# Patient Record
Sex: Male | Born: 1955
Health system: Southern US, Community
[De-identification: ages and names within clinical notes are randomized; demographics above are authoritative.]

## PROBLEM LIST (undated history)

## (undated) DIAGNOSIS — K219 Gastro-esophageal reflux disease without esophagitis: Secondary | ICD-10-CM

## (undated) DIAGNOSIS — J4489 Other specified chronic obstructive pulmonary disease: Secondary | ICD-10-CM

## (undated) DIAGNOSIS — IMO0002 Reserved for concepts with insufficient information to code with codable children: Secondary | ICD-10-CM

## (undated) DIAGNOSIS — E109 Type 1 diabetes mellitus without complications: Secondary | ICD-10-CM

## (undated) DIAGNOSIS — G8929 Other chronic pain: Secondary | ICD-10-CM

## (undated) DIAGNOSIS — I480 Paroxysmal atrial fibrillation: Secondary | ICD-10-CM

## (undated) DIAGNOSIS — Z8709 Personal history of other diseases of the respiratory system: Secondary | ICD-10-CM

## (undated) DIAGNOSIS — J449 Chronic obstructive pulmonary disease, unspecified: Secondary | ICD-10-CM

## (undated) DIAGNOSIS — I341 Nonrheumatic mitral (valve) prolapse: Secondary | ICD-10-CM

## (undated) DIAGNOSIS — R058 Other specified cough: Secondary | ICD-10-CM

## (undated) DIAGNOSIS — M1990 Unspecified osteoarthritis, unspecified site: Secondary | ICD-10-CM

## (undated) DIAGNOSIS — C679 Malignant neoplasm of bladder, unspecified: Secondary | ICD-10-CM

## (undated) DIAGNOSIS — H9190 Unspecified hearing loss, unspecified ear: Secondary | ICD-10-CM

## (undated) DIAGNOSIS — E11319 Type 2 diabetes mellitus with unspecified diabetic retinopathy without macular edema: Secondary | ICD-10-CM

## (undated) DIAGNOSIS — Z862 Personal history of diseases of the blood and blood-forming organs and certain disorders involving the immune mechanism: Secondary | ICD-10-CM

## (undated) DIAGNOSIS — I10 Essential (primary) hypertension: Secondary | ICD-10-CM

## (undated) DIAGNOSIS — J410 Simple chronic bronchitis: Secondary | ICD-10-CM

## (undated) DIAGNOSIS — M549 Dorsalgia, unspecified: Secondary | ICD-10-CM

## (undated) DIAGNOSIS — K275 Chronic or unspecified peptic ulcer, site unspecified, with perforation: Secondary | ICD-10-CM

## (undated) DIAGNOSIS — J439 Emphysema, unspecified: Secondary | ICD-10-CM

## (undated) DIAGNOSIS — Z8679 Personal history of other diseases of the circulatory system: Secondary | ICD-10-CM

## (undated) DIAGNOSIS — R05 Cough: Secondary | ICD-10-CM

## (undated) DIAGNOSIS — I739 Peripheral vascular disease, unspecified: Secondary | ICD-10-CM

## (undated) HISTORY — DX: Chronic or unspecified peptic ulcer, site unspecified, with perforation: K27.5

## (undated) HISTORY — DX: Emphysema, unspecified: J43.9

## (undated) HISTORY — DX: Other specified chronic obstructive pulmonary disease: J44.89

## (undated) HISTORY — DX: Essential (primary) hypertension: I10

## (undated) HISTORY — DX: Nonrheumatic mitral (valve) prolapse: I34.1

## (undated) HISTORY — DX: Paroxysmal atrial fibrillation: I48.0

## (undated) HISTORY — PX: TYMPANIC MEMBRANE REPAIR: SHX294

## (undated) HISTORY — PX: TONSILLECTOMY: SUR1361

---
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
## 1998-03-24 ENCOUNTER — Inpatient Hospital Stay (HOSPITAL_COMMUNITY): Admission: EM | Admit: 1998-03-24 | Discharge: 1998-03-27 | Payer: Self-pay | Admitting: Emergency Medicine

## 1998-04-05 ENCOUNTER — Encounter: Admission: RE | Admit: 1998-04-05 | Discharge: 1998-04-05 | Payer: Self-pay | Admitting: Family Medicine

## 1999-04-19 ENCOUNTER — Inpatient Hospital Stay (HOSPITAL_COMMUNITY): Admission: EM | Admit: 1999-04-19 | Discharge: 1999-04-21 | Payer: Self-pay | Admitting: Emergency Medicine

## 1999-04-19 ENCOUNTER — Encounter: Payer: Self-pay | Admitting: *Deleted

## 1999-04-20 ENCOUNTER — Encounter: Payer: Self-pay | Admitting: Sports Medicine

## 1999-07-15 ENCOUNTER — Encounter: Payer: Self-pay | Admitting: Emergency Medicine

## 1999-07-15 ENCOUNTER — Emergency Department (HOSPITAL_COMMUNITY): Admission: EM | Admit: 1999-07-15 | Discharge: 1999-07-15 | Payer: Self-pay | Admitting: Emergency Medicine

## 2000-05-12 ENCOUNTER — Encounter: Payer: Self-pay | Admitting: Emergency Medicine

## 2000-05-13 ENCOUNTER — Inpatient Hospital Stay (HOSPITAL_COMMUNITY): Admission: EM | Admit: 2000-05-13 | Discharge: 2000-05-16 | Payer: Self-pay | Admitting: Emergency Medicine

## 2000-05-15 ENCOUNTER — Encounter: Payer: Self-pay | Admitting: Sports Medicine

## 2000-10-17 ENCOUNTER — Emergency Department (HOSPITAL_COMMUNITY): Admission: EM | Admit: 2000-10-17 | Discharge: 2000-10-17 | Payer: Self-pay | Admitting: Emergency Medicine

## 2000-10-17 ENCOUNTER — Encounter: Payer: Self-pay | Admitting: Emergency Medicine

## 2001-05-08 ENCOUNTER — Emergency Department (HOSPITAL_COMMUNITY): Admission: EM | Admit: 2001-05-08 | Discharge: 2001-05-09 | Payer: Self-pay | Admitting: Emergency Medicine

## 2001-07-24 ENCOUNTER — Emergency Department (HOSPITAL_COMMUNITY): Admission: EM | Admit: 2001-07-24 | Discharge: 2001-07-24 | Payer: Self-pay | Admitting: Emergency Medicine

## 2001-07-24 ENCOUNTER — Encounter: Payer: Self-pay | Admitting: Emergency Medicine

## 2003-04-17 ENCOUNTER — Emergency Department (HOSPITAL_COMMUNITY): Admission: AD | Admit: 2003-04-17 | Discharge: 2003-04-17 | Payer: Self-pay | Admitting: Emergency Medicine

## 2003-04-18 ENCOUNTER — Inpatient Hospital Stay (HOSPITAL_COMMUNITY): Admission: EM | Admit: 2003-04-18 | Discharge: 2003-04-19 | Payer: Self-pay | Admitting: Emergency Medicine

## 2009-04-27 ENCOUNTER — Observation Stay (HOSPITAL_COMMUNITY): Admission: EM | Admit: 2009-04-27 | Discharge: 2009-04-29 | Payer: Self-pay | Admitting: Emergency Medicine

## 2010-05-30 LAB — COMPREHENSIVE METABOLIC PANEL
ALT: 12 U/L (ref 0–53)
ALT: 14 U/L (ref 0–53)
AST: 20 U/L (ref 0–37)
Albumin: 2.9 g/dL — ABNORMAL LOW (ref 3.5–5.2)
Albumin: 3 g/dL — ABNORMAL LOW (ref 3.5–5.2)
Alkaline Phosphatase: 57 U/L (ref 39–117)
Alkaline Phosphatase: 61 U/L (ref 39–117)
BUN: 10 mg/dL (ref 6–23)
BUN: 11 mg/dL (ref 6–23)
CO2: 23 mEq/L (ref 19–32)
Calcium: 7.5 mg/dL — ABNORMAL LOW (ref 8.4–10.5)
Chloride: 103 mEq/L (ref 96–112)
Chloride: 105 mEq/L (ref 96–112)
Creatinine, Ser: 0.86 mg/dL (ref 0.4–1.5)
Creatinine, Ser: 0.97 mg/dL (ref 0.4–1.5)
GFR calc Af Amer: >60 mL/min (ref >60)
GFR calc non Af Amer: >60 mL/min (ref >60)
Glucose, Bld: 119 mg/dL — ABNORMAL HIGH (ref 70–99)
Glucose, Bld: 266 mg/dL — ABNORMAL HIGH (ref 70–99)
Potassium: 3.6 mEq/L (ref 3.5–5.1)
Potassium: 4.2 mEq/L (ref 3.5–5.1)
Sodium: 129 mEq/L — ABNORMAL LOW (ref 135–145)
Sodium: 134 mEq/L — ABNORMAL LOW (ref 135–145)
Total Bilirubin: 0.7 mg/dL (ref 0.3–1.2)
Total Bilirubin: 0.8 mg/dL (ref 0.3–1.2)

## 2010-05-30 LAB — GLUCOSE, CAPILLARY
Glucose-Capillary: 136 mg/dL — ABNORMAL HIGH (ref 70–99)
Glucose-Capillary: 171 mg/dL — ABNORMAL HIGH (ref 70–99)
Glucose-Capillary: 203 mg/dL — ABNORMAL HIGH (ref 70–99)
Glucose-Capillary: 216 mg/dL — ABNORMAL HIGH (ref 70–99)
Glucose-Capillary: 227 mg/dL — ABNORMAL HIGH (ref 70–99)
Glucose-Capillary: 246 mg/dL — ABNORMAL HIGH (ref 70–99)
Glucose-Capillary: 270 mg/dL — ABNORMAL HIGH (ref 70–99)
Glucose-Capillary: 277 mg/dL — ABNORMAL HIGH (ref 70–99)
Glucose-Capillary: 338 mg/dL — ABNORMAL HIGH (ref 70–99)

## 2010-05-30 LAB — BASIC METABOLIC PANEL
BUN: 11 mg/dL (ref 6–23)
CO2: 19 mEq/L (ref 19–32)
Chloride: 103 mEq/L (ref 96–112)
Creatinine, Ser: 0.88 mg/dL (ref 0.4–1.5)
Glucose, Bld: 249 mg/dL — ABNORMAL HIGH (ref 70–99)
Potassium: 4.1 mEq/L (ref 3.5–5.1)

## 2010-05-30 LAB — URINALYSIS, ROUTINE W REFLEX MICROSCOPIC
Nitrite: NEGATIVE
Protein, ur: NEGATIVE mg/dL
Specific Gravity, Urine: 1.022 (ref 1.005–1.030)
Urobilinogen, UA: 0.2 mg/dL (ref 0.0–1.0)

## 2010-05-30 LAB — URINE MICROSCOPIC-ADD ON

## 2010-05-30 LAB — CBC
HCT: 39.6 % (ref 39.0–52.0)
MCHC: 35.5 g/dL (ref 30.0–36.0)
MCV: 92.4 fL (ref 78.0–100.0)
Platelets: 146 10*3/uL — ABNORMAL LOW (ref 150–400)
RDW: 14.1 % (ref 11.5–15.5)

## 2010-05-30 LAB — POCT I-STAT, CHEM 8
BUN: 15 mg/dL (ref 6–23)
Calcium, Ion: 1.08 mmol/L — ABNORMAL LOW (ref 1.12–1.32)
Chloride: 104 mEq/L (ref 96–112)
Creatinine, Ser: 1 mg/dL (ref 0.4–1.5)

## 2010-05-30 LAB — URINE DRUGS OF ABUSE SCREEN W ALC, ROUTINE (REF LAB)
Barbiturate Quant, Ur: NEGATIVE
Benzodiazepines.: NEGATIVE
Ethyl Alcohol: 5 mg/dL (ref <10)
Opiate Screen, Urine: NEGATIVE
Phencyclidine (PCP): NEGATIVE
Propoxyphene: NEGATIVE

## 2010-05-30 LAB — HEMOGLOBIN A1C: Hgb A1c MFr Bld: 7.9 % — ABNORMAL HIGH (ref 4.6–6.1)

## 2010-05-30 LAB — KETONES, QUALITATIVE

## 2010-05-30 LAB — RAPID STREP SCREEN (MED CTR MEBANE ONLY): Streptococcus, Group A Screen (Direct): NEGATIVE

## 2010-07-25 NOTE — Discharge Summary (Signed)
[place]. [facility]  Patient:    Thomas Sandoval, Thomas Sandoval                    MRN: [id number] Adm. Date:  [id number] Disc. Date: [id number] Attending:  Garnette Scheuermann Dictator:   Lyndee Leo. Janey Greaser, M.D. CC:         Dr. Ilean Skill                           Discharge Summary  ADMITTING DIAGNOSES: 1. Bronchitis. 2. Dehydration. 3. Diabetes mellitus. 4. Glucosuria.  HOSPITAL COURSE:  The patient was admitted and started on IV fluids and given aggressive diuresis.  The patient was also continued on his Biaxin which he had  initially received from his primary.  As an outpatient, he had been on it for the last five days.  The patient slowly did better over hospital course.  He had a chest x-ray which revealed some mild cardiac enlargement and mild vascular congestion with no edema or infiltrate.  The patient had very high CBGs over the hospital course ranging in the 300-400 range and was placed on sliding scale insulin.  The patient did have seem to have very tight control of his blood sugars at home.  The patient also had an x-ray performed of his left foot which revealed a fracture of the fifth toe specifically the proximal phalanx.  Labs over hospital course were within normal range with exception of the glucose.  The patient at o time revealed any signs of acidosis and CO2 remained within normal limits.  The  patient was also placed on a nicotine patch while in house for his tobacco abuse. The patient left before diabetic counseling could be performed as well as any appointments for outpatient clinic.  DISCHARGE DIAGNOSES: 1. Diabetes. 2. Dehydration. 3. Bronchitis/chronic obstructive pulmonary disease exacerbation.  FOLLOWUP:  The patient needs to be seen by a primary physician for continued management of his diabetes as well as a recheck from hospitalization.  However, the patient left before appointments were able to be made and before  diabetic counseling could be performed.  We have called the primary physicians office and left a message to left Dr. Ilean Skill know that he left early. DD:  04/21/99 TD:  04/22/99 Job: [id number]

## 2010-07-25 NOTE — Discharge Summary (Signed)
NAME:  Thomas Sandoval, BERTZ                       ACCOUNT NO.:  [id number]   MEDICAL RECORD NO.:  [id number]                   PATIENT TYPE:  INP   LOCATION:  6731                                 FACILITY:  [facility]   PHYSICIAN:  Leighton Roach McDiarmid, M.D.             DATE OF BIRTH:  Oct 11, 1955   DATE OF ADMISSION:  04/17/2003  DATE OF DISCHARGE:  04/19/2003                                 DISCHARGE SUMMARY   PRIMARY CARE PHYSICIAN:  Dr. Mamie Laurel at [facility].   DISCHARGE DIAGNOSES:  1. Type 2 diabetes mellitus.  2. Chronic obstructive pulmonary disease exacerbation.  3. Tobacco abuse.  4. Resolving pneumonia.  5. Dehydration.   CONSULTS:  No consults during this hospitalization.   PROCEDURE:  Chest x-ray on April 18, 2003 showed chronic changes, no  definite active process.   DISCHARGE INSTRUCTIONS:  MEDICATIONS:  1. Avelox 400 mg one p.o. q.d. for a total of 7-day course. This was started     with the PCP.  2. Prilosec OTC 20 mg one p.o. q.d., especially when taking Motrin.  3. Motrin 600 mg one p.o. q.6h. p.r.n. for pain.  4. Insulin Humulin Lente 44 units q.a.m. as taken before, Humulin Regular on     a sliding scale. The patient is to talk with Murlean Hark about changing     to Lantus insulin or other changes to his insulin regimen.  5. Albuterol inhaler 2 puffs q.6h. for 3 days, then 2 puffs q.2h. p.r.n. for     shortness of breath.  6. Atrovent MDI with spacer q.6h. for 3 days.  7. Robitussin cough syrup p.r.n.   DIET:  He is to limit carbohydrates.   SPECIAL INSTRUCTIONS:  He is to stop smoking.   FOLLOW UP:  The patient is to call [facility] for  appointment in two weeks post discharge.   BRIEF HISTORY AND HOSPITAL COURSE:  1. Thomas Sandoval is a 55 year old white male with known IDDM who presents to     the ED for the second time in 24 hours with complaint of vomiting, not     relieved by Phenergan and IV fluids. He is  undergoing current treatment     for pneumonia diagnosed by his primary care physician two days prior to     admission and treated with Avelox. He was admitted for dehydration     secondary to his intractable nausea and vomiting. His laboratory values     with a creatinine of 1.2 increased from baseline showed a prerenal     picture, nausea and vomiting; therefore dehydration resolved with Zofran     as well as IV fluid, and in 6 to 8 hours, he was taking p.o. without     difficulty.  2. Pneumonia. Diagnosed per history. Records from PCP showed question of a     nodule versus atelectasis at the right lung base for which recommended  a     repeat x-ray. He was placed on Avelox and continued on that in the     hospital and also symptomatic treatment. He remained afebrile but did     have a slight increased white blood cell count at 11.4 with 92%     neutrophils.  3. Insulin-dependent diabetes mellitus. The patient has history of very poor     compliance as well as extreme resistance to change in his current     regimen. He is rarely if ever been admitted with DKA, and on admission,     he did have positive glycosuria but was not acidotic with a CBG of 216     after not taking his insulin for two days. He had not been eating. Did     have mild urinary ketones of greater than 80. Hemoglobin A1c was 7.4. His     home regimen was began on hospital day #2, and also discussed at length,     his regimen was not changed as this was felt to be better done by the     primary care physician who has obviously discussed this with him at     length in the past. His C peptide incidentally was less than 0.1 which     does not explain his lack of episodes of DKA or complications.  4. Tobacco abuse. Patient with long history of smoking. Discussed at length     his need to quit. He is very resistant and will surely discuss this     further with his PCP.  5. Chronic obstructive pulmonary disease exacerbation.  Admission O2     saturations 93% on room air with wheezes and rhonchi on exam. Although     chest x-ray was negative, he was placed on pulmonary toilet with     albuterol and Atrovent metered dose inhalers, and by hospital day #2, he     was improving, continued on Avelox, and was not began on oral steroids.     On discharge, the patient was stable, afebrile, saturating 95% greater on     room air, taking p.o. without nausea or vomiting, continued with cough     but felt much improved and stable to go home. Therefore was discharged.   DISCHARGE LABORATORY DATA:  WBC 8.8, hemoglobin 12.9, hematocrit 37.9,  platelets 203. Sodium 141, potassium 3.5, chloride 109, CO2 26, BUN 17,  creatinine 1.0, glucose 65, calcium 8.7.      Ace Gins, MD                        Etta Grandchild, M.D.    JS/MEDQ  D:  06/14/2003  T:  06/15/2003  Job:  [id number]   cc:   [facility]

## 2010-07-25 NOTE — Discharge Summary (Signed)
[place]. [facility]  Patient:    Thomas Sandoval, Thomas Sandoval                    MRN: [id number] Adm. Date:  [id number] Disc. Date: [id number] Attending:  Garnette Scheuermann Dictator:   Thomas Sandoval, M.D. CC:         Dr. Christell Constant and Birdena Jubilee, [place] [facility]   Discharge Summary  ADMITTING DIAGNOSES: 1. Viral gastroenteritis. 2. Acute bronchitis. 3. Hyperglycemia.  DISCHARGE DIAGNOSES: 1. Viral gastroenteritis. 2. Acute bronchitis. 3. Hyperglycemia.  CHRONIC PROBLEM LIST: 1. Diabetes mellitus, type 1. 2. Chronic obstructive pulmonary disease (chronic bronchitis).  DISCHARGE MEDICATIONS: 1. Tequin 400 mg by mouth daily for 5 days. 2. Phenergan 12.5 mg by mouth every 6 hours as needed for nausea. 3. Guaifenesin 600 mg by mouth b.i.d. as needed for cough. 4. Insulin as per home regimen prior to admission.  SERVICE:  Conservation officer, historic buildings.  RESIDENT:  Dr. Lenard Galloway.  CONSULTS:  None.  PROCEDURES:  None.  HISTORY AND PHYSICAL:  For complete H&P, please see resident H&P in chart. Briefly, this was a 55 year old white man with diagnosis of type 1 diabetes mellitus, who presented with approximately one day of nausea and vomiting and inability to take anything by mouth.  He had had several members of his family with the same illness over the last week.  He apparently controls his diabetes with approximately 45 units of NPH in the morning and adjusts his regular insulin dose throughout the day according to his activity level.  He also takes 10 units of Lenta, also according to his activity level.  He does not get regular follow up with a doctor but has been admitted for DKA.  PRESENTATION LABORATORY DATA:  Sodium 139, potassium 3.9, chloride 104, bicarb 29, BUN 11, creatinine 0.9, glucose 229, hemoglobin 14.  Serum ketones were negative.  Urinalysis showed greater than 1000 glucose, greater than 80 ketones, and was  otherwise negative.  Chest x-ray showed stable coarse interstitial lung disease.  This 55 year old male with type 1 diabetes was admitted for probable viral gastroenteritis and IV rehydration.  HOSPITAL COURSE: #1 - GASTROENTERITIS:  The patient continued to have severe nausea and vomiting throughout the first 1-2 hospital days.  He received aggressive IV fluid rehydration with normal saline.  On the second hospital day, he did have several loose stools but did not have any frank diarrhea throughout the hospitalization.  He was treated with Phenergan IV for nausea, but this seemed to sedate him too much, so this was changed to Vistaril.  The patient did begin to gradually be able to take in fluids by mouth without emesis and on the second to last hospital day, began taking a regular diet without problem. His energy level was markedly increased, and he desired to go home.  #2 - DIABETES MELLITUS, TYPE 1:  The patient has no history of DKA.  His hemoglobin A1C was not known, but he reports that the last one taken was not very good because "I was sick."  Glucose at presentation was 229 and bicarb was 29.  Serum ketones were negative.  Serum ketones were checked daily throughout this hospitalization and were positive on the third hospital day, but the bicarb did not get low.  He was controlled with sliding scale insulin and then briefly was placed on regular scheduled insulin at 10 units of NPH b.i.d.  His blood sugars ranged from 129 to 280 during  hospitalization.  He was discharged home with instructions to have close follow-up with the nurse practitioner, Birdena Jubilee, at [facility], as this is the person that is familiar with his diabetes management in the past, although this has been infrequent.  He said that he would get an appointment with her within a week of discharge.  It was also recommended that he start an aspirin per day and also be started on an ACE  inhibitor in the future.  #3 - ACUTE BRONCHITIS:  Upon presentation, the patient did have a significant productive cough and was febrile.  The fever was attributed to his viral gastroenteritis, but he did have a lung exam consistent with acute bronchitis during this hospitalization and was started on Tequin 400 mg by mouth daily. A chest x-ray on the third hospital day did not show any significant changes from admission chest x-ray.  He was discharged home on Tequin to finish a 7-day course of antibiotics.  DISPOSITION:  The patient was discharged home in stable and improved condition and on the previously mentioned discharge medications.  He was instructed to follow up with [facility] with Dr. Christell Constant or with Birdena Jubilee within the next week.  He was also instructed to do his best to quit smoking and to have closer follow-up with physicians or nurse practitioners regarding management of his type 1 diabetes. DD:  05/16/00 TD:  05/17/00 Job: [id number]

## 2011-02-07 DIAGNOSIS — Z862 Personal history of diseases of the blood and blood-forming organs and certain disorders involving the immune mechanism: Secondary | ICD-10-CM

## 2011-02-07 DIAGNOSIS — Z8679 Personal history of other diseases of the circulatory system: Secondary | ICD-10-CM

## 2011-02-07 HISTORY — DX: Personal history of diseases of the blood and blood-forming organs and certain disorders involving the immune mechanism: Z86.2

## 2011-02-07 HISTORY — DX: Personal history of other diseases of the circulatory system: Z86.79

## 2011-02-15 ENCOUNTER — Inpatient Hospital Stay (HOSPITAL_COMMUNITY)
Admission: EM | Admit: 2011-02-15 | Discharge: 2011-02-24 | Discharge status: Against Medical Advice | Payer: BC Managed Care – PPO | Source: Home / Self Care | Attending: Cardiology | Admitting: Cardiology

## 2011-02-15 ENCOUNTER — Emergency Department (HOSPITAL_COMMUNITY): Payer: BC Managed Care – PPO

## 2011-02-15 ENCOUNTER — Other Ambulatory Visit: Payer: Self-pay

## 2011-02-15 ENCOUNTER — Encounter: Payer: Self-pay | Admitting: Emergency Medicine

## 2011-02-15 DIAGNOSIS — J449 Chronic obstructive pulmonary disease, unspecified: Secondary | ICD-10-CM | POA: Diagnosis present

## 2011-02-15 DIAGNOSIS — J111 Influenza due to unidentified influenza virus with other respiratory manifestations: Secondary | ICD-10-CM | POA: Diagnosis not present

## 2011-02-15 DIAGNOSIS — E10649 Type 1 diabetes mellitus with hypoglycemia without coma: Secondary | ICD-10-CM

## 2011-02-15 DIAGNOSIS — J8410 Pulmonary fibrosis, unspecified: Secondary | ICD-10-CM | POA: Diagnosis present

## 2011-02-15 DIAGNOSIS — IMO0001 Reserved for inherently not codable concepts without codable children: Secondary | ICD-10-CM | POA: Diagnosis present

## 2011-02-15 DIAGNOSIS — B370 Candidal stomatitis: Secondary | ICD-10-CM | POA: Diagnosis not present

## 2011-02-15 DIAGNOSIS — I4892 Unspecified atrial flutter: Principal | ICD-10-CM | POA: Diagnosis present

## 2011-02-15 DIAGNOSIS — J4489 Other specified chronic obstructive pulmonary disease: Secondary | ICD-10-CM | POA: Diagnosis present

## 2011-02-15 DIAGNOSIS — F17200 Nicotine dependence, unspecified, uncomplicated: Secondary | ICD-10-CM | POA: Diagnosis present

## 2011-02-15 DIAGNOSIS — E871 Hypo-osmolality and hyponatremia: Secondary | ICD-10-CM | POA: Diagnosis not present

## 2011-02-15 DIAGNOSIS — J441 Chronic obstructive pulmonary disease with (acute) exacerbation: Secondary | ICD-10-CM

## 2011-02-15 DIAGNOSIS — D559 Anemia due to enzyme disorder, unspecified: Secondary | ICD-10-CM | POA: Diagnosis present

## 2011-02-15 DIAGNOSIS — E1010 Type 1 diabetes mellitus with ketoacidosis without coma: Secondary | ICD-10-CM | POA: Diagnosis not present

## 2011-02-15 DIAGNOSIS — R1110 Vomiting, unspecified: Secondary | ICD-10-CM | POA: Diagnosis present

## 2011-02-15 DIAGNOSIS — J154 Pneumonia due to other streptococci: Secondary | ICD-10-CM | POA: Diagnosis present

## 2011-02-15 DIAGNOSIS — E119 Type 2 diabetes mellitus without complications: Secondary | ICD-10-CM

## 2011-02-15 DIAGNOSIS — Z794 Long term (current) use of insulin: Secondary | ICD-10-CM

## 2011-02-15 DIAGNOSIS — E876 Hypokalemia: Secondary | ICD-10-CM | POA: Diagnosis not present

## 2011-02-15 DIAGNOSIS — J069 Acute upper respiratory infection, unspecified: Secondary | ICD-10-CM

## 2011-02-15 DIAGNOSIS — J13 Pneumonia due to Streptococcus pneumoniae: Secondary | ICD-10-CM | POA: Diagnosis present

## 2011-02-15 DIAGNOSIS — IMO0002 Reserved for concepts with insufficient information to code with codable children: Secondary | ICD-10-CM

## 2011-02-15 DIAGNOSIS — R0902 Hypoxemia: Secondary | ICD-10-CM

## 2011-02-15 DIAGNOSIS — D649 Anemia, unspecified: Secondary | ICD-10-CM

## 2011-02-15 DIAGNOSIS — E8779 Other fluid overload: Secondary | ICD-10-CM | POA: Diagnosis not present

## 2011-02-15 DIAGNOSIS — I484 Atypical atrial flutter: Secondary | ICD-10-CM

## 2011-02-15 DIAGNOSIS — J3489 Other specified disorders of nose and nasal sinuses: Secondary | ICD-10-CM | POA: Diagnosis present

## 2011-02-15 LAB — BASIC METABOLIC PANEL
CO2: 20 mEq/L (ref 19–32)
Chloride: 100 mEq/L (ref 96–112)
Creatinine, Ser: 0.95 mg/dL (ref 0.50–1.35)
GFR calc Af Amer: >90 mL/min (ref >90)
Potassium: 4.1 mEq/L (ref 3.5–5.1)
Sodium: 134 mEq/L — ABNORMAL LOW (ref 135–145)

## 2011-02-15 LAB — CBC
Platelets: 197 10*3/uL (ref 150–400)
RBC: 4.84 MIL/uL (ref 4.22–5.81)
RDW: 13.1 % (ref 11.5–15.5)
WBC: 8.8 10*3/uL (ref 4.0–10.5)

## 2011-02-15 MED ORDER — ONDANSETRON HCL 8 MG PO TABS: 8.0000 mg | ORAL_TABLET | Freq: Three times a day (TID) | ORAL | Status: AC | PRN

## 2011-02-15 MED ORDER — IPRATROPIUM BROMIDE 0.02 % IN SOLN
0.5000 mg | Freq: Once | RESPIRATORY_TRACT | Status: AC
  Administered 2011-02-15: 0.5 mg via RESPIRATORY_TRACT
  Filled 2011-02-15: qty 2.5

## 2011-02-15 MED ORDER — ONDANSETRON HCL 4 MG/2ML IJ SOLN
4.0000 mg | Freq: Once | INTRAMUSCULAR | Status: AC
  Administered 2011-02-15: 4 mg via INTRAVENOUS
  Filled 2011-02-15: qty 2

## 2011-02-15 MED ORDER — HYDROCOD POLST-CHLORPHEN POLST 10-8 MG/5ML PO LQCR
5.0000 mL | Freq: Once | ORAL | Status: AC
  Administered 2011-02-15: 5 mL via ORAL
  Filled 2011-02-15: qty 5

## 2011-02-15 MED ORDER — SODIUM CHLORIDE 0.9 % IV BOLUS (SEPSIS)
1000.0000 mL | Freq: Once | INTRAVENOUS | Status: AC
  Administered 2011-02-15: 1000 mL via INTRAVENOUS

## 2011-02-15 MED ORDER — METOCLOPRAMIDE HCL 5 MG/ML IJ SOLN
10.0000 mg | Freq: Once | INTRAMUSCULAR | Status: AC
  Administered 2011-02-15: 10 mg via INTRAVENOUS
  Filled 2011-02-15: qty 2

## 2011-02-15 MED ORDER — ALBUTEROL SULFATE (5 MG/ML) 0.5% IN NEBU
5.0000 mg | INHALATION_SOLUTION | Freq: Once | RESPIRATORY_TRACT | Status: AC
  Administered 2011-02-15: 5 mg via RESPIRATORY_TRACT
  Filled 2011-02-15: qty 1

## 2011-02-15 MED ORDER — ALBUTEROL SULFATE HFA 108 (90 BASE) MCG/ACT IN AERS: 2.0000 | INHALATION_SPRAY | RESPIRATORY_TRACT | Status: DC | PRN

## 2011-02-15 NOTE — ED Notes (Signed)
Patient states yesterday had diarrhea but it stopped today today woke up vomiting that was unable to control with home remedies

## 2011-02-15 NOTE — ED Provider Notes (Signed)
History     CSN: [id number] Arrival date & time: 02/15/2011  4:54 PM   First MD Initiated Contact with Patient 02/15/11 1735      Chief Complaint  Patient presents with  . Emesis    (Consider location/radiation/quality/duration/timing/severity/associated sxs/prior treatment) Patient is a 55 y.o. male presenting with vomiting. The history is provided by the patient.  Emesis  Associated symptoms include a fever. Pertinent negatives include no abdominal pain and no headaches.  pt hx iddm, ??copd, c/o prod cough, fever, and nv onset 2-3 days ago. Acute onset, episodic, no specific exacerbating or allev factors. ?hx copd. Smoker. Uses inhaler prn. No prior admit re copd/asthma. +nasal congestion. No sinus pain. No headache. +body aches. No known ill contacts. Sob/wheezing. No chest pain. Scratchy throat, no trouble swallowing. States hasnt kept anything down all day. Did have bm today, sl loose. No abd pain. +fever. No chills/sweats.   Past Medical History  Diagnosis Date  . Diabetes mellitus     History reviewed. No pertinent past surgical history.  No family history on file.  History  Substance Use Topics  . Smoking status: Current Everyday Smoker  . Smokeless tobacco: Not on file  . Alcohol Use: No      Review of Systems  Constitutional: Positive for fever.  HENT: Negative for neck pain and neck stiffness.   Eyes: Negative for redness.  Respiratory: Positive for shortness of breath and wheezing.   Cardiovascular: Negative for chest pain and leg swelling.  Gastrointestinal: Positive for vomiting. Negative for abdominal pain.  Genitourinary: Negative for flank pain.  Musculoskeletal: Negative for back pain.  Skin: Negative for rash.  Neurological: Negative for headaches.  Hematological: Does not bruise/bleed easily.  Psychiatric/Behavioral: Negative for confusion.    Allergies  Review of patient's allergies indicates no known allergies.  Home Medications   Current  Outpatient Rx  Name Route Sig Dispense Refill  . FLUTICASONE-SALMETEROL 250-50 MCG/DOSE IN AEPB Inhalation Inhale 1 puff into the lungs every 12 (twelve) hours.      . INSULIN ASPART 100 UNIT/ML [place] SOLN Subcutaneous Inject 2-8 Units into the skin 3 (three) times daily before meals. Take as directed per sliding scale     . INSULIN GLARGINE 100 UNIT/ML  SOLN Subcutaneous Inject 32 Units into the skin at bedtime.        BP 125/60  Pulse 96  Temp(Src) 97.8 F (36.6 C) (Oral)  Resp 18  SpO2 96%  Physical Exam  Nursing note and vitals reviewed. Constitutional: He is oriented to person, place, and time. He appears well-developed and well-nourished. No distress.  HENT:  Head: Atraumatic.  Nose: Nose normal.  Mouth/Throat: Oropharynx is clear and moist.  Eyes: Pupils are equal, round, and reactive to light.  Neck: Neck supple. No tracheal deviation present.       No stiffness or rigidity  Cardiovascular: Normal rate, regular rhythm and intact distal pulses.  Exam reveals no gallop and no friction rub.   No murmur heard. Pulmonary/Chest: Effort normal. No accessory muscle usage. No respiratory distress. He has wheezes.  Abdominal: Soft. Bowel sounds are normal. He exhibits no distension and no mass. There is no tenderness. There is no rebound and no guarding.  Musculoskeletal: Normal range of motion. He exhibits no edema and no tenderness.  Neurological: He is alert and oriented to person, place, and time.  Skin: Skin is warm and dry.  Psychiatric: He has a normal mood and affect.    ED Course  Procedures (including  critical care time)  Results for orders placed during the hospital encounter of 02/15/11  CBC      Component Value Range   WBC 8.8  4.0 - 10.5 (K/uL)   RBC 4.84  4.22 - 5.81 (MIL/uL)   Hemoglobin 14.6  13.0 - 17.0 (g/dL)   HCT 16.1  09.6 - 04.5 (%)   MCV 90.9  78.0 - 100.0 (fL)   MCH 30.2  26.0 - 34.0 (pg)   MCHC 33.2  30.0 - 36.0 (g/dL)   RDW 40.9  81.1 - 91.4 (%)     Platelets 197  150 - 400 (K/uL)  BASIC METABOLIC PANEL      Component Value Range   Sodium 134 (*) 135 - 145 (mEq/L)   Potassium 4.1  3.5 - 5.1 (mEq/L)   Chloride 100  96 - 112 (mEq/L)   CO2 20  19 - 32 (mEq/L)   Glucose, Bld 213 (*) 70 - 99 (mg/dL)   BUN 9  6 - 23 (mg/dL)   Creatinine, Ser 7.82  0.50 - 1.35 (mg/dL)   Calcium 9.3  8.4 - 95.6 (mg/dL)   GFR calc non Af Amer >90  >90 (mL/min)   GFR calc Af Amer >90  >90 (mL/min)   Dg Chest 2 View  02/15/2011  *RADIOLOGY REPORT*  Clinical Data: Cough and fever.  Shortness of breath.  Nausea vomiting.  CHEST - 2 VIEW  Comparison:  04/27/2009  Findings:  The heart size and mediastinal contours are within normal limits.  Both lungs are clear.  The visualized skeletal structures are unremarkable.  IMPRESSION: No active cardiopulmonary disease.  Original Report Authenticated By: Danae Orleans, M.D.       MDM  Iv ns bolus. zofran iv. Albuterol and atrovent neb tx. Labs. Cxr.   Pt w recurret nv. Clear. Wheezing improved w neb tx. No increased wob. Recheck abd soft nt. zofran iv. Additional ivf. Repeat vitals, response to po fluids/fluid challenge pending. Signed out to PA Dammen/MD Plunkett that if improved, tolerating po fluids, may d/c w probable viral illness/flu, close pcp follow up.  If recurrent/persistent nv, or sob/inc wheezing, would consider admission.        Suzi Roots, MD 02/15/11 2006

## 2011-02-15 NOTE — ED Notes (Signed)
C/o nausea, vomiting, headache, productive cough with clear phlegm, generalized body aches, sore throat, and chills x 3-4 days.

## 2011-02-16 ENCOUNTER — Other Ambulatory Visit: Payer: Self-pay

## 2011-02-16 ENCOUNTER — Encounter (HOSPITAL_COMMUNITY): Payer: Self-pay | Admitting: General Practice

## 2011-02-16 DIAGNOSIS — I4892 Unspecified atrial flutter: Principal | ICD-10-CM

## 2011-02-16 DIAGNOSIS — R112 Nausea with vomiting, unspecified: Secondary | ICD-10-CM

## 2011-02-16 DIAGNOSIS — E10649 Type 1 diabetes mellitus with hypoglycemia without coma: Secondary | ICD-10-CM

## 2011-02-16 DIAGNOSIS — I484 Atypical atrial flutter: Secondary | ICD-10-CM

## 2011-02-16 DIAGNOSIS — J069 Acute upper respiratory infection, unspecified: Secondary | ICD-10-CM

## 2011-02-16 DIAGNOSIS — E119 Type 2 diabetes mellitus without complications: Secondary | ICD-10-CM

## 2011-02-16 LAB — PROTIME-INR: INR: 1.2 (ref 0.00–1.49)

## 2011-02-16 LAB — CBC
HCT: 40.9 % (ref 39.0–52.0)
Hemoglobin: 13.6 g/dL (ref 13.0–17.0)
RDW: 13.3 % (ref 11.5–15.5)
WBC: 13.5 10*3/uL — ABNORMAL HIGH (ref 4.0–10.5)

## 2011-02-16 LAB — INFLUENZA PANEL BY PCR (TYPE A & B): Influenza B By PCR: NEGATIVE

## 2011-02-16 LAB — GLUCOSE, CAPILLARY
Glucose-Capillary: 301 mg/dL — ABNORMAL HIGH (ref 70–99)
Glucose-Capillary: 367 mg/dL — ABNORMAL HIGH (ref 70–99)
Glucose-Capillary: 248 mg/dL — ABNORMAL HIGH (ref 70–99)

## 2011-02-16 LAB — CREATININE, SERUM
GFR calc Af Amer: >90 mL/min (ref >90)
GFR calc non Af Amer: >90 mL/min (ref >90)

## 2011-02-16 LAB — HEPATIC FUNCTION PANEL
ALT: 11 U/L (ref 0–53)
Bilirubin, Direct: 0.2 mg/dL (ref 0.0–0.3)
Indirect Bilirubin: 0.6 mg/dL (ref 0.3–0.9)

## 2011-02-16 LAB — POCT I-STAT TROPONIN I: Troponin i, poc: 0.08 ng/mL (ref 0.00–0.08)

## 2011-02-16 MED ORDER — ACETAMINOPHEN 325 MG PO TABS
650.0000 mg | ORAL_TABLET | Freq: Four times a day (QID) | ORAL | Status: DC | PRN
  Administered 2011-02-18 – 2011-02-22 (×3): 650 mg via ORAL
  Filled 2011-02-16 (×3): qty 2

## 2011-02-16 MED ORDER — ACETAMINOPHEN 650 MG RE SUPP: 650.0000 mg | Freq: Four times a day (QID) | RECTAL | Status: DC | PRN

## 2011-02-16 MED ORDER — HYDROCOD POLST-CHLORPHEN POLST 10-8 MG/5ML PO LQCR
5.0000 mL | Freq: Two times a day (BID) | ORAL | Status: DC | PRN
  Administered 2011-02-16 – 2011-02-19 (×3): 5 mL via ORAL
  Filled 2011-02-16 (×5): qty 5

## 2011-02-16 MED ORDER — ASPIRIN 325 MG PO TABS
325.0000 mg | ORAL_TABLET | Freq: Every day | ORAL | Status: DC
  Administered 2011-02-17 – 2011-02-21 (×5): 325 mg via ORAL
  Filled 2011-02-16 (×6): qty 1

## 2011-02-16 MED ORDER — OSELTAMIVIR PHOSPHATE 75 MG PO CAPS
75.0000 mg | ORAL_CAPSULE | Freq: Two times a day (BID) | ORAL | Status: DC
  Filled 2011-02-16 (×2): qty 1

## 2011-02-16 MED ORDER — ASPIRIN 81 MG PO CHEW
81.0000 mg | CHEWABLE_TABLET | Freq: Every day | ORAL | Status: DC
  Administered 2011-02-16: 81 mg via ORAL
  Filled 2011-02-16: qty 1

## 2011-02-16 MED ORDER — DILTIAZEM HCL 100 MG IV SOLR
5.0000 mg/h | INTRAVENOUS | Status: DC
  Administered 2011-02-17 (×2): 10 mg/h via INTRAVENOUS
  Administered 2011-02-17: 5 mg/h via INTRAVENOUS
  Administered 2011-02-18: 10 mg/h via INTRAVENOUS
  Filled 2011-02-16 (×4): qty 100

## 2011-02-16 MED ORDER — INSULIN GLARGINE 100 UNIT/ML ~~LOC~~ SOLN
30.0000 [IU] | Freq: Every day | SUBCUTANEOUS | Status: DC
  Administered 2011-02-17: 30 [IU] via SUBCUTANEOUS

## 2011-02-16 MED ORDER — INSULIN ASPART 100 UNIT/ML ~~LOC~~ SOLN
0.0000 [IU] | Freq: Three times a day (TID) | SUBCUTANEOUS | Status: DC
  Administered 2011-02-16: 7 [IU] via SUBCUTANEOUS
  Administered 2011-02-16 – 2011-02-17 (×2): 9 [IU] via SUBCUTANEOUS
  Filled 2011-02-16: qty 3

## 2011-02-16 MED ORDER — DILTIAZEM LOAD VIA INFUSION
15.0000 mg | Freq: Once | INTRAVENOUS | Status: AC
  Administered 2011-02-17: 15 mg via INTRAVENOUS
  Filled 2011-02-16: qty 15

## 2011-02-16 MED ORDER — INSULIN GLARGINE 100 UNIT/ML ~~LOC~~ SOLN
25.0000 [IU] | Freq: Every day | SUBCUTANEOUS | Status: DC
  Filled 2011-02-16: qty 3

## 2011-02-16 MED ORDER — OFF THE BEAT BOOK
Freq: Once | Status: AC
  Administered 2011-02-16: 15:00:00
  Filled 2011-02-16: qty 1

## 2011-02-16 MED ORDER — DILTIAZEM LOAD VIA INFUSION
15.0000 mg | Freq: Once | INTRAVENOUS | Status: AC
  Administered 2011-02-16: 15 mg via INTRAVENOUS
  Filled 2011-02-16: qty 15

## 2011-02-16 MED ORDER — HEPARIN SODIUM (PORCINE) 5000 UNIT/ML IJ SOLN
5000.0000 [IU] | Freq: Three times a day (TID) | INTRAMUSCULAR | Status: DC
  Administered 2011-02-16 – 2011-02-20 (×13): 5000 [IU] via SUBCUTANEOUS
  Filled 2011-02-16 (×17): qty 1

## 2011-02-16 MED ORDER — ONDANSETRON HCL 4 MG/2ML IJ SOLN
4.0000 mg | Freq: Four times a day (QID) | INTRAMUSCULAR | Status: DC | PRN
  Administered 2011-02-16 – 2011-02-19 (×5): 4 mg via INTRAVENOUS
  Filled 2011-02-16 (×6): qty 2

## 2011-02-16 MED ORDER — DILTIAZEM HCL 100 MG IV SOLR
5.0000 mg/h | INTRAVENOUS | Status: DC
  Administered 2011-02-16: 5 mg/h via INTRAVENOUS

## 2011-02-16 MED ORDER — ALBUTEROL SULFATE (5 MG/ML) 0.5% IN NEBU
2.5000 mg | INHALATION_SOLUTION | Freq: Four times a day (QID) | RESPIRATORY_TRACT | Status: DC | PRN
  Administered 2011-02-18: 2.5 mg via RESPIRATORY_TRACT
  Filled 2011-02-16: qty 0.5

## 2011-02-16 MED ORDER — ONDANSETRON HCL 4 MG PO TABS
4.0000 mg | ORAL_TABLET | Freq: Four times a day (QID) | ORAL | Status: DC | PRN
  Administered 2011-02-20: 4 mg via ORAL
  Filled 2011-02-16 (×2): qty 1

## 2011-02-16 MED ORDER — DILTIAZEM HCL 30 MG PO TABS
30.0000 mg | ORAL_TABLET | Freq: Four times a day (QID) | ORAL | Status: DC
  Administered 2011-02-16 (×3): 30 mg via ORAL
  Filled 2011-02-16 (×4): qty 1

## 2011-02-16 MED ORDER — FLUTICASONE-SALMETEROL 250-50 MCG/DOSE IN AEPB
1.0000 | INHALATION_SPRAY | Freq: Two times a day (BID) | RESPIRATORY_TRACT | Status: DC
  Administered 2011-02-16 – 2011-02-23 (×14): 1 via RESPIRATORY_TRACT
  Filled 2011-02-16: qty 14

## 2011-02-16 MED ORDER — DILTIAZEM HCL 100 MG IV SOLR
5.0000 mg/h | INTRAVENOUS | Status: AC
  Administered 2011-02-16: 5 mg/h via INTRAVENOUS
  Filled 2011-02-16: qty 100

## 2011-02-16 MED ORDER — ASPIRIN 81 MG PO CHEW
243.0000 mg | CHEWABLE_TABLET | Freq: Once | ORAL | Status: AC
  Administered 2011-02-16: 243 mg via ORAL
  Filled 2011-02-16: qty 3

## 2011-02-16 MED ORDER — SODIUM CHLORIDE 0.9 % IV SOLN
INTRAVENOUS | Status: DC
  Administered 2011-02-16 – 2011-02-17 (×3): via INTRAVENOUS

## 2011-02-16 MED ORDER — ASPIRIN 81 MG PO CHEW
324.0000 mg | CHEWABLE_TABLET | Freq: Once | ORAL | Status: AC
  Administered 2011-02-16: 324 mg via ORAL
  Filled 2011-02-16: qty 4

## 2011-02-16 MED ORDER — NITROGLYCERIN 0.4 MG SL SUBL: 0.4000 mg | SUBLINGUAL_TABLET | SUBLINGUAL | Status: DC | PRN

## 2011-02-16 NOTE — H&P (Signed)
See dictated note # [id number]

## 2011-02-16 NOTE — ED Notes (Signed)
Cards at bedside; EDPA notified and came to bedside

## 2011-02-16 NOTE — Consult Note (Signed)
Requesting physician: Dr. Riley Kill  Reason for consultation: Fever, cough, nausea, vomiting, myalgias   History of Present Illness: Patient is a pleasant 55 year old white man with a history of insulin-dependent diabetes who presented to the hospital yesterday with a several day history of fever, productive cough, nausea vomiting and mild shortness of breath. In the emergency department he was found to be tachycardic, and EKG showed evidence for atrial flutter with rapid ventricular response and cardiology was called. Cardiology proceeded to admit the patient and we are asked today to see Thomas Sandoval in consultation for management of his viral illness.  Allergies:  No Known Allergies    Past Medical History  Diagnosis Date  . Diabetes mellitus   . Tuberculosis     History reviewed. No pertinent past surgical history.  Scheduled Meds:   . albuterol  5 mg Nebulization Once  . aspirin  243 mg Oral Once  . aspirin  324 mg Oral Once  . aspirin  325 mg Oral Daily  . chlorpheniramine-HYDROcodone  5 mL Oral Once  . diltiazem  15 mg Intravenous Once  . diltiazem (CARDIZEM) infusion  5-15 mg/hr Intravenous To Major  . diltiazem  30 mg Oral Q6H  . Fluticasone-Salmeterol  1 puff Inhalation Q12H  . heparin  5,000 Units Subcutaneous Q8H  . insulin aspart  0-9 Units Subcutaneous TID WC  . insulin glargine  30 Units Subcutaneous QHS  . ipratropium  0.5 mg Nebulization Once  . metoCLOPramide (REGLAN) injection  10 mg Intravenous Once  . off the beat book   Does not apply Once  . ondansetron (ZOFRAN) IV  4 mg Intravenous Once  . ondansetron (ZOFRAN) IV  4 mg Intravenous Once  . oseltamivir  75 mg Oral BID  . sodium chloride  1,000 mL Intravenous Once  . sodium chloride  1,000 mL Intravenous Once  . DISCONTD: aspirin  81 mg Oral Daily  . DISCONTD: insulin glargine  25 Units Subcutaneous QHS   Continuous Infusions:   . sodium chloride 50 mL/hr at 02/16/11 1224  . DISCONTD: diltiazem  (CARDIZEM) infusion Stopped (02/16/11 1224)   PRN Meds:.acetaminophen, acetaminophen, albuterol, chlorpheniramine-HYDROcodone, nitroGLYCERIN, ondansetron (ZOFRAN) IV, ondansetron  Social History:  reports that he has been smoking.  He does not have any smokeless tobacco history on file. He reports that he does not drink alcohol or use illicit drugs.  No family history on file.  Review of Systems:  HEENT: Denies photophobia, eye pain, redness, hearing loss, ear pain, congestion, sore throat, rhinorrhea, sneezing, mouth sores, trouble swallowing, neck pain, neck stiffness and tinnitus.   Respiratory: Denies, chest tightness,  and wheezing.   Cardiovascular: Denies chest pain, palpitations and leg swelling.  Gastrointestinal:  constipation, blood in stool and abdominal distention.  Genitourinary: Denies dysuria, urgency, frequency, hematuria, flank pain and difficulty urinating.  Musculoskeletal: Denies myalgias, back pain, joint swelling, arthralgias and gait problem.  Skin: Denies pallor, rash and wound.  Neurological: Denies dizziness, seizures, syncope, weakness, light-headedness, numbness and headaches.  Hematological: Denies adenopathy. Easy bruising, personal or family bleeding history  Psychiatric/Behavioral: Denies suicidal ideation, mood changes, confusion, nervousness, sleep disturbance and agitation   Physical Exam: Blood pressure 155/68, pulse 84, temperature 99.6 F (37.6 C), temperature source Oral, resp. rate 20, height 5\' 10"  (1.778 m), weight 70.7 kg (155 lb 13.8 oz), SpO2 92.00%. General: Alert, awake, oriented x3, appears diaphoretic and warm to touch. HEENT: Normocephalic, atraumatic, pupils equal round and reactive to light, intact extraocular movements, dry mucous membranes. Neck: Supple,  no JVD, no lymphadenopathy, no bruits, no goiter. Cardiovascular: Currently regular rate and rhythm without murmurs, rubs or gallops. Lungs: Clear to auscultation  bilaterally. Abdomen: Soft, nontender, nondistended, positive bowel sounds, no masses or organomegaly noted. Extremities: No clubbing, cyanosis or edema, positive pedal pulses. Neurologic: Grossly intact and nonfocal.   Labs on Admission:  Results for orders placed during the hospital encounter of 02/15/11 (from the past 48 hour(s))  CBC     Status: Normal   Collection Time   02/15/11  5:44 PM      Component Value Range Comment   WBC 8.8  4.0 - 10.5 (K/uL)    RBC 4.84  4.22 - 5.81 (MIL/uL)    Hemoglobin 14.6  13.0 - 17.0 (g/dL)    HCT 46.9  62.9 - 52.8 (%)    MCV 90.9  78.0 - 100.0 (fL)    MCH 30.2  26.0 - 34.0 (pg)    MCHC 33.2  30.0 - 36.0 (g/dL)    RDW 41.3  24.4 - 01.0 (%)    Platelets 197  150 - 400 (K/uL)   BASIC METABOLIC PANEL     Status: Abnormal   Collection Time   02/15/11  5:44 PM      Component Value Range Comment   Sodium 134 (*) 135 - 145 (mEq/L)    Potassium 4.1  3.5 - 5.1 (mEq/L)    Chloride 100  96 - 112 (mEq/L)    CO2 20  19 - 32 (mEq/L)    Glucose, Bld 213 (*) 70 - 99 (mg/dL)    BUN 9  6 - 23 (mg/dL)    Creatinine, Ser 2.72  0.50 - 1.35 (mg/dL)    Calcium 9.3  8.4 - 10.5 (mg/dL)    GFR calc non Af Amer >90  >90 (mL/min)    GFR calc Af Amer >90  >90 (mL/min)   POCT I-STAT TROPONIN I     Status: Normal   Collection Time   02/16/11 12:23 AM      Component Value Range Comment   Troponin i, poc 0.08  0.00 - 0.08 (ng/mL)    Comment 3            GLUCOSE, CAPILLARY     Status: Abnormal   Collection Time   02/16/11  7:38 AM      Component Value Range Comment   Glucose-Capillary 301 (*) 70 - 99 (mg/dL)    Comment 1 Notify RN     CBC     Status: Abnormal   Collection Time   02/16/11  9:16 AM      Component Value Range Comment   WBC 13.5 (*) 4.0 - 10.5 (K/uL)    RBC 4.40  4.22 - 5.81 (MIL/uL)    Hemoglobin 13.6  13.0 - 17.0 (g/dL)    HCT 53.6  64.4 - 03.4 (%)    MCV 93.0  78.0 - 100.0 (fL)    MCH 30.9  26.0 - 34.0 (pg)    MCHC 33.3  30.0 - 36.0 (g/dL)     RDW 74.2  59.5 - 63.8 (%)    Platelets 190  150 - 400 (K/uL)   CREATININE, SERUM     Status: Normal   Collection Time   02/16/11  9:16 AM      Component Value Range Comment   Creatinine, Ser 0.91  0.50 - 1.35 (mg/dL)    GFR calc non Af Amer >90  >90 (mL/min)    GFR calc Af Amer >  90  >90 (mL/min)   APTT     Status: Normal   Collection Time   02/16/11  9:16 AM      Component Value Range Comment   aPTT 32  24 - 37 (seconds)   PROTIME-INR     Status: Abnormal   Collection Time   02/16/11  9:16 AM      Component Value Range Comment   Prothrombin Time 15.5 (*) 11.6 - 15.2 (seconds)    INR 1.20  0.00 - 1.49    HEPATIC FUNCTION PANEL     Status: Abnormal   Collection Time   02/16/11  9:16 AM      Component Value Range Comment   Total Protein 6.1  6.0 - 8.3 (g/dL)    Albumin 3.2 (*) 3.5 - 5.2 (g/dL)    AST 20  0 - 37 (U/L)    ALT 11  0 - 53 (U/L)    Alkaline Phosphatase 82  39 - 117 (U/L)    Total Bilirubin 0.8  0.3 - 1.2 (mg/dL)    Bilirubin, Direct 0.2  0.0 - 0.3 (mg/dL)    Indirect Bilirubin 0.6  0.3 - 0.9 (mg/dL)   MAGNESIUM     Status: Normal   Collection Time   02/16/11  9:16 AM      Component Value Range Comment   Magnesium 1.6  1.5 - 2.5 (mg/dL)   GLUCOSE, CAPILLARY     Status: Abnormal   Collection Time   02/16/11 11:40 AM      Component Value Range Comment   Glucose-Capillary 367 (*) 70 - 99 (mg/dL)     Radiological Exams on Admission: Dg Chest 2 View  02/15/2011  *RADIOLOGY REPORT*  Clinical Data: Cough and fever.  Shortness of breath.  Nausea vomiting.  CHEST - 2 VIEW  Comparison:  04/27/2009  Findings:  The heart size and mediastinal contours are within normal limits.  Both lungs are clear.  The visualized skeletal structures are unremarkable.  IMPRESSION: No active cardiopulmonary disease.  Original Report Authenticated By: Danae Orleans, M.D.    Assessment/Plan Active Problems:  Atrial flutter  URTI (acute upper respiratory infection)  DM (diabetes  mellitus)   #1 atrial flutter: Currently appears to be in sinus rhythm. She was on IV Cardizem drip that has been discontinued by cardiology.  #2 acute upper respiratory infection: 2 sons have been diagnosed with influenza. Given his symptoms I suspect this is what he has as well. We'll order an influenza by PCR and we'll start him prophylactically on Tamiflu. I see no evidence for a bacterial infection/pneumonia at this time given his clear chest x-ray. IV fluids.  #3 diabetes mellitus: Sugars have been elevated in the 300 range. I will increase his Lantus from 25-30 units continue sliding scale as ordered.  Thank you for this consultation, will continue to follow with you.  Time Spent on Admission: Approximately 45 minutes.  Thomas Sandoval,Thomas Sandoval [facility]  [phone] 02/16/2011, 2:23 PM

## 2011-02-16 NOTE — ED Notes (Signed)
Old and new ekg given to Dr Saralyn Pilar

## 2011-02-16 NOTE — ED Provider Notes (Signed)
Thomas Sandoval S 8:00 PM patient discussed in sign out with Dr. Denton Lank. Patient presents with complaints of onset of nausea vomiting and headache with subjective fevers and slight cough at home. Symptoms have been present for the past 3-4 days. Patient with normal x-ray and unremarkable labs. Patient continues to have some tachycardia since being in the emergency room. Plan to continue IV fluids and monitor heart rate for improvement.  11:30 PM patient still not having improvement of heart rate after a third liter of fluids. Pulse stays in the 120s. Patient appears pale with some diaphoresis. Will get EKG.   Date: 02/15/2011  Rate: 133  Rhythm: atrial flutter  QRS Axis: normal  Intervals: QT prolonged  ST/T Wave abnormalities: ST depressions diffusely  Conduction Disutrbances: 2:1 AV Block  Narrative Interpretation:   Old EKG Reviewed: changes noted From 10/17/2000  12:45 AM I spoke with [facility] cardiology on-call. We discussed the EKG findings they will come see patient and evaluate and give recommendations.  1:00AM Dr. Margo Aye with [facility] has seen pt and will admit.  Angus Seller, [place] 02/16/11 [phone]

## 2011-02-16 NOTE — Progress Notes (Signed)
Cardiology Progress Note Patient Name: Thomas Sandoval Date of Encounter: 02/16/2011, 11:24 AM     Subjective  No overnight events. Patient still feels ill with a continued cough and nausea/vomiting. Denies chest pain, palpitations, or shortness of breath.   Objective   Telemetry: Sinus rhythm 70s-80s  Medications: . aspirin  81 mg Oral Daily  . diltiazem  15 mg Intravenous Once  . diltiazem (CARDIZEM) infusion  5-15 mg/hr Intravenous To Major  . Fluticasone-Salmeterol  1 puff Inhalation Q12H  . heparin  5,000 Units Subcutaneous Q8H  . insulin aspart  0-9 Units Subcutaneous TID WC  . insulin glargine  25 Units Subcutaneous QHS   Physical Exam: Temp:  [96.8 F (36 C)-98.5 F (36.9 C)] 98.5 F (36.9 C) (12/10 0445) Pulse Rate:  [83-134] 86  (12/10 0445) Resp:  [17-23] 20  (12/10 0445) BP: (120-159)/(49-83) 151/67 mmHg (12/10 0445) SpO2:  [92 %-96 %] 92 % (12/10 0445) Weight:  [70.7 kg (155 lb 13.8 oz)] 155 lb 13.8 oz (70.7 kg) (12/10 0445)  General: Middle-aged white male, in no acute distress. Head: Normocephalic, atraumatic, sclera non-icteric, nares are without discharge.  Neck: Supple. Negative for carotid bruits or JVD Lungs: Diminished throughout. Clear bilaterally to auscultation without wheezes, rales, or rhonchi. Breathing is unlabored. Heart: RRR S1 S2 without murmurs, rubs, or gallops.  Abdomen: Soft, non-tender, non-distended with normoactive bowel sounds. No rebound/guarding. No obvious abdominal masses. Msk:  Strength and tone appear normal for age. Extremities: No edema. No clubbing or cyanosis. Distal pedal pulses are 2+ and equal bilaterally. Neuro: Alert and oriented X 3. Moves all extremities spontaneously. Psych:  Responds to questions appropriately with a normal affect.  Intake/Output Summary (Last 24 hours) at 02/16/11 1029 Last data filed at 02/16/11 1610  Gross per 24 hour  Intake    240 ml  Output    875 ml  Net   -635 ml    Labs:  TSH - Pending  Basename 02/15/11 1744  NA 134*  K 4.1  CL 100  CO2 20  GLUCOSE 213*  BUN 9  CREATININE 0.95  CALCIUM 9.3    02/16/2011 09:16  Magnesium 1.6   Basename 02/16/11 0916 02/15/11 1744  WBC 13.5* 8.8  HGB 13.6 14.6  HCT 40.9 44.0  MCV 93.0 90.9  PLT 190 197     02/16/2011 09:16  Alkaline Phosphatase 82  Albumin 3.2 (L)  AST 20  ALT 11  Total Protein 6.1  Bilirubin, Direct 0.2  Indirect Bilirubin 0.6  Total Bilirubin 0.8    02/16/2011 09:16  Prothrombin Time 15.5 (H)  INR 1.20     02/16/2011 00:23  Troponin i, poc 0.08   Radiology/Studies:   02/16/11 - 2D Echocardiogram - Pending  Dg Chest 2 View 02/15/2011   Findings:  The heart size and mediastinal contours are within normal limits.  Both lungs are clear.  The visualized skeletal structures are unremarkable.  IMPRESSION: No active cardiopulmonary disease.       Assessment and Plan  55yom w/ PMHx of IDDM and possible COPD who presented to [facility] on 02/15/11 with a several day history of a viral-like illness who while in the ED became tachycardic and was found to be in atrial flutter with RVR. He was given IVF, placed on a cardizem drip, and admitted for further evaluation and treatment.  1. Atrial Flutter w/ RVR: Assumed to be new onset in the ED and likely related to his acute  illness. He is now in sinus rhythm on Cardizem 5mg /hr. CHADS2 score of 1 for diabetes. TSH and Echo pending. - Switch to PO cardizem then DC drip - Increase ASA to 325mg  daily  2. Viral vs Bacterial illness: Has been feeling ill with cough, diarrhea, nausea, vomiting, and subjective fever/chills over the last 4 days. Both of his children were diagnosed with the flu. WBC 13.5 this am. He is afebrile.  - Check Rapid Influenza A&B - Cont IVF and PRN zofran  3. Diabetes Mellitus, Type 1: Continue home insulin regimen  4. COPD: Cont Advair and PRN Albuterol  5. Tobacco Abuse: Smokes 2-3ppd. Tobacco cessation  counseling  Signed, HOPE, JESSICA PA-C  He is feeling terrible, just like his son.  He has a cough, aches, productive sputum.  CXR so far negative.  He has gone back in to NSR.  Agree with the above note.  Will change to oral diltiazem, dc IV dilt, get General Medicine consult, reduce IV fluid, and recheck CXR in the am.    Thomas Sandoval 02/16/2011 11:44 AM

## 2011-02-16 NOTE — H&P (Signed)
Thomas Sandoval, CAPPELLA NO.:  [id number]  MEDICAL RECORD NO.:  [id number]  LOCATION:  [id number]                        FACILITY:  [facility]  PHYSICIAN:  Natasha Bence, MD       DATE OF BIRTH:  1955/10/10  DATE OF ADMISSION:  02/15/2011 DATE OF DISCHARGE:                             HISTORY & PHYSICAL   CHIEF COMPLAINT:  Nausea and vomiting.  HISTORY OF PRESENT ILLNESS:  [name] is a 55 year old male with history of diabetes and possible COPD, who presented with a several day history of viral-like illness predominantly with nausea and vomiting. He has also had a cough that has been somewhat productive and some minimal shortness of breath.  He has subjective fevers and chills. Otherwise he is feeling of general malaise.  He denies any chest discomfort or dyspnea on exertion beyond only normally he used to having.  He denied any palpitations, lightheadedness, or dizziness.  No history of syncope.  While in the emergency department, being treated for his presume viral gastroenteritis.  He was found to be tachycardic, and this tachycardia did not respond to fluids.  An EKG was obtained, which shows him to be in atrial flutter with rapid ventricular rate. The patient is unaware of any history of arrhythmias in the past.  He has never had any problems with his heart that he is aware of.  REVIEW OF SYSTEMS:  Otherwise review of systems, he denies any bleeding, he has not had any melena or hematochezia.  No recent weight loss or weight gain.  As above he has had fevers, chills, and sweats. Respiratory, no PND or orthopnea.  Cardiovascular, no chest pain or palpitations.  Skin, no rashes or ulcers.  Musculoskeletal, normal. Psychiatric, normal.  Hematologic, normal.  Rest review of systems was normal except for stated above.  PAST MEDICAL HISTORY: 1. Diabetes, on insulin 2. COPD.  FAMILY HISTORY:  Mother with hypertension and diabetes.  SOCIAL HISTORY:  Smokes 2-3 packs  a day.  He denies any alcohol or illicit drug use.  MEDICATIONS:  Currently on Lantus insulin 32 units daily and sliding scale insulin.  He is also on albuterol inhaler as needed.  PHYSICAL EXAMINATION:  VITAL SIGNS:  He is afebrile.  Temperature 96. 8, pulse of 133, respiratory rate 22, O2 sats 95% on room air, and blood pressure 148/68. GENERAL:  He is a well-developed white male, in no apparent distress. EYES: Anicteric sclerae.  Pupils equal, round, reactive to light.  NECK: He has normal jugular venous pressure.  No carotid bruits.  No thyromegaly. HEENT:  His mucous membranes were dry. LUNGS:  Clear to auscultation bilaterally.  No wheezes or rhonchi. CARDIOVASCULAR:  Tachycardic and regular.  No murmurs, rubs, or gallops. ABDOMEN:  Soft, nontender, nondistended.  No masses or bruits. EXTREMITIES:  Warm with no edema.  He has symmetrical pulses throughout. NEURO:  Grossly focal.  Symmetrical strength and sensation throughout.  LABS:  Sodium 134, potassium was 4.1, chloride 100, bicarbonate 20, BUN was 9, creatinine 0.95, calcium was 9.3, glucose was 213.  White blood cell count was 8.8, hematocrit is 44, platelet count was 197.  Troponin was 0.08.  Chest x-ray showed no acute  infiltrates or effusions.  EKG shows atrial flutter with one-to-one conduction, ventricular rate is 133 beats per minute.  No ischemic ST changes.  IMPRESSION AND PLAN: This is a 55 year old male with diabetes and presumed chronic obstructive pulmonary disease, who presented with clinical picture of viral gastroenteritis.  He appears to be volume depleted on exam.  He was found to be in atrial flutter with rate in the 130s-1 50s in the emergency department, and this is likely exacerbated by his acute illness.  He has no known history of atrial arrhythmias in the past.  We will admit him for rate control with diltiazem and obtain an echocardiogram in the morning.  Otherwise, we will check a TSH as  well and continue volume replacement and symptom control of this nausea and vomiting.  It appears he went into this arrhythmias while in the emergency department.  Initially his heart rate was in the 90s, and has jumped up rapidly to the 130s later during this presentation.  So it was likely an acute phase, so will hold off on placing full-dose of anticoagulation to see if it convert back to sinus mechanism with diltiazem.  Otherwise for his diabetes, he has not eaten much in the last 2 days and his sugar is running low 200.  We will place him on sliding scale insulin for now and reinitiate his long-term insulin later in the course of this admission.          ______________________________ Natasha Bence, MD     MH/MEDQ  D:  02/16/2011  T:  02/16/2011  Job:  [id number]

## 2011-02-17 ENCOUNTER — Observation Stay (HOSPITAL_COMMUNITY): Payer: BC Managed Care – PPO

## 2011-02-17 DIAGNOSIS — I4892 Unspecified atrial flutter: Principal | ICD-10-CM

## 2011-02-17 DIAGNOSIS — I517 Cardiomegaly: Secondary | ICD-10-CM

## 2011-02-17 HISTORY — PX: TRANSTHORACIC ECHOCARDIOGRAM: SHX275

## 2011-02-17 LAB — CBC
HCT: 42 % (ref 39.0–52.0)
HCT: 38.9 % — ABNORMAL LOW (ref 39.0–52.0)
Hemoglobin: 14.2 g/dL (ref 13.0–17.0)
Hemoglobin: 13.7 g/dL (ref 13.0–17.0)
MCH: 31 pg (ref 26.0–34.0)
MCHC: 33.8 g/dL (ref 30.0–36.0)
RDW: 13 % (ref 11.5–15.5)
WBC: 11.8 10*3/uL — ABNORMAL HIGH (ref 4.0–10.5)

## 2011-02-17 LAB — BASIC METABOLIC PANEL
BUN: 18 mg/dL (ref 6–23)
CO2: 21 mEq/L (ref 19–32)
Calcium: 8.6 mg/dL (ref 8.4–10.5)
Chloride: 97 mEq/L (ref 96–112)
Chloride: 98 mEq/L (ref 96–112)
Creatinine, Ser: 0.8 mg/dL (ref 0.50–1.35)
Creatinine, Ser: 0.95 mg/dL (ref 0.50–1.35)
GFR calc Af Amer: >90 mL/min (ref >90)
GFR calc Af Amer: >90 mL/min (ref >90)
GFR calc Af Amer: >90 mL/min (ref >90)
GFR calc non Af Amer: >90 mL/min (ref >90)
GFR calc non Af Amer: >90 mL/min (ref >90)
GFR calc non Af Amer: >90 mL/min (ref >90)
Glucose, Bld: 330 mg/dL — ABNORMAL HIGH (ref 70–99)
Potassium: 4.5 mEq/L (ref 3.5–5.1)
Potassium: 3.7 mEq/L (ref 3.5–5.1)
Potassium: 3.4 mEq/L — ABNORMAL LOW (ref 3.5–5.1)
Sodium: 130 mEq/L — ABNORMAL LOW (ref 135–145)
Sodium: 128 mEq/L — ABNORMAL LOW (ref 135–145)
Sodium: 128 mEq/L — ABNORMAL LOW (ref 135–145)

## 2011-02-17 LAB — EXPECTORATED SPUTUM ASSESSMENT W GRAM STAIN, RFLX TO RESP C

## 2011-02-17 LAB — GLUCOSE, CAPILLARY
Glucose-Capillary: 450 mg/dL — ABNORMAL HIGH (ref 70–99)
Glucose-Capillary: 341 mg/dL — ABNORMAL HIGH (ref 70–99)

## 2011-02-17 MED ORDER — SODIUM CHLORIDE 0.9 % IV SOLN
INTRAVENOUS | Status: AC
  Administered 2011-02-17: 16:00:00 via INTRAVENOUS

## 2011-02-17 MED ORDER — DEXTROSE 50 % IV SOLN: 25.0000 mL | INTRAVENOUS | Status: DC | PRN

## 2011-02-17 MED ORDER — ENOXAPARIN SODIUM 40 MG/0.4ML ~~LOC~~ SOLN: 40.0000 mg | SUBCUTANEOUS | Status: DC

## 2011-02-17 MED ORDER — POTASSIUM CHLORIDE 10 MEQ/100ML IV SOLN
10.0000 meq | INTRAVENOUS | Status: AC
  Administered 2011-02-17 (×2): 10 meq via INTRAVENOUS
  Filled 2011-02-17 (×4): qty 100

## 2011-02-17 MED ORDER — SODIUM CHLORIDE 0.9 % IV SOLN
INTRAVENOUS | Status: DC
  Administered 2011-02-17: 100 mL/h via INTRAVENOUS

## 2011-02-17 MED ORDER — INSULIN ASPART 100 UNIT/ML ~~LOC~~ SOLN
4.0000 [IU] | Freq: Three times a day (TID) | SUBCUTANEOUS | Status: DC
  Filled 2011-02-17: qty 3

## 2011-02-17 MED ORDER — INSULIN GLARGINE 100 UNIT/ML ~~LOC~~ SOLN
30.0000 [IU] | SUBCUTANEOUS | Status: DC
  Filled 2011-02-17: qty 3

## 2011-02-17 MED ORDER — SODIUM CHLORIDE 0.9 % IV SOLN
INTRAVENOUS | Status: DC
  Administered 2011-02-17: 2.8 [IU]/h via INTRAVENOUS
  Filled 2011-02-17 (×2): qty 1

## 2011-02-17 MED ORDER — DEXTROSE-NACL 5-0.45 % IV SOLN
INTRAVENOUS | Status: DC
  Administered 2011-02-17: 21:00:00 via INTRAVENOUS

## 2011-02-17 MED ORDER — INSULIN ASPART 100 UNIT/ML ~~LOC~~ SOLN
0.0000 [IU] | Freq: Three times a day (TID) | SUBCUTANEOUS | Status: DC
  Filled 2011-02-17: qty 3

## 2011-02-17 MED ORDER — INSULIN ASPART 100 UNIT/ML ~~LOC~~ SOLN
15.0000 [IU] | Freq: Once | SUBCUTANEOUS | Status: AC
  Administered 2011-02-17: 15 [IU] via SUBCUTANEOUS

## 2011-02-17 NOTE — Progress Notes (Signed)
Utilization review complete 

## 2011-02-17 NOTE — Progress Notes (Signed)
Subjective: Patient still with nausea and vomiting as well as productive cough.  Objective: Vital signs in last 24 hours: Temp:  [98 F (36.7 C)-99.3 F (37.4 C)] 98.5 F (36.9 C) (12/11 1451) Pulse Rate:  [86-105] 91  (12/11 1451) Resp:  [20] 20  (12/11 1451) BP: (119-170)/(62-73) 128/62 mmHg (12/11 1451) SpO2:  [89 %-94 %] 92 % (12/11 1451) Weight change:  Last BM Date: 02/14/11  Intake/Output from previous day: 12/10 0701 - 12/11 0700 In: 2621 [P.O.:290; I.V.:2331] Out: 2650 [Urine:2650] Total I/O In: 360 [P.O.:360] Out: 350 [Urine:350]   Physical Exam: General: Alert, awake, oriented x3 HEENT: No bruits, no goiter. Heart: Regular rate and rhythm, without murmurs, rubs, gallops. Lungs: Clear to auscultation bilaterally. Abdomen: Soft, nontender, nondistended, positive bowel sounds. Extremities: No clubbing cyanosis or edema with positive pedal pulses. Neuro: Grossly intact, nonfocal.    Lab Results: Basic Metabolic Panel:  Basename 02/17/11 0535 02/16/11 0916 02/15/11 1744  NA 131* -- 134*  K 4.5 -- 4.1  CL 95* -- 100  CO2 17* -- 20  GLUCOSE 330* -- 213*  BUN 18 -- 9  CREATININE 0.80 0.91 --  CALCIUM 8.4 -- 9.3  MG -- 1.6 --  PHOS -- -- --   Liver Function Tests:  [facility] 02/16/11 0916  AST 20  ALT 11  ALKPHOS 82  BILITOT 0.8  PROT 6.1  ALBUMIN 3.2*   CBC:  Basename 02/17/11 0535 02/16/11 0916  WBC 14.0* 13.5*  NEUTROABS -- --  HGB 14.2 13.6  HCT 42.0 40.9  MCV 91.7 93.0  PLT 201 190   CBG:  Basename 02/17/11 1209 02/17/11 0840 02/16/11 2042 02/16/11 1627 02/16/11 1140 02/16/11 0738  GLUCAP 450* 358* 248* 304* 367* 301*   Thyroid Function Tests:  Basename 02/16/11 0916  TSH 0.831  T4TOTAL --  FREET4 --  T3FREE --  THYROIDAB --   Coagulation:  Basename 02/16/11 0916  LABPROT 15.5*  INR 1.20   Urine Drug Screen: Drugs of Abuse     Component Value Date/Time   LABOPIA NEGATIVE 04/28/2009 0547   COCAINSCRNUR NEGATIVE  04/28/2009 0547   LABBENZ NEGATIVE 04/28/2009 0547   AMPHETMU NEGATIVE 04/28/2009 0547     Recent Results (from the past 240 hour(s))  CULTURE, SPUTUM-ASSESSMENT     Status: Normal   Collection Time   02/17/11  1:26 PM      Component Value Range Status Comment   Specimen Description SPUTUM   Final    Special Requests NONE   Final    Sputum evaluation     Final    Value: THIS SPECIMEN IS ACCEPTABLE. RESPIRATORY CULTURE REPORT TO FOLLOW.   Report Status 02/17/2011 FINAL   Final     Studies/Results: Dg Chest 2 View  02/15/2011  *RADIOLOGY REPORT*  Clinical Data: Cough and fever.  Shortness of breath.  Nausea vomiting.  CHEST - 2 VIEW  Comparison:  04/27/2009  Findings:  The heart size and mediastinal contours are within normal limits.  Both lungs are clear.  The visualized skeletal structures are unremarkable.  IMPRESSION: No active cardiopulmonary disease.  Original Report Authenticated By: Danae Orleans, M.D.    Medications: Scheduled Meds:   . aspirin  325 mg Oral Daily  . diltiazem  15 mg Intravenous Once  . enoxaparin  40 mg Subcutaneous Q24H  . Fluticasone-Salmeterol  1 puff Inhalation Q12H  . heparin  5,000 Units Subcutaneous Q8H  . insulin aspart  15 Units Subcutaneous Once  . potassium chloride  10 mEq Intravenous Q1H  . DISCONTD: diltiazem  30 mg Oral Q6H  . DISCONTD: insulin aspart  0-15 Units Subcutaneous TID WC  . DISCONTD: insulin aspart  0-9 Units Subcutaneous TID WC  . DISCONTD: insulin aspart  4 Units Subcutaneous TID WC  . DISCONTD: insulin glargine  30 Units Subcutaneous QHS  . DISCONTD: insulin glargine  30 Units Subcutaneous Q24H   Continuous Infusions:   . sodium chloride 10 mL/hr at 02/17/11 1238  . sodium chloride    . sodium chloride    . dextrose 5 % and 0.45% NaCl    . diltiazem (CARDIZEM) infusion 10 mg/hr (02/17/11 1049)  . insulin (NOVOLIN-R) infusion     PRN Meds:.acetaminophen, acetaminophen, albuterol, chlorpheniramine-HYDROcodone, dextrose,  nitroGLYCERIN, ondansetron (ZOFRAN) IV, ondansetron  Assessment/Plan:  Active Problems:  Atrial flutter  URTI (acute upper respiratory infection)  DM (diabetes mellitus)   #1 atrial flutter: Did convert back to a flutter overnight. Cardiology has restarted his Cardizem drip.   #2 acute upper respiratory infection: 2 sons have tested positive for influenza. His influenza PCR screen was negative. Chest x-ray from yesterday shows no evidence for bacterial infection/pneumonia. Another chest x-ray has been ordered for today that is pending. No indication for antibiotics at this time.  #3 mild DKA: It appears patient did not take his Lantus the day prior to admission and then he refused Lantus yesterday. His bicarbonate is currently 17 with an anion gap of 19. This is indicative of very mild/early DKA. I believe it is prudent to go ahead and start him on some IV insulin and drip his sugars down and try to clear his ketoacidosis. This may be contributing to his nausea and vomiting.  We'll continue to follow with you.   LOS: 2 days   [facility] Pager: [phone] 02/17/2011, 3:12 PM

## 2011-02-17 NOTE — Progress Notes (Signed)
Cardiology Progress Note Patient Name: Thomas Sandoval Date of Encounter: 02/17/2011, 7:32 AM     Subjective  Patient converted back into A.fib/flutter w/ RVR last night. Rate controlled on cardizem drip. He continues to feel poorly and vomited last night.   Objective   Telemetry: A.fib/A.Flutter 70s-90s  Medications:  . aspirin  325 mg Oral Daily  . Fluticasone-Salmeterol  1 puff Inhalation Q12H  . heparin  5,000 Units Subcutaneous Q8H  . insulin aspart  0-9 Units Subcutaneous TID WC  . insulin glargine  30 Units Subcutaneous QHS  . off the beat book   Does not apply Once  . DISCONTD: diltiazem  30 mg Oral Q6H  . DISCONTD: insulin glargine  25 Units Subcutaneous QHS  . DISCONTD: oseltamivir  75 mg Oral BID    Physical Exam: Temp:  [98 F (36.7 C)-99.6 F (37.6 C)] 98 F (36.7 C) (12/11 0500) Pulse Rate:  [84-105] 92  (12/11 0500) Resp:  [20] 20  (12/10 2100) BP: (119-170)/(67-73) 119/69 mmHg (12/11 0500) SpO2:  [89 %-94 %] 91 % (12/11 0500)  General: Middle-aged white male, in no acute distress.  Head: Normocephalic, atraumatic, sclera non-icteric, nares are without discharge.  Neck: Supple. Negative for carotid bruits or JVD  Lungs: Diminished throughout.with scattered wheezes and rhonchi. No rales. Breathing is unlabored.  Heart: Irregular rate rhythm. S1 S2 without murmurs, rubs, or gallops.  Abdomen: Soft, non-tender, non-distended with normoactive bowel sounds. No rebound/guarding. No obvious abdominal masses.  Msk: Strength and tone appear normal for age.  Extremities: No edema. No clubbing or cyanosis. Distal pedal pulses are 2+ and equal bilaterally.  Neuro: Alert and oriented X 3. Moves all extremities spontaneously.  Psych: Responds to questions appropriately with a normal affect.  Intake/Output Summary (Last 24 hours) at 02/17/11 0732 Last data filed at 02/17/11 0700  Gross per 24 hour  Intake 2620.99 ml  Output   1800 ml  Net 820.99 ml     Labs: Basename 02/17/11 0535 02/16/11 0916 02/15/11 1744  NA 131* -- 134*  K 4.5 -- 4.1  CL 95* -- 100  CO2 17* -- 20  GLUCOSE 330* -- 213*  BUN 18 -- 9  CREATININE 0.80 0.91 --  CALCIUM 8.4 -- 9.3  MG -- 1.6 --   [facility] 02/16/11 0916  AST 20  ALT 11  ALKPHOS 82  BILITOT 0.8  PROT 6.1  ALBUMIN 3.2*   Basename 02/17/11 0535 02/16/11 0916  WBC 14.0* 13.5*  HGB 14.2 13.6  HCT 42.0 40.9  MCV 91.7 93.0  PLT 201 190   Basename 02/16/11 0916  TSH 0.831   02/16/2011 00:23:  Troponin i, poc  0.08     02/16/2011 12:47  Influenza A By PCR NEGATIVE  Influenza B By PCR NEGATIVE  H1N1 flu by pcr NOT DETECTED    Radiology/Studies:   02/17/11 - CXR - Pending 02/17/11 - 2D Echocardiogram - Pending  Dg Chest 2 View  02/15/2011 Findings: The heart size and mediastinal contours are within normal limits. Both lungs are clear. The visualized skeletal structures are unremarkable. IMPRESSION: No active cardiopulmonary disease.     Assessment and Plan  55yom w/ PMHx of IDDM and possible COPD who presented to [facility] on 02/15/11 with a several day history of a viral-like illness who while in the ED became tachycardic and was found to be in atrial flutter with RVR. He was given IVF, placed on a cardizem drip, and admitted for  further evaluation and treatment.   1. Atrial Flutter w/ RVR: Assumed to be new onset in the ED and likely related to his acute illness. He converted to NSR yesterday but went into A.fib/flutter again last night and was placed back on a cardizem drip. CHADS2 score of 1 for diabetes. TSH normal.  - Echo pending.  - Continue cardizem drip - Continue ASA to 325mg  daily   2. Viral vs Bacterial illness: Has been feeling ill with cough, diarrhea, nausea, vomiting, and subjective fever/chills over the last 4 days. Both of his children were diagnosed with the flu. WBC 14.0 this am. He is afebrile.  - Influenza A&B PCR negative - Cont IVF and PRN zofran  - CXR  Pending  3. Diabetes Mellitus, Type 1: Blood sugars in the 300 range. Lantus increased yesterday. Cont sliding scale  4. COPD: Cont Advair and PRN Albuterol   5. Tobacco Abuse: Smokes 2-3ppd. Tobacco cessation counseling   Signed, HOPE, JESSICA PA-C   Patient seen and examined.  He sent the CXR away this am.  Echo just done but results unavailable.  Back in atrial fib.  Lung exam does not reveal rales, just  ronchii, just decreased BS.  Appreciate Gen Med help on treatment of presumed viral illness.

## 2011-02-17 NOTE — Progress Notes (Signed)
Noted MD note stating pt in early DKA.  Noted DKA protocol initiated.  Assisted RN in setting up the [employer].  RN currently attempting IV placement so she can start IV fluid boluses and IV insulin drip.  Pt to be transferred to step-down unit.  Will follow.

## 2011-02-17 NOTE — Progress Notes (Signed)
Patient noted be in rapid a.fib with heart rate=140's. MD notified, new orders received to start IV cardizem gtt with bolus. Patient on assessment heart rate=103. Will continue to monitor.

## 2011-02-17 NOTE — Progress Notes (Signed)
  Echocardiogram 2D Echocardiogram has been performed.  Thomas Sandoval F 02/17/2011, 10:03 AM

## 2011-02-17 NOTE — Progress Notes (Signed)
Inpatient Diabetes Program Recommendations  AACE/ADA: New Consensus Statement on Inpatient Glycemic Control (2009)  Target Ranges:  Prepandial:   less than 140 mg/dL      Peak postprandial:   less than 180 mg/dL (1-2 hours)      Critically ill patients:  140 - 180 mg/dL   CBGs 16/10: 960/ 454/ 304/ 248 mg/dl Pt refused Lantus last night stating he normally takes Lantus in the morning.  Did not get any Lantus yesterday (02/16/11).  Finally got 30 units Lantus this morning at 0842am.    Inpatient Diabetes Program Recommendations Insulin - Basal: Please change Lantus 30 units QHS to daily in AM (pt refusing at night, states he takes Lantus in the morning at home) Correction (SSI): Please increase Novolog SSI to Moderate scale tid ac + HS (currently on Sensitive scale).

## 2011-02-18 ENCOUNTER — Observation Stay (HOSPITAL_COMMUNITY): Payer: BC Managed Care – PPO

## 2011-02-18 LAB — GLUCOSE, CAPILLARY
Glucose-Capillary: 134 mg/dL — ABNORMAL HIGH (ref 70–99)
Glucose-Capillary: 136 mg/dL — ABNORMAL HIGH (ref 70–99)
Glucose-Capillary: 148 mg/dL — ABNORMAL HIGH (ref 70–99)
Glucose-Capillary: 171 mg/dL — ABNORMAL HIGH (ref 70–99)
Glucose-Capillary: 173 mg/dL — ABNORMAL HIGH (ref 70–99)
Glucose-Capillary: 278 mg/dL — ABNORMAL HIGH (ref 70–99)
Glucose-Capillary: 201 mg/dL — ABNORMAL HIGH (ref 70–99)
Glucose-Capillary: 202 mg/dL — ABNORMAL HIGH (ref 70–99)

## 2011-02-18 LAB — BASIC METABOLIC PANEL
CO2: 21 mEq/L (ref 19–32)
Chloride: 98 mEq/L (ref 96–112)
Creatinine, Ser: 0.7 mg/dL (ref 0.50–1.35)
Potassium: 3.1 mEq/L — ABNORMAL LOW (ref 3.5–5.1)

## 2011-02-18 LAB — DIFFERENTIAL
Basophils Absolute: 0 10*3/uL (ref 0.0–0.1)
Lymphocytes Relative: 5 % — ABNORMAL LOW (ref 12–46)
Monocytes Absolute: 0.9 10*3/uL (ref 0.1–1.0)
Monocytes Relative: 9 % (ref 3–12)
Neutro Abs: 8.3 10*3/uL — ABNORMAL HIGH (ref 1.7–7.7)
Neutrophils Relative %: 86 % — ABNORMAL HIGH (ref 43–77)

## 2011-02-18 LAB — CBC
HCT: 33 % — ABNORMAL LOW (ref 39.0–52.0)
Hemoglobin: 11.5 g/dL — ABNORMAL LOW (ref 13.0–17.0)
RDW: 12.8 % (ref 11.5–15.5)
WBC: 9.7 10*3/uL (ref 4.0–10.5)

## 2011-02-18 MED ORDER — POTASSIUM CHLORIDE 20 MEQ/15ML (10%) PO LIQD
ORAL | Status: AC
  Administered 2011-02-18: 40 meq via ORAL
  Filled 2011-02-18: qty 30

## 2011-02-18 MED ORDER — POTASSIUM CHLORIDE 20 MEQ/15ML (10%) PO LIQD
40.0000 meq | ORAL | Status: DC
  Administered 2011-02-18: 40 meq via ORAL
  Filled 2011-02-18 (×3): qty 30

## 2011-02-18 MED ORDER — DILTIAZEM HCL 60 MG PO TABS
60.0000 mg | ORAL_TABLET | Freq: Four times a day (QID) | ORAL | Status: DC
  Administered 2011-02-18 – 2011-02-20 (×8): 60 mg via ORAL
  Filled 2011-02-18 (×12): qty 1

## 2011-02-18 MED ORDER — POTASSIUM CHLORIDE CRYS ER 20 MEQ PO TBCR
40.0000 meq | EXTENDED_RELEASE_TABLET | ORAL | Status: DC
  Filled 2011-02-18: qty 2

## 2011-02-18 MED ORDER — INSULIN ASPART 100 UNIT/ML ~~LOC~~ SOLN
0.0000 [IU] | SUBCUTANEOUS | Status: DC
  Administered 2011-02-18: 3 [IU] via SUBCUTANEOUS
  Administered 2011-02-18: 2 [IU] via SUBCUTANEOUS
  Administered 2011-02-18: 3 [IU] via SUBCUTANEOUS
  Administered 2011-02-18: 2 [IU] via SUBCUTANEOUS
  Administered 2011-02-18: 3 [IU] via SUBCUTANEOUS
  Administered 2011-02-19: 1 [IU] via SUBCUTANEOUS
  Administered 2011-02-19: 2 [IU] via SUBCUTANEOUS
  Administered 2011-02-19: 5 [IU] via SUBCUTANEOUS
  Administered 2011-02-19: 7 [IU] via SUBCUTANEOUS
  Administered 2011-02-19 – 2011-02-20 (×2): 2 [IU] via SUBCUTANEOUS

## 2011-02-18 MED ORDER — WHITE PETROLATUM GEL
Status: AC
  Administered 2011-02-18: 16:00:00
  Filled 2011-02-18: qty 5

## 2011-02-18 MED ORDER — INSULIN GLARGINE 100 UNIT/ML ~~LOC~~ SOLN
32.0000 [IU] | Freq: Every day | SUBCUTANEOUS | Status: DC
  Administered 2011-02-18 – 2011-02-19 (×2): 32 [IU] via SUBCUTANEOUS
  Filled 2011-02-18: qty 3

## 2011-02-18 MED ORDER — POTASSIUM CHLORIDE CRYS ER 20 MEQ PO TBCR: 40.0000 meq | EXTENDED_RELEASE_TABLET | ORAL | Status: DC

## 2011-02-18 MED ORDER — POTASSIUM CHLORIDE 20 MEQ/15ML (10%) PO LIQD
ORAL | Status: AC
  Filled 2011-02-18: qty 30

## 2011-02-18 MED ORDER — SODIUM CHLORIDE 0.9 % IV SOLN
INTRAVENOUS | Status: DC
  Administered 2011-02-18: 125 mL/h via INTRAVENOUS
  Administered 2011-02-18: 17:00:00 via INTRAVENOUS

## 2011-02-18 MED ORDER — POTASSIUM CHLORIDE CRYS ER 20 MEQ PO TBCR
40.0000 meq | EXTENDED_RELEASE_TABLET | ORAL | Status: AC
  Administered 2011-02-19: 40 meq via ORAL
  Filled 2011-02-18: qty 2

## 2011-02-18 MED ORDER — POTASSIUM CHLORIDE CRYS ER 20 MEQ PO TBCR
EXTENDED_RELEASE_TABLET | ORAL | Status: AC
  Administered 2011-02-18: 40 meq
  Filled 2011-02-18: qty 2

## 2011-02-18 MED ORDER — PHENOL 1.4 % MT LIQD
1.0000 | OROMUCOSAL | Status: DC | PRN
  Administered 2011-02-18: 1 via OROMUCOSAL
  Filled 2011-02-18: qty 177

## 2011-02-18 MED ORDER — INSULIN GLARGINE 100 UNIT/ML ~~LOC~~ SOLN
35.0000 [IU] | Freq: Once | SUBCUTANEOUS | Status: AC
  Administered 2011-02-18: 35 [IU] via SUBCUTANEOUS

## 2011-02-18 NOTE — Progress Notes (Signed)
Pt given 2 units of novolog insulin prior to dc insluin gtt. Around this same time pt wanted to get oob to chair. During transfer he became very tachepnic (RR in 30'-40's). Pt placed on 50% ventimask. MD notified and pt placed back in bed. RT also aware and pt was given a breathing tx. Portable chest xray ordered and completed. Will cont to monitor pt.

## 2011-02-18 NOTE — Progress Notes (Signed)
Inpatient Diabetes Program Recommendations  AACE/ADA: New Consensus Statement on Inpatient Glycemic Control (2009)  Target Ranges:  Prepandial:   less than 140 mg/dL      Peak postprandial:   less than 180 mg/dL (1-2 hours)      Critically ill patients:  140 - 180 mg/dL   Transitioned off insulin drip this morning.  Got 35 units Lantus at 0300 AM.    Inpatient Diabetes Program Recommendations Insulin - Basal: Please order standing Lantus dose- Pt got 35 units Lantus at 0300am today before insulin drip d/c'd. Correction (SSI): Please increase Novolog SSI to Moderate scale tid ac + HS (currently on Sensitive scale).

## 2011-02-18 NOTE — Progress Notes (Signed)
Subjective: Patient denies any further nausea and vomiting, states cough better on current meds  Objective: Vital signs in last 24 hours: Temp:  [97.9 F (36.6 C)-99.6 F (37.6 C)] 99.2 F (37.3 C) (12/12 1200) Pulse Rate:  [91-101] 93  (12/12 1200) Resp:  [20-31] 24  (12/12 1200) BP: (110-157)/(57-72) 110/62 mmHg (12/12 1200) SpO2:  [90 %-97 %] 93 % (12/12 1200) FiO2 (%):  [35 %] 35 % (12/12 0400) Weight change:  Last BM Date: 02/14/11  Intake/Output from previous day: 12/11 0701 - 12/12 0700 In: 1420.5 [P.O.:360; I.V.:1060.5] Out: 2650 [Urine:2650] Total I/O In: 193.8 [I.V.:193.8] Out: 0    Physical Exam: General: Somnolent, but easily aroused., oriented x3 HEENT: No bruits, no goiter. Heart: Regular rate and rhythm, without murmurs, rubs, gallops. Lungs: Clear to auscultation bilaterally. No crackles or wheezes. Abdomen: Soft, nontender, nondistended, positive bowel sounds. Extremities: No clubbing cyanosis or edema with positive pedal pulses. Neuro: Grossly intact, nonfocal.    Lab Results: Basic Metabolic Panel:  Basename 02/18/11 0420 02/17/11 2145 02/16/11 0916  NA 129* 128* --  K 3.1* 3.4* --  CL 98 98 --  CO2 21 21 --  GLUCOSE 155* 164* --  BUN 16 20 --  CREATININE 0.70 0.76 --  CALCIUM 7.9* 8.4 --  MG -- -- 1.6  PHOS -- -- --   Liver Function Tests:  [facility] 02/16/11 0916  AST 20  ALT 11  ALKPHOS 82  BILITOT 0.8  PROT 6.1  ALBUMIN 3.2*   CBC:  Basename 02/18/11 0420 02/17/11 1553  WBC 9.7 11.8*  NEUTROABS 8.3* --  HGB 11.5* 13.7  HCT 33.0* 38.9*  MCV 89.4 90.5  PLT 165 189   CBG:  Basename 02/18/11 1149 02/18/11 0753 02/18/11 0524 02/18/11 0420 02/18/11 0258 02/18/11 0149  GLUCAP 203* 179* 173* 171* 148* 134*   Thyroid Function Tests:  Basename 02/16/11 0916  TSH 0.831  T4TOTAL --  FREET4 --  T3FREE --  THYROIDAB --   Coagulation:  Basename 02/16/11 0916  LABPROT 15.5*  INR 1.20   Urine Drug Screen: Drugs of  Abuse     Component Value Date/Time   LABOPIA NEGATIVE 04/28/2009 0547   COCAINSCRNUR NEGATIVE 04/28/2009 0547   LABBENZ NEGATIVE 04/28/2009 0547   AMPHETMU NEGATIVE 04/28/2009 0547     Recent Results (from the past 240 hour(s))  CULTURE, SPUTUM-ASSESSMENT     Status: Normal   Collection Time   02/17/11  1:26 PM      Component Value Range Status Comment   Specimen Description SPUTUM   Final    Special Requests NONE   Final    Sputum evaluation     Final    Value: THIS SPECIMEN IS ACCEPTABLE. RESPIRATORY CULTURE REPORT TO FOLLOW.   Report Status 02/17/2011 FINAL   Final   CULTURE, RESPIRATORY     Status: Normal (Preliminary result)   Collection Time   02/17/11  1:26 PM      Component Value Range Status Comment   Specimen Description SPUTUM   Final    Special Requests NONE   Final    Gram Stain     Final    Value: ABUNDANT WBC PRESENT, PREDOMINANTLY PMN     NO SQUAMOUS EPITHELIAL CELLS SEEN     ABUNDANT GRAM POSITIVE COCCI     IN PAIRS   Culture Culture reincubated for better growth   Final    Report Status PENDING   Incomplete   MRSA PCR SCREENING  Status: Normal   Collection Time   02/17/11  5:20 PM      Component Value Range Status Comment   MRSA by PCR NEGATIVE  NEGATIVE  Final     Studies/Results: Dg Chest 2 View  02/17/2011  *RADIOLOGY REPORT*  Clinical Data: Productive cough  CHEST - 2 VIEW  Comparison: Of 02/15/2011  Findings: The heart and mediastinal contours within normal limits. Pulmonary vascularity is normal.  There is mild peribronchial thickening at the lung bases.  No confluent airspace opacities or pulmonary edema.  No visible pleural effusion.  Negative for pneumothorax.  Mild pleuroparenchymal scarring at the lung apices. No acute bony abnormality identified.  IMPRESSION: Mild peribronchial thickening at the lung bases.  Findings can be seen with acute or chronic bronchitis, smokers, or patient's with asthma.  Original Report Authenticated By: Britta Mccreedy,  M.D.   Dg Chest Port 1 View  02/18/2011  *RADIOLOGY REPORT*  Clinical Data: The kidney and  PORTABLE CHEST - 1 VIEW  Comparison: 02/17/2011  Findings: Normal heart size and pulmonary vascularity. Peribronchial thickening and mild interstitial changes in the lungs consistent with chronic bronchitis and fibrosis.  No focal airspace consolidation in the lungs.  No blunting of costophrenic angles. No pneumothorax.  No significant change since previous study, allowing for technical differences.  IMPRESSION: Chronic bronchitic changes and interstitial fibrosis in the lungs. No evidence of active consolidation.  Original Report Authenticated By: Marlon Pel, M.D.    Medications: Scheduled Meds:    . aspirin  325 mg Oral Daily  . diltiazem  60 mg Oral QID  . Fluticasone-Salmeterol  1 puff Inhalation Q12H  . heparin  5,000 Units Subcutaneous Q8H  . insulin aspart  0-9 Units Subcutaneous Q4H  . insulin glargine  32 Units Subcutaneous QHS  . insulin glargine  35 Units Subcutaneous Once  . potassium chloride  10 mEq Intravenous Q1H  . DISCONTD: enoxaparin  40 mg Subcutaneous Q24H  . DISCONTD: insulin aspart  0-15 Units Subcutaneous TID WC  . DISCONTD: insulin aspart  0-9 Units Subcutaneous TID WC  . DISCONTD: insulin aspart  4 Units Subcutaneous TID WC  . DISCONTD: insulin glargine  30 Units Subcutaneous QHS  . DISCONTD: insulin glargine  30 Units Subcutaneous Q24H   Continuous Infusions:    . sodium chloride 999 mL/hr at 02/17/11 1544  . sodium chloride 125 mL/hr at 02/18/11 1000  . DISCONTD: sodium chloride 10 mL/hr at 02/17/11 1238  . DISCONTD: sodium chloride 100 mL/hr (02/17/11 1700)  . DISCONTD: dextrose 5 % and 0.45% NaCl 75 mL/hr at 02/18/11 0800  . DISCONTD: diltiazem (CARDIZEM) infusion 10 mg/hr (02/18/11 0700)  . DISCONTD: insulin (NOVOLIN-R) infusion 5.5 mL/hr at 02/17/11 1945   PRN Meds:.acetaminophen, acetaminophen, albuterol, chlorpheniramine-HYDROcodone, dextrose,  nitroGLYCERIN, ondansetron (ZOFRAN) IV, ondansetron, phenol  Assessment/Plan:  Active Problems:  Atrial flutter  URTI (acute upper respiratory infection)  DM (diabetes mellitus)   #1 atrial flutter: Per cardiology.   #2 acute upper respiratory infection: Chest x-ray with no active consolidation - chronic bronchitic changes and interstitial fibrosis.  2 sons have tested positive for influenza, but his influenza PCR screen was negative. Chest x-ray from 12/11 also showed no evidence for bacterial infection/pneumonia.  No indication for antibiotics at this time.  #3 mild DKA: Patient now off glucostabilizer, outpatient Lantus resumed, continue sliding scale coverage.It appears patient did not take his Lantus the day prior to admission and then he refused Lantus 12/10. His acidosis is now resolved and-and CO2 21  #  4 hypokalemia-replace with potassium  #5 hyponatremia - improving with hydration and better blood glucose control.  LOS: 3 days   Kela Millin [facility] Pager: [phone] 02/18/2011, 1:29 PM

## 2011-02-18 NOTE — Progress Notes (Signed)
HPI:  Patient is hoarse, still feels bad.  Now on glucomander, and moved to the unit in order to do this by general medicine.  He feels about the same.  Still some cough.    Current Facility-Administered Medications  Medication Dose Route Frequency Provider Last Rate Last Dose  . 0.9 %  sodium chloride infusion   Intravenous Continuous Estela Hernandez Acosta 999 mL/hr at 02/17/11 1544    . 0.9 %  sodium chloride infusion   Intravenous Continuous Adeline C Viyuoh 125 mL/hr at 02/18/11 1000    . acetaminophen (TYLENOL) tablet 650 mg  650 mg Oral Q6H PRN Rosalyn Gess. Hall       Or  . acetaminophen (TYLENOL) suppository 650 mg  650 mg Rectal Q6H PRN Rosalyn Gess. Hall      . albuterol (PROVENTIL) (5 MG/ML) 0.5% nebulizer solution 2.5 mg  2.5 mg Nebulization Q6H PRN Rosalyn Gess. Hall   2.5 mg at 02/18/11 0551  . aspirin tablet 325 mg  325 mg Oral Daily Jessica Hope   325 mg at 02/18/11 0930  . chlorpheniramine-HYDROcodone (TUSSIONEX) 10-8 MG/5ML suspension 5 mL  5 mL Oral Q12H PRN Rosalyn Gess. Hall   5 mL at 02/16/11 2156  . dextrose 50 % solution 25 mL  25 mL Intravenous PRN Estela Philip Aspen      . diltiazem (CARDIZEM) 100 mg in dextrose 5 % 100 mL infusion  5-15 mg/hr Intravenous Titrated Pelbreton C. Balfour 10 mL/hr at 02/18/11 0700 10 mg/hr at 02/18/11 0700  . Fluticasone-Salmeterol (ADVAIR) 250-50 MCG/DOSE inhaler 1 puff  1 puff Inhalation Q12H Dayna N Dunn, PA   1 puff at 02/18/11 0811  . heparin injection 5,000 Units  5,000 Units Subcutaneous Q8H Michael E. Hall   5,000 Units at 02/18/11 1610  . insulin aspart (novoLOG) injection 0-9 Units  0-9 Units Subcutaneous Q4H Anastassia Doutova   2 Units at 02/18/11 0806  . insulin aspart (novoLOG) injection 15 Units  15 Units Subcutaneous Once Estela Hernandez Acosta   15 Units at 02/17/11 1313  . insulin glargine (LANTUS) injection 35 Units  35 Units Subcutaneous Once Anastassia Doutova   35 Units at 02/18/11 0302  . nitroGLYCERIN (NITROSTAT) SL  tablet 0.4 mg  0.4 mg Sublingual Q5 min PRN Angus Seller, PA      . ondansetron [facility]) tablet 4 mg  4 mg Oral Q6H PRN Rosalyn Gess. Hall       Or  . ondansetron (ZOFRAN) injection 4 mg  4 mg Intravenous Q6H PRN Rosalyn Gess. Hall   4 mg at 02/17/11 1034  . potassium chloride 10 mEq in 100 mL IVPB  10 mEq Intravenous Q1H Estela Hernandez Acosta   10 mEq at 02/17/11 1740  . DISCONTD: 0.9 %  sodium chloride infusion   Intravenous Continuous Shawnie Pons, MD 10 mL/hr at 02/17/11 1238    . DISCONTD: 0.9 %  sodium chloride infusion   Intravenous Continuous Estela Hernandez Acosta 100 mL/hr at 02/17/11 1700 100 mL/hr at 02/17/11 1700  . DISCONTD: dextrose 5 %-0.45 % sodium chloride infusion   Intravenous Continuous Estela Hernandez Acosta 75 mL/hr at 02/18/11 0800    . DISCONTD: enoxaparin (LOVENOX) injection 40 mg  40 mg Subcutaneous Q24H Estela Philip Aspen      . DISCONTD: insulin aspart (novoLOG) injection 0-15 Units  0-15 Units Subcutaneous TID WC Estela Philip Aspen      . DISCONTD: insulin aspart (novoLOG) injection 0-9 Units  0-9 Units Subcutaneous TID WC  Laurann Montana, PA   9 Units at 02/17/11 [phone]  . DISCONTD: insulin aspart (novoLOG) injection 4 Units  4 Units Subcutaneous TID WC Estela Philip Aspen      . DISCONTD: insulin glargine (LANTUS) injection 30 Units  30 Units Subcutaneous QHS Estela Hernandez Acosta   30 Units at 02/17/11 [phone]  . DISCONTD: insulin glargine (LANTUS) injection 30 Units  30 Units Subcutaneous Q24H Estela Philip Aspen      . DISCONTD: insulin regular (NOVOLIN R,HUMULIN R) 1 Units/mL in sodium chloride 0.9 % 100 mL infusion   Intravenous Continuous Estela Hernandez Acosta 5.5 mL/hr at 02/17/11 1945      No Known Allergies  Past Medical History  Diagnosis Date  . Diabetes mellitus   . Tuberculosis     History reviewed. No pertinent past surgical history.  No family history on file.  History   Social History  . Marital Status: Married    Spouse  Name: N/A    Number of Children: N/A  . Years of Education: N/A   Occupational History  . Not on file.   Social History Main Topics  . Smoking status: Current Everyday Smoker  . Smokeless tobacco: Not on file  . Alcohol Use: No  . Drug Use: No  . Sexually Active:    Other Topics Concern  . Not on file   Social History Narrative  . No narrative on file    ROS: Please see the HPI.  All other systems reviewed and negative.  PHYSICAL EXAM:  BP 135/72  Pulse 96  Temp(Src) 98.3 F (36.8 C) (Oral)  Resp 26  Ht 5\' 10"  (1.778 m)  Wt 70.7 kg (155 lb 13.8 oz)  BMI 22.36 kg/m2  SpO2 94%  General: Well developed, well nourished, with hoarse voice Head:  Normocephalic and atraumatic. Neck: no JVD Lungs: Decrease BS bilaterally consistent with severe COPD Heart: Normal S1 and S2.  No murmur, rubs or gallops. HS distant Abdomen:  Normal bowel sounds; soft; non tender; no organomegaly Pulses: Pulses normal in all 4 extremities. Extremities: No clubbing or cyanosis. No edema. Neurologic: Alert and oriented x 3.  EKG:  Telemetry:  Atrial fib, mod response  ASSESSMENT AND PLAN:   VIral syndrome     Followed by Gen Med who is assisting in management.  On Tamiflu DM    On Glucomander, and Gen Med managing New onset atrial fib    Controlled.  Echo normal.  [place] score 1.  Currently sub q.  Plan:  Up in chair, convert to PO dilt, defer viral and DM treatment to gen med.    Shawnie Pons 11:48 AM 02/18/2011

## 2011-02-19 DIAGNOSIS — I4892 Unspecified atrial flutter: Secondary | ICD-10-CM

## 2011-02-19 LAB — SODIUM, URINE, RANDOM: Sodium, Ur: 82 mEq/L

## 2011-02-19 LAB — BASIC METABOLIC PANEL
BUN: 14 mg/dL (ref 6–23)
Calcium: 7.5 mg/dL — ABNORMAL LOW (ref 8.4–10.5)
Calcium: 7.7 mg/dL — ABNORMAL LOW (ref 8.4–10.5)
Chloride: 98 mEq/L (ref 96–112)
Creatinine, Ser: 0.74 mg/dL (ref 0.50–1.35)
Creatinine, Ser: 0.82 mg/dL (ref 0.50–1.35)
GFR calc Af Amer: >90 mL/min (ref >90)
GFR calc non Af Amer: >90 mL/min (ref >90)
GFR calc non Af Amer: >90 mL/min (ref >90)
Sodium: 128 mEq/L — ABNORMAL LOW (ref 135–145)

## 2011-02-19 LAB — GLUCOSE, CAPILLARY: Glucose-Capillary: 261 mg/dL — ABNORMAL HIGH (ref 70–99)

## 2011-02-19 LAB — OCCULT BLOOD X 1 CARD TO LAB, STOOL: Fecal Occult Bld: NEGATIVE

## 2011-02-19 LAB — OSMOLALITY, URINE: Osmolality, Ur: 397 mOsm/kg (ref 390–1090)

## 2011-02-19 MED ORDER — FUROSEMIDE 10 MG/ML IJ SOLN
40.0000 mg | Freq: Once | INTRAMUSCULAR | Status: AC
  Administered 2011-02-19: 40 mg via INTRAVENOUS
  Filled 2011-02-19 (×2): qty 4

## 2011-02-19 MED ORDER — MOXIFLOXACIN HCL 400 MG PO TABS
400.0000 mg | ORAL_TABLET | Freq: Every day | ORAL | Status: DC
  Administered 2011-02-20 – 2011-02-21 (×2): 400 mg via ORAL
  Filled 2011-02-19 (×3): qty 1

## 2011-02-19 NOTE — Progress Notes (Signed)
eLink Physician-Brief Progress Note Patient Name: Thomas Sandoval DOB: Nov 13, 1955 MRN: [id number]  Date of Service  02/19/2011   HPI/Events of Note   Sputum cx- pneumococcus  eICU Interventions  PO avelox x 7ds   Intervention Category Intermediate Interventions: Diagnostic test evaluation  ALVA,RAKESH V. 02/19/2011, 8:35 PM

## 2011-02-19 NOTE — Progress Notes (Signed)
Subjective:  Feeling puney. In NSR on po diltiazem.   Objective:  Vital Signs in the last 24 hours: Temp:  [98 F (36.7 C)-99.2 F (37.3 C)] 98.9 F (37.2 C) (12/13 0400) Pulse Rate:  [70-93] 91  (12/13 0400) Resp:  [18-35] 24  (12/13 0400) BP: (108-132)/(48-62) 123/51 mmHg (12/13 0400) SpO2:  [90 %-93 %] 92 % (12/13 0400)  Intake/Output from previous day: 12/12 0701 - 12/13 0700 In: 2808.8 [P.O.:240; I.V.:2568.8] Out: 1050 [Urine:1050] Intake/Output from this shift:    Physical Exam: NAD.  Cardiac RRR Lungs with decrease BS in bases.  Lab Results:  Basename 02/18/11 0420 02/17/11 1553  WBC 9.7 11.8*  HGB 11.5* 13.7  PLT 165 189    Basename 02/19/11 0410 02/18/11 0420  NA 125* 129*  K 3.6 3.1*  CL 98 98  CO2 21 21  GLUCOSE 147* 155*  BUN 14 16  CREATININE 0.74 0.70   No results found for this basename: TROPONINI:2,CK,MB:2 in the last 72 hours Hepatic Function Panel  Basename 02/16/11 0916  PROT 6.1  ALBUMIN 3.2*  AST 20  ALT 11  ALKPHOS 82  BILITOT 0.8  BILIDIR 0.2  IBILI 0.6   No results found for this basename: CHOL in the last 72 hours No results found for this basename: PROTIME in the last 72 hours  Imaging:   Cardiac Studies:  Assessment/Plan:  In NSR. LV function is good at 65%. Agree with diuresis and increased activity.  LOS: 4 days    Valera Castle 02/19/2011, 8:20 AM

## 2011-02-19 NOTE — Progress Notes (Signed)
Subjective: Just feeling tired/weak this am, nauseous, but no vomiting also c/o some SOB  Objective: Vital signs in last 24 hours: Temp:  [98 F (36.7 C)-99.2 F (37.3 C)] 99 F (37.2 C) (12/13 0800) Pulse Rate:  [70-93] 93  (12/13 0800) Resp:  [18-35] 22  (12/13 0800) BP: (108-141)/(48-62) 141/55 mmHg (12/13 0800) SpO2:  [90 %-93 %] 93 % (12/13 0821) Weight change:  Last BM Date: 02/14/11  Intake/Output from previous day: 12/12 0701 - 12/13 0700 In: 2808.8 [P.O.:240; I.V.:2568.8] Out: 1050 [Urine:1050] Total I/O In: -  Out: 350 [Urine:350]   Physical Exam: General:alert, oriented x3 HEENT: No bruits, no goiter. Heart: Regular rate and rhythm, without murmurs, rubs, gallops. Lungs: Decreased BS at bases with few basilar crackles . Abdomen: Soft, nontender, nondistended, positive bowel sounds. Extremities: No clubbing cyanosis or edema with positive pedal pulses. Neuro: Grossly intact, nonfocal.    Lab Results: Basic Metabolic Panel:  Basename 02/19/11 0410 02/18/11 0420 02/16/11 0916  NA 125* 129* --  K 3.6 3.1* --  CL 98 98 --  CO2 21 21 --  GLUCOSE 147* 155* --  BUN 14 16 --  CREATININE 0.74 0.70 --  CALCIUM 7.5* 7.9* --  MG -- -- 1.6  PHOS -- -- --   Liver Function Tests:  [facility] 02/16/11 0916  AST 20  ALT 11  ALKPHOS 82  BILITOT 0.8  PROT 6.1  ALBUMIN 3.2*   CBC:  Basename 02/18/11 0420 02/17/11 1553  WBC 9.7 11.8*  NEUTROABS 8.3* --  HGB 11.5* 13.7  HCT 33.0* 38.9*  MCV 89.4 90.5  PLT 165 189   CBG:  Basename 02/19/11 0733 02/19/11 0413 02/19/11 0012 02/18/11 1935 02/18/11 1557 02/18/11 1149  GLUCAP 117* 126* 261* 202* 201* 203*   Thyroid Function Tests:  Basename 02/16/11 0916  TSH 0.831  T4TOTAL --  FREET4 --  T3FREE --  THYROIDAB --   Coagulation:  Basename 02/16/11 0916  LABPROT 15.5*  INR 1.20   Urine Drug Screen: Drugs of Abuse     Component Value Date/Time   LABOPIA NEGATIVE 04/28/2009 0547   COCAINSCRNUR  NEGATIVE 04/28/2009 0547   LABBENZ NEGATIVE 04/28/2009 0547   AMPHETMU NEGATIVE 04/28/2009 0547     Recent Results (from the past 240 hour(s))  CULTURE, SPUTUM-ASSESSMENT     Status: Normal   Collection Time   02/17/11  1:26 PM      Component Value Range Status Comment   Specimen Description SPUTUM   Final    Special Requests NONE   Final    Sputum evaluation     Final    Value: THIS SPECIMEN IS ACCEPTABLE. RESPIRATORY CULTURE REPORT TO FOLLOW.   Report Status 02/17/2011 FINAL   Final   CULTURE, RESPIRATORY     Status: Normal (Preliminary result)   Collection Time   02/17/11  1:26 PM      Component Value Range Status Comment   Specimen Description SPUTUM   Final    Special Requests NONE   Final    Gram Stain     Final    Value: ABUNDANT WBC PRESENT, PREDOMINANTLY PMN     NO SQUAMOUS EPITHELIAL CELLS SEEN     ABUNDANT GRAM POSITIVE COCCI     IN PAIRS   Culture Culture reincubated for better growth   Final    Report Status PENDING   Incomplete   MRSA PCR SCREENING     Status: Normal   Collection Time   02/17/11  5:20  PM      Component Value Range Status Comment   MRSA by PCR NEGATIVE  NEGATIVE  Final     Studies/Results: Dg Chest 2 View  02/17/2011  *RADIOLOGY REPORT*  Clinical Data: Productive cough  CHEST - 2 VIEW  Comparison: Of 02/15/2011  Findings: The heart and mediastinal contours within normal limits. Pulmonary vascularity is normal.  There is mild peribronchial thickening at the lung bases.  No confluent airspace opacities or pulmonary edema.  No visible pleural effusion.  Negative for pneumothorax.  Mild pleuroparenchymal scarring at the lung apices. No acute bony abnormality identified.  IMPRESSION: Mild peribronchial thickening at the lung bases.  Findings can be seen with acute or chronic bronchitis, smokers, or patient's with asthma.  Original Report Authenticated By: Britta Mccreedy, M.D.   Dg Chest Port 1 View  02/18/2011  *RADIOLOGY REPORT*  Clinical Data: The  kidney and  PORTABLE CHEST - 1 VIEW  Comparison: 02/17/2011  Findings: Normal heart size and pulmonary vascularity. Peribronchial thickening and mild interstitial changes in the lungs consistent with chronic bronchitis and fibrosis.  No focal airspace consolidation in the lungs.  No blunting of costophrenic angles. No pneumothorax.  No significant change since previous study, allowing for technical differences.  IMPRESSION: Chronic bronchitic changes and interstitial fibrosis in the lungs. No evidence of active consolidation.  Original Report Authenticated By: Marlon Pel, M.D.    Medications: Scheduled Meds:    . aspirin  325 mg Oral Daily  . diltiazem  60 mg Oral QID  . Fluticasone-Salmeterol  1 puff Inhalation Q12H  . furosemide  40 mg Intravenous Once  . heparin  5,000 Units Subcutaneous Q8H  . insulin aspart  0-9 Units Subcutaneous Q4H  . insulin glargine  32 Units Subcutaneous QHS  . potassium chloride      . potassium chloride SA      . potassium chloride  40 mEq Oral Q4H  . white petrolatum      . DISCONTD: potassium chloride  40 mEq Oral Q4H  . DISCONTD: potassium chloride  40 mEq Oral Q4H  . DISCONTD: potassium chloride  40 mEq Oral Q4H   Continuous Infusions:    . sodium chloride 125 mL/hr at 02/19/11 0700  . DISCONTD: sodium chloride 10 mL/hr at 02/17/11 1238  . DISCONTD: sodium chloride 100 mL/hr (02/17/11 1700)  . DISCONTD: dextrose 5 % and 0.45% NaCl 75 mL/hr at 02/18/11 0800  . DISCONTD: diltiazem (CARDIZEM) infusion 10 mg/hr (02/18/11 0700)  . DISCONTD: insulin (NOVOLIN-R) infusion 5.5 mL/hr at 02/17/11 1945   PRN Meds:.acetaminophen, acetaminophen, albuterol, chlorpheniramine-HYDROcodone, dextrose, nitroGLYCERIN, ondansetron (ZOFRAN) IV, ondansetron, phenol  Assessment/Plan:  Active Problems:  Atrial flutter  URTI (acute upper respiratory infection)  DM (diabetes mellitus)   #1 atrial flutter: Per cardiology.   #2 acute upper respiratory infection:  Chest x-ray with no active consolidation - chronic bronchitic changes and interstitial fibrosis.  2 sons have tested positive for influenza, but his influenza PCR screen was negative. Chest x-ray from 12/11 also showed no evidence for bacterial infection/pneumonia.  No indication for antibiotics at this time.  #3 mild DKA/DM:  DKA resolved, continue Lantus, continue sliding scale coverage.It appears patient did not take his Lantus the day prior to admission and then he refused Lantus 12/10. His acidosis is now resolved and-and CO2 21  #4 hypokalemia- resolved  #5 hyponatremia - now worsened following increased fluids -fluids decreased, will diurese and follow.  LOS: 4 days   Chenay Nesmith C [facility] Pager:  [phone] 02/19/2011, 8:28 AM

## 2011-02-20 ENCOUNTER — Inpatient Hospital Stay (HOSPITAL_COMMUNITY): Payer: BC Managed Care – PPO

## 2011-02-20 ENCOUNTER — Other Ambulatory Visit: Payer: Self-pay

## 2011-02-20 DIAGNOSIS — R0902 Hypoxemia: Secondary | ICD-10-CM

## 2011-02-20 DIAGNOSIS — J449 Chronic obstructive pulmonary disease, unspecified: Secondary | ICD-10-CM

## 2011-02-20 DIAGNOSIS — J441 Chronic obstructive pulmonary disease with (acute) exacerbation: Secondary | ICD-10-CM

## 2011-02-20 DIAGNOSIS — J13 Pneumonia due to Streptococcus pneumoniae: Secondary | ICD-10-CM

## 2011-02-20 DIAGNOSIS — J154 Pneumonia due to other streptococci: Secondary | ICD-10-CM

## 2011-02-20 LAB — GLUCOSE, CAPILLARY
Glucose-Capillary: 197 mg/dL — ABNORMAL HIGH (ref 70–99)
Glucose-Capillary: 312 mg/dL — ABNORMAL HIGH (ref 70–99)
Glucose-Capillary: 330 mg/dL — ABNORMAL HIGH (ref 70–99)
Glucose-Capillary: 343 mg/dL — ABNORMAL HIGH (ref 70–99)
Glucose-Capillary: 84 mg/dL (ref 70–99)

## 2011-02-20 LAB — BASIC METABOLIC PANEL
CO2: 26 mEq/L (ref 19–32)
Calcium: 7.8 mg/dL — ABNORMAL LOW (ref 8.4–10.5)
GFR calc non Af Amer: >90 mL/min (ref >90)
Glucose, Bld: 89 mg/dL (ref 70–99)
Potassium: 3.6 mEq/L (ref 3.5–5.1)
Sodium: 129 mEq/L — ABNORMAL LOW (ref 135–145)

## 2011-02-20 MED ORDER — INSULIN ASPART 100 UNIT/ML ~~LOC~~ SOLN
0.0000 [IU] | Freq: Three times a day (TID) | SUBCUTANEOUS | Status: DC
  Administered 2011-02-20: 7 [IU] via SUBCUTANEOUS
  Administered 2011-02-20: 2 [IU] via SUBCUTANEOUS
  Administered 2011-02-21: 5 [IU] via SUBCUTANEOUS
  Administered 2011-02-21: 7 [IU] via SUBCUTANEOUS
  Administered 2011-02-21: 9 [IU] via SUBCUTANEOUS
  Filled 2011-02-20: qty 3

## 2011-02-20 MED ORDER — INSULIN ASPART 100 UNIT/ML ~~LOC~~ SOLN
5.0000 [IU] | Freq: Once | SUBCUTANEOUS | Status: AC
  Administered 2011-02-20: 5 [IU] via SUBCUTANEOUS

## 2011-02-20 MED ORDER — ENOXAPARIN SODIUM 40 MG/0.4ML ~~LOC~~ SOLN
40.0000 mg | SUBCUTANEOUS | Status: DC
  Administered 2011-02-20 – 2011-02-21 (×2): 40 mg via SUBCUTANEOUS
  Filled 2011-02-20 (×3): qty 0.4

## 2011-02-20 MED ORDER — ALBUTEROL SULFATE (5 MG/ML) 0.5% IN NEBU
2.5000 mg | INHALATION_SOLUTION | Freq: Four times a day (QID) | RESPIRATORY_TRACT | Status: DC
  Administered 2011-02-20 (×2): 2.5 mg via RESPIRATORY_TRACT
  Filled 2011-02-20 (×3): qty 0.5

## 2011-02-20 MED ORDER — LEVALBUTEROL TARTRATE 45 MCG/ACT IN AERO
2.0000 | INHALATION_SPRAY | Freq: Four times a day (QID) | RESPIRATORY_TRACT | Status: DC
  Administered 2011-02-21 – 2011-02-23 (×7): 2 via RESPIRATORY_TRACT
  Filled 2011-02-20: qty 15

## 2011-02-20 MED ORDER — FUROSEMIDE 10 MG/ML IJ SOLN
20.0000 mg | Freq: Once | INTRAMUSCULAR | Status: DC
  Filled 2011-02-20: qty 2

## 2011-02-20 MED ORDER — FUROSEMIDE 10 MG/ML IJ SOLN
20.0000 mg | Freq: Once | INTRAMUSCULAR | Status: AC
  Administered 2011-02-20: 20 mg via INTRAVENOUS
  Filled 2011-02-20: qty 2

## 2011-02-20 MED ORDER — IPRATROPIUM BROMIDE 0.02 % IN SOLN
0.5000 mg | Freq: Four times a day (QID) | RESPIRATORY_TRACT | Status: DC
  Administered 2011-02-20 (×2): 0.5 mg via RESPIRATORY_TRACT
  Filled 2011-02-20 (×3): qty 2.5

## 2011-02-20 MED ORDER — FUROSEMIDE 10 MG/ML IJ SOLN
40.0000 mg | Freq: Once | INTRAMUSCULAR | Status: DC
  Filled 2011-02-20: qty 4

## 2011-02-20 MED ORDER — INSULIN GLARGINE 100 UNIT/ML ~~LOC~~ SOLN
24.0000 [IU] | Freq: Every day | SUBCUTANEOUS | Status: DC
  Administered 2011-02-20 – 2011-02-21 (×2): 24 [IU] via SUBCUTANEOUS

## 2011-02-20 MED ORDER — DILTIAZEM HCL ER COATED BEADS 240 MG PO CP24
240.0000 mg | ORAL_CAPSULE | Freq: Every day | ORAL | Status: DC
  Administered 2011-02-20 – 2011-02-23 (×4): 240 mg via ORAL
  Filled 2011-02-20 (×5): qty 1

## 2011-02-20 NOTE — Consult Note (Signed)
Name: Thomas Sandoval MRN: [id number] DOB: 12/12/1955  LOS: 5  PULMONARY CONSULT NOTE  History of Present Illness: 55 y/o male with a long smoking history (2-3 ppd for >30 year) presented to [facility] on 02/16/11 with a viral prodrome (nausea, vomiting, cough, slightly dyspnea) and no fever but some chills.  He was found to be in Afib with RVR and was admitted to cardiology.  He has been on dilt for rate controlled and diuresed during the admission.  Because of a CHADS2 score of 1 he has been given ASA.  Throughout the week he has had progressive cough and dyspnea with sputum production.  No fever or elevated WBC.  We were consulted for management of hypoxemia.  Lines / Drains:   Cultures / Sepsis markers: 02/17/11 Sputum: strep pneumo, sensitivities pending  Antibiotics: 12/11 Levaquin>>  Tests / Events:      Past Medical History  Diagnosis Date  . Diabetes mellitus   . Tuberculosis    I asked the patient about this diagnosis (TB) and he denies.  He has had episodes of pneumonia requiring hospitalization in the past, but never diagnosed with TB, no prolonged antibiotic use in the past.  History reviewed. No pertinent past surgical history. Prior to Admission medications   Medication Sig Start Date End Date Taking? Authorizing Provider  Fluticasone-Salmeterol (ADVAIR) 250-50 MCG/DOSE AEPB Inhale 1 puff into the lungs every 12 (twelve) hours.     Yes Historical Provider, MD  insulin aspart (NOVOLOG) 100 UNIT/ML injection Inject 2-8 Units into the skin 3 (three) times daily before meals. Take as directed per sliding scale    Yes Historical Provider, MD  insulin glargine (LANTUS) 100 UNIT/ML injection Inject 32 Units into the skin at bedtime.     Yes Historical Provider, MD  albuterol (PROVENTIL HFA;VENTOLIN HFA) 108 (90 BASE) MCG/ACT inhaler Inhale 2 puffs into the lungs every 4 (four) hours as needed for wheezing. 02/15/11 02/15/12  Suzi Roots, MD  ondansetron (ZOFRAN) 8 MG tablet  Take 1 tablet (8 mg total) by mouth every 8 (eight) hours as needed for nausea. 02/15/11 02/22/11  Suzi Roots, MD   No Known Allergies No family history on file. Social History  reports that he has been smoking.  He does not have any smokeless tobacco history on file. He reports that he does not drink alcohol or use illicit drugs.  Review Of Systems   Gen: Denies fever, + chills, weight change, fatigue, night sweats HEENT: Denies blurred vision, double vision, hearing loss, tinnitus, sinus congestion, rhinorrhea, sore throat, neck stiffness, dysphagia PULM: per hpi CV: Denies chest pain, + edema, orthopnea, paroxysmal nocturnal dyspnea, palpitations GI: Denies abdominal pain, nausea, vomiting, diarrhea, hematochezia, melena, constipation, change in bowel habits GU: Denies dysuria, hematuria, polyuria, oliguria, urethral discharge Endocrine: Denies hot or cold intolerance, polyuria, polyphagia or appetite change Derm: Denies rash, dry skin, scaling or peeling skin change Heme: Denies easy bruising, bleeding, bleeding gums Neuro: Denies headache, numbness, weakness, slurred speech, loss of memory or consciousness  Vital Signs:   Filed Vitals:   02/20/11 0800 02/20/11 0848 02/20/11 1248 02/20/11 1252  BP: 129/57  90/57 113/55  Pulse: 108  129 139  Temp: 98.2 F (36.8 C)     TempSrc: Oral     Resp: 33  37 25  Height:      Weight:      SpO2: 93% 93% 91% 91%     Physical Examination: Gen:  no acute distress, no accessory muscle  use HEENT: NCAT, PERRL, EOMi, OP clear,  Neck: supple without masses PULM: Scattered rhonchi, frequent cough, no wheezing or crackles, good air movement CV: Irreg, irreg, no mgr, no JVD AB: BS+, soft, nontender, no hsm Ext: warm, no edema, no clubbing, no cyanosis Derm: no rash or skin breakdown Neuro: A&Ox4, CN II-XII intact, strength 5/5 in all 4 extremities Psyche: Normal mood and affect  Labs and Imaging:   02/20/11 CXR: hyperinflation, patchy  airspace disease and interstitial opacities  CBC    Component Value Date/Time   WBC 9.7 02/18/2011 0420   RBC 3.69* 02/18/2011 0420   HGB 11.5* 02/18/2011 0420   HCT 33.0* 02/18/2011 0420   PLT 165 02/18/2011 0420   MCV 89.4 02/18/2011 0420   MCH 31.2 02/18/2011 0420   MCHC 34.8 02/18/2011 0420   RDW 12.8 02/18/2011 0420   LYMPHSABS 0.5* 02/18/2011 0420   MONOABS 0.9 02/18/2011 0420   EOSABS 0.0 02/18/2011 0420   BASOSABS 0.0 02/18/2011 0420    BMET    Component Value Date/Time   NA 129* 02/20/2011 0450   K 3.6 02/20/2011 0450   CL 96 02/20/2011 0450   CO2 26 02/20/2011 0450   GLUCOSE 89 02/20/2011 0450   BUN 12 02/20/2011 0450   CREATININE 0.80 02/20/2011 0450   CALCIUM 7.8* 02/20/2011 0450   GFRNONAA >90 02/20/2011 0450   GFRAA >90 02/20/2011 0450    .  Assessment and Plan:  55 y/o male with COPD and hypoxemia admitted with a-fib with rvr in the setting of a viral prodrome and community acquired pneumonia with sputum culture positive for s. Pneumonia.  We are consulted for hypoxemia.  He is hypoxemic from baseline COPD and superimposed community acquired pneumonia.  He may be slightly volume overloaded, but it is not overt.  He has just started antibiotics in the last 24 hours.  He should be observed for at least 24-48 hours more prior to making decisions about sending him home on home O2 as he has just started treatment for multilobar pneumonia.     Atrial flutter (02/16/2011)   Assessment: intermittent rvr   Plan:  Per cardiology  DM (diabetes mellitus) (02/16/2011)   Assessment:    Plan:  -SSI, per hospitalist  Community aquired pneumonia (02/20/2011)   Assessment: multilobar, due to strep pneumonia. not in respiratory distress.     Plan:  -levaquin for 7 days -pulm toilette -prn nebs  COPD (chronic obstructive pulmonary disease) (02/20/2011)   Assessment: not in exacerbation right now, due to tobacco abuse   Plan:  -a/a nebs qid and q2 prn -home  advair  Hypoxemia (02/20/2011)   Assessment: due to COPD and pneumonia.  Possibly volume up.  We were asked to comment on the possibility of interstitial lung disease/fibrosis.  It is really difficult to say in the acute setting with a prior viral prodrome and now an obvious pulmonary infection.  Review of prior imaging would be helpful, but would likely not change management at this point.  Favor treating infection for 48 hours or more in hospital with levaquin, and assuming clinical stability readdress whether or not home O2 is indicated and then setting up an outpatient pulmonary evaluation.   Plan:  -see above  Tobacco abuse: -advised to quit, discussed risks, I do not think that he is ready to quit as he believes that cigarettes "aren't all that bad"  PCCM will be available but will likely not see him during the weekend unless needed.  We will see  him again on Monday and help set up outpatient arrangements.  Heber [place], M.D. Pulmonary and Critical Care Medicine [facility] Pager: [phone]  02/20/2011, 1:38 PM

## 2011-02-20 NOTE — Progress Notes (Signed)
Patient Name: Thomas Sandoval Date of Encounter: 02/20/2011  Active Problems:  Atrial flutter  URTI (acute upper respiratory infection)  DM (diabetes mellitus)    SUBJECTIVE:Pt still desats and gets SOB/weak with minimal exertion. No palps, PTA would have episodes where he felt weak and HR would be up but nothing here despite multiple episodes of afib/flutter. He is coughing.   OBJECTIVE Filed Vitals:   02/20/11 0400 02/20/11 0451 02/20/11 0800 02/20/11 0848  BP:  131/52 129/57   Pulse: 94 107    Temp:  98.7 F (37.1 C)    TempSrc:  Oral    Resp: 25 30    Height:      Weight:      SpO2: 94% 94%  93%    Intake/Output Summary (Last 24 hours) at 02/20/11 0912 Last data filed at 02/20/11 0800  Gross per 24 hour  Intake   1860 ml  Output   2175 ml  Net   -315 ml   PHYSICAL EXAM  General: Slender, pale, male, +SOB Head: Normocephalic, atraumatic, sclera non-icteric, nares are without discharge.  Neck: Supple without bruits, + JVD AT 10CM. Lungs:  Bilateral dense ronchii with decrease BS and prolonged expiration Heart: RRR no s3, s4, or soft systolic murmurs. Abdomen: Soft, non-tender, non-distended, BS + x 4.  Msk:  weak Extremities: No clubbing, cyanosis, trace edema. DP/PT/Radials 2+ and equal bilaterally. Neuro: Alert and oriented X 3. Moves all extremities spontaneously. Psych: upset that he is not getting better very fast. Wants d/c  LABS: CBC: Basename 02/18/11 0420 02/17/11 1553  WBC 9.7 11.8*  NEUTROABS 8.3* --  HGB 11.5* 13.7  HCT 33.0* 38.9*  MCV 89.4 90.5  PLT 165 189   Basic Metabolic Panel: Basename 02/20/11 0450 02/19/11 1310  NA 129* 128*  K 3.6 3.7  CL 96 98  CO2 26 22  GLUCOSE 89 163*  BUN 12 14  CREATININE 0.80 0.82  CALCIUM 7.8* 7.7*  MG -- --  PHOS -- --    TELE: SR with multiple episodes of A. Fib/flutter  Radiology/Studies:  Dg Chest 2 View  02/17/2011  *RADIOLOGY REPORT*  Clinical Data: Productive cough  CHEST - 2 VIEW   Comparison: Of 02/15/2011  Findings: The heart and mediastinal contours within normal limits. Pulmonary vascularity is normal.  There is mild peribronchial thickening at the lung bases.  No confluent airspace opacities or pulmonary edema.  No visible pleural effusion.  Negative for pneumothorax.  Mild pleuroparenchymal scarring at the lung apices. No acute bony abnormality identified.   IMPRESSION: Mild peribronchial thickening at the lung bases.  Findings can be seen with acute or chronic bronchitis, smokers, or patient's with asthma.  Original Report Authenticated By: Britta Mccreedy, M.D.   Dg Chest Port 1 View 02/18/2011  *RADIOLOGY REPORT*  Clinical Data: The kidney and  PORTABLE CHEST - 1 VIEW  Comparison: 02/17/2011  Findings: Normal heart size and pulmonary vascularity. Peribronchial thickening and mild interstitial changes in the lungs consistent with chronic bronchitis and fibrosis.  No focal airspace consolidation in the lungs.  No blunting of costophrenic angles. No pneumothorax.  No significant change since previous study, allowing for technical differences.   IMPRESSION: Chronic bronchitic changes and interstitial fibrosis in the lungs. No evidence of active consolidation.  Original Report Authenticated By: Marlon Pel, M.D.    Current Medications:    . aspirin  325 mg Oral Daily  . diltiazem  60 mg Oral QID  . Fluticasone-Salmeterol  1  puff Inhalation Q12H  . furosemide  40 mg Intravenous Once  . heparin  5,000 Units Subcutaneous Q8H  . insulin aspart  0-9 Units Subcutaneous Q4H  . insulin glargine  32 Units Subcutaneous QHS  . moxifloxacin  400 mg Oral q1800    ASSESSMENT AND PLAN: Patient Active Problem List  Diagnoses  . Atrial flutter - intermittent - ?2nd to activity/coughing - change dilt to CD, watch rhythm/rate  . URTI (acute upper respiratory infection) - ABX started by Pulm  . DM (diabetes mellitus) - IM following   Pulm fibrosis/low O2 sats - pulm consult    Volume overload - IV lasix today, follow volume status, re-check CXR, EF nl by echo but moderate LVH   Plan: Tx telemetry, increase activity as pt will tolerate - continue current therapies, d/c when medically stable.    Signed, Theodore Demark , PA-C  Patient seen and examined with Theodore Demark, PA-C.  The patient is tired of being in the hospital, but his wife is maintaining a good attitude.  He has substantial cough, and exam reflects that.  He has had intermittent atrial fibrillation.  He can be transferred today.  Glucoses are fluctuating.  Has S. Pneumoniae in sputum, so antibiotics started by [facility].  Will get pulmonary consult to evaluate given his desaturation, and recheck a CXR now that he is hydrated.  He will remain on calcium antagonists. Will shift to daily Lovenox since he is getting frequent insulin as well.    Shawnie Pons 02/20/2011 12:58 PM

## 2011-02-20 NOTE — Progress Notes (Signed)
Inpatient Diabetes Program Recommendations  AACE/ADA: New Consensus Statement on Inpatient Glycemic Control (2009)  Target Ranges:  Prepandial:   less than 140 mg/dL      Peak postprandial:   less than 180 mg/dL (1-2 hours)      Critically ill patients:  140 - 180 mg/dL   Reason for Visit: Note Hypoglycemia this am.  Lantus reduced today.  Likely needs meal coverage to cover carbohydrate intake.  Inpatient Diabetes Program Recommendations Insulin - Basal: x Correction (SSI): x Insulin - Meal Coverage: Please add Novolog meal coverage 4 units tid with meals.

## 2011-02-20 NOTE — Progress Notes (Signed)
When getting to edge of bed to eat lunch, pt's HR back in a fib rate 130s-170s.  Pt more short of breath, sats down to 88%, O2 increased to 6L with improvement in O2 sats up to 94%.  Pt repositioned back in bed.  Theodore Demark PA notified of above.  Transfer order cancelled.  No other orders received at this time.  Pt's HR down to 110s-120s.  BP 113/55.  Will continue to monitor.   Roselie Awkward, RN

## 2011-02-20 NOTE — Progress Notes (Signed)
Subjective: Pt sitting up in bed, states he feels better today- not as tired,no further nausea, more talkative. Still SOB with min. Activity, coughing overnight.   Objective: Vital signs in last 24 hours: Temp:  [98.2 F (36.8 C)-99.3 F (37.4 C)] 98.2 F (36.8 C) (12/14 0800) Pulse Rate:  [94-139] 139  (12/14 1252) Resp:  [19-37] 25  (12/14 1252) BP: (90-131)/(52-63) 113/55 mmHg (12/14 1252) SpO2:  [89 %-95 %] 91 % (12/14 1252) FiO2 (%):  [3 %] 3 % (12/14 0000) Weight change:  Last BM Date: 02/18/11  Intake/Output from previous day: 12/13 0701 - 12/14 0700 In: 2080 [P.O.:240; I.V.:1840] Out: 2925 [Urine:2925] Total I/O In: 30 [I.V.:30] Out: 400 [Urine:400]   Physical Exam: General: alert, sitting up in bed., oriented x3 HEENT: No bruits, no goiter. Heart: Regular rate and rhythm, without murmurs, rubs, gallops. Lungs: Clear to auscultation bilaterally. Decreased BS at bases, no wheezes. Abdomen: Soft, nontender, nondistended, positive bowel sounds. Extremities: No clubbing cyanosis or edema  Neuro: Grossly intact, nonfocal.    Lab Results: Basic Metabolic Panel:  Basename 02/20/11 0450 02/19/11 1310  NA 129* 128*  K 3.6 3.7  CL 96 98  CO2 26 22  GLUCOSE 89 163*  BUN 12 14  CREATININE 0.80 0.82  CALCIUM 7.8* 7.7*  MG -- --  PHOS -- --   Liver Function Tests: No results found for this basename: AST:2,ALT:2,ALKPHOS:2,BILITOT:2,PROT:2,ALBUMIN:2 in the last 72 hours CBC:  Basename 02/18/11 0420 02/17/11 1553  WBC 9.7 11.8*  NEUTROABS 8.3* --  HGB 11.5* 13.7  HCT 33.0* 38.9*  MCV 89.4 90.5  PLT 165 189   CBG:  Basename 02/20/11 1218 02/20/11 0808 02/20/11 0736 02/20/11 0446 02/20/11 0034 02/19/11 2044  GLUCAP 166* 84 53* 87 197* 312*   Thyroid Function Tests: No results found for this basename: TSH,T4TOTAL,FREET4,T3FREE,THYROIDAB in the last 72 hours Coagulation: No results found for this basename: LABPROT:2,INR:2 in the last 72 hours Urine Drug  Screen: Drugs of Abuse     Component Value Date/Time   LABOPIA NEGATIVE 04/28/2009 0547   COCAINSCRNUR NEGATIVE 04/28/2009 0547   LABBENZ NEGATIVE 04/28/2009 0547   AMPHETMU NEGATIVE 04/28/2009 0547     Recent Results (from the past 240 hour(s))  CULTURE, SPUTUM-ASSESSMENT     Status: Normal   Collection Time   02/17/11  1:26 PM      Component Value Range Status Comment   Specimen Description SPUTUM   Final    Special Requests NONE   Final    Sputum evaluation     Final    Value: THIS SPECIMEN IS ACCEPTABLE. RESPIRATORY CULTURE REPORT TO FOLLOW.   Report Status 02/17/2011 FINAL   Final   CULTURE, RESPIRATORY     Status: Normal (Preliminary result)   Collection Time   02/17/11  1:26 PM      Component Value Range Status Comment   Specimen Description SPUTUM   Final    Special Requests NONE   Final    Gram Stain     Final    Value: ABUNDANT WBC PRESENT, PREDOMINANTLY PMN     NO SQUAMOUS EPITHELIAL CELLS SEEN     ABUNDANT GRAM POSITIVE COCCI     IN PAIRS   Culture ABUNDANT STREPTOCOCCUS PNEUMONIAE   Final    Report Status PENDING   Incomplete   MRSA PCR SCREENING     Status: Normal   Collection Time   02/17/11  5:20 PM      Component Value Range Status Comment  MRSA by PCR NEGATIVE  NEGATIVE  Final     Studies/Results: Dg Chest 2 View  02/20/2011  *RADIOLOGY REPORT*  Clinical Data: Shortness of breath.  Weakness.  CHEST - 2 VIEW  Comparison: 02/17/2011.  02/18/2011.  Findings: Cardiac silhouette is normal in size.  There is slight prominence of the main pulmonary artery segment.  There is an overall hyperinflation configuration with low-lying diaphragm. There is slight upper lobe vascular prominence.  There is an increase in the reticular interstitial markings.  Patchy infiltrative densities are seen in the upper lobes and lung bases. There is blunting of the posterior costophrenic angles consist with small amounts of pleural effusion with associated atelectasis and infiltrate.   There is some central peribronchial thickening.  IMPRESSION: Prominence of main pulmonary artery segment.  Overall hyperinflation configuration.  Mild upper lobe vascular prominence. Normal sized heart.  Increased reticular interstitial markings. Patchy infiltrative densities without consolidation in the upper lobes and in the lung bases.  Blunting of both posterior costophrenic angles consistent with small amounts of pleural effusion.  There is associated atelectasis and infiltrative density.  Central peribronchial thickening. This may be associated with bronchitis, asthma, and reactive airway disease.  Original Report Authenticated By: Crawford Givens, M.D.    Medications: Scheduled Meds:    . aspirin  325 mg Oral Daily  . diltiazem  240 mg Oral Daily  . enoxaparin (LOVENOX) injection  40 mg Subcutaneous Q24H  . Fluticasone-Salmeterol  1 puff Inhalation Q12H  . furosemide  20 mg Intravenous Once  . furosemide  40 mg Intravenous Once  . insulin aspart  0-9 Units Subcutaneous TID WC  . insulin glargine  24 Units Subcutaneous QHS  . moxifloxacin  400 mg Oral q1800  . DISCONTD: diltiazem  60 mg Oral QID  . DISCONTD: heparin  5,000 Units Subcutaneous Q8H  . DISCONTD: insulin aspart  0-9 Units Subcutaneous Q4H  . DISCONTD: insulin glargine  32 Units Subcutaneous QHS   Continuous Infusions:    . DISCONTD: sodium chloride 30 mL/hr at 02/20/11 0800   PRN Meds:.acetaminophen, acetaminophen, albuterol, chlorpheniramine-HYDROcodone, nitroGLYCERIN, ondansetron (ZOFRAN) IV, ondansetron, phenol, DISCONTD: dextrose  Assessment/Plan:  Active Problems:  Atrial flutter  URTI (acute upper respiratory infection)  DM (diabetes mellitus)   #1 atrial flutter: Per cardiology.   #2 acute upper respiratory infection: and now withChest x-ray with no active consolidation - chronic bronchitic changes and interstitial fibrosis.  2 sons have tested positive for influenza, but his influenza PCR screen was  negative. Chest x-ray from 12/11 also showed no evidence for bacterial infection/pneumonia. Sputum with pneumococcus and pt started on oral abx per CCM- now consulted per primary team as pt noted to be desatting. Agree with BNP, will again diurese today and follow.    #3 Uncontrolled DM with hypoglycemic episodes: Agree with decreasing lantus dose, continue monitoring BGs and further treat accordingly. #4 hypokalemia-resolved.  #5 hyponatremia - improving off IVF and with diuresis, will give lasix another dose of lasix today, BNP in am as above.   LOS: 5 days   Kela Millin [facility] Pager: [phone] 02/20/2011, 1:22 PM

## 2011-02-21 ENCOUNTER — Inpatient Hospital Stay (HOSPITAL_COMMUNITY): Payer: BC Managed Care – PPO

## 2011-02-21 ENCOUNTER — Encounter (HOSPITAL_COMMUNITY): Payer: Self-pay | Admitting: *Deleted

## 2011-02-21 DIAGNOSIS — I4892 Unspecified atrial flutter: Secondary | ICD-10-CM

## 2011-02-21 DIAGNOSIS — D649 Anemia, unspecified: Secondary | ICD-10-CM

## 2011-02-21 LAB — CBC
HCT: 15.4 % — ABNORMAL LOW (ref 39.0–52.0)
Hemoglobin: 5.5 g/dL — CL (ref 13.0–17.0)
MCH: 31.6 pg (ref 26.0–34.0)
MCHC: 35.7 g/dL (ref 30.0–36.0)
RDW: 12.8 % (ref 11.5–15.5)

## 2011-02-21 LAB — BASIC METABOLIC PANEL
BUN: 17 mg/dL (ref 6–23)
Creatinine, Ser: 0.89 mg/dL (ref 0.50–1.35)
GFR calc Af Amer: >90 mL/min (ref >90)
GFR calc non Af Amer: >90 mL/min (ref >90)
Glucose, Bld: 304 mg/dL — ABNORMAL HIGH (ref 70–99)
Potassium: 4.1 mEq/L (ref 3.5–5.1)

## 2011-02-21 LAB — GLUCOSE, CAPILLARY
Glucose-Capillary: 309 mg/dL — ABNORMAL HIGH (ref 70–99)
Glucose-Capillary: 396 mg/dL — ABNORMAL HIGH (ref 70–99)
Glucose-Capillary: 394 mg/dL — ABNORMAL HIGH (ref 70–99)

## 2011-02-21 MED ORDER — INSULIN ASPART 100 UNIT/ML ~~LOC~~ SOLN
0.0000 [IU] | Freq: Every day | SUBCUTANEOUS | Status: DC
  Administered 2011-02-21 – 2011-02-22 (×2): 5 [IU] via SUBCUTANEOUS

## 2011-02-21 MED ORDER — FUROSEMIDE 10 MG/ML IJ SOLN
20.0000 mg | Freq: Once | INTRAMUSCULAR | Status: AC
  Administered 2011-02-21: 20 mg via INTRAVENOUS
  Filled 2011-02-21: qty 2

## 2011-02-21 MED ORDER — INSULIN ASPART 100 UNIT/ML ~~LOC~~ SOLN
0.0000 [IU] | Freq: Three times a day (TID) | SUBCUTANEOUS | Status: DC
  Administered 2011-02-22 (×2): 15 [IU] via SUBCUTANEOUS
  Administered 2011-02-22: 11 [IU] via SUBCUTANEOUS
  Administered 2011-02-23: 15 [IU] via SUBCUTANEOUS

## 2011-02-21 NOTE — Progress Notes (Signed)
Pt HR continues to be in the 120's-130's and appears to be afib-aflutter. Cardiology MD notified and orders received. Pt denies any chest pain. Will cont to monitor

## 2011-02-21 NOTE — Consult Note (Signed)
Referring Provider: Dr. Suanne Marker  Primary Care Physician:  Provider Not In System Primary Gastroenterologist: Unassigned  Reason for Consultation:  Anemia  HPI: Thomas Sandoval is a 55 y.o. male admitted for N/V and found to be in aflutter with COPD and pneumonia being seen as a consult due to significant drop in his Hgb from 13.7 (on 02/17/11) to 5.5 today. Hgb was rechecked when it was 5.9 and result was 5.5. No overt bleeding and heme negative. N/V prior to admit but denies abdominal pain or black stools. Reports chronic use of NSAIDs in the form of Motrin 2-3 times per day for years for joint pains. Sitting on the side of the bed eating lunch without any acute distress.  Past Medical History  Diagnosis Date  . Diabetes mellitus   . Tuberculosis     History reviewed. No pertinent past surgical history.  Prior to Admission medications   Medication Sig Start Date End Date Taking? Authorizing Provider  Fluticasone-Salmeterol (ADVAIR) 250-50 MCG/DOSE AEPB Inhale 1 puff into the lungs every 12 (twelve) hours.     Yes Historical Provider, MD  insulin aspart (NOVOLOG) 100 UNIT/ML injection Inject 2-8 Units into the skin 3 (three) times daily before meals. Take as directed per sliding scale    Yes Historical Provider, MD  insulin glargine (LANTUS) 100 UNIT/ML injection Inject 32 Units into the skin at bedtime.     Yes Historical Provider, MD  albuterol (PROVENTIL HFA;VENTOLIN HFA) 108 (90 BASE) MCG/ACT inhaler Inhale 2 puffs into the lungs every 4 (four) hours as needed for wheezing. 02/15/11 02/15/12  Suzi Roots, MD  ondansetron (ZOFRAN) 8 MG tablet Take 1 tablet (8 mg total) by mouth every 8 (eight) hours as needed for nausea. 02/15/11 02/22/11  Suzi Roots, MD    Scheduled Meds:   . aspirin  325 mg Oral Daily  . diltiazem  240 mg Oral Daily  . enoxaparin (LOVENOX) injection  40 mg Subcutaneous Q24H  . Fluticasone-Salmeterol  1 puff Inhalation Q12H  . furosemide  20 mg Intravenous  Once  . furosemide  20 mg Intravenous Once  . insulin aspart  0-9 Units Subcutaneous TID WC  . insulin aspart  5 Units Subcutaneous Once  . insulin glargine  24 Units Subcutaneous QHS  . levalbuterol  2 puff Inhalation Q6H  . moxifloxacin  400 mg Oral q1800  . DISCONTD: albuterol  2.5 mg Nebulization QID  . DISCONTD: furosemide  20 mg Intravenous Once  . DISCONTD: furosemide  40 mg Intravenous Once  . DISCONTD: ipratropium  0.5 mg Nebulization QID   Continuous Infusions:  PRN Meds:.acetaminophen, acetaminophen, chlorpheniramine-HYDROcodone, nitroGLYCERIN, ondansetron (ZOFRAN) IV, ondansetron, phenol, DISCONTD: albuterol  Allergies as of 02/15/2011  . (No Known Allergies)    No family history on file.  History   Social History  . Marital Status: Married    Spouse Name: N/A    Number of Children: N/A  . Years of Education: N/A   Occupational History  . Not on file.   Social History Main Topics  . Smoking status: Current Everyday Smoker  . Smokeless tobacco: Not on file  . Alcohol Use: No  . Drug Use: No  . Sexually Active:    Other Topics Concern  . Not on file   Social History Narrative  . No narrative on file    Review of Systems: All negative except as stated above in HPI.  Physical Exam: Vital signs: Filed Vitals:   02/21/11 1300  BP: 126/57  Pulse: 102  Temp: 98.8 F (37.1 C)  Resp: 27   Last BM Date: 02/18/11 General:   Alert,  Well-developed, well-nourished, pleasant and cooperative in NAD Abdomen: soft, nontender, nondistended, positive bowel sounds  Rectal:  Deferred  GI:  Lab Results:  Basename 02/21/11 1013  WBC 11.1*  HGB 5.5*  HCT 15.4*  PLT 211   BMET  Basename 02/21/11 1013 02/20/11 0450 02/19/11 1310  NA 126* 129* 128*  K 4.1 3.6 3.7  CL 90* 96 98  CO2 27 26 22   GLUCOSE 304* 89 163*  BUN 17 12 14   CREATININE 0.89 0.80 0.82  CALCIUM 7.9* 7.8* 7.7*   LFT No results found for this basename:  PROT,ALBUMIN,AST,ALT,ALKPHOS,BILITOT,BILIDIR,IBILI in the last 72 hours PT/INR No results found for this basename: LABPROT:2,INR:2 in the last 72 hours   Studies/Results: Dg Chest 2 View  02/20/2011  *RADIOLOGY REPORT*  Clinical Data: Shortness of breath.  Weakness.  CHEST - 2 VIEW  Comparison: 02/17/2011.  02/18/2011.  Findings: Cardiac silhouette is normal in size.  There is slight prominence of the main pulmonary artery segment.  There is an overall hyperinflation configuration with low-lying diaphragm. There is slight upper lobe vascular prominence.  There is an increase in the reticular interstitial markings.  Patchy infiltrative densities are seen in the upper lobes and lung bases. There is blunting of the posterior costophrenic angles consist with small amounts of pleural effusion with associated atelectasis and infiltrate.  There is some central peribronchial thickening.  IMPRESSION: Prominence of main pulmonary artery segment.  Overall hyperinflation configuration.  Mild upper lobe vascular prominence. Normal sized heart.  Increased reticular interstitial markings. Patchy infiltrative densities without consolidation in the upper lobes and in the lung bases.  Blunting of both posterior costophrenic angles consistent with small amounts of pleural effusion.  There is associated atelectasis and infiltrative density.  Central peribronchial thickening. This may be associated with bronchitis, asthma, and reactive airway disease.  Original Report Authenticated By: Crawford Givens, M.D.    Impression/Plan: 55yo with a significant drop in his Hgb without any obvious source found. No overt GI bleeding and heme negative. Chronic NSAIDs and recent N/V but no abdominal pain or melena to suggest upper GI source. Question retroperitoneal source but puzzling in that if that is the source because of lack of change in his hemodynamics with an over 8 gram Hgb drop. Agree with CT abd/pelvis. Hold off on GI procedures at  this time and will follow. Agree with transfusion. Supportive care.     LOS: 6 days   Sulema Braid C.  02/21/2011, 2:25 PM

## 2011-02-21 NOTE — Progress Notes (Addendum)
Despite attempts to educated pt on urgency of blood TX, Pt wants to wait for wife to arrive.  I asked the charge RN to relay education piece as well but to no avail.  Will notify Dr. Donna Bernard.  Arletha Grippe

## 2011-02-21 NOTE — Progress Notes (Signed)
Pt HR now in the 100's to 110's and pt is asleep. Continues to be in afib/NSR. Cardiology aware and no new orders at this time.   CCM also made aware of cardiac change post-breathing treatment this evening. Nebs changed to xopenex. Will continue to monitor the patient.   Primary was made aware of all of the changes and no new orders from M. Lynch.

## 2011-02-21 NOTE — Progress Notes (Signed)
Pt CBG was 404 at 2130, TRH Lynch notified of this and new orders received at this time. Pt also given scheduled 24 units of lantus. Will continue to monitor.

## 2011-02-21 NOTE — Progress Notes (Signed)
Will d/c asa Avoid other meds until the issue with the anemia is clarified

## 2011-02-21 NOTE — Progress Notes (Addendum)
Subjective: Still with cough overnight, denies nausea vomiting, still with shortness of breath. Objective: Vital signs in last 24 hours: Temp:  [97.9 F (36.6 C)-99.1 F (37.3 C)] 98.8 F (37.1 C) (12/15 1300) Pulse Rate:  [90-116] 102  (12/15 1300) Resp:  [24-31] 27  (12/15 1300) BP: (110-126)/(47-57) 126/57 mmHg (12/15 1300) SpO2:  [92 %-99 %] 99 % (12/15 1300) Weight:  [71 kg (156 lb 8.4 oz)] 156 lb 8.4 oz (71 kg) (12/15 0500) Weight change:  Last BM Date: 02/18/11  Intake/Output from previous day: 12/14 0701 - 12/15 0700 In: 270 [P.O.:240; I.V.:30] Out: 2750 [Urine:2750] Total I/O In: -  Out: 950 [Urine:950]   Physical Exam: General: alert, sitting up in bed., oriented x3 HEENT: No bruits, no goiter. Heart: Regular rate and rhythm, without murmurs, rubs, gallops. Lungs: Rhonchi present bilaterally.. Decreased BS at bases, no wheezes. Abdomen: Soft, nontender, nondistended, positive bowel sounds. Extremities: No clubbing cyanosis or edema  Neuro: Grossly intact, nonfocal.    Lab Results: Basic Metabolic Panel:  Basename 02/21/11 1013 02/20/11 0450  NA 126* 129*  K 4.1 3.6  CL 90* 96  CO2 27 26  GLUCOSE 304* 89  BUN 17 12  CREATININE 0.89 0.80  CALCIUM 7.9* 7.8*  MG -- --  PHOS -- --   Liver Function Tests: No results found for this basename: AST:2,ALT:2,ALKPHOS:2,BILITOT:2,PROT:2,ALBUMIN:2 in the last 72 hours CBC:  Basename 02/21/11 1013  WBC 11.1*  NEUTROABS --  HGB 5.5*  HCT 15.4*  MCV 88.5  PLT 211   CBG:  Basename 02/21/11 1220 02/21/11 0818 02/20/11 2348 02/20/11 2022 02/20/11 1643 02/20/11 1218  GLUCAP 289* 309* 368* 404* 343* 166*   Thyroid Function Tests: No results found for this basename: TSH,T4TOTAL,FREET4,T3FREE,THYROIDAB in the last 72 hours Coagulation: No results found for this basename: LABPROT:2,INR:2 in the last 72 hours Urine Drug Screen: Drugs of Abuse     Component Value Date/Time   LABOPIA NEGATIVE 04/28/2009  0547   COCAINSCRNUR NEGATIVE 04/28/2009 0547   LABBENZ NEGATIVE 04/28/2009 0547   AMPHETMU NEGATIVE 04/28/2009 0547     Recent Results (from the past 240 hour(s))  CULTURE, SPUTUM-ASSESSMENT     Status: Normal   Collection Time   02/17/11  1:26 PM      Component Value Range Status Comment   Specimen Description SPUTUM   Final    Special Requests NONE   Final    Sputum evaluation     Final    Value: THIS SPECIMEN IS ACCEPTABLE. RESPIRATORY CULTURE REPORT TO FOLLOW.   Report Status 02/17/2011 FINAL   Final   CULTURE, RESPIRATORY     Status: Normal (Preliminary result)   Collection Time   02/17/11  1:26 PM      Component Value Range Status Comment   Specimen Description SPUTUM   Final    Special Requests NONE   Final    Gram Stain     Final    Value: ABUNDANT WBC PRESENT, PREDOMINANTLY PMN     NO SQUAMOUS EPITHELIAL CELLS SEEN     ABUNDANT GRAM POSITIVE COCCI     IN PAIRS   Culture ABUNDANT STREPTOCOCCUS PNEUMONIAE   Final    Report Status PENDING   Incomplete   MRSA PCR SCREENING     Status: Normal   Collection Time   02/17/11  5:20 PM      Component Value Range Status Comment   MRSA by PCR NEGATIVE  NEGATIVE  Final     Studies/Results: Dg  Chest 2 View  02/20/2011  *RADIOLOGY REPORT*  Clinical Data: Shortness of breath.  Weakness.  CHEST - 2 VIEW  Comparison: 02/17/2011.  02/18/2011.  Findings: Cardiac silhouette is normal in size.  There is slight prominence of the main pulmonary artery segment.  There is an overall hyperinflation configuration with low-lying diaphragm. There is slight upper lobe vascular prominence.  There is an increase in the reticular interstitial markings.  Patchy infiltrative densities are seen in the upper lobes and lung bases. There is blunting of the posterior costophrenic angles consist with small amounts of pleural effusion with associated atelectasis and infiltrate.  There is some central peribronchial thickening.  IMPRESSION: Prominence of main  pulmonary artery segment.  Overall hyperinflation configuration.  Mild upper lobe vascular prominence. Normal sized heart.  Increased reticular interstitial markings. Patchy infiltrative densities without consolidation in the upper lobes and in the lung bases.  Blunting of both posterior costophrenic angles consistent with small amounts of pleural effusion.  There is associated atelectasis and infiltrative density.  Central peribronchial thickening. This may be associated with bronchitis, asthma, and reactive airway disease.  Original Report Authenticated By: Crawford Givens, M.D.   Pro brain natruretic peptide -595.9 Medications: Scheduled Meds:    . aspirin  325 mg Oral Daily  . diltiazem  240 mg Oral Daily  . enoxaparin (LOVENOX) injection  40 mg Subcutaneous Q24H  . Fluticasone-Salmeterol  1 puff Inhalation Q12H  . furosemide  20 mg Intravenous Once  . furosemide  20 mg Intravenous Once  . insulin aspart  0-9 Units Subcutaneous TID WC  . insulin aspart  5 Units Subcutaneous Once  . insulin glargine  24 Units Subcutaneous QHS  . levalbuterol  2 puff Inhalation Q6H  . moxifloxacin  400 mg Oral q1800  . DISCONTD: albuterol  2.5 mg Nebulization QID  . DISCONTD: furosemide  20 mg Intravenous Once  . DISCONTD: furosemide  40 mg Intravenous Once  . DISCONTD: ipratropium  0.5 mg Nebulization QID   Continuous Infusions:   PRN Meds:.acetaminophen, acetaminophen, chlorpheniramine-HYDROcodone, nitroGLYCERIN, ondansetron (ZOFRAN) IV, ondansetron, phenol, DISCONTD: albuterol  Assessment/Plan:  Active Problems:  Atrial flutter/fib  URTI (acute upper respiratory infection)  DM (diabetes mellitus)  Community aquired pneumonia  COPD (chronic obstructive pulmonary disease)  Hypoxemia  Anemia   #1 atrial flutter: Per cardiology.   #2 pneumonia, multilobar- sputum with pneumococcus, continue antibiotics   #COPD/hypoxia - continue bronchodilators, Advair, antibiotics as above and supplemental.  Patient seen by pulmonology and outpatient workup for ? Interstitial lung disease/fibrosis recommended. A brain natruretic peptide of only mildly elevated at 595.9, he received Lasix  when necessary basis. #3 Uncontrolled DM - hypoglycemic episodes, no further hypoglycemic episodes overnight but her glucose poorly controlled. We'll titrate up Lantus dose and follow #4 hypokalemia-resolved.  #5 hyponatremia - likely component  Of SIADH secondary to #2, fluid restrict and follow.   LOS: 6 days   Ashaunti Treptow C [facility] Pager: [phone] 02/21/2011, 1:34 PM   Addendum CBC done initially this a.m. came back at 5.9 and a repeat CBC 5.5 at the, his last hemoglobin on 12/12 was 11.5. His Hemoccult is negative. He has had no hematemesis melena or hematochezia reported. Will obtain a CT scan of abdomen and pelvis without contrast to evaluate for possible retroperitoneal bleed. I have also consulted gastroenterology and Dr. Bosie Clos to see patient. We'll transfuse packed red blood cells with Lasix between units, monitor serial H&H.

## 2011-02-21 NOTE — Progress Notes (Signed)
Spoke with patient about necessity of blood transfusion and blood consent form.  Patient does not wish to sign blood consent form at this time. Spoke with patient about:  critically low hemoglobin and what hemoglobin does; elevated HR, increased work of breathing, and increased respiratory rate.  Patient does not wish to sign blood consent at this time.

## 2011-02-21 NOTE — Progress Notes (Signed)
Patient Name: Thomas Sandoval      SUBJECTIVE: a little frustrated with getting sicker while in hospital   Denies hematuria, no BM x 3d, no pain in back, thigh  Past Medical History  Diagnosis Date  . Diabetes mellitus   . Tuberculosis     PHYSICAL EXAM Filed Vitals:   02/21/11 0400 02/21/11 0500 02/21/11 0851 02/21/11 0912  BP: 110/53  116/47   Pulse:   99   Temp: 98.6 F (37 C)  97.9 F (36.6 C)   TempSrc: Oral  Oral   Resp:   25   Height:      Weight:  156 lb 8.4 oz (71 kg)    SpO2:   96% 98%    General appearance: alert, cooperative and moderate distress wearing O2 Neck: JVD - 7 cm above sternal notch Lungs: clear to auscultation bilaterally and but decreased with occ wheeze Heart: irregularly irregular rhythm, S1, S2 normal and no rub Abdomen: soft, non-tender; bowel sounds normal; no masses,  no organomegaly Extremities: extremities normal, atraumatic, no cyanosis or edema Skin: Skin color, texture, turgor normal. No rashes or lesions Neurologic: Alert and oriented X 3, normal strength and tone. Normal symmetric reflexes. Normal coordination and gait  TELEMETRY: Reviewed telemetry pt in NSR afib and flutter-- HR ranging 90-110:    Intake/Output Summary (Last 24 hours) at 02/21/11 1308 Last data filed at 02/21/11 0811  Gross per 24 hour  Intake    240 ml  Output   2300 ml  Net  -2060 ml    LABS: Basic Metabolic Panel:  Lab 02/21/11 1610 02/20/11 0450 02/19/11 1310 02/19/11 0410 02/18/11 0420 02/17/11 2145 02/17/11 1950 02/16/11 0916  NA 126* 129* 128* 125* 129* 128* 128* --  K 4.1 3.6 3.7 3.6 3.1* 3.4* 3.7 --  CL 90* 96 98 98 98 98 99 --  CO2 27 26 22 21 21 21 21  --  GLUCOSE 304* 89 163* 147* 155* 164* 248* --  BUN 17 12 14 14 16 20 21  --  CREATININE 0.89 0.80 0.82 0.74 0.70 0.76 0.77 --  CALCIUM 7.9* 7.8* -- -- -- -- -- --  MG -- -- -- -- -- -- -- 1.6  PHOS -- -- -- -- -- -- -- --   Cardiac Enzymes: No results found for this basename:  CKTOTAL:3,CKMB:3,CKMBINDEX:3,TROPONINI:3 in the last 72 hours CBC:  Lab 02/21/11 1013 02/18/11 0420 02/17/11 1553 02/17/11 0535 02/16/11 0916 02/15/11 1744  WBC 11.1* 9.7 11.8* 14.0* 13.5* 8.8  NEUTROABS -- 8.3* -- -- -- --  HGB 5.5* 11.5* 13.7 14.2 13.6 14.6  HCT 15.4* 33.0* 38.9* 42.0 40.9 44.0  MCV 88.5 89.4 90.5 91.7 93.0 90.9  PLT 211 165 189 201 190 197      ASSESSMENT AND PLAN Patient Active Hospital Problem List: Anemia (02/21/2011)   Assessment: this is striking and somewhat discordant from his exam  i dont know the source of his blood loss,  I doubt GI with no BM, hemolysis or kidney loss with clear urine and no pain to suggest internal bleeding.  Two samples make it hard to blame artifact, but,, the other source could be lung, but with 9gm drop I would think he would be very sick   Plan:  Atrial flutter/fib (02/16/2011)   Assessment: continue rate control   Plan:might not need long term anticoag as His CHADS VAS score is only 1 -DM Community aquired pneumonia (02/20/2011)   Assessment: per Primary/CCM   Plan:  U  Signed, Sherryl Manges MD  02/21/2011

## 2011-02-21 NOTE — Progress Notes (Signed)
Pt takes 32 units of Lantus at home. CBG still elevated. Pt was on glucommander on admit. Please address Lantus level; currently at 24 units. Patient and family inquiring.

## 2011-02-21 NOTE — Progress Notes (Signed)
Phoned Dr Fonnie Birkenhead Carla Drape results this am; 02/21/11; no new orders.

## 2011-02-22 DIAGNOSIS — J90 Pleural effusion, not elsewhere classified: Secondary | ICD-10-CM

## 2011-02-22 DIAGNOSIS — E119 Type 2 diabetes mellitus without complications: Secondary | ICD-10-CM

## 2011-02-22 DIAGNOSIS — D649 Anemia, unspecified: Secondary | ICD-10-CM

## 2011-02-22 DIAGNOSIS — R918 Other nonspecific abnormal finding of lung field: Secondary | ICD-10-CM

## 2011-02-22 LAB — HEMOGLOBIN AND HEMATOCRIT, BLOOD
HCT: 19.9 % — ABNORMAL LOW (ref 39.0–52.0)
Hemoglobin: 7.1 g/dL — ABNORMAL LOW (ref 13.0–17.0)

## 2011-02-22 LAB — BASIC METABOLIC PANEL
BUN: 15 mg/dL (ref 6–23)
Calcium: 8.1 mg/dL — ABNORMAL LOW (ref 8.4–10.5)
GFR calc Af Amer: >90 mL/min (ref >90)
GFR calc non Af Amer: >90 mL/min (ref >90)
Glucose, Bld: 344 mg/dL — ABNORMAL HIGH (ref 70–99)
Sodium: 125 mEq/L — ABNORMAL LOW (ref 135–145)

## 2011-02-22 LAB — GLUCOSE, CAPILLARY: Glucose-Capillary: 294 mg/dL — ABNORMAL HIGH (ref 70–99)

## 2011-02-22 LAB — IRON AND TIBC
Iron: 16 ug/dL — ABNORMAL LOW (ref 42–135)
Saturation Ratios: 10 % — ABNORMAL LOW (ref 20–55)
UIBC: 142 ug/dL (ref 125–400)

## 2011-02-22 LAB — CBC
Platelets: 273 10*3/uL (ref 150–400)
RBC: 2.41 MIL/uL — ABNORMAL LOW (ref 4.22–5.81)
RDW: 15 % (ref 11.5–15.5)
WBC: 11.7 10*3/uL — ABNORMAL HIGH (ref 4.0–10.5)

## 2011-02-22 LAB — DIFFERENTIAL
Basophils Absolute: 0 10*3/uL (ref 0.0–0.1)
Lymphocytes Relative: 8 % — ABNORMAL LOW (ref 12–46)
Monocytes Relative: 9 % (ref 3–12)
Neutrophils Relative %: 82 % — ABNORMAL HIGH (ref 43–77)
WBC Morphology: INCREASED

## 2011-02-22 LAB — FERRITIN: Ferritin: 515 ng/mL — ABNORMAL HIGH (ref 22–322)

## 2011-02-22 LAB — RETICULOCYTES
RBC.: 2.56 MIL/uL — ABNORMAL LOW (ref 4.22–5.81)
RBC.: 2.41 MIL/uL — ABNORMAL LOW (ref 4.22–5.81)
Retic Ct Pct: 3.5 % — ABNORMAL HIGH (ref 0.4–3.1)

## 2011-02-22 LAB — LACTATE DEHYDROGENASE: LDH: 312 U/L — ABNORMAL HIGH (ref 94–250)

## 2011-02-22 LAB — FOLATE: Folate: 6.7 ng/mL

## 2011-02-22 MED ORDER — DEXTROSE 5 % IV SOLN
500.0000 mg | INTRAVENOUS | Status: DC
  Filled 2011-02-22: qty 500

## 2011-02-22 MED ORDER — FUROSEMIDE 10 MG/ML IJ SOLN
20.0000 mg | Freq: Once | INTRAMUSCULAR | Status: AC
  Administered 2011-02-22: 20 mg via INTRAVENOUS
  Filled 2011-02-22: qty 2

## 2011-02-22 MED ORDER — INSULIN GLARGINE 100 UNIT/ML ~~LOC~~ SOLN
32.0000 [IU] | Freq: Every day | SUBCUTANEOUS | Status: DC
  Administered 2011-02-22: 32 [IU] via SUBCUTANEOUS

## 2011-02-22 MED ORDER — METHYLPREDNISOLONE SODIUM SUCC 125 MG IJ SOLR
60.0000 mg | INTRAMUSCULAR | Status: DC
  Administered 2011-02-22: 17:00:00 via INTRAVENOUS
  Administered 2011-02-23: 60 mg via INTRAVENOUS
  Filled 2011-02-22: qty 0.96
  Filled 2011-02-22: qty 2

## 2011-02-22 MED ORDER — FLUCONAZOLE 150 MG PO TABS
150.0000 mg | ORAL_TABLET | Freq: Once | ORAL | Status: AC
  Administered 2011-02-22: 150 mg via ORAL
  Filled 2011-02-22: qty 1

## 2011-02-22 MED ORDER — FUROSEMIDE 10 MG/ML IJ SOLN
20.0000 mg | Freq: Once | INTRAMUSCULAR | Status: DC
  Filled 2011-02-22: qty 2

## 2011-02-22 MED ORDER — DOXYCYCLINE HYCLATE 100 MG PO TABS
100.0000 mg | ORAL_TABLET | Freq: Two times a day (BID) | ORAL | Status: DC
  Administered 2011-02-22 – 2011-02-23 (×3): 100 mg via ORAL
  Filled 2011-02-22 (×6): qty 1

## 2011-02-22 MED ORDER — NYSTATIN 100000 UNIT/ML MT SUSP
10.0000 mL | Freq: Four times a day (QID) | OROMUCOSAL | Status: DC
  Administered 2011-02-22: 500000 [IU] via ORAL
  Administered 2011-02-22: 1000000 [IU] via ORAL
  Administered 2011-02-22: 500000 [IU] via ORAL
  Administered 2011-02-23 (×3): 1000000 [IU] via ORAL
  Filled 2011-02-22 (×9): qty 10

## 2011-02-22 NOTE — Progress Notes (Signed)
Subjective: Patient looks much better today, states he feels better and breathing much better. Objective: Vital signs in last 24 hours: Temp:  [98.3 F (36.8 C)-99.9 F (37.7 C)] 99.1 F (37.3 C) (12/16 1749) Pulse Rate:  [88-103] 95  (12/16 1749) Resp:  [18-31] 30  (12/16 1749) BP: (108-147)/(40-71) 139/65 mmHg (12/16 1749) SpO2:  [89 %-98 %] 90 % (12/16 1749) Weight:  [75.9 kg (167 lb 5.3 oz)] 167 lb 5.3 oz (75.9 kg) (12/16 0500) Weight change: 4.9 kg (10 lb 12.8 oz) Last BM Date: 02/18/11  Intake/Output from previous day: 12/15 0701 - 12/16 0700 In: 815 [I.V.:500; Blood:315] Out: 1600 [Urine:1600] Total I/O In: 980 [I.V.:480; Blood:500] Out: 600 [Urine:600]   Physical Exam: General: alert, sitting up in bed., oriented x3 HEENT: No bruits, no goiter. Heart: Regular rate and rhythm, without murmurs, rubs, gallops. Lungs: No rhonchi, or wheezes, scattered crackles at the bases. Abdomen: Soft, nontender, nondistended, positive bowel sounds. Extremities: No clubbing cyanosis +2 edema Neuro: Grossly intact, nonfocal.    Lab Results: Basic Metabolic Panel:  Basename 02/22/11 0421 02/21/11 1013  NA 125* 126*  K 4.0 4.1  CL 90* 90*  CO2 26 27  GLUCOSE 344* 304*  BUN 15 17  CREATININE 0.91 0.89  CALCIUM 8.1* 7.9*  MG -- --  PHOS -- --   Liver Function Tests: No results found for this basename: AST:2,ALT:2,ALKPHOS:2,BILITOT:2,PROT:2,ALBUMIN:2 in the last 72 hours CBC:  Basename 02/22/11 1320 02/22/11 1230 02/21/11 1013  WBC 11.7* -- 11.1*  NEUTROABS 9.6* -- --  HGB 7.3* 7.7* --  HCT 20.4* 21.7* --  MCV 84.6 -- 88.5  PLT 273 -- 211   CBG:  Basename 02/22/11 1222 02/22/11 0724 02/21/11 2135 02/21/11 2027 02/21/11 1655 02/21/11 1220  GLUCAP 376* 323* 394* 396* 389* 289*   Thyroid Function Tests: No results found for this basename: TSH,T4TOTAL,FREET4,T3FREE,THYROIDAB in the last 72 hours Coagulation: No results found for this basename: LABPROT:2,INR:2 in  the last 72 hours Urine Drug Screen: Drugs of Abuse     Component Value Date/Time   LABOPIA NEGATIVE 04/28/2009 0547   COCAINSCRNUR NEGATIVE 04/28/2009 0547   LABBENZ NEGATIVE 04/28/2009 0547   AMPHETMU NEGATIVE 04/28/2009 0547     Recent Results (from the past 240 hour(s))  CULTURE, SPUTUM-ASSESSMENT     Status: Normal   Collection Time   02/17/11  1:26 PM      Component Value Range Status Comment   Specimen Description SPUTUM   Final    Special Requests NONE   Final    Sputum evaluation     Final    Value: THIS SPECIMEN IS ACCEPTABLE. RESPIRATORY CULTURE REPORT TO FOLLOW.   Report Status 02/17/2011 FINAL   Final   CULTURE, RESPIRATORY     Status: Normal (Preliminary result)   Collection Time   02/17/11  1:26 PM      Component Value Range Status Comment   Specimen Description SPUTUM   Final    Special Requests NONE   Final    Gram Stain     Final    Value: ABUNDANT WBC PRESENT, PREDOMINANTLY PMN     NO SQUAMOUS EPITHELIAL CELLS SEEN     ABUNDANT GRAM POSITIVE COCCI     IN PAIRS   Culture ABUNDANT STREPTOCOCCUS PNEUMONIAE   Final    Report Status PENDING   Incomplete   MRSA PCR SCREENING     Status: Normal   Collection Time   02/17/11  5:20 PM  Component Value Range Status Comment   MRSA by PCR NEGATIVE  NEGATIVE  Final     Studies/Results: Ct Abdomen Pelvis Wo Contrast  02/21/2011  *RADIOLOGY REPORT*  Clinical Data: Evaluate for retroperitoneal hematoma  CT ABDOMEN AND PELVIS WITHOUT CONTRAST  Technique:  Multidetector CT imaging of the abdomen and pelvis was performed following the standard protocol without intravenous contrast.  Comparison: none  Findings:  Small bilateral pleural effusions identified.  There is airspace consolidation within both lower lobes.  Gallbladder appears collapsed.  The biliary tree is negative.  No focal liver abnormality identified. The pancreas is unremarkable.  The spleen negative.  The adrenal glands are negative.  Normal appearance of the  right kidney.  The left kidney is normal.  No upper abdominal adenopathy.  There is no pelvic or inguinal adenopathy noted.  Urinary bladder appears within normal limits.  Within the right posterior bladder base there is a focal filling defect which measures 2.4 cm and is intermediate density.  This is indeterminate.  Focal bladder neoplasm or blood clot cannot be excluded.  The prostate gland appears normal.  There is no free fluid or fluid collections within the abdomen or pelvis.  The stomach and the small bowel loops have a normal course and caliber.  There is no evidence for hematoma within the abdomen with the pelvis.  Review of the visualized osseous structures is significant for degenerative disc disease within the lumbar spine.  IMPRESSION:  1.  There is no evidence for hematoma within the abdomen or pelvis. 2.  Indeterminant filling defect within the bladder base.  Cannot rule out bladder neoplasm or blood clot. 3.  Bilateral pleural effusions with bilateral lung base infiltrates.  Original Report Authenticated By: Rosealee Albee, M.D.   Pro brain natruretic peptide -595.9 Medications: Scheduled Meds:    . azithromycin  500 mg Intravenous Q24H  . diltiazem  240 mg Oral Daily  . fluconazole  150 mg Oral Once  . Fluticasone-Salmeterol  1 puff Inhalation Q12H  . furosemide  20 mg Intravenous Once  . furosemide  20 mg Intravenous Once  . furosemide  20 mg Intravenous Once  . insulin aspart  0-15 Units Subcutaneous TID WC  . insulin aspart  0-5 Units Subcutaneous QHS  . insulin glargine  32 Units Subcutaneous QHS  . levalbuterol  2 puff Inhalation Q6H  . methylPREDNISolone (SOLU-MEDROL) injection  60 mg Intravenous Q24H  . nystatin  10 mL Oral QID  . DISCONTD: aspirin  325 mg Oral Daily  . DISCONTD: enoxaparin (LOVENOX) injection  40 mg Subcutaneous Q24H  . DISCONTD: insulin aspart  0-9 Units Subcutaneous TID WC  . DISCONTD: insulin glargine  24 Units Subcutaneous QHS  . DISCONTD:  moxifloxacin  400 mg Oral q1800   Continuous Infusions:   PRN Meds:.acetaminophen, acetaminophen, chlorpheniramine-HYDROcodone, nitroGLYCERIN, ondansetron (ZOFRAN) IV, ondansetron, phenol  Assessment/Plan:  Active Problems:  Atrial flutter/fib  URTI (acute upper respiratory infection)  DM (diabetes mellitus)  Community aquired pneumonia  COPD (chronic obstructive pulmonary disease)  Hypoxemia  Anemia  #1Anemia -unexplained rapid drop in hemoglobin and stool guaiac negative, CT scan of abdomen and pelvis with no evidence of retroperitoneal bleed. Hematology consulted and assisting with further recommendations - main consideration non-immune hemolysis and patient is empirically started on steroids. Regions antibiotics also changed to azithromycin to cover for possible mycoplasma pneumonia. Patient  transfused and packed red blood cell hemoglobin improving, also further transfused today, follow.  #2 atrial flutter: Per cardiology.  #3 pneumonia,  multilobar- as above antibiotics are changed to Zithromax to cover for him possible mycoplasma pneumonia.  #COPD/hypoxia - continue bronchodilators, Advair, antibiotics as above and supplemental. Patient seen by pulmonology and outpatient workup for ? Interstitial lung disease/fibrosis recommended. A brain natruretic peptide of only mildly elevated at 595.9, he received Lasix  when necessary basis. #4 Uncontrolled DM -monitor on increased of dose of Lantus and on steroids and further treat accordingly.  #5 hypokalemia-resolved. #6 hyponatremia - likely component  Of SIADH secondary to #2, fluid restrict and follow.   LOS: 7 days   Kela Millin [facility] Pager: [phone] 02/22/2011, 6:19 PM   A

## 2011-02-22 NOTE — Progress Notes (Signed)
Pt TX to 2007.

## 2011-02-22 NOTE — Consult Note (Addendum)
Referring MD: Sherryl Manges  PCP:  Rudi Heap  Reason for Referral: Unexplained, dramatic fall in hemoglobin  Chief Complaint  Patient presents with  . Emesis   The patient is a 55 year old man with insulin-dependent diabetes since age 63. He has no other major medical or surgical illness. He developed a hacking cough about one week ago. He was coughing so hard that he started to vomit. He did not take his temperature but did not think he had a fever. No hemoptysis. He thought he probably had the flu. He denied any myalgias or arthralgias. No exposures. He presented to the emergency department with these complaints. A chest radiograph showed changes of chronic bronchitis and some pulmonary fibrotic changes but no gross infiltrate or effusion. Initial blood count recorded on 12/9 with hemoglobin 14.6 hematocrit 44 white count 8800 no differential recorded. On 12/10 hemoglobin 13.5. On 12/12,  11.5.  Count recorded  12/15 showed  a dramatic fall in the hemoglobin to 5.5 with hematocrit 15.4 white count 11,100 platelets 211,000. He is not on any anticoagulants other than prophylactic doses given during the hospitalization.  A CT scan of the abdomen and pelvis was done on 12/15  Which did not show any evidence for intra-abdominal pelvic or retroperitoneal hemorrhage. Liver chemistries done 12/10 were normal with a bilirubin 0.6 SGOT 20 SGPT of 11. I do not see any subsequent values recorded.  Medications are as listed. He has not been put on any oxidizing  drugs or other medications known to be associated with hemolysis. Antibiotic started on admission was Avelox which he continues up until today. Diflucan was added on 12/16.  He is Caucasian. There is no family history of any bleeding disorder and his parents, brother, or 2 sisters. He is a heavy smoker 3 packs per day. I did not ask him about alcohol use.     Past Medical History  Diagnosis Date  . Diabetes mellitus   . Tuberculosis    :  History reviewed. No pertinent past surgical history.:     . azithromycin  500 mg Intravenous Q24H  . diltiazem  240 mg Oral Daily  . fluconazole  150 mg Oral Once  . Fluticasone-Salmeterol  1 puff Inhalation Q12H  . furosemide  20 mg Intravenous Once  . furosemide  20 mg Intravenous Once  . furosemide  20 mg Intravenous Once  . insulin aspart  0-15 Units Subcutaneous TID WC  . insulin aspart  0-5 Units Subcutaneous QHS  . insulin glargine  32 Units Subcutaneous QHS  . levalbuterol  2 puff Inhalation Q6H  . methylPREDNISolone (SOLU-MEDROL) injection  60 mg Intravenous Q24H  . nystatin  10 mL Oral QID  . DISCONTD: aspirin  325 mg Oral Daily  . DISCONTD: enoxaparin (LOVENOX) injection  40 mg Subcutaneous Q24H  . DISCONTD: insulin aspart  0-9 Units Subcutaneous TID WC  . DISCONTD: insulin glargine  24 Units Subcutaneous QHS  . DISCONTD: moxifloxacin  400 mg Oral q1800  :  No Known Allergies:  No family history on file.:  History   Social History  . Marital Status: Married    Spouse Name: N/A    Number of Children: N/A  . Years of Education: N/A   Occupational History  . Not on file.   Social History Main Topics  . Smoking status: Current Everyday Smoker -- 2.0 packs/day    Types: Cigarettes  . Smokeless tobacco: Not on file  . Alcohol Use: No  . Drug Use: No  .  Sexually Active:    Other Topics Concern  . Not on file   Social History Narrative  . No narrative on file  :  ROS: Eyes: No change in vision Throat: Sore throat from coughing for one week Neck: No stiff neck Resp: No dyspnea.   Cardio: No ischemic chest pain or pressure no palpitations. GI: Loose stool since he has been in the hospital but no significant diarrhea prior to admission. No abdominal pain. No hematochezia or melena. Extremities: No edema. Lymph nodes: No swollen glands. Neurologic: Not questioned.   Skin: . No rash. Genitourinary: No dysuria frequency or hematuria.  No  arthralgias or myalgias.   Vitals: Filed Vitals:   02/22/11 1650  BP:   Pulse:   Temp: 99.1 F (37.3 C)  Resp: 22    PHYSICAL EXAM: General appearance: Ejaculator Caucasian man Head: Pupils equal reactive to light Eyes: Normal Throat: No erythema or exudate Neck: No thyromegaly or thyroid mass Resp: Coarse rhonchi and decreased breath sounds one third up over the right hemithorax Breasts: Not examined Cardio: Regular cardiac rhythm no gallop murmur or rub GI: Soft nontender no mass no organomegaly Extremities: 1+ ankle edema Vascular: Well perfused with the dorsalis pedis pulses 2+ symmetric Lymph nodes: No cervical supraclavicular or axillary adenopathy Neurologic: Cranial nerves intact motor strength 5 over 5 reflexes 1+ symmetric Skin: No jaundice no rash  Labs:   Basename 02/22/11 1320 02/22/11 1230 02/21/11 1013  WBC 11.7* -- 11.1*  HGB 7.3* 7.7* --  HCT 20.4* 21.7* --  PLT 273 -- 211    Basename 02/22/11 0421 02/21/11 1013  NA 125* 126*  K 4.0 4.1  CL 90* 90*  CO2 26 27  GLUCOSE 344* 304*  BUN 15 17  CREATININE 0.91 0.89  CALCIUM 8.1* 7.9*    Blood smear review: Red cells overall normochromic but there is a subpopulation of microcytes/spherocytes. I saw only one schistocyte.. There is polychromasia. There is prominent rouleaux. Neutrophils and lymphocytes appear mature. No inclusion bodies.  Images Studies/Results:  Ct Abdomen Pelvis Wo Contrast  02/21/2011  *RADIOLOGY REPORT*  Clinical Data: Evaluate for retroperitoneal hematoma  CT ABDOMEN AND PELVIS WITHOUT CONTRAST  Technique:  Multidetector CT imaging of the abdomen and pelvis was performed following the standard protocol without intravenous contrast.  Comparison: none  Findings:  Small bilateral pleural effusions identified.  There is airspace consolidation within both lower lobes.  Gallbladder appears collapsed.  The biliary tree is negative.  No focal liver abnormality identified. The pancreas is  unremarkable.  The spleen negative.  The adrenal glands are negative.  Normal appearance of the right kidney.  The left kidney is normal.  No upper abdominal adenopathy.  There is no pelvic or inguinal adenopathy noted.  Urinary bladder appears within normal limits.  Within the right posterior bladder base there is a focal filling defect which measures 2.4 cm and is intermediate density.  This is indeterminate.  Focal bladder neoplasm or blood clot cannot be excluded.  The prostate gland appears normal.  There is no free fluid or fluid collections within the abdomen or pelvis.  The stomach and the small bowel loops have a normal course and caliber.  There is no evidence for hematoma within the abdomen with the pelvis.  Review of the visualized osseous structures is significant for degenerative disc disease within the lumbar spine.  IMPRESSION:  1.  There is no evidence for hematoma within the abdomen or pelvis. 2.  Indeterminant filling defect within the  bladder base.  Cannot rule out bladder neoplasm or blood clot. 3.  Bilateral pleural effusions with bilateral lung base infiltrates.  Original Report Authenticated By: Rosealee Albee, M.D.     Pathology:   Assessment and Plan:  Active Problems:  Atrial flutter/fib  URTI (acute upper respiratory infection)  DM (diabetes mellitus)  Community aquired pneumonia  COPD (chronic obstructive pulmonary disease)  Hypoxemia  Anemia   Impression: #1. Rapid dramatic fall in hemoglobin with no obvious signs of active bleeding. Presence of rouleaux on peripheral blood film with reticulocyte percentage increased 3.5% and serum LDH elevated at 312. Repeat bilirubin and ancillary studies pending. The direct Coombs' test is negative.  Main consideration is a nonimmune hemolysis or what I feel is more likely, IgM antibodies, cold agglutinins, causing hemolysis. This would be most common with a mycoplasma infection but also seen with Epstein-Barr virus infection.  I would also consider an atypical drug reaction to Avelox.  Recommendations: I'm going to stop the Avelox. Begin azithromycin which would be the drug of choice for a mycoplasma pneumonia. Check mycoplasma and  EBV titers. Check cold agglutinin titer. I'm having the blood bank and do a I i panel to look for cold reacting antibodies. Some of these are only weakly reacting and don't give a positive results on the regular Coombs' test. I agree with transfusion support as needed. Blood bank has not had any trouble cross matching his blood. I specifically asked them to look for any agglutination and they did not see any. However I don't think it would hurt to use a blood warmer until we have more information. Although steroids tend not to work as well with  IgM antibody problems, I have still seen responses in the past and I think it is worth putting him on a trial of steroids  pending further data  Thank you for this consultation    Keziyah Kneale M 02/22/2011, 5:24 PM

## 2011-02-22 NOTE — Progress Notes (Signed)
Patient ID: GLOYD HAPP, male   DOB: 28-Aug-1955, 55 y.o.   MRN: [id number] [facility] Gastroenterology Progress Note  LIVAN HIRES 55 y.o. [birth year]   Subjective: No abdominal pain. Feels better since bath and shaved.    Objective: Vital signs: Filed Vitals:   02/22/11 0944  BP: 130/58  Pulse: 90  Temp: 98.3  Resp: 20    Intake/Output last 24 hrs:  Intake/Output Summary (Last 24 hours) at 02/22/11 1406 Last data filed at 02/22/11 0900  Gross per 24 hour  Intake    815 ml  Output   1250 ml  Net   -435 ml    Physical Exam: Gen: alert, well-nourished, no acute distress  Abd: soft, non-tender; bowel sounds normal; no masses,  no organomegaly Ext: 1-2+ LE pitting edema  Lab Results:  Basename 02/22/11 0421 02/21/11 1013  NA 125* 126*  K 4.0 4.1  CL 90* 90*  CO2 26 27  GLUCOSE 344* 304*  BUN 15 17  CREATININE 0.91 0.89  CALCIUM 8.1* 7.9*  MG -- --  PHOS -- --   No results found for this basename: AST:2,ALT:2,ALKPHOS:2,BILITOT:2,PROT:2,ALBUMIN:2 in the last 72 hours  Basename 02/22/11 1320 02/22/11 1230 02/21/11 1013  WBC 11.7* -- 11.1*  NEUTROABS PENDING -- --  HGB 7.3* 7.7* --  HCT 20.4* 21.7* --  MCV 84.6 -- 88.5  PLT 273 -- 211     Medications: I have reviewed the patient's current medications.  Assessment/Plan: 55yo with significant drop in Hgb without any obvious source seen. Heme negative and CT unrevealing. Never had a colonoscopy and needs a colonoscopy +/- EGD at some point but no indication that it needs to be done right now.  I do NOT think his GI tract is the source of this anemia.  Agree with Hematology evaluation as next step. Will follow prn. Dr. Matthias Hughs available to see this week if needed but will sign off and call us back if inpt GI procedures desired or questions arise.   Landers Prajapati C. 02/22/2011, 2:06 PM

## 2011-02-22 NOTE — Progress Notes (Signed)
Received call this evening from nurse regarding patient reporting being allergic to azithromycin as previously ordered this afternoon by heme/onc after consultation for dramatically decreased Hgb. (The patient initially denied any known allergies until the medicine was going to be given.) The nurse initially spoke with the on-call physician with heme/onc, who deferred changing the antibiotic to [place] since we are primary. It may be beneficial to consider transfer to the medical service as primary team since the patient's atrial fib seems to be secondary to evolving issues at this point. I spoke with inpatient pharmacy who recommended changing to doxycycline for presumed mycoplasma pneumoniae at 100mg  bid, so azithromycin was discontinued.  Kriste Basque Staceyann Knouff PA-C 02/22/2011 6:44 PM

## 2011-02-22 NOTE — Progress Notes (Addendum)
SUBJECTIVE: Wants to leave AMA, tired of being in the hospital.Mouth hurts.   Active Problems:  Atrial flutter/fib  URTI (acute upper respiratory infection)  DM (diabetes mellitus)  Community aquired pneumonia  COPD (chronic obstructive pulmonary disease)  Hypoxemia  Anemia   LABS: Basic Metabolic Panel:  Basename 02/22/11 0421 02/21/11 1013  NA 125* 126*  K 4.0 4.1  CL 90* 90*  CO2 26 27  GLUCOSE 344* 304*  BUN 15 17  CREATININE 0.91 0.89  CALCIUM 8.1* 7.9*  MG -- --  PHOS -- --   Liver Function Tests: CBC:  Basename 02/22/11 0421 02/22/11 0005 02/21/11 1013  WBC -- -- 11.1*  NEUTROABS -- -- --  HGB 7.5* 7.1* --  HCT 21.3* 19.9* --  MCV -- -- 88.5  PLT -- -- 211    RADIOLOGY: Ct Abdomen Pelvis Wo Contrast  02/21/2011  *RADIOLOGY REPORT*  Clinical Data: Evaluate for retroperitoneal hematoma  CT ABDOMEN AND PELVIS WITHOUT CONTRAST  IMPRESSION:  1.  There is no evidence for hematoma within the abdomen or pelvis. 2.  Indeterminant filling defect within the bladder base.  Cannot rule out bladder neoplasm or blood clot. 3.  Bilateral pleural effusions with bilateral lung base infiltrates.  Original Report Authenticated By: Rosealee Albee, M.D.   Dg Chest 2 View  02/20/2011  *RADIOLOGY REPORT*  Clinical Data: Shortness of breath.  Weakness.  CHEST - 2 VIEW  Comparison: 02/17/2011.  02/18/2011.  Marland Kitchen  IMPRESSION: Prominence of main pulmonary artery segment.  Overall hyperinflation configuration.  Mild upper lobe vascular prominence. Normal sized heart.  Increased reticular interstitial markings. Patchy infiltrative densities without consolidation in the upper lobes and in the lung bases.  Blunting of both posterior costophrenic angles consistent with small amounts of pleural effusion.  There is associated atelectasis and infiltrative density.  Central peribronchial thickening. This may be associated with bronchitis, asthma, and reactive airway disease.  Original Report  Authenticated By: Crawford Givens, M.D.     PHYSICAL EXAM BP 130/58  Pulse 92  Temp(Src) 98.3 F (36.8 C) (Tympanic)  Resp 18  Ht 5\' 10"  (1.778 m)  Wt 167 lb 5.3 oz (75.9 kg)  BMI 24.01 kg/m2  SpO2 96% General: Well developed, well nourished, in no acute distress. Tongue with redness and soreness. Some yeast noted on tongue. Head: Eyes PERRLA, No xanthomas.   Normal cephalic and atramatic  Lungs: Bilateral crackles. No wheezes. Heart: Irregular rhythm. 1/6 systolic murmur. .  Pulses are 2+ & equal.            No carotid bruit. No JVD.  No abdominal bruits. No femoral bruits. Abdomen: Bowel sounds are positive, abdomen soft and non-tender without masses or                  Hernia's noted. Msk:  Back normal, normal gait. Normal strength and tone for age. Extremities: No clubbing, cyanosis or  2+ edema in dependent position..  DP +1 Neuro: Alert and oriented X 3. Psych:  Irritated and expresses frustration.  TELEMETRY: Reviewed telemetry pt in: Atrial fib.  ASSESSMENT AND PLAN:  1. Atrial fibrillation: Heart rate is essentially controlled on current medication regimen of diltiazem CD 240 mg. The patient is not on any anticoagulation at this time [place] score is 1 and he remains anemic. We'll move the patient to telemetry for patient comfort and able to angulate further as he is beginning to be very frustrated staying in the same room and not able to move  around. He is reluctant to stay in the hospital however I have convinced him to do so since we are changing rooms allowing him to be more ambulatory. I have allowed him to take a shower and shave as well. Monitor HR with increased ambulation.  2. Anemia: Patient's hemoglobin is 7.1 this a.m. dropped down from 7.5 yesterday. There is some lower extremity edema. I do not see where he has had an anemia profile completed this will be ordered. He will be typed crossed and given 2 units of packed red blood cells. Will give Lasix in between doses.  With a followup CBC in the a.m. Would continue to Hemoccult stools, which are so far negative.. Consider GI evaluation for EGD if necessary.  3. Diabetes: Blood glucose remains elevated on current insulin dosage. He normally takes 32 units of insulin daily. We'll increase to his home dose. And monitor closely. BMET  will be ordered in the morning.  4.Oral Candidiasis: Examination of mouth and oral cavity as patient was complaining of sore throat revealed swollen red tongue with white patches. This appears to be oral candidiasis. Will start nystatin swish and swallow for comfort. We'll also provide one dose of Diflucan 150 mg by mouth. This may be related to his diabetes and Avelox dosing in the setting of pneumonia.  Thomas Sandoval [facility] 02/22/2011, 9:36 AM

## 2011-02-22 NOTE — Progress Notes (Signed)
Pt wants to leave AMA. [facility] cardiology on call provider  and [facility] Dr. Donna Bernard. Discussed medical rationale with Pt; heart arrythmia, PNA, low H&H post blood TX. Waiting for Dr to arrive to convince Pt to remain or sign paperwork for [facility]

## 2011-02-22 NOTE — Progress Notes (Signed)
This is in addition to the note of Lorin Picket  It is not AT ALL CLEAR TO ME WHAT IS GOING ON WTH THE ANEMIA   I find it curious that there is 9gm loss and nothing to show for it;  We will call hematollgoy

## 2011-02-23 ENCOUNTER — Encounter (HOSPITAL_COMMUNITY): Payer: Self-pay | Admitting: Oncology

## 2011-02-23 DIAGNOSIS — IMO0002 Reserved for concepts with insufficient information to code with codable children: Secondary | ICD-10-CM

## 2011-02-23 DIAGNOSIS — R0902 Hypoxemia: Secondary | ICD-10-CM

## 2011-02-23 DIAGNOSIS — J13 Pneumonia due to Streptococcus pneumoniae: Secondary | ICD-10-CM

## 2011-02-23 DIAGNOSIS — J449 Chronic obstructive pulmonary disease, unspecified: Secondary | ICD-10-CM

## 2011-02-23 LAB — HEMOGLOBIN AND HEMATOCRIT, BLOOD
HCT: 29.8 % — ABNORMAL LOW (ref 39.0–52.0)
Hemoglobin: 10.3 g/dL — ABNORMAL LOW (ref 13.0–17.0)

## 2011-02-23 LAB — TYPE AND SCREEN
ABO/RH(D): A POS
Antibody Identification: NEGATIVE
Antibody Screen: NEGATIVE
Unit division: 0
Unit division: 0
Unit division: 0

## 2011-02-23 LAB — COMPREHENSIVE METABOLIC PANEL
Albumin: 2.3 g/dL — ABNORMAL LOW (ref 3.5–5.2)
Alkaline Phosphatase: 99 U/L (ref 39–117)
BUN: 17 mg/dL (ref 6–23)
Calcium: 8.3 mg/dL — ABNORMAL LOW (ref 8.4–10.5)
GFR calc Af Amer: >90 mL/min (ref >90)
Glucose, Bld: 412 mg/dL — ABNORMAL HIGH (ref 70–99)
Potassium: 5.1 mEq/L (ref 3.5–5.1)
Sodium: 124 mEq/L — ABNORMAL LOW (ref 135–145)
Total Protein: 6 g/dL (ref 6.0–8.3)

## 2011-02-23 LAB — OCCULT BLOOD X 1 CARD TO LAB, STOOL: Fecal Occult Bld: POSITIVE

## 2011-02-23 LAB — GLUCOSE, CAPILLARY
Glucose-Capillary: 135 mg/dL — ABNORMAL HIGH (ref 70–99)
Glucose-Capillary: 143 mg/dL — ABNORMAL HIGH (ref 70–99)
Glucose-Capillary: 143 mg/dL — ABNORMAL HIGH (ref 70–99)
Glucose-Capillary: 544 mg/dL — ABNORMAL HIGH (ref 70–99)
Glucose-Capillary: 569 mg/dL (ref 70–99)
Glucose-Capillary: >600 mg/dL (ref 70–99)
Glucose-Capillary: >600 mg/dL (ref 70–99)

## 2011-02-23 LAB — CBC
MCH: 30 pg (ref 26.0–34.0)
MCV: 85 fL (ref 78.0–100.0)
RBC: 3.27 MIL/uL — ABNORMAL LOW (ref 4.22–5.81)

## 2011-02-23 LAB — DIFFERENTIAL
Basophils Absolute: 0 10*3/uL (ref 0.0–0.1)
Basophils Relative: 0 % (ref 0–1)
Lymphocytes Relative: 3 % — ABNORMAL LOW (ref 12–46)
Monocytes Relative: 4 % (ref 3–12)
Neutro Abs: 10.1 10*3/uL — ABNORMAL HIGH (ref 1.7–7.7)
Neutrophils Relative %: 93 % — ABNORMAL HIGH (ref 43–77)

## 2011-02-23 LAB — BASIC METABOLIC PANEL
CO2: 17 mEq/L — ABNORMAL LOW (ref 19–32)
Chloride: 83 mEq/L — ABNORMAL LOW (ref 96–112)
Creatinine, Ser: 0.84 mg/dL (ref 0.50–1.35)
Potassium: 5.6 mEq/L — ABNORMAL HIGH (ref 3.5–5.1)

## 2011-02-23 LAB — LACTATE DEHYDROGENASE: LDH: 302 U/L — ABNORMAL HIGH (ref 94–250)

## 2011-02-23 LAB — RETICULOCYTES
RBC.: 3.27 MIL/uL — ABNORMAL LOW (ref 4.22–5.81)
Retic Ct Pct: 2.7 % (ref 0.4–3.1)

## 2011-02-23 LAB — EBV AB TO VIRAL CAPSID AG PNL, IGG+IGM: EBV VCA IgG: 6.72 {ISR} — ABNORMAL HIGH

## 2011-02-23 MED ORDER — AMOXICILLIN-POT CLAVULANATE 875-125 MG PO TABS
1.0000 | ORAL_TABLET | Freq: Two times a day (BID) | ORAL | Status: DC
  Administered 2011-02-23 (×2): 1 via ORAL
  Filled 2011-02-23 (×4): qty 1

## 2011-02-23 MED ORDER — SODIUM CHLORIDE 0.9 % IV SOLN
INTRAVENOUS | Status: DC
  Administered 2011-02-23: 12:00:00 via INTRAVENOUS
  Administered 2011-02-23: 16.5 [IU]/h via INTRAVENOUS
  Filled 2011-02-23 (×3): qty 1

## 2011-02-23 MED ORDER — INSULIN GLARGINE 100 UNIT/ML ~~LOC~~ SOLN: 38.0000 [IU] | Freq: Every day | SUBCUTANEOUS | Status: DC

## 2011-02-23 MED ORDER — INSULIN ASPART 100 UNIT/ML ~~LOC~~ SOLN
0.0000 [IU] | Freq: Three times a day (TID) | SUBCUTANEOUS | Status: DC
  Filled 2011-02-23: qty 3

## 2011-02-23 MED ORDER — INSULIN REGULAR BOLUS VIA INFUSION
0.0000 [IU] | Freq: Three times a day (TID) | INTRAVENOUS | Status: DC
  Administered 2011-02-23: 3.2 [IU] via INTRAVENOUS
  Administered 2011-02-23: 5.1 [IU] via INTRAVENOUS
  Filled 2011-02-23 (×3): qty 10

## 2011-02-23 MED ORDER — DEXTROSE 50 % IV SOLN: 25.0000 mL | INTRAVENOUS | Status: DC | PRN

## 2011-02-23 MED ORDER — METOPROLOL TARTRATE 1 MG/ML IV SOLN
INTRAVENOUS | Status: AC
  Administered 2011-02-23: 5 mg via INTRAVENOUS
  Filled 2011-02-23: qty 5

## 2011-02-23 MED ORDER — LEVALBUTEROL TARTRATE 45 MCG/ACT IN AERO
2.0000 | INHALATION_SPRAY | Freq: Two times a day (BID) | RESPIRATORY_TRACT | Status: DC
  Administered 2011-02-23: 2 via RESPIRATORY_TRACT

## 2011-02-23 MED ORDER — FUROSEMIDE 40 MG PO TABS
40.0000 mg | ORAL_TABLET | Freq: Every day | ORAL | Status: DC
  Administered 2011-02-23: 40 mg via ORAL
  Filled 2011-02-23 (×2): qty 1

## 2011-02-23 MED ORDER — INSULIN GLARGINE 100 UNIT/ML ~~LOC~~ SOLN: 37.0000 [IU] | Freq: Every day | SUBCUTANEOUS | Status: DC

## 2011-02-23 MED ORDER — METOPROLOL TARTRATE 1 MG/ML IV SOLN
5.0000 mg | INTRAVENOUS | Status: DC | PRN
  Administered 2011-02-23: 5 mg via INTRAVENOUS

## 2011-02-23 MED ORDER — SODIUM CHLORIDE 0.9 % IV SOLN
INTRAVENOUS | Status: DC
  Administered 2011-02-23: 12:00:00 via INTRAVENOUS

## 2011-02-23 NOTE — Progress Notes (Signed)
Name: Thomas Sandoval MRN: [id number] DOB: 1956/02/01  LOS: 8  PULMONARY FU NOTE  History of Present Illness: 55 y/o male with a long smoking history (2-3 ppd for >30 year) presented to [facility] on 02/16/11 with a viral prodrome (nausea, vomiting, cough, slightly dyspnea) and no fever but some chills.  He was found to be in Afib with RVR and was admitted to cardiology.  He has been on dilt for rate controlled and diuresed during the admission.  Because of a CHADS2 score of 1 he has been given ASA.  He had progressive cough and dyspnea with sputum production.  No fever or elevated WBC.  Started on levaquin for sputum - pneumococcus Course complicated by hemolysis requiring steroids & steroid induced hyperglycemia.     Cultures / Sepsis markers: 02/17/11 Sputum: strep pneumo, sensitivities pending  Antibiotics: 12/11 Levaquin>>stopped augmentin doxy  He is worried about high sugars, dyspnea improved, no chest pain, palpitations Vital Signs:   Filed Vitals:   02/22/11 2339 02/23/11 0015 02/23/11 0529 02/23/11 0918  BP: 137/71 134/77 124/63   Pulse: 87 92 89   Temp: 97.4 F (36.3 C) 97.2 F (36.2 C) 98.3 F (36.8 C)   TempSrc: Oral Oral Oral   Resp: 20 20 20    Height:      Weight:      SpO2:   92% 90%     Physical Examination: Gen:  no acute distress, no accessory muscle use HEENT: NCAT, PERRL, EOMi, OP clear,  Neck: supple without masses PULM: no rhonchi, frequent cough,bibasal crackles, good air movement CV: Irreg, irreg, no mgr, no JVD AB: BS+, soft, nontender, no hsm Ext: warm, no edema, no clubbing, no cyanosis Derm: no rash or skin breakdown Neuro: A&Ox4, CN II-XII intact, strength 5/5 in all 4 extremities Psyche: Normal mood and affect  Labs and Imaging:   02/20/11 CXR: hyperinflation, patchy airspace disease and interstitial opacities  CBC    Component Value Date/Time   WBC 10.8* 02/23/2011 0600   RBC 3.27* 02/23/2011 0600   HGB 9.8* 02/23/2011 0600   HCT  27.8* 02/23/2011 0600   PLT 312 02/23/2011 0600   MCV 85.0 02/23/2011 0600   MCH 30.0 02/23/2011 0600   MCHC 35.3 02/23/2011 0600   RDW 14.8 02/23/2011 0600   LYMPHSABS 0.3* 02/23/2011 0600   MONOABS 0.4 02/23/2011 0600   EOSABS 0.0 02/23/2011 0600   BASOSABS 0.0 02/23/2011 0600    BMET    Component Value Date/Time   NA 124* 02/23/2011 0600   K 5.1 02/23/2011 0600   CL 87* 02/23/2011 0600   CO2 21 02/23/2011 0600   GLUCOSE 412* 02/23/2011 0600   BUN 17 02/23/2011 0600   CREATININE 0.87 02/23/2011 0600   CALCIUM 8.3* 02/23/2011 0600   GFRNONAA >90 02/23/2011 0600   GFRAA >90 02/23/2011 0600    .  Assessment and Plan:  55 y/o male with COPD and hypoxemia admitted with a-fib with rvr in the setting of a viral prodrome and community acquired pneumonia with sputum culture positive for s. Pneumonia.  Hypoxemia resolved.  Atrial flutter (02/16/2011)   Assessment: intermittent rvr   Plan: Per cardiology  DM (diabetes mellitus) (02/16/2011)   Assessment: worsened by steroids   Plan: -SSI, defer to heme to drop steroids rapidly  Community aquired pneumonia (02/20/2011)   Assessment: multilobar, due to strep pneumonia. not in respiratory distress.     Plan:  Augmentin + doxy -pulm toilette -prn nebs  COPD (chronic obstructive pulmonary disease) (02/20/2011)  Assessment: not in exacerbation right now, due to tobacco abuse   Plan: Avoid albuterol nebs due to tachyrrhythmias -home advair  Hypoxemia (02/20/2011)   Assessment: due to COPD and pneumonia.  Possibly volume up.  We were asked to comment on the possibility of interstitial lung disease/fibrosis.  It is really difficult to say in the acute setting with a prior viral prodrome and now an obvious pulmonary infection.  CT suggests mild bronchiectasis   Plan: out pt FU with imagine & PFTs  Tobacco abuse: -advised to quit, discussed risks, I do not think that he is ready to quit as he believes that cigarettes "aren't all  that bad"  PCCM  To sign off - out pt appts have been made  [facility] V.   02/23/2011, 10:44 AM

## 2011-02-23 NOTE — Progress Notes (Signed)
Patient Name: Thomas Sandoval      SUBJECTIVE feels much better this am but is very concerned about steroids and his diabetes  Past Medical History  Diagnosis Date  . Diabetes mellitus   . Tuberculosis   . Hemolysis 02/23/2011    PHYSICAL EXAM Filed Vitals:   02/22/11 2242 02/22/11 2339 02/23/11 0015 02/23/11 0529  BP: 139/76 137/71 134/77 124/63  Pulse: 87 87 92 89  Temp: 98.3 F (36.8 C) 97.4 F (36.3 C) 97.2 F (36.2 C) 98.3 F (36.8 C)  TempSrc: Oral Oral Oral Oral  Resp: 22 20 20 20   Height:      Weight:      SpO2:    92%    General appearance: alert, cooperative and no distress Neck: no JVD Lungs: rhonchi posterior - bilateral Heart: regular rate and rhythm, S1, S2 normal and systolic murmur: midsystolic 2/6, medium pitch at 2nd left intercostal space Abdomen: soft, non-tender; bowel sounds normal; no masses,  no organomegaly Extremities: edema trace Skin: Skin color, texture, turgor normal. No rashes or lesions Neurologic: Alert and oriented X 3, normal strength and tone. Normal symmetric reflexes. Normal coordination and gait  TELEMETRY: Reviewed telemetry pt in NSR  :    Intake/Output Summary (Last 24 hours) at 02/23/11 0816 Last data filed at 02/23/11 0700  Gross per 24 hour  Intake   2075 ml  Output   1050 ml  Net   1025 ml    LABS: Basic Metabolic Panel:  Lab 02/23/11 2130 02/22/11 0421 02/21/11 1013 02/20/11 0450 02/19/11 1310 02/19/11 0410 02/18/11 0420 02/16/11 0916  NA 124* 125* 126* 129* 128* 125* 129* --  K 5.1 4.0 4.1 3.6 3.7 3.6 3.1* --  CL 87* 90* 90* 96 98 98 98 --  CO2 21 26 27 26 22 21 21  --  GLUCOSE 412* 344* 304* 89 163* 147* 155* --  BUN 17 15 17 12 14 14 16  --  CREATININE 0.87 0.91 0.89 0.80 0.82 0.74 0.70 --  CALCIUM 8.3* 8.1* -- -- -- -- -- --  MG -- -- -- -- -- -- -- 1.6  PHOS -- -- -- -- -- -- -- --   Cardiac Enzymes: No results found for this basename: CKTOTAL:3,CKMB:3,CKMBINDEX:3,TROPONINI:3 in the last 72  hours CBC:  Lab 02/23/11 0600 02/22/11 2000 02/22/11 1320 02/22/11 1230 02/22/11 0421 02/22/11 0005 02/21/11 1013 02/18/11 0420 02/17/11 1553 02/17/11 0535 02/16/11 0916  WBC 10.8* -- 11.7* -- -- -- 11.1* 9.7 11.8* 14.0* 13.5*  NEUTROABS 10.1* -- 9.6* -- -- -- -- 8.3* -- -- --  HGB 9.8* 8.8* 7.3* 7.7* 7.5* 7.1* 5.5* -- -- -- --  HCT 27.8* 24.7* 20.4* 21.7* 21.3* 19.9* 15.4* -- -- -- --  MCV 85.0 -- 84.6 -- -- -- 88.5 89.4 90.5 91.7 93.0  PLT 312 -- 273 -- -- -- 211 165 189 201 190   PROTIME: No results found for this basename: LABPROT:3,INR:3 in the last 72 hours Liver Function Tests:  Basename 02/23/11 0600  AST 15  ALT 14  ALKPHOS 99  BILITOT 0.8  PROT 6.0  ALBUMIN 2.3*    Anemia Panel:  Basename 02/23/11 0600 02/22/11 1230  VITAMINB12 -- 1304*  FOLATE -- 6.7  FERRITIN -- 515*  TIBC -- 158*  IRON -- 16*  RETICCTPCT 2.7 --     ASSESSMENT AND PLAN:  Patient Active Hospital Problem List: Anemia (02/21/2011)   Assessment: presdumed hemolytic   Plan: per heme  Pt is concerned re  steroids and DM Atrial flutter/fib (02/16/2011)   Assessment: has reverted to sinus   Plan: will need long tern [employer] aquired pneumonia (02/20/2011)   Assessment: has + culture for pneumoccocus and concern for Mycoplasma   Plan: spoke with ID  Daiva Eves and will need two drug coverage  With b-lactam added to doxy  Will use augmentin for 7d  DM (diabetes mellitus) (02/16/2011)   Assessment:  Big problem with steroids   Plan: per Tiad      Signed, Sherryl Manges MD  02/23/2011 '

## 2011-02-23 NOTE — Progress Notes (Signed)
Hemoglobin up to 9.8 without further transfusion. Retic 2.7% LDH, bilirubin pending. I antigen not detected on red cells (cold auto-antibodies). Clinically stable.  Impression:  Suspect episode of self limited hemolysis infection related (Mycoplasma?) or atypical drug reaction (Avelox?). I have started him on steroids.  Will need to monitor sugars in this insulin dependent diabetic. Would observe in hospital at least another 24 hours. If he continues to improve, would change to PO Biaxin pending Mycoplasma titers.  Anticipate only short course of prednisone 1 mg/kg PO daily X 2 weeks then rapid taper. Discussed w pt and wife.

## 2011-02-23 NOTE — Progress Notes (Signed)
Patient converted to afib heart rate 120-140s sustaining.  BP 138/64. Pt asymptomatic.  NP called and ordered received for IV metoprolol.  Will continue to monitor heart rate. Nickolas Madrid

## 2011-02-23 NOTE — Progress Notes (Signed)
Utilization review completed. Eliany Mccarter, RN, BSN. 02/23/11 

## 2011-02-23 NOTE — Progress Notes (Signed)
Patient blood glucose reading >600 on glucose machine, patient kussmaul breathing resp rate 35/min.  MD called and orders received to start patient on glucose stablizer.  Will continue to monitor patient. Thomas Sandoval

## 2011-02-23 NOTE — Progress Notes (Signed)
Subjective: States his blood  have been high so far today, denies shortness of breath denies chest pain. Objective: Vital signs in last 24 hours: Temp:  [97.2 F (36.2 C)-99.2 F (37.3 C)] 98.3 F (36.8 C) (12/17 0529) Pulse Rate:  [82-103] 89  (12/17 0529) Resp:  [18-30] 20  (12/17 0529) BP: (122-142)/(48-78) 124/63 mmHg (12/17 0529) SpO2:  [90 %-94 %] 90 % (12/17 0918) Weight change:  Last BM Date: 02/18/11  Intake/Output from previous day: 12/16 0701 - 12/17 0700 In: 2075 [P.O.:720; I.V.:555; Blood:800] Out: 1050 [Urine:1050]     Physical Exam: General: Alert, in no apparent distress HEENT: No bruits, no goiter. Heart: Regular rate and rhythm, without murmurs, rubs, gallops. Lungs: No rhonchi, or wheezes, scattered crackles at the bases. Abdomen: Soft, nontender, nondistended, positive bowel sounds. Extremities: No clubbing cyanosis decreased edema Neuro: Grossly intact, nonfocal.    Lab Results: Basic Metabolic Panel:  Basename 02/23/11 0600 02/22/11 0421  NA 124* 125*  K 5.1 4.0  CL 87* 90*  CO2 21 26  GLUCOSE 412* 344*  BUN 17 15  CREATININE 0.87 0.91  CALCIUM 8.3* 8.1*  MG -- --  PHOS -- --   Liver Function Tests:  Basename 02/23/11 0600  AST 15  ALT 14  ALKPHOS 99  BILITOT 0.8  PROT 6.0  ALBUMIN 2.3*   CBC:  Basename 02/23/11 0600 02/22/11 2000 02/22/11 1320  WBC 10.8* -- 11.7*  NEUTROABS 10.1* -- 9.6*  HGB 9.8* 8.8* --  HCT 27.8* 24.7* --  MCV 85.0 -- 84.6  PLT 312 -- 273   CBG:  Basename 02/23/11 0538 02/23/11 0219 02/22/11 2108 02/22/11 1222 02/22/11 0724 02/21/11 2135  GLUCAP 389* 377* 294* 376* 323* 394*   Thyroid Function Tests: No results found for this basename: TSH,T4TOTAL,FREET4,T3FREE,THYROIDAB in the last 72 hours Coagulation: No results found for this basename: LABPROT:2,INR:2 in the last 72 hours Urine Drug Screen: Drugs of Abuse     Component Value Date/Time   LABOPIA NEGATIVE 04/28/2009 0547   COCAINSCRNUR  NEGATIVE 04/28/2009 0547   LABBENZ NEGATIVE 04/28/2009 0547   AMPHETMU NEGATIVE 04/28/2009 0547     Recent Results (from the past 240 hour(s))  CULTURE, SPUTUM-ASSESSMENT     Status: Normal   Collection Time   02/17/11  1:26 PM      Component Value Range Status Comment   Specimen Description SPUTUM   Final    Special Requests NONE   Final    Sputum evaluation     Final    Value: THIS SPECIMEN IS ACCEPTABLE. RESPIRATORY CULTURE REPORT TO FOLLOW.   Report Status 02/17/2011 FINAL   Final   CULTURE, RESPIRATORY     Status: Normal (Preliminary result)   Collection Time   02/17/11  1:26 PM      Component Value Range Status Comment   Specimen Description SPUTUM   Final    Special Requests NONE   Final    Gram Stain     Final    Value: ABUNDANT WBC PRESENT, PREDOMINANTLY PMN     NO SQUAMOUS EPITHELIAL CELLS SEEN     ABUNDANT GRAM POSITIVE COCCI     IN PAIRS   Culture ABUNDANT STREPTOCOCCUS PNEUMONIAE   Final    Report Status PENDING   Incomplete   MRSA PCR SCREENING     Status: Normal   Collection Time   02/17/11  5:20 PM      Component Value Range Status Comment   MRSA by PCR NEGATIVE  NEGATIVE  Final     Studies/Results: Ct Abdomen Pelvis Wo Contrast  02/21/2011  *RADIOLOGY REPORT*  Clinical Data: Evaluate for retroperitoneal hematoma  CT ABDOMEN AND PELVIS WITHOUT CONTRAST  Technique:  Multidetector CT imaging of the abdomen and pelvis was performed following the standard protocol without intravenous contrast.  Comparison: none  Findings:  Small bilateral pleural effusions identified.  There is airspace consolidation within both lower lobes.  Gallbladder appears collapsed.  The biliary tree is negative.  No focal liver abnormality identified. The pancreas is unremarkable.  The spleen negative.  The adrenal glands are negative.  Normal appearance of the right kidney.  The left kidney is normal.  No upper abdominal adenopathy.  There is no pelvic or inguinal adenopathy noted.  Urinary  bladder appears within normal limits.  Within the right posterior bladder base there is a focal filling defect which measures 2.4 cm and is intermediate density.  This is indeterminate.  Focal bladder neoplasm or blood clot cannot be excluded.  The prostate gland appears normal.  There is no free fluid or fluid collections within the abdomen or pelvis.  The stomach and the small bowel loops have a normal course and caliber.  There is no evidence for hematoma within the abdomen with the pelvis.  Review of the visualized osseous structures is significant for degenerative disc disease within the lumbar spine.  IMPRESSION:  1.  There is no evidence for hematoma within the abdomen or pelvis. 2.  Indeterminant filling defect within the bladder base.  Cannot rule out bladder neoplasm or blood clot. 3.  Bilateral pleural effusions with bilateral lung base infiltrates.  Original Report Authenticated By: Rosealee Albee, M.D.   Pro brain natruretic peptide -595.9 Medications: Scheduled Meds:    . amoxicillin-clavulanate  1 tablet Oral Q12H  . diltiazem  240 mg Oral Daily  . doxycycline  100 mg Oral Q12H  . fluconazole  150 mg Oral Once  . Fluticasone-Salmeterol  1 puff Inhalation Q12H  . furosemide  20 mg Intravenous Once  . furosemide  40 mg Oral Daily  . insulin aspart  0-20 Units Subcutaneous TID WC  . insulin aspart  0-5 Units Subcutaneous QHS  . insulin glargine  37 Units Subcutaneous QHS  . levalbuterol  2 puff Inhalation Q6H  . methylPREDNISolone (SOLU-MEDROL) injection  60 mg Intravenous Q24H  . nystatin  10 mL Oral QID  . DISCONTD: aspirin  325 mg Oral Daily  . DISCONTD: azithromycin  500 mg Intravenous Q24H  . DISCONTD: enoxaparin (LOVENOX) injection  40 mg Subcutaneous Q24H  . DISCONTD: furosemide  20 mg Intravenous Once  . DISCONTD: insulin aspart  0-15 Units Subcutaneous TID WC  . DISCONTD: insulin glargine  32 Units Subcutaneous QHS  . DISCONTD: moxifloxacin  400 mg Oral q1800    Continuous Infusions:   PRN Meds:.acetaminophen, acetaminophen, chlorpheniramine-HYDROcodone, nitroGLYCERIN, ondansetron (ZOFRAN) IV, ondansetron, phenol  Assessment/Plan:  Active Problems:  Atrial flutter/fib  URTI (acute upper respiratory infection)  DM (diabetes mellitus)  Community aquired pneumonia  COPD (chronic obstructive pulmonary disease)  Hypoxemia  Anemia  Hemolysis  #1Uncontrolled DM/ hyperglycemia -sugar of greater than 600 noted the CBG this morning, started on glucose nebulizer, obtain be met follow and transition to  Lantus. He is non-acidotic His worsened blood glucose control is secondary to the high-dose steroids. #2Anemia -hemoglobin improved status post transfusion, or hematology.  stool guaiac negative, CT scan of abdomen and pelvis with no evidence of retroperitoneal bleed. Hematology consulted and assisting with  further recommendations - main consideration non-immune hemolysis and patient is empirically started on steroids. Regions antibiotics also changed to azithromycin to cover for possible mycoplasma pneumonia. Patient  transfused and packed red blood cell hemoglobin improving, also further transfused today, follow.  #3 atrial flutter: Per cardiology.  #4 pneumonia, multilobar- on Zithromax to cover for him possible mycoplasma pneumonia.  #COPD/hypoxia - continue bronchodilators, Advair, antibiotics as above and supplemental. Patient seen by pulmonology and outpatient workup for ? Interstitial lung disease/fibrosis recommended. A brain natruretic peptide of only mildly elevated at 595.9, he received Lasix on when necessary basis. #5 hypokalemia-resolved. #6 hyponatremia - now with pseudohyponatremia secondary to elevated blood glucose, follow. #7 bladder filling defect-blood versus neoplasm, recommend urology consult.  LOS: 8 days   Kela Millin [facility] Pager: [phone] 02/23/2011, 10:35 AM   A

## 2011-02-24 ENCOUNTER — Emergency Department (HOSPITAL_COMMUNITY)
Admission: EM | Admit: 2011-02-24 | Discharge: 2011-02-25 | Disposition: A | Discharge status: Home / Self Care | Payer: BC Managed Care – PPO | Source: Home / Self Care | Admitting: Internal Medicine

## 2011-02-24 ENCOUNTER — Encounter (HOSPITAL_COMMUNITY): Payer: Self-pay | Admitting: *Deleted

## 2011-02-24 LAB — HEMOGLOBIN AND HEMATOCRIT, BLOOD: Hemoglobin: 9.8 g/dL — ABNORMAL LOW (ref 13.0–17.0)

## 2011-02-24 LAB — GLUCOSE, CAPILLARY: Glucose-Capillary: 211 mg/dL — ABNORMAL HIGH (ref 70–99)

## 2011-02-24 MED ORDER — INSULIN ASPART 100 UNIT/ML ~~LOC~~ SOLN
0.0000 [IU] | SUBCUTANEOUS | Status: DC
Start: 2011-02-24 — End: 2011-02-24

## 2011-02-24 MED ORDER — INSULIN GLARGINE 100 UNIT/ML ~~LOC~~ SOLN
10.0000 [IU] | SUBCUTANEOUS | Status: AC
  Administered 2011-02-24: 10 [IU] via SUBCUTANEOUS

## 2011-02-24 NOTE — ED Notes (Signed)
Called patient 3 times and NO ANSWER. 

## 2011-02-24 NOTE — ED Notes (Signed)
Called x2 in triage waiting and main waiting with no response.

## 2011-02-24 NOTE — ED Notes (Signed)
Contacted [place] cardiology per RN

## 2011-02-24 NOTE — Progress Notes (Signed)
Pt removed telemetry box and RN was sent to room to replace box. Upon entering the room, pt informed RN that he was not staying, he wanted his IV out and he was not putting the telemetry box back on. RN informed pt of his current medical situation and that it was in his best interest to stay. Pt refused and said he was leaving. Charge RN was also sent to room to talk to pt, in addition to Dr. Kathrynn Speed. Pt still refused to stay. RN had pt sign AMA form. Pt left with wife and all personal belongings. Steva Colder [facility] 02/24/2011 2:51 AM

## 2011-02-24 NOTE — ED Notes (Signed)
Patient was in patient and left last night ama.  He states he has had pneumonia.  Patient has returned today to be readmitted.  Patient states he called his md and was advised to return to Ed.

## 2011-02-25 ENCOUNTER — Inpatient Hospital Stay (HOSPITAL_COMMUNITY)
Admission: EM | Admit: 2011-02-25 | Discharge: 2011-02-26 | DRG: 544 | Disposition: A | Discharge status: Home / Self Care | Payer: BC Managed Care – PPO | Attending: Internal Medicine | Admitting: Internal Medicine

## 2011-02-25 ENCOUNTER — Emergency Department (HOSPITAL_COMMUNITY): Payer: BC Managed Care – PPO

## 2011-02-25 ENCOUNTER — Other Ambulatory Visit: Payer: Self-pay

## 2011-02-25 ENCOUNTER — Encounter (HOSPITAL_COMMUNITY): Payer: Self-pay | Admitting: *Deleted

## 2011-02-25 DIAGNOSIS — E10649 Type 1 diabetes mellitus with hypoglycemia without coma: Secondary | ICD-10-CM | POA: Diagnosis present

## 2011-02-25 DIAGNOSIS — I4892 Unspecified atrial flutter: Principal | ICD-10-CM | POA: Diagnosis present

## 2011-02-25 DIAGNOSIS — J449 Chronic obstructive pulmonary disease, unspecified: Secondary | ICD-10-CM | POA: Diagnosis present

## 2011-02-25 DIAGNOSIS — D649 Anemia, unspecified: Secondary | ICD-10-CM | POA: Diagnosis present

## 2011-02-25 DIAGNOSIS — I484 Atypical atrial flutter: Secondary | ICD-10-CM | POA: Diagnosis present

## 2011-02-25 DIAGNOSIS — E119 Type 2 diabetes mellitus without complications: Secondary | ICD-10-CM | POA: Diagnosis present

## 2011-02-25 DIAGNOSIS — R531 Weakness: Secondary | ICD-10-CM

## 2011-02-25 DIAGNOSIS — J441 Chronic obstructive pulmonary disease with (acute) exacerbation: Secondary | ICD-10-CM | POA: Diagnosis present

## 2011-02-25 HISTORY — DX: Gastro-esophageal reflux disease without esophagitis: K21.9

## 2011-02-25 LAB — URINALYSIS, ROUTINE W REFLEX MICROSCOPIC
Glucose, UA: >1000 mg/dL — AB
Hgb urine dipstick: NEGATIVE
Specific Gravity, Urine: 1.018 (ref 1.005–1.030)
Urobilinogen, UA: 1 mg/dL (ref 0.0–1.0)
pH: 7 (ref 5.0–8.0)

## 2011-02-25 LAB — CBC
HCT: 30.6 % — ABNORMAL LOW (ref 39.0–52.0)
MCHC: 35 g/dL (ref 30.0–36.0)
MCV: 87.2 fL (ref 78.0–100.0)
Platelets: 465 10*3/uL — ABNORMAL HIGH (ref 150–400)
RDW: 14.5 % (ref 11.5–15.5)

## 2011-02-25 LAB — DIFFERENTIAL
Basophils Absolute: 0 10*3/uL (ref 0.0–0.1)
Basophils Relative: 0 % (ref 0–1)
Eosinophils Relative: 1 % (ref 0–5)
Monocytes Absolute: 0.9 10*3/uL (ref 0.1–1.0)

## 2011-02-25 LAB — PRO B NATRIURETIC PEPTIDE: Pro B Natriuretic peptide (BNP): 481 pg/mL — ABNORMAL HIGH (ref 0–125)

## 2011-02-25 LAB — COMPREHENSIVE METABOLIC PANEL
AST: 13 U/L (ref 0–37)
Albumin: 2.3 g/dL — ABNORMAL LOW (ref 3.5–5.2)
Calcium: 8.1 mg/dL — ABNORMAL LOW (ref 8.4–10.5)
Creatinine, Ser: 0.74 mg/dL (ref 0.50–1.35)
GFR calc non Af Amer: >90 mL/min (ref >90)
Total Protein: 5.9 g/dL — ABNORMAL LOW (ref 6.0–8.3)

## 2011-02-25 LAB — GLUCOSE, CAPILLARY: Glucose-Capillary: 300 mg/dL — ABNORMAL HIGH (ref 70–99)

## 2011-02-25 LAB — CARDIAC PANEL(CRET KIN+CKTOT+MB+TROPI)
Relative Index: INVALID (ref 0.0–2.5)
Total CK: 35 U/L (ref 7–232)
Troponin I: <0.3 ng/mL (ref <0.30)

## 2011-02-25 MED ORDER — ONDANSETRON HCL 4 MG/2ML IJ SOLN: 4.0000 mg | Freq: Four times a day (QID) | INTRAMUSCULAR | Status: DC | PRN

## 2011-02-25 MED ORDER — LORATADINE 10 MG PO TABS
10.0000 mg | ORAL_TABLET | Freq: Every day | ORAL | Status: DC
  Administered 2011-02-25: 10 mg via ORAL
  Filled 2011-02-25 (×2): qty 1

## 2011-02-25 MED ORDER — INSULIN ASPART 100 UNIT/ML ~~LOC~~ SOLN
0.0000 [IU] | Freq: Three times a day (TID) | SUBCUTANEOUS | Status: DC
  Administered 2011-02-26 (×2): 3 [IU] via SUBCUTANEOUS
  Filled 2011-02-25: qty 3

## 2011-02-25 MED ORDER — SODIUM CHLORIDE 0.9 % IV SOLN
Freq: Once | INTRAVENOUS | Status: AC
  Administered 2011-02-25: 19:00:00 via INTRAVENOUS

## 2011-02-25 MED ORDER — ACETAMINOPHEN 325 MG PO TABS: 650.0000 mg | ORAL_TABLET | Freq: Four times a day (QID) | ORAL | Status: DC | PRN

## 2011-02-25 MED ORDER — ACETAMINOPHEN 650 MG RE SUPP: 650.0000 mg | Freq: Four times a day (QID) | RECTAL | Status: DC | PRN

## 2011-02-25 MED ORDER — SODIUM CHLORIDE 0.9 % IJ SOLN
3.0000 mL | Freq: Two times a day (BID) | INTRAMUSCULAR | Status: DC
  Administered 2011-02-25 – 2011-02-26 (×2): 3 mL via INTRAVENOUS

## 2011-02-25 MED ORDER — FLUTICASONE-SALMETEROL 250-50 MCG/DOSE IN AEPB
1.0000 | INHALATION_SPRAY | Freq: Two times a day (BID) | RESPIRATORY_TRACT | Status: DC
  Filled 2011-02-25: qty 14

## 2011-02-25 MED ORDER — ALBUTEROL SULFATE (5 MG/ML) 0.5% IN NEBU: 2.5000 mg | INHALATION_SOLUTION | RESPIRATORY_TRACT | Status: DC | PRN

## 2011-02-25 MED ORDER — ALBUTEROL SULFATE HFA 108 (90 BASE) MCG/ACT IN AERS
2.0000 | INHALATION_SPRAY | RESPIRATORY_TRACT | Status: DC | PRN
  Filled 2011-02-25: qty 6.7

## 2011-02-25 MED ORDER — INSULIN GLARGINE 100 UNIT/ML ~~LOC~~ SOLN
32.0000 [IU] | Freq: Every day | SUBCUTANEOUS | Status: DC
  Administered 2011-02-26: 32 [IU] via SUBCUTANEOUS
  Filled 2011-02-25: qty 3

## 2011-02-25 MED ORDER — ONDANSETRON HCL 4 MG PO TABS: 4.0000 mg | ORAL_TABLET | Freq: Four times a day (QID) | ORAL | Status: DC | PRN

## 2011-02-25 MED ORDER — FAMOTIDINE 20 MG PO TABS
20.0000 mg | ORAL_TABLET | Freq: Every day | ORAL | Status: DC
  Administered 2011-02-25 – 2011-02-26 (×2): 20 mg via ORAL
  Filled 2011-02-25 (×2): qty 1

## 2011-02-25 MED ORDER — INSULIN GLARGINE 100 UNIT/ML ~~LOC~~ SOLN
5.0000 [IU] | SUBCUTANEOUS | Status: AC
  Administered 2011-02-25: 5 [IU] via SUBCUTANEOUS

## 2011-02-25 NOTE — ED Provider Notes (Signed)
History     CSN: [id number] Arrival date & time: 02/25/2011  1:52 PM   First MD Initiated Contact with Patient 02/25/11 1802      Chief Complaint  Patient presents with  . Shortness of Breath  . Weakness    (Consider location/radiation/quality/duration/timing/severity/associated sxs/prior treatment) Patient is a 55 y.o. male presenting with shortness of breath and weakness. The history is provided by the patient and the spouse.  Shortness of Breath  Associated symptoms include shortness of breath.  Weakness  Additional symptoms include weakness.   patient here with weakness and lower extremity edema times a week. Was recently in the hospital and admitted for pneumonia and developed a hemolytic anemia requiring blood transfusion. Patient also was in atrial fibrillation with rapid ventricular rate response and was seen by cardiology for this. Patient left the hospital AGAINST MEDICAL ADVICE yesterday. He came back to the ER or yesterday after leaving the hospital and did not want to wait in the waiting room so he presents again today to 2 persistent weakness. No fever or, vomiting, diarrhea. Nothing makes symptoms better or worse  Past Medical History  Diagnosis Date  . Diabetes mellitus   . Tuberculosis   . Hemolysis 02/23/2011    No past surgical history on file.  No family history on file.  History  Substance Use Topics  . Smoking status: Current Everyday Smoker -- 2.0 packs/day    Types: Cigarettes  . Smokeless tobacco: Not on file  . Alcohol Use: No      Review of Systems  Respiratory: Positive for shortness of breath.   Neurological: Positive for weakness.  All other systems reviewed and are negative.    Allergies  Zithromax  Home Medications   Current Outpatient Rx  Name Route Sig Dispense Refill  . ALBUTEROL SULFATE HFA 108 (90 BASE) MCG/ACT IN AERS Inhalation Inhale 2 puffs into the lungs every 4 (four) hours as needed for wheezing. 1 Inhaler 3  .  FAMOTIDINE 20 MG PO TABS Oral Take 20 mg by mouth at bedtime as needed. For indigestion     . FLUTICASONE-SALMETEROL 250-50 MCG/DOSE IN AEPB Inhalation Inhale 1 puff into the lungs every 12 (twelve) hours.      . IBUPROFEN 200 MG PO TABS Oral Take 400 mg by mouth every 6 (six) hours as needed. For pain      . INSULIN ASPART 100 UNIT/ML [place] SOLN Subcutaneous Inject 2-8 Units into the skin 3 (three) times daily before meals. Take as directed per sliding scale    . INSULIN GLARGINE 100 UNIT/ML [place] SOLN Subcutaneous Inject 32 Units into the skin daily.     Marland Kitchen LORATADINE 10 MG PO TABS Oral Take 10 mg by mouth at bedtime.        BP 115/65  Pulse 122  Temp(Src) 97.5 F (36.4 C) (Oral)  Resp 30  SpO2 95%  Physical Exam  Nursing note and vitals reviewed. Constitutional: He is oriented to person, place, and time. He appears well-developed and well-nourished.  Non-toxic appearance. No distress.  HENT:  Head: Normocephalic and atraumatic.  Eyes: Conjunctivae, EOM and lids are normal. Pupils are equal, round, and reactive to light.  Neck: Normal range of motion. Neck supple. No tracheal deviation present. No mass present.  Cardiovascular: Normal rate, regular rhythm and normal heart sounds.  Exam reveals no gallop.   No murmur heard. Pulmonary/Chest: Effort normal and breath sounds normal. No stridor. No respiratory distress. He has no decreased breath sounds. He has  no wheezes. He has no rhonchi. He has no rales.  Abdominal: Soft. Normal appearance and bowel sounds are normal. He exhibits no distension. There is no tenderness. There is no rebound and no CVA tenderness.  Musculoskeletal: Normal range of motion. He exhibits no edema and no tenderness.       Feet:  Neurological: He is alert and oriented to person, place, and time. He has normal strength. No cranial nerve deficit or sensory deficit. GCS eye subscore is 4. GCS verbal subscore is 5. GCS motor subscore is 6.  Skin: Skin is warm and dry. No  abrasion and no rash noted.  Psychiatric: He has a normal mood and affect. His speech is normal and behavior is normal.    ED Course  Procedures (including critical care time)   Labs Reviewed  CBC  DIFFERENTIAL  COMPREHENSIVE METABOLIC PANEL  URINALYSIS, ROUTINE W REFLEX MICROSCOPIC  URINE CULTURE  SAMPLE TO BLOOD BANK   No results found.   No diagnosis found.    MDM  Spoke with  hospitalist, patient to be readmitted       Toy Baker, MD 02/25/11 1952

## 2011-02-25 NOTE — ED Notes (Signed)
Patient has returned today to be readmitted.  He was here on yesterday and left due to wait

## 2011-02-25 NOTE — H&P (Addendum)
Thomas Sandoval is an 55 y.o. male.   PCP - [name]. Chief Complaint: Weakness. HPI: 55 year old male with history of diabetes mellitus type 2 COPD ongoing tobacco abuse, was admitted and patient signed out AGAINST MEDICAL ADVICE 2 days ago. Patient was admitted for atrial flutter with RVR and was found to have pneumonia for which he was started on antibiotics and eventually his hemoglobin was found to be decreasing and it came to as low as 5.5 from a level of 14. Hematologist was consulted and it was felt to be a hemolytic process possibly a reaction to his antibiotics. Patient was given 4 units of packed red blood cell transfusion and was placed prednisone. Patient eventually to sign out AMA with no specific reason. Patient has come to the ER today to continue his treatment. He still feels weak and increasing lower extremity edema. He noticed this lower extremity edema after he was hospitalized last week. He also feels weak and gets exhausted easily. His blood sugars have been running high presumably from recent steroid use. He has given extra doses of Lantus today in the morning despite which his blood sugars are still in the 300s. Patient denies any chest pain shortness of breath cough phlegm fever chills headache dizziness loss of consciousness nausea vomiting without pain or diarrhea.   Past Medical History  Diagnosis Date  . Diabetes mellitus   . Hemolysis 02/23/2011  . Blood transfusion   . GERD (gastroesophageal reflux disease)   . Headache   . Pneumonia   . Anemia     Past Surgical History  Procedure Date  . Tonsillectomy     Family History  Problem Relation Age of Onset  . Breast cancer Mother   . Rheumatic fever Father    Social History:  reports that he has been smoking Cigarettes.  He has been smoking about 2 packs per day. He does not have any smokeless tobacco history on file. He reports that he does not drink alcohol or use illicit drugs.  Allergies:    Allergies  Allergen Reactions  . Zithromax (Azithromycin) Other (See Comments)    Severe stomach cramps; told to list as an allergy by dr. Lawrence Marseilles ago    Medications Prior to Admission  Medication Dose Route Frequency Provider Last Rate Last Dose  . 0.9 %  sodium chloride infusion   Intravenous Once Toy Baker, MD 125 mL/hr at 02/25/11 1610    . acetaminophen (TYLENOL) tablet 650 mg  650 mg Oral Q6H PRN Eduard Clos       Or  . acetaminophen (TYLENOL) suppository 650 mg  650 mg Rectal Q6H PRN Eduard Clos      . albuterol (PROVENTIL HFA;VENTOLIN HFA) 108 (90 BASE) MCG/ACT inhaler 2 puff  2 puff Inhalation Q4H PRN Eduard Clos      . albuterol (PROVENTIL) (5 MG/ML) 0.5% nebulizer solution 2.5 mg  2.5 mg Nebulization Q2H PRN Eduard Clos      . famotidine (PEPCID) tablet 20 mg  20 mg Oral Daily Eduard Clos      . Fluticasone-Salmeterol (ADVAIR) 250-50 MCG/DOSE inhaler 1 puff  1 puff Inhalation Q12H Eduard Clos      . insulin glargine (LANTUS) injection 10 Units  10 Units Subcutaneous NOW Michael E. Hall   10 Units at 02/24/11 0007  . insulin glargine (LANTUS) injection 32 Units  32 Units Subcutaneous Daily Eduard Clos      . insulin glargine (LANTUS)  injection 5 Units  5 Units Subcutaneous NOW Eduard Clos      . loratadine (CLARITIN) tablet 10 mg  10 mg Oral QHS Eduard Clos      . ondansetron (ZOFRAN) tablet 4 mg  4 mg Oral Q6H PRN Eduard Clos       Or  . ondansetron (ZOFRAN) injection 4 mg  4 mg Intravenous Q6H PRN Eduard Clos      . sodium chloride 0.9 % injection 3 mL  3 mL Intravenous Q12H Eduard Clos      . DISCONTD: 0.9 %  sodium chloride infusion   Intravenous Continuous Adeline C Viyuoh 50 mL/hr at 02/23/11 1855    . DISCONTD: acetaminophen (TYLENOL) suppository 650 mg  650 mg Rectal Q6H PRN Rosalyn Gess. Hall      . DISCONTD: acetaminophen (TYLENOL) tablet 650 mg  650 mg Oral Q6H  PRN Rosalyn Gess. Hall   650 mg at 02/22/11 1109  . DISCONTD: amoxicillin-clavulanate (AUGMENTIN) 875-125 MG per tablet 1 tablet  1 tablet Oral Q12H Duke Salvia, MD   1 tablet at 02/23/11 2158  . DISCONTD: chlorpheniramine-HYDROcodone (TUSSIONEX) 10-8 MG/5ML suspension 5 mL  5 mL Oral Q12H PRN Rosalyn Gess. Hall   5 mL at 02/19/11 1706  . DISCONTD: dextrose 50 % solution 25 mL  25 mL Intravenous PRN Adeline C Viyuoh      . DISCONTD: diltiazem (CARDIZEM CD) 24 hr capsule 240 mg  240 mg Oral Daily Joline Salt Barrett, PA   240 mg at 02/23/11 1051  . DISCONTD: doxycycline (VIBRA-TABS) tablet 100 mg  100 mg Oral Q12H Dayna N Dunn, PA   100 mg at 02/23/11 2158  . DISCONTD: Fluticasone-Salmeterol (ADVAIR) 250-50 MCG/DOSE inhaler 1 puff  1 puff Inhalation Q12H Dayna N Dunn, PA   1 puff at 02/23/11 2044  . DISCONTD: furosemide (LASIX) tablet 40 mg  40 mg Oral Daily Duke Salvia, MD   40 mg at 02/23/11 1257  . DISCONTD: insulin aspart (novoLOG) injection 0-15 Units  0-15 Units Subcutaneous Q4H Michael E. Hall      . DISCONTD: insulin regular (NOVOLIN R,HUMULIN R) 1 Units/mL in sodium chloride 0.9 % 100 mL infusion   Intravenous Continuous Adeline C Viyuoh 10.6 mL/hr at 02/24/11 0144 10.6 Units/hr at 02/24/11 0144  . DISCONTD: insulin regular bolus via infusion 0-10 Units  0-10 Units Intravenous TID WC Adeline C Viyuoh   3.2 Units at 02/23/11 1756  . DISCONTD: levalbuterol (XOPENEX HFA) inhaler 2 puff  2 puff Inhalation BID Valera Castle, MD   2 puff at 02/23/11 2044  . DISCONTD: methylPREDNISolone sodium succinate (SOLU-MEDROL) 125 MG injection 60 mg  60 mg Intravenous Q24H Levert Feinstein, MD   60 mg at 02/23/11 1746  . DISCONTD: metoprolol (LOPRESSOR) injection 5 mg  5 mg Intravenous Q5 min PRN Ok Anis, NP   5 mg at 02/23/11 1430  . DISCONTD: nitroGLYCERIN (NITROSTAT) SL tablet 0.4 mg  0.4 mg Sublingual Q5 min PRN Angus Seller, PA      . DISCONTD: nystatin (MYCOSTATIN) 100000 UNIT/ML suspension  1,000,000 Units  10 mL Oral QID Joni Reining, NP   1,000,000 Units at 02/23/11 2158  . DISCONTD: ondansetron (ZOFRAN) injection 4 mg  4 mg Intravenous Q6H PRN Rosalyn Gess. Hall   4 mg at 02/19/11 0500  . DISCONTD: ondansetron (ZOFRAN) tablet 4 mg  4 mg Oral Q6H PRN Rosalyn Gess. Hall   4 mg at 02/20/11 1941  .  DISCONTD: phenol (CHLORASEPTIC) mouth spray 1 spray  1 spray Mouth/Throat PRN Adeline C Viyuoh   1 spray at 02/18/11 1340   Medications Prior to Admission  Medication Sig Dispense Refill  . albuterol (PROVENTIL HFA;VENTOLIN HFA) 108 (90 BASE) MCG/ACT inhaler Inhale 2 puffs into the lungs every 4 (four) hours as needed for wheezing.  1 Inhaler  3  . famotidine (PEPCID) 20 MG tablet Take 20 mg by mouth at bedtime as needed. For indigestion       . Fluticasone-Salmeterol (ADVAIR) 250-50 MCG/DOSE AEPB Inhale 1 puff into the lungs every 12 (twelve) hours.        Marland Kitchen ibuprofen (ADVIL,MOTRIN) 200 MG tablet Take 400 mg by mouth every 6 (six) hours as needed. For pain        . insulin aspart (NOVOLOG) 100 UNIT/ML injection Inject 2-8 Units into the skin 3 (three) times daily before meals. Take as directed per sliding scale      . insulin glargine (LANTUS) 100 UNIT/ML injection Inject 32 Units into the skin daily.       Marland Kitchen loratadine (CLARITIN) 10 MG tablet Take 10 mg by mouth at bedtime.          Results for orders placed during the hospital encounter of 02/25/11 (from the past 48 hour(s))  CBC     Status: Abnormal   Collection Time   02/25/11  6:14 PM      Component Value Range Comment   WBC 11.8 (*) 4.0 - 10.5 (K/uL)    RBC 3.51 (*) 4.22 - 5.81 (MIL/uL)    Hemoglobin 10.7 (*) 13.0 - 17.0 (g/dL)    HCT 16.1 (*) 09.6 - 52.0 (%)    MCV 87.2  78.0 - 100.0 (fL)    MCH 30.5  26.0 - 34.0 (pg)    MCHC 35.0  30.0 - 36.0 (g/dL)    RDW 04.5  40.9 - 81.1 (%)    Platelets 465 (*) 150 - 400 (K/uL)   DIFFERENTIAL     Status: Abnormal   Collection Time   02/25/11  6:14 PM      Component Value Range  Comment   Neutrophils Relative 81 (*) 43 - 77 (%)    Neutro Abs 9.5 (*) 1.7 - 7.7 (K/uL)    Lymphocytes Relative 11 (*) 12 - 46 (%)    Lymphs Abs 1.2  0.7 - 4.0 (K/uL)    Monocytes Relative 8  3 - 12 (%)    Monocytes Absolute 0.9  0.1 - 1.0 (K/uL)    Eosinophils Relative 1  0 - 5 (%)    Eosinophils Absolute 0.1  0.0 - 0.7 (K/uL)    Basophils Relative 0  0 - 1 (%)    Basophils Absolute 0.0  0.0 - 0.1 (K/uL)   COMPREHENSIVE METABOLIC PANEL     Status: Abnormal   Collection Time   02/25/11  6:14 PM      Component Value Range Comment   Sodium 130 (*) 135 - 145 (mEq/L)    Potassium 3.7  3.5 - 5.1 (mEq/L)    Chloride 96  96 - 112 (mEq/L)    CO2 25  19 - 32 (mEq/L)    Glucose, Bld 320 (*) 70 - 99 (mg/dL)    BUN 16  6 - 23 (mg/dL)    Creatinine, Ser 9.14  0.50 - 1.35 (mg/dL)    Calcium 8.1 (*) 8.4 - 10.5 (mg/dL)    Total Protein 5.9 (*) 6.0 - 8.3 (g/dL)  Albumin 2.3 (*) 3.5 - 5.2 (g/dL)    AST 13  0 - 37 (U/L)    ALT 15  0 - 53 (U/L)    Alkaline Phosphatase 85  39 - 117 (U/L)    Total Bilirubin 0.5  0.3 - 1.2 (mg/dL)    GFR calc non Af Amer >90  >90 (mL/min)    GFR calc Af Amer >90  >90 (mL/min)   SAMPLE TO BLOOD BANK     Status: Normal   Collection Time   02/25/11  6:15 PM      Component Value Range Comment   Blood Bank Specimen SAMPLE AVAILABLE FOR TESTING      Sample Expiration 02/26/2011     PRO B NATRIURETIC PEPTIDE     Status: Abnormal   Collection Time   02/25/11  8:03 PM      Component Value Range Comment   Pro B Natriuretic peptide (BNP) 481.0 (*) 0 - 125 (pg/mL)   CARDIAC PANEL(CRET KIN+CKTOT+MB+TROPI)     Status: Normal   Collection Time   02/25/11  8:04 PM      Component Value Range Comment   Total CK 36  7 - 232 (U/L)    CK, MB 1.8  0.3 - 4.0 (ng/mL)    Troponin I <0.30  <0.30 (ng/mL)    Relative Index RELATIVE INDEX IS INVALID  0.0 - 2.5    URINALYSIS, ROUTINE W REFLEX MICROSCOPIC     Status: Abnormal   Collection Time   02/25/11  8:13 PM      Component  Value Range Comment   Color, Urine YELLOW  YELLOW     APPearance CLEAR  CLEAR     Specific Gravity, Urine 1.018  1.005 - 1.030     pH 7.0  5.0 - 8.0     Glucose, UA >1000 (*) NEGATIVE (mg/dL)    Hgb urine dipstick NEGATIVE  NEGATIVE     Bilirubin Urine NEGATIVE  NEGATIVE     Ketones, ur NEGATIVE  NEGATIVE (mg/dL)    Protein, ur NEGATIVE  NEGATIVE (mg/dL)    Urobilinogen, UA 1.0  0.0 - 1.0 (mg/dL)    Nitrite NEGATIVE  NEGATIVE     Leukocytes, UA NEGATIVE  NEGATIVE    URINE MICROSCOPIC-ADD ON     Status: Normal   Collection Time   02/25/11  8:13 PM      Component Value Range Comment   WBC, UA 0-2  <3 (WBC/hpf)    Dg Chest 2 View  02/25/2011  *RADIOLOGY REPORT*  Clinical Data: Chest pain and short of breath  CHEST - 2 VIEW  Comparison: 02/20/2011  Findings: Improvement in bilateral airspace disease since the prior study.  This may be due to resolution of edema or pneumonia. Resolution of bilateral pleural effusions since the prior study. Negative for vascular congestion.  IMPRESSION: Resolution of bilateral airspace disease and bilateral effusions likely due to resolving edema.  Lungs are now clear.  Original Report Authenticated By: Camelia Phenes, M.D.    Review of Systems  HENT: Negative.   Eyes: Negative.   Respiratory: Negative.   Cardiovascular: Negative.   Gastrointestinal: Negative.   Genitourinary: Negative.   Musculoskeletal:       Increasing edema of the lower extremities.  Skin: Negative.   Neurological: Positive for weakness.  Endo/Heme/Allergies: Negative.   Psychiatric/Behavioral: Negative.     Blood pressure 138/75, pulse 114, temperature 97.3 F (36.3 C), temperature source Oral, resp. rate 25, height 5\' 9"  (1.753  m), SpO2 92.00%. Physical Exam  Constitutional: He is oriented to person, place, and time. He appears well-developed and well-nourished. No distress.  HENT:  Head: Normocephalic and atraumatic.  Nose: Nose normal.  Mouth/Throat: Oropharynx is  clear and moist.       Decreased hearing booth ears.  Eyes: Conjunctivae and EOM are normal. Pupils are equal, round, and reactive to light. Right eye exhibits no discharge. Left eye exhibits no discharge.  Neck: Normal range of motion. Neck supple. No JVD present.  Cardiovascular: Normal rate, regular rhythm and normal heart sounds.   Respiratory: Effort normal and breath sounds normal. No respiratory distress. He has no wheezes. He has no rales.  GI: Soft. Bowel sounds are normal. He exhibits no distension. There is no tenderness. There is no rebound.  Musculoskeletal: He exhibits edema. He exhibits no tenderness.       2+ edema both lower extremities.  Neurological: He is alert and oriented to person, place, and time. He has normal reflexes.  Skin: Skin is warm and dry. No rash noted. He is not diaphoretic. No erythema.  Psychiatric: His behavior is normal.     Assessment/Plan  #1. Generalized weakness and lower extremity edema and recent admission for atrial flutter with RVR  - we'll check a 2-D echo, proBNP, EKG, cardiac enzymes and Doppler of lower extremities. Presently his chest x-ray shows resolution of the edema when compared to the old one. His monitor is showing sinus rhythm at this time rate is around 90 per minute. Based on the tests ordered Will have further recommendations. He will need lasix eventually and his hyponatremia could be from fluid overload status. #2. Anemia with recent admission showing hemolytic process to antibiotics  - we will closely follow CBC and in a.m., let hematologist know about the admission. #3. Uncontrolled diabetes mellitus type I - I'm going to order one dose of Lantus 5 units subcutaneous now place him on sliding scale and continue his regular home dose of Lantus. He has received prednisone which could be contributing his high blood sugar levels.  #4. COPD and ongoing tobacco abuse - we'll continue his nebulizers and inhalers. Will need tobacco  cessation counseling.  CODE STATUS - full code.   Eduard Clos 02/25/2011, 9:44 PM

## 2011-02-25 NOTE — ED Notes (Signed)
5509-01 READY

## 2011-02-25 NOTE — Progress Notes (Signed)
02/25/11 Nursing 2155  Notified Dr. Eilene Ghazi that patient's EKG showed NSR. Notified MD that patient's CBG is 300. No new orders given. Will continue to monitor. Nelda Marseille, RN

## 2011-02-26 ENCOUNTER — Other Ambulatory Visit: Payer: Self-pay

## 2011-02-26 LAB — COMPREHENSIVE METABOLIC PANEL
ALT: 15 U/L (ref 0–53)
AST: 13 U/L (ref 0–37)
Albumin: 2.2 g/dL — ABNORMAL LOW (ref 3.5–5.2)
CO2: 25 mEq/L (ref 19–32)
Calcium: 8.2 mg/dL — ABNORMAL LOW (ref 8.4–10.5)
Sodium: 131 mEq/L — ABNORMAL LOW (ref 135–145)
Total Protein: 5.5 g/dL — ABNORMAL LOW (ref 6.0–8.3)

## 2011-02-26 LAB — CARDIAC PANEL(CRET KIN+CKTOT+MB+TROPI)
CK, MB: 1.5 ng/mL (ref 0.3–4.0)
Relative Index: INVALID (ref 0.0–2.5)
Relative Index: INVALID (ref 0.0–2.5)
Total CK: 29 U/L (ref 7–232)
Troponin I: <0.3 ng/mL (ref <0.30)

## 2011-02-26 LAB — URINE CULTURE: Culture: NO GROWTH

## 2011-02-26 LAB — CBC
HCT: 33 % — ABNORMAL LOW (ref 39.0–52.0)
Hemoglobin: 11.4 g/dL — ABNORMAL LOW (ref 13.0–17.0)
MCH: 30.4 pg (ref 26.0–34.0)
MCHC: 34.5 g/dL (ref 30.0–36.0)
MCV: 88 fL (ref 78.0–100.0)

## 2011-02-26 LAB — GLUCOSE, CAPILLARY
Glucose-Capillary: 171 mg/dL — ABNORMAL HIGH (ref 70–99)
Glucose-Capillary: 176 mg/dL — ABNORMAL HIGH (ref 70–99)
Glucose-Capillary: 66 mg/dL — ABNORMAL LOW (ref 70–99)

## 2011-02-26 LAB — MAGNESIUM: Magnesium: 2 mg/dL (ref 1.5–2.5)

## 2011-02-26 LAB — COLD AGGLUTININ TITER

## 2011-02-26 LAB — TSH: TSH: 2.358 u[IU]/mL (ref 0.350–4.500)

## 2011-02-26 NOTE — Progress Notes (Signed)
   CARE MANAGEMENT NOTE 02/26/2011  Patient:  Thomas Sandoval, Thomas Sandoval   Account Number:  [id number]  Date Initiated:  02/26/2011  Documentation initiated by:  Letha Cape  Subjective/Objective Assessment:   dx aflutter  admit- lives with spouse.     Action/Plan:   Anticipated DC Date:  03/02/2011   Anticipated DC Plan:  HOME/SELF CARE      DC Planning Services  CM consult      Choice offered to / List presented to:             Status of service:  In process, will continue to follow Medicare Important Message given?   (If response is "NO", the following Medicare IM given date fields will be blank) Date Medicare IM given:   Date Additional Medicare IM given:    Discharge Disposition:    Per UR Regulation:    Comments:  02/26/11 13:44 Letha Cape RN, BSN [phone] Patient lives with spouse, pta independent.   NCM will continue to follow for dc needs.

## 2011-02-26 NOTE — Discharge Summary (Signed)
Admit date: 02/25/2011 Discharge date: 02/26/2011  Primary Care Physician:  Provider Not In System   Discharge Diagnoses:   No resolved problems to display.  Active Hospital Problems  Diagnoses Date Noted   . Atrial flutter/fib now sinus Rhythem 02/16/2011   . Anemia, ? Hemolytic, probably reaction to avelox ?  02/21/2011   . COPD (chronic obstructive pulmonary disease) 02/20/2011   . DM (diabetes mellitus) 02/16/2011              DISCHARGE MEDICATION: Current Discharge Medication List    CONTINUE these medications which have NOT CHANGED   Details  albuterol (PROVENTIL HFA;VENTOLIN HFA) 108 (90 BASE) MCG/ACT inhaler Inhale 2 puffs into the lungs every 4 (four) hours as needed for wheezing. Qty: 1 Inhaler, Refills: 3    famotidine (PEPCID) 20 MG tablet Take 20 mg by mouth at bedtime as needed. For indigestion     Fluticasone-Salmeterol (ADVAIR) 250-50 MCG/DOSE AEPB Inhale 1 puff into the lungs every 12 (twelve) hours.      ibuprofen (ADVIL,MOTRIN) 200 MG tablet Take 400 mg by mouth every 6 (six) hours as needed. For pain      insulin glargine (LANTUS) 100 UNIT/ML injection Inject 32 Units into the skin daily.     loratadine (CLARITIN) 10 MG tablet Take 10 mg by mouth at bedtime.        STOP taking these medications     insulin aspart (NOVOLOG) 100 UNIT/ML injection            Consults:  None   SIGNIFICANT DIAGNOSTIC STUDIES:  Ct Abdomen Pelvis Wo Contrast  02/21/2011  *RADIOLOGY REPORT*  Clinical Data: Evaluate for retroperitoneal hematoma  CT ABDOMEN AND PELVIS WITHOUT CONTRAST  Technique:  Multidetector CT imaging of the abdomen and pelvis was performed following the standard protocol without intravenous contrast.  Comparison: none  Findings:  Small bilateral pleural effusions identified.  There is airspace consolidation within both lower lobes.  Gallbladder appears collapsed.  The biliary tree is negative.  No focal liver abnormality identified. The pancreas  is unremarkable.  The spleen negative.  The adrenal glands are negative.  Normal appearance of the right kidney.  The left kidney is normal.  No upper abdominal adenopathy.  There is no pelvic or inguinal adenopathy noted.  Urinary bladder appears within normal limits.  Within the right posterior bladder base there is a focal filling defect which measures 2.4 cm and is intermediate density.  This is indeterminate.  Focal bladder neoplasm or blood clot cannot be excluded.  The prostate gland appears normal.  There is no free fluid or fluid collections within the abdomen or pelvis.  The stomach and the small bowel loops have a normal course and caliber.  There is no evidence for hematoma within the abdomen with the pelvis.  Review of the visualized osseous structures is significant for degenerative disc disease within the lumbar spine.  IMPRESSION:  1.  There is no evidence for hematoma within the abdomen or pelvis. 2.  Indeterminant filling defect within the bladder base.  Cannot rule out bladder neoplasm or blood clot. 3.  Bilateral pleural effusions with bilateral lung base infiltrates.  Original Report Authenticated By: Rosealee Albee, M.D.   Dg Chest 2 View  02/25/2011  *RADIOLOGY REPORT*  Clinical Data: Chest pain and short of breath  CHEST - 2 VIEW  Comparison: 02/20/2011  Findings: Improvement in bilateral airspace disease since the prior study.  This may be due to resolution of edema or pneumonia.  Resolution of bilateral pleural effusions since the prior study. Negative for vascular congestion.  IMPRESSION: Resolution of bilateral airspace disease and bilateral effusions likely due to resolving edema.  Lungs are now clear.  Original Report Authenticated By: Camelia Phenes, M.D.   Dg Chest 2 View  02/20/2011  *RADIOLOGY REPORT*  Clinical Data: Shortness of breath.  Weakness.  CHEST - 2 VIEW  Comparison: 02/17/2011.  02/18/2011.  Findings: Cardiac silhouette is normal in size.  There is slight  prominence of the main pulmonary artery segment.  There is an overall hyperinflation configuration with low-lying diaphragm. There is slight upper lobe vascular prominence.  There is an increase in the reticular interstitial markings.  Patchy infiltrative densities are seen in the upper lobes and lung bases. There is blunting of the posterior costophrenic angles consist with small amounts of pleural effusion with associated atelectasis and infiltrate.  There is some central peribronchial thickening.  IMPRESSION: Prominence of main pulmonary artery segment.  Overall hyperinflation configuration.  Mild upper lobe vascular prominence. Normal sized heart.  Increased reticular interstitial markings. Patchy infiltrative densities without consolidation in the upper lobes and in the lung bases.  Blunting of both posterior costophrenic angles consistent with small amounts of pleural effusion.  There is associated atelectasis and infiltrative density.  Central peribronchial thickening. This may be associated with bronchitis, asthma, and reactive airway disease.  Original Report Authenticated By: Crawford Givens, M.D.   Dg Chest 2 View  02/17/2011  *RADIOLOGY REPORT*  Clinical Data: Productive cough  CHEST - 2 VIEW  Comparison: Of 02/15/2011  Findings: The heart and mediastinal contours within normal limits. Pulmonary vascularity is normal.  There is mild peribronchial thickening at the lung bases.  No confluent airspace opacities or pulmonary edema.  No visible pleural effusion.  Negative for pneumothorax.  Mild pleuroparenchymal scarring at the lung apices. No acute bony abnormality identified.  IMPRESSION: Mild peribronchial thickening at the lung bases.  Findings can be seen with acute or chronic bronchitis, smokers, or patient's with asthma.  Original Report Authenticated By: Britta Mccreedy, M.D.   Dg Chest 2 View  02/15/2011  *RADIOLOGY REPORT*  Clinical Data: Cough and fever.  Shortness of breath.  Nausea vomiting.   CHEST - 2 VIEW  Comparison:  04/27/2009  Findings:  The heart size and mediastinal contours are within normal limits.  Both lungs are clear.  The visualized skeletal structures are unremarkable.  IMPRESSION: No active cardiopulmonary disease.  Original Report Authenticated By: Danae Orleans, M.D.   Dg Chest Port 1 View  02/18/2011  *RADIOLOGY REPORT*  Clinical Data: The kidney and  PORTABLE CHEST - 1 VIEW  Comparison: 02/17/2011  Findings: Normal heart size and pulmonary vascularity. Peribronchial thickening and mild interstitial changes in the lungs consistent with chronic bronchitis and fibrosis.  No focal airspace consolidation in the lungs.  No blunting of costophrenic angles. No pneumothorax.  No significant change since previous study, allowing for technical differences.  IMPRESSION: Chronic bronchitic changes and interstitial fibrosis in the lungs. No evidence of active consolidation.  Original Report Authenticated By: Marlon Pel, M.D.     Recent Results (from the past 240 hour(s))  CULTURE, SPUTUM-ASSESSMENT     Status: Normal   Collection Time   02/17/11  1:26 PM      Component Value Range Status Comment   Specimen Description SPUTUM   Final    Special Requests NONE   Final    Sputum evaluation     Final  Value: THIS SPECIMEN IS ACCEPTABLE. RESPIRATORY CULTURE REPORT TO FOLLOW.   Report Status 02/17/2011 FINAL   Final   CULTURE, RESPIRATORY     Status: Normal (Preliminary result)   Collection Time   02/17/11  1:26 PM      Component Value Range Status Comment   Specimen Description SPUTUM   Final    Special Requests NONE   Final    Gram Stain     Final    Value: ABUNDANT WBC PRESENT, PREDOMINANTLY PMN     NO SQUAMOUS EPITHELIAL CELLS SEEN     ABUNDANT GRAM POSITIVE COCCI     IN PAIRS   Culture ABUNDANT STREPTOCOCCUS PNEUMONIAE   Final    Report Status PENDING   Incomplete   MRSA PCR SCREENING     Status: Normal   Collection Time   02/17/11  5:20 PM      Component  Value Range Status Comment   MRSA by PCR NEGATIVE  NEGATIVE  Final     BRIEF ADMITTING H & P:  HPI: 54 year old male with history of diabetes mellitus type 2 COPD ongoing tobacco abuse, was admitted and patient signed out AGAINST MEDICAL ADVICE 2 days ago. Patient was admitted for atrial flutter with RVR and was found to have pneumonia for which he was started on antibiotics and eventually his hemoglobin was found to be decreasing and it came to as low as 5.5 from a level of 14. Hematologist was consulted and it was felt to be a hemolytic process possibly a reaction to his antibiotics. Patient was given 4 units of packed red blood cell transfusion and was placed prednisone. Patient eventually to sign out AMA with no specific reason. Patient has come to the ER today to continue his treatment. He still feels weak and increasing lower extremity edema. He noticed this lower extremity edema after he was hospitalized last week. He also feels weak and gets exhausted easily. His blood sugars have been running high presumably from recent steroid use. He has given extra doses of Lantus today in the morning despite which his blood sugars are still in the 300s. Patient denies any chest pain shortness of breath cough phlegm fever chills headache dizziness loss of consciousness nausea vomiting without pain or diarrhea.   No resolved problems to display.   Hospital Course:   1- Lower extremity edema: Probably secondary to hypoalbuminemia. Chest xray negative for pulmonary edema. He denies dyspnea. He might benefit of lasix. He wants to go home. I wont discharge him on lasix because and not sure if he will follow up with his PCP.  2-Anemia, Hemolytic: Patient had 4 gr drop hb during last admission. It was thought to be secondary to hemolytic process secondary to Medication (Avelox). Patient left against medical advised last admission. He was on prednisone while in the hospital. He was not taking prednisone at home. His  Hb has been 10 to 11 during this admission. I spoke with Dr Cyndie Chime, patient doesn't need prednisone. I advised patient to follow up with Dr Shelly Rubenstein.  3-Pneumonia: No evidence of PNA on chest x ray. Patient relates cough improved. WBC normal , no fevers.  Patient received treatment with Augmentin and Doxy last admission. I will not start antibiotics at this time. 4-A Fib/Flutter. Patient now sinus rhythm. HR 53 to 90. Had 1 episode sinus tachy in 120, then decrease to 80. Patient will need to follow with cardiology. [facility] will call him for appointment. He has not been on Cardizem.  Patient wants to go home.                                         Disposition and Follow-up:   Discharge Orders    Future Appointments: Provider: Department: Dept Phone: Center:   03/23/2011 11:00 AM Comer Locket. Vassie Loll, MD [facility] [phone] None     Future Orders Please Complete By Expires   Diet - low sodium heart healthy      Increase activity slowly                                      DISCHARGE EXAM: The physical exam is generally normal. He appears well, alert and oriented x 3, pleasant and cooperative. Vitals as noted. ENT normal, neck supple and free of adenopathy, or masses.  No thyromegaly or carotid bruits. Cranial nerves and fundi normal. Lungs are clear to auscultation. Heart sounds are normal, no murmurs, clicks, gallops or rubs. Abdomen is soft, no tenderness, masses or organomegaly. Extremities, 2 edema. peripheral pulses and reflexes are normal. Screening neurological exam is normal without focal findings. Skin is normal without suspicious lesions.   Blood pressure 141/81, pulse 88, temperature 98.3 F (36.8 C), temperature source Oral, resp. rate 23, height 5\' 9"  (1.753 m), weight 71.8 kg (158 lb 4.6 oz), SpO2 94.00%.   Basename 02/26/11 0550 02/25/11 1814  NA 131* 130*  K 3.9 3.7  CL 99 96  CO2 25 25  GLUCOSE 214* 320*  BUN 13 16  CREATININE 0.72  0.74  CALCIUM 8.2* 8.1*  MG 2.0 --  PHOS -- --    Basename 02/26/11 0550 02/25/11 1814  AST 13 13  ALT 15 15  ALKPHOS 80 85  BILITOT 0.4 0.5  PROT 5.5* 5.9*  ALBUMIN 2.2* 2.3*    Basename 02/26/11 0550 02/25/11 1814  WBC 8.3 11.8*  NEUTROABS -- 9.5*  HGB 11.4* 10.7*  HCT 33.0* 30.6*  MCV 88.0 87.2  PLT 456* 465*    Signed: Lindon Kiel M.D. 02/26/2011, 4:42 PM

## 2011-03-12 LAB — MYCOPLASMA PNEUMONIAE ANTIBODY, IGM

## 2011-03-23 ENCOUNTER — Inpatient Hospital Stay: Payer: BC Managed Care – PPO | Admitting: Pulmonary Disease

## 2011-04-13 LAB — CULTURE, RESPIRATORY W GRAM STAIN

## 2011-08-14 ENCOUNTER — Ambulatory Visit: Payer: Self-pay | Admitting: Urology

## 2012-05-10 ENCOUNTER — Encounter (HOSPITAL_COMMUNITY): Admission: RE | Admit: 2012-05-10 | Payer: BC Managed Care – PPO | Source: Ambulatory Visit

## 2012-05-16 ENCOUNTER — Ambulatory Visit (HOSPITAL_COMMUNITY): Admission: RE | Admit: 2012-05-16 | Payer: BC Managed Care – PPO | Source: Ambulatory Visit | Admitting: Ophthalmology

## 2012-05-16 ENCOUNTER — Encounter (HOSPITAL_COMMUNITY): Admission: RE | Payer: Self-pay | Source: Ambulatory Visit

## 2012-05-16 SURGERY — PHACOEMULSIFICATION, CATARACT, WITH IOL INSERTION
Anesthesia: Monitor Anesthesia Care | Site: Eye | Laterality: Right

## 2012-08-16 ENCOUNTER — Telehealth: Payer: Self-pay | Admitting: *Deleted

## 2012-08-16 NOTE — Telephone Encounter (Signed)
OFFERED PATIENT APPT CHANGED HIS MIND AND DIDN'T WONT TO BE SEEN

## 2012-08-17 ENCOUNTER — Encounter: Payer: Self-pay | Admitting: Nurse Practitioner

## 2012-08-17 ENCOUNTER — Ambulatory Visit (INDEPENDENT_AMBULATORY_CARE_PROVIDER_SITE_OTHER): Payer: BC Managed Care – PPO | Admitting: Nurse Practitioner

## 2012-08-17 VITALS — BP 130/69 | HR 77 | Temp 97.9°F | Ht 69.0 in | Wt 160.0 lb

## 2012-08-17 DIAGNOSIS — W57XXXA Bitten or stung by nonvenomous insect and other nonvenomous arthropods, initial encounter: Secondary | ICD-10-CM

## 2012-08-17 DIAGNOSIS — T148 Other injury of unspecified body region: Secondary | ICD-10-CM

## 2012-08-17 MED ORDER — DOXYCYCLINE HYCLATE 100 MG PO TABS: 100.0000 mg | ORAL_TABLET | Freq: Two times a day (BID) | ORAL | Status: DC

## 2012-08-17 NOTE — Patient Instructions (Signed)
Lyme Disease  You may have been bitten by a tick and are to watch for the development of Lyme Disease. Lyme Disease is an infection that is caused by a bacteria The bacteria causing this disease is named Borreilia burgdorferi. If a tick is infected with this bacteria and then bites you, then Lyme Disease may occur. These ticks are carried by deer and rodents such as rabbits and mice and infest grassy as well as forested areas. Fortunately most tick bites do not cause Lyme Disease.   Lyme Disease is easier to prevent than to treat. First, covering your legs with clothing when walking in areas where ticks are possibly abundant will prevent their attachment because ticks tend to stay within inches of the ground. Second, using insecticides containing DEET can be applied on skin or clothing. Last, because it takes about 12 to 24 hours for the tick to transmit the disease after attachment to the human host, you should inspect your body for ticks twice a day when you are in areas where Lyme Disease is common. You must look thoroughly when searching for ticks. The Ixodes tick that carries Lyme Disease is very small. It is around the size of a sesame seed (picture of tick is not actual size). Removal is best done by grasping the tick by the head and pulling it out. Do not to squeeze the body of the tick. This could inject the infecting bacteria into the bite site. Wash the area of the bite with an antiseptic solution after removal.   Lyme Disease is a disease that may affect many body systems. Because of the small size of the biting tick, most people do not notice being bitten. The first sign of an infection is usually a round red rash that extends out from the center of the tick bite. The center of the lesion may be blood colored (hemorrhagic) or have tiny blisters (vesicular). Most lesions have bright red outer borders and partial central clearing. This rash may extend out many inches in diameter, and multiple lesions may  be present. Other symptoms such as fatigue, headaches, chills and fever, general achiness and swelling of lymph glands may also occur. If this first stage of the disease is left untreated, these symptoms may gradually resolve by themselves, or progressive symptoms may occur because of spread of infection to other areas of the body.   Follow up with your caregiver to have testing and treatment if you have a tick bite and you develop any of the above complaints. Your caregiver may recommend preventative (prophylactic) medications which kill bacteria (antibiotics). Once a diagnosis of Lyme Disease is made, antibiotic treatment is highly likely to cure the disease. Effective treatment of late stage Lyme Disease may require longer courses of antibiotic therapy.   MAKE SURE YOU:   · Understand these instructions.  · Will watch your condition.  · Will get help right away if you are not doing well or get worse.  Document Released: 06/01/2000 Document Revised: 05/18/2011 Document Reviewed: 08/03/2008  [employer]® Patient Information ©2014 [employer].

## 2012-08-17 NOTE — Progress Notes (Signed)
  Subjective:    Patient ID: Thomas Sandoval, male    DOB: 01/24/1956, 58 y.o.   MRN: [id number]  HPI patient in c/o tick bite- doesn't remember removing a tick in area of question- Has a red place on right thigh- says he gets ticks off of hisself all the time- Has been there for 3 days    Review of Systems  Constitutional: Positive for fatigue (but normal for him).  Cardiovascular: Negative.   Skin: Positive for rash (right thigh).       Objective:   Physical Exam  Constitutional: He is oriented to person, place, and time. He appears well-developed and well-nourished.  Cardiovascular: Normal rate and normal heart sounds.   Pulmonary/Chest: Effort normal and breath sounds normal.  Neurological: He is alert and oriented to person, place, and time.  Skin:  2cm annular flat lesion with central red spot-right lower inner thigh  Psychiatric: He has a normal mood and affect. His behavior is normal. Judgment and thought content normal.  BP 130/69  Pulse 77  Temp(Src) 97.9 F (36.6 C) (Oral)  Ht 5\' 9"  (1.753 m)  Wt 160 lb (72.576 kg)  BMI 23.62 kg/m2         Assessment & Plan:  1. Tick bite Clean with peroxide BID Avoid scratching or rubbing - B. burgdorfi antibodies - Rocky mtn spotted fvr abs pnl(IgG+IgM) - doxycycline (VIBRA-TABS) 100 MG tablet; Take 1 tablet (100 mg total) by mouth 2 (two) times daily.  Dispense: 28 tablet; Refill: 0  Make an appointment for routine follow-up [name] Daphine Deutscher, FNP

## 2012-08-18 LAB — B. BURGDORFI ANTIBODIES: B burgdorferi Ab IgG+IgM: 0.35 {ISR}

## 2012-08-30 ENCOUNTER — Other Ambulatory Visit: Payer: Self-pay | Admitting: Family Medicine

## 2012-09-01 NOTE — Telephone Encounter (Signed)
Last A1C 10/13

## 2012-10-22 ENCOUNTER — Other Ambulatory Visit: Payer: Self-pay | Admitting: Nurse Practitioner

## 2012-10-27 ENCOUNTER — Other Ambulatory Visit: Payer: Self-pay | Admitting: Nurse Practitioner

## 2012-10-28 NOTE — Telephone Encounter (Signed)
Last seen 08/17/12 MMM  Last glucose level 12/18/11

## 2012-11-07 ENCOUNTER — Other Ambulatory Visit: Payer: Self-pay | Admitting: Nurse Practitioner

## 2012-11-10 ENCOUNTER — Telehealth: Payer: Self-pay | Admitting: Nurse Practitioner

## 2012-12-20 ENCOUNTER — Other Ambulatory Visit: Payer: Self-pay | Admitting: Nurse Practitioner

## 2012-12-22 NOTE — Telephone Encounter (Signed)
LAST AIC 6.6 ON 10/13. LAST OV 6/14 FOR TICK BITE. NTBS.

## 2012-12-26 NOTE — Telephone Encounter (Signed)
NTBS- NO MORE REFILLS without appointment

## 2012-12-26 NOTE — Telephone Encounter (Signed)
Last refill called in for lantus states that patient NTBS.  Because I don't not normally see this patient for diabetes care this refill request needs to be reviewed by his regular medical provider.

## 2013-01-12 ENCOUNTER — Other Ambulatory Visit: Payer: Self-pay

## 2013-02-01 ENCOUNTER — Ambulatory Visit (INDEPENDENT_AMBULATORY_CARE_PROVIDER_SITE_OTHER): Payer: BC Managed Care – PPO | Admitting: Family Medicine

## 2013-02-01 ENCOUNTER — Ambulatory Visit (INDEPENDENT_AMBULATORY_CARE_PROVIDER_SITE_OTHER): Payer: BC Managed Care – PPO

## 2013-02-01 ENCOUNTER — Telehealth: Payer: Self-pay | Admitting: Nurse Practitioner

## 2013-02-01 VITALS — BP 155/63 | HR 64 | Temp 97.3°F | Ht 69.0 in | Wt 164.6 lb

## 2013-02-01 DIAGNOSIS — R059 Cough, unspecified: Secondary | ICD-10-CM

## 2013-02-01 DIAGNOSIS — J209 Acute bronchitis, unspecified: Secondary | ICD-10-CM | POA: Insufficient documentation

## 2013-02-01 DIAGNOSIS — R05 Cough: Secondary | ICD-10-CM

## 2013-02-01 DIAGNOSIS — J449 Chronic obstructive pulmonary disease, unspecified: Secondary | ICD-10-CM

## 2013-02-01 DIAGNOSIS — E119 Type 2 diabetes mellitus without complications: Secondary | ICD-10-CM

## 2013-02-01 MED ORDER — SULFAMETHOXAZOLE-TMP DS 800-160 MG PO TABS: 1.0000 | ORAL_TABLET | Freq: Two times a day (BID) | ORAL | Status: DC

## 2013-02-01 MED ORDER — ROFLUMILAST 500 MCG PO TABS: 500.0000 ug | ORAL_TABLET | Freq: Every day | ORAL | Status: DC

## 2013-02-01 MED ORDER — ACLIDINIUM BROMIDE 400 MCG/ACT IN AEPB: 1.0000 | INHALATION_SPRAY | Freq: Two times a day (BID) | RESPIRATORY_TRACT | Status: DC

## 2013-02-01 NOTE — Telephone Encounter (Signed)
Pt came in for appt. 

## 2013-02-01 NOTE — Patient Instructions (Signed)
Smoking Cessation Quitting smoking is important to your health and has many advantages. However, it is not always easy to quit since nicotine is a very addictive drug. Often times, people try 3 times or more before being able to quit. This document explains the best ways for you to prepare to quit smoking. Quitting takes hard work and a lot of effort, but you can do it. ADVANTAGES OF QUITTING SMOKING  You will live longer, feel better, and live better.  Your body will feel the impact of quitting smoking almost immediately.  Within 20 minutes, blood pressure decreases. Your pulse returns to its normal level.  After 8 hours, carbon monoxide levels in the blood return to normal. Your oxygen level increases.  After 24 hours, the chance of having a heart attack starts to decrease. Your breath, hair, and body stop smelling like smoke.  After 48 hours, damaged nerve endings begin to recover. Your sense of taste and smell improve.  After 72 hours, the body is virtually free of nicotine. Your bronchial tubes relax and breathing becomes easier.  After 2 to 12 weeks, lungs can hold more air. Exercise becomes easier and circulation improves.  The risk of having a heart attack, stroke, cancer, or lung disease is greatly reduced.  After 1 year, the risk of coronary heart disease is cut in half.  After 5 years, the risk of stroke falls to the same as a nonsmoker.  After 10 years, the risk of lung cancer is cut in half and the risk of other cancers decreases significantly.  After 15 years, the risk of coronary heart disease drops, usually to the level of a nonsmoker.  If you are pregnant, quitting smoking will improve your chances of having a healthy baby.  The people you live with, especially any children, will be healthier.  You will have extra money to spend on things other than cigarettes. QUESTIONS TO THINK ABOUT BEFORE ATTEMPTING TO QUIT You may want to talk about your answers with your  caregiver.  Why do you want to quit?  If you tried to quit in the past, what helped and what did not?  What will be the most difficult situations for you after you quit? How will you plan to handle them?  Who can help you through the tough times? Your family? Friends? A caregiver?  What pleasures do you get from smoking? What ways can you still get pleasure if you quit? Here are some questions to ask your caregiver:  How can you help me to be successful at quitting?  What medicine do you think would be best for me and how should I take it?  What should I do if I need more help?  What is smoking withdrawal like? How can I get information on withdrawal? GET READY  Set a quit date.  Change your environment by getting rid of all cigarettes, ashtrays, matches, and lighters in your home, car, or work. Do not let people smoke in your home.  Review your past attempts to quit. Think about what worked and what did not. GET SUPPORT AND ENCOURAGEMENT You have a better chance of being successful if you have help. You can get support in many ways.  Tell your family, friends, and co-workers that you are going to quit and need their support. Ask them not to smoke around you.  Get individual, group, or telephone counseling and support. Programs are available at local hospitals and health centers. Call your local health department for   information about programs in your area.  Spiritual beliefs and practices may help some smokers quit.  Download a "quit meter" on your computer to keep track of quit statistics, such as how long you have gone without smoking, cigarettes not smoked, and money saved.  Get a self-help book about quitting smoking and staying off of tobacco. LEARN NEW SKILLS AND BEHAVIORS  Distract yourself from urges to smoke. Talk to someone, go for a walk, or occupy your time with a task.  Change your normal routine. Take a different route to work. Drink tea instead of coffee.  Eat breakfast in a different place.  Reduce your stress. Take a hot bath, exercise, or read a book.  Plan something enjoyable to do every day. Reward yourself for not smoking.  Explore interactive web-based programs that specialize in helping you quit. GET MEDICINE AND USE IT CORRECTLY Medicines can help you stop smoking and decrease the urge to smoke. Combining medicine with the above behavioral methods and support can greatly increase your chances of successfully quitting smoking.  Nicotine replacement therapy helps deliver nicotine to your body without the negative effects and risks of smoking. Nicotine replacement therapy includes nicotine gum, lozenges, inhalers, nasal sprays, and skin patches. Some may be available over-the-counter and others require a prescription.  Antidepressant medicine helps people abstain from smoking, but how this works is unknown. This medicine is available by prescription.  Nicotinic receptor partial agonist medicine simulates the effect of nicotine in your brain. This medicine is available by prescription. Ask your caregiver for advice about which medicines to use and how to use them based on your health history. Your caregiver will tell you what side effects to look out for if you choose to be on a medicine or therapy. Carefully read the information on the package. Do not use any other product containing nicotine while using a nicotine replacement product.  RELAPSE OR DIFFICULT SITUATIONS Most relapses occur within the first 3 months after quitting. Do not be discouraged if you start smoking again. Remember, most people try several times before finally quitting. You may have symptoms of withdrawal because your body is used to nicotine. You may crave cigarettes, be irritable, feel very hungry, cough often, get headaches, or have difficulty concentrating. The withdrawal symptoms are only temporary. They are strongest when you first quit, but they will go away within  10 14 days. To reduce the chances of relapse, try to:  Avoid drinking alcohol. Drinking lowers your chances of successfully quitting.  Reduce the amount of caffeine you consume. Once you quit smoking, the amount of caffeine in your body increases and can give you symptoms, such as a rapid heartbeat, sweating, and anxiety.  Avoid smokers because they can make you want to smoke.  Do not let weight gain distract you. Many smokers will gain weight when they quit, usually less than 10 pounds. Eat a healthy diet and stay active. You can always lose the weight gained after you quit.  Find ways to improve your mood other than smoking. FOR MORE INFORMATION  www.smokefree.gov  Document Released: 02/17/2001 Document Revised: 08/25/2011 Document Reviewed: 06/04/2011 [employer] Patient Information 2014 [employer].        Dr Leilany Digeronimo's Recommendations  For nutrition information, I recommend books:  1).Eat to Live by Dr Joel Fuhrman. 2).Prevent and Reverse Heart Disease by Dr Caldwell Esselstyn. 3) Dr Neal Barnard's Book:  Program to Reverse Diabetes  Exercise recommendations are:  If unable to walk, then the patient can   exercise in a chair 3 times a day. By flapping arms like a bird gently and raising legs outwards to the front.  If ambulatory, the patient can go for walks for 30 minutes 3 times a week. Then increase the intensity and duration as tolerated.  Goal is to try to attain exercise frequency to 5 times a week.  If applicable: Best to perform resistance exercises (machines or weights) 2 days a week and cardio type exercises 3 days per week.  

## 2013-02-01 NOTE — Progress Notes (Signed)
SUBJECTIVE: CC: Chief Complaint  Patient presents with  . Cough    denies fever, x 1 week  . Nasal Congestion    HPI:  Coughing and wheezes. CBG 180 this am. Adjusted insulin.no chest pain. No hemoptysis. H/o COPD  Patient has DM on insulin.sugars now fluctuating 180 to 200.  Past Medical History  Diagnosis Date  . Diabetes mellitus   . Hemolysis 02/23/2011  . Blood transfusion   . GERD (gastroesophageal reflux disease)   . Headache(784.0)   . Pneumonia   . Anemia    Past Surgical History  Procedure Laterality Date  . Tonsillectomy     History   Social History  . Marital Status: Married    Spouse Name: N/A    Number of Children: N/A  . Years of Education: N/A   Occupational History  . Not on file.   Social History Main Topics  . Smoking status: Current Every Day Smoker -- 2.00 packs/day    Types: Cigarettes  . Smokeless tobacco: Not on file  . Alcohol Use: No  . Drug Use: No  . Sexual Activity: Yes   Other Topics Concern  . Not on file   Social History Narrative  . No narrative on file   Family History  Problem Relation Age of Onset  . Breast cancer Mother   . Rheumatic fever Father    Current Outpatient Prescriptions on File Prior to Visit  Medication Sig Dispense Refill  . ADVAIR DISKUS 250-50 MCG/DOSE AEPB USE ONE PUFF BY MOUTH TWICE DAILY  60 each  3  . famotidine (PEPCID) 20 MG tablet Take 20 mg by mouth at bedtime as needed. For indigestion       . ibuprofen (ADVIL,MOTRIN) 200 MG tablet Take 400 mg by mouth every 6 (six) hours as needed. For pain        . insulin aspart (NOVOLOG) 100 UNIT/ML injection Inject into the skin 3 (three) times daily with meals.      Marland Kitchen LANTUS 100 UNIT/ML injection USE 4O UNITS IN MORNING  20 mL  0  . loratadine (CLARITIN) 10 MG tablet Take 10 mg by mouth at bedtime.        Marland Kitchen albuterol (PROVENTIL HFA;VENTOLIN HFA) 108 (90 BASE) MCG/ACT inhaler Inhale 2 puffs into the lungs every 4 (four) hours as needed for wheezing.   1 Inhaler  3   No current facility-administered medications on file prior to visit.   Allergies  Allergen Reactions  . Zithromax [Azithromycin] Other (See Comments)    Severe stomach cramps; told to list as an allergy by dr. Years ago  . Avelox [Moxifloxacin Hcl In Nacl]     Hemolysis    There is no immunization history on file for this patient. Prior to Admission medications   Medication Sig Start Date End Date Taking? Authorizing Provider  ADVAIR DISKUS 250-50 MCG/DOSE AEPB USE ONE PUFF BY MOUTH TWICE DAILY 10/22/12  Yes [name] Daphine Deutscher, FNP  famotidine (PEPCID) 20 MG tablet Take 20 mg by mouth at bedtime as needed. For indigestion    Yes Historical Provider, MD  ibuprofen (ADVIL,MOTRIN) 200 MG tablet Take 400 mg by mouth every 6 (six) hours as needed. For pain     Yes Historical Provider, MD  insulin aspart (NOVOLOG) 100 UNIT/ML injection Inject into the skin 3 (three) times daily with meals.   Yes Historical Provider, MD  LANTUS 100 UNIT/ML injection USE 4O UNITS IN MORNING 12/20/12  Yes [name] Daphine Deutscher, FNP  loratadine (CLARITIN) 10  MG tablet Take 10 mg by mouth at bedtime.     Yes Historical Provider, MD  albuterol (PROVENTIL HFA;VENTOLIN HFA) 108 (90 BASE) MCG/ACT inhaler Inhale 2 puffs into the lungs every 4 (four) hours as needed for wheezing. 02/15/11 08/17/12  Suzi Roots, MD     ROS: As above in the HPI. All other systems are stable or negative.  OBJECTIVE: APPEARANCE:  Patient in no acute distress.The patient appeared well nourished and normally developed. Acyanotic. Waist: VITAL SIGNS:BP 155/63  Pulse 64  Temp(Src) 97.3 F (36.3 C) (Oral)  Ht 5\' 9"  (1.753 m)  Wt 164 lb 9.6 oz (74.662 kg)  BMI 24.30 kg/m2 WM  SKIN: warm and  Dry without overt rashes, tattoos and scars  HEAD and Neck: without JVD, Head and scalp: normal Eyes:No scleral icterus. Fundi normal, eye movements normal. Ears: Auricle normal, canal normal, Tympanic membranes normal,  insufflation normal. Nose: normal Throat: normal Neck & thyroid: normal  CHEST & LUNGS: Chest wall: normal Lungs: Coarse bs, wheezes, Rhonchi bilaterally   CVS: Reveals the PMI to be normally located. Regular rhythm, First and Second Heart sounds are normal,  absence of murmurs, rubs or gallops. Peripheral vasculature: Radial pulses: normal Dorsal pedis pulses: normal Posterior pulses: normal  ABDOMEN:  Appearance: normal Benign, no organomegaly, no masses, no Abdominal Aortic enlargement. No Guarding , no rebound. No Bruits. Bowel sounds: normal  RECTAL: N/A GU: N/A  EXTREMETIES: nonedematous.  MUSCULOSKELETAL:  Spine: normal Joints: intact  NEUROLOGIC: oriented to time,place and person; nonfocal. Strength is normal Hard of hearing  Reflexes are normal Cranial Nerves are normal.  ASSESSMENT:  Cough - Plan: DG Chest 2 View, sulfamethoxazole-trimethoprim (BACTRIM DS) 800-160 MG per tablet  COPD (chronic obstructive pulmonary disease) - Plan: Aclidinium Bromide 400 MCG/ACT AEPB, roflumilast (DALIRESP) 500 MCG TABS tablet  DM (diabetes mellitus)  Acute bronchitis Exacerbation of COPD   PLAN: increase the advair to twice a day. Patient has been using it once daily.  Orders Placed This Encounter  Procedures  . DG Chest 2 View    Order Specific Question:  Reason for Exam (SYMPTOM  OR DIAGNOSIS REQUIRED)    Answer:  persistent cough    Order Specific Question:  Preferred imaging location?    Answer:  Internal   Meds ordered this encounter  Medications  . sulfamethoxazole-trimethoprim (BACTRIM DS) 800-160 MG per tablet    Sig: Take 1 tablet by mouth 2 (two) times daily.    Dispense:  20 tablet    Refill:  0  . Aclidinium Bromide 400 MCG/ACT AEPB    Sig: Inhale 1 puff into the lungs 2 (two) times daily.    Dispense:  2 each    Refill:  0  . roflumilast (DALIRESP) 500 MCG TABS tablet    Sig: Take 1 tablet (500 mcg total) by mouth daily.    Dispense:   14 tablet    Refill:  0   Medications Discontinued During This Encounter  Medication Reason  . doxycycline (VIBRA-TABS) 100 MG tablet Error  [facility] reading (PRIMARY) by  Dr. Modesto Charon : hyperinflation. Copd. Bronchitis..                               Return in about 2 weeks (around 02/15/2013) for Recheck medical problems.  Ilya Neely P. [place]esto Charon, M.D.

## 2013-02-04 ENCOUNTER — Inpatient Hospital Stay (HOSPITAL_COMMUNITY)
Admission: EM | Admit: 2013-02-04 | Discharge: 2013-02-06 | DRG: 392 | Disposition: A | Discharge status: Home / Self Care | Payer: BC Managed Care – PPO | Attending: Internal Medicine | Admitting: Internal Medicine

## 2013-02-04 ENCOUNTER — Emergency Department (HOSPITAL_COMMUNITY): Payer: BC Managed Care – PPO

## 2013-02-04 ENCOUNTER — Encounter (HOSPITAL_COMMUNITY): Payer: Self-pay | Admitting: Emergency Medicine

## 2013-02-04 DIAGNOSIS — K219 Gastro-esophageal reflux disease without esophagitis: Secondary | ICD-10-CM

## 2013-02-04 DIAGNOSIS — E119 Type 2 diabetes mellitus without complications: Secondary | ICD-10-CM | POA: Diagnosis present

## 2013-02-04 DIAGNOSIS — J441 Chronic obstructive pulmonary disease with (acute) exacerbation: Secondary | ICD-10-CM | POA: Diagnosis present

## 2013-02-04 DIAGNOSIS — R1110 Vomiting, unspecified: Secondary | ICD-10-CM | POA: Diagnosis present

## 2013-02-04 DIAGNOSIS — K2960 Other gastritis without bleeding: Secondary | ICD-10-CM

## 2013-02-04 DIAGNOSIS — R1115 Cyclical vomiting syndrome unrelated to migraine: Secondary | ICD-10-CM | POA: Diagnosis present

## 2013-02-04 DIAGNOSIS — R059 Cough, unspecified: Secondary | ICD-10-CM

## 2013-02-04 DIAGNOSIS — J154 Pneumonia due to other streptococci: Secondary | ICD-10-CM

## 2013-02-04 DIAGNOSIS — K2970 Gastritis, unspecified, without bleeding: Principal | ICD-10-CM

## 2013-02-04 DIAGNOSIS — J069 Acute upper respiratory infection, unspecified: Secondary | ICD-10-CM

## 2013-02-04 DIAGNOSIS — K920 Hematemesis: Secondary | ICD-10-CM | POA: Diagnosis present

## 2013-02-04 DIAGNOSIS — J449 Chronic obstructive pulmonary disease, unspecified: Secondary | ICD-10-CM | POA: Diagnosis present

## 2013-02-04 DIAGNOSIS — R0902 Hypoxemia: Secondary | ICD-10-CM

## 2013-02-04 DIAGNOSIS — D649 Anemia, unspecified: Secondary | ICD-10-CM

## 2013-02-04 DIAGNOSIS — J4489 Other specified chronic obstructive pulmonary disease: Secondary | ICD-10-CM | POA: Diagnosis present

## 2013-02-04 DIAGNOSIS — J209 Acute bronchitis, unspecified: Secondary | ICD-10-CM

## 2013-02-04 DIAGNOSIS — I4892 Unspecified atrial flutter: Secondary | ICD-10-CM

## 2013-02-04 DIAGNOSIS — R05 Cough: Secondary | ICD-10-CM

## 2013-02-04 DIAGNOSIS — IMO0002 Reserved for concepts with insufficient information to code with codable children: Secondary | ICD-10-CM

## 2013-02-04 DIAGNOSIS — F17200 Nicotine dependence, unspecified, uncomplicated: Secondary | ICD-10-CM | POA: Diagnosis present

## 2013-02-04 DIAGNOSIS — R195 Other fecal abnormalities: Secondary | ICD-10-CM | POA: Diagnosis present

## 2013-02-04 DIAGNOSIS — E1169 Type 2 diabetes mellitus with other specified complication: Secondary | ICD-10-CM | POA: Diagnosis not present

## 2013-02-04 LAB — OCCULT BLOOD X 1 CARD TO LAB, STOOL: Fecal Occult Bld: POSITIVE — AB

## 2013-02-04 LAB — CBC WITH DIFFERENTIAL/PLATELET
Basophils Absolute: 0 10*3/uL (ref 0.0–0.1)
Basophils Relative: 0 % (ref 0–1)
Eosinophils Relative: 0 % (ref 0–5)
HCT: 44.5 % (ref 39.0–52.0)
Hemoglobin: 15.5 g/dL (ref 13.0–17.0)
Lymphs Abs: 0.9 10*3/uL (ref 0.7–4.0)
MCH: 31.8 pg (ref 26.0–34.0)
MCHC: 34.8 g/dL (ref 30.0–36.0)
MCV: 91.2 fL (ref 78.0–100.0)
Monocytes Absolute: 0.8 10*3/uL (ref 0.1–1.0)
Monocytes Relative: 7 % (ref 3–12)
Neutro Abs: 9.8 10*3/uL — ABNORMAL HIGH (ref 1.7–7.7)
RDW: 12.9 % (ref 11.5–15.5)

## 2013-02-04 LAB — TYPE AND SCREEN
ABO/RH(D): A POS
Antibody Screen: NEGATIVE

## 2013-02-04 LAB — CG4 I-STAT (LACTIC ACID): Lactic Acid, Venous: 1.18 mmol/L (ref 0.5–2.2)

## 2013-02-04 LAB — URINALYSIS, ROUTINE W REFLEX MICROSCOPIC
Bilirubin Urine: NEGATIVE
Ketones, ur: >80 mg/dL — AB
Leukocytes, UA: NEGATIVE
Nitrite: NEGATIVE
Protein, ur: NEGATIVE mg/dL
Urobilinogen, UA: 0.2 mg/dL (ref 0.0–1.0)

## 2013-02-04 LAB — COMPREHENSIVE METABOLIC PANEL
Albumin: 4.6 g/dL (ref 3.5–5.2)
Alkaline Phosphatase: 80 U/L (ref 39–117)
BUN: 14 mg/dL (ref 6–23)
Chloride: 99 mEq/L (ref 96–112)
Creatinine, Ser: 1.01 mg/dL (ref 0.50–1.35)
GFR calc Af Amer: >90 mL/min (ref >90)
GFR calc non Af Amer: 82 mL/min — ABNORMAL LOW (ref >90)
Glucose, Bld: 229 mg/dL — ABNORMAL HIGH (ref 70–99)
Total Bilirubin: 1.1 mg/dL (ref 0.3–1.2)

## 2013-02-04 LAB — URINE MICROSCOPIC-ADD ON

## 2013-02-04 LAB — GLUCOSE, CAPILLARY: Glucose-Capillary: 213 mg/dL — ABNORMAL HIGH (ref 70–99)

## 2013-02-04 LAB — POCT I-STAT TROPONIN I: Troponin i, poc: 0 ng/mL (ref 0.00–0.08)

## 2013-02-04 LAB — OCCULT BLOOD, POC DEVICE: Fecal Occult Bld: POSITIVE — AB

## 2013-02-04 MED ORDER — PANTOPRAZOLE SODIUM 40 MG IV SOLR
40.0000 mg | Freq: Once | INTRAVENOUS | Status: AC
  Administered 2013-02-04: 40 mg via INTRAVENOUS
  Filled 2013-02-04: qty 40

## 2013-02-04 MED ORDER — ONDANSETRON HCL 4 MG/2ML IJ SOLN
4.0000 mg | Freq: Once | INTRAMUSCULAR | Status: AC
  Administered 2013-02-04: 4 mg via INTRAVENOUS
  Filled 2013-02-04: qty 2

## 2013-02-04 MED ORDER — MOMETASONE FURO-FORMOTEROL FUM 100-5 MCG/ACT IN AERO
2.0000 | INHALATION_SPRAY | Freq: Two times a day (BID) | RESPIRATORY_TRACT | Status: DC
  Administered 2013-02-05 – 2013-02-06 (×3): 2 via RESPIRATORY_TRACT
  Filled 2013-02-04: qty 8.8

## 2013-02-04 MED ORDER — TIOTROPIUM BROMIDE MONOHYDRATE 18 MCG IN CAPS
18.0000 ug | ORAL_CAPSULE | Freq: Every day | RESPIRATORY_TRACT | Status: DC
  Administered 2013-02-05 – 2013-02-06 (×2): 18 ug via RESPIRATORY_TRACT
  Filled 2013-02-04: qty 5

## 2013-02-04 MED ORDER — ACETAMINOPHEN 650 MG RE SUPP: 650.0000 mg | Freq: Four times a day (QID) | RECTAL | Status: DC | PRN

## 2013-02-04 MED ORDER — SODIUM CHLORIDE 0.9 % IV BOLUS (SEPSIS)
1000.0000 mL | Freq: Once | INTRAVENOUS | Status: AC
  Administered 2013-02-04: 1000 mL via INTRAVENOUS

## 2013-02-04 MED ORDER — PANTOPRAZOLE SODIUM 40 MG IV SOLR: 40.0000 mg | Freq: Once | INTRAVENOUS | Status: DC

## 2013-02-04 MED ORDER — SODIUM CHLORIDE 0.9 % IV SOLN
INTRAVENOUS | Status: DC
  Administered 2013-02-04 – 2013-02-05 (×2): via INTRAVENOUS

## 2013-02-04 MED ORDER — INSULIN ASPART 100 UNIT/ML ~~LOC~~ SOLN
0.0000 [IU] | SUBCUTANEOUS | Status: DC
Start: 2013-02-04 — End: 2013-02-05
  Administered 2013-02-04: 3 [IU] via SUBCUTANEOUS
  Administered 2013-02-05: 2 [IU] via SUBCUTANEOUS

## 2013-02-04 MED ORDER — ACETAMINOPHEN 325 MG PO TABS: 650.0000 mg | ORAL_TABLET | Freq: Four times a day (QID) | ORAL | Status: DC | PRN

## 2013-02-04 MED ORDER — HYDROMORPHONE HCL PF 1 MG/ML IJ SOLN
0.5000 mg | INTRAMUSCULAR | Status: DC | PRN
  Filled 2013-02-04: qty 1

## 2013-02-04 MED ORDER — ALBUTEROL SULFATE (5 MG/ML) 0.5% IN NEBU: 2.5000 mg | INHALATION_SOLUTION | RESPIRATORY_TRACT | Status: DC | PRN

## 2013-02-04 MED ORDER — PROMETHAZINE HCL 25 MG/ML IJ SOLN: 25.0000 mg | Freq: Four times a day (QID) | INTRAMUSCULAR | Status: DC | PRN

## 2013-02-04 MED ORDER — PANTOPRAZOLE SODIUM 40 MG IV SOLR
40.0000 mg | Freq: Two times a day (BID) | INTRAVENOUS | Status: DC
  Administered 2013-02-05 – 2013-02-06 (×3): 40 mg via INTRAVENOUS
  Filled 2013-02-04 (×4): qty 40

## 2013-02-04 MED ORDER — INSULIN GLARGINE 100 UNIT/ML ~~LOC~~ SOLN
10.0000 [IU] | Freq: Every day | SUBCUTANEOUS | Status: DC
  Filled 2013-02-04: qty 0.1

## 2013-02-04 MED ORDER — FENTANYL CITRATE 0.05 MG/ML IJ SOLN
50.0000 ug | Freq: Once | INTRAMUSCULAR | Status: AC
  Administered 2013-02-04: 50 ug via INTRAVENOUS
  Filled 2013-02-04: qty 2

## 2013-02-04 NOTE — ED Notes (Signed)
MD at bedside. 

## 2013-02-04 NOTE — Progress Notes (Signed)
NURSING PROGRESS NOTE  Thomas Sandoval [id number] Admission Data: 02/04/2013 7:42 PM Attending Provider: Christiane Ha, MD Sandoval:[id number], DONALD, MD Code Status: full   Thomas Sandoval is a 57 y.o. male patient admitted from ED:  -No acute distress noted.  -No complaints of shortness of breath.  -No complaints of chest pain.    Blood pressure 180/71, pulse 86, temperature 98.1 F (36.7 C), temperature source Oral, resp. rate 18, height 5\' 10"  (1.778 m), weight 71.9 kg (158 lb 8.2 oz), SpO2 98.00%.   IV Fluids:  IV in place, occlusive dsg intact without redness, IV cath wrist left, condition patent and no redness normal saline.   Allergies:  Zithromax and Avelox  Past Medical History:   has a past medical history of Diabetes mellitus; Hemolysis (02/23/2011); Blood transfusion; GERD (gastroesophageal reflux disease); Headache(784.0); Pneumonia; and Anemia.  Past Surgical History:   has past surgical history that includes Tonsillectomy.  Social History:   reports that he has been smoking Cigarettes.  He has been smoking about 2.00 packs per day. He does not have any smokeless tobacco history on file. He reports that he does not drink alcohol or use illicit drugs.  Skin: intact  Patient/Family orientated to room. Information packet given to patient/family. Admission inpatient armband information verified with patient/family to include name and date of birth and placed on patient arm. Side rails up x 2, fall assessment and education completed with patient/family. Patient/family able to verbalize understanding of risk associated with falls and verbalized understanding to call for assistance before getting out of bed. Call light within reach. Patient/family able to voice and demonstrate understanding of unit orientation instructions.    Will continue to evaluate and treat per MD orders.   Madelin Rear, MSN, RN, [employer]

## 2013-02-04 NOTE — H&P (Signed)
[facility] History and Physical  Thomas Sandoval CZY:[id number] DOB: 01/23/1956 DOA: 02/04/2013  Referring physician: EDP PCP: Rudi Heap, MD  Specialists: Russella Dar  Chief Complaint: vomiting  HPI: Thomas Sandoval is a 57 y.o. male presents with intractable vomiting.  Initially nonbloody, started becoming darker.  In ED, had coffee ground emesis, gastroccult positive.  No F/C/diarrhea.  Rectal exam by EDP showed trace heme positive stool.  No abdominal pain.  Blood pressure and hgb normal.  No h/o GIB in the past.  Takes ibuprofen several times daily.  Started on bactrim and daliresp for URI 3 days ago.  Dr. Russella Dar consulted by EDP.  He will consult in am.  Review of Systems: systems reviewed. As above, otherwise negative  Past Medical History  Diagnosis Date  . Diabetes mellitus   . Hemolysis 02/23/2011  . Blood transfusion   . GERD (gastroesophageal reflux disease)   . Headache(784.0)   . Pneumonia   . Anemia    Past Surgical History  Procedure Laterality Date  . Tonsillectomy     Social History:  reports that he has been smoking Cigarettes.  He has been smoking about 2.00 packs per day. He does not have any smokeless tobacco history on file. He reports that he does not drink alcohol or use illicit drugs.  Allergies  Allergen Reactions  . Zithromax [Azithromycin] Other (See Comments)    Severe stomach cramps; told to list as an allergy by dr. Years ago  . Avelox [Moxifloxacin Hcl In Nacl]     Hemolysis    Family History  Problem Relation Age of Onset  . Breast cancer Mother   . Rheumatic fever Father    Prior to Admission medications   Medication Sig Start Date End Date Taking? Authorizing Provider  Aclidinium Bromide 400 MCG/ACT AEPB Inhale 1 puff into the lungs 2 (two) times daily. 02/01/13  Yes Ileana Ladd, MD  famotidine (PEPCID) 20 MG tablet Take 20 mg by mouth at bedtime as needed. For indigestion    Yes Historical Provider, MD   Fluticasone-Salmeterol (ADVAIR) 250-50 MCG/DOSE AEPB Inhale 1 puff into the lungs 2 (two) times daily.   Yes Historical Provider, MD  ibuprofen (ADVIL,MOTRIN) 200 MG tablet Take 400 mg by mouth every 6 (six) hours as needed. For pain     Yes Historical Provider, MD  insulin aspart (NOVOLOG) 100 UNIT/ML injection Inject 2-7 Units into the skin daily as needed for high blood sugar. Sliding scale   Yes Historical Provider, MD  insulin glargine (LANTUS) 100 UNIT/ML injection Inject 40 Units into the skin every morning.   Yes Historical Provider, MD  loratadine (CLARITIN) 10 MG tablet Take 10 mg by mouth at bedtime.     Yes Historical Provider, MD  roflumilast (DALIRESP) 500 MCG TABS tablet Take 1 tablet (500 mcg total) by mouth daily. 02/01/13  Yes Ileana Ladd, MD  sulfamethoxazole-trimethoprim (BACTRIM DS) 800-160 MG per tablet Take 1 tablet by mouth 2 (two) times daily. 02/01/13  Yes Ileana Ladd, MD   Physical Exam: Filed Vitals:   02/04/13 1800  BP: 153/71  Pulse: 108  Temp:   Resp:    BP 180/71  Pulse 86  Temp(Src) 98.1 F (36.7 C) (Oral)  Resp 18  Ht 5\' 10"  (1.778 m)  Wt 71.9 kg (158 lb 8.2 oz)  BMI 22.74 kg/m2  SpO2 98%  General Appearance:    Ill appearing. Coughing. Dry heaving  Head:    Normocephalic, without obvious abnormality, atraumatic  Eyes:    PERRL, conjunctiva/corneas clear, EOM's intact, fundi    benign, both eyes          Nose:   Nares normal, septum midline, mucosa normal, no drainage   or sinus tenderness  Throat:   Lips, mucosa, and tongue normal; teeth and gums normal  Neck:   Supple, symmetrical, trachea midline, no adenopathy;       thyroid:  No enlargement/tenderness/nodules; no carotid   bruit or JVD  Back:     Symmetric, no curvature, ROM normal, no CVA tenderness  Lungs:     Clear to auscultation bilaterally, respirations unlabored  Chest wall:    No tenderness or deformity  Heart:    Regular rate and rhythm, S1 and S2 normal, no murmur,  rub   or gallop  Abdomen:     Soft, non-tender, bowel sounds active all four quadrants,    no masses, no organomegaly  Genitalia:   deferred  Rectal:   per EDP, trace heme positive  Extremities:   Extremities normal, atraumatic, no cyanosis or edema  Pulses:   2+ and symmetric all extremities  Skin:   Skin color, texture, turgor normal, no rashes or lesions  Lymph nodes:   Cervical, supraclavicular, and axillary nodes normal  Neurologic:   CNII-XII intact. Normal strength, sensation and reflexes      throughout    Labs on Admission:  Basic Metabolic Panel:  Recent Labs Lab 02/04/13 1420  NA 137  K 4.5  CL 99  CO2 24  GLUCOSE 229*  BUN 14  CREATININE 1.01  CALCIUM 10.2   Liver Function Tests:  Recent Labs Lab 02/04/13 1420  AST 18  ALT 11  ALKPHOS 80  BILITOT 1.1  PROT 7.4  ALBUMIN 4.6   No results found for this basename: LIPASE, AMYLASE,  in the last 168 hours No results found for this basename: AMMONIA,  in the last 168 hours CBC:  Recent Labs Lab 02/04/13 1420  WBC 11.5*  NEUTROABS 9.8*  HGB 15.5  HCT 44.5  MCV 91.2  PLT 260   Cardiac Enzymes: No results found for this basename: CKTOTAL, CKMB, CKMBINDEX, TROPONINI,  in the last 168 hours  BNP (last 3 results) No results found for this basename: PROBNP,  in the last 8760 hours CBG:  Recent Labs Lab 02/04/13 1340  GLUCAP 212*    Radiological Exams on Admission: Dg Chest 2 View  02/04/2013   CLINICAL DATA:  Shortness of breath, weakness, vomiting  EXAM: CHEST  2 VIEW  COMPARISON:  [facility] chest radiographs dated 02/01/2013  FINDINGS: Lungs are clear. No pleural effusion or pneumothorax.  The heart is normal in size.  Visualized osseous structures are within normal limits.  IMPRESSION: No evidence of acute cardiopulmonary disease.   Electronically Signed   By: Charline Bills M.D.   On: 02/04/2013 16:12   Assessment/Plan    Intractable vomiting with coffee ground emesis: serial  H/H, PPI, IVF, antiemetics.  Dr. Russella Dar to see in am.    COPD (chronic obstructive pulmonary disease)    Heme positive stool    DM type 2 (diabetes mellitus, type 2):  SSI and decrease lantus dose   Code Status: full Family Communication: none Disposition Plan: home  Time spent: 60 min  Baila Rouse L [facility] Pager [phone]  If 7PM-7AM, please contact night-coverage [email or web address] Password [facility] 02/04/2013, 6:34 PM

## 2013-02-04 NOTE — ED Provider Notes (Signed)
CSN: [id number]     Arrival date & time 02/04/13  1331 History   First MD Initiated Contact with Patient 02/04/13 1345     Chief Complaint  Patient presents with  . Emesis  . Leg Pain   (Consider location/radiation/quality/duration/timing/severity/associated sxs/prior Treatment) The history is provided by the patient and the spouse. No language interpreter was used.  Thomas Sandoval is a 57 y/o M with PMhx of DM, GERD, anemia, headache presenting to the ED with nausea and emesis that started at approximately 3:00AM this morning. Wife reported that patient has vomited well over 6 times today. Wife reported that it started with food and has progressively gotten worse over the course of the day - wife reported that the patient's emesis started to turn to a dark brown, coffee ground appearance. Patient reported that he has been feeling weak. Patient reported that he has been experiencing leg cramping bilaterally since 3:00AM this morning - stated that he is fine at rest, but when he starts to move his feet he begins to feel the leg cramping sensation in his calves. Patient reported that he takes Motrin for his arthritis - reported that he takes at least 2-3 tablets in the morning and at bedtime for pain control. Denied fever, chills, chest pain, abdominal pain, urinary symptoms, melena, hematochezia, dizziness, syncope.  PCP: Dr. Monica Becton at [facility]   Past Medical History  Diagnosis Date  . Diabetes mellitus   . Hemolysis 02/23/2011  . Blood transfusion   . GERD (gastroesophageal reflux disease)   . Headache(784.0)   . Pneumonia   . Anemia    Past Surgical History  Procedure Laterality Date  . Tonsillectomy     Family History  Problem Relation Age of Onset  . Breast cancer Mother   . Rheumatic fever Father    History  Substance Use Topics  . Smoking status: Current Every Day Smoker -- 2.00 packs/day    Types: Cigarettes  . Smokeless tobacco:  Not on file  . Alcohol Use: No    Review of Systems  Constitutional: Negative for fever and chills.  Respiratory: Negative for chest tightness and shortness of breath.   Cardiovascular: Negative for chest pain.  Gastrointestinal: Positive for nausea and vomiting. Negative for abdominal pain, constipation, blood in stool and anal bleeding.  Neurological: Positive for weakness. Negative for dizziness.  All other systems reviewed and are negative.    Allergies  Zithromax and Avelox  Home Medications   Current Outpatient Rx  Name  Route  Sig  Dispense  Refill  . Aclidinium Bromide 400 MCG/ACT AEPB   Inhalation   Inhale 1 puff into the lungs 2 (two) times daily.   2 each   0   . famotidine (PEPCID) 20 MG tablet   Oral   Take 20 mg by mouth at bedtime as needed. For indigestion          . Fluticasone-Salmeterol (ADVAIR) 250-50 MCG/DOSE AEPB   Inhalation   Inhale 1 puff into the lungs 2 (two) times daily.         Marland Kitchen ibuprofen (ADVIL,MOTRIN) 200 MG tablet   Oral   Take 400 mg by mouth every 6 (six) hours as needed. For pain           . insulin aspart (NOVOLOG) 100 UNIT/ML injection   Subcutaneous   Inject 2-7 Units into the skin daily as needed for high blood sugar. Sliding scale         .  insulin glargine (LANTUS) 100 UNIT/ML injection   Subcutaneous   Inject 40 Units into the skin every morning.         . loratadine (CLARITIN) 10 MG tablet   Oral   Take 10 mg by mouth at bedtime.           . roflumilast (DALIRESP) 500 MCG TABS tablet   Oral   Take 1 tablet (500 mcg total) by mouth daily.   14 tablet   0   . sulfamethoxazole-trimethoprim (BACTRIM DS) 800-160 MG per tablet   Oral   Take 1 tablet by mouth 2 (two) times daily.   20 tablet   0    BP 152/86  Pulse 94  Temp(Src) 97.6 F (36.4 C) (Oral)  Resp 32  Ht 5\' 10"  (1.778 m)  Wt 165 lb (74.844 kg)  BMI 23.68 kg/m2  SpO2 100% Physical Exam  Nursing note and vitals  reviewed. Constitutional: He is oriented to person, place, and time. He appears well-developed and well-nourished. No distress.  Coffee ground emesis noted to emesis bag  HENT:  Head: Normocephalic and atraumatic.  Eyes: Conjunctivae and EOM are normal. Pupils are equal, round, and reactive to light. Right eye exhibits no discharge. Left eye exhibits no discharge.  Neck: Normal range of motion. Neck supple.  Cardiovascular: Normal rate, regular rhythm and normal heart sounds.  Exam reveals no friction rub.   No murmur heard. Pulses:      Radial pulses are 2+ on the right side, and 2+ on the left side.       Dorsalis pedis pulses are 2+ on the right side, and 2+ on the left side.  Negative swelling noted to bilateral lower extremities Negative pitting edema noted Cap refill < 3 seconds Negative discomfort upon palpation to the calves bilaterally   Pulmonary/Chest: Effort normal and breath sounds normal. No respiratory distress. He has no wheezes. He has no rales.  Abdominal: Soft. Bowel sounds are normal. There is no tenderness. There is no guarding.  Musculoskeletal:  Discomfort to the calves bilaterally with motion of the feet and ankles.   Lymphadenopathy:    He has no cervical adenopathy.  Neurological: He is alert and oriented to person, place, and time. He exhibits normal muscle tone. Coordination normal.  Skin: Skin is warm and dry. No rash noted. He is not diaphoretic. No erythema. There is pallor.  Psychiatric: He has a normal mood and affect. His behavior is normal. Thought content normal.    ED Course  Procedures (including critical care time)  4:49 PM This provider spoke with Dr. Russella Dar, GI, discussed case, presentation, history, labs/results - GI to consult patient.   5:39 PM This provider spoke with Dr. Lendell Caprice, Hospitalist - discussed case, history, presentation, labs/imaging with physician. Patient to be admitted to the hospital as inpatient to MedSurg floor.    Results for orders placed during the hospital encounter of 02/04/13  CBC WITH DIFFERENTIAL      Result Value Range   WBC 11.5 (*) 4.0 - 10.5 K/uL   RBC 4.88  4.22 - 5.81 MIL/uL   Hemoglobin 15.5  13.0 - 17.0 g/dL   HCT 82.9  56.2 - 13.0 %   MCV 91.2  78.0 - 100.0 fL   MCH 31.8  26.0 - 34.0 pg   MCHC 34.8  30.0 - 36.0 g/dL   RDW 86.5  78.4 - 69.6 %   Platelets 260  150 - 400 K/uL   Neutrophils Relative % 85 (*)  43 - 77 %   Neutro Abs 9.8 (*) 1.7 - 7.7 K/uL   Lymphocytes Relative 8 (*) 12 - 46 %   Lymphs Abs 0.9  0.7 - 4.0 K/uL   Monocytes Relative 7  3 - 12 %   Monocytes Absolute 0.8  0.1 - 1.0 K/uL   Eosinophils Relative 0  0 - 5 %   Eosinophils Absolute 0.0  0.0 - 0.7 K/uL   Basophils Relative 0  0 - 1 %   Basophils Absolute 0.0  0.0 - 0.1 K/uL  COMPREHENSIVE METABOLIC PANEL      Result Value Range   Sodium 137  135 - 145 mEq/L   Potassium 4.5  3.5 - 5.1 mEq/L   Chloride 99  96 - 112 mEq/L   CO2 24  19 - 32 mEq/L   Glucose, Bld 229 (*) 70 - 99 mg/dL   BUN 14  6 - 23 mg/dL   Creatinine, Ser 1.61  0.50 - 1.35 mg/dL   Calcium 09.6  8.4 - 04.5 mg/dL   Total Protein 7.4  6.0 - 8.3 g/dL   Albumin 4.6  3.5 - 5.2 g/dL   AST 18  0 - 37 U/L   ALT 11  0 - 53 U/L   Alkaline Phosphatase 80  39 - 117 U/L   Total Bilirubin 1.1  0.3 - 1.2 mg/dL   GFR calc non Af Amer 82 (*) >90 mL/min   GFR calc Af Amer >90  >90 mL/min  GLUCOSE, CAPILLARY      Result Value Range   Glucose-Capillary 212 (*) 70 - 99 mg/dL  OCCULT BLOOD X 1 CARD TO LAB, STOOL      Result Value Range   Fecal Occult Bld POSITIVE (*) NEGATIVE  OCCULT BLOOD, POC DEVICE      Result Value Range   Fecal Occult Bld POSITIVE (*) NEGATIVE  CG4 I-STAT (LACTIC ACID)      Result Value Range   Lactic Acid, Venous 1.18  0.5 - 2.2 mmol/L  POCT I-STAT TROPONIN I      Result Value Range   Troponin i, poc 0.00  0.00 - 0.08 ng/mL   Comment 3            Dg Chest 2 View  02/04/2013   CLINICAL DATA:  Shortness of breath,  weakness, vomiting  EXAM: CHEST  2 VIEW  COMPARISON:  [facility] chest radiographs dated 02/01/2013  FINDINGS: Lungs are clear. No pleural effusion or pneumothorax.  The heart is normal in size.  Visualized osseous structures are within normal limits.  IMPRESSION: No evidence of acute cardiopulmonary disease.   Electronically Signed   By: Charline Bills M.D.   On: 02/04/2013 16:12   Dg Chest 2 View  02/03/2013   CLINICAL DATA:  Persistent cough, smoker, history diabetes  EXAM: CHEST  2 VIEW  COMPARISON:  02/25/2011  FINDINGS: Normal heart size, mediastinal contours, and pulmonary vascularity.  Lungs appear minimally hyperinflated with biapical scarring.  No acute infiltrate, pleural effusion or pneumothorax.  No acute osseous findings.  Degenerative disc disease changes with scoliosis at lumbar spine.  IMPRESSION: No acute abnormalities.   Electronically Signed   By: Ulyses Southward M.D.   On: 02/03/2013 08:25   Labs Review Labs Reviewed  CBC WITH DIFFERENTIAL - Abnormal; Notable for the following:    WBC 11.5 (*)    Neutrophils Relative % 85 (*)    Neutro Abs 9.8 (*)    Lymphocytes Relative  8 (*)    All other components within normal limits  COMPREHENSIVE METABOLIC PANEL - Abnormal; Notable for the following:    Glucose, Bld 229 (*)    GFR calc non Af Amer 82 (*)    All other components within normal limits  GLUCOSE, CAPILLARY - Abnormal; Notable for the following:    Glucose-Capillary 212 (*)    All other components within normal limits  OCCULT BLOOD X 1 CARD TO LAB, STOOL - Abnormal; Notable for the following:    Fecal Occult Bld POSITIVE (*)    All other components within normal limits  OCCULT BLOOD, POC DEVICE - Abnormal; Notable for the following:    Fecal Occult Bld POSITIVE (*)    All other components within normal limits  URINALYSIS, ROUTINE W REFLEX MICROSCOPIC  CG4 I-STAT (LACTIC ACID)  POCT I-STAT TROPONIN I  POCT GASTRIC OCCULT BLOOD (1-CARD TO LAB)   Imaging  Review Dg Chest 2 View  02/04/2013   CLINICAL DATA:  Shortness of breath, weakness, vomiting  EXAM: CHEST  2 VIEW  COMPARISON:  [facility] chest radiographs dated 02/01/2013  FINDINGS: Lungs are clear. No pleural effusion or pneumothorax.  The heart is normal in size.  Visualized osseous structures are within normal limits.  IMPRESSION: No evidence of acute cardiopulmonary disease.   Electronically Signed   By: Charline Bills M.D.   On: 02/04/2013 16:12   Dg Chest 2 View  02/03/2013   CLINICAL DATA:  Persistent cough, smoker, history diabetes  EXAM: CHEST  2 VIEW  COMPARISON:  02/25/2011  FINDINGS: Normal heart size, mediastinal contours, and pulmonary vascularity.  Lungs appear minimally hyperinflated with biapical scarring.  No acute infiltrate, pleural effusion or pneumothorax.  No acute osseous findings.  Degenerative disc disease changes with scoliosis at lumbar spine.  IMPRESSION: No acute abnormalities.   Electronically Signed   By: Ulyses Southward M.D.   On: 02/03/2013 08:25    EKG Interpretation    Date/Time:  Saturday February 04 2013 14:56:05 EST Ventricular Rate:  85 PR Interval:  138 QRS Duration: 85 QT Interval:  376 QTC Calculation: 447 R Axis:   44 Text Interpretation:  Sinus rhythm Right atrial enlargement Minimal ST depression, inferior leads Compared to previous tracing Right axis deviation NO LONGER PRESENT Confirmed by Fonnie Jarvis  MD, Jonny Ruiz [phone]) on 02/04/2013 5:22:29 PM           CRITICAL CARE Performed by: Raymon Mutton   Total critical care time: 30  Critical care time was exclusive of separately billable procedures and treating other patients.  Critical care was necessary to treat or prevent imminent or life-threatening deterioration.  Critical care was time spent personally by me on the following activities: development of treatment plan with patient and/or surrogate as well as nursing, discussions with consultants, evaluation of patient's  response to treatment, examination of patient, obtaining history from patient or surrogate, ordering and performing treatments and interventions, ordering and review of laboratory studies, ordering and review of radiographic studies, pulse oximetry and re-evaluation of patient's condition.  MDM   1. Hematemesis   2. Gastritis    Medications  pantoprazole (PROTONIX) injection 40 mg (not administered)  ondansetron (ZOFRAN) injection 4 mg (not administered)  sodium chloride 0.9 % bolus 1,000 mL (not administered)  ondansetron (ZOFRAN) injection 4 mg (4 mg Intravenous Given 02/04/13 1404)  sodium chloride 0.9 % bolus 1,000 mL (1,000 mLs Intravenous New Bag/Given 02/04/13 1527)  ondansetron (ZOFRAN) injection 4 mg (4 mg Intravenous Given 02/04/13  1613)  fentaNYL (SUBLIMAZE) injection 50 mcg (50 mcg Intravenous Given 02/04/13 1620)   Filed Vitals:   02/04/13 1340 02/04/13 1537  BP: 135/77 152/86  Pulse: 118 94  Temp: 97.6 F (36.4 C)   TempSrc: Oral   Resp: 22 32  Height: 5\' 10"  (1.778 m)   Weight: 165 lb (74.844 kg)   SpO2: 99% 100%   Patient presenting to the ED with nausea and emesis that started at approximately 3:00AM this morning. Wife reported at least 6-10 episodes of emesis - at first started off as food contents, but has now turned to coffee ground emesis.  Alert and oriented. GCS 15. Heart rate and rhythm normal. Lungs clear to auscultation bilaterally to upper and lower lobes. BS normoactive in all 4 quadrants. Negative acute abdomen, negative peritoneal signs - soft, non-tender, negative guarding noted. Negative swelling, erythema, inflammation noted to BLE - negative pitting edema noted. Cap refill < 3 seconds.  EKG negative ischemic changes noted - negative new findings noted. Troponin negative elevation. Lactic acid negative elevation. CBC noted mild elevation of WBC - 11.5. CMP negative findings noted. Fecal occult positive - Hgb 15.5, Hct 44.5. Chest xray negative acute  cardiopulmonary disease noted.  Patient given numerous rounds of IV fluids, IV protonix administered. Discussed case with GI - GI to consult. Discussed case with Dr. Lendell Caprice, Internal Medicine - patient to be admitted to the hospital - MedSurg inpatient. Discussed results with patient and wife - all questions answered. Patient agreed to plan of care, understood, all questions answered. Patient stable for transfer.   Raymon Mutton, PA-C 02/05/13 1406

## 2013-02-04 NOTE — ED Provider Notes (Signed)
Medical screening examination/treatment/procedure(s) were conducted as a shared visit with non-physician practitioner(s) and myself.  I personally evaluated the patient during the encounter.  EKG Interpretation    Date/Time:  Saturday February 04 2013 14:56:05 EST Ventricular Rate:  85 PR Interval:  138 QRS Duration: 85 QT Interval:  376 QTC Calculation: 447 R Axis:   44 Text Interpretation:  Sinus rhythm Right atrial enlargement Minimal ST depression, inferior leads Compared to previous tracing Right axis deviation NO LONGER PRESENT Confirmed by Fonnie Jarvis  MD, Thomas Sandoval Patient [phone]) on 02/04/2013 5:22:29 PM           Abdomen nontender, upper GI bleed, hemodynamically stable.  Hurman Horn, MD 02/05/13 [phone]

## 2013-02-04 NOTE — ED Notes (Signed)
Pt c/o B/L leg cramps onset 0300 today. Pt reports began vomiting at 0900 today. Pt denies change in diet. Pt was actively vomiting in triage. Pt reports that his blood sugar has been elevated past couple of days in the 200's. Today it was 106.

## 2013-02-05 DIAGNOSIS — K920 Hematemesis: Secondary | ICD-10-CM

## 2013-02-05 DIAGNOSIS — R1115 Cyclical vomiting syndrome unrelated to migraine: Secondary | ICD-10-CM

## 2013-02-05 DIAGNOSIS — R195 Other fecal abnormalities: Secondary | ICD-10-CM

## 2013-02-05 LAB — GLUCOSE, CAPILLARY
Glucose-Capillary: 136 mg/dL — ABNORMAL HIGH (ref 70–99)
Glucose-Capillary: 165 mg/dL — ABNORMAL HIGH (ref 70–99)
Glucose-Capillary: 317 mg/dL — ABNORMAL HIGH (ref 70–99)
Glucose-Capillary: 380 mg/dL — ABNORMAL HIGH (ref 70–99)
Glucose-Capillary: 63 mg/dL — ABNORMAL LOW (ref 70–99)
Glucose-Capillary: 99 mg/dL (ref 70–99)

## 2013-02-05 LAB — HEMOGLOBIN AND HEMATOCRIT, BLOOD
HCT: 35.6 % — ABNORMAL LOW (ref 39.0–52.0)
Hemoglobin: 13.8 g/dL (ref 13.0–17.0)
Hemoglobin: 12.7 g/dL — ABNORMAL LOW (ref 13.0–17.0)

## 2013-02-05 MED ORDER — DEXTROSE 50 % IV SOLN
INTRAVENOUS | Status: AC
  Filled 2013-02-05: qty 50

## 2013-02-05 MED ORDER — INSULIN ASPART 100 UNIT/ML ~~LOC~~ SOLN
0.0000 [IU] | Freq: Three times a day (TID) | SUBCUTANEOUS | Status: DC
  Administered 2013-02-05: 9 [IU] via SUBCUTANEOUS
  Administered 2013-02-05: 2 [IU] via SUBCUTANEOUS
  Administered 2013-02-06: 7 [IU] via SUBCUTANEOUS
  Administered 2013-02-06: 5 [IU] via SUBCUTANEOUS

## 2013-02-05 MED ORDER — DEXTROSE 50 % IV SOLN: 1.0000 | Freq: Once | INTRAVENOUS | Status: AC

## 2013-02-05 MED ORDER — DEXTROSE 50 % IV SOLN
25.0000 mL | Freq: Once | INTRAVENOUS | Status: AC | PRN
  Administered 2013-02-05: 25 mL via INTRAVENOUS

## 2013-02-05 MED ORDER — PHENOL 1.4 % MT LIQD
1.0000 | OROMUCOSAL | Status: DC | PRN
  Administered 2013-02-05: 1 via OROMUCOSAL
  Filled 2013-02-05: qty 177

## 2013-02-05 MED ORDER — INSULIN GLARGINE 100 UNIT/ML ~~LOC~~ SOLN
34.0000 [IU] | Freq: Every day | SUBCUTANEOUS | Status: DC
  Filled 2013-02-05: qty 0.34

## 2013-02-05 NOTE — Progress Notes (Signed)
Utilization Review Completed.Thomas Sandoval T11/30/2014  

## 2013-02-05 NOTE — Consult Note (Signed)
Referring Provider: No ref. provider found Primary Care Physician:  Rudi Heap, MD Primary Gastroenterologist:  None, unassigned  Reason for Consultation:  N/V with possible coffee ground emesis; heme positive stool  HPI: Thomas Sandoval is a 57 y.o. male with PMH of IDDM presented to [facility] ED with intractable vomiting.  Says that he woke up yesterday AM with cramps in his legs and several episodes of vomiting.  Initially non-bloody, then started to become brown color and eventual if looked like there was red blood in the emesis.  Denies abdominal pain.  Had a BM yesterday AM but does not recall that it looked abnormal.  No reports of dark or bloody stools.  Says that he had coffee ground emesis in the past when he had PNA but never saw red blood.  Never saw a GI MD in the past.  In ED, had coffee ground emesis, gastroccult positive.  Rectal exam by EDP showed trace heme positive stool.  Blood pressure and Hgb normal.  Takes ibuprofen every day, about three pills per day for aches and pains. Started on bactrim and daliresp for URI 3 days ago.    Feeling better this AM.  Hgb remains normal (down slightly this AM but likely dilutional).  No further nausea or vomiting since arriving on the floor.  Has some reflux issues at home and takes pepcid as needed.   Past Medical History  Diagnosis Date  . Diabetes mellitus   . Hemolysis 02/23/2011  . Blood transfusion   . GERD (gastroesophageal reflux disease)   . Headache(784.0)   . Pneumonia   . Anemia     Past Surgical History  Procedure Laterality Date  . Tonsillectomy      Prior to Admission medications   Medication Sig Start Date End Date Taking? Authorizing Provider  Aclidinium Bromide 400 MCG/ACT AEPB Inhale 1 puff into the lungs 2 (two) times daily. 02/01/13  Yes Ileana Ladd, MD  famotidine (PEPCID) 20 MG tablet Take 20 mg by mouth at bedtime as needed. For indigestion    Yes Historical Provider, MD  Fluticasone-Salmeterol (ADVAIR)  250-50 MCG/DOSE AEPB Inhale 1 puff into the lungs 2 (two) times daily.   Yes Historical Provider, MD  ibuprofen (ADVIL,MOTRIN) 200 MG tablet Take 400 mg by mouth every 6 (six) hours as needed. For pain     Yes Historical Provider, MD  insulin aspart (NOVOLOG) 100 UNIT/ML injection Inject 2-7 Units into the skin daily as needed for high blood sugar. Sliding scale   Yes Historical Provider, MD  insulin glargine (LANTUS) 100 UNIT/ML injection Inject 40 Units into the skin every morning.   Yes Historical Provider, MD  loratadine (CLARITIN) 10 MG tablet Take 10 mg by mouth at bedtime.     Yes Historical Provider, MD  roflumilast (DALIRESP) 500 MCG TABS tablet Take 1 tablet (500 mcg total) by mouth daily. 02/01/13  Yes Ileana Ladd, MD  sulfamethoxazole-trimethoprim (BACTRIM DS) 800-160 MG per tablet Take 1 tablet by mouth 2 (two) times daily. 02/01/13  Yes Ileana Ladd, MD    Current Facility-Administered Medications  Medication Dose Route Frequency Provider Last Rate Last Dose  . 0.9 %  sodium chloride infusion   Intravenous Continuous Christiane Ha, MD 125 mL/hr at 02/05/13 0217    . acetaminophen (TYLENOL) tablet 650 mg  650 mg Oral Q6H PRN Christiane Ha, MD       Or  . acetaminophen (TYLENOL) suppository 650 mg  650 mg Rectal Q6H  PRN Christiane Ha, MD      . albuterol (PROVENTIL) (5 MG/ML) 0.5% nebulizer solution 2.5 mg  2.5 mg Nebulization Q2H PRN Christiane Ha, MD      . HYDROmorphone (DILAUDID) injection 0.5 mg  0.5 mg Intravenous Q4H PRN Christiane Ha, MD      . insulin aspart (novoLOG) injection 0-9 Units  0-9 Units Subcutaneous Q4H Christiane Ha, MD   2 Units at 02/05/13 0034  . mometasone-formoterol (DULERA) 100-5 MCG/ACT inhaler 2 puff  2 puff Inhalation BID Christiane Ha, MD   2 puff at 02/05/13 0827  . pantoprazole (PROTONIX) injection 40 mg  40 mg Intravenous Q12H Christiane Ha, MD      . phenol [facility]) mouth spray 1 spray  1 spray  Mouth/Throat PRN Jinger Neighbors, NP   1 spray at 02/05/13 0209  . promethazine (PHENERGAN) injection 25 mg  25 mg Intravenous Q6H PRN Christiane Ha, MD      . tiotropium [facility]) inhalation capsule 18 mcg  18 mcg Inhalation Daily Christiane Ha, MD   18 mcg at 02/05/13 1610    Allergies as of 02/04/2013 - Review Complete 02/04/2013  Allergen Reaction Noted  . Zithromax [azithromycin] Other (See Comments) 02/22/2011  . Avelox [moxifloxacin hcl in nacl]  02/26/2011    Family History  Problem Relation Age of Onset  . Breast cancer Mother   . Rheumatic fever Father     History   Social History  . Marital Status: Married    Spouse Name: N/A    Number of Children: N/A  . Years of Education: N/A   Occupational History  . Not on file.   Social History Main Topics  . Smoking status: Current Every Day Smoker -- 2.00 packs/day    Types: Cigarettes  . Smokeless tobacco: Not on file  . Alcohol Use: No  . Drug Use: No  . Sexual Activity: Yes   Other Topics Concern  . Not on file   Social History Narrative  . No narrative on file    Review of Systems: Ten point ROS is O/W negative except as mentioned in HPI.  Physical Exam: Vital signs in last 24 hours: Temp:  [97.6 F (36.4 C)-100.1 F (37.8 C)] 100.1 F (37.8 C) (11/30 0434) Pulse Rate:  [73-118] 78 (11/30 0434) Resp:  [11-32] 18 (11/30 0100) BP: (113-180)/(56-86) 146/56 mmHg (11/30 0434) SpO2:  [87 %-100 %] 98 % (11/30 0831) Weight:  [158 lb 8.2 oz (71.9 kg)-165 lb (74.844 kg)] 158 lb 8.2 oz (71.9 kg) (11/29 1849) Last BM Date: 02/03/13 General:  Alert, Well-developed, well-nourished, pleasant and cooperative in NAD Head:  Normocephalic and atraumatic. Eyes:  Sclera clear, no icterus.  Conjunctiva pink. Ears:  Normal auditory acuity. Mouth:  No deformity or lesions.   Lungs:  Expiratory wheezing heard at lung bases but cleared with cough. Heart:  Regular rate and rhythm; no murmurs, clicks, rubs,  or  gallops. Abdomen:  Soft, non-distended.  BS present.  Non-tender.   Rectal:  Deferred. Stools trace heme positive in ED.  Msk:  Symmetrical without gross deformities. Pulses:  Normal pulses noted. Extremities:  Without clubbing or edema. Neurologic:  Alert and  oriented x4;  grossly normal neurologically. Skin:  Intact without significant lesions or rashes. Psych:  Alert and cooperative. Normal mood and affect.  Intake/Output from previous day: 11/29 0701 - 11/30 0700 In: -  Out: 727 [Urine:725; Emesis/NG output:2] Intake/Output this shift: Total I/O In: -  Out: 450 [Urine:450]  Lab Results:  Recent Labs  02/04/13 1420 02/04/13 2130  WBC 11.5*  --   HGB 15.5 13.2  HCT 44.5 37.4*  PLT 260  --    BMET  Recent Labs  02/04/13 1420  NA 137  K 4.5  CL 99  CO2 24  GLUCOSE 229*  BUN 14  CREATININE 1.01  CALCIUM 10.2   LFT  Recent Labs  02/04/13 1420  PROT 7.4  ALBUMIN 4.6  AST 18  ALT 11  ALKPHOS 80  BILITOT 1.1   Studies/Results: Dg Chest 2 View  02/04/2013   CLINICAL DATA:  Shortness of breath, weakness, vomiting  EXAM: CHEST  2 VIEW  COMPARISON:  [facility] chest radiographs dated 02/01/2013  FINDINGS: Lungs are clear. No pleural effusion or pneumothorax.  The heart is normal in size.  Visualized osseous structures are within normal limits.  IMPRESSION: No evidence of acute cardiopulmonary disease.   Electronically Signed   By: Charline Bills M.D.   On: 02/04/2013 16:12    IMPRESSION:  -Hematemesis/coffee ground emesis:  Resolved at this point.  Hgb is stable.  Rule out ulcer disease, MWT, esophagitis/erosive gastritis, etc. -Daily NSAID use  PLAN: -EGD tomorrow, 12/1. Ok for clear liquid diet for now then NPO after midnight. -Continue BID PPI for now. Anti-emetics prn. -Monitor Hb.   ZEHR, JESSICA D.  02/05/2013, 8:56 AM  Pager number [phone]     Attending physician's note   I have taken a history, examined the patient and  reviewed the chart. I agree with the Advanced Practitioner's note, impression and recommendations. N/V/coffe ground emesis and heme positive stool. R/O ulcer, MW tear, esophagitis, gastritis. EGD tomorrow. Colonoscopy in near future for heme + stool, likely as outpatient.   Meryl Dare, MD Clementeen Graham

## 2013-02-05 NOTE — Progress Notes (Signed)
Hypoglycemic Event  CBG: 63  Treatment: D50 IV 25 mL  Symptoms: None  Follow-up CBG: Time:136 CBG Result:823  Possible Reasons for Event: Other: NPO  Comments/MD notified:Sullivan, changed diet    Harlon Flor, Berl Bonfanti Ray  Remember to initiate Hypoglycemia Order Set & complete

## 2013-02-05 NOTE — Progress Notes (Signed)
Assessment/Plan:   Intractable vomiting with limited coffee ground emesis: resolved.  Looks much better today. May have been secondary to bactrim, which has been discontinued.  Chronic NSAIDs.  Discussed with Dr. Russella Dar.  Let patient eat today, then EGD tomorrow.  Continue PPI, serial H/H, decrease IVF.  BP and Hgb stable    COPD (chronic obstructive pulmonary disease) stable. CXR negative, and lungs clear. No abx needed at this point    Heme positive stool    DM type 2 (diabetes mellitus, type 2) with hypoglycemia this am.  Improved after D50.  Hold lantus.  Change SSI to qac and monitor.  Subjective: No further vomiting.  No nausea.  No stool since yesterday.  No abdominal pain.  Objective: Vital signs in last 24 hours: Temp:  [97.6 F (36.4 C)-100.1 F (37.8 C)] 100.1 F (37.8 C) (11/30 0434) Pulse Rate:  [73-118] 78 (11/30 0434) Resp:  [11-32] 18 (11/30 0100) BP: (113-180)/(56-86) 146/56 mmHg (11/30 0434) SpO2:  [87 %-100 %] 98 % (11/30 0831) Weight:  [71.9 kg (158 lb 8.2 oz)-74.844 kg (165 lb)] 71.9 kg (158 lb 8.2 oz) (11/29 1849) Weight change:  Last BM Date: 02/03/13  Intake/Output from previous day: 11/29 0701 - 11/30 0700 In: -  Out: 727 [Urine:725; Emesis/NG output:2] Intake/Output this shift: Total I/O In: -  Out: 450 [Urine:450]  Gen: much more comfortable. Alert. Oriented, nontoxic HEENT:  MMM LUngs CTA without WRR CV RRR without MGR Abd: soft, NT, ND Ext no CCE  Lab Results:  Recent Labs  02/04/13 1420 02/04/13 2130  WBC 11.5*  --   HGB 15.5 13.2  HCT 44.5 37.4*  PLT 260  --    BMET  Recent Labs  02/04/13 1420  NA 137  K 4.5  CL 99  CO2 24  GLUCOSE 229*  BUN 14  CREATININE 1.01  CALCIUM 10.2    Studies/Results: Dg Chest 2 View  02/04/2013   CLINICAL DATA:  Shortness of breath, weakness, vomiting  EXAM: CHEST  2 VIEW  COMPARISON:  [facility] chest radiographs dated 02/01/2013  FINDINGS: Lungs are clear. No pleural effusion or  pneumothorax.  The heart is normal in size.  Visualized osseous structures are within normal limits.  IMPRESSION: No evidence of acute cardiopulmonary disease.   Electronically Signed   By: Charline Bills M.D.   On: 02/04/2013 16:12    Medications:  Scheduled Meds: . insulin aspart  0-9 Units Subcutaneous TID WC  . mometasone-formoterol  2 puff Inhalation BID  . pantoprazole (PROTONIX) IV  40 mg Intravenous Q12H  . tiotropium  18 mcg Inhalation Daily   Continuous Infusions: . sodium chloride 125 mL/hr at 02/05/13 0217   PRN Meds:.acetaminophen, acetaminophen, albuterol, HYDROmorphone (DILAUDID) injection, phenol, promethazine   LOS: 1 day   Celesta Funderburk L 02/05/2013, 8:58 AM

## 2013-02-05 NOTE — Progress Notes (Signed)
Pt requesting to go outside to smoke. Pt was informed of the facility's no smoking policy and that if he were to leave the building he would be considered leaving AMA. He asked if the MD could give him permission to walk outside, MD was paged and denied his request. He asked what would happen if he smoked in the bathroom. He was told that there is oxygen in the hospital and smoking is contraindicated as well as against hospital policy. RN offered to request nicotine patch, he refused this offer stating "I've tried that before and it doesn't work." Will continue to monitor.

## 2013-02-05 NOTE — Progress Notes (Signed)
Pt desires to leave AMA. MD on-call notified. No assessment completed on patient. He refuses, states that he is leaving.

## 2013-02-06 ENCOUNTER — Encounter (HOSPITAL_COMMUNITY)
Admission: EM | Disposition: A | Discharge status: Home / Self Care | Payer: Self-pay | Source: Home / Self Care | Attending: Internal Medicine

## 2013-02-06 ENCOUNTER — Encounter (HOSPITAL_COMMUNITY): Payer: Self-pay | Admitting: *Deleted

## 2013-02-06 DIAGNOSIS — K2970 Gastritis, unspecified, without bleeding: Principal | ICD-10-CM

## 2013-02-06 DIAGNOSIS — K219 Gastro-esophageal reflux disease without esophagitis: Secondary | ICD-10-CM

## 2013-02-06 HISTORY — PX: ESOPHAGOGASTRODUODENOSCOPY: SHX5428

## 2013-02-06 LAB — GLUCOSE, CAPILLARY
Glucose-Capillary: 313 mg/dL — ABNORMAL HIGH (ref 70–99)
Glucose-Capillary: 260 mg/dL — ABNORMAL HIGH (ref 70–99)
Glucose-Capillary: 204 mg/dL — ABNORMAL HIGH (ref 70–99)

## 2013-02-06 LAB — HEMOGLOBIN AND HEMATOCRIT, BLOOD: HCT: 41.6 % (ref 39.0–52.0)

## 2013-02-06 SURGERY — EGD (ESOPHAGOGASTRODUODENOSCOPY)
Anesthesia: Moderate Sedation

## 2013-02-06 MED ORDER — SODIUM CHLORIDE 0.9 % IV SOLN
INTRAVENOUS | Status: DC
  Administered 2013-02-06: 500 mL via INTRAVENOUS

## 2013-02-06 MED ORDER — FENTANYL CITRATE 0.05 MG/ML IJ SOLN
INTRAMUSCULAR | Status: AC
  Filled 2013-02-06: qty 4

## 2013-02-06 MED ORDER — FENTANYL CITRATE 0.05 MG/ML IJ SOLN
INTRAMUSCULAR | Status: DC | PRN
  Administered 2013-02-06 (×2): 25 ug via INTRAVENOUS

## 2013-02-06 MED ORDER — PANTOPRAZOLE SODIUM 40 MG PO TBEC: 40.0000 mg | DELAYED_RELEASE_TABLET | Freq: Two times a day (BID) | ORAL | Status: DC

## 2013-02-06 MED ORDER — BUTAMBEN-TETRACAINE-BENZOCAINE 2-2-14 % EX AERO
INHALATION_SPRAY | CUTANEOUS | Status: DC | PRN
  Administered 2013-02-06: 2 via TOPICAL

## 2013-02-06 MED ORDER — INSULIN GLARGINE 100 UNIT/ML ~~LOC~~ SOLN
20.0000 [IU] | Freq: Every day | SUBCUTANEOUS | Status: DC
  Administered 2013-02-06: 20 [IU] via SUBCUTANEOUS
  Filled 2013-02-06: qty 0.2

## 2013-02-06 MED ORDER — MIDAZOLAM HCL 5 MG/ML IJ SOLN
INTRAMUSCULAR | Status: AC
  Filled 2013-02-06: qty 2

## 2013-02-06 MED ORDER — MIDAZOLAM HCL 10 MG/2ML IJ SOLN
INTRAMUSCULAR | Status: DC | PRN
  Administered 2013-02-06 (×3): 2 mg via INTRAVENOUS

## 2013-02-06 MED ORDER — DIPHENHYDRAMINE HCL 50 MG/ML IJ SOLN
INTRAMUSCULAR | Status: AC
  Filled 2013-02-06: qty 1

## 2013-02-06 MED ORDER — ACETAMINOPHEN 325 MG PO TABS: 650.0000 mg | ORAL_TABLET | Freq: Four times a day (QID) | ORAL | Status: DC | PRN

## 2013-02-06 NOTE — Progress Notes (Signed)
On-call coverage came to unit to speak with patient about leaving AMA. Pt decided to stay. Will continue to monitor.

## 2013-02-06 NOTE — Progress Notes (Addendum)
When RN went to pt. Room to give him D/C instructions and take out IV.  Pt. Or wife was not in room.  Call pt.'s wife at [phone], no answer and left message.  Will await for return call or pt. To return to his room.  Forbes Cellar, RN

## 2013-02-06 NOTE — Op Note (Addendum)
[facility] [address] [place], [address]   ENDOSCOPY PROCEDURE REPORT  PATIENT: Thomas Sandoval, Thomas Sandoval  MR#: [id number] BIRTHDATE: Aug 18, 1955 , 57  yrs. old GENDER: Male ENDOSCOPIST: Hart Carwin, MD REFERRED BY:  Dr Nani Skillern PROCEDURE DATE:  02/06/2013 PROCEDURE:  EGD w/ biopsy ASA CLASS:     Class II INDICATIONS:  Nausea.   Vomiting.   Hematemesis.   A.  acute episode of nausea vomiting and low volume hematemesis.  Hemoglobin drop from 15.5-13.2.  Stool is Hemoccult positive. MEDICATIONS: These medications were titrated to patient response per physician's verbal order, Fentanyl 50 mcg IV, and Versed 7 mg IV TOPICAL ANESTHETIC: Cetacaine Spray  DESCRIPTION OF PROCEDURE: After the risks benefits and alternatives of the procedure were thoroughly explained, informed consent was obtained.  The Pentax Gastroscope [id number] endoscope was introduced through the mouth and advanced to the second portion of the duodenum. Without limitations.  The instrument was slowly withdrawn as the mucosa was fully examined.      Esophagus: esophageal mucosa was normal in the proximal and midesophagus. Squamo columnar junction was unremarkable. There was no stricture or MalloryJanann August tear Stoma: The mucosa of the cardia was intensely erythematous with submucosal hemorrhages and Cameron-like erosions although there was no definite hiatal hernia. These changes were most likely due to vomiting. The body of the stomach was otherwise unremarkable. There was at stllate scar in the gastric antrum which was consistent with healed gastric ulcer. There was no active ulcer. Biopsies were obtained from the scar. Gastric outlet  was unremarkable Duodenum: Duodenal bulb and descending duodenum were unremarkable. Retroflexion of the endoscope in the stomach confirmed presence of intense erythema and mucosal hemorrhages in the cardia[         The scope was then withdrawn  from the patient and the procedure completed.  COMPLICATIONS: There were no complications. ENDOSCOPIC IMPRESSION: 1.moderately severe gastritis in gastric cardia secondary to vomiting. No definite Mallory-Weiss tear 2. Healed gastric ulcer in gastric antrum. No antritis Biopsies taken 3. normal gastric outlet RECOMMENDATIONS: 1.  Await pathology results 2.  advance diet as tolerated 3. Continue oral PPI 4. Continue when necessary anti-emetics 5. Hold NSAID's and ASA and aspirin 2 weeks  REPEAT EXAM: for EGD pending biopsy results.  eSigned:  Hart Carwin, MD 02/06/2013 3:23 PM Revised: 02/06/2013 3:23 PM  CC:  PATIENT NAME:  Kel, Senn MR#: [id number]

## 2013-02-06 NOTE — Discharge Summary (Signed)
Physician Discharge Summary  Thomas Sandoval ZOX:[id number] DOB: 30-Aug-1955 DOA: 02/04/2013  PCP: Rudi Heap, MD  Admit date: 02/04/2013 Discharge date: 02/06/2013  Time spent: greater than 30 minutes  Recommendations for Outpatient Follow-up:  F/u gastric biopsy  Discharge Diagnoses:    Coffee ground emesis Tobacco abuse (found smoking in his room day of discharge)   Intractable vomiting   Heme positive stool   COPD (chronic obstructive pulmonary disease)   DM type 2 (diabetes mellitus, type 2)   Other specified gastritis without mention of hemorrhage  Discharge Condition: stable  Filed Weights   02/04/13 1340 02/04/13 1849  Weight: 74.844 kg (165 lb) 71.9 kg (158 lb 8.2 oz)    History of present illness:  57 y.o. male presents with intractable vomiting. Initially nonbloody, started becoming darker. In ED, had coffee ground emesis, gastroccult positive. No F/C/diarrhea. Rectal exam by EDP showed trace heme positive stool. No abdominal pain. Blood pressure and hgb normal. No h/o GIB in the past. Takes ibuprofen several times daily. Started on bactrim and daliresp for URI 3 days ago. Dr. Russella Dar consulted by EDP. He will consult in am. HBG and blood pressure not low  Hospital Course:  Admitted to hospitalist service.  GI consulted.  Bactrim stopped, as may have led to vomiting.  No signs of ongoing infection.  Started on bid PPI.  NSAIDs stopped.  Vomiting resolved.  hbg and BP remained stable.  EGD showed healed gastric ulcer. Biopsies taken.  Also moderately severe gastritis.  Other problems remained stable  Of note, patient almost left AMA the night before EGD.  Also found smoking in his room at discharge.  He left before discharge instructions and Rx given by RN.  She faxed him the instructions, faxed the Rx to his pharmacy  Procedures: EGD by Dr. Juanda Chance showed moderately severe gastritis in gastric cardia secondary to  vomiting. No definite Mallory-Weiss tear  2. Healed  gastric ulcer in gastric antrum. No antritis Biopsies  taken  3. normal gastric outlet    Consultations:  GI  Discharge Exam: Filed Vitals:   02/06/13 1551  BP: 116/65  Pulse: 71  Temp: 97.5 F (36.4 C)  Resp: 20    General: room smells of fresh tobacco smoke. Talking on phone.  cigarrette butts in commode "so fire me!" Cardiovascular: RRR Respiratory: CTA  Discharge Instructions  Discharge Orders   Future Orders Complete By Expires   Activity as tolerated - No restrictions  As directed    Diet Carb Modified  As directed    Discharge instructions  As directed    Comments:     No aspirin, or nonsteroidal antiinflammatory products       Medication List    STOP taking these medications       famotidine 20 MG tablet  Commonly known as:  PEPCID     ibuprofen 200 MG tablet  Commonly known as:  ADVIL,MOTRIN     sulfamethoxazole-trimethoprim 800-160 MG per tablet  Commonly known as:  BACTRIM DS      TAKE these medications       acetaminophen 325 MG tablet  Commonly known as:  TYLENOL  Take 2 tablets (650 mg total) by mouth every 6 (six) hours as needed for mild pain (or Fever >/= 101).     Aclidinium Bromide 400 MCG/ACT Aepb  Inhale 1 puff into the lungs 2 (two) times daily.     Fluticasone-Salmeterol 250-50 MCG/DOSE Aepb  Commonly known as:  ADVAIR  Inhale 1  puff into the lungs 2 (two) times daily.     insulin aspart 100 UNIT/ML injection  Commonly known as:  novoLOG  Inject 2-7 Units into the skin daily as needed for high blood sugar. Sliding scale     insulin glargine 100 UNIT/ML injection  Commonly known as:  LANTUS  Inject 40 Units into the skin every morning.     loratadine 10 MG tablet  Commonly known as:  CLARITIN  Take 10 mg by mouth at bedtime.     pantoprazole 40 MG tablet  Commonly known as:  PROTONIX  Take 1 tablet (40 mg total) by mouth 2 (two) times daily.     roflumilast 500 MCG Tabs tablet  Commonly known as:  DALIRESP  Take 1  tablet (500 mcg total) by mouth daily.       Allergies  Allergen Reactions  . Zithromax [Azithromycin] Other (See Comments)    Severe stomach cramps; told to list as an allergy by dr. Years ago  . Avelox [Moxifloxacin Hcl In Nacl]     Hemolysis       Follow-up Information   Follow up with Rudi Heap, MD In 2 weeks.   Specialty:  Family Medicine   Contact information:   [address] [place] [address] [phone]        The results of significant diagnostics from this hospitalization (including imaging, microbiology, ancillary and laboratory) are listed below for reference.    Significant Diagnostic Studies: Dg Chest 2 View  02/04/2013   CLINICAL DATA:  Shortness of breath, weakness, vomiting  EXAM: CHEST  2 VIEW  COMPARISON:  [facility] chest radiographs dated 02/01/2013  FINDINGS: Lungs are clear. No pleural effusion or pneumothorax.  The heart is normal in size.  Visualized osseous structures are within normal limits.  IMPRESSION: No evidence of acute cardiopulmonary disease.   Electronically Signed   By: Charline Bills M.D.   On: 02/04/2013 16:12   Dg Chest 2 View  02/03/2013   CLINICAL DATA:  Persistent cough, smoker, history diabetes  EXAM: CHEST  2 VIEW  COMPARISON:  02/25/2011  FINDINGS: Normal heart size, mediastinal contours, and pulmonary vascularity.  Lungs appear minimally hyperinflated with biapical scarring.  No acute infiltrate, pleural effusion or pneumothorax.  No acute osseous findings.  Degenerative disc disease changes with scoliosis at lumbar spine.  IMPRESSION: No acute abnormalities.   Electronically Signed   By: Ulyses Southward M.D.   On: 02/03/2013 08:25    Microbiology: No results found for this or any previous visit (from the past 240 hour(s)).   Labs: Basic Metabolic Panel:  Recent Labs Lab 02/04/13 1420  NA 137  K 4.5  CL 99  CO2 24  GLUCOSE 229*  BUN 14  CREATININE 1.01  CALCIUM 10.2   Liver Function  Tests:  Recent Labs Lab 02/04/13 1420  AST 18  ALT 11  ALKPHOS 80  BILITOT 1.1  PROT 7.4  ALBUMIN 4.6   No results found for this basename: LIPASE, AMYLASE,  in the last 168 hours No results found for this basename: AMMONIA,  in the last 168 hours CBC:  Recent Labs Lab 02/04/13 1420 02/04/13 2130 02/05/13 1250 02/05/13 2216 02/06/13 1100  WBC 11.5*  --   --   --   --   NEUTROABS 9.8*  --   --   --   --   HGB 15.5 13.2 13.8 12.7* 14.8  HCT 44.5 37.4* 39.1 35.6* 41.6  MCV 91.2  --   --   --   --  PLT 260  --   --   --   --    Cardiac Enzymes: No results found for this basename: CKTOTAL, CKMB, CKMBINDEX, TROPONINI,  in the last 168 hours BNP: BNP (last 3 results) No results found for this basename: PROBNP,  in the last 8760 hours CBG:  Recent Labs Lab 02/05/13 2105 02/05/13 2255 02/06/13 0735 02/06/13 1155 02/06/13 1602  GLUCAP 317* 317* 313* 260* 204*   Signed:  Isaac Dubie L  [facility] 02/06/2013, 4:22 PM

## 2013-02-06 NOTE — Progress Notes (Signed)
Faxed copy of d/c instructions to pt. And prescriptions to [facility] in [place].  Pt. Signed and faxed the signature paged back to RN.  Forbes Cellar, RN

## 2013-02-06 NOTE — Progress Notes (Signed)
Pt. Requesting to be disconnected from IVF to go for a walk.  Informed pt. That he will have to take the IV pump with him while walking.  Pt. Stating that he is going for a walk to calm himself down before the procedure.  Text paged Dr. Lendell Caprice to inform her of above.  She said he could go out this one time for no more than 5 minutes.  Will disconnect his IV and continue to monitor.  Forbes Cellar, RN

## 2013-02-06 NOTE — Progress Notes (Signed)
Pt. Called back and reviewed d/c instructions over the phone.  Stated he wanted me to fax d/c instructions to him, also  Informed pt. To stop taking aspirin & nonsteroidal antiinflammatory products. Pt. Stated that wife took IV out of arm and that catheter was intact. Also to place hard copy of d/c instructions in bag for pt. To pick up later this week.  Will fax prescriptions to [facility] in [place].  Forbes Cellar, RN

## 2013-02-06 NOTE — Progress Notes (Signed)
Pt. Didn't return to his room and his wife didn't call back.  Also called [phone] which was listed as pt. Home number and left message, called cell number that was listed [phone].  But no answer, left a message again.  Forbes Cellar, RN

## 2013-02-07 ENCOUNTER — Encounter (HOSPITAL_COMMUNITY): Payer: Self-pay | Admitting: Internal Medicine

## 2013-02-07 NOTE — Care Management Note (Signed)
    Page 1 of 1   02/07/2013     2:41:05 PM   CARE MANAGEMENT NOTE 02/07/2013  Patient:  Thomas Sandoval, Thomas Sandoval   Account Number:  [id number]  Date Initiated:  02/07/2013  Documentation initiated by:  Letha Cape  Subjective/Objective Assessment:   dx intractable vomiting  admit- lives with wife.     Action/Plan:   Anticipated DC Date:  02/06/2013   Anticipated DC Plan:  HOME/SELF CARE      DC Planning Services  CM consult      Choice offered to / List presented to:             Status of service:  Completed, signed off Medicare Important Message given?   (If response is "NO", the following Medicare IM given date fields will be blank) Date Medicare IM given:   Date Additional Medicare IM given:    Discharge Disposition:  HOME/SELF CARE  Per UR Regulation:  Reviewed for med. necessity/level of care/duration of stay  If discussed at Long Length of Stay Meetings, dates discussed:    Comments:  02/07/13 14:39 Letha Cape RN BSN [phone] patient lives with spouse, no needs at dc, no NCM referal.

## 2013-02-08 ENCOUNTER — Encounter: Payer: Self-pay | Admitting: Internal Medicine

## 2013-02-10 ENCOUNTER — Ambulatory Visit (INDEPENDENT_AMBULATORY_CARE_PROVIDER_SITE_OTHER): Payer: BC Managed Care – PPO | Admitting: Physician Assistant

## 2013-02-10 ENCOUNTER — Encounter: Payer: Self-pay | Admitting: Physician Assistant

## 2013-02-10 VITALS — BP 117/62 | HR 89 | Temp 98.4°F | Wt 156.6 lb

## 2013-02-10 DIAGNOSIS — J441 Chronic obstructive pulmonary disease with (acute) exacerbation: Secondary | ICD-10-CM

## 2013-02-10 MED ORDER — AMOXICILLIN-POT CLAVULANATE 875-125 MG PO TABS: 1.0000 | ORAL_TABLET | Freq: Two times a day (BID) | ORAL | Status: DC

## 2013-02-10 MED ORDER — ALBUTEROL SULFATE HFA 108 (90 BASE) MCG/ACT IN AERS: 2.0000 | INHALATION_SPRAY | Freq: Four times a day (QID) | RESPIRATORY_TRACT | Status: DC | PRN

## 2013-02-10 NOTE — Patient Instructions (Signed)
Use inhalers as directed. Add albuterol every 6 hours when you have trouble breathing. Begin using antibiotics. Call orthopedics to schedule appt for back.

## 2013-02-15 NOTE — Progress Notes (Signed)
   Subjective:    Patient ID: Thomas Sandoval, male    DOB: 08/12/1955, 57 y.o.   MRN: [id number]  HPI 57 y/o male w/ h/o COPD presents s/p dx of URI on 02/04/13. He was tx w/ Bactrim and went to the ER with allergic rxn this past weekend. He is still having symptoms and would like a different antibiotic prescribed. He also has chronic back pain and wants his pain medicine refilled.      Review of Systems  Constitutional: Positive for chills, activity change and fatigue. Negative for fever, diaphoresis, appetite change and unexpected weight change.  HENT: Positive for congestion. Negative for dental problem, drooling, ear discharge, ear pain, facial swelling, hearing loss, mouth sores, nosebleeds, postnasal drip, rhinorrhea, sinus pressure, sneezing, sore throat, tinnitus, trouble swallowing and voice change.   Eyes: Negative.   Respiratory: Positive for cough, chest tightness, shortness of breath and wheezing. Negative for apnea, choking and stridor.   Cardiovascular: Negative.   Gastrointestinal: Negative.   Endocrine: Negative.   Genitourinary: Negative.        Objective:   Physical Exam  Vitals reviewed. Constitutional: He is oriented to person, place, and time. He appears well-developed and well-nourished. No distress.  HENT:  Head: Normocephalic and atraumatic.  Right Ear: External ear normal.  Left Ear: External ear normal.  Nose: Nose normal.  Mouth/Throat: Oropharynx is clear and moist. No oropharyngeal exudate.  Eyes: Right eye exhibits no discharge. Left eye exhibits no discharge.  Neck: Normal range of motion. Neck supple.  Cardiovascular: Normal rate, regular rhythm and normal heart sounds.  Exam reveals no gallop and no friction rub.   No murmur heard. Pulmonary/Chest: Effort normal. No stridor. No respiratory distress. He has wheezes (mild expiratory wheezing and decreased bs at bilateral bases. ). He has no rales.  Lymphadenopathy:    He has no cervical  adenopathy.  Neurological: He is alert and oriented to person, place, and time.  Skin: He is not diaphoretic.  Psychiatric: He has a normal mood and affect. His behavior is normal. Judgment and thought content normal.          Assessment & Plan:  1. COPD exacerbation: Patient has allergic reaction to first line treatment for respiratory infection. Prescribed Augmentin 875mg  BID x 10 days. Patient has not been using prescribed inhalers as directed and was encouraged to do so. Refills given for Albuterol to use q 4-6 hrs for additional support.   2. Chronic back pain d/t DDD: Patient states that he was dx with DDD from [facility] and was prescribed Hydrocodone. He cannot take NSAIDS, which would be first line treatment , so I instructed him to call them to schedule an appt for f/u and further pain medications. I will not prescribe them at today's visit.   Patient should rtc if s/s do not improve or worsen.

## 2013-02-19 ENCOUNTER — Other Ambulatory Visit: Payer: Self-pay | Admitting: Nurse Practitioner

## 2013-03-09 ENCOUNTER — Other Ambulatory Visit: Payer: Self-pay | Admitting: Nurse Practitioner

## 2013-03-10 IMAGING — CR DG CHEST 2V
4 series · 4 of 4 positions shown · non-contrast
Comparison: 04/27/2009

CLINICAL DATA: Cough and fever.  Shortness of breath.  Nausea
vomiting.

CHEST - 2 VIEW

[x chest ap (1 of 2)]
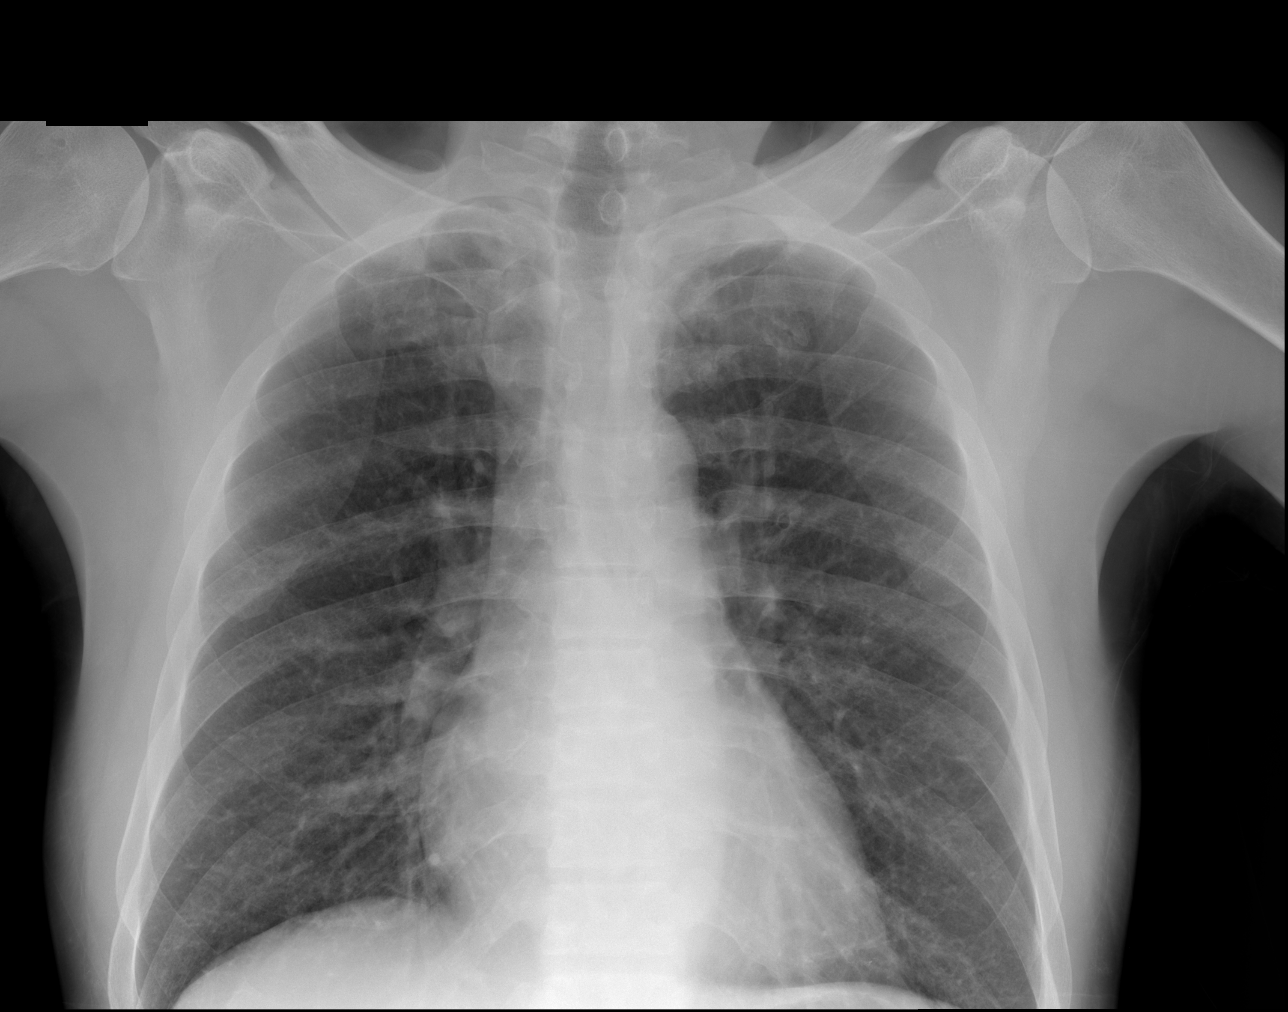

[x chest ap (2 of 2)]
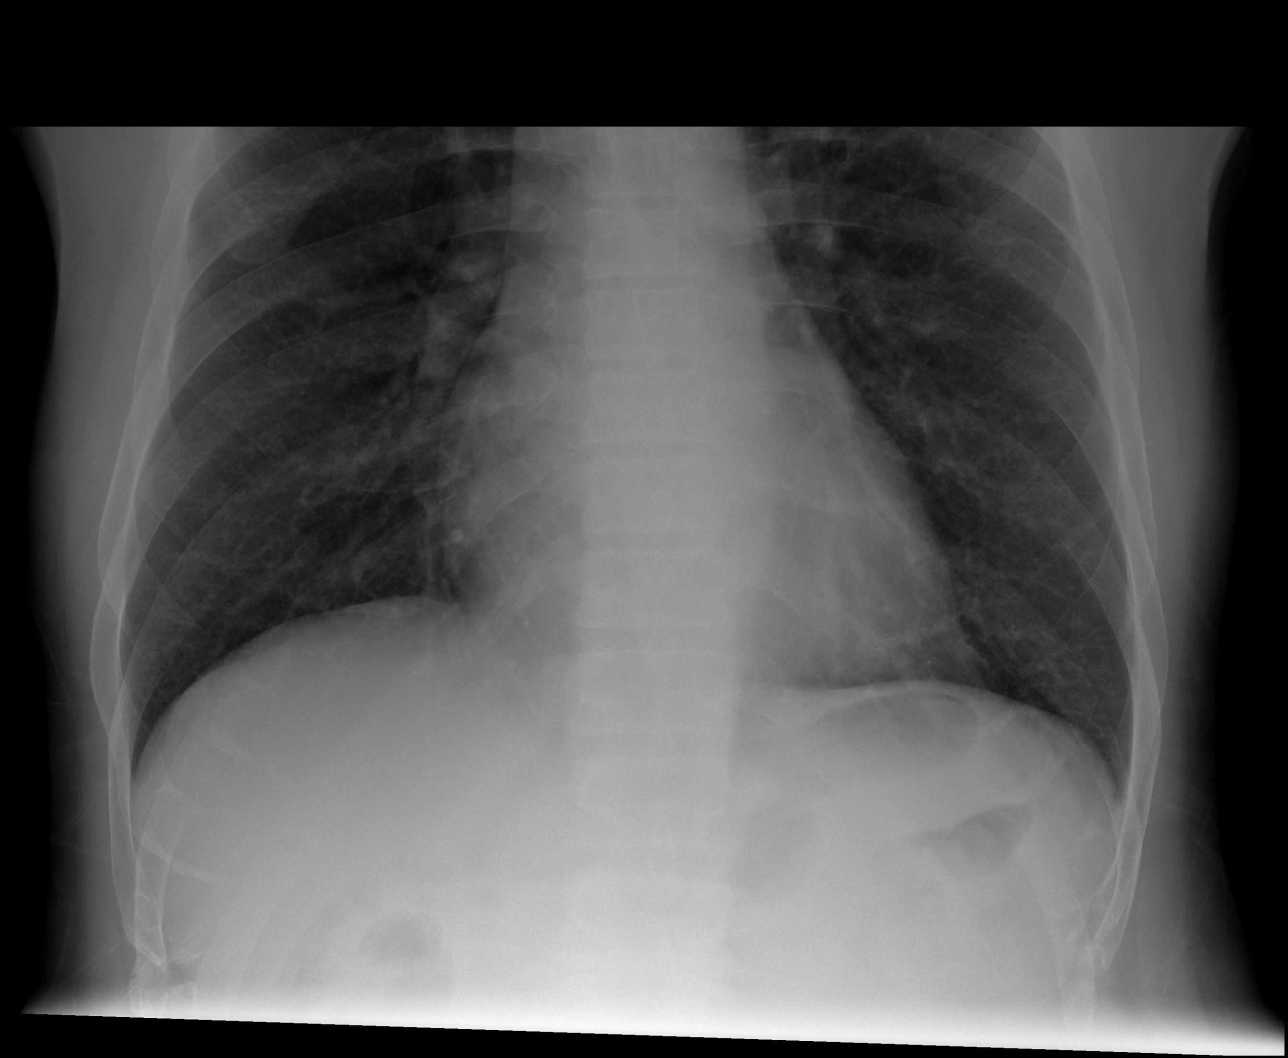

[w chest lat (1 of 2)]
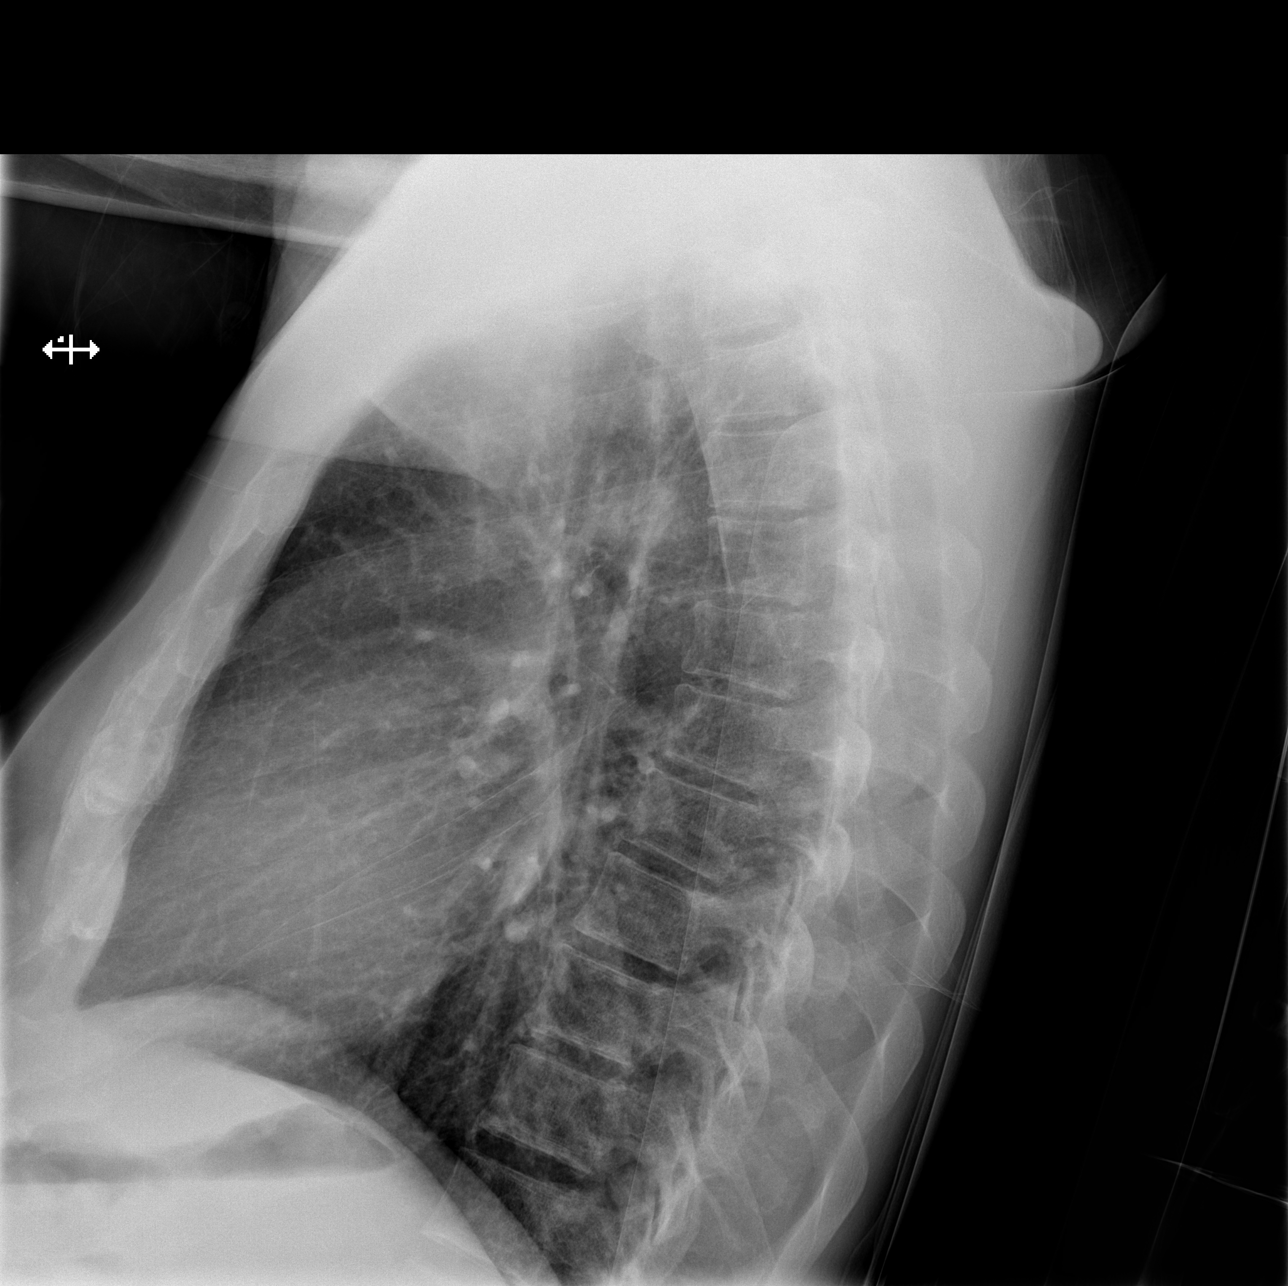

[w chest lat (2 of 2)]
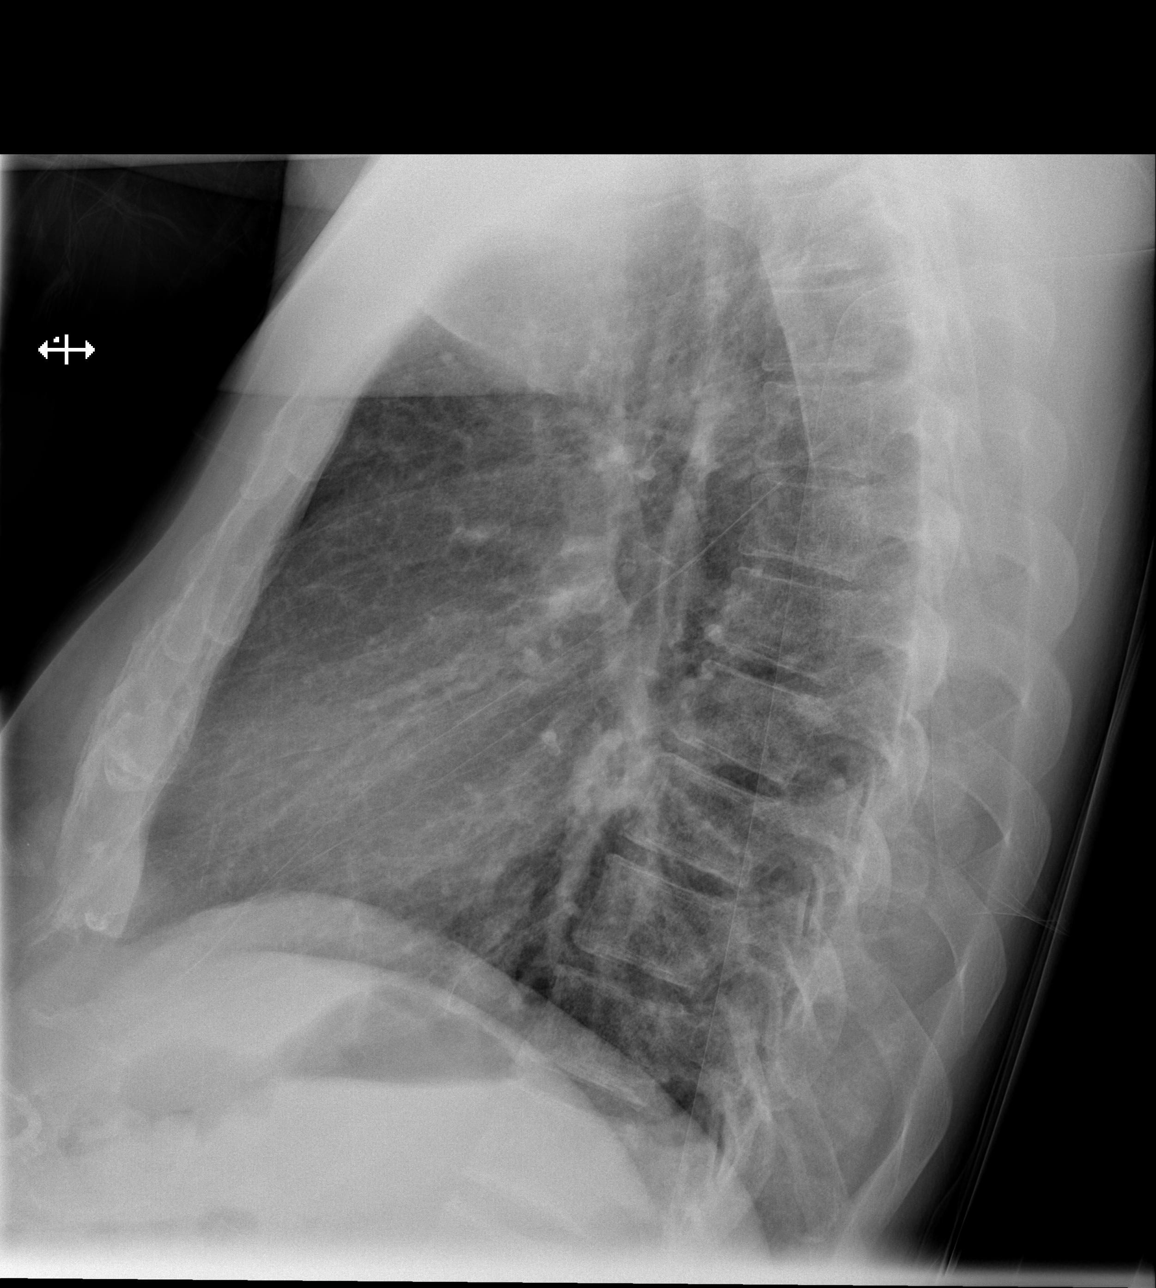

[4 of 4 positions shown; findings below may reference images not displayed]

FINDINGS: The heart size and mediastinal contours are within
normal limits.  Both lungs are clear.  The visualized skeletal
structures are unremarkable.
IMPRESSION: No active cardiopulmonary disease.

## 2013-04-06 ENCOUNTER — Other Ambulatory Visit: Payer: Self-pay | Admitting: Physician Assistant

## 2013-04-21 ENCOUNTER — Encounter: Payer: Self-pay | Admitting: Family Medicine

## 2013-04-21 ENCOUNTER — Ambulatory Visit (INDEPENDENT_AMBULATORY_CARE_PROVIDER_SITE_OTHER): Payer: BC Managed Care – PPO | Admitting: Family Medicine

## 2013-04-21 ENCOUNTER — Telehealth: Payer: Self-pay | Admitting: Nurse Practitioner

## 2013-04-21 VITALS — BP 163/61 | HR 77 | Temp 98.1°F | Ht 69.0 in | Wt 162.0 lb

## 2013-04-21 DIAGNOSIS — R319 Hematuria, unspecified: Secondary | ICD-10-CM

## 2013-04-21 LAB — POCT UA - MICROSCOPIC ONLY
Bacteria, U Microscopic: NEGATIVE
Casts, Ur, LPF, POC: NEGATIVE
Crystals, Ur, HPF, POC: NEGATIVE
Mucus, UA: NEGATIVE
WBC, Ur, HPF, POC: NEGATIVE
Yeast, UA: NEGATIVE

## 2013-04-21 LAB — POCT URINALYSIS DIPSTICK
Bilirubin, UA: NEGATIVE
Glucose, UA: NEGATIVE
Ketones, UA: NEGATIVE
Leukocytes, UA: NEGATIVE
Nitrite, UA: NEGATIVE
Protein, UA: NEGATIVE
Spec Grav, UA: <=1.005
Urobilinogen, UA: NEGATIVE
pH, UA: 6.5

## 2013-04-21 MED ORDER — CEPHALEXIN 500 MG PO CAPS: 500.0000 mg | ORAL_CAPSULE | Freq: Three times a day (TID) | ORAL | Status: DC

## 2013-04-21 NOTE — Progress Notes (Signed)
   Subjective:    Patient ID: Thomas Sandoval, male    DOB: 04-Mar-1956, 58 y.o.   MRN: [id number]  HPI This 58 y.o. male presents for evaluation of hematuria.  Patient states he has had this problem For 2 years and he was recently seeing a urologist and quit seeing the urologist but called and is Supposed to be getting an appointment to see another urologist in the same practice.  He states he Takes keflex and this helps slow down his hematuria.  He states he drinks 2 gallons of water a day. He denies having any urinary retention.   Review of Systems C/o hematuria No chest pain, SOB, HA, dizziness, vision change, N/V, diarrhea, constipation, dysuria, urinary urgency or frequency, myalgias, arthralgias or rash.     Objective:   Physical Exam  Vital signs noted  Well developed well nourished male.  HEENT - Head atraumatic Normocephalic                Eyes - PERRLA, Conjuctiva - clear Sclera- Clear EOMI                Ears - EAC's Wnl TM's Wnl Gross Hearing WNL                Nose - Nares patent                 Throat - oropharanx wnl Respiratory - Lungs CTA bilateral Cardiac - RRR S1 and S2 without murmur GI - Abdomen soft Nontender and bowel sounds active x 4 Extremities - No edema. Neuro - Grossly intact.      Results for orders placed in visit on 04/21/13  POCT URINALYSIS DIPSTICK      Result Value Ref Range   Color, UA red     Clarity, UA clear     Glucose, UA neg     Bilirubin, UA neg     Ketones, UA neg     Spec Grav, UA <=1.005     Blood, UA large     pH, UA 6.5     Protein, UA neg     Urobilinogen, UA negative     Nitrite, UA neg     Leukocytes, UA Negative    POCT UA - MICROSCOPIC ONLY      Result Value Ref Range   WBC, Ur, HPF, POC neg     RBC, urine, microscopic 25-30     Bacteria, U Microscopic neg     Mucus, UA neg     Epithelial cells, urine per micros occ     Crystals, Ur, HPF, POC neg     Casts, Ur, LPF, POC neg     Yeast, UA neg      Assessment & Plan:  .Hematuria - Plan: POCT urinalysis dipstick, POCT UA - Microscopic Only Keflex 500mg  one po tid x 20 days #60 Follow up with urology If having any urinary retention go to the ED or follow up prn  Lysbeth Penner FNP

## 2013-04-21 NOTE — Telephone Encounter (Signed)
appt today at 3:45

## 2013-05-25 ENCOUNTER — Other Ambulatory Visit: Payer: Self-pay | Admitting: Physician Assistant

## 2013-06-06 ENCOUNTER — Other Ambulatory Visit: Payer: Self-pay | Admitting: Urology

## 2013-06-06 ENCOUNTER — Other Ambulatory Visit: Payer: Self-pay | Admitting: *Deleted

## 2013-06-06 ENCOUNTER — Encounter (HOSPITAL_BASED_OUTPATIENT_CLINIC_OR_DEPARTMENT_OTHER): Payer: Self-pay | Admitting: *Deleted

## 2013-06-06 MED ORDER — FLUTICASONE-SALMETEROL 250-50 MCG/DOSE IN AEPB: 1.0000 | INHALATION_SPRAY | Freq: Two times a day (BID) | RESPIRATORY_TRACT | Status: DC

## 2013-06-07 ENCOUNTER — Encounter (HOSPITAL_BASED_OUTPATIENT_CLINIC_OR_DEPARTMENT_OTHER): Payer: Self-pay | Admitting: *Deleted

## 2013-06-07 NOTE — Progress Notes (Signed)
NPO AFTER MN. ARRIVE AT 0900. PT WORRIED ABOUT SUGAR, CBG, ADVISED PT TO CALL FRONT DESK AND/OR COME EARLIER THAN 0900. HE WILL NOT DO INSULIN AM DOS.  WILL TAKE PEPCID AND DO BROMIDE INHALER AM DOS W/ SIPS OF WATER.  NEEDS ISTAT. CURRENT EKG  AND CXR IN CHART AND EPIC.

## 2013-06-12 ENCOUNTER — Encounter (HOSPITAL_BASED_OUTPATIENT_CLINIC_OR_DEPARTMENT_OTHER): Payer: Self-pay

## 2013-06-12 ENCOUNTER — Encounter (HOSPITAL_BASED_OUTPATIENT_CLINIC_OR_DEPARTMENT_OTHER): Payer: BC Managed Care – PPO | Admitting: Anesthesiology

## 2013-06-12 ENCOUNTER — Ambulatory Visit (HOSPITAL_BASED_OUTPATIENT_CLINIC_OR_DEPARTMENT_OTHER): Payer: BC Managed Care – PPO | Admitting: Anesthesiology

## 2013-06-12 ENCOUNTER — Ambulatory Visit (HOSPITAL_BASED_OUTPATIENT_CLINIC_OR_DEPARTMENT_OTHER)
Admission: RE | Admit: 2013-06-12 | Discharge: 2013-06-12 | Disposition: A | Discharge status: Home / Self Care | Payer: BC Managed Care – PPO | Source: Ambulatory Visit | Attending: Urology | Admitting: Urology

## 2013-06-12 ENCOUNTER — Encounter (HOSPITAL_BASED_OUTPATIENT_CLINIC_OR_DEPARTMENT_OTHER)
Admission: RE | Disposition: A | Discharge status: Home / Self Care | Payer: Self-pay | Source: Ambulatory Visit | Attending: Urology

## 2013-06-12 DIAGNOSIS — D494 Neoplasm of unspecified behavior of bladder: Secondary | ICD-10-CM

## 2013-06-12 DIAGNOSIS — E109 Type 1 diabetes mellitus without complications: Secondary | ICD-10-CM | POA: Insufficient documentation

## 2013-06-12 DIAGNOSIS — F17200 Nicotine dependence, unspecified, uncomplicated: Secondary | ICD-10-CM | POA: Insufficient documentation

## 2013-06-12 DIAGNOSIS — K219 Gastro-esophageal reflux disease without esophagitis: Secondary | ICD-10-CM | POA: Insufficient documentation

## 2013-06-12 DIAGNOSIS — J4489 Other specified chronic obstructive pulmonary disease: Secondary | ICD-10-CM | POA: Insufficient documentation

## 2013-06-12 DIAGNOSIS — J449 Chronic obstructive pulmonary disease, unspecified: Secondary | ICD-10-CM | POA: Insufficient documentation

## 2013-06-12 DIAGNOSIS — Z794 Long term (current) use of insulin: Secondary | ICD-10-CM | POA: Insufficient documentation

## 2013-06-12 DIAGNOSIS — C676 Malignant neoplasm of ureteric orifice: Secondary | ICD-10-CM | POA: Insufficient documentation

## 2013-06-12 DIAGNOSIS — R310 Gross hematuria: Secondary | ICD-10-CM | POA: Insufficient documentation

## 2013-06-12 DIAGNOSIS — R339 Retention of urine, unspecified: Secondary | ICD-10-CM | POA: Insufficient documentation

## 2013-06-12 DIAGNOSIS — Z79899 Other long term (current) drug therapy: Secondary | ICD-10-CM | POA: Insufficient documentation

## 2013-06-12 DIAGNOSIS — D649 Anemia, unspecified: Secondary | ICD-10-CM | POA: Insufficient documentation

## 2013-06-12 HISTORY — DX: Unspecified osteoarthritis, unspecified site: M19.90

## 2013-06-12 HISTORY — DX: Other chronic pain: G89.29

## 2013-06-12 HISTORY — DX: Personal history of other diseases of the respiratory system: Z87.09

## 2013-06-12 HISTORY — DX: Reserved for concepts with insufficient information to code with codable children: IMO0002

## 2013-06-12 HISTORY — DX: Personal history of diseases of the blood and blood-forming organs and certain disorders involving the immune mechanism: Z86.2

## 2013-06-12 HISTORY — DX: Other specified cough: R05.8

## 2013-06-12 HISTORY — DX: Type 2 diabetes mellitus with unspecified diabetic retinopathy without macular edema: E11.319

## 2013-06-12 HISTORY — DX: Personal history of other diseases of the circulatory system: Z86.79

## 2013-06-12 HISTORY — DX: Unspecified hearing loss, unspecified ear: H91.90

## 2013-06-12 HISTORY — DX: Dorsalgia, unspecified: M54.9

## 2013-06-12 HISTORY — DX: Chronic obstructive pulmonary disease, unspecified: J44.9

## 2013-06-12 HISTORY — DX: Cough: R05

## 2013-06-12 HISTORY — DX: Simple chronic bronchitis: J41.0

## 2013-06-12 HISTORY — PX: TRANSURETHRAL RESECTION OF BLADDER TUMOR WITH GYRUS (TURBT-GYRUS): SHX6458

## 2013-06-12 LAB — POCT I-STAT 4, (NA,K, GLUC, HGB,HCT)
Glucose, Bld: 49 mg/dL — ABNORMAL LOW (ref 70–99)
HCT: 42 % (ref 39.0–52.0)
Hemoglobin: 14.3 g/dL (ref 13.0–17.0)
Potassium: 3.8 meq/L (ref 3.7–5.3)
Sodium: 143 meq/L (ref 137–147)

## 2013-06-12 LAB — GLUCOSE, CAPILLARY
Glucose-Capillary: 122 mg/dL — ABNORMAL HIGH (ref 70–99)
Glucose-Capillary: 47 mg/dL — ABNORMAL LOW (ref 70–99)
Glucose-Capillary: 53 mg/dL — ABNORMAL LOW (ref 70–99)
Glucose-Capillary: 107 mg/dL — ABNORMAL HIGH (ref 70–99)

## 2013-06-12 SURGERY — TRANSURETHRAL RESECTION OF BLADDER TUMOR WITH GYRUS (TURBT-GYRUS)
Anesthesia: General | Site: Bladder

## 2013-06-12 MED ORDER — FENTANYL CITRATE 0.05 MG/ML IJ SOLN
INTRAMUSCULAR | Status: AC
  Filled 2013-06-12: qty 2

## 2013-06-12 MED ORDER — BELLADONNA ALKALOIDS-OPIUM 16.2-60 MG RE SUPP
RECTAL | Status: AC
  Filled 2013-06-12: qty 1

## 2013-06-12 MED ORDER — CEFAZOLIN SODIUM-DEXTROSE 2-3 GM-% IV SOLR
2.0000 g | INTRAVENOUS | Status: AC
Start: 2013-06-12 — End: 2013-06-12
  Administered 2013-06-12: 2 g via INTRAVENOUS
  Filled 2013-06-12: qty 50

## 2013-06-12 MED ORDER — PROMETHAZINE HCL 25 MG/ML IJ SOLN
6.2500 mg | INTRAMUSCULAR | Status: DC | PRN
  Filled 2013-06-12: qty 1

## 2013-06-12 MED ORDER — SODIUM CHLORIDE 0.9 % IR SOLN
Status: DC | PRN
  Administered 2013-06-12: 15000 mL

## 2013-06-12 MED ORDER — OXYCODONE-ACETAMINOPHEN 10-325 MG PO TABS: 1.0000 | ORAL_TABLET | ORAL | Status: DC | PRN

## 2013-06-12 MED ORDER — DEXTROSE 50 % IV SOLN
25.0000 mL | Freq: Once | INTRAVENOUS | Status: AC
  Administered 2013-06-12: 25 mL via INTRAVENOUS
  Filled 2013-06-12: qty 50

## 2013-06-12 MED ORDER — MIDAZOLAM HCL 5 MG/5ML IJ SOLN
INTRAMUSCULAR | Status: DC | PRN
  Administered 2013-06-12: 2 mg via INTRAVENOUS

## 2013-06-12 MED ORDER — PROPOFOL 10 MG/ML IV BOLUS
INTRAVENOUS | Status: DC | PRN
  Administered 2013-06-12: 200 mg via INTRAVENOUS

## 2013-06-12 MED ORDER — HYDROMORPHONE HCL PF 1 MG/ML IJ SOLN
1.0000 mg | INTRAMUSCULAR | Status: DC | PRN
  Administered 2013-06-12: 1 mg via INTRAVENOUS
  Filled 2013-06-12: qty 1

## 2013-06-12 MED ORDER — DEXTROSE 50 % IV SOLN
INTRAVENOUS | Status: DC | PRN
  Administered 2013-06-12: 25 mL via INTRAVENOUS

## 2013-06-12 MED ORDER — ONDANSETRON HCL 4 MG/2ML IJ SOLN
INTRAMUSCULAR | Status: DC | PRN
  Administered 2013-06-12: 4 mg via INTRAVENOUS

## 2013-06-12 MED ORDER — FENTANYL CITRATE 0.05 MG/ML IJ SOLN
INTRAMUSCULAR | Status: DC | PRN
  Administered 2013-06-12: 100 ug via INTRAVENOUS

## 2013-06-12 MED ORDER — FENTANYL CITRATE 0.05 MG/ML IJ SOLN
25.0000 ug | INTRAMUSCULAR | Status: DC | PRN
  Administered 2013-06-12 (×2): 25 ug via INTRAVENOUS
  Filled 2013-06-12: qty 1

## 2013-06-12 MED ORDER — LACTATED RINGERS IV SOLN
INTRAVENOUS | Status: DC
  Filled 2013-06-12: qty 1000

## 2013-06-12 MED ORDER — PHENAZOPYRIDINE HCL 200 MG PO TABS: 200.0000 mg | ORAL_TABLET | Freq: Three times a day (TID) | ORAL | Status: DC | PRN

## 2013-06-12 MED ORDER — METHYLENE BLUE 1 % INJ SOLN
INTRAMUSCULAR | Status: DC | PRN
  Administered 2013-06-12 (×2): 2.5 mL via INTRAVENOUS

## 2013-06-12 MED ORDER — LACTATED RINGERS IV SOLN
INTRAVENOUS | Status: DC
  Administered 2013-06-12 (×2): via INTRAVENOUS
  Filled 2013-06-12: qty 1000

## 2013-06-12 MED ORDER — LIDOCAINE HCL 1 % IJ SOLN
INTRAMUSCULAR | Status: DC | PRN
  Administered 2013-06-12: 60 mg via INTRADERMAL

## 2013-06-12 MED ORDER — MIDAZOLAM HCL 2 MG/2ML IJ SOLN
INTRAMUSCULAR | Status: AC
  Filled 2013-06-12: qty 2

## 2013-06-12 MED ORDER — FENTANYL CITRATE 0.05 MG/ML IJ SOLN
INTRAMUSCULAR | Status: AC
Start: 2013-06-12 — End: 2013-06-12
  Filled 2013-06-12: qty 4

## 2013-06-12 MED ORDER — HYDROMORPHONE HCL PF 1 MG/ML IJ SOLN
INTRAMUSCULAR | Status: AC
  Filled 2013-06-12: qty 1

## 2013-06-12 SURGICAL SUPPLY — 38 items
BAG DRAIN URO-CYSTO SKYTR STRL (DRAIN) ×2 IMPLANT
BAG DRN ANRFLXCHMBR STRAP LEK (BAG)
BAG DRN UROCATH (DRAIN) ×1
BAG URINE DRAINAGE (UROLOGICAL SUPPLIES) ×2 IMPLANT
BAG URINE LEG 19OZ MD ST LTX (BAG) IMPLANT
BLADE SURG 15 STRL LF DISP TIS (BLADE) IMPLANT
BLADE SURG 15 STRL SS (BLADE)
CANISTER SUCT LVC 12 LTR MEDI- (MISCELLANEOUS) ×2 IMPLANT
CATH FOLEY 2WAY SLVR  5CC 20FR (CATHETERS) ×1
CATH FOLEY 2WAY SLVR 5CC 20FR (CATHETERS) IMPLANT
CATH FOLEY 3WAY 20FR (CATHETERS) IMPLANT
CATH FOLEY 3WAY 30CC 22F (CATHETERS) ×1 IMPLANT
CATH INTERMIT  6FR 70CM (CATHETERS) ×1 IMPLANT
CLOTH BEACON ORANGE TIMEOUT ST (SAFETY) ×2 IMPLANT
DRAPE CAMERA CLOSED 9X96 (DRAPES) ×2 IMPLANT
ELECT BUTTON BIOP 24F 90D PLAS (MISCELLANEOUS) IMPLANT
ELECT LOOP HF 26F 30D .35MM (CUTTING LOOP) IMPLANT
ELECT LOOP MED HF 24F 12D CBL (CLIP) ×1 IMPLANT
ELECT REM PT RETURN 9FT ADLT (ELECTROSURGICAL) ×2
ELECTRODE REM PT RTRN 9FT ADLT (ELECTROSURGICAL) ×1 IMPLANT
EVACUATOR MICROVAS BLADDER (UROLOGICAL SUPPLIES) ×1 IMPLANT
GLOVE BIO SURGEON STRL SZ7.5 (GLOVE) ×2 IMPLANT
GLOVE SURG SS PI 7.5 STRL IVOR (GLOVE) ×2 IMPLANT
GOWN STRL REIN XL XLG (GOWN DISPOSABLE) ×1 IMPLANT
GOWN STRL REUS W/TWL XL LVL3 (GOWN DISPOSABLE) ×2 IMPLANT
HOLDER FOLEY CATH W/STRAP (MISCELLANEOUS) ×1 IMPLANT
IV NS IRRIG 3000ML ARTHROMATIC (IV SOLUTION) ×5 IMPLANT
KIT SUPRAPUBIC CATH (MISCELLANEOUS) IMPLANT
LOOP CUTTING 24FR OLYMPUS (CUTTING LOOP) IMPLANT
NS IRRIG 500ML POUR BTL (IV SOLUTION) ×2 IMPLANT
PACK CYSTOSCOPY (CUSTOM PROCEDURE TRAY) ×2 IMPLANT
PLUG CATH AND CAP STER (CATHETERS) IMPLANT
SET ASPIRATION TUBING (TUBING) ×1 IMPLANT
SUT ETHILON 3 0 PS 1 (SUTURE) IMPLANT
SYR 30ML LL (SYRINGE) ×1 IMPLANT
SYR BULB IRRIGATION 50ML (SYRINGE) ×1 IMPLANT
WATER STERILE IRR 3000ML UROMA (IV SOLUTION) IMPLANT
WATER STERILE IRR 500ML POUR (IV SOLUTION) ×1 IMPLANT

## 2013-06-12 NOTE — Discharge Instructions (Signed)
Transurethral Resection of Bladder Tumor (TURBT)   Definition:  Transurethral Resection of the Bladder Tumor is a surgical procedure used to diagnose and remove tumors within the bladder. TURBT is the most common treatment for early stage bladder cancer.  General instructions:     Your recent bladder surgery requires very little post hospital care but some definite precautions.  Despite the fact that no skin incisions were used, the area around the bladder incisions are raw and covered with scabs to promote healing and prevent bleeding. Certain precautions are needed to insure that the scabs are not disturbed over the next 2-4 weeks while the healing proceeds.  Because the raw surface inside your bladder and the irritating effects of urine you may expect frequency of urination and/or urgency (a stronger desire to urinate) and perhaps even getting up at night more often. This will usually resolve or improve slowly over the healing period. You may see some blood in your urine over the first 6 weeks. Do not be alarmed, even if the urine was clear for a while. Get off your feet and drink lots of fluids until clearing occurs. If you start to pass clots or don't improve call us.  Catheter: (If you are discharged with a catheter.)  1. Keep your catheter secured to your leg at all times with tape or the supplied strap. 2. You may experience leakage of urine around your catheter- as long as the  catheter continues to drain, this is normal.  If your catheter stops draining  go to the ER. 3. You may also have blood in your urine, even after it has been clear for  several days; you may even pass some small blood clots or other material.  This  is normal as well.  If this happens, sit down and drink plenty of water to help  make urine to flush out your bladder.  If the blood in your urine becomes worse  after doing this, contact our office or return to the ER. 4. You may use the leg bag (small bag)  during the day, but use the large bag at  night.  Diet:  You may return to your normal diet immediately. Because of the raw surface of your bladder, alcohol, spicy foods, foods high in acid and drinks with caffeine may cause irritation or frequency and should be used in moderation. To keep your urine flowing freely and avoid constipation, drink plenty of fluids during the day (8-10 glasses). Tip: Avoid cranberry juice because it is very acidic.  Activity:  Your physical activity doesn't need to be restricted. However, if you are very active, you may see some blood in the urine. We suggest that you reduce your activity under the circumstances until the bleeding has stopped.  Bowels:  It is important to keep your bowels regular during the postoperative period. Straining with bowel movements can cause bleeding. A bowel movement every other day is reasonable. Use a mild laxative if needed, such as milk of magnesia 2-3 tablespoons, or 2 Dulcolax tablets. Call if you continue to have problems. If you had been taking narcotics for pain, before, during or after your surgery, you may be constipated. Take a laxative if necessary.    Medication:  You should resume your pre-surgery medications unless told not to. In addition you may be given an antibiotic to prevent or treat infection. Antibiotics are not always necessary. All medication should be taken as prescribed until the bottles are finished unless you are having  an unusual reaction to one of the drugs.    Post Anesthesia Home Care Instructions  Activity: Get plenty of rest for the remainder of the day. A responsible adult should stay with you for 24 hours following the procedure.  For the next 24 hours, DO NOT: -Drive a car -Operate machinery -Drink alcoholic beverages -Take any medication unless instructed by your physician -Make any legal decisions or sign important papers.  Meals: Start with liquid foods such as gelatin or soup.  Progress to regular foods as tolerated. Avoid greasy, spicy, heavy foods. If nausea and/or vomiting occur, drink only clear liquids until the nausea and/or vomiting subsides. Call your physician if vomiting continues.  Special Instructions/Symptoms: Your throat may feel dry or sore from the anesthesia or the breathing tube placed in your throat during surgery. If this causes discomfort, gargle with warm salt water. The discomfort should disappear within 24 hours.        

## 2013-06-12 NOTE — Anesthesia Preprocedure Evaluation (Addendum)
Anesthesia Evaluation  Patient identified by MRN, date of birth, ID band Patient awake    Reviewed: Allergy & Precautions, H&P , NPO status , Patient's Chart, lab work & pertinent test results  Airway Mallampati: II TM Distance: >3 FB Neck ROM: Full    Dental  (+) Teeth Intact, Dental Advisory Given   Pulmonary pneumonia -, COPDCurrent Smoker,  breath sounds clear to auscultation  Pulmonary exam normal       Cardiovascular + dysrhythmias Atrial Fibrillation Rhythm:Regular Rate:Normal     Neuro/Psych negative neurological ROS  negative psych ROS   GI/Hepatic Neg liver ROS, GERD-  Medicated,  Endo/Other  diabetes, Type 1, Insulin Dependent  Renal/GU negative Renal ROS  negative genitourinary   Musculoskeletal negative musculoskeletal ROS (+)   Abdominal   Peds negative pediatric ROS (+)  Hematology  (+) anemia ,   Anesthesia Other Findings   Reproductive/Obstetrics                          Anesthesia Physical Anesthesia Plan  ASA: III  Anesthesia Plan: General   Post-op Pain Management:    Induction: Intravenous  Airway Management Planned: LMA  Additional Equipment:   Intra-op Plan:   Post-operative Plan: Extubation in OR  Informed Consent: I have reviewed the patients History and Physical, chart, labs and discussed the procedure including the risks, benefits and alternatives for the proposed anesthesia with the patient or authorized representative who has indicated his/her understanding and acceptance.   Dental advisory given  Plan Discussed with: CRNA  Anesthesia Plan Comments:         Anesthesia Quick Evaluation

## 2013-06-12 NOTE — H&P (Signed)
Thomas Sandoval is a 58 year old male who is a heavy smoker and has been diagnosed with a bladder tumor.  History of Present Illness Gross hematuria: He reported having seen blood in his urine on and off for 2 years. He smokes (2-3 packs per day for >30 years). CT scan 02/21/11 revealed a 2.4 centimeter mass in the bladder.    Incomplete bladder emptying: He gets up once at night and reports a good flow.  Uroflowmetry 04/05/13: He voided 181 mL with an interrupted flow pattern that was prolonged. Maximum flow rate was 6 mL/sec.   PVR: 409 cc.    Interval history: No new urologic complaints are noted. He's not seen any further gross hematuria.    Past Medical History Problems  1. History of diabetes mellitus (V12.29) 2. History of hematuria (V13.09)  Surgical History Problems  1. History of No Surgical Problems  Current Meds 1. Advair HFA AERO;  Therapy: (Recorded:16Feb2015) to Recorded 2. Lantus SOLN;  Therapy: (Recorded:28Jan2015) to Recorded 3. Motrin TABS;  Therapy: (Recorded:16Feb2015) to Recorded  Allergies Medication  1. Avelox TABS 2. Zithromax Z-Pak TABS 3. Bactrim TABS  Family History Problems  1. Family history of No acute medical problems   no family history provided on form. 2. No pertinent family history : Mother  Social History Problems  1. Denied: History of Alcohol use 2. Caffeine use (V49.89) 3. Current smoker (305.1) 4. Employed as farmer 5. Married 6. Three children   2 son and 1 daughter  Review of Systems Constitutional, skin, eye, otolaryngeal, hematologic/lymphatic, cardiovascular, pulmonary, endocrine, gastrointestinal, neurological and psychiatric system(s) were reviewed and pertinent findings if present are noted.  Genitourinary: hematuria.  Musculoskeletal: back pain.      Vitals Vital Signs  Height: 5 ft 9 in Weight: 168 lb  BMI Calculated: 24.81 BSA Calculated: 1.92 Blood Pressure: 144 / 64 Heart Rate:  74  Physical Exam Constitutional: Well nourished and well developed . No acute distress.   ENT:. The ears and nose are normal in appearance.   Neck: The appearance of the neck is normal and no neck mass is present.   Pulmonary: No respiratory distress and normal respiratory rhythm and effort.   Cardiovascular: Heart rate and rhythm are normal . No peripheral edema.   Lymphatics: The femoral and inguinal nodes are not enlarged or tender.   Skin: Normal skin turgor, no visible rash and no visible skin lesions.   Neuro/Psych:. Mood and affect are appropriate.   . Abdomen:   On abdominal examination, Thomas Sandoval was a little bit tender in the suprapubic area. I did not palpate his bladder in the standing position.   . Genitourinary:   Male genitalia normal; nontoxic; 40-50 gram benign prostate.  Assessment Assessed  I went over the results of my cystoscopic findings which have revealed a bladder mass consistent with transitional cell carcinoma. It is large and appears to be on a small stalk attached to the floor of the bladder on the right hand side. I could not easily visualize the ureteral orifice today but it looks like it is possibly lateral to the right UO. We discussed the fact that currently there is no evidence of extravesical extension or pelvic adenopathy based on CT scan findings. Further characterization of the lesion is required for grading and staging purposes. We discussed proceeding with evaluation using transurethral resection of the lesion. I have discussed the procedure in detail as well as the potential risks and complications associated with this form of  surgery. We also discussed the probability of successful resection of the intravesical portion of this lesion. I have recommended, as long as there is no contraindication at the time of surgery, the placement of intravesical mitomycin-C in order to reduce the risk of recurrence. We did discuss the potential side  effects of this form of intravesical chemotherapy. The procedure will be performed under anesthesia as an outpatient.    He does have a history of elevated PVR and possible outlet obstruction.   Plan  1. He will be scheduled for transurethral resection of his bladder tumor.  2. I tentatively plan to use postoperative mitomycin-C intravesically.

## 2013-06-12 NOTE — Op Note (Signed)
PATIENT:  Thomas Sandoval  PRE-OPERATIVE DIAGNOSIS: Bladder tumor  POST-OPERATIVE DIAGNOSIS: Same  PROCEDURE:  Procedure(s): TRANSURETHRAL RESECTION OF BLADDER TUMOR (TURBT) (5.5cm.)  SURGEON:  Surgeon(s): Claybon Jabs  ANESTHESIA:   General  EBL:  less than 50 mL  DRAINS: Urinary Catheter (20 Fr. Foley)   SPECIMEN:  Source of Specimen:  Bladder tumor  DISPOSITION OF SPECIMEN:  PATHOLOGY  Indication: Thomas Sandoval is a 58 year old male with a two-year history of intermittent gross hematuria. He smokes 2-3 packs of cigarettes per day and has done so for greater than 30 years. A CT scan on 02/21/11 revealed a 2.4 cm mass in the bladder. When I saw him I obtained a CT scan that revealed the mass had increased in size to over 3 cm on CT measurements. There was no evidence of extravesical extension or adenopathy. The upper tract appeared normal and there is no hydronephrosis. He was evaluated cystoscopically and the lesion was located on the right floor of the bladder. We discussed transurethral resection. He presents today for that surgery.  Description of operation: The patient was taken to the operating room and administered general anesthesia. He was then placed on the table and moved to the dorsal lithotomy position after which his genitalia was sterilely prepped and draped. An official timeout was then performed.  The 89 French resectoscope with visual obturator was then passed under direct vision into the bladder and the obturator was removed. The resectoscope element with 12 lens was then inserted and the bladder was fully and systematically inspected. Ureteral orifices were noted to be in the normal anatomic positions. The right ureteral orifice was noted to be involved with the base of the tumor and there appeared to possibly be small frondular areas in the area of the UO itself. The tumor had a stent was located posterior and slightly lateral to the right ureteral orifice. It was  much larger cystoscopically then measured by CT scan and appeared to measure between 5.5 and 6 cm. I saw no other tumors or worrisome lesions within the bladder. The bladder had 1-2+ trabeculation. The prostate was free of lesions.  I first began by resecting the tumor down to the wall of the bladder. I then was able to identify the right ureteral orifice and I felt that resecting a portion of that was necessary. In doing so I used pure cut and then did not fulgurate anywhere near the ureteral orifice. It was photographed after I had resected. It was widely patent.  Reinspection of the bladder revealed all obvious tumor had been fully resected and there was no evidence of perforation. The Microvasive evacuator was then used to irrigate the bladder and remove all of the portions of bladder tumor which were sent to pathology. There was a small area on the posterior wall of the bladder about 5 mm in size that I felt possibly could be a small perforation and therefore elected not to proceed with intravesical mitomycin-C. I then removed the resectoscope.  A 20 French Foley catheter was then inserted in the bladder and irrigated. The irrigant returned slightly pink with no clots. The patient was awakened and taken to the recovery room.  PLAN OF CARE: Discharge to home after PACU  PATIENT DISPOSITION:  PACU - hemodynamically stable.

## 2013-06-12 NOTE — Progress Notes (Signed)
Complaining of weakness and feeling like glucose low, glucose 49 reported to Dr. Winfred Leeds, will start IV and give 1/2 amp Dextrose.

## 2013-06-12 NOTE — Transfer of Care (Signed)
Immediate Anesthesia Transfer of Care Note  Patient: Thomas Sandoval  Procedure(s) Performed: Procedure(s): TRANSURETHRAL RESECTION OF BLADDER TUMOR WITH GYRUS (TURBT-GYRUS) (N/A)  Patient Location: PACU  Anesthesia Type:General  Level of Consciousness: oriented, sedated and patient cooperative  Airway & Oxygen Therapy: Patient Spontanous Breathing and Patient connected to face mask oxygen  Post-op Assessment: Report given to PACU RN and Post -op Vital signs reviewed and stable  Post vital signs: Reviewed and stable  Complications: No apparent anesthesia complications

## 2013-06-13 NOTE — Anesthesia Postprocedure Evaluation (Signed)
Anesthesia Post Note  Patient: Thomas Sandoval  Procedure(s) Performed: Procedure(s) (LRB): TRANSURETHRAL RESECTION OF BLADDER TUMOR WITH GYRUS (TURBT-GYRUS) (N/A)  Anesthesia type: General  Patient location: PACU  Post pain: Pain level controlled  Post assessment: Post-op Vital signs reviewed  Last Vitals:  Filed Vitals:   06/12/13 1245  BP: 111/49  Pulse: 54  Temp:   Resp: 14    Post vital signs: Reviewed  Level of consciousness: sedated  Complications: No apparent anesthesia complications

## 2013-06-14 ENCOUNTER — Ambulatory Visit (INDEPENDENT_AMBULATORY_CARE_PROVIDER_SITE_OTHER): Payer: BC Managed Care – PPO | Admitting: Nurse Practitioner

## 2013-06-14 ENCOUNTER — Encounter (HOSPITAL_BASED_OUTPATIENT_CLINIC_OR_DEPARTMENT_OTHER): Payer: Self-pay | Admitting: Urology

## 2013-06-14 VITALS — BP 126/65 | HR 91 | Temp 97.4°F | Ht 70.0 in | Wt 155.2 lb

## 2013-06-14 DIAGNOSIS — J4489 Other specified chronic obstructive pulmonary disease: Secondary | ICD-10-CM

## 2013-06-14 DIAGNOSIS — J449 Chronic obstructive pulmonary disease, unspecified: Secondary | ICD-10-CM

## 2013-06-14 MED ORDER — METHYLPREDNISOLONE ACETATE 80 MG/ML IJ SUSP: 80.0000 mg | Freq: Once | INTRAMUSCULAR | Status: DC

## 2013-06-14 NOTE — Progress Notes (Signed)
   Subjective:    Patient ID: Thomas Sandoval, male    DOB: [birth year], 58 y.o.   MRN: [id number]  HPI Patient in c/o cough and congestion- he has a long history of COPD and uses advair in AM and dulera at night.     Review of Systems  Constitutional: Negative.   HENT: Positive for congestion and rhinorrhea.   Eyes: Negative for redness.  Respiratory: Positive for cough and shortness of breath.   Gastrointestinal: Negative.   Genitourinary: Negative.   Musculoskeletal: Negative.   Psychiatric/Behavioral: Negative.   All other systems reviewed and are negative.      Objective:   Physical Exam  Constitutional: He appears well-developed and well-nourished.  HENT:  Right Ear: External ear normal.  Left Ear: External ear normal.  Nose: Nose normal.  Mouth/Throat: Oropharynx is clear and moist.  Eyes: Pupils are equal, round, and reactive to light.  Neck: Normal range of motion. Neck supple.  Cardiovascular: Normal rate, regular rhythm and normal heart sounds.   Pulmonary/Chest: Effort normal. He has wheezes (exp wheezes throughout).  Neurological: He is alert.  Skin: Skin is warm and dry.  Psychiatric: He has a normal mood and affect. His behavior is normal. Judgment and thought content normal.    BP 126/65  Pulse 91  Temp(Src) 97.4 F (36.3 C) (Oral)  Ht 5\' 10"  (1.778 m)  Wt 155 lb 3.2 oz (70.398 kg)  BMI 22.27 kg/m2       Assessment & Plan:   1. COPD (chronic obstructive pulmonary disease)    Meds ordered this encounter  Medications  . mometasone-formoterol (DULERA) 100-5 MCG/ACT AERO    Sig: Inhale 2 puffs into the lungs daily.  . methylPREDNISolone acetate (DEPO-MEDROL) injection 80 mg    Sig:    Continue meds Needs an appointment for follow up of chroinic medical problems STOP SMOKING!!!!  [name] Hassell Done, FNP  * Patient refused depomedrol shot

## 2013-06-14 NOTE — Patient Instructions (Signed)
Smoking Cessation Quitting smoking is important to your health and has many advantages. However, it is not always easy to quit since nicotine is a very addictive drug. Often times, people try 3 times or more before being able to quit. This document explains the best ways for you to prepare to quit smoking. Quitting takes hard work and a lot of effort, but you can do it. ADVANTAGES OF QUITTING SMOKING  You will live longer, feel better, and live better.  Your body will feel the impact of quitting smoking almost immediately.  Within 20 minutes, blood pressure decreases. Your pulse returns to its normal level.  After 8 hours, carbon monoxide levels in the blood return to normal. Your oxygen level increases.  After 24 hours, the chance of having a heart attack starts to decrease. Your breath, hair, and body stop smelling like smoke.  After 48 hours, damaged nerve endings begin to recover. Your sense of taste and smell improve.  After 72 hours, the body is virtually free of nicotine. Your bronchial tubes relax and breathing becomes easier.  After 2 to 12 weeks, lungs can hold more air. Exercise becomes easier and circulation improves.  The risk of having a heart attack, stroke, cancer, or lung disease is greatly reduced.  After 1 year, the risk of coronary heart disease is cut in half.  After 5 years, the risk of stroke falls to the same as a nonsmoker.  After 10 years, the risk of lung cancer is cut in half and the risk of other cancers decreases significantly.  After 15 years, the risk of coronary heart disease drops, usually to the level of a nonsmoker.  If you are pregnant, quitting smoking will improve your chances of having a healthy baby.  The people you live with, especially any children, will be healthier.  You will have extra money to spend on things other than cigarettes. QUESTIONS TO THINK ABOUT BEFORE ATTEMPTING TO QUIT You may want to talk about your answers with your  caregiver.  Why do you want to quit?  If you tried to quit in the past, what helped and what did not?  What will be the most difficult situations for you after you quit? How will you plan to handle them?  Who can help you through the tough times? Your family? Friends? A caregiver?  What pleasures do you get from smoking? What ways can you still get pleasure if you quit? Here are some questions to ask your caregiver:  How can you help me to be successful at quitting?  What medicine do you think would be best for me and how should I take it?  What should I do if I need more help?  What is smoking withdrawal like? How can I get information on withdrawal? GET READY  Set a quit date.  Change your environment by getting rid of all cigarettes, ashtrays, matches, and lighters in your home, car, or work. Do not let people smoke in your home.  Review your past attempts to quit. Think about what worked and what did not. GET SUPPORT AND ENCOURAGEMENT You have a better chance of being successful if you have help. You can get support in many ways.  Tell your family, friends, and co-workers that you are going to quit and need their support. Ask them not to smoke around you.  Get individual, group, or telephone counseling and support. Programs are available at local hospitals and health centers. Call your local health department for   information about programs in your area.  Spiritual beliefs and practices may help some smokers quit.  Download a "quit meter" on your computer to keep track of quit statistics, such as how long you have gone without smoking, cigarettes not smoked, and money saved.  Get a self-help book about quitting smoking and staying off of tobacco. LEARN NEW SKILLS AND BEHAVIORS  Distract yourself from urges to smoke. Talk to someone, go for a walk, or occupy your time with a task.  Change your normal routine. Take a different route to work. Drink tea instead of coffee.  Eat breakfast in a different place.  Reduce your stress. Take a hot bath, exercise, or read a book.  Plan something enjoyable to do every day. Reward yourself for not smoking.  Explore interactive web-based programs that specialize in helping you quit. GET MEDICINE AND USE IT CORRECTLY Medicines can help you stop smoking and decrease the urge to smoke. Combining medicine with the above behavioral methods and support can greatly increase your chances of successfully quitting smoking.  Nicotine replacement therapy helps deliver nicotine to your body without the negative effects and risks of smoking. Nicotine replacement therapy includes nicotine gum, lozenges, inhalers, nasal sprays, and skin patches. Some may be available over-the-counter and others require a prescription.  Antidepressant medicine helps people abstain from smoking, but how this works is unknown. This medicine is available by prescription.  Nicotinic receptor partial agonist medicine simulates the effect of nicotine in your brain. This medicine is available by prescription. Ask your caregiver for advice about which medicines to use and how to use them based on your health history. Your caregiver will tell you what side effects to look out for if you choose to be on a medicine or therapy. Carefully read the information on the package. Do not use any other product containing nicotine while using a nicotine replacement product.  RELAPSE OR DIFFICULT SITUATIONS Most relapses occur within the first 3 months after quitting. Do not be discouraged if you start smoking again. Remember, most people try several times before finally quitting. You may have symptoms of withdrawal because your body is used to nicotine. You may crave cigarettes, be irritable, feel very hungry, cough often, get headaches, or have difficulty concentrating. The withdrawal symptoms are only temporary. They are strongest when you first quit, but they will go away within  10 14 days. To reduce the chances of relapse, try to:  Avoid drinking alcohol. Drinking lowers your chances of successfully quitting.  Reduce the amount of caffeine you consume. Once you quit smoking, the amount of caffeine in your body increases and can give you symptoms, such as a rapid heartbeat, sweating, and anxiety.  Avoid smokers because they can make you want to smoke.  Do not let weight gain distract you. Many smokers will gain weight when they quit, usually less than 10 pounds. Eat a healthy diet and stay active. You can always lose the weight gained after you quit.  Find ways to improve your mood other than smoking. FOR MORE INFORMATION  www.smokefree.gov  Document Released: 02/17/2001 Document Revised: 08/25/2011 Document Reviewed: 06/04/2011 [employer] Patient Information 2014 [employer].  

## 2013-06-22 ENCOUNTER — Other Ambulatory Visit: Payer: Self-pay | Admitting: Urology

## 2013-07-19 ENCOUNTER — Other Ambulatory Visit: Payer: Self-pay | Admitting: *Deleted

## 2013-07-19 MED ORDER — FLUTICASONE-SALMETEROL 250-50 MCG/DOSE IN AEPB: 1.0000 | INHALATION_SPRAY | Freq: Every evening | RESPIRATORY_TRACT | Status: DC

## 2013-08-07 ENCOUNTER — Encounter (HOSPITAL_BASED_OUTPATIENT_CLINIC_OR_DEPARTMENT_OTHER): Payer: Self-pay | Admitting: *Deleted

## 2013-08-09 ENCOUNTER — Encounter (HOSPITAL_BASED_OUTPATIENT_CLINIC_OR_DEPARTMENT_OTHER): Payer: Self-pay | Admitting: *Deleted

## 2013-08-09 NOTE — Progress Notes (Signed)
NPO AFTER MN. ARRIVE AT 0715.  NEEDS ISTAT.  CURRENT EKG AND CXR IN CHART AND EPIC.  WILL TAKE PEPCID AND DO DULERA INHALER AM DOS W/ SIPS OF WATER. TO BE BRING RESCUE INHALER.

## 2013-08-14 ENCOUNTER — Encounter (HOSPITAL_BASED_OUTPATIENT_CLINIC_OR_DEPARTMENT_OTHER)
Admission: RE | Disposition: A | Discharge status: Home / Self Care | Payer: Self-pay | Source: Ambulatory Visit | Attending: Urology

## 2013-08-14 ENCOUNTER — Encounter (HOSPITAL_BASED_OUTPATIENT_CLINIC_OR_DEPARTMENT_OTHER): Payer: Self-pay | Admitting: Certified Registered"

## 2013-08-14 ENCOUNTER — Ambulatory Visit (HOSPITAL_BASED_OUTPATIENT_CLINIC_OR_DEPARTMENT_OTHER)
Admission: RE | Admit: 2013-08-14 | Discharge: 2013-08-14 | Disposition: A | Discharge status: Home / Self Care | Payer: BC Managed Care – PPO | Source: Ambulatory Visit | Attending: Urology | Admitting: Urology

## 2013-08-14 ENCOUNTER — Encounter (HOSPITAL_BASED_OUTPATIENT_CLINIC_OR_DEPARTMENT_OTHER): Payer: BC Managed Care – PPO | Admitting: Anesthesiology

## 2013-08-14 ENCOUNTER — Ambulatory Visit (HOSPITAL_BASED_OUTPATIENT_CLINIC_OR_DEPARTMENT_OTHER): Payer: BC Managed Care – PPO | Admitting: Anesthesiology

## 2013-08-14 DIAGNOSIS — E119 Type 2 diabetes mellitus without complications: Secondary | ICD-10-CM | POA: Insufficient documentation

## 2013-08-14 DIAGNOSIS — D494 Neoplasm of unspecified behavior of bladder: Secondary | ICD-10-CM

## 2013-08-14 DIAGNOSIS — J4489 Other specified chronic obstructive pulmonary disease: Secondary | ICD-10-CM | POA: Insufficient documentation

## 2013-08-14 DIAGNOSIS — Z794 Long term (current) use of insulin: Secondary | ICD-10-CM | POA: Insufficient documentation

## 2013-08-14 DIAGNOSIS — Z79899 Other long term (current) drug therapy: Secondary | ICD-10-CM | POA: Insufficient documentation

## 2013-08-14 DIAGNOSIS — J449 Chronic obstructive pulmonary disease, unspecified: Secondary | ICD-10-CM | POA: Insufficient documentation

## 2013-08-14 DIAGNOSIS — F17200 Nicotine dependence, unspecified, uncomplicated: Secondary | ICD-10-CM | POA: Insufficient documentation

## 2013-08-14 DIAGNOSIS — C679 Malignant neoplasm of bladder, unspecified: Secondary | ICD-10-CM | POA: Insufficient documentation

## 2013-08-14 HISTORY — DX: Malignant neoplasm of bladder, unspecified: C67.9

## 2013-08-14 HISTORY — PX: CYSTOSCOPY WITH URETEROSCOPY: SHX5123

## 2013-08-14 LAB — GLUCOSE, CAPILLARY: Glucose-Capillary: 91 mg/dL (ref 70–99)

## 2013-08-14 LAB — POCT I-STAT 4, (NA,K, GLUC, HGB,HCT)
Glucose, Bld: 126 mg/dL — ABNORMAL HIGH (ref 70–99)
HCT: 43 % (ref 39.0–52.0)
Hemoglobin: 14.6 g/dL (ref 13.0–17.0)
Potassium: 3.8 mEq/L (ref 3.7–5.3)
Sodium: 141 mEq/L (ref 137–147)

## 2013-08-14 SURGERY — CYSTOSCOPY WITH URETEROSCOPY
Anesthesia: General | Site: Bladder | Laterality: Right

## 2013-08-14 MED ORDER — TAMSULOSIN HCL 0.4 MG PO CAPS
ORAL_CAPSULE | ORAL | Status: AC
  Filled 2013-08-14: qty 1

## 2013-08-14 MED ORDER — LACTATED RINGERS IV SOLN
INTRAVENOUS | Status: DC
  Administered 2013-08-14 (×2): via INTRAVENOUS
  Filled 2013-08-14: qty 1000

## 2013-08-14 MED ORDER — MIDAZOLAM HCL 5 MG/5ML IJ SOLN
INTRAMUSCULAR | Status: DC | PRN
  Administered 2013-08-14: 2 mg via INTRAVENOUS

## 2013-08-14 MED ORDER — CEFAZOLIN SODIUM-DEXTROSE 2-3 GM-% IV SOLR
2.0000 g | INTRAVENOUS | Status: AC
  Administered 2013-08-14: 2 g via INTRAVENOUS
  Filled 2013-08-14: qty 50

## 2013-08-14 MED ORDER — ONDANSETRON HCL 4 MG/2ML IJ SOLN
INTRAMUSCULAR | Status: DC | PRN
  Administered 2013-08-14: 4 mg via INTRAVENOUS

## 2013-08-14 MED ORDER — FENTANYL CITRATE 0.05 MG/ML IJ SOLN
INTRAMUSCULAR | Status: AC
  Filled 2013-08-14: qty 6

## 2013-08-14 MED ORDER — MIDAZOLAM HCL 2 MG/2ML IJ SOLN
INTRAMUSCULAR | Status: AC
  Filled 2013-08-14: qty 2

## 2013-08-14 MED ORDER — DEXAMETHASONE SODIUM PHOSPHATE 4 MG/ML IJ SOLN
INTRAMUSCULAR | Status: DC | PRN
  Administered 2013-08-14: 10 mg via INTRAVENOUS

## 2013-08-14 MED ORDER — PROPOFOL 10 MG/ML IV BOLUS
INTRAVENOUS | Status: DC | PRN
  Administered 2013-08-14: 160 mg via INTRAVENOUS

## 2013-08-14 MED ORDER — STERILE WATER FOR IRRIGATION IR SOLN
Status: DC | PRN
Start: 2013-08-14 — End: 2013-08-14
  Administered 2013-08-14: 500 mL

## 2013-08-14 MED ORDER — FENTANYL CITRATE 0.05 MG/ML IJ SOLN
25.0000 ug | INTRAMUSCULAR | Status: DC | PRN
  Filled 2013-08-14: qty 1

## 2013-08-14 MED ORDER — PHENAZOPYRIDINE HCL 200 MG PO TABS
200.0000 mg | ORAL_TABLET | Freq: Once | ORAL | Status: AC
  Administered 2013-08-14: 200 mg via ORAL
  Filled 2013-08-14: qty 1

## 2013-08-14 MED ORDER — TAMSULOSIN HCL 0.4 MG PO CAPS
0.4000 mg | ORAL_CAPSULE | Freq: Once | ORAL | Status: AC
  Administered 2013-08-14: 0.4 mg via ORAL
  Filled 2013-08-14: qty 1

## 2013-08-14 MED ORDER — PHENAZOPYRIDINE HCL 100 MG PO TABS
ORAL_TABLET | ORAL | Status: AC
  Filled 2013-08-14: qty 1

## 2013-08-14 MED ORDER — PROMETHAZINE HCL 25 MG/ML IJ SOLN
6.2500 mg | INTRAMUSCULAR | Status: DC | PRN
  Filled 2013-08-14: qty 1

## 2013-08-14 MED ORDER — FENTANYL CITRATE 0.05 MG/ML IJ SOLN
INTRAMUSCULAR | Status: DC | PRN
  Administered 2013-08-14: 50 ug via INTRAVENOUS
  Administered 2013-08-14 (×2): 25 ug via INTRAVENOUS

## 2013-08-14 MED ORDER — KETOROLAC TROMETHAMINE 30 MG/ML IJ SOLN
15.0000 mg | Freq: Once | INTRAMUSCULAR | Status: DC | PRN
  Filled 2013-08-14: qty 1

## 2013-08-14 MED ORDER — SODIUM CHLORIDE 0.9 % IR SOLN
Status: DC | PRN
  Administered 2013-08-14: 3000 mL via INTRAVESICAL

## 2013-08-14 MED ORDER — LIDOCAINE HCL (CARDIAC) 20 MG/ML IV SOLN
INTRAVENOUS | Status: DC | PRN
  Administered 2013-08-14: 60 mg via INTRAVENOUS

## 2013-08-14 MED ORDER — IOHEXOL 350 MG/ML SOLN
INTRAVENOUS | Status: DC | PRN
  Administered 2013-08-14: 4 mL

## 2013-08-14 MED ORDER — PHENAZOPYRIDINE HCL 200 MG PO TABS: 200.0000 mg | ORAL_TABLET | Freq: Three times a day (TID) | ORAL | Status: DC | PRN

## 2013-08-14 MED ORDER — HYDROCODONE-ACETAMINOPHEN 7.5-325 MG PO TABS: 1.0000 | ORAL_TABLET | ORAL | Status: DC | PRN

## 2013-08-14 SURGICAL SUPPLY — 35 items
ADAPTER CATH URET PLST 4-6FR (CATHETERS) IMPLANT
ADPR CATH URET STRL DISP 4-6FR (CATHETERS)
BAG DRAIN URO-CYSTO SKYTR STRL (DRAIN) ×2 IMPLANT
BAG DRN UROCATH (DRAIN) ×1
BASKET LASER NITINOL 1.9FR (BASKET) IMPLANT
BASKET STNLS GEMINI 4WIRE 3FR (BASKET) IMPLANT
BASKET ZERO TIP NITINOL 2.4FR (BASKET) IMPLANT
BSKT STON RTRVL 120 1.9FR (BASKET)
BSKT STON RTRVL GEM 120X11 3FR (BASKET)
BSKT STON RTRVL ZERO TP 2.4FR (BASKET)
CANISTER SUCT LVC 12 LTR MEDI- (MISCELLANEOUS) IMPLANT
CATH INTERMIT  6FR 70CM (CATHETERS) IMPLANT
CATH URET 5FR 28IN CONE TIP (BALLOONS)
CATH URET 5FR 70CM CONE TIP (BALLOONS) IMPLANT
CLOTH BEACON ORANGE TIMEOUT ST (SAFETY) ×2 IMPLANT
DRAPE CAMERA CLOSED 9X96 (DRAPES) ×2 IMPLANT
ELECT REM PT RETURN 9FT ADLT (ELECTROSURGICAL)
ELECTRODE REM PT RTRN 9FT ADLT (ELECTROSURGICAL) IMPLANT
FIBER LASER FLEXIVA 200 (UROLOGICAL SUPPLIES) IMPLANT
FIBER LASER FLEXIVA 365 (UROLOGICAL SUPPLIES) IMPLANT
GLOVE BIO SURGEON STRL SZ8 (GLOVE) ×2 IMPLANT
GOWN PREVENTION PLUS LG XLONG (DISPOSABLE) ×2 IMPLANT
GOWN STRL REIN XL XLG (GOWN DISPOSABLE) ×2 IMPLANT
GUIDEWIRE 0.038 PTFE COATED (WIRE) ×2 IMPLANT
GUIDEWIRE ANG ZIPWIRE 038X150 (WIRE) IMPLANT
GUIDEWIRE STR DUAL SENSOR (WIRE) IMPLANT
IV NS IRRIG 3000ML ARTHROMATIC (IV SOLUTION) ×2 IMPLANT
KIT BALLIN UROMAX 15FX10 (LABEL) IMPLANT
KIT BALLN UROMAX 15FX4 (MISCELLANEOUS) IMPLANT
KIT BALLN UROMAX 26 75X4 (MISCELLANEOUS)
PACK CYSTOSCOPY (CUSTOM PROCEDURE TRAY) ×2 IMPLANT
SET HIGH PRES BAL DIL (LABEL)
SHEATH ACCESS URETERAL 38CM (SHEATH) IMPLANT
SHEATH ACCESS URETERAL 54CM (SHEATH) IMPLANT
SYRINGE IRR TOOMEY STRL 70CC (SYRINGE) IMPLANT

## 2013-08-14 NOTE — Anesthesia Postprocedure Evaluation (Signed)
  Anesthesia Post-op Note  Patient: Thomas Sandoval  Procedure(s) Performed: Procedure(s) (LRB): CYSTOSCOPY WITH URETEROSCOPY BLADDER BIOPSY  (Right)  Patient Location: PACU  Anesthesia Type: General  Level of Consciousness: awake and alert   Airway and Oxygen Therapy: Patient Spontanous Breathing  Post-op Pain: mild  Post-op Assessment: Post-op Vital signs reviewed, Patient's Cardiovascular Status Stable, Respiratory Function Stable, Patent Airway and No signs of Nausea or vomiting  Last Vitals:  Filed Vitals:   08/14/13 0945  BP: 144/90  Pulse: 66  Temp:   Resp: 16    Post-op Vital Signs: stable   Complications: No apparent anesthesia complications

## 2013-08-14 NOTE — Anesthesia Procedure Notes (Signed)
Procedure Name: LMA Insertion Date/Time: 08/14/2013 8:37 AM Performed by: Denna Haggard D Pre-anesthesia Checklist: Patient identified, Emergency Drugs available, Suction available and Patient being monitored Patient Re-evaluated:Patient Re-evaluated prior to inductionOxygen Delivery Method: Circle System Utilized Preoxygenation: Pre-oxygenation with 100% oxygen Intubation Type: IV induction Ventilation: Mask ventilation without difficulty LMA: LMA inserted LMA Size: 4.0 Number of attempts: 1 Airway Equipment and Method: bite block Placement Confirmation: positive ETCO2 Tube secured with: Tape Dental Injury: Teeth and Oropharynx as per pre-operative assessment

## 2013-08-14 NOTE — Transfer of Care (Signed)
Immediate Anesthesia Transfer of Care Note  Patient: Thomas Sandoval  Procedure(s) Performed: Procedure(s) (LRB): CYSTOSCOPY WITH URETEROSCOPY BLADDER BIOPSY  (Right)  Patient Location: PACU  Anesthesia Type: General  Level of Consciousness: awake, oriented, sedated and patient cooperative  Airway & Oxygen Therapy: Patient Spontanous Breathing and Patient connected to face mask oxygen  Post-op Assessment: Report given to PACU RN and Post -op Vital signs reviewed and stable  Post vital signs: Reviewed and stable  Complications: No apparent anesthesia complications

## 2013-08-14 NOTE — Op Note (Signed)
PATIENT:  Thomas Sandoval  PRE-OPERATIVE DIAGNOSIS: 1. History of transitional cell carcinoma stage T1, grade G3  2. Possible involvement of distal right ureter with transitional cell carcinoma.  POST-OPERATIVE DIAGNOSIS: 1. History of transitional cell carcinoma of the bladder. 2. No evidence of involvement of distal right ureter.  PROCEDURE: 1. Cystoscopy with right retrograde pyelogram including interpretation. 2. Right ureteroscopy. 3. Transurethral resectional biopsy of the bladder.  SURGEON:  Claybon Jabs  INDICATION: Thomas Sandoval is a 58 year old male who underwent TURBT on 06/12/13. It was noted to be papillary and nodular in configuration and was found to be high-grade and involved the lamina propria with no evidence of invasion of the muscularis. In addition to the tumor appeared to abut the right ureteral orifice and when it was resected and there appeared to possibly be involvement of the distal right ureter. Is brought back to the operating room today for repeat biopsy and evaluation of his distal right ureter.  ANESTHESIA:  General  EBL:  Minimal  DRAINS: None  LOCAL MEDICATIONS USED:  2% lidocaine jelly in the urethra.  SPECIMEN: Resectional biopsy of previous tumor site to pathology.  Description of procedure: After informed consent the patient was taken to the operating room and placed on the table in a supine position. General anesthesia was then administered. Once fully anesthetized the patient was moved to the dorsal lithotomy position and the genitalia were sterilely prepped and draped in standard fashion. An official timeout was then performed.  I initially passed the 66 French cystoscope under direct vision down the urethra which was noted be normal. The prostatic urethra was noted be short and nonobstructed. There were no lesions within the prostatic urethra. Upon entering the bladder I noted in the bladder had an area where the previous tumor was resected.  This was photographed. I visually inspected the entire inside surface of the bladder and noted no worrisome lesions, tumors or other abnormal findings. The left ureteral orifice appeared normal. I inspected the right ureteral orifice quite closely and could not identify any abnormality of the visible mucosa lining the orifice.  A right retrograde pyelogram was then performed using full-strength iodinated contrast. I passed a 6 [place] open-ended ureteral catheter through the cystoscope and into the right ureteral orifice. I then injected full-strength contrast under direct fluoroscopy and noted no abnormality of the ureter. Specifically there were no filling defects. I then passed a 0.038 inch sensor-tip guidewire through the open ended catheter and up the right ureter under fluoroscopy and left this in place and removed the cystoscope and open-ended catheter. I then proceeded with right ureteroscopy.  A 6 French rigid ureteroscope was then passed under direct vision into the bladder and I was able to negotiated into the right ureteral orifice without difficulty. The distal ureter was fully inspected and noted be entirely normal without evidence of any papillary lesions or areas of erythema. I was able to visualize the distal ureter and then backed the scope on the out of ureteral orifice and noted absolutely no abnormality of the ureter whatsoever. I therefore removed the guidewire and ureteroscope was removed as well.  I then dilated his urethral meatus with Leander Rams sounds up to 90 [place] and passed the 26 [place] resectoscope sheath with Timberlake obturator into the bladder. The obturator was then removed and replaced with the resectoscope element and 12 lens. I resected the base of the previous bladder tumor location deep into the detrusor muscle. I obtained some of the  mucosa lateral and medial to the previous resection site as well. Bleeding points were cauterized with electrocautery and there was no  bleeding noted at the end of the procedure. I used the Microvasive evacuator to remove the biopsy specimens and these were sent to pathology. The bladder was then drained and the resectoscope removed. I instilled 2% lidocaine jelly in the urethra and applied a penile clamp and the patient was awakened and taken to the recovery room in stable and satisfactory condition. He tolerated the procedure well no intraoperative complications.  PLAN OF CARE: Discharge to home after PACU  PATIENT DISPOSITION:  PACU - hemodynamically stable.

## 2013-08-14 NOTE — H&P (Signed)
History of Present Illness Transitional cell carcinoma of the bladder: He reported having seen blood in his urine on and off for 2 years. He smokes (2-3 packs per day for >30 years). CT scan 02/21/11 revealed a 2.4 centimeter mass in the bladder. Follow-up CT scan revealed a mass at increased to over 3 cm. There was no evidence of adenopathy, hydronephrosis or extravesical extension.  TURBT 06/12/13: Papillary and nodular high-grade transitional cell carcinoma with lamina propria invasion but no evidence of muscularis involvement. (T1,G3)    Incomplete bladder emptying: He gets up once at night and reports a good flow.  Uroflowmetry 04/05/13: He voided 181 mL with an interrupted flow pattern that was prolonged. Maximum flow rate was 6 mL/sec.   PVR: 409 cc.    Interval history: He did remarkably well after his surgery. He really hasn't had any significant complaints since surgery. He did develop a small area of superficial phlebitis where his IV was in his right arm.    Past Medical History Problems  1. History of diabetes mellitus (V12.29) 2. History of hematuria (V13.09)  Surgical History Problems  1. History of No Surgical Problems  Current Meds 1. Advair HFA AERO;  Therapy: (Recorded:16Feb2015) to Recorded 2. Lantus SOLN;  Therapy: (Recorded:28Jan2015) to Recorded 3. Motrin TABS;  Therapy: (Recorded:16Feb2015) to Recorded  Allergies Medication  1. Avelox TABS 2. Zithromax Z-Pak TABS 3. Bactrim TABS  Family History Problems  1. Family history of No acute medical problems   no family history provided on form. 2. No pertinent family history : Mother  Social History Problems  1. Denied: History of Alcohol use 2. Caffeine use (V49.89) 3. Current smoker (305.1) 4. Employed as farmer 5. Married 6. Three children   2 son and 1 daughter  Review of Systems Constitutional, skin, eye, otolaryngeal, hematologic/lymphatic, cardiovascular, pulmonary, endocrine,  gastrointestinal, neurological and psychiatric system(s) were reviewed and pertinent findings if present are noted.  Genitourinary: hematuria.  Musculoskeletal: back pain.    Vitals Vital Signs  Height: 5 ft 9 in Weight: 168 lb  BMI Calculated: 24.81 BSA Calculated: 1.92 Blood Pressure: 144 / 64 Heart Rate: 74  Physical Exam Constitutional: Well nourished and well developed . No acute distress.   ENT:. The ears and nose are normal in appearance.   Neck: The appearance of the neck is normal and no neck mass is present.   Pulmonary: No respiratory distress and normal respiratory rhythm and effort.   Cardiovascular: Heart rate and rhythm are normal . No peripheral edema.   Lymphatics: The femoral and inguinal nodes are not enlarged or tender.   Skin: Normal skin turgor, no visible rash and no visible skin lesions.   Neuro/Psych:. Mood and affect are appropriate.     Assessment   I went over his pathology with him. We discussed the fact that he has high-grade transitional cell carcinoma of the bladder that appeared to be isolated to the lamina propria and not into the muscularis. It involved the right ureteral orifice and there was some question of papillary changes of the ureteral mucosa noted at the time of his tumor resection. Because of the grade and stage we have discussed the need for repeat cystoscopic evaluation with further biopsies and resection of any further suspicious lesions. In addition I will evaluate his distal ureter ureteroscopically and very likely will obtain a biopsy of the ureter if it appears suspicious in any way. I would like to give him time for the bladder to heal before his  next procedure.  We then discussed the possible treatment options and if it appears that the tumor is in fact isolated to the lamina propria the options include radical cystectomy versus an induction course of BCG and surveillance cystoscopies.   Plan   He will be scheduled for  repeat cystoscopy with bladder biopsy/resection and right ureteroscopy.

## 2013-08-14 NOTE — Anesthesia Preprocedure Evaluation (Addendum)
Anesthesia Evaluation  Patient identified by MRN, date of birth, ID band Patient awake    Reviewed: Allergy & Precautions, H&P , NPO status , Patient's Chart, lab work & pertinent test results  Airway Mallampati: II TM Distance: >3 FB Neck ROM: Full    Dental no notable dental hx.    Pulmonary COPDCurrent Smoker,  breath sounds clear to auscultation  Pulmonary exam normal       Cardiovascular Rhythm:Regular Rate:Normal  History of atrial flutter   EPISODE 02/2011  CONVERTED TO NSR WITH CARDIZEM    Neuro/Psych negative neurological ROS  negative psych ROS   GI/Hepatic negative GI ROS, Neg liver ROS,   Endo/Other  diabetes, Insulin Dependent  Renal/GU negative Renal ROS  negative genitourinary   Musculoskeletal negative musculoskeletal ROS (+)   Abdominal   Peds negative pediatric ROS (+)  Hematology negative hematology ROS (+)   Anesthesia Other Findings   Reproductive/Obstetrics negative OB ROS                          Anesthesia Physical Anesthesia Plan  ASA: III  Anesthesia Plan: General   Post-op Pain Management:    Induction: Intravenous  Airway Management Planned: LMA  Additional Equipment:   Intra-op Plan:   Post-operative Plan: Extubation in OR  Informed Consent: I have reviewed the patients History and Physical, chart, labs and discussed the procedure including the risks, benefits and alternatives for the proposed anesthesia with the patient or authorized representative who has indicated his/her understanding and acceptance.   Dental advisory given  Plan Discussed with: CRNA and Surgeon  Anesthesia Plan Comments:         Anesthesia Quick Evaluation

## 2013-08-14 NOTE — Discharge Instructions (Addendum)
Post Bladder Surgery Instructions ° ° °General instructions: °   ° Your recent bladder surgery requires very little post hospital care but some definite precautions. ° °Despite the fact that no skin incisions were used, the area around the bladder incisions are raw and covered with scabs to promote healing and prevent bleeding. Certain precautions are needed to insure that the scabs are not disturbed over the next 2-4 weeks while the healing proceeds. ° °Because the raw surface inside your bladder and the irritating effects of urine you may expect frequency of urination and/or urgency (a stronger desire to urinate) and perhaps even getting up at night more often. This will usually resolve or improve slowly over the healing period. You may see some blood in your urine over the first 6 weeks. Do not be alarmed, even if the urine was clear for a while. Get off your feet and drink lots of fluids until clearing occurs. If you start to pass clots or don't improve call us. ° °Catheter: (If you are discharged with a catheter.) ° °1. Keep your catheter secured to your leg at all times with tape or the supplied strap. °2. You may experience leakage of urine around your catheter- as long as the  °catheter continues to drain, this is normal.  If your catheter stops draining  °go to the ER. °3. You may also have blood in your urine, even after it has been clear for  °several days; you may even pass some small blood clots or other material.  This  °is normal as well.  If this happens, sit down and drink plenty of water to help  °make urine to flush out your bladder.  If the blood in your urine becomes worse  °after doing this, contact our office or return to the ER. °4. You may use the leg bag (small bag) during the day, but use the large bag at  °night. ° °Diet: ° °You may return to your normal diet immediately. Because of the raw surface of your bladder, alcohol, spicy foods, foods high in acid and drinks with caffeine may  cause irritation or frequency and should be used in moderation. To keep your urine flowing freely and avoid constipation, drink plenty of fluids during the day (8-10 glasses). Tip: Avoid cranberry juice because it is very acidic. ° °Activity: ° °Your physical activity doesn't need to be restricted. However, if you are very active, you may see some blood in the urine. We suggest that you reduce your activity under the circumstances until the bleeding has stopped. ° °Bowels: ° °It is important to keep your bowels regular during the postoperative period. Straining with bowel movements can cause bleeding. A bowel movement every other day is reasonable. Use a mild laxative if needed, such as milk of magnesia 2-3 tablespoons, or 2 Dulcolax tablets. Call if you continue to have problems. If you had been taking narcotics for pain, before, during or after your surgery, you may be constipated. Take a laxative if necessary. ° ° ° °Medication: ° °You should resume your pre-surgery medications unless told not to. In addition you may be given an antibiotic to prevent or treat infection. Antibiotics are not always necessary. All medication should be taken as prescribed until the bottles are finished unless you are having an unusual reaction to one of the drugs. ° ° °Post Anesthesia Home Care Instructions ° °Activity: °Get plenty of rest for the remainder of the day. A responsible adult should stay with you for   24 hours following the procedure.  °For the next 24 hours, DO NOT: °-Drive a car °-Operate machinery °-Drink alcoholic beverages °-Take any medication unless instructed by your physician °-Make any legal decisions or sign important papers. ° °Meals: °Start with liquid foods such as gelatin or soup. Progress to regular foods as tolerated. Avoid greasy, spicy, heavy foods. If nausea and/or vomiting occur, drink only clear liquids until the nausea and/or vomiting subsides. Call your physician if vomiting continues. ° °Special  Instructions/Symptoms: °Your throat may feel dry or sore from the anesthesia or the breathing tube placed in your throat during surgery. If this causes discomfort, gargle with warm salt water. The discomfort should disappear within 24 hours. ° ° °

## 2013-08-16 ENCOUNTER — Encounter (HOSPITAL_BASED_OUTPATIENT_CLINIC_OR_DEPARTMENT_OTHER): Payer: Self-pay | Admitting: Urology

## 2013-08-31 ENCOUNTER — Other Ambulatory Visit: Payer: Self-pay | Admitting: Family Medicine

## 2013-09-14 ENCOUNTER — Telehealth: Payer: Self-pay | Admitting: Family Medicine

## 2013-09-14 NOTE — Telephone Encounter (Signed)
Called to make an appointment for diabetes bundle

## 2013-09-14 NOTE — Telephone Encounter (Signed)
  Patient declines at the time

## 2013-09-15 ENCOUNTER — Other Ambulatory Visit: Payer: Self-pay | Admitting: Family Medicine

## 2013-10-04 LAB — HM DIABETES EYE EXAM

## 2013-11-15 ENCOUNTER — Other Ambulatory Visit: Payer: Self-pay | Admitting: Nurse Practitioner

## 2013-12-26 ENCOUNTER — Other Ambulatory Visit: Payer: Self-pay | Admitting: Nurse Practitioner

## 2013-12-27 NOTE — Telephone Encounter (Signed)
Last seen 06/14/13  MMM  Last glucose 08/14/13

## 2013-12-27 NOTE — Telephone Encounter (Signed)
no more refills without being seen  

## 2013-12-28 ENCOUNTER — Telehealth: Payer: Self-pay | Admitting: *Deleted

## 2013-12-28 NOTE — Telephone Encounter (Signed)
Aware. 

## 2014-01-10 ENCOUNTER — Other Ambulatory Visit: Payer: Self-pay | Admitting: Nurse Practitioner

## 2014-01-21 ENCOUNTER — Other Ambulatory Visit: Payer: Self-pay | Admitting: Nurse Practitioner

## 2014-02-18 ENCOUNTER — Other Ambulatory Visit: Payer: Self-pay | Admitting: Family Medicine

## 2014-02-19 ENCOUNTER — Telehealth: Payer: Self-pay | Admitting: *Deleted

## 2014-02-19 MED ORDER — INSULIN GLARGINE 100 UNIT/ML ~~LOC~~ SOLN: SUBCUTANEOUS | Status: DC

## 2014-02-19 NOTE — Telephone Encounter (Signed)
Pt not had labs since we been on Epic. Has appt in January for diabetic checkup. Pt said he takes Lantus 32 units in the am. Epic has 40units. Pt said he adjust his insulin on his own as needed. Please review. Needs a refill until appt only.

## 2014-02-19 NOTE — Telephone Encounter (Signed)
Detailed message left that rx was sent pharmacy

## 2014-02-19 NOTE — Telephone Encounter (Signed)
lantus rx sent to pharamcy- let patient know that he should not adjust his own ionsulin dose.

## 2014-03-14 ENCOUNTER — Ambulatory Visit: Payer: BC Managed Care – PPO | Admitting: Nurse Practitioner

## 2014-03-18 ENCOUNTER — Other Ambulatory Visit: Payer: Self-pay | Admitting: Nurse Practitioner

## 2014-03-26 ENCOUNTER — Telehealth: Payer: Self-pay | Admitting: Nurse Practitioner

## 2014-03-26 NOTE — Telephone Encounter (Signed)
Pt given appt tomorrow with Dietrich Pates at 9:45 to evaluate leg cramps. Pt aware to keep follow up appt with MMM 04/27/14.

## 2014-03-27 ENCOUNTER — Ambulatory Visit (INDEPENDENT_AMBULATORY_CARE_PROVIDER_SITE_OTHER): Payer: BLUE CROSS/BLUE SHIELD | Admitting: Family Medicine

## 2014-03-27 ENCOUNTER — Ambulatory Visit (INDEPENDENT_AMBULATORY_CARE_PROVIDER_SITE_OTHER): Payer: BLUE CROSS/BLUE SHIELD

## 2014-03-27 ENCOUNTER — Encounter: Payer: Self-pay | Admitting: Family Medicine

## 2014-03-27 ENCOUNTER — Other Ambulatory Visit: Payer: Self-pay | Admitting: Family Medicine

## 2014-03-27 ENCOUNTER — Other Ambulatory Visit: Payer: Self-pay

## 2014-03-27 VITALS — BP 157/73 | HR 60 | Temp 97.3°F | Ht 70.0 in | Wt 162.0 lb

## 2014-03-27 DIAGNOSIS — M25569 Pain in unspecified knee: Secondary | ICD-10-CM

## 2014-03-27 DIAGNOSIS — M79604 Pain in right leg: Secondary | ICD-10-CM

## 2014-03-27 DIAGNOSIS — G2581 Restless legs syndrome: Secondary | ICD-10-CM

## 2014-03-27 DIAGNOSIS — Z720 Tobacco use: Secondary | ICD-10-CM

## 2014-03-27 DIAGNOSIS — N189 Chronic kidney disease, unspecified: Secondary | ICD-10-CM

## 2014-03-27 DIAGNOSIS — M79605 Pain in left leg: Principal | ICD-10-CM

## 2014-03-27 DIAGNOSIS — E1122 Type 2 diabetes mellitus with diabetic chronic kidney disease: Secondary | ICD-10-CM

## 2014-03-27 LAB — POCT CBC
Granulocyte percent: 72.2 %G (ref 37–80)
HCT, POC: 44.5 % (ref 43.5–53.7)
Hemoglobin: 14.4 g/dL (ref 14.1–18.1)
Lymph, poc: 1.6 (ref 0.6–3.4)
MCH, POC: 30.5 pg (ref 27–31.2)
MCHC: 32.5 g/dL (ref 31.8–35.4)
MCV: 93.8 fL (ref 80–97)
MPV: 7.9 fL (ref 0–99.8)
POC Granulocyte: 5 (ref 2–6.9)
POC LYMPH PERCENT: 23.4 %L (ref 10–50)
Platelet Count, POC: 207 10*3/uL (ref 142–424)
RBC: 4.7 M/uL (ref 4.69–6.13)
RDW, POC: 12.9 %
WBC: 6.9 10*3/uL (ref 4.6–10.2)

## 2014-03-27 LAB — POCT GLYCOSYLATED HEMOGLOBIN (HGB A1C): Hemoglobin A1C: 6.9

## 2014-03-27 MED ORDER — CARBIDOPA-LEVODOPA ER 50-200 MG PO TBCR: 1.0000 | EXTENDED_RELEASE_TABLET | Freq: Every day | ORAL | Status: DC

## 2014-03-27 NOTE — Progress Notes (Signed)
Subjective:    Patient ID: Thomas Sandoval, male    DOB: [birth year], 57 y.o.   MRN: [id number]  HPI Patient is here for c/o lower extremity pain that occurs when he walks distances and then he has cramping and discomfort in his lower extremities during night time.  He has hx of cigarette smoking and he has hx of diabetes.  He is due for diabetes appointment and needs labs.  He has been smoking for years.    Review of Systems  Constitutional: Negative for fever.  HENT: Negative for ear pain.   Eyes: Negative for discharge.  Respiratory: Negative for cough.   Cardiovascular: Negative for chest pain.  Gastrointestinal: Negative for abdominal distention.  Endocrine: Negative for polyuria.  Genitourinary: Negative for difficulty urinating.  Musculoskeletal: Negative for gait problem and neck pain.  Skin: Negative for color change and rash.  Neurological: Negative for speech difficulty and headaches.  Psychiatric/Behavioral: Negative for agitation.       Objective:    BP 157/73 mmHg  Pulse 60  Temp(Src) 97.3 F (36.3 C) (Oral)  Ht 5' 10"  (1.778 m)  Wt 162 lb (73.483 kg)  BMI 23.24 kg/m2 Physical Exam  Constitutional: He is oriented to person, place, and time. He appears well-developed and well-nourished.  HENT:  Head: Normocephalic and atraumatic.  Mouth/Throat: Oropharynx is clear and moist.  Eyes: Pupils are equal, round, and reactive to light.  Neck: Normal range of motion. Neck supple.  Cardiovascular: Normal rate and regular rhythm.   No murmur heard. Pulmonary/Chest: Effort normal and breath sounds normal.  Abdominal: Soft. Bowel sounds are normal. There is no tenderness.  Neurological: He is alert and oriented to person, place, and time.  Skin: Skin is warm and dry.  Psychiatric: He has a normal mood and affect.   Results for orders placed or performed in visit on 03/27/14  POCT CBC  Result Value Ref Range   WBC 6.9 4.6 - 10.2 K/uL   Lymph, poc 1.6 0.6 - 3.4   POC LYMPH PERCENT 23.4 10 - 50 %L   POC Granulocyte 5.0 2 - 6.9   Granulocyte percent 72.2 37 - 80 %G   RBC 4.7 4.69 - 6.13 M/uL   Hemoglobin 14.4 14.1 - 18.1 g/dL   HCT, POC 44.5 43.5 - 53.7 %   MCV 93.8 80 - 97 fL   MCH, POC 30.5 27 - 31.2 pg   MCHC 32.5 31.8 - 35.4 g/dL   RDW, POC 12.9 %   Platelet Count, POC 207.0 142 - 424 K/uL   MPV 7.9 0 - 99.8 fL  POCT glycosylated hemoglobin (Hb A1C)  Result Value Ref Range   Hemoglobin A1C 6.9%    CXR - No infiltrates Xray LS spine - DDD and scoliosis  Prelimnary reading by Gwyndolyn Saxon [employer],FNP      Assessment & Plan:     ICD-9-CM ICD-10-CM   1. Type 2 diabetes mellitus with diabetic chronic kidney disease 250.40 E11.22 POCT CBC   585.9 N18.9 POCT glycosylated hemoglobin (Hb A1C)     CMP14+EGFR     Lipid panel     PSA, total and free  2. Tobacco abuse 305.1 Z72.0 DG Chest 2 View  3. Pain in joint, lower leg, unspecified laterality 719.46 M25.569 Lower Extremity Arterial Doppler     DG Lumbar Spine 2-3 Views     DG Chest 2 View  4. RLS (restless legs syndrome) 333.94 G25.81 carbidopa-levodopa (SINEMET CR) 50-200 MG per tablet  Return in about 6 months (around 09/25/2014).  Lysbeth Penner FNP

## 2014-03-28 ENCOUNTER — Telehealth: Payer: Self-pay | Admitting: Family Medicine

## 2014-03-28 LAB — CMP14+EGFR
ALT: 9 IU/L (ref 0–44)
AST: 15 IU/L (ref 0–40)
Albumin/Globulin Ratio: 2 (ref 1.1–2.5)
Albumin: 4.3 g/dL (ref 3.5–5.5)
Alkaline Phosphatase: 68 IU/L (ref 39–117)
BUN/Creatinine Ratio: 10 (ref 9–20)
BUN: 9 mg/dL (ref 6–24)
CO2: 24 mmol/L (ref 18–29)
Calcium: 9.6 mg/dL (ref 8.7–10.2)
Chloride: 96 mmol/L — ABNORMAL LOW (ref 97–108)
Creatinine, Ser: 0.88 mg/dL (ref 0.76–1.27)
GFR calc Af Amer: 111 mL/min/{1.73_m2} (ref >59)
GFR calc non Af Amer: 96 mL/min/{1.73_m2} (ref >59)
Globulin, Total: 2.1 g/dL (ref 1.5–4.5)
Glucose: 180 mg/dL — ABNORMAL HIGH (ref 65–99)
Potassium: 5 mmol/L (ref 3.5–5.2)
Sodium: 131 mmol/L — ABNORMAL LOW (ref 134–144)
Total Bilirubin: 0.7 mg/dL (ref 0.0–1.2)
Total Protein: 6.4 g/dL (ref 6.0–8.5)

## 2014-03-28 LAB — LIPID PANEL
Chol/HDL Ratio: 3.2 ratio units (ref 0.0–5.0)
Cholesterol, Total: 110 mg/dL (ref 100–199)
HDL: 34 mg/dL — ABNORMAL LOW (ref >39)
LDL Calculated: 60 mg/dL (ref 0–99)
Triglycerides: 80 mg/dL (ref 0–149)
VLDL Cholesterol Cal: 16 mg/dL (ref 5–40)

## 2014-03-28 LAB — PSA, TOTAL AND FREE
PSA, Free Pct: 14.3 %
PSA, Free: 0.3 ng/mL
PSA: 2.1 ng/mL (ref 0.0–4.0)

## 2014-03-28 NOTE — Telephone Encounter (Signed)
Patient states that leg pain is worse today than yesterday.  He woke up vomiting at 5:30 this morning and attributes it to the Sinemet that he took at 12:00 am last night. He doesn't think he'll be able to tolerate it.

## 2014-04-02 ENCOUNTER — Ambulatory Visit (HOSPITAL_COMMUNITY)
Admission: RE | Admit: 2014-04-02 | Discharge: 2014-04-02 | Disposition: A | Discharge status: Home / Self Care | Payer: BLUE CROSS/BLUE SHIELD | Source: Ambulatory Visit | Attending: Family Medicine | Admitting: Family Medicine

## 2014-04-02 DIAGNOSIS — M79604 Pain in right leg: Secondary | ICD-10-CM

## 2014-04-02 DIAGNOSIS — M79605 Pain in left leg: Secondary | ICD-10-CM | POA: Insufficient documentation

## 2014-04-02 DIAGNOSIS — I739 Peripheral vascular disease, unspecified: Secondary | ICD-10-CM | POA: Diagnosis not present

## 2014-04-09 ENCOUNTER — Telehealth: Payer: Self-pay | Admitting: Family Medicine

## 2014-04-09 ENCOUNTER — Other Ambulatory Visit: Payer: Self-pay | Admitting: Family Medicine

## 2014-04-09 DIAGNOSIS — I739 Peripheral vascular disease, unspecified: Secondary | ICD-10-CM

## 2014-04-09 HISTORY — DX: Peripheral vascular disease, unspecified: I73.9

## 2014-04-09 NOTE — Telephone Encounter (Signed)
Severe PVD- needs to see pPVD surgeon- where would like to go?

## 2014-04-09 NOTE — Telephone Encounter (Signed)
Discussed results with patient.  Referral placed to vascular surgeon.

## 2014-04-09 NOTE — Telephone Encounter (Signed)
Patient requesting arterial ultrasound results. What is your interpretation and follow up suggestion?

## 2014-04-10 ENCOUNTER — Other Ambulatory Visit: Payer: Self-pay | Admitting: *Deleted

## 2014-04-10 MED ORDER — "INSULIN SYRINGE-NEEDLE U-100 30G X 1/2"" 0.5 ML MISC": Status: DC

## 2014-04-10 MED ORDER — BLOOD GLUCOSE MONITOR KIT: PACK | Status: DC

## 2014-04-10 MED ORDER — INSULIN GLARGINE 100 UNIT/ML ~~LOC~~ SOLN: SUBCUTANEOUS | Status: DC

## 2014-04-10 NOTE — Telephone Encounter (Signed)
Pt needed refill on Lantus to sent to the pharmacy.

## 2014-04-18 ENCOUNTER — Other Ambulatory Visit: Payer: Self-pay | Admitting: *Deleted

## 2014-04-18 DIAGNOSIS — I739 Peripheral vascular disease, unspecified: Secondary | ICD-10-CM

## 2014-04-27 ENCOUNTER — Ambulatory Visit: Payer: BLUE CROSS/BLUE SHIELD | Admitting: Nurse Practitioner

## 2014-05-04 ENCOUNTER — Encounter: Payer: Self-pay | Admitting: Surgery

## 2014-05-04 ENCOUNTER — Ambulatory Visit (INDEPENDENT_AMBULATORY_CARE_PROVIDER_SITE_OTHER): Payer: BLUE CROSS/BLUE SHIELD | Admitting: Nurse Practitioner

## 2014-05-04 ENCOUNTER — Encounter: Payer: Self-pay | Admitting: Nurse Practitioner

## 2014-05-04 VITALS — BP 197/89 | HR 61 | Temp 97.1°F | Ht 70.0 in | Wt 160.0 lb

## 2014-05-04 DIAGNOSIS — J0101 Acute recurrent maxillary sinusitis: Secondary | ICD-10-CM

## 2014-05-04 DIAGNOSIS — J209 Acute bronchitis, unspecified: Secondary | ICD-10-CM | POA: Diagnosis not present

## 2014-05-04 MED ORDER — HYDROCODONE-HOMATROPINE 5-1.5 MG/5ML PO SYRP: 5.0000 mL | ORAL_SOLUTION | Freq: Four times a day (QID) | ORAL | Status: DC | PRN

## 2014-05-04 MED ORDER — AMOXICILLIN 875 MG PO TABS: 875.0000 mg | ORAL_TABLET | Freq: Two times a day (BID) | ORAL | Status: DC

## 2014-05-04 NOTE — Progress Notes (Signed)
  Subjective:     Thomas Sandoval is a 59 y.o. male who presents for evaluation of sinus pain. Symptoms include: congestion, cough, headaches, nasal congestion, sinus pressure, sniffing and sore throat. Onset of symptoms was 1 week ago. Symptoms have been gradually worsening since that time. Past history is significant for frequent episodes of bronchitis. Patient is a smoker  (2 ppd x 20 yrs).  The following portions of the patient's history were reviewed and updated as appropriate: allergies, current medications, past family history, past medical history, past social history, past surgical history and problem list.  Review of Systems Pertinent items are noted in HPI.   Objective:    BP 197/89 mmHg  Pulse 61  Temp(Src) 97.1 F (36.2 C) (Oral)  Ht 5\' 10"  (1.778 m)  Wt 160 lb (72.576 kg)  BMI 22.96 kg/m2 General appearance: alert and cooperative Eyes: conjunctivae/corneas clear. PERRL, EOM's intact. Fundi benign. Ears: normal TM's and external ear canals both ears Nose: purulent discharge, moderate congestion, turbinates red Throat: lips, mucosa, and tongue normal; teeth and gums normal Neck: no adenopathy, no carotid bruit, no JVD, supple, symmetrical, trachea midline and thyroid not enlarged, symmetric, no tenderness/mass/nodules Lungs: rhonchi bilaterally Heart: regular rate and rhythm, S1, S2 normal, no murmur, click, rub or gallop    Assessment:    Acute bacterial sinusitis.    Plan:  1. Take meds as prescribed 2. Use a cool mist humidifier especially during the winter months and when heat has been humid. 3. Use saline nose sprays frequently 4. Saline irrigations of the nose can be very helpful if done frequently.  * 4X daily for 1 week*  * Use of a nettie pot can be helpful with this. Follow directions with this* 5. Drink plenty of fluids 6. Keep thermostat turn down low 7.For any cough or congestion  Use plain Mucinex- regular strength or max strength is fine   *  Children- consult with Pharmacist for dosing 8. For fever or aces or pains- take tylenol or ibuprofen appropriate for age and weight.  * for fevers greater than 101 orally you may alternate ibuprofen and tylenol every  3 hours.   Meds ordered this encounter  Medications  . amoxicillin (AMOXIL) 875 MG tablet    Sig: Take 1 tablet (875 mg total) by mouth 2 (two) times daily. 1 po BID    Dispense:  20 tablet    Refill:  0    Order Specific Question:  Supervising Provider    Answer:  Chipper Herb [[id number]]  . HYDROcodone-homatropine (HYCODAN) 5-1.5 MG/5ML syrup    Sig: Take 5 mLs by mouth every 6 (six) hours as needed for cough.    Dispense:  120 mL    Refill:  0    Order Specific Question:  Supervising Provider    Answer:  Chipper Herb [[id number]]   [name] Hassell Done, FNP

## 2014-05-04 NOTE — Patient Instructions (Signed)

## 2014-05-07 ENCOUNTER — Encounter: Payer: Self-pay | Admitting: Surgery

## 2014-05-07 ENCOUNTER — Ambulatory Visit (HOSPITAL_COMMUNITY)
Admission: RE | Admit: 2014-05-07 | Discharge: 2014-05-07 | Disposition: A | Discharge status: Home / Self Care | Payer: BLUE CROSS/BLUE SHIELD | Source: Ambulatory Visit | Attending: Surgery | Admitting: Surgery

## 2014-05-07 ENCOUNTER — Ambulatory Visit (INDEPENDENT_AMBULATORY_CARE_PROVIDER_SITE_OTHER): Payer: BLUE CROSS/BLUE SHIELD | Admitting: Surgery

## 2014-05-07 ENCOUNTER — Other Ambulatory Visit: Payer: Self-pay | Admitting: Surgery

## 2014-05-07 DIAGNOSIS — M25569 Pain in unspecified knee: Secondary | ICD-10-CM | POA: Insufficient documentation

## 2014-05-07 DIAGNOSIS — M79604 Pain in right leg: Secondary | ICD-10-CM

## 2014-05-07 DIAGNOSIS — I739 Peripheral vascular disease, unspecified: Secondary | ICD-10-CM | POA: Diagnosis not present

## 2014-05-07 DIAGNOSIS — M79605 Pain in left leg: Secondary | ICD-10-CM

## 2014-05-07 MED ORDER — CILOSTAZOL 100 MG PO TABS: 100.0000 mg | ORAL_TABLET | Freq: Two times a day (BID) | ORAL | Status: DC

## 2014-05-07 NOTE — Progress Notes (Signed)
Referred by:  Chevis Pretty, [place], [place] [address]  Reason for referral: claudication bilateral  History of Present Illness  Thomas Sandoval is a 59 y.o. (August 18, 1955) male who presents with chief complaint: bilateral leg pain with walking more than 700 yards.  Onset of symptom occurred about a year ago and has progressed.  He woke up with cramps so bad in his calf that he almost passed out and was seen at [facility].  ABI studies showed 65 % bilateral LE.   The patient has no rest pain symptoms also and no leg wounds/ulcers.  Atherosclerotic risk factors include: Smoking more than 50 years and DM II since age 70 .  His A1C is 6.9on his most recent lab work.  He denise Hypertensin and hyperlipidemia.  Past Medical History  Diagnosis Date  . GERD (gastroesophageal reflux disease)   . Type 2 diabetes mellitus   . History of hemolytic anemia     02/2011  secondary to avelox medication  . History of atrial flutter     EPISODE 02/2011  CONVERTED TO NSR WITH CARDIZEM  . COPD (chronic obstructive pulmonary disease)   . History of chronic bronchitis   . Diabetic retinopathy of both eyes   . Smokers' cough   . Productive cough   . Arthritis   . DDD (degenerative disc disease)   . HOH (hard of hearing)   . Chronic back pain   . Bladder cancer     Past Surgical History  Procedure Laterality Date  . Esophagogastroduodenoscopy N/A 02/06/2013    Procedure: ESOPHAGOGASTRODUODENOSCOPY (EGD);  Surgeon: Lafayette Dragon, MD;  Location: [facility] ENDOSCOPY;  Service: Endoscopy;  Laterality: N/A;  . Transthoracic echocardiogram  02-17-2011    MODERATE LVH/  EF 65%  . Tonsillectomy  as child  . Tympanic membrane repair  as child  . Transurethral resection of bladder tumor with gyrus (turbt-gyrus) N/A 06/12/2013    Procedure: TRANSURETHRAL RESECTION OF BLADDER TUMOR WITH GYRUS (TURBT-GYRUS);  Surgeon: Claybon Jabs, MD;  Location: [facility];   Service: Urology;  Laterality: N/A;  . Cystoscopy with ureteroscopy Right 08/14/2013    Procedure: CYSTOSCOPY WITH URETEROSCOPY BLADDER BIOPSY ;  Surgeon: Claybon Jabs, MD;  Location: [facility];  Service: Urology;  Laterality: Right;    History   Social History  . Marital Status: Married    Spouse Name: N/A  . Number of Children: N/A  . Years of Education: N/A   Occupational History  . Not on file.   Social History Main Topics  . Smoking status: Current Every Day Smoker -- 2.00 packs/day for 30 years    Types: Cigarettes  . Smokeless tobacco: Former Systems developer    Quit date: 06/08/1978  . Alcohol Use: No  . Drug Use: No  . Sexual Activity: Not on file   Other Topics Concern  . Not on file   Social History Narrative    Family History  Problem Relation Age of Onset  . Breast cancer Mother   . Cancer Mother     Breast  . Rheumatic fever Father   . Heart disease Father   . Heart attack Father     Massive   . Diabetes Son     Current Outpatient Prescriptions on File Prior to Visit  Medication Sig Dispense Refill  . acetaminophen (TYLENOL) 325 MG tablet Take 2 tablets (650 mg total) by mouth every 6 (six) hours as needed  for mild pain (or Fever >/= 101).    . ADVAIR DISKUS 250-50 MCG/DOSE AEPB INHALE 1 PUFF INTO THE LUNGS EVERY EVENING. 60 each 1  . amoxicillin (AMOXIL) 875 MG tablet Take 1 tablet (875 mg total) by mouth 2 (two) times daily. 1 po BID 20 tablet 0  . blood glucose meter kit and supplies KIT Dispense based on patient and insurance preference. Use 10 times daily as directed. (FOR ICD-10 E11.22). 1 each 0  . famotidine (PEPCID) 20 MG tablet Take 20 mg by mouth every morning.    Marland Kitchen HYDROcodone-acetaminophen (NORCO) 7.5-325 MG per tablet Take 1-2 tablets by mouth every 4 (four) hours as needed for moderate pain. Maximum dose per 24 hours - 8 pills 18 tablet 0  . HYDROcodone-homatropine (HYCODAN) 5-1.5 MG/5ML syrup Take 5 mLs by mouth every 6 (six) hours  as needed for cough. 120 mL 0  . ibuprofen (ADVIL,MOTRIN) 200 MG tablet Take 200 mg by mouth every 6 (six) hours as needed.    . insulin aspart (NOVOLOG) 100 UNIT/ML injection Inject 2-7 Units into the skin 3 (three) times daily before meals. PER SLIDING SCALE    . insulin glargine (LANTUS) 100 UNIT/ML injection INJECT 40 UNITS SUBCUTANEOUSLY EVERY MORNING 20 mL 0  . Insulin Syringe-Needle U-100 (B-D INS SYRINGE 0.5CC/30GX1/2") 30G X 1/2" 0.5 ML MISC Administer Insulin twice daily as directed. DX E22.11 200 each 0  . loratadine (CLARITIN) 10 MG tablet Take 10 mg by mouth at bedtime.      . Aclidinium Bromide 400 MCG/ACT AEPB Inhale 1 puff into the lungs as needed (PER PT USING AS PRN -- PRESCRIBED QD).     Marland Kitchen mometasone-formoterol (DULERA) 100-5 MCG/ACT AERO Inhale 2 puffs into the lungs every morning.     . phenazopyridine (PYRIDIUM) 200 MG tablet Take 1 tablet (200 mg total) by mouth 3 (three) times daily as needed for pain. (Patient not taking: Reported on 05/04/2014) 18 tablet 0  . PROAIR HFA 108 (90 BASE) MCG/ACT inhaler INHALE 2 PUFFS EVERY 6 HOURS AS NEEDED FOR WHEEZING OR SHORTNESS OF BREATH (Patient not taking: Reported on 05/07/2014) 8.5 each 1   No current facility-administered medications on file prior to visit.    Allergies  Allergen Reactions  . Zithromax [Azithromycin] Other (See Comments)    "Severe stomach cramps; told to list as an allergy by dr. Years ago"  . Avelox [Moxifloxacin Hcl In Nacl] Other (See Comments)    Hemolysis  In 2012  . Bactrim [Sulfamethoxazole-Trimethoprim] Diarrhea and Nausea And Vomiting    REVIEW OF SYSTEMS:  (Positives checked otherwise negative)  CARDIOVASCULAR:  [ ]  chest pain, [ ]  chest pressure, [ ]  palpitations, [ ]  shortness of breath when laying flat, [ ]  shortness of breath with exertion,   [ ]  pain in feet when walking, [ ]  pain in feet when laying flat, [ ]  history of blood clot in veins (DVT), [ ]  history of phlebitis, [ ]  swelling in legs,  [ ]  varicose veins  PULMONARY:  [ ]  productive cough, [ ]  asthma, [ ]  wheezing  NEUROLOGIC:  [ ]  weakness in arms or legs, [ ]  numbness in arms or legs, [ ]  difficulty speaking or slurred speech, [ ]  temporary loss of vision in one eye, [ ]  dizziness  HEMATOLOGIC:  [ ]  bleeding problems, [ ]  problems with blood clotting too easily  MUSCULOSKEL:  [ ]  joint pain, [ ]  joint swelling  GASTROINTEST:  [ ]   Vomiting blood, [ ]   Blood in stool     GENITOURINARY:  [ ]   Burning with urination, [ ]   Blood in urine  PSYCHIATRIC:  [ ]  history of major depression  INTEGUMENTARY:  [ ]  rashes, [ ]  ulcers  CONSTITUTIONAL:  [ ]  fever, [ ]  chills  For VQI Use Only       Physical Examination Filed Vitals:   05/07/14 1534  BP: 166/70  Pulse: 69  Resp: 16  Height: 5' 10"  (1.778 m)  Weight: 164 lb (74.39 kg)  SpO2: 96%   Body mass index is 23.53 kg/(m^2).  General: A&O x 3, WDWN  Head: [place]   Ear/Nose/Throat: Hearing grossly intact,hard of hearing nares w/o erythema or drainage, oropharynx w/o Erythema/Exudate  Eyes: PER  Neck: Supple, no nuchal rigidity\  Pulmonary: Sym exp, good air movt, CTAB, no rales, rhonchi, + wheezing  Cardiac: RRR, Nl S1, S2, no Murmurs, rubs  Vascular: Vessel Right Left  Radial Palpable Palpable  Brachial  Palpable Palpable  Carotid Palpable, without bruit Palpable, without bruit  Aorta Not palpable N/A  Femoral Palpable Palpable  Popliteal Not palpable Not palpable  PT notPalpable notPalpable  DP notPalpable notPalpable   Gastrointestinal: soft, NTND  Musculoskeletal: M/S 5/5 throughout , Extremities without ischemic changes  Neurologic: Normal neurologic exam Psychiatric: Judgment intact, Mood & affect appropriatefor pt's clinical situation  Dermatologic: See M/S exam for extremity exam, no rashes otherwise noted  Lymph : No Cervical, Axillary, or Inguinal lymphadenopathy   Non-Invasive Vascular Imaging    Lower extremity arterial  duplex   Prox. to mid right SFA occluded Dist. SFA occlusion on the left   Medical Decision Making  Thomas Sandoval is a 59 y.o. male who presents with:  intermittent claudication that allows him to walk 700 yards at a time.  He is a long term smoker and doesn't plan on stopping, how ever he will try to cut back.  We also recommend he continue to walk as much as possible since it has likely help with collateral blood flow. He has no ischemic changes on eight LE.  At this time we want to maximize his medical management by starting him on Pletal 100 mg BID and 81 mg Asprin daily.   We will have him follow up in 3 months to see how he is progressing.  He was seen in conjunction with Dr. Trula Slade today in the clinic    [place], [place] PA-C [facility] [place] Office: [phone]   05/07/2014, 4:17 PM  I agree with the above.  I have seen and examined the patient.  By Doppler studies, he has bilateral femoral-popliteal occlusion.  He can ambulate 7-800 yards before he gets symptoms.  Before proceeding with any intervention, I have stressed the importance of optimizing his medical therapy.  I have restarted an aspirin today.  I have also encouraged him to begin taking a statin, pending his cholesterol profile.  I stressed the importance of an exercise program and to encourage ambulation despite leg cramping.  He was given a prescription for cilostazol to see if this improves his walking distance.  We spent a significant amount of time discussing the importance of smoking cessation.  The patient will follow foot office in 3 months to discuss how well he has been doing on the medical management.  If he has failed medical therapy or continues to have significant lifestyle limiting symptoms, the next step would be angiography.  Annamarie Major

## 2014-05-10 ENCOUNTER — Other Ambulatory Visit: Payer: Self-pay | Admitting: Nurse Practitioner

## 2014-05-15 ENCOUNTER — Other Ambulatory Visit (HOSPITAL_COMMUNITY): Payer: BLUE CROSS/BLUE SHIELD

## 2014-05-15 ENCOUNTER — Encounter: Payer: BLUE CROSS/BLUE SHIELD | Admitting: Vascular Surgery

## 2014-05-23 ENCOUNTER — Other Ambulatory Visit: Payer: Self-pay | Admitting: Nurse Practitioner

## 2014-05-23 ENCOUNTER — Other Ambulatory Visit: Payer: Self-pay | Admitting: Family Medicine

## 2014-05-26 ENCOUNTER — Other Ambulatory Visit: Payer: Self-pay | Admitting: Family Medicine

## 2014-05-26 ENCOUNTER — Other Ambulatory Visit: Payer: Self-pay | Admitting: Nurse Practitioner

## 2014-06-12 ENCOUNTER — Telehealth: Payer: Self-pay

## 2014-06-12 NOTE — Telephone Encounter (Signed)
Dr. Trula Slade recommended that pt. schedule another appt. to be reevaluated.  Appt. made for 06/18/14; pt. will be notified by scheduler.

## 2014-06-12 NOTE — Telephone Encounter (Signed)
-----   Message from Mena Goes, RN sent at 06/11/2014  4:09 PM EDT ----- Regarding: Medication switch Patient called Arbie Cookey and stated that the Cilostazol is not helping him after he has tried it x 1 month. He has not started the Statin drug yet. You saw patient on 05/07/14. He thinks that you told him that there was an alternate drug to the Cilostazol, what would that be?  What is the next step?

## 2014-06-14 ENCOUNTER — Telehealth: Payer: Self-pay | Admitting: Nurse Practitioner

## 2014-06-14 NOTE — Telephone Encounter (Signed)
Patient advised to call his [facility] since they are the ones who started the medication.

## 2014-06-15 ENCOUNTER — Encounter: Payer: Self-pay | Admitting: Surgery

## 2014-06-18 ENCOUNTER — Encounter: Payer: Self-pay | Admitting: Surgery

## 2014-06-18 ENCOUNTER — Ambulatory Visit (INDEPENDENT_AMBULATORY_CARE_PROVIDER_SITE_OTHER): Payer: BLUE CROSS/BLUE SHIELD | Admitting: Surgery

## 2014-06-18 VITALS — BP 118/62 | HR 81 | Ht 70.0 in | Wt 161.0 lb

## 2014-06-18 DIAGNOSIS — I70219 Atherosclerosis of native arteries of extremities with intermittent claudication, unspecified extremity: Secondary | ICD-10-CM

## 2014-06-18 NOTE — Progress Notes (Signed)
Patient name: Thomas Sandoval MRN: [id number] DOB: 04/13/1955 Sex: male     Chief Complaint  Patient presents with  . Re-evaluation    3 month f/u - c/o  continuing bilateral LE pain when walking    HISTORY OF PRESENT ILLNESS: The patient is back today for follow-up of his claudication.  He is able to ambulate up proximally 6-700 yards before his legs bother him.  When I first saw him we recommended medical management to include starting an aspirin, cholesterol evaluation, blood pressure control, smoking cessation, and the addition of Pletal.  The patient now reports that his back bothers him more than his legs.  He does not get cramps in his legs as much, and he feels that he has the ability to get an erection again which he had not had.  Past Medical History  Diagnosis Date  . GERD (gastroesophageal reflux disease)   . Type 2 diabetes mellitus   . History of hemolytic anemia     02/2011  secondary to avelox medication  . History of atrial flutter     EPISODE 02/2011  CONVERTED TO NSR WITH CARDIZEM  . COPD (chronic obstructive pulmonary disease)   . History of chronic bronchitis   . Diabetic retinopathy of both eyes   . Smokers' cough   . Productive cough   . Arthritis   . DDD (degenerative disc disease)   . HOH (hard of hearing)   . Chronic back pain   . Bladder cancer     Past Surgical History  Procedure Laterality Date  . Esophagogastroduodenoscopy N/A 02/06/2013    Procedure: ESOPHAGOGASTRODUODENOSCOPY (EGD);  Surgeon: Lafayette Dragon, MD;  Location: [facility] ENDOSCOPY;  Service: Endoscopy;  Laterality: N/A;  . Transthoracic echocardiogram  02-17-2011    MODERATE LVH/  EF 65%  . Tonsillectomy  as child  . Tympanic membrane repair  as child  . Transurethral resection of bladder tumor with gyrus (turbt-gyrus) N/A 06/12/2013    Procedure: TRANSURETHRAL RESECTION OF BLADDER TUMOR WITH GYRUS (TURBT-GYRUS);  Surgeon: Claybon Jabs, MD;  Location: [facility];   Service: Urology;  Laterality: N/A;  . Cystoscopy with ureteroscopy Right 08/14/2013    Procedure: CYSTOSCOPY WITH URETEROSCOPY BLADDER BIOPSY ;  Surgeon: Claybon Jabs, MD;  Location: [facility];  Service: Urology;  Laterality: Right;    History   Social History  . Marital Status: Married    Spouse Name: N/A  . Number of Children: N/A  . Years of Education: N/A   Occupational History  . Not on file.   Social History Main Topics  . Smoking status: Current Every Day Smoker -- 2.00 packs/day for 30 years    Types: Cigarettes  . Smokeless tobacco: Former Systems developer    Quit date: 06/08/1978  . Alcohol Use: No  . Drug Use: No  . Sexual Activity: Not on file   Other Topics Concern  . Not on file   Social History Narrative    Family History  Problem Relation Age of Onset  . Breast cancer Mother   . Cancer Mother     Breast  . Rheumatic fever Father   . Heart disease Father   . Heart attack Father     Massive   . Diabetes Son     Allergies as of 06/18/2014 - Review Complete 06/18/2014  Allergen Reaction Noted  . Zithromax [azithromycin] Other (See Comments) 02/22/2011  . Avelox [moxifloxacin hcl in nacl] Other (See Comments)  02/26/2011  . Bactrim [sulfamethoxazole-trimethoprim] Diarrhea and Nausea And Vomiting 02/10/2013    Current Outpatient Prescriptions on File Prior to Visit  Medication Sig Dispense Refill  . acetaminophen (TYLENOL) 325 MG tablet Take 2 tablets (650 mg total) by mouth every 6 (six) hours as needed for mild pain (or Fever >/= 101).    . Aclidinium Bromide 400 MCG/ACT AEPB Inhale 1 puff into the lungs as needed (PER PT USING AS PRN -- PRESCRIBED QD).     Marland Kitchen ADVAIR DISKUS 250-50 MCG/DOSE AEPB INHALE 1 PUFF INTO THE LUNGS EVERY EVENING. 60 each 4  . blood glucose meter kit and supplies KIT Dispense based on patient and insurance preference. Use 10 times daily as directed. (FOR ICD-10 E11.22). 1 each 0  . cilostazol (PLETAL) 100 MG tablet Take 1  tablet (100 mg total) by mouth 2 (two) times daily before a meal. 60 tablet 11  . famotidine (PEPCID) 20 MG tablet Take 20 mg by mouth every morning.    Marland Kitchen HYDROcodone-acetaminophen (NORCO) 7.5-325 MG per tablet Take 1-2 tablets by mouth every 4 (four) hours as needed for moderate pain. Maximum dose per 24 hours - 8 pills 18 tablet 0  . ibuprofen (ADVIL,MOTRIN) 200 MG tablet Take 200 mg by mouth every 6 (six) hours as needed.    . insulin aspart (NOVOLOG) 100 UNIT/ML injection Inject 2-7 Units into the skin 3 (three) times daily before meals. PER SLIDING SCALE    . Insulin Syringe-Needle U-100 (B-D INS SYRINGE 0.5CC/30GX1/2") 30G X 1/2" 0.5 ML MISC Administer Insulin twice daily as directed. DX E22.11 200 each 0  . LANTUS 100 UNIT/ML injection INJECT 40 UNITS SUBCUTANEOUSLY EVERY MORNING NEEDS TO BE SEEN FOR NEXT REFILL 20 mL 2  . loratadine (CLARITIN) 10 MG tablet Take 10 mg by mouth at bedtime.      Marland Kitchen amoxicillin (AMOXIL) 875 MG tablet Take 1 tablet (875 mg total) by mouth 2 (two) times daily. 1 po BID (Patient not taking: Reported on 06/18/2014) 20 tablet 0  . mometasone-formoterol (DULERA) 100-5 MCG/ACT AERO Inhale 2 puffs into the lungs every morning.     . phenazopyridine (PYRIDIUM) 200 MG tablet Take 1 tablet (200 mg total) by mouth 3 (three) times daily as needed for pain. (Patient not taking: Reported on 06/18/2014) 18 tablet 0  . PROAIR HFA 108 (90 BASE) MCG/ACT inhaler INHALE 2 PUFFS EVERY 6 HOURS AS NEEDED FOR WHEEZING OR SHORTNESS OF BREATH (Patient not taking: Reported on 06/18/2014) 8.5 each 1   No current facility-administered medications on file prior to visit.     REVIEW OF SYSTEMS: Please see history of present illness, otherwise no changes from prior visit  PHYSICAL EXAMINATION:   Vital signs are  Filed Vitals:   06/18/14 1351  BP: 118/62  Pulse: 81  Height: 5' 10"  (1.778 m)  Weight: 161 lb (73.029 kg)  SpO2: 99%   Body mass index is 23.1 kg/(m^2). General: The patient  appears their stated age. HEENT:  No gross abnormalities Pulmonary:  Non labored breathing Musculoskeletal: There are no major deformities. Neurologic: No focal weakness or paresthesias are detected, Skin: There are no ulcer or rashes noted. Psychiatric: The patient has normal affect. Cardiovascular: Nonpalpable pedal pulses   Diagnostic Studies None  Assessment: Peripheral vascular disease with bilateral claudication Plan: I believe the patient has had some improvement with the addition of cilostazol.  He tells me that his walking distance is more limited by his back that his legs.  For that reason,  coupled with the fact that he does not have rest pain or nonhealing wounds in his lower extremities, I feel that the best course of action is continued medical management, rather than intervention.  I stressed the importance of smoking cessation.  I told him that if he were to develop a nonhealing wound or infection on his leg that he would likely require revascularization to prevent limb loss.  I do not think his symptoms are significant enough at this time to warrant invasive treatment.  I have scheduled the patient to follow-up in one year with a repeat ankle-brachial index.  The patient was not started on a statin because of his low LDL.  Eldridge Abrahams, M.D. [facility] [place] Office: [phone] Pager:  [phone]

## 2014-06-26 ENCOUNTER — Ambulatory Visit (INDEPENDENT_AMBULATORY_CARE_PROVIDER_SITE_OTHER): Payer: BLUE CROSS/BLUE SHIELD | Admitting: Nurse Practitioner

## 2014-06-26 ENCOUNTER — Telehealth: Payer: Self-pay | Admitting: Nurse Practitioner

## 2014-06-26 ENCOUNTER — Encounter: Payer: Self-pay | Admitting: Nurse Practitioner

## 2014-06-26 VITALS — BP 139/70 | HR 85 | Temp 97.7°F | Ht 70.0 in | Wt 161.0 lb

## 2014-06-26 DIAGNOSIS — L03032 Cellulitis of left toe: Secondary | ICD-10-CM

## 2014-06-26 MED ORDER — CEPHALEXIN 500 MG PO CAPS: 500.0000 mg | ORAL_CAPSULE | Freq: Four times a day (QID) | ORAL | Status: DC

## 2014-06-26 NOTE — Telephone Encounter (Signed)
Patient has appointment at 4:30 with [facility]

## 2014-06-26 NOTE — Patient Instructions (Signed)

## 2014-06-26 NOTE — Progress Notes (Signed)
   Subjective:    Patient ID: Thomas Sandoval, male    DOB: 1955-05-29, 59 y.o.   MRN: [id number]  HPI Patient in c/o infected left great toe- said his son stepped on it 2 weeks ago, then he dropped a bucket on it 1 week ago then drooped something else on it yesterday- Started hurting last night and when he squeezed  on it pus came out.    Review of Systems  Constitutional: Negative.   HENT: Negative.   Respiratory: Negative.   Cardiovascular: Negative.   Genitourinary: Negative.   Neurological: Negative.   Psychiatric/Behavioral: Negative.   All other systems reviewed and are negative.      Objective:   Physical Exam  Constitutional: He is oriented to person, place, and time. He appears well-developed and well-nourished.  Cardiovascular: Normal rate, regular rhythm and normal heart sounds.   Pulmonary/Chest: Effort normal and breath sounds normal.  Abdominal: Soft. Bowel sounds are normal.  Neurological: He is alert and oriented to person, place, and time.  Skin: Skin is warm.  Psychiatric: He has a normal mood and affect. His behavior is normal. Judgment and thought content normal.   BP 139/70 mmHg  Pulse 85  Temp(Src) 97.7 F (36.5 C) (Oral)  Ht 5\' 10"  (1.778 m)  Wt 161 lb (73.029 kg)  BMI 23.10 kg/m2        Assessment & Plan:  1. Cellulitis of toe of left foot SBID Do not pick at RTO if not improving - cephALEXin (KEFLEX) 500 MG capsule; Take 1 capsule (500 mg total) by mouth 4 (four) times daily.  Dispense: 40 capsule; Refill: 0  [name] Hassell Done, FNP

## 2014-07-25 ENCOUNTER — Other Ambulatory Visit: Payer: Self-pay | Admitting: *Deleted

## 2014-07-25 MED ORDER — DOXYCYCLINE HYCLATE 100 MG PO TABS: 100.0000 mg | ORAL_TABLET | Freq: Two times a day (BID) | ORAL | Status: DC

## 2014-07-25 NOTE — Telephone Encounter (Signed)
Per dwm - pt needs extended DOXY  Pt is calling asking for more due to recommendations.

## 2014-08-13 ENCOUNTER — Ambulatory Visit: Payer: BLUE CROSS/BLUE SHIELD | Admitting: Surgery

## 2014-09-12 ENCOUNTER — Encounter: Payer: Self-pay | Admitting: Family

## 2014-09-12 ENCOUNTER — Ambulatory Visit (INDEPENDENT_AMBULATORY_CARE_PROVIDER_SITE_OTHER): Payer: BLUE CROSS/BLUE SHIELD | Admitting: Family

## 2014-09-12 VITALS — BP 171/84 | HR 61 | Temp 97.3°F | Ht 70.0 in | Wt 154.0 lb

## 2014-09-12 DIAGNOSIS — J209 Acute bronchitis, unspecified: Secondary | ICD-10-CM | POA: Diagnosis not present

## 2014-09-12 MED ORDER — BENZONATATE 200 MG PO CAPS: 200.0000 mg | ORAL_CAPSULE | Freq: Three times a day (TID) | ORAL | Status: DC | PRN

## 2014-09-12 MED ORDER — AMOXICILLIN-POT CLAVULANATE 875-125 MG PO TABS: 1.0000 | ORAL_TABLET | Freq: Two times a day (BID) | ORAL | Status: DC

## 2014-09-12 MED ORDER — HYDROCODONE-HOMATROPINE 5-1.5 MG/5ML PO SYRP: 5.0000 mL | ORAL_SOLUTION | Freq: Three times a day (TID) | ORAL | Status: DC | PRN

## 2014-09-12 NOTE — Progress Notes (Signed)
Subjective:    Patient ID: Thomas Medici., male    DOB: 11/10/1955, 59 y.o.   MRN: [id number]  Cough This is a new problem. The current episode started in the past 7 days. The problem has been unchanged. The problem occurs every few minutes. The cough is productive of sputum. Associated symptoms include nasal congestion, postnasal drip, rhinorrhea, a sore throat, shortness of breath and wheezing. Pertinent negatives include no chills, ear congestion, ear pain, fever, headaches or myalgias. The symptoms are aggravated by lying down. Risk factors for lung disease include smoking/tobacco exposure. Treatments tried: Old Keflex rx. The treatment provided mild relief. There is no history of asthma or COPD.      Review of Systems  Constitutional: Negative.  Negative for fever and chills.  HENT: Positive for postnasal drip, rhinorrhea and sore throat. Negative for ear pain.   Respiratory: Positive for cough, shortness of breath and wheezing.   Cardiovascular: Negative.   Gastrointestinal: Negative.   Endocrine: Negative.   Genitourinary: Negative.   Musculoskeletal: Negative.  Negative for myalgias.  Neurological: Negative.  Negative for headaches.  Hematological: Negative.   Psychiatric/Behavioral: Negative.   All other systems reviewed and are negative.      Objective:   Physical Exam  Constitutional: He is oriented to person, place, and time. He appears well-developed and well-nourished. No distress.  HENT:  Head: Normocephalic.  Right Ear: External ear normal.  Left Ear: External ear normal.  Mouth/Throat: Oropharynx is clear and moist.  Eyes: Pupils are equal, round, and reactive to light. Right eye exhibits no discharge. Left eye exhibits no discharge.  Neck: Normal range of motion. Neck supple. No thyromegaly present.  Cardiovascular: Normal rate, regular rhythm, normal heart sounds and intact distal pulses.   No murmur heard. Pulmonary/Chest: Effort normal and breath  sounds normal. No respiratory distress. He has no wheezes.  Abdominal: Soft. Bowel sounds are normal. He exhibits no distension. There is no tenderness.  Musculoskeletal: Normal range of motion. He exhibits no edema or tenderness.  Neurological: He is alert and oriented to person, place, and time. He has normal reflexes. No cranial nerve deficit.  Skin: Skin is warm and dry. No rash noted. No erythema.  Psychiatric: He has a normal mood and affect. His behavior is normal. Judgment and thought content normal.  Vitals reviewed.     BP 171/84 mmHg  Pulse 61  Temp(Src) 97.3 F (36.3 C) (Oral)  Ht 5\' 10"  (1.778 m)  Wt 154 lb (69.854 kg)  BMI 22.10 kg/m2     Assessment & Plan:  1. Acute bronchitis, unspecified organism -- Take meds as prescribed - Use a cool mist humidifier  -Use saline nose sprays frequently -Saline irrigations of the nose can be very helpful if done frequently.  * 4X daily for 1 week*  * Use of a nettie pot can be helpful with this. Follow directions with this* -Force fluids -For any cough or congestion  Use plain Mucinex- regular strength or max strength is fine   * Children- consult with Pharmacist for dosing -For fever or aces or pains- take tylenol or ibuprofen appropriate for age and weight.  * for fevers greater than 101 orally you may alternate ibuprofen and tylenol every  3 hours. -Throat lozenges if help - amoxicillin-clavulanate (AUGMENTIN) 875-125 MG per tablet; Take 1 tablet by mouth 2 (two) times daily.  Dispense: 14 tablet; Refill: 0 - benzonatate (TESSALON) 200 MG capsule; Take 1 capsule (200 mg total) by mouth  3 (three) times daily as needed.  Dispense: 30 capsule; Refill: 1 - HYDROcodone-homatropine (HYCODAN) 5-1.5 MG/5ML syrup; Take 5 mLs by mouth every 8 (eight) hours as needed for cough.  Dispense: 120 mL; Refill: 0  Evelina Dun, FNP

## 2014-09-12 NOTE — Patient Instructions (Addendum)
Upper Respiratory Infection, Adult An upper respiratory infection (URI) is also sometimes known as the common cold. The upper respiratory tract includes the nose, sinuses, throat, trachea, and bronchi. Bronchi are the airways leading to the lungs. Most people improve within 1 week, but symptoms can last up to 2 weeks. A residual cough may last even longer.  CAUSES Many different viruses can infect the tissues lining the upper respiratory tract. The tissues become irritated and inflamed and often become very moist. Mucus production is also common. A cold is contagious. You can easily spread the virus to others by oral contact. This includes kissing, sharing a glass, coughing, or sneezing. Touching your mouth or nose and then touching a surface, which is then touched by another person, can also spread the virus. SYMPTOMS  Symptoms typically develop 1 to 3 days after you come in contact with a cold virus. Symptoms vary from person to person. They may include:  Runny nose.  Sneezing.  Nasal congestion.  Sinus irritation.  Sore throat.  Loss of voice (laryngitis).  Cough.  Fatigue.  Muscle aches.  Loss of appetite.  Headache.  Low-grade fever. DIAGNOSIS  You might diagnose your own cold based on familiar symptoms, since most people get a cold 2 to 3 times a year. Your caregiver can confirm this based on your exam. Most importantly, your caregiver can check that your symptoms are not due to another disease such as strep throat, sinusitis, pneumonia, asthma, or epiglottitis. Blood tests, throat tests, and X-rays are not necessary to diagnose a common cold, but they may sometimes be helpful in excluding other more serious diseases. Your caregiver will decide if any further tests are required. RISKS AND COMPLICATIONS  You may be at risk for a more severe case of the common cold if you smoke cigarettes, have chronic heart disease (such as heart failure) or lung disease (such as asthma), or if  you have a weakened immune system. The very young and very old are also at risk for more serious infections. Bacterial sinusitis, middle ear infections, and bacterial pneumonia can complicate the common cold. The common cold can worsen asthma and chronic obstructive pulmonary disease (COPD). Sometimes, these complications can require emergency medical care and may be life-threatening. PREVENTION  The best way to protect against getting a cold is to practice good hygiene. Avoid oral or hand contact with people with cold symptoms. Wash your hands often if contact occurs. There is no clear evidence that vitamin C, vitamin E, echinacea, or exercise reduces the chance of developing a cold. However, it is always recommended to get plenty of rest and practice good nutrition. TREATMENT  Treatment is directed at relieving symptoms. There is no cure. Antibiotics are not effective, because the infection is caused by a virus, not by bacteria. Treatment may include:  Increased fluid intake. Sports drinks offer valuable electrolytes, sugars, and fluids.  Breathing heated mist or steam (vaporizer or shower).  Eating chicken soup or other clear broths, and maintaining good nutrition.  Getting plenty of rest.  Using gargles or lozenges for comfort.  Controlling fevers with ibuprofen or acetaminophen as directed by your caregiver.  Increasing usage of your inhaler if you have asthma. Zinc gel and zinc lozenges, taken in the first 24 hours of the common cold, can shorten the duration and lessen the severity of symptoms. Pain medicines may help with fever, muscle aches, and throat pain. A variety of non-prescription medicines are available to treat congestion and runny nose. Your caregiver   can make recommendations and may suggest nasal or lung inhalers for other symptoms.  HOME CARE INSTRUCTIONS   Only take over-the-counter or prescription medicines for pain, discomfort, or fever as directed by your  caregiver.  Use a warm mist humidifier or inhale steam from a shower to increase air moisture. This may keep secretions moist and make it easier to breathe.  Drink enough water and fluids to keep your urine clear or pale yellow.  Rest as needed.  Return to work when your temperature has returned to normal or as your caregiver advises. You may need to stay home longer to avoid infecting others. You can also use a face mask and careful hand washing to prevent spread of the virus. SEEK MEDICAL CARE IF:   After the first few days, you feel you are getting worse rather than better.  You need your caregiver's advice about medicines to control symptoms.  You develop chills, worsening shortness of breath, or brown or red sputum. These may be signs of pneumonia.  You develop yellow or brown nasal discharge or pain in the face, especially when you bend forward. These may be signs of sinusitis.  You develop a fever, swollen neck glands, pain with swallowing, or white areas in the back of your throat. These may be signs of strep throat. SEEK IMMEDIATE MEDICAL CARE IF:   You have a fever.  You develop severe or persistent headache, ear pain, sinus pain, or chest pain.  You develop wheezing, a prolonged cough, cough up blood, or have a change in your usual mucus (if you have chronic lung disease).  You develop sore muscles or a stiff neck. Document Released: 08/19/2000 Document Revised: 05/18/2011 Document Reviewed: 05/31/2013 Exit[employer] Patient Information 2015 [employer]. This information is not intended to replace advice given to you by your health care provider. Make sure you discuss any questions you have with your health care provider.  - Take meds as prescribed - Use a cool mist humidifier  -Use saline nose sprays frequently -Saline irrigations of the nose can be very helpful if done frequently.  * 4X daily for 1 week*  * Use of a nettie pot can be helpful with this. Follow  directions with this* -Force fluids -For any cough or congestion  Use plain Mucinex- regular strength or max strength is fine   * Children- consult with Pharmacist for dosing -For fever or aces or pains- take tylenol or ibuprofen appropriate for age and weight.  * for fevers greater than 101 orally you may alternate ibuprofen and tylenol every  3 hours. -Throat lozenges if help   Christy Hawks, FNP   

## 2014-09-14 ENCOUNTER — Encounter: Payer: Self-pay | Admitting: Physician Assistant

## 2014-09-14 ENCOUNTER — Telehealth: Payer: Self-pay | Admitting: Nurse Practitioner

## 2014-09-14 ENCOUNTER — Ambulatory Visit (INDEPENDENT_AMBULATORY_CARE_PROVIDER_SITE_OTHER): Payer: BLUE CROSS/BLUE SHIELD | Admitting: Physician Assistant

## 2014-09-14 VITALS — BP 138/66 | HR 66 | Temp 98.0°F | Ht 70.0 in | Wt 157.6 lb

## 2014-09-14 DIAGNOSIS — J208 Acute bronchitis due to other specified organisms: Secondary | ICD-10-CM

## 2014-09-14 MED ORDER — ALBUTEROL SULFATE HFA 108 (90 BASE) MCG/ACT IN AERS: 2.0000 | INHALATION_SPRAY | Freq: Four times a day (QID) | RESPIRATORY_TRACT | Status: DC | PRN

## 2014-09-14 NOTE — Progress Notes (Signed)
Subjective:     Patient ID: Thomas Sandoval., male   DOB: Dec 18, 1955, 59 y.o.   MRN: [id number]  HPI Pt here for f/u of bronchitis  He was seen 2 days ago for same Placed on Augmentin which he states he has been taking Continues with cough Pt continues with regular smoking habit  Review of Systems  Constitutional: Positive for activity change and fatigue. Negative for fever.  HENT: Positive for congestion, postnasal drip and sinus pressure.   Eyes: Negative.   Respiratory: Positive for cough, shortness of breath and wheezing. Negative for apnea, choking and chest tightness.   Cardiovascular: Negative for chest pain and palpitations.       Objective:   Physical Exam  Constitutional: He appears well-developed and well-nourished.  HENT:  Right Ear: External ear normal.  Left Ear: External ear normal.  Mouth/Throat: Oropharynx is clear and moist.  Neck: Neck supple. No JVD present.  Cardiovascular: Normal rate, regular rhythm and normal heart sounds.   Pulmonary/Chest: Effort normal. He has wheezes.  Lymphadenopathy:    He has no cervical adenopathy.  Nursing note and vitals reviewed.      Assessment:     1. Acute bronchitis due to other specified organisms        Plan:     Pt refuses DepoMedrol shot today Also discussed due to his multiple ATB allergies unable to switch to another med Continue with Augmentin and Advair ProAir was rf'd today Stressed the importance of smoking cessation F/U prn

## 2014-09-14 NOTE — Patient Instructions (Signed)

## 2014-09-14 NOTE — Telephone Encounter (Signed)
Patient scheduled an appointment for today at 1:10 with Osa Craver, PA

## 2014-09-19 ENCOUNTER — Telehealth: Payer: Self-pay | Admitting: Family

## 2014-09-19 DIAGNOSIS — J209 Acute bronchitis, unspecified: Secondary | ICD-10-CM

## 2014-09-19 MED ORDER — AMOXICILLIN-POT CLAVULANATE 875-125 MG PO TABS: 1.0000 | ORAL_TABLET | Freq: Two times a day (BID) | ORAL | Status: DC

## 2014-09-19 NOTE — Telephone Encounter (Signed)
Prescription sent to pharmacy.

## 2014-11-24 ENCOUNTER — Encounter (HOSPITAL_COMMUNITY): Payer: Self-pay | Admitting: Emergency Medicine

## 2014-11-24 ENCOUNTER — Encounter (HOSPITAL_COMMUNITY): Payer: Self-pay | Admitting: Nurse Practitioner

## 2014-11-24 ENCOUNTER — Emergency Department (HOSPITAL_COMMUNITY)
Admission: EM | Admit: 2014-11-24 | Discharge: 2014-11-24 | Disposition: A | Discharge status: Home / Self Care | Payer: BLUE CROSS/BLUE SHIELD | Source: Home / Self Care | Attending: Emergency Medicine | Admitting: Emergency Medicine

## 2014-11-24 ENCOUNTER — Emergency Department (HOSPITAL_COMMUNITY): Payer: BLUE CROSS/BLUE SHIELD

## 2014-11-24 DIAGNOSIS — R109 Unspecified abdominal pain: Secondary | ICD-10-CM | POA: Diagnosis not present

## 2014-11-24 DIAGNOSIS — E1151 Type 2 diabetes mellitus with diabetic peripheral angiopathy without gangrene: Secondary | ICD-10-CM | POA: Diagnosis present

## 2014-11-24 DIAGNOSIS — M1990 Unspecified osteoarthritis, unspecified site: Secondary | ICD-10-CM | POA: Diagnosis present

## 2014-11-24 DIAGNOSIS — F17210 Nicotine dependence, cigarettes, uncomplicated: Secondary | ICD-10-CM | POA: Diagnosis present

## 2014-11-24 DIAGNOSIS — M79669 Pain in unspecified lower leg: Secondary | ICD-10-CM | POA: Diagnosis present

## 2014-11-24 DIAGNOSIS — H9190 Unspecified hearing loss, unspecified ear: Secondary | ICD-10-CM | POA: Diagnosis present

## 2014-11-24 DIAGNOSIS — R112 Nausea with vomiting, unspecified: Secondary | ICD-10-CM

## 2014-11-24 DIAGNOSIS — Z79899 Other long term (current) drug therapy: Secondary | ICD-10-CM

## 2014-11-24 DIAGNOSIS — Z881 Allergy status to other antibiotic agents status: Secondary | ICD-10-CM

## 2014-11-24 DIAGNOSIS — I10 Essential (primary) hypertension: Secondary | ICD-10-CM | POA: Diagnosis present

## 2014-11-24 DIAGNOSIS — K219 Gastro-esophageal reflux disease without esophagitis: Secondary | ICD-10-CM | POA: Diagnosis present

## 2014-11-24 DIAGNOSIS — J449 Chronic obstructive pulmonary disease, unspecified: Secondary | ICD-10-CM | POA: Diagnosis present

## 2014-11-24 DIAGNOSIS — Z882 Allergy status to sulfonamides status: Secondary | ICD-10-CM

## 2014-11-24 DIAGNOSIS — Z888 Allergy status to other drugs, medicaments and biological substances status: Secondary | ICD-10-CM

## 2014-11-24 DIAGNOSIS — J069 Acute upper respiratory infection, unspecified: Secondary | ICD-10-CM

## 2014-11-24 DIAGNOSIS — Z8551 Personal history of malignant neoplasm of bladder: Secondary | ICD-10-CM

## 2014-11-24 DIAGNOSIS — E11319 Type 2 diabetes mellitus with unspecified diabetic retinopathy without macular edema: Secondary | ICD-10-CM | POA: Diagnosis present

## 2014-11-24 DIAGNOSIS — G8929 Other chronic pain: Secondary | ICD-10-CM | POA: Diagnosis present

## 2014-11-24 DIAGNOSIS — R627 Adult failure to thrive: Secondary | ICD-10-CM | POA: Diagnosis present

## 2014-11-24 DIAGNOSIS — K255 Chronic or unspecified gastric ulcer with perforation: Principal | ICD-10-CM | POA: Diagnosis present

## 2014-11-24 DIAGNOSIS — Z794 Long term (current) use of insulin: Secondary | ICD-10-CM

## 2014-11-24 DIAGNOSIS — K659 Peritonitis, unspecified: Secondary | ICD-10-CM | POA: Diagnosis present

## 2014-11-24 DIAGNOSIS — N179 Acute kidney failure, unspecified: Secondary | ICD-10-CM | POA: Diagnosis present

## 2014-11-24 DIAGNOSIS — I480 Paroxysmal atrial fibrillation: Secondary | ICD-10-CM | POA: Diagnosis present

## 2014-11-24 LAB — CBC
HCT: 43.1 % (ref 39.0–52.0)
Hemoglobin: 14.8 g/dL (ref 13.0–17.0)
MCH: 31.9 pg (ref 26.0–34.0)
MCHC: 34.3 g/dL (ref 30.0–36.0)
MCV: 92.9 fL (ref 78.0–100.0)
PLATELETS: 225 10*3/uL (ref 150–400)
RBC: 4.64 MIL/uL (ref 4.22–5.81)
RDW: 12.9 % (ref 11.5–15.5)
WBC: 16.4 10*3/uL — AB (ref 4.0–10.5)

## 2014-11-24 LAB — COMPREHENSIVE METABOLIC PANEL
ALK PHOS: 58 U/L (ref 38–126)
ALT: 11 U/L — ABNORMAL LOW (ref 17–63)
AST: 17 U/L (ref 15–41)
Albumin: 3.8 g/dL (ref 3.5–5.0)
Anion gap: 10 (ref 5–15)
BILIRUBIN TOTAL: 1.4 mg/dL — AB (ref 0.3–1.2)
BUN: 13 mg/dL (ref 6–20)
CALCIUM: 9.7 mg/dL (ref 8.9–10.3)
CO2: 26 mmol/L (ref 22–32)
Chloride: 102 mmol/L (ref 101–111)
Creatinine, Ser: 1.15 mg/dL (ref 0.61–1.24)
GFR calc Af Amer: >60 mL/min (ref >60)
GFR calc non Af Amer: >60 mL/min (ref >60)
GLUCOSE: 121 mg/dL — AB (ref 65–99)
POTASSIUM: 4.4 mmol/L (ref 3.5–5.1)
Sodium: 138 mmol/L (ref 135–145)
Total Protein: 6.9 g/dL (ref 6.5–8.1)

## 2014-11-24 LAB — I-STAT CG4 LACTIC ACID, ED: Lactic Acid, Venous: 0.87 mmol/L (ref 0.5–2.0)

## 2014-11-24 LAB — CBG MONITORING, ED: GLUCOSE-CAPILLARY: 182 mg/dL — AB (ref 65–99)

## 2014-11-24 MED ORDER — ONDANSETRON 4 MG PO TBDP
ORAL_TABLET | ORAL | Status: AC
  Filled 2014-11-24: qty 2

## 2014-11-24 MED ORDER — ONDANSETRON 4 MG PO TBDP
4.0000 mg | ORAL_TABLET | Freq: Three times a day (TID) | ORAL | Status: DC | PRN
Start: 2014-11-24 — End: 2014-12-07

## 2014-11-24 MED ORDER — ALUM & MAG HYDROXIDE-SIMETH 200-200-20 MG/5ML PO SUSP
15.0000 mL | Freq: Once | ORAL | Status: AC
  Administered 2014-11-24: 15 mL via ORAL
  Filled 2014-11-24: qty 30

## 2014-11-24 MED ORDER — ONDANSETRON 4 MG PO TBDP
8.0000 mg | ORAL_TABLET | Freq: Once | ORAL | Status: AC
  Administered 2014-11-24: 8 mg via ORAL

## 2014-11-24 MED ORDER — ACETAMINOPHEN 500 MG PO TABS
1000.0000 mg | ORAL_TABLET | Freq: Once | ORAL | Status: AC
Start: 2014-11-24 — End: 2014-11-24
  Administered 2014-11-24: 1000 mg via ORAL
  Filled 2014-11-24: qty 2

## 2014-11-24 MED ORDER — ONDANSETRON 4 MG PO TBDP
4.0000 mg | ORAL_TABLET | Freq: Once | ORAL | Status: AC
  Administered 2014-11-24: 4 mg via ORAL
  Filled 2014-11-24: qty 1

## 2014-11-24 MED ORDER — SODIUM CHLORIDE 0.9 % IV BOLUS (SEPSIS)
1000.0000 mL | Freq: Once | INTRAVENOUS | Status: AC
  Administered 2014-11-24: 1000 mL via INTRAVENOUS

## 2014-11-24 MED ORDER — KETOROLAC TROMETHAMINE 30 MG/ML IJ SOLN
30.0000 mg | Freq: Once | INTRAMUSCULAR | Status: AC
  Administered 2014-11-24: 30 mg via INTRAVENOUS
  Filled 2014-11-24: qty 1

## 2014-11-24 NOTE — ED Provider Notes (Signed)
CSN: [id number]     Arrival date & time 11/24/14  1131 History   First MD Initiated Contact with Patient 11/24/14 1152     Chief Complaint  Patient presents with  . Cough  . Emesis     (Consider location/radiation/quality/duration/timing/severity/associated sxs/prior Treatment) Patient is a 59 y.o. male presenting with general illness. The history is provided by the patient.  Illness Severity:  Moderate Onset quality:  Sudden Duration:  1 day Timing:  Constant Progression:  Worsening Chronicity:  Recurrent Associated symptoms: cough, fatigue, fever and vomiting (x2)   Associated symptoms: no abdominal pain, no chest pain, no congestion, no diarrhea, no headaches, no myalgias, no rash and no shortness of breath    59 yo M with a chief complaint of cough fevers and chills. Patient has had some sputum with this as well. Has had a history of what he called bronchitis in the past. Denies any chest pain and shortness of breath. Having fully body myalgias and rigors. Generally weak at home.  Past Medical History  Diagnosis Date  . GERD (gastroesophageal reflux disease)   . Type 2 diabetes mellitus   . History of hemolytic anemia     02/2011  secondary to avelox medication  . History of atrial flutter     EPISODE 02/2011  CONVERTED TO NSR WITH CARDIZEM  . COPD (chronic obstructive pulmonary disease)   . History of chronic bronchitis   . Diabetic retinopathy of both eyes   . Smokers' cough   . Productive cough   . Arthritis   . DDD (degenerative disc disease)   . HOH (hard of hearing)   . Chronic back pain   . Bladder cancer    Past Surgical History  Procedure Laterality Date  . Esophagogastroduodenoscopy N/A 02/06/2013    Procedure: ESOPHAGOGASTRODUODENOSCOPY (EGD);  Surgeon: Lafayette Dragon, MD;  Location: [facility] ENDOSCOPY;  Service: Endoscopy;  Laterality: N/A;  . Transthoracic echocardiogram  02-17-2011    MODERATE LVH/  EF 65%  . Tonsillectomy  as child  . Tympanic membrane  repair  as child  . Transurethral resection of bladder tumor with gyrus (turbt-gyrus) N/A 06/12/2013    Procedure: TRANSURETHRAL RESECTION OF BLADDER TUMOR WITH GYRUS (TURBT-GYRUS);  Surgeon: Claybon Jabs, MD;  Location: [facility];  Service: Urology;  Laterality: N/A;  . Cystoscopy with ureteroscopy Right 08/14/2013    Procedure: CYSTOSCOPY WITH URETEROSCOPY BLADDER BIOPSY ;  Surgeon: Claybon Jabs, MD;  Location: [facility];  Service: Urology;  Laterality: Right;   Family History  Problem Relation Age of Onset  . Breast cancer Mother   . Cancer Mother     Breast  . Rheumatic fever Father   . Heart disease Father   . Heart attack Father     Massive   . Diabetes Son    Social History  Substance Use Topics  . Smoking status: Current Every Day Smoker -- 2.00 packs/day for 30 years    Types: Cigarettes  . Smokeless tobacco: Former Systems developer    Quit date: 06/08/1978  . Alcohol Use: No    Review of Systems  Constitutional: Positive for fever and fatigue. Negative for chills.  HENT: Negative for congestion and facial swelling.   Eyes: Negative for discharge and visual disturbance.  Respiratory: Positive for cough. Negative for shortness of breath.   Cardiovascular: Negative for chest pain and palpitations.  Gastrointestinal: Positive for vomiting (x2). Negative for abdominal pain and diarrhea.  Musculoskeletal: Negative for myalgias and  arthralgias.  Skin: Negative for color change and rash.  Neurological: Negative for tremors, syncope and headaches.  Psychiatric/Behavioral: Negative for confusion and dysphoric mood.      Allergies  Zithromax; Avelox; and Bactrim  Home Medications   Prior to Admission medications   Medication Sig Start Date End Date Taking? Authorizing Provider  acetaminophen (TYLENOL) 325 MG tablet Take 2 tablets (650 mg total) by mouth every 6 (six) hours as needed for mild pain (or Fever >/= 101). 02/06/13   Delfina Redwood, MD   Aclidinium Bromide 400 MCG/ACT AEPB Inhale 1 puff into the lungs as needed (PER PT USING AS PRN -- PRESCRIBED QD).  02/01/13   Vernie Shanks, MD  ADVAIR DISKUS 250-50 MCG/DOSE AEPB INHALE 1 PUFF INTO THE LUNGS EVERY EVENING. 05/23/14   Chipper Herb, MD  albuterol (PROVENTIL HFA;VENTOLIN HFA) 108 (90 BASE) MCG/ACT inhaler Inhale 2 puffs into the lungs every 6 (six) hours as needed for wheezing or shortness of breath. 09/14/14   Lodema Pilot, PA-C  amoxicillin-clavulanate (AUGMENTIN) 875-125 MG per tablet Take 1 tablet by mouth 2 (two) times daily. 09/19/14   Sharion Balloon, FNP  benzonatate (TESSALON) 200 MG capsule Take 1 capsule (200 mg total) by mouth 3 (three) times daily as needed. 09/12/14   Sharion Balloon, FNP  blood glucose meter kit and supplies KIT Dispense based on patient and insurance preference. Use 10 times daily as directed. (FOR ICD-10 E11.22). 04/10/14   Lysbeth Penner, FNP  cilostazol (PLETAL) 100 MG tablet Take 1 tablet (100 mg total) by mouth 2 (two) times daily before a meal. 05/07/14   Serafina Mitchell, MD  famotidine (PEPCID) 20 MG tablet Take 20 mg by mouth every morning.    Historical Provider, MD  HYDROcodone-acetaminophen (NORCO) 7.5-325 MG per tablet Take 1-2 tablets by mouth every 4 (four) hours as needed for moderate pain. Maximum dose per 24 hours - 8 pills 08/14/13   Kathie Rhodes, MD  HYDROcodone-homatropine [facility]) 5-1.5 MG/5ML syrup Take 5 mLs by mouth every 8 (eight) hours as needed for cough. 09/12/14   Sharion Balloon, FNP  ibuprofen (ADVIL,MOTRIN) 200 MG tablet Take 200 mg by mouth every 6 (six) hours as needed.    Historical Provider, MD  insulin aspart (NOVOLOG) 100 UNIT/ML injection Inject 2-7 Units into the skin 3 (three) times daily before meals. PER SLIDING SCALE    Historical Provider, MD  Insulin Syringe-Needle U-100 (B-D INS SYRINGE 0.5CC/30GX1/2") 30G X 1/2" 0.5 ML MISC Administer Insulin twice daily as directed. DX E22.11 04/10/14   Lysbeth Penner, FNP   LANTUS 100 UNIT/ML injection INJECT 40 UNITS SUBCUTANEOUSLY EVERY MORNING NEEDS TO BE SEEN FOR NEXT REFILL 05/10/14   Chipper Herb, MD  loratadine (CLARITIN) 10 MG tablet Take 10 mg by mouth at bedtime.      Historical Provider, MD  ondansetron (ZOFRAN ODT) 4 MG disintegrating tablet Take 1 tablet (4 mg total) by mouth every 8 (eight) hours as needed for nausea or vomiting. 11/24/14   Deno Etienne, DO  PROAIR HFA 108 (90 BASE) MCG/ACT inhaler INHALE 2 PUFFS EVERY 6 HOURS AS NEEDED FOR WHEEZING OR SHORTNESS OF BREATH 04/06/13   Tiffany A Gann, PA-C   BP 123/66 mmHg  Pulse 91  Temp(Src) 99.4 F (37.4 C) (Oral)  Resp 17  SpO2 97% Physical Exam  Constitutional: He is oriented to person, place, and time. He appears well-developed and well-nourished.  HENT:  Head: Normocephalic and atraumatic.  Eyes: EOM are normal. Pupils are equal, round, and reactive to light.  Neck: Normal range of motion. Neck supple. No JVD present.  Cardiovascular: Normal rate and regular rhythm.  Exam reveals no gallop and no friction rub.   No murmur heard. Pulmonary/Chest: No respiratory distress. He has no wheezes.  Rhonchi worse in the right lower lung field  Abdominal: He exhibits no distension. There is no tenderness. There is no rebound and no guarding.  Benign abdomen  Musculoskeletal: Normal range of motion.  Neurological: He is alert and oriented to person, place, and time.  Generalized weakness  Skin: No rash noted. No pallor.  Psychiatric: He has a normal mood and affect. His behavior is normal.  Nursing note and vitals reviewed.   ED Course  Procedures (including critical care time) Labs Review Labs Reviewed  COMPREHENSIVE METABOLIC PANEL - Abnormal; Notable for the following:    Glucose, Bld 121 (*)    ALT 11 (*)    Total Bilirubin 1.4 (*)    All other components within normal limits  CBC - Abnormal; Notable for the following:    WBC 16.4 (*)    All other components within normal limits   URINALYSIS, ROUTINE W REFLEX MICROSCOPIC (NOT AT [facility])  I-STAT CG4 LACTIC ACID, ED    Imaging Review Dg Chest 2 View  11/24/2014   CLINICAL DATA:  Productive cough with nausea about a sweats and chills since yesterday  EXAM: CHEST  2 VIEW  COMPARISON:  03/27/2014  FINDINGS: The heart size and mediastinal contours are within normal limits. Both lungs are clear. The visualized skeletal structures are unremarkable.  IMPRESSION: No active cardiopulmonary disease.   Electronically Signed   By: Skipper Cliche M.D.   On: 11/24/2014 13:04   I have personally reviewed and evaluated these images and lab results as part of my medical decision-making.   EKG Interpretation None      MDM   Final diagnoses:  URI (upper respiratory infection)  Non-intractable vomiting with nausea, vomiting of unspecified type    59 yo M with a viral-like syndrome. Patient with right lower lung field rhonchi. Chest x-ray to rule out pneumonia. IV hydration  Chest x-ray negative. Patient feeling mildly better after IV fluids Toradol.  Feel likely URI though possibility of early pneumonia is not ruled out. Patient is not hypoxic blood pressure is normal the young age feel no reason to admit to the hospital. Will sent home with Zofran. NSAIDs oral fluids  1:20 PM:  I have discussed the diagnosis/risks/treatment options with the patient and family and believe the pt to be eligible for discharge home to follow-up with PCP. We also discussed returning to the ED immediately if new or worsening sx occur. We discussed the sx which are most concerning (e.g., inability to eat or drink, sob) that necessitate immediate return. Medications administered to the patient during their visit and any new prescriptions provided to the patient are listed below.  Medications given during this visit Medications  alum & mag hydroxide-simeth (MAALOX/MYLANTA) 200-200-20 MG/5ML suspension 15 mL (not administered)  ondansetron (ZOFRAN-ODT)  disintegrating tablet 4 mg (not administered)  sodium chloride 0.9 % bolus 1,000 mL (1,000 mLs Intravenous New Bag/Given 11/24/14 1259)  ketorolac (TORADOL) 30 MG/ML injection 30 mg (30 mg Intravenous Given 11/24/14 1259)  acetaminophen (TYLENOL) tablet 1,000 mg (1,000 mg Oral Given 11/24/14 1258)    New Prescriptions   ONDANSETRON (ZOFRAN ODT) 4 MG DISINTEGRATING TABLET    Take 1 tablet (4 mg  total) by mouth every 8 (eight) hours as needed for nausea or vomiting.     The patient appears reasonably screen and/or stabilized for discharge and I doubt any other medical condition or other [facility] requiring further screening, evaluation, or treatment in the ED at this time prior to discharge.     Deno Etienne, DO 11/24/14 1321

## 2014-11-24 NOTE — ED Notes (Signed)
He c/o cough with sputum, nausea and vomiting, generalized body aches, sweats and chills since yesterday. family is concerned that he may have bronchitis because this is how he presented last time he had bronchitis

## 2014-11-24 NOTE — ED Notes (Signed)
Pt discharged earlier today for same complaints. Pt continues to have nv unrelieved by prescription zofran. Last dose at [place]. Pt unable to tolerate PO

## 2014-11-24 NOTE — ED Notes (Signed)
Family sts pt never received bag of fluids. Pt appears pale.

## 2014-11-24 NOTE — Discharge Instructions (Signed)
Take 4 over the counter ibuprofen tablets 3 times a day or 2 over-the-counter naproxen tablets twice a day for pain. Take tylenol 1000mg  4 times a day. Follow up with your doctor, return for inability to eat or drink.   Upper Respiratory Infection, Adult An upper respiratory infection (URI) is also known as the common cold. It is often caused by a type of germ (virus). Colds are easily spread (contagious). You can pass it to others by kissing, coughing, sneezing, or drinking out of the same glass. Usually, you get better in 1 or 2 weeks.  HOME CARE   Only take medicine as told by your doctor.  Use a warm mist humidifier or breathe in steam from a hot shower.  Drink enough water and fluids to keep your pee (urine) clear or pale yellow.  Get plenty of rest.  Return to work when your temperature is back to normal or as told by your doctor. You may use a face mask and wash your hands to stop your cold from spreading. GET HELP RIGHT AWAY IF:   After the first few days, you feel you are getting worse.  You have questions about your medicine.  You have chills, shortness of breath, or brown or red spit (mucus).  You have yellow or brown snot (nasal discharge) or pain in the face, especially when you bend forward.  You have a fever, puffy (swollen) neck, pain when you swallow, or white spots in the back of your throat.  You have a bad headache, ear pain, sinus pain, or chest pain.  You have a high-pitched whistling sound when you breathe in and out (wheezing).  You have a lasting cough or cough up blood.  You have sore muscles or a stiff neck. MAKE SURE YOU:   Understand these instructions.  Will watch your condition.  Will get help right away if you are not doing well or get worse. Document Released: 08/12/2007 Document Revised: 05/18/2011 Document Reviewed: 05/31/2013 [facility] Patient Information 2015 [place], [place]. This information is not intended to replace advice given to you  by your health care provider. Make sure you discuss any questions you have with your health care provider.

## 2014-11-25 ENCOUNTER — Emergency Department (HOSPITAL_COMMUNITY): Payer: BLUE CROSS/BLUE SHIELD

## 2014-11-25 ENCOUNTER — Inpatient Hospital Stay (HOSPITAL_COMMUNITY)
Admission: EM | Admit: 2014-11-25 | Discharge: 2014-12-01 | DRG: 326 | Disposition: A | Discharge status: Home / Self Care | Payer: BLUE CROSS/BLUE SHIELD | Attending: General Surgery | Admitting: General Surgery

## 2014-11-25 ENCOUNTER — Emergency Department (HOSPITAL_COMMUNITY): Payer: BLUE CROSS/BLUE SHIELD | Admitting: Anesthesiology

## 2014-11-25 ENCOUNTER — Encounter (HOSPITAL_COMMUNITY): Payer: Self-pay | Admitting: Radiology

## 2014-11-25 ENCOUNTER — Encounter (HOSPITAL_COMMUNITY): Admission: EM | Disposition: A | Discharge status: Home / Self Care | Payer: Self-pay | Source: Home / Self Care

## 2014-11-25 DIAGNOSIS — G8929 Other chronic pain: Secondary | ICD-10-CM | POA: Diagnosis present

## 2014-11-25 DIAGNOSIS — K659 Peritonitis, unspecified: Secondary | ICD-10-CM | POA: Diagnosis not present

## 2014-11-25 DIAGNOSIS — K219 Gastro-esophageal reflux disease without esophagitis: Secondary | ICD-10-CM | POA: Diagnosis present

## 2014-11-25 DIAGNOSIS — I4891 Unspecified atrial fibrillation: Secondary | ICD-10-CM | POA: Diagnosis not present

## 2014-11-25 DIAGNOSIS — K259 Gastric ulcer, unspecified as acute or chronic, without hemorrhage or perforation: Secondary | ICD-10-CM | POA: Diagnosis present

## 2014-11-25 DIAGNOSIS — I10 Essential (primary) hypertension: Secondary | ICD-10-CM | POA: Diagnosis present

## 2014-11-25 DIAGNOSIS — R109 Unspecified abdominal pain: Secondary | ICD-10-CM | POA: Diagnosis present

## 2014-11-25 DIAGNOSIS — Z794 Long term (current) use of insulin: Secondary | ICD-10-CM | POA: Diagnosis not present

## 2014-11-25 DIAGNOSIS — F17210 Nicotine dependence, cigarettes, uncomplicated: Secondary | ICD-10-CM | POA: Diagnosis present

## 2014-11-25 DIAGNOSIS — K255 Chronic or unspecified gastric ulcer with perforation: Secondary | ICD-10-CM | POA: Diagnosis present

## 2014-11-25 DIAGNOSIS — K251 Acute gastric ulcer with perforation: Secondary | ICD-10-CM | POA: Diagnosis not present

## 2014-11-25 DIAGNOSIS — K275 Chronic or unspecified peptic ulcer, site unspecified, with perforation: Secondary | ICD-10-CM | POA: Diagnosis present

## 2014-11-25 DIAGNOSIS — H9190 Unspecified hearing loss, unspecified ear: Secondary | ICD-10-CM | POA: Diagnosis present

## 2014-11-25 DIAGNOSIS — Z79899 Other long term (current) drug therapy: Secondary | ICD-10-CM | POA: Diagnosis not present

## 2014-11-25 DIAGNOSIS — E1151 Type 2 diabetes mellitus with diabetic peripheral angiopathy without gangrene: Secondary | ICD-10-CM | POA: Diagnosis not present

## 2014-11-25 DIAGNOSIS — N179 Acute kidney failure, unspecified: Secondary | ICD-10-CM | POA: Diagnosis not present

## 2014-11-25 DIAGNOSIS — Z882 Allergy status to sulfonamides status: Secondary | ICD-10-CM | POA: Diagnosis not present

## 2014-11-25 DIAGNOSIS — E11319 Type 2 diabetes mellitus with unspecified diabetic retinopathy without macular edema: Secondary | ICD-10-CM | POA: Diagnosis present

## 2014-11-25 DIAGNOSIS — Z8551 Personal history of malignant neoplasm of bladder: Secondary | ICD-10-CM | POA: Diagnosis not present

## 2014-11-25 DIAGNOSIS — Z888 Allergy status to other drugs, medicaments and biological substances status: Secondary | ICD-10-CM | POA: Diagnosis not present

## 2014-11-25 DIAGNOSIS — I480 Paroxysmal atrial fibrillation: Secondary | ICD-10-CM | POA: Diagnosis present

## 2014-11-25 DIAGNOSIS — Z881 Allergy status to other antibiotic agents status: Secondary | ICD-10-CM | POA: Diagnosis not present

## 2014-11-25 DIAGNOSIS — R627 Adult failure to thrive: Secondary | ICD-10-CM | POA: Diagnosis present

## 2014-11-25 DIAGNOSIS — J449 Chronic obstructive pulmonary disease, unspecified: Secondary | ICD-10-CM | POA: Diagnosis present

## 2014-11-25 DIAGNOSIS — M79669 Pain in unspecified lower leg: Secondary | ICD-10-CM | POA: Diagnosis present

## 2014-11-25 DIAGNOSIS — K668 Other specified disorders of peritoneum: Secondary | ICD-10-CM | POA: Diagnosis present

## 2014-11-25 DIAGNOSIS — M1990 Unspecified osteoarthritis, unspecified site: Secondary | ICD-10-CM | POA: Diagnosis present

## 2014-11-25 HISTORY — DX: Peripheral vascular disease, unspecified: I73.9

## 2014-11-25 HISTORY — PX: LAPAROSCOPY: SHX197

## 2014-11-25 HISTORY — DX: Type 1 diabetes mellitus without complications: E10.9

## 2014-11-25 LAB — I-STAT CHEM 8, ED
BUN: 30 mg/dL — AB (ref 6–20)
CREATININE: 1.7 mg/dL — AB (ref 0.61–1.24)
Calcium, Ion: 1.13 mmol/L (ref 1.12–1.23)
Chloride: 102 mmol/L (ref 101–111)
GLUCOSE: 192 mg/dL — AB (ref 65–99)
HCT: 45 % (ref 39.0–52.0)
HEMOGLOBIN: 15.3 g/dL (ref 13.0–17.0)
POTASSIUM: 4.9 mmol/L (ref 3.5–5.1)
Sodium: 138 mmol/L (ref 135–145)
TCO2: 24 mmol/L (ref 0–100)

## 2014-11-25 LAB — GLUCOSE, CAPILLARY
GLUCOSE-CAPILLARY: 178 mg/dL — AB (ref 65–99)
GLUCOSE-CAPILLARY: 204 mg/dL — AB (ref 65–99)
GLUCOSE-CAPILLARY: 144 mg/dL — AB (ref 65–99)
GLUCOSE-CAPILLARY: 107 mg/dL — AB (ref 65–99)

## 2014-11-25 LAB — CBC WITH DIFFERENTIAL/PLATELET
BASOS ABS: 0 10*3/uL (ref 0.0–0.1)
Basophils Relative: 0 %
Eosinophils Absolute: 0 10*3/uL (ref 0.0–0.7)
Eosinophils Relative: 0 %
HEMATOCRIT: 41.9 % (ref 39.0–52.0)
HEMOGLOBIN: 14.6 g/dL (ref 13.0–17.0)
LYMPHS PCT: 3 %
Lymphs Abs: 0.4 10*3/uL — ABNORMAL LOW (ref 0.7–4.0)
MCH: 32.2 pg (ref 26.0–34.0)
MCHC: 34.8 g/dL (ref 30.0–36.0)
MCV: 92.3 fL (ref 78.0–100.0)
MONO ABS: 0.9 10*3/uL (ref 0.1–1.0)
MONOS PCT: 8 %
NEUTROS ABS: 10.9 10*3/uL — AB (ref 1.7–7.7)
Neutrophils Relative %: 89 %
Platelets: 218 10*3/uL (ref 150–400)
RBC: 4.54 MIL/uL (ref 4.22–5.81)
RDW: 12.8 % (ref 11.5–15.5)
WBC: 12.2 10*3/uL — ABNORMAL HIGH (ref 4.0–10.5)

## 2014-11-25 LAB — URINALYSIS, ROUTINE W REFLEX MICROSCOPIC
Glucose, UA: 100 mg/dL — AB
HGB URINE DIPSTICK: NEGATIVE
Ketones, ur: 15 mg/dL — AB
LEUKOCYTES UA: NEGATIVE
NITRITE: NEGATIVE
PROTEIN: 30 mg/dL — AB
SPECIFIC GRAVITY, URINE: 1.02 (ref 1.005–1.030)
UROBILINOGEN UA: 1 mg/dL (ref 0.0–1.0)
pH: 5.5 (ref 5.0–8.0)

## 2014-11-25 LAB — I-STAT TROPONIN, ED: Troponin i, poc: 0.04 ng/mL (ref 0.00–0.08)

## 2014-11-25 LAB — BASIC METABOLIC PANEL
ANION GAP: 5 (ref 5–15)
BUN: 19 mg/dL (ref 6–20)
CHLORIDE: 110 mmol/L (ref 101–111)
CO2: 24 mmol/L (ref 22–32)
Calcium: 7.8 mg/dL — ABNORMAL LOW (ref 8.9–10.3)
Creatinine, Ser: 1.05 mg/dL (ref 0.61–1.24)
GFR calc non Af Amer: >60 mL/min (ref >60)
Glucose, Bld: 183 mg/dL — ABNORMAL HIGH (ref 65–99)
POTASSIUM: 4.1 mmol/L (ref 3.5–5.1)
SODIUM: 139 mmol/L (ref 135–145)

## 2014-11-25 LAB — URINE MICROSCOPIC-ADD ON

## 2014-11-25 LAB — MRSA PCR SCREENING: MRSA by PCR: NEGATIVE

## 2014-11-25 LAB — LIPASE, BLOOD: LIPASE: 15 U/L — AB (ref 22–51)

## 2014-11-25 SURGERY — LAPAROSCOPY, DIAGNOSTIC
Anesthesia: General | Site: Abdomen

## 2014-11-25 MED ORDER — HYDROMORPHONE HCL 1 MG/ML IJ SOLN
0.2500 mg | INTRAMUSCULAR | Status: DC | PRN
  Administered 2014-11-25 (×2): 0.5 mg via INTRAVENOUS

## 2014-11-25 MED ORDER — SODIUM CHLORIDE 0.9 % IJ SOLN: 9.0000 mL | INTRAMUSCULAR | Status: DC | PRN

## 2014-11-25 MED ORDER — MOMETASONE FURO-FORMOTEROL FUM 100-5 MCG/ACT IN AERO
2.0000 | INHALATION_SPRAY | Freq: Two times a day (BID) | RESPIRATORY_TRACT | Status: DC
  Administered 2014-11-25 – 2014-12-01 (×9): 2 via RESPIRATORY_TRACT
  Filled 2014-11-25 (×3): qty 8.8

## 2014-11-25 MED ORDER — ONDANSETRON HCL 4 MG/2ML IJ SOLN
4.0000 mg | Freq: Four times a day (QID) | INTRAMUSCULAR | Status: DC | PRN
  Administered 2014-11-28: 4 mg via INTRAVENOUS
  Filled 2014-11-25: qty 2

## 2014-11-25 MED ORDER — HEPARIN SODIUM (PORCINE) 5000 UNIT/ML IJ SOLN
5000.0000 [IU] | Freq: Three times a day (TID) | INTRAMUSCULAR | Status: DC
  Administered 2014-11-26 – 2014-11-30 (×14): 5000 [IU] via SUBCUTANEOUS
  Filled 2014-11-25 (×17): qty 1

## 2014-11-25 MED ORDER — ONDANSETRON HCL 4 MG/2ML IJ SOLN
INTRAMUSCULAR | Status: AC
  Filled 2014-11-25: qty 2

## 2014-11-25 MED ORDER — FENTANYL CITRATE (PF) 100 MCG/2ML IJ SOLN
100.0000 ug | Freq: Once | INTRAMUSCULAR | Status: AC
  Administered 2014-11-25: 100 ug via INTRAVENOUS
  Filled 2014-11-25: qty 2

## 2014-11-25 MED ORDER — SODIUM CHLORIDE 0.9 % IV BOLUS (SEPSIS)
1000.0000 mL | Freq: Once | INTRAVENOUS | Status: AC
  Administered 2014-11-25: 1000 mL via INTRAVENOUS

## 2014-11-25 MED ORDER — PROMETHAZINE HCL 25 MG/ML IJ SOLN: 6.2500 mg | INTRAMUSCULAR | Status: DC | PRN

## 2014-11-25 MED ORDER — DIPHENHYDRAMINE HCL 50 MG/ML IJ SOLN: 12.5000 mg | Freq: Four times a day (QID) | INTRAMUSCULAR | Status: DC | PRN

## 2014-11-25 MED ORDER — FENTANYL CITRATE (PF) 100 MCG/2ML IJ SOLN: 50.0000 ug | Freq: Once | INTRAMUSCULAR | Status: DC

## 2014-11-25 MED ORDER — HYDROMORPHONE HCL 1 MG/ML IJ SOLN
INTRAMUSCULAR | Status: AC
  Filled 2014-11-25: qty 1

## 2014-11-25 MED ORDER — SUGAMMADEX SODIUM 200 MG/2ML IV SOLN
INTRAVENOUS | Status: DC | PRN
  Administered 2014-11-25: 200 mg via INTRAVENOUS

## 2014-11-25 MED ORDER — PANTOPRAZOLE SODIUM 40 MG IV SOLR
40.0000 mg | Freq: Two times a day (BID) | INTRAVENOUS | Status: DC
Start: 2014-11-25 — End: 2014-12-01
  Administered 2014-11-25 – 2014-11-30 (×12): 40 mg via INTRAVENOUS
  Filled 2014-11-25 (×13): qty 40

## 2014-11-25 MED ORDER — FENTANYL CITRATE (PF) 250 MCG/5ML IJ SOLN
INTRAMUSCULAR | Status: DC | PRN
  Administered 2014-11-25: 150 ug via INTRAVENOUS
  Administered 2014-11-25 (×2): 50 ug via INTRAVENOUS

## 2014-11-25 MED ORDER — DIPHENHYDRAMINE HCL 12.5 MG/5ML PO ELIX
12.5000 mg | ORAL_SOLUTION | Freq: Four times a day (QID) | ORAL | Status: DC | PRN
  Filled 2014-11-25: qty 5

## 2014-11-25 MED ORDER — ROCURONIUM BROMIDE 50 MG/5ML IV SOLN
INTRAVENOUS | Status: AC
  Filled 2014-11-25: qty 1

## 2014-11-25 MED ORDER — PHENYLEPHRINE HCL 10 MG/ML IJ SOLN
10.0000 mg | INTRAVENOUS | Status: DC | PRN
  Administered 2014-11-25: 20 ug/min via INTRAVENOUS

## 2014-11-25 MED ORDER — MIDAZOLAM HCL 2 MG/2ML IJ SOLN
INTRAMUSCULAR | Status: AC
  Filled 2014-11-25: qty 4

## 2014-11-25 MED ORDER — CETYLPYRIDINIUM CHLORIDE 0.05 % MT LIQD
7.0000 mL | Freq: Two times a day (BID) | OROMUCOSAL | Status: DC
  Administered 2014-11-25 – 2014-11-30 (×11): 7 mL via OROMUCOSAL

## 2014-11-25 MED ORDER — LIDOCAINE HCL (CARDIAC) 20 MG/ML IV SOLN
INTRAVENOUS | Status: DC | PRN
  Administered 2014-11-25: 4 mL via INTRAVENOUS

## 2014-11-25 MED ORDER — PIPERACILLIN-TAZOBACTAM 3.375 G IVPB 30 MIN
3.3750 g | Freq: Once | INTRAVENOUS | Status: AC
  Administered 2014-11-25: 3.375 g via INTRAVENOUS
  Filled 2014-11-25: qty 50

## 2014-11-25 MED ORDER — SODIUM CHLORIDE 0.9 % IV SOLN
INTRAVENOUS | Status: DC | PRN
  Administered 2014-11-25 (×2): via INTRAVENOUS

## 2014-11-25 MED ORDER — EVICEL 5 ML EX KIT: PACK | CUTANEOUS | Status: DC | PRN

## 2014-11-25 MED ORDER — POTASSIUM CHLORIDE IN NACL 20-0.45 MEQ/L-% IV SOLN
INTRAVENOUS | Status: DC
  Administered 2014-11-25 – 2014-11-28 (×6): via INTRAVENOUS
  Administered 2014-11-29: 1000 mL via INTRAVENOUS
  Administered 2014-11-29 – 2014-11-30 (×3): via INTRAVENOUS
  Filled 2014-11-25 (×21): qty 1000

## 2014-11-25 MED ORDER — EVICEL 5 ML EX KIT
PACK | CUTANEOUS | Status: AC
  Filled 2014-11-25: qty 1

## 2014-11-25 MED ORDER — PROPOFOL 10 MG/ML IV BOLUS
INTRAVENOUS | Status: AC
  Filled 2014-11-25: qty 20

## 2014-11-25 MED ORDER — ROCURONIUM BROMIDE 100 MG/10ML IV SOLN
INTRAVENOUS | Status: DC | PRN
  Administered 2014-11-25: 20 mg via INTRAVENOUS
  Administered 2014-11-25: 30 mg via INTRAVENOUS

## 2014-11-25 MED ORDER — FENTANYL CITRATE (PF) 100 MCG/2ML IJ SOLN: 25.0000 ug | INTRAMUSCULAR | Status: DC | PRN

## 2014-11-25 MED ORDER — LACTATED RINGERS IV SOLN
INTRAVENOUS | Status: DC | PRN
  Administered 2014-11-25 (×2): via INTRAVENOUS

## 2014-11-25 MED ORDER — BUPIVACAINE-EPINEPHRINE (PF) 0.25% -1:200000 IJ SOLN
INTRAMUSCULAR | Status: AC
  Filled 2014-11-25: qty 30

## 2014-11-25 MED ORDER — INSULIN ASPART 100 UNIT/ML ~~LOC~~ SOLN
0.0000 [IU] | SUBCUTANEOUS | Status: DC
  Administered 2014-11-25: 3 [IU] via SUBCUTANEOUS
  Administered 2014-11-25: 7 [IU] via SUBCUTANEOUS
  Administered 2014-11-26 – 2014-11-27 (×6): 3 [IU] via SUBCUTANEOUS
  Administered 2014-11-27 – 2014-11-28 (×4): 4 [IU] via SUBCUTANEOUS
  Administered 2014-11-28: 3 [IU] via SUBCUTANEOUS
  Administered 2014-11-28: 4 [IU] via SUBCUTANEOUS
  Administered 2014-11-28: 3 [IU] via SUBCUTANEOUS
  Administered 2014-11-28: 4 [IU] via SUBCUTANEOUS
  Administered 2014-11-28: 3 [IU] via SUBCUTANEOUS
  Administered 2014-11-29: 11 [IU] via SUBCUTANEOUS
  Administered 2014-11-29: 4 [IU] via SUBCUTANEOUS
  Administered 2014-11-29: 3 [IU] via SUBCUTANEOUS
  Administered 2014-11-29 – 2014-11-30 (×4): 4 [IU] via SUBCUTANEOUS
  Administered 2014-11-30: 7 [IU] via SUBCUTANEOUS

## 2014-11-25 MED ORDER — ONDANSETRON HCL 4 MG/2ML IJ SOLN
INTRAMUSCULAR | Status: DC | PRN
  Administered 2014-11-25: 4 mg via INTRAVENOUS

## 2014-11-25 MED ORDER — MIDAZOLAM HCL 2 MG/2ML IJ SOLN
INTRAMUSCULAR | Status: DC | PRN
  Administered 2014-11-25 (×2): 1 mg via INTRAVENOUS

## 2014-11-25 MED ORDER — NALOXONE HCL 0.4 MG/ML IJ SOLN: 0.4000 mg | INTRAMUSCULAR | Status: DC | PRN

## 2014-11-25 MED ORDER — 0.9 % SODIUM CHLORIDE (POUR BTL) OPTIME
TOPICAL | Status: DC | PRN
  Administered 2014-11-25: 2000 mL

## 2014-11-25 MED ORDER — ONDANSETRON 4 MG PO TBDP
4.0000 mg | ORAL_TABLET | Freq: Four times a day (QID) | ORAL | Status: DC | PRN
  Filled 2014-11-25: qty 1

## 2014-11-25 MED ORDER — EPHEDRINE SULFATE 50 MG/ML IJ SOLN
INTRAMUSCULAR | Status: AC
  Filled 2014-11-25: qty 1

## 2014-11-25 MED ORDER — EPHEDRINE SULFATE 50 MG/ML IJ SOLN
INTRAMUSCULAR | Status: DC | PRN
  Administered 2014-11-25: 15 mg via INTRAVENOUS

## 2014-11-25 MED ORDER — MORPHINE SULFATE 1 MG/ML IV SOLN
INTRAVENOUS | Status: DC
  Administered 2014-11-25: 10:00:00 via INTRAVENOUS
  Administered 2014-11-26: 1 mg via INTRAVENOUS
  Administered 2014-11-26: 0 mg via INTRAVENOUS
  Administered 2014-11-26: 3 mg via INTRAVENOUS
  Administered 2014-11-27 (×2): 1 mg via INTRAVENOUS
  Administered 2014-11-27: 3 mg via INTRAVENOUS
  Administered 2014-11-27: 2 mg via INTRAVENOUS
  Administered 2014-11-28: 0 mg via INTRAVENOUS
  Administered 2014-11-28: 1 mg via INTRAVENOUS
  Administered 2014-11-28: 3 mg via INTRAVENOUS
  Administered 2014-11-28: 1 mg via INTRAVENOUS

## 2014-11-25 MED ORDER — VANCOMYCIN HCL IN DEXTROSE 1-5 GM/200ML-% IV SOLN
1000.0000 mg | Freq: Once | INTRAVENOUS | Status: AC
  Administered 2014-11-25: 1000 mg via INTRAVENOUS
  Filled 2014-11-25: qty 200

## 2014-11-25 MED ORDER — SODIUM CHLORIDE 0.9 % IR SOLN
Status: DC | PRN
  Administered 2014-11-25 (×2): 3000 mL
  Administered 2014-11-25: 1000 mL

## 2014-11-25 MED ORDER — PIPERACILLIN-TAZOBACTAM 3.375 G IVPB
3.3750 g | Freq: Three times a day (TID) | INTRAVENOUS | Status: DC
  Administered 2014-11-25 – 2014-12-01 (×18): 3.375 g via INTRAVENOUS
  Filled 2014-11-25 (×20): qty 50

## 2014-11-25 MED ORDER — FENTANYL CITRATE (PF) 250 MCG/5ML IJ SOLN
INTRAMUSCULAR | Status: AC
  Filled 2014-11-25: qty 5

## 2014-11-25 MED ORDER — VANCOMYCIN HCL IN DEXTROSE 750-5 MG/150ML-% IV SOLN
750.0000 mg | Freq: Two times a day (BID) | INTRAVENOUS | Status: DC
  Administered 2014-11-25 – 2014-11-27 (×4): 750 mg via INTRAVENOUS
  Filled 2014-11-25 (×6): qty 150

## 2014-11-25 MED ORDER — ALBUTEROL SULFATE (2.5 MG/3ML) 0.083% IN NEBU: 2.5000 mg | INHALATION_SOLUTION | Freq: Four times a day (QID) | RESPIRATORY_TRACT | Status: DC | PRN

## 2014-11-25 MED ORDER — SUCCINYLCHOLINE CHLORIDE 20 MG/ML IJ SOLN
INTRAMUSCULAR | Status: DC | PRN
  Administered 2014-11-25: 120 mg via INTRAVENOUS

## 2014-11-25 MED ORDER — PROPOFOL 10 MG/ML IV BOLUS
INTRAVENOUS | Status: DC | PRN
  Administered 2014-11-25: 30 mg via INTRAVENOUS
  Administered 2014-11-25: 150 mg via INTRAVENOUS

## 2014-11-25 MED ORDER — IOHEXOL 300 MG/ML  SOLN
100.0000 mL | Freq: Once | INTRAMUSCULAR | Status: AC | PRN
  Administered 2014-11-25: 100 mL via INTRAVENOUS

## 2014-11-25 MED ORDER — MORPHINE SULFATE 1 MG/ML IV SOLN
INTRAVENOUS | Status: AC
  Filled 2014-11-25: qty 25

## 2014-11-25 MED ORDER — ONDANSETRON HCL 4 MG/2ML IJ SOLN
4.0000 mg | Freq: Once | INTRAMUSCULAR | Status: AC
  Administered 2014-11-25: 4 mg via INTRAVENOUS
  Filled 2014-11-25: qty 2

## 2014-11-25 MED ORDER — SUGAMMADEX SODIUM 200 MG/2ML IV SOLN
INTRAVENOUS | Status: AC
  Filled 2014-11-25: qty 2

## 2014-11-25 SURGICAL SUPPLY — 71 items
APPLICATOR COTTON TIP 6IN STRL (MISCELLANEOUS) ×3 IMPLANT
BLADE SURG ROTATE 9660 (MISCELLANEOUS) IMPLANT
CANISTER SUCTION 2500CC (MISCELLANEOUS) ×4 IMPLANT
CHLORAPREP W/TINT 26ML (MISCELLANEOUS) ×4 IMPLANT
COVER MAYO STAND STRL (DRAPES) ×3 IMPLANT
COVER SURGICAL LIGHT HANDLE (MISCELLANEOUS) ×4 IMPLANT
DECANTER SPIKE VIAL GLASS SM (MISCELLANEOUS) ×8 IMPLANT
DRAIN CHANNEL 19F RND (DRAIN) ×3 IMPLANT
DRAPE LAPAROSCOPIC ABDOMINAL (DRAPES) ×4 IMPLANT
DRAPE PROXIMA HALF (DRAPES) IMPLANT
DRAPE UTILITY XL STRL (DRAPES) ×8 IMPLANT
DRAPE WARM FLUID 44X44 (DRAPE) ×4 IMPLANT
DRSG OPSITE POSTOP 4X10 (GAUZE/BANDAGES/DRESSINGS) IMPLANT
DRSG OPSITE POSTOP 4X8 (GAUZE/BANDAGES/DRESSINGS) IMPLANT
ELECT BLADE 6.5 EXT (BLADE) IMPLANT
ELECT CAUTERY BLADE 6.4 (BLADE) ×5 IMPLANT
ELECT REM PT RETURN 9FT ADLT (ELECTROSURGICAL) ×4
ELECTRODE REM PT RTRN 9FT ADLT (ELECTROSURGICAL) ×2 IMPLANT
EVACUATOR SILICONE 100CC (DRAIN) ×3 IMPLANT
GAUZE SPONGE 4X4 12PLY STRL (GAUZE/BANDAGES/DRESSINGS) ×4 IMPLANT
GLOVE BIO SURGEON STRL SZ7.5 (GLOVE) ×4 IMPLANT
GLOVE BIOGEL M STRL SZ7.5 (GLOVE) ×8 IMPLANT
GLOVE BIOGEL PI IND STRL 8 (GLOVE) ×2 IMPLANT
GLOVE BIOGEL PI INDICATOR 8 (GLOVE) ×2
GLOVE SURG SIGNA 7.5 PF LTX (GLOVE) ×6 IMPLANT
GOWN STRL REUS W/ TWL LRG LVL3 (GOWN DISPOSABLE) ×6 IMPLANT
GOWN STRL REUS W/ TWL XL LVL3 (GOWN DISPOSABLE) ×2 IMPLANT
GOWN STRL REUS W/TWL LRG LVL3 (GOWN DISPOSABLE) ×12
GOWN STRL REUS W/TWL XL LVL3 (GOWN DISPOSABLE) ×4
KIT BASIN OR (CUSTOM PROCEDURE TRAY) ×4 IMPLANT
KIT ROOM TURNOVER OR (KITS) ×4 IMPLANT
LIGASURE IMPACT 36 18CM CVD LR (INSTRUMENTS) ×1 IMPLANT
LIQUID BAND (GAUZE/BANDAGES/DRESSINGS) ×4 IMPLANT
MARKER SKIN DUAL TIP RULER LAB (MISCELLANEOUS) ×3 IMPLANT
NS IRRIG 1000ML POUR BTL (IV SOLUTION) ×8 IMPLANT
PACK GENERAL/GYN (CUSTOM PROCEDURE TRAY) ×4 IMPLANT
PAD ARMBOARD 7.5X6 YLW CONV (MISCELLANEOUS) ×8 IMPLANT
PENCIL BUTTON HOLSTER BLD 10FT (ELECTRODE) IMPLANT
SCALPEL HARMONIC ACE (MISCELLANEOUS) IMPLANT
SCISSORS LAP 5X35 DISP (ENDOMECHANICALS) IMPLANT
SET IRRIG TUBING LAPAROSCOPIC (IRRIGATION / IRRIGATOR) ×3 IMPLANT
SLEEVE ENDOPATH XCEL 5M (ENDOMECHANICALS) ×7 IMPLANT
SPECIMEN JAR LARGE (MISCELLANEOUS) ×1 IMPLANT
SPONGE LAP 18X18 X RAY DECT (DISPOSABLE) IMPLANT
STAPLER VISISTAT 35W (STAPLE) ×1 IMPLANT
SUCTION POOLE TIP (SUCTIONS) ×4 IMPLANT
SUT ETHILON 2 0 FS 18 (SUTURE) ×3 IMPLANT
SUT MNCRL AB 3-0 PS2 18 (SUTURE) ×3 IMPLANT
SUT MNCRL AB 4-0 PS2 18 (SUTURE) ×7 IMPLANT
SUT PDS AB 1 TP1 96 (SUTURE) ×2 IMPLANT
SUT SILK 2 0 SH CR/8 (SUTURE) ×1 IMPLANT
SUT SILK 2 0 TIES 10X30 (SUTURE) ×1 IMPLANT
SUT SILK 3 0 SH CR/8 (SUTURE) ×4 IMPLANT
SUT SILK 3 0 TIES 10X30 (SUTURE) ×1 IMPLANT
SUT VIC AB 3-0 SH 18 (SUTURE) IMPLANT
SUT VICRYL 0 UR6 27IN ABS (SUTURE) ×3 IMPLANT
TOWEL OR 17X24 6PK STRL BLUE (TOWEL DISPOSABLE) ×4 IMPLANT
TOWEL OR 17X26 10 PK STRL BLUE (TOWEL DISPOSABLE) ×4 IMPLANT
TRAY FOLEY CATH 14FRSI W/METER (CATHETERS) ×4 IMPLANT
TRAY FOLEY CATH 16FRSI W/METER (SET/KITS/TRAYS/PACK) IMPLANT
TRAY LAPAROSCOPIC MC (CUSTOM PROCEDURE TRAY) ×4 IMPLANT
TROCAR BLADELESS 11MM (ENDOMECHANICALS) ×3 IMPLANT
TROCAR BLADELESS 5MM (ENDOMECHANICALS) ×6 IMPLANT
TROCAR XCEL BLUNT TIP 100MML (ENDOMECHANICALS) IMPLANT
TROCAR XCEL NON-BLD 11X100MML (ENDOMECHANICALS) IMPLANT
TROCAR XCEL NON-BLD 5MMX100MML (ENDOMECHANICALS) ×4 IMPLANT
TUBE CONNECTING 12'X1/4 (SUCTIONS)
TUBE CONNECTING 12X1/4 (SUCTIONS) IMPLANT
TUBING INSUFFLATION (TUBING) ×4 IMPLANT
WATER STERILE IRR 1000ML POUR (IV SOLUTION) IMPLANT
YANKAUER SUCT BULB TIP NO VENT (SUCTIONS) ×1 IMPLANT

## 2014-11-25 NOTE — Brief Op Note (Signed)
11/25/2014  9:42 AM  PATIENT:  Thomas Sandoval.  60 y.o. male  PRE-OPERATIVE DIAGNOSIS:  free air, peritonitis  POST-OPERATIVE DIAGNOSIS:  free air, perforated prepyloric ulcer  PROCEDURE:  Procedure(s): LAPAROSCOPIC PRIMARY REPAIR OF PERFORATED PREPYLORIC ULCER WITH GRAHAM PATCH (N/A)  SURGEON:  Surgeon(s) and Role:    * Greer Pickerel, MD - Primary    * Alphonsa Overall, MD - Assisting  PHYSICIAN ASSISTANT:   ASSISTANTS: see above   ANESTHESIA:   local and general  EBL:  Total I/O In: 3400 [I.V.:3400] Out: 285 [Urine:275; Blood:10]  BLOOD ADMINISTERED:none  DRAINS: (78fr) Jackson-Pratt drain(s) with closed bulb suction in the RUQ/over graham patch   LOCAL MEDICATIONS USED:  MARCAINE     SPECIMEN:  No Specimen  DISPOSITION OF SPECIMEN:  N/A  COUNTS:  YES  TOURNIQUET:  * No tourniquets in log *  DICTATION: .Other Dictation: Dictation Number [id number]  PLAN OF CARE: Admit to inpatient   PATIENT DISPOSITION:  PACU - hemodynamically stable.   Delay start of Pharmacological VTE agent (>24hrs) due to surgical blood loss or risk of bleeding: no

## 2014-11-25 NOTE — Op Note (Signed)
NAMEEDRIAN, MELUCCI             ACCOUNT NO.:  [id number]  MEDICAL RECORD NO.:  [id number]  LOCATION:  [facility]                         FACILITY:  [facility]  PHYSICIAN:  Leighton Ruff. Redmond Pulling, MD, FACSDATE OF BIRTH:  November 04, 1955  DATE OF PROCEDURE:  11/25/2014 DATE OF DISCHARGE:                              OPERATIVE REPORT   PREOPERATIVE DIAGNOSIS:  Free air peritonitis.  POSTOPERATIVE DIAGNOSES:  Free air, peritonitis, perforated pre-pyloric ulcer.  PROCEDURE:  Laparoscopic primary repair of perforated pre-pyloric ulcer with Phillip Heal patch.  SURGEON:  Leighton Ruff. Wilson M.D.  ASSISTANT SURGEON:  Alphonsa Overall. MD  ANESTHESIA:  General.  ESTIMATED BLOOD LOSS:  Minimal.  INDICATIONS FOR PROCEDURE:  The patient is a 59 year old, Caucasian male with COPD, Significant tobacco use, chronic back pain.  He takes multiple rounds of NSAIDS throughout the day.  He presented to the ER yesterday complaining of failure to thrive, cough, nausea, and vomiting. He was diagnosed with a viral syndrome and discharged.  At that time, he had no abdominal pain on exam.  He returned early this morning complaining of ongoing nausea, vomiting and now severe abdominal pain. The ER obtained a CT scan, which demonstrated pneumoperitoneum.  On exam, the patient appeared very ill, his story and exam were suggestive of a perforated gastric or duodenal ulcer.  I had a prolonged conversation with the patient and his wife regarding the risks and benefits of surgery including, but not limited to, bleeding, infection, injury to surrounding structures, need for possible resection, leak from repair, blood clot formation, hernia formation, perioperative cardiac and pulmonary events, prolonged hospitalization, ileus, fistula, renal failure, septic shock, multiple procedures and potential death.  He elected to proceed to surgery.  DESCRIPTION OF PROCEDURE:  After obtaining informed consent, he was taken urgently to the operating  room #2 at [facility], placed supine on the operating room table.  General endotracheal anesthesia was established.  Sequential compression devices were placed.  A Foley catheter was placed.  His abdomen was prepped and draped in usual standard surgical fashion.  Surgical time-out was performed.  He had received vanc and Zosyn in the emergency room.  A small infraumbilical incision was made with the 15.  The fascia was incised and the abdominal cavity is entered.  A pursestring suture was placed around the fascial edges and a 12 mm Hasson trocar was placed and pneumoperitoneum was smoothly established up to a patient pressure of 15 mmHg.  Laparoscope was advanced and the abdominal cavity was surveilled. There was thick green liquid in the right upper quadrant around the Liver and in the left upper quadrant around the spleen and diaphragm.  There appeared to be some staining underneath the liver and the distal stomach in the pre-pyloric region.  I placed 2 additional 5-mm trocars in the right abdomen under direct visualization and one 5 mm trocar in the left abdomen.  All under direct visualization.  I evacuated as much of the contaminated fluid as possible with the suction irrigator catheter.  I then gently lifted up to expose segment for the liver and I was able to visualize a perforation in the pre-pyloric region of the stomach.  The edges of the perforation  appeared viable.  The perforation was approximately 1.5 cm in length.  Using 3-0 silk sutures, I placed a suture at the superior aspect of the perforation was able to reapproximate and placed an interrupted stitch at the apex of the ulcer and tied that down and close it.  Also placed another stitch at the inferior aspect of the ulcer and closed it with interrupted 3-0 silk suture laparoscopically.  At this point, Dr. Lucia Gaskins joined me in the operating room.  He assisted in lifting up the liver to further expose the area.  I  then placed 3 additional interrupted 3-0 silk sutures to close the ulcer.  These were full-thickness bites from 1 edge to the other edge to close the defect primarily.  A total of 5 sutures were used.  Two sutures were left long with their needle in tack to serve as ways to fix the omentum in the area.  I then brought a tongue of omentum up and placed it over the repair then using 1 of the sutures that had been left in incorporated some of the omentum with a suture and then tied it down previously to the remaining suture tail.  I then took the other suture and passed it through the omentum at the inferior aspect of the repair and tied it down.  Thus, there were 2 sutures securing the omentum over the repair.  These 2 needles were removed from the abdominal cavity.  I then irrigated the pelvis, irrigated the left upper quadrant, right upper quadrant in.  A total of 5 L of irrigation were used.  I then placed a 19 round drain through the Hasson trocar and brought it out to the most lateral right-sided abdominal wall trocar and secured it to the skin with a 2-0 nylon.  The drain was placed in the right pericolic gutter underneath the liver extending over the [facility] patch.  I then removed the Hasson trocar.  I tied down the previously placed pursestring suture.  There was nothing trapped within my closure was viewed laparoscopically.  There was no air leak. The remaining pneumoperitoneum was released.  The remaining 5 mm trocars were removed.  Skin incisions were closed with a 4-0 Monocryl in a subcuticular fashion followed by the application of benzoin.  Steri- Strips and sterile bandages.  The patient was extubated and taken to the recovery room in stable condition.  All needle, instrument, sponge counts were correct x2.  There were no immediate complications.     Leighton Ruff. Redmond Pulling, MD, FACS     EMW/MEDQ  D:  11/25/2014  T:  11/25/2014  Job:  [id number]

## 2014-11-25 NOTE — H&P (Signed)
Thomas Sandoval. is an 59 y.o. male.   Chief Complaint: abdominal pain HPI: [name] with DM2, COPD, DDD, tobacco abuse came back to the ED this evening for persistent n/v/inability to take PO/and now severe abdominal pain. Pt was in ED early Sat AM for productive, n/v, weakness. States he really didn't have any abdominal pain at that time. His symptoms began on Friday. Thought he was getting bronchitis again which he has had in past. Labs and CXR done which showed an elevated WBC but no PNA. Dx with viral syndrome and sent home. Came back for persistent n/v and now severe abd pain. No prior episodes like this. Subjective f/c. No hematemesis. No dysuria/melena/hematochezia. Smokes several packs of cigarettes a day. Take 3-4 motrin three times a day for "degenerative" back disease. Denies etoh/drugs.   Will get calf pain after walking several hundred yards, takes pletal  Past Medical History  Diagnosis Date  . GERD (gastroesophageal reflux disease)   . Type 2 diabetes mellitus   . History of hemolytic anemia     02/2011  secondary to avelox medication  . History of atrial flutter     EPISODE 02/2011  CONVERTED TO NSR WITH CARDIZEM  . COPD (chronic obstructive pulmonary disease)   . History of chronic bronchitis   . Diabetic retinopathy of both eyes   . Smokers' cough   . Productive cough   . Arthritis   . DDD (degenerative disc disease)   . HOH (hard of hearing)   . Chronic back pain   . Bladder cancer     Past Surgical History  Procedure Laterality Date  . Esophagogastroduodenoscopy N/A 02/06/2013    Procedure: ESOPHAGOGASTRODUODENOSCOPY (EGD);  Surgeon: Lafayette Dragon, MD;  Location: [facility] ENDOSCOPY;  Service: Endoscopy;  Laterality: N/A;  . Transthoracic echocardiogram  02-17-2011    MODERATE LVH/  EF 65%  . Tonsillectomy  as child  . Tympanic membrane repair  as child  . Transurethral resection of bladder tumor with gyrus (turbt-gyrus) N/A 06/12/2013    Procedure: TRANSURETHRAL  RESECTION OF BLADDER TUMOR WITH GYRUS (TURBT-GYRUS);  Surgeon: Claybon Jabs, MD;  Location: [facility];  Service: Urology;  Laterality: N/A;  . Cystoscopy with ureteroscopy Right 08/14/2013    Procedure: CYSTOSCOPY WITH URETEROSCOPY BLADDER BIOPSY ;  Surgeon: Claybon Jabs, MD;  Location: [facility];  Service: Urology;  Laterality: Right;    Family History  Problem Relation Age of Onset  . Breast cancer Mother   . Cancer Mother     Breast  . Rheumatic fever Father   . Heart disease Father   . Heart attack Father     Massive   . Diabetes Son    Social History:  reports that he has been smoking Cigarettes.  He has a 60 pack-year smoking history. He quit smokeless tobacco use about 36 years ago. He reports that he does not drink alcohol or use illicit drugs.  Allergies:  Allergies  Allergen Reactions  . Zithromax [Azithromycin] Other (See Comments)    "Severe stomach cramps; told to list as an allergy by dr. Years ago"  . Avelox [Moxifloxacin Hcl In Nacl] Other (See Comments)    Hemolysis  In 2012  . Bactrim [Sulfamethoxazole-Trimethoprim] Diarrhea and Nausea And Vomiting     (Not in a hospital admission)  Results for orders placed or performed during the hospital encounter of 11/25/14 (from the past 48 hour(s))  CBG monitoring, ED  Status: Abnormal   Collection Time: 11/24/14 11:01 PM  Result Value Ref Range   Glucose-Capillary 182 (H) 65 - 99 mg/dL  CBC with Differential/Platelet     Status: Abnormal   Collection Time: 11/25/14  2:46 AM  Result Value Ref Range   WBC 12.2 (H) 4.0 - 10.5 K/uL   RBC 4.54 4.22 - 5.81 MIL/uL   Hemoglobin 14.6 13.0 - 17.0 g/dL   HCT 41.9 39.0 - 52.0 %   MCV 92.3 78.0 - 100.0 fL   MCH 32.2 26.0 - 34.0 pg   MCHC 34.8 30.0 - 36.0 g/dL   RDW 12.8 11.5 - 15.5 %   Platelets 218 150 - 400 K/uL   Neutrophils Relative % 89 %   Neutro Abs 10.9 (H) 1.7 - 7.7 K/uL   Lymphocytes Relative 3 %   Lymphs Abs 0.4 (L) 0.7  - 4.0 K/uL   Monocytes Relative 8 %   Monocytes Absolute 0.9 0.1 - 1.0 K/uL   Eosinophils Relative 0 %   Eosinophils Absolute 0.0 0.0 - 0.7 K/uL   Basophils Relative 0 %   Basophils Absolute 0.0 0.0 - 0.1 K/uL  Lipase, blood     Status: Abnormal   Collection Time: 11/25/14  2:46 AM  Result Value Ref Range   Lipase 15 (L) 22 - 51 U/L  I-stat chem 8, ed     Status: Abnormal   Collection Time: 11/25/14  2:55 AM  Result Value Ref Range   Sodium 138 135 - 145 mmol/L   Potassium 4.9 3.5 - 5.1 mmol/L   Chloride 102 101 - 111 mmol/L   BUN 30 (H) 6 - 20 mg/dL   Creatinine, Ser 1.70 (H) 0.61 - 1.24 mg/dL   Glucose, Bld 192 (H) 65 - 99 mg/dL   Calcium, Ion 1.13 1.12 - 1.23 mmol/L   TCO2 24 0 - 100 mmol/L   Hemoglobin 15.3 13.0 - 17.0 g/dL   HCT 45.0 39.0 - 52.0 %  I-stat troponin, ED     Status: None   Collection Time: 11/25/14  3:05 AM  Result Value Ref Range   Troponin i, poc 0.04 0.00 - 0.08 ng/mL   Comment 3            Comment: Due to the release kinetics of cTnI, a negative result within the first hours of the onset of symptoms does not rule out myocardial infarction with certainty. If myocardial infarction is still suspected, repeat the test at appropriate intervals.   Urinalysis, Routine w reflex microscopic (not at [facility])     Status: Abnormal   Collection Time: 11/25/14  3:48 AM  Result Value Ref Range   Color, Urine AMBER (A) YELLOW    Comment: BIOCHEMICALS MAY BE AFFECTED BY COLOR   APPearance CLEAR CLEAR   Specific Gravity, Urine 1.020 1.005 - 1.030    Comment: CORRECTED ON 09/18 AT 0524: PREVIOUSLY REPORTED AS 1.029   pH 5.5 5.0 - 8.0   Glucose, UA 100 (A) NEGATIVE mg/dL   Hgb urine dipstick NEGATIVE NEGATIVE   Bilirubin Urine SMALL (A) NEGATIVE   Ketones, ur 15 (A) NEGATIVE mg/dL   Protein, ur 30 (A) NEGATIVE mg/dL   Urobilinogen, UA 1.0 0.0 - 1.0 mg/dL   Nitrite NEGATIVE NEGATIVE   Leukocytes, UA NEGATIVE NEGATIVE  Urine microscopic-add on     Status: Abnormal    Collection Time: 11/25/14  3:48 AM  Result Value Ref Range   Squamous Epithelial / LPF RARE RARE   Bacteria,  UA RARE RARE   Casts HYALINE CASTS (A) NEGATIVE   Urine-Other MUCOUS PRESENT    Dg Chest 2 View  11/24/2014   CLINICAL DATA:  Productive cough with nausea about a sweats and chills since yesterday  EXAM: CHEST  2 VIEW  COMPARISON:  03/27/2014  FINDINGS: The heart size and mediastinal contours are within normal limits. Both lungs are clear. The visualized skeletal structures are unremarkable.  IMPRESSION: No active cardiopulmonary disease.   Electronically Signed   By: Skipper Cliche M.D.   On: 11/24/2014 13:04   Ct Abdomen Pelvis W Contrast  11/25/2014   CLINICAL DATA:  59 year old male with severe abdominal pain nausea no vomiting. History of bladder cancer and COPD. Questionable intra-abdominal free air was seen on earlier radiograph.  EXAM: CT ABDOMEN AND PELVIS WITH CONTRAST  TECHNIQUE: Multidetector CT imaging of the abdomen and pelvis was performed using the standard protocol following bolus administration of intravenous contrast.  CONTRAST:  184mL OMNIPAQUE IOHEXOL 300 MG/ML  SOLN  COMPARISON:  Radiograph dated 11/25/2014 and CT dated 04/24/2013  FINDINGS: The visualized lung bases are clear. Moderate pneumoperitoneum. Small ascites and diffuse mesenteric stranding.  The liver, gallbladder, pancreas, spleen, and adrenal glands appear unremarkable. There is a small nonobstructing left renal calculus. No hydronephrosis. The right kidney appears unremarkable. The visualized ureters and urinary bladder appear unremarkable. The previously described bladder mass is not visualized on the current study. The prostate gland is grossly unremarkable.  Evaluation of the bowel is very limited in the absence of oral contrast as well as diffuse mesentery stranding and fluid. Moderate dense stool noted throughout the colon compatible with constipation. There is apparent thickening of the wall of the  stomach which may be partly related to underdistention or represent gastritis. Clinical correlation is recommended. There is no evidence of bowel obstruction. Evaluation of the right lower quadrant and appendix is limited due to fluid in the mesentery as well as motion artifact on the coronal images. A small tubular structure extending from the base of the cecum posteriorly likely represents a normal appendix (best seen on coronal images 41-57). A loop of bowel inferior to the cecum likely represents a segment of small bowel. There is apparent diffuse small bowel edema.  There is aortoiliac atherosclerotic disease with atherosclerotic calcification of the mesenteric vessels. There is focal narrowing at the origin of the celiac axis. The origin of the celiac axis, SMA, and IMA however remain patent. No portal venous gas identified. There is no lymphadenopathy.  Small fat containing umbilical hernia. Extensive degenerative changes of the spine. No acute fracture.  IMPRESSION: Pneumoperitoneum of indeterminate etiology but concerning for bowel perforation. Clinical correlation and surgical consult is advised.  Small ascites.  Diffuse mesentery edema  Apparent diffuse thickening and edema of the stomach and small bowel which may be partly related to ascites. Evaluation of the bowel is limited in the absence of oral contrast. No evidence of bowel obstruction.  Critical Value/emergent results were called by telephone at the time of interpretation on 11/25/2014 at 4:19 am to Dr. Veatrice Kells , who verbally acknowledged these results.   Electronically Signed   By: Anner Crete M.D.   On: 11/25/2014 04:36   Dg Abd Acute W/chest  11/25/2014   CLINICAL DATA:  59 year old male with history of diabetes and COPD presenting with intermittent and worsening vomiting abdominal pain.  EXAM: DG ABDOMEN ACUTE W/ 1V CHEST  COMPARISON:  Chest radiograph dated 11/24/2014  FINDINGS: There is moderate stool throughout  the colon. No  evidence of bowel obstruction. Air is noted under the right hemidiaphragm on the left lateral decubitus images with air-fluid level appearance. Although this may represent air within the colon, intraperitoneal air is not excluded. CT is recommended for further evaluation. There is degenerative changes of the spine. No acute fracture. No radiopaque calculi identified.  The lungs are clear. No pleural effusion or pneumothorax. The cardiac silhouette is within normal limits.  IMPRESSION: Air under the right hemidiaphragm concerning for pneumoperitoneum. CT is recommended for further evaluation.  Critical Value/emergent results were called by telephone at the time of interpretation on 11/25/2014 at 3:20 am to Dr. Veatrice Kells , who verbally acknowledged these results.   Electronically Signed   By: Anner Crete M.D.   On: 11/25/2014 03:20    Review of Systems  Constitutional: Positive for chills and malaise/fatigue. Negative for weight loss.  HENT: Positive for hearing loss.   Respiratory: Positive for cough and sputum production.        Has frequent episodes of bronchitis  Cardiovascular: Negative for chest pain, palpitations, leg swelling and PND.  Gastrointestinal: Positive for nausea, vomiting and abdominal pain. Negative for blood in stool and melena.  Genitourinary: Negative for dysuria and hematuria.  Musculoskeletal: Positive for back pain (chronic).  Neurological: Negative for seizures and loss of consciousness.  Psychiatric/Behavioral: Negative for hallucinations.    Blood pressure 124/95, pulse 79, temperature 98.2 F (36.8 C), temperature source Oral, resp. rate 22, SpO2 98 %. Physical Exam  Vitals reviewed. Constitutional: He is oriented to person, place, and time. He appears well-developed and well-nourished. He appears distressed.  Thin male moaning, tachypnea, appears very uncomfortable   HENT:  Head: Normocephalic and atraumatic.  Right Ear: External ear normal.  Left Ear:  External ear normal.  Eyes: Conjunctivae and EOM are normal. No scleral icterus.  Neck: Normal range of motion. Neck supple. No tracheal deviation present. No thyromegaly present.  Cardiovascular: Normal rate and normal heart sounds.   Respiratory: No stridor. No respiratory distress. He has no wheezes.  Some coarse BS b/l.   GI: He exhibits no distension. There is tenderness. There is rigidity, rebound and guarding.  Flat abdomen, diffuse TTP, +guarding, +rebound, more TTP in upper abdomen.   Musculoskeletal: He exhibits no edema or tenderness.  Lymphadenopathy:    He has no cervical adenopathy.  Neurological: He is alert and oriented to person, place, and time. He exhibits normal muscle tone.  Skin: Skin is warm. Bruising (scattered old bruising on LE) noted. No rash noted. He is diaphoretic. No erythema. There is pallor.  Psychiatric: He has a normal mood and affect. His behavior is normal. Judgment and thought content normal.     Assessment/Plan Peritonitis Pneumoperitoneum (likely perforated gastric/duodenal ulcer) IDDM COPD Claudication Tobacco abuse Acute kidney injury GERD DDD of back H/o bladder cancer  Pt has peritonitis. He has free air on CT. Needs emergent exploration to locate and treat source of perforation. History suggests perforated gastric/duodenal ulcer.   I discussed the procedure in detail.  Wife at bedside.   We discussed the risks and benefits of surgery including, but not limited to bleeding, infection (such as wound infection, abdominal abscess), injury to surrounding structures, blood clot formation, urinary retention, incisional hernia, stricture/leak, anesthesia risks, pulmonary & cardiac complications such as pneumonia &/or heart attack, need for additional procedures, ileus, & prolonged hospitalization, death, sepsis.  We discussed the typical postoperative recovery course, including limitations & restrictions postoperatively. I explained that the  likelihood of improvement in their symptoms is fair.  Pt states "you have gotta do what you have to"  Broad spectrum IV abx IVF OR for diagnostic laparoscopy, possible exp laparotomy, possible bowel/gastric resection, repair ulcer  Leighton Ruff. Redmond Pulling, MD, FACS General, Bariatric, & Minimally Invasive Surgery [facility], [place]    [facility] M 11/25/2014, 5:40 AM

## 2014-11-25 NOTE — Progress Notes (Addendum)
ANTIBIOTIC CONSULT NOTE - INITIAL  Pharmacy Consult for Vancomycin and Zosyn Indication: peritonitis  Allergies  Allergen Reactions  . Zithromax [Azithromycin] Other (See Comments)    "Severe stomach cramps; told to list as an allergy by dr. Years ago"  . Avelox [Moxifloxacin Hcl In Nacl] Other (See Comments)    Hemolysis  In 2012  . Bactrim [Sulfamethoxazole-Trimethoprim] Diarrhea and Nausea And Vomiting    Patient Measurements:   Last weight 71.5 kg  Vital Signs: Temp: 97.2 F (36.2 C) (09/18 1115) BP: 119/46 mmHg (09/18 1130) Pulse Rate: 82 (09/18 1130) Intake/Output from previous day: 09/17 0701 - 09/18 0700 In: 250 [IV Piggyback:250] Out: 375 [Urine:375] Intake/Output from this shift: Total I/O In: 3500 [I.V.:3500] Out: 585 [Urine:425; Emesis/NG output:100; Drains:50; Blood:10]  Labs:  Recent Labs  11/24/14 1216 11/25/14 0246 11/25/14 0255  WBC 16.4* 12.2*  --   HGB 14.8 14.6 15.3  PLT 225 218  --   CREATININE 1.15  --  1.70*   CrCl cannot be calculated (Unknown ideal weight.). No results for input(s): VANCOTROUGH, VANCOPEAK, VANCORANDOM, GENTTROUGH, GENTPEAK, GENTRANDOM, TOBRATROUGH, TOBRAPEAK, TOBRARND, AMIKACINPEAK, AMIKACINTROU, AMIKACIN in the last 72 hours.   Microbiology: No results found for this or any previous visit (from the past 720 hour(s)).  Medical History: Past Medical History  Diagnosis Date  . GERD (gastroesophageal reflux disease)   . Type 2 diabetes mellitus   . History of hemolytic anemia     02/2011  secondary to avelox medication  . History of atrial flutter     EPISODE 02/2011  CONVERTED TO NSR WITH CARDIZEM  . COPD (chronic obstructive pulmonary disease)   . History of chronic bronchitis   . Diabetic retinopathy of both eyes   . Smokers' cough   . Productive cough   . Arthritis   . DDD (degenerative disc disease)   . HOH (hard of hearing)   . Chronic back pain   . Bladder cancer     Medications:  Anti-infectives    Start     Dose/Rate Route Frequency Ordered Stop   11/25/14 0430  vancomycin (VANCOCIN) IVPB 1000 mg/200 mL premix     1,000 mg 200 mL/hr over 60 Minutes Intravenous  Once 11/25/14 0420 11/25/14 0638   11/25/14 0430  piperacillin-tazobactam (ZOSYN) IVPB 3.375 g     3.375 g 100 mL/hr over 30 Minutes Intravenous  Once 11/25/14 0420 11/25/14 0547     Assessment: 59 year old male admitted with perforated gastric ulcer and peritonitis.  He is s/p laparoscopic repair and is to continue antibiotic therapy with Vancomycin and Zosyn.  Note his SCr was up to 1.7 today on point of care testing in the ED.  This is known to not be accurate for SCr and I will reassess his renal function before making dose determinations.  His Vancomycin dose given at 0430 this morning is still providing coverage. He needs a Zosyn dose.  Goal of Therapy:  Vancomycin trough level 15-20 mcg/ml  Plan:  Zosyn 3.375gm IV q8h extended infusion Await SCr results on BMET for Vancomycin dosing  Legrand Como, Pharm.D., BCPS, AAHIVP Clinical Pharmacist Phone: [phone] or [phone] 11/25/2014, 12:26 PM  SCr 1.05, Est CrCl ~75 ml/min.  Vancomycin 750mg  IV q12h.  Norva Riffle

## 2014-11-25 NOTE — Progress Notes (Addendum)
11/25/2014 patient came from periop to 2central he was drowsy and sleepy, but arousable when arrive on unit. He is on a morphine drip reduce dose  And NGT to left nose to low intermittent wall suction. Patient been having dark bloody drainage. Patient have two area which is clean and dry and right side of abdomen JP drain. The site is clean and dry. He have scab on right hand and upper left shoulder. He was place on bed alarm. [facility] RN.

## 2014-11-25 NOTE — Anesthesia Postprocedure Evaluation (Signed)
  Anesthesia Post-op Note  Patient: Thomas Sandoval.  Procedure(s) Performed: Procedure(s) (LRB): LAPAROSCOPIC PRIMARY REPAIR OF PERFORATED PREPYLORIC ULCER WITH GRAHAM PATCH (N/A)  Patient Location: PACU  Anesthesia Type: General  Level of Consciousness: awake and alert   Airway and Oxygen Therapy: Patient Spontanous Breathing  Post-op Pain: mild  Post-op Assessment: Post-op Vital signs reviewed, Patient's Cardiovascular Status Stable, Respiratory Function Stable, Patent Airway and No signs of Nausea or vomiting  Last Vitals:  Filed Vitals:   11/25/14 1030  BP: 120/50  Pulse: 89  Temp:   Resp: 22    Post-op Vital Signs: stable   Complications: No apparent anesthesia complications

## 2014-11-25 NOTE — Anesthesia Preprocedure Evaluation (Addendum)
Anesthesia Evaluation  Patient identified by MRN, date of birth, ID band Patient awake    Reviewed: Allergy & Precautions, NPO status , Patient's Chart, lab work & pertinent test results  History of Anesthesia Complications Negative for: history of anesthetic complications  Airway Mallampati: II  TM Distance: >3 FB Neck ROM: Full    Dental  (+) Dental Advisory Given, Missing   Pulmonary COPD,  COPD inhaler, Current Smoker (2+ PPD),    Pulmonary exam normal breath sounds clear to auscultation       Cardiovascular hypertension, Normal cardiovascular exam Rhythm:Regular Rate:Normal     Neuro/Psych negative neurological ROS  negative psych ROS   GI/Hepatic Neg liver ROS, GERD  ,  Endo/Other  diabetes, Type 2, Insulin Dependent  Renal/GU negative Renal ROS  negative genitourinary   Musculoskeletal negative musculoskeletal ROS (+)   Abdominal   Peds negative pediatric ROS (+)  Hematology negative hematology ROS (+)   Anesthesia Other Findings Also discuss poss. Of post-op ventilation  Reproductive/Obstetrics negative OB ROS                        Anesthesia Physical Anesthesia Plan  ASA: III and emergent  Anesthesia Plan: General   Post-op Pain Management:    Induction: Intravenous, Rapid sequence and Cricoid pressure planned  Airway Management Planned: Oral ETT  Additional Equipment: Arterial line  Intra-op Plan:   Post-operative Plan: Possible Post-op intubation/ventilation  Informed Consent: I have reviewed the patients History and Physical, chart, labs and discussed the procedure including the risks, benefits and alternatives for the proposed anesthesia with the patient or authorized representative who has indicated his/her understanding and acceptance.   Dental advisory given  Plan Discussed with: CRNA and Surgeon  Anesthesia Plan Comments:       Anesthesia Quick  Evaluation

## 2014-11-25 NOTE — Progress Notes (Signed)
Utilization Review Completed.Dowell, Thomas Sandoval  

## 2014-11-25 NOTE — ED Provider Notes (Signed)
CSN: [id number]     Arrival date & time 11/24/14  2241 History   This chart was scribed for Thomas Palumbo, MD by Forrestine Him, ED Scribe. This patient was seen in room A11C/A11C and the patient's care was started 1:32 AM.   Chief Complaint  Patient presents with  . Emesis   Patient is a 59 y.o. male presenting with vomiting. The history is provided by the patient. No language interpreter was used.  Emesis Severity:  Moderate Duration:  1 day Timing:  Intermittent Progression:  Unchanged Chronicity:  New Recent urination:  Normal Relieved by:  Nothing Worsened by:  Nothing tried Ineffective treatments: zofran ODT. Associated symptoms: abdominal pain   Associated symptoms: no chills, no diarrhea and no headaches   Abdominal pain:    Location:  Generalized   Quality:  Sharp   Severity:  Severe   Onset quality:  Gradual   Timing:  Constant   Progression:  Worsening   Chronicity:  New Risk factors: no sick contacts and no travel to endemic areas     HPI Comments: Thomas Sandoval. is a 59 y.o. male with a PMHx of DM, COPD, bladder cancer, and HOH who presents to the Emergency Department complaining of intermittent, ongoing, worsening vomiting onset 2 days. No blood noted in vomit. Pt states he is unable to keep any fluids or solid foods down at this time. Associated abdominal pain also reports. Pain is made worse with deep palpation. No alleviating factors at this time. No OTC/prescribed medications attempted prior to arrival. No recent fever, chills, chest pain, or shortness of breath. Thomas Sandoval was evaluated earlier this afternoon for same. Chest X-ray and blood work performed; both unremarkable. Pt was also given fluids and Toradol and safely discharged home with a prescription for Zofran. He denies any improvement since last visit. Pt with known allergies to Zithromax, Avelox, and Bactrim.   Past Medical History  Diagnosis Date  . GERD (gastroesophageal reflux disease)   .  Type 2 diabetes mellitus   . History of hemolytic anemia     02/2011  secondary to avelox medication  . History of atrial flutter     EPISODE 02/2011  CONVERTED TO NSR WITH CARDIZEM  . COPD (chronic obstructive pulmonary disease)   . History of chronic bronchitis   . Diabetic retinopathy of both eyes   . Smokers' cough   . Productive cough   . Arthritis   . DDD (degenerative disc disease)   . HOH (hard of hearing)   . Chronic back pain   . Bladder cancer    Past Surgical History  Procedure Laterality Date  . Esophagogastroduodenoscopy N/A 02/06/2013    Procedure: ESOPHAGOGASTRODUODENOSCOPY (EGD);  Surgeon: Lafayette Dragon, MD;  Location: [facility] ENDOSCOPY;  Service: Endoscopy;  Laterality: N/A;  . Transthoracic echocardiogram  02-17-2011    MODERATE LVH/  EF 65%  . Tonsillectomy  as child  . Tympanic membrane repair  as child  . Transurethral resection of bladder tumor with gyrus (turbt-gyrus) N/A 06/12/2013    Procedure: TRANSURETHRAL RESECTION OF BLADDER TUMOR WITH GYRUS (TURBT-GYRUS);  Surgeon: Claybon Jabs, MD;  Location: [facility];  Service: Urology;  Laterality: N/A;  . Cystoscopy with ureteroscopy Right 08/14/2013    Procedure: CYSTOSCOPY WITH URETEROSCOPY BLADDER BIOPSY ;  Surgeon: Claybon Jabs, MD;  Location: [facility];  Service: Urology;  Laterality: Right;   Family History  Problem Relation Age of Onset  . Breast cancer Mother   .  Cancer Mother     Breast  . Rheumatic fever Father   . Heart disease Father   . Heart attack Father     Massive   . Diabetes Son    Social History  Substance Use Topics  . Smoking status: Current Every Day Smoker -- 2.00 packs/day for 30 years    Types: Cigarettes  . Smokeless tobacco: Former Systems developer    Quit date: 06/08/1978  . Alcohol Use: No    Review of Systems  Constitutional: Negative for fever and chills.  Respiratory: Negative for cough and shortness of breath.   Cardiovascular: Negative for chest  pain.  Gastrointestinal: Positive for nausea, vomiting and abdominal pain. Negative for diarrhea.  Genitourinary: Negative for dysuria.  Musculoskeletal: Negative for back pain.  Skin: Negative for rash.  Neurological: Negative for headaches.  Psychiatric/Behavioral: Negative for confusion.  All other systems reviewed and are negative.     Allergies  Zithromax; Avelox; and Bactrim  Home Medications   Prior to Admission medications   Medication Sig Start Date End Date Taking? Authorizing Provider  acetaminophen (TYLENOL) 325 MG tablet Take 2 tablets (650 mg total) by mouth every 6 (six) hours as needed for mild pain (or Fever >/= 101). 02/06/13   Delfina Redwood, MD  Aclidinium Bromide 400 MCG/ACT AEPB Inhale 1 puff into the lungs as needed (PER PT USING AS PRN -- PRESCRIBED QD).  02/01/13   Vernie Shanks, MD  ADVAIR DISKUS 250-50 MCG/DOSE AEPB INHALE 1 PUFF INTO THE LUNGS EVERY EVENING. 05/23/14   Chipper Herb, MD  albuterol (PROVENTIL HFA;VENTOLIN HFA) 108 (90 BASE) MCG/ACT inhaler Inhale 2 puffs into the lungs every 6 (six) hours as needed for wheezing or shortness of breath. 09/14/14   Lodema Pilot, PA-C  amoxicillin-clavulanate (AUGMENTIN) 875-125 MG per tablet Take 1 tablet by mouth 2 (two) times daily. 09/19/14   Sharion Balloon, FNP  benzonatate (TESSALON) 200 MG capsule Take 1 capsule (200 mg total) by mouth 3 (three) times daily as needed. 09/12/14   Sharion Balloon, FNP  blood glucose meter kit and supplies KIT Dispense based on patient and insurance preference. Use 10 times daily as directed. (FOR ICD-10 E11.22). 04/10/14   Lysbeth Penner, FNP  cilostazol (PLETAL) 100 MG tablet Take 1 tablet (100 mg total) by mouth 2 (two) times daily before a meal. 05/07/14   Serafina Mitchell, MD  famotidine (PEPCID) 20 MG tablet Take 20 mg by mouth every morning.    Historical Provider, MD  HYDROcodone-acetaminophen (NORCO) 7.5-325 MG per tablet Take 1-2 tablets by mouth every 4 (four)  hours as needed for moderate pain. Maximum dose per 24 hours - 8 pills 08/14/13   Kathie Rhodes, MD  HYDROcodone-homatropine [facility]) 5-1.5 MG/5ML syrup Take 5 mLs by mouth every 8 (eight) hours as needed for cough. 09/12/14   Sharion Balloon, FNP  ibuprofen (ADVIL,MOTRIN) 200 MG tablet Take 200 mg by mouth every 6 (six) hours as needed.    Historical Provider, MD  insulin aspart (NOVOLOG) 100 UNIT/ML injection Inject 2-7 Units into the skin 3 (three) times daily before meals. PER SLIDING SCALE    Historical Provider, MD  Insulin Syringe-Needle U-100 (B-D INS SYRINGE 0.5CC/30GX1/2") 30G X 1/2" 0.5 ML MISC Administer Insulin twice daily as directed. DX E22.11 04/10/14   Lysbeth Penner, FNP  LANTUS 100 UNIT/ML injection INJECT 40 UNITS SUBCUTANEOUSLY EVERY MORNING NEEDS TO BE SEEN FOR NEXT REFILL 05/10/14   Chipper Herb, MD  loratadine (CLARITIN) 10 MG tablet Take 10 mg by mouth at bedtime.      Historical Provider, MD  ondansetron (ZOFRAN ODT) 4 MG disintegrating tablet Take 1 tablet (4 mg total) by mouth every 8 (eight) hours as needed for nausea or vomiting. 11/24/14   Deno Etienne, DO  PROAIR HFA 108 (90 BASE) MCG/ACT inhaler INHALE 2 PUFFS EVERY 6 HOURS AS NEEDED FOR WHEEZING OR SHORTNESS OF BREATH 04/06/13   Tiffany A Gann, PA-C   Triage Vitals: BP 89/53 mmHg  Pulse 125  Temp(Src) 98.2 F (36.8 C) (Oral)  Resp 20  SpO2 100%   Physical Exam  Constitutional: He is oriented to person, place, and time. He appears well-developed and well-nourished.  HENT:  Head: Normocephalic and atraumatic.  Mouth/Throat: Oropharynx is clear and moist. No oropharyngeal exudate.  Trachea is midline   Eyes: EOM are normal. Pupils are equal, round, and reactive to light.  Neck: Normal range of motion. Neck supple.  Cardiovascular: Normal rate, regular rhythm, normal heart sounds and intact distal pulses.   Pulmonary/Chest: Effort normal and breath sounds normal. No respiratory distress. He has no wheezes. He has no  rales.  Abdominal: He exhibits no mass. Bowel sounds are decreased. There is generalized tenderness. There is guarding. There is no rigidity.  Musculoskeletal: Normal range of motion.  Neurological: He is alert and oriented to person, place, and time. He has normal reflexes.  Skin: Skin is warm and dry.  Psychiatric: He has a normal mood and affect. Judgment normal.  Nursing note and vitals reviewed.   ED Course  Procedures (including critical care time)  DIAGNOSTIC STUDIES: Oxygen Saturation is 100% on RA, Normal by my interpretation.    COORDINATION OF CARE: 1:38 AM- Will give Zofran and fluids. Will order CT abdomen pelvis with contrast, urinalysis, Lipase, CBC, i-stat chem 8, i-stat troponin, and CBG. Discussed treatment plan with pt at bedside and pt agreed to plan.     Labs Review Labs Reviewed  CBG MONITORING, ED - Abnormal; Notable for the following:    Glucose-Capillary 182 (*)    All other components within normal limits    Imaging Review Dg Chest 2 View  11/24/2014   CLINICAL DATA:  Productive cough with nausea about a sweats and chills since yesterday  EXAM: CHEST  2 VIEW  COMPARISON:  03/27/2014  FINDINGS: The heart size and mediastinal contours are within normal limits. Both lungs are clear. The visualized skeletal structures are unremarkable.  IMPRESSION: No active cardiopulmonary disease.   Electronically Signed   By: Skipper Cliche M.D.   On: 11/24/2014 13:04   I have personally reviewed and evaluated these images and lab results as part of my medical decision-making.   EKG Interpretation None      MDM   Final diagnoses:  None     EKG Interpretation  Date/Time:  Sunday November 25 2014 00:39:05 EDT Ventricular Rate:  75 PR Interval:  148 QRS Duration: 80 QT Interval:  420 QTC Calculation: 469 R Axis:   43 Text Interpretation:  Normal sinus rhythm Confirmed by [facility]  MD, Thomas ([id number]) on 11/25/2014 1:35:29 AM      Results for orders  placed or performed during the hospital encounter of 11/25/14  CBC with Differential/Platelet  Result Value Ref Range   WBC 12.2 (H) 4.0 - 10.5 K/uL   RBC 4.54 4.22 - 5.81 MIL/uL   Hemoglobin 14.6 13.0 - 17.0 g/dL   HCT 41.9 39.0 - 52.0 %   MCV  92.3 78.0 - 100.0 fL   MCH 32.2 26.0 - 34.0 pg   MCHC 34.8 30.0 - 36.0 g/dL   RDW 12.8 11.5 - 15.5 %   Platelets 218 150 - 400 K/uL   Neutrophils Relative % 89 %   Neutro Abs 10.9 (H) 1.7 - 7.7 K/uL   Lymphocytes Relative 3 %   Lymphs Abs 0.4 (L) 0.7 - 4.0 K/uL   Monocytes Relative 8 %   Monocytes Absolute 0.9 0.1 - 1.0 K/uL   Eosinophils Relative 0 %   Eosinophils Absolute 0.0 0.0 - 0.7 K/uL   Basophils Relative 0 %   Basophils Absolute 0.0 0.0 - 0.1 K/uL  Urinalysis, Routine w reflex microscopic (not at [facility])  Result Value Ref Range   Color, Urine AMBER (A) YELLOW   APPearance CLEAR CLEAR   Specific Gravity, Urine 1.020 1.005 - 1.030   pH 5.5 5.0 - 8.0   Glucose, UA 100 (A) NEGATIVE mg/dL   Hgb urine dipstick NEGATIVE NEGATIVE   Bilirubin Urine SMALL (A) NEGATIVE   Ketones, ur 15 (A) NEGATIVE mg/dL   Protein, ur 30 (A) NEGATIVE mg/dL   Urobilinogen, UA 1.0 0.0 - 1.0 mg/dL   Nitrite NEGATIVE NEGATIVE   Leukocytes, UA NEGATIVE NEGATIVE  Lipase, blood  Result Value Ref Range   Lipase 15 (L) 22 - 51 U/L  Urine microscopic-add on  Result Value Ref Range   Squamous Epithelial / LPF RARE RARE   Bacteria, UA RARE RARE   Casts HYALINE CASTS (A) NEGATIVE   Urine-Other MUCOUS PRESENT   CBG monitoring, ED  Result Value Ref Range   Glucose-Capillary 182 (H) 65 - 99 mg/dL  I-stat chem 8, ed  Result Value Ref Range   Sodium 138 135 - 145 mmol/L   Potassium 4.9 3.5 - 5.1 mmol/L   Chloride 102 101 - 111 mmol/L   BUN 30 (H) 6 - 20 mg/dL   Creatinine, Ser 1.70 (H) 0.61 - 1.24 mg/dL   Glucose, Bld 192 (H) 65 - 99 mg/dL   Calcium, Ion 1.13 1.12 - 1.23 mmol/L   TCO2 24 0 - 100 mmol/L   Hemoglobin 15.3 13.0 - 17.0 g/dL   HCT 45.0 39.0 -  52.0 %  I-stat troponin, ED  Result Value Ref Range   Troponin i, poc 0.04 0.00 - 0.08 ng/mL   Comment 3           Dg Chest 2 View  11/24/2014   CLINICAL DATA:  Productive cough with nausea about a sweats and chills since yesterday  EXAM: CHEST  2 VIEW  COMPARISON:  03/27/2014  FINDINGS: The heart size and mediastinal contours are within normal limits. Both lungs are clear. The visualized skeletal structures are unremarkable.  IMPRESSION: No active cardiopulmonary disease.   Electronically Signed   By: Skipper Cliche M.D.   On: 11/24/2014 13:04   Ct Abdomen Pelvis W Contrast  11/25/2014   CLINICAL DATA:  59 year old male with severe abdominal pain nausea no vomiting. History of bladder cancer and COPD. Questionable intra-abdominal free air was seen on earlier radiograph.  EXAM: CT ABDOMEN AND PELVIS WITH CONTRAST  TECHNIQUE: Multidetector CT imaging of the abdomen and pelvis was performed using the standard protocol following bolus administration of intravenous contrast.  CONTRAST:  134m OMNIPAQUE IOHEXOL 300 MG/ML  SOLN  COMPARISON:  Radiograph dated 11/25/2014 and CT dated 04/24/2013  FINDINGS: The visualized lung bases are clear. Moderate pneumoperitoneum. Small ascites and diffuse mesenteric stranding.  The liver, gallbladder,  pancreas, spleen, and adrenal glands appear unremarkable. There is a small nonobstructing left renal calculus. No hydronephrosis. The right kidney appears unremarkable. The visualized ureters and urinary bladder appear unremarkable. The previously described bladder mass is not visualized on the current study. The prostate gland is grossly unremarkable.  Evaluation of the bowel is very limited in the absence of oral contrast as well as diffuse mesentery stranding and fluid. Moderate dense stool noted throughout the colon compatible with constipation. There is apparent thickening of the wall of the stomach which may be partly related to underdistention or represent gastritis.  Clinical correlation is recommended. There is no evidence of bowel obstruction. Evaluation of the right lower quadrant and appendix is limited due to fluid in the mesentery as well as motion artifact on the coronal images. A small tubular structure extending from the base of the cecum posteriorly likely represents a normal appendix (best seen on coronal images 41-57). A loop of bowel inferior to the cecum likely represents a segment of small bowel. There is apparent diffuse small bowel edema.  There is aortoiliac atherosclerotic disease with atherosclerotic calcification of the mesenteric vessels. There is focal narrowing at the origin of the celiac axis. The origin of the celiac axis, SMA, and IMA however remain patent. No portal venous gas identified. There is no lymphadenopathy.  Small fat containing umbilical hernia. Extensive degenerative changes of the spine. No acute fracture.  IMPRESSION: Pneumoperitoneum of indeterminate etiology but concerning for bowel perforation. Clinical correlation and surgical consult is advised.  Small ascites.  Diffuse mesentery edema  Apparent diffuse thickening and edema of the stomach and small bowel which may be partly related to ascites. Evaluation of the bowel is limited in the absence of oral contrast. No evidence of bowel obstruction.  Critical Value/emergent results were called by telephone at the time of interpretation on 11/25/2014 at 4:19 am to Dr. Veatrice Kells , who verbally acknowledged these results.   Electronically Signed   By: Anner Crete M.D.   On: 11/25/2014 04:36   Dg Abd Acute W/chest  11/25/2014   CLINICAL DATA:  59 year old male with history of diabetes and COPD presenting with intermittent and worsening vomiting abdominal pain.  EXAM: DG ABDOMEN ACUTE W/ 1V CHEST  COMPARISON:  Chest radiograph dated 11/24/2014  FINDINGS: There is moderate stool throughout the colon. No evidence of bowel obstruction. Air is noted under the right hemidiaphragm on the  left lateral decubitus images with air-fluid level appearance. Although this may represent air within the colon, intraperitoneal air is not excluded. CT is recommended for further evaluation. There is degenerative changes of the spine. No acute fracture. No radiopaque calculi identified.  The lungs are clear. No pleural effusion or pneumothorax. The cardiac silhouette is within normal limits.  IMPRESSION: Air under the right hemidiaphragm concerning for pneumoperitoneum. CT is recommended for further evaluation.  Critical Value/emergent results were called by telephone at the time of interpretation on 11/25/2014 at 3:20 am to Dr. Veatrice Kells , who verbally acknowledged these results.   Electronically Signed   By: Anner Crete M.D.   On: 11/25/2014 03:20    Medications  ondansetron (ZOFRAN-ODT) disintegrating tablet 8 mg (8 mg Oral Given 11/24/14 2302)  ondansetron (ZOFRAN) injection 4 mg (4 mg Intravenous Given 11/25/14 0143)  sodium chloride 0.9 % bolus 1,000 mL (0 mLs Intravenous Stopped 11/25/14 0348)  iohexol (OMNIPAQUE) 300 MG/ML solution 100 mL (100 mLs Intravenous Contrast Given 11/25/14 0305)  sodium chloride 0.9 % bolus 1,000 mL (0  mLs Intravenous Stopped 11/25/14 0626)  fentaNYL (SUBLIMAZE) injection 100 mcg (100 mcg Intravenous Given 11/25/14 0347)  vancomycin (VANCOCIN) IVPB 1000 mg/200 mL premix (0 mg Intravenous Stopped 11/25/14 0638)  piperacillin-tazobactam (ZOSYN) IVPB 3.375 g (0 g Intravenous Stopped 11/25/14 0547)  fentaNYL (SUBLIMAZE) injection 100 mcg (100 mcg Intravenous Given 11/25/14 0452)    MDM Reviewed: previous chart, nursing note and vitals Reviewed previous: labs and x-ray Interpretation: labs, x-ray, MRI and ECG (elevated Cr and Wbc count,  xray by me free air on decub called radiolist who concurs.  Ct free air) Total time providing critical care: 75-105 minutes. This excludes time spent performing separately reportable procedures and services. Consults: general  surgery    CRITICAL CARE Performed by: Carlisle Beers Total critical care time: 90 minutes  Critical care time was exclusive of separately billable procedures and treating other patients. Critical care was necessary to treat or prevent imminent or life-threatening deterioration. Critical care was time spent personally by me on the following activities: development of treatment plan with patient and/or surrogate as well as nursing, discussions with consultants, evaluation of patient's response to treatment, examination of patient, obtaining history from patient or surrogate, ordering and performing treatments and interventions, ordering and review of laboratory studies, ordering and review of radiographic studies, pulse oximetry and re-evaluation of patient's condition. I personally performed the services described in this documentation, which was scribed in my presence. The recorded information has been reviewed and is accurate.    Veatrice Kells, MD 11/25/14 [phone]

## 2014-11-25 NOTE — Progress Notes (Signed)
11/25/2014 patient had 100c on bloody drainage from NGT. [facility] RN.

## 2014-11-25 NOTE — Transfer of Care (Signed)
Immediate Anesthesia Transfer of Care Note  Patient: Thomas Sandoval.  Procedure(s) Performed: Procedure(s): LAPAROSCOPIC PRIMARY REPAIR OF PERFORATED PREPYLORIC ULCER WITH GRAHAM PATCH (N/A)  Patient Location: PACU  Anesthesia Type:General  Level of Consciousness: sedated  Airway & Oxygen Therapy: Patient Spontanous Breathing and Patient connected to nasal cannula oxygen  Post-op Assessment: Report given to RN and Post -op Vital signs reviewed and stable  Post vital signs: Reviewed and stable  Last Vitals:  Filed Vitals:   11/25/14 0615  BP: 143/57  Pulse: 76  Temp:   Resp: 26    Complications: No apparent anesthesia complications

## 2014-11-26 ENCOUNTER — Encounter (HOSPITAL_COMMUNITY): Payer: Self-pay | Admitting: General Surgery

## 2014-11-26 DIAGNOSIS — I4891 Unspecified atrial fibrillation: Secondary | ICD-10-CM | POA: Diagnosis not present

## 2014-11-26 DIAGNOSIS — K251 Acute gastric ulcer with perforation: Secondary | ICD-10-CM

## 2014-11-26 LAB — GLUCOSE, CAPILLARY
GLUCOSE-CAPILLARY: 130 mg/dL — AB (ref 65–99)
GLUCOSE-CAPILLARY: 82 mg/dL (ref 65–99)
GLUCOSE-CAPILLARY: 86 mg/dL (ref 65–99)
Glucose-Capillary: 108 mg/dL — ABNORMAL HIGH (ref 65–99)
Glucose-Capillary: 128 mg/dL — ABNORMAL HIGH (ref 65–99)
Glucose-Capillary: 140 mg/dL — ABNORMAL HIGH (ref 65–99)

## 2014-11-26 LAB — POCT I-STAT 4, (NA,K, GLUC, HGB,HCT)
Glucose, Bld: 193 mg/dL — ABNORMAL HIGH (ref 65–99)
HEMATOCRIT: 38 % — AB (ref 39.0–52.0)
HEMOGLOBIN: 12.9 g/dL — AB (ref 13.0–17.0)
Potassium: 4.1 mmol/L (ref 3.5–5.1)
Sodium: 140 mmol/L (ref 135–145)

## 2014-11-26 LAB — CBC
HCT: 37.4 % — ABNORMAL LOW (ref 39.0–52.0)
Hemoglobin: 12.5 g/dL — ABNORMAL LOW (ref 13.0–17.0)
MCH: 31.9 pg (ref 26.0–34.0)
MCHC: 33.4 g/dL (ref 30.0–36.0)
MCV: 95.4 fL (ref 78.0–100.0)
PLATELETS: 183 10*3/uL (ref 150–400)
RBC: 3.92 MIL/uL — ABNORMAL LOW (ref 4.22–5.81)
RDW: 13.3 % (ref 11.5–15.5)
WBC: 12 10*3/uL — AB (ref 4.0–10.5)

## 2014-11-26 LAB — COMPREHENSIVE METABOLIC PANEL
ALT: 15 U/L — AB (ref 17–63)
AST: 23 U/L (ref 15–41)
Albumin: 2.6 g/dL — ABNORMAL LOW (ref 3.5–5.0)
Alkaline Phosphatase: 44 U/L (ref 38–126)
Anion gap: 7 (ref 5–15)
BILIRUBIN TOTAL: 1 mg/dL (ref 0.3–1.2)
BUN: 16 mg/dL (ref 6–20)
CO2: 26 mmol/L (ref 22–32)
CREATININE: 1.06 mg/dL (ref 0.61–1.24)
Calcium: 8.3 mg/dL — ABNORMAL LOW (ref 8.9–10.3)
Chloride: 104 mmol/L (ref 101–111)
GFR calc Af Amer: >60 mL/min (ref >60)
Glucose, Bld: 97 mg/dL (ref 65–99)
Potassium: 4.3 mmol/L (ref 3.5–5.1)
Sodium: 137 mmol/L (ref 135–145)
TOTAL PROTEIN: 5.4 g/dL — AB (ref 6.5–8.1)

## 2014-11-26 MED ORDER — METOPROLOL TARTRATE 1 MG/ML IV SOLN
5.0000 mg | Freq: Once | INTRAVENOUS | Status: AC
  Administered 2014-11-26: 5 mg via INTRAVENOUS
  Filled 2014-11-26: qty 5

## 2014-11-26 MED ORDER — METOPROLOL TARTRATE 1 MG/ML IV SOLN
2.5000 mg | INTRAVENOUS | Status: DC | PRN
  Administered 2014-11-30: 2.5 mg via INTRAVENOUS
  Filled 2014-11-26 (×2): qty 5

## 2014-11-26 MED ORDER — ESMOLOL HCL-SODIUM CHLORIDE 2000 MG/100ML IV SOLN
25.0000 ug/kg/min | INTRAVENOUS | Status: DC
  Administered 2014-11-26 – 2014-11-27 (×2): 25 ug/kg/min via INTRAVENOUS
  Filled 2014-11-26 (×2): qty 100

## 2014-11-26 MED ORDER — FLUCONAZOLE IN SODIUM CHLORIDE 400-0.9 MG/200ML-% IV SOLN
400.0000 mg | INTRAVENOUS | Status: DC
  Administered 2014-11-26 – 2014-11-30 (×5): 400 mg via INTRAVENOUS
  Filled 2014-11-26 (×7): qty 200

## 2014-11-26 NOTE — Progress Notes (Signed)
Pt lying in bed resting. Pt has a heart rate between 130's to 140. BP 136/73, Resp 20. Dr. Redmond Pulling notifed. Orders given to get a 12 lead EKG and give Lopressor 5mg  IV once.

## 2014-11-26 NOTE — Progress Notes (Signed)
Attempted report to 47M no answer

## 2014-11-26 NOTE — Progress Notes (Signed)
Pt alert lying in bed. Wife at bedside. Pt denies having any chest pain. EKG done and reads A fib with RVR with a rate of 130. Lopressor 5mg  IV given. Pt heart rate between 100-110 at this time. Dr. Redmond Pulling made aware.

## 2014-11-26 NOTE — Progress Notes (Signed)
Attempted Report to 71M

## 2014-11-26 NOTE — Consult Note (Signed)
CARDIOLOGY CONSULT NOTE   Patient ID: Thomas Sandoval. MRN: [id number] DOB/AGE: [birth year] 59 y.o.  Admit date: 11/25/2014  Primary Physician   Chevis Pretty, FNP Primary Cardiologist   Dr Lia Foyer and Dr Caryl Comes in 2012, in-hosp Reason for Consultation   Atrial fib, chest pain.  [id number] D Natnael Biederman. is a 59 y.o. year old male with a history of DM, COPD, hemolytic anemia 2nd Avelox (nadir 5.5/15.4) with atrial flutter during that admission. EF nl, pt did not follow up as OP. He is on Pletal for PAD per Dr Trula Slade. He continues to smoke 2 ppd.   Admitted 09/18 with N&V, abdominal pain. Peritonitis with free air on CT. Had perforated pre-pyloric ulcer that was repaired laparoscopically. Bloody NG drainage postop and UGI planned on 09/21.   Today, pt converted from SR to atrial fib just after 1 PM, rapid ventricular response. Heart rates in the 130s+, cards asked to see.    Mr. Ribas has no idea that he is in atrial fibrillation. He is not aware of any kind of rapid or irregular heart rate. He does not remember any particular symptoms when his heart rate was irregular in 2012. Prior to admission, he was having no palpitations, presyncope or lightheadedness. He is having problems with pain from his surgery and it becomes severe when he gets hiccups.  He has had some episodes of chest pain past, but this has been in association with significant upper body exertion and he feels the pain is from muscular exertion and not from his heart. He gets significant back problem after he exerts himself because of his known degenerative disc disease.  He has problems with claudication symptoms. He saw Dr. Trula Slade, who diagnosed vascular disease and put him on Pletal. He can walk 300 or 400 feet but then has to stop and sit down.  Past Medical History  Diagnosis Date  . GERD (gastroesophageal reflux disease)   . Type 1 diabetes mellitus 1977  . History of hemolytic anemia 02/2011    secondary to Avelox  . History of atrial flutter 02/2011    Converted to NSR with Cardizem  . COPD (chronic obstructive pulmonary disease)   . History of chronic bronchitis   . Diabetic retinopathy of both eyes   . Smokers' cough   . Productive cough   . Arthritis   . DDD (degenerative disc disease)   . HOH (hard of hearing)   . Chronic back pain   . Bladder cancer   . HOH (hard of hearing)     no eardrum and nerve damage on R, also HOH on L  . PAD (peripheral artery disease) 04/2014    Dr Trula Slade; bilateral SFA occlusion, R mid, L distal     Past Surgical History  Procedure Laterality Date  . Esophagogastroduodenoscopy N/A 02/06/2013    Procedure: ESOPHAGOGASTRODUODENOSCOPY (EGD);  Surgeon: Lafayette Dragon, MD;  Location: [facility] ENDOSCOPY;  Service: Endoscopy;  Laterality: N/A;  . Transthoracic echocardiogram  02-17-2011    MODERATE LVH/  EF 65%  . Tonsillectomy  as child  . Tympanic membrane repair  as child  . Transurethral resection of bladder tumor with gyrus (turbt-gyrus) N/A 06/12/2013    Procedure: TRANSURETHRAL RESECTION OF BLADDER TUMOR WITH GYRUS (TURBT-GYRUS);  Surgeon: Claybon Jabs, MD;  Location: [facility];  Service: Urology;  Laterality: N/A;  . Cystoscopy with ureteroscopy Right 08/14/2013    Procedure: CYSTOSCOPY WITH URETEROSCOPY BLADDER BIOPSY ;  Surgeon: Elta Guadeloupe  Nedra Hai, MD;  Location: [facility];  Service: Urology;  Laterality: Right;  . Laparoscopy N/A 11/25/2014    Procedure: LAPAROSCOPIC PRIMARY REPAIR OF PERFORATED PREPYLORIC ULCER WITH Silvestre Gunner;  Surgeon: Greer Pickerel, MD;  Location: [place];  Service: General;  Laterality: N/A;    Allergies  Allergen Reactions  . Zithromax [Azithromycin] Other (See Comments)    "Severe stomach cramps; told to list as an allergy by dr. Years ago"  . Avelox [Moxifloxacin Hcl In Nacl] Other (See Comments)    Hemolysis  In 2012  . Bactrim [Sulfamethoxazole-Trimethoprim] Diarrhea and Nausea And  Vomiting    I have reviewed the patient's current medications . antiseptic oral rinse  7 mL Mouth Rinse BID  . fluconazole (DIFLUCAN) IV  400 mg Intravenous Q24H  . heparin subcutaneous  5,000 Units Subcutaneous 3 times per day  . insulin aspart  0-20 Units Subcutaneous 6 times per day  . mometasone-formoterol  2 puff Inhalation BID  . morphine   Intravenous 6 times per day  . pantoprazole (PROTONIX) IV  40 mg Intravenous Q12H  . piperacillin-tazobactam (ZOSYN)  IV  3.375 g Intravenous 3 times per day  . vancomycin  750 mg Intravenous Q12H   . 0.45 % NaCl with KCl 20 mEq / L 125 mL/hr at 11/26/14 0856   albuterol, diphenhydrAMINE **OR** diphenhydrAMINE, naloxone **AND** sodium chloride, ondansetron **OR** ondansetron (ZOFRAN) IV  Medication Sig  acetaminophen (TYLENOL) 325 MG tablet Take 2 tablets (650 mg total) by mouth every 6 (six) hours as needed for mild pain (or Fever >/= 101).  Aclidinium Bromide 400 MCG/ACT AEPB Inhale 1 puff into the lungs as needed (PER PT USING AS PRN -- PRESCRIBED QD).   ADVAIR DISKUS 250-50 MCG/DOSE AEPB INHALE 1 PUFF INTO THE LUNGS EVERY EVENING.  albuterol (PROVENTIL HFA;VENTOLIN HFA) 108 (90 BASE) MCG/ACT inhaler Inhale 2 puffs into the lungs every 6 (six) hours as needed for wheezing or shortness of breath.  amoxicillin-clavulanate (AUGMENTIN) 875-125 MG per tablet Take 1 tablet by mouth 2 (two) times daily.  benzonatate (TESSALON) 200 MG capsule Take 1 capsule (200 mg total) by mouth 3 (three) times daily as needed.  blood glucose meter kit and supplies KIT Dispense based on patient and insurance preference. Use 10 times daily as directed. (FOR ICD-10 E11.22).  cilostazol (PLETAL) 100 MG tablet Take 1 tablet (100 mg total) by mouth 2 (two) times daily before a meal.  famotidine (PEPCID) 20 MG tablet Take 20 mg by mouth every morning.  HYDROcodone-acetaminophen (NORCO) 7.5-325 MG per tablet Take 1-2 tablets by mouth every 4 (four) hours as needed for  moderate pain. Maximum dose per 24 hours - 8 pills  HYDROcodone-homatropine (HYCODAN) 5-1.5 MG/5ML syrup Take 5 mLs by mouth every 8 (eight) hours as needed for cough.  ibuprofen (ADVIL,MOTRIN) 200 MG tablet Take 200 mg by mouth every 6 (six) hours as needed.  insulin aspart (NOVOLOG) 100 UNIT/ML injection Inject 2-7 Units into the skin 3 (three) times daily before meals. PER SLIDING SCALE  Insulin Syringe-Needle U-100 (B-D INS SYRINGE 0.5CC/30GX1/2") 30G X 1/2" 0.5 ML MISC Administer Insulin twice daily as directed. DX E22.11  LANTUS 100 UNIT/ML injection INJECT 40 UNITS SUBCUTANEOUSLY EVERY MORNING NEEDS TO BE SEEN FOR NEXT REFILL  loratadine (CLARITIN) 10 MG tablet Take 10 mg by mouth at bedtime.    ondansetron (ZOFRAN ODT) 4 MG disintegrating tablet Take 1 tablet (4 mg total) by mouth every 8 (eight) hours as needed for nausea or vomiting.  PROAIR HFA 108 (90 BASE) MCG/ACT inhaler INHALE 2 PUFFS EVERY 6 HOURS AS NEEDED FOR WHEEZING OR SHORTNESS OF BREATH     Social History   Social History  . Marital Status: Married    Spouse Name: N/A  . Number of Children: N/A  . Years of Education: N/A   Occupational History  . [employer]    Social History Main Topics  . Smoking status: Current Every Day Smoker -- 2.00 packs/day for 30 years    Types: Cigarettes  . Smokeless tobacco: Former Systems developer    Quit date: 06/08/1978  . Alcohol Use: No  . Drug Use: No  . Sexual Activity: Not on file   Other Topics Concern  . Not on file   Social History Narrative   Lives in [place] with wife and 2 sons.     Family Status  Relation Status Death Age  . Mother Deceased 71  . Father Deceased 97  . Son Alive    Family History  Problem Relation Age of Onset  . Breast cancer Mother   . Cancer Mother     Breast  . Rheumatic fever Father   . Heart disease Father   . Heart attack Father     Massive   . Diabetes Son      ROS:  Full 14 point review of systems complete and found to be negative  unless listed above.  Physical Exam: Blood pressure 161/68, pulse 78, temperature 98.1 F (36.7 C), temperature source Oral, resp. rate 16, height $RemoveBe'5\' 9"'aHXlYwpqs$  (1.753 m), weight 153 lb 14.1 oz (69.8 kg), SpO2 96 %.  General: Well developed, well nourished, male in moderate acute distress Head: Eyes PERRLA, No xanthomas.   Normocephalic and atraumatic, oropharynx without edema or exudate. Dentition: Poor Lungs: Few rales bases, good air exchange Heart: Heart irregular rate and rhythm with S1, S2, no murmur. pulses are 2+ all 4 extrem.   Neck: No carotid bruits. No lymphadenopathy.  JVD not elevated. Abdomen: Bowel sounds present, abdomen soft and tender without masses or hernias noted. Incisions bandaged and dressed with no significant drainage Msk:  No spine or cva tenderness. No weakness, no joint deformities or effusions. Extremities: No clubbing or cyanosis. No edema.  Neuro: Alert and oriented X 3. No focal deficits noted. Psych:  Good affect, responds appropriately Skin: No rashes or lesions noted.  Labs:   Lab Results  Component Value Date   WBC 12.0* 11/26/2014   HGB 12.5* 11/26/2014   HCT 37.4* 11/26/2014   MCV 95.4 11/26/2014   PLT 183 11/26/2014    Recent Labs Lab 11/26/14 0319  NA 137  K 4.3  CL 104  CO2 26  BUN 16  CREATININE 1.06  CALCIUM 8.3*  PROT 5.4*  BILITOT 1.0  ALKPHOS 44  ALT 15*  AST 23  GLUCOSE 97  ALBUMIN 2.6*    Recent Labs  11/25/14 0305  TROPIPOC 0.04   LIPASE  Date/Time Value Ref Range Status  11/25/2014 02:46 AM 15* 22 - 51 U/L Final   TSH  Date/Time Value Ref Range Status  02/26/2011 05:50 AM 2.358 0.350 - 4.500 uIU/mL Final   Echo: 02/17/2011 Left ventricle: The cavity size was normal. Wall thickness was increased in a pattern of moderate LVH. The estimated ejection fraction was 65%. Wall motion was normal; there were no regional wall motion abnormalities.  ECG:  11/26/2014 Atrial ?flutter vs fib, RVR Vent. rate 130 BPM PR  interval * ms QRS duration 84 ms  QT/QTc 308/453 ms P-R-T axes * -11 37  Radiology:  Ct Abdomen Pelvis W Contrast 11/25/2014   CLINICAL DATA:  59 year old male with severe abdominal pain nausea no vomiting. History of bladder cancer and COPD. Questionable intra-abdominal free air was seen on earlier radiograph.  EXAM: CT ABDOMEN AND PELVIS WITH CONTRAST  TECHNIQUE: Multidetector CT imaging of the abdomen and pelvis was performed using the standard protocol following bolus administration of intravenous contrast.  CONTRAST:  17mL OMNIPAQUE IOHEXOL 300 MG/ML  SOLN  COMPARISON:  Radiograph dated 11/25/2014 and CT dated 04/24/2013  FINDINGS: The visualized lung bases are clear. Moderate pneumoperitoneum. Small ascites and diffuse mesenteric stranding.  The liver, gallbladder, pancreas, spleen, and adrenal glands appear unremarkable. There is a small nonobstructing left renal calculus. No hydronephrosis. The right kidney appears unremarkable. The visualized ureters and urinary bladder appear unremarkable. The previously described bladder mass is not visualized on the current study. The prostate gland is grossly unremarkable.  Evaluation of the bowel is very limited in the absence of oral contrast as well as diffuse mesentery stranding and fluid. Moderate dense stool noted throughout the colon compatible with constipation. There is apparent thickening of the wall of the stomach which may be partly related to underdistention or represent gastritis. Clinical correlation is recommended. There is no evidence of bowel obstruction. Evaluation of the right lower quadrant and appendix is limited due to fluid in the mesentery as well as motion artifact on the coronal images. A small tubular structure extending from the base of the cecum posteriorly likely represents a normal appendix (best seen on coronal images 41-57). A loop of bowel inferior to the cecum likely represents a segment of small bowel. There is apparent diffuse  small bowel edema.  There is aortoiliac atherosclerotic disease with atherosclerotic calcification of the mesenteric vessels. There is focal narrowing at the origin of the celiac axis. The origin of the celiac axis, SMA, and IMA however remain patent. No portal venous gas identified. There is no lymphadenopathy.  Small fat containing umbilical hernia. Extensive degenerative changes of the spine. No acute fracture.  IMPRESSION: Pneumoperitoneum of indeterminate etiology but concerning for bowel perforation. Clinical correlation and surgical consult is advised.  Small ascites.  Diffuse mesentery edema  Apparent diffuse thickening and edema of the stomach and small bowel which may be partly related to ascites. Evaluation of the bowel is limited in the absence of oral contrast. No evidence of bowel obstruction.  Critical Value/emergent results were called by telephone at the time of interpretation on 11/25/2014 at 4:19 am to Dr. Veatrice Kells , who verbally acknowledged these results.   Electronically Signed   By: Anner Crete M.D.   On: 11/25/2014 04:36   Dg Abd Acute W/chest 11/25/2014   CLINICAL DATA:  59 year old male with history of diabetes and COPD presenting with intermittent and worsening vomiting abdominal pain.  EXAM: DG ABDOMEN ACUTE W/ 1V CHEST  COMPARISON:  Chest radiograph dated 11/24/2014  FINDINGS: There is moderate stool throughout the colon. No evidence of bowel obstruction. Air is noted under the right hemidiaphragm on the left lateral decubitus images with air-fluid level appearance. Although this may represent air within the colon, intraperitoneal air is not excluded. CT is recommended for further evaluation. There is degenerative changes of the spine. No acute fracture. No radiopaque calculi identified.  The lungs are clear. No pleural effusion or pneumothorax. The cardiac silhouette is within normal limits.  IMPRESSION: Air under the right hemidiaphragm concerning for pneumoperitoneum. CT is  recommended for further evaluation.  Critical Value/emergent results were called by telephone at the time of interpretation on 11/25/2014 at 3:20 am to Dr. Veatrice Kells , who verbally acknowledged these results.   Electronically Signed   By: Anner Crete M.D.   On: 11/25/2014 03:20    ASSESSMENT AND PLAN:   The patient was seen today by Dr Debara Pickett, the patient evaluated and the data reviewed.  Principal Problem:   Perforated gastric ulcer - Management per the surgical service, he is making some progress  Active Problems:   Atrial fibrillation with rapid ventricular response - Patient is completely unaware of this. He may have been having episodes prior to admission but he is not aware of these. - only taking ice chips so we'll use IV medications until his GI status is more stable. - And heart rate is not well controlled with medications, consider cardioversion in a.m. as it could be done without a TEE - will start esmolol gtt since he responded well to IV Lopressor, but pt will have to go to ICU for this. Dr Redmond Pulling aware - transition to PO rx when taking POs well. - only known previous episode was in the setting of another significant acute illness.    Anticoagulation - This patients CHA2DS2-VASc Score and unadjusted Ischemic Stroke Rate (% per year) is equal to 2.2 % stroke rate/year from a score of 2 Above score calculated as 1 point each if present [CHF, HTN, DM, Vascular=MI/PAD/Aortic Plaque, Age if 65-74, or Male], 2 points each if present [Age > 75, or Stroke/TIA/TE] - However, he has significant bleeding issues and the risk outweighs the benefit at this time. - Continue DVT prophylaxis - once cleared for anticoagulation by the surgeons, re-assess  Signed: Rosaria Ferries, PA-C 11/26/2014 4:06 PM Beeper [phone]  Co-Sign MD

## 2014-11-26 NOTE — Progress Notes (Signed)
1 Day Post-Op  Subjective: Pain ok. Wants to drink. Has done some IS.   Objective: Vital signs in last 24 hours: Temp:  [97.2 F (36.2 C)-98.3 F (36.8 C)] 97.8 F (36.6 C) (09/19 0828) Pulse Rate:  [70-95] 83 (09/19 0828) Resp:  [13-28] 23 (09/19 0828) BP: (117-162)/(43-71) 151/62 mmHg (09/19 0828) SpO2:  [89 %-97 %] 96 % (09/19 0828) FiO2 (%):  [100 %] 100 % (09/18 1707) Weight:  [69.8 kg (153 lb 14.1 oz)] 69.8 kg (153 lb 14.1 oz) (09/18 1146) Last BM Date: 11/24/14  Intake/Output from previous day: 09/18 0701 - 09/19 0700 In: 4230 [I.V.:3740; NG/GT:20; IV Piggyback:400] Out: 8841 [Urine:1475; Emesis/NG output:100; Drains:80; Blood:10] Intake/Output this shift:    Awake, nontoxic cta b/l Reg Soft, mild distension, min TTP; drain - serous No edema. +SCDs  Lab Results:   Recent Labs  11/25/14 0246 11/25/14 0255 11/26/14 0319  WBC 12.2*  --  12.0*  HGB 14.6 15.3 12.5*  HCT 41.9 45.0 37.4*  PLT 218  --  183   BMET  Recent Labs  11/25/14 1445 11/26/14 0319  NA 139 137  K 4.1 4.3  CL 110 104  CO2 24 26  GLUCOSE 183* 97  BUN 19 16  CREATININE 1.05 1.06  CALCIUM 7.8* 8.3*   PT/INR No results for input(s): LABPROT, INR in the last 72 hours. ABG No results for input(s): PHART, HCO3 in the last 72 hours.  Invalid input(s): PCO2, PO2  Studies/Results: Dg Chest 2 View  11/24/2014   CLINICAL DATA:  Productive cough with nausea about a sweats and chills since yesterday  EXAM: CHEST  2 VIEW  COMPARISON:  03/27/2014  FINDINGS: The heart size and mediastinal contours are within normal limits. Both lungs are clear. The visualized skeletal structures are unremarkable.  IMPRESSION: No active cardiopulmonary disease.   Electronically Signed   By: Skipper Cliche M.D.   On: 11/24/2014 13:04   Ct Abdomen Pelvis W Contrast  11/25/2014   CLINICAL DATA:  59 year old male with severe abdominal pain nausea no vomiting. History of bladder cancer and COPD. Questionable  intra-abdominal free air was seen on earlier radiograph.  EXAM: CT ABDOMEN AND PELVIS WITH CONTRAST  TECHNIQUE: Multidetector CT imaging of the abdomen and pelvis was performed using the standard protocol following bolus administration of intravenous contrast.  CONTRAST:  170mL OMNIPAQUE IOHEXOL 300 MG/ML  SOLN  COMPARISON:  Radiograph dated 11/25/2014 and CT dated 04/24/2013  FINDINGS: The visualized lung bases are clear. Moderate pneumoperitoneum. Small ascites and diffuse mesenteric stranding.  The liver, gallbladder, pancreas, spleen, and adrenal glands appear unremarkable. There is a small nonobstructing left renal calculus. No hydronephrosis. The right kidney appears unremarkable. The visualized ureters and urinary bladder appear unremarkable. The previously described bladder mass is not visualized on the current study. The prostate gland is grossly unremarkable.  Evaluation of the bowel is very limited in the absence of oral contrast as well as diffuse mesentery stranding and fluid. Moderate dense stool noted throughout the colon compatible with constipation. There is apparent thickening of the wall of the stomach which may be partly related to underdistention or represent gastritis. Clinical correlation is recommended. There is no evidence of bowel obstruction. Evaluation of the right lower quadrant and appendix is limited due to fluid in the mesentery as well as motion artifact on the coronal images. A small tubular structure extending from the base of the cecum posteriorly likely represents a normal appendix (best seen on coronal images 41-57). A loop  of bowel inferior to the cecum likely represents a segment of small bowel. There is apparent diffuse small bowel edema.  There is aortoiliac atherosclerotic disease with atherosclerotic calcification of the mesenteric vessels. There is focal narrowing at the origin of the celiac axis. The origin of the celiac axis, SMA, and IMA however remain patent. No  portal venous gas identified. There is no lymphadenopathy.  Small fat containing umbilical hernia. Extensive degenerative changes of the spine. No acute fracture.  IMPRESSION: Pneumoperitoneum of indeterminate etiology but concerning for bowel perforation. Clinical correlation and surgical consult is advised.  Small ascites.  Diffuse mesentery edema  Apparent diffuse thickening and edema of the stomach and small bowel which may be partly related to ascites. Evaluation of the bowel is limited in the absence of oral contrast. No evidence of bowel obstruction.  Critical Value/emergent results were called by telephone at the time of interpretation on 11/25/2014 at 4:19 am to Dr. Veatrice Kells , who verbally acknowledged these results.   Electronically Signed   By: Anner Crete M.D.   On: 11/25/2014 04:36   Dg Abd Acute W/chest  11/25/2014   CLINICAL DATA:  59 year old male with history of diabetes and COPD presenting with intermittent and worsening vomiting abdominal pain.  EXAM: DG ABDOMEN ACUTE W/ 1V CHEST  COMPARISON:  Chest radiograph dated 11/24/2014  FINDINGS: There is moderate stool throughout the colon. No evidence of bowel obstruction. Air is noted under the right hemidiaphragm on the left lateral decubitus images with air-fluid level appearance. Although this may represent air within the colon, intraperitoneal air is not excluded. CT is recommended for further evaluation. There is degenerative changes of the spine. No acute fracture. No radiopaque calculi identified.  The lungs are clear. No pleural effusion or pneumothorax. The cardiac silhouette is within normal limits.  IMPRESSION: Air under the right hemidiaphragm concerning for pneumoperitoneum. CT is recommended for further evaluation.  Critical Value/emergent results were called by telephone at the time of interpretation on 11/25/2014 at 3:20 am to Dr. Veatrice Kells , who verbally acknowledged these results.   Electronically Signed   By: Anner Crete M.D.   On: 11/25/2014 03:20    Anti-infectives: Anti-infectives    Start     Dose/Rate Route Frequency Ordered Stop   11/26/14 0900  fluconazole (DIFLUCAN) IVPB 400 mg    Comments:  Pharm to confirm dosing; intra-abdominal infection   400 mg 100 mL/hr over 120 Minutes Intravenous Every 24 hours 11/26/14 0821     11/25/14 1700  vancomycin (VANCOCIN) IVPB 750 mg/150 ml premix     750 mg 150 mL/hr over 60 Minutes Intravenous Every 12 hours 11/25/14 1553     11/25/14 1300  piperacillin-tazobactam (ZOSYN) IVPB 3.375 g     3.375 g 12.5 mL/hr over 240 Minutes Intravenous 3 times per day 11/25/14 1227     11/25/14 0430  vancomycin (VANCOCIN) IVPB 1000 mg/200 mL premix     1,000 mg 200 mL/hr over 60 Minutes Intravenous  Once 11/25/14 0420 11/25/14 0638   11/25/14 0430  piperacillin-tazobactam (ZOSYN) IVPB 3.375 g     3.375 g 100 mL/hr over 30 Minutes Intravenous  Once 11/25/14 0420 11/25/14 0547      Assessment/Plan: s/p Procedure(s): LAPAROSCOPIC PRIMARY REPAIR OF PERFORATED PREPYLORIC ULCER WITH GRAHAM PATCH (N/A)  NPO xcept ice chips NG tube to LIWS Cont IV abx 2/7 + add diflucan Will plan UGI on wed  D/c foley Aggressive pulm toilet IVF have only been running at 20cc/hr  despite order for 150cc/hr. Discussed with nursing staff. Will increase to 125 cc/hr Blood sugars ok Cont chemical vte prophylaxis Discussed intra op findings with pt and wife  Leighton Ruff. Redmond Pulling, MD, FACS General, Bariatric, & Minimally Invasive Surgery [facility], [place]   LOS: 1 day    Gayland Curry 11/26/2014

## 2014-11-27 ENCOUNTER — Inpatient Hospital Stay (HOSPITAL_COMMUNITY): Payer: BLUE CROSS/BLUE SHIELD

## 2014-11-27 DIAGNOSIS — I4891 Unspecified atrial fibrillation: Secondary | ICD-10-CM

## 2014-11-27 LAB — GLUCOSE, CAPILLARY
GLUCOSE-CAPILLARY: 138 mg/dL — AB (ref 65–99)
GLUCOSE-CAPILLARY: 189 mg/dL — AB (ref 65–99)
Glucose-Capillary: 169 mg/dL — ABNORMAL HIGH (ref 65–99)
Glucose-Capillary: 159 mg/dL — ABNORMAL HIGH (ref 65–99)
Glucose-Capillary: 126 mg/dL — ABNORMAL HIGH (ref 65–99)
Glucose-Capillary: 137 mg/dL — ABNORMAL HIGH (ref 65–99)

## 2014-11-27 LAB — BASIC METABOLIC PANEL
ANION GAP: 10 (ref 5–15)
BUN: 12 mg/dL (ref 6–20)
CALCIUM: 8.8 mg/dL — AB (ref 8.9–10.3)
CHLORIDE: 100 mmol/L — AB (ref 101–111)
CO2: 24 mmol/L (ref 22–32)
Creatinine, Ser: 0.75 mg/dL (ref 0.61–1.24)
GFR calc non Af Amer: >60 mL/min (ref >60)
GLUCOSE: 155 mg/dL — AB (ref 65–99)
POTASSIUM: 4.1 mmol/L (ref 3.5–5.1)
Sodium: 134 mmol/L — ABNORMAL LOW (ref 135–145)

## 2014-11-27 LAB — TSH: TSH: 3.148 u[IU]/mL (ref 0.350–4.500)

## 2014-11-27 LAB — CBC
HEMATOCRIT: 39.4 % (ref 39.0–52.0)
HEMOGLOBIN: 13.4 g/dL (ref 13.0–17.0)
MCH: 31.9 pg (ref 26.0–34.0)
MCHC: 34 g/dL (ref 30.0–36.0)
MCV: 93.8 fL (ref 78.0–100.0)
PLATELETS: 167 10*3/uL (ref 150–400)
RBC: 4.2 MIL/uL — AB (ref 4.22–5.81)
RDW: 12.8 % (ref 11.5–15.5)
WBC: 12.9 10*3/uL — AB (ref 4.0–10.5)

## 2014-11-27 LAB — HEMOGLOBIN A1C
HEMOGLOBIN A1C: 7.2 % — AB (ref 4.8–5.6)
Mean Plasma Glucose: 160 mg/dL

## 2014-11-27 LAB — MAGNESIUM: Magnesium: 1.5 mg/dL — ABNORMAL LOW (ref 1.7–2.4)

## 2014-11-27 MED ORDER — ESMOLOL HCL-SODIUM CHLORIDE 2000 MG/100ML IV SOLN
25.0000 ug/kg/min | INTRAVENOUS | Status: DC
  Administered 2014-11-27: 25 ug/kg/min via INTRAVENOUS
  Filled 2014-11-27: qty 100

## 2014-11-27 MED ORDER — MAGNESIUM SULFATE 2 GM/50ML IV SOLN
2.0000 g | Freq: Once | INTRAVENOUS | Status: AC
  Administered 2014-11-27: 2 g via INTRAVENOUS
  Filled 2014-11-27: qty 50

## 2014-11-27 NOTE — Progress Notes (Signed)
Patient ID: Thomas Medici., male   DOB: 24-Apr-1955, 59 y.o.   MRN: [id number] 2 Days Post-Op  Subjective: Pt c/o some pain, but otherwise ok.  Wanting to go home.  No flatus. NGT 650cc yesterday  Objective: Vital signs in last 24 hours: Temp:  [97.8 F (36.6 C)-98.2 F (36.8 C)] 97.9 F (36.6 C) (09/20 0415) Pulse Rate:  [66-137] 81 (09/20 0700) Resp:  [14-31] 19 (09/20 0746) BP: (114-170)/(57-82) 161/70 mmHg (09/20 0700) SpO2:  [93 %-97 %] 97 % (09/20 0746) FiO2 (%):  [100 %] 100 % (09/20 0745) Last BM Date: 11/24/14  Intake/Output from previous day: 09/19 0701 - 09/20 0700 In: 3076.2 [I.V.:2551.2; IV Piggyback:525] Out: 2880 [Urine:2100; Emesis/NG output:650; Drains:130] Intake/Output this shift:    PE: Abd: soft, appropriately tender, JP with serous drainage, hypoactive BS, incisions c/d/i HEart: NSR this am Lungs: CTAB  Lab Results:   Recent Labs  11/26/14 0319 11/27/14 0308  WBC 12.0* 12.9*  HGB 12.5* 13.4  HCT 37.4* 39.4  PLT 183 167   BMET  Recent Labs  11/26/14 0319 11/27/14 0308  NA 137 134*  K 4.3 4.1  CL 104 100*  CO2 26 24  GLUCOSE 97 155*  BUN 16 12  CREATININE 1.06 0.75  CALCIUM 8.3* 8.8*   PT/INR No results for input(s): LABPROT, INR in the last 72 hours. CMP     Component Value Date/Time   NA 134* 11/27/2014 0308   NA 131* 03/27/2014 1058   K 4.1 11/27/2014 0308   CL 100* 11/27/2014 0308   CO2 24 11/27/2014 0308   GLUCOSE 155* 11/27/2014 0308   GLUCOSE 180* 03/27/2014 1058   BUN 12 11/27/2014 0308   BUN 9 03/27/2014 1058   CREATININE 0.75 11/27/2014 0308   CALCIUM 8.8* 11/27/2014 0308   PROT 5.4* 11/26/2014 0319   PROT 6.4 03/27/2014 1058   ALBUMIN 2.6* 11/26/2014 0319   AST 23 11/26/2014 0319   ALT 15* 11/26/2014 0319   ALKPHOS 44 11/26/2014 0319   BILITOT 1.0 11/26/2014 0319   GFRNONAA >60 11/27/2014 0308   GFRAA >60 11/27/2014 0308   Lipase     Component Value Date/Time   LIPASE 15* 11/25/2014 0246        Studies/Results: No results found.  Anti-infectives: Anti-infectives    Start     Dose/Rate Route Frequency Ordered Stop   11/26/14 0900  fluconazole (DIFLUCAN) IVPB 400 mg    Comments:  Pharm to confirm dosing; intra-abdominal infection   400 mg 100 mL/hr over 120 Minutes Intravenous Every 24 hours 11/26/14 0821     11/25/14 1700  vancomycin (VANCOCIN) IVPB 750 mg/150 ml premix     750 mg 150 mL/hr over 60 Minutes Intravenous Every 12 hours 11/25/14 1553     11/25/14 1300  piperacillin-tazobactam (ZOSYN) IVPB 3.375 g     3.375 g 12.5 mL/hr over 240 Minutes Intravenous 3 times per day 11/25/14 1227     11/25/14 0430  vancomycin (VANCOCIN) IVPB 1000 mg/200 mL premix     1,000 mg 200 mL/hr over 60 Minutes Intravenous  Once 11/25/14 0420 11/25/14 0638   11/25/14 0430  piperacillin-tazobactam (ZOSYN) IVPB 3.375 g     3.375 g 100 mL/hr over 30 Minutes Intravenous  Once 11/25/14 0420 11/25/14 0547       Assessment/Plan POD 2, s/p laparoscopic repair of perforated prepyloric ulcer with graham patch -cont NGT today and bowel rest.  Will likely swallow patient tomorrow -ambulate in halls TID and get  up to chair -pulm toilet -cont JP drain, nonbilious right now -Zosyn D3/7, Diflucan D2 -on protonix BID A fib -appreciate cards assistance -on esmolol drip.  Now in NSR DVT Prophylaxis -SCDs/Heparin    Dispo - in ICU until he comes off of drips that will allow him to go back to SDU or floor.  LOS: 2 days    OSBORNE,KELLY E 11/27/2014, 8:13 AM Pager: [phone]

## 2014-11-27 NOTE — Progress Notes (Signed)
  Echocardiogram 2D Echocardiogram has been performed.  Thomas Sandoval 11/27/2014, 2:59 PM

## 2014-11-27 NOTE — Care Management Note (Signed)
Case Management Note  Patient Details  Name: Thomas Sandoval. MRN: [id number] Date of Birth: 04-19-1955  Subjective/Objective:     Lives at home with wife, independent prior.  Tx to ICU with Afib, prior abd pain and had abd surgery for perforation.               Action/Plan:   Expected Discharge Date:                  Expected Discharge Plan:  Home/Self Care  In-House Referral:     Discharge planning Services  CM Consult  Post Acute Care Choice:    Choice offered to:     DME Arranged:    DME Agency:     HH Arranged:    HH Agency:     Status of Service:  In process, will continue to follow  Medicare Important Message Given:    Date Medicare IM Given:    Medicare IM give by:    Date Additional Medicare IM Given:    Additional Medicare Important Message give by:     If discussed at [place] of Stay Meetings, dates discussed:    Additional Comments:  Vergie Living, RN 11/27/2014, 2:48 PM

## 2014-11-27 NOTE — Progress Notes (Signed)
Patient Name: Thomas Sandoval. Date of Encounter: 11/27/2014  Principal Problem:   Perforated gastric ulcer Active Problems:   Atrial fibrillation with rapid ventricular response   Length of Stay: 2  SUBJECTIVE  Seems a little labile emotionally, otherwise well.  Converted to sinus rhythm since 0230h.  CURRENT MEDS . antiseptic oral rinse  7 mL Mouth Rinse BID  . fluconazole (DIFLUCAN) IV  400 mg Intravenous Q24H  . heparin subcutaneous  5,000 Units Subcutaneous 3 times per day  . insulin aspart  0-20 Units Subcutaneous 6 times per day  . mometasone-formoterol  2 puff Inhalation BID  . morphine   Intravenous 6 times per day  . pantoprazole (PROTONIX) IV  40 mg Intravenous Q12H  . piperacillin-tazobactam (ZOSYN)  IV  3.375 g Intravenous 3 times per day  . vancomycin  750 mg Intravenous Q12H    OBJECTIVE   Intake/Output Summary (Last 24 hours) at 11/27/14 1348 Last data filed at 11/27/14 1200  Gross per 24 hour  Intake 3212.22 ml  Output   2765 ml  Net 447.22 ml   Filed Weights   11/25/14 1146  Weight: 153 lb 14.1 oz (69.8 kg)    PHYSICAL EXAM Filed Vitals:   11/27/14 1000 11/27/14 1100 11/27/14 1200 11/27/14 1220  BP: 126/50 112/49 152/47   Pulse: 63 61 68   Temp:    98.7 F (37.1 C)  TempSrc:    Oral  Resp: 15 21 17    Height:      Weight:      SpO2: 96% 94% 97%    General: Alert, oriented x3, no distress Head: no evidence of trauma, PERRL, EOMI, no exophtalmos or lid lag, no myxedema, no xanthelasma; normal ears, nose and oropharynx Neck: normal jugular venous pulsations and no hepatojugular reflux; brisk carotid pulses without delay and no carotid bruits Chest: clear to auscultation, no signs of consolidation by percussion or palpation, normal fremitus, symmetrical and full respiratory excursions Cardiovascular: normal position and quality of the apical impulse, regular rhythm, normal first and second heart sounds, no rubs or gallops, no  murmur Abdomen: no tenderness or distention, no masses by palpation, no abnormal pulsatility or arterial bruits, normal bowel sounds, no hepatosplenomegaly Extremities: no clubbing, cyanosis or edema; 2+ radial, ulnar and brachial pulses bilaterally; 2+ right femoral, posterior tibial and dorsalis pedis pulses; 2+ left femoral, posterior tibial and dorsalis pedis pulses; no subclavian or femoral bruits Neurological: grossly nonfocal  LABS  CBC  Recent Labs  11/25/14 0246  11/26/14 0319 11/27/14 0308  WBC 12.2*  --  12.0* 12.9*  NEUTROABS 10.9*  --   --   --   HGB 14.6  < > 12.5* 13.4  HCT 41.9  < > 37.4* 39.4  MCV 92.3  --  95.4 93.8  PLT 218  --  183 167  < > = values in this interval not displayed. Basic Metabolic Panel  Recent Labs  11/26/14 0319 11/27/14 0308  NA 137 134*  K 4.3 4.1  CL 104 100*  CO2 26 24  GLUCOSE 97 155*  BUN 16 12  CREATININE 1.06 0.75  CALCIUM 8.3* 8.8*  MG  --  1.5*   Liver Function Tests  Recent Labs  11/26/14 0319  AST 23  ALT 15*  ALKPHOS 44  BILITOT 1.0  PROT 5.4*  ALBUMIN 2.6*    Recent Labs  11/25/14 0246  LIPASE 15*   Cardiac Enzymes No results for input(s): CKTOTAL, CKMB, CKMBINDEX, TROPONINI in the  last 72 hours. BNP Invalid input(s): POCBNP D-Dimer No results for input(s): DDIMER in the last 72 hours. Hemoglobin A1C  Recent Labs  11/26/14 0319  HGBA1C 7.2*   Fasting Lipid Panel No results for input(s): CHOL, HDL, LDLCALC, TRIG, CHOLHDL, LDLDIRECT in the last 72 hours. Thyroid Function Tests  Recent Labs  11/27/14 0308  TSH 3.148    Radiology Studies Imaging results have been reviewed and No results found.  TELE Now NSR with occasional PAcs  ECG atrial fibrillation with RVR, otherwise normal  ASSESSMENT AND PLAN  atrial fibrillation with RVR during acute critical illness, self resolved He has had atrial fibrillation before. Embolic risk is intermediate - he bears a diagnosis of HTN, but is  not on meds for this. He has DM and PAD. In my calculation, CHADSVasc score is 2, CHADS score is 1. Check echo. Would not start anticoagulation now, but likely should be on NOAC long term. Discuss as outpatient.   Sanda Klein, MD, [facility] [phone] office [phone] pager 11/27/2014 1:48 PM

## 2014-11-28 ENCOUNTER — Other Ambulatory Visit: Payer: Self-pay | Admitting: Nurse Practitioner

## 2014-11-28 LAB — CBC
HEMATOCRIT: 36.1 % — AB (ref 39.0–52.0)
HEMOGLOBIN: 12.3 g/dL — AB (ref 13.0–17.0)
MCH: 31.1 pg (ref 26.0–34.0)
MCHC: 34.1 g/dL (ref 30.0–36.0)
MCV: 91.4 fL (ref 78.0–100.0)
Platelets: 204 10*3/uL (ref 150–400)
RBC: 3.95 MIL/uL — AB (ref 4.22–5.81)
RDW: 12.4 % (ref 11.5–15.5)
WBC: 10 10*3/uL (ref 4.0–10.5)

## 2014-11-28 LAB — BASIC METABOLIC PANEL
ANION GAP: 11 (ref 5–15)
BUN: 12 mg/dL (ref 6–20)
CHLORIDE: 98 mmol/L — AB (ref 101–111)
CO2: 23 mmol/L (ref 22–32)
CREATININE: 0.85 mg/dL (ref 0.61–1.24)
Calcium: 8.1 mg/dL — ABNORMAL LOW (ref 8.9–10.3)
GFR calc non Af Amer: >60 mL/min (ref >60)
Glucose, Bld: 163 mg/dL — ABNORMAL HIGH (ref 65–99)
POTASSIUM: 3.6 mmol/L (ref 3.5–5.1)
SODIUM: 132 mmol/L — AB (ref 135–145)

## 2014-11-28 LAB — GLUCOSE, CAPILLARY
Glucose-Capillary: 108 mg/dL — ABNORMAL HIGH (ref 65–99)
Glucose-Capillary: 138 mg/dL — ABNORMAL HIGH (ref 65–99)
Glucose-Capillary: 147 mg/dL — ABNORMAL HIGH (ref 65–99)
Glucose-Capillary: 150 mg/dL — ABNORMAL HIGH (ref 65–99)
Glucose-Capillary: 166 mg/dL — ABNORMAL HIGH (ref 65–99)
Glucose-Capillary: 181 mg/dL — ABNORMAL HIGH (ref 65–99)

## 2014-11-28 LAB — MAGNESIUM: Magnesium: 1.6 mg/dL — ABNORMAL LOW (ref 1.7–2.4)

## 2014-11-28 MED ORDER — METHOCARBAMOL 1000 MG/10ML IJ SOLN
1000.0000 mg | Freq: Three times a day (TID) | INTRAVENOUS | Status: DC | PRN
  Filled 2014-11-28: qty 10

## 2014-11-28 MED ORDER — FENTANYL CITRATE (PF) 100 MCG/2ML IJ SOLN
12.5000 ug | INTRAMUSCULAR | Status: DC | PRN
  Administered 2014-11-29 – 2014-11-30 (×4): 50 ug via INTRAVENOUS
  Filled 2014-11-28 (×4): qty 2

## 2014-11-28 MED ORDER — ACETAMINOPHEN 10 MG/ML IV SOLN
1000.0000 mg | Freq: Four times a day (QID) | INTRAVENOUS | Status: AC
  Administered 2014-11-28 – 2014-11-29 (×4): 1000 mg via INTRAVENOUS
  Filled 2014-11-28 (×4): qty 100

## 2014-11-28 NOTE — Progress Notes (Signed)
Noted that patient's cardiac rhythm has converted into A-Fib with a rate of 130-140 BPM. Dr. Marlowe Sax notified. Ordered to restart Esmolol at previous infusion rate of 44mcg/kg/Hr. Esmolol has been restarted. Patient will continue to be monitored.

## 2014-11-28 NOTE — Progress Notes (Signed)
Morphine PCA stopped per surgery PA. Will order prn Fentanyl.

## 2014-11-28 NOTE — Progress Notes (Signed)
[facility] Progress Note  3 Days Post-Op  Subjective: Doing okay, asking for minimal pain meds.  Nausea with pain meds which is why he's avoiding it.  No current N/V.  Tolerating ice chips.  NG output 550mL/24hr.  Only one episode of flatus so far.  Up OOB with nursing staff.  Wants to get rid of PCA and maybe try another pain med.    Objective: Vital signs in last 24 hours: Temp:  [97.5 F (36.4 C)-98.5 F (36.9 C)] 98.2 F (36.8 C) (09/21 1205) Pulse Rate:  [62-143] 73 (09/21 1100) Resp:  [14-25] 20 (09/21 1200) BP: (77-176)/(43-93) 150/58 mmHg (09/21 1100) SpO2:  [92 %-98 %] 96 % (09/21 1200) FiO2 (%):  [100 %] 100 % (09/21 1200) Last BM Date: 11/23/14  Intake/Output from previous day: 09/20 0701 - 09/21 0700 In: 2682.1 [I.V.:2317.1; IV Piggyback:225] Out: 4481 [Urine:1825; Emesis/NG output:500; Drains:90] Intake/Output this shift: Total I/O In: 155.8 [I.V.:145.8; Other:10] Out: 46 [Urine:375]  PE: Gen:  Alert, NAD, pleasant Card:  RRR, no M/G/R heard Pulm:  CTA, no W/R/R Abd: Soft, mild distension, tender to palpation over lap sites, diminished BS, no HSM, incisions C/D/I, drain with serous drainage   Lab Results:   Recent Labs  11/27/14 0308 11/28/14 0229  WBC 12.9* 10.0  HGB 13.4 12.3*  HCT 39.4 36.1*  PLT 167 204   BMET  Recent Labs  11/27/14 0308 11/28/14 0229  NA 134* 132*  K 4.1 3.6  CL 100* 98*  CO2 24 23  GLUCOSE 155* 163*  BUN 12 12  CREATININE 0.75 0.85  CALCIUM 8.8* 8.1*   PT/INR No results for input(s): LABPROT, INR in the last 72 hours. CMP     Component Value Date/Time   NA 132* 11/28/2014 0229   NA 131* 03/27/2014 1058   K 3.6 11/28/2014 0229   CL 98* 11/28/2014 0229   CO2 23 11/28/2014 0229   GLUCOSE 163* 11/28/2014 0229   GLUCOSE 180* 03/27/2014 1058   BUN 12 11/28/2014 0229   BUN 9 03/27/2014 1058   CREATININE 0.85 11/28/2014 0229   CALCIUM 8.1* 11/28/2014 0229   PROT 5.4* 11/26/2014 0319   PROT 6.4  03/27/2014 1058   ALBUMIN 2.6* 11/26/2014 0319   AST 23 11/26/2014 0319   ALT 15* 11/26/2014 0319   ALKPHOS 44 11/26/2014 0319   BILITOT 1.0 11/26/2014 0319   GFRNONAA >60 11/28/2014 0229   GFRAA >60 11/28/2014 0229   Lipase     Component Value Date/Time   LIPASE 15* 11/25/2014 0246       Studies/Results: No results found.  Anti-infectives: Anti-infectives    Start     Dose/Rate Route Frequency Ordered Stop   11/26/14 0900  fluconazole (DIFLUCAN) IVPB 400 mg    Comments:  Pharm to confirm dosing; intra-abdominal infection   400 mg 100 mL/hr over 120 Minutes Intravenous Every 24 hours 11/26/14 0821     11/25/14 1700  vancomycin (VANCOCIN) IVPB 750 mg/150 ml premix  Status:  Discontinued     750 mg 150 mL/hr over 60 Minutes Intravenous Every 12 hours 11/25/14 1553 11/27/14 1518   11/25/14 1300  piperacillin-tazobactam (ZOSYN) IVPB 3.375 g     3.375 g 12.5 mL/hr over 240 Minutes Intravenous 3 times per day 11/25/14 1227     11/25/14 0430  vancomycin (VANCOCIN) IVPB 1000 mg/200 mL premix     1,000 mg 200 mL/hr over 60 Minutes Intravenous  Once 11/25/14 0420 11/25/14 0638   11/25/14 0430  piperacillin-tazobactam (ZOSYN) IVPB 3.375 g     3.375 g 100 mL/hr over 30 Minutes Intravenous  Once 11/25/14 0420 11/25/14 0547       Assessment/Plan POD #3, s/p laparoscopic repair of perforated prepyloric ulcer with graham patch -Cont NGT today and bowel rest. Not much bowel function yet.  Will likely swallow patient tomorrow -Ambulate in halls TID and get up to chair -Pulm toilet -Cont JP drain, nonbilious right now -Zosyn D3/7, Diflucan D2 -On protonix BID -D/c pca, start IVP fentanyl due to nausea, ice, robaxin, IV tylenol -Transfer to 6N tele A fib -Appreciate cards assistance -NSR, now on orals, esmolol discontinued DVT Prophylaxis -SCDs/Heparin Disp -Discharge to floor    LOS: 3 days    Nat Christen 11/28/2014, 12:29 PM Pager: [phone]

## 2014-11-28 NOTE — Progress Notes (Signed)
Noted that patient's SBP has dropped into the lower 90's with MAPs ranging from 55-62. Dr. Marlowe Sax notified. Ordered to continue with continuous Esmolol infusion and instructed that SBP in the 90's is "ok". Patient will continue to be monitored.

## 2014-11-28 NOTE — Progress Notes (Signed)
ANTIBIOTIC CONSULT NOTE - F/U  Pharmacy Consult for Zosyn Indication: perforated gastric ulcer  Allergies  Allergen Reactions  . Zithromax [Azithromycin] Other (See Comments)    "Severe stomach cramps; told to list as an allergy by dr. Years ago"  . Avelox [Moxifloxacin Hcl In Nacl] Other (See Comments)    Hemolysis  In 2012  . Bactrim [Sulfamethoxazole-Trimethoprim] Diarrhea and Nausea And Vomiting    Patient Measurements: Height: 5\' 9"  (175.3 cm) Weight: 153 lb 14.1 oz (69.8 kg) IBW/kg (Calculated) : 70.7 Last weight 71.5 kg  Vital Signs: Temp: 98.2 F (36.8 C) (09/21 1205) Temp Source: Oral (09/21 1205) BP: 150/58 mmHg (09/21 1100) Pulse Rate: 73 (09/21 1100) Intake/Output from previous day: 09/20 0701 - 09/21 0700 In: 2682.1 [I.V.:2317.1; IV Piggyback:225] Out: 8938 [Urine:1825; Emesis/NG output:500; Drains:90] Intake/Output from this shift: Total I/O In: 155.8 [I.V.:145.8; Other:10] Out: 375 [Urine:375]  Labs:  Recent Labs  11/26/14 0319 11/27/14 0308 11/28/14 0229  WBC 12.0* 12.9* 10.0  HGB 12.5* 13.4 12.3*  PLT 183 167 204  CREATININE 1.06 0.75 0.85   Estimated Creatinine Clearance: 94.7 mL/min (by C-G formula based on Cr of 0.85). No results for input(s): VANCOTROUGH, VANCOPEAK, VANCORANDOM, GENTTROUGH, GENTPEAK, GENTRANDOM, TOBRATROUGH, TOBRAPEAK, TOBRARND, AMIKACINPEAK, AMIKACINTROU, AMIKACIN in the last 72 hours.   Microbiology: Recent Results (from the past 720 hour(s))  Blood culture (routine x 2)     Status: None (Preliminary result)   Collection Time: 11/25/14  4:26 AM  Result Value Ref Range Status   Specimen Description BLOOD RIGHT ARM  Final   Special Requests BOTTLES DRAWN AEROBIC AND ANAEROBIC 5CC  Final   Culture NO GROWTH 2 DAYS  Final   Report Status PENDING  Incomplete  Blood culture (routine x 2)     Status: None (Preliminary result)   Collection Time: 11/25/14  4:32 AM  Result Value Ref Range Status   Specimen Description BLOOD  LEFT HAND  Final   Special Requests BOTTLES DRAWN AEROBIC AND ANAEROBIC 5CC  Final   Culture NO GROWTH 2 DAYS  Final   Report Status PENDING  Incomplete  MRSA PCR Screening     Status: None   Collection Time: 11/25/14  5:15 PM  Result Value Ref Range Status   MRSA by PCR NEGATIVE NEGATIVE Final    Comment:        The GeneXpert MRSA Assay (FDA approved for NASAL specimens only), is one component of a comprehensive MRSA colonization surveillance program. It is not intended to diagnose MRSA infection nor to guide or monitor treatment for MRSA infections.     Medical History: Past Medical History  Diagnosis Date  . GERD (gastroesophageal reflux disease)   . Type 1 diabetes mellitus 1977  . History of hemolytic anemia 02/2011    secondary to Avelox  . History of atrial flutter 02/2011    Converted to NSR with Cardizem  . COPD (chronic obstructive pulmonary disease)   . History of chronic bronchitis   . Diabetic retinopathy of both eyes   . Smokers' cough   . Productive cough   . Arthritis   . DDD (degenerative disc disease)   . HOH (hard of hearing)   . Chronic back pain   . Bladder cancer   . HOH (hard of hearing)     no eardrum and nerve damage on R, also HOH on L  . PAD (peripheral artery disease) 04/2014    Dr Trula Slade; bilateral SFA occlusion, R mid, L distal    Medications:  Anti-infectives    Start     Dose/Rate Route Frequency Ordered Stop   11/26/14 0900  fluconazole (DIFLUCAN) IVPB 400 mg    Comments:  Pharm to confirm dosing; intra-abdominal infection   400 mg 100 mL/hr over 120 Minutes Intravenous Every 24 hours 11/26/14 0821     11/25/14 1700  vancomycin (VANCOCIN) IVPB 750 mg/150 ml premix  Status:  Discontinued     750 mg 150 mL/hr over 60 Minutes Intravenous Every 12 hours 11/25/14 1553 11/27/14 1518   11/25/14 1300  piperacillin-tazobactam (ZOSYN) IVPB 3.375 g     3.375 g 12.5 mL/hr over 240 Minutes Intravenous 3 times per day 11/25/14 1227      11/25/14 0430  vancomycin (VANCOCIN) IVPB 1000 mg/200 mL premix     1,000 mg 200 mL/hr over 60 Minutes Intravenous  Once 11/25/14 0420 11/25/14 0638   11/25/14 0430  piperacillin-tazobactam (ZOSYN) IVPB 3.375 g     3.375 g 100 mL/hr over 30 Minutes Intravenous  Once 11/25/14 0420 11/25/14 0547     Assessment: 59 year old male admitted with perforated gastric ulcer and peritonitis.  He is s/p laparoscopic repair and is to continue antibiotic therapy with Vancomycin and Zosyn.  Note his SCr was up to 1.7 today on point of care testing in the ED.  This is known to not be accurate for SCr and I will reassess his renal function before making dose determinations.  ID: peritonitis from perf gastric ulcer; WBC 12.9, AF   Vanc 9/18>9/20  Zosyn 9/18>  Fluconazole 9/19>   9/18 Blood>NGTD  MRSA PCR Neg  Plan:  Zosyn 3.375gm IV q8h extended infusion  Levester Fresh, PharmD, BCPS Clinical Pharmacist Pager [phone] 11/28/2014 12:32 PM

## 2014-11-29 ENCOUNTER — Inpatient Hospital Stay (HOSPITAL_COMMUNITY): Payer: BLUE CROSS/BLUE SHIELD

## 2014-11-29 LAB — GLUCOSE, CAPILLARY
GLUCOSE-CAPILLARY: 167 mg/dL — AB (ref 65–99)
GLUCOSE-CAPILLARY: 139 mg/dL — AB (ref 65–99)
Glucose-Capillary: 161 mg/dL — ABNORMAL HIGH (ref 65–99)
Glucose-Capillary: 166 mg/dL — ABNORMAL HIGH (ref 65–99)
Glucose-Capillary: 170 mg/dL — ABNORMAL HIGH (ref 65–99)
Glucose-Capillary: 199 mg/dL — ABNORMAL HIGH (ref 65–99)
Glucose-Capillary: 226 mg/dL — ABNORMAL HIGH (ref 65–99)

## 2014-11-29 MED ORDER — IOHEXOL 300 MG/ML  SOLN
150.0000 mL | Freq: Once | INTRAMUSCULAR | Status: DC | PRN
  Administered 2014-11-29: 150 mL via ORAL
  Filled 2014-11-29: qty 150

## 2014-11-29 NOTE — Progress Notes (Signed)
Cardiology signed off 2 days ago, paged by nurse as speech therapy want to do swallow study. Per nurse, pt has maintained sinus rhythm Since he is still in NSR, he may come off telemetry temporarily for swallow study  Hilbert Corrigan [place] Pager: [phone]

## 2014-11-29 NOTE — Progress Notes (Signed)
4 Days Post-Op  Subjective: No c/o. +flatus. NG is irritating.   Objective: Vital signs in last 24 hours: Temp:  [97.5 F (36.4 C)-98.2 F (36.8 C)] 98.1 F (36.7 C) (09/22 0408) Pulse Rate:  [61-90] 73 (09/22 0700) Resp:  [14-25] 19 (09/22 0700) BP: (119-170)/(47-77) 141/62 mmHg (09/22 0700) SpO2:  [92 %-100 %] 97 % (09/22 0700) FiO2 (%):  [100 %] 100 % (09/21 1500) Last BM Date: 11/23/14  Intake/Output from previous day: 09/21 0701 - 09/22 0700 In: 1335.8 [I.V.:770.8; NG/GT:10; IV Piggyback:525] Out: 3335 [Urine:1815; Emesis/NG output:550; Drains:90] Intake/Output this shift:    Alert, nontoxic Cta, upper airway cough IS 1800 Reg Soft, mild distension, min TTP, JP - serous No edema, +SCDs  Lab Results:   Recent Labs  11/27/14 0308 11/28/14 0229  WBC 12.9* 10.0  HGB 13.4 12.3*  HCT 39.4 36.1*  PLT 167 204   BMET  Recent Labs  11/27/14 0308 11/28/14 0229  NA 134* 132*  K 4.1 3.6  CL 100* 98*  CO2 24 23  GLUCOSE 155* 163*  BUN 12 12  CREATININE 0.75 0.85  CALCIUM 8.8* 8.1*   PT/INR No results for input(s): LABPROT, INR in the last 72 hours. ABG No results for input(s): PHART, HCO3 in the last 72 hours.  Invalid input(s): PCO2, PO2  Studies/Results: No results found.  Anti-infectives: Anti-infectives    Start     Dose/Rate Route Frequency Ordered Stop   11/26/14 0900  fluconazole (DIFLUCAN) IVPB 400 mg    Comments:  Pharm to confirm dosing; intra-abdominal infection   400 mg 100 mL/hr over 120 Minutes Intravenous Every 24 hours 11/26/14 0821     11/25/14 1700  vancomycin (VANCOCIN) IVPB 750 mg/150 ml premix  Status:  Discontinued     750 mg 150 mL/hr over 60 Minutes Intravenous Every 12 hours 11/25/14 1553 11/27/14 1518   11/25/14 1300  piperacillin-tazobactam (ZOSYN) IVPB 3.375 g     3.375 g 12.5 mL/hr over 240 Minutes Intravenous 3 times per day 11/25/14 1227     11/25/14 0430  vancomycin (VANCOCIN) IVPB 1000 mg/200 mL premix     1,000  mg 200 mL/hr over 60 Minutes Intravenous  Once 11/25/14 0420 11/25/14 0638   11/25/14 0430  piperacillin-tazobactam (ZOSYN) IVPB 3.375 g     3.375 g 100 mL/hr over 30 Minutes Intravenous  Once 11/25/14 0420 11/25/14 0547      Assessment/Plan: s/p Procedure(s): LAPAROSCOPIC PRIMARY REPAIR OF PERFORATED PREPYLORIC ULCER WITH GRAHAM PATCH (N/A) Cont bid protonix Cont IV abx UGI this am, if ok will remove NG and let have some clears  Afib - back in sinus. Cards following. Check BMET, Mag in am VTE prophylaxis - scds, heparin DM - blood sugars ok  tx to floor with monitoring Ambulate  Leighton Ruff. Redmond Pulling, MD, FACS General, Bariatric, & Minimally Invasive Surgery [facility], [place]   LOS: 4 days    Gayland Curry 11/29/2014

## 2014-11-30 ENCOUNTER — Other Ambulatory Visit: Payer: Self-pay

## 2014-11-30 LAB — CULTURE, BLOOD (ROUTINE X 2)
CULTURE: NO GROWTH
Culture: NO GROWTH

## 2014-11-30 LAB — BASIC METABOLIC PANEL
Anion gap: 9 (ref 5–15)
BUN: 7 mg/dL (ref 6–20)
CALCIUM: 8.2 mg/dL — AB (ref 8.9–10.3)
CO2: 23 mmol/L (ref 22–32)
Chloride: 99 mmol/L — ABNORMAL LOW (ref 101–111)
Creatinine, Ser: 0.79 mg/dL (ref 0.61–1.24)
GFR calc Af Amer: >60 mL/min (ref >60)
Glucose, Bld: 138 mg/dL — ABNORMAL HIGH (ref 65–99)
POTASSIUM: 3.9 mmol/L (ref 3.5–5.1)
SODIUM: 131 mmol/L — AB (ref 135–145)

## 2014-11-30 LAB — MAGNESIUM: MAGNESIUM: 1.4 mg/dL — AB (ref 1.7–2.4)

## 2014-11-30 LAB — GLUCOSE, CAPILLARY
GLUCOSE-CAPILLARY: 227 mg/dL — AB (ref 65–99)
Glucose-Capillary: 174 mg/dL — ABNORMAL HIGH (ref 65–99)
Glucose-Capillary: 225 mg/dL — ABNORMAL HIGH (ref 65–99)
Glucose-Capillary: 278 mg/dL — ABNORMAL HIGH (ref 65–99)
Glucose-Capillary: 292 mg/dL — ABNORMAL HIGH (ref 65–99)
Glucose-Capillary: 348 mg/dL — ABNORMAL HIGH (ref 65–99)

## 2014-11-30 MED ORDER — HEPARIN (PORCINE) IN NACL 100-0.45 UNIT/ML-% IJ SOLN
1200.0000 [IU]/h | INTRAMUSCULAR | Status: DC
  Administered 2014-11-30: 1050 [IU]/h via INTRAVENOUS
  Filled 2014-11-30 (×2): qty 250

## 2014-11-30 MED ORDER — OXYCODONE-ACETAMINOPHEN 5-325 MG PO TABS
1.0000 | ORAL_TABLET | ORAL | Status: DC | PRN
  Administered 2014-12-01: 2 via ORAL
  Filled 2014-11-30: qty 2

## 2014-11-30 MED ORDER — INSULIN GLARGINE 100 UNIT/ML ~~LOC~~ SOLN
20.0000 [IU] | SUBCUTANEOUS | Status: DC
Start: 2014-11-30 — End: 2014-12-01
  Administered 2014-11-30: 20 [IU] via SUBCUTANEOUS
  Filled 2014-11-30 (×2): qty 0.2

## 2014-11-30 MED ORDER — FENTANYL CITRATE (PF) 100 MCG/2ML IJ SOLN
12.5000 ug | INTRAMUSCULAR | Status: DC | PRN
  Administered 2014-12-01: 50 ug via INTRAVENOUS
  Filled 2014-11-30: qty 2

## 2014-11-30 MED ORDER — INSULIN ASPART 100 UNIT/ML ~~LOC~~ SOLN
0.0000 [IU] | Freq: Three times a day (TID) | SUBCUTANEOUS | Status: DC
  Administered 2014-11-30: 5 [IU] via SUBCUTANEOUS
  Administered 2014-11-30 – 2014-12-01 (×2): 8 [IU] via SUBCUTANEOUS

## 2014-11-30 MED ORDER — MAGNESIUM SULFATE 2 GM/50ML IV SOLN
2.0000 g | Freq: Once | INTRAVENOUS | Status: AC
  Administered 2014-11-30: 2 g via INTRAVENOUS
  Filled 2014-11-30: qty 50

## 2014-11-30 MED ORDER — METOPROLOL TARTRATE 50 MG PO TABS
50.0000 mg | ORAL_TABLET | Freq: Two times a day (BID) | ORAL | Status: DC
  Administered 2014-11-30: 50 mg via ORAL
  Filled 2014-11-30: qty 1

## 2014-11-30 NOTE — Progress Notes (Signed)
Subjective: No chest pain  Objective: Vital signs in last 24 hours: Temp:  [98.3 F (36.8 C)-98.6 F (37 C)] 98.6 F (37 C) (09/23 1319) Pulse Rate:  [62-125] 125 (09/23 1611) Resp:  [18-19] 18 (09/23 1319) BP: (140-165)/(52-63) 141/63 mmHg (09/23 1611) SpO2:  [92 %-99 %] 99 % (09/23 1319) Weight change:  Last BM Date: 11/30/14 Intake/Output from previous day: +1728 09/22 0701 - 09/23 0700 In: 3668.8 [P.O.:450; I.V.:2918.8; IV Piggyback:300] Out: 2065 [Urine:1950; Emesis/NG output:20; Drains:95] Intake/Output this shift: Total I/O In: 22 [P.O.:180; IV Piggyback:250] Out: 0   PE:  Per MD General:Pleasant affect, NAD Skin:Warm and dry, brisk capillary refill HEENT:normocephalic, sclera clear, mucus membranes moist Heart:S1S2 irreg irreg without murmur, gallup, rub or click Lungs:clear without rales, rhonchi, or wheezes ION:GEXB, non tender, + BS, do not palpate liver spleen or masses Ext:no lower ext edema, 2+ pedal pulses, 2+ radial pulses Neuro:alert and oriented, MAE, follows commands, + facial symmetry Tele:  afib rate 100  Lab Results:  Recent Labs  11/28/14 0229  WBC 10.0  HGB 12.3*  HCT 36.1*  PLT 204   BMET  Recent Labs  11/28/14 0229 11/30/14 0355  NA 132* 131*  K 3.6 3.9  CL 98* 99*  CO2 23 23  GLUCOSE 163* 138*  BUN 12 7  CREATININE 0.85 0.79  CALCIUM 8.1* 8.2*   No results for input(s): TROPONINI in the last 72 hours.  Invalid input(s): CK, MB  Lab Results  Component Value Date   CHOL 110 03/27/2014   HDL 34* 03/27/2014   LDLCALC 60 03/27/2014   TRIG 80 03/27/2014   CHOLHDL 3.2 03/27/2014   Lab Results  Component Value Date   HGBA1C 7.2* 11/26/2014     Lab Results  Component Value Date   TSH 3.148 11/27/2014    Hepatic Function Panel No results for input(s): PROT, ALBUMIN, AST, ALT, ALKPHOS, BILITOT, BILIDIR, IBILI in the last 72 hours. No results for input(s): CHOL in the last 72 hours. No results for  input(s): PROTIME in the last 72 hours.     Studies/Results: Dg Ugi W/water Sol Cm  11/29/2014   CLINICAL DATA:  Postop repair of perforated gastric ulcer.  EXAM: WATER SOLUBLE UPPER GI SERIES  TECHNIQUE: Single-column upper GI series was performed using water soluble contrast.  CONTRAST:  162mL OMNIPAQUE IOHEXOL 300 MG/ML  SOLN  COMPARISON:  CT 11/25/2014.  FLUOROSCOPY TIME:  Radiation Exposure Index (as provided by the fluoroscopic device):  If the device does not provide the exposure index:  Fluoroscopy Time (in minutes and seconds):  28 seconds  Number of Acquired Images: 1 scout image. Remainder of examination obtained with fluoro store.  FINDINGS: The scout abdominal radiograph demonstrates a nasogastric tube with its tip in the mid stomach. There is a surgical drain in the right upper quadrant of the abdomen. There are degenerative changes throughout the lumbar spine associated with a convex right scoliosis.  150 ml Omnipaque 300 was injected via the nasogastric tube. Intermittent fluoroscopy was performed. In the supine position, the contrast pools in the gastric fundus. The patient was turned into the right lateral decubitus position. There is adequate drainage into the duodenum. The patient was returned to the supine and LPO positions. The duodenum appears normal. There is no evidence of leak.  IMPRESSION: Interval repair of perforated gastric ulcer.  No evidence of leak.   Electronically Signed   By: Richardean Sale M.D.   On: 11/29/2014  08:45    Medications: I have reviewed the patient's current medications. Scheduled Meds: . antiseptic oral rinse  7 mL Mouth Rinse BID  . fluconazole (DIFLUCAN) IV  400 mg Intravenous Q24H  . heparin subcutaneous  5,000 Units Subcutaneous 3 times per day  . insulin aspart  0-15 Units Subcutaneous TID WC  . insulin glargine  20 Units Subcutaneous Q24H  . mometasone-formoterol  2 puff Inhalation BID  . pantoprazole (PROTONIX) IV  40 mg Intravenous Q12H  .  piperacillin-tazobactam (ZOSYN)  IV  3.375 g Intravenous 3 times per day   Continuous Infusions: . 0.45 % NaCl with KCl 20 mEq / L 50 mL/hr at 11/30/14 1054   PRN Meds:.albuterol, fentaNYL (SUBLIMAZE) injection, iohexol, methocarbamol (ROBAXIN)  IV, metoprolol, ondansetron **OR** ondansetron (ZOFRAN) IV, oxyCODONE-acetaminophen  Assessment/Plan: Principal Problem:   Perforated gastric ulcer Active Problems:   Atrial fibrillation with rapid ventricular response- now with recurrent a fib, will place on pl BB --BP stable. And add IV heparin per pharmacy and discharge on Eliquis.  Will arrange follow up with Dr. Debara Pickett or APP.       LOS: 5 days   Time spent with pt. : 15 minutes. [facility] R  Nurse Practitioner Certified Pager [phone] or after 5pm and on weekends call [phone] 11/30/2014, 4:26 PM   I have seen and examined the patient along with [facility] R, NP.  I have reviewed the chart, notes and new data.  I agree with NP's note.  Key new complaints: he is minimally aware of the arrhythmia Key examination changes: irregular rhythm, no signs of CHF; surgical drain still in place   PLAN: Clearly, atrial fibrillation is a more prevalent arrhythmia for him, occuring outside the critical phase of acute illness. He needs lifelong oral anticoagulation, but will delay starting this until his surgical drain is removed (at discharge) Discussed warfarin versus NOAC and I definitely believe the latter is a better choice from him. The only problem is that he has a very low cap on Rx dug insurance coverage ($2000/year). Will give him a coupon for 30 days free drug and encouraged him to seek a plan with better coverage during the upcoming free enrollment period. Start metoprolol for rate control. He does not need a true antiarrhythmic as his symptoms attributable to atrial fibrillation are minimal.  Sanda Klein, MD, [place] [phone] 11/30/2014, 5:27 PM

## 2014-11-30 NOTE — Progress Notes (Signed)
Patient ID: Thomas Medici., male   DOB: 1955-07-14, 59 y.o.   MRN: [id number]     [facility]      [address] [place], [place] [address]    Phone: [phone] FAX: [phone]     Subjective: Sore, tolerating clears.  Some nausea.  Having BMs.  Walking in hallways.  Afebrile.  Mg low.    Objective:  Vital signs:  Filed Vitals:   11/29/14 1300 11/29/14 2043 11/29/14 2202 11/30/14 0540  BP: 153/58 165/58  156/58  Pulse: 64 62  62  Temp: 98.2 F (36.8 C) 98.6 F (37 C)  98.3 F (36.8 C)  TempSrc: Oral Oral  Oral  Resp: _0 Height:      Weight:      SpO2: 95% 97% 96% 92%    Last BM Date: 11/29/14  Intake/Output   Yesterday:  09/22 0701 - 09/23 0700 In: 7322.0 [P.O.:450; I.V.:2918.8; IV Piggyback:300] Out: 2065 [Urine:1950; Emesis/NG output:20; Drains:95] This shift: I/O last 3 completed shifts: In: 4598.8 [P.O.:450; I.V.:3543.8; Other:20; NG/GT:10; IV Piggyback:575] Out: 2542 [HCWCB:7628; Emesis/NG output:320; Drains:135]   Physical Exam: General: Pt awake/alert/oriented x4 in no acute distress Chest: cta. No chest wall pain w good excursion CV:  Pulses intact.  Regular rhythm MS: Normal AROM mjr joints.  No obvious deformity Abdomen: Soft.  Nondistended.   Mildly tender at incisions only, dressings removed.  JP drain with serous output. No evidence of peritonitis.  No incarcerated hernias. Ext:  SCDs BLE.  No mjr edema.  No cyanosis Skin: No petechiae / purpura   Problem List:   Principal Problem:   Perforated gastric ulcer Active Problems:   Atrial fibrillation with rapid ventricular response    Results:   Labs: Results for orders placed or performed during the hospital encounter of 11/25/14 (from the past 48 hour(s))  Glucose, capillary     Status: Abnormal   Collection Time: 11/28/14 11:49 AM  Result Value Ref Range   Glucose-Capillary 150 (H) 65 - 99 mg/dL  Glucose, capillary      Status: Abnormal   Collection Time: 11/28/14  4:01 PM  Result Value Ref Range   Glucose-Capillary 138 (H) 65 - 99 mg/dL  Glucose, capillary     Status: Abnormal   Collection Time: 11/28/14  8:06 PM  Result Value Ref Range   Glucose-Capillary 108 (H) 65 - 99 mg/dL  Glucose, capillary     Status: Abnormal   Collection Time: 11/28/14 11:51 PM  Result Value Ref Range   Glucose-Capillary 170 (H) 65 - 99 mg/dL   Comment 1 Notify RN    Comment 2 Document in Chart   Glucose, capillary     Status: Abnormal   Collection Time: 11/29/14  4:10 AM  Result Value Ref Range   Glucose-Capillary 167 (H) 65 - 99 mg/dL   Comment 1 Notify RN    Comment 2 Document in Chart   Glucose, capillary     Status: Abnormal   Collection Time: 11/29/14  8:02 AM  Result Value Ref Range   Glucose-Capillary 139 (H) 65 - 99 mg/dL  Glucose, capillary     Status: Abnormal   Collection Time: 11/29/14 11:33 AM  Result Value Ref Range   Glucose-Capillary 166 (H) 65 - 99 mg/dL  Glucose, capillary     Status: Abnormal   Collection Time: 11/29/14  4:24 PM  Result Value Ref Range   Glucose-Capillary 199 (H) 65 -  99 mg/dL  Glucose, capillary     Status: Abnormal   Collection Time: 11/29/14  8:41 PM  Result Value Ref Range   Glucose-Capillary 226 (H) 65 - 99 mg/dL  Glucose, capillary     Status: Abnormal   Collection Time: 11/29/14 11:35 PM  Result Value Ref Range   Glucose-Capillary 161 (H) 65 - 99 mg/dL  Basic metabolic panel     Status: Abnormal   Collection Time: 11/30/14  3:55 AM  Result Value Ref Range   Sodium 131 (L) 135 - 145 mmol/L   Potassium 3.9 3.5 - 5.1 mmol/L   Chloride 99 (L) 101 - 111 mmol/L   CO2 23 22 - 32 mmol/L   Glucose, Bld 138 (H) 65 - 99 mg/dL   BUN 7 6 - 20 mg/dL   Creatinine, Ser 0.79 0.61 - 1.24 mg/dL   Calcium 8.2 (L) 8.9 - 10.3 mg/dL   GFR calc non Af Amer >60 >60 mL/min   GFR calc Af Amer >60 >60 mL/min    Comment: (NOTE) The eGFR has been calculated using the CKD EPI  equation. This calculation has not been validated in all clinical situations. eGFR's persistently <60 mL/min signify possible Chronic Kidney Disease.    Anion gap 9 5 - 15  Magnesium     Status: Abnormal   Collection Time: 11/30/14  3:55 AM  Result Value Ref Range   Magnesium 1.4 (L) 1.7 - 2.4 mg/dL  Glucose, capillary     Status: Abnormal   Collection Time: 11/30/14  5:35 AM  Result Value Ref Range   Glucose-Capillary 174 (H) 65 - 99 mg/dL  Glucose, capillary     Status: Abnormal   Collection Time: 11/30/14  7:27 AM  Result Value Ref Range   Glucose-Capillary 225 (H) 65 - 99 mg/dL    Imaging / Studies: Dg Ugi W/water Sol Cm  11/29/2014   CLINICAL DATA:  Postop repair of perforated gastric ulcer.  EXAM: WATER SOLUBLE UPPER GI SERIES  TECHNIQUE: Single-column upper GI series was performed using water soluble contrast.  CONTRAST:  178m OMNIPAQUE IOHEXOL 300 MG/ML  SOLN  COMPARISON:  CT 11/25/2014.  FLUOROSCOPY TIME:  Radiation Exposure Index (as provided by the fluoroscopic device):  If the device does not provide the exposure index:  Fluoroscopy Time (in minutes and seconds):  28 seconds  Number of Acquired Images: 1 scout image. Remainder of examination obtained with fluoro store.  FINDINGS: The scout abdominal radiograph demonstrates a nasogastric tube with its tip in the mid stomach. There is a surgical drain in the right upper quadrant of the abdomen. There are degenerative changes throughout the lumbar spine associated with a convex right scoliosis.  150 ml Omnipaque 300 was injected via the nasogastric tube. Intermittent fluoroscopy was performed. In the supine position, the contrast pools in the gastric fundus. The patient was turned into the right lateral decubitus position. There is adequate drainage into the duodenum. The patient was returned to the supine and LPO positions. The duodenum appears normal. There is no evidence of leak.  IMPRESSION: Interval repair of perforated gastric  ulcer.  No evidence of leak.   Electronically Signed   By: WRichardean SaleM.D.   On: 11/29/2014 08:45    Medications / Allergies:  Scheduled Meds: . antiseptic oral rinse  7 mL Mouth Rinse BID  . fluconazole (DIFLUCAN) IV  400 mg Intravenous Q24H  . heparin subcutaneous  5,000 Units Subcutaneous 3 times per day  . insulin aspart  0-20 Units Subcutaneous 6 times per day  . magnesium sulfate 1 - 4 g bolus IVPB  2 g Intravenous Once  . mometasone-formoterol  2 puff Inhalation BID  . pantoprazole (PROTONIX) IV  40 mg Intravenous Q12H  . piperacillin-tazobactam (ZOSYN)  IV  3.375 g Intravenous 3 times per day   Continuous Infusions: . 0.45 % NaCl with KCl 20 mEq / L 125 mL/hr at 11/30/14 0245   PRN Meds:.albuterol, fentaNYL (SUBLIMAZE) injection, iohexol, methocarbamol (ROBAXIN)  IV, metoprolol, ondansetron **OR** ondansetron (ZOFRAN) IV, oxyCODONE-acetaminophen  Antibiotics: Anti-infectives    Start     Dose/Rate Route Frequency Ordered Stop   11/26/14 0900  fluconazole (DIFLUCAN) IVPB 400 mg    Comments:  Pharm to confirm dosing; intra-abdominal infection   400 mg 100 mL/hr over 120 Minutes Intravenous Every 24 hours 11/26/14 0821     11/25/14 1700  vancomycin (VANCOCIN) IVPB 750 mg/150 ml premix  Status:  Discontinued     750 mg 150 mL/hr over 60 Minutes Intravenous Every 12 hours 11/25/14 1553 11/27/14 1518   11/25/14 1300  piperacillin-tazobactam (ZOSYN) IVPB 3.375 g     3.375 g 12.5 mL/hr over 240 Minutes Intravenous 3 times per day 11/25/14 1227     11/25/14 0430  vancomycin (VANCOCIN) IVPB 1000 mg/200 mL premix     1,000 mg 200 mL/hr over 60 Minutes Intravenous  Once 11/25/14 0420 11/25/14 0638   11/25/14 0430  piperacillin-tazobactam (ZOSYN) IVPB 3.375 g     3.375 g 100 mL/hr over 30 Minutes Intravenous  Once 11/25/14 0420 11/25/14 0547        Assessment/Plan POD #5, s/p laparoscopic repair of perforated prepyloric ulcer with graham patch---Dr. Redmond Pulling -UGI negative  for a leak.  Stable. -Ambulate in halls TID and get up to chair -Pulm toilet -Cont JP drain, nonbilious right now, but likely can be removed before discharge.  -Zosyn D5/7, Diflucan D4 -On protonix BID FEN-advance diet, cut down fluids. Hypomagnesemia-2g IV Mg run.  AM labs.   DM II-A1c 7.2(11/27/14) CBGs are elevated, resume lantus at 1/2 dose.  Can likely increase to 40units tomorrow which is his home dose if PO intake is adequate.   A fib-NSR now.  Will need OP cardiology f/u to discuss initiating NOAC, does not need any now per recs.  DVT Prophylaxis-SCDs/Heparin Disp-next 24-48h  Erby Pian, [facility] Pager [phone]) For consults and floor pages call [phone](7A-4:30P)  11/30/2014 8:43 AM

## 2014-11-30 NOTE — Progress Notes (Signed)
ANTICOAGULATION CONSULT NOTE - Initial Consult  Pharmacy Consult for Heparin Indication: atrial fibrillation  Allergies  Allergen Reactions  . Zithromax [Azithromycin] Other (See Comments)    "Severe stomach cramps; told to list as an allergy by dr. Years ago"  . Avelox [Moxifloxacin Hcl In Nacl] Other (See Comments)    Hemolysis  In 2012  . Bactrim [Sulfamethoxazole-Trimethoprim] Diarrhea and Nausea And Vomiting    Patient Measurements: Height: 5\' 9"  (175.3 cm) Weight: 153 lb 14.1 oz (69.8 kg) IBW/kg (Calculated) : 70.7   Vital Signs: Temp: 98.6 F (37 C) (09/23 1319) Temp Source: Oral (09/23 1319) BP: 141/63 mmHg (09/23 1611) Pulse Rate: 125 (09/23 1611)  Labs:  Recent Labs  11/28/14 0229 11/30/14 0355  HGB 12.3*  --   HCT 36.1*  --   PLT 204  --   CREATININE 0.85 0.79    Estimated Creatinine Clearance: 100.6 mL/min (by C-G formula based on Cr of 0.79).   Medical History: Past Medical History  Diagnosis Date  . GERD (gastroesophageal reflux disease)   . Type 1 diabetes mellitus 1977  . History of hemolytic anemia 02/2011    secondary to Avelox  . History of atrial flutter 02/2011    Converted to NSR with Cardizem  . COPD (chronic obstructive pulmonary disease)   . History of chronic bronchitis   . Diabetic retinopathy of both eyes   . Smokers' cough   . Productive cough   . Arthritis   . DDD (degenerative disc disease)   . HOH (hard of hearing)   . Chronic back pain   . Bladder cancer   . HOH (hard of hearing)     no eardrum and nerve damage on R, also HOH on L  . PAD (peripheral artery disease) 04/2014    Dr Trula Slade; bilateral SFA occlusion, R mid, L distal    Assessment: 59 year old male to begin heparin for Afib s/p repair of perforated ulcer  Plan to start NOAC when possible  Goal of Therapy:  Heparin level 0.3-0.7 units/ml Monitor platelets by anticoagulation protocol: Yes   Plan:  No heparin bolus secondary to recent surgery Begin  heparin drip at 1050 units / hr Daily heparin level, CBC  Thank you Anette Guarneri, PharmD [phone]  11/30/2014,4:44 PM

## 2014-12-01 DIAGNOSIS — K255 Chronic or unspecified gastric ulcer with perforation: Principal | ICD-10-CM

## 2014-12-01 LAB — BASIC METABOLIC PANEL
Anion gap: 10 (ref 5–15)
BUN: 8 mg/dL (ref 6–20)
CHLORIDE: 99 mmol/L — AB (ref 101–111)
CO2: 24 mmol/L (ref 22–32)
CREATININE: 0.83 mg/dL (ref 0.61–1.24)
Calcium: 8.6 mg/dL — ABNORMAL LOW (ref 8.9–10.3)
GFR calc non Af Amer: >60 mL/min (ref >60)
Glucose, Bld: 167 mg/dL — ABNORMAL HIGH (ref 65–99)
POTASSIUM: 3.8 mmol/L (ref 3.5–5.1)
Sodium: 133 mmol/L — ABNORMAL LOW (ref 135–145)

## 2014-12-01 LAB — CBC
HEMATOCRIT: 32.4 % — AB (ref 39.0–52.0)
HEMOGLOBIN: 11.4 g/dL — AB (ref 13.0–17.0)
MCH: 32.2 pg (ref 26.0–34.0)
MCHC: 35.2 g/dL (ref 30.0–36.0)
MCV: 91.5 fL (ref 78.0–100.0)
Platelets: 225 10*3/uL (ref 150–400)
RBC: 3.54 MIL/uL — ABNORMAL LOW (ref 4.22–5.81)
RDW: 12.8 % (ref 11.5–15.5)
WBC: 7.3 10*3/uL (ref 4.0–10.5)

## 2014-12-01 LAB — GLUCOSE, CAPILLARY
Glucose-Capillary: 165 mg/dL — ABNORMAL HIGH (ref 65–99)
Glucose-Capillary: 166 mg/dL — ABNORMAL HIGH (ref 65–99)
Glucose-Capillary: 282 mg/dL — ABNORMAL HIGH (ref 65–99)

## 2014-12-01 LAB — MAGNESIUM: Magnesium: 1.5 mg/dL — ABNORMAL LOW (ref 1.7–2.4)

## 2014-12-01 LAB — HEPARIN LEVEL (UNFRACTIONATED): HEPARIN UNFRACTIONATED: 0.25 [IU]/mL — AB (ref 0.30–0.70)

## 2014-12-01 MED ORDER — METOPROLOL TARTRATE 25 MG PO TABS
25.0000 mg | ORAL_TABLET | Freq: Two times a day (BID) | ORAL | Status: DC
  Administered 2014-12-01: 25 mg via ORAL
  Filled 2014-12-01: qty 1

## 2014-12-01 MED ORDER — APIXABAN 5 MG PO TABS: 5.0000 mg | ORAL_TABLET | Freq: Two times a day (BID) | ORAL | Status: DC

## 2014-12-01 MED ORDER — APIXABAN 5 MG PO TABS
5.0000 mg | ORAL_TABLET | ORAL | Status: AC
Start: 2014-12-01 — End: 2014-12-01
  Administered 2014-12-01: 5 mg via ORAL
  Filled 2014-12-01: qty 1

## 2014-12-01 MED ORDER — HYDROCODONE-ACETAMINOPHEN 5-325 MG PO TABS: 1.0000 | ORAL_TABLET | ORAL | Status: DC | PRN

## 2014-12-01 MED ORDER — METOPROLOL TARTRATE 25 MG PO TABS
25.0000 mg | ORAL_TABLET | Freq: Every day | ORAL | Status: DC
Start: 2014-12-01 — End: 2014-12-07

## 2014-12-01 MED ORDER — INSULIN ASPART 100 UNIT/ML ~~LOC~~ SOLN
0.0000 [IU] | Freq: Three times a day (TID) | SUBCUTANEOUS | Status: DC
  Administered 2014-12-01: 3 [IU] via SUBCUTANEOUS

## 2014-12-01 NOTE — Progress Notes (Signed)
6 Days Post-Op  Subjective: No complaints and wants to go home. Concerned he can't afford meds  Objective: Vital signs in last 24 hours: Temp:  [98.2 F (36.8 C)-98.6 F (37 C)] 98.6 F (37 C) (09/24 0500) Pulse Rate:  [63-135] 63 (09/24 0500) Resp:  [16-20] 16 (09/24 0500) BP: (135-142)/(52-68) 135/52 mmHg (09/24 0500) SpO2:  [93 %-99 %] 93 % (09/24 0500) Last BM Date: 11/30/14  Intake/Output from previous day: 09/23 0701 - 09/24 0700 In: 2750.2 [P.O.:900; I.V.:1500.2; IV Piggyback:350] Out: 91 [Urine:1; Drains:90] Intake/Output this shift:    Resp: clear to auscultation bilaterally Cardio: regular rate and rhythm GI: soft, minimal tenderness. drain output serous  Lab Results:   Recent Labs  12/01/14 0346  WBC 7.3  HGB 11.4*  HCT 32.4*  PLT 225   BMET  Recent Labs  11/30/14 0355 12/01/14 0346  NA 131* 133*  K 3.9 3.8  CL 99* 99*  CO2 23 24  GLUCOSE 138* 167*  BUN 7 8  CREATININE 0.79 0.83  CALCIUM 8.2* 8.6*   PT/INR No results for input(s): LABPROT, INR in the last 72 hours. ABG No results for input(s): PHART, HCO3 in the last 72 hours.  Invalid input(s): PCO2, PO2  Studies/Results: Dg Ugi W/water Sol Cm  11/29/2014   CLINICAL DATA:  Postop repair of perforated gastric ulcer.  EXAM: WATER SOLUBLE UPPER GI SERIES  TECHNIQUE: Single-column upper GI series was performed using water soluble contrast.  CONTRAST:  158mL OMNIPAQUE IOHEXOL 300 MG/ML  SOLN  COMPARISON:  CT 11/25/2014.  FLUOROSCOPY TIME:  Radiation Exposure Index (as provided by the fluoroscopic device):  If the device does not provide the exposure index:  Fluoroscopy Time (in minutes and seconds):  28 seconds  Number of Acquired Images: 1 scout image. Remainder of examination obtained with fluoro store.  FINDINGS: The scout abdominal radiograph demonstrates a nasogastric tube with its tip in the mid stomach. There is a surgical drain in the right upper quadrant of the abdomen. There are  degenerative changes throughout the lumbar spine associated with a convex right scoliosis.  150 ml Omnipaque 300 was injected via the nasogastric tube. Intermittent fluoroscopy was performed. In the supine position, the contrast pools in the gastric fundus. The patient was turned into the right lateral decubitus position. There is adequate drainage into the duodenum. The patient was returned to the supine and LPO positions. The duodenum appears normal. There is no evidence of leak.  IMPRESSION: Interval repair of perforated gastric ulcer.  No evidence of leak.   Electronically Signed   By: Richardean Sale M.D.   On: 11/29/2014 08:45    Anti-infectives: Anti-infectives    Start     Dose/Rate Route Frequency Ordered Stop   11/26/14 0900  fluconazole (DIFLUCAN) IVPB 400 mg    Comments:  Pharm to confirm dosing; intra-abdominal infection   400 mg 100 mL/hr over 120 Minutes Intravenous Every 24 hours 11/26/14 0821     11/25/14 1700  vancomycin (VANCOCIN) IVPB 750 mg/150 ml premix  Status:  Discontinued     750 mg 150 mL/hr over 60 Minutes Intravenous Every 12 hours 11/25/14 1553 11/27/14 1518   11/25/14 1300  piperacillin-tazobactam (ZOSYN) IVPB 3.375 g     3.375 g 12.5 mL/hr over 240 Minutes Intravenous 3 times per day 11/25/14 1227     11/25/14 0430  vancomycin (VANCOCIN) IVPB 1000 mg/200 mL premix     1,000 mg 200 mL/hr over 60 Minutes Intravenous  Once 11/25/14 0420 11/25/14  8127   11/25/14 0430  piperacillin-tazobactam (ZOSYN) IVPB 3.375 g     3.375 g 100 mL/hr over 30 Minutes Intravenous  Once 11/25/14 0420 11/25/14 0547      Assessment/Plan: s/p Procedure(s): LAPAROSCOPIC PRIMARY REPAIR OF PERFORATED PREPYLORIC ULCER WITH GRAHAM PATCH (N/A) Advance diet  D/C drain Start eliquis per cards and d/c heparin Plan for discharge later today if all goes ok  LOS: 6 days    TOTH III,PAUL S 12/01/2014

## 2014-12-01 NOTE — Progress Notes (Signed)
Briefly seen and examined. RRR, lungs clear. Converted to sinus rhythm overnight - in fact, sinus bradycardia in the 50's today. Lopressor 50 mg BID since yesterday. Would decrease dose to 25 mg daily due to bradycardia. Agree with starting Eliquis - he can use 30 day free card, then we can give samples and work with him on insurance coverage.  Ok for d/c from a cardiac standpoint. Follow-up with cardiology midlevel in 7-10 days and eventually with me in the office.  Pixie Casino, MD, [facility] Attending Cardiologist [place]

## 2014-12-01 NOTE — Care Management Note (Signed)
Case Management Note  Patient Details  Name: Thomas Sandoval. MRN: [id number] Date of Birth: [birth year]  Subjective/Objective:  s/p laparoscopic repair of perforated prepyloric ulcer with graham patch - A fib            Action/Plan: Eliquis ordered  Expected Discharge Date:    12/01/14              Expected Discharge Plan:  Home/Self Care  In-House Referral:     Discharge planning Services  CM Consult  Post Acute Care Choice:    Choice offered to:     DME Arranged:    DME Agency:     HH Arranged:    [place] Agency:     Status of Service:  Completed, signed off  Medicare Important Message Given:    Date Medicare IM Given:    Medicare IM give by:    Date Additional Medicare IM Given:    Additional Medicare Important Message give by:     If discussed at [place] of Stay Meetings, dates discussed:    Additional Comments: received referral to provide pt with a 30 day free card for Eliquis. Met with pt and provided the card. Encouraged pt to f/u with PCP and discuss new medication.   Norina Buzzard, RN 12/01/2014, 10:46 AM

## 2014-12-01 NOTE — Discharge Instructions (Addendum)
LAPAROSCOPIC SURGERY: POST OP INSTRUCTIONS ° °1. DIET: Follow a light bland diet the first 24 hours after arrival home, such as soup, liquids, crackers, etc.  Be sure to include lots of fluids daily.  Avoid fast food or heavy meals as your are more likely to get nauseated.  Eat a low fat the next few days after surgery.   °2. Take your usually prescribed home medications unless otherwise directed. °3. PAIN CONTROL: °a. Pain is best controlled by a usual combination of three different methods TOGETHER: °i. Ice/Heat °ii. Over the counter pain medication °iii. Prescription pain medication °b. Most patients will experience some swelling and bruising around the incisions.  Ice packs or heating pads (30-60 minutes up to 6 times a day) will help. Use ice for the first few days to help decrease swelling and bruising, then switch to heat to help relax tight/sore spots and speed recovery.  Some people prefer to use ice alone, heat alone, alternating between ice & heat.  Experiment to what works for you.  Swelling and bruising can take several weeks to resolve.   °c. It is helpful to take an over-the-counter pain medication regularly for the first few weeks.  Choose one of the following that works best for you: °i. Naproxen (Aleve, etc)  Two 220mg tabs twice a day °ii. Ibuprofen (Advil, etc) Three 200mg tabs four times a day (every meal & bedtime) °iii. Acetaminophen (Tylenol, etc) 500-650mg four times a day (every meal & bedtime) °d. A  prescription for pain medication (such as oxycodone, hydrocodone, etc) should be given to you upon discharge.  Take your pain medication as prescribed.  °i. If you are having problems/concerns with the prescription medicine (does not control pain, nausea, vomiting, rash, itching, etc), please call us [phone] to see if we need to switch you to a different pain medicine that will work better for you and/or control your side effect better. °ii. If you need a refill on your pain medication,  please contact your pharmacy.  They will contact our office to request authorization. Prescriptions will not be filled after 5 pm or on week-ends. °4. Avoid getting constipated.  Between the surgery and the pain medications, it is common to experience some constipation.  Increasing fluid intake and taking a fiber supplement (such as Metamucil, Citrucel, FiberCon, MiraLax, etc) 1-2 times a day regularly will usually help prevent this problem from occurring.  A mild laxative (prune juice, Milk of Magnesia, MiraLax, etc) should be taken according to package directions if there are no bowel movements after 48 hours.   °5. Watch out for diarrhea.  If you have many loose bowel movements, simplify your diet to bland foods & liquids for a few days.  Stop any stool softeners and decrease your fiber supplement.  Switching to mild anti-diarrheal medications (Kayopectate, Pepto Bismol) can help.  If this worsens or does not improve, please call us. °6. Wash / shower every day.  You may shower over the dressings as they are waterproof.  Continue to shower over incision(s) after the dressing is off. °7. Remove your waterproof bandages 5 days after surgery.  You may leave the incision open to air.  You may replace a dressing/Band-Aid to cover the incision for comfort if you wish.  °8. ACTIVITIES as tolerated:   °a. You may resume regular (light) daily activities beginning the next day--such as daily self-care, walking, climbing stairs--gradually increasing activities as tolerated.  If you can walk 30 minutes without difficulty, it   is safe to try more intense activity such as jogging, treadmill, bicycling, low-impact aerobics, swimming, etc. b. Save the most intensive and strenuous activity for last such as sit-ups, heavy lifting, contact sports, etc  Refrain from any heavy lifting or straining until you are off narcotics for pain control.   c. DO NOT PUSH THROUGH PAIN.  Let pain be your guide: If it hurts to do something, don't  do it.  Pain is your body warning you to avoid that activity for another week until the pain goes down. d. You may drive when you are no longer taking prescription pain medication, you can comfortably wear a seatbelt, and you can safely maneuver your car and apply brakes. e. Dennis Bast may have sexual intercourse when it is comfortable.  9. FOLLOW UP in our office a. Please call [facility] at [phone] to set up an appointment to see your surgeon in the office for a follow-up appointment approximately 2-3 weeks after your surgery. b. Make sure that you call for this appointment the day you arrive home to insure a convenient appointment time. 10. IF YOU HAVE DISABILITY OR FAMILY LEAVE FORMS, BRING THEM TO THE OFFICE FOR PROCESSING.  DO NOT GIVE THEM TO YOUR DOCTOR.   WHEN TO CALL us [phone]: 1. Poor pain control 2. Reactions / problems with new medications (rash/itching, nausea, etc)  3. Fever over 101.5 F (38.5 C) 4. Inability to urinate 5. Nausea and/or vomiting 6. Worsening swelling or bruising 7. Continued bleeding from incision. 8. Increased pain, redness, or drainage from the incision   The clinic staff is available to answer your questions during regular business hours (8:30am-5pm).  Please dont hesitate to call and ask to speak to one of our nurses for clinical concerns.   If you have a medical emergency, go to the nearest emergency room or call 911.  A surgeon from [facility] is always on call at the [facility], [address], [place], [place], [place]  [address] ? MAIN: [phone] ? TOLL FREE: [phone] ?  FAX [phone] [email or web address]    IT IS VERY IMPORTANT THAT YOU TAKING PROTONIX EVERY DAY!!!! ALSO AVOID IBUPROFEN, MOTRIN, GOODY POWDER MEDICATION.    Information on my medicine - ELIQUIS (apixaban)  This medication education was reviewed with me or my healthcare representative as part of  my discharge preparation.  The pharmacist that spoke with me during my hospital stay was:  Jaquita Folds, [facility]  Why was Eliquis prescribed for you? Eliquis was prescribed for you to reduce the risk of a blood clot forming that can cause a stroke if you have a medical condition called atrial fibrillation (a type of irregular heartbeat).  What do You need to know about Eliquis ? Take your Eliquis TWICE DAILY - one tablet in the morning and one tablet in the evening with or without food. If you have difficulty swallowing the tablet whole please discuss with your pharmacist how to take the medication safely.  Take Eliquis exactly as prescribed by your doctor and DO NOT stop taking Eliquis without talking to the doctor who prescribed the medication.  Stopping may increase your risk of developing a stroke.  Refill your prescription before you run out.  After discharge, you should have regular check-up appointments with your healthcare provider that is prescribing your Eliquis.  In the future your dose may need to be changed if your kidney function or weight changes by a  significant amount or as you get older.  What do you do if you miss a dose? If you miss a dose, take it as soon as you remember on the same day and resume taking twice daily.  Do not take more than one dose of ELIQUIS at the same time to make up a missed dose.  Important Safety Information A possible side effect of Eliquis is bleeding. You should call your healthcare provider right away if you experience any of the following: ? Bleeding from an injury or your nose that does not stop. ? Unusual colored urine (red or dark brown) or unusual colored stools (red or black). ? Unusual bruising for unknown reasons. ? A serious fall or if you hit your head (even if there is no bleeding).  Some medicines may interact with Eliquis and might increase your risk of bleeding or clotting while on Eliquis. To help avoid this, consult your  healthcare provider or pharmacist prior to using any new prescription or non-prescription medications, including herbals, vitamins, non-steroidal anti-inflammatory drugs (NSAIDs) and supplements.  This website has more information on Eliquis (apixaban): http://[email or web address].com/eliquis/home

## 2014-12-01 NOTE — Progress Notes (Signed)
ANTICOAGULATION CONSULT NOTE - Follow Up Consult  Pharmacy Consult for Apixaban Indication: atrial fibrillation  Allergies  Allergen Reactions  . Zithromax [Azithromycin] Other (See Comments)    "Severe stomach cramps; told to list as an allergy by dr. Years ago"  . Avelox [Moxifloxacin Hcl In Nacl] Other (See Comments)    Hemolysis  In 2012  . Bactrim [Sulfamethoxazole-Trimethoprim] Diarrhea and Nausea And Vomiting    Patient Measurements: Height: 5\' 9"  (175.3 cm) Weight: 153 lb 14.1 oz (69.8 kg) IBW/kg (Calculated) : 70.7 Heparin Dosing Weight:   Vital Signs: Temp: 98.6 F (37 C) (09/24 0500) Temp Source: Oral (09/24 0500) BP: 135/52 mmHg (09/24 0500) Pulse Rate: 63 (09/24 0500)  Labs:  Recent Labs  11/30/14 0355 12/01/14 0346  HGB  --  11.4*  HCT  --  32.4*  PLT  --  225  HEPARINUNFRC  --  0.25*  CREATININE 0.79 0.83    Estimated Creatinine Clearance: 96.9 mL/min (by C-G formula based on Cr of 0.83).   Medications:  Scheduled:  . antiseptic oral rinse  7 mL Mouth Rinse BID  . apixaban  5 mg Oral BID  . apixaban  5 mg Oral NOW  . fluconazole (DIFLUCAN) IV  400 mg Intravenous Q24H  . insulin aspart  0-15 Units Subcutaneous TID WC & HS  . insulin glargine  20 Units Subcutaneous Q24H  . metoprolol tartrate  50 mg Oral BID  . mometasone-formoterol  2 puff Inhalation BID  . pantoprazole (PROTONIX) IV  40 mg Intravenous Q12H  . piperacillin-tazobactam (ZOSYN)  IV  3.375 g Intravenous 3 times per day    Assessment: 59yo male with new AFib.  Heparin started last PM, now to change to Apixaban with plan to d/c home on this.  Per d/w RN, heparin already stopped and pt to go home; pt's insurance will not cover & Cards has not seen yet today.  RN to contact Cards.  As heparin has been stopped, will start Apixaban.  Hg 11.4 and pltc 133.  No bleeding noted  Goal of Therapy:  Prevention of stroke Monitor platelets by anticoagulation protocol: Yes   Plan:   Apixaban 5mg  po bid, first dose now Watch for s/s of bleeding  Gracy Bruins, PharmD Clinical Pharmacist [facility]

## 2014-12-01 NOTE — Progress Notes (Signed)
ANTICOAGULATION CONSULT NOTE - Follow-up Consult  Pharmacy Consult for Heparin Indication: atrial fibrillation  Allergies  Allergen Reactions  . Zithromax [Azithromycin] Other (See Comments)    "Severe stomach cramps; told to list as an allergy by dr. Years ago"  . Avelox [Moxifloxacin Hcl In Nacl] Other (See Comments)    Hemolysis  In 2012  . Bactrim [Sulfamethoxazole-Trimethoprim] Diarrhea and Nausea And Vomiting    Patient Measurements: Height: 5\' 9"  (175.3 cm) Weight: 153 lb 14.1 oz (69.8 kg) IBW/kg (Calculated) : 70.7   Vital Signs: Temp: 98.6 F (37 C) (09/24 0500) Temp Source: Oral (09/24 0500) BP: 135/52 mmHg (09/24 0500) Pulse Rate: 63 (09/24 0500)  Labs:  Recent Labs  11/30/14 0355 12/01/14 0346  HGB  --  11.4*  HCT  --  32.4*  PLT  --  225  HEPARINUNFRC  --  0.25*  CREATININE 0.79 0.83    Estimated Creatinine Clearance: 96.9 mL/min (by C-G formula based on Cr of 0.83).  Assessment: 59 year old male on begin heparin for Afib s/p repair of perforated ulcer. Heparin level subtherapeutic (0.25) on 1050 units/hr. Hgb down a bit, plt ok. No issues with line or bleeding reported per RN. Plan to start NOAC when possible  Goal of Therapy:  Heparin level 0.3-0.7 units/ml Monitor platelets by anticoagulation protocol: Yes   Plan:  Increase heparin drip to 1200 units / hr Will f/u 6 hr heparin level  Sherlon Handing, PharmD, BCPS Clinical pharmacist, pager [phone] 12/01/2014,5:40 AM

## 2014-12-06 ENCOUNTER — Encounter: Payer: Self-pay | Admitting: Physician Assistant

## 2014-12-06 DIAGNOSIS — I341 Nonrheumatic mitral (valve) prolapse: Secondary | ICD-10-CM | POA: Insufficient documentation

## 2014-12-06 DIAGNOSIS — R001 Bradycardia, unspecified: Secondary | ICD-10-CM | POA: Insufficient documentation

## 2014-12-06 NOTE — Progress Notes (Signed)
Cardiology Office Note Date:  12/07/2014  Patient ID:  Thomas Sandoval., DOB 1956/01/28, MRN [id number] PCP:  Chevis Pretty, FNP  Cardiologist:   Dr. Debara Pickett   Chief Complaint: f/u hospitalization for atrial fib  History of Present Illness: Thomas Sandoval. is a 59 y.o. male with history of DM, COPD, hemolytic anemia in 2012 felt 2/2 Avelox (nadir 5.5/15.4) with atrial flutter during that admission, GERD, DDD, bladder cancer, PAD (by dopplers, bilateral fem-pop occlusion - treated medically for now by [facility]) who presents for post-hospital follow-up. Per notes, in 2012, EF was normal and pt did not follow up as OP. He was recently admitted 9/18-9/24 with a perforated pre-pyloric ulcer that was repaired laparoscopically. He had an episode of transient atrial fibrillation during this admission felt sympathetically driven, and spontaneously converted to NSR. 2D Echo 11/27/14: EF 55-60%; images were inadequate for LV wall motion assessment, + mild late systolic mitral valve prolapse involving the anterior leaflet. There is no discharge summary from this hospitalization but last cardiology note indicated plan to start Eliquis using free card first. His Lopressor was decreased to 25mg  daily due to sinus bradycardia (HR 50s). Labs during admission also notable for Mg 1.4, Na down to 131, normal TSH, Hgb 11.4 (prev 14 range).  He comes in today for follow-up feeling well. 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 No history of chest pain. He has not had any recurrent palpitations since discharge. He stopped metoprolol because it made him feel "bad" - difficult to describe this. He did not start Eliquis because he was not given a prescription for this.  Past Medical History  Diagnosis Date  . GERD (gastroesophageal reflux disease)   . Type 1 diabetes mellitus 1977  . History of hemolytic  anemia 02/2011    secondary to Avelox  . History of atrial flutter 02/2011    Converted to NSR with Cardizem  . COPD (chronic obstructive pulmonary disease)   . History of chronic bronchitis   . Diabetic retinopathy of both eyes   . Smokers' cough   . Productive cough   . Arthritis   . DDD (degenerative disc disease)   . HOH (hard of hearing)   . Chronic back pain   . Bladder cancer   . HOH (hard of hearing)     no eardrum and nerve damage on R, also HOH on L  . PAD (peripheral artery disease) 04/2014    Dr Trula Slade; bilateral SFA occlusion, R mid, L distal  . PAF (paroxysmal atrial fibrillation)     a. Dx 11/2014 during admission for perf ulcer.  . Perforated ulcer     a. 11/2014 s/p surgery.  . Mitral valve prolapse     a. 2D Echo 11/27/14: EF 55-60%; images were inadequate for LV wall motion assessment, + mild late systolic mitral valve prolapse involving the anterior leaflet.    Past Surgical History  Procedure Laterality Date  . Esophagogastroduodenoscopy N/A 02/06/2013    Procedure: ESOPHAGOGASTRODUODENOSCOPY (EGD);  Surgeon: Lafayette Dragon, MD;  Location: [facility]COPY;  Service: Endoscopy;  Laterality: N/A;  . Transthoracic echocardiogram  02-17-2011    MODERATE LVH/  EF 65%  . Tonsillectomy  as child  . Tympanic membrane repair  as child  . Transurethral resection of bladder tumor with gyrus (turbt-gyrus) N/A 06/12/2013    Procedure: TRANSURETHRAL RESECTION OF BLADDER  TUMOR WITH GYRUS (TURBT-GYRUS);  Surgeon: Claybon Jabs, MD;  Location: [facility];  Service: Urology;  Laterality: N/A;  . Cystoscopy with ureteroscopy Right 08/14/2013    Procedure: CYSTOSCOPY WITH URETEROSCOPY BLADDER BIOPSY ;  Surgeon: Claybon Jabs, MD;  Location: [facility];  Service: Urology;  Laterality: Right;  . Laparoscopy N/A 11/25/2014    Procedure: LAPAROSCOPIC PRIMARY REPAIR OF PERFORATED PREPYLORIC ULCER WITH Silvestre Gunner;  Surgeon: Greer Pickerel, MD;  Location: [place];   Service: General;  Laterality: N/A;    Current Outpatient Prescriptions  Medication Sig Dispense Refill  . acetaminophen (TYLENOL) 325 MG tablet Take 2 tablets (650 mg total) by mouth every 6 (six) hours as needed for mild pain (or Fever >/= 101).    . ADVAIR DISKUS 250-50 MCG/DOSE AEPB INHALE 1 PUFF INTO THE LUNGS EVERY EVENING. 60 each 4  . albuterol (PROVENTIL HFA;VENTOLIN HFA) 108 (90 BASE) MCG/ACT inhaler Inhale 2 puffs into the lungs every 6 (six) hours as needed for wheezing or shortness of breath. 1 Inhaler 2  . famotidine (PEPCID) 20 MG tablet Take 20 mg by mouth every morning.    Marland Kitchen HYDROcodone-acetaminophen (NORCO/VICODIN) 5-325 MG per tablet Take 1-2 tablets by mouth every 4 (four) hours as needed for moderate pain or severe pain. 50 tablet 0  . insulin aspart (NOVOLOG) 100 UNIT/ML injection Inject 2-7 Units into the skin 3 (three) times daily as needed for high blood sugar. PER SLIDING SCALE: 190 - 200 = 2 units 300 and above = 7 units    . LANTUS 100 UNIT/ML injection INJECT 40 UNITS SUBCUTANEOUSLY EVERY MORNING 20 mL 0  . loratadine (CLARITIN) 10 MG tablet Take 10 mg by mouth at bedtime.       No current facility-administered medications for this visit.    Allergies:   Zithromax; Avelox; and Bactrim   Social History:  The patient  reports that he has been smoking Cigarettes.  He has a 60 pack-year smoking history. He quit smokeless tobacco use about 36 years ago. He reports that he does not drink alcohol or use illicit drugs.   Family History:  The patient's family history includes Breast cancer in his mother; Cancer in his mother; Diabetes in his son; Heart attack in his father; Heart disease in his father; Rheumatic fever in his father.  ROS:  Please see the history of present illness.  All other systems are reviewed and otherwise negative.   PHYSICAL EXAM:  VS:  BP 162/70 mmHg  Pulse 66  Ht 5\' 9"  (1.753 m)  Wt 151 lb (68.493 kg)  BMI 22.29 kg/m2 BMI: Body mass index is  22.29 kg/(m^2). Well nourished, well developed thin WM in no acute distress HEENT: normocephalic, atraumatic Neck: no JVD, carotid bruits or masses Cardiac:  normal S1, S2; RRR; no murmurs, rubs, or gallops Lungs:  clear to auscultation bilaterally, no wheezing, rhonchi or rales Abd: soft, nontender, no hepatomegaly, + BS MS: no deformity or atrophy Ext: no edema Skin: warm and dry, no rash Neuro:  moves all extremities spontaneously, no focal abnormalities noted, follows commands Psych: euthymic mood, full affect   EKG:  Done today showws NSR 66bpm no acute ST-T changes  Recent Labs: 11/26/2014: ALT 15* 11/27/2014: TSH 3.148 12/01/2014: BUN 8; Creatinine, Ser 0.83; Hemoglobin 11.4*; Magnesium 1.5*; Platelets 225; Potassium 3.8; Sodium 133*  03/27/2014: Chol/HDL Ratio 3.2; Cholesterol, Total 110; HDL 34*; LDL Calculated 60; Triglycerides 80   Estimated Creatinine Clearance: 95.1 mL/min (by C-G  formula based on Cr of 0.83).   Wt Readings from Last 3 Encounters:  12/07/14 151 lb (68.493 kg)  11/25/14 153 lb 14.1 oz (69.8 kg)  09/14/14 157 lb 9.6 oz (71.487 kg)     Other studies reviewed: Additional studies/records reviewed today include: summarized above  ASSESSMENT AND PLAN:  1. Recent atrial fibrillation during admission for perforated ulcer - this is now his second episode of atrial arrhythmias. Longterm anticoagulation recommended by inpatient team - CHADSVASC = 3. Unfortunately he was not given an prescription for this at discharge and did not contact anyone to obtain this. Will start as outpatient - he qualifies for 5mg  BID. He seems very skeptical of his need to take a blood thinner. I did my best to explain the rationale of stroke risk. We discussed importance of monitoring of bleeding while on anticoagulation. Since BP is running high and he would benefit from an AVN blocker for some protection against recurrent AF-RVR, will try Diltiazem CD 120mg  daily instead. He did not  tolerate BB well.  2. Essential HTN - he brings in a log of BPs, many of which are 130s/80s, but he also reports on one occasion after discharge his BP was 683-729M systolic. It is elevated in clinic today. He will continue to follow with addition of diltiazem. 3. Remote h/o atrial flutter - see above. 4. Sinus bradycardia - see above. Will start dilt at lowest dose - the patient follows his HR at home. 5. Hypomagnesemia - recent Mg quite low in hospital. Since this can contribute to arrhythmias will recheck today with BMET. 6. Anemia - f/u CBC. 7. Mitral valve prolapse - follow clinically.  Disposition: F/u with Dr. Debara Pickett in 3 months.  Current medicines are reviewed at length with the patient today.  The patient did not have any concerns regarding medicines.  Raechel Ache PA-C 12/07/2014 3:23 PM     [facility] [address] [place], [place] [address] Phone: [phone]

## 2014-12-07 ENCOUNTER — Encounter: Payer: Self-pay | Admitting: Physician Assistant

## 2014-12-07 ENCOUNTER — Ambulatory Visit (INDEPENDENT_AMBULATORY_CARE_PROVIDER_SITE_OTHER): Payer: BLUE CROSS/BLUE SHIELD | Admitting: Physician Assistant

## 2014-12-07 VITALS — BP 162/70 | HR 66 | Ht 69.0 in | Wt 151.0 lb

## 2014-12-07 DIAGNOSIS — D649 Anemia, unspecified: Secondary | ICD-10-CM

## 2014-12-07 DIAGNOSIS — Z79899 Other long term (current) drug therapy: Secondary | ICD-10-CM

## 2014-12-07 DIAGNOSIS — I10 Essential (primary) hypertension: Secondary | ICD-10-CM | POA: Insufficient documentation

## 2014-12-07 DIAGNOSIS — I341 Nonrheumatic mitral (valve) prolapse: Secondary | ICD-10-CM

## 2014-12-07 DIAGNOSIS — Z8679 Personal history of other diseases of the circulatory system: Secondary | ICD-10-CM | POA: Diagnosis not present

## 2014-12-07 DIAGNOSIS — I480 Paroxysmal atrial fibrillation: Secondary | ICD-10-CM | POA: Diagnosis not present

## 2014-12-07 DIAGNOSIS — E1159 Type 2 diabetes mellitus with other circulatory complications: Secondary | ICD-10-CM | POA: Insufficient documentation

## 2014-12-07 DIAGNOSIS — R001 Bradycardia, unspecified: Secondary | ICD-10-CM | POA: Diagnosis not present

## 2014-12-07 LAB — BASIC METABOLIC PANEL
BUN: 10 mg/dL (ref 7–25)
CHLORIDE: 100 mmol/L (ref 98–110)
CO2: 27 mmol/L (ref 20–31)
Calcium: 9.5 mg/dL (ref 8.6–10.3)
Creat: 0.8 mg/dL (ref 0.70–1.33)
Glucose, Bld: 95 mg/dL (ref 65–99)
Potassium: 4.4 mmol/L (ref 3.5–5.3)
SODIUM: 134 mmol/L — AB (ref 135–146)

## 2014-12-07 LAB — CBC
HCT: 37 % — ABNORMAL LOW (ref 39.0–52.0)
HEMOGLOBIN: 12.6 g/dL — AB (ref 13.0–17.0)
MCH: 32.3 pg (ref 26.0–34.0)
MCHC: 34.1 g/dL (ref 30.0–36.0)
MCV: 94.9 fL (ref 78.0–100.0)
MPV: 10.3 fL (ref 8.6–12.4)
PLATELETS: 479 10*3/uL — AB (ref 150–400)
RBC: 3.9 MIL/uL — AB (ref 4.22–5.81)
RDW: 14.4 % (ref 11.5–15.5)
WBC: 6.8 10*3/uL (ref 4.0–10.5)

## 2014-12-07 LAB — MAGNESIUM: Magnesium: 1.9 mg/dL (ref 1.5–2.5)

## 2014-12-07 MED ORDER — APIXABAN 5 MG PO TABS: 5.0000 mg | ORAL_TABLET | Freq: Two times a day (BID) | ORAL | Status: DC

## 2014-12-07 MED ORDER — DILTIAZEM HCL ER COATED BEADS 120 MG PO CP24: 120.0000 mg | ORAL_CAPSULE | Freq: Every day | ORAL | Status: DC

## 2014-12-07 NOTE — Patient Instructions (Signed)
Your physician wants you to follow-up in: 3 Months with Dr Debara Pickett. You will receive a reminder letter in the mail two months in advance. If you don't receive a letter, please call our office to schedule the follow-up appointment.  Your physician has recommended you make the following change in your medication: START Diltiazem 120 mg daily and Eliquis 5 mg twice a day  Your physician recommends that you return for lab work Today CBC, BMP and Magnesium

## 2014-12-12 DIAGNOSIS — K259 Gastric ulcer, unspecified as acute or chronic, without hemorrhage or perforation: Secondary | ICD-10-CM | POA: Diagnosis present

## 2014-12-12 DIAGNOSIS — K668 Other specified disorders of peritoneum: Secondary | ICD-10-CM | POA: Diagnosis present

## 2014-12-12 DIAGNOSIS — K275 Chronic or unspecified peptic ulcer, site unspecified, with perforation: Secondary | ICD-10-CM | POA: Diagnosis present

## 2014-12-12 DIAGNOSIS — K659 Peritonitis, unspecified: Secondary | ICD-10-CM | POA: Diagnosis present

## 2014-12-12 NOTE — Discharge Summary (Signed)
DELAYED ENTRY DUE TO NOTE BEING IN ANOTHER PROVIDERS BOX.  Thomas Sandoval Surgery Discharge Summary   Patient ID: Thomas Sandoval. MRN: [id number] DOB/AGE: [birth year] 57 y.o.  Admit date: 11/25/2014 Discharge date: 12/12/2014  Admitting Diagnosis: Pneumoperitoneum Peritonitis Perforated ulcer  Discharge Diagnosis Patient Active Problem List   Diagnosis Date Noted  . Prepyloric ulcer 12/12/2014  . Perforated ulcer ([place]) 12/12/2014  . Peritonitis ([place]) 12/12/2014  . Pneumoperitoneum 12/12/2014  . Essential hypertension 12/07/2014  . Mitral valve prolapse 12/06/2014  . Sinus bradycardia 12/06/2014  . Paroxysmal atrial fibrillation ([place]) 12/06/2014  . Atrial fibrillation with rapid ventricular response ([place]) 11/26/2014  . Perforated gastric ulcer ([place]) 11/25/2014  . Pain in joint, lower leg 05/07/2014  . Other specified gastritis without mention of hemorrhage 02/06/2013  . Hematemesis 02/04/2013  . Intractable vomiting 02/04/2013  . Heme positive stool 02/04/2013  . Coffee ground emesis 02/04/2013  . DM type 2 (diabetes mellitus, type 2) ([place]) 02/04/2013  . Acute bronchitis 02/01/2013  . Cough 02/01/2013  . Hemolysis 02/23/2011  . Anemia 02/21/2011  . Community aquired pneumonia 02/20/2011  . COPD (chronic obstructive pulmonary disease) ([place]) 02/20/2011  . Hypoxemia 02/20/2011  . Atrial flutter/fib 02/16/2011  . URTI (acute upper respiratory infection) 02/16/2011  . DM (diabetes mellitus) ([place]) 02/16/2011    Consultants Cardiology (Dr. Debara Sandoval)  Imaging: No results found.  Procedures Dr. Redmond Sandoval (11/25/14) - Laparoscopic primary repair of perforated pre-pyloric ulcer with [place] [facility] Course:  [name] with DM2, COPD, DDD, tobacco abuse came back to the ED this evening for persistent n/v/inability to take PO/and now severe abdominal pain. Pt was in ED early Sat AM for productive, n/v, weakness. States he really didn't have any abdominal pain at that  time. His symptoms began on Friday. Thought he was getting bronchitis again which he has had in past. Labs and CXR done which showed an elevated WBC but no PNA. Dx with viral syndrome and sent home. Came back for persistent n/v and now severe abd pain. No prior episodes like this. Subjective f/c. No hematemesis. No dysuria/melena/hematochezia. Smokes several packs of cigarettes a day. Take 3-4 motrin three times a day for "degenerative" back disease. Denies etoh/drugs. Will get calf pain after walking several hundred yards, takes pletal.  Workup showed peritonitis, pneumoperitoneum thought to be likely perforated gastric/duodenal ulcers.  Patient was admitted and underwent procedure listed above.  Tolerated procedure well and was transferred to the floor. He was found to have perforated pre-pyloric ulcer.  IV antibiotics were continued for 7 days.  Cardiology was consulted due to AFIB with RVR after surgery.  AFIB resolved spontaneously.  Anticoagulation was not started initially.  He is to follow up with cardiology as an outpatient.  On 11/28/14 he was transferred out of the ICU to telemetry floor.  UGI done on 11/29/14 to check for leak.  UGI showed no leak.  He was started on clear liquids.  His home meds were resumed.  Diet was advanced as tolerated.  On the day of discharge the patient was started on Eliquis for AFIB by Dr. Debara Sandoval.  On POD #6, the patient was voiding well, tolerating diet, ambulating well, pain well controlled, vital signs stable, and felt stable for discharge home.  JP drain was discontinued prior to discharge.  Patient will follow up in our office with Dr. Redmond Sandoval in 2-3 weeks and knows to call with questions or concerns.  He will call to confirm appointment date/time.  He is instructed  to avoid NSAIDS.  He was continued on a PPI, this was called into his pharmacy today.  He will discuss with Dr. Redmond Sandoval about H.pylori treatment at his follow up appointment.        Medication List    STOP  taking these medications        amoxicillin-clavulanate 875-125 MG tablet  Commonly known as:  AUGMENTIN     benzonatate 200 MG capsule  Commonly known as:  TESSALON     cilostazol 100 MG tablet  Commonly known as:  PLETAL     HYDROcodone-acetaminophen 7.5-325 MG tablet  Commonly known as:  NORCO  Replaced by:  HYDROcodone-acetaminophen 5-325 MG tablet     HYDROcodone-homatropine 5-1.5 MG/5ML syrup  Commonly known as:  HYCODAN     ibuprofen 200 MG tablet  Commonly known as:  ADVIL,MOTRIN     ondansetron 4 MG disintegrating tablet  Commonly known as:  ZOFRAN ODT      TAKE these medications        acetaminophen 325 MG tablet  Commonly known as:  TYLENOL  Take 2 tablets (650 mg total) by mouth every 6 (six) hours as needed for mild pain (or Fever >/= 101).     ADVAIR DISKUS 250-50 MCG/DOSE Aepb  Generic drug:  Fluticasone-Salmeterol  INHALE 1 PUFF INTO THE LUNGS EVERY EVENING.     albuterol 108 (90 BASE) MCG/ACT inhaler  Commonly known as:  PROVENTIL HFA;VENTOLIN HFA  Inhale 2 puffs into the lungs every 6 (six) hours as needed for wheezing or shortness of breath.     famotidine 20 MG tablet  Commonly known as:  PEPCID  Take 20 mg by mouth every morning.     HYDROcodone-acetaminophen 5-325 MG tablet  Commonly known as:  NORCO/VICODIN  Take 1-2 tablets by mouth every 4 (four) hours as needed for moderate pain or severe pain.     insulin aspart 100 UNIT/ML injection  Commonly known as:  novoLOG  Inject 2-7 Units into the skin 3 (three) times daily as needed for high blood sugar. PER SLIDING SCALE: 190 - 200 = 2 units 300 and above = 7 units     LANTUS 100 UNIT/ML injection  Generic drug:  insulin glargine  INJECT 40 UNITS SUBCUTANEOUSLY EVERY MORNING     loratadine 10 MG tablet  Commonly known as:  CLARITIN  Take 10 mg by mouth at bedtime.    Started on Protonix 40mg  daily 30-60 min before a meal     Follow-up Information    Follow up with [facility] On 12/19/2014.   Specialty:  General Surgery   Why:  arrive by 8AM for a [phone] for a post operative check up with your surgeon.    Contact information:   [address] [place] [address] [phone]       Follow up with Pixie Casino, MD On 12/07/2014.   Specialty:  Cardiology   Why:  at 3:00PM with Emeline Gins information:   [address] [place] [address] [phone]       Signed: Nat Christen, [facility] [phone]  12/12/2014, 10:20 AM

## 2015-01-07 ENCOUNTER — Other Ambulatory Visit: Payer: Self-pay | Admitting: Family Medicine

## 2015-01-08 NOTE — Telephone Encounter (Signed)
Last seen 09/14/14  [facility]W

## 2015-01-16 ENCOUNTER — Encounter: Payer: Self-pay | Admitting: Internal Medicine

## 2015-02-20 ENCOUNTER — Other Ambulatory Visit: Payer: Self-pay | Admitting: Family Medicine

## 2015-02-20 NOTE — Telephone Encounter (Signed)
Last seen 09/14/14  [facility]W     

## 2015-02-21 ENCOUNTER — Ambulatory Visit: Payer: BLUE CROSS/BLUE SHIELD | Admitting: Internal Medicine

## 2015-03-04 ENCOUNTER — Other Ambulatory Visit: Payer: Self-pay | Admitting: Family Medicine

## 2015-03-08 ENCOUNTER — Telehealth: Payer: Self-pay | Admitting: Nurse Practitioner

## 2015-03-08 NOTE — Telephone Encounter (Signed)
Spoke with pt and advised him to go to urgent care since he has not been seen for this and we were booked the day.

## 2015-03-19 ENCOUNTER — Ambulatory Visit: Payer: BLUE CROSS/BLUE SHIELD | Admitting: Internal Medicine

## 2015-04-08 ENCOUNTER — Other Ambulatory Visit: Payer: Self-pay | Admitting: Family Medicine

## 2015-04-08 NOTE — Telephone Encounter (Signed)
Last seen 09/14/14  [facility]W     

## 2015-04-16 ENCOUNTER — Ambulatory Visit (INDEPENDENT_AMBULATORY_CARE_PROVIDER_SITE_OTHER): Payer: BLUE CROSS/BLUE SHIELD | Admitting: Nurse Practitioner

## 2015-04-16 ENCOUNTER — Encounter: Payer: Self-pay | Admitting: Nurse Practitioner

## 2015-04-16 VITALS — BP 132/72 | HR 65 | Temp 97.4°F | Ht 69.0 in | Wt 162.0 lb

## 2015-04-16 DIAGNOSIS — K2100 Gastro-esophageal reflux disease with esophagitis, without bleeding: Secondary | ICD-10-CM

## 2015-04-16 DIAGNOSIS — N182 Chronic kidney disease, stage 2 (mild): Secondary | ICD-10-CM | POA: Diagnosis not present

## 2015-04-16 DIAGNOSIS — E1122 Type 2 diabetes mellitus with diabetic chronic kidney disease: Secondary | ICD-10-CM | POA: Diagnosis not present

## 2015-04-16 DIAGNOSIS — Z794 Long term (current) use of insulin: Secondary | ICD-10-CM | POA: Diagnosis not present

## 2015-04-16 DIAGNOSIS — I4891 Unspecified atrial fibrillation: Secondary | ICD-10-CM

## 2015-04-16 DIAGNOSIS — J410 Simple chronic bronchitis: Secondary | ICD-10-CM

## 2015-04-16 DIAGNOSIS — I10 Essential (primary) hypertension: Secondary | ICD-10-CM

## 2015-04-16 LAB — POCT GLYCOSYLATED HEMOGLOBIN (HGB A1C): Hemoglobin A1C: 6.3

## 2015-04-16 MED ORDER — ALBUTEROL SULFATE HFA 108 (90 BASE) MCG/ACT IN AERS: 2.0000 | INHALATION_SPRAY | Freq: Four times a day (QID) | RESPIRATORY_TRACT | Status: DC | PRN

## 2015-04-16 MED ORDER — LISINOPRIL 5 MG PO TABS: 5.0000 mg | ORAL_TABLET | Freq: Every day | ORAL | Status: DC

## 2015-04-16 MED ORDER — FAMOTIDINE 20 MG PO TABS: 20.0000 mg | ORAL_TABLET | Freq: Every morning | ORAL | Status: DC

## 2015-04-16 MED ORDER — INSULIN ASPART 100 UNIT/ML ~~LOC~~ SOLN: SUBCUTANEOUS | Status: DC

## 2015-04-16 MED ORDER — FLUTICASONE-SALMETEROL 250-50 MCG/DOSE IN AEPB: INHALATION_SPRAY | RESPIRATORY_TRACT | Status: DC

## 2015-04-16 MED ORDER — INSULIN GLARGINE 100 UNIT/ML ~~LOC~~ SOLN: SUBCUTANEOUS | Status: DC

## 2015-04-16 NOTE — Progress Notes (Signed)
Subjective:    Patient ID: Thomas Medici., male    DOB: 1955-03-17, 60 y.o.   MRN: [id number]  Patient here today for follow up of chronic medical problems.  Outpatient Encounter Prescriptions as of 04/16/2015  Medication Sig  . ADVAIR DISKUS 250-50 MCG/DOSE AEPB INHALE 1 PUFF INTO THE LUNGS EVERY EVENING.  Marland Kitchen albuterol (PROVENTIL HFA;VENTOLIN HFA) 108 (90 BASE) MCG/ACT inhaler Inhale 2 puffs into the lungs every 6 (six) hours as needed for wheezing or shortness of breath.  Marland Kitchen aspirin 81 MG tablet Take 81 mg by mouth daily.  Marland Kitchen HYDROcodone-acetaminophen (NORCO/VICODIN) 5-325 MG per tablet Take 1-2 tablets by mouth every 4 (four) hours as needed for moderate pain or severe pain.  Marland Kitchen insulin aspart (NOVOLOG) 100 UNIT/ML injection Inject 2-7 Units into the skin 3 (three) times daily as needed for high blood sugar. PER SLIDING SCALE: 190 - 200 = 2 units 300 and above = 7 units  . LANTUS 100 UNIT/ML injection INJECT 40 UNITS SUBCUTANEOUSLY EVERY MORNING  . lisinopril (PRINIVIL,ZESTRIL) 5 MG tablet Take 5 mg by mouth daily.  Marland Kitchen loratadine (CLARITIN) 10 MG tablet Take 10 mg by mouth at bedtime.    . famotidine (PEPCID) 20 MG tablet Take 20 mg by mouth every morning. Reported on 04/16/2015  . [DISCONTINUED] acetaminophen (TYLENOL) 325 MG tablet Take 2 tablets (650 mg total) by mouth every 6 (six) hours as needed for mild pain (or Fever >/= 101).  . [DISCONTINUED] apixaban (ELIQUIS) 5 MG TABS tablet Take 1 tablet (5 mg total) by mouth 2 (two) times daily.  . [DISCONTINUED] diltiazem (CARDIZEM CD) 120 MG 24 hr capsule Take 1 capsule (120 mg total) by mouth daily.  . [DISCONTINUED] LANTUS 100 UNIT/ML injection INJECT 40 UNITS SUBCUTANEOUSLY EVERY MORNING   No facility-administered encounter medications on file as of 04/16/2015.      Hypertension This is a chronic problem. The problem is controlled. Risk factors for coronary artery disease include diabetes mellitus, male gender and sedentary lifestyle.  Past treatments include nothing. Compliance problems include diet and exercise.  There is no history of CAD/MI, CVA or retinopathy.  Diabetes He presents for his follow-up diabetic visit. He has type 2 diabetes mellitus. No MedicAlert identification noted. There are no hypoglycemic associated symptoms. There are no hypoglycemic complications. Symptoms are stable. Pertinent negatives for diabetic complications include no CVA, nephropathy, peripheral neuropathy or retinopathy. Risk factors for coronary artery disease include diabetes mellitus, hypertension, male sex and obesity. Current diabetic treatment includes insulin injections. He is compliant with treatment most of the time. His weight is stable. When asked about meal planning, he reported none. He has not had a previous visit with a dietitian. He rarely participates in exercise. His breakfast blood glucose is taken between 8-9 am. His breakfast blood glucose range is generally 90-110 mg/dl. His lunch blood glucose is taken between 1-2 pm. His lunch blood glucose range is generally 110-130 mg/dl. His highest blood glucose is >200 mg/dl. His overall blood glucose range is 110-130 mg/dl. An ACE inhibitor/angiotensin II receptor blocker is not being taken. He does not see a podiatrist.Eye exam is current (can't remember when he had it though).  GERD with gastritis Uses pepcid- has no complaints with stomach pain Atrial Fib On diltiazem but is not taking it - was also put on eliquis but patient said that it made him feel terrible so he stopped them both. HE ABSOLUTELY REFUSES TO TAKE MEDS. He does not agree with cardiology. He thinks  that the gastric bleed he had caused the atrial fib and that since that was taken care of he does not feel like he needs meds. COPD advair discus daily-Uses albuterol sometimes- smokes daily and does  Not want to quit     Review of Systems  Constitutional: Negative.   HENT: Negative.   Respiratory: Negative.     Cardiovascular: Negative.   Gastrointestinal: Negative.   Genitourinary: Negative.   Neurological: Negative.   Psychiatric/Behavioral: Negative.   All other systems reviewed and are negative.      Objective:   Physical Exam  Constitutional: He is oriented to person, place, and time. He appears well-developed and well-nourished.  HENT:  Head: Normocephalic.  Right Ear: External ear normal.  Left Ear: External ear normal.  Nose: Nose normal.  Mouth/Throat: Oropharynx is clear and moist.  Eyes: EOM are normal. Pupils are equal, round, and reactive to light.  Neck: Normal range of motion. Neck supple. No JVD present. No thyromegaly present.  Cardiovascular: Normal rate, regular rhythm, normal heart sounds and intact distal pulses.  Exam reveals no gallop and no friction rub.   No murmur heard. Pulmonary/Chest: Effort normal and breath sounds normal. No respiratory distress. He has no wheezes. He has no rales. He exhibits no tenderness.  Abdominal: Soft. Bowel sounds are normal. He exhibits no mass. There is no tenderness.  Genitourinary: Prostate normal and penis normal.  Musculoskeletal: Normal range of motion. He exhibits no edema.  Lymphadenopathy:    He has no cervical adenopathy.  Neurological: He is alert and oriented to person, place, and time. No cranial nerve deficit.  Skin: Skin is warm and dry.  Psychiatric: He has a normal mood and affect. His behavior is normal. Judgment and thought content normal.    BP 132/72 mmHg  Pulse 65  Temp(Src) 97.4 F (36.3 C) (Oral)  Ht 5' 9"  (1.753 m)  Wt 162 lb (73.483 kg)  BMI 23.91 kg/m2  Results for orders placed or performed in visit on 04/16/15  POCT glycosylated hemoglobin (Hb A1C)  Result Value Ref Range   Hemoglobin A1C 6.3    EKG- NSR-[name] Hassell Done, FNP       Assessment & Plan:  1. Type 2 diabetes mellitus with stage 2 chronic kidney disease, with long-term current use of insulin (HCC) conytinue to watch  carbs in diet - POCT glycosylated hemoglobin (Hb A1C) - CMP14+EGFR - Lipid panel - Microalbumin / creatinine urine ratio - insulin aspart (NOVOLOG) 100 UNIT/ML injection; PER SLIDING SCALE: 190 - 200 = 2 units 300 and above = 7 units  Dispense: 10 mL; Refill: 5 - insulin glargine (LANTUS) 100 UNIT/ML injection; INJECT 40 UNITS SUBCUTANEOUSLY EVERY MORNING  Dispense: 20 mL; Refill: 5  2. Atrial fibrillation with rapid ventricular response ([place]) Had long discussion with patient about atrial fib and the importance of diltiazem and eliquis- patient still does not agree with treatment Encouraged to follow u with cardiology - EKG 12-Lead  3. Essential hypertension Do not add salt to diet - lisinopril (PRINIVIL,ZESTRIL) 5 MG tablet; Take 1 tablet (5 mg total) by mouth daily.  Dispense: 30 tablet; Refill: 5  4. Gastroesophageal reflux disease with esophagitis Avoid spicy foods Do not eat 2 hours prior to bedtime - famotidine (PEPCID) 20 MG tablet; Take 1 tablet (20 mg total) by mouth every morning. Reported on 04/16/2015  Dispense: 30 tablet; Refill: 5  5. Simple chronic bronchitis (HCC) STOP SMOKING - Fluticasone-Salmeterol (ADVAIR DISKUS) 250-50 MCG/DOSE AEPB; INHALE 1 PUFF INTO  THE LUNGS EVERY EVENING.  Dispense: 60 each; Refill: 5    Labs pending Health maintenance reviewed Diet and exercise encouraged Continue all meds Follow up  In 3 month   [place], FNP

## 2015-04-16 NOTE — Patient Instructions (Signed)

## 2015-04-17 LAB — CMP14+EGFR
ALBUMIN: 4.4 g/dL (ref 3.5–5.5)
ALT: 10 IU/L (ref 0–44)
AST: 17 IU/L (ref 0–40)
Albumin/Globulin Ratio: 2.2 (ref 1.1–2.5)
Alkaline Phosphatase: 55 IU/L (ref 39–117)
BUN/Creatinine Ratio: 12 (ref 9–20)
BUN: 11 mg/dL (ref 6–24)
Bilirubin Total: 0.9 mg/dL (ref 0.0–1.2)
CALCIUM: 9.3 mg/dL (ref 8.7–10.2)
CO2: 23 mmol/L (ref 18–29)
CREATININE: 0.89 mg/dL (ref 0.76–1.27)
Chloride: 95 mmol/L — ABNORMAL LOW (ref 96–106)
GFR calc Af Amer: 110 mL/min/{1.73_m2} (ref >59)
GFR, EST NON AFRICAN AMERICAN: 95 mL/min/{1.73_m2} (ref >59)
GLOBULIN, TOTAL: 2 g/dL (ref 1.5–4.5)
Glucose: 190 mg/dL — ABNORMAL HIGH (ref 65–99)
Potassium: 5 mmol/L (ref 3.5–5.2)
SODIUM: 132 mmol/L — AB (ref 134–144)
Total Protein: 6.4 g/dL (ref 6.0–8.5)

## 2015-04-17 LAB — MICROALBUMIN / CREATININE URINE RATIO
Creatinine, Urine: 165.9 mg/dL
MICROALB/CREAT RATIO: 7.4 mg/g{creat} (ref 0.0–30.0)
Microalbumin, Urine: 12.2 ug/mL

## 2015-04-17 LAB — LIPID PANEL
CHOL/HDL RATIO: 2.7 ratio (ref 0.0–5.0)
Cholesterol, Total: 85 mg/dL — ABNORMAL LOW (ref 100–199)
HDL: 31 mg/dL — ABNORMAL LOW (ref >39)
LDL CALC: 37 mg/dL (ref 0–99)
TRIGLYCERIDES: 85 mg/dL (ref 0–149)
VLDL CHOLESTEROL CAL: 17 mg/dL (ref 5–40)

## 2015-04-23 ENCOUNTER — Telehealth: Payer: Self-pay | Admitting: Nurse Practitioner

## 2015-04-23 NOTE — Telephone Encounter (Signed)
Reviewed results with pt .

## 2015-06-14 ENCOUNTER — Encounter: Payer: Self-pay | Admitting: Family

## 2015-06-17 ENCOUNTER — Other Ambulatory Visit: Payer: Self-pay | Admitting: *Deleted

## 2015-06-17 DIAGNOSIS — M79605 Pain in left leg: Principal | ICD-10-CM

## 2015-06-17 DIAGNOSIS — M79604 Pain in right leg: Secondary | ICD-10-CM

## 2015-06-24 ENCOUNTER — Ambulatory Visit: Payer: BLUE CROSS/BLUE SHIELD | Admitting: Family

## 2015-06-24 ENCOUNTER — Ambulatory Visit (HOSPITAL_COMMUNITY): Payer: BLUE CROSS/BLUE SHIELD

## 2015-07-17 DIAGNOSIS — E109 Type 1 diabetes mellitus without complications: Secondary | ICD-10-CM | POA: Diagnosis not present

## 2015-07-19 ENCOUNTER — Encounter: Payer: Self-pay | Admitting: Family

## 2015-07-24 ENCOUNTER — Encounter: Payer: Self-pay | Admitting: Family

## 2015-07-24 ENCOUNTER — Ambulatory Visit (HOSPITAL_COMMUNITY)
Admission: RE | Admit: 2015-07-24 | Discharge: 2015-07-24 | Disposition: A | Discharge status: Home / Self Care | Payer: BLUE CROSS/BLUE SHIELD | Source: Ambulatory Visit | Attending: Family | Admitting: Family

## 2015-07-24 ENCOUNTER — Ambulatory Visit (INDEPENDENT_AMBULATORY_CARE_PROVIDER_SITE_OTHER): Payer: BLUE CROSS/BLUE SHIELD | Admitting: Family

## 2015-07-24 VITALS — BP 132/71 | HR 60 | Ht 69.0 in | Wt 160.9 lb

## 2015-07-24 DIAGNOSIS — M79605 Pain in left leg: Secondary | ICD-10-CM | POA: Diagnosis not present

## 2015-07-24 DIAGNOSIS — Z720 Tobacco use: Secondary | ICD-10-CM

## 2015-07-24 DIAGNOSIS — F17200 Nicotine dependence, unspecified, uncomplicated: Secondary | ICD-10-CM | POA: Diagnosis not present

## 2015-07-24 DIAGNOSIS — I70213 Atherosclerosis of native arteries of extremities with intermittent claudication, bilateral legs: Secondary | ICD-10-CM

## 2015-07-24 DIAGNOSIS — M79604 Pain in right leg: Secondary | ICD-10-CM | POA: Diagnosis not present

## 2015-07-24 DIAGNOSIS — I779 Disorder of arteries and arterioles, unspecified: Secondary | ICD-10-CM | POA: Diagnosis not present

## 2015-07-24 NOTE — Progress Notes (Signed)
[facility]   CC: Follow up intermittent claudication   History of Present Illness Thomas Sandoval. is a 60 y.o. male patient of Dr. Trula Slade is back today for follow-up of his bilateral lower extremity claudication that he first noted in 2015.  He has known bilateral SFA occlusions found on lower extremities duplex in February 2016.  Dr. Trula Slade last saw pt in April of 2016. At that time Dr. Trula Slade his walking distance was more limited by his back than his legs. For that reason, coupled with the fact that he does not have rest pain or nonhealing wounds in his lower extremities, Dr. Trula Slade felt that the best course of action is continued medical management, rather than intervention. Dr. Trula Slade stressed the importance of smoking cessation; that if he were to develop a nonhealing wound or infection on his leg that he would likely require revascularization to prevent limb loss. His symptoms were not significant enough at that time to warrant invasive treatment.Dr. Trula Slade requested the patient follow-up in one year with a repeat ankle-brachial index, and pt is back today for follow up.  He reports pain in both calves after walking about [phone], relieved by rest; he feels this has not changed over time. Pt states he stopped the Pletal as he felt it was not helping; he tried using it for 1-2 months.   The patient was not started on a statin because of his low LDL.  Pt denies any known history of stroke or TIA. He denies any known cardiac problems other than 2 known episodes of afib; pt's wife states his cardiologist advised that he take Eliquis in the Fall of 2016 but pt declined.    Pt Diabetic: Yes, was diagnosed at age 20, A1C in February 2017 was 6.3, creatinine was normal at 0.89 (review of records). His wife indicates that he has maintained good control of his DM, has no peripheral neuropathy Pt smoker: smoker  (2 ppd, started smoking at age 71  yrs)  Pt meds include: Statin :No Betablocker: No ASA: Yes, 81 mg Other anticoagulants/antiplatelets: no  Past Medical History  Diagnosis Date  . GERD (gastroesophageal reflux disease)   . Type 1 diabetes mellitus ([place]) 1977  . History of hemolytic anemia 02/2011    secondary to Avelox  . History of atrial flutter 02/2011    Converted to NSR with Cardizem  . COPD (chronic obstructive pulmonary disease) ([place])   . History of chronic bronchitis   . Diabetic retinopathy of both eyes ([place])   . Smokers' cough ([place])   . Productive cough   . Arthritis   . DDD (degenerative disc disease)   . HOH (hard of hearing)   . Chronic back pain   . Bladder cancer ([place])   . HOH (hard of hearing)     no eardrum and nerve damage on R, also HOH on L  . PAD (peripheral artery disease) ([place]) 04/2014    Dr Trula Slade; bilateral SFA occlusion, R mid, L distal  . PAF (paroxysmal atrial fibrillation) ([place])     a. Dx 11/2014 during admission for perf ulcer.  . Perforated ulcer ([place])     a. 11/2014 s/p surgery.  . Mitral valve prolapse     a. 2D Echo 11/27/14: EF 55-60%; images were inadequate for LV wall motion assessment, + mild late systolic mitral valve prolapse involving the anterior leaflet.  . Essential hypertension     Social History Social History  Substance Use  Topics  . Smoking status: Current Every Day Smoker -- 2.00 packs/day for 30 years    Types: Cigarettes  . Smokeless tobacco: Former Systems developer    Quit date: 06/08/1978  . Alcohol Use: No    Family History Family History  Problem Relation Age of Onset  . Breast cancer Mother   . Cancer Mother     Breast  . Rheumatic fever Father   . Heart disease Father   . Heart attack Father     Massive   . Diabetes Son     Past Surgical History  Procedure Laterality Date  . Esophagogastroduodenoscopy N/A 02/06/2013    Procedure: ESOPHAGOGASTRODUODENOSCOPY (EGD);  Surgeon: Lafayette Dragon, MD;  [facility] END[facility]COPY;  Service: Endoscopy;   Laterality: N/A;  . Transthoracic echocardiogram  02-17-2011    MODERATE LVH/  EF 65%  . Tonsillectomy  as child  . Tympanic membrane repair  as child  . Transurethral resection of bladder tumor with gyrus (turbt-gyrus) N/A 06/12/2013    Procedure: TRANSURETHRAL RESECTION OF BLADDER TUMOR WITH GYRUS (TURBT-GYRUS);  Surgeon: Claybon Jabs, MD;  Location: [facility];  Service: Urology;  Laterality: N/A;  . Cystoscopy with ureteroscopy Right 08/14/2013    Procedure: CYSTOSCOPY WITH URETEROSCOPY BLADDER BIOPSY ;  Surgeon: Claybon Jabs, MD;  [facility];  Service: Urology;  Laterality: Right;  . Laparoscopy N/A 11/25/2014    Procedure: LAPAROSCOPIC PRIMARY REPAIR OF PERFORATED PREPYLORIC ULCER WITH Silvestre Gunner;  Surgeon: Greer Pickerel, MD;  Location: [facility];  Service: General;  Laterality: N/A;    Allergies  Allergen Reactions  . Zithromax [Azithromycin] Other (See Comments)    "Severe stomach cramps; told to list as an allergy by dr. Years ago"  . Avelox [Moxifloxacin Hcl In Nacl] Other (See Comments)    Hemolysis  In 2012  . Bactrim [Sulfamethoxazole-Trimethoprim] Diarrhea and Nausea And Vomiting    Current Outpatient Prescriptions  Medication Sig Dispense Refill  . albuterol (PROVENTIL HFA;VENTOLIN HFA) 108 (90 Base) MCG/ACT inhaler Inhale 2 puffs into the lungs every 6 (six) hours as needed for wheezing or shortness of breath. 1 Inhaler 2  . aspirin 81 MG tablet Take 81 mg by mouth daily.    . famotidine (PEPCID) 20 MG tablet Take 1 tablet (20 mg total) by mouth every morning. Reported on 04/16/2015 30 tablet 5  . Fluticasone-Salmeterol (ADVAIR DISKUS) 250-50 MCG/DOSE AEPB INHALE 1 PUFF INTO THE LUNGS EVERY EVENING. 60 each 5  . HYDROcodone-acetaminophen (NORCO/VICODIN) 5-325 MG per tablet Take 1-2 tablets by mouth every 4 (four) hours as needed for moderate pain or severe pain. 50 tablet 0  . insulin aspart (NOVOLOG) 100 UNIT/ML injection PER SLIDING  SCALE: 190 - 200 = 2 units 300 and above = 7 units 10 mL 5  . insulin glargine (LANTUS) 100 UNIT/ML injection INJECT 40 UNITS SUBCUTANEOUSLY EVERY MORNING 20 mL 5  . lisinopril (PRINIVIL,ZESTRIL) 5 MG tablet Take 1 tablet (5 mg total) by mouth daily. 30 tablet 5  . loratadine (CLARITIN) 10 MG tablet Take 10 mg by mouth at bedtime.       No current facility-administered medications for this visit.    ROS: See HPI for pertinent positives and negatives.   Physical Examination  Filed Vitals:   07/24/15 1201 07/24/15 1215  BP: 148/67 132/71  Pulse: 60   Height: 5\' 9"  (1.753 m)   Weight: 160 lb 14.4 oz (72.984 kg)   SpO2: 97%    Body mass index  is 23.75 kg/(m^2).  General: A&O x 3, WDWN. Gait: normal Eyes: PERRLA. Pulmonary: CTAB, without wheezes , rales or rhonchi. Cardiac: regular rhythm, no detected murmur.         Carotid Bruits Right Left   Negative Negative  Aorta is not palpable. Radial pulses: 2+ palpable and =                           VASCULAR EXAM: Extremities no ischemic changes, no Gangrene; no open wounds.                                                                                                          LE Pulses Right Left       FEMORAL   palpable  palpable        POPLITEAL  not palpable   not palpable       POSTERIOR TIBIAL  not palpable   not palpable        DORSALIS PEDIS      ANTERIOR TIBIAL not palpable  not palpable    Abdomen: soft, NT, no palpable masses. Skin: no rashes, no ulcers. Musculoskeletal: no muscle wasting or atrophy.  Neurologic: A&O X 3; Appropriate Affect ; SENSATION: normal; MOTOR FUNCTION:  moving all extremities equally, motor strength 5/5 throughout. Speech is fluent/normal.  CN 2-12 intact except is hard of hearing.    Non-Invasive Vascular Imaging: DATE: 07/24/2015 ABI: RIGHT: 0.52 (0.65, 04/02/14), Waveforms: monophasic, TBI: 0.40;  LEFT: 0.44 (0.65), Waveforms: monophasic, TBI: 0.28   ASSESSMENT: Thomas Sandoval. is a 60 y.o. male who has moderate bilateral lower extremity intermittent claudication that is not life limiting. He has no signs of ischemia in his feet/legs.  He has known bilateral SFA occlusions found on lower extremities duplex in February 2016. Pt states he tried Pletal for 1-2 months and states no benefit.  Today's ABI's suggest moderate arterial occlusive disease in the right LE and severe in the left, all monophasic waveforms. Arterial perfusion has diminished in both legs compared to January 2016.  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 He apparently has maintained good control of his DM that he has had since age 8, as self reported and as evidenced by no peripheral neuropathy symptoms and normal renal function as of February 2017. His primary atherosclerotic risk factor is his 2 ppd smoking.     PLAN:  The patient was counseled re smoking cessation and given several free resources re smoking cessation. Continued extensive walking.   Based on the patient's vascular studies and examination, pt will return to clinic in 6 months with ABI's.   I discussed in depth with the patient the nature of atherosclerosis, and emphasized the importance of maximal medical management including strict control of blood pressure, blood glucose, and lipid levels, obtaining regular exercise, and cessation of smoking.  The patient is aware that without maximal medical management the underlying atherosclerotic disease process will progress, limiting the benefit of any interventions.  The patient was given information  about PAD including signs, symptoms, treatment, what symptoms should prompt the patient to seek immediate medical care, and risk reduction measures to take.  Clemon Chambers, RN, MSN, FNP-C [facility] Phone: [phone]  Clinic MD: Scot Dock  07/24/2015 12:29 PM

## 2015-07-24 NOTE — Patient Instructions (Addendum)
Intermittent Claudication Intermittent claudication is pain in your leg that occurs when you walk or exercise and goes away when you rest. The pain can occur in one or both legs. CAUSES Intermittent claudication is caused by the buildup of plaque within the major arteries in the body (atherosclerosis). The plaque, which makes arteries stiff and narrow, prevents enough blood from reaching your leg muscles. The pain occurs when you walk or exercise because your muscles need more blood when you are moving and exercising. RISK FACTORS Risk factors include:  A family history of atherosclerosis.  A personal history of stroke or heart disease.  Older age.  Being inactive or overweight.  Smoking cigarettes.  Having another health condition such as:  Diabetes.  High blood pressure.  High cholesterol. SIGNS AND SYMPTOMS  Your hip or leg may:   Ache.  Cramp.  Feel tight.  Feel weak.  Feel heavy. Over time, you may feel pain in your calf, thigh, or hip. DIAGNOSIS  Your health care provider may diagnose intermittent claudication based on your symptoms and medical history. Your health care provider may also do tests to learn more about your condition. These may include:  Blood tests.  An ultrasound.  Imaging tests such as angiography, magnetic resonance angiography (MRA), and computed tomography angiography (CTA). TREATMENT You may be treated for problems such as:  High blood pressure.  High cholesterol.  Diabetes. Other treatments may include:  Lifestyle changes such as:  Starting an exercise program.  Losing weight.  Quitting smoking.  Medicines to help restore blood flow through your legs.  Blood vessel surgery (angioplasty) to restore blood flow if your intermittent claudication is caused by severe peripheral artery disease. HOME CARE INSTRUCTIONS  Manage any other health conditions you have.  Eat a diet low in saturated fats and calories to maintain a  healthy weight.  Quit smoking, if you smoke.  Take medicines only as directed by your health care provider.  If your health care provider recommended an exercise program for you, follow it as directed. Your exercise program may involve:  Walking three or more times a week.  Walking until you have certain symptoms of intermittent claudication.  Resting until symptoms go away.  Gradually increasing walking time to about 50 minutes a day. SEEK MEDICAL CARE IF: Your condition is not getting better or is getting worse. SEEK IMMEDIATE MEDICAL CARE IF:   You have chest pain.  You have difficulty breathing.  You develop arm weakness.  You have trouble speaking.  Your face begins to droop. MAKE SURE YOU:  Understand these instructions.  Will watch your condition.  Will get help if you are not doing well or get worse.   This information is not intended to replace advice given to you by your health care provider. Make sure you discuss any questions you have with your health care provider.   Document Released: 12/27/2003 Document Revised: 03/16/2014 Document Reviewed: 06/01/2013 Elsevier Interactive Patient Education 2016 [employer] Inc[employer]     Steps to Quit Smoking  Smoking tobacco can be harmful to your health and can affect almost every organ in your body. Smoking puts you, and those around you, at risk for developing many serious chronic diseases. Quitting smoking is difficult, but it is one of the best things that you can do for your health. It is never too late to quit. WHAT ARE THE BENEFITS OF QUITTING SMOKING? When you quit smoking, you lower your risk of developing serious diseases and conditions, such as:    Lung cancer or lung disease, such as COPD.  Heart disease.  Stroke.  Heart attack.  Infertility.  Osteoporosis and bone fractures. Additionally, symptoms such as coughing, wheezing, and shortness of breath may get better when you quit. You may also find that  you get sick less often because your body is stronger at fighting off colds and infections. If you are pregnant, quitting smoking can help to reduce your chances of having a baby of low birth weight. HOW DO I GET READY TO QUIT? When you decide to quit smoking, create a plan to make sure that you are successful. Before you quit:  Pick a date to quit. Set a date within the next two weeks to give you time to prepare.  Write down the reasons why you are quitting. Keep this list in places where you will see it often, such as on your bathroom mirror or in your car or wallet.  Identify the people, places, things, and activities that make you want to smoke (triggers) and avoid them. Make sure to take these actions:  Throw away all cigarettes at home, at work, and in your car.  Throw away smoking accessories, such as ashtrays and lighters.  Clean your car and make sure to empty the ashtray.  Clean your home, including curtains and carpets.  Tell your family, friends, and coworkers that you are quitting. Support from your loved ones can make quitting easier.  Talk with your health care provider about your options for quitting smoking.  Find out what treatment options are covered by your health insurance. WHAT STRATEGIES CAN I USE TO QUIT SMOKING?  Talk with your healthcare provider about different strategies to quit smoking. Some strategies include:  Quitting smoking altogether instead of gradually lessening how much you smoke over a period of time. Research shows that quitting "cold turkey" is more successful than gradually quitting.  Attending in-person counseling to help you build problem-solving skills. You are more likely to have success in quitting if you attend several counseling sessions. Even short sessions of 10 minutes can be effective.  Finding resources and support systems that can help you to quit smoking and remain smoke-free after you quit. These resources are most helpful when  you use them often. They can include:  Online chats with a counselor.  Telephone quitlines.  Printed self-help materials.  Support groups or group counseling.  Text messaging programs.  Mobile phone applications.  Taking medicines to help you quit smoking. (If you are pregnant or breastfeeding, talk with your health care provider first.) Some medicines contain nicotine and some do not. Both types of medicines help with cravings, but the medicines that include nicotine help to relieve withdrawal symptoms. Your health care provider may recommend:  Nicotine patches, gum, or lozenges.  Nicotine inhalers or sprays.  Non-nicotine medicine that is taken by mouth. Talk with your health care provider about combining strategies, such as taking medicines while you are also receiving in-person counseling. Using these two strategies together makes you more likely to succeed in quitting than if you used either strategy on its own. If you are pregnant or breastfeeding, talk with your health care provider about finding counseling or other support strategies to quit smoking. Do not take medicine to help you quit smoking unless told to do so by your health care provider. WHAT THINGS CAN I DO TO MAKE IT EASIER TO QUIT? Quitting smoking might feel overwhelming at first, but there is a lot that you can do to make   it easier. Take these important actions:  Reach out to your family and friends and ask that they support and encourage you during this time. Call telephone quitlines, reach out to support groups, or work with a counselor for support.  Ask people who smoke to avoid smoking around you.  Avoid places that trigger you to smoke, such as bars, parties, or smoke-break areas at work.  Spend time around people who do not smoke.  Lessen stress in your life, because stress can be a smoking trigger for some people. To lessen stress, try:  Exercising regularly.  Deep-breathing  exercises.  Yoga.  Meditating.  Performing a body scan. This involves closing your eyes, scanning your body from head to toe, and noticing which parts of your body are particularly tense. Purposefully relax the muscles in those areas.  Download or purchase mobile phone or tablet apps (applications) that can help you stick to your quit plan by providing reminders, tips, and encouragement. There are many free apps, such as QuitGuide from the CDC (Centers for Disease Control and Prevention). You can find other support for quitting smoking (smoking cessation) through smokefree.gov and other websites. HOW WILL I FEEL WHEN I QUIT SMOKING? Within the first 24 hours of quitting smoking, you may start to feel some withdrawal symptoms. These symptoms are usually most noticeable 2-3 days after quitting, but they usually do not last beyond 2-3 weeks. Changes or symptoms that you might experience include:  Mood swings.  Restlessness, anxiety, or irritation.  Difficulty concentrating.  Dizziness.  Strong cravings for sugary foods in addition to nicotine.  Mild weight gain.  Constipation.  Nausea.  Coughing or a sore throat.  Changes in how your medicines work in your body.  A depressed mood.  Difficulty sleeping (insomnia). After the first 2-3 weeks of quitting, you may start to notice more positive results, such as:  Improved sense of smell and taste.  Decreased coughing and sore throat.  Slower heart rate.  Lower blood pressure.  Clearer skin.  The ability to breathe more easily.  Fewer sick days. Quitting smoking is very challenging for most people. Do not get discouraged if you are not successful the first time. Some people need to make many attempts to quit before they achieve long-term success. Do your best to stick to your quit plan, and talk with your health care provider if you have any questions or concerns.   This information is not intended to replace advice given to  you by your health care provider. Make sure you discuss any questions you have with your health care provider.   Document Released: 02/17/2001 Document Revised: 07/10/2014 Document Reviewed: 07/10/2014 Elsevier Interactive Patient Education 2016 [employer] Inc.  

## 2015-10-08 ENCOUNTER — Other Ambulatory Visit: Payer: Self-pay | Admitting: Family

## 2015-10-08 DIAGNOSIS — I70213 Atherosclerosis of native arteries of extremities with intermittent claudication, bilateral legs: Secondary | ICD-10-CM

## 2015-10-16 DIAGNOSIS — E109 Type 1 diabetes mellitus without complications: Secondary | ICD-10-CM | POA: Diagnosis not present

## 2015-10-17 DIAGNOSIS — E109 Type 1 diabetes mellitus without complications: Secondary | ICD-10-CM | POA: Diagnosis not present

## 2015-10-22 DIAGNOSIS — E109 Type 1 diabetes mellitus without complications: Secondary | ICD-10-CM | POA: Diagnosis not present

## 2015-11-05 DIAGNOSIS — Z8551 Personal history of malignant neoplasm of bladder: Secondary | ICD-10-CM | POA: Diagnosis not present

## 2015-11-05 DIAGNOSIS — K251 Acute gastric ulcer with perforation: Secondary | ICD-10-CM | POA: Diagnosis not present

## 2015-11-26 ENCOUNTER — Other Ambulatory Visit: Payer: Self-pay | Admitting: Nurse Practitioner

## 2015-11-26 DIAGNOSIS — N182 Chronic kidney disease, stage 2 (mild): Principal | ICD-10-CM

## 2015-11-26 DIAGNOSIS — Z794 Long term (current) use of insulin: Principal | ICD-10-CM

## 2015-11-26 DIAGNOSIS — E1122 Type 2 diabetes mellitus with diabetic chronic kidney disease: Secondary | ICD-10-CM

## 2015-11-28 ENCOUNTER — Telehealth: Payer: Self-pay | Admitting: Nurse Practitioner

## 2015-11-28 NOTE — Telephone Encounter (Signed)
Last refill without being seen 

## 2015-11-28 NOTE — Telephone Encounter (Signed)
Patient aware that he is not allergic to amoxicillin according to his chart.

## 2015-12-25 ENCOUNTER — Other Ambulatory Visit: Payer: Self-pay | Admitting: Nurse Practitioner

## 2015-12-25 DIAGNOSIS — E1122 Type 2 diabetes mellitus with diabetic chronic kidney disease: Secondary | ICD-10-CM

## 2015-12-25 DIAGNOSIS — N182 Chronic kidney disease, stage 2 (mild): Principal | ICD-10-CM

## 2015-12-25 DIAGNOSIS — Z794 Long term (current) use of insulin: Principal | ICD-10-CM

## 2015-12-25 NOTE — Telephone Encounter (Signed)
Last seen 02/17

## 2015-12-26 NOTE — Telephone Encounter (Signed)
LMOVM that pt NTBS before next refill

## 2015-12-26 NOTE — Telephone Encounter (Signed)
Last refill without being seen 

## 2016-01-08 ENCOUNTER — Other Ambulatory Visit: Payer: Self-pay | Admitting: Nurse Practitioner

## 2016-01-08 DIAGNOSIS — I10 Essential (primary) hypertension: Secondary | ICD-10-CM

## 2016-01-10 ENCOUNTER — Encounter: Payer: Self-pay | Admitting: General Surgery

## 2016-02-10 ENCOUNTER — Telehealth: Payer: Self-pay | Admitting: Nurse Practitioner

## 2016-02-10 ENCOUNTER — Other Ambulatory Visit: Payer: Self-pay | Admitting: Family Medicine

## 2016-02-10 DIAGNOSIS — I10 Essential (primary) hypertension: Secondary | ICD-10-CM

## 2016-02-10 NOTE — Telephone Encounter (Signed)
last seen 04/2015

## 2016-02-11 ENCOUNTER — Other Ambulatory Visit: Payer: Self-pay | Admitting: Nurse Practitioner

## 2016-02-11 DIAGNOSIS — N182 Chronic kidney disease, stage 2 (mild): Principal | ICD-10-CM

## 2016-02-11 DIAGNOSIS — Z794 Long term (current) use of insulin: Principal | ICD-10-CM

## 2016-02-11 DIAGNOSIS — E1122 Type 2 diabetes mellitus with diabetic chronic kidney disease: Secondary | ICD-10-CM

## 2016-02-11 MED ORDER — INSULIN GLARGINE 100 UNIT/ML ~~LOC~~ SOLN: SUBCUTANEOUS | 0 refills | Status: DC

## 2016-02-11 NOTE — Progress Notes (Signed)
Last refill without being seen more refills on insulin without being seen- has not been seen since February

## 2016-02-17 ENCOUNTER — Ambulatory Visit (INDEPENDENT_AMBULATORY_CARE_PROVIDER_SITE_OTHER): Payer: BLUE CROSS/BLUE SHIELD | Admitting: Family

## 2016-02-17 ENCOUNTER — Encounter: Payer: Self-pay | Admitting: Family

## 2016-02-17 VITALS — BP 150/76 | HR 71 | Temp 96.6°F | Ht 69.0 in | Wt 167.2 lb

## 2016-02-17 DIAGNOSIS — J209 Acute bronchitis, unspecified: Secondary | ICD-10-CM

## 2016-02-17 MED ORDER — DOXYCYCLINE HYCLATE 100 MG PO TABS: 100.0000 mg | ORAL_TABLET | Freq: Two times a day (BID) | ORAL | 0 refills | Status: DC

## 2016-02-17 MED ORDER — PREDNISONE 10 MG (21) PO TBPK: ORAL_TABLET | ORAL | 0 refills | Status: DC

## 2016-02-17 NOTE — Progress Notes (Addendum)
Subjective:    Patient ID: Thomas Medici., male    DOB: 04/15/1955, 60 y.o.   MRN: [id number]  Cough  This is a new problem. The current episode started in the past 7 days. The problem has been gradually worsening. The problem occurs every few minutes. The cough is productive of sputum. Associated symptoms include myalgias, nasal congestion, postnasal drip, rhinorrhea, a sore throat and shortness of breath. Pertinent negatives include no chills, ear congestion, ear pain, fever, headaches or wheezing. The symptoms are aggravated by lying down. Risk factors for lung disease include smoking/tobacco exposure. He has tried rest and OTC cough suppressant for the symptoms. The treatment provided mild relief. His past medical history is significant for COPD. There is no history of asthma.  Sore Throat   Associated symptoms include coughing and shortness of breath. Pertinent negatives include no ear pain or headaches.      Review of Systems  Constitutional: Negative for chills and fever.  HENT: Positive for postnasal drip, rhinorrhea and sore throat. Negative for ear pain.   Respiratory: Positive for cough and shortness of breath. Negative for wheezing.   Musculoskeletal: Positive for myalgias.  Neurological: Negative for headaches.  All other systems reviewed and are negative.      Objective:   Physical Exam  Constitutional: He is oriented to person, place, and time. He appears well-developed and well-nourished. No distress.  HENT:  Head: Normocephalic.  Right Ear: External ear normal. Tympanic membrane is erythematous.  Left Ear: External ear normal.  Nose: Mucosal edema and rhinorrhea present.  Mouth/Throat: Posterior oropharyngeal erythema present.  Hoarse voice   Eyes: Pupils are equal, round, and reactive to light. Right eye exhibits no discharge. Left eye exhibits no discharge.  Neck: Normal range of motion. Neck supple. No thyromegaly present.  Cardiovascular: Normal rate,  regular rhythm, normal heart sounds and intact distal pulses.   No murmur heard. Pulmonary/Chest: Effort normal. No respiratory distress. He has decreased breath sounds. He has no wheezes.  Intermittent cough   Abdominal: Soft. Bowel sounds are normal. He exhibits no distension. There is no tenderness.  Musculoskeletal: Normal range of motion. He exhibits no edema or tenderness.  Neurological: He is alert and oriented to person, place, and time.  Skin: Skin is warm and dry. No rash noted. No erythema.  Psychiatric: He has a normal mood and affect. His behavior is normal. Judgment and thought content normal.  Vitals reviewed.      BP (!) 150/76   Pulse 71   Temp (!) 96.6 F (35.9 C) (Oral)   Ht 5\' 9"  (1.753 m)   Wt 167 lb 3.2 oz (75.8 kg)   BMI 24.69 kg/m      Assessment & Plan:  1. Acute bronchitis, unspecified organism - Take meds as prescribed - Use a cool mist humidifier  -Use saline nose sprays frequently -Saline irrigations of the nose can be very helpful if done frequently.  * 4X daily for 1 week*  * Use of a nettie pot can be helpful with this. Follow directions with this* -Force fluids -For any cough or congestion  Use plain Mucinex- regular strength or max strength is fine   * Children- consult with Pharmacist for dosing -For fever or aces or pains- take tylenol or ibuprofen appropriate for age and weight.  * for fevers greater than 101 orally you may alternate ibuprofen and tylenol every  3 hours. -Throat lozenges if help -New toothbrush in 3 days - predniSONE (STERAPRED  UNI-PAK 21 TAB) 10 MG (21) TBPK tablet; Use as directed  Dispense: 21 tablet; Refill: 0  Pt states he can not tolerate prednisone and does not want it. Requesting antibiotic. Will given rx of doxycycline. Pt to hold off and start in two days if symptoms do not improve or become worse.   Evelina Dun, FNP

## 2016-02-17 NOTE — Patient Instructions (Signed)

## 2016-02-17 NOTE — Addendum Note (Signed)
Addended by: Evelina Dun A on: 02/17/2016 11:23 AM   Modules accepted: Orders

## 2016-02-18 ENCOUNTER — Encounter: Payer: Self-pay | Admitting: Family

## 2016-02-24 ENCOUNTER — Ambulatory Visit: Payer: BLUE CROSS/BLUE SHIELD | Admitting: Family

## 2016-02-24 ENCOUNTER — Encounter (HOSPITAL_COMMUNITY): Payer: BLUE CROSS/BLUE SHIELD

## 2016-02-27 ENCOUNTER — Encounter: Payer: Self-pay | Admitting: Family

## 2016-02-27 ENCOUNTER — Ambulatory Visit (INDEPENDENT_AMBULATORY_CARE_PROVIDER_SITE_OTHER): Payer: BLUE CROSS/BLUE SHIELD | Admitting: Family

## 2016-02-27 ENCOUNTER — Ambulatory Visit (HOSPITAL_COMMUNITY)
Admission: RE | Admit: 2016-02-27 | Discharge: 2016-02-27 | Disposition: A | Discharge status: Home / Self Care | Payer: BLUE CROSS/BLUE SHIELD | Source: Ambulatory Visit | Attending: Family | Admitting: Family

## 2016-02-27 VITALS — BP 116/70 | HR 73 | Temp 98.3°F | Resp 20 | Ht 69.0 in | Wt 167.0 lb

## 2016-02-27 DIAGNOSIS — F17200 Nicotine dependence, unspecified, uncomplicated: Secondary | ICD-10-CM

## 2016-02-27 DIAGNOSIS — R0989 Other specified symptoms and signs involving the circulatory and respiratory systems: Secondary | ICD-10-CM | POA: Diagnosis not present

## 2016-02-27 DIAGNOSIS — I70213 Atherosclerosis of native arteries of extremities with intermittent claudication, bilateral legs: Secondary | ICD-10-CM

## 2016-02-27 DIAGNOSIS — R938 Abnormal findings on diagnostic imaging of other specified body structures: Secondary | ICD-10-CM | POA: Diagnosis not present

## 2016-02-27 NOTE — Patient Instructions (Signed)
Peripheral Vascular Disease Peripheral vascular disease (PVD) is a disease of the blood vessels that are not part of your heart and brain. A simple term for PVD is poor circulation. In most cases, PVD narrows the blood vessels that carry blood from your heart to the rest of your body. This can result in a decreased supply of blood to your arms, legs, and internal organs, like your stomach or kidneys. However, it most often affects a person's lower legs and feet. There are two types of PVD.  Organic PVD. This is the more common type. It is caused by damage to the structure of blood vessels.  Functional PVD. This is caused by conditions that make blood vessels contract and tighten (spasm). Without treatment, PVD tends to get worse over time. PVD can also lead to acute ischemic limb. This is when an arm or limb suddenly has trouble getting enough blood. This is a medical emergency. Follow these instructions at home:  Take medicines only as told by your doctor.  Do not use any tobacco products, including cigarettes, chewing tobacco, or electronic cigarettes. If you need help quitting, ask your doctor.  Lose weight if you are overweight, and maintain a healthy weight as told by your doctor.  Eat a diet that is low in fat and cholesterol. If you need help, ask your doctor.  Exercise regularly. Ask your doctor for some good activities for you.  Take good care of your feet.  Wear comfortable shoes that fit well.  Check your feet often for any cuts or sores. Contact a doctor if:  You have cramps in your legs while walking.  You have leg pain when you are at rest.  You have coldness in a leg or foot.  Your skin changes.  You are unable to get or have an erection (erectile dysfunction).  You have cuts or sores on your feet that are not healing. Get help right away if:  Your arm or leg turns cold and blue.  Your arms or legs become red, warm, swollen, painful, or numb.  You have  chest pain or trouble breathing.  You suddenly have weakness in your face, arm, or leg.  You become very confused or you cannot speak.  You suddenly have a very bad headache.  You suddenly cannot see. This information is not intended to replace advice given to you by your health care provider. Make sure you discuss any questions you have with your health care provider. Document Released: 05/20/2009 Document Revised: 08/01/2015 Document Reviewed: 08/03/2013 Elsevier Interactive Patient Education  2017 [employer] Inc.      Steps to Quit Smoking Smoking tobacco can be bad for your health. It can also affect almost every organ in your body. Smoking puts you and people around you at risk for many serious long-lasting (chronic) diseases. Quitting smoking is hard, but it is one of the best things that you can do for your health. It is never too late to quit. What are the benefits of quitting smoking? When you quit smoking, you lower your risk for getting serious diseases and conditions. They can include:  Lung cancer or lung disease.  Heart disease.  Stroke.  Heart attack.  Not being able to have children (infertility).  Weak bones (osteoporosis) and broken bones (fractures). If you have coughing, wheezing, and shortness of breath, those symptoms may get better when you quit. You may also get sick less often. If you are pregnant, quitting smoking can help to lower your chances   of having a baby of low birth weight. What can I do to help me quit smoking? Talk with your doctor about what can help you quit smoking. Some things you can do (strategies) include:  Quitting smoking totally, instead of slowly cutting back how much you smoke over a period of time.  Going to in-person counseling. You are more likely to quit if you go to many counseling sessions.  Using resources and support systems, such as:  Online chats with a counselor.  Phone quitlines.  Printed self-help  materials.  Support groups or group counseling.  Text messaging programs.  Mobile phone apps or applications.  Taking medicines. Some of these medicines may have nicotine in them. If you are pregnant or breastfeeding, do not take any medicines to quit smoking unless your doctor says it is okay. Talk with your doctor about counseling or other things that can help you. Talk with your doctor about using more than one strategy at the same time, such as taking medicines while you are also going to in-person counseling. This can help make quitting easier. What things can I do to make it easier to quit? Quitting smoking might feel very hard at first, but there is a lot that you can do to make it easier. Take these steps:  Talk to your family and friends. Ask them to support and encourage you.  Call phone quitlines, reach out to support groups, or work with a counselor.  Ask people who smoke to not smoke around you.  Avoid places that make you want (trigger) to smoke, such as:  Bars.  Parties.  Smoke-break areas at work.  Spend time with people who do not smoke.  Lower the stress in your life. Stress can make you want to smoke. Try these things to help your stress:  Getting regular exercise.  Deep-breathing exercises.  Yoga.  Meditating.  Doing a body scan. To do this, close your eyes, focus on one area of your body at a time from head to toe, and notice which parts of your body are tense. Try to relax the muscles in those areas.  Download or buy apps on your mobile phone or tablet that can help you stick to your quit plan. There are many free apps, such as QuitGuide from the CDC (Centers for Disease Control and Prevention). You can find more support from smokefree.gov and other websites. This information is not intended to replace advice given to you by your health care provider. Make sure you discuss any questions you have with your health care provider. Document Released:  12/20/2008 Document Revised: 10/22/2015 Document Reviewed: 07/10/2014 Elsevier Interactive Patient Education  2017 [employer] Inc.  

## 2016-02-27 NOTE — Progress Notes (Signed)
[facility] [place]   CC: Follow up peripheral artery occlusive disease  History of Present Illness Thomas Sandoval. is a 60 y.o. male patient of Dr. Trula Slade is back today for follow-up of his bilateral lower extremity claudication that he first noted in 2015.  He has known bilateral SFA occlusions found on lower extremities duplex in February 2016.  Dr. Trula Slade last saw pt in April of 2016. At that time Dr. Trula Slade his walking distance was more limited by his back than his legs. For that reason, coupled with the fact that he does not have rest pain or nonhealing wounds in his lower extremities, Dr. Trula Slade felt that the best course of action is continued medical management, rather than intervention. Dr. Trula Slade stressed the importance of smoking cessation; that if he were to develop a nonhealing wound or infection on his leg that he would likely require revascularization to prevent limb loss. His symptoms were not significant enough at that time to warrant invasive treatment.Dr. Trula Slade requested the patient follow-up in one year with a repeat ankle-brachial index, and pt is back today for follow up.  He reports pain in both calves after walking about [phone] feet, relieved by rest; he feels this has not changed over time. 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   The patient was not started on a statin because of his low LDL.  Pt denies any known history of stroke or TIA. He denies any known cardiac problems other than 2 known episodes of afib; pt's wife states his cardiologist advised that he take Eliquis in the Fall of 2016 but pt declined.    Pt Diabetic: Yes, was diagnosed at age 25, A1C in February 2017 was 6.3, creatinine was normal at 0.89 (review of records). His wife indicates that he has maintained good  control of his DM, has no peripheral neuropathy Pt smoker: smoker  (2 ppd, started smoking at age 40 yrs)  Pt meds include: Statin :No Betablocker: No ASA: Yes, 81 mg Other anticoagulants/antiplatelets: no    Past Medical History:  Diagnosis Date  . Arthritis   . Bladder cancer ([place])   . Chronic back pain   . COPD (chronic obstructive pulmonary disease) ([place])   . DDD (degenerative disc disease)   . Diabetic retinopathy of both eyes ([place])   . Essential hypertension   . GERD (gastroesophageal reflux disease)   . History of atrial flutter 02/2011   Converted to NSR with Cardizem  . History of chronic bronchitis   . History of hemolytic anemia 02/2011   secondary to Avelox  . HOH (hard of hearing)   . HOH (hard of hearing)    no eardrum and nerve damage on R, also HOH on L  . Mitral valve prolapse    a. 2D Echo 11/27/14: EF 55-60%; images were inadequate for LV wall motion assessment, + mild late systolic mitral valve prolapse involving the anterior leaflet.  Marland Kitchen PAD (peripheral artery disease) ([place]) 04/2014   Dr Trula Slade; bilateral SFA occlusion, R mid, L distal  . PAF (paroxysmal atrial fibrillation) ([place])    a. Dx 11/2014 during admission for perf ulcer.  . Perforated ulcer ([place])    a. 11/2014 s/p surgery.  . Productive cough   . Smokers' cough ([place])   . Type 1 diabetes  mellitus ([place]) 1977    Social History Social History  Substance Use Topics  . Smoking status: Current Every Day Smoker    Packs/day: 2.00    Years: 30.00    Types: Cigarettes  . Smokeless tobacco: Former Systems developer    Quit date: 06/08/1978  . Alcohol use No    Family History Family History  Problem Relation Age of Onset  . Breast cancer Mother   . Cancer Mother     Breast  . Rheumatic fever Father   . Heart disease Father   . Heart attack Father     Massive   . Diabetes Son     Past Surgical History:  Procedure Laterality Date  . CYSTOSCOPY WITH URETEROSCOPY Right 08/14/2013   Procedure:  CYSTOSCOPY WITH URETEROSCOPY BLADDER BIOPSY ;  Surgeon: Claybon Jabs, MD;  Location: [facility];  Service: Urology;  Laterality: Right;  . ESOPHAGOGASTRODUODENOSCOPY N/A 02/06/2013   Procedure: ESOPHAGOGASTRODUODENOSCOPY (EGD);  Surgeon: Lafayette Dragon, MD;  Location: [facility] ENDOSCOPY;  Service: Endoscopy;  Laterality: N/A;  . LAPAROSCOPY N/A 11/25/2014   Procedure: LAPAROSCOPIC PRIMARY REPAIR OF PERFORATED PREPYLORIC ULCER WITH Silvestre Gunner;  Surgeon: Greer Pickerel, MD;  Location: [facility];  Service: General;  Laterality: N/A;  . TONSILLECTOMY  as child  . TRANSTHORACIC ECHOCARDIOGRAM  02-17-2011   MODERATE LVH/  EF 65%  . TRANSURETHRAL RESECTION OF BLADDER TUMOR WITH GYRUS (TURBT-GYRUS) N/A 06/12/2013   Procedure: TRANSURETHRAL RESECTION OF BLADDER TUMOR WITH GYRUS (TURBT-GYRUS);  Surgeon: Claybon Jabs, MD;  Location[facility];  Service: Urology;  Laterality: N/A;  . TYMPANIC MEMBRANE REPAIR  as child    Allergies  Allergen Reactions  . Zithromax [Azithromycin] Other (See Comments)    "Severe stomach cramps; told to list as an allergy by dr. Years ago"  . Avelox [Moxifloxacin Hcl In Nacl] Other (See Comments)    Hemolysis  In 2012  . Bactrim [Sulfamethoxazole-Trimethoprim] Diarrhea and Nausea And Vomiting    Current Outpatient Prescriptions  Medication Sig Dispense Refill  . albuterol (PROVENTIL HFA;VENTOLIN HFA) 108 (90 Base) MCG/ACT inhaler Inhale 2 puffs into the lungs every 6 (six) hours as needed for wheezing or shortness of breath. 1 Inhaler 2  . aspirin 81 MG tablet Take 81 mg by mouth daily.    Marland Kitchen doxycycline (VIBRA-TABS) 100 MG tablet Take 1 tablet (100 mg total) by mouth 2 (two) times daily. 20 tablet 0  . famotidine (PEPCID) 20 MG tablet Take 1 tablet (20 mg total) by mouth every morning. Reported on 04/16/2015 30 tablet 5  . Fluticasone-Salmeterol (ADVAIR DISKUS) 250-50 MCG/DOSE AEPB INHALE 1 PUFF INTO THE LUNGS EVERY EVENING. 60 each 5  .  HYDROcodone-acetaminophen (NORCO/VICODIN) 5-325 MG per tablet Take 1-2 tablets by mouth every 4 (four) hours as needed for moderate pain or severe pain. 50 tablet 0  . insulin aspart (NOVOLOG) 100 UNIT/ML injection PER SLIDING SCALE: 190 - 200 = 2 units 300 and above = 7 units 10 mL 5  . insulin glargine (LANTUS) 100 UNIT/ML injection INJECT 40 UNITS SUBCUTANEOUSLY EVERY MORNING 20 mL 0  . lisinopril (PRINIVIL,ZESTRIL) 5 MG tablet TAKE 1 TABLET (5 MG TOTAL) BY MOUTH DAILY. 30 tablet 0  . loratadine (CLARITIN) 10 MG tablet Take 10 mg by mouth at bedtime.      . predniSONE (STERAPRED UNI-PAK 21 TAB) 10 MG (21) TBPK tablet Use as directed 21 tablet 0   No current facility-administered medications for this visit.     ROS:  See HPI for pertinent positives and negatives.   Physical Examination  Vitals:   02/27/16 1024  BP: (!) 157/77  Pulse: 73  Resp: 20  Temp: 98.3 F (36.8 C)  TempSrc: Oral  SpO2: 98%  Weight: 167 lb (75.8 kg)  Height: 5\' 9"  (1.753 m)   Body mass index is 24.66 kg/m.  General: A&O x 3, WDWN. Gait: normal Eyes: PERRLA. Pulmonary: Respirations are non labored, limited air movement in all fields, +wheezes, no rales or rhonchi. Cardiac: regular rhythm, no detected murmur.         Carotid Bruits Right Left   Negative Negative  Aorta is not palpable. Radial pulses: 2+ palpable and =                           VASCULAR EXAM: Extremities no ischemic changes, no Gangrene; no open wounds.                                                                                                                                                       LE Pulses Right Left       FEMORAL   palpable  palpable       POPLITEAL  not palpable  not palpable       POSTERIOR TIBIAL  not palpable  not palpable       DORSALIS PEDIS      ANTERIOR TIBIAL not palpable not palpable   Abdomen: soft, NT, no palpable masses. Skin: no rashes, no ulcers. Musculoskeletal: no muscle wasting or  atrophy.         Neurologic: A&O X 3; Appropriate Affect ; SENSATION: normal; MOTOR FUNCTION:  moving all extremities equally, motor strength 5/5 throughout. Speech is fluent/normal.  CN 2-12 intact except is hard of hearing.     ASSESSMENT: Thomas Sandoval. is a 60 y.o. male who has moderate bilateral lower extremity intermittent claudication that is not life limiting. He has no signs of ischemia in his feet/legs.  He has known bilateral SFA occlusions found on lower extremities duplex in February 2016. Pt states he tried Pletal for 1-2 months and states no benefit.  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 He apparently has maintained good control of his DM that he has had since age 47, as self reported and as evidenced by no peripheral neuropathy symptoms and normal renal function as of February 2017. His primary atherosclerotic risk factor is his 2 ppd smoking.  Arterial perfusion has improved in both legs according to today's ABI's and TBI's.  DATA ABI: Right: 0.60 (0.51, 07-24-15), monophasic waveforms, TBI: 0.60 (0.40) Left: 0.72 (0.44), monophasic waveforms, TBI: 0.43 (0.28)       PLAN:  The patient was counseled re smoking cessation and given several free resources re smoking  cessation. Continue extensive walking.   Based on the patient's vascular studies and examination, pt will return to clinic in 1 year with ABI's. I advised him to notify us if he develops slow healing wounds or worse claudication.  I discussed in depth with the patient the nature of atherosclerosis, and emphasized the importance of maximal medical management including strict control of blood pressure, blood glucose, and lipid levels, obtaining regular exercise, and cessation of smoking.  The patient is aware that without maximal medical management the underlying atherosclerotic disease process will progress, limiting the benefit of any interventions.  The patient was given information about PAD  including signs, symptoms, treatment, what symptoms should prompt the patient to seek immediate medical care, and risk reduction measures to take.  Thomas Chambers, RN, MSN, FNP-C [facility] [employer] Phone: [phone]  Clinic MD: [facility]  02/27/16 10:26 AM

## 2016-02-27 NOTE — Addendum Note (Signed)
Addended by: Lianne Cure A on: 02/27/2016 11:23 AM   Modules accepted: Orders

## 2016-03-14 ENCOUNTER — Other Ambulatory Visit: Payer: Self-pay | Admitting: Family Medicine

## 2016-03-14 DIAGNOSIS — I10 Essential (primary) hypertension: Secondary | ICD-10-CM

## 2016-03-16 ENCOUNTER — Telehealth: Payer: Self-pay | Admitting: Nurse Practitioner

## 2016-03-16 ENCOUNTER — Other Ambulatory Visit: Payer: Self-pay

## 2016-03-16 DIAGNOSIS — E1122 Type 2 diabetes mellitus with diabetic chronic kidney disease: Secondary | ICD-10-CM

## 2016-03-16 DIAGNOSIS — Z794 Long term (current) use of insulin: Principal | ICD-10-CM

## 2016-03-16 DIAGNOSIS — N182 Chronic kidney disease, stage 2 (mild): Principal | ICD-10-CM

## 2016-03-16 MED ORDER — INSULIN GLARGINE 100 UNIT/ML ~~LOC~~ SOLN: SUBCUTANEOUS | 2 refills | Status: DC

## 2016-03-16 MED ORDER — INSULIN ASPART 100 UNIT/ML ~~LOC~~ SOLN: SUBCUTANEOUS | 2 refills | Status: DC

## 2016-03-16 NOTE — Telephone Encounter (Signed)
done

## 2016-04-03 ENCOUNTER — Other Ambulatory Visit: Payer: Self-pay | Admitting: Nurse Practitioner

## 2016-04-03 DIAGNOSIS — Z794 Long term (current) use of insulin: Principal | ICD-10-CM

## 2016-04-03 DIAGNOSIS — E1122 Type 2 diabetes mellitus with diabetic chronic kidney disease: Secondary | ICD-10-CM

## 2016-04-03 DIAGNOSIS — N182 Chronic kidney disease, stage 2 (mild): Principal | ICD-10-CM

## 2016-04-08 ENCOUNTER — Other Ambulatory Visit: Payer: Self-pay | Admitting: Nurse Practitioner

## 2016-04-08 DIAGNOSIS — J410 Simple chronic bronchitis: Secondary | ICD-10-CM

## 2016-06-11 ENCOUNTER — Other Ambulatory Visit: Payer: Self-pay

## 2016-06-11 DIAGNOSIS — I10 Essential (primary) hypertension: Secondary | ICD-10-CM

## 2016-06-11 MED ORDER — LISINOPRIL 5 MG PO TABS
5.0000 mg | ORAL_TABLET | Freq: Every day | ORAL | 0 refills | Status: DC
Start: 2016-06-11 — End: 2016-08-05

## 2016-08-05 ENCOUNTER — Other Ambulatory Visit: Payer: Self-pay | Admitting: *Deleted

## 2016-08-05 DIAGNOSIS — I10 Essential (primary) hypertension: Secondary | ICD-10-CM

## 2016-08-05 MED ORDER — LISINOPRIL 5 MG PO TABS: 5.0000 mg | ORAL_TABLET | Freq: Every day | ORAL | 0 refills | Status: DC

## 2016-08-10 ENCOUNTER — Other Ambulatory Visit: Payer: Self-pay | Admitting: Family

## 2016-08-10 DIAGNOSIS — N182 Chronic kidney disease, stage 2 (mild): Principal | ICD-10-CM

## 2016-08-10 DIAGNOSIS — E1122 Type 2 diabetes mellitus with diabetic chronic kidney disease: Secondary | ICD-10-CM

## 2016-08-10 DIAGNOSIS — Z794 Long term (current) use of insulin: Principal | ICD-10-CM

## 2016-08-27 ENCOUNTER — Encounter: Payer: Self-pay | Admitting: Pediatrics

## 2016-08-27 ENCOUNTER — Ambulatory Visit (INDEPENDENT_AMBULATORY_CARE_PROVIDER_SITE_OTHER): Payer: BLUE CROSS/BLUE SHIELD | Admitting: Pediatrics

## 2016-08-27 ENCOUNTER — Ambulatory Visit (INDEPENDENT_AMBULATORY_CARE_PROVIDER_SITE_OTHER): Payer: BLUE CROSS/BLUE SHIELD

## 2016-08-27 VITALS — BP 117/56 | HR 71 | Temp 97.1°F | Resp 16 | Ht 69.0 in | Wt 168.2 lb

## 2016-08-27 DIAGNOSIS — R0789 Other chest pain: Secondary | ICD-10-CM

## 2016-08-27 DIAGNOSIS — R0781 Pleurodynia: Secondary | ICD-10-CM | POA: Diagnosis not present

## 2016-08-27 DIAGNOSIS — R0600 Dyspnea, unspecified: Secondary | ICD-10-CM | POA: Diagnosis not present

## 2016-08-27 MED ORDER — CYCLOBENZAPRINE HCL 10 MG PO TABS: 10.0000 mg | ORAL_TABLET | Freq: Three times a day (TID) | ORAL | 0 refills | Status: DC | PRN
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
## 2016-08-27 NOTE — Progress Notes (Signed)
Patient aware.

## 2016-09-08 DIAGNOSIS — Z8551 Personal history of malignant neoplasm of bladder: Secondary | ICD-10-CM | POA: Diagnosis not present

## 2016-10-16 ENCOUNTER — Other Ambulatory Visit: Payer: Self-pay | Admitting: Nurse Practitioner

## 2016-10-16 DIAGNOSIS — Z794 Long term (current) use of insulin: Principal | ICD-10-CM

## 2016-10-16 DIAGNOSIS — E1122 Type 2 diabetes mellitus with diabetic chronic kidney disease: Secondary | ICD-10-CM

## 2016-10-16 DIAGNOSIS — N182 Chronic kidney disease, stage 2 (mild): Principal | ICD-10-CM

## 2016-11-13 ENCOUNTER — Other Ambulatory Visit: Payer: Self-pay | Admitting: Nurse Practitioner

## 2016-11-13 DIAGNOSIS — E1122 Type 2 diabetes mellitus with diabetic chronic kidney disease: Secondary | ICD-10-CM

## 2016-11-13 DIAGNOSIS — N182 Chronic kidney disease, stage 2 (mild): Principal | ICD-10-CM

## 2016-11-13 DIAGNOSIS — Z794 Long term (current) use of insulin: Principal | ICD-10-CM

## 2016-11-27 ENCOUNTER — Other Ambulatory Visit: Payer: Self-pay

## 2016-11-27 DIAGNOSIS — J410 Simple chronic bronchitis: Secondary | ICD-10-CM

## 2016-11-27 MED ORDER — FLUTICASONE-SALMETEROL 250-50 MCG/DOSE IN AEPB: INHALATION_SPRAY | RESPIRATORY_TRACT | 0 refills | Status: DC

## 2016-12-11 ENCOUNTER — Other Ambulatory Visit: Payer: Self-pay

## 2016-12-11 DIAGNOSIS — Z794 Long term (current) use of insulin: Principal | ICD-10-CM

## 2016-12-11 DIAGNOSIS — E1122 Type 2 diabetes mellitus with diabetic chronic kidney disease: Secondary | ICD-10-CM

## 2016-12-11 DIAGNOSIS — N182 Chronic kidney disease, stage 2 (mild): Principal | ICD-10-CM

## 2016-12-11 MED ORDER — INSULIN GLARGINE 100 UNIT/ML ~~LOC~~ SOLN: SUBCUTANEOUS | 1 refills | Status: DC

## 2016-12-13 ENCOUNTER — Other Ambulatory Visit: Payer: Self-pay | Admitting: Pediatrics

## 2016-12-13 DIAGNOSIS — N182 Chronic kidney disease, stage 2 (mild): Principal | ICD-10-CM

## 2016-12-13 DIAGNOSIS — E1122 Type 2 diabetes mellitus with diabetic chronic kidney disease: Secondary | ICD-10-CM

## 2016-12-13 DIAGNOSIS — Z794 Long term (current) use of insulin: Principal | ICD-10-CM

## 2016-12-25 ENCOUNTER — Other Ambulatory Visit: Payer: Self-pay | Admitting: Nurse Practitioner

## 2016-12-25 DIAGNOSIS — I10 Essential (primary) hypertension: Secondary | ICD-10-CM

## 2017-01-23 ENCOUNTER — Other Ambulatory Visit: Payer: Self-pay

## 2017-01-23 ENCOUNTER — Ambulatory Visit: Payer: BLUE CROSS/BLUE SHIELD | Admitting: Nurse Practitioner

## 2017-01-23 VITALS — BP 132/75 | HR 86 | Temp 97.5°F | Ht 69.0 in | Wt 169.0 lb

## 2017-01-23 DIAGNOSIS — J440 Chronic obstructive pulmonary disease with acute lower respiratory infection: Secondary | ICD-10-CM

## 2017-01-23 DIAGNOSIS — J209 Acute bronchitis, unspecified: Secondary | ICD-10-CM

## 2017-01-23 DIAGNOSIS — J410 Simple chronic bronchitis: Secondary | ICD-10-CM | POA: Diagnosis not present

## 2017-01-23 MED ORDER — AZITHROMYCIN 250 MG PO TABS: ORAL_TABLET | ORAL | 0 refills | Status: DC

## 2017-01-23 MED ORDER — ALBUTEROL SULFATE HFA 108 (90 BASE) MCG/ACT IN AERS: 2.0000 | INHALATION_SPRAY | Freq: Four times a day (QID) | RESPIRATORY_TRACT | 2 refills | Status: DC | PRN

## 2017-01-23 MED ORDER — HYDROCODONE-HOMATROPINE 5-1.5 MG/5ML PO SYRP
5.0000 mL | ORAL_SOLUTION | Freq: Four times a day (QID) | ORAL | 0 refills | Status: DC | PRN
Start: 2017-01-23 — End: 2017-02-27

## 2017-01-23 MED ORDER — MOMETASONE FURO-FORMOTEROL FUM 100-5 MCG/ACT IN AERO: 2.0000 | INHALATION_SPRAY | Freq: Two times a day (BID) | RESPIRATORY_TRACT | 2 refills | Status: DC

## 2017-01-23 MED ORDER — DOXYCYCLINE HYCLATE 100 MG PO TABS: 100.0000 mg | ORAL_TABLET | Freq: Two times a day (BID) | ORAL | 0 refills | Status: DC

## 2017-01-23 NOTE — Patient Instructions (Signed)

## 2017-01-23 NOTE — Addendum Note (Signed)
Addended by: Chevis Pretty on: 01/23/2017 10:16 AM   Modules accepted: Orders

## 2017-01-23 NOTE — Progress Notes (Addendum)
Subjective:    Patient ID: Thomas Medici., male    DOB: [birth year], 61 y.o.   MRN: [id number]  HPI Patient in today c/o cough and congestion. No fever at home. Has been sick for several weeks but coughing got worse last night.    Review of Systems  Constitutional: Negative for chills and fever.  HENT: Positive for congestion. Negative for sore throat and trouble swallowing.   Respiratory: Positive for cough (productive). Negative for shortness of breath.   Cardiovascular: Negative.   Gastrointestinal: Negative.   Genitourinary: Negative.   Neurological: Negative.   Psychiatric/Behavioral: Negative.   All other systems reviewed and are negative.      Objective:   Physical Exam  Constitutional: He is oriented to person, place, and time. He appears well-developed and well-nourished. No distress.  HENT:  Right Ear: Hearing, tympanic membrane, external ear and ear canal normal.  Left Ear: Hearing, tympanic membrane, external ear and ear canal normal.  Nose: Mucosal edema and rhinorrhea present. Right sinus exhibits no maxillary sinus tenderness and no frontal sinus tenderness. Left sinus exhibits no maxillary sinus tenderness and no frontal sinus tenderness.  Mouth/Throat: Uvula is midline, oropharynx is clear and moist and mucous membranes are normal.  Cardiovascular: Normal rate and regular rhythm.  Pulmonary/Chest: Effort normal. He has wheezes (exp wheezes throughout).  Rhonchi throughout  Neurological: He is alert and oriented to person, place, and time.  Skin: Skin is warm.  Psychiatric: He has a normal mood and affect. His behavior is normal. Judgment and thought content normal.   BP 132/75   Pulse 86   Temp (!) 97.5 F (36.4 C) (Oral)   Ht 5\' 9"  (1.753 m)   Wt 169 lb (76.7 kg)   BMI 24.96 kg/m        Assessment & Plan:  1. Acute bronchitis with COPD ([place]) 1. Take meds as prescribed 2. Use a cool mist humidifier especially during the winter months and  when heat has been humid. 3. Use saline nose sprays frequently 4. Saline irrigations of the nose can be very helpful if done frequently.  * 4X daily for 1 week*  * Use of a nettie pot can be helpful with this. Follow directions with this* 5. Drink plenty of fluids 6. Keep thermostat turn down low 7.For any cough or congestion  Use plain Mucinex- regular strength or max strength is fine   * Children- consult with Pharmacist for dosing 8. For fever or aces or pains- take tylenol or ibuprofen appropriate for age and weight.  * for fevers greater than 101 orally you may alternate ibuprofen and tylenol every  3 hours.   Meds ordered this encounter  Medications  . azithromycin (ZITHROMAX Z-PAK) 250 MG tablet    Sig: As directed    Dispense:  6 tablet    Refill:  0    Order Specific Question:   Supervising Provider    Answer:   VINCENT, CAROL L [[id number]]  . HYDROcodone-homatropine (HYCODAN) 5-1.5 MG/5ML syrup    Sig: Take 5 mLs every 6 (six) hours as needed by mouth for cough.    Dispense:  120 mL    Refill:  0    Order Specific Question:   Supervising Provider    Answer:   VINCENT, CAROL L [[id number]]  . albuterol (PROVENTIL HFA;VENTOLIN HFA) 108 (90 Base) MCG/ACT inhaler    Sig: Inhale 2 puffs every 6 (six) hours as needed into the lungs for wheezing or shortness of  breath.    Dispense:  1 Inhaler    Refill:  2    Order Specific Question:   Supervising Provider    Answer:   VINCENT, CAROL L [[id number]]  . doxycycline (VIBRA-TABS) 100 MG tablet    Sig: Take 1 tablet (100 mg total) 2 (two) times daily by mouth. 1 po bid    Dispense:  20 tablet    Refill:  0    Do not fill zpak as previously rx- allergic    Order Specific Question:   Supervising Provider    Answer:   VINCENT, CAROL L [[id number]]     2. Simple chronic bronchitis (HCC) - albuterol (PROVENTIL HFA;VENTOLIN HFA) 108 (90 Base) MCG/ACT inhaler; Inhale 2 puffs every 6 (six) hours as needed into the lungs for wheezing or shortness of  breath.  Dispense: 1 Inhaler; Refill: 2   [name] Hassell Done, FNP

## 2017-02-15 ENCOUNTER — Other Ambulatory Visit: Payer: Self-pay | Admitting: Family

## 2017-02-15 DIAGNOSIS — J410 Simple chronic bronchitis: Secondary | ICD-10-CM

## 2017-02-27 ENCOUNTER — Ambulatory Visit: Payer: BLUE CROSS/BLUE SHIELD | Admitting: Family Medicine

## 2017-02-27 ENCOUNTER — Encounter: Payer: Self-pay | Admitting: Family Medicine

## 2017-02-27 VITALS — BP 104/60 | HR 79 | Temp 97.6°F | Ht 69.0 in | Wt 171.2 lb

## 2017-02-27 DIAGNOSIS — J441 Chronic obstructive pulmonary disease with (acute) exacerbation: Secondary | ICD-10-CM

## 2017-02-27 MED ORDER — FLUTICASONE-UMECLIDIN-VILANT 100-62.5-25 MCG/INH IN AEPB: 1.0000 | INHALATION_SPRAY | Freq: Every day | RESPIRATORY_TRACT | 1 refills | Status: DC

## 2017-02-27 MED ORDER — DOXYCYCLINE HYCLATE 100 MG PO TABS: 100.0000 mg | ORAL_TABLET | Freq: Two times a day (BID) | ORAL | 0 refills | Status: DC

## 2017-02-27 MED ORDER — PREDNISONE 20 MG PO TABS: ORAL_TABLET | ORAL | 0 refills | Status: DC

## 2017-02-27 NOTE — Progress Notes (Signed)
BP 104/60   Pulse 79   Temp 97.6 F (36.4 C) (Oral)   Ht 5\' 9"  (1.753 m)   Wt 171 lb 3.2 oz (77.7 kg)   BMI 25.28 kg/m    Subjective:    Patient ID: Thomas Medici., male    DOB: Dec 13, 1955, 61 y.o.   MRN: [id number]  HPI: Thomas Ruhland. is a 61 y.o. male presenting on 02/27/2017 for Cough (productive, white phlegm, no known fever)   HPI Cough and congestion and wheezing and shortness of breath She was seen about 4 weeks ago for similar symptoms and he improved but did not get fully better.  At that time he was given doxycycline.  He has been using his Advair and pro-air and they have been helping but not resolving the symptoms.  He says is been having cough and wheezing and shortness of breath.  Patient continues to smoke and says that he has no desire to quit at this point.  He says especially when he is up and moving around he can get really short of breath and wheezy and is having a lot of coughing at night as well.  Relevant past medical, surgical, family and social history reviewed and updated as indicated. Interim medical history since our last visit reviewed. Allergies and medications reviewed and updated.  Review of Systems  Constitutional: Negative for chills and fever.  HENT: Positive for congestion, postnasal drip, rhinorrhea, sinus pressure and sore throat. Negative for ear discharge, ear pain, sneezing and voice change.   Eyes: Negative for pain, discharge, redness and visual disturbance.  Respiratory: Positive for cough, shortness of breath and wheezing.   Cardiovascular: Negative for chest pain and leg swelling.  Musculoskeletal: Negative for gait problem.  Skin: Negative for rash.  All other systems reviewed and are negative.   Per HPI unless specifically indicated above        Objective:    BP 104/60   Pulse 79   Temp 97.6 F (36.4 C) (Oral)   Ht 5\' 9"  (1.753 m)   Wt 171 lb 3.2 oz (77.7 kg)   BMI 25.28 kg/m   Wt Readings from Last 3  Encounters:  02/27/17 171 lb 3.2 oz (77.7 kg)  01/23/17 169 lb (76.7 kg)  08/27/16 168 lb 3.2 oz (76.3 kg)    Physical Exam  Constitutional: He is oriented to person, place, and time. He appears well-developed and well-nourished. No distress.  HENT:  Right Ear: Tympanic membrane, external ear and ear canal normal.  Left Ear: Tympanic membrane, external ear and ear canal normal.  Nose: Mucosal edema and rhinorrhea present. No sinus tenderness. No epistaxis. Right sinus exhibits maxillary sinus tenderness. Right sinus exhibits no frontal sinus tenderness. Left sinus exhibits maxillary sinus tenderness. Left sinus exhibits no frontal sinus tenderness.  Mouth/Throat: Uvula is midline and mucous membranes are normal. Posterior oropharyngeal edema and posterior oropharyngeal erythema present. No oropharyngeal exudate or tonsillar abscesses.  Eyes: Conjunctivae and EOM are normal. Pupils are equal, round, and reactive to light. No scleral icterus.  Neck: Neck supple. No thyromegaly present.  Cardiovascular: Normal rate, regular rhythm, normal heart sounds and intact distal pulses.  No murmur heard. Pulmonary/Chest: Effort normal. No respiratory distress. He has wheezes. He has rhonchi. He has no rales.  Musculoskeletal: Normal range of motion. He exhibits no edema.  Lymphadenopathy:    He has no cervical adenopathy.  Neurological: He is alert and oriented to person, place, and time. Coordination normal.  Skin: Skin is warm and dry. No rash noted. He is not diaphoretic.  Psychiatric: He has a normal mood and affect. His behavior is normal.  Nursing note and vitals reviewed.       Assessment & Plan:   Problem List Items Addressed This Visit    None    Visit Diagnoses    COPD exacerbation ([place])    -  Primary   Relevant Medications   predniSONE (DELTASONE) 20 MG tablet   doxycycline (VIBRA-TABS) 100 MG tablet   Fluticasone-Umeclidin-Vilant (TRELEGY ELLIPTA) 100-62.5-25 MCG/INH AEPB         Follow up plan: Return in about 4 weeks (around 03/27/2017), or if symptoms worsen or fail to improve, for Follow-up with PCP on recheck for COPD.  Counseling provided for all of the vaccine components No orders of the defined types were placed in this encounter.   Caryl Pina, MD [facility] 02/27/2017, 10:44 AM

## 2017-03-05 ENCOUNTER — Encounter (HOSPITAL_COMMUNITY): Payer: BLUE CROSS/BLUE SHIELD

## 2017-03-05 ENCOUNTER — Ambulatory Visit: Payer: BLUE CROSS/BLUE SHIELD | Admitting: Family

## 2017-03-12 ENCOUNTER — Ambulatory Visit: Payer: BLUE CROSS/BLUE SHIELD | Admitting: Family Medicine

## 2017-03-12 DIAGNOSIS — S62630A Displaced fracture of distal phalanx of right index finger, initial encounter for closed fracture: Secondary | ICD-10-CM | POA: Diagnosis not present

## 2017-03-12 DIAGNOSIS — Z6824 Body mass index (BMI) 24.0-24.9, adult: Secondary | ICD-10-CM | POA: Diagnosis not present

## 2017-03-12 DIAGNOSIS — S62642A Nondisplaced fracture of proximal phalanx of right middle finger, initial encounter for closed fracture: Secondary | ICD-10-CM | POA: Diagnosis not present

## 2017-03-12 DIAGNOSIS — S6991XA Unspecified injury of right wrist, hand and finger(s), initial encounter: Secondary | ICD-10-CM | POA: Diagnosis not present

## 2017-03-17 ENCOUNTER — Encounter: Payer: Self-pay | Admitting: Nurse Practitioner

## 2017-03-31 ENCOUNTER — Other Ambulatory Visit: Payer: Self-pay | Admitting: Nurse Practitioner

## 2017-03-31 DIAGNOSIS — I10 Essential (primary) hypertension: Secondary | ICD-10-CM

## 2017-04-02 ENCOUNTER — Ambulatory Visit (INDEPENDENT_AMBULATORY_CARE_PROVIDER_SITE_OTHER): Payer: BLUE CROSS/BLUE SHIELD | Admitting: Family

## 2017-04-02 ENCOUNTER — Encounter: Payer: Self-pay | Admitting: Family

## 2017-04-02 ENCOUNTER — Ambulatory Visit (HOSPITAL_COMMUNITY)
Admission: RE | Admit: 2017-04-02 | Discharge: 2017-04-02 | Disposition: A | Discharge status: Home / Self Care | Payer: BLUE CROSS/BLUE SHIELD | Source: Ambulatory Visit | Attending: Family | Admitting: Family

## 2017-04-02 VITALS — BP 117/67 | HR 65 | Temp 97.8°F | Resp 20 | Ht 69.0 in | Wt 172.0 lb

## 2017-04-02 DIAGNOSIS — F17200 Nicotine dependence, unspecified, uncomplicated: Secondary | ICD-10-CM | POA: Diagnosis not present

## 2017-04-02 DIAGNOSIS — I70213 Atherosclerosis of native arteries of extremities with intermittent claudication, bilateral legs: Secondary | ICD-10-CM

## 2017-04-02 NOTE — Progress Notes (Signed)
CC: Follow up Intermittent Claudication  History of Present Illness   Thomas Sandoval. is a 62 y.o. (1955/12/22) male whom Dr. Trula Slade has been monitoring and is back today for follow-up of his bilateral lower extremity claudication that he first noted in 2015.  He has known bilateral SFA occlusions found on lower extremities duplex in February 2016.  Dr. Trula Slade last saw pt in April of 2016. At that time Dr. Trula Slade thought his walking distance was more limited by his back than his legs. For that reason, coupled with the fact that he does not have rest pain or nonhealing wounds in his lower extremities, Dr. Trula Slade felt that the best course of action is continued medical management, rather than intervention. Dr. Trula Slade stressed the importance of smoking cessation; that if he were to develop a nonhealing wound or infection on his leg that he would likely require revascularization to prevent limb loss. His symptoms were not significant enough at that time to warrant invasive treatment.Dr. Trula Slade requested the patient follow-up in one year with a repeat ankle-brachial index, and pt is back today for follow up.  His has significant lumbar spine issues, states his low back pain limits his walking before his legs do.   The patient was not started on a statin because of his low LDL.  Pt denies any known history of stroke or TIA. He denies any known cardiac problems other than 2 known episodes of afib; pt's wife states his cardiologist advised that he take Eliquis in the Fall of 2016 but pt declined.    Diabetic: Yes, was diagnosed at age 33, states his last A1C was less than 7, creatinine was normal at 0.89in February 2017(review of records). His wife indicates that he has maintained good control of his DM, has no peripheral neuropathy Tobacco use:  smoker (2 ppd, started smoking at age 34 yrs)  Pt meds include: Statin :No, states his cholesterol is very good Betablocker:  No ASA: Yes, 81 mg Other anticoagulants/antiplatelets: no   Past Medical History:  Diagnosis Date  . Arthritis   . Bladder cancer ([place])   . Chronic back pain   . COPD (chronic obstructive pulmonary disease) ([place])   . DDD (degenerative disc disease)   . Diabetic retinopathy of both eyes ([place])   . Essential hypertension   . GERD (gastroesophageal reflux disease)   . History of atrial flutter 02/2011   Converted to NSR with Cardizem  . History of chronic bronchitis   . History of hemolytic anemia 02/2011   secondary to Avelox  . HOH (hard of hearing)   . HOH (hard of hearing)    no eardrum and nerve damage on R, also HOH on L  . Mitral valve prolapse    a. 2D Echo 11/27/14: EF 55-60%; images were inadequate for LV wall motion assessment, + mild late systolic mitral valve prolapse involving the anterior leaflet.  Marland Kitchen PAD (peripheral artery disease) ([place]) 04/2014   Dr Trula Slade; bilateral SFA occlusion, R mid, L distal  . PAF (paroxysmal atrial fibrillation) ([place])    a. Dx 11/2014 during admission for perf ulcer.  . Perforated ulcer ([place])    a. 11/2014 s/p surgery.  . Productive cough   . Smokers' cough ([place])   . Type 1 diabetes mellitus ([place]) 1977    Social History Social History   Tobacco Use  . Smoking status: Current Every Day Smoker    Packs/day: 2.00    Years: 30.00  Pack years: 60.00    Types: Cigarettes  . Smokeless tobacco: Former Systems developer    Quit date: 06/08/1978  Substance Use Topics  . Alcohol use: No    Alcohol/week: 0.0 oz  . Drug use: No    Family History Family History  Problem Relation Age of Onset  . Breast cancer Mother   . Cancer Mother        Breast  . Rheumatic fever Father   . Heart disease Father   . Heart attack Father        Massive   . Diabetes Son     Surgical History Past Surgical History:  Procedure Laterality Date  . CYSTOSCOPY WITH URETEROSCOPY Right 08/14/2013   Procedure: CYSTOSCOPY WITH URETEROSCOPY BLADDER BIOPSY ;  Surgeon: Claybon Jabs, MD;  Location[facility];  Service: Urology;  Laterality: Right;  . ESOPHAGOGASTRODUODENOSCOPY N/A 02/06/2013   Procedure: ESOPHAGOGASTRODUODENOSCOPY (EGD);  Surgeon: Lafayette Dragon, MD;  Location: [facility];  Service: Endoscopy;  Laterality: N/A;  . LAPAROSCOPY N/A 11/25/2014   Procedure: LAPAROSCOPIC PRIMARY REPAIR OF PERFORATED PREPYLORIC ULCER WITH Silvestre Gunner;  Surgeon: Greer Pickerel, MD;  Location: [facility];  Service: General;  Laterality: N/A;  . TONSILLECTOMY  as child  . TRANSTHORACIC ECHOCARDIOGRAM  02-17-2011   MODERATE LVH/  EF 65%  . TRANSURETHRAL RESECTION OF BLADDER TUMOR WITH GYRUS (TURBT-GYRUS) N/A 06/12/2013   Procedure: TRANSURETHRAL RESECTION OF BLADDER TUMOR WITH GYRUS (TURBT-GYRUS);  Surgeon: Claybon Jabs, MD;  Location: [facility];  Service: Urology;  Laterality: N/A;  . TYMPANIC MEMBRANE REPAIR  as child    Allergies  Allergen Reactions  . Azithromycin Other (See Comments) and Nausea And Vomiting    "Severe stomach cramps; told to list as an allergy by dr. Huel Cote ago"  . Avelox [Moxifloxacin Hcl In Nacl] Other (See Comments)    Hemolysis  In 2012  . Bactrim [Sulfamethoxazole-Trimethoprim] Diarrhea and Nausea And Vomiting    Current Outpatient Medications  Medication Sig Dispense Refill  . albuterol (PROVENTIL HFA;VENTOLIN HFA) 108 (90 Base) MCG/ACT inhaler Inhale 2 puffs every 6 (six) hours as needed into the lungs for wheezing or shortness of breath. 1 Inhaler 2  . aspirin 81 MG tablet Take 81 mg by mouth daily.    Marland Kitchen doxycycline (VIBRA-TABS) 100 MG tablet Take 1 tablet (100 mg total) by mouth 2 (two) times daily. 1 po bid 28 tablet 0  . famotidine (PEPCID) 20 MG tablet Take 1 tablet (20 mg total) by mouth every morning. Reported on 04/16/2015 30 tablet 5  . Fluticasone-Salmeterol (ADVAIR DISKUS) 250-50 MCG/DOSE AEPB INHALE 1 PUFF INTO THE LUNGS EVERY EVENING. 180 each 0  . Fluticasone-Umeclidin-Vilant (TRELEGY ELLIPTA)  100-62.5-25 MCG/INH AEPB Inhale 1 puff into the lungs daily. 30 each 1  . insulin aspart (NOVOLOG) 100 UNIT/ML injection PER SLIDING SCALE: 190 - 200 = 2 units 300 and above = 7 units 10 mL 2  . insulin glargine (LANTUS) 100 UNIT/ML injection INJECT 40 UNITS SUBCUTANEOUSLY EVERY MORNING 40 mL 1  . lisinopril (PRINIVIL,ZESTRIL) 5 MG tablet TAKE 1 TABLET BY MOUTH EVERY DAY 90 tablet 0  . loratadine (CLARITIN) 10 MG tablet Take 10 mg by mouth at bedtime.      . mometasone-formoterol (DULERA) 100-5 MCG/ACT AERO Inhale 2 puffs 2 (two) times daily into the lungs. 1 Inhaler 2   No current facility-administered medications for this visit.     REVIEW OF SYSTEMS: see HPI for pertinent positives and negatives  Physical Examination  Vitals:   04/02/17 1150  BP: 117/67  Pulse: 65  Resp: 20  Temp: 97.8 F (36.6 C)  TempSrc: Oral  SpO2: 96%  Weight: 172 lb (78 kg)  Height: 5\' 9"  (1.753 m)   Body mass index is 25.4 kg/m.  General: A&O x 3, fit appearing male. Gait: normal HENT: No apparent abnormalities  Eyes: PERRLA. Pulmonary: Respirations are non labored, limited air movement in all fields, +wheezes, no rales or rhonchi. Cardiac: regular rhythm, no detected murmur.    Carotid Bruits Right Left   Negative Negative   Abdominal aortic pulse is not palpable. Radial pulses: 2+ palpable and =   VASCULAR EXAM: Extremitiesno ischemic changes, no Gangrene; no open wounds.  LE Pulses Right Left  FEMORAL palpable palpable  POPLITEAL not palpable not palpable  POSTERIOR TIBIAL not palpable not palpable  DORSALIS PEDIS ANTERIOR TIBIAL not palpable not palpable   Abdomen: soft, NT, no palpable masses. Skin: no rashes, no ulcers, no cellulitis.  Musculoskeletal: no muscle wasting or atrophy. Neurologic: A&O X 3; appropriate affect; SENSATION: normal; MOTOR FUNCTION: moving all extremities equally, motor  strength 5/5 throughout. Speech is fluent/normal. CN 2-12 intact except is hard of hearing.    Psychiatric: Normal thought content. Mood appropriate to clinical situation.    DATA  ABI (Date: 04/02/2017):  R: 0.52 (was 0.60 on 02-27-16), DP: mono, PT: mono, TBI: 0.36 (was 0.60)  L: 0.48 (was 0.72), DP: mono, PT: mono, TBI: 0.34 (was 0.43)  Decline in bilateral ABI, moderate disease in the right, severe in the left, monophasic waveforms remain.     Medical Decision Making  Thomas Sandoval. is a 62 y.o. male who has moderate bilateral lower extremity intermittent claudication that is not life limiting. He has no signs of ischemia in his feet/legs.  He has known bilateral SFA occlusions found on lower extremities duplex in February 2016. Pt states he tried Pletal for 1-2 months and states no benefit. He has been walking less, most likely reason for decline in ABI other than tobacco use.  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 He apparently has maintained good control of his DM that he has had since age 66, as self reported and as evidenced by no peripheral neuropathy symptoms and normal renal function as of February 2017. His primary atherosclerotic risk factor is his 2 ppd smoking  The patient was counseled re smoking cessation and given several free resources re smoking cessation. Continue extensive walking.  Based on the patient's vascular studies and examination, pt will return to clinic in 1 year with ABI's. I advised him to notify us if he develops slow healing wounds or worse claudication   I discussed in depth with the patient the nature of atherosclerosis, and emphasized the importance of maximal medical management including strict control of blood pressure, blood glucose, and lipid levels, antiplatelet agents, obtaining regular exercise, and cessation of smoking.         I discussed with the patient the natural history of intermittent claudication: 75% of patients have  stable or improved symptoms in a year an only 2% require amputation. Eventually 20% may require intervention in a year.  The patient is aware that without maximal medical management the underlying atherosclerotic disease process will progress, limiting the benefit of any interventions. The patient is currently not on a statin, cholesterol is at goal per pt.   The patient is currently on an anti-platelet.  Thank you for allowing [place] to  participate in this patient's care.  Clemon Chambers, RN, MSN, FNP-C [facility] Office: [phone]  04/02/2017, 12:02 PM  Clinic MD: Bridgett Larsson

## 2017-04-02 NOTE — Patient Instructions (Signed)
Steps to Quit Smoking Smoking tobacco can be bad for your health. It can also affect almost every organ in your body. Smoking puts you and people around you at risk for many serious long-lasting (chronic) diseases. Quitting smoking is hard, but it is one of the best things that you can do for your health. It is never too late to quit. What are the benefits of quitting smoking? When you quit smoking, you lower your risk for getting serious diseases and conditions. They can include:  Lung cancer or lung disease.  Heart disease.  Stroke.  Heart attack.  Not being able to have children (infertility).  Weak bones (osteoporosis) and broken bones (fractures).  If you have coughing, wheezing, and shortness of breath, those symptoms may get better when you quit. You may also get sick less often. If you are pregnant, quitting smoking can help to lower your chances of having a baby of low birth weight. What can I do to help me quit smoking? Talk with your doctor about what can help you quit smoking. Some things you can do (strategies) include:  Quitting smoking totally, instead of slowly cutting back how much you smoke over a period of time.  Going to in-person counseling. You are more likely to quit if you go to many counseling sessions.  Using resources and support systems, such as: ? Online chats with a counselor. ? Phone quitlines. ? Printed self-help materials. ? Support groups or group counseling. ? Text messaging programs. ? Mobile phone apps or applications.  Taking medicines. Some of these medicines may have nicotine in them. If you are pregnant or breastfeeding, do not take any medicines to quit smoking unless your doctor says it is okay. Talk with your doctor about counseling or other things that can help you.  Talk with your doctor about using more than one strategy at the same time, such as taking medicines while you are also going to in-person counseling. This can help make  quitting easier. What things can I do to make it easier to quit? Quitting smoking might feel very hard at first, but there is a lot that you can do to make it easier. Take these steps:  Talk to your family and friends. Ask them to support and encourage you.  Call phone quitlines, reach out to support groups, or work with a counselor.  Ask people who smoke to not smoke around you.  Avoid places that make you want (trigger) to smoke, such as: ? Bars. ? Parties. ? Smoke-break areas at work.  Spend time with people who do not smoke.  Lower the stress in your life. Stress can make you want to smoke. Try these things to help your stress: ? Getting regular exercise. ? Deep-breathing exercises. ? Yoga. ? Meditating. ? Doing a body scan. To do this, close your eyes, focus on one area of your body at a time from head to toe, and notice which parts of your body are tense. Try to relax the muscles in those areas.  Download or buy apps on your mobile phone or tablet that can help you stick to your quit plan. There are many free apps, such as QuitGuide from the CDC (Centers for Disease Control and Prevention). You can find more support from smokefree.gov and other websites.  This information is not intended to replace advice given to you by your health care provider. Make sure you discuss any questions you have with your health care provider. Document Released: 12/20/2008 Document   Revised: 10/22/2015 Document Reviewed: 07/10/2014 Elsevier Interactive Patient Education  2018 Elsevier Inc.     Peripheral Vascular Disease Peripheral vascular disease (PVD) is a disease of the blood vessels that are not part of your heart and brain. A simple term for PVD is poor circulation. In most cases, PVD narrows the blood vessels that carry blood from your heart to the rest of your body. This can result in a decreased supply of blood to your arms, legs, and internal organs, like your stomach or kidneys.  However, it most often affects a person's lower legs and feet. There are two types of PVD.  Organic PVD. This is the more common type. It is caused by damage to the structure of blood vessels.  Functional PVD. This is caused by conditions that make blood vessels contract and tighten (spasm).  Without treatment, PVD tends to get worse over time. PVD can also lead to acute ischemic limb. This is when an arm or limb suddenly has trouble getting enough blood. This is a medical emergency. Follow these instructions at home:  Take medicines only as told by your doctor.  Do not use any tobacco products, including cigarettes, chewing tobacco, or electronic cigarettes. If you need help quitting, ask your doctor.  Lose weight if you are overweight, and maintain a healthy weight as told by your doctor.  Eat a diet that is low in fat and cholesterol. If you need help, ask your doctor.  Exercise regularly. Ask your doctor for some good activities for you.  Take good care of your feet. ? Wear comfortable shoes that fit well. ? Check your feet often for any cuts or sores. Contact a doctor if:  You have cramps in your legs while walking.  You have leg pain when you are at rest.  You have coldness in a leg or foot.  Your skin changes.  You are unable to get or have an erection (erectile dysfunction).  You have cuts or sores on your feet that are not healing. Get help right away if:  Your arm or leg turns cold and blue.  Your arms or legs become red, warm, swollen, painful, or numb.  You have chest pain or trouble breathing.  You suddenly have weakness in your face, arm, or leg.  You become very confused or you cannot speak.  You suddenly have a very bad headache.  You suddenly cannot see. This information is not intended to replace advice given to you by your health care provider. Make sure you discuss any questions you have with your health care provider. Document Released:  05/20/2009 Document Revised: 08/01/2015 Document Reviewed: 08/03/2013 Elsevier Interactive Patient Education  2017 Elsevier Inc.  

## 2017-04-12 DIAGNOSIS — S67190A Crushing injury of right index finger, initial encounter: Secondary | ICD-10-CM | POA: Diagnosis not present

## 2017-04-12 DIAGNOSIS — S62600A Fracture of unspecified phalanx of right index finger, initial encounter for closed fracture: Secondary | ICD-10-CM | POA: Diagnosis not present

## 2017-04-16 DIAGNOSIS — Z6824 Body mass index (BMI) 24.0-24.9, adult: Secondary | ICD-10-CM | POA: Diagnosis not present

## 2017-04-16 DIAGNOSIS — S299XXA Unspecified injury of thorax, initial encounter: Secondary | ICD-10-CM | POA: Diagnosis not present

## 2017-05-17 ENCOUNTER — Other Ambulatory Visit: Payer: Self-pay | Admitting: Nurse Practitioner

## 2017-05-17 DIAGNOSIS — J410 Simple chronic bronchitis: Secondary | ICD-10-CM

## 2017-05-18 ENCOUNTER — Other Ambulatory Visit: Payer: Self-pay | Admitting: Family Medicine

## 2017-05-18 DIAGNOSIS — J441 Chronic obstructive pulmonary disease with (acute) exacerbation: Secondary | ICD-10-CM

## 2017-06-13 ENCOUNTER — Other Ambulatory Visit: Payer: Self-pay | Admitting: Nurse Practitioner

## 2017-06-13 DIAGNOSIS — J441 Chronic obstructive pulmonary disease with (acute) exacerbation: Secondary | ICD-10-CM

## 2017-06-14 NOTE — Telephone Encounter (Signed)
Last seen 02/27/17  MMM

## 2017-06-24 ENCOUNTER — Other Ambulatory Visit: Payer: Self-pay | Admitting: Nurse Practitioner

## 2017-06-24 DIAGNOSIS — Z794 Long term (current) use of insulin: Principal | ICD-10-CM

## 2017-06-24 DIAGNOSIS — N182 Chronic kidney disease, stage 2 (mild): Principal | ICD-10-CM

## 2017-06-24 DIAGNOSIS — E1122 Type 2 diabetes mellitus with diabetic chronic kidney disease: Secondary | ICD-10-CM

## 2017-06-25 ENCOUNTER — Other Ambulatory Visit: Payer: Self-pay | Admitting: Nurse Practitioner

## 2017-06-25 DIAGNOSIS — I10 Essential (primary) hypertension: Secondary | ICD-10-CM

## 2017-06-29 NOTE — Telephone Encounter (Signed)
Last refill without being seen 

## 2017-08-25 ENCOUNTER — Ambulatory Visit (INDEPENDENT_AMBULATORY_CARE_PROVIDER_SITE_OTHER): Payer: BLUE CROSS/BLUE SHIELD

## 2017-08-25 ENCOUNTER — Ambulatory Visit: Payer: BLUE CROSS/BLUE SHIELD | Admitting: Family Medicine

## 2017-08-25 ENCOUNTER — Encounter (HOSPITAL_COMMUNITY): Payer: Self-pay | Admitting: Emergency Medicine

## 2017-08-25 ENCOUNTER — Encounter: Payer: Self-pay | Admitting: Family Medicine

## 2017-08-25 ENCOUNTER — Inpatient Hospital Stay (HOSPITAL_COMMUNITY)
Admission: EM | Admit: 2017-08-25 | Discharge: 2017-08-26 | DRG: 194 | Disposition: A | Discharge status: Home / Self Care | Payer: BLUE CROSS/BLUE SHIELD | Attending: Internal Medicine | Admitting: Internal Medicine

## 2017-08-25 VITALS — BP 107/66 | HR 128 | Temp 99.2°F | Ht 69.0 in | Wt 162.0 lb

## 2017-08-25 DIAGNOSIS — Z8551 Personal history of malignant neoplasm of bladder: Secondary | ICD-10-CM | POA: Diagnosis not present

## 2017-08-25 DIAGNOSIS — M549 Dorsalgia, unspecified: Secondary | ICD-10-CM | POA: Diagnosis present

## 2017-08-25 DIAGNOSIS — R059 Cough, unspecified: Secondary | ICD-10-CM

## 2017-08-25 DIAGNOSIS — R531 Weakness: Secondary | ICD-10-CM | POA: Diagnosis not present

## 2017-08-25 DIAGNOSIS — J40 Bronchitis, not specified as acute or chronic: Secondary | ICD-10-CM

## 2017-08-25 DIAGNOSIS — I10 Essential (primary) hypertension: Secondary | ICD-10-CM | POA: Diagnosis not present

## 2017-08-25 DIAGNOSIS — J189 Pneumonia, unspecified organism: Principal | ICD-10-CM | POA: Diagnosis present

## 2017-08-25 DIAGNOSIS — R05 Cough: Secondary | ICD-10-CM | POA: Diagnosis not present

## 2017-08-25 DIAGNOSIS — R112 Nausea with vomiting, unspecified: Secondary | ICD-10-CM

## 2017-08-25 DIAGNOSIS — E10319 Type 1 diabetes mellitus with unspecified diabetic retinopathy without macular edema: Secondary | ICD-10-CM | POA: Diagnosis present

## 2017-08-25 DIAGNOSIS — Z9119 Patient's noncompliance with other medical treatment and regimen: Secondary | ICD-10-CM | POA: Diagnosis not present

## 2017-08-25 DIAGNOSIS — N289 Disorder of kidney and ureter, unspecified: Secondary | ICD-10-CM | POA: Diagnosis not present

## 2017-08-25 DIAGNOSIS — R17 Unspecified jaundice: Secondary | ICD-10-CM | POA: Diagnosis not present

## 2017-08-25 DIAGNOSIS — Z79899 Other long term (current) drug therapy: Secondary | ICD-10-CM | POA: Diagnosis not present

## 2017-08-25 DIAGNOSIS — J181 Lobar pneumonia, unspecified organism: Secondary | ICD-10-CM

## 2017-08-25 DIAGNOSIS — F17210 Nicotine dependence, cigarettes, uncomplicated: Secondary | ICD-10-CM | POA: Diagnosis present

## 2017-08-25 DIAGNOSIS — E875 Hyperkalemia: Secondary | ICD-10-CM | POA: Diagnosis not present

## 2017-08-25 DIAGNOSIS — J440 Chronic obstructive pulmonary disease with acute lower respiratory infection: Secondary | ICD-10-CM | POA: Diagnosis present

## 2017-08-25 DIAGNOSIS — I4891 Unspecified atrial fibrillation: Secondary | ICD-10-CM | POA: Diagnosis present

## 2017-08-25 DIAGNOSIS — I4892 Unspecified atrial flutter: Secondary | ICD-10-CM | POA: Diagnosis not present

## 2017-08-25 DIAGNOSIS — J441 Chronic obstructive pulmonary disease with (acute) exacerbation: Secondary | ICD-10-CM | POA: Diagnosis present

## 2017-08-25 DIAGNOSIS — Z7982 Long term (current) use of aspirin: Secondary | ICD-10-CM | POA: Diagnosis not present

## 2017-08-25 DIAGNOSIS — R52 Pain, unspecified: Secondary | ICD-10-CM | POA: Diagnosis not present

## 2017-08-25 DIAGNOSIS — G8929 Other chronic pain: Secondary | ICD-10-CM | POA: Diagnosis present

## 2017-08-25 DIAGNOSIS — N179 Acute kidney failure, unspecified: Secondary | ICD-10-CM | POA: Diagnosis present

## 2017-08-25 DIAGNOSIS — K219 Gastro-esophageal reflux disease without esophagitis: Secondary | ICD-10-CM | POA: Diagnosis not present

## 2017-08-25 DIAGNOSIS — E119 Type 2 diabetes mellitus without complications: Secondary | ICD-10-CM | POA: Diagnosis not present

## 2017-08-25 DIAGNOSIS — J410 Simple chronic bronchitis: Secondary | ICD-10-CM | POA: Diagnosis not present

## 2017-08-25 DIAGNOSIS — I739 Peripheral vascular disease, unspecified: Secondary | ICD-10-CM | POA: Diagnosis not present

## 2017-08-25 DIAGNOSIS — J449 Chronic obstructive pulmonary disease, unspecified: Secondary | ICD-10-CM | POA: Diagnosis present

## 2017-08-25 DIAGNOSIS — Z794 Long term (current) use of insulin: Secondary | ICD-10-CM | POA: Diagnosis not present

## 2017-08-25 DIAGNOSIS — R002 Palpitations: Secondary | ICD-10-CM | POA: Diagnosis not present

## 2017-08-25 DIAGNOSIS — Z888 Allergy status to other drugs, medicaments and biological substances status: Secondary | ICD-10-CM

## 2017-08-25 DIAGNOSIS — H9190 Unspecified hearing loss, unspecified ear: Secondary | ICD-10-CM | POA: Diagnosis not present

## 2017-08-25 DIAGNOSIS — E1051 Type 1 diabetes mellitus with diabetic peripheral angiopathy without gangrene: Secondary | ICD-10-CM | POA: Diagnosis not present

## 2017-08-25 DIAGNOSIS — I480 Paroxysmal atrial fibrillation: Secondary | ICD-10-CM | POA: Diagnosis present

## 2017-08-25 DIAGNOSIS — E1159 Type 2 diabetes mellitus with other circulatory complications: Secondary | ICD-10-CM | POA: Diagnosis present

## 2017-08-25 HISTORY — DX: Disorder of kidney and ureter, unspecified: N28.9

## 2017-08-25 LAB — COMPREHENSIVE METABOLIC PANEL
ALBUMIN: 4 g/dL (ref 3.5–5.0)
ALT: 8 U/L — ABNORMAL LOW (ref 17–63)
AST: 26 U/L (ref 15–41)
Alkaline Phosphatase: 56 U/L (ref 38–126)
Anion gap: 11 (ref 5–15)
BILIRUBIN TOTAL: 2.8 mg/dL — AB (ref 0.3–1.2)
BUN: 14 mg/dL (ref 6–20)
CHLORIDE: 104 mmol/L (ref 101–111)
CO2: 22 mmol/L (ref 22–32)
Calcium: 9.6 mg/dL (ref 8.9–10.3)
Creatinine, Ser: 1.37 mg/dL — ABNORMAL HIGH (ref 0.61–1.24)
GFR calc Af Amer: >60 mL/min (ref >60)
GFR calc non Af Amer: 55 mL/min — ABNORMAL LOW (ref >60)
GLUCOSE: 163 mg/dL — AB (ref 65–99)
POTASSIUM: 5.7 mmol/L — AB (ref 3.5–5.1)
Sodium: 137 mmol/L (ref 135–145)
Total Protein: 6.8 g/dL (ref 6.5–8.1)

## 2017-08-25 LAB — I-STAT CHEM 8, ED
BUN: 18 mg/dL (ref 6–20)
CALCIUM ION: 1.1 mmol/L — AB (ref 1.15–1.40)
CHLORIDE: 104 mmol/L (ref 101–111)
Creatinine, Ser: 1.2 mg/dL (ref 0.61–1.24)
Glucose, Bld: 159 mg/dL — ABNORMAL HIGH (ref 65–99)
HCT: 50 % (ref 39.0–52.0)
Hemoglobin: 17 g/dL (ref 13.0–17.0)
Potassium: 4.7 mmol/L (ref 3.5–5.1)
SODIUM: 136 mmol/L (ref 135–145)
TCO2: 23 mmol/L (ref 22–32)

## 2017-08-25 LAB — CBG MONITORING, ED: Glucose-Capillary: 160 mg/dL — ABNORMAL HIGH (ref 65–99)

## 2017-08-25 LAB — CBC WITH DIFFERENTIAL/PLATELET
Abs Immature Granulocytes: 0.1 10*3/uL (ref 0.0–0.1)
Basophils Absolute: 0.1 10*3/uL (ref 0.0–0.1)
Basophils Relative: 0 %
EOS PCT: 0 %
Eosinophils Absolute: 0.1 10*3/uL (ref 0.0–0.7)
HCT: 49.4 % (ref 39.0–52.0)
HEMOGLOBIN: 16.3 g/dL (ref 13.0–17.0)
Immature Granulocytes: 1 %
LYMPHS PCT: 2 %
Lymphs Abs: 0.5 10*3/uL — ABNORMAL LOW (ref 0.7–4.0)
MCH: 32.9 pg (ref 26.0–34.0)
MCHC: 33 g/dL (ref 30.0–36.0)
MCV: 99.6 fL (ref 78.0–100.0)
MONO ABS: 1.2 10*3/uL — AB (ref 0.1–1.0)
Monocytes Relative: 6 %
Neutro Abs: 18.8 10*3/uL — ABNORMAL HIGH (ref 1.7–7.7)
Neutrophils Relative %: 91 %
Platelets: 252 10*3/uL (ref 150–400)
RBC: 4.96 MIL/uL (ref 4.22–5.81)
RDW: 12.5 % (ref 11.5–15.5)
WBC: 20.8 10*3/uL — ABNORMAL HIGH (ref 4.0–10.5)

## 2017-08-25 LAB — GLUCOSE, CAPILLARY: GLUCOSE-CAPILLARY: 209 mg/dL — AB (ref 65–99)

## 2017-08-25 LAB — VERITOR FLU A/B WAIVED
INFLUENZA A: NEGATIVE
Influenza B: NEGATIVE

## 2017-08-25 LAB — I-STAT TROPONIN, ED: Troponin i, poc: 0.02 ng/mL (ref 0.00–0.08)

## 2017-08-25 LAB — GLUCOSE HEMOCUE WAIVED: Glu Hemocue Waived: 126 mg/dL — ABNORMAL HIGH (ref 65–99)

## 2017-08-25 LAB — I-STAT CG4 LACTIC ACID, ED
LACTIC ACID, VENOUS: 0.92 mmol/L (ref 0.5–1.9)
Lactic Acid, Venous: 1.44 mmol/L (ref 0.5–1.9)

## 2017-08-25 MED ORDER — ONDANSETRON HCL 4 MG/2ML IJ SOLN
4.0000 mg | Freq: Four times a day (QID) | INTRAMUSCULAR | Status: DC | PRN
  Administered 2017-08-26: 4 mg via INTRAVENOUS
  Filled 2017-08-25: qty 2

## 2017-08-25 MED ORDER — SODIUM CHLORIDE 0.9 % IV SOLN: 250.0000 mL | INTRAVENOUS | Status: DC | PRN

## 2017-08-25 MED ORDER — MOMETASONE FURO-FORMOTEROL FUM 200-5 MCG/ACT IN AERO
2.0000 | INHALATION_SPRAY | Freq: Two times a day (BID) | RESPIRATORY_TRACT | Status: DC
  Filled 2017-08-25: qty 8.8

## 2017-08-25 MED ORDER — SODIUM CHLORIDE 0.9% FLUSH: 3.0000 mL | INTRAVENOUS | Status: DC | PRN

## 2017-08-25 MED ORDER — PHENOL 1.4 % MT LIQD
1.0000 | OROMUCOSAL | Status: DC | PRN
  Administered 2017-08-25: 1 via OROMUCOSAL
  Filled 2017-08-25: qty 177

## 2017-08-25 MED ORDER — ASPIRIN 81 MG PO CHEW
81.0000 mg | CHEWABLE_TABLET | Freq: Every day | ORAL | Status: DC
  Administered 2017-08-26: 81 mg via ORAL
  Filled 2017-08-25: qty 1

## 2017-08-25 MED ORDER — SODIUM CHLORIDE 0.9% FLUSH
3.0000 mL | Freq: Two times a day (BID) | INTRAVENOUS | Status: DC
  Administered 2017-08-25 – 2017-08-26 (×2): 3 mL via INTRAVENOUS

## 2017-08-25 MED ORDER — INSULIN GLARGINE 100 UNIT/ML ~~LOC~~ SOLN
20.0000 [IU] | Freq: Every day | SUBCUTANEOUS | Status: DC
  Administered 2017-08-26: 20 [IU] via SUBCUTANEOUS
  Filled 2017-08-25: qty 0.2

## 2017-08-25 MED ORDER — FAMOTIDINE 20 MG PO TABS
20.0000 mg | ORAL_TABLET | Freq: Every morning | ORAL | Status: DC
  Administered 2017-08-26: 20 mg via ORAL
  Filled 2017-08-25: qty 1

## 2017-08-25 MED ORDER — ONDANSETRON HCL 4 MG PO TABS: 4.0000 mg | ORAL_TABLET | Freq: Four times a day (QID) | ORAL | Status: DC | PRN

## 2017-08-25 MED ORDER — ACETAMINOPHEN 325 MG PO TABS: 650.0000 mg | ORAL_TABLET | Freq: Four times a day (QID) | ORAL | Status: DC | PRN

## 2017-08-25 MED ORDER — SODIUM CHLORIDE 0.9 % IV SOLN
1.0000 g | Freq: Once | INTRAVENOUS | Status: AC
  Administered 2017-08-25: 1 g via INTRAVENOUS
  Filled 2017-08-25: qty 10

## 2017-08-25 MED ORDER — HYDROCODONE-ACETAMINOPHEN 5-325 MG PO TABS
1.0000 | ORAL_TABLET | ORAL | Status: DC | PRN
  Administered 2017-08-25 – 2017-08-26 (×2): 2 via ORAL
  Filled 2017-08-25 (×2): qty 2

## 2017-08-25 MED ORDER — BISACODYL 5 MG PO TBEC: 5.0000 mg | DELAYED_RELEASE_TABLET | Freq: Every day | ORAL | Status: DC | PRN

## 2017-08-25 MED ORDER — ONDANSETRON HCL 4 MG/2ML IJ SOLN
4.0000 mg | Freq: Once | INTRAMUSCULAR | Status: AC
  Administered 2017-08-25: 4 mg via INTRAVENOUS
  Filled 2017-08-25: qty 2

## 2017-08-25 MED ORDER — INSULIN ASPART 100 UNIT/ML ~~LOC~~ SOLN: 0.0000 [IU] | Freq: Every day | SUBCUTANEOUS | Status: DC

## 2017-08-25 MED ORDER — ASPIRIN 81 MG PO TABS: 81.0000 mg | ORAL_TABLET | Freq: Every day | ORAL | Status: DC

## 2017-08-25 MED ORDER — SODIUM CHLORIDE 0.9 % IV BOLUS
500.0000 mL | Freq: Once | INTRAVENOUS | Status: AC
  Administered 2017-08-25: 500 mL via INTRAVENOUS

## 2017-08-25 MED ORDER — ENOXAPARIN SODIUM 40 MG/0.4ML ~~LOC~~ SOLN
40.0000 mg | SUBCUTANEOUS | Status: DC
  Administered 2017-08-26: 40 mg via SUBCUTANEOUS
  Filled 2017-08-25: qty 0.4

## 2017-08-25 MED ORDER — DOXYCYCLINE HYCLATE 100 MG PO TABS
100.0000 mg | ORAL_TABLET | Freq: Two times a day (BID) | ORAL | Status: DC
  Administered 2017-08-25 – 2017-08-26 (×2): 100 mg via ORAL
  Filled 2017-08-25 (×2): qty 1

## 2017-08-25 MED ORDER — ETOMIDATE 2 MG/ML IV SOLN
5.0000 mg | Freq: Once | INTRAVENOUS | Status: AC
Start: 2017-08-25 — End: 2017-08-25
  Administered 2017-08-25: 5 mg via INTRAVENOUS
  Filled 2017-08-25: qty 10

## 2017-08-25 MED ORDER — SODIUM CHLORIDE 0.9 % IV SOLN
1.0000 g | INTRAVENOUS | Status: DC
  Filled 2017-08-25: qty 10

## 2017-08-25 MED ORDER — SODIUM CHLORIDE 0.9 % IV BOLUS
1000.0000 mL | Freq: Once | INTRAVENOUS | Status: AC
Start: 2017-08-25 — End: 2017-08-25
  Administered 2017-08-25: 1000 mL via INTRAVENOUS

## 2017-08-25 MED ORDER — INSULIN ASPART 100 UNIT/ML ~~LOC~~ SOLN
0.0000 [IU] | Freq: Three times a day (TID) | SUBCUTANEOUS | Status: DC
  Administered 2017-08-26: 5 [IU] via SUBCUTANEOUS
  Administered 2017-08-26: 7 [IU] via SUBCUTANEOUS
  Administered 2017-08-26: 3 [IU] via SUBCUTANEOUS

## 2017-08-25 MED ORDER — ACETAMINOPHEN 650 MG RE SUPP: 650.0000 mg | Freq: Four times a day (QID) | RECTAL | Status: DC | PRN

## 2017-08-25 MED ORDER — ALBUTEROL SULFATE (2.5 MG/3ML) 0.083% IN NEBU: 2.5000 mg | INHALATION_SOLUTION | RESPIRATORY_TRACT | Status: DC | PRN

## 2017-08-25 MED ORDER — SENNOSIDES-DOCUSATE SODIUM 8.6-50 MG PO TABS: 1.0000 | ORAL_TABLET | Freq: Every evening | ORAL | Status: DC | PRN

## 2017-08-25 NOTE — Progress Notes (Signed)
Subjective: CC: nausea PCP: Thomas Pretty, FNP GEX:BMWUXL [name]. is a 62 y.o. male presenting to clinic today for:  1. Nausea Patient with onset of vomiting that is posttussive.  He was in his usual state of health until yesterday evening when he started having a productive cough, myalgia, sensation of feeling weak.  Denies any chest pain.  He notes shortness of breath with activity but notes that this is baseline.  He has a history of COPD and is compliant with controller inhaler.  He has albuterol at home but has not been using this.  Denies any diarrhea.  Blood sugars have been under control.  He has not eaten today secondary to symptoms.  He has not taken his insulin today.  He had a similar episode a couple of years ago that required hospitalization.  No hemoptysis.  No wheezes.  No measured fevers at home.   ROS: Per HPI  Allergies  Allergen Reactions  . Azithromycin Other (See Comments) and Nausea And Vomiting    "Severe stomach cramps; told to list as an allergy by dr. Huel Cote ago"  . Avelox [Moxifloxacin Hcl In Nacl] Other (See Comments)    Hemolysis  In 2012  . Bactrim [Sulfamethoxazole-Trimethoprim] Diarrhea and Nausea And Vomiting   Past Medical History:  Diagnosis Date  . Arthritis   . Bladder cancer ([place])   . Chronic back pain   . COPD (chronic obstructive pulmonary disease) ([place])   . DDD (degenerative disc disease)   . Diabetic retinopathy of both eyes ([place])   . Essential hypertension   . GERD (gastroesophageal reflux disease)   . History of atrial flutter 02/2011   Converted to NSR with Cardizem  . History of chronic bronchitis   . History of hemolytic anemia 02/2011   secondary to Avelox  . HOH (hard of hearing)   . HOH (hard of hearing)    no eardrum and nerve damage on R, also HOH on L  . Mitral valve prolapse    a. 2D Echo 11/27/14: EF 55-60%; images were inadequate for LV wall motion assessment, + mild late systolic mitral valve prolapse  involving the anterior leaflet.  Marland Kitchen PAD (peripheral artery disease) ([place]) 04/2014   Dr Trula Slade; bilateral SFA occlusion, R mid, L distal  . PAF (paroxysmal atrial fibrillation) ([place])    a. Dx 11/2014 during admission for perf ulcer.  . Perforated ulcer ([place])    a. 11/2014 s/p surgery.  . Productive cough   . Smokers' cough ([place])   . Type 1 diabetes mellitus ([place]) 1977    Current Outpatient Medications:  .  albuterol (PROVENTIL HFA;VENTOLIN HFA) 108 (90 Base) MCG/ACT inhaler, Inhale 2 puffs every 6 (six) hours as needed into the lungs for wheezing or shortness of breath., Disp: 8.5 Inhaler, Rfl: 2 .  aspirin 81 MG tablet, Take 81 mg by mouth daily., Disp: , Rfl:  .  famotidine (PEPCID) 20 MG tablet, Take 1 tablet (20 mg total) by mouth every morning. Reported on 04/16/2015, Disp: 30 tablet, Rfl: 5 .  Fluticasone-Salmeterol (ADVAIR DISKUS) 250-50 MCG/DOSE AEPB, INHALE 1 PUFF INTO THE LUNGS EVERY EVENING., Disp: 180 each, Rfl: 0 .  insulin aspart (NOVOLOG) 100 UNIT/ML injection, PER SLIDING SCALE: 190 - 200 = 2 units 300 and above = 7 units, Disp: 10 mL, Rfl: 2 .  insulin glargine (LANTUS) 100 UNIT/ML injection, INJECT 40 UNITS SUBCUTANEOUSLY EVERY MORNING, Disp: 40 mL, Rfl: 1 .  lisinopril (PRINIVIL,ZESTRIL) 5 MG tablet, TAKE 1 TABLET  BY MOUTH EVERY DAY, Disp: 90 tablet, Rfl: 0 .  loratadine (CLARITIN) 10 MG tablet, Take 10 mg by mouth at bedtime.  , Disp: , Rfl:  .  mometasone-formoterol (DULERA) 100-5 MCG/ACT AERO, Inhale 2 puffs 2 (two) times daily into the lungs., Disp: 1 Inhaler, Rfl: 2 .  TRELEGY ELLIPTA 100-62.5-25 MCG/INH AEPB, TAKE 1 PUFF BY MOUTH EVERY DAY, Disp: 60 each, Rfl: 0 Social History   Socioeconomic History  . Marital status: Married    Spouse name: Not on file  . Number of children: Not on file  . Years of education: Not on file  . Highest education level: Not on file  Occupational History  . Occupation: [employer]  . Financial resource strain: Not on  file  . Food insecurity:    Worry: Not on file    Inability: Not on file  . Transportation needs:    Medical: Not on file    Non-medical: Not on file  Tobacco Use  . Smoking status: Current Every Day Smoker    Packs/day: 2.00    Years: 30.00    Pack years: 60.00    Types: Cigarettes  . Smokeless tobacco: Former Systems developer    Quit date: 06/08/1978  Substance and Sexual Activity  . Alcohol use: No    Alcohol/week: 0.0 oz  . Drug use: No  . Sexual activity: Not on file  Lifestyle  . Physical activity:    Days per week: Not on file    Minutes per session: Not on file  . Stress: Not on file  Relationships  . Social connections:    Talks on phone: Not on file    Gets together: Not on file    Attends religious service: Not on file    Active member of club or organization: Not on file    Attends meetings of clubs or organizations: Not on file    Relationship status: Not on file  . Intimate partner violence:    Fear of current or ex partner: Not on file    Emotionally abused: Not on file    Physically abused: Not on file    Forced sexual activity: Not on file  Other Topics Concern  . Not on file  Social History Narrative   Lives in [place] with wife and 2 sons.    Family History  Problem Relation Age of Onset  . Breast cancer Mother   . Cancer Mother        Breast  . Rheumatic fever Father   . Heart disease Father   . Heart attack Father        Massive   . Diabetes Son     Objective: Office vital signs reviewed. BP 107/66   Pulse (!) 128   Temp 99.2 F (37.3 C) (Oral)   Ht 5\' 9"  (1.753 m)   Wt 162 lb (73.5 kg)   SpO2 93%   BMI 23.92 kg/m   Physical Examination:  General: Awake, alert, appears ill, diaphoretic HEENT: Mucous membranes are slightly dry Cardio: Tachycardic with regular rhythm, S1S2 heard, no murmurs appreciated Pulm: Globally decreased breath sounds but no appreciable wheezes, rhonchi or rales.  He has normal work of breathing on room  air. Extremities: warm, well perfused, No edema, cyanosis or clubbing; +2 pulses bilaterally MSK: Slow gait and hunched station Neuro: Alert and oriented x3.  He is difficult of hearing.  He appears drowsy but does respond to stimuli.  Dg Chest 2 View  Result Date: 08/25/2017 CLINICAL DATA:  Cough.  Vomiting.  Diaphoresis. EXAM: CHEST - 2 VIEW COMPARISON:  None. FINDINGS: The heart size and mediastinal contours are within normal limits. Both lungs are clear. The visualized skeletal structures are unremarkable. IMPRESSION: Negative two view chest x-ray Electronically Signed   By: San Morelle M.D.   On: 08/25/2017 14:27    Assessment/ Plan: 62 y.o. male   1. Weakness Possibly related to acute illness and dehydration.  Blood sugar was 126 here in office.  - Glucose Hemocue Waived  2. Body aches Chronic issue for patient.  He has known degenerative changes in the lumbar spine.  However, may be exacerbated by acute illness.  Rapid flu was negative here in office. - Veritor Flu A/B Waived  3. Cough Patient with globally decreased breath sounds.  His oxygen was 93% on room air.  No increased work of breathing on room air but he did have quite a bit of coughing with posttussive emesis during evaluation.  Chest x-ray was obtained and did not demonstrate any acute infiltrate suggestive of pneumonia.  However, given tachycardia, borderline blood pressures and constellation of symptoms I did recommend that they seek immediate medical attention the emergency department.  Would consider obtaining CT chest.  Concern for possible dehydration given lower extremity cramping and tachycardia.  I did offer transport by EMS but patient declined this.  We discussed the risks of going by private vehicle but patient still wished to go by private vehicle.  He was discharged in the care of his wife, who will transport patient to [facility].  I called [facility] emergency department to discuss patient with the charge  nurse. - DG Chest 2 View; Future   Orders Placed This Encounter  Procedures  . DG Chest 2 View    Standing Status:   Future    Number of Occurrences:   1    Standing Expiration Date:   10/26/2018    Order Specific Question:   Reason for Exam (SYMPTOM  OR DIAGNOSIS REQUIRED)    Answer:   cough, tachycardia, fever. current smoker    Order Specific Question:   Preferred imaging location?    Answer:   Internal    Order Specific Question:   Radiology Contrast Protocol - do NOT remove file path    Answer:   [email or web address]  . Glucose Hemocue Waived  . Veritor Flu A/B Waived    Order Specific Question:   Source    Answer:   nasal    Ashly Windell [place], [place] [phone]

## 2017-08-25 NOTE — ED Provider Notes (Signed)
Patient placed in Quick Look pathway, seen and evaluated   Chief Complaint: +malaise, high heart rate  HPI:   62 year old male presents with generalized malaise and fatigue. He was sent by his PCP for evaluation after he went there and EKG shows A.fib with RVR. CXR was normal. He's had a cough, N/V. He is not on blood thinners. No fever, chest pain, SOB, abdominal pain, diarrhea.   ROS: +N/V  Physical Exam:   Gen: No distress. Very hard of hearing. Slow to respond  Neuro: Awake and Alert  Skin: Warm    Focused Exam: Heart: Fast, irregular rate and rhythm    Lungs: CTA    Abdomen: Soft, non-tender   Initiation of care has begun. The patient has been counseled on the process, plan, and necessity for staying for the completion/evaluation, and the remainder of the medical screening examination    Recardo Evangelist, PA-C 08/25/17 Rake, Julie, MD 08/28/17 [phone]

## 2017-08-25 NOTE — ED Notes (Signed)
Patient vomited x 1. 150 mL

## 2017-08-25 NOTE — ED Notes (Signed)
Pt aware of need for urine sample and he has been given a urinal to collect specimen.

## 2017-08-25 NOTE — H&P (Signed)
History and Physical    Thomas Sandoval. VPX:[id number] DOB: 1955/11/09 DOA: 08/25/2017  PCP: Chevis Pretty, FNP   Patient coming from: Home   Chief Complaint: SOB, productive cough, chills   HPI: Thomas Sandoval. is a 62 y.o. male with medical history significant for COPD, hypertension, insulin-dependent diabetes mellitus, and episode of atrial fibrillation in which she converted to NSR with diltiazem, now presenting to the emergency department for evaluation of shortness of breath, productive cough, nausea, and chills.  Patient reports that he developed shortness of breath, chills, and cough productive of thick yellow sputum last night.  He reports that symptoms were worse this morning and continue to progress over the course of the day, becoming severe by noon.  He denies chest pain, palpitations, or pain or swelling in the lower extremities.  He denies abdominal pain or diarrhea, but reports an episode of nonbloody vomiting and ongoing nausea.  ED Course: Upon arrival to the ED, patient is found to tachycardic in the 130s, and with stable blood pressure.  EKG features atrial fibrillation with RVR, rate 151.  Chest x-ray have a temp of 37.7, saturating low 90s on room air, tachypneic, is negative for any definite acute pathology.  Chemistry panel is notable for potassium of 5.7 and creatinine 1.37, up from 0.89 on most recent prior from 2017.  CBC is notable for leukocytosis to 20,600.  Lactic acid is reassuringly normal and troponin is within the normal limits.  Patient was electrically cardioverted successfully in the ED.  Blood cultures were collected, 1 L of normal saline administered, and patient was started on Rocephin.  He remains hemodynamically stable in sinus rhythm and will be admitted for ongoing evaluation and management of pneumonia with suspected AKI and mild hyperkalemia.  Review of Systems:  All other systems reviewed and apart from HPI, are negative.  Past  Medical History:  Diagnosis Date  . Arthritis   . Bladder cancer ([place])   . Chronic back pain   . COPD (chronic obstructive pulmonary disease) ([place])   . DDD (degenerative disc disease)   . Diabetic retinopathy of both eyes ([place])   . Essential hypertension   . GERD (gastroesophageal reflux disease)   . History of atrial flutter 02/2011   Converted to NSR with Cardizem  . History of chronic bronchitis   . History of hemolytic anemia 02/2011   secondary to Avelox  . HOH (hard of hearing)   . HOH (hard of hearing)    no eardrum and nerve damage on R, also HOH on L  . Mitral valve prolapse    a. 2D Echo 11/27/14: EF 55-60%; images were inadequate for LV wall motion assessment, + mild late systolic mitral valve prolapse involving the anterior leaflet.  Marland Kitchen PAD (peripheral artery disease) ([place]) 04/2014   Dr Trula Slade; bilateral SFA occlusion, R mid, L distal  . PAF (paroxysmal atrial fibrillation) ([place])    a. Dx 11/2014 during admission for perf ulcer.  . Perforated ulcer ([place])    a. 11/2014 s/p surgery.  . Productive cough   . Smokers' cough ([place])   . Type 1 diabetes mellitus ([place]) 1977    Past Surgical History:  Procedure Laterality Date  . CYSTOSCOPY WITH URETEROSCOPY Right 08/14/2013   Procedure: CYSTOSCOPY WITH URETEROSCOPY BLADDER BIOPSY ;  Surgeon: Claybon Jabs, MD;  [facility];  Service: Urology;  Laterality: Right;  . ESOPHAGOGASTRODUODENOSCOPY N/A 02/06/2013   Procedure: ESOPHAGOGASTRODUODENOSCOPY (EGD);  Surgeon: Lafayette Dragon,  MD;  Location: [facility];  Service: Endoscopy;  Laterality: N/A;  . LAPAROSCOPY N/A 11/25/2014   Procedure: LAPAROSCOPIC PRIMARY REPAIR OF PERFORATED PREPYLORIC ULCER WITH Silvestre Gunner;  Surgeon: Greer Pickerel, MD;  Location: [facility];  Service: General;  Laterality: N/A;  . TONSILLECTOMY  as child  . TRANSTHORACIC ECHOCARDIOGRAM  02-17-2011   MODERATE LVH/  EF 65%  . TRANSURETHRAL RESECTION OF BLADDER TUMOR WITH GYRUS (TURBT-GYRUS)  N/A 06/12/2013   Procedure: TRANSURETHRAL RESECTION OF BLADDER TUMOR WITH GYRUS (TURBT-GYRUS);  Surgeon: Claybon Jabs, MD;  Location[facility];  Service: Urology;  Laterality: N/A;  . TYMPANIC MEMBRANE REPAIR  as child     reports that he has been smoking cigarettes.  He has a 60.00 pack-year smoking history. He quit smokeless tobacco use about 39 years ago. He reports that he does not drink alcohol or use drugs.  Allergies  Allergen Reactions  . Azithromycin Other (See Comments) and Nausea And Vomiting    "Severe stomach cramps; told to list as an allergy by dr. Huel Cote ago"  . Avelox [Moxifloxacin Hcl In Nacl] Other (See Comments)    Hemolysis  In 2012  . Bactrim [Sulfamethoxazole-Trimethoprim] Diarrhea and Nausea And Vomiting    Family History  Problem Relation Age of Onset  . Breast cancer Mother   . Cancer Mother        Breast  . Rheumatic fever Father   . Heart disease Father   . Heart attack Father        Massive   . Diabetes Son      Prior to Admission medications   Medication Sig Start Date End Date Taking? Authorizing Provider  albuterol (PROVENTIL HFA;VENTOLIN HFA) 108 (90 Base) MCG/ACT inhaler Inhale 2 puffs every 6 (six) hours as needed into the lungs for wheezing or shortness of breath. 05/17/17  Yes Hassell Done, [name], FNP  aspirin 81 MG tablet Take 81 mg by mouth daily.   Yes [provider]  famotidine (PEPCID) 20 MG tablet Take 1 tablet (20 mg total) by mouth every morning. Reported on 04/16/2015 04/16/15  Yes Hassell Done, [name], FNP  Fluticasone-Salmeterol (ADVAIR DISKUS) 250-50 MCG/DOSE AEPB INHALE 1 PUFF INTO THE LUNGS EVERY EVENING. 02/16/17  Yes Hassell Done, [name], FNP  insulin aspart (NOVOLOG) 100 UNIT/ML injection PER SLIDING SCALE: 190 - 200 = 2 units 300 and above = 7 units Patient taking differently: Inject 2-7 Units into the skin 3 (three) times daily with meals. PER SLIDING SCALE: 190 - 200 = 2 units 300 and above = 7  units 03/16/16  Yes Hawks, Cheraw A, FNP  insulin glargine (LANTUS) 100 UNIT/ML injection INJECT 40 UNITS SUBCUTANEOUSLY EVERY MORNING 06/29/17  Yes Hassell Done, [name], FNP  lisinopril (PRINIVIL,ZESTRIL) 5 MG tablet TAKE 1 TABLET BY MOUTH EVERY DAY 06/28/17  Yes Hassell Done, [name], FNP  loratadine (CLARITIN) 10 MG tablet Take 10 mg by mouth at bedtime.     Yes [provider]  mometasone-formoterol (DULERA) 100-5 MCG/ACT AERO Inhale 2 puffs 2 (two) times daily into the lungs. 01/23/17  Yes Hassell Done, [name], FNP  Viviana Simpler ELLIPTA 100-62.5-25 MCG/INH AEPB TAKE 1 PUFF BY MOUTH EVERY DAY 06/14/17  Yes Chevis Pretty, FNP    Physical Exam: Vitals:   08/25/17 2005 08/25/17 2010 08/25/17 2017 08/25/17 2045  BP: (!) 143/68 (!) 142/61 121/60 131/66  Pulse: 90 93 96 89  Resp: (!) 21 (!) 21 (!) 27 (!) 24  Temp:      TempSrc:      SpO2: 100%  94% 94% 96%      Constitutional: Not in acute distress, appears uncomfortable  Eyes: PERTLA, lids and conjunctivae normal ENMT: Mucous membranes are moist. Posterior pharynx clear of any exudate or lesions.   Neck: normal, supple, no masses, no thyromegaly Respiratory: Tachypnea, dyspnea with speech. Rhonchi at right base. No accessory muscle use.  Cardiovascular: S1 & S2 heard, regular rate and rhythm. No extremity edema. No significant JVD. Abdomen: No distension, no tenderness, soft. Bowel sounds active.  Musculoskeletal: no clubbing / cyanosis. No joint deformity upper and lower extremities.  Skin: no significant rashes, lesions, ulcers. Warm, dry, well-perfused. Neurologic: CN 2-12 grossly intact. Sensation intact. Strength 5/5 in all 4 limbs.  Psychiatric: Somnolent, easily roused and oriented x 3. Calm, cooperative.     Labs on Admission: I have personally reviewed following labs and imaging studies  CBC: Recent Labs  Lab 08/25/17 1619 08/25/17 1631  WBC 20.8*  --   NEUTROABS 18.8*  --   HGB 16.3 17.0  HCT 49.4 50.0    MCV 99.6  --   PLT 252  --    Basic Metabolic Panel: Recent Labs  Lab 08/25/17 1619 08/25/17 1631  NA 137 136  K 5.7* 4.7  CL 104 104  CO2 22  --   GLUCOSE 163* 159*  BUN 14 18  CREATININE 1.37* 1.20  CALCIUM 9.6  --    GFR: Estimated Creatinine Clearance: 66.3 mL/min (by C-G formula based on SCr of 1.2 mg/dL). Liver Function Tests: Recent Labs  Lab 08/25/17 1619  AST 26  ALT 8*  ALKPHOS 56  BILITOT 2.8*  PROT 6.8  ALBUMIN 4.0   No results for input(s): LIPASE, AMYLASE in the last 168 hours. No results for input(s): AMMONIA in the last 168 hours. Coagulation Profile: No results for input(s): INR, PROTIME in the last 168 hours. Cardiac Enzymes: No results for input(s): CKTOTAL, CKMB, CKMBINDEX, TROPONINI in the last 168 hours. BNP (last 3 results) No results for input(s): PROBNP in the last 8760 hours. HbA1C: No results for input(s): HGBA1C in the last 72 hours. CBG: Recent Labs  Lab 08/25/17 1601  GLUCAP 160*   Lipid Profile: No results for input(s): CHOL, HDL, LDLCALC, TRIG, CHOLHDL, LDLDIRECT in the last 72 hours. Thyroid Function Tests: No results for input(s): TSH, T4TOTAL, FREET4, T3FREE, THYROIDAB in the last 72 hours. Anemia Panel: No results for input(s): VITAMINB12, FOLATE, FERRITIN, TIBC, IRON, RETICCTPCT in the last 72 hours. Urine analysis:    Component Value Date/Time   COLORURINE AMBER (A) 11/25/2014 0348   APPEARANCEUR CLEAR 11/25/2014 0348   LABSPEC 1.020 11/25/2014 0348   PHURINE 5.5 11/25/2014 0348   GLUCOSEU 100 (A) 11/25/2014 0348   HGBUR NEGATIVE 11/25/2014 0348   BILIRUBINUR SMALL (A) 11/25/2014 0348   BILIRUBINUR neg 04/21/2013 1634   KETONESUR 15 (A) 11/25/2014 0348   PROTEINUR 30 (A) 11/25/2014 0348   UROBILINOGEN 1.0 11/25/2014 0348   NITRITE NEGATIVE 11/25/2014 0348   LEUKOCYTESUR NEGATIVE 11/25/2014 0348   Sepsis Labs: @LABRCNTIP (procalcitonin:4,lacticidven:4) )No results found for this or any previous visit (from  the past 240 hour(s)).   Radiological Exams on Admission: Dg Chest 2 View  Result Date: 08/25/2017 CLINICAL DATA:  Cough.  Vomiting.  Diaphoresis. EXAM: CHEST - 2 VIEW COMPARISON:  None. FINDINGS: The heart size and mediastinal contours are within normal limits. Both lungs are clear. The visualized skeletal structures are unremarkable. IMPRESSION: Negative two view chest x-ray Electronically Signed   By: Wynetta Fines.D.  On: 08/25/2017 14:27    EKG: Independently reviewed. Atrial fibrillation with RVR (rate 151). Sinus rhythm with LAFB after electrical cardioversion.   Assessment/Plan  1. CAP  - Presents with SOB, productive cough, and chills  - Found to have marked leukocytosis, rhonchi, but no definite infiltrate on CXR  - Blood cultures collected in ED, will add sputum culture and check strep pneumo antigen and procalcitonin  - Started on Rocephin in ED, will continue and add doxycycline (reports allergy to macrolides)    2. Atrial fibrillation with RVR  - Presents in a fib with rate 130s-150s  - Pt has hx of a flutter in 2012 that converted to sinus with Cardizem  - Left atrial dimensions at upper limits of normal on TTE from 2016  - This episode likely precipitated by acute respiratory illness and hyperkalemia  - He was successfully cardioverted with shock in ED  - CHADS-VASc 2 (HTN and DM); hold off on anticoagulation for now   3. COPD - No wheezing or obstructive breathing pattern  - Continue ICS/LABA and prn albuterol nebs    4. Mild renal insufficiency; hyperkalemia  - SCr is 1.37 on admission, up from 0.89 on most recent prior from 2017  - Could be acute prerenal azotemia in setting of acute illness  - Treated in ED with 1 liter NS  - Check urine chemistries, renally-dose medications, hold lisinopril initially, repeat chem panel in am   - Potassium is 5.7 on admission; continue cardiac monitoring, given additional fluid bolus, and follow serial potassium levels    5. Hypertension  - BP at goal  - Hold lisinopril in light of elevated creatinine   6. Insulin-dependent DM  - A1c was 6.3% remotely  - Managed at home with Lantus 40 units qAM and Novolog 2-7 units TID  - Check CBG's, continue Lantus and Novolog per sliding scale     DVT prophylaxis: Lovenox Code Status: Full  Family Communication: Discussed with patient Consults called: None Admission status: Inpatient    Vianne Bulls, MD [facility] Pager [phone]  If 7PM-7AM, please contact night-coverage [email or web address] Password [email or web address]  08/25/2017, 10:20 PM

## 2017-08-25 NOTE — ED Notes (Signed)
Attempted second blood culture, unsuccessful. Started IV antibiotics.

## 2017-08-25 NOTE — ED Triage Notes (Signed)
Pt presents with n/v and weakness that began today; hx diabetes, denies polyuria, polydipsia;

## 2017-08-25 NOTE — ED Notes (Signed)
ED Provider at bedside. 

## 2017-08-25 NOTE — ED Notes (Signed)
Pt Rectal temp. is 99.9. Notified Sharyn Lull, Therapist, sports.

## 2017-08-25 NOTE — ED Provider Notes (Signed)
[facility] EMERGENCY DEPARTMENT Provider Note   CSN: [id number] Arrival date & time: 08/25/17  1545     History   Chief Complaint Chief Complaint  Patient presents with  . Emesis  . Fatigue    HPI Thomas Sandoval. is a 62 y.o. male.  HPI Patient with generalized weakness nausea vomiting and cough.  States he feels bad all over.  Seen by PCP and sent in.  Has cough with some yellow sputum production.  History of COPD.  History of paroxysmal A. fib but is been off his medicines for it.  States he is not on anticoagulation. Chadsvasc of 3 for HTN, DM and vascular.States when he has had bronchitis in the past he has felt like this and his heart rate go fast.  Has had nausea vomiting and feels dehydrated.  No diarrhea.  No abdominal pain.  Has had some chills but not frank fever.  States he checked his blood pressure yesterday and he had a normal heart rate at that time. Past Medical History:  Diagnosis Date  . Arthritis   . Bladder cancer ([place])   . Chronic back pain   . COPD (chronic obstructive pulmonary disease) ([place])   . DDD (degenerative disc disease)   . Diabetic retinopathy of both eyes ([place])   . Essential hypertension   . GERD (gastroesophageal reflux disease)   . History of atrial flutter 02/2011   Converted to NSR with Cardizem  . History of chronic bronchitis   . History of hemolytic anemia 02/2011   secondary to Avelox  . HOH (hard of hearing)   . HOH (hard of hearing)    no eardrum and nerve damage on R, also HOH on L  . Mitral valve prolapse    a. 2D Echo 11/27/14: EF 55-60%; images were inadequate for LV wall motion assessment, + mild late systolic mitral valve prolapse involving the anterior leaflet.  Marland Kitchen PAD (peripheral artery disease) ([place]) 04/2014   Dr Trula Slade; bilateral SFA occlusion, R mid, L distal  . PAF (paroxysmal atrial fibrillation) ([place])    a. Dx 11/2014 during admission for perf ulcer.  . Perforated ulcer ([place])    a. 11/2014  s/p surgery.  . Productive cough   . Smokers' cough ([place])   . Type 1 diabetes mellitus ([place]) 1977    Patient Active Problem List   Diagnosis Date Noted  . Prepyloric ulcer 12/12/2014  . Peritonitis ([place]) 12/12/2014  . Essential hypertension 12/07/2014  . Mitral valve prolapse 12/06/2014  . Sinus bradycardia 12/06/2014  . Atrial fibrillation with rapid ventricular response ([place]) 11/26/2014  . Pain in joint, lower leg 05/07/2014  . GERD (gastroesophageal reflux disease) 02/06/2013  . Heme positive stool 02/04/2013  . DM type 2 (diabetes mellitus, type 2) ([place]) 02/04/2013  . COPD (chronic obstructive pulmonary disease) ([place]) 02/20/2011    Past Surgical History:  Procedure Laterality Date  . CYSTOSCOPY WITH URETEROSCOPY Right 08/14/2013   Procedure: CYSTOSCOPY WITH URETEROSCOPY BLADDER BIOPSY ;  Surgeon: Claybon Jabs, MD;  Location[facility];  Service: Urology;  Laterality: Right;  . ESOPHAGOGASTRODUODENOSCOPY N/A 02/06/2013   Procedure: ESOPHAGOGASTRODUODENOSCOPY (EGD);  Surgeon: Lafayette Dragon, MD;  Location: [facility] ENDOSCOPY;  Service: Endoscopy;  Laterality: N/A;  . LAPAROSCOPY N/A 11/25/2014   Procedure: LAPAROSCOPIC PRIMARY REPAIR OF PERFORATED PREPYLORIC ULCER WITH Silvestre Gunner;  Surgeon: Greer Pickerel, MD;  Location: [facility];  Service: General;  Laterality: N/A;  . TONSILLECTOMY  as child  .  TRANSTHORACIC ECHOCARDIOGRAM  02-17-2011   MODERATE LVH/  EF 65%  . TRANSURETHRAL RESECTION OF BLADDER TUMOR WITH GYRUS (TURBT-GYRUS) N/A 06/12/2013   Procedure: TRANSURETHRAL RESECTION OF BLADDER TUMOR WITH GYRUS (TURBT-GYRUS);  Surgeon: Claybon Jabs, MD;  Location: [facility];  Service: Urology;  Laterality: N/A;  . TYMPANIC MEMBRANE REPAIR  as child        Home Medications    Prior to Admission medications   Medication Sig Start Date End Date Taking? Authorizing Provider  albuterol (PROVENTIL HFA;VENTOLIN HFA) 108 (90 Base) MCG/ACT inhaler Inhale 2 puffs  every 6 (six) hours as needed into the lungs for wheezing or shortness of breath. 05/17/17  Yes Hassell Done, [name], FNP  aspirin 81 MG tablet Take 81 mg by mouth daily.   Yes [provider]  famotidine (PEPCID) 20 MG tablet Take 1 tablet (20 mg total) by mouth every morning. Reported on 04/16/2015 04/16/15  Yes Hassell Done, [name], FNP  Fluticasone-Salmeterol (ADVAIR DISKUS) 250-50 MCG/DOSE AEPB INHALE 1 PUFF INTO THE LUNGS EVERY EVENING. 02/16/17  Yes Hassell Done, [name], FNP  insulin aspart (NOVOLOG) 100 UNIT/ML injection PER SLIDING SCALE: 190 - 200 = 2 units 300 and above = 7 units Patient taking differently: Inject 2-7 Units into the skin 3 (three) times daily with meals. PER SLIDING SCALE: 190 - 200 = 2 units 300 and above = 7 units 03/16/16  Yes Hawks, [place] A, FNP  insulin glargine (LANTUS) 100 UNIT/ML injection INJECT 40 UNITS SUBCUTANEOUSLY EVERY MORNING 06/29/17  Yes Hassell Done, [name], FNP  lisinopril (PRINIVIL,ZESTRIL) 5 MG tablet TAKE 1 TABLET BY MOUTH EVERY DAY 06/28/17  Yes Hassell Done, [name], FNP  loratadine (CLARITIN) 10 MG tablet Take 10 mg by mouth at bedtime.     Yes [provider]  mometasone-formoterol (DULERA) 100-5 MCG/ACT AERO Inhale 2 puffs 2 (two) times daily into the lungs. 01/23/17  Yes Hassell Done, [name], FNP  TRELEGY ELLIPTA 100-62.5-25 MCG/INH AEPB TAKE 1 PUFF BY MOUTH EVERY DAY 06/14/17  Yes Chevis Pretty, FNP    Family History Family History  Problem Relation Age of Onset  . Breast cancer Mother   . Cancer Mother        Breast  . Rheumatic fever Father   . Heart disease Father   . Heart attack Father        Massive   . Diabetes Son     Social History Social History   Tobacco Use  . Smoking status: Current Every Day Smoker    Packs/day: 2.00    Years: 30.00    Pack years: 60.00    Types: Cigarettes  . Smokeless tobacco: Former Systems developer    Quit date: 06/08/1978  Substance Use Topics  . Alcohol use: No     Alcohol/week: 0.0 oz  . Drug use: No     Allergies   Azithromycin; Avelox [moxifloxacin hcl in nacl]; and Bactrim [sulfamethoxazole-trimethoprim]   Review of Systems Review of Systems  Constitutional: Positive for appetite change and chills.  HENT: Negative for congestion.   Eyes: Negative for visual disturbance.  Respiratory: Positive for cough and shortness of breath.   Cardiovascular: Positive for palpitations.  Gastrointestinal: Positive for nausea and vomiting. Negative for abdominal pain.  Genitourinary: Negative for flank pain.  Musculoskeletal: Positive for myalgias. Negative for back pain.  Skin: Negative for rash.  Neurological: Positive for weakness.  Hematological: Negative for adenopathy.  Psychiatric/Behavioral: Negative for confusion.     Physical Exam Updated Vital Signs BP 131/66   Pulse 89  Temp 99.8 F (37.7 C) (Oral)   Resp (!) 24   SpO2 96%   Physical Exam  Constitutional: He appears well-developed.  HENT:  Head: Normocephalic.  Eyes: EOM are normal.  Neck: Neck supple.  Cardiovascular:  Irregular tachycardia  Pulmonary/Chest:  Mild diffuse harsh breath sounds with frequent cough.  Abdominal: There is no tenderness.  Musculoskeletal: He exhibits no tenderness.  Neurological: He is alert.  Skin: Skin is warm. Capillary refill takes less than 2 seconds.     ED Treatments / Results  Labs (all labs ordered are listed, but only abnormal results are displayed) Labs Reviewed  COMPREHENSIVE METABOLIC PANEL - Abnormal; Notable for the following components:      Result Value   Potassium 5.7 (*)    Glucose, Bld 163 (*)    Creatinine, Ser 1.37 (*)    ALT 8 (*)    Total Bilirubin 2.8 (*)    GFR calc non Af Amer 55 (*)    All other components within normal limits  CBC WITH DIFFERENTIAL/PLATELET - Abnormal; Notable for the following components:   WBC 20.8 (*)    Neutro Abs 18.8 (*)    Lymphs Abs 0.5 (*)    Monocytes Absolute 1.2 (*)    All  other components within normal limits  I-STAT CHEM 8, ED - Abnormal; Notable for the following components:   Glucose, Bld 159 (*)    Calcium, Ion 1.10 (*)    All other components within normal limits  CBG MONITORING, ED - Abnormal; Notable for the following components:   Glucose-Capillary 160 (*)    All other components within normal limits  CULTURE, BLOOD (ROUTINE X 2)  CULTURE, BLOOD (ROUTINE X 2)  URINALYSIS, ROUTINE W REFLEX MICROSCOPIC  I-STAT TROPONIN, ED  I-STAT CG4 LACTIC ACID, ED  I-STAT CG4 LACTIC ACID, ED  I-STAT CG4 LACTIC ACID, ED    EKG EKG Interpretation  Date/Time:  Wednesday August 25 2017 15:58:41 EDT Ventricular Rate:  151 PR Interval:    QRS Duration: 76 QT Interval:  292 QTC Calculation: 462 R Axis:   -62 Text Interpretation:  Atrial fibrillation with rapid ventricular response Left axis deviation Nonspecific ST and T wave abnormality Abnormal ECG Confirmed by Davonna Belling [phone]) on 08/25/2017 4:32:10 PM   EKG Interpretation  Date/Time:  Wednesday August 25 2017 20:13:46 EDT Ventricular Rate:  94 PR Interval:    QRS Duration: 75 QT Interval:  341 QTC Calculation: 427 R Axis:   -51 Text Interpretation:  Sinus rhythm Biatrial enlargement Left anterior fascicular block Low voltage, precordial leads Abnormal R-wave progression, early transition afib now resolved Confirmed by Davonna Belling [phone]) on 08/25/2017 9:40:07 PM       Radiology Dg Chest 2 View  Result Date: 08/25/2017 CLINICAL DATA:  Cough.  Vomiting.  Diaphoresis. EXAM: CHEST - 2 VIEW COMPARISON:  None. FINDINGS: The heart size and mediastinal contours are within normal limits. Both lungs are clear. The visualized skeletal structures are unremarkable. IMPRESSION: Negative two view chest x-ray Electronically Signed   By: San Morelle M.D.   On: 08/25/2017 14:27    Procedures Sedation procedure  Date/Time: 08/25/2017 9:37 PM  Performed by: Davonna Belling, MD  Authorized by:  Davonna Belling, MD   Consent:    Consent obtained:  Verbal   Consent given by:  Patient   Risks discussed:  Allergic reaction, dysrhythmia, inadequate sedation, nausea, vomiting, respiratory compromise necessitating ventilatory assistance and intubation, prolonged hypoxia resulting in organ damage and prolonged sedation necessitating  reversal   Alternatives discussed:  Analgesia without sedation Universal protocol:    Immediately prior to procedure a time out was called: yes     Patient identity confirmation method:  Verbally with patient and provided demographic data Indications:    Procedure performed:  Cardioversion   Procedure necessitating sedation performed by:  Physician performing sedation   Intended level of sedation:  Deep Pre-sedation assessment:    Time since last food or drink:  4 hrs   ASA classification: class 3 - patient with severe systemic disease     Neck mobility: normal     Mallampati score:  III - soft palate, base of uvula visible   Pre-sedation assessments completed and reviewed: airway patency and cardiovascular function   Immediate pre-procedure details:    Verified: bag valve mask available   Procedure details (see MAR for exact dosages):    Preoxygenation:  Nasal cannula   Sedation:  Etomidate   Intra-procedure monitoring:  Blood pressure monitoring, cardiac monitor, frequent LOC assessments and frequent vital sign checks   Intra-procedure events: none     Total Provider sedation time (minutes):  5 Post-procedure details:    Post-sedation assessment completed:  08/25/2017 9:38 PM   Attendance: Constant attendance by certified staff until patient recovered     Recovery: Patient returned to pre-procedure baseline     Patient is stable for discharge or admission: yes     Patient tolerance:  Tolerated well, no immediate complications .Cardioversion  Date/Time: 08/25/2017 9:38 PM  Performed by: Davonna Belling, MD  Authorized by: Davonna Belling, MD    Consent:    Consent obtained:  Verbal   Consent given by:  Patient   Risks discussed:  Cutaneous burn, death and induced arrhythmia   Alternatives discussed:  No treatment, rate-control medication, alternative treatment and anti-coagulation medication Pre-procedure details:    Cardioversion basis:  Elective   Rhythm:  Atrial fibrillation   Electrode placement:  Anterior-posterior Patient sedated: Yes. Refer to sedation procedure documentation for details of sedation.  Attempt one:    Cardioversion mode:  Synchronous   Waveform:  Biphasic   Shock (Joules):  150   Shock outcome:  Conversion to normal sinus rhythm Post-procedure details:    Patient status:  Alert   Patient tolerance of procedure:  Tolerated well, no immediate complications   (including critical care time)  Medications Ordered in ED Medications  ondansetron (ZOFRAN) injection 4 mg (4 mg Intravenous Given 08/25/17 1652)  sodium chloride 0.9 % bolus 1,000 mL (0 mLs Intravenous Stopped 08/25/17 1805)  etomidate (AMIDATE) injection 5 mg (5 mg Intravenous Given 08/25/17 2002)  cefTRIAXone (ROCEPHIN) 1 g in sodium chloride 0.9 % 100 mL IVPB ( Intravenous Stopped 08/25/17 2100)  ondansetron (ZOFRAN) injection 4 mg (4 mg Intravenous Given 08/25/17 1948)     Initial Impression / Assessment and Plan / ED Course  I have reviewed the triage vital signs and the nursing notes.  Pertinent labs & imaging results that were available during my care of the patient were reviewed by me and considered in my medical decision making (see chart for details).     Patient with URI/bronchitis type symptoms.  Cough with some sputum production.  Also has nausea vomiting.  Both posttussive emesis and some nausea induced emesis also.  Benign abdominal exam.  However found to be in atrial fibrillation with RVR.  He has had this previously.  I think the likely onset of the A. fib was last night or today.  Checked  heart rate yesterday and was normal.   Cardioverted in the ER successfully.  However does have elevated white count and still feels fatigued.  With the cough and sputum production despite the negative x-ray will treat with antibiotics.  Will admit to hospital for further evaluation and treatment.    Final Clinical Impressions(s) / ED Diagnoses   Final diagnoses:  Atrial fibrillation with rapid ventricular response (HCC)  Bronchitis  Non-intractable vomiting with nausea, unspecified vomiting type    ED Discharge Orders    None      Davonna Belling, MD 08/25/17 2140

## 2017-08-26 ENCOUNTER — Inpatient Hospital Stay (HOSPITAL_COMMUNITY): Payer: BLUE CROSS/BLUE SHIELD

## 2017-08-26 ENCOUNTER — Other Ambulatory Visit: Payer: Self-pay

## 2017-08-26 DIAGNOSIS — I4891 Unspecified atrial fibrillation: Secondary | ICD-10-CM

## 2017-08-26 DIAGNOSIS — I10 Essential (primary) hypertension: Secondary | ICD-10-CM

## 2017-08-26 DIAGNOSIS — I739 Peripheral vascular disease, unspecified: Secondary | ICD-10-CM

## 2017-08-26 DIAGNOSIS — E119 Type 2 diabetes mellitus without complications: Secondary | ICD-10-CM

## 2017-08-26 DIAGNOSIS — N289 Disorder of kidney and ureter, unspecified: Secondary | ICD-10-CM

## 2017-08-26 DIAGNOSIS — E1051 Type 1 diabetes mellitus with diabetic peripheral angiopathy without gangrene: Secondary | ICD-10-CM

## 2017-08-26 DIAGNOSIS — I4892 Unspecified atrial flutter: Secondary | ICD-10-CM

## 2017-08-26 DIAGNOSIS — Z794 Long term (current) use of insulin: Secondary | ICD-10-CM

## 2017-08-26 DIAGNOSIS — J189 Pneumonia, unspecified organism: Principal | ICD-10-CM

## 2017-08-26 DIAGNOSIS — J410 Simple chronic bronchitis: Secondary | ICD-10-CM

## 2017-08-26 LAB — URINALYSIS, ROUTINE W REFLEX MICROSCOPIC
Bacteria, UA: NONE SEEN
Bilirubin Urine: NEGATIVE
HGB URINE DIPSTICK: NEGATIVE
Ketones, ur: NEGATIVE mg/dL
Leukocytes, UA: NEGATIVE
Nitrite: NEGATIVE
PH: 6 (ref 5.0–8.0)
Protein, ur: NEGATIVE mg/dL
Specific Gravity, Urine: 1.012 (ref 1.005–1.030)

## 2017-08-26 LAB — CBC WITH DIFFERENTIAL/PLATELET
ABS IMMATURE GRANULOCYTES: 0.1 10*3/uL (ref 0.0–0.1)
BASOS PCT: 0 %
Basophils Absolute: 0 10*3/uL (ref 0.0–0.1)
EOS ABS: 0.1 10*3/uL (ref 0.0–0.7)
Eosinophils Relative: 1 %
HEMATOCRIT: 41.1 % (ref 39.0–52.0)
Hemoglobin: 13.5 g/dL (ref 13.0–17.0)
IMMATURE GRANULOCYTES: 1 %
LYMPHS ABS: 0.7 10*3/uL (ref 0.7–4.0)
Lymphocytes Relative: 4 %
MCH: 32.9 pg (ref 26.0–34.0)
MCHC: 32.8 g/dL (ref 30.0–36.0)
MCV: 100.2 fL — ABNORMAL HIGH (ref 78.0–100.0)
Monocytes Absolute: 1.1 10*3/uL — ABNORMAL HIGH (ref 0.1–1.0)
Monocytes Relative: 7 %
NEUTROS ABS: 14.1 10*3/uL — AB (ref 1.7–7.7)
NEUTROS PCT: 87 %
Platelets: 207 10*3/uL (ref 150–400)
RBC: 4.1 MIL/uL — AB (ref 4.22–5.81)
RDW: 12.9 % (ref 11.5–15.5)
WBC: 16.1 10*3/uL — AB (ref 4.0–10.5)

## 2017-08-26 LAB — COMPREHENSIVE METABOLIC PANEL
ALT: 10 U/L — AB (ref 17–63)
ANION GAP: 9 (ref 5–15)
AST: 15 U/L (ref 15–41)
Albumin: 3.3 g/dL — ABNORMAL LOW (ref 3.5–5.0)
Alkaline Phosphatase: 48 U/L (ref 38–126)
BUN: 21 mg/dL — ABNORMAL HIGH (ref 6–20)
CO2: 23 mmol/L (ref 22–32)
CREATININE: 1.16 mg/dL (ref 0.61–1.24)
Calcium: 8.4 mg/dL — ABNORMAL LOW (ref 8.9–10.3)
Chloride: 103 mmol/L (ref 101–111)
Glucose, Bld: 221 mg/dL — ABNORMAL HIGH (ref 65–99)
Potassium: 4.3 mmol/L (ref 3.5–5.1)
Sodium: 135 mmol/L (ref 135–145)
Total Bilirubin: 1.3 mg/dL — ABNORMAL HIGH (ref 0.3–1.2)
Total Protein: 5.6 g/dL — ABNORMAL LOW (ref 6.5–8.1)

## 2017-08-26 LAB — BILIRUBIN, FRACTIONATED(TOT/DIR/INDIR)
Bilirubin, Direct: 0.2 mg/dL (ref 0.1–0.5)
Indirect Bilirubin: 1.2 mg/dL — ABNORMAL HIGH (ref 0.3–0.9)
Total Bilirubin: 1.4 mg/dL — ABNORMAL HIGH (ref 0.3–1.2)

## 2017-08-26 LAB — BASIC METABOLIC PANEL
ANION GAP: 10 (ref 5–15)
BUN: 21 mg/dL — ABNORMAL HIGH (ref 6–20)
CALCIUM: 8.8 mg/dL — AB (ref 8.9–10.3)
CO2: 24 mmol/L (ref 22–32)
Chloride: 105 mmol/L (ref 101–111)
Creatinine, Ser: 1.39 mg/dL — ABNORMAL HIGH (ref 0.61–1.24)
GFR, EST NON AFRICAN AMERICAN: 54 mL/min — AB (ref >60)
Glucose, Bld: 245 mg/dL — ABNORMAL HIGH (ref 65–99)
Potassium: 4.8 mmol/L (ref 3.5–5.1)
Sodium: 139 mmol/L (ref 135–145)

## 2017-08-26 LAB — ECHOCARDIOGRAM COMPLETE
HEIGHTINCHES: 70 in
WEIGHTICAEL: 2613.77 [oz_av]

## 2017-08-26 LAB — POTASSIUM
Potassium: 4.4 mmol/L (ref 3.5–5.1)
Potassium: 4.4 mmol/L (ref 3.5–5.1)
Potassium: 4.4 mmol/L (ref 3.5–5.1)

## 2017-08-26 LAB — PROCALCITONIN: Procalcitonin: 0.11 ng/mL

## 2017-08-26 LAB — GLUCOSE, CAPILLARY
GLUCOSE-CAPILLARY: 277 mg/dL — AB (ref 65–99)
GLUCOSE-CAPILLARY: 327 mg/dL — AB (ref 65–99)
Glucose-Capillary: 215 mg/dL — ABNORMAL HIGH (ref 65–99)

## 2017-08-26 LAB — CREATININE, URINE, RANDOM: CREATININE, URINE: 80.08 mg/dL

## 2017-08-26 LAB — EXPECTORATED SPUTUM ASSESSMENT W REFEX TO RESP CULTURE: SPECIAL REQUESTS: NORMAL

## 2017-08-26 LAB — HIV ANTIBODY (ROUTINE TESTING W REFLEX): HIV SCREEN 4TH GENERATION: NONREACTIVE

## 2017-08-26 LAB — SODIUM, URINE, RANDOM: SODIUM UR: 38 mmol/L

## 2017-08-26 MED ORDER — NICOTINE 21 MG/24HR TD PT24: 21.0000 mg | MEDICATED_PATCH | Freq: Every day | TRANSDERMAL | Status: DC

## 2017-08-26 MED ORDER — APIXABAN 5 MG PO TABS: 5.0000 mg | ORAL_TABLET | Freq: Two times a day (BID) | ORAL | 0 refills | Status: DC

## 2017-08-26 MED ORDER — APIXABAN 5 MG PO TABS: 5.0000 mg | ORAL_TABLET | Freq: Two times a day (BID) | ORAL | Status: DC

## 2017-08-26 MED ORDER — AMOXICILLIN-POT CLAVULANATE 875-125 MG PO TABS: 1.0000 | ORAL_TABLET | Freq: Two times a day (BID) | ORAL | 0 refills | Status: DC

## 2017-08-26 MED ORDER — IPRATROPIUM-ALBUTEROL 0.5-2.5 (3) MG/3ML IN SOLN: 3.0000 mL | Freq: Four times a day (QID) | RESPIRATORY_TRACT | Status: DC | PRN

## 2017-08-26 MED ORDER — INSULIN GLARGINE 100 UNIT/ML ~~LOC~~ SOLN: 42.0000 [IU] | Freq: Every day | SUBCUTANEOUS | Status: DC

## 2017-08-26 NOTE — Progress Notes (Signed)
PROGRESS NOTE    Thomas Sandoval.  ZOX:[id number] DOB: 1955-12-29 DOA: 08/25/2017 PCP: Chevis Pretty, FNP   Brief Narrative: Thomas Sandoval. is a 62 y.o. male with medical history significant for COPD, hypertension, insulin-dependent diabetes mellitus, and episode of atrial fibrillation in which she converted to NSR with diltiazem, now presenting to the emergency department for evaluation of shortness of breath, productive cough, nausea, and chills. On arrival to ED, he was found to be in atrial fib withrate in 150's. He was cardioverted in ED and has been in NSR since then. Cardiology consulted.    Assessment & Plan:   Principal Problem:   CAP (community acquired pneumonia) Active Problems:   COPD (chronic obstructive pulmonary disease) ([place])   DM type 2 (diabetes mellitus, type 2) (HCC)   Atrial fibrillation with rapid ventricular response (HCC)   Essential hypertension   Mild renal insufficiency   Hyperkalemia   Hyperbilirubinemia   CAP;  Admitted for IV antibiotics,. He is on RA with good oxygen sats but continues to cough.  Blood cultures and sputum cultures are pending.  Resume IV rocephin for another 24 hours.  Ambulate in the hallway and monitor oxygen sats.  afebrile and leukocytosis is improving.  Influenza pcr is negative.  Urine for strep and legionella antigen is pending.    AKI:  Improved with hydration.    COPD:  No wheezing heard. Resume duonebs as needed.    Diabetes mellitus: CBG (last 3)  Recent Labs    08/25/17 2324 08/26/17 0727 08/26/17 1130  GLUCAP 209* 215* 277*   Get HGBA1C.  Resume home lantus and SSI.    Paroxysmal atrial fibrillation:  Rate okay after cardioversion.  Cardiology consulted for recommendations. He has been on eliquis previously since 2016 till 2017 and stopped it.  He was put back on eliquis and echocardiogram ordered   Hypertension: well controlled.     DVT prophylaxis:  Code Status: full  code.  Family Communication: family at bedside.  Disposition Plan: pending cardiology eval and clinical improvement.   Consultants:   Cardiology for atrial flutter/ fib   Procedures: echocardiogram.   Antimicrobials: rocephin and doxycycline.   Subjective: Continues to cough,but wants to go home.   Objective: Vitals:   08/26/17 0317 08/26/17 0429 08/26/17 0900 08/26/17 0932  BP:  (!) 127/53 (!) 118/47   Pulse:  84 83   Resp:  16 (!) 24   Temp:  98.6 F (37 C) (!) 97.1 F (36.2 C) 98.1 F (36.7 C)  TempSrc:  Oral Oral Oral  SpO2:  97% 97%   Weight: 74.1 kg (163 lb 5.8 oz)     Height:        Intake/Output Summary (Last 24 hours) at 08/26/2017 1516 Last data filed at 08/26/2017 1422 Gross per 24 hour  Intake 726 ml  Output 300 ml  Net 426 ml   Filed Weights   08/25/17 2318 08/26/17 0317  Weight: 74.1 kg (163 lb 5.8 oz) 74.1 kg (163 lb 5.8 oz)    Examination:  General exam: Appears calm and comfortable  Respiratory system: Clear to auscultation. Respiratory effort normal. Scattered rhonchi.  Cardiovascular system: S1 & S2 heard, RRR. No JVD, murmurs, No pedal edema. Gastrointestinal system: Abdomen is nondistended, soft and nontender. No organomegaly or masses felt. Normal bowel sounds heard. Central nervous system: Alert and oriented. No focal neurological deficits. Extremities: Symmetric 5 x 5 power. Skin: No rashes, lesions or ulcers Psychiatry:  Mood & affect appropriate.  Data Reviewed: I have personally reviewed following labs and imaging studies  CBC: Recent Labs  Lab 08/25/17 1619 08/25/17 1631 08/26/17 0501  WBC 20.8*  --  16.1*  NEUTROABS 18.8*  --  14.1*  HGB 16.3 17.0 13.5  HCT 49.4 50.0 41.1  MCV 99.6  --  100.2*  PLT 252  --  572   Basic Metabolic Panel: Recent Labs  Lab 08/25/17 1619 08/25/17 1631 08/26/17 0001 08/26/17 0501 08/26/17 0653 08/26/17 0947  NA 137 136 139 135  --   --   K 5.7* 4.7 4.8 4.3 4.4 4.4  CL 104 104  105 103  --   --   CO2 22  --  24 23  --   --   GLUCOSE 163* 159* 245* 221*  --   --   BUN 14 18 21* 21*  --   --   CREATININE 1.37* 1.20 1.39* 1.16  --   --   CALCIUM 9.6  --  8.8* 8.4*  --   --    GFR: Estimated Creatinine Clearance: 70.8 mL/min (by C-G formula based on SCr of 1.16 mg/dL). Liver Function Tests: Recent Labs  Lab 08/25/17 1619 08/26/17 0501 08/26/17 0653  AST 26 15  --   ALT 8* 10*  --   ALKPHOS 56 48  --   BILITOT 2.8* 1.3* 1.4*  PROT 6.8 5.6*  --   ALBUMIN 4.0 3.3*  --    No results for input(s): LIPASE, AMYLASE in the last 168 hours. No results for input(s): AMMONIA in the last 168 hours. Coagulation Profile: No results for input(s): INR, PROTIME in the last 168 hours. Cardiac Enzymes: No results for input(s): CKTOTAL, CKMB, CKMBINDEX, TROPONINI in the last 168 hours. BNP (last 3 results) No results for input(s): PROBNP in the last 8760 hours. HbA1C: No results for input(s): HGBA1C in the last 72 hours. CBG: Recent Labs  Lab 08/25/17 1601 08/25/17 2324 08/26/17 0727 08/26/17 1130  GLUCAP 160* 209* 215* 277*   Lipid Profile: No results for input(s): CHOL, HDL, LDLCALC, TRIG, CHOLHDL, LDLDIRECT in the last 72 hours. Thyroid Function Tests: No results for input(s): TSH, T4TOTAL, FREET4, T3FREE, THYROIDAB in the last 72 hours. Anemia Panel: No results for input(s): VITAMINB12, FOLATE, FERRITIN, TIBC, IRON, RETICCTPCT in the last 72 hours. Sepsis Labs: Recent Labs  Lab 08/25/17 1629 08/25/17 1803 08/26/17 0001 08/26/17 0653  PROCALCITON  --   --  <0.10 0.11  LATICACIDVEN 1.44 0.92  --   --     Recent Results (from the past 240 hour(s))  Culture, blood (routine x 2)     Status: None (Preliminary result)   Collection Time: 08/25/17  7:01 PM  Result Value Ref Range Status   Specimen Description BLOOD RIGHT ANTECUBITAL  Final   Special Requests   Final    BOTTLES DRAWN AEROBIC AND ANAEROBIC Blood Culture adequate volume   Culture   Final     NO GROWTH < 24 HOURS Performed at [facility], [address], [place],  [address]    Report Status PENDING  Incomplete  Culture, blood (routine x 2)     Status: None (Preliminary result)   Collection Time: 08/25/17  9:20 PM  Result Value Ref Range Status   Specimen Description BLOOD RIGHT FOREARM  Final   Special Requests   Final    BOTTLES DRAWN AEROBIC AND ANAEROBIC Blood Culture adequate volume   Culture   Final    NO GROWTH <  24 HOURS Performed at [facility], [place] [address] [place], [place] [address]    Report Status PENDING  Incomplete  Culture, sputum-assessment     Status: None   Collection Time: 08/26/17 10:16 AM  Result Value Ref Range Status   Specimen Description SPUTUM  Final   Special Requests Normal  Final   Sputum evaluation   Final    THIS SPECIMEN IS ACCEPTABLE FOR SPUTUM CULTURE Performed at [facility], [address] [place], [place] [address]    Report Status 08/26/2017 FINAL  Final  Culture, respiratory (NON-Expectorated)     Status: None (Preliminary result)   Collection Time: 08/26/17 10:16 AM  Result Value Ref Range Status   Specimen Description SPUTUM  Final   Special Requests Normal Reflexed from [id number]  Final   Gram Stain   Final    ABUNDANT WBC PRESENT, PREDOMINANTLY PMN MODERATE GRAM POSITIVE COCCI MODERATE GRAM NEGATIVE RODS FEW GRAM POSITIVE RODS Performed at [facility], [address] [place], [place] [address]    Culture PENDING  Incomplete   Report Status PENDING  Incomplete         Radiology Studies: Dg Chest 2 View  Result Date: 08/25/2017 CLINICAL DATA:  Cough.  Vomiting.  Diaphoresis. EXAM: CHEST - 2 VIEW COMPARISON:  None. FINDINGS: The heart size and mediastinal contours are within normal limits. Both lungs are clear. The visualized skeletal structures are unremarkable. IMPRESSION: Negative two view chest x-ray Electronically Signed   By: San Morelle M.D.   On: 08/25/2017 14:27          Scheduled Meds: . apixaban  5 mg Oral BID  . aspirin  81 mg Oral Daily  . doxycycline  100 mg Oral Q12H  . famotidine  20 mg Oral q morning - 10a  . insulin aspart  0-5 Units Subcutaneous QHS  . insulin aspart  0-9 Units Subcutaneous TID WC  . [START ON 08/27/2017] insulin glargine  42 Units Subcutaneous Daily  . mometasone-formoterol  2 puff Inhalation BID  . sodium chloride flush  3 mL Intravenous Q12H  . sodium chloride flush  3 mL Intravenous Q12H   Continuous Infusions: . sodium chloride    . cefTRIAXone (ROCEPHIN)  IV       LOS: 1 day    Time spent: 35 minutes.     Hosie Poisson, MD [facility] Pager [phone]  If 7PM-7AM, please contact night-coverage [email or web address] Password [facility] 08/26/2017, 3:16 PM

## 2017-08-26 NOTE — Progress Notes (Signed)
Patient discharged to Home with wife pateint left knowing he has one more dose of antibiotic that he did not received. After visit Summary reviewed. Patient capable of reverbalizing medications and follow up visits. No signs and symptoms of distress noted. Patient educated to return to the ED in the case of an emergency. Dillon Bjork RN

## 2017-08-26 NOTE — CV Procedure (Signed)
Attempted 2D Echo, patient is not in room, will try again at a later time.  Thomas Sandoval

## 2017-08-26 NOTE — Progress Notes (Signed)
ANTICOAGULATION CONSULT NOTE - Initial Consult  Pharmacy Consult for Eliquis Indication: atrial fibrillation  Allergies  Allergen Reactions  . Azithromycin Other (See Comments) and Nausea And Vomiting    "Severe stomach cramps; told to list as an allergy by dr. Huel Cote ago"  . Avelox [Moxifloxacin Hcl In Nacl] Other (See Comments)    Hemolysis  In 2012  . Bactrim [Sulfamethoxazole-Trimethoprim] Diarrhea and Nausea And Vomiting    Patient Measurements: Height: 5\' 10"  (177.8 cm) Weight: 163 lb 5.8 oz (74.1 kg) IBW/kg (Calculated) : 73  Vital Signs: Temp: 98.1 F (36.7 C) (06/20 0932) Temp Source: Oral (06/20 0932) BP: 118/47 (06/20 0900) Pulse Rate: 83 (06/20 0900)  Labs: Recent Labs    08/25/17 1619 08/25/17 1631 08/26/17 0001 08/26/17 0501  HGB 16.3 17.0  --  13.5  HCT 49.4 50.0  --  41.1  PLT 252  --   --  207  CREATININE 1.37* 1.20 1.39* 1.16    Estimated Creatinine Clearance: 70.8 mL/min (by C-G formula based on SCr of 1.16 mg/dL).   Assessment: 11 YOM with history of AFib- has converted in the past, not on anticoagulation PTA. Presented to the ED in AFib with RVR and was cardioverted. Now to start Eliquis. CBC is wnl, SCr 1.16- appears to be baseline. Does not meet parameters for dose reduction.  Goal of Therapy:  correct dosing based on drug-specific parameters Monitor platelets by anticoagulation protocol: Yes   Plan:  Stop Lovenox prophylaxis Eliquis 5mg  PO BID Follow CBC and s/s bleeding  Cashtyn Pouliot D. Gertha Lichtenberg, PharmD, BCPS Clinical Pharmacist [phone] Please check AMION for all [place] numbers 08/26/2017 10:50 AM

## 2017-08-26 NOTE — Consult Note (Addendum)
.    Cardiology Consultation:   Patient ID: Thomas Hert.; [id number]; 1955/04/25   Admit date: 08/25/2017 Date of Consult: 08/26/2017  Primary Care Provider: Chevis Pretty, FNP Primary Cardiologist: Dr. Kara Pacer (2012)  Patient Profile:   Thomas Sandoval. is a 62 y.o. male with a hx of hypertension, DM2, COPD, GERD, PAD (by dopplers, bilateral fem-pop occlusion -treated medically for now by VVS) and prior episodes of atrial fibrillation in which he converted to NSR with diltiazem who is being seen today for the evaluation of atrial fibrillation with rapid ventricular response at the request of Dr. Myna Hidalgo.  History of Present Illness:   [name] is a 62 yo M with a hx as stated above who presented to [facility] on 08/25/17 with SOB, productive cough, nausea and chills. Pt states that his symptoms initially began several days ago however, became acutely worse on the morning of admission. He denies chest pain, palpitations, LE swelling, orthopnea, dizziness or syncope. He states that he has had ongoing nausea and one episode of vomiting. He reports that he gets recurrent bronchitis several times a year and has been known to go into AF on occassion. He is a current everyday smoker. He has no prior CAD hx. Hem has a hx of mitral valve prolapse.   In the ED, pt was found to be tachycardic in the 130's with stable BP. He was febrile with a temp of 37.7 C. BMET showed a K+ of 5.7 and an elevated creatinine of 1.37, up from a baseline of 0.89. CXR revealed clear lung fields with no acute cardiopulmonary changes. When an EKG was performed, he was found to be in atrial fibrillation with rapid ventricular response with a rate of 151bpm. He was cardioverted in the ED successfully to NSR. He was treated with 1L NS and started on abx and admitted for PNA and suspected AKI.   Of note, he was last seen in the office approximately 3 years ago (2016) as hospital follow up for AF. Per chart  review, he was initially found to have an episode of AF in 2012. He was then seen by our service in 2016 for AF in the setting of perforated pre-pyloric ulcer that was repaired laparoscopically in which he converted spontaneously to NSR. Recommendations were made to start Eliquis. His metoprolol was decreased in the setting of SB (HR 50's). During his office visit, he had c/o exertional dyspnea, however it was unchanged. He stopped his metoprolol on his own bc it made him feel "bad" and did not start Eliquis because he was not given a prescription (plans were for 30-day free initially). He was prescribed Eliquis 5mg  and diltiazem CD 120mg  which he did not take.   In talking with his wife today, it appears that this has been an ongoing issue with acute illness>atrial fibrillation>started on Eliquis and pt is non-compliant. He understands the increased risk of stroke. He states that he is taking Latus and Spiriva, both of which are extremely expensive and cannot afford more medications at this time.   Cardiology has been consulted to help facilitate medication management for him.    Past Medical History:  Diagnosis Date  . Arthritis   . Bladder cancer ([place])   . Chronic back pain   . COPD (chronic obstructive pulmonary disease) ([place])   . DDD (degenerative disc disease)   . Diabetic retinopathy of both eyes ([place])   . Essential hypertension   . GERD (gastroesophageal reflux disease)   .  History of atrial flutter 02/2011   Converted to NSR with Cardizem  . History of chronic bronchitis   . History of hemolytic anemia 02/2011   secondary to Avelox  . HOH (hard of hearing)   . HOH (hard of hearing)    no eardrum and nerve damage on R, also HOH on L  . Mitral valve prolapse    a. 2D Echo 11/27/14: EF 55-60%; images were inadequate for LV wall motion assessment, + mild late systolic mitral valve prolapse involving the anterior leaflet.  Marland Kitchen PAD (peripheral artery disease) ([place]) 04/2014   Dr Trula Slade;  bilateral SFA occlusion, R mid, L distal  . PAF (paroxysmal atrial fibrillation) ([place])    a. Dx 11/2014 during admission for perf ulcer.  . Perforated ulcer ([place])    a. 11/2014 s/p surgery.  . Productive cough   . Smokers' cough ([place])   . Type 1 diabetes mellitus ([place]) 1977    Past Surgical History:  Procedure Laterality Date  . CYSTOSCOPY WITH URETEROSCOPY Right 08/14/2013   Procedure: CYSTOSCOPY WITH URETEROSCOPY BLADDER BIOPSY ;  Surgeon: Claybon Jabs, MD;  Location[facility];  Service: Urology;  Laterality: Right;  . ESOPHAGOGASTRODUODENOSCOPY N/A 02/06/2013   Procedure: ESOPHAGOGASTRODUODENOSCOPY (EGD);  Surgeon: Lafayette Dragon, MD;  Location: [facility] ENDOSCOPY;  Service: Endoscopy;  Laterality: N/A;  . LAPAROSCOPY N/A 11/25/2014   Procedure: LAPAROSCOPIC PRIMARY REPAIR OF PERFORATED PREPYLORIC ULCER WITH Silvestre Gunner;  Surgeon: Greer Pickerel, MD;  Location: [facility];  Service: General;  Laterality: N/A;  . TONSILLECTOMY  as child  . TRANSTHORACIC ECHOCARDIOGRAM  02-17-2011   MODERATE LVH/  EF 65%  . TRANSURETHRAL RESECTION OF BLADDER TUMOR WITH GYRUS (TURBT-GYRUS) N/A 06/12/2013   Procedure: TRANSURETHRAL RESECTION OF BLADDER TUMOR WITH GYRUS (TURBT-GYRUS);  Surgeon: Claybon Jabs, MD;  Location: [facility];  Service: Urology;  Laterality: N/A;  . TYMPANIC MEMBRANE REPAIR  as child     Prior to Admission medications   Medication Sig Start Date End Date Taking? Authorizing Provider  albuterol (PROVENTIL HFA;VENTOLIN HFA) 108 (90 Base) MCG/ACT inhaler Inhale 2 puffs every 6 (six) hours as needed into the lungs for wheezing or shortness of breath. 05/17/17  Yes Hassell Done, [name], FNP  aspirin 81 MG tablet Take 81 mg by mouth daily.   Yes [provider]  famotidine (PEPCID) 20 MG tablet Take 1 tablet (20 mg total) by mouth every morning. Reported on 04/16/2015 04/16/15  Yes Hassell Done, [name], FNP  Fluticasone-Salmeterol (ADVAIR DISKUS) 250-50 MCG/DOSE  AEPB INHALE 1 PUFF INTO THE LUNGS EVERY EVENING. 02/16/17  Yes Hassell Done, [name], FNP  insulin aspart (NOVOLOG) 100 UNIT/ML injection PER SLIDING SCALE: 190 - 200 = 2 units 300 and above = 7 units Patient taking differently: Inject 2-7 Units into the skin 3 (three) times daily with meals. PER SLIDING SCALE: 190 - 200 = 2 units 300 and above = 7 units 03/16/16  Yes Hawks, Ludden A, FNP  insulin glargine (LANTUS) 100 UNIT/ML injection INJECT 40 UNITS SUBCUTANEOUSLY EVERY MORNING 06/29/17  Yes Hassell Done, [name], FNP  lisinopril (PRINIVIL,ZESTRIL) 5 MG tablet TAKE 1 TABLET BY MOUTH EVERY DAY 06/28/17  Yes Hassell Done, [name], FNP  loratadine (CLARITIN) 10 MG tablet Take 10 mg by mouth at bedtime.     Yes [provider]  mometasone-formoterol (DULERA) 100-5 MCG/ACT AERO Inhale 2 puffs 2 (two) times daily into the lungs. 01/23/17  Yes Martin, [name], FNP  TRELEGY ELLIPTA 100-62.5-25 MCG/INH AEPB TAKE 1 PUFF BY MOUTH EVERY  DAY 06/14/17  Yes Hassell Done [name], FNP    Inpatient Medications: Scheduled Meds: . apixaban  5 mg Oral BID  . aspirin  81 mg Oral Daily  . doxycycline  100 mg Oral Q12H  . famotidine  20 mg Oral q morning - 10a  . insulin aspart  0-5 Units Subcutaneous QHS  . insulin aspart  0-9 Units Subcutaneous TID WC  . insulin glargine  20 Units Subcutaneous Daily  . mometasone-formoterol  2 puff Inhalation BID  . sodium chloride flush  3 mL Intravenous Q12H  . sodium chloride flush  3 mL Intravenous Q12H   Continuous Infusions: . sodium chloride    . cefTRIAXone (ROCEPHIN)  IV     PRN Meds: sodium chloride, acetaminophen **OR** acetaminophen, albuterol, bisacodyl, HYDROcodone-acetaminophen, ondansetron **OR** ondansetron (ZOFRAN) IV, phenol, senna-docusate, sodium chloride flush  Allergies:    Allergies  Allergen Reactions  . Azithromycin Other (See Comments) and Nausea And Vomiting    "Severe stomach cramps; told to list as an allergy by dr. Huel Cote ago"    . Avelox [Moxifloxacin Hcl In Nacl] Other (See Comments)    Hemolysis  In 2012  . Bactrim [Sulfamethoxazole-Trimethoprim] Diarrhea and Nausea And Vomiting    Social History:   Social History   Socioeconomic History  . Marital status: Married    Spouse name: Not on file  . Number of children: Not on file  . Years of education: Not on file  . Highest education level: Not on file  Occupational History  . Occupation: [employer]  . Financial resource strain: Not on file  . Food insecurity:    Worry: Not on file    Inability: Not on file  . Transportation needs:    Medical: Not on file    Non-medical: Not on file  Tobacco Use  . Smoking status: Current Every Day Smoker    Packs/day: 2.00    Years: 30.00    Pack years: 60.00    Types: Cigarettes  . Smokeless tobacco: Former Systems developer    Quit date: 06/08/1978  Substance and Sexual Activity  . Alcohol use: No    Alcohol/week: 0.0 oz  . Drug use: No  . Sexual activity: Not on file  Lifestyle  . Physical activity:    Days per week: Not on file    Minutes per session: Not on file  . Stress: Not on file  Relationships  . Social connections:    Talks on phone: Not on file    Gets together: Not on file    Attends religious service: Not on file    Active member of club or organization: Not on file    Attends meetings of clubs or organizations: Not on file    Relationship status: Not on file  . Intimate partner violence:    Fear of current or ex partner: Not on file    Emotionally abused: Not on file    Physically abused: Not on file    Forced sexual activity: Not on file  Other Topics Concern  . Not on file  Social History Narrative   Lives in [place] with wife and 2 sons.     Family History:   Family History  Problem Relation Age of Onset  . Breast cancer Mother   . Cancer Mother        Breast  . Rheumatic fever Father   . Heart disease Father   . Heart attack Father  Massive   . Diabetes Son     Family Status:  Family Status  Relation Name Status  . Mother  Deceased at age 63  . Father  Deceased at age 75  . Son  Alive    ROS:  Please see the history of present illness.  All other ROS reviewed and negative.     Physical Exam/Data:   Vitals:   08/26/17 0317 08/26/17 0429 08/26/17 0900 08/26/17 0932  BP:  (!) 127/53 (!) 118/47   Pulse:  84 83   Resp:  16 (!) 24   Temp:  98.6 F (37 C) (!) 97.1 F (36.2 C) 98.1 F (36.7 C)  TempSrc:  Oral Oral Oral  SpO2:  97% 97%   Weight: 163 lb 5.8 oz (74.1 kg)     Height:        Intake/Output Summary (Last 24 hours) at 08/26/2017 1147 Last data filed at 08/26/2017 0900 Gross per 24 hour  Intake 426 ml  Output 300 ml  Net 126 ml   Filed Weights   08/25/17 2318 08/26/17 0317  Weight: 163 lb 5.8 oz (74.1 kg) 163 lb 5.8 oz (74.1 kg)   Body mass index is 23.44 kg/m.   General: Well developed, well nourished, NAD Skin: Warm, dry, intact  Head: Normocephalic, atraumatic, clear, moist mucus membranes. Neck: Negative for carotid bruits. No JVD Lungs: Bilateral rhonchi with expiratory wheezes. Breathing is unlabored. Cardiovascular: RRR with S1 S2. No murmurs, rubs or gallops Abdomen: Soft, non-tender, non-distended with normoactive bowel sounds. No obvious abdominal masses. MSK: Strength and tone appear normal for age. 5/5 in all extremities Extremities: No edema. No clubbing or cyanosis. DP/PT pulses 2+ bilaterally Neuro: Alert and oriented. No focal deficits. No facial asymmetry. MAE spontaneously. Psych: Responds to questions appropriately with normal affect.     EKG:  The EKG was personally reviewed and demonstrates: 08/25/17 Atrial fibrillation HR 151 Telemetry:  Telemetry was personally reviewed and demonstrates: 08/26/17 NSR HR 70  Relevant CV Studies:  ECHO: 11/27/2014: Study Conclusions  - Left ventricle: The cavity size was normal. Systolic function was   normal. The estimated ejection fraction was in the  range of 55%   to 60%. Images were inadequate for LV wall motion assessment. - Mitral valve: Mild, late systolicprolapse, involving the anterior   leaflet.  CATH: None   Laboratory Data:  Chemistry Recent Labs  Lab 08/25/17 1619 08/25/17 1631 08/26/17 0001 08/26/17 0501 08/26/17 0653 08/26/17 0947  NA 137 136 139 135  --   --   K 5.7* 4.7 4.8 4.3 4.4 4.4  CL 104 104 105 103  --   --   CO2 22  --  24 23  --   --   GLUCOSE 163* 159* 245* 221*  --   --   BUN 14 18 21* 21*  --   --   CREATININE 1.37* 1.20 1.39* 1.16  --   --   CALCIUM 9.6  --  8.8* 8.4*  --   --   GFRNONAA 55*  --  54* >60  --   --   GFRAA >60  --  >60 >60  --   --   ANIONGAP 11  --  10 9  --   --     Total Protein  Date Value Ref Range Status  08/26/2017 5.6 (L) 6.5 - 8.1 g/dL Final  04/16/2015 6.4 6.0 - 8.5 g/dL Final   Albumin  Date Value Ref Range Status  08/26/2017 3.3 (L) 3.5 - 5.0 g/dL Final  04/16/2015 4.4 3.5 - 5.5 g/dL Final   AST  Date Value Ref Range Status  08/26/2017 15 15 - 41 U/L Final   ALT  Date Value Ref Range Status  08/26/2017 10 (L) 17 - 63 U/L Final   Alkaline Phosphatase  Date Value Ref Range Status  08/26/2017 48 38 - 126 U/L Final   Total Bilirubin  Date Value Ref Range Status  08/26/2017 1.4 (H) 0.3 - 1.2 mg/dL Final   Bilirubin Total  Date Value Ref Range Status  04/16/2015 0.9 0.0 - 1.2 mg/dL Final   Hematology Recent Labs  Lab 08/25/17 1619 08/25/17 1631 08/26/17 0501  WBC 20.8*  --  16.1*  RBC 4.96  --  4.10*  HGB 16.3 17.0 13.5  HCT 49.4 50.0 41.1  MCV 99.6  --  100.2*  MCH 32.9  --  32.9  MCHC 33.0  --  32.8  RDW 12.5  --  12.9  PLT 252  --  207   Cardiac EnzymesNo results for input(s): TROPONINI in the last 168 hours.  Recent Labs  Lab 08/25/17 1629  TROPIPOC 0.02    BNPNo results for input(s): BNP, PROBNP in the last 168 hours.  DDimer No results for input(s): DDIMER in the last 168 hours. TSH:  Lab Results  Component Value Date    TSH 3.148 11/27/2014   Lipids: Lab Results  Component Value Date   CHOL 85 (L) 04/16/2015   HDL 31 (L) 04/16/2015   LDLCALC 37 04/16/2015   TRIG 85 04/16/2015   CHOLHDL 2.7 04/16/2015   HgbA1c: Lab Results  Component Value Date   HGBA1C 6.3 04/16/2015    Radiology/Studies:  Dg Chest 2 View  Result Date: 08/25/2017 CLINICAL DATA:  Cough.  Vomiting.  Diaphoresis. EXAM: CHEST - 2 VIEW COMPARISON:  None. FINDINGS: The heart size and mediastinal contours are within normal limits. Both lungs are clear. The visualized skeletal structures are unremarkable. IMPRESSION: Negative two view chest x-ray Electronically Signed   By: San Morelle M.D.   On: 08/25/2017 14:27   Assessment and Plan:   1. Paroxysmal atrial fibrillation: -Pt has a remote hx of AF/flutter in the setting of perforated pre-pyloric ulcer that was repaired laparoscopically in which he spontaneously converted to NSR with diltiazem.  -Pt presented to [facility] for the treatment and management of PNA and was found to be in AF RVR in the ED. He was cardioverted to NSR and remained in SR since that time, HR 70's  -Started on Eliquis 5mg >>unlikely that he will continue this as OP secondary to hx of non-compliance with oral anticoagulation as well as beta blocker   -EKG 08/25/17 atrial fibrillation with RVR, HR 151 -Will continue Eliquis for now. HR is controlled without AV blocking agents. Will monitor for now to assess need  -CHADSVASC = 3 (HTN, vascular disease, DM)  2. CAP: -Pt presented with clinical signs of CAP (productive cough, fever, chills, leukocytosis) -BC's pennding -Treatment per primary team -Likely the underlying etiology of AF with RVR at presentation   2. HTN: -Stable, 118/47>127/53>150/73 -Not currently on antihypertensive medications -Home lisinopril on hold secondary to elevated renal fuction   4. Mitral valve prolapse: -Per echocardiogram 2016 with mild, late systolic prolapse, involving the  anterior leaflet. -Echo this admission with pending results for comparison   5. AKI: -Creatinine, 1.37 today -Baseline appears to be in the  -Pt given 1L NS in the ED for possible sepsis -  Will trend and monitor   6. DM2: -HbA1c, 6.3 -SSI for glucose control while inpatient  -Home lantus and novolog  7. COPD: -Per primary team -Continue nebs, abx   For questions or updates, please contact [facility] Please consult [email or web address] for contact info under Cardiology/STEMI.   SignedMaguire, Sime NP-C [facility] Pager: [phone] 08/26/2017 11:47 AM   I have seen and examined the patient along with Kathyrn Drown NP-C.  I have reviewed the chart, notes and new data.  I agree with PA/NP's note.  Key new complaints: Currently does not have any active cardiovascular complaints.  He has not had any recurrence of palpitations since hospitalization.  He is feeling better every day, but has a very sore throat from nausea and vomiting. Key examination changes: Prominent bilateral wheezing and diffusely diminished breath sounds consistent with advanced emphysema.  No rales are heard.  He has a systolic click but no murmur even with provocative maneuvers. Key new findings / data: Echocardiogram shows normal left ventricular size and systolic and diastolic left ventricular function.  He has prominent myxomatous changes of the mitral valve with anterior leaflet prolapse but without any regurgitation.  His left atrium is not dilated.  PLAN: Mr. Marlana Salvage has infrequent episodes of atrial fibrillation, associated with COPD and acute pneumonia.  Risk factors for embolic stroke include established vascular disease and diabetes mellitus, but he does not have heart failure, hypertension or history of previous stroke/TIA.  Lisinopril has been prescribed for renal protection with diabetes, not for hypertension.  CHADSVasc 2. He is aware of the increased risk of stroke with atrial fibrillation.  He  understands that aspirin does not provide sufficient protection and that he should be taking an oral anticoagulant.  Unfortunately, his insurance coverage is quite poor and he has a $2000 a year on drug expenses, which he meets very quickly due to the need for insulin.  I suspect we would be able to access some type of patient assistance to cover his medications.  Another option would be to start warfarin with periodic prothrombin time monitoring.   However, he is physically very active and is worried about bleeding.  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  Even after starting aspirin, he has noticed substantially increased bleeding risk.  Nevertheless my advice is to start Eliquis or similar oral anticoagulant.  He is reluctant. I do not think he requires antiarrhythmic therapy at this point.  The episodes of atrial fibrillation are very infrequent and have consistently occurred during the acute medical illness.  He has tried taking beta-blockers in the past and felt poorly with them.  This might be because of his obstructive lung disease. We discussed smoking cessation and a lot of detail.  His adult son and his wife that live with him also smoke.  He has tried quitting cigarettes in the past and has only been successful for very short periods of time.  He develops extreme anxiety if he cannot smoke.  He understands that smoking is causing damage to his lungs and is also bubbly large part responsible for his vascular problems, especially when superimposed on insulin requiring diabetes. He does have clear evidence of mitral valve prolapse by physical exam and by echo, but does not have any mitral insufficiency and does not require any particular monitoring or treatment for this.  There is a roughly 10% chance that he might develop serious complications such as chordal rupture and severe MR, usually after the age of 70.  He does not require endocarditis prophylaxis.  In summary I  recommended starting an oral anticoagulant (instead of ASA) and smoking cessation.  He is thinking about his options.   Sanda Klein, MD, [place] [phone] 08/26/2017, 4:58 PM

## 2017-08-26 NOTE — Progress Notes (Signed)
  Echocardiogram 2D Echocardiogram has been performed.  Thomas Sandoval T Thomas Sandoval 08/26/2017, 12:59 PM

## 2017-08-26 NOTE — Progress Notes (Signed)
New Admission Note:  Arrival Method: Via stretcher from ED Mental Orientation: Alert & Oriented x4 Telemetry: CCMD verified Assessment: Completed Skin: Refer to flowsheet IV: Right Forearm & Right AC Pain: 6/10 Tubes: Safety Measures: Safety Fall Prevention Plan discussed with patient. Admission: Completed [facility] [place] Orientation: Patient has been orientated to the room, unit and the staff. Family: Wife at the bedside.   Orders have been reviewed and are being implemented. Will continue to monitor the patient. Call light has been placed within reach.   Vassie Moselle, RN  Phone Number: [phone]

## 2017-08-27 NOTE — Discharge Summary (Signed)
Physician Discharge Summary  Thomas Sandoval. AGT:[id number] DOB: 1955/07/07 DOA: 08/25/2017  PCP: Chevis Pretty, FNP  Admit date: 08/25/2017 Discharge date: 08/26/2017  Admitted From: Home.  Disposition: Home.   Recommendations for Outpatient Follow-up:  1. Follow up with PCP in 1-2 weeks 2. Please obtain BMP/CBC in one week 3. Please follow up with cardiology in 2 to 4 weeks.     Discharge Condition: guarded.  CODE STATUS: full code.  Diet recommendation: Heart Healthy  Brief/Interim Summary: Thomas Sandovalis a 62 y.o.malewith medical history significant forCOPD, hypertension, insulin-dependent diabetes mellitus, and episode of atrial fibrillation in which she converted to NSR with diltiazem, now presenting to the emergency department for evaluation of shortness of breath, productive cough, nausea, and chills. On arrival to ED, Thomas Sandoval was found to be in atrial fib withrate in 150's. Thomas Sandoval was cardioverted in ED and has been in NSR since then. Cardiology consulted, recommended started on eliquis.     Discharge Diagnoses:  Principal Problem:   CAP (community acquired pneumonia) Active Problems:   COPD (chronic obstructive pulmonary disease) ([place])   DM type 2 (diabetes mellitus, type 2) (HCC)   Atrial fibrillation with rapid ventricular response (HCC)   Essential hypertension   Mild renal insufficiency   Hyperkalemia   Hyperbilirubinemia  CAP;  Admitted for IV antibiotics,. Thomas Sandoval is on RA with good oxygen sats but continues to cough.  Blood cultures and sputum cultures are pending and have been negative so far.  afebrile and leukocytosis is improving.  Influenza pcr is negative.  Urine for strep and legionella antigen is pending.  Patient at the end of the day, requested to go out and smoke vs Thomas Sandoval wanted to leave AMA . Thomas Sandoval was discharged on augmentin to complete the course.    AKI:  Improved with hydration.    COPD:  No wheezing heard. Resume duonebs as  needed.    Diabetes mellitus: Resume home dose of lantus and SSI.    Paroxysmal atrial fibrillation:  Rate okay after cardioversion.  Cardiology consulted for recommendations. Thomas Sandoval has been on eliquis previously since 2016 till 2017 and stopped it.  Thomas Sandoval was put back on eliquis and echocardiogram ordered . Reviewed.  Encouraged him to be on anti coagulation for now.   Hypertension: well controlled     Discharge Instructions  Discharge Instructions    Diet - low sodium heart healthy   Complete by:  As directed    Discharge instructions   Complete by:  As directed    Please follow up with CARDIOLOGY in 2 to 4 weeks.  Please follow up with PCP in one week and get a CXR.  Please follow up with PCP and check CBC and BMP.     Allergies as of 08/26/2017      Reactions   Azithromycin Other (See Comments), Nausea And Vomiting   "Severe stomach cramps; told to list as an allergy by dr. Huel Cote ago"   Avelox [moxifloxacin Hcl In Nacl] Other (See Comments)   Hemolysis  In 2012   Bactrim [sulfamethoxazole-trimethoprim] Diarrhea, Nausea And Vomiting      Medication List    STOP taking these medications   mometasone-formoterol 100-5 MCG/ACT Aero Commonly known as:  DULERA     TAKE these medications   albuterol 108 (90 Base) MCG/ACT inhaler Commonly known as:  PROVENTIL HFA;VENTOLIN HFA Inhale 2 puffs every 6 (six) hours as needed into the lungs for wheezing or shortness of breath.   amoxicillin-clavulanate 875-125 MG  tablet Commonly known as:  AUGMENTIN Take 1 tablet by mouth every 12 (twelve) hours for 7 days.   apixaban 5 MG Tabs tablet Commonly known as:  ELIQUIS Take 1 tablet (5 mg total) by mouth 2 (two) times daily.   aspirin 81 MG tablet Take 81 mg by mouth daily.   famotidine 20 MG tablet Commonly known as:  PEPCID Take 1 tablet (20 mg total) by mouth every morning. Reported on 04/16/2015   Fluticasone-Salmeterol 250-50 MCG/DOSE Aepb Commonly known as:  ADVAIR  DISKUS INHALE 1 PUFF INTO THE LUNGS EVERY EVENING.   insulin aspart 100 UNIT/ML injection Commonly known as:  novoLOG PER SLIDING SCALE: 190 - 200 = 2 units 300 and above = 7 units What changed:    how much to take  how to take this  when to take this  additional instructions   insulin glargine 100 UNIT/ML injection Commonly known as:  LANTUS INJECT 40 UNITS SUBCUTANEOUSLY EVERY MORNING   lisinopril 5 MG tablet Commonly known as:  PRINIVIL,ZESTRIL TAKE 1 TABLET BY MOUTH EVERY DAY   loratadine 10 MG tablet Commonly known as:  CLARITIN Take 10 mg by mouth at bedtime.   TRELEGY ELLIPTA 100-62.5-25 MCG/INH Aepb Generic drug:  Fluticasone-Umeclidin-Vilant TAKE 1 PUFF BY MOUTH EVERY DAY       Allergies  Allergen Reactions  . Azithromycin Other (See Comments) and Nausea And Vomiting    "Severe stomach cramps; told to list as an allergy by dr. Huel Cote ago"  . Avelox [Moxifloxacin Hcl In Nacl] Other (See Comments)    Hemolysis  In 2012  . Bactrim [Sulfamethoxazole-Trimethoprim] Diarrhea and Nausea And Vomiting    Consultations:  Cardiology.    Procedures/Studies: Dg Chest 2 View  Result Date: 08/25/2017 CLINICAL DATA:  Cough.  Vomiting.  Diaphoresis. EXAM: CHEST - 2 VIEW COMPARISON:  None. FINDINGS: The heart size and mediastinal contours are within normal limits. Both lungs are clear. The visualized skeletal structures are unremarkable. IMPRESSION: Negative two view chest x-ray Electronically Signed   By: San Morelle M.D.   On: 08/25/2017 14:27  echocardiogram.    Subjective:  Adamant about leaving , and threatening to leave AMA without medications.  Discharge Exam: Vitals:   08/26/17 0932 08/26/17 1600  BP:  (!) 123/53  Pulse:  64  Resp:  (!) 22  Temp: 98.1 F (36.7 C) 98.4 F (36.9 C)  SpO2:  93%   Vitals:   08/26/17 0429 08/26/17 0900 08/26/17 0932 08/26/17 1600  BP: (!) 127/53 (!) 118/47  (!) 123/53  Pulse: 84 83  64  Resp: 16 (!) 24  (!)  22  Temp: 98.6 F (37 C) (!) 97.1 F (36.2 C) 98.1 F (36.7 C) 98.4 F (36.9 C)  TempSrc: Oral Oral Oral Oral  SpO2: 97% 97%  93%  Weight:      Height:        General: Pt is alert, awake, not in acute distress Cardiovascular: RRR, S1/S2 +, no rubs, no gallops Respiratory: CTA bilaterally, no wheezing, no rhonchi Abdominal: Soft, NT, ND, bowel sounds + Extremities: no edema, no cyanosis    The results of significant diagnostics from this hospitalization (including imaging, microbiology, ancillary and laboratory) are listed below for reference.     Microbiology: Recent Results (from the past 240 hour(s))  Culture, blood (routine x 2)     Status: None (Preliminary result)   Collection Time: 08/25/17  7:01 PM  Result Value Ref Range Status   Specimen Description BLOOD RIGHT  ANTECUBITAL  Final   Special Requests   Final    BOTTLES DRAWN AEROBIC AND ANAEROBIC Blood Culture adequate volume   Culture   Final    NO GROWTH < 24 HOURS Performed at [facility], [address] [place], [place] [address]    Report Status PENDING  Incomplete  Culture, blood (routine x 2)     Status: None (Preliminary result)   Collection Time: 08/25/17  9:20 PM  Result Value Ref Range Status   Specimen Description BLOOD RIGHT FOREARM  Final   Special Requests   Final    BOTTLES DRAWN AEROBIC AND ANAEROBIC Blood Culture adequate volume   Culture   Final    NO GROWTH < 24 HOURS Performed at [facility], [place] [address] [place], [place] [address]    Report Status PENDING  Incomplete  Culture, sputum-assessment     Status: None   Collection Time: 08/26/17 10:16 AM  Result Value Ref Range Status   Specimen Description SPUTUM  Final   Special Requests Normal  Final   Sputum evaluation   Final    THIS SPECIMEN IS ACCEPTABLE FOR SPUTUM CULTURE Performed at [facility], [address], [place], [place] [address]    Report Status 08/26/2017 FINAL  Final  Culture, respiratory  (NON-Expectorated)     Status: None (Preliminary result)   Collection Time: 08/26/17 10:16 AM  Result Value Ref Range Status   Specimen Description SPUTUM  Final   Special Requests Normal Reflexed from [id number]  Final   Gram Stain   Final    ABUNDANT WBC PRESENT, PREDOMINANTLY PMN MODERATE GRAM POSITIVE COCCI MODERATE GRAM NEGATIVE RODS FEW GRAM POSITIVE RODS Performed at [facility], [address] [place], [place] [address]    Culture PENDING  Incomplete   Report Status PENDING  Incomplete     Labs: BNP (last 3 results) No results for input(s): BNP in the last 8760 hours. Basic Metabolic Panel: Recent Labs  Lab 08/25/17 1619 08/25/17 1631 08/26/17 0001 08/26/17 0501 08/26/17 0653 08/26/17 0947 08/26/17 1415  NA 137 136 139 135  --   --   --   K 5.7* 4.7 4.8 4.3 4.4 4.4 4.4  CL 104 104 105 103  --   --   --   CO2 22  --  24 23  --   --   --   GLUCOSE 163* 159* 245* 221*  --   --   --   BUN 14 18 21* 21*  --   --   --   CREATININE 1.37* 1.20 1.39* 1.16  --   --   --   CALCIUM 9.6  --  8.8* 8.4*  --   --   --    Liver Function Tests: Recent Labs  Lab 08/25/17 1619 08/26/17 0501 08/26/17 0653  AST 26 15  --   ALT 8* 10*  --   ALKPHOS 56 48  --   BILITOT 2.8* 1.3* 1.4*  PROT 6.8 5.6*  --   ALBUMIN 4.0 3.3*  --    No results for input(s): LIPASE, AMYLASE in the last 168 hours. No results for input(s): AMMONIA in the last 168 hours. CBC: Recent Labs  Lab 08/25/17 1619 08/25/17 1631 08/26/17 0501  WBC 20.8*  --  16.1*  NEUTROABS 18.8*  --  14.1*  HGB 16.3 17.0 13.5  HCT 49.4 50.0 41.1  MCV 99.6  --  100.2*  PLT  252  --  207   Cardiac Enzymes: No results for input(s): CKTOTAL, CKMB, CKMBINDEX, TROPONINI in the last 168 hours. BNP: Invalid input(s): POCBNP CBG: Recent Labs  Lab 08/25/17 1601 08/25/17 2324 08/26/17 0727 08/26/17 1130 08/26/17 1637  GLUCAP 160* 209* 215* 277* 327*   D-Dimer No results for input(s): DDIMER in the last 72  hours. Hgb A1c No results for input(s): HGBA1C in the last 72 hours. Lipid Profile No results for input(s): CHOL, HDL, LDLCALC, TRIG, CHOLHDL, LDLDIRECT in the last 72 hours. Thyroid function studies No results for input(s): TSH, T4TOTAL, T3FREE, THYROIDAB in the last 72 hours.  Invalid input(s): FREET3 Anemia work up No results for input(s): VITAMINB12, FOLATE, FERRITIN, TIBC, IRON, RETICCTPCT in the last 72 hours. Urinalysis    Component Value Date/Time   COLORURINE YELLOW 08/26/2017 1556   APPEARANCEUR CLEAR 08/26/2017 1556   LABSPEC 1.012 08/26/2017 1556   PHURINE 6.0 08/26/2017 1556   GLUCOSEU >=500 (A) 08/26/2017 1556   HGBUR NEGATIVE 08/26/2017 1556   BILIRUBINUR NEGATIVE 08/26/2017 1556   BILIRUBINUR neg 04/21/2013 1634   KETONESUR NEGATIVE 08/26/2017 1556   PROTEINUR NEGATIVE 08/26/2017 1556   UROBILINOGEN 1.0 11/25/2014 0348   NITRITE NEGATIVE 08/26/2017 1556   LEUKOCYTESUR NEGATIVE 08/26/2017 1556   Sepsis Labs Invalid input(s): PROCALCITONIN,  WBC,  LACTICIDVEN Microbiology Recent Results (from the past 240 hour(s))  Culture, blood (routine x 2)     Status: None (Preliminary result)   Collection Time: 08/25/17  7:01 PM  Result Value Ref Range Status   Specimen Description BLOOD RIGHT ANTECUBITAL  Final   Special Requests   Final    BOTTLES DRAWN AEROBIC AND ANAEROBIC Blood Culture adequate volume   Culture   Final    NO GROWTH < 24 HOURS Performed at [facility], [place] [address], [place], [place] [address]    Report Status PENDING  Incomplete  Culture, blood (routine x 2)     Status: None (Preliminary result)   Collection Time: 08/25/17  9:20 PM  Result Value Ref Range Status   Specimen Description BLOOD RIGHT FOREARM  Final   Special Requests   Final    BOTTLES DRAWN AEROBIC AND ANAEROBIC Blood Culture adequate volume   Culture   Final    NO GROWTH < 24 HOURS Performed at [facility], [place] [address] [place], [place] [address]    Report  Status PENDING  Incomplete  Culture, sputum-assessment     Status: None   Collection Time: 08/26/17 10:16 AM  Result Value Ref Range Status   Specimen Description SPUTUM  Final   Special Requests Normal  Final   Sputum evaluation   Final    THIS SPECIMEN IS ACCEPTABLE FOR SPUTUM CULTURE Performed at [facility], [address], [place], [place] [address]    Report Status 08/26/2017 FINAL  Final  Culture, respiratory (NON-Expectorated)     Status: None (Preliminary result)   Collection Time: 08/26/17 10:16 AM  Result Value Ref Range Status   Specimen Description SPUTUM  Final   Special Requests Normal Reflexed from [id number]  Final   Gram Stain   Final    ABUNDANT WBC PRESENT, PREDOMINANTLY PMN MODERATE GRAM POSITIVE COCCI MODERATE GRAM NEGATIVE RODS FEW GRAM POSITIVE RODS Performed at [facility], [address] [place],  [address]    Culture PENDING  Incomplete   Report Status PENDING  Incomplete     Time coordinating discharge: 35  minutes  SIGNED:  Hosie Poisson, MD  [facility] 08/27/2017, 7:45 AM Pager   If 7PM-7AM, please contact night-coverage [email or web address] Password [email or web address]

## 2017-08-28 LAB — UREA NITROGEN, URINE: Urea Nitrogen, Ur: 808 mg/dL

## 2017-08-28 LAB — CULTURE, RESPIRATORY: CULTURE: NORMAL

## 2017-08-28 LAB — CULTURE, RESPIRATORY W GRAM STAIN: Special Requests: NORMAL

## 2017-08-30 LAB — CULTURE, BLOOD (ROUTINE X 2)
CULTURE: NO GROWTH
Culture: NO GROWTH
SPECIAL REQUESTS: ADEQUATE
Special Requests: ADEQUATE

## 2017-09-02 ENCOUNTER — Ambulatory Visit: Payer: BLUE CROSS/BLUE SHIELD | Admitting: Pediatrics

## 2017-09-02 ENCOUNTER — Encounter: Payer: Self-pay | Admitting: Pediatrics

## 2017-09-02 VITALS — BP 144/66 | HR 73 | Temp 97.6°F | Ht 70.0 in | Wt 172.0 lb

## 2017-09-02 DIAGNOSIS — S80261A Insect bite (nonvenomous), right knee, initial encounter: Secondary | ICD-10-CM | POA: Diagnosis not present

## 2017-09-02 DIAGNOSIS — W57XXXA Bitten or stung by nonvenomous insect and other nonvenomous arthropods, initial encounter: Secondary | ICD-10-CM

## 2017-09-02 MED ORDER — DOXYCYCLINE HYCLATE 100 MG PO TABS: 100.0000 mg | ORAL_TABLET | Freq: Two times a day (BID) | ORAL | 0 refills | Status: DC

## 2017-09-02 NOTE — Patient Instructions (Signed)
Schedule diabetes follow up with PCP

## 2017-09-02 NOTE — Progress Notes (Signed)
  Subjective:   Patient ID: Thomas Medici., male    DOB: 1956-01-20, 62 y.o.   MRN: [id number] CC: Insect Bite (Tick, Right leg)  HPI: Thomas Episcopo. is a 62 y.o. male   Take bite behind right knee that he noticed a few days ago.  He does not remember pulling off the tick.  He is not sure how long it was attached.  Knee feels sore.  Has not taken anything for it.  Recently finished Augmentin for community-acquired pneumonia.  Relevant past medical, surgical, family and social history reviewed. Allergies and medications reviewed and updated. Social History   Tobacco Use  Smoking Status Current Every Day Smoker  . Packs/day: 2.00  . Years: 30.00  . Pack years: 60.00  . Types: Cigarettes  Smokeless Tobacco Former Systems developer  . Quit date: 06/08/1978   ROS: Per HPI   Objective:    BP (!) 144/66   Pulse 73   Temp 97.6 F (36.4 C) (Oral)   Ht 5\' 10"  (1.778 m)   Wt 172 lb (78 kg)   BMI 24.68 kg/m   Wt Readings from Last 3 Encounters:  09/02/17 172 lb (78 kg)  08/26/17 163 lb 5.8 oz (74.1 kg)  08/25/17 162 lb (73.5 kg)    Gen: NAD, alert, cooperative with exam, NCAT EYES: EOMI, no conjunctival injection, or no icterus ENT: OP without erythema LYMPH: no cervical LAD CV: NRRR, normal S1/S2, no murmur Resp: CTABL, no wheezes, normal WOB Ext: No edema, warm Neuro: Alert and oriented Skin: Approximately 3 cm diameter circular purple rash around central 2 mm pink papule lateral posterior right knee.  Assessment & Plan:  Thomas Sandoval was seen today for insect bite.  Diagnoses and all orders for this visit:  Tick bite, initial encounter Rash could be purple bruising, given central clearing we will go ahead and treat with doxycycline. -     doxycycline (VIBRA-TABS) 100 MG tablet; Take 1 tablet (100 mg total) by mouth 2 (two) times daily.   Follow up plan: Return in about 2 weeks (around 09/16/2017). Assunta Found, MD [place]

## 2017-09-21 ENCOUNTER — Other Ambulatory Visit: Payer: Self-pay | Admitting: Nurse Practitioner

## 2017-09-21 DIAGNOSIS — I10 Essential (primary) hypertension: Secondary | ICD-10-CM

## 2017-10-20 ENCOUNTER — Other Ambulatory Visit: Payer: Self-pay | Admitting: Nurse Practitioner

## 2017-10-20 DIAGNOSIS — I10 Essential (primary) hypertension: Secondary | ICD-10-CM

## 2017-10-29 ENCOUNTER — Ambulatory Visit: Payer: BLUE CROSS/BLUE SHIELD | Admitting: Pediatrics

## 2017-10-29 ENCOUNTER — Encounter: Payer: Self-pay | Admitting: Pediatrics

## 2017-10-29 VITALS — BP 126/69 | HR 71 | Temp 97.5°F | Ht 70.0 in | Wt 172.0 lb

## 2017-10-29 DIAGNOSIS — J410 Simple chronic bronchitis: Secondary | ICD-10-CM | POA: Diagnosis not present

## 2017-10-29 DIAGNOSIS — E119 Type 2 diabetes mellitus without complications: Secondary | ICD-10-CM | POA: Diagnosis not present

## 2017-10-29 DIAGNOSIS — E785 Hyperlipidemia, unspecified: Secondary | ICD-10-CM

## 2017-10-29 DIAGNOSIS — G8929 Other chronic pain: Secondary | ICD-10-CM

## 2017-10-29 DIAGNOSIS — R5383 Other fatigue: Secondary | ICD-10-CM | POA: Diagnosis not present

## 2017-10-29 DIAGNOSIS — Z794 Long term (current) use of insulin: Secondary | ICD-10-CM

## 2017-10-29 DIAGNOSIS — M545 Low back pain: Secondary | ICD-10-CM

## 2017-10-29 DIAGNOSIS — I4891 Unspecified atrial fibrillation: Secondary | ICD-10-CM

## 2017-10-29 LAB — BAYER DCA HB A1C WAIVED: HB A1C (BAYER DCA - WAIVED): 6.1 % (ref <7.0)

## 2017-10-29 NOTE — Progress Notes (Signed)
Subjective:   Patient ID: Thomas Sandoval., male    DOB: Dec 12, 1955, 62 y.o.   MRN: [id number] CC: Fatigue HPI: Thomas Sandoval. is a 62 y.o. male   Works on a farm, labor throughout the day.  He does back exercises in the morning, helps improve pain from degenerative disc disease.  He is on opioid medication in the past for his chronic back pain.  He does not like coming to the doctor regularly so is no longer on it.  Not taking anything for pain now.  Diabetes: Check his blood sugars regularly throughout the day.  His morning sugars into the 50s at times.  Usually 70s to 80s in the morning.  Up to mid 100s in the middle of the day after eating.  Rarely over 200.  COPD: Taking Advair once a day in the morning  Atrial fibrillation: Recently admitted for community-acquired pneumonia, had episode of A. fib with RVR.  Converted to normal sinus rhythm with diltiazem.  He was taking albuterol much more than usual the days leading up to admission.  He denies any chest palpitations, racing heart rate now.  He never started Eliquis which is recommended to him on the hospital as he is worried about bleeding too much on the job.  Relevant past medical, surgical, family and social history reviewed. Allergies and medications reviewed and updated. Social History   Tobacco Use  Smoking Status Current Every Day Smoker  . Packs/day: 2.00  . Years: 30.00  . Pack years: 60.00  . Types: Cigarettes  Smokeless Tobacco Former Systems developer  . Quit date: 06/08/1978   ROS: Per HPI   Objective:    BP 126/69   Pulse 71   Temp (!) 97.5 F (36.4 C) (Oral)   Ht _0  (1.778 m)   Wt 172 lb (78 kg)   BMI 24.68 kg/m   Wt Readings from Last 3 Encounters:  10/29/17 172 lb (78 kg)  09/02/17 172 lb (78 kg)  08/26/17 163 lb 5.8 oz (74.1 kg)    Gen: NAD, alert, cooperative with exam, NCAT EYES: EOMI, no conjunctival injection, or no icterus ENT:   OP without erythema LYMPH: no cervical LAD CV: NRRR, normal  S1/S2, no murmur, distal pulses 2+ b/l Resp: slight exp wheezes b/l, normal WOB Abd: +BS, soft, NTND. no guarding or organomegaly Ext: No edema, warm Neuro: Alert and oriented, strength equal b/l UE and LE, coordination grossly normal MSK: normal muscle bulk  Assessment & Plan:   62 year old male here for follow-up and fatigue.  Diagnoses and all orders for this visit:  Hyperlipidemia, unspecified hyperlipidemia type -     Lipid panel  Simple chronic bronchitis (HCC) Slight wheezing.  Encouraged decrease in tobacco use.  Type 2 diabetes mellitus without complication, with long-term current use of insulin (HCC) Having too many low blood sugar numbers.  Discussed avoiding any lows less than 80. -     CMP14+EGFR -     Bayer DCA Hb A1c Waived  Other fatigue -     CBC with Differential/Platelet  Chronic bilateral low back pain without sciatica Discussed options, patient does not want regularly come to office visits.  He will continue back exercises he is doing at home, Tylenol, Aspercreme as needed.  Atrial fibrillation, unspecified type ([place]) Does not want to be on blood thinner.  Discussed risk of stroke.  Chads vasc score of 2 (diabetes, HTN), though he is on low dose lisinopril primarily for renal protection.  Tobacco use Not interested in cessation.  Has tried patches, medications in the past.  Follow up plan: Return in about 3 months (around 01/29/2018). Assunta Found, MD [place]

## 2017-10-30 LAB — CMP14+EGFR
ALT: 6 IU/L (ref 0–44)
AST: 14 IU/L (ref 0–40)
Albumin/Globulin Ratio: 2 (ref 1.2–2.2)
Albumin: 4.5 g/dL (ref 3.6–4.8)
Alkaline Phosphatase: 54 IU/L (ref 39–117)
BUN/Creatinine Ratio: 15 (ref 10–24)
BUN: 17 mg/dL (ref 8–27)
Bilirubin Total: 1 mg/dL (ref 0.0–1.2)
CALCIUM: 9.9 mg/dL (ref 8.6–10.2)
CHLORIDE: 98 mmol/L (ref 96–106)
CO2: 24 mmol/L (ref 20–29)
Creatinine, Ser: 1.13 mg/dL (ref 0.76–1.27)
GFR calc Af Amer: 81 mL/min/{1.73_m2} (ref >59)
GFR, EST NON AFRICAN AMERICAN: 70 mL/min/{1.73_m2} (ref >59)
GLUCOSE: 202 mg/dL — AB (ref 65–99)
Globulin, Total: 2.2 g/dL (ref 1.5–4.5)
Potassium: 5.5 mmol/L — ABNORMAL HIGH (ref 3.5–5.2)
Sodium: 134 mmol/L (ref 134–144)
TOTAL PROTEIN: 6.7 g/dL (ref 6.0–8.5)

## 2017-10-30 LAB — CBC WITH DIFFERENTIAL/PLATELET
BASOS ABS: 0.1 10*3/uL (ref 0.0–0.2)
Basos: 1 %
EOS (ABSOLUTE): 0.2 10*3/uL (ref 0.0–0.4)
EOS: 4 %
HEMATOCRIT: 45 % (ref 37.5–51.0)
Hemoglobin: 15.3 g/dL (ref 13.0–17.7)
IMMATURE GRANS (ABS): 0 10*3/uL (ref 0.0–0.1)
Immature Granulocytes: 1 %
LYMPHS: 19 %
Lymphocytes Absolute: 1.1 10*3/uL (ref 0.7–3.1)
MCH: 33.7 pg — ABNORMAL HIGH (ref 26.6–33.0)
MCHC: 34 g/dL (ref 31.5–35.7)
MCV: 99 fL — ABNORMAL HIGH (ref 79–97)
Monocytes Absolute: 0.8 10*3/uL (ref 0.1–0.9)
Monocytes: 13 %
NEUTROS ABS: 3.5 10*3/uL (ref 1.4–7.0)
Neutrophils: 62 %
Platelets: 236 10*3/uL (ref 150–450)
RBC: 4.54 x10E6/uL (ref 4.14–5.80)
RDW: 12.1 % — ABNORMAL LOW (ref 12.3–15.4)
WBC: 5.7 10*3/uL (ref 3.4–10.8)

## 2017-10-30 LAB — LIPID PANEL
CHOL/HDL RATIO: 3.2 ratio (ref 0.0–5.0)
CHOLESTEROL TOTAL: 110 mg/dL (ref 100–199)
HDL: 34 mg/dL — AB (ref >39)
LDL CALC: 55 mg/dL (ref 0–99)
TRIGLYCERIDES: 106 mg/dL (ref 0–149)
VLDL Cholesterol Cal: 21 mg/dL (ref 5–40)

## 2017-11-03 ENCOUNTER — Telehealth: Payer: Self-pay | Admitting: Nurse Practitioner

## 2017-11-04 ENCOUNTER — Other Ambulatory Visit: Payer: Self-pay | Admitting: *Deleted

## 2017-11-04 DIAGNOSIS — E875 Hyperkalemia: Secondary | ICD-10-CM

## 2017-11-11 NOTE — Telephone Encounter (Signed)
Patient is aware of results as of 11/04/17 per Thomas Sandoval's note on lab results

## 2017-12-07 ENCOUNTER — Other Ambulatory Visit: Payer: Self-pay | Admitting: Nurse Practitioner

## 2017-12-07 DIAGNOSIS — J410 Simple chronic bronchitis: Secondary | ICD-10-CM

## 2018-01-18 ENCOUNTER — Other Ambulatory Visit: Payer: Self-pay | Admitting: Nurse Practitioner

## 2018-01-18 DIAGNOSIS — I10 Essential (primary) hypertension: Secondary | ICD-10-CM

## 2018-02-23 ENCOUNTER — Other Ambulatory Visit: Payer: Self-pay | Admitting: Nurse Practitioner

## 2018-02-23 DIAGNOSIS — E1122 Type 2 diabetes mellitus with diabetic chronic kidney disease: Secondary | ICD-10-CM

## 2018-02-23 DIAGNOSIS — N182 Chronic kidney disease, stage 2 (mild): Principal | ICD-10-CM

## 2018-02-23 DIAGNOSIS — Z794 Long term (current) use of insulin: Principal | ICD-10-CM

## 2018-02-23 NOTE — Telephone Encounter (Signed)
Last seen 10/29/17

## 2018-02-24 ENCOUNTER — Other Ambulatory Visit: Payer: Self-pay | Admitting: Family

## 2018-02-24 DIAGNOSIS — E1122 Type 2 diabetes mellitus with diabetic chronic kidney disease: Secondary | ICD-10-CM

## 2018-02-24 DIAGNOSIS — Z794 Long term (current) use of insulin: Principal | ICD-10-CM

## 2018-02-24 DIAGNOSIS — N182 Chronic kidney disease, stage 2 (mild): Principal | ICD-10-CM

## 2018-02-24 NOTE — Telephone Encounter (Signed)
Last refill without being seen 

## 2018-02-25 NOTE — Telephone Encounter (Signed)
Pt aware last refill without being seen but declined scheduling appt now.

## 2018-04-22 ENCOUNTER — Other Ambulatory Visit: Payer: Self-pay | Admitting: Nurse Practitioner

## 2018-04-22 DIAGNOSIS — I10 Essential (primary) hypertension: Secondary | ICD-10-CM

## 2018-04-26 ENCOUNTER — Encounter: Payer: Self-pay | Admitting: Nurse Practitioner

## 2018-04-26 ENCOUNTER — Ambulatory Visit: Payer: BLUE CROSS/BLUE SHIELD | Admitting: Nurse Practitioner

## 2018-04-26 VITALS — BP 148/75 | HR 73 | Temp 96.7°F | Ht 70.0 in | Wt 182.0 lb

## 2018-04-26 DIAGNOSIS — J069 Acute upper respiratory infection, unspecified: Secondary | ICD-10-CM | POA: Diagnosis not present

## 2018-04-26 MED ORDER — FLUTICASONE PROPIONATE 50 MCG/ACT NA SUSP: 2.0000 | Freq: Every day | NASAL | 6 refills | Status: DC

## 2018-04-26 MED ORDER — DOXYCYCLINE HYCLATE 100 MG PO TABS: 100.0000 mg | ORAL_TABLET | Freq: Two times a day (BID) | ORAL | 0 refills | Status: DC

## 2018-04-26 MED ORDER — BENZONATATE 100 MG PO CAPS: 100.0000 mg | ORAL_CAPSULE | Freq: Three times a day (TID) | ORAL | 0 refills | Status: DC | PRN

## 2018-04-26 NOTE — Patient Instructions (Signed)

## 2018-04-26 NOTE — Progress Notes (Signed)
Subjective:    Patient ID: Thomas Medici., male    DOB: Sep 13, 1955, 63 y.o.   MRN: [id number]   Chief Complaint: Sinus Problem and Nasal Congestion   HPI Patient come sin today accompanied by his wife. He is c/o sinus congestion, and headache. Started 4 days ago. He has only been taking motrin OTC.   Review of Systems  Constitutional: Negative for chills and fever.  HENT: Positive for congestion, rhinorrhea, sinus pressure and sinus pain. Negative for sore throat and trouble swallowing.   Respiratory: Positive for cough (slight).   Gastrointestinal: Negative.   Skin: Negative.   Neurological: Positive for headaches.  Psychiatric/Behavioral: Negative.        Objective:   Physical Exam Vitals signs and nursing note reviewed.  Constitutional:      General: He is in acute distress (mild).     Appearance: He is normal weight.  HENT:     Right Ear: Hearing, tympanic membrane, ear canal and external ear normal.     Left Ear: Hearing, tympanic membrane, ear canal and external ear normal.     Nose: Mucosal edema, congestion and rhinorrhea present.     Right Turbinates: Pale.     Left Turbinates: Pale.     Right Sinus: Maxillary sinus tenderness present. No frontal sinus tenderness.     Left Sinus: Maxillary sinus tenderness present. No frontal sinus tenderness.     Mouth/Throat:     Lips: Pink.     Pharynx: Oropharynx is clear.  Neck:     Musculoskeletal: Normal range of motion.  Cardiovascular:     Rate and Rhythm: Normal rate and regular rhythm.     Heart sounds: Normal heart sounds.  Pulmonary:     Breath sounds: Wheezing (insp and exp wheezes) present.     Comments: Deep tight cough Lymphadenopathy:     Cervical: No cervical adenopathy.  Skin:    General: Skin is warm and dry.  Neurological:     General: No focal deficit present.     Mental Status: He is alert and oriented to person, place, and time.  Psychiatric:        Mood and Affect: Mood normal.       Behavior: Behavior normal.     BP (!) 148/75   Pulse 73   Temp (!) 96.7 F (35.9 C) (Oral)   Ht 5\' 10"  (1.778 m)   Wt 182 lb (82.6 kg)   BMI 26.11 kg/m        Assessment & Plan:  Thomas Medici. in today with chief complaint of Sinus Problem and Nasal Congestion   1. Upper respiratory infection with cough and congestion 1. Take meds as prescribed 2. Use a cool mist humidifier especially during the winter months and when heat has been humid. 3. Use saline nose sprays frequently 4. Saline irrigations of the nose can be very helpful if done frequently.  * 4X daily for 1 week*  * Use of a nettie pot can be helpful with this. Follow directions with this* 5. Drink plenty of fluids 6. Keep thermostat turn down low 7.For any cough or congestion  Use plain Mucinex- regular strength or max strength is fine   * Children- consult with Pharmacist for dosing 8. For fever or aces or pains- take tylenol or ibuprofen appropriate for age and weight.  * for fevers greater than 101 orally you may alternate ibuprofen and tylenol every  3 hours.    - doxycycline (  VIBRA-TABS) 100 MG tablet; Take 1 tablet (100 mg total) by mouth 2 (two) times daily. 1 po bid  Dispense: 20 tablet; Refill: 0 - benzonatate (TESSALON PERLES) 100 MG capsule; Take 1 capsule (100 mg total) by mouth 3 (three) times daily as needed for cough.  Dispense: 20 capsule; Refill: 0 - fluticasone (FLONASE) 50 MCG/ACT nasal spray; Place 2 sprays into both nostrils daily.  Dispense: 16 g; Refill: [place], FNP

## 2018-04-27 ENCOUNTER — Other Ambulatory Visit: Payer: Self-pay | Admitting: Nurse Practitioner

## 2018-04-27 DIAGNOSIS — N182 Chronic kidney disease, stage 2 (mild): Principal | ICD-10-CM

## 2018-04-27 DIAGNOSIS — E1122 Type 2 diabetes mellitus with diabetic chronic kidney disease: Secondary | ICD-10-CM

## 2018-04-27 DIAGNOSIS — Z794 Long term (current) use of insulin: Principal | ICD-10-CM

## 2018-04-29 ENCOUNTER — Telehealth: Payer: Self-pay | Admitting: Nurse Practitioner

## 2018-04-29 NOTE — Telephone Encounter (Signed)
Patient aware he may use flonase 1 spray each nostril twice daily

## 2018-05-02 ENCOUNTER — Telehealth: Payer: Self-pay | Admitting: Nurse Practitioner

## 2018-05-04 ENCOUNTER — Other Ambulatory Visit: Payer: Self-pay | Admitting: Nurse Practitioner

## 2018-05-04 DIAGNOSIS — Z794 Long term (current) use of insulin: Principal | ICD-10-CM

## 2018-05-04 DIAGNOSIS — E1122 Type 2 diabetes mellitus with diabetic chronic kidney disease: Secondary | ICD-10-CM

## 2018-05-04 DIAGNOSIS — N182 Chronic kidney disease, stage 2 (mild): Principal | ICD-10-CM

## 2018-05-04 MED ORDER — INSULIN ASPART 100 UNIT/ML ~~LOC~~ SOLN: SUBCUTANEOUS | 0 refills | Status: DC

## 2018-05-04 NOTE — Telephone Encounter (Signed)
Pt informed he must be seen for refills Pt adamantly refused appt

## 2018-05-04 NOTE — Telephone Encounter (Signed)
TC to pt, refill was not received by pharmacy, informed pt of this, therefore it was not denied. Explained that him & I have had this discussion that he has to be seen every 3 months for his diabetes. He said ' look lady are you going to send in my insulin or not' I tried to explain again that he need to be seen, then he hung up

## 2018-05-04 NOTE — Telephone Encounter (Signed)
Yes he needs to be seen = has not had labs done since 10/2017- I will do one refill but needs to make appointment

## 2018-05-06 ENCOUNTER — Other Ambulatory Visit: Payer: Self-pay | Admitting: Nurse Practitioner

## 2018-05-06 DIAGNOSIS — J410 Simple chronic bronchitis: Secondary | ICD-10-CM

## 2018-05-18 ENCOUNTER — Other Ambulatory Visit: Payer: Self-pay | Admitting: Nurse Practitioner

## 2018-05-18 DIAGNOSIS — Z794 Long term (current) use of insulin: Principal | ICD-10-CM

## 2018-05-18 DIAGNOSIS — N182 Chronic kidney disease, stage 2 (mild): Principal | ICD-10-CM

## 2018-05-18 DIAGNOSIS — E1122 Type 2 diabetes mellitus with diabetic chronic kidney disease: Secondary | ICD-10-CM

## 2018-07-18 ENCOUNTER — Other Ambulatory Visit: Payer: Self-pay | Admitting: Nurse Practitioner

## 2018-07-18 DIAGNOSIS — I10 Essential (primary) hypertension: Secondary | ICD-10-CM

## 2018-07-18 NOTE — Telephone Encounter (Signed)
Needs appt last labs and ov 10/2017

## 2018-07-21 ENCOUNTER — Other Ambulatory Visit: Payer: Self-pay | Admitting: Nurse Practitioner

## 2018-07-21 DIAGNOSIS — Z794 Long term (current) use of insulin: Secondary | ICD-10-CM

## 2018-07-21 DIAGNOSIS — E1122 Type 2 diabetes mellitus with diabetic chronic kidney disease: Secondary | ICD-10-CM

## 2018-07-28 ENCOUNTER — Telehealth: Payer: Self-pay | Admitting: Nurse Practitioner

## 2018-08-14 ENCOUNTER — Other Ambulatory Visit: Payer: Self-pay | Admitting: Nurse Practitioner

## 2018-08-14 DIAGNOSIS — J410 Simple chronic bronchitis: Secondary | ICD-10-CM

## 2018-08-23 ENCOUNTER — Telehealth: Payer: Self-pay | Admitting: Nurse Practitioner

## 2018-08-23 NOTE — Telephone Encounter (Signed)
Patient was made aware of need for lab work in previous phone call.

## 2018-08-23 NOTE — Telephone Encounter (Signed)
Patient says he will not schedule an appointment or come back in the office for visit or lab work.  He says it is a money gimmick!  He says if he runs out of medication , he can just die!

## 2018-08-23 NOTE — Telephone Encounter (Signed)
He has to be seen. Has not had an A1C since 10/2017. This should be monitored at least every 3 to 6 months.

## 2018-08-23 NOTE — Telephone Encounter (Signed)
What is the name of the medication? lantus and syringes pt only has a week left  Have you contacted your pharmacy to request a refill? no  Which pharmacy would you like this sent to? [facility]   Patient notified that their request is being sent to the clinical staff for review and that they should receive a call once it is complete. If they do not receive a call within 24 hours they can check with their pharmacy or our office.

## 2018-09-05 NOTE — Progress Notes (Addendum)
Virtual Visit via telephone Note  I connected with Thomas Medici. on 09/05/18 at 8:10 by telephone and verified that I am speaking with the correct person using two identifiers. Thomas Medici. is currently located at home and his wife is currently with her during visit. The provider, [name] Hassell Done, FNP is located in their office at time of visit.  I discussed the limitations, risks, security and privacy concerns of performing an evaluation and management service by telephone and the availability of in person appointments. I also discussed with the patient that there may be a patient responsible charge related to this service. The patient expressed understanding and agreed to proceed.   History and Present Illness:   Chief Complaint: medical management of chronic issues   HPI:  1. Essential hypertension No c/o chest pain, sob or headache. Does not check blood pressure at home. BP Readings from Last 3 Encounters:  04/26/18 (!) 148/75  10/29/17 126/69  09/02/17 (!) 144/66     2. Gastroesophageal reflux disease with esophagitis Is on pepcid and works well for him  3. Simple chronic bronchitis ([place]) He uses his advair disc. He said yesterday was bad. He was working outside and breathing was trouble. He is much better today. He is still smoking over a 1/2 pack a day.  4. Type 2 diabetes mellitus without complication, with long-term current use of insulin (HCC) Last HGBA1c was 7.1%. he checks hois blood sugar 2-3 x a day. Is running around 80-140. He has no hypoglycemia  5. Mild renal insufficiency Last creatine 1.13. will recheck at next visit    Outpatient Encounter Medications as of 09/06/2018  Medication Sig  . albuterol (PROVENTIL HFA;VENTOLIN HFA) 108 (90 Base) MCG/ACT inhaler Inhale 2 puffs every 6 (six) hours as needed into the lungs for wheezing or shortness of breath.  Marland Kitchen aspirin 81 MG tablet Take 81 mg by mouth daily.  . benzonatate (TESSALON  PERLES) 100 MG capsule Take 1 capsule (100 mg total) by mouth 3 (three) times daily as needed for cough.  . doxycycline (VIBRA-TABS) 100 MG tablet Take 1 tablet (100 mg total) by mouth 2 (two) times daily. 1 po bid  . famotidine (PEPCID) 20 MG tablet Take 1 tablet (20 mg total) by mouth every morning. Reported on 04/16/2015  . fluticasone (FLONASE) 50 MCG/ACT nasal spray Place 2 sprays into both nostrils daily.  . Fluticasone-Salmeterol (ADVAIR DISKUS) 250-50 MCG/DOSE AEPB INHALE 1 PUFF INTO THE LUNGS EVERY EVENING.  . insulin aspart (NOVOLOG) 100 UNIT/ML injection PER SLIDING SCALE: 190 - 200 = 2 UNITS 300 AND ABOVE = 7 UNITS  . insulin glargine (LANTUS) 100 UNIT/ML injection INJECT 40 UNITS SUBCUTANEOUSLY EVERY MORNING (NEEDS TO BE SEEN BEFORE NEXT REFILL)  . lisinopril (ZESTRIL) 5 MG tablet TAKE 1 TABLET BY MOUTH EVERY DAY  . loratadine (CLARITIN) 10 MG tablet Take 10 mg by mouth at bedtime.    . TRELEGY ELLIPTA 100-62.5-25 MCG/INH AEPB TAKE 1 PUFF BY MOUTH EVERY DAY     Past Surgical History:  Procedure Laterality Date  . CYSTOSCOPY WITH URETEROSCOPY Right 08/14/2013   Procedure: CYSTOSCOPY WITH URETEROSCOPY BLADDER BIOPSY ;  Surgeon: Claybon Jabs, MD;  Location: [facility];  Service: Urology;  Laterality: Right;  . ESOPHAGOGASTRODUODENOSCOPY N/A 02/06/2013   Procedure: ESOPHAGOGASTRODUODENOSCOPY (EGD);  Surgeon: Lafayette Dragon, MD;  Location: [facility] ENDOSCOPY;  Service: Endoscopy;  Laterality: N/A;  . LAPAROSCOPY N/A 11/25/2014   Procedure: LAPAROSCOPIC PRIMARY REPAIR OF PERFORATED PREPYLORIC  ULCER WITH Silvestre Gunner;  Surgeon: Greer Pickerel, MD;  Location: [place];  Service: General;  Laterality: N/A;  . TONSILLECTOMY  as child  . TRANSTHORACIC ECHOCARDIOGRAM  02-17-2011   MODERATE LVH/  EF 65%  . TRANSURETHRAL RESECTION OF BLADDER TUMOR WITH GYRUS (TURBT-GYRUS) N/A 06/12/2013   Procedure: TRANSURETHRAL RESECTION OF BLADDER TUMOR WITH GYRUS (TURBT-GYRUS);  Surgeon: Claybon Jabs, MD;   Location: [facility];  Service: Urology;  Laterality: N/A;  . TYMPANIC MEMBRANE REPAIR  as child    Family History  Problem Relation Age of Onset  . Breast cancer Mother   . Cancer Mother        Breast  . Rheumatic fever Father   . Heart disease Father   . Heart attack Father        Massive   . 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   HENT: Negative.   Respiratory: Positive for cough, sputum production and shortness of breath (occasional).   Cardiovascular: Negative for chest pain, palpitations and leg swelling.  Genitourinary: Negative.   Skin: Negative.   Neurological: Negative.   Psychiatric/Behavioral: Negative.   All other systems reviewed and are negative.    Observations/Objective: Alert and oriented- answers all questions appropriately Mild SOB when talking  Assessment and Plan: .Thomas Medici. comes in today with chief complaint of medical management of chronic issues  Diagnosis and orders addressed:  1. Essential hypertension Low sodium diet - lisinopril (ZESTRIL) 5 MG tablet; Take 1 tablet (5 mg total) by mouth daily.  Dispense: 90 tablet; Refill: 1 - CMP14+EGFR - Lipid panel  2. Gastroesophageal reflux disease with esophagitis Avoid spicy foods Do not eat 2 hours prior to bedtime - famotidine (PEPCID) 20 MG tablet; Take 1 tablet (20 mg total) by mouth every morning. Reported on 04/16/2015  Dispense: 30 tablet; Refill: 5  3. COPD Continue all inhalers - Fluticasone-Umeclidin-Vilant (TRELEGY ELLIPTA) 100-62.5-25 MCG/INH AEPB; Inhale 2 puffs into the lungs daily.  Dispense: 60 each; Refill: 5  4. Mild renal insufficiency Will check labs when done- patient to come in and have done   5. Type 2 diabetes mellitus with stage 2 chronic kidney disease, with long-term current use of insulin (HCC) Continue to watch carbs in diet  - insulin aspart (NOVOLOG) 100 UNIT/ML injection; PER SLIDING SCALE: 190 - 200 = 2 UNITS 300 AND ABOVE = 7 UNITS  Dispense: 30 mL; Refill: 0 - insulin glargine (LANTUS) 100 UNIT/ML injection; INJECT 40 UNITS SUBCUTANEOUSLY EVERY MORNING (NEEDS TO BE SEEN BEFORE NEXT REFILL)  Dispense: 10 mL; Refill: 2 - Insulin Syringes, Disposable, U-100 1 ML MISC; Use 4 times a day for insulin injection  Dispense: 400 each; Refill: 5 - Bayer DCA Hb A1c Waived   Labs pending Health Maintenance reviewed Diet and exercise encouraged  Follow up plan: 3 months     I discussed the assessment and treatment plan with the patient. The patient was provided an opportunity to ask questions and all were answered. The patient agreed with the plan and demonstrated an understanding of the instructions.   The patient was advised to call back or seek an in-person evaluation if the symptoms worsen or if the condition fails to improve as anticipated.  The above assessment and management plan was discussed with the patient. The patient verbalized understanding of and has agreed to the  management plan. Patient is aware to call the clinic if symptoms persist or worsen. Patient is aware when to return to the clinic for a follow-up visit. Patient educated on when it is appropriate to go to the emergency department.   Time call ended:  8:20  I provided 15 minutes of non-face-to-face time during this encounter.    [name] Hassell Done, FNP

## 2018-09-06 ENCOUNTER — Ambulatory Visit (INDEPENDENT_AMBULATORY_CARE_PROVIDER_SITE_OTHER): Payer: BC Managed Care – PPO | Admitting: Nurse Practitioner

## 2018-09-06 ENCOUNTER — Encounter: Payer: Self-pay | Admitting: Nurse Practitioner

## 2018-09-06 DIAGNOSIS — K2100 Gastro-esophageal reflux disease with esophagitis, without bleeding: Secondary | ICD-10-CM

## 2018-09-06 DIAGNOSIS — J418 Mixed simple and mucopurulent chronic bronchitis: Secondary | ICD-10-CM

## 2018-09-06 DIAGNOSIS — I10 Essential (primary) hypertension: Secondary | ICD-10-CM

## 2018-09-06 DIAGNOSIS — N182 Chronic kidney disease, stage 2 (mild): Secondary | ICD-10-CM

## 2018-09-06 DIAGNOSIS — N289 Disorder of kidney and ureter, unspecified: Secondary | ICD-10-CM | POA: Diagnosis not present

## 2018-09-06 DIAGNOSIS — Z794 Long term (current) use of insulin: Secondary | ICD-10-CM

## 2018-09-06 DIAGNOSIS — E1122 Type 2 diabetes mellitus with diabetic chronic kidney disease: Secondary | ICD-10-CM

## 2018-09-06 MED ORDER — FAMOTIDINE 20 MG PO TABS: 20.0000 mg | ORAL_TABLET | Freq: Every morning | ORAL | 5 refills | Status: DC

## 2018-09-06 MED ORDER — INSULIN GLARGINE 100 UNIT/ML ~~LOC~~ SOLN: SUBCUTANEOUS | 2 refills | Status: DC

## 2018-09-06 MED ORDER — TRELEGY ELLIPTA 100-62.5-25 MCG/INH IN AEPB: 2.0000 | INHALATION_SPRAY | Freq: Every day | RESPIRATORY_TRACT | 5 refills | Status: DC

## 2018-09-06 MED ORDER — INSULIN SYRINGES (DISPOSABLE) U-100 1 ML MISC: 5 refills | Status: DC

## 2018-09-06 MED ORDER — LISINOPRIL 5 MG PO TABS: 5.0000 mg | ORAL_TABLET | Freq: Every day | ORAL | 1 refills | Status: DC

## 2018-09-06 MED ORDER — INSULIN ASPART 100 UNIT/ML ~~LOC~~ SOLN: SUBCUTANEOUS | 0 refills | Status: DC

## 2018-09-16 ENCOUNTER — Encounter: Payer: Self-pay | Admitting: Nurse Practitioner

## 2018-09-16 ENCOUNTER — Other Ambulatory Visit: Payer: Self-pay

## 2018-09-16 ENCOUNTER — Ambulatory Visit: Payer: BC Managed Care – PPO | Admitting: Nurse Practitioner

## 2018-09-16 VITALS — BP 160/86 | HR 80 | Temp 98.2°F | Ht 70.0 in | Wt 177.0 lb

## 2018-09-16 DIAGNOSIS — R52 Pain, unspecified: Secondary | ICD-10-CM

## 2018-09-16 DIAGNOSIS — I10 Essential (primary) hypertension: Secondary | ICD-10-CM | POA: Diagnosis not present

## 2018-09-16 DIAGNOSIS — G8929 Other chronic pain: Secondary | ICD-10-CM

## 2018-09-16 LAB — BAYER DCA HB A1C WAIVED: HB A1C (BAYER DCA - WAIVED): 6.5 % (ref <7.0)

## 2018-09-16 MED ORDER — HYDROCODONE-ACETAMINOPHEN 5-325 MG PO TABS: 1.0000 | ORAL_TABLET | Freq: Four times a day (QID) | ORAL | 0 refills | Status: DC | PRN

## 2018-09-16 NOTE — Progress Notes (Signed)
Subjective:    Patient ID: Thomas Sandoval., male    DOB: 1955-05-05, 63 y.o.   MRN: [id number]   Chief Complaint: Pain   HPI Patient comes in for intitial visit for pain management.  [place] Controlled Substance Abuse database reviewed- Yes If yes- were their any concerning findings : no Depression screen [facility] 2/9 09/16/2018 04/26/2018 10/29/2017 09/02/2017 01/23/2017  Decreased Interest 0 0 0 0 0  Down, Depressed, Hopeless 0 0 0 0 0  PHQ - 2 Score 0 0 0 0 0   . Opioid Risk Tool - 09/16/18 1205      Family History of Substance Abuse   Alcohol  Negative    Illegal Drugs  Negative    Rx Drugs  Negative      Personal History of Substance Abuse   Alcohol  Negative    Illegal Drugs  Negative    Rx Drugs  Negative      Age   Age between 77-45 years   No      History of Preadolescent Sexual Abuse   History of Preadolescent Sexual Abuse  Negative or Male      Psychological Disease   Psychological Disease  Negative    Depression  Negative      Total Score   Opioid Risk Tool Scoring  0    Opioid Risk Interpretation  Low Risk          Toxassure drug screen performed- Yes  SOAPP  0= never  1= seldom  2=sometimes  3= often  4= very often  How often do you have mood swings? 0 How often do you smoke a cigarette within an hour after waling up? 4 How often have you taken medication other than the way that it was prescribed?0 How often have you used illegal drugs in the past 5 years? 0 How often, in your lifetime, have you had legal problems or been arrested? 0  Score 4  Alcohol Audit - How often during the last year have found that you: 0-Never   1- Less than monthly   2- Monthly     3-Weekly     4-daily or almost daily  - found that you were not able to stop drinking once you started- 0 -failed to do what was normally expected of you because of drinking- 0 -needed a first drink in the morning- 4- he says he drinks because he hurts in the morning. -had a  feeling of guilt or remorse after drinking- 0 -are/were unable to remember what happened the night before because of your drinking- 0  0- NO   2- yes but not in last year  4- yes during last year -Have you or someone else been injured because of your drinking- 0 - Has anyone been concerned about your drinking or suggested you cut down- 0        TOTAL- 4  ( 0-7- alcohol education, 8-15- simple advice, 16-19 simple advice plus counseling, 20-40 referral for evaluation and treatment )  Opioid Risk  09/16/2018  Alcohol 0  Illegal Drugs 0  Rx Drugs 0  Alcohol 0  Illegal Drugs 0  Rx Drugs 0  Age between 16-45 years  0  History of Preadolescent Sexual Abuse 0  Psychological Disease 0  Depression 0  Opioid Risk Tool Scoring 0  Opioid Risk Interpretation Low Risk       Designated Pharmacy- [facility]  Pain assessment: Cause of pain- arthritis. And DDD Pain  location- back and joints Pain on scale of 1-10- 6/10 Frequency- daily What increases pain-to much activity What makes pain Better- rest Effects on ADL - none  Prior treatments tried and failed- OTC meds Current opioids rx- lortab on occasion # prescribed- 30 Morphine mg equivalent- 20MEDD   Pain management agreement reviewed and signed- Yes   Review of Systems  Constitutional: Negative for activity change and appetite change.  HENT: Negative.   Eyes: Negative for pain.  Respiratory: Negative for shortness of breath.   Cardiovascular: Negative for chest pain, palpitations and leg swelling.  Gastrointestinal: Negative for abdominal pain.  Endocrine: Negative for polydipsia.  Genitourinary: Negative.   Musculoskeletal: Positive for arthralgias and back pain.  Skin: Negative for rash.  Neurological: Negative for dizziness, weakness and headaches.  Hematological: Does not bruise/bleed easily.  Psychiatric/Behavioral: Negative.   All other systems reviewed and are negative.      Objective:   Physical Exam Vitals  signs and nursing note reviewed.  Constitutional:      Appearance: Normal appearance.  Cardiovascular:     Rate and Rhythm: Normal rate and regular rhythm.     Heart sounds: Normal heart sounds.  Pulmonary:     Breath sounds: Normal breath sounds.  Abdominal:     Palpations: Abdomen is soft.  Musculoskeletal:     Comments: All over joint pain Gait slow and steady FROM of lumbar spine with flexion and extension Motor strength and sensation distally intact  Skin:    General: Skin is warm and dry.  Neurological:     General: No focal deficit present.     Mental Status: He is alert and oriented to person, place, and time.    BP (!) 160/86   Pulse 80   Temp 98.2 F (36.8 C) (Oral)   Ht 5\' 10"  (1.778 m)   Wt 177 lb (80.3 kg)   BMI 25.40 kg/m        Assessment & Plan:  Thomas Sandoval. in today with chief complaint of Pain   1. Pain management - ToxASSURE Select 13 (MW), Urine  2. Encounter for chronic pain management Will agree to do lortab 5/325 1 daily Do not drink alcohol and take pain meds Need to follow up in 1 month and repeat drug screen and should be clear in order to get more meds.  I discussed the assessment and treatment plan with the patient. The patient was provided an opportunity to ask questions and all were answered. The patient agreed with the plan and demonstrated an understanding of the instructions.  [name] Hassell Done, FNP

## 2018-09-16 NOTE — Patient Instructions (Signed)
Opioid Pain Medicine Management Opioid pain medicines are strong medicines that are used to treat bad or very bad pain. When you take them for a short time, they can help you:  Sleep better.  Do better in physical therapy.  Feel better during the first few days after you get hurt.  Recover from surgery. Only take these medicines if a doctor says that you can. You should only take them for a short time. This is because opioids can be hard to stop taking (they are addictive). The longer you take opioids, the harder it may be to stop taking them (opioid use disorder). What are the risks? Opioids can cause problems (side effects). Taking them for more than 3 days raises your chance of problems, such as:  Trouble pooping (constipation).  Feeling sick to your stomach (nausea).  Vomiting.  Feeling very sleepy.  Confusion.  Not being able to stop taking the medicine.  Breathing problems. Taking opioids for a long time can make it hard for you to do daily tasks. It can also put you at risk for:  Car accidents.  Depression.  Suicide.  Heart attack.  Taking too much of the medicine (overdose), which can sometimes lead to death. What is a pain treatment plan? A pain treatment plan is a plan made by you and your doctor. Work with your doctor to make a plan for treating your pain. To help you do this:  Talk about the goals of your treatment, including: ? How much pain you might expect to have. ? How you will manage the pain.  Talk about the risks and benefits of taking these medicines for your condition.  Remember that a good treatment plan uses more than one approach and lowers the risks of side effects.  Tell your doctor about the amount of medicines you take and about any drug or alcohol use.  Get your pain medicine prescriptions from only one doctor. Pain can be managed with other treatments. Work with your doctor to find other ways to help your pain, such as:  Physical  therapy.  Counseling.  Eating healthy foods.  Brain exercises.  Massage.  Meditation.  Other pain medicines.  Doing gentle exercises. Tapering your use of opioids If you have been taking opioids for more than a few weeks, you may need to slowly decrease (taper) how much you take until you stop taking them. Doing this can lower your chance of having symptoms, such as:  Pain and cramping in your belly (abdomen).  Feeling sick to your stomach.  Sweating.  Feeling very sleepy.  Feeling restless.  Shaking you cannot control (tremors).  Cravings for the medicine. Do not try to stop taking them by yourself. Work with your doctor to stop. Your doctor will help you take less until you are not taking the medicine at all. Follow these instructions at home: Safety and storage   While you are taking opioids: ? Do not drive. ? Do not use machines or power tools. ? Do not sign important papers (legal documents). ? Do not drink alcohol. ? Do not take sleeping pills. ? Do not take care of children by yourself. ? Do not do activities where you need to climb or be in high places, like working on a ladder. ? Do not go into any water, such as a lake, river, ocean, swimming pool, or hot tub.  Keep your opioids locked up or in a place where children cannot reach them.  Do not share your   pain medicine with anyone. Getting rid of leftover pills Do not save any leftover pills. Get rid of leftover pills safely by:  Taking them to a take-back program in your area.  Bringing them to a pharmacy that has a container for throwing away pills (pill disposal).  Throwing them in the trash. Check the label or package insert of your medicine to see whether this is safe to do. If it is safe to throw them out: 1. Take the pills out of their container. 2. Mix the pills with pet poop or food scraps. 3. Put this in the trash. Activity  Return to your normal activities as told by your doctor. Ask  your doctor what activities are safe for you.  Avoid doing things that make your pain worse.  Do exercises as told by your doctor. General instructions  You may need to take these actions to prevent or treat trouble pooping: ? Drink enough fluid to keep your pee (urine) pale yellow. ? Take over-the-counter or prescription medicines. ? Eat foods that are high in fiber. These include beans, whole grains, and fresh fruits and vegetables. ? Limit foods that are high in fat and sugar. These include fried or sweet foods.  Keep all follow-up visits as told by your doctor. This is important. Where to find support If you have been taking opioids for a long time, think about getting help quitting from a local support group or counselor. Ask your doctor about this. Where to find more information Centers for Disease Control and Prevention (CDC): http://www.wolf.info/ Get help right away if: Seek medical care right away if you are taking opioids and you, or people close to you, notice any of the following:  You have trouble breathing.  Your breathing is slower or more shallow than normal.  You have a very slow heartbeat.  You feel very confused.  You pass out (faint).  You are very sleepy.  Your speech is not normal.  You feel sick to your stomach and vomit.  You have cold skin.  You have blue lips or fingernails.  Your muscles are weak (limp) and your body seems floppy.  The black centers of your eyes (pupils) are smaller than normal. If you think that you or someone else may have taken too much of an opioid medicine, get medical help right away. Call your local emergency services (911 in the [place] Do not drive yourself to the hospital. If you ever feel like you may hurt yourself or others, or have thoughts about taking your own life, get help right away. You can go to your nearest emergency department or call:  Your local emergency services (911 in the [place]  The hotline of the [facility] (365) 297-3624 in the [place]  A suicide crisis helpline, such as the [place] at 813-129-9141. This is open 24 hours a day. Summary  Opioid are strong medicines that are used to treat bad or very bad pain.  A pain treatment plan is a plan made by you and your doctor. Work with your doctor to make a plan for treating your pain.  Work with your doctor to find other ways to help your pain.  If you think that you or someone else may have taken too much of an opioid, get help right away. This information is not intended to replace advice given to you by your health care provider. Make sure you discuss any questions you have with your health care provider.  Document Released: 12/15/2016 Document Revised: 03/25/2018 Document Reviewed: 03/25/2018 Elsevier Patient Education  2020 [employer].

## 2018-09-17 LAB — CMP14+EGFR
ALT: 9 IU/L (ref 0–44)
AST: 18 IU/L (ref 0–40)
Albumin/Globulin Ratio: 2 (ref 1.2–2.2)
Albumin: 4.3 g/dL (ref 3.8–4.9)
Alkaline Phosphatase: 62 IU/L (ref 39–117)
BUN/Creatinine Ratio: 8 — ABNORMAL LOW (ref 10–24)
BUN: 8 mg/dL (ref 8–27)
Bilirubin Total: 0.6 mg/dL (ref 0.0–1.2)
CO2: 18 mmol/L — ABNORMAL LOW (ref 20–29)
Calcium: 9.3 mg/dL (ref 8.6–10.2)
Chloride: 100 mmol/L (ref 96–106)
Creatinine, Ser: 1.05 mg/dL (ref 0.76–1.27)
GFR calc Af Amer: 89 mL/min/{1.73_m2} (ref >59)
GFR calc non Af Amer: 77 mL/min/{1.73_m2} (ref >59)
Globulin, Total: 2.2 g/dL (ref 1.5–4.5)
Glucose: 102 mg/dL — ABNORMAL HIGH (ref 65–99)
Potassium: 4.7 mmol/L (ref 3.5–5.2)
Sodium: 135 mmol/L (ref 134–144)
Total Protein: 6.5 g/dL (ref 6.0–8.5)

## 2018-09-17 LAB — LIPID PANEL
Chol/HDL Ratio: 2.5 ratio (ref 0.0–5.0)
Cholesterol, Total: 124 mg/dL (ref 100–199)
HDL: 49 mg/dL (ref >39)
LDL Calculated: 65 mg/dL (ref 0–99)
Triglycerides: 50 mg/dL (ref 0–149)
VLDL Cholesterol Cal: 10 mg/dL (ref 5–40)

## 2018-09-20 LAB — TOXASSURE SELECT 13 (MW), URINE

## 2018-10-17 ENCOUNTER — Other Ambulatory Visit: Payer: Self-pay

## 2018-10-17 ENCOUNTER — Telehealth: Payer: Self-pay | Admitting: Nurse Practitioner

## 2018-10-17 NOTE — Telephone Encounter (Signed)
Aware of results. 

## 2018-10-18 ENCOUNTER — Ambulatory Visit: Payer: BC Managed Care – PPO | Admitting: Nurse Practitioner

## 2018-10-18 ENCOUNTER — Encounter: Payer: Self-pay | Admitting: Nurse Practitioner

## 2018-10-18 VITALS — BP 155/75 | HR 70 | Temp 97.8°F | Ht 70.0 in | Wt 179.0 lb

## 2018-10-18 DIAGNOSIS — N182 Chronic kidney disease, stage 2 (mild): Secondary | ICD-10-CM

## 2018-10-18 DIAGNOSIS — M545 Low back pain: Secondary | ICD-10-CM

## 2018-10-18 DIAGNOSIS — E1122 Type 2 diabetes mellitus with diabetic chronic kidney disease: Secondary | ICD-10-CM

## 2018-10-18 DIAGNOSIS — G8929 Other chronic pain: Secondary | ICD-10-CM

## 2018-10-18 DIAGNOSIS — Z794 Long term (current) use of insulin: Secondary | ICD-10-CM

## 2018-10-18 MED ORDER — INSULIN SYRINGES (DISPOSABLE) U-100 1 ML MISC: 5 refills | Status: DC

## 2018-10-18 MED ORDER — HYDROCODONE-ACETAMINOPHEN 5-325 MG PO TABS: 1.0000 | ORAL_TABLET | Freq: Two times a day (BID) | ORAL | 0 refills | Status: AC

## 2018-10-18 MED ORDER — HYDROCODONE-ACETAMINOPHEN 5-325 MG PO TABS: 1.0000 | ORAL_TABLET | Freq: Two times a day (BID) | ORAL | 0 refills | Status: DC

## 2018-10-18 NOTE — Patient Instructions (Signed)

## 2018-10-18 NOTE — Progress Notes (Signed)
Subjective:    Patient ID: Thomas Medici., male    DOB: 06/11/1955, 63 y.o.   MRN: [id number]  [facility] Controlled Substance Abuse database reviewed- Yes If yes- were their any concerning findings : none Depression screen [facility] 2/9 10/18/2018 09/16/2018 04/26/2018 10/29/2017 09/02/2017  Decreased Interest 0 0 0 0 0  Down, Depressed, Hopeless 0 0 0 0 0  PHQ - 2 Score 0 0 0 0 0      Toxassure drug screen performed- No done 09/16/18 and was negative  SOAPP  0= never  1= seldom  2=sometimes  3= often  4= very often  How often do you have mood swings? 0 How often do you smoke a cigarette within an hour after waling up? 4 How often have you taken medication other than the way that it was prescribed?0 How often have you used illegal drugs in the past 5 years? 0 How often, in your lifetime, have you had legal problems or been arrested? 0  Score 4  Alcohol Audit - How often during the last year have found that you: 0-Never   1- Less than monthly   2- Monthly     3-Weekly     4-daily or almost daily  - found that you were not able to stop drinking once you started- 1 -failed to do what was normally expected of you because of drinking- 0 -needed a first drink in the morning- 0 -had a feeling of guilt or remorse after drinking- 0 -are/were unable to remember what happened the night before because of your drinking- 0  0- NO   2- yes but not in last year  4- yes during last year -Have you or someone else been injured because of your drinking- 0 - Has anyone been concerned about your drinking or suggested you cut down- 0        TOTAL- 1  ( 0-7- alcohol education, 8-15- simple advice, 16-19 simple advice plus counseling, 20-40 referral for evaluation and treatment )  Opioid Risk  09/16/2018  Alcohol 0  Illegal Drugs 0  Rx Drugs 0  Alcohol 0  Illegal Drugs 0  Rx Drugs 0  Age between 16-45 years  0  History of Preadolescent Sexual Abuse 0  Psychological Disease 0   Depression 0  Opioid Risk Tool Scoring 0  Opioid Risk Interpretation Low Risk       Designated Pharmacy- [facility]  Pain assessment: Cause of pain- DDD Pain location- low back pain Pain on scale of 1-10- 5-6/10 Frequency- daily 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  HENT: Negative.   Eyes: Negative for pain.  Respiratory: Negative for shortness of breath.   Cardiovascular: Negative for chest pain, palpitations and leg swelling.  Gastrointestinal: Negative for abdominal pain.  Endocrine: Negative for polydipsia.  Genitourinary: Negative.   Musculoskeletal: Positive for arthralgias and back pain.  Skin: Negative for rash.  Neurological: Negative for dizziness, weakness and headaches.  Hematological: Does not bruise/bleed easily.  Psychiatric/Behavioral: Negative.   All other systems reviewed and are negative.      Objective:   Physical Exam Vitals signs and nursing note reviewed.  Constitutional:  Appearance: Normal appearance.  Cardiovascular:     Rate and Rhythm: Normal rate and regular rhythm.     Heart sounds: Normal heart sounds.  Pulmonary:     Breath sounds: Normal breath sounds.  Musculoskeletal:     Comments: FROM of lumbar spine with pain on full flexion and extension (-) SLR bil Motor strength and sensation distally intact  Skin:    General: Skin is warm.  Neurological:     General: No focal deficit present.     Mental Status: He is alert and oriented to person, place, and time.  Psychiatric:        Mood and Affect: Mood normal.        Behavior: Behavior normal.     BP (!) 155/75   Pulse 70   Temp  97.8 F (36.6 C) (Oral)   Ht 5\' 10"  (1.778 m)   Wt 179 lb (81.2 kg)   BMI 25.68 kg/m        Assessment & Plan:  Thomas Medici. in today with chief complaint of Pain Management   1. Type 2 diabetes mellitus with stage 2 chronic kidney disease, with long-term current use of insulin (HCC) Watch carbs in diet - Insulin Syringes, Disposable, U-100 1 ML MISC; Use 4 times a day for insulin injection  Dispense: 400 each; Refill: 5  2. Chronic midline low back pain without sciatica Back stretches Follow up in 67months - HYDROcodone-acetaminophen (LORTAB) 5-325 MG tablet; Take 1 tablet by mouth 2 (two) times daily.  Dispense: 60 tablet; Refill: 0 - HYDROcodone-acetaminophen (LORTAB) 5-325 MG tablet; Take 1 tablet by mouth 2 (two) times daily.  Dispense: 60 tablet; Refill: 0 - HYDROcodone-acetaminophen (LORTAB) 5-325 MG tablet; Take 1 tablet by mouth 2 (two) times daily.  Dispense: 60 tablet; Refill: 0  [name] Hassell Done, FNP

## 2018-11-11 ENCOUNTER — Other Ambulatory Visit: Payer: Self-pay | Admitting: Nurse Practitioner

## 2018-11-11 DIAGNOSIS — J410 Simple chronic bronchitis: Secondary | ICD-10-CM

## 2018-11-17 ENCOUNTER — Other Ambulatory Visit: Payer: Self-pay | Admitting: Nurse Practitioner

## 2018-11-17 DIAGNOSIS — J069 Acute upper respiratory infection, unspecified: Secondary | ICD-10-CM

## 2018-11-22 ENCOUNTER — Other Ambulatory Visit: Payer: Self-pay | Admitting: Nurse Practitioner

## 2018-11-22 DIAGNOSIS — E1122 Type 2 diabetes mellitus with diabetic chronic kidney disease: Secondary | ICD-10-CM

## 2018-11-22 DIAGNOSIS — Z794 Long term (current) use of insulin: Secondary | ICD-10-CM

## 2018-12-06 ENCOUNTER — Ambulatory Visit: Payer: Self-pay | Admitting: Nurse Practitioner

## 2018-12-06 ENCOUNTER — Other Ambulatory Visit: Payer: Self-pay

## 2018-12-07 ENCOUNTER — Ambulatory Visit (INDEPENDENT_AMBULATORY_CARE_PROVIDER_SITE_OTHER): Payer: BC Managed Care – PPO | Admitting: Nurse Practitioner

## 2018-12-07 ENCOUNTER — Encounter: Payer: Self-pay | Admitting: Nurse Practitioner

## 2018-12-07 VITALS — BP 171/73 | HR 71 | Temp 98.9°F | Resp 20 | Ht 70.0 in | Wt 179.0 lb

## 2018-12-07 DIAGNOSIS — G8929 Other chronic pain: Secondary | ICD-10-CM

## 2018-12-07 NOTE — Progress Notes (Signed)
   Subjective:    Patient ID: Thomas Sandoval., male    DOB: 1955-12-21, 63 y.o.   MRN: [id number]   Chief Complaint: Pain   HPI Patient comes in thinking that he needs pain medication. He was seen on 10/18/18 and was given 3 separate hydrocodone rx. The last 2 to be filled on 11/18/18 and 12/18/18. He has not picked up either one. He did not know they were there. He would like to take 3x a day.   Review of Systems  Constitutional: Negative for activity change and appetite change.  HENT: Negative.   Eyes: Negative for pain.  Respiratory: Negative for shortness of breath.   Cardiovascular: Negative for chest pain, palpitations and leg swelling.  Gastrointestinal: Negative for abdominal pain.  Endocrine: Negative for polydipsia.  Genitourinary: Negative.   Musculoskeletal: Positive for arthralgias and back pain.  Skin: Negative for rash.  Neurological: Negative for dizziness, weakness and headaches.  Hematological: Does not bruise/bleed easily.  Psychiatric/Behavioral: Negative.   All other systems reviewed and are negative.      Objective:   Physical Exam Constitutional:      Appearance: Normal appearance.  Cardiovascular:     Rate and Rhythm: Normal rate and regular rhythm.     Pulses: Normal pulses.     Heart sounds: Normal heart sounds.  Pulmonary:     Effort: Pulmonary effort is normal.     Breath sounds: Normal breath sounds.  Neurological:     General: No focal deficit present.     Mental Status: He is alert and oriented to person, place, and time.  Psychiatric:        Mood and Affect: Mood normal.        Behavior: Behavior normal.     BP (!) 171/73   Pulse 71   Temp 98.9 F (37.2 C) (Oral)   Resp 20   Ht 5\' 10"  (1.778 m)   Wt 179 lb (81.2 kg)   SpO2 97%   BMI 25.68 kg/m        Assessment & Plan:  Thomas Sandoval. in today with chief complaint of Pain   1. Encounter for chronic pain management rx are at pharmacy to pick up Refuse to  give him pain medication TID Stop drinking alcohol- says he only drinks when he does not have pain meds to take. Last urine drug screen was negative.  [name] Hassell Done, FNP

## 2018-12-27 ENCOUNTER — Other Ambulatory Visit: Payer: Self-pay | Admitting: Nurse Practitioner

## 2018-12-27 DIAGNOSIS — E1122 Type 2 diabetes mellitus with diabetic chronic kidney disease: Secondary | ICD-10-CM

## 2018-12-27 MED ORDER — INSULIN SYRINGES (DISPOSABLE) U-100 1 ML MISC: 5 refills | Status: DC

## 2018-12-27 NOTE — Telephone Encounter (Signed)
LMOVM syringes have been sent to pharmacy

## 2019-01-19 ENCOUNTER — Ambulatory Visit (INDEPENDENT_AMBULATORY_CARE_PROVIDER_SITE_OTHER): Payer: BC Managed Care – PPO | Admitting: Nurse Practitioner

## 2019-01-19 ENCOUNTER — Encounter: Payer: Self-pay | Admitting: Nurse Practitioner

## 2019-01-19 ENCOUNTER — Other Ambulatory Visit: Payer: Self-pay

## 2019-01-19 VITALS — BP 143/81 | HR 93 | Temp 97.3°F | Ht 70.0 in | Wt 177.6 lb

## 2019-01-19 DIAGNOSIS — J418 Mixed simple and mucopurulent chronic bronchitis: Secondary | ICD-10-CM

## 2019-01-19 DIAGNOSIS — E1122 Type 2 diabetes mellitus with diabetic chronic kidney disease: Secondary | ICD-10-CM

## 2019-01-19 DIAGNOSIS — N182 Chronic kidney disease, stage 2 (mild): Secondary | ICD-10-CM

## 2019-01-19 DIAGNOSIS — I10 Essential (primary) hypertension: Secondary | ICD-10-CM

## 2019-01-19 DIAGNOSIS — E875 Hyperkalemia: Secondary | ICD-10-CM

## 2019-01-19 DIAGNOSIS — E119 Type 2 diabetes mellitus without complications: Secondary | ICD-10-CM | POA: Diagnosis not present

## 2019-01-19 DIAGNOSIS — G8929 Other chronic pain: Secondary | ICD-10-CM

## 2019-01-19 DIAGNOSIS — M545 Low back pain, unspecified: Secondary | ICD-10-CM

## 2019-01-19 DIAGNOSIS — K2100 Gastro-esophageal reflux disease with esophagitis, without bleeding: Secondary | ICD-10-CM

## 2019-01-19 DIAGNOSIS — N289 Disorder of kidney and ureter, unspecified: Secondary | ICD-10-CM

## 2019-01-19 DIAGNOSIS — I4891 Unspecified atrial fibrillation: Secondary | ICD-10-CM

## 2019-01-19 DIAGNOSIS — Z794 Long term (current) use of insulin: Secondary | ICD-10-CM

## 2019-01-19 LAB — BAYER DCA HB A1C WAIVED: HB A1C (BAYER DCA - WAIVED): 6.1 % (ref <7.0)

## 2019-01-19 MED ORDER — INSULIN ASPART 100 UNIT/ML ~~LOC~~ SOLN: SUBCUTANEOUS | 0 refills | Status: DC

## 2019-01-19 MED ORDER — HYDROCODONE-ACETAMINOPHEN 5-325 MG PO TABS: 1.0000 | ORAL_TABLET | Freq: Two times a day (BID) | ORAL | 0 refills | Status: AC

## 2019-01-19 MED ORDER — INSULIN GLARGINE 100 UNIT/ML ~~LOC~~ SOLN: SUBCUTANEOUS | 2 refills | Status: DC

## 2019-01-19 MED ORDER — LISINOPRIL 10 MG PO TABS: 10.0000 mg | ORAL_TABLET | Freq: Every day | ORAL | 3 refills | Status: DC

## 2019-01-19 MED ORDER — HYDROCODONE-ACETAMINOPHEN 5-325 MG PO TABS: 1.0000 | ORAL_TABLET | Freq: Four times a day (QID) | ORAL | 0 refills | Status: AC | PRN

## 2019-01-19 MED ORDER — FAMOTIDINE 20 MG PO TABS: 20.0000 mg | ORAL_TABLET | Freq: Every morning | ORAL | 5 refills | Status: DC

## 2019-01-19 NOTE — Patient Instructions (Signed)

## 2019-01-19 NOTE — Progress Notes (Signed)
Subjective:    Patient ID: Thomas Sandoval., male    DOB: [birth year], 64 y.o.   MRN: [id number]   Chief Complaint: medical management of chronic issues   HPI:  1. Essential hypertension No c/o chest pan, sob or headache. Does not check blood pressure at home. Blood pressure is always high when he comes in. BP Readings from Last 3 Encounters:  12/07/18 (!) 171/73  10/18/18 (!) 155/75  09/16/18 (!) 160/86     2. Mixed simple and mucopurulent chronic bronchitis ([place]) He is suppose to be on trelogy from pulmonologist. He says he rotates between trelogy and advair but does nt use them both. He refuses to quit smokimg.  3. Gastroesophageal reflux disease with esophagitis without hemorrhage Is on daily pepcid. Says works well for him  4. Type 2 diabetes mellitus without complication, with long-term current use of insulin ([place]) He does no check her blood sugar at home. Does not watch diet and stays busy. Lab Results  Component Value Date   HGBA1C 6.5 09/16/2018     5. Mild renal insufficiency Denies any trouble voiding Lab Results  Component Value Date   CREATININE 1.05 09/16/2018     6. Hyperkalemia Denies any lower ext cramping Lab Results  Component Value Date   K 4.7 09/16/2018     7. Atrial fibrillation with rapid ventricular response (HCC) Denies heart racing  8. Chronic midline low back pain without sciatica Pain assessment: Cause of pain- DDD Pain location- low back pain Pain on scale of 1-10- 6/10 currently Frequency- daioly What increases pain-to much work What makes pain Better-nothing really- pain meds help Effects on ADL - none Any change in general medical condition-he does what he has to  Current opioids rx- lortab 5/325 BID # meds rx- #60 Effectiveness of current meds-helps bring pain down to 2-3/10 Adverse reactions form pain meds-none Morphine equivalent- 20MEDD  Pill count performed-No Last drug screen - 09/16/18 ( high risk q43m  moderate risk q672mlow risk yearly ) Urine drug screen today- No Was the [place]- yes  If yes were their any concerning findings? - no  Opioid Risk  09/16/2018  Alcohol 0  Illegal Drugs 0  Rx Drugs 0  Alcohol 0  Illegal Drugs 0  Rx Drugs 0  Age between 16-45 years  0  History of Preadolescent Sexual Abuse 0  Psychological Disease 0  Depression 0  Opioid Risk Tool Scoring 0  Opioid Risk Interpretation Low Risk     Pain contract signed on: 09/20/18     Outpatient Encounter Medications as of 01/19/2019  Medication Sig  . albuterol (PROVENTIL HFA;VENTOLIN HFA) 108 (90 Base) MCG/ACT inhaler Inhale 2 puffs every 6 (six) hours as needed into the lungs for wheezing or shortness of breath.  . Marland Kitchenspirin 81 MG tablet Take 81 mg by mouth daily.  . famotidine (PEPCID) 20 MG tablet Take 1 tablet (20 mg total) by mouth every morning. Reported on 04/16/2015  . fluticasone (FLONASE) 50 MCG/ACT nasal spray SPRAY 2 SPRAYS INTO EACH NOSTRIL EVERY DAY  . Fluticasone-Salmeterol (ADVAIR DISKUS) 250-50 MCG/DOSE AEPB INHALE 1 PUFF INTO THE LUNGS EVERY EVENING.  . Marland Kitchenluticasone-Umeclidin-Vilant (TRELEGY ELLIPTA) 100-62.5-25 MCG/INH AEPB Inhale 2 puffs into the lungs daily.  . insulin aspart (NOVOLOG) 100 UNIT/ML injection PER SLIDING SCALE: 190 - 200 = 2 UNITS 300 AND ABOVE = 7 UNITS  . insulin glargine (LANTUS) 100 UNIT/ML injection INJECT 40 UNITS SUBCUTANEOUSLY EVERY MORNING  . Insulin Syringes, Disposable, U-100  1 ML MISC Use 4 times a day for insulin injection Dx E11.9  . lisinopril (ZESTRIL) 5 MG tablet Take 1 tablet (5 mg total) by mouth daily.  Marland Kitchen loratadine (CLARITIN) 10 MG tablet Take 10 mg by mouth at bedtime.       Past Surgical History:  Procedure Laterality Date  . CYSTOSCOPY WITH URETEROSCOPY Right 08/14/2013   Procedure: CYSTOSCOPY WITH URETEROSCOPY BLADDER BIOPSY ;  Surgeon: Claybon Jabs, MD;  Location: [facility];  Service: Urology;  Laterality: Right;  .  ESOPHAGOGASTRODUODENOSCOPY N/A 02/06/2013   Procedure: ESOPHAGOGASTRODUODENOSCOPY (EGD);  Surgeon: Lafayette Dragon, MD;  Location: [facility] ENDOSCOPY;  Service: Endoscopy;  Laterality: N/A;  . LAPAROSCOPY N/A 11/25/2014   Procedure: LAPAROSCOPIC PRIMARY REPAIR OF PERFORATED PREPYLORIC ULCER WITH Silvestre Gunner;  Surgeon: Greer Pickerel, MD;  Location: [place];  Service: General;  Laterality: N/A;  . TONSILLECTOMY  as child  . TRANSTHORACIC ECHOCARDIOGRAM  02-17-2011   MODERATE LVH/  EF 65%  . TRANSURETHRAL RESECTION OF BLADDER TUMOR WITH GYRUS (TURBT-GYRUS) N/A 06/12/2013   Procedure: TRANSURETHRAL RESECTION OF BLADDER TUMOR WITH GYRUS (TURBT-GYRUS);  Surgeon: Claybon Jabs, MD;  Location: [facility];  Service: Urology;  Laterality: N/A;  . TYMPANIC MEMBRANE REPAIR  as child    Family History  Problem Relation Age of Onset  . Breast cancer Mother   . Cancer Mother        Breast  . Rheumatic fever Father   . Heart disease Father   . Heart attack Father        Massive   . 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  HENT: Negative.   Eyes: Negative for pain.  Respiratory: Negative for shortness of breath.   Cardiovascular: Negative for chest pain, palpitations and leg swelling.  Gastrointestinal: Negative for abdominal pain.  Endocrine: Negative for polydipsia.  Genitourinary: Negative.   Musculoskeletal: Positive for myalgias (left upper thigh).  Skin: Negative for rash.  Neurological: Negative for dizziness, weakness and headaches.  Hematological: Does not bruise/bleed easily.  Psychiatric/Behavioral: Negative.   All other systems reviewed and are negative.      Objective:   Physical Exam Vitals signs and nursing note reviewed.  Constitutional:      Appearance: Normal  appearance. He is well-developed.  HENT:     Head: Normocephalic.     Nose: Nose normal.  Eyes:     Pupils: Pupils are equal, round, and reactive to light.  Neck:     Musculoskeletal: Normal range of motion and neck supple.     Thyroid: No thyroid mass or thyromegaly.     Vascular: No carotid bruit or JVD.     Trachea: Phonation normal.  Cardiovascular:     Rate and Rhythm: Normal rate and regular rhythm.  Pulmonary:     Effort: Pulmonary effort is normal. No respiratory distress.     Breath sounds: Normal breath sounds.  Abdominal:     General: Bowel sounds are normal.     Palpations: Abdomen is soft.     Tenderness: There is no abdominal tenderness.  Musculoskeletal: Normal range of motion.  Lymphadenopathy:     Cervical: No cervical adenopathy.  Skin:    General: Skin is warm and dry.  Neurological:  Mental Status: He is alert and oriented to person, place, and time.  Psychiatric:        Behavior: Behavior normal.        Thought Content: Thought content normal.        Judgment: Judgment normal.    hgba1c 6.1   BP (!) 143/81   Pulse 93   Temp (!) 97.3 F (36.3 C) (Temporal)   Ht _0  (1.778 m)   Wt 177 lb 9.6 oz (80.6 kg)   SpO2 96%   BMI 25.48 kg/m      Assessment & Plan:  Oiva Dibari. comes in today with chief complaint of Medical Management of Chronic Issues (left leg pain)   Diagnosis and orders addressed:  1. Essential hypertension Low sodium diet Increased linopril to 31m daily - CMP14+EGFR - Lipid panel  2. Mixed simple and mucopurulent chronic bronchitis (HCC) Need to stay on trelogy daily  3. Gastroesophageal reflux disease with esophagitis without hemorrhage Avoid spicy foods Do not eat 2 hours prior to bedtime  4.  Type 2 diabetes mellitus with stage 2 chronic kidney disease, with long-term current use of insulin (HCC)* - insulin glargine (LANTUS) 100 UNIT/ML injection; INJECT 40 UNITS SUBCUTANEOUSLY EVERY MORNING   Dispense: 10 mL; Refill: 2 - insulin aspart (NOVOLOG) 100 UNIT/ML injection; PER SLIDING SCALE: 190 - 200 = 2 UNITS 300 AND ABOVE = 7 UNITS  Dispense: 30 mL; Refill: 0  Continue to watch carbs in diet - hgba1c  5. Mild renal insufficiency Labs pending  6. Hyperkalemia Labs pending  7. Atrial fibrillation with rapid ventricular response (HCC) Avoid caffeine  8. Chronic midline low back pain without sciatica Moist heat' rest - HYDROcodone-acetaminophen (LORTAB) 5-325 MG tablet; Take 1 tablet by mouth 2 (two) times daily.  Dispense: 60 tablet; Refill: 0 - HYDROcodone-acetaminophen (LORTAB) 5-325 MG tablet; Take 1 tablet by mouth every 6 (six) hours as needed for moderate pain.  Dispense: 60 tablet; Refill: 0 - HYDROcodone-acetaminophen (LORTAB) 5-325 MG tablet; Take 1 tablet by mouth every 6 (six) hours as needed for moderate pain.  Dispense: 60 tablet; Refill: 0  9. Gastroesophageal reflux disease with esophagitis Avoid spicy foods Do not eat 2 hours prior to bedtime - famotidine (PEPCID) 20 MG tablet; Take 1 tablet (20 mg total) by mouth every morning. Reported on 04/16/2015  Dispense: 30 tablet; Refill: 5   Labs pending Health Maintenance reviewed Diet and exercise encouraged  Follow up plan: *3 months   [name] MHassell Done FNP

## 2019-01-20 LAB — LIPID PANEL
Chol/HDL Ratio: 3 ratio (ref 0.0–5.0)
Cholesterol, Total: 134 mg/dL (ref 100–199)
HDL: 45 mg/dL (ref >39)
LDL Chol Calc (NIH): 72 mg/dL (ref 0–99)
Triglycerides: 91 mg/dL (ref 0–149)
VLDL Cholesterol Cal: 17 mg/dL (ref 5–40)

## 2019-01-20 LAB — CMP14+EGFR
ALT: 13 IU/L (ref 0–44)
AST: 20 IU/L (ref 0–40)
Albumin/Globulin Ratio: 2.1 (ref 1.2–2.2)
Albumin: 4.4 g/dL (ref 3.8–4.8)
Alkaline Phosphatase: 79 IU/L (ref 39–117)
BUN/Creatinine Ratio: 9 — ABNORMAL LOW (ref 10–24)
BUN: 10 mg/dL (ref 8–27)
Bilirubin Total: 0.8 mg/dL (ref 0.0–1.2)
CO2: 23 mmol/L (ref 20–29)
Calcium: 9.6 mg/dL (ref 8.6–10.2)
Chloride: 100 mmol/L (ref 96–106)
Creatinine, Ser: 1.12 mg/dL (ref 0.76–1.27)
GFR calc Af Amer: 82 mL/min/{1.73_m2} (ref >59)
GFR calc non Af Amer: 71 mL/min/{1.73_m2} (ref >59)
Globulin, Total: 2.1 g/dL (ref 1.5–4.5)
Glucose: 75 mg/dL (ref 65–99)
Potassium: 4.3 mmol/L (ref 3.5–5.2)
Sodium: 138 mmol/L (ref 134–144)
Total Protein: 6.5 g/dL (ref 6.0–8.5)

## 2019-02-24 ENCOUNTER — Other Ambulatory Visit: Payer: Self-pay | Admitting: Nurse Practitioner

## 2019-02-24 DIAGNOSIS — J410 Simple chronic bronchitis: Secondary | ICD-10-CM

## 2019-03-08 ENCOUNTER — Other Ambulatory Visit: Payer: Self-pay | Admitting: Nurse Practitioner

## 2019-03-08 DIAGNOSIS — J410 Simple chronic bronchitis: Secondary | ICD-10-CM

## 2019-03-08 DIAGNOSIS — E1122 Type 2 diabetes mellitus with diabetic chronic kidney disease: Secondary | ICD-10-CM

## 2019-04-02 ENCOUNTER — Other Ambulatory Visit: Payer: Self-pay | Admitting: Nurse Practitioner

## 2019-04-02 DIAGNOSIS — J418 Mixed simple and mucopurulent chronic bronchitis: Secondary | ICD-10-CM

## 2019-04-15 ENCOUNTER — Other Ambulatory Visit: Payer: Self-pay | Admitting: Nurse Practitioner

## 2019-04-15 DIAGNOSIS — E1122 Type 2 diabetes mellitus with diabetic chronic kidney disease: Secondary | ICD-10-CM

## 2019-04-21 ENCOUNTER — Telehealth: Payer: Self-pay | Admitting: Nurse Practitioner

## 2019-04-21 ENCOUNTER — Ambulatory Visit: Payer: Self-pay | Admitting: Nurse Practitioner

## 2019-05-02 ENCOUNTER — Ambulatory Visit: Payer: 59 | Admitting: Nurse Practitioner

## 2019-05-02 ENCOUNTER — Encounter: Payer: Self-pay | Admitting: Nurse Practitioner

## 2019-05-02 ENCOUNTER — Other Ambulatory Visit: Payer: Self-pay

## 2019-05-02 VITALS — Temp 98.6°F | Resp 20 | Ht 70.0 in | Wt 182.0 lb

## 2019-05-02 DIAGNOSIS — N182 Chronic kidney disease, stage 2 (mild): Secondary | ICD-10-CM

## 2019-05-02 DIAGNOSIS — J410 Simple chronic bronchitis: Secondary | ICD-10-CM

## 2019-05-02 DIAGNOSIS — M549 Dorsalgia, unspecified: Secondary | ICD-10-CM | POA: Insufficient documentation

## 2019-05-02 DIAGNOSIS — J418 Mixed simple and mucopurulent chronic bronchitis: Secondary | ICD-10-CM | POA: Diagnosis not present

## 2019-05-02 DIAGNOSIS — I4891 Unspecified atrial fibrillation: Secondary | ICD-10-CM

## 2019-05-02 DIAGNOSIS — I10 Essential (primary) hypertension: Secondary | ICD-10-CM | POA: Diagnosis not present

## 2019-05-02 DIAGNOSIS — E1122 Type 2 diabetes mellitus with diabetic chronic kidney disease: Secondary | ICD-10-CM

## 2019-05-02 DIAGNOSIS — K2100 Gastro-esophageal reflux disease with esophagitis, without bleeding: Secondary | ICD-10-CM

## 2019-05-02 DIAGNOSIS — M545 Low back pain: Secondary | ICD-10-CM

## 2019-05-02 DIAGNOSIS — G8929 Other chronic pain: Secondary | ICD-10-CM | POA: Insufficient documentation

## 2019-05-02 DIAGNOSIS — E875 Hyperkalemia: Secondary | ICD-10-CM

## 2019-05-02 DIAGNOSIS — Z794 Long term (current) use of insulin: Secondary | ICD-10-CM

## 2019-05-02 DIAGNOSIS — E119 Type 2 diabetes mellitus without complications: Secondary | ICD-10-CM

## 2019-05-02 LAB — BAYER DCA HB A1C WAIVED: HB A1C (BAYER DCA - WAIVED): 7.1 % — ABNORMAL HIGH (ref <7.0)

## 2019-05-02 MED ORDER — FLUTICASONE-SALMETEROL 250-50 MCG/DOSE IN AEPB: INHALATION_SPRAY | RESPIRATORY_TRACT | 1 refills | Status: DC

## 2019-05-02 MED ORDER — LISINOPRIL 10 MG PO TABS: 10.0000 mg | ORAL_TABLET | Freq: Every day | ORAL | 3 refills | Status: DC

## 2019-05-02 MED ORDER — TRELEGY ELLIPTA 100-62.5-25 MCG/INH IN AEPB: 2.0000 | INHALATION_SPRAY | Freq: Every day | RESPIRATORY_TRACT | 2 refills | Status: DC

## 2019-05-02 MED ORDER — FAMOTIDINE 20 MG PO TABS: 20.0000 mg | ORAL_TABLET | Freq: Every morning | ORAL | 5 refills | Status: DC

## 2019-05-02 MED ORDER — HYDROCODONE-ACETAMINOPHEN 5-325 MG PO TABS: 1.0000 | ORAL_TABLET | Freq: Two times a day (BID) | ORAL | 0 refills | Status: DC

## 2019-05-02 MED ORDER — INSULIN GLARGINE 100 UNIT/ML ~~LOC~~ SOLN: SUBCUTANEOUS | 2 refills | Status: DC

## 2019-05-02 MED ORDER — INSULIN ASPART 100 UNIT/ML ~~LOC~~ SOLN: SUBCUTANEOUS | 0 refills | Status: DC

## 2019-05-02 MED ORDER — HYDROCODONE-ACETAMINOPHEN 5-325 MG PO TABS: 1.0000 | ORAL_TABLET | Freq: Two times a day (BID) | ORAL | 0 refills | Status: AC

## 2019-05-02 NOTE — Progress Notes (Signed)
Subjective:    Patient ID: Thomas Medici., male    DOB: 08/14/1955, 64 y.o.   MRN: [id number]   Chief Complaint: Medical Management of Chronic Issues    HPI:  1. Essential hypertension No c/o chest pain, sob or headache. Does not check blood pressure at home.  BP Readings from Last 3 Encounters:  01/19/19 (!) 143/81  12/07/18 (!) 171/73  10/18/18 (!) 155/75     2. Atrial fibrillation with rapid ventricular response (HCC) Denies any palpitations. He denies SOB.  3. Mixed simple and mucopurulent chronic bronchitis (HCC) Still smoking and stays sob and coughing. He is on advair daily.  4. Gastroesophageal reflux disease with esophagitis without hemorrhage Takes pepcid daily which works well most of the time.  5. Type 2 diabetes mellitus without complication, with long-term current use of insulin (HCC) bloos sugars range from 60-130. He has occasional low blood sugars. Does not watch diet and does very little exercise.  6. Hyperkalemia No c/o of lower ext cramping  7. Chronic back pain Is on hydrocodone 5/325 BID. 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 Last toxasure was done 09/16/18   Outpatient Encounter Medications as of 05/02/2019  Medication Sig  . albuterol (PROVENTIL HFA;VENTOLIN HFA) 108 (90 Base) MCG/ACT inhaler Inhale 2 puffs every 6 (six) hours as needed into the lungs for wheezing or shortness of breath.  Marland Kitchen aspirin 81 MG tablet Take 81 mg by mouth daily.  . famotidine (PEPCID) 20 MG tablet Take 1 tablet (20 mg total) by mouth every morning. Reported on 04/16/2015  . fluticasone (FLONASE) 50 MCG/ACT nasal spray SPRAY 2 SPRAYS INTO EACH NOSTRIL EVERY DAY  . Fluticasone-Salmeterol (ADVAIR DISKUS) 250-50 MCG/DOSE AEPB INHALE 1 PUFF INTO THE LUNGS EVERY EVENING.  . insulin aspart (NOVOLOG) 100 UNIT/ML injection PER SLIDING SCALE: 190 - 200 = 2 UNITS 300 AND ABOVE = 7 UNITS  . insulin glargine (LANTUS) 100 UNIT/ML injection INJECT 40 UNITS  SUBCUTANEOUSLY EVERY MORNING  . Insulin Syringes, Disposable, U-100 1 ML MISC Use 4 times a day for insulin injection Dx E11.9  . lisinopril (ZESTRIL) 10 MG tablet Take 1 tablet (10 mg total) by mouth daily.  Marland Kitchen loratadine (CLARITIN) 10 MG tablet Take 10 mg by mouth at bedtime.    . TRELEGY ELLIPTA 100-62.5-25 MCG/INH AEPB INHALE 2 PUFFS BY MOUTH INTO THE LUNGS DAILY     Past Surgical History:  Procedure Laterality Date  . CYSTOSCOPY WITH URETEROSCOPY Right 08/14/2013   Procedure: CYSTOSCOPY WITH URETEROSCOPY BLADDER BIOPSY ;  Surgeon: Claybon Jabs, MD;  Location: [facility];  Service: Urology;  Laterality: Right;  . ESOPHAGOGASTRODUODENOSCOPY N/A 02/06/2013   Procedure: ESOPHAGOGASTRODUODENOSCOPY (EGD);  Surgeon: Lafayette Dragon, MD;  Location: [facility] ENDOSCOPY;  Service: Endoscopy;  Laterality: N/A;  . LAPAROSCOPY N/A 11/25/2014   Procedure: LAPAROSCOPIC PRIMARY REPAIR OF PERFORATED PREPYLORIC ULCER WITH Silvestre Gunner;  Surgeon: Greer Pickerel, MD;  Location: [place];  Service: General;  Laterality: N/A;  . TONSILLECTOMY  as child  . TRANSTHORACIC ECHOCARDIOGRAM  02-17-2011   MODERATE LVH/  EF 65%  . TRANSURETHRAL RESECTION OF BLADDER TUMOR WITH GYRUS (TURBT-GYRUS) N/A 06/12/2013   Procedure: TRANSURETHRAL RESECTION OF BLADDER TUMOR WITH GYRUS (TURBT-GYRUS);  Surgeon: Claybon Jabs, MD;  Location: [facility];  Service: Urology;  Laterality: N/A;  . TYMPANIC MEMBRANE REPAIR  as child    Family History  Problem Relation Age of Onset  . Breast cancer Mother   .  Cancer Mother        Breast  . Rheumatic fever Father   . Heart disease Father   . Heart attack Father        Massive   . 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  Controlled substance contract: 09/20/18    Review of Systems  Constitutional: Negative for diaphoresis.  Eyes: Negative for pain.  Respiratory: Negative for shortness of breath.    Cardiovascular: Negative for chest pain, palpitations and leg swelling.  Gastrointestinal: Negative for abdominal pain.  Endocrine: Negative for polydipsia.  Skin: Negative for rash.  Neurological: Negative for dizziness, weakness and headaches.  Hematological: Does not bruise/bleed easily.  All other systems reviewed and are negative.      Objective:   Physical Exam Vitals and nursing note reviewed.  Constitutional:      Appearance: Normal appearance. He is well-developed.  HENT:     Head: Normocephalic.     Right Ear: Tympanic membrane normal. Decreased hearing noted.     Left Ear: Tympanic membrane normal. Decreased hearing noted.     Ears:     Comments: Talks very loud    Nose: Nose normal.  Eyes:     Pupils: Pupils are equal, round, and reactive to light.  Neck:     Thyroid: No thyroid mass or thyromegaly.     Vascular: No carotid bruit or JVD.     Trachea: Phonation normal.  Cardiovascular:     Rate and Rhythm: Normal rate and regular rhythm.  Pulmonary:     Effort: Pulmonary effort is normal. No respiratory distress.     Breath sounds: Normal breath sounds.  Abdominal:     General: Bowel sounds are normal.     Palpations: Abdomen is soft.     Tenderness: There is no abdominal tenderness.  Musculoskeletal:        General: Normal range of motion.     Cervical back: Normal range of motion and neck supple.  Lymphadenopathy:     Cervical: No cervical adenopathy.  Skin:    General: Skin is warm and dry.  Neurological:     Mental Status: He is alert and oriented to person, place, and time.  Psychiatric:        Behavior: Behavior normal.        Thought Content: Thought content normal.        Judgment: Judgment normal.    Temp 98.6 F (37 C)   Resp 20   Ht 5\' 10"  (1.778 m)   Wt 182 lb (82.6 kg)   BMI 26.11 kg/m         Assessment & Plan:  Thomas Medici. comes in today with chief complaint of Medical Management of Chronic Issues   Diagnosis  and orders addressed:  1. Essential hypertension Low sodium diet  2. Atrial fibrillation with rapid ventricular response (HCC) Avoid caffeine  3. Mixed simple and mucopurulent chronic bronchitis (HCC) Try to decrease cigarette smoke - Fluticasone-Umeclidin-Vilant (TRELEGY ELLIPTA) 100-62.5-25 MCG/INH AEPB; Inhale 2 puffs into the lungs daily.  Dispense: 60 each; Refill: 2 - Fluticasone-Salmeterol (ADVAIR DISKUS) 250-50 MCG/DOSE AEPB; INHALE 1 PUFF INTO THE LUNGS EVERY EVENING.  Dispense: 180 each; Refill: 1  4. Gastroesophageal reflux disease with esophagitis without hemorrhage Avoid spicy foods Do not eat 2 hours prior to bedtime - famotidine (PEPCID) 20 MG tablet; Take 1 tablet (20 mg total) by mouth every morning. Reported  on 04/16/2015  Dispense: 30 tablet; Refill: 5   5. Type 2 diabetes mellitus without complication, with long-term current use of insulin (HCC) Low carb diet  insulin glargine (LANTUS) 100 UNIT/ML injection; INJECT 40 UNITS SUBCUTANEOUSLY EVERY MORNING  Dispense: 10 mL; Refill: 2 - insulin aspart (NOVOLOG) 100 UNIT/ML injection; PER SLIDING SCALE: 190 - 200 = 2 UNITS 300 AND ABOVE = 7 UNITS  Dispense: 30 mL; Refill: 0   6. Hyperkalemia   Labs pending Health Maintenance reviewed Diet and exercise encouraged  Follow up plan: 3 months   [name] Hassell Done, FNP

## 2019-05-02 NOTE — Addendum Note (Signed)
Addended by: Zannie Cove on: 05/02/2019 03:12 PM   Modules accepted: Orders

## 2019-05-03 LAB — CBC WITH DIFFERENTIAL/PLATELET
Basophils Absolute: 0.1 10*3/uL (ref 0.0–0.2)
Basos: 1 %
EOS (ABSOLUTE): 0.1 10*3/uL (ref 0.0–0.4)
Eos: 2 %
Hematocrit: 44.1 % (ref 37.5–51.0)
Hemoglobin: 15.9 g/dL (ref 13.0–17.7)
Immature Grans (Abs): 0.1 10*3/uL (ref 0.0–0.1)
Immature Granulocytes: 1 %
Lymphocytes Absolute: 1 10*3/uL (ref 0.7–3.1)
Lymphs: 12 %
MCH: 35 pg — ABNORMAL HIGH (ref 26.6–33.0)
MCHC: 36.1 g/dL — ABNORMAL HIGH (ref 31.5–35.7)
MCV: 97 fL (ref 79–97)
Monocytes Absolute: 0.6 10*3/uL (ref 0.1–0.9)
Monocytes: 8 %
Neutrophils Absolute: 6.5 10*3/uL (ref 1.4–7.0)
Neutrophils: 76 %
Platelets: 288 10*3/uL (ref 150–450)
RBC: 4.54 x10E6/uL (ref 4.14–5.80)
RDW: 12.8 % (ref 11.6–15.4)
WBC: 8.4 10*3/uL (ref 3.4–10.8)

## 2019-05-03 LAB — LIPID PANEL
Chol/HDL Ratio: 2.8 ratio (ref 0.0–5.0)
Cholesterol, Total: 150 mg/dL (ref 100–199)
HDL: 54 mg/dL (ref >39)
LDL Chol Calc (NIH): 77 mg/dL (ref 0–99)
Triglycerides: 104 mg/dL (ref 0–149)
VLDL Cholesterol Cal: 19 mg/dL (ref 5–40)

## 2019-05-03 LAB — CMP14+EGFR
ALT: 11 IU/L (ref 0–44)
AST: 23 IU/L (ref 0–40)
Albumin/Globulin Ratio: 2 (ref 1.2–2.2)
Albumin: 4.6 g/dL (ref 3.8–4.8)
Alkaline Phosphatase: 77 IU/L (ref 39–117)
BUN/Creatinine Ratio: 9 — ABNORMAL LOW (ref 10–24)
BUN: 11 mg/dL (ref 8–27)
Bilirubin Total: 0.6 mg/dL (ref 0.0–1.2)
CO2: 21 mmol/L (ref 20–29)
Calcium: 9.7 mg/dL (ref 8.6–10.2)
Chloride: 98 mmol/L (ref 96–106)
Creatinine, Ser: 1.24 mg/dL (ref 0.76–1.27)
GFR calc Af Amer: 72 mL/min/{1.73_m2} (ref >59)
GFR calc non Af Amer: 62 mL/min/{1.73_m2} (ref >59)
Globulin, Total: 2.3 g/dL (ref 1.5–4.5)
Glucose: 96 mg/dL (ref 65–99)
Potassium: 4.6 mmol/L (ref 3.5–5.2)
Sodium: 135 mmol/L (ref 134–144)
Total Protein: 6.9 g/dL (ref 6.0–8.5)

## 2019-05-11 ENCOUNTER — Telehealth: Payer: Self-pay | Admitting: Nurse Practitioner

## 2019-05-11 NOTE — Telephone Encounter (Signed)
Attempted to contact patient - NA °

## 2019-05-16 NOTE — Telephone Encounter (Signed)
PATIENT STATES HE IS FEELING BETTER. THINKS HE MAY HAVE HAD A VIRUS.

## 2019-06-15 ENCOUNTER — Ambulatory Visit (INDEPENDENT_AMBULATORY_CARE_PROVIDER_SITE_OTHER): Payer: 59 | Admitting: Nurse Practitioner

## 2019-06-15 ENCOUNTER — Encounter: Payer: Self-pay | Admitting: Nurse Practitioner

## 2019-06-15 DIAGNOSIS — K219 Gastro-esophageal reflux disease without esophagitis: Secondary | ICD-10-CM | POA: Diagnosis not present

## 2019-06-15 MED ORDER — PANTOPRAZOLE SODIUM 40 MG PO TBEC: 40.0000 mg | DELAYED_RELEASE_TABLET | Freq: Every day | ORAL | 3 refills | Status: DC

## 2019-06-15 NOTE — Progress Notes (Signed)
   Virtual Visit via telephone Note Due to COVID-19 pandemic this visit was conducted virtually. This visit type was conducted due to national recommendations for restrictions regarding the COVID-19 Pandemic (e.g. social distancing, sheltering in place) in an effort to limit this patient's exposure and mitigate transmission in our community. All issues noted in this document were discussed and addressed.  A physical exam was not performed with this format.  I connected with Thomas Medici. on 06/15/19 at 10:20 by telephone and verified that I am speaking with the correct person using two identifiers. Thomas Medici. is currently located at home and his wife is currently with him during visit. The provider, [name] Hassell Done, FNP is located in their office at time of visit.  I discussed the limitations, risks, security and privacy concerns of performing an evaluation and management service by telephone and the availability of in person appointments. I also discussed with the patient that there may be a patient responsible charge related to this service. The patient expressed understanding and agreed to proceed.   History and Present Illness:   Chief Complaint: Gastroesophageal Reflux   Patient calls in c/o gastric reflux. He has had this for about 2 weeks. He eats a lot of spicy and fried stuff. He is not able to lay down t night and sleep because food comes back up in throat. If he sleeps in a chair he is abel to sleep all night. Tums OTC as helped   Review of Systems  Constitutional: Negative for diaphoresis and weight loss.  Eyes: Negative for blurred vision, double vision and pain.  Respiratory: Negative for shortness of breath.   Cardiovascular: Negative for chest pain, palpitations, orthopnea and leg swelling.  Gastrointestinal: Positive for heartburn and nausea. Negative for abdominal pain, constipation and diarrhea.  Skin: Negative for rash.  Neurological: Negative for  dizziness, sensory change, loss of consciousness, weakness and headaches.  Endo/Heme/Allergies: Negative for polydipsia. Does not bruise/bleed easily.  Psychiatric/Behavioral: Negative for memory loss. The patient does not have insomnia.   All other systems reviewed and are negative.    Observations/Objective: Alert and oriented- answers all questions appropriately No distress    Assessment and Plan: Thomas Medici. in today with chief complaint of Gastroesophageal Reflux   1. Gastroesophageal reflux disease without esophagitis Avoid spicy foods Do not eat 2 hours prior to bedtime - pantoprazole (PROTONIX) 40 MG tablet; Take 1 tablet (40 mg total) by mouth daily.  Dispense: 30 tablet; Refill: 3    Follow Up Instructions: prn    I discussed the assessment and treatment plan with the patient. The patient was provided an opportunity to ask questions and all were answered. The patient agreed with the plan and demonstrated an understanding of the instructions.   The patient was advised to call back or seek an in-person evaluation if the symptoms worsen or if the condition fails to improve as anticipated.  The above assessment and management plan was discussed with the patient. The patient verbalized understanding of and has agreed to the management plan. Patient is aware to call the clinic if symptoms persist or worsen. Patient is aware when to return to the clinic for a follow-up visit. Patient educated on when it is appropriate to go to the emergency department.   Time call ended:  10:36 I provided 16 minutes of non-face-to-face time during this encounter.    [name] Hassell Done, FNP

## 2019-07-26 ENCOUNTER — Ambulatory Visit: Payer: 59 | Admitting: Nurse Practitioner

## 2019-07-26 ENCOUNTER — Other Ambulatory Visit: Payer: Self-pay

## 2019-07-26 ENCOUNTER — Encounter: Payer: Self-pay | Admitting: Nurse Practitioner

## 2019-07-26 VITALS — BP 139/70 | HR 87 | Temp 98.3°F | Resp 20 | Ht 70.0 in | Wt 180.0 lb

## 2019-07-26 DIAGNOSIS — E119 Type 2 diabetes mellitus without complications: Secondary | ICD-10-CM

## 2019-07-26 DIAGNOSIS — J418 Mixed simple and mucopurulent chronic bronchitis: Secondary | ICD-10-CM

## 2019-07-26 DIAGNOSIS — I4891 Unspecified atrial fibrillation: Secondary | ICD-10-CM

## 2019-07-26 DIAGNOSIS — H9190 Unspecified hearing loss, unspecified ear: Secondary | ICD-10-CM | POA: Insufficient documentation

## 2019-07-26 DIAGNOSIS — Z125 Encounter for screening for malignant neoplasm of prostate: Secondary | ICD-10-CM

## 2019-07-26 DIAGNOSIS — E875 Hyperkalemia: Secondary | ICD-10-CM

## 2019-07-26 DIAGNOSIS — I10 Essential (primary) hypertension: Secondary | ICD-10-CM | POA: Diagnosis not present

## 2019-07-26 DIAGNOSIS — H9193 Unspecified hearing loss, bilateral: Secondary | ICD-10-CM

## 2019-07-26 DIAGNOSIS — G8929 Other chronic pain: Secondary | ICD-10-CM

## 2019-07-26 DIAGNOSIS — K2100 Gastro-esophageal reflux disease with esophagitis, without bleeding: Secondary | ICD-10-CM

## 2019-07-26 DIAGNOSIS — N289 Disorder of kidney and ureter, unspecified: Secondary | ICD-10-CM

## 2019-07-26 DIAGNOSIS — M545 Low back pain: Secondary | ICD-10-CM

## 2019-07-26 DIAGNOSIS — E785 Hyperlipidemia, unspecified: Secondary | ICD-10-CM

## 2019-07-26 DIAGNOSIS — Z794 Long term (current) use of insulin: Secondary | ICD-10-CM

## 2019-07-26 LAB — BAYER DCA HB A1C WAIVED: HB A1C (BAYER DCA - WAIVED): 6.6 % (ref <7.0)

## 2019-07-26 MED ORDER — INSULIN ASPART 100 UNIT/ML ~~LOC~~ SOLN: SUBCUTANEOUS | 0 refills | Status: DC

## 2019-07-26 MED ORDER — FAMOTIDINE 20 MG PO TABS: 20.0000 mg | ORAL_TABLET | Freq: Every morning | ORAL | 5 refills | Status: DC

## 2019-07-26 MED ORDER — INSULIN GLARGINE 100 UNIT/ML ~~LOC~~ SOLN: SUBCUTANEOUS | 2 refills | Status: DC

## 2019-07-26 MED ORDER — FLUTICASONE-SALMETEROL 250-50 MCG/DOSE IN AEPB: INHALATION_SPRAY | RESPIRATORY_TRACT | 1 refills | Status: DC

## 2019-07-26 MED ORDER — TRELEGY ELLIPTA 100-62.5-25 MCG/INH IN AEPB: 2.0000 | INHALATION_SPRAY | Freq: Every day | RESPIRATORY_TRACT | 2 refills | Status: DC

## 2019-07-26 MED ORDER — HYDROCODONE-ACETAMINOPHEN 5-325 MG PO TABS: 1.0000 | ORAL_TABLET | Freq: Two times a day (BID) | ORAL | 0 refills | Status: AC

## 2019-07-26 MED ORDER — HYDROCODONE-ACETAMINOPHEN 5-325 MG PO TABS: 1.0000 | ORAL_TABLET | Freq: Two times a day (BID) | ORAL | 0 refills | Status: DC

## 2019-07-26 NOTE — Patient Instructions (Signed)

## 2019-07-26 NOTE — Progress Notes (Signed)
Subjective:    Patient ID: Thomas Sandoval., male    DOB: [birth year], 64 y.o.   MRN: [id number]   Chief Complaint: Medical Management of Chronic Issues    HPI:  1. Essential hypertension No c/o chest pain, sob or headache. doess not check blood pressure at home BP Readings from Last 3 Encounters:  07/26/19 139/70  01/19/19 (!) 143/81  12/07/18 (!) 171/73     2. Type 2 diabetes mellitus without complication, with long-term current use of insulin (HCC) Fasting blood sugars are running around 120-200. He ay depend on his pain level Lab Results  Component Value Date   HGBA1C 7.1 (H) 05/02/2019     3. Hyperlipidemia, unspecified hyperlipidemia type Does not watch diet. 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 Lab Results  Component Value Date   CHOL 150 05/02/2019   HDL 54 05/02/2019   LDLCALC 77 05/02/2019   TRIG 104 05/02/2019   CHOLHDL 2.8 05/02/2019     4. Prostate cancer screening Lab Results  Component Value Date   PSA 2.1 03/27/2014      5. Bilateral hearing loss, unspecified hearing loss type You have to yell at him for him to hear you. He refuses to get hearing aids.  6. Atrial fibrillation with rapid ventricular response (HCC) Denies heart racing  7. Mixed simple and mucopurulent chronic bronchitis (HCC) Smoke daily. Has chronic cough. refuses to quit smoking.  8. Gastroesophageal reflux disease with esophagitis without hemorrhage Was on protonix but he aid it made him have bad dreams o he topped taking. Has occassional heart burn.  9. Mild renal insufficiency Lab Results  Component Value Date   CREATININE 1.24 05/02/2019   BUN 11 05/02/2019   NA 135 05/02/2019   K 4.6 05/02/2019   CL 98 05/02/2019   CO2 21 05/02/2019     10. Hyperkalemia Lab Results  Component Value Date   K 4.6 05/02/2019     11. Chronic midline low back pain without sciatica Pain assessment: Cause of pain- low back pain Pain location- low back  pain Pain on scale of 1-10- 8/10 currently Frequency- daily What increases pain-to much activity What makes pain Better-rest Effects on ADL - none Any change in general medical condition-none  Current opioids rx- norco 5/325 # meds rx- 60 Effectiveness of current meds-helps but always has some pain Adverse reactions form pain meds-none Morphine equivalent- 10 MEDD  Pill count performed-No Last drug screen - 09/16/18 ( high risk q64m moderate risk q637mlow risk yearly ) Urine drug screen today- No [place]  If yes were their any concerning finyedings? - no  Overdose risk: 1 Opioid Risk  09/16/2018  Alcohol 0  Illegal Drugs 0  Rx Drugs 0  Alcohol 0  Illegal Drugs 0  Rx Drugs 0  Age between 16-45 years  0  History of Preadolescent Sexual Abuse 0  Psychological Disease 0  Depression 0  Opioid Risk Tool Scoring 0  Opioid Risk Interpretation Low Risk     Pain contract signed on:     Outpatient Encounter Medications as of 07/26/2019  Medication Sig  . albuterol (PROVENTIL HFA;VENTOLIN HFA) 108 (90 Base) MCG/ACT inhaler Inhale 2 puffs every 6 (six) hours as needed into the lungs for wheezing or shortness of breath.  . Marland Kitchenspirin 81 MG tablet Take 81 mg by mouth daily.  . famotidine (PEPCID) 20 MG tablet Take 1 tablet (20 mg total) by mouth every morning.  Reported on 04/16/2015  . fluticasone (FLONASE) 50 MCG/ACT nasal spray SPRAY 2 SPRAYS INTO EACH NOSTRIL EVERY DAY  . Fluticasone-Salmeterol (ADVAIR DISKUS) 250-50 MCG/DOSE AEPB INHALE 1 PUFF INTO THE LUNGS EVERY EVENING.  Marland Kitchen Fluticasone-Umeclidin-Vilant (TRELEGY ELLIPTA) 100-62.5-25 MCG/INH AEPB Inhale 2 puffs into the lungs daily.  Marland Kitchen HYDROcodone-acetaminophen (NORCO/VICODIN) 5-325 MG tablet Take 1 tablet by mouth in the morning and at bedtime.  . insulin aspart (NOVOLOG) 100 UNIT/ML injection PER SLIDING SCALE: 190 - 200 = 2 UNITS 300 AND ABOVE = 7 UNITS  . insulin glargine (LANTUS) 100 UNIT/ML injection INJECT  40 UNITS SUBCUTANEOUSLY EVERY MORNING  . Insulin Syringes, Disposable, U-100 1 ML MISC Use 4 times a day for insulin injection Dx E11.9  . lisinopril (ZESTRIL) 10 MG tablet Take 1 tablet (10 mg total) by mouth daily.  Marland Kitchen loratadine (CLARITIN) 10 MG tablet Take 10 mg by mouth at bedtime.      Past Surgical History:  Procedure Laterality Date  . CYSTOSCOPY WITH URETEROSCOPY Right 08/14/2013   Procedure: CYSTOSCOPY WITH URETEROSCOPY BLADDER BIOPSY ;  Surgeon: Claybon Jabs, MD;  Location: [facility];  Service: Urology;  Laterality: Right;  . ESOPHAGOGASTRODUODENOSCOPY N/A 02/06/2013   Procedure: ESOPHAGOGASTRODUODENOSCOPY (EGD);  Surgeon: Lafayette Dragon, MD;  [facility] ENDOSCOPY;  Service: Endoscopy;  Laterality: N/A;  . LAPAROSCOPY N/A 11/25/2014   Procedure: LAPAROSCOPIC PRIMARY REPAIR OF PERFORATED PREPYLORIC ULCER WITH Silvestre Gunner;  Surgeon: Greer Pickerel, MD;  Location: [place];  Service: General;  Laterality: N/A;  . TONSILLECTOMY  as child  . TRANSTHORACIC ECHOCARDIOGRAM  02-17-2011   MODERATE LVH/  EF 65%  . TRANSURETHRAL RESECTION OF BLADDER TUMOR WITH GYRUS (TURBT-GYRUS) N/A 06/12/2013   Procedure: TRANSURETHRAL RESECTION OF BLADDER TUMOR WITH GYRUS (TURBT-GYRUS);  Surgeon: Claybon Jabs, MD;  Location: [facility];  Service: Urology;  Laterality: N/A;  . TYMPANIC MEMBRANE REPAIR  as child    Family History  Problem Relation Age of Onset  . Breast cancer Mother   . Cancer Mother        Breast  . Rheumatic fever Father   . Heart disease Father   . Heart attack Father        Massive   . 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  Eyes: Negative for pain.  Respiratory: Positive for shortness of breath and wheezing.   Cardiovascular: Negative for chest pain, palpitations and leg swelling.   Gastrointestinal: Negative for abdominal pain.  Endocrine: Negative for polydipsia.  Skin: Negative for rash.  Neurological: Negative for dizziness, weakness and headaches.  Hematological: Does not bruise/bleed easily.  All other systems reviewed and are negative.      Objective:   Physical Exam Vitals and nursing note reviewed.  Constitutional:      Appearance: Normal appearance. He is well-developed.  HENT:     Head: Normocephalic.     Ears:     Comments: bil hearing loss    Nose: Nose normal.  Eyes:     Pupils: Pupils are equal, round, and reactive to light.  Neck:     Thyroid: No thyroid mass or thyromegaly.     Vascular: No carotid bruit or JVD.     Trachea: Phonation normal.  Cardiovascular:     Rate and Rhythm: Normal rate and regular rhythm.  Pulmonary:  Effort: Pulmonary effort is normal. No respiratory distress.     Breath sounds: Wheezing (exp) present. Rhonchi: throughout.  Abdominal:     General: Bowel sounds are normal.     Palpations: Abdomen is soft.     Tenderness: There is no abdominal tenderness.  Musculoskeletal:        General: Normal range of motion.     Cervical back: Normal range of motion and neck supple.     Comments: rises slowly from sittimg to standing (-) SLR bil   Lymphadenopathy:     Cervical: No cervical adenopathy.  Skin:    General: Skin is warm and dry.  Neurological:     Mental Status: He is alert and oriented to person, place, and time.  Psychiatric:        Behavior: Behavior normal.        Thought Content: Thought content normal.        Judgment: Judgment normal.    BP 139/70   Pulse 87   Temp 98.3 F (36.8 C) (Temporal)   Resp 20   Ht 5' 10"  (1.778 m)   Wt 180 lb (81.6 kg)   SpO2 96%   BMI 25.83 kg/m   hgba1c 6.6     Assessment & Plan:  Thomas Sandoval. comes in today with chief complaint of Medical Management of Chronic Issues   Diagnosis and orders addressed:  1. Essential hypertension Low  sodium diet - CBC with Differential/Platelet - CMP14+EGFR  2. 14. Type 2 diabetes mellitus with stage 2 chronic kidney disease, with long-term current use of insulin (HCC) - insulin glargine (LANTUS) 100 UNIT/ML injection; INJECT 40 UNITS SUBCUTANEOUSLY EVERY MORNING  Dispense: 10 mL; Refill: 2 - insulin aspart (NOVOLOG) 100 UNIT/ML injection; PER SLIDING SCALE: 190 - 200 = 2 UNITS 300 AND ABOVE = 7 UNITS  Dispense: 30 mL; Refill: 0   Watch carbs in diet - Bayer DCA Hb A1c Waived  3. Hyperlipidemia, unspecified hyperlipidemia type Low fat diet - Lipid panel  4. Prostate cancer screening - PSA, total and free  5. Bilateral hearing loss, unspecified hearing loss type Encouraged to get hearing aid  6. Atrial fibrillation with rapid ventricular response (HCC) Avoid caffeine  7. Mixed simple and mucopurulent chronic bronchitis (HCC) Continue inhalers - Fluticasone-Umeclidin-Vilant (TRELEGY ELLIPTA) 100-62.5-25 MCG/INH AEPB; Inhale 2 puffs into the lungs daily.  Dispense: 60 each; Refill: 2 - Fluticasone-Salmeterol (ADVAIR DISKUS) 250-50 MCG/DOSE AEPB; INHALE 1 PUFF INTO THE LUNGS EVERY EVENING.  Dispense: 180 each; Refill: 1  8. Gastroesophageal reflux disease with esophagitis without hemorrhage Avoid spicy foods Do not eat 2 hours prior to bedtime  famotidine (PEPCID) 20 MG tablet; Take 1 tablet (20 mg total) by mouth every morning. Reported on 04/16/2015  Dispense: 30 tablet; Refill: 5   9. Mild renal insufficiency  10. Hyperkalemia Lab pending  11. Chronic midline low back pain without sciatica moist heat ret - HYDROcodone-acetaminophen (NORCO/VICODIN) 5-325 MG tablet; Take 1 tablet by mouth in the morning and at bedtime.  Dispense: 60 tablet; Refill: 0 - HYDROcodone-acetaminophen (NORCO/VICODIN) 5-325 MG tablet; Take 1 tablet by mouth in the morning and at bedtime.  Dispense: 60 tablet; Refill: 0 - HYDROcodone-acetaminophen (NORCO) 5-325 MG tablet; Take 1 tablet by mouth in  the morning and at bedtime.  Dispense: 60 tablet; Refill: 0    Labs pending Health Maintenance reviewed Diet and exercise encouraged  Follow up plan:  [name] Hassell Done, FNP

## 2019-07-27 LAB — CMP14+EGFR
ALT: 10 IU/L (ref 0–44)
AST: 22 IU/L (ref 0–40)
Albumin/Globulin Ratio: 1.8 (ref 1.2–2.2)
Albumin: 4.1 g/dL (ref 3.8–4.8)
Alkaline Phosphatase: 64 IU/L (ref 48–121)
BUN/Creatinine Ratio: 7 — ABNORMAL LOW (ref 10–24)
BUN: 8 mg/dL (ref 8–27)
Bilirubin Total: 0.4 mg/dL (ref 0.0–1.2)
CO2: 18 mmol/L — ABNORMAL LOW (ref 20–29)
Calcium: 9.1 mg/dL (ref 8.6–10.2)
Chloride: 100 mmol/L (ref 96–106)
Creatinine, Ser: 1.19 mg/dL (ref 0.76–1.27)
GFR calc Af Amer: 76 mL/min/{1.73_m2} (ref >59)
GFR calc non Af Amer: 66 mL/min/{1.73_m2} (ref >59)
Globulin, Total: 2.3 g/dL (ref 1.5–4.5)
Glucose: 110 mg/dL — ABNORMAL HIGH (ref 65–99)
Potassium: 4.2 mmol/L (ref 3.5–5.2)
Sodium: 137 mmol/L (ref 134–144)
Total Protein: 6.4 g/dL (ref 6.0–8.5)

## 2019-07-27 LAB — PSA, TOTAL AND FREE
PSA, Free Pct: 28.3 %
PSA, Free: 0.34 ng/mL
Prostate Specific Ag, Serum: 1.2 ng/mL (ref 0.0–4.0)

## 2019-07-27 LAB — CBC WITH DIFFERENTIAL/PLATELET
Basophils Absolute: 0.1 10*3/uL (ref 0.0–0.2)
Basos: 1 %
EOS (ABSOLUTE): 0.3 10*3/uL (ref 0.0–0.4)
Eos: 4 %
Hematocrit: 43.1 % (ref 37.5–51.0)
Hemoglobin: 14.9 g/dL (ref 13.0–17.7)
Immature Grans (Abs): 0 10*3/uL (ref 0.0–0.1)
Immature Granulocytes: 0 %
Lymphocytes Absolute: 1.4 10*3/uL (ref 0.7–3.1)
Lymphs: 18 %
MCH: 34.7 pg — ABNORMAL HIGH (ref 26.6–33.0)
MCHC: 34.6 g/dL (ref 31.5–35.7)
MCV: 100 fL — ABNORMAL HIGH (ref 79–97)
Monocytes Absolute: 0.7 10*3/uL (ref 0.1–0.9)
Monocytes: 9 %
Neutrophils Absolute: 5.3 10*3/uL (ref 1.4–7.0)
Neutrophils: 68 %
Platelets: 231 10*3/uL (ref 150–450)
RBC: 4.3 x10E6/uL (ref 4.14–5.80)
RDW: 11.8 % (ref 11.6–15.4)
WBC: 7.7 10*3/uL (ref 3.4–10.8)

## 2019-07-27 LAB — LIPID PANEL
Chol/HDL Ratio: 2.7 ratio (ref 0.0–5.0)
Cholesterol, Total: 125 mg/dL (ref 100–199)
HDL: 46 mg/dL (ref >39)
LDL Chol Calc (NIH): 51 mg/dL (ref 0–99)
Triglycerides: 170 mg/dL — ABNORMAL HIGH (ref 0–149)
VLDL Cholesterol Cal: 28 mg/dL (ref 5–40)

## 2019-08-21 ENCOUNTER — Telehealth: Payer: Self-pay

## 2019-08-21 NOTE — Telephone Encounter (Signed)
Pharmacy informed that prior authorization has been approved.

## 2019-08-21 NOTE — Telephone Encounter (Signed)
Key: [id number] - PA Case ID: [id number] -   Status Sent to [facility]  Drug HYDROcodone-Acetaminophen 5-325MG  tablets

## 2019-09-07 ENCOUNTER — Other Ambulatory Visit: Payer: Self-pay | Admitting: Nurse Practitioner

## 2019-09-07 DIAGNOSIS — K219 Gastro-esophageal reflux disease without esophagitis: Secondary | ICD-10-CM

## 2019-10-27 ENCOUNTER — Telehealth: Payer: Self-pay | Admitting: Nurse Practitioner

## 2019-10-30 NOTE — Telephone Encounter (Signed)
APPOINTMENT SCHEDULED

## 2019-10-31 ENCOUNTER — Telehealth: Payer: 59 | Admitting: Family

## 2019-11-03 ENCOUNTER — Encounter: Payer: Self-pay | Admitting: Nurse Practitioner

## 2019-11-03 ENCOUNTER — Other Ambulatory Visit: Payer: Self-pay

## 2019-11-03 ENCOUNTER — Ambulatory Visit: Payer: 59 | Admitting: Nurse Practitioner

## 2019-11-03 VITALS — BP 133/63 | HR 89 | Temp 98.1°F | Resp 20 | Ht 70.0 in | Wt 183.0 lb

## 2019-11-03 DIAGNOSIS — M545 Low back pain: Secondary | ICD-10-CM

## 2019-11-03 DIAGNOSIS — I4891 Unspecified atrial fibrillation: Secondary | ICD-10-CM | POA: Diagnosis not present

## 2019-11-03 DIAGNOSIS — G8929 Other chronic pain: Secondary | ICD-10-CM

## 2019-11-03 DIAGNOSIS — J418 Mixed simple and mucopurulent chronic bronchitis: Secondary | ICD-10-CM

## 2019-11-03 DIAGNOSIS — Z125 Encounter for screening for malignant neoplasm of prostate: Secondary | ICD-10-CM

## 2019-11-03 DIAGNOSIS — Z794 Long term (current) use of insulin: Secondary | ICD-10-CM

## 2019-11-03 DIAGNOSIS — K2100 Gastro-esophageal reflux disease with esophagitis, without bleeding: Secondary | ICD-10-CM

## 2019-11-03 DIAGNOSIS — E119 Type 2 diabetes mellitus without complications: Secondary | ICD-10-CM

## 2019-11-03 DIAGNOSIS — I10 Essential (primary) hypertension: Secondary | ICD-10-CM

## 2019-11-03 DIAGNOSIS — I341 Nonrheumatic mitral (valve) prolapse: Secondary | ICD-10-CM

## 2019-11-03 DIAGNOSIS — E875 Hyperkalemia: Secondary | ICD-10-CM

## 2019-11-03 DIAGNOSIS — L03313 Cellulitis of chest wall: Secondary | ICD-10-CM

## 2019-11-03 LAB — BAYER DCA HB A1C WAIVED: HB A1C (BAYER DCA - WAIVED): 6 % (ref <7.0)

## 2019-11-03 MED ORDER — HYDROCODONE-ACETAMINOPHEN 5-325 MG PO TABS: 1.0000 | ORAL_TABLET | Freq: Two times a day (BID) | ORAL | 0 refills | Status: DC

## 2019-11-03 MED ORDER — TRELEGY ELLIPTA 100-62.5-25 MCG/INH IN AEPB: 2.0000 | INHALATION_SPRAY | Freq: Every day | RESPIRATORY_TRACT | 2 refills | Status: DC

## 2019-11-03 MED ORDER — HYDROCODONE-ACETAMINOPHEN 5-325 MG PO TABS: 1.0000 | ORAL_TABLET | Freq: Two times a day (BID) | ORAL | 0 refills | Status: AC

## 2019-11-03 MED ORDER — CEPHALEXIN 500 MG PO CAPS: 500.0000 mg | ORAL_CAPSULE | Freq: Three times a day (TID) | ORAL | 0 refills | Status: DC

## 2019-11-03 MED ORDER — LISINOPRIL 10 MG PO TABS: 10.0000 mg | ORAL_TABLET | Freq: Every day | ORAL | 3 refills | Status: DC

## 2019-11-03 MED ORDER — FLUTICASONE-SALMETEROL 250-50 MCG/DOSE IN AEPB: INHALATION_SPRAY | RESPIRATORY_TRACT | 1 refills | Status: DC

## 2019-11-03 MED ORDER — INSULIN ASPART 100 UNIT/ML ~~LOC~~ SOLN: SUBCUTANEOUS | 0 refills | Status: DC

## 2019-11-03 MED ORDER — FAMOTIDINE 20 MG PO TABS: 20.0000 mg | ORAL_TABLET | Freq: Every morning | ORAL | 5 refills | Status: DC

## 2019-11-03 MED ORDER — INSULIN GLARGINE 100 UNIT/ML ~~LOC~~ SOLN: SUBCUTANEOUS | 2 refills | Status: DC

## 2019-11-03 NOTE — Patient Instructions (Signed)
Epidermal Cyst  An epidermal cyst is a small, painless lump under your skin. The cyst contains a grayish-white, bad-smelling substance (keratin). Do not try to pop or open an epidermal cyst yourself. What are the causes?  A blocked hair follicle.  A hair that curls and re-enters the skin instead of growing straight out of the skin.  A blocked pore.  Irritated skin.  An injury to the skin.  Certain conditions that are passed along from parent to child (inherited).  Human papillomavirus (HPV).  Long-term sun damage to the skin. What increases the risk?  Having acne.  Being overweight.  Being 30-40 years old. What are the signs or symptoms? These cysts are usually harmless, but they can get infected. Symptoms of infection may include:  Redness.  Inflammation.  Tenderness.  Warmth.  Fever.  A grayish-white, bad-smelling substance drains from the cyst.  Pus drains from the cyst. How is this treated? In many cases, epidermal cysts go away on their own without treatment. If a cyst becomes infected, treatment may include:  Opening and draining the cyst, done by a doctor. After draining, you may need minor surgery to remove the rest of the cyst.  Antibiotic medicine.  Shots of medicines (steroids) that help to reduce inflammation.  Surgery to remove the cyst. Surgery may be done if the cyst: ? Becomes large. ? Bothers you. ? Has a chance of turning into cancer.  Do not try to open a cyst yourself. Follow these instructions at home:  Take over-the-counter and prescription medicines only as told by your doctor.  If you were prescribed an antibiotic medicine, take it it as told by your doctor. Do not stop using the antibiotic even if you start to feel better.  Keep the area around your cyst clean and dry.  Wear loose, dry clothing.  Avoid touching your cyst.  Check your cyst every day for signs of infection. Check for: ? Redness, swelling, or pain. ? Fluid  or blood. ? Warmth. ? Pus or a bad smell.  Keep all follow-up visits as told by your doctor. This is important. How is this prevented?  Wear clean, dry, clothing.  Avoid wearing tight clothing.  Keep your skin clean and dry. Take showers or baths every day. Contact a doctor if:  Your cyst has symptoms of infection.  Your condition does not improve or gets worse.  You have a cyst that looks different from other cysts you have had.  You have a fever. Get help right away if:  Redness spreads from the cyst into the area close by. Summary  An epidermal cyst is a sac made of skin tissue.  If a cyst becomes infected, treatment may include surgery to open and drain the cyst, or to remove it.  Take over-the-counter and prescription medicines only as told by your doctor.  Contact a doctor if your condition is not improving or is getting worse.  Keep all follow-up visits as told by your doctor. This is important. This information is not intended to replace advice given to you by your health care provider. Make sure you discuss any questions you have with your health care provider. Document Revised: 06/16/2018 Document Reviewed: 12/02/2017 Elsevier Patient Education  2020 Elsevier Inc.  

## 2019-11-03 NOTE — Progress Notes (Signed)
Subjective:    Patient ID: Thomas Medici., male    DOB: [birth year], 64 y.o.   MRN: [id number]   Chief Complaint: medical management of chronic issues     HPI:  1. Essential hypertension No c/o chest pain, sob or headache. Does not check bloodpressure at home. BP Readings from Last 3 Encounters:  07/26/19 139/70  01/19/19 (!) 143/81  12/07/18 (!) 171/73     2. Mitral valve prolapse Has heart murmur- has not seen cardiology I n years and does not want to go see one right now.  3. Atrial fibrillation with rapid ventricular response (HCC) Denies heart palpitation or feeling of heart racing.  4. Mixed simple and mucopurulent chronic bronchitis (HCC) Smokes over a pack a day. Has no desire to stop at this time. He uses albuertol inhaler less than 2 x a week.  5. Gastroesophageal reflux disease with esophagitis without hemorrhage Takes pepcid daily  6. Type 2 diabetes mellitus without complication, with long-term current use of insulin (HCC) Fasting blood sugars are running around 100. Has occasional lows at night Hgba1c discussed at appointment   7. Hyperkalemia denies any lower ext cramping  8. Chronic midline low back pain without sciatica Pain assessment: Cause of pain- multiple back injuries in he past Pain location- low back pain Pain on scale of 1-10- 7/10 currently Frequency- daily What increases pain-"everything" What makes pain Better-rrst helps some Effects on ADL - none Any change in general medical condition-none  Current opioids rx- lortab 5/325 BID # meds rX: #60 Effectiveness of current meds-helps Adverse reactions form pain meds-none Morphine equivalent- 30  Pill count performed-No Last drug screen - 09/2018 ( high risk q86m moderate risk q627mlow risk yearly ) Urine drug screen today- yes [place]  If yes were their any concerning findings? - no   Overdose risk: 1 Opioid Risk  09/16/2018  Alcohol 0  Illegal Drugs  0  Rx Drugs 0  Alcohol 0  Illegal Drugs 0  Rx Drugs 0  Age between 16-45 years  0  History of Preadolescent Sexual Abuse 0  Psychological Disease 0  Depression 0  Opioid Risk Tool Scoring 0  Opioid Risk Interpretation Low Risk      Outpatient Encounter Medications as of 11/03/2019  Medication Sig  . albuterol (PROVENTIL HFA;VENTOLIN HFA) 108 (90 Base) MCG/ACT inhaler Inhale 2 puffs every 6 (six) hours as needed into the lungs for wheezing or shortness of breath.  . Marland Kitchenspirin 81 MG tablet Take 81 mg by mouth daily.  . famotidine (PEPCID) 20 MG tablet Take 1 tablet (20 mg total) by mouth every morning. Reported on 04/16/2015  . fluticasone (FLONASE) 50 MCG/ACT nasal spray SPRAY 2 SPRAYS INTO EACH NOSTRIL EVERY DAY  . Fluticasone-Salmeterol (ADVAIR DISKUS) 250-50 MCG/DOSE AEPB INHALE 1 PUFF INTO THE LUNGS EVERY EVENING.  . Marland Kitchenluticasone-Umeclidin-Vilant (TRELEGY ELLIPTA) 100-62.5-25 MCG/INH AEPB Inhale 2 puffs into the lungs daily.  . Marland KitchenYDROcodone-acetaminophen (NORCO/VICODIN) 5-325 MG tablet Take 1 tablet by mouth in the morning and at bedtime.  . insulin aspart (NOVOLOG) 100 UNIT/ML injection PER SLIDING SCALE: 190 - 200 = 2 UNITS 300 AND ABOVE = 7 UNITS  . insulin glargine (LANTUS) 100 UNIT/ML injection INJECT 40 UNITS SUBCUTANEOUSLY EVERY MORNING  . Insulin Syringes, Disposable, U-100 1 ML MISC Use 4 times a day for insulin injection Dx E11.9  . lisinopril (ZESTRIL) 10 MG tablet Take 1 tablet (10 mg total) by mouth daily.  . Marland Kitchenoratadine (CLARITIN) 10  MG tablet Take 10 mg by mouth at bedtime.       Past Surgical History:  Procedure Laterality Date  . CYSTOSCOPY WITH URETEROSCOPY Right 08/14/2013   Procedure: CYSTOSCOPY WITH URETEROSCOPY BLADDER BIOPSY ;  Surgeon: Claybon Jabs, MD;  Location: [facility];  Service: Urology;  Laterality: Right;  . ESOPHAGOGASTRODUODENOSCOPY N/A 02/06/2013   Procedure: ESOPHAGOGASTRODUODENOSCOPY (EGD);  Surgeon: Lafayette Dragon, MD;  Location: [facility]  ENDOSCOPY;  Service: Endoscopy;  Laterality: N/A;  . LAPAROSCOPY N/A 11/25/2014   Procedure: LAPAROSCOPIC PRIMARY REPAIR OF PERFORATED PREPYLORIC ULCER WITH Silvestre Gunner;  Surgeon: Greer Pickerel, MD;  Location: [place];  Service: General;  Laterality: N/A;  . TONSILLECTOMY  as child  . TRANSTHORACIC ECHOCARDIOGRAM  02-17-2011   MODERATE LVH/  EF 65%  . TRANSURETHRAL RESECTION OF BLADDER TUMOR WITH GYRUS (TURBT-GYRUS) N/A 06/12/2013   Procedure: TRANSURETHRAL RESECTION OF BLADDER TUMOR WITH GYRUS (TURBT-GYRUS);  Surgeon: Claybon Jabs, MD;  Location: [facility];  Service: Urology;  Laterality: N/A;  . TYMPANIC MEMBRANE REPAIR  as child    Family History  Problem Relation Age of Onset  . Breast cancer Mother   . Cancer Mother        Breast  . Rheumatic fever Father   . Heart disease Father   . Heart attack Father        Massive   . Diabetes Son     New complaints: Has a ruptured cyst on his chest. Has been there for sevral weeks.   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 Has very poor hearing and does not want to do anything about it.  Controlled substance contract: 11/03/19    Review of Systems  Constitutional: Negative for diaphoresis.  Eyes: Negative for pain.  Respiratory: Negative for shortness of breath.   Cardiovascular: Negative for chest pain, palpitations and leg swelling.  Gastrointestinal: Negative for abdominal pain.  Endocrine: Negative for polydipsia.  Skin: Negative for rash.  Neurological: Negative for dizziness, weakness and headaches.  Hematological: Does not bruise/bleed easily.  All other systems reviewed and are negative.      Objective:   Physical Exam Vitals and nursing note reviewed.  Constitutional:      Appearance: Normal appearance. He is well-developed.  HENT:     Head: Normocephalic.     Nose: Nose normal.  Eyes:     Pupils: Pupils are equal, round, and reactive to light.  Neck:     Thyroid: No thyroid mass or  thyromegaly.     Vascular: No carotid bruit or JVD.     Trachea: Phonation normal.  Cardiovascular:     Rate and Rhythm: Normal rate and regular rhythm.     Heart sounds: Murmur (2/6) heard.   Pulmonary:     Effort: Pulmonary effort is normal. No respiratory distress.     Breath sounds: Wheezing (exp throughout) present.  Abdominal:     General: Bowel sounds are normal.     Palpations: Abdomen is soft.     Tenderness: There is no abdominal tenderness.  Musculoskeletal:        General: Normal range of motion.     Cervical back: Normal range of motion and neck supple.  Lymphadenopathy:     Cervical: No cervical adenopathy.  Skin:    General: Skin is warm and dry.     Comments: Draining cyst on anterior chest wall.  Neurological:     Mental Status: He is alert and oriented  to person, place, and time.  Psychiatric:        Behavior: Behavior normal.        Thought Content: Thought content normal.        Judgment: Judgment normal.      BP 133/63   Pulse 89   Temp 98.1 F (36.7 C) (Temporal)   Resp 20   Ht 5' 10"  (1.778 m)   Wt 183 lb (83 kg)   SpO2 98%   BMI 26.26 kg/m  hgba1c 6.0      Assessment & Plan:  Thomas Sandoval. comes in today with chief complaint of Medical Management of Chronic Issues   Diagnosis and orders addressed:  1. Essential hypertension Low sodium diet - CBC with Differential/Platelet - CMP14+EGFR - Lipid panel - lisinopril (ZESTRIL) 10 MG tablet; Take 1 tablet (10 mg total) by mouth daily.  Dispense: 90 tablet; Refill: 3  2. Mitral valve prolapse  3. Atrial fibrillation with rapid ventricular response ([place]) Report any heart racing or palpitations  4. Mixed simple and mucopurulent chronic bronchitis (HCC) Smoking cessation encouraged - Fluticasone-Umeclidin-Vilant (TRELEGY ELLIPTA) 100-62.5-25 MCG/INH AEPB; Inhale 2 puffs into the lungs daily.  Dispense: 60 each; Refill: 2 - Fluticasone-Salmeterol (ADVAIR DISKUS) 250-50 MCG/DOSE  AEPB; INHALE 1 PUFF INTO THE LUNGS EVERY EVENING.  Dispense: 180 each; Refill: 1  5. Gastroesophageal reflux disease with esophagitis without hemorrhage Avoid spicy foods Do not eat 2 hours prior to bedtime - famotidine (PEPCID) 20 MG tablet; Take 1 tablet (20 mg total) by mouth every morning. Reported on 04/16/2015  Dispense: 30 tablet; Refill: 5  6. Type 2 diabetes mellitus without complication, with long-term current use of insulin (HCC) Continue to wtach carbs in diet - Bayer DCA Hb A1c Waived - insulin glargine (LANTUS) 100 UNIT/ML injection; INJECT 40 UNITS SUBCUTANEOUSLY EVERY MORNING  Dispense: 10 mL; Refill: 2 - insulin aspart (NOVOLOG) 100 UNIT/ML injection; PER SLIDING SCALE: 190 - 200 = 2 UNITS 300 AND ABOVE = 7 UNITS  Dispense: 30 mL; Refill: 0  7. Hyperkalemia   8. Chronic midline low back pain without sciatica Moist heat rest - HYDROcodone-acetaminophen (NORCO/VICODIN) 5-325 MG tablet; Take 1 tablet by mouth in the morning and at bedtime.  Dispense: 60 tablet; Refill: 0 - HYDROcodone-acetaminophen (NORCO) 5-325 MG tablet; Take 1 tablet by mouth in the morning and at bedtime.  Dispense: 60 tablet; Refill: 0 - HYDROcodone-acetaminophen (NORCO) 5-325 MG tablet; Take 1 tablet by mouth in the morning and at bedtime.  Dispense: 60 tablet; Refill: 0 - ToxASSURE Select 13 (MW), Urine  9. Prostate cancer screening - PSA, total and free  10. Cellulitis of chest wall Warm compresses Clean with antibacterial soap bid rechek in 1 week - Anaerobic and Aerobic Culture - cephALEXin (KEFLEX) 500 MG capsule; Take 1 capsule (500 mg total) by mouth 3 (three) times daily.  Dispense: 30 capsule; Refill: 0   Labs pending Health Maintenance reviewed Diet and exercise encouraged  Follow up plan: 3 months    [name] Hassell Done, FNP

## 2019-11-04 LAB — CBC WITH DIFFERENTIAL/PLATELET
Basophils Absolute: 0.1 10*3/uL (ref 0.0–0.2)
Basos: 1 %
EOS (ABSOLUTE): 0.1 10*3/uL (ref 0.0–0.4)
Eos: 2 %
Hematocrit: 42 % (ref 37.5–51.0)
Hemoglobin: 14.6 g/dL (ref 13.0–17.7)
Immature Grans (Abs): 0.1 10*3/uL (ref 0.0–0.1)
Immature Granulocytes: 1 %
Lymphocytes Absolute: 1.2 10*3/uL (ref 0.7–3.1)
Lymphs: 16 %
MCH: 35.9 pg — ABNORMAL HIGH (ref 26.6–33.0)
MCHC: 34.8 g/dL (ref 31.5–35.7)
MCV: 103 fL — ABNORMAL HIGH (ref 79–97)
Monocytes Absolute: 0.8 10*3/uL (ref 0.1–0.9)
Monocytes: 11 %
Neutrophils Absolute: 5.3 10*3/uL (ref 1.4–7.0)
Neutrophils: 69 %
Platelets: 209 10*3/uL (ref 150–450)
RBC: 4.07 x10E6/uL — ABNORMAL LOW (ref 4.14–5.80)
RDW: 12.3 % (ref 11.6–15.4)
WBC: 7.6 10*3/uL (ref 3.4–10.8)

## 2019-11-04 LAB — CMP14+EGFR
ALT: 9 IU/L (ref 0–44)
AST: 18 IU/L (ref 0–40)
Albumin/Globulin Ratio: 1.9 (ref 1.2–2.2)
Albumin: 4.1 g/dL (ref 3.8–4.8)
Alkaline Phosphatase: 62 IU/L (ref 48–121)
BUN/Creatinine Ratio: 10 (ref 10–24)
BUN: 10 mg/dL (ref 8–27)
Bilirubin Total: 0.6 mg/dL (ref 0.0–1.2)
CO2: 22 mmol/L (ref 20–29)
Calcium: 8.7 mg/dL (ref 8.6–10.2)
Chloride: 97 mmol/L (ref 96–106)
Creatinine, Ser: 1.03 mg/dL (ref 0.76–1.27)
GFR calc Af Amer: 90 mL/min/{1.73_m2} (ref >59)
GFR calc non Af Amer: 77 mL/min/{1.73_m2} (ref >59)
Globulin, Total: 2.2 g/dL (ref 1.5–4.5)
Glucose: 185 mg/dL — ABNORMAL HIGH (ref 65–99)
Potassium: 3.9 mmol/L (ref 3.5–5.2)
Sodium: 133 mmol/L — ABNORMAL LOW (ref 134–144)
Total Protein: 6.3 g/dL (ref 6.0–8.5)

## 2019-11-04 LAB — PSA, TOTAL AND FREE
PSA, Free Pct: 25 %
PSA, Free: 0.25 ng/mL
Prostate Specific Ag, Serum: 1 ng/mL (ref 0.0–4.0)

## 2019-11-04 LAB — LIPID PANEL
Chol/HDL Ratio: 2.6 ratio (ref 0.0–5.0)
Cholesterol, Total: 142 mg/dL (ref 100–199)
HDL: 54 mg/dL (ref >39)
LDL Chol Calc (NIH): 60 mg/dL (ref 0–99)
Triglycerides: 167 mg/dL — ABNORMAL HIGH (ref 0–149)
VLDL Cholesterol Cal: 28 mg/dL (ref 5–40)

## 2019-11-07 LAB — TOXASSURE SELECT 13 (MW), URINE

## 2019-11-08 LAB — ANAEROBIC AND AEROBIC CULTURE: Result 1: NEGATIVE — AB

## 2019-11-09 ENCOUNTER — Ambulatory Visit: Payer: 59 | Admitting: Nurse Practitioner

## 2019-11-09 ENCOUNTER — Other Ambulatory Visit: Payer: Self-pay

## 2019-11-09 ENCOUNTER — Encounter: Payer: Self-pay | Admitting: Nurse Practitioner

## 2019-11-09 VITALS — BP 140/64 | HR 76 | Temp 98.2°F | Resp 20 | Ht 70.0 in | Wt 184.0 lb

## 2019-11-09 DIAGNOSIS — L03313 Cellulitis of chest wall: Secondary | ICD-10-CM

## 2019-11-09 NOTE — Progress Notes (Signed)
   Subjective:    Patient ID: Thomas Sandoval., male    DOB: [birth year], 64 y.o.   MRN: [id number]   Chief Complaint: Abscess   HPI Patient was seen last week for follow up. He complained of abscess of anterior chest wall. He was given keflex and was told to use warm compresses and clean with antibacterial soap. He come sin today for recheck. He says it is doing better but has not completely healed.    Review of Systems  Constitutional: Negative for diaphoresis.  Eyes: Negative for pain.  Respiratory: Negative for shortness of breath.   Cardiovascular: Negative for chest pain, palpitations and leg swelling.  Gastrointestinal: Negative for abdominal pain.  Endocrine: Negative for polydipsia.  Skin: Negative for rash.  Neurological: Negative for dizziness, weakness and headaches.  Hematological: Does not bruise/bleed easily.  All other systems reviewed and are negative.      Objective:   Physical Exam Vitals and nursing note reviewed.  Constitutional:      Appearance: Normal appearance.  Cardiovascular:     Rate and Rhythm: Normal rate and regular rhythm.     Heart sounds: Normal heart sounds.  Pulmonary:     Effort: Pulmonary effort is normal.     Breath sounds: Normal breath sounds.  Skin:    General: Skin is warm.     Comments: Lesion still draining yellowish excudate- mildly indurated  Neurological:     Mental Status: He is alert.     BP 140/64   Pulse 76   Temp 98.2 F (36.8 C) (Temporal)   Resp 20   Ht 5\' 10"  (1.778 m)   Wt 184 lb (83.5 kg)   SpO2 97%   BMI 26.40 kg/m       Assessment & Plan:  Thomas Sandoval. in today with chief complaint of Abscess   1. Cellulitis of chest wall Continue keflex Clean with antibacterial soap TID Keep covered when working RTO prn     The above assessment and management plan was discussed with the patient. The patient verbalized understanding of and has agreed to the management plan. Patient is aware  to call the clinic if symptoms persist or worsen. Patient is aware when to return to the clinic for a follow-up visit. Patient educated on when it is appropriate to go to the emergency department.   [name] Hassell Done, FNP

## 2019-12-04 ENCOUNTER — Ambulatory Visit: Payer: 59 | Admitting: Family

## 2020-01-30 ENCOUNTER — Ambulatory Visit: Payer: 59 | Admitting: Nurse Practitioner

## 2020-01-30 ENCOUNTER — Encounter: Payer: Self-pay | Admitting: Nurse Practitioner

## 2020-01-30 ENCOUNTER — Other Ambulatory Visit: Payer: Self-pay

## 2020-01-30 VITALS — BP 148/72 | HR 70 | Temp 97.8°F | Resp 20 | Ht 70.0 in | Wt 183.0 lb

## 2020-01-30 DIAGNOSIS — I341 Nonrheumatic mitral (valve) prolapse: Secondary | ICD-10-CM

## 2020-01-30 DIAGNOSIS — J418 Mixed simple and mucopurulent chronic bronchitis: Secondary | ICD-10-CM

## 2020-01-30 DIAGNOSIS — Z794 Long term (current) use of insulin: Secondary | ICD-10-CM

## 2020-01-30 DIAGNOSIS — E875 Hyperkalemia: Secondary | ICD-10-CM

## 2020-01-30 DIAGNOSIS — K2100 Gastro-esophageal reflux disease with esophagitis, without bleeding: Secondary | ICD-10-CM

## 2020-01-30 DIAGNOSIS — M545 Low back pain, unspecified: Secondary | ICD-10-CM

## 2020-01-30 DIAGNOSIS — I10 Essential (primary) hypertension: Secondary | ICD-10-CM | POA: Diagnosis not present

## 2020-01-30 DIAGNOSIS — H9193 Unspecified hearing loss, bilateral: Secondary | ICD-10-CM

## 2020-01-30 DIAGNOSIS — G8929 Other chronic pain: Secondary | ICD-10-CM

## 2020-01-30 DIAGNOSIS — I4891 Unspecified atrial fibrillation: Secondary | ICD-10-CM | POA: Diagnosis not present

## 2020-01-30 DIAGNOSIS — E119 Type 2 diabetes mellitus without complications: Secondary | ICD-10-CM

## 2020-01-30 LAB — BAYER DCA HB A1C WAIVED: HB A1C (BAYER DCA - WAIVED): 6.1 % (ref <7.0)

## 2020-01-30 MED ORDER — HYDROCODONE-ACETAMINOPHEN 5-325 MG PO TABS: 1.0000 | ORAL_TABLET | Freq: Two times a day (BID) | ORAL | 0 refills | Status: DC

## 2020-01-30 MED ORDER — HYDROCODONE-ACETAMINOPHEN 5-325 MG PO TABS: 1.0000 | ORAL_TABLET | Freq: Two times a day (BID) | ORAL | 0 refills | Status: AC

## 2020-01-30 NOTE — Progress Notes (Signed)
Subjective:    Patient ID: Thomas Sandoval., male    DOB: January 27, 1956, 64 y.o.   MRN: [id number]   Chief Complaint: Medical Management of Chronic Issues    HPI:  1. Essential hypertension Checks BP at home sometimes. Chest pain and SOB with overexertion. Denies headaches, dizziness. Does not watch diet. BP Readings from Last 3 Encounters:  01/30/20 (!) 148/72  11/09/19 140/64  11/03/19 133/63    2. Atrial fibrillation with rapid ventricular response (HCC) Denies heart palpitations and racing heartbeat.   3. Mitral valve prolapse Last cardiology appt was over a year ago. Does not want to see cardiologist at this time.  4. Mixed simple and mucopurulent chronic bronchitis ([place]) Feels more tired lately. Has a frequent cough with changes in weather. Uses trelegy inhaler daily and albuterol inhaler sometimes. O2 sats at home are between 96-100. SOB with exertion.  5. Gastroesophageal reflux disease with esophagitis without hemorrhage Has reflux if he drinks too much liquor. Takes pepcid.  6. Type 2 diabetes mellitus without complication, with long-term current use of insulin ([place]) Checks blood sugars. Usually around 100. Denies blurry vision, numbness/tingling in feet. Does watch carb and sugar intake. Lab Results  Component Value Date   HGBA1C 6.0 11/03/2019    7. Bilateral hearing loss, unspecified hearing loss type Does not wear a hearing aid  8. Hyperkalemia Has cramps in legs sometimes at night. Denies heart palpitations. Lab Results  Component Value Date   K 3.9 11/03/2019    9. Chronic midline low back pain without sciatica Pain is getting worse.    Outpatient Encounter Medications as of 01/30/2020  Medication Sig  . albuterol (PROVENTIL HFA;VENTOLIN HFA) 108 (90 Base) MCG/ACT inhaler Inhale 2 puffs every 6 (six) hours as needed into the lungs for wheezing or shortness of breath.  Marland Kitchen aspirin 81 MG tablet Take 81 mg by mouth daily.  . famotidine (PEPCID)  20 MG tablet Take 1 tablet (20 mg total) by mouth every morning. Reported on 04/16/2015  . fluticasone (FLONASE) 50 MCG/ACT nasal spray SPRAY 2 SPRAYS INTO EACH NOSTRIL EVERY DAY  . Fluticasone-Salmeterol (ADVAIR DISKUS) 250-50 MCG/DOSE AEPB INHALE 1 PUFF INTO THE LUNGS EVERY EVENING.  Marland Kitchen Fluticasone-Umeclidin-Vilant (TRELEGY ELLIPTA) 100-62.5-25 MCG/INH AEPB Inhale 2 puffs into the lungs daily.  Marland Kitchen HYDROcodone-acetaminophen (NORCO/VICODIN) 5-325 MG tablet Take 1 tablet by mouth in the morning and at bedtime.  . insulin aspart (NOVOLOG) 100 UNIT/ML injection PER SLIDING SCALE: 190 - 200 = 2 UNITS 300 AND ABOVE = 7 UNITS  . insulin glargine (LANTUS) 100 UNIT/ML injection INJECT 40 UNITS SUBCUTANEOUSLY EVERY MORNING  . Insulin Syringes, Disposable, U-100 1 ML MISC Use 4 times a day for insulin injection Dx E11.9  . lisinopril (ZESTRIL) 10 MG tablet Take 1 tablet (10 mg total) by mouth daily.  Marland Kitchen loratadine (CLARITIN) 10 MG tablet Take 10 mg by mouth at bedtime.    . [DISCONTINUED] cephALEXin (KEFLEX) 500 MG capsule Take 1 capsule (500 mg total) by mouth 3 (three) times daily.   No facility-administered encounter medications on file as of 01/30/2020.    Past Surgical History:  Procedure Laterality Date  . CYSTOSCOPY WITH URETEROSCOPY Right 08/14/2013   Procedure: CYSTOSCOPY WITH URETEROSCOPY BLADDER BIOPSY ;  Surgeon: Claybon Jabs, MD;  Location: [facility];  Service: Urology;  Laterality: Right;  . ESOPHAGOGASTRODUODENOSCOPY N/A 02/06/2013   Procedure: ESOPHAGOGASTRODUODENOSCOPY (EGD);  Surgeon: Lafayette Dragon, MD;  Location: [facility];  Service: Endoscopy;  Laterality: N/A;  .  LAPAROSCOPY N/A 11/25/2014   Procedure: LAPAROSCOPIC PRIMARY REPAIR OF PERFORATED PREPYLORIC ULCER WITH Silvestre Gunner;  Surgeon: Greer Pickerel, MD;  Location: [place];  Service: General;  Laterality: N/A;  . TONSILLECTOMY  as child  . TRANSTHORACIC ECHOCARDIOGRAM  02-17-2011   MODERATE LVH/  EF 65%  .  TRANSURETHRAL RESECTION OF BLADDER TUMOR WITH GYRUS (TURBT-GYRUS) N/A 06/12/2013   Procedure: TRANSURETHRAL RESECTION OF BLADDER TUMOR WITH GYRUS (TURBT-GYRUS);  Surgeon: Claybon Jabs, MD;  Location: [facility];  Service: Urology;  Laterality: N/A;  . TYMPANIC MEMBRANE REPAIR  as child    Family History  Problem Relation Age of Onset  . Breast cancer Mother   . Cancer Mother        Breast  . Rheumatic fever Father   . Heart disease Father   . Heart attack Father        Massive   . Diabetes Son     New complaints: Would like to get back x-ray to see if it is getting worse.  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  Controlled substance contract: 11/03/19     Review of Systems  Constitutional: Positive for fatigue.  HENT: Negative.   Respiratory: Positive for cough, shortness of breath and wheezing.   Cardiovascular: Positive for chest pain (with exertion).  Gastrointestinal: Negative.   Genitourinary: Negative.   Musculoskeletal: Negative.   Skin: Negative.   Neurological: Negative.   Psychiatric/Behavioral: Negative.        Objective:   Physical Exam Vitals and nursing note reviewed.  Constitutional:      Appearance: Normal appearance.  HENT:     Head: Normocephalic.     Right Ear: There is impacted cerumen.     Left Ear: Tympanic membrane normal.     Nose: Nose normal.     Mouth/Throat:     Mouth: Mucous membranes are moist.     Pharynx: Oropharynx is clear.  Eyes:     Conjunctiva/sclera: Conjunctivae normal.     Pupils: Pupils are equal, round, and reactive to light.  Cardiovascular:     Rate and Rhythm: Normal rate and regular rhythm.     Pulses: Normal pulses.     Heart sounds: Normal heart sounds.  Pulmonary:     Effort: Pulmonary effort is normal.     Breath sounds: Wheezing present.  Abdominal:     General: Bowel sounds are normal.     Palpations: Abdomen is soft.  Musculoskeletal:        General: Normal range of motion.      Cervical back: Normal range of motion.  Skin:    General: Skin is warm and dry.  Neurological:     General: No focal deficit present.     Mental Status: He is alert and oriented to person, place, and time.  Psychiatric:        Mood and Affect: Mood normal.        Behavior: Behavior normal.    BP (!) 148/72   Pulse 70   Temp 97.8 F (36.6 C) (Temporal)   Resp 20   Ht _0  (1.778 m)   Wt 183 lb (83 kg)   SpO2 97%   BMI 26.26 kg/m   S/P right ear irrigation- TM clear-[name] Hassell Done, FNP     Assessment & Plan:  Thomas Sandoval. comes in today with chief complaint of Medical Management of Chronic Issues   Diagnosis and orders addressed:  1. Essential hypertension Low sodium  diet - CBC with Differential/Platelet - CMP14+EGFR - Lipid panel  2. Atrial fibrillation with rapid ventricular response ([place]) Report heart racing or palpitations  3. Mitral valve prolapse   4. Mixed simple and mucopurulent chronic bronchitis (HCC) Use albuterol inhaler PRN Smoking cessation encouraged  5. Gastroesophageal reflux disease with esophagitis without hemorrhage Avoid spicy foods Do not eat 2 hours prior to bedtime  6. Type 2 diabetes mellitus without complication, with long-term current use of insulin (HCC) Watch carbs in diet Check blood sugars daily - Bayer DCA Hb A1c Waived - Lipid panel  7. Bilateral hearing loss, unspecified hearing loss type Right cerumen impaction- debrox daily Needs hearing cheked but refuses  8. Hyperkalemia Labs pending  9. Chronic midline low back pain without sciatica Moist heat Rest - HYDROcodone-acetaminophen (NORCO/VICODIN) 5-325 MG tablet; Take 1 tablet by mouth in the morning and at bedtime.  Dispense: 60 tablet; Refill: 0 - HYDROcodone-acetaminophen (NORCO/VICODIN) 5-325 MG tablet; Take 1 tablet by mouth in the morning and at bedtime.  Dispense: 60 tablet; Refill: 0 - HYDROcodone-acetaminophen (NORCO/VICODIN) 5-325 MG  tablet; Take 1 tablet by mouth in the morning and at bedtime.  Dispense: 60 tablet; Refill: 0   Labs pending Health Maintenance reviewed Diet and exercise encouraged  Follow up plan: 3 months   [name] Hassell Done, FNP

## 2020-01-30 NOTE — Patient Instructions (Signed)
Opioid Pain Medicine Management Opioid pain medicines are strong medicines that are used to treat bad or very bad pain. When you take them for a short time, they can help you:  Sleep better.  Do better in physical therapy.  Feel better during the first few days after you get hurt.  Recover from surgery. Only take these medicines if a doctor says that you can. You should only take them for a short time. This is because opioids can be hard to stop taking (they are addictive). The longer you take opioids, the harder it may be to stop taking them (opioid use disorder). What are the risks? Opioids can cause problems (side effects). Taking them for more than 3 days raises your chance of problems, such as:  Trouble pooping (constipation).  Feeling sick to your stomach (nausea).  Vomiting.  Feeling very sleepy.  Confusion.  Not being able to stop taking the medicine.  Breathing problems. Taking opioids for a long time can make it hard for you to do daily tasks. It can also put you at risk for:  Car accidents.  Depression.  Suicide.  Heart attack.  Taking too much of the medicine (overdose), which can sometimes lead to death. What is a pain treatment plan? A pain treatment plan is a plan made by you and your doctor. Work with your doctor to make a plan for treating your pain. To help you do this:  Talk about the goals of your treatment, including: ? How much pain you might expect to have. ? How you will manage the pain.  Talk about the risks and benefits of taking these medicines for your condition.  Remember that a good treatment plan uses more than one approach and lowers the risks of side effects.  Tell your doctor about the amount of medicines you take and about any drug or alcohol use.  Get your pain medicine prescriptions from only one doctor. Pain can be managed with other treatments. Work with your doctor to find other ways to help your pain, such as:  Physical  therapy.  Counseling.  Eating healthy foods.  Brain exercises.  Massage.  Meditation.  Other pain medicines.  Doing gentle exercises. Tapering your use of opioids If you have been taking opioids for more than a few weeks, you may need to slowly decrease (taper) how much you take until you stop taking them. Doing this can lower your chance of having symptoms, such as:  Pain and cramping in your belly (abdomen).  Feeling sick to your stomach.  Sweating.  Feeling very sleepy.  Feeling restless.  Shaking you cannot control (tremors).  Cravings for the medicine. Do not try to stop taking them by yourself. Work with your doctor to stop. Your doctor will help you take less until you are not taking the medicine at all. Follow these instructions at home: Safety and storage   While you are taking opioids: ? Do not drive. ? Do not use machines or power tools. ? Do not sign important papers (legal documents). ? Do not drink alcohol. ? Do not take sleeping pills. ? Do not take care of children by yourself. ? Do not do activities where you need to climb or be in high places, like working on a ladder. ? Do not go into any water, such as a lake, river, ocean, swimming pool, or hot tub.  Keep your opioids locked up or in a place where children cannot reach them.  Do not share your   pain medicine with anyone. Getting rid of leftover pills Do not save any leftover pills. Get rid of leftover pills safely by:  Taking them to a take-back program in your area.  Bringing them to a pharmacy that has a container for throwing away pills (pill disposal).  Throwing them in the trash. Check the label or package insert of your medicine to see whether this is safe to do. If it is safe to throw them out: 1. Take the pills out of their container. 2. Mix the pills with pet poop or food scraps. 3. Put this in the trash. Activity  Return to your normal activities as told by your doctor. Ask  your doctor what activities are safe for you.  Avoid doing things that make your pain worse.  Do exercises as told by your doctor. General instructions  You may need to take these actions to prevent or treat trouble pooping: ? Drink enough fluid to keep your pee (urine) pale yellow. ? Take over-the-counter or prescription medicines. ? Eat foods that are high in fiber. These include beans, whole grains, and fresh fruits and vegetables. ? Limit foods that are high in fat and sugar. These include fried or sweet foods.  Keep all follow-up visits as told by your doctor. This is important. Where to find support If you have been taking opioids for a long time, think about getting help quitting from a local support group or counselor. Ask your doctor about this. Where to find more information Centers for Disease Control and Prevention (CDC): www.cdc.gov Get help right away if: Seek medical care right away if you are taking opioids and you, or people close to you, notice any of the following:  You have trouble breathing.  Your breathing is slower or more shallow than normal.  You have a very slow heartbeat.  You feel very confused.  You pass out (faint).  You are very sleepy.  Your speech is not normal.  You feel sick to your stomach and vomit.  You have cold skin.  You have blue lips or fingernails.  Your muscles are weak (limp) and your body seems floppy.  The black centers of your eyes (pupils) are smaller than normal. If you think that you or someone else may have taken too much of an opioid medicine, get medical help right away. Call your local emergency services (911 in the [place] Do not drive yourself to the hospital. If you ever feel like you may hurt yourself or others, or have thoughts about taking your own life, get help right away. You can go to your nearest emergency department or call:  Your local emergency services (911 in the [place]  The hotline of the National  Poison Control Center (1-800-222-1222 in the [place]  A suicide crisis helpline, such as the National Suicide Prevention Lifeline at 1-800-273-8255. This is open 24 hours a day. Summary  Opioid are strong medicines that are used to treat bad or very bad pain.  A pain treatment plan is a plan made by you and your doctor. Work with your doctor to make a plan for treating your pain.  Work with your doctor to find other ways to help your pain.  If you think that you or someone else may have taken too much of an opioid, get help right away. This information is not intended to replace advice given to you by your health care provider. Make sure you discuss any questions you have with your health care provider.   Document Revised: 03/25/2018 Document Reviewed: 03/25/2018 Elsevier Patient Education  2020 [employer]  

## 2020-01-31 LAB — LIPID PANEL
Chol/HDL Ratio: 2.6 ratio (ref 0.0–5.0)
Cholesterol, Total: 159 mg/dL (ref 100–199)
HDL: 61 mg/dL (ref >39)
LDL Chol Calc (NIH): 79 mg/dL (ref 0–99)
Triglycerides: 107 mg/dL (ref 0–149)
VLDL Cholesterol Cal: 19 mg/dL (ref 5–40)

## 2020-01-31 LAB — CMP14+EGFR
ALT: 11 IU/L (ref 0–44)
AST: 19 IU/L (ref 0–40)
Albumin/Globulin Ratio: 2 (ref 1.2–2.2)
Albumin: 4.4 g/dL (ref 3.8–4.8)
Alkaline Phosphatase: 66 IU/L (ref 44–121)
BUN/Creatinine Ratio: 8 — ABNORMAL LOW (ref 10–24)
BUN: 8 mg/dL (ref 8–27)
Bilirubin Total: 0.5 mg/dL (ref 0.0–1.2)
CO2: 23 mmol/L (ref 20–29)
Calcium: 9.5 mg/dL (ref 8.6–10.2)
Chloride: 96 mmol/L (ref 96–106)
Creatinine, Ser: 1.01 mg/dL (ref 0.76–1.27)
GFR calc Af Amer: 92 mL/min/{1.73_m2} (ref >59)
GFR calc non Af Amer: 79 mL/min/{1.73_m2} (ref >59)
Globulin, Total: 2.2 g/dL (ref 1.5–4.5)
Glucose: 62 mg/dL — ABNORMAL LOW (ref 65–99)
Potassium: 4.1 mmol/L (ref 3.5–5.2)
Sodium: 134 mmol/L (ref 134–144)
Total Protein: 6.6 g/dL (ref 6.0–8.5)

## 2020-01-31 LAB — CBC WITH DIFFERENTIAL/PLATELET
Basophils Absolute: 0.1 10*3/uL (ref 0.0–0.2)
Basos: 1 %
EOS (ABSOLUTE): 0.2 10*3/uL (ref 0.0–0.4)
Eos: 2 %
Hematocrit: 41.9 % (ref 37.5–51.0)
Hemoglobin: 15 g/dL (ref 13.0–17.7)
Immature Grans (Abs): 0.1 10*3/uL (ref 0.0–0.1)
Immature Granulocytes: 1 %
Lymphocytes Absolute: 1.2 10*3/uL (ref 0.7–3.1)
Lymphs: 15 %
MCH: 36.4 pg — ABNORMAL HIGH (ref 26.6–33.0)
MCHC: 35.8 g/dL — ABNORMAL HIGH (ref 31.5–35.7)
MCV: 102 fL — ABNORMAL HIGH (ref 79–97)
Monocytes Absolute: 1 10*3/uL — ABNORMAL HIGH (ref 0.1–0.9)
Monocytes: 12 %
Neutrophils Absolute: 5.9 10*3/uL (ref 1.4–7.0)
Neutrophils: 69 %
Platelets: 236 10*3/uL (ref 150–450)
RBC: 4.12 x10E6/uL — ABNORMAL LOW (ref 4.14–5.80)
RDW: 12.7 % (ref 11.6–15.4)
WBC: 8.5 10*3/uL (ref 3.4–10.8)

## 2020-02-06 ENCOUNTER — Other Ambulatory Visit: Payer: Self-pay | Admitting: Nurse Practitioner

## 2020-02-06 DIAGNOSIS — Z794 Long term (current) use of insulin: Secondary | ICD-10-CM

## 2020-02-06 DIAGNOSIS — E119 Type 2 diabetes mellitus without complications: Secondary | ICD-10-CM

## 2020-02-16 ENCOUNTER — Other Ambulatory Visit: Payer: Self-pay | Admitting: Nurse Practitioner

## 2020-02-16 DIAGNOSIS — J418 Mixed simple and mucopurulent chronic bronchitis: Secondary | ICD-10-CM

## 2020-04-20 ENCOUNTER — Other Ambulatory Visit: Payer: Self-pay | Admitting: Nurse Practitioner

## 2020-04-20 DIAGNOSIS — E119 Type 2 diabetes mellitus without complications: Secondary | ICD-10-CM

## 2020-04-20 DIAGNOSIS — Z794 Long term (current) use of insulin: Secondary | ICD-10-CM

## 2020-05-01 ENCOUNTER — Ambulatory Visit (INDEPENDENT_AMBULATORY_CARE_PROVIDER_SITE_OTHER): Payer: 59 | Admitting: Nurse Practitioner

## 2020-05-01 ENCOUNTER — Other Ambulatory Visit: Payer: Self-pay

## 2020-05-01 ENCOUNTER — Encounter: Payer: Self-pay | Admitting: Nurse Practitioner

## 2020-05-01 VITALS — BP 98/57 | HR 50 | Temp 97.6°F | Resp 20 | Ht 70.0 in | Wt 181.0 lb

## 2020-05-01 DIAGNOSIS — I10 Essential (primary) hypertension: Secondary | ICD-10-CM

## 2020-05-01 DIAGNOSIS — E875 Hyperkalemia: Secondary | ICD-10-CM

## 2020-05-01 DIAGNOSIS — K2100 Gastro-esophageal reflux disease with esophagitis, without bleeding: Secondary | ICD-10-CM

## 2020-05-01 DIAGNOSIS — I4891 Unspecified atrial fibrillation: Secondary | ICD-10-CM | POA: Diagnosis not present

## 2020-05-01 DIAGNOSIS — N289 Disorder of kidney and ureter, unspecified: Secondary | ICD-10-CM

## 2020-05-01 DIAGNOSIS — E119 Type 2 diabetes mellitus without complications: Secondary | ICD-10-CM

## 2020-05-01 DIAGNOSIS — L02233 Carbuncle of chest wall: Secondary | ICD-10-CM

## 2020-05-01 DIAGNOSIS — G8929 Other chronic pain: Secondary | ICD-10-CM

## 2020-05-01 DIAGNOSIS — Z794 Long term (current) use of insulin: Secondary | ICD-10-CM

## 2020-05-01 DIAGNOSIS — M545 Low back pain, unspecified: Secondary | ICD-10-CM

## 2020-05-01 DIAGNOSIS — J418 Mixed simple and mucopurulent chronic bronchitis: Secondary | ICD-10-CM

## 2020-05-01 LAB — BAYER DCA HB A1C WAIVED: HB A1C (BAYER DCA - WAIVED): 6 % (ref <7.0)

## 2020-05-01 MED ORDER — FAMOTIDINE 20 MG PO TABS
20.0000 mg | ORAL_TABLET | Freq: Every morning | ORAL | 5 refills | Status: DC
Start: 2020-05-01 — End: 2020-07-25

## 2020-05-01 MED ORDER — TRELEGY ELLIPTA 100-62.5-25 MCG/INH IN AEPB: INHALATION_SPRAY | RESPIRATORY_TRACT | 2 refills | Status: DC

## 2020-05-01 MED ORDER — HYDROCODONE-ACETAMINOPHEN 5-325 MG PO TABS: 1.0000 | ORAL_TABLET | Freq: Two times a day (BID) | ORAL | 0 refills | Status: AC

## 2020-05-01 MED ORDER — HYDROCODONE-ACETAMINOPHEN 5-325 MG PO TABS: 1.0000 | ORAL_TABLET | Freq: Two times a day (BID) | ORAL | 0 refills | Status: DC

## 2020-05-01 MED ORDER — LISINOPRIL 10 MG PO TABS
10.0000 mg | ORAL_TABLET | Freq: Every day | ORAL | 3 refills | Status: DC
Start: 2020-05-01 — End: 2020-07-25

## 2020-05-01 MED ORDER — INSULIN ASPART 100 UNIT/ML ~~LOC~~ SOLN: SUBCUTANEOUS | 0 refills | Status: DC

## 2020-05-01 MED ORDER — CEPHALEXIN 500 MG PO CAPS: 500.0000 mg | ORAL_CAPSULE | Freq: Two times a day (BID) | ORAL | 0 refills | Status: DC

## 2020-05-01 NOTE — Progress Notes (Signed)
Subjective:    Patient ID: Thomas Medici., male    DOB: 11-Jul-1955, 65 y.o.   MRN: [id number]   Chief Complaint: medical management of chronic issues     HPI:  1. Essential hypertension No c/o chest pain, sob or headache. Does not check blood pressure at home. BP Readings from Last 3 Encounters:  01/30/20 (!) 148/72  11/09/19 140/64  11/03/19 133/63     2. Atrial fibrillation with rapid ventricular response ([place]) He denies any palpitations. He does drink a lot of caffeine throughout the day. He is not on any blood thinners. Takes aspirin daily  3. Type 2 diabetes mellitus without complication, with long-term current use of insulin (HCC) Fasting blood sugars are all over the place. He checks them some days and not others. Lab Results  Component Value Date   HGBA1C 6.1 01/30/2020     4. Hyperkalemia Denies any lower ext cramping. Is on no potassium supplement Lab Results  Component Value Date   K 4.1 01/30/2020     5. Gastroesophageal reflux disease with esophagitis without hemorrhage Takes pepcid daily and does ok with that  6. Mixed simple and mucopurulent chronic bronchitis (HCC) Is on trelegy inhaler. Still smokes daily  7. Mild renal insufficiency No problems voiding Lab Results  Component Value Date   CREATININE 1.01 01/30/2020     8. Chronic midline low back pain without sciatica Had chronic back pain. 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 - no   Overdose risk: 1 Opioid Risk  09/16/2018  Alcohol 0  Illegal Drugs 0  Rx Drugs 0  Alcohol 0  Illegal Drugs 0  Rx Drugs 0  Age between 16-45 years  0  History of Preadolescent Sexual Abuse 0  Psychological Disease 0  Depression 0  Opioid Risk Tool Scoring 0  Opioid Risk Interpretation Low Risk     Pain contract signed on: 11/07/19     Outpatient Encounter Medications as of 05/01/2020  Medication Sig  . albuterol (PROVENTIL HFA;VENTOLIN HFA) 108 (90 Base) MCG/ACT inhaler Inhale 2 puffs every 6 (six) hours as needed into the lungs for wheezing or shortness of breath.  . Marland Kitchenspirin 81 MG tablet Take 81 mg by mouth daily.  . famotidine (PEPCID) 20 MG tablet Take 1 tablet (20 mg total) by mouth every morning. Reported on 04/16/2015  . fluticasone (FLONASE) 50 MCG/ACT nasal spray SPRAY 2 SPRAYS INTO EACH NOSTRIL EVERY DAY  . Fluticasone-Salmeterol (ADVAIR DISKUS) 250-50 MCG/DOSE AEPB INHALE 1 PUFF INTO THE LUNGS EVERY EVENING.  . insulin aspart (NOVOLOG) 100 UNIT/ML injection PER SLIDING SCALE: 190 - 200 = 2 UNITS 300 AND ABOVE = 7 UNITS  . insulin glargine (LANTUS) 100 UNIT/ML injection Inject 0.4 mLs (40 Units total) into the skin in the morning.  . Insulin Syringes, Disposable, U-100 1 ML MISC Use 4 times a day for insulin injection  Dx E11.9  . lisinopril (ZESTRIL) 10 MG tablet Take 1 tablet (10 mg total) by mouth daily.  Marland Kitchen loratadine (CLARITIN) 10 MG tablet Take 10 mg by mouth at bedtime.    . TRELEGY ELLIPTA 100-62.5-25 MCG/INH AEPB INHALE 2 PUFFS BY MOUTH INTO THE LUNGS DAILY   No facility-administered encounter medications on file as of 05/01/2020.    Past Surgical History:  Procedure Laterality Date  . CYSTOSCOPY WITH URETEROSCOPY Right 08/14/2013   Procedure: CYSTOSCOPY WITH URETEROSCOPY BLADDER BIOPSY ;  Surgeon: Claybon Jabs, MD;  Location: [facility];  Service: Urology;  Laterality: Right;  . ESOPHAGOGASTRODUODENOSCOPY N/A 02/06/2013   Procedure: ESOPHAGOGASTRODUODENOSCOPY (EGD);  Surgeon: Lafayette Dragon, MD;  Location[facility]COPY;  Service: Endoscopy;  Laterality: N/A;  . LAPAROSCOPY N/A 11/25/2014   Procedure: LAPAROSCOPIC PRIMARY REPAIR OF PERFORATED PREPYLORIC ULCER WITH Silvestre Gunner;  Surgeon: Greer Pickerel, MD;  Location: [place];  Service: General;  Laterality: N/A;  . TONSILLECTOMY  as child  . TRANSTHORACIC ECHOCARDIOGRAM  02-17-2011   MODERATE LVH/  EF 65%  . TRANSURETHRAL RESECTION OF BLADDER TUMOR WITH GYRUS (TURBT-GYRUS) N/A 06/12/2013   Procedure: TRANSURETHRAL RESECTION OF BLADDER TUMOR WITH GYRUS (TURBT-GYRUS);  Surgeon: Claybon Jabs, MD;  Location: [facility];  Service: Urology;  Laterality: N/A;  . TYMPANIC MEMBRANE REPAIR  as child    Family History  Problem Relation Age of Onset  . Breast cancer Mother   . Cancer Mother        Breast  . Rheumatic fever Father   . Heart disease Father   . Heart attack Father        Massive   . 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  Eyes: Negative for pain.  Respiratory: Negative for shortness of breath.   Cardiovascular: Negative for chest pain, palpitations and leg swelling.  Gastrointestinal: Negative for abdominal pain.  Endocrine: Negative for polydipsia.  Musculoskeletal: Positive for back pain.  Skin: Negative for rash.  Neurological: Negative for dizziness, weakness and headaches.  Hematological: Does not bruise/bleed easily.  All other systems reviewed and are negative.      Objective:   Physical Exam Vitals and nursing note reviewed.  Constitutional:      Appearance: Normal appearance. He is well-developed and well-nourished.  HENT:     Head: Normocephalic.     Nose:  Nose normal.     Mouth/Throat:     Mouth: Oropharynx is clear and moist.  Eyes:     Extraocular Movements: EOM normal.     Pupils: Pupils are equal, round, and reactive to light.  Neck:     Thyroid: No thyroid mass or thyromegaly.     Vascular: No carotid bruit or JVD.     Trachea: Phonation normal.  Cardiovascular:     Rate and Rhythm: Normal rate and regular rhythm.  Pulmonary:     Effort: Pulmonary effort is normal. No respiratory distress.     Breath sounds: Normal breath sounds.  Abdominal:     General: Bowel sounds are normal. Aorta is normal.     Palpations: Abdomen is soft.     Tenderness: There is no abdominal tenderness.  Musculoskeletal:        General: Normal range of motion.     Cervical back: Normal range of motion  and neck supple.  Lymphadenopathy:     Cervical: No cervical adenopathy.  Skin:    General: Skin is warm and dry.     Comments: 2cm erythematous raised lesion on anterior chest wall.  Neurological:     Mental Status: He is alert and oriented to person, place, and time.  Psychiatric:        Mood and Affect: Mood and affect normal.        Behavior: Behavior normal.        Thought Content: Thought content normal.        Judgment: Judgment normal.     BP (!) 98/57   Pulse (!) 50   Temp 97.6 F (36.4 C) (Temporal)   Resp 20   Ht 5' 10"  (1.778 m)   Wt 181 lb (82.1 kg)   SpO2 93%   BMI 25.97 kg/m         Assessment & Plan:  Thomas Medici. comes in today with chief complaint of Medical Management of Chronic Issues   Diagnosis and orders addressed:  1. Essential hypertension Low sodium diet - Bayer DCA Hb A1c Waived - CBC with Differential/Platelet - CMP14+EGFR - Lipid panel - lisinopril (ZESTRIL) 10 MG tablet; Take 1 tablet (10 mg total) by mouth daily.  Dispense: 90 tablet; Refill: 3  2. Atrial fibrillation with rapid ventricular response (HCC) avoid caffeine  3. Type 2 diabetes mellitus without complication, with long-term  current use of insulin (HCC) Continue to watch carbs in diet  4. Hyperkalemia Labs pending  5. Gastroesophageal reflux disease with esophagitis without hemorrhage Avoid spicy foods Do not eat 2 hours prior to bedtime - famotidine (PEPCID) 20 MG tablet; Take 1 tablet (20 mg total) by mouth every morning. Reported on 04/16/2015  Dispense: 30 tablet; Refill: 5  6. Mixed simple and mucopurulent chronic bronchitis (HCC) - Fluticasone-Umeclidin-Vilant (TRELEGY ELLIPTA) 100-62.5-25 MCG/INH AEPB; INHALE 2 PUFFS BY MOUTH INTO THE LUNGS DAILY  Dispense: 60 each; Refill: 2  7. Mild renal insufficiency Labs pending  8. Chronic midline low back pain without sciatica rest - HYDROcodone-acetaminophen (NORCO/VICODIN) 5-325 MG tablet; Take 1 tablet by mouth in the morning and at bedtime.  Dispense: 60 tablet; Refill: 0 - HYDROcodone-acetaminophen (NORCO/VICODIN) 5-325 MG tablet; Take 1 tablet by mouth in the morning and at bedtime.  Dispense: 60 tablet; Refill: 0 - HYDROcodone-acetaminophen (NORCO/VICODIN) 5-325 MG tablet; Take 1 tablet by mouth in the morning and at bedtime.  Dispense: 60 tablet; Refill: 0  9. Carbuncle, chest wall Warm compresses - cephALEXin (KEFLEX) 500 MG capsule; Take 1 capsule (500 mg total) by mouth 2 (two) times daily.  Dispense: 40 capsule; Refill: 0   Labs pending Health Maintenance reviewed Diet and exercise encouraged  Follow up plan: 3 months   [name] Hassell Done, FNP

## 2020-05-01 NOTE — Patient Instructions (Signed)
Skin Abscess  A skin abscess is an infected area of your skin that contains pus and other material. An abscess can happen in any part of your body. Some abscesses break open (rupture) on their own. Most continue to get worse unless they are treated. The infection can spread deeper into the body and into your blood, which can make you feel sick. A skin abscess is caused by germs that enter the skin through a cut or scrape. It can also be caused by blocked oil and sweat glands or infected hair follicles. This condition is usually treated by:  Draining the pus.  Taking antibiotic medicines.  Placing a warm, wet washcloth over the abscess. Follow these instructions at home: Medicines  Take over-the-counter and prescription medicines only as told by your doctor.  If you were prescribed an antibiotic medicine, take it as told by your doctor. Do not stop taking the antibiotic even if you start to feel better.   Abscess care  If you have an abscess that has not drained, place a warm, clean, wet washcloth over the abscess several times a day. Do this as told by your doctor.  Follow instructions from your doctor about how to take care of your abscess. Make sure you: ? Cover the abscess with a bandage (dressing). ? Change your bandage or gauze as told by your doctor. ? Wash your hands with soap and water before you change the bandage or gauze. If you cannot use soap and water, use hand sanitizer.  Check your abscess every day for signs that the infection is getting worse. Check for: ? More redness, swelling, or pain. ? More fluid or blood. ? Warmth. ? More pus or a bad smell.   General instructions  To avoid spreading the infection: ? Do not share personal care items, towels, or hot tubs with others. ? Avoid making skin-to-skin contact with other people.  Keep all follow-up visits as told by your doctor. This is important. Contact a doctor if:  You have more redness, swelling, or pain  around your abscess.  You have more fluid or blood coming from your abscess.  Your abscess feels warm when you touch it.  You have more pus or a bad smell coming from your abscess.  You have a fever.  Your muscles ache.  You have chills.  You feel sick. Get help right away if:  You have very bad (severe) pain.  You see red streaks on your skin spreading away from the abscess. Summary  A skin abscess is an infected area of your skin that contains pus and other material.  The abscess is caused by germs that enter the skin through a cut or scrape. It can also be caused by blocked oil and sweat glands or infected hair follicles.  Follow your doctor's instructions on caring for your abscess, taking medicines, preventing infections, and keeping follow-up visits. This information is not intended to replace advice given to you by your health care provider. Make sure you discuss any questions you have with your health care provider. Document Revised: 09/29/2018 Document Reviewed: 04/08/2017 Elsevier Patient Education  2021 [employer].

## 2020-05-02 LAB — CMP14+EGFR
ALT: 10 IU/L (ref 0–44)
AST: 20 IU/L (ref 0–40)
Albumin/Globulin Ratio: 1.6 (ref 1.2–2.2)
Albumin: 3.8 g/dL (ref 3.8–4.8)
Alkaline Phosphatase: 73 IU/L (ref 44–121)
BUN/Creatinine Ratio: 7 — ABNORMAL LOW (ref 10–24)
BUN: 9 mg/dL (ref 8–27)
Bilirubin Total: 0.6 mg/dL (ref 0.0–1.2)
CO2: 19 mmol/L — ABNORMAL LOW (ref 20–29)
Calcium: 9 mg/dL (ref 8.6–10.2)
Chloride: 101 mmol/L (ref 96–106)
Creatinine, Ser: 1.23 mg/dL (ref 0.76–1.27)
GFR calc Af Amer: 72 mL/min/{1.73_m2} (ref >59)
GFR calc non Af Amer: 63 mL/min/{1.73_m2} (ref >59)
Globulin, Total: 2.4 g/dL (ref 1.5–4.5)
Glucose: 116 mg/dL — ABNORMAL HIGH (ref 65–99)
Potassium: 3.9 mmol/L (ref 3.5–5.2)
Sodium: 136 mmol/L (ref 134–144)
Total Protein: 6.2 g/dL (ref 6.0–8.5)

## 2020-05-02 LAB — CBC WITH DIFFERENTIAL/PLATELET
Basophils Absolute: 0.1 10*3/uL (ref 0.0–0.2)
Basos: 1 %
EOS (ABSOLUTE): 0.1 10*3/uL (ref 0.0–0.4)
Eos: 2 %
Hematocrit: 38.1 % (ref 37.5–51.0)
Hemoglobin: 13.4 g/dL (ref 13.0–17.7)
Immature Grans (Abs): 0 10*3/uL (ref 0.0–0.1)
Immature Granulocytes: 1 %
Lymphocytes Absolute: 1.5 10*3/uL (ref 0.7–3.1)
Lymphs: 20 %
MCH: 35.9 pg — ABNORMAL HIGH (ref 26.6–33.0)
MCHC: 35.2 g/dL (ref 31.5–35.7)
MCV: 102 fL — ABNORMAL HIGH (ref 79–97)
Monocytes Absolute: 0.8 10*3/uL (ref 0.1–0.9)
Monocytes: 10 %
Neutrophils Absolute: 5 10*3/uL (ref 1.4–7.0)
Neutrophils: 66 %
Platelets: 204 10*3/uL (ref 150–450)
RBC: 3.73 x10E6/uL — ABNORMAL LOW (ref 4.14–5.80)
RDW: 12.1 % (ref 11.6–15.4)
WBC: 7.5 10*3/uL (ref 3.4–10.8)

## 2020-05-02 LAB — LIPID PANEL
Chol/HDL Ratio: 2.3 ratio (ref 0.0–5.0)
Cholesterol, Total: 126 mg/dL (ref 100–199)
HDL: 56 mg/dL (ref >39)
LDL Chol Calc (NIH): 52 mg/dL (ref 0–99)
Triglycerides: 96 mg/dL (ref 0–149)
VLDL Cholesterol Cal: 18 mg/dL (ref 5–40)

## 2020-06-10 ENCOUNTER — Encounter: Payer: Self-pay | Admitting: Nurse Practitioner

## 2020-06-10 ENCOUNTER — Ambulatory Visit (INDEPENDENT_AMBULATORY_CARE_PROVIDER_SITE_OTHER): Payer: 59

## 2020-06-10 ENCOUNTER — Ambulatory Visit (INDEPENDENT_AMBULATORY_CARE_PROVIDER_SITE_OTHER): Payer: 59 | Admitting: Nurse Practitioner

## 2020-06-10 VITALS — BP 121/72 | HR 129 | Temp 98.1°F

## 2020-06-10 DIAGNOSIS — J029 Acute pharyngitis, unspecified: Secondary | ICD-10-CM | POA: Diagnosis not present

## 2020-06-10 MED ORDER — DM-GUAIFENESIN ER 30-600 MG PO TB12: 1.0000 | ORAL_TABLET | Freq: Two times a day (BID) | ORAL | 0 refills | Status: DC

## 2020-06-10 MED ORDER — AMOXICILLIN-POT CLAVULANATE 875-125 MG PO TABS: 1.0000 | ORAL_TABLET | Freq: Two times a day (BID) | ORAL | 0 refills | Status: DC

## 2020-06-10 NOTE — Progress Notes (Signed)
Acute Office Visit  Subjective:    Patient ID: Thomas Sharp., male    DOB: 1955/11/17, 65 y.o.   MRN: [id number]  Chief Complaint  Patient presents with  . Cough    Cough This is a new problem. The current episode started in the past 7 days. The problem has been unchanged. The problem occurs constantly. The cough is productive of sputum. Associated symptoms include nasal congestion. Nothing aggravates the symptoms. The treatment provided significant relief. His past medical history is significant for COPD.  URI  This is a new problem. The current episode started in the past 7 days. The problem has been unchanged. There has been no fever. Associated symptoms include congestion and coughing. Pertinent negatives include no swollen glands. He has tried nothing for the symptoms.     Past Medical History:  Diagnosis Date  . Arthritis   . Bladder cancer ([place])   . Chronic back pain   . COPD (chronic obstructive pulmonary disease) ([place])   . DDD (degenerative disc disease)   . Diabetic retinopathy of both eyes ([place])   . Essential hypertension   . GERD (gastroesophageal reflux disease)   . History of atrial flutter 02/2011   Converted to NSR with Cardizem  . History of chronic bronchitis   . History of hemolytic anemia 02/2011   secondary to Avelox  . HOH (hard of hearing)   . HOH (hard of hearing)    no eardrum and nerve damage on R, also HOH on L  . Mitral valve prolapse    a. 2D Echo 11/27/14: EF 55-60%; images were inadequate for LV wall motion assessment, + mild late systolic mitral valve prolapse involving the anterior leaflet.  Marland Kitchen PAD (peripheral artery disease) ([place]) 04/2014   Dr Trula Slade; bilateral SFA occlusion, R mid, L distal  . PAF (paroxysmal atrial fibrillation) ([place])    a. Dx 11/2014 during admission for perf ulcer.  . Perforated ulcer ([place])    a. 11/2014 s/p surgery.  . Productive cough   . Smokers' cough ([place])   . Type 1 diabetes mellitus ([place]) 1977     Past Surgical History:  Procedure Laterality Date  . CYSTOSCOPY WITH URETEROSCOPY Right 08/14/2013   Procedure: CYSTOSCOPY WITH URETEROSCOPY BLADDER BIOPSY ;  Surgeon: Claybon Jabs, MD;  Location: [facility];  Service: Urology;  Laterality: Right;  . ESOPHAGOGASTRODUODENOSCOPY N/A 02/06/2013   Procedure: ESOPHAGOGASTRODUODENOSCOPY (EGD);  Surgeon: Lafayette Dragon, MD;  Location: [facility] ENDOSCOPY;  Service: Endoscopy;  Laterality: N/A;  . LAPAROSCOPY N/A 11/25/2014   Procedure: LAPAROSCOPIC PRIMARY REPAIR OF PERFORATED PREPYLORIC ULCER WITH Silvestre Gunner;  Surgeon: Greer Pickerel, MD;  Location: [place];  Service: General;  Laterality: N/A;  . TONSILLECTOMY  as child  . TRANSTHORACIC ECHOCARDIOGRAM  02-17-2011   MODERATE LVH/  EF 65%  . TRANSURETHRAL RESECTION OF BLADDER TUMOR WITH GYRUS (TURBT-GYRUS) N/A 06/12/2013   Procedure: TRANSURETHRAL RESECTION OF BLADDER TUMOR WITH GYRUS (TURBT-GYRUS);  Surgeon: Claybon Jabs, MD;  Location[facility];  Service: Urology;  Laterality: N/A;  . TYMPANIC MEMBRANE REPAIR  as child    Family History  Problem Relation Age of Onset  . Breast cancer Mother   . Cancer Mother        Breast  . Rheumatic fever Father   . Heart disease Father   . Heart attack Father        Massive   . Diabetes Son     Social History  Socioeconomic History  . Marital status: Married    Spouse name: Not on file  . Number of children: Not on file  . Years of education: Not on file  . Highest education level: Not on file  Occupational History  . Occupation: Tobacco Farmer  Tobacco Use  . Smoking status: Current Every Day Smoker    Packs/day: 2.00    Years: 30.00    Pack years: 60.00    Types: Cigarettes  . Smokeless tobacco: Former Systems developer    Quit date: 06/08/1978  Vaping Use  . Vaping Use: Never used  Substance and Sexual Activity  . Alcohol use: No    Alcohol/week: 0.0 standard drinks  . Drug use: No  . Sexual activity: Not on file   Other Topics Concern  . Not on file  Social History Narrative   Lives in [place] with wife and 2 sons.    Social Determinants of Health   Financial Resource Strain: Not on file  Food Insecurity: Not on file  Transportation Needs: Not on file  Physical Activity: Not on file  Stress: Not on file  Social Connections: Not on file  Intimate Partner Violence: Not on file    Outpatient Medications Prior to Visit  Medication Sig Dispense Refill  . albuterol (PROVENTIL HFA;VENTOLIN HFA) 108 (90 Base) MCG/ACT inhaler Inhale 2 puffs every 6 (six) hours as needed into the lungs for wheezing or shortness of breath. 8.5 Inhaler 2  . aspirin 81 MG tablet Take 81 mg by mouth daily.    . famotidine (PEPCID) 20 MG tablet Take 1 tablet (20 mg total) by mouth every morning. Reported on 04/16/2015 30 tablet 5  . fluticasone (FLONASE) 50 MCG/ACT nasal spray SPRAY 2 SPRAYS INTO EACH NOSTRIL EVERY DAY 48 mL 2  . Fluticasone-Salmeterol (ADVAIR DISKUS) 250-50 MCG/DOSE AEPB INHALE 1 PUFF INTO THE LUNGS EVERY EVENING. 180 each 1  . Fluticasone-Umeclidin-Vilant (TRELEGY ELLIPTA) 100-62.5-25 MCG/INH AEPB INHALE 2 PUFFS BY MOUTH INTO THE LUNGS DAILY 60 each 2  . HYDROcodone-acetaminophen (NORCO/VICODIN) 5-325 MG tablet Take 1 tablet by mouth in the morning and at bedtime. 60 tablet 0  . [START ON 06/30/2020] HYDROcodone-acetaminophen (NORCO/VICODIN) 5-325 MG tablet Take 1 tablet by mouth in the morning and at bedtime. 60 tablet 0  . insulin aspart (NOVOLOG) 100 UNIT/ML injection PER SLIDING SCALE: 190 - 200 = 2 UNITS 300 AND ABOVE = 7 UNITS 30 mL 0  . insulin glargine (LANTUS) 100 UNIT/ML injection Inject 0.4 mLs (40 Units total) into the skin in the morning. 10 mL 2  . Insulin Syringes, Disposable, U-100 1 ML MISC Use 4 times a day for insulin injection Dx E11.9 400 each 5  . lisinopril (ZESTRIL) 10 MG tablet Take 1 tablet (10 mg total) by mouth daily. 90 tablet 3  . loratadine (CLARITIN) 10 MG tablet Take 10 mg by  mouth at bedtime.    . cephALEXin (KEFLEX) 500 MG capsule Take 1 capsule (500 mg total) by mouth 2 (two) times daily. 40 capsule 0   No facility-administered medications prior to visit.    Allergies  Allergen Reactions  . Azithromycin Other (See Comments) and Nausea And Vomiting    "Severe stomach cramps; told to list as an allergy by dr. Huel Cote ago"  . Avelox [Moxifloxacin Hcl In Nacl] Other (See Comments)    Hemolysis  In 2012  . Bactrim [Sulfamethoxazole-Trimethoprim] Diarrhea and Nausea And Vomiting    Review of Systems  Constitutional: Negative.   HENT: Positive for congestion  and sinus pressure.   Respiratory: Positive for cough.   Gastrointestinal: Negative.   Genitourinary: Negative.   Musculoskeletal: Negative.   All other systems reviewed and are negative.      Objective:    Physical Exam Vitals reviewed.  Constitutional:      Appearance: Normal appearance.  HENT:     Head: Normocephalic.     Nose: Congestion present.  Eyes:     Conjunctiva/sclera: Conjunctivae normal.  Cardiovascular:     Rate and Rhythm: Normal rate and regular rhythm.     Pulses: Normal pulses.     Heart sounds: Normal heart sounds.  Pulmonary:     Comments: cough Abdominal:     General: Bowel sounds are normal.  Musculoskeletal:        General: Normal range of motion.  Skin:    General: Skin is warm.  Neurological:     Mental Status: He is alert and oriented to person, place, and time.     BP 121/72   Pulse (!) 129   Temp 98.1 F (36.7 C) (Temporal)   SpO2 95%  Wt Readings from Last 3 Encounters:  05/01/20 181 lb (82.1 kg)  01/30/20 183 lb (83 kg)  11/09/19 184 lb (83.5 kg)    Health Maintenance Due  Topic Date Due  . Hepatitis C Screening  Never done  . COVID-19 Vaccine (1) Never done  . OPHTHALMOLOGY EXAM  10/05/2014    There are no preventive care reminders to display for this patient.   Lab Results  Component Value Date   TSH 3.148 11/27/2014   Lab  Results  Component Value Date   WBC 7.5 05/01/2020   HGB 13.4 05/01/2020   HCT 38.1 05/01/2020   MCV 102 (H) 05/01/2020   PLT 204 05/01/2020   Lab Results  Component Value Date   NA 136 05/01/2020   K 3.9 05/01/2020   CO2 19 (L) 05/01/2020   GLUCOSE 116 (H) 05/01/2020   BUN 9 05/01/2020   CREATININE 1.23 05/01/2020   BILITOT 0.6 05/01/2020   ALKPHOS 73 05/01/2020   AST 20 05/01/2020   ALT 10 05/01/2020   PROT 6.2 05/01/2020   ALBUMIN 3.8 05/01/2020   CALCIUM 9.0 05/01/2020   ANIONGAP 9 08/26/2017   Lab Results  Component Value Date   CHOL 126 05/01/2020   Lab Results  Component Value Date   HDL 56 05/01/2020   Lab Results  Component Value Date   LDLCALC 52 05/01/2020   Lab Results  Component Value Date   TRIG 96 05/01/2020   Lab Results  Component Value Date   CHOLHDL 2.3 05/01/2020   Lab Results  Component Value Date   HGBA1C 6.0 05/01/2020       Assessment & Plan:  Acute pharyngitis Worsening symptoms of URI, cough and congestion with diminished lowe lung base on auscultation. Chest xray completed to [facility] out pneumonia, Muccinex for cough and congestion, z-pack. COVID-19 test completed with results pending. Encourage hydration and follow up with worsening or unresolved symptoms Education provided to patient with printed handout given.  Rx sent to pharmacy.  Problem List Items Addressed This Visit   None   Visit Diagnoses    Acute pharyngitis, unspecified etiology    -  Primary   Relevant Medications   amoxicillin-clavulanate (AUGMENTIN) 875-125 MG tablet   dextromethorphan-guaiFENesin (MUCINEX DM) 30-600 MG 12hr tablet   Other Relevant Orders   DG Chest 2 View   Novel Coronavirus, NAA ([facility])  Meds ordered this encounter  Medications  . amoxicillin-clavulanate (AUGMENTIN) 875-125 MG tablet    Sig: Take 1 tablet by mouth 2 (two) times daily.    Dispense:  14 tablet    Refill:  0    Order Specific Question:   Supervising Provider     Answer:   Janora Norlander [[id number]]  . dextromethorphan-guaiFENesin (MUCINEX DM) 30-600 MG 12hr tablet    Sig: Take 1 tablet by mouth 2 (two) times daily.    Dispense:  30 tablet    Refill:  0    Order Specific Question:   Supervising Provider    Answer:   Janora Norlander [[id number]]     Ivy Lynn, NP

## 2020-06-10 NOTE — Assessment & Plan Note (Signed)
Worsening symptoms of URI, cough and congestion with diminished lowe lung base on auscultation. Chest xray completed to [facility] out pneumonia, Muccinex for cough and congestion, z-pack. COVID-19 test completed with results pending. Encourage hydration and follow up with worsening or unresolved symptoms Education provided to patient with printed handout given.  Rx sent to pharmacy.

## 2020-06-11 LAB — SARS-COV-2, NAA 2 DAY TAT

## 2020-06-11 LAB — NOVEL CORONAVIRUS, NAA: SARS-CoV-2, NAA: NOT DETECTED

## 2020-06-12 ENCOUNTER — Telehealth: Payer: Self-pay | Admitting: Nurse Practitioner

## 2020-06-12 NOTE — Telephone Encounter (Signed)
Pt's wife stated that he was supposed to be called in cough syrup after his appt and was not.

## 2020-06-12 NOTE — Telephone Encounter (Signed)
I sent in guaifenesin for cough and congestion and antibiotic on 06/10/2020   Is patient asking for more cough medication?  Is his cough not resolving or getting worse?  It has been only 2 days

## 2020-06-12 NOTE — Telephone Encounter (Signed)
Left message to call back  

## 2020-06-12 NOTE — Telephone Encounter (Signed)
Pt calling back about cough medicine

## 2020-06-12 NOTE — Telephone Encounter (Signed)
Patient is not taking Mucinex because he said it dries him out so badly that he does not like to use it.  He is taking the antibiotic but would like cough syrup sent in for the cough.  Please advise.

## 2020-06-13 ENCOUNTER — Other Ambulatory Visit: Payer: Self-pay | Admitting: Nurse Practitioner

## 2020-06-13 MED ORDER — BENZONATATE 100 MG PO CAPS: 100.0000 mg | ORAL_CAPSULE | Freq: Three times a day (TID) | ORAL | 0 refills | Status: DC | PRN

## 2020-06-13 NOTE — Telephone Encounter (Signed)
Benzonatate sent to pharmacy.  Advised patient cough does take a little while to clear.  Please follow-up with worsening or unresolved symptoms.

## 2020-06-14 NOTE — Telephone Encounter (Signed)
Pt aware.

## 2020-07-05 ENCOUNTER — Other Ambulatory Visit: Payer: Self-pay | Admitting: Nurse Practitioner

## 2020-07-05 DIAGNOSIS — Z794 Long term (current) use of insulin: Secondary | ICD-10-CM

## 2020-07-05 DIAGNOSIS — E119 Type 2 diabetes mellitus without complications: Secondary | ICD-10-CM

## 2020-07-11 ENCOUNTER — Other Ambulatory Visit: Payer: Self-pay | Admitting: Nurse Practitioner

## 2020-07-11 DIAGNOSIS — E1122 Type 2 diabetes mellitus with diabetic chronic kidney disease: Secondary | ICD-10-CM

## 2020-07-22 ENCOUNTER — Other Ambulatory Visit: Payer: Self-pay | Admitting: Nurse Practitioner

## 2020-07-22 DIAGNOSIS — Z794 Long term (current) use of insulin: Secondary | ICD-10-CM

## 2020-07-22 DIAGNOSIS — E119 Type 2 diabetes mellitus without complications: Secondary | ICD-10-CM

## 2020-07-25 ENCOUNTER — Other Ambulatory Visit: Payer: Self-pay

## 2020-07-25 ENCOUNTER — Ambulatory Visit (INDEPENDENT_AMBULATORY_CARE_PROVIDER_SITE_OTHER): Payer: 59 | Admitting: Nurse Practitioner

## 2020-07-25 ENCOUNTER — Encounter: Payer: Self-pay | Admitting: Nurse Practitioner

## 2020-07-25 VITALS — BP 140/85 | HR 95 | Temp 97.5°F | Resp 20 | Ht 70.0 in | Wt 172.0 lb

## 2020-07-25 DIAGNOSIS — I4891 Unspecified atrial fibrillation: Secondary | ICD-10-CM

## 2020-07-25 DIAGNOSIS — I341 Nonrheumatic mitral (valve) prolapse: Secondary | ICD-10-CM | POA: Diagnosis not present

## 2020-07-25 DIAGNOSIS — E875 Hyperkalemia: Secondary | ICD-10-CM

## 2020-07-25 DIAGNOSIS — J418 Mixed simple and mucopurulent chronic bronchitis: Secondary | ICD-10-CM | POA: Diagnosis not present

## 2020-07-25 DIAGNOSIS — K2100 Gastro-esophageal reflux disease with esophagitis, without bleeding: Secondary | ICD-10-CM

## 2020-07-25 DIAGNOSIS — I10 Essential (primary) hypertension: Secondary | ICD-10-CM

## 2020-07-25 DIAGNOSIS — E119 Type 2 diabetes mellitus without complications: Secondary | ICD-10-CM

## 2020-07-25 DIAGNOSIS — E1122 Type 2 diabetes mellitus with diabetic chronic kidney disease: Secondary | ICD-10-CM

## 2020-07-25 DIAGNOSIS — M545 Low back pain, unspecified: Secondary | ICD-10-CM

## 2020-07-25 DIAGNOSIS — G8929 Other chronic pain: Secondary | ICD-10-CM

## 2020-07-25 DIAGNOSIS — Z794 Long term (current) use of insulin: Secondary | ICD-10-CM

## 2020-07-25 LAB — BAYER DCA HB A1C WAIVED: HB A1C (BAYER DCA - WAIVED): 6.3 % (ref <7.0)

## 2020-07-25 MED ORDER — INSULIN GLARGINE 100 UNIT/ML ~~LOC~~ SOLN: SUBCUTANEOUS | 0 refills | Status: DC

## 2020-07-25 MED ORDER — "BD INSULIN SYRINGE U/F 30G X 1/2"" 1 ML MISC": 3 refills | Status: DC

## 2020-07-25 MED ORDER — HYDROCODONE-ACETAMINOPHEN 5-325 MG PO TABS: 1.0000 | ORAL_TABLET | Freq: Two times a day (BID) | ORAL | 0 refills | Status: AC

## 2020-07-25 MED ORDER — HYDROCODONE-ACETAMINOPHEN 5-325 MG PO TABS: 1.0000 | ORAL_TABLET | Freq: Two times a day (BID) | ORAL | 0 refills | Status: DC

## 2020-07-25 MED ORDER — FAMOTIDINE 20 MG PO TABS: 20.0000 mg | ORAL_TABLET | Freq: Every morning | ORAL | 5 refills | Status: DC

## 2020-07-25 MED ORDER — LISINOPRIL 10 MG PO TABS: 10.0000 mg | ORAL_TABLET | Freq: Every day | ORAL | 3 refills | Status: DC

## 2020-07-25 NOTE — Progress Notes (Signed)
Subjective:    Patient ID: Thomas Medici., male    DOB: April 30, 1955, 65 y.o.   MRN: [id number]   Chief Complaint: medical management of chronic issues     HPI:  1. Essential hypertension No c/o chest pain, sob or headache. Does not check blood pressure at home. BP Readings from Last 3 Encounters:  06/10/20 121/72  05/01/20 (!) 98/57  01/30/20 (!) 148/72     2. Atrial fibrillation with rapid ventricular response (HCC) Denies palpitation and heart racing  3. Mitral valve prolapse Has not seen cardiology in several years. Says he does not feel the need to go   4. Mixed simple and mucopurulent chronic bronchitis (HCC) Has chronic cough and still smokes daily. He is on trelegy daily which helps with his sob .  5. Gastroesophageal reflux disease with esophagitis without hemorrhage Takes no meds on a daily basis. Will use otc meds when has flare up.  6. Type 2 diabetes mellitus without complication, with long-term current use of insulin (HCC) Blood sugars have been allover the place. He does not watch his diet at all. Denies any low blood sugars. Lab Results  Component Value Date   HGBA1C 6.0 05/01/2020     7. Hyperkalemia Denies any lower ext cramps Lab Results  Component Value Date   K 3.9 05/01/2020    8. Chronic midline low back pain without sciatica Pain assessment: Cause of pain- multiple injurys over lfe span-he fell off a bucket and landed in his back 2 weeks ago. 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 - none   Overdose risk: 2    Pain contract signed on:     Outpatient Encounter Medications as of 07/25/2020  Medication Sig  . albuterol (PROVENTIL HFA;VENTOLIN HFA) 108 (90 Base) MCG/ACT inhaler Inhale 2 puffs every 6 (six) hours as needed into the lungs for wheezing or shortness of breath.  . Marland Kitchenmoxicillin-clavulanate (AUGMENTIN) 875-125 MG tablet Take 1 tablet by mouth 2 (two) times daily.  . Marland Kitchenspirin 81 MG tablet Take 81 mg by mouth daily.  . benzonatate (TESSALON PERLES) 100 MG capsule Take 1 capsule (100 mg total) by mouth 3 (three) times daily as needed for cough.  . dextromethorphan-guaiFENesin (MUCINEX DM) 30-600 MG 12hr tablet Take 1 tablet by mouth 2 (two) times daily.  . famotidine (PEPCID) 20 MG tablet Take 1 tablet (20 mg total) by mouth every morning. Reported on 04/16/2015  . fluticasone (FLONASE) 50 MCG/ACT nasal spray SPRAY 2 SPRAYS INTO EACH NOSTRIL EVERY DAY  . Fluticasone-Salmeterol (ADVAIR DISKUS) 250-50 MCG/DOSE AEPB INHALE 1 PUFF INTO THE LUNGS EVERY EVENING.  . Marland Kitchenluticasone-Umeclidin-Vilant (TRELEGY ELLIPTA) 100-62.5-25 MCG/INH AEPB INHALE 2 PUFFS BY MOUTH INTO THE LUNGS DAILY  . HYDROcodone-acetaminophen (NORCO/VICODIN) 5-325 MG tablet Take 1 tablet by mouth in the morning and at bedtime.  . insulin aspart (NOVOLOG) 100 UNIT/ML injection PER SLIDING SCALE: 190 - 200 = 2 UNITS 300 AND ABOVE = 7 UNITS  . Insulin Syringe-Needle  U-100 (B-D INS SYR ULTRAFINE 1CC/30G) 30G X 1/2" 1 ML MISC Use 4 times a day with insulin Dx E11.9  . Insulin Syringes, Disposable, U-100 1 ML MISC Use 4 times a day for insulin injection Dx E11.9  . LANTUS 100 UNIT/ML injection INJECT 0.4 MLS (40 UNITS TOTAL) INTO THE SKIN IN THE MORNING.  Marland Kitchen lisinopril (ZESTRIL) 10 MG tablet Take 1 tablet (10 mg total) by mouth daily.  Marland Kitchen loratadine (CLARITIN) 10 MG tablet Take 10 mg by mouth at bedtime.   No  facility-administered encounter medications on file as of 07/25/2020.    Past Surgical History:  Procedure Laterality Date  . CYSTOSCOPY WITH URETEROSCOPY Right 08/14/2013   Procedure: CYSTOSCOPY WITH URETEROSCOPY BLADDER BIOPSY ;  Surgeon: Claybon Jabs, MD;  Location: [facility];  Service: Urology;  Laterality: Right;  . ESOPHAGOGASTRODUODENOSCOPY N/A 02/06/2013   Procedure: ESOPHAGOGASTRODUODENOSCOPY (EGD);  Surgeon: Lafayette Dragon, MD;  Location: [facility] ENDOSCOPY;  Service: Endoscopy;  Laterality: N/A;  . LAPAROSCOPY N/A 11/25/2014   Procedure: LAPAROSCOPIC PRIMARY REPAIR OF PERFORATED PREPYLORIC ULCER WITH Silvestre Gunner;  Surgeon: Greer Pickerel, MD;  Location: [place];  Service: General;  Laterality: N/A;  . TONSILLECTOMY  as child  . TRANSTHORACIC ECHOCARDIOGRAM  02-17-2011   MODERATE LVH/  EF 65%  . TRANSURETHRAL RESECTION OF BLADDER TUMOR WITH GYRUS (TURBT-GYRUS) N/A 06/12/2013   Procedure: TRANSURETHRAL RESECTION OF BLADDER TUMOR WITH GYRUS (TURBT-GYRUS);  Surgeon: Claybon Jabs, MD;  Location: [facility];  Service: Urology;  Laterality: N/A;  . TYMPANIC MEMBRANE REPAIR  as child    Family History  Problem Relation Age of Onset  . Breast cancer Mother   . Cancer Mother        Breast  . Rheumatic fever Father   . Heart disease Father   . Heart attack Father        Massive   . Diabetes Son     New complaints: None other then back pain worsening  Social history: Lives with his wife     Review of Systems  Constitutional: Negative for diaphoresis.  Eyes: Negative for pain.  Respiratory: Positive for cough and shortness of breath.   Cardiovascular: Negative for chest pain, palpitations and leg swelling.  Gastrointestinal: Negative for abdominal pain.  Endocrine: Negative for polydipsia.  Skin: Negative for rash.  Neurological: Negative for dizziness, weakness and headaches.  Hematological: Does not bruise/bleed easily.  All other systems reviewed  and are negative.      Objective:   Physical Exam Vitals and nursing note reviewed.  Constitutional:      Appearance: Normal appearance. He is well-developed.  HENT:     Head: Normocephalic.     Nose: Nose normal.  Eyes:     Pupils: Pupils are equal, round, and reactive to light.  Neck:     Thyroid: No thyroid mass or thyromegaly.     Vascular: No carotid bruit or JVD.     Trachea: Phonation normal.  Cardiovascular:     Rate and Rhythm: Normal rate and regular rhythm.  Pulmonary:     Effort: Pulmonary effort is normal. No respiratory distress.     Breath sounds: Wheezing (insp and exp throughout) and rhonchi (insp and exp throughout) present.  Abdominal:     General: Bowel sounds are normal.     Palpations: Abdomen is soft.     Tenderness: There is no abdominal tenderness.  Musculoskeletal:        General: Normal range of  motion.     Cervical back: Normal range of motion and neck supple.  Lymphadenopathy:     Cervical: No cervical adenopathy.  Skin:    General: Skin is warm and dry.  Neurological:     Mental Status: He is alert and oriented to person, place, and time.  Psychiatric:        Behavior: Behavior normal.        Thought Content: Thought content normal.        Judgment: Judgment normal.     BP 140/85   Pulse 95   Temp (!) 97.5 F (36.4 C) (Temporal)   Resp 20   Ht _0  (1.778 m)   Wt 172 lb (78 kg)   SpO2 94%   BMI 24.68 kg/m        Assessment & Plan:  Thomas Medici. comes in today with chief complaint of Medical Management of Chronic Issues   Diagnosis and orders addressed:  1. Essential hypertension Low sodium diet - CBC with Differential/Platelet - CMP14+EGFR - Lipid panel - lisinopril (ZESTRIL) 10 MG tablet; Take 1 tablet (10 mg total) by mouth daily.  Dispense: 90 tablet; Refill: 3  2. Atrial fibrillation with rapid ventricular response (HCC) Avoid caffeine  3. Mitral valve prolapse Will not follow up with  cardiology  4. Mixed simple and mucopurulent chronic bronchitis (HCC) Smoking cessation encouraged  5. Gastroesophageal reflux disease with esophagitis without hemorrhage Avoid spicy foods Do not eat 2 hours prior to bedtime - famotidine (PEPCID) 20 MG tablet; Take 1 tablet (20 mg total) by mouth every morning. Reported on 04/16/2015  Dispense: 30 tablet; Refill: 5  6. Type 2 diabetes mellitus without complication, with long-term current use of insulin (HCC) Continue to watch carbs in diet - Bayer DCA Hb A1c Waived - insulin glargine (LANTUS) 100 UNIT/ML injection; INJECT 0.4 MLS (40 UNITS TOTAL) INTO THE SKIN IN THE MORNING.  Dispense: 10 mL; Refill: 0  7. Hyperkalemia Labspending\  8. Chronic midline low back pain without sciatica Moist heat  rest - HYDROcodone-acetaminophen (NORCO/VICODIN) 5-325 MG tablet; Take 1 tablet by mouth in the morning and at bedtime.  Dispense: 60 tablet; Refill: 0 - HYDROcodone-acetaminophen (NORCO/VICODIN) 5-325 MG tablet; Take 1 tablet by mouth in the morning and at bedtime.  Dispense: 60 tablet; Refill: 0 - HYDROcodone-acetaminophen (NORCO/VICODIN) 5-325 MG tablet; Take 1 tablet by mouth in the morning and at bedtime.  Dispense: 60 tablet; Refill: 0  9. Type 2 diabetes mellitus with diabetic chronic kidney disease (HCC) - Insulin Syringe-Needle U-100 (B-D INS SYR ULTRAFINE 1CC/30G) 30G X 1/2" 1 ML MISC; Use 4 times a day with insulin Dx E11.9  Dispense: 400 each; Refill: 3   Labs pending Health Maintenance reviewed Diet and exercise encouraged  Follow up plan: 3 months   [name] Hassell Done, FNP

## 2020-07-26 ENCOUNTER — Other Ambulatory Visit: Payer: Self-pay | Admitting: Nurse Practitioner

## 2020-07-26 DIAGNOSIS — E119 Type 2 diabetes mellitus without complications: Secondary | ICD-10-CM

## 2020-07-26 DIAGNOSIS — Z794 Long term (current) use of insulin: Secondary | ICD-10-CM

## 2020-07-26 LAB — CMP14+EGFR
ALT: 7 IU/L (ref 0–44)
AST: 17 IU/L (ref 0–40)
Albumin/Globulin Ratio: 2.1 (ref 1.2–2.2)
Albumin: 4.4 g/dL (ref 3.8–4.8)
Alkaline Phosphatase: 65 IU/L (ref 44–121)
BUN/Creatinine Ratio: 9 — ABNORMAL LOW (ref 10–24)
BUN: 10 mg/dL (ref 8–27)
Bilirubin Total: 0.8 mg/dL (ref 0.0–1.2)
CO2: 23 mmol/L (ref 20–29)
Calcium: 9.7 mg/dL (ref 8.6–10.2)
Chloride: 94 mmol/L — ABNORMAL LOW (ref 96–106)
Creatinine, Ser: 1.12 mg/dL (ref 0.76–1.27)
Globulin, Total: 2.1 g/dL (ref 1.5–4.5)
Glucose: 178 mg/dL — ABNORMAL HIGH (ref 65–99)
Potassium: 4.8 mmol/L (ref 3.5–5.2)
Sodium: 134 mmol/L (ref 134–144)
Total Protein: 6.5 g/dL (ref 6.0–8.5)
eGFR: 74 mL/min/1.73

## 2020-07-26 LAB — CBC WITH DIFFERENTIAL/PLATELET
Basophils Absolute: 0.1 x10E3/uL (ref 0.0–0.2)
Basos: 1 %
EOS (ABSOLUTE): 0 x10E3/uL (ref 0.0–0.4)
Eos: 1 %
Hematocrit: 46.1 % (ref 37.5–51.0)
Hemoglobin: 16.3 g/dL (ref 13.0–17.7)
Immature Grans (Abs): 0 x10E3/uL (ref 0.0–0.1)
Immature Granulocytes: 0 %
Lymphocytes Absolute: 1.5 x10E3/uL (ref 0.7–3.1)
Lymphs: 19 %
MCH: 36.6 pg — ABNORMAL HIGH (ref 26.6–33.0)
MCHC: 35.4 g/dL (ref 31.5–35.7)
MCV: 104 fL — ABNORMAL HIGH (ref 79–97)
Monocytes Absolute: 0.8 x10E3/uL (ref 0.1–0.9)
Monocytes: 10 %
Neutrophils Absolute: 5.4 x10E3/uL (ref 1.4–7.0)
Neutrophils: 69 %
Platelets: 206 x10E3/uL (ref 150–450)
RBC: 4.45 x10E6/uL (ref 4.14–5.80)
RDW: 12.5 % (ref 11.6–15.4)
WBC: 7.8 x10E3/uL (ref 3.4–10.8)

## 2020-07-26 LAB — LIPID PANEL
Chol/HDL Ratio: 2.3 ratio (ref 0.0–5.0)
Cholesterol, Total: 137 mg/dL (ref 100–199)
HDL: 60 mg/dL
LDL Chol Calc (NIH): 55 mg/dL (ref 0–99)
Triglycerides: 127 mg/dL (ref 0–149)
VLDL Cholesterol Cal: 22 mg/dL (ref 5–40)

## 2020-08-22 ENCOUNTER — Other Ambulatory Visit: Payer: Self-pay | Admitting: *Deleted

## 2020-08-22 DIAGNOSIS — E1122 Type 2 diabetes mellitus with diabetic chronic kidney disease: Secondary | ICD-10-CM

## 2020-08-22 MED ORDER — "BD INSULIN SYRINGE U/F 30G X 1/2"" 1 ML MISC": 3 refills | Status: DC

## 2020-09-03 ENCOUNTER — Other Ambulatory Visit: Payer: Self-pay | Admitting: Nurse Practitioner

## 2020-09-03 DIAGNOSIS — Z794 Long term (current) use of insulin: Secondary | ICD-10-CM

## 2020-09-23 ENCOUNTER — Other Ambulatory Visit: Payer: Self-pay | Admitting: Nurse Practitioner

## 2020-09-23 DIAGNOSIS — J418 Mixed simple and mucopurulent chronic bronchitis: Secondary | ICD-10-CM

## 2020-09-24 ENCOUNTER — Other Ambulatory Visit: Payer: Self-pay | Admitting: Family Medicine

## 2020-09-24 DIAGNOSIS — Z794 Long term (current) use of insulin: Secondary | ICD-10-CM

## 2020-09-24 DIAGNOSIS — E119 Type 2 diabetes mellitus without complications: Secondary | ICD-10-CM

## 2020-10-17 ENCOUNTER — Other Ambulatory Visit: Payer: Self-pay | Admitting: Nurse Practitioner

## 2020-10-17 DIAGNOSIS — E119 Type 2 diabetes mellitus without complications: Secondary | ICD-10-CM

## 2020-10-17 DIAGNOSIS — Z794 Long term (current) use of insulin: Secondary | ICD-10-CM

## 2020-10-25 ENCOUNTER — Ambulatory Visit (INDEPENDENT_AMBULATORY_CARE_PROVIDER_SITE_OTHER): Payer: 59 | Admitting: Nurse Practitioner

## 2020-10-25 ENCOUNTER — Encounter: Payer: Self-pay | Admitting: Nurse Practitioner

## 2020-10-25 ENCOUNTER — Other Ambulatory Visit: Payer: Self-pay

## 2020-10-25 VITALS — BP 145/102 | HR 109 | Temp 97.7°F | Resp 20 | Ht 70.0 in | Wt 182.0 lb

## 2020-10-25 DIAGNOSIS — E875 Hyperkalemia: Secondary | ICD-10-CM

## 2020-10-25 DIAGNOSIS — Z794 Long term (current) use of insulin: Secondary | ICD-10-CM

## 2020-10-25 DIAGNOSIS — I10 Essential (primary) hypertension: Secondary | ICD-10-CM | POA: Diagnosis not present

## 2020-10-25 DIAGNOSIS — E119 Type 2 diabetes mellitus without complications: Secondary | ICD-10-CM | POA: Diagnosis not present

## 2020-10-25 DIAGNOSIS — J418 Mixed simple and mucopurulent chronic bronchitis: Secondary | ICD-10-CM

## 2020-10-25 DIAGNOSIS — I4891 Unspecified atrial fibrillation: Secondary | ICD-10-CM

## 2020-10-25 DIAGNOSIS — M545 Low back pain, unspecified: Secondary | ICD-10-CM

## 2020-10-25 DIAGNOSIS — E785 Hyperlipidemia, unspecified: Secondary | ICD-10-CM

## 2020-10-25 DIAGNOSIS — G8929 Other chronic pain: Secondary | ICD-10-CM

## 2020-10-25 DIAGNOSIS — K2100 Gastro-esophageal reflux disease with esophagitis, without bleeding: Secondary | ICD-10-CM

## 2020-10-25 LAB — BAYER DCA HB A1C WAIVED: HB A1C (BAYER DCA - WAIVED): 5.5 % (ref <7.0)

## 2020-10-25 MED ORDER — HYDROCODONE-ACETAMINOPHEN 5-325 MG PO TABS: 1.0000 | ORAL_TABLET | Freq: Two times a day (BID) | ORAL | 0 refills | Status: DC

## 2020-10-25 MED ORDER — FAMOTIDINE 20 MG PO TABS: 20.0000 mg | ORAL_TABLET | Freq: Every morning | ORAL | 5 refills | Status: DC

## 2020-10-25 MED ORDER — HYDROCODONE-ACETAMINOPHEN 5-325 MG PO TABS: 1.0000 | ORAL_TABLET | Freq: Two times a day (BID) | ORAL | 0 refills | Status: AC

## 2020-10-25 MED ORDER — INSULIN GLARGINE 100 UNIT/ML ~~LOC~~ SOLN: SUBCUTANEOUS | 0 refills | Status: DC

## 2020-10-25 MED ORDER — INSULIN ASPART 100 UNIT/ML IJ SOLN: INTRAMUSCULAR | 0 refills | Status: DC

## 2020-10-25 MED ORDER — LISINOPRIL 10 MG PO TABS: 10.0000 mg | ORAL_TABLET | Freq: Every day | ORAL | 3 refills | Status: DC

## 2020-10-25 MED ORDER — TRELEGY ELLIPTA 100-62.5-25 MCG/INH IN AEPB: 1.0000 | INHALATION_SPRAY | Freq: Two times a day (BID) | RESPIRATORY_TRACT | 5 refills | Status: DC

## 2020-10-25 NOTE — Patient Instructions (Signed)

## 2020-10-25 NOTE — Progress Notes (Signed)
Subjective:    Patient ID: Thomas Medici., male    DOB: 07-05-1955, 65 y.o.   MRN: [id number]   Chief Complaint: medical management of chronic issues     HPI:  1. Type 2 diabetes mellitus without complication, with long-term current use of insulin (HCC) He has not been checking his blood sugars everyday. They are usually good, but he could not give me a specific number. Lab Results  Component Value Date   HGBA1C 6.3 07/25/2020     2. Essential hypertension No c/o chest pain, headache or SOB. Does not check blood pressure at home. BP Readings from Last 3 Encounters:  07/25/20 140/85  06/10/20 121/72  05/01/20 (!) 98/57     3. Hyperlipidemia, unspecified hyperlipidemia type Does not watch diet. 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  4. Atrial fibrillation with rapid ventricular response ([place]) He denies any palpitations or heart racing  5. Mixed simple and mucopurulent chronic bronchitis (HCC) Has chronic smokers cough. Is on trelegy daily.  6. Gastroesophageal reflux disease with esophagitis without hemorrhage Takes pepcid as needed  7. Hyperkalemia Denies nay lower ext cramps Lab Results  Component Value Date   K 4.8 07/25/2020     8. Chronic midline low back pain without sciatica Pain assessment: Cause of pain- Mutiple injuries through th eyears Pain location- lower back Pain on scale of 1-10- 6/10 Frequency- daily What increases pain-everything he says What makes pain Better-pain meds help Effects on ADL - none- he pushes hisself to do what he needs to. Any change in general medical condition-none  Current opioids rx- norco 5/325 BID # meds rx- 60 Effectiveness of current meds-helps Adverse reactions from pain meds-none Morphine equivalent- 15MME  Pill count performed-No Last drug screen - 11/03/19 ( high risk q6m moderate risk q621mlow risk yearly ) Urine drug screen today- No Was [place]- yes  If yes were their any  concerning findings? - no   Overdose risk: 1 Opioid Risk  09/16/2018  Alcohol 0  Illegal Drugs 0  Rx Drugs 0  Alcohol 0  Illegal Drugs 0  Rx Drugs 0  Age between 16-45 years  0  History of Preadolescent Sexual Abuse 0  Psychological Disease 0  Depression 0  Opioid Risk Tool Scoring 0  Opioid Risk Interpretation Low Risk     Pain contract signed on: 11/07/19     Outpatient Encounter Medications as of 10/25/2020  Medication Sig   albuterol (PROVENTIL HFA;VENTOLIN HFA) 108 (90 Base) MCG/ACT inhaler Inhale 2 puffs every 6 (six) hours as needed into the lungs for wheezing or shortness of breath.   aspirin 81 MG tablet Take 81 mg by mouth daily.   famotidine (PEPCID) 20 MG tablet Take 1 tablet (20 mg total) by mouth every morning. Reported on 04/16/2015   fluticasone (FLONASE) 50 MCG/ACT nasal spray SPRAY 2 SPRAYS INTO EACH NOSTRIL EVERY DAY   HYDROcodone-acetaminophen (NORCO/VICODIN) 5-325 MG tablet Take 1 tablet by mouth in the morning and at bedtime.   insulin glargine (LANTUS) 100 UNIT/ML injection INJECT 0.4 MLS (40 UNITS TOTAL) INTO THE SKIN IN THE MORNING.   Insulin Syringe-Needle U-100 (B-D INS SYR ULTRAFINE 1CC/30G) 30G X 1/2" 1 ML MISC Use 4 times a day with insulin Dx E11.9   Insulin Syringes, Disposable, U-100 1 ML MISC Use 4 times a day for insulin injection Dx E11.9   lisinopril (ZESTRIL) 10 MG tablet Take 1 tablet (10 mg total) by mouth  daily.   loratadine (CLARITIN) 10 MG tablet Take 10 mg by mouth at bedtime.   NOVOLOG 100 UNIT/ML injection PER SLIDING SCALE: 190 - 200 = 2 UNITS 300 AND ABOVE = 7 UNITS   TRELEGY ELLIPTA 100-62.5-25 MCG/INH AEPB INHALE 2 PUFFS BY MOUTH INTO THE LUNGS DAILY   No facility-administered encounter medications on file as of 10/25/2020.    Past Surgical History:  Procedure Laterality Date   CYSTOSCOPY WITH URETEROSCOPY Right 08/14/2013   Procedure: CYSTOSCOPY WITH URETEROSCOPY BLADDER BIOPSY ;  Surgeon: Claybon Jabs, MD;  Location: [facility];  Service: Urology;  Laterality: Right;   ESOPHAGOGASTRODUODENOSCOPY N/A 02/06/2013   Procedure: ESOPHAGOGASTRODUODENOSCOPY (EGD);  Surgeon: Lafayette Dragon, MD;  Location[facility] ENDOSCOPY;  Service: Endoscopy;  Laterality: N/A;   LAPAROSCOPY N/A 11/25/2014   Procedure: LAPAROSCOPIC PRIMARY REPAIR OF PERFORATED PREPYLORIC ULCER WITH Silvestre Gunner;  Surgeon: Greer Pickerel, MD;  Location: [place];  Service: General;  Laterality: N/A;   TONSILLECTOMY  as child   TRANSTHORACIC ECHOCARDIOGRAM  02-17-2011   MODERATE LVH/  EF 65%   TRANSURETHRAL RESECTION OF BLADDER TUMOR WITH GYRUS (TURBT-GYRUS) N/A 06/12/2013   Procedure: TRANSURETHRAL RESECTION OF BLADDER TUMOR WITH GYRUS (TURBT-GYRUS);  Surgeon: Claybon Jabs, MD;  Location: [facility];  Service: Urology;  Laterality: N/A;   TYMPANIC MEMBRANE REPAIR  as child    Family History  Problem Relation Age of Onset   Breast cancer Mother    Cancer Mother        Breast   Rheumatic fever Father    Heart disease Father    Heart attack Father        Massive    Diabetes Son     New complaints: None today  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  Eyes:  Negative for pain.  Respiratory:  Positive for shortness of breath (chronic).   Cardiovascular:  Negative for chest pain, palpitations and leg swelling.  Gastrointestinal:  Negative for abdominal pain.  Endocrine: Negative for polydipsia.  Musculoskeletal:  Positive for back pain (chronic).  Skin:  Negative for rash.  Neurological:  Negative for dizziness, weakness and headaches.  Hematological:  Does not bruise/bleed easily.  All other systems reviewed and are negative.     Objective:   Physical Exam Vitals and nursing note reviewed.  Constitutional:      Appearance: Normal appearance. He is well-developed.  HENT:     Head: Normocephalic.     Nose: Nose normal.  Eyes:     Pupils: Pupils  are equal, round, and reactive to light.  Neck:     Thyroid: No thyroid mass or thyromegaly.     Vascular: No carotid bruit or JVD.     Trachea: Phonation normal.  Cardiovascular:     Rate and Rhythm: Normal rate and regular rhythm.  Pulmonary:     Effort: Pulmonary effort is normal. No respiratory distress.     Breath sounds: Rhonchi and rales present.     Comments: breathing labored Abdominal:     General: Bowel sounds are normal.     Palpations: Abdomen is soft.     Tenderness: There is no abdominal tenderness.  Musculoskeletal:        General: Normal range of motion.     Cervical back: Normal range of motion and neck supple.     Comments: Gait slow and steady (- )SLR bil  Lymphadenopathy:     Cervical: No cervical adenopathy.  Skin:    General: Skin is warm and dry.  Neurological:     Mental Status: He is alert and oriented to person, place, and time.  Psychiatric:        Behavior: Behavior normal.        Thought Content: Thought content normal.        Judgment: Judgment normal.    BP (!) 145/102   Pulse (!) 109   Temp 97.7 F (36.5 C) (Temporal)   Resp 20   Ht _0  (1.778 m)   Wt 182 lb (82.6 kg)   SpO2 97%   BMI 26.11 kg/m   Hgba1c 5.5     Assessment & Plan:  Thomas Medici. comes in today with chief complaint of Medical Management of Chronic Issues   Diagnosis and orders addressed:  1. Type 2 diabetes mellitus without complication, with long-term current use of insulin (HCC) Watch carbs in diet - Bayer DCA Hb A1c Waived - insulin glargine (LANTUS) 100 UNIT/ML injection; INJECT 0.4 MLS (40 UNITS TOTAL) INTO THE SKIN IN THE MORNING.  Dispense: 10 mL; Refill: 0 - insulin aspart (NOVOLOG) 100 UNIT/ML injection; PER SLIDING SCALE: 190 - 200 = 2 UNITS 300 AND ABOVE = 7 UNITS  Dispense: 10 mL; Refill: 0  2. Essential hypertension Low sodium diet - CBC with Differential/Platelet - CMP14+EGFR - lisinopril (ZESTRIL) 10 MG tablet; Take 1 tablet (10 mg  total) by mouth daily.  Dispense: 90 tablet; Refill: 3  3. Hyperlipidemia, unspecified hyperlipidemia type Low fat diet - Lipid panel  4. Atrial fibrillation with rapid ventricular response ([place]) Labs pending  5. Mixed simple and mucopurulent chronic bronchitis ([place]) Refuses to quit smoking - Fluticasone-Umeclidin-Vilant (TRELEGY ELLIPTA) 100-62.5-25 MCG/INH AEPB; Inhale 1 puff into the lungs 2 (two) times daily.  Dispense: 60 each; Refill: 5  6. Gastroesophageal reflux disease with esophagitis without hemorrhage Avoid spicy foods Do not eat 2 hours prior to bedtime - famotidine (PEPCID) 20 MG tablet; Take 1 tablet (20 mg total) by mouth every morning. Reported on 04/16/2015  Dispense: 30 tablet; Refill: 5  7. Hyperkalemia Labs pending  8. Chronic midline low back pain without sciatica Moist heat rest - HYDROcodone-acetaminophen (NORCO/VICODIN) 5-325 MG tablet; Take 1 tablet by mouth in the morning and at bedtime.  Dispense: 60 tablet; Refill: 0 - HYDROcodone-acetaminophen (NORCO/VICODIN) 5-325 MG tablet; Take 1 tablet by mouth in the morning and at bedtime.  Dispense: 60 tablet; Refill: 0 - HYDROcodone-acetaminophen (NORCO/VICODIN) 5-325 MG tablet; Take 1 tablet by mouth in the morning and at bedtime.  Dispense: 60 tablet; Refill: 0   Labs pending Health Maintenance reviewed Diet and exercise encouraged  Follow up plan: 3 months   [name] Hassell Done, FNP

## 2020-10-26 LAB — CBC WITH DIFFERENTIAL/PLATELET
Basophils Absolute: 0.1 10*3/uL (ref 0.0–0.2)
Basos: 1 %
EOS (ABSOLUTE): 0.1 10*3/uL (ref 0.0–0.4)
Eos: 2 %
Hematocrit: 41.9 % (ref 37.5–51.0)
Hemoglobin: 14.9 g/dL (ref 13.0–17.7)
Immature Grans (Abs): 0 10*3/uL (ref 0.0–0.1)
Immature Granulocytes: 1 %
Lymphocytes Absolute: 1.7 10*3/uL (ref 0.7–3.1)
Lymphs: 25 %
MCH: 36.9 pg — ABNORMAL HIGH (ref 26.6–33.0)
MCHC: 35.6 g/dL (ref 31.5–35.7)
MCV: 104 fL — ABNORMAL HIGH (ref 79–97)
Monocytes Absolute: 0.7 10*3/uL (ref 0.1–0.9)
Monocytes: 10 %
Neutrophils Absolute: 4.1 10*3/uL (ref 1.4–7.0)
Neutrophils: 61 %
Platelets: 189 10*3/uL (ref 150–450)
RBC: 4.04 x10E6/uL — ABNORMAL LOW (ref 4.14–5.80)
RDW: 12 % (ref 11.6–15.4)
WBC: 6.7 10*3/uL (ref 3.4–10.8)

## 2020-10-26 LAB — LIPID PANEL
Chol/HDL Ratio: 2.2 ratio (ref 0.0–5.0)
Cholesterol, Total: 121 mg/dL (ref 100–199)
HDL: 54 mg/dL (ref >39)
LDL Chol Calc (NIH): 43 mg/dL (ref 0–99)
Triglycerides: 143 mg/dL (ref 0–149)
VLDL Cholesterol Cal: 24 mg/dL (ref 5–40)

## 2020-10-26 LAB — CMP14+EGFR
ALT: 8 IU/L (ref 0–44)
AST: 20 IU/L (ref 0–40)
Albumin/Globulin Ratio: 1.7 (ref 1.2–2.2)
Albumin: 3.9 g/dL (ref 3.8–4.8)
Alkaline Phosphatase: 64 IU/L (ref 44–121)
BUN/Creatinine Ratio: 7 — ABNORMAL LOW (ref 10–24)
BUN: 7 mg/dL — ABNORMAL LOW (ref 8–27)
Bilirubin Total: 0.8 mg/dL (ref 0.0–1.2)
CO2: 23 mmol/L (ref 20–29)
Calcium: 8.9 mg/dL (ref 8.6–10.2)
Chloride: 97 mmol/L (ref 96–106)
Creatinine, Ser: 1.06 mg/dL (ref 0.76–1.27)
Globulin, Total: 2.3 g/dL (ref 1.5–4.5)
Glucose: 143 mg/dL — ABNORMAL HIGH (ref 65–99)
Potassium: 4.1 mmol/L (ref 3.5–5.2)
Sodium: 134 mmol/L (ref 134–144)
Total Protein: 6.2 g/dL (ref 6.0–8.5)
eGFR: 79 mL/min/{1.73_m2} (ref >59)

## 2020-11-18 ENCOUNTER — Telehealth: Payer: Self-pay | Admitting: Nurse Practitioner

## 2020-11-18 ENCOUNTER — Other Ambulatory Visit: Payer: Self-pay | Admitting: Nurse Practitioner

## 2020-11-18 MED ORDER — DOXYCYCLINE HYCLATE 100 MG PO TABS: 100.0000 mg | ORAL_TABLET | Freq: Two times a day (BID) | ORAL | 0 refills | Status: DC

## 2020-11-18 NOTE — Progress Notes (Signed)
Abces on chest wall again Meds ordered this encounter  Medications   doxycycline (VIBRA-TABS) 100 MG tablet    Sig: Take 1 tablet (100 mg total) by mouth 2 (two) times daily. 1 po bid    Dispense:  20 tablet    Refill:  0    Order Specific Question:   Supervising Provider    Answer:   Noemi Chapel [[id number]]

## 2020-11-18 NOTE — Telephone Encounter (Signed)
Patient requesting antibiotic be sent to pharmacy for area on chest that keeps coming back. Was prescribed antibiotic at last visit and states that it probably needs to be opened but doesn't want to come in until his scheduled appt. Antibiotic sent to pharmacy per MMM and patient notified

## 2020-11-23 ENCOUNTER — Other Ambulatory Visit: Payer: Self-pay | Admitting: Nurse Practitioner

## 2020-11-23 DIAGNOSIS — Z794 Long term (current) use of insulin: Secondary | ICD-10-CM

## 2020-11-23 DIAGNOSIS — E119 Type 2 diabetes mellitus without complications: Secondary | ICD-10-CM

## 2020-11-30 ENCOUNTER — Other Ambulatory Visit: Payer: Self-pay | Admitting: Nurse Practitioner

## 2020-11-30 DIAGNOSIS — Z794 Long term (current) use of insulin: Secondary | ICD-10-CM

## 2020-11-30 DIAGNOSIS — E119 Type 2 diabetes mellitus without complications: Secondary | ICD-10-CM

## 2020-12-01 ENCOUNTER — Other Ambulatory Visit: Payer: Self-pay | Admitting: Nurse Practitioner

## 2020-12-01 DIAGNOSIS — Z794 Long term (current) use of insulin: Secondary | ICD-10-CM

## 2020-12-01 DIAGNOSIS — E119 Type 2 diabetes mellitus without complications: Secondary | ICD-10-CM

## 2020-12-02 ENCOUNTER — Other Ambulatory Visit: Payer: Self-pay

## 2020-12-02 ENCOUNTER — Emergency Department (HOSPITAL_COMMUNITY): Payer: 59

## 2020-12-02 ENCOUNTER — Inpatient Hospital Stay (HOSPITAL_COMMUNITY)
Admission: EM | Admit: 2020-12-02 | Discharge: 2020-12-06 | DRG: 438 | Discharge status: Against Medical Advice | Payer: 59 | Attending: Pulmonary Disease | Admitting: Pulmonary Disease

## 2020-12-02 DIAGNOSIS — I4892 Unspecified atrial flutter: Secondary | ICD-10-CM | POA: Diagnosis present

## 2020-12-02 DIAGNOSIS — E11319 Type 2 diabetes mellitus with unspecified diabetic retinopathy without macular edema: Secondary | ICD-10-CM | POA: Diagnosis present

## 2020-12-02 DIAGNOSIS — K852 Alcohol induced acute pancreatitis without necrosis or infection: Secondary | ICD-10-CM | POA: Diagnosis present

## 2020-12-02 DIAGNOSIS — K859 Acute pancreatitis without necrosis or infection, unspecified: Secondary | ICD-10-CM | POA: Diagnosis present

## 2020-12-02 DIAGNOSIS — Z8249 Family history of ischemic heart disease and other diseases of the circulatory system: Secondary | ICD-10-CM

## 2020-12-02 DIAGNOSIS — Z5329 Procedure and treatment not carried out because of patient's decision for other reasons: Secondary | ICD-10-CM | POA: Diagnosis present

## 2020-12-02 DIAGNOSIS — I483 Typical atrial flutter: Secondary | ICD-10-CM | POA: Diagnosis not present

## 2020-12-02 DIAGNOSIS — D6959 Other secondary thrombocytopenia: Secondary | ICD-10-CM | POA: Diagnosis present

## 2020-12-02 DIAGNOSIS — R269 Unspecified abnormalities of gait and mobility: Secondary | ICD-10-CM | POA: Diagnosis present

## 2020-12-02 DIAGNOSIS — J449 Chronic obstructive pulmonary disease, unspecified: Secondary | ICD-10-CM | POA: Diagnosis present

## 2020-12-02 DIAGNOSIS — F10231 Alcohol dependence with withdrawal delirium: Secondary | ICD-10-CM | POA: Diagnosis present

## 2020-12-02 DIAGNOSIS — I480 Paroxysmal atrial fibrillation: Secondary | ICD-10-CM | POA: Diagnosis present

## 2020-12-02 DIAGNOSIS — E871 Hypo-osmolality and hyponatremia: Secondary | ICD-10-CM | POA: Diagnosis present

## 2020-12-02 DIAGNOSIS — E785 Hyperlipidemia, unspecified: Secondary | ICD-10-CM | POA: Diagnosis present

## 2020-12-02 DIAGNOSIS — G8929 Other chronic pain: Secondary | ICD-10-CM | POA: Diagnosis present

## 2020-12-02 DIAGNOSIS — I426 Alcoholic cardiomyopathy: Secondary | ICD-10-CM | POA: Diagnosis present

## 2020-12-02 DIAGNOSIS — G9341 Metabolic encephalopathy: Secondary | ICD-10-CM | POA: Diagnosis not present

## 2020-12-02 DIAGNOSIS — F17210 Nicotine dependence, cigarettes, uncomplicated: Secondary | ICD-10-CM | POA: Diagnosis present

## 2020-12-02 DIAGNOSIS — I739 Peripheral vascular disease, unspecified: Secondary | ICD-10-CM | POA: Diagnosis present

## 2020-12-02 DIAGNOSIS — I5021 Acute systolic (congestive) heart failure: Secondary | ICD-10-CM | POA: Diagnosis present

## 2020-12-02 DIAGNOSIS — R296 Repeated falls: Secondary | ICD-10-CM | POA: Diagnosis present

## 2020-12-02 DIAGNOSIS — Z20822 Contact with and (suspected) exposure to covid-19: Secondary | ICD-10-CM | POA: Diagnosis present

## 2020-12-02 DIAGNOSIS — Z8551 Personal history of malignant neoplasm of bladder: Secondary | ICD-10-CM | POA: Diagnosis not present

## 2020-12-02 DIAGNOSIS — R1084 Generalized abdominal pain: Secondary | ICD-10-CM

## 2020-12-02 DIAGNOSIS — Z888 Allergy status to other drugs, medicaments and biological substances status: Secondary | ICD-10-CM

## 2020-12-02 DIAGNOSIS — N179 Acute kidney failure, unspecified: Secondary | ICD-10-CM | POA: Diagnosis present

## 2020-12-02 DIAGNOSIS — J439 Emphysema, unspecified: Secondary | ICD-10-CM

## 2020-12-02 DIAGNOSIS — E861 Hypovolemia: Secondary | ICD-10-CM | POA: Diagnosis present

## 2020-12-02 DIAGNOSIS — K2980 Duodenitis without bleeding: Secondary | ICD-10-CM | POA: Diagnosis present

## 2020-12-02 DIAGNOSIS — R7989 Other specified abnormal findings of blood chemistry: Secondary | ICD-10-CM | POA: Diagnosis present

## 2020-12-02 DIAGNOSIS — F1020 Alcohol dependence, uncomplicated: Secondary | ICD-10-CM | POA: Diagnosis not present

## 2020-12-02 DIAGNOSIS — Z7982 Long term (current) use of aspirin: Secondary | ICD-10-CM

## 2020-12-02 DIAGNOSIS — Z794 Long term (current) use of insulin: Secondary | ICD-10-CM

## 2020-12-02 DIAGNOSIS — F10239 Alcohol dependence with withdrawal, unspecified: Secondary | ICD-10-CM | POA: Diagnosis not present

## 2020-12-02 DIAGNOSIS — E1165 Type 2 diabetes mellitus with hyperglycemia: Secondary | ICD-10-CM

## 2020-12-02 DIAGNOSIS — R069 Unspecified abnormalities of breathing: Secondary | ICD-10-CM

## 2020-12-02 DIAGNOSIS — Z833 Family history of diabetes mellitus: Secondary | ICD-10-CM

## 2020-12-02 DIAGNOSIS — Z79891 Long term (current) use of opiate analgesic: Secondary | ICD-10-CM

## 2020-12-02 DIAGNOSIS — K219 Gastro-esophageal reflux disease without esophagitis: Secondary | ICD-10-CM | POA: Diagnosis present

## 2020-12-02 DIAGNOSIS — I110 Hypertensive heart disease with heart failure: Secondary | ICD-10-CM | POA: Diagnosis present

## 2020-12-02 DIAGNOSIS — I484 Atypical atrial flutter: Secondary | ICD-10-CM

## 2020-12-02 DIAGNOSIS — J4489 Other specified chronic obstructive pulmonary disease: Secondary | ICD-10-CM

## 2020-12-02 DIAGNOSIS — M545 Low back pain, unspecified: Secondary | ICD-10-CM | POA: Diagnosis present

## 2020-12-02 DIAGNOSIS — Z8711 Personal history of peptic ulcer disease: Secondary | ICD-10-CM

## 2020-12-02 DIAGNOSIS — Z881 Allergy status to other antibiotic agents status: Secondary | ICD-10-CM

## 2020-12-02 DIAGNOSIS — Z803 Family history of malignant neoplasm of breast: Secondary | ICD-10-CM

## 2020-12-02 DIAGNOSIS — Z79899 Other long term (current) drug therapy: Secondary | ICD-10-CM

## 2020-12-02 DIAGNOSIS — Z7951 Long term (current) use of inhaled steroids: Secondary | ICD-10-CM

## 2020-12-02 LAB — CBC WITH DIFFERENTIAL/PLATELET
Abs Immature Granulocytes: 0.04 10*3/uL (ref 0.00–0.07)
Basophils Absolute: 0 10*3/uL (ref 0.0–0.1)
Basophils Relative: 0 %
Eosinophils Absolute: 0 10*3/uL (ref 0.0–0.5)
Eosinophils Relative: 0 %
HCT: 46.4 % (ref 39.0–52.0)
Hemoglobin: 16.5 g/dL (ref 13.0–17.0)
Immature Granulocytes: 1 %
Lymphocytes Relative: 2 %
Lymphs Abs: 0.2 10*3/uL — ABNORMAL LOW (ref 0.7–4.0)
MCH: 36.6 pg — ABNORMAL HIGH (ref 26.0–34.0)
MCHC: 35.6 g/dL (ref 30.0–36.0)
MCV: 102.9 fL — ABNORMAL HIGH (ref 80.0–100.0)
Monocytes Absolute: 0.5 10*3/uL (ref 0.1–1.0)
Monocytes Relative: 6 %
Neutro Abs: 7.5 10*3/uL (ref 1.7–7.7)
Neutrophils Relative %: 91 %
Platelets: 186 10*3/uL (ref 150–400)
RBC: 4.51 MIL/uL (ref 4.22–5.81)
RDW: 12 % (ref 11.5–15.5)
WBC: 8.2 10*3/uL (ref 4.0–10.5)
nRBC: 0 % (ref 0.0–0.2)

## 2020-12-02 LAB — URINALYSIS, ROUTINE W REFLEX MICROSCOPIC
Bacteria, UA: NONE SEEN
Bilirubin Urine: NEGATIVE
Glucose, UA: >=500 mg/dL — AB
Ketones, ur: 80 mg/dL — AB
Leukocytes,Ua: NEGATIVE
Nitrite: NEGATIVE
Protein, ur: 100 mg/dL — AB
Specific Gravity, Urine: 1.025 (ref 1.005–1.030)
pH: 5 (ref 5.0–8.0)

## 2020-12-02 LAB — COMPREHENSIVE METABOLIC PANEL
ALT: 29 U/L (ref 0–44)
AST: 44 U/L — ABNORMAL HIGH (ref 15–41)
Albumin: 3.8 g/dL (ref 3.5–5.0)
Alkaline Phosphatase: 61 U/L (ref 38–126)
Anion gap: 17 — ABNORMAL HIGH (ref 5–15)
BUN: 19 mg/dL (ref 8–23)
CO2: 22 mmol/L (ref 22–32)
Calcium: 9.7 mg/dL (ref 8.9–10.3)
Chloride: 92 mmol/L — ABNORMAL LOW (ref 98–111)
Creatinine, Ser: 1.22 mg/dL (ref 0.61–1.24)
GFR, Estimated: >60 mL/min (ref >60)
Glucose, Bld: 193 mg/dL — ABNORMAL HIGH (ref 70–99)
Potassium: 4.3 mmol/L (ref 3.5–5.1)
Sodium: 131 mmol/L — ABNORMAL LOW (ref 135–145)
Total Bilirubin: 4.3 mg/dL — ABNORMAL HIGH (ref 0.3–1.2)
Total Protein: 7.3 g/dL (ref 6.5–8.1)

## 2020-12-02 LAB — RESP PANEL BY RT-PCR (FLU A&B, COVID) ARPGX2
Influenza A by PCR: NEGATIVE
Influenza B by PCR: NEGATIVE
SARS Coronavirus 2 by RT PCR: NEGATIVE

## 2020-12-02 LAB — LACTIC ACID, PLASMA: Lactic Acid, Venous: 1.9 mmol/L (ref 0.5–1.9)

## 2020-12-02 LAB — TROPONIN I (HIGH SENSITIVITY): Troponin I (High Sensitivity): 15 ng/L (ref <18)

## 2020-12-02 LAB — CBG MONITORING, ED: Glucose-Capillary: 343 mg/dL — ABNORMAL HIGH (ref 70–99)

## 2020-12-02 LAB — LIPASE, BLOOD: Lipase: 317 U/L — ABNORMAL HIGH (ref 11–51)

## 2020-12-02 MED ORDER — LORAZEPAM 1 MG PO TABS
1.0000 mg | ORAL_TABLET | ORAL | Status: DC | PRN
  Administered 2020-12-03: 2 mg via ORAL
  Filled 2020-12-02: qty 2

## 2020-12-02 MED ORDER — ACETAMINOPHEN 325 MG PO TABS
650.0000 mg | ORAL_TABLET | Freq: Four times a day (QID) | ORAL | Status: DC | PRN
  Filled 2020-12-02: qty 2

## 2020-12-02 MED ORDER — PROCHLORPERAZINE EDISYLATE 10 MG/2ML IJ SOLN
5.0000 mg | Freq: Four times a day (QID) | INTRAMUSCULAR | Status: DC | PRN
  Filled 2020-12-02: qty 1

## 2020-12-02 MED ORDER — THIAMINE HCL 100 MG/ML IJ SOLN
100.0000 mg | Freq: Every day | INTRAMUSCULAR | Status: DC
  Administered 2020-12-04: 100 mg via INTRAVENOUS
  Filled 2020-12-02 (×2): qty 2

## 2020-12-02 MED ORDER — SODIUM CHLORIDE 0.9 % IV BOLUS
1000.0000 mL | Freq: Once | INTRAVENOUS | Status: AC
  Administered 2020-12-02: 1000 mL via INTRAVENOUS

## 2020-12-02 MED ORDER — FOLIC ACID 1 MG PO TABS
1.0000 mg | ORAL_TABLET | Freq: Every day | ORAL | Status: DC
  Administered 2020-12-02 – 2020-12-03 (×2): 1 mg via ORAL
  Filled 2020-12-02 (×2): qty 1

## 2020-12-02 MED ORDER — INSULIN ASPART 100 UNIT/ML IJ SOLN
0.0000 [IU] | INTRAMUSCULAR | Status: DC
  Administered 2020-12-02: 9 [IU] via SUBCUTANEOUS
  Administered 2020-12-03: 7 [IU] via SUBCUTANEOUS
  Administered 2020-12-03: 5 [IU] via SUBCUTANEOUS
  Administered 2020-12-03: 3 [IU] via SUBCUTANEOUS
  Administered 2020-12-03: 2 [IU] via SUBCUTANEOUS
  Administered 2020-12-04: 3 [IU] via SUBCUTANEOUS

## 2020-12-02 MED ORDER — DILTIAZEM HCL-DEXTROSE 125-5 MG/125ML-% IV SOLN (PREMIX)
5.0000 mg/h | INTRAVENOUS | Status: DC
  Administered 2020-12-02: 5 mg/h via INTRAVENOUS
  Filled 2020-12-02: qty 125

## 2020-12-02 MED ORDER — IOHEXOL 300 MG/ML  SOLN
100.0000 mL | Freq: Once | INTRAMUSCULAR | Status: AC | PRN
  Administered 2020-12-02: 100 mL via INTRAVENOUS

## 2020-12-02 MED ORDER — METOPROLOL TARTRATE 5 MG/5ML IV SOLN
2.5000 mg | Freq: Four times a day (QID) | INTRAVENOUS | Status: DC | PRN
  Administered 2020-12-02: 2.5 mg via INTRAVENOUS
  Filled 2020-12-02: qty 5

## 2020-12-02 MED ORDER — SODIUM CHLORIDE 0.9 % IV BOLUS
1000.0000 mL | INTRAVENOUS | Status: AC
  Administered 2020-12-02: 1000 mL via INTRAVENOUS

## 2020-12-02 MED ORDER — LACTATED RINGERS IV SOLN: INTRAVENOUS | Status: DC

## 2020-12-02 MED ORDER — LORAZEPAM 2 MG/ML IJ SOLN
1.0000 mg | Freq: Once | INTRAMUSCULAR | Status: AC
  Administered 2020-12-02: 1 mg via INTRAVENOUS
  Filled 2020-12-02: qty 1

## 2020-12-02 MED ORDER — THIAMINE HCL 100 MG PO TABS
100.0000 mg | ORAL_TABLET | Freq: Every day | ORAL | Status: DC
  Administered 2020-12-02 – 2020-12-06 (×4): 100 mg via ORAL
  Filled 2020-12-02 (×4): qty 1

## 2020-12-02 MED ORDER — FENTANYL CITRATE PF 50 MCG/ML IJ SOSY
75.0000 ug | PREFILLED_SYRINGE | INTRAMUSCULAR | Status: AC | PRN
  Administered 2020-12-02 – 2020-12-03 (×2): 75 ug via INTRAVENOUS
  Filled 2020-12-02 (×2): qty 2

## 2020-12-02 MED ORDER — ENOXAPARIN SODIUM 40 MG/0.4ML IJ SOSY
40.0000 mg | PREFILLED_SYRINGE | INTRAMUSCULAR | Status: DC
  Administered 2020-12-03 – 2020-12-05 (×4): 40 mg via SUBCUTANEOUS
  Filled 2020-12-02 (×4): qty 0.4

## 2020-12-02 MED ORDER — ADULT MULTIVITAMIN W/MINERALS CH
1.0000 | ORAL_TABLET | Freq: Every day | ORAL | Status: DC
  Administered 2020-12-03 – 2020-12-06 (×3): 1 via ORAL
  Filled 2020-12-02 (×3): qty 1

## 2020-12-02 MED ORDER — POLYETHYLENE GLYCOL 3350 17 G PO PACK: 17.0000 g | PACK | Freq: Every day | ORAL | Status: DC | PRN

## 2020-12-02 MED ORDER — METOPROLOL TARTRATE 12.5 MG HALF TABLET
12.5000 mg | ORAL_TABLET | Freq: Two times a day (BID) | ORAL | Status: DC
  Administered 2020-12-03 – 2020-12-06 (×6): 12.5 mg via ORAL
  Filled 2020-12-02 (×6): qty 1

## 2020-12-02 MED ORDER — HYDROMORPHONE HCL 1 MG/ML IJ SOLN
0.5000 mg | Freq: Once | INTRAMUSCULAR | Status: DC
Start: 2020-12-02 — End: 2020-12-02

## 2020-12-02 MED ORDER — LORAZEPAM 2 MG/ML IJ SOLN
1.0000 mg | INTRAMUSCULAR | Status: DC | PRN
  Administered 2020-12-04 (×2): 4 mg via INTRAVENOUS
  Administered 2020-12-04: 2 mg via INTRAVENOUS
  Filled 2020-12-02 (×2): qty 2
  Filled 2020-12-02: qty 1

## 2020-12-02 MED ORDER — HYDROMORPHONE HCL 1 MG/ML IJ SOLN
0.5000 mg | Freq: Once | INTRAMUSCULAR | Status: AC
Start: 2020-12-02 — End: 2020-12-02
  Administered 2020-12-02: 0.5 mg via INTRAVENOUS
  Filled 2020-12-02: qty 1

## 2020-12-02 MED ORDER — ONDANSETRON HCL 4 MG/2ML IJ SOLN
4.0000 mg | Freq: Once | INTRAMUSCULAR | Status: AC
  Administered 2020-12-02: 4 mg via INTRAVENOUS
  Filled 2020-12-02: qty 2

## 2020-12-02 MED ORDER — MELATONIN 3 MG PO TABS
3.0000 mg | ORAL_TABLET | Freq: Every evening | ORAL | Status: DC | PRN
  Administered 2020-12-03: 3 mg via ORAL
  Filled 2020-12-02: qty 1

## 2020-12-02 NOTE — ED Provider Notes (Signed)
Emergency Medicine Provider Triage Evaluation Note  Michai Dieppa. , a 65 y.o. male  was evaluated in triage.  Pt complains of abd pain nv. States his pain feels like when he had a perforated ulcer in the past.  Review of Systems  Positive: Abd pain, nv Negative: fever  Physical Exam  There were no vitals taken for this visit. Gen:   Awake, no distress   Resp:  Normal effort  MSK:   Moves extremities without difficulty  Other:  Abd is diffusely tender, pt tachypneic  Medical Decision Making  Medically screening exam initiated at 2:00 PM.  Appropriate orders placed.  [name] was informed that the remainder of the evaluation will be completed by another provider, this initial triage assessment does not replace that evaluation, and the importance of remaining in the ED until their evaluation is complete.     Rodney Booze, [place] 12/02/20 1401    Lucrezia Starch, MD 12/03/20 [phone]

## 2020-12-02 NOTE — ED Triage Notes (Signed)
Pt with n/v x 2 days and today developed severe abdominal pain and shob. Pt appears very uncomfortable in triage. Hx of ruptured ulcer.

## 2020-12-02 NOTE — ED Notes (Signed)
Pt placed on hospital bed for comfort, appears to be less agitated.

## 2020-12-02 NOTE — H&P (Addendum)
History and Physical  Thomas Sandoval. DQQ:[id number] DOB: [birth year] DOA: 12/02/2020  Referring physician: Benay Spice  PCP: Chevis Pretty, [place]  Outpatient Specialists: Vascular surgery. Patient coming from: Home.  Chief Complaint: Severe abdominal pain, nausea and vomiting.  HPI: Thomas Sandoval. is a 65 y.o. male with medical history significant for alcohol abuse, history of perforated ulcer requiring surgery in 2016, paroxysmal A. fib not on oral anticoagulation, type 2 diabetes, mitral valve prolapse, COPD, hyperlipidemia, essential hypertension, GERD, peripheral vascular disease who presented to [facility] ED due to worsening abdominal pain for the past 2 days, which became severe on the day of presentation.  Associated with nausea and vomiting x2 days.  His abdominal pain was so severe that he was finding it difficult to catch his breath.  Presented to the ED for further evaluation.  While in the ED, he is found to be in A. fib/a flutter with RVR.  Lipase level elevated 317, evidence of acute pancreatitis on CT scan.  Started on Cardizem drip, received IV fluid hydration 1 L normal saline bolus, and IV analgesics in the ED.  TRH, hospitalist team, was asked to admit.  ED Course: Temperature 98.3.  BP 120/92, heart rate 157, respiratory 21, oxygen saturation 97% on room air.  Lab studies remarkable for serum sodium 131, glucose 193, bicarb 22, BUN 19, creatinine 1.22, anion gap 17.  Lipase 317.  AST 44, total bilirubin 4.3.  Troponin 15.  CBC essentially unremarkable.  Review of Systems: Review of systems as noted in the HPI. All other systems reviewed and are negative.   Past Medical History:  Diagnosis Date   Arthritis    Bladder cancer ([place])    Chronic back pain    COPD (chronic obstructive pulmonary disease) (HCC)    DDD (degenerative disc disease)    Diabetic retinopathy of both eyes (HCC)    Essential hypertension    GERD (gastroesophageal reflux disease)     History of atrial flutter 02/2011   Converted to NSR with Cardizem   History of chronic bronchitis    History of hemolytic anemia 02/2011   secondary to Avelox   HOH (hard of hearing)    HOH (hard of hearing)    no eardrum and nerve damage on R, also HOH on L   Mitral valve prolapse    a. 2D Echo 11/27/14: EF 55-60%; images were inadequate for LV wall motion assessment, + mild late systolic mitral valve prolapse involving the anterior leaflet.   PAD (peripheral artery disease) ([place]) 04/2014   Dr Trula Slade; bilateral SFA occlusion, R mid, L distal   PAF (paroxysmal atrial fibrillation) ([place])    a. Dx 11/2014 during admission for perf ulcer.   Perforated ulcer ([place])    a. 11/2014 s/p surgery.   Productive cough    Smokers' cough ([place])    Type 1 diabetes mellitus ([place]) 1977   Past Surgical History:  Procedure Laterality Date   CYSTOSCOPY WITH URETEROSCOPY Right 08/14/2013   Procedure: CYSTOSCOPY WITH URETEROSCOPY BLADDER BIOPSY ;  Surgeon: Claybon Jabs, MD;  Location[facility];  Service: Urology;  Laterality: Right;   ESOPHAGOGASTRODUODENOSCOPY N/A 02/06/2013   Procedure: ESOPHAGOGASTRODUODENOSCOPY (EGD);  Surgeon: Lafayette Dragon, MD;  Location: [facility] ENDOSCOPY;  Service: Endoscopy;  Laterality: N/A;   LAPAROSCOPY N/A 11/25/2014   Procedure: LAPAROSCOPIC PRIMARY REPAIR OF PERFORATED PREPYLORIC ULCER WITH Silvestre Gunner;  Surgeon: Greer Pickerel, MD;  Location: [place];  Service: General;  Laterality: N/A;  TONSILLECTOMY  as child   TRANSTHORACIC ECHOCARDIOGRAM  02-17-2011   MODERATE LVH/  EF 65%   TRANSURETHRAL RESECTION OF BLADDER TUMOR WITH GYRUS (TURBT-GYRUS) N/A 06/12/2013   Procedure: TRANSURETHRAL RESECTION OF BLADDER TUMOR WITH GYRUS (TURBT-GYRUS);  Surgeon: Claybon Jabs, MD;  Location: [facility];  Service: Urology;  Laterality: N/A;   TYMPANIC MEMBRANE REPAIR  as child    Social History:  reports that he has been smoking cigarettes. He has a 60.00 pack-year  smoking history. He quit smokeless tobacco use about 42 years ago. He reports that he does not drink alcohol and does not use drugs.   Allergies  Allergen Reactions   Azithromycin Other (See Comments) and Nausea And Vomiting    "Severe stomach cramps; told to list as an allergy by dr. Huel Cote ago"   Avelox [Moxifloxacin Hcl In Nacl] Other (See Comments)    Hemolysis  In 2012   Bactrim [Sulfamethoxazole-Trimethoprim] Diarrhea and Nausea And Vomiting    Family History  Problem Relation Age of Onset   Breast cancer Mother    Cancer Mother        Breast   Rheumatic fever Father    Heart disease Father    Heart attack Father        Massive    Diabetes Son       Prior to Admission medications   Medication Sig Start Date End Date Taking? Authorizing Provider  albuterol (PROVENTIL HFA;VENTOLIN HFA) 108 (90 Base) MCG/ACT inhaler Inhale 2 puffs every 6 (six) hours as needed into the lungs for wheezing or shortness of breath. 05/17/17   Hassell Done [name], FNP  aspirin 81 MG tablet Take 81 mg by mouth daily.    [provider]  doxycycline (VIBRA-TABS) 100 MG tablet Take 1 tablet (100 mg total) by mouth 2 (two) times daily. 1 po bid 11/18/20   Hassell Done, [name], FNP  famotidine (PEPCID) 20 MG tablet Take 1 tablet (20 mg total) by mouth every morning. Reported on 04/16/2015 10/25/20   Chevis Pretty, FNP  fluticasone [facility]) 50 MCG/ACT nasal spray SPRAY 2 SPRAYS INTO EACH NOSTRIL EVERY DAY 11/17/18   Hassell Done, [name], FNP  Fluticasone-Umeclidin-Vilant (TRELEGY ELLIPTA) 100-62.5-25 MCG/INH AEPB Inhale 1 puff into the lungs 2 (two) times daily. 10/25/20   Hassell Done [name], FNP  HYDROcodone-acetaminophen (NORCO/VICODIN) 5-325 MG tablet Take 1 tablet by mouth in the morning and at bedtime. 12/25/20 01/24/21  Chevis Pretty, FNP  HYDROcodone-acetaminophen (NORCO/VICODIN) 5-325 MG tablet Take 1 tablet by mouth in the morning and at bedtime. 11/25/20 12/25/20  Hassell Done,  [name], FNP  insulin glargine (LANTUS) 100 UNIT/ML injection INJECT 0.4 MLS (40 UNITS TOTAL) INTO THE SKIN IN THE MORNING. 12/02/20   Hassell Done, [name], FNP  Insulin Syringe-Needle U-100 (B-D INS SYR ULTRAFINE 1CC/30G) 30G X 1/2" 1 ML MISC Use 4 times a day with insulin Dx E11.9 08/22/20   Hassell Done, [name], FNP  Insulin Syringes, Disposable, U-100 1 ML MISC Use 4 times a day for insulin injection Dx E11.9 12/27/18   Hassell Done, [name], FNP  lisinopril (ZESTRIL) 10 MG tablet Take 1 tablet (10 mg total) by mouth daily. 10/25/20   Hassell Done, [name], FNP  loratadine (CLARITIN) 10 MG tablet Take 10 mg by mouth at bedtime.    [provider]  NOVOLOG 100 UNIT/ML injection PER SLIDING SCALE: 190 - 200 = 2 UNITS 300 AND ABOVE = 7 UNITS 12/02/20   Chevis Pretty, FNP    Physical Exam: BP (!) 120/92   Pulse (!) 157  Temp 98.3 F (36.8 C) (Oral)   Resp (!) 21   SpO2 97%   General: 65 y.o. year-old male well developed well nourished in no acute distress.  Alert and oriented x3. Cardiovascular: Regular rate and rhythm with no rubs or gallops.  No thyromegaly or JVD noted.  No lower extremity edema. 2/4 pulses in all 4 extremities. Respiratory: Clear to auscultation with no wheezes or rales. Good inspiratory effort. Abdomen: Soft epigastric tenderness to palpation.  Bowel sounds present.  Muskuloskeletal: No cyanosis, clubbing or edema noted bilaterally Neuro: CN II-XII intact, strength, sensation, reflexes Skin: No ulcerative lesions noted or rashes Psychiatry: Judgement and insight appear normal. Mood is appropriate for condition and setting          Labs on Admission:  Basic Metabolic Panel: Recent Labs  Lab 12/02/20 1408  NA 131*  K 4.3  CL 92*  CO2 22  GLUCOSE 193*  BUN 19  CREATININE 1.22  CALCIUM 9.7   Liver Function Tests: Recent Labs  Lab 12/02/20 1408  AST 44*  ALT 29  ALKPHOS 61  BILITOT 4.3*  PROT 7.3  ALBUMIN 3.8   Recent Labs   Lab 12/02/20 1408  LIPASE 317*   No results for input(s): AMMONIA in the last 168 hours. CBC: Recent Labs  Lab 12/02/20 1408  WBC 8.2  NEUTROABS 7.5  HGB 16.5  HCT 46.4  MCV 102.9*  PLT 186   Cardiac Enzymes: No results for input(s): CKTOTAL, CKMB, CKMBINDEX, TROPONINI in the last 168 hours.  BNP (last 3 results) No results for input(s): BNP in the last 8760 hours.  ProBNP (last 3 results) No results for input(s): PROBNP in the last 8760 hours.  CBG: No results for input(s): GLUCAP in the last 168 hours.  Radiological Exams on Admission: CT ABDOMEN PELVIS W CONTRAST  Result Date: 12/02/2020 CLINICAL DATA:  Abdominal pain, acute, nonlocalized. EXAM: CT ABDOMEN AND PELVIS WITH CONTRAST TECHNIQUE: Multidetector CT imaging of the abdomen and pelvis was performed using the standard protocol following bolus administration of intravenous contrast. CONTRAST:  181mL OMNIPAQUE IOHEXOL 300 MG/ML  SOLN COMPARISON:  11/25/2014 FINDINGS: Lower chest: Lung bases are clear. Hepatobiliary: Slightly decreased attenuation of the liver. No discrete liver lesion. Main portal venous system is patent. Normal appearance of the gallbladder without biliary dilatation. Pancreas: Mild stranding around the pancreatic head and descending duodenum. No significant pancreatic duct dilatation. Spleen: Normal in size without focal abnormality. Adrenals/Urinary Tract: Normal adrenal glands. Small exophytic low-density structure in the right kidney likely represents a small cyst. No hydronephrosis. Normal appearance of the urinary bladder. Small left renal calcifications that are probably not within the collecting system. Stomach/Bowel: Edema and/or trace fluid around the proximal duodenum and descending duodenum. No evidence for a bowel obstruction. Vascular/Lymphatic: Chronic occlusion in the proximal right SFA. Diffuse atherosclerotic disease in the abdomen and pelvis. Negative for an aortic aneurysm. No lymph node  enlargement in the abdomen or pelvis. Reproductive: Prostate is unremarkable. Other: No pelvic ascites. Umbilical hernia containing fat. Negative for free air. Musculoskeletal: Multilevel disc space narrowing in the lumbar spine. No acute bone abnormality. IMPRESSION: 1. Inflammation and stranding centered around the pancreatic head and proximal duodenum. Differential diagnosis includes pancreatitis and duodenitis. No evidence for free air or focal fluid collections. 2. Diffuse atherosclerotic disease in the abdomen and pelvis. 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 3. Small left renal calcifications without hydronephrosis. These calcifications could be vascular in etiology and post inflammatory. Electronically Signed  By: Markus Daft M.D.   On: 12/02/2020 17:54   DG Abd Acute W/Chest  Result Date: 12/02/2020 CLINICAL DATA:  Abdomen pain EXAM: DG ABDOMEN ACUTE WITH 1 VIEW CHEST COMPARISON:  11/25/2014 FINDINGS: Single-view chest demonstrates no focal opacity or pleural effusion. Mild diffuse reticular opacity likely due to chronic lung disease. Scarring at the apices. Normal cardiac size. Supine and upright views of the abdomen demonstrate no free air beneath the diaphragm. Nonobstructed bowel-gas pattern. Probable small left kidney stones. Scoliosis and degenerative changes of the spine IMPRESSION: 1. Coarse interstitial opacity consistent with chronic disease. No acute airspace disease 2. Nonobstructed gas pattern 3. Probable left kidney stones Electronically Signed   By: Donavan Foil M.D.   On: 12/02/2020 15:23    EKG: I independently viewed the EKG done and my findings are as followed: A flutter, rate of 152, nonspecific ST-T changes.  QTc 486.  Assessment/Plan Present on Admission:  Acute pancreatitis  Active Problems:   Acute pancreatitis Acute alcoholic pancreatitis Evidence of acute pancreatitis seen on CT scan, elevated lipase level 317.  Daily alcohol  use. N.p.o. for now, IV fluid, IV analgesics IV antiemetics as needed IV fluids LR at 150 cc/h x 2 days.  Monitor volume status. Repeat lipase level in the morning.  A flutter with RVR Presented with a rate in the 150s Started on Cardizem drip in the ED Not on any rate control agent prior to admission Not on oral anticoagulation Start p.o. Lopressor 12.5 mg twice daily. Add IV Lopressor as needed with parameters.  Elevated liver chemistries in the setting of alcoholism. No gallstones or common bile duct stone reported on CT scan. Presented with AST 44, T bilirubin 4.3. Repeat CMP in the morning Avoid hepatotoxic agents, use acetaminophen judiciously if needed  History of perforated ulcer in 2016 requiring surgery Resume home PPI Avoid NSAIDs  Alcohol abuse Drinks 5 quarts of liquid per week. Alcohol cessation counseling CIWA protocol TOC consulted to provide resources for alcohol cessation  Hypovolemic hyponatremia Serum sodium 131 Continue IV fluid hydration Repeat chemistry panel in the morning.  Type 2 diabetes with hyperglycemia Hold off home oral hypoglycemics  Obtain hemoglobin A1c Start insulin sliding scale.  GERD Resume home PPI.  Ambulatory dysfunction with falls In the setting of chronic use of alcohol.  PT OT assessment Fall precautions   DVT prophylaxis: Subcu Lovenox daily.  Code Status: Full code  Family Communication: His wife at bedside.  All questions answered to the best of my ability.  Disposition Plan: Admitted to telemetry medical  Consults called: None   Admission status:  Inpatient status.  Patient will require at least 2 midnights for further evaluation and treatment of present condition.   Status is: Inpatient    Dispo:  Patient From:  Home  Planned Disposition:  Home  Medically stable for discharge:  No          Kayleen Memos MD [facility] Pager [phone]  If 7PM-7AM, please contact  night-coverage [email or web address] Password [facility]  12/02/2020, 7:29 PM

## 2020-12-02 NOTE — ED Notes (Signed)
RN went into room and found pt trying to get out of bed, visitor was trying to stop him.  RN managed to convince pt to get back in bed.  RN told him we will order a hospital bed for him.

## 2020-12-02 NOTE — ED Provider Notes (Signed)
[facility] EMERGENCY DEPARTMENT Provider Note   CSN: [id number] Arrival date & time: 12/02/20  1325     History Chief Complaint  Patient presents with   Abdominal Pain    Thomas Sandoval. is a 65 y.o. male.  HPI Patient is a 65 year old male with past medical history significant for chronic back pain, COPD, DM 2, HTN, history of a flutter that converted with Cardizem, history of perforated gastric ulcer, mild MVP, normal echo 2019  Patient is presented to the ER today with complaints of nausea vomiting abdominal pain ongoing for 2 days.  He states that his abdominal pain became severe and was so painful that he became so short of breath.  He describes the pain as 12/10 achy constant occasionally sharp and stabbing in upper abdomen.  Denies any dark or tarry stools denies any hematemesis but has had several episodes of nonbloody nonbilious emesis.  He drinks 5 quarts of vodka per week.  States that his last drink was Saturday.  Denies any chest pain lightheadedness or dizziness.  Denies any heart palpitations denies any leg swelling or calf pain.  No history of blood clots in legs or lungs.  No trauma to his abdomen.     Past Medical History:  Diagnosis Date   Arthritis    Bladder cancer ([place])    Chronic back pain    COPD (chronic obstructive pulmonary disease) (HCC)    DDD (degenerative disc disease)    Diabetic retinopathy of both eyes (HCC)    Essential hypertension    GERD (gastroesophageal reflux disease)    History of atrial flutter 02/2011   Converted to NSR with Cardizem   History of chronic bronchitis    History of hemolytic anemia 02/2011   secondary to Avelox   HOH (hard of hearing)    HOH (hard of hearing)    no eardrum and nerve damage on R, also HOH on L   Mitral valve prolapse    a. 2D Echo 11/27/14: EF 55-60%; images were inadequate for LV wall motion assessment, + mild late systolic mitral valve prolapse involving the anterior  leaflet.   PAD (peripheral artery disease) ([place]) 04/2014   Dr Trula Slade; bilateral SFA occlusion, R mid, L distal   PAF (paroxysmal atrial fibrillation) ([place])    a. Dx 11/2014 during admission for perf ulcer.   Perforated ulcer ([place])    a. 11/2014 s/p surgery.   Productive cough    Smokers' cough ([place])    Type 1 diabetes mellitus ([place]) 1977    Patient Active Problem List   Diagnosis Date Noted   Acute pharyngitis 06/10/2020   Hearing loss 07/26/2019   Back pain 05/02/2019   Mild renal insufficiency 08/25/2017   Hyperkalemia 08/25/2017   Prepyloric ulcer 12/12/2014   Essential hypertension 12/07/2014   Mitral valve prolapse 12/06/2014   Atrial fibrillation with rapid ventricular response ([place]) 11/26/2014   GERD (gastroesophageal reflux disease) 02/06/2013   DM type 2 (diabetes mellitus, type 2) ([place]) 02/04/2013   COPD (chronic obstructive pulmonary disease) ([place]) 02/20/2011    Past Surgical History:  Procedure Laterality Date   CYSTOSCOPY WITH URETEROSCOPY Right 08/14/2013   Procedure: CYSTOSCOPY WITH URETEROSCOPY BLADDER BIOPSY ;  Surgeon: Claybon Jabs, MD;  Location[facility];  Service: Urology;  Laterality: Right;   ESOPHAGOGASTRODUODENOSCOPY N/A 02/06/2013   Procedure: ESOPHAGOGASTRODUODENOSCOPY (EGD);  Surgeon: Lafayette Dragon, MD;  Location: [facility] ENDOSCOPY;  Service: Endoscopy;  Laterality: N/A;   LAPAROSCOPY N/A  11/25/2014   Procedure: LAPAROSCOPIC PRIMARY REPAIR OF PERFORATED PREPYLORIC ULCER WITH Silvestre Gunner;  Surgeon: Greer Pickerel, MD;  Location: [facility];  Service: General;  Laterality: N/A;   TONSILLECTOMY  as child   TRANSTHORACIC ECHOCARDIOGRAM  02-17-2011   MODERATE LVH/  EF 65%   TRANSURETHRAL RESECTION OF BLADDER TUMOR WITH GYRUS (TURBT-GYRUS) N/A 06/12/2013   Procedure: TRANSURETHRAL RESECTION OF BLADDER TUMOR WITH GYRUS (TURBT-GYRUS);  Surgeon: Claybon Jabs, MD;  Location: [facility];  Service: Urology;  Laterality: N/A;   TYMPANIC  MEMBRANE REPAIR  as child       Family History  Problem Relation Age of Onset   Breast cancer Mother    Cancer Mother        Breast   Rheumatic fever Father    Heart disease Father    Heart attack Father        Massive    Diabetes Son     Social History   Tobacco Use   Smoking status: Every Day    Packs/day: 2.00    Years: 30.00    Pack years: 60.00    Types: Cigarettes   Smokeless tobacco: Former    Quit date: 06/08/1978  Vaping Use   Vaping Use: Never used  Substance Use Topics   Alcohol use: No    Alcohol/week: 0.0 standard drinks   Drug use: No    Home Medications Prior to Admission medications   Medication Sig Start Date End Date Taking? Authorizing Provider  albuterol (PROVENTIL HFA;VENTOLIN HFA) 108 (90 Base) MCG/ACT inhaler Inhale 2 puffs every 6 (six) hours as needed into the lungs for wheezing or shortness of breath. 05/17/17   Hassell Done [name], FNP  aspirin 81 MG tablet Take 81 mg by mouth daily.    [provider]  doxycycline (VIBRA-TABS) 100 MG tablet Take 1 tablet (100 mg total) by mouth 2 (two) times daily. 1 po bid 11/18/20   Hassell Done, [name], FNP  famotidine (PEPCID) 20 MG tablet Take 1 tablet (20 mg total) by mouth every morning. Reported on 04/16/2015 10/25/20   Chevis Pretty, FNP  fluticasone [facility] 50 MCG/ACT nasal spray SPRAY 2 SPRAYS INTO EACH NOSTRIL EVERY DAY 11/17/18   Hassell Done, [name], FNP  Fluticasone-Umeclidin-Vilant (TRELEGY ELLIPTA) 100-62.5-25 MCG/INH AEPB Inhale 1 puff into the lungs 2 (two) times daily. 10/25/20   Hassell Done [name], FNP  HYDROcodone-acetaminophen (NORCO/VICODIN) 5-325 MG tablet Take 1 tablet by mouth in the morning and at bedtime. 12/25/20 01/24/21  Chevis Pretty, FNP  HYDROcodone-acetaminophen (NORCO/VICODIN) 5-325 MG tablet Take 1 tablet by mouth in the morning and at bedtime. 11/25/20 12/25/20  Hassell Done, [name], FNP  insulin glargine (LANTUS) 100 UNIT/ML injection INJECT 0.4  MLS (40 UNITS TOTAL) INTO THE SKIN IN THE MORNING. 12/02/20   Hassell Done, [name], FNP  Insulin Syringe-Needle U-100 (B-D INS SYR ULTRAFINE 1CC/30G) 30G X 1/2" 1 ML MISC Use 4 times a day with insulin Dx E11.9 08/22/20   Hassell Done, [name], FNP  Insulin Syringes, Disposable, U-100 1 ML MISC Use 4 times a day for insulin injection Dx E11.9 12/27/18   Hassell Done, [name], FNP  lisinopril (ZESTRIL) 10 MG tablet Take 1 tablet (10 mg total) by mouth daily. 10/25/20   Hassell Done, [name], FNP  loratadine (CLARITIN) 10 MG tablet Take 10 mg by mouth at bedtime.    [provider]  NOVOLOG 100 UNIT/ML injection PER SLIDING SCALE: 190 - 200 = 2 UNITS 300 AND ABOVE = 7 UNITS 12/02/20   Chevis Pretty, FNP  Allergies    Azithromycin, Avelox [moxifloxacin hcl in nacl], and Bactrim [sulfamethoxazole-trimethoprim]  Review of Systems   Review of Systems  Constitutional:  Negative for chills and fever.  HENT:  Negative for congestion.   Eyes:  Negative for pain.  Respiratory:  Negative for cough and shortness of breath.   Cardiovascular:  Negative for chest pain and leg swelling.  Gastrointestinal:  Positive for abdominal pain, nausea and vomiting. Negative for diarrhea.  Genitourinary:  Negative for dysuria.  Musculoskeletal:  Negative for myalgias.  Skin:  Negative for rash.  Neurological:  Negative for dizziness and headaches.   Physical Exam Updated Vital Signs BP 131/81   Pulse (!) 155   Temp 98.3 F (36.8 C) (Oral)   Resp (!) 29   SpO2 97%   Physical Exam Vitals and nursing note reviewed.  Constitutional:      General: He is in acute distress.     Comments: Uncomfortable appearing 65 year old male  HENT:     Head: Normocephalic and atraumatic.     Nose: Nose normal.  Eyes:     General: No scleral icterus. Cardiovascular:     Rate and Rhythm: Regular rhythm. Tachycardia present.     Pulses: Normal pulses.     Heart sounds: Normal heart sounds.  Pulmonary:      Effort: Pulmonary effort is normal. No respiratory distress.     Breath sounds: Normal breath sounds. No wheezing.  Abdominal:     Palpations: Abdomen is soft.     Tenderness: There is abdominal tenderness.     Comments: Abdomen is mildly protuberant there is tenderness palpation diffusely.  Patient voluntarily guarding initially but with redirection able to relax.  Seems to be tender primarily in the epigastrium and umbilical and then all the way across the lower abdomen.  Musculoskeletal:     Cervical back: Normal range of motion.     Right lower leg: No edema.     Left lower leg: No edema.  Skin:    General: Skin is warm and dry.     Capillary Refill: Capillary refill takes less than 2 seconds.  Neurological:     Mental Status: He is alert. Mental status is at baseline.  Psychiatric:        Mood and Affect: Mood normal.        Behavior: Behavior normal.    ED Results / Procedures / Treatments   Labs (all labs ordered are listed, but only abnormal results are displayed) Labs Reviewed  CBC WITH DIFFERENTIAL/PLATELET - Abnormal; Notable for the following components:      Result Value   MCV 102.9 (*)    MCH 36.6 (*)    Lymphs Abs 0.2 (*)    All other components within normal limits  COMPREHENSIVE METABOLIC PANEL - Abnormal; Notable for the following components:   Sodium 131 (*)    Chloride 92 (*)    Glucose, Bld 193 (*)    AST 44 (*)    Total Bilirubin 4.3 (*)    Anion gap 17 (*)    All other components within normal limits  LIPASE, BLOOD - Abnormal; Notable for the following components:   Lipase 317 (*)    All other components within normal limits  URINALYSIS, ROUTINE W REFLEX MICROSCOPIC - Abnormal; Notable for the following components:   Color, Urine AMBER (*)    APPearance HAZY (*)    Glucose, UA >=500 (*)    Hgb urine dipstick SMALL (*)    Ketones,  ur 80 (*)    Protein, ur 100 (*)    All other components within normal limits  RESP PANEL BY RT-PCR (FLU A&B, COVID)  ARPGX2  LACTIC ACID, PLASMA  TROPONIN I (HIGH SENSITIVITY)    EKG EKG Interpretation  Date/Time:  Monday December 02 2020 14:21:46 EDT Ventricular Rate:  152 PR Interval:  122 QRS Duration: 72 QT Interval:  306 QTC Calculation: 486 R Axis:   -74 Text Interpretation: Undetermined rhythm Left axis deviation Possible Anterior infarct , age undetermined Marked ST abnormality, possible inferior subendocardial injury Abnormal ECG Confirmed by Madalyn Rob [phone]) on 12/02/2020 3:50:54 PM  Radiology CT ABDOMEN PELVIS W CONTRAST  Result Date: 12/02/2020 CLINICAL DATA:  Abdominal pain, acute, nonlocalized. EXAM: CT ABDOMEN AND PELVIS WITH CONTRAST TECHNIQUE: Multidetector CT imaging of the abdomen and pelvis was performed using the standard protocol following bolus administration of intravenous contrast. CONTRAST:  164mL OMNIPAQUE IOHEXOL 300 MG/ML  SOLN COMPARISON:  11/25/2014 FINDINGS: Lower chest: Lung bases are clear. Hepatobiliary: Slightly decreased attenuation of the liver. No discrete liver lesion. Main portal venous system is patent. Normal appearance of the gallbladder without biliary dilatation. Pancreas: Mild stranding around the pancreatic head and descending duodenum. No significant pancreatic duct dilatation. Spleen: Normal in size without focal abnormality. Adrenals/Urinary Tract: Normal adrenal glands. Small exophytic low-density structure in the right kidney likely represents a small cyst. No hydronephrosis. Normal appearance of the urinary bladder. Small left renal calcifications that are probably not within the collecting system. Stomach/Bowel: Edema and/or trace fluid around the proximal duodenum and descending duodenum. No evidence for a bowel obstruction. Vascular/Lymphatic: Chronic occlusion in the proximal right SFA. Diffuse atherosclerotic disease in the abdomen and pelvis. Negative for an aortic aneurysm. No lymph node enlargement in the abdomen or pelvis. Reproductive:  Prostate is unremarkable. Other: No pelvic ascites. Umbilical hernia containing fat. Negative for free air. Musculoskeletal: Multilevel disc space narrowing in the lumbar spine. No acute bone abnormality. IMPRESSION: 1. Inflammation and stranding centered around the pancreatic head and proximal duodenum. Differential diagnosis includes pancreatitis and duodenitis. No evidence for free air or focal fluid collections. 2. Diffuse atherosclerotic disease in the abdomen and pelvis. 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 3. Small left renal calcifications without hydronephrosis. These calcifications could be vascular in etiology and post inflammatory. Electronically Signed   By: Markus Daft M.D.   On: 12/02/2020 17:54   DG Abd Acute W/Chest  Result Date: 12/02/2020 CLINICAL DATA:  Abdomen pain EXAM: DG ABDOMEN ACUTE WITH 1 VIEW CHEST COMPARISON:  11/25/2014 FINDINGS: Single-view chest demonstrates no focal opacity or pleural effusion. Mild diffuse reticular opacity likely due to chronic lung disease. Scarring at the apices. Normal cardiac size. Supine and upright views of the abdomen demonstrate no free air beneath the diaphragm. Nonobstructed bowel-gas pattern. Probable small left kidney stones. Scoliosis and degenerative changes of the spine IMPRESSION: 1. Coarse interstitial opacity consistent with chronic disease. No acute airspace disease 2. Nonobstructed gas pattern 3. Probable left kidney stones Electronically Signed   By: Donavan Foil M.D.   On: 12/02/2020 15:23    Procedures .Critical Care Performed by: Tedd Sias, PA Authorized by: Tedd Sias, PA   Critical care provider statement:    Critical care time (minutes):  35   Critical care time was exclusive of:  Separately billable procedures and treating other patients and teaching time   Critical care was necessary to treat or prevent imminent or life-threatening deterioration of the following conditions:  A  flutter with rapid ventricular response.   Critical care was time spent personally by me on the following activities:  Discussions with consultants, evaluation of patient's response to treatment, examination of patient, review of old charts, re-evaluation of patient's condition, pulse oximetry, ordering and review of radiographic studies, ordering and review of laboratory studies and ordering and performing treatments and interventions   I assumed direction of critical care for this patient from another provider in my specialty: no     Medications Ordered in ED Medications  fentaNYL (SUBLIMAZE) injection 75 mcg (75 mcg Intravenous Given 12/02/20 1538)  diltiazem (CARDIZEM) 125 mg in dextrose 5% 125 mL (1 mg/mL) infusion (5 mg/hr Intravenous New Bag/Given 12/02/20 1826)  HYDROmorphone (DILAUDID) injection 0.5 mg (has no administration in time range)  ondansetron (ZOFRAN) injection 4 mg (4 mg Intravenous Given 12/02/20 1537)  sodium chloride 0.9 % bolus 1,000 mL (0 mLs Intravenous Stopped 12/02/20 1654)  LORazepam (ATIVAN) injection 1 mg (1 mg Intravenous Given 12/02/20 1630)  iohexol (OMNIPAQUE) 300 MG/ML solution 100 mL (100 mLs Intravenous Contrast Given 12/02/20 1655)    ED Course  I have reviewed the triage vital signs and the nursing notes.  Pertinent labs & imaging results that were available during my care of the patient were reviewed by me and considered in my medical decision making (see chart for details).  Clinical Course as of 12/04/20 1210  Mon Dec 02, 2020  1452 DG Abd Acute W/Chest [CC]  1522 Nausea and vomiting for the past 3 days Abdominal pain that started today. Alcoholic drinks 5 quarts of vodka per week.  His last drink was Saturday.  Nonbloody nonbilious emesis. [WF]  4481 Reviewed EMR it appears that September 2016 patient had perforated gastric ulcer that required surgery [WF]  1818 Echo in 2019 with normal EF [WF]    Clinical Course User Index [CC] Couture, Cortni S,  PA-C [WF] [place], [place]   65 year old heavy drinker w pancreatitis here in ER confirmed with CT abd pel w contrast. EKG shows rapid afib/flutter. No CP or SOB.   Very hard of hearing.   Appears dry on exam will IV hydrate, control pain and start on diltiazem drip for rapid atrial tachycardia - p wave morphology roughly similar in appearance making MAT less likely. WAP considered also less like as relatively regular rate. Maybe aflutter highest probabability.   I discussed this case with my attending physician who cosigned this note including patient's presenting symptoms, physical exam, and planned diagnostics and interventions. Attending physician stated agreement with plan or made changes to plan which were implemented.   Attending physician assessed patient at bedside.   Pt understands need for admission  Hypo NA likely d/t dehydration/alcohol use. Elevated BS no anion gap. No leukocytosis/anemia. Lipase elevated CT scan personally reviewed by me. Agree with rad review.  Will admit ot medicne  Given one dose of ativan - last drink was >48 hours ago but some concern for pt withdrawing perhaps tachycardia multifactorial.   MDM Rules/Calculators/A&P                           Discussed with Dr. Nevada Crane of [facility] hospitalist who will admit patient for pancreatitis and atrial flutter  Final Clinical Impression(s) / ED Diagnoses Final diagnoses:  Alcohol-induced acute pancreatitis, unspecified complication status  Generalized abdominal pain  Alcoholism ([place])  Atrial flutter, unspecified type ([place])    Rx / DC Orders ED  Discharge Orders     None        Pati Gallo [place], [place] 12/04/20 1216    Dorie Rank, MD 12/05/20 [phone]

## 2020-12-03 DIAGNOSIS — R1084 Generalized abdominal pain: Secondary | ICD-10-CM

## 2020-12-03 DIAGNOSIS — F1020 Alcohol dependence, uncomplicated: Secondary | ICD-10-CM

## 2020-12-03 LAB — GLUCOSE, CAPILLARY
Glucose-Capillary: 287 mg/dL — ABNORMAL HIGH (ref 70–99)
Glucose-Capillary: 216 mg/dL — ABNORMAL HIGH (ref 70–99)

## 2020-12-03 LAB — CBC
HCT: 45.9 % (ref 39.0–52.0)
Hemoglobin: 15.5 g/dL (ref 13.0–17.0)
MCH: 36.6 pg — ABNORMAL HIGH (ref 26.0–34.0)
MCHC: 33.8 g/dL (ref 30.0–36.0)
MCV: 108.5 fL — ABNORMAL HIGH (ref 80.0–100.0)
Platelets: 135 10*3/uL — ABNORMAL LOW (ref 150–400)
RBC: 4.23 MIL/uL (ref 4.22–5.81)
RDW: 12.6 % (ref 11.5–15.5)
WBC: 10.2 10*3/uL (ref 4.0–10.5)
nRBC: 0 % (ref 0.0–0.2)

## 2020-12-03 LAB — CBG MONITORING, ED
Glucose-Capillary: 193 mg/dL — ABNORMAL HIGH (ref 70–99)
Glucose-Capillary: 206 mg/dL — ABNORMAL HIGH (ref 70–99)
Glucose-Capillary: 228 mg/dL — ABNORMAL HIGH (ref 70–99)

## 2020-12-03 LAB — COMPREHENSIVE METABOLIC PANEL
ALT: 24 U/L (ref 0–44)
AST: 33 U/L (ref 15–41)
Albumin: 3.6 g/dL (ref 3.5–5.0)
Alkaline Phosphatase: 64 U/L (ref 38–126)
Anion gap: 14 (ref 5–15)
BUN: 15 mg/dL (ref 8–23)
CO2: 21 mmol/L — ABNORMAL LOW (ref 22–32)
Calcium: 9.3 mg/dL (ref 8.9–10.3)
Chloride: 95 mmol/L — ABNORMAL LOW (ref 98–111)
Creatinine, Ser: 1.17 mg/dL (ref 0.61–1.24)
GFR, Estimated: >60 mL/min (ref >60)
Glucose, Bld: 187 mg/dL — ABNORMAL HIGH (ref 70–99)
Potassium: 4.2 mmol/L (ref 3.5–5.1)
Sodium: 130 mmol/L — ABNORMAL LOW (ref 135–145)
Total Bilirubin: 2.5 mg/dL — ABNORMAL HIGH (ref 0.3–1.2)
Total Protein: 6.2 g/dL — ABNORMAL LOW (ref 6.5–8.1)

## 2020-12-03 LAB — PHOSPHORUS: Phosphorus: 3.1 mg/dL (ref 2.5–4.6)

## 2020-12-03 LAB — HEMOGLOBIN A1C
Hgb A1c MFr Bld: 5.7 % — ABNORMAL HIGH (ref 4.8–5.6)
Mean Plasma Glucose: 116.89 mg/dL

## 2020-12-03 LAB — MAGNESIUM: Magnesium: 1.3 mg/dL — ABNORMAL LOW (ref 1.7–2.4)

## 2020-12-03 LAB — LIPASE, BLOOD: Lipase: 186 U/L — ABNORMAL HIGH (ref 11–51)

## 2020-12-03 MED ORDER — SODIUM CHLORIDE 0.9 % IV SOLN: INTRAVENOUS | Status: DC

## 2020-12-03 MED ORDER — INSULIN GLARGINE-YFGN 100 UNIT/ML ~~LOC~~ SOLN
20.0000 [IU] | Freq: Every day | SUBCUTANEOUS | Status: DC
  Administered 2020-12-03 – 2020-12-04 (×2): 20 [IU] via SUBCUTANEOUS
  Filled 2020-12-03 (×4): qty 0.2

## 2020-12-03 MED ORDER — HYDROCODONE-ACETAMINOPHEN 5-325 MG PO TABS
1.0000 | ORAL_TABLET | Freq: Two times a day (BID) | ORAL | Status: DC | PRN
Start: 2020-12-03 — End: 2020-12-04
  Administered 2020-12-03: 1 via ORAL
  Filled 2020-12-03: qty 1

## 2020-12-03 MED ORDER — MAGNESIUM SULFATE 4 GM/100ML IV SOLN
4.0000 g | Freq: Once | INTRAVENOUS | Status: AC
  Administered 2020-12-03: 4 g via INTRAVENOUS
  Filled 2020-12-03: qty 100

## 2020-12-03 NOTE — Progress Notes (Signed)
Patient has been educated about safety in the room, and is also asking for a shower. This RN let patient know that because is unsteady on his feet it is not safe for patient to get in the shower. Patient is not willing to put on socks or stay seated. Stated that he was not going to fall ,while stumbling around the room, and if he did he would not tell anyone about it.  He also made mention of bringing medication from home and possibly taking it without our knowledge.  Patient educated on the risks and danger of doing that.   Aurther Loft, RN

## 2020-12-03 NOTE — Progress Notes (Signed)
PROGRESS NOTE    Thomas Sandoval.  QAS:[id number] DOB: 1955-09-01 DOA: 12/02/2020 PCP: Chevis Pretty, FNP   Brief Narrative: Taken from H&P. Thomas Sandoval. is a 65 y.o. male with medical history significant for alcohol abuse, history of perforated ulcer requiring surgery in 2016, paroxysmal A. fib not on oral anticoagulation, type 2 diabetes, mitral valve prolapse, COPD, hyperlipidemia, essential hypertension, GERD, peripheral vascular disease who presented to [facility] ED due to worsening abdominal pain for the past 2 days, which became severe on the day of presentation.  Associated with nausea and vomiting x2 days.   While in the ED, he is found to be in A. fib/a flutter with RVR.  Lipase level elevated 317, evidence of acute pancreatitis on CT scan.  Started on Cardizem drip, received IV fluid hydration 1 L normal saline bolus, and IV analgesics in the ED.   Subjective: Patient was seen and examined today.  Continues to have some epigastric pain, no nausea or vomiting.  He wants to try food.  Having some lower back pain which is chronic.  Assessment & Plan:   Active Problems:   Acute pancreatitis  Acute alcoholic pancreatitis.  Elevated lipase with CT evidence of acute pancreatitis.  Uses alcohol on a daily basis.  No nausea or vomiting and wants to try little advance diet.  Able to tolerate clear liquid. -Advance diet to full liquid. -Continue with pain management -Continue with IV fluid for another day  Alcohol abuse.  Drinks 5 quart of liquor per week. -Alcohol cessation counseling was provided. -Continue with CIWA protocol  A flutter with RVR.  Rate improved with Cardizem infusion which has been discontinued now. Not on any oral anticoagulation at home-history of perforated ulcer. -Continue Lopressor 12.5 mg twice daily.  Abnormal liver enzymes.  Most likely with alcohol use.  No gallstones or common bile duct stones reported on CT abdomen.  Seems improving, T bili  at 2.5 now. -Avoid hepatotoxins  Hyponatremia.  Mild hyponatremia most likely secondary to alcohol use. -Continue to monitor  History of perforated ulcer in 2016 requiring surgery Resume home PPI Avoid NSAIDs  Type 2 diabetes with hyperglycemia.  A1c of 5.7 Hold off home oral hypoglycemics  -SSI  Ambulatory dysfunction with falls In the setting of chronic use of alcohol.  PT OT assessment Fall precautions  Objective: Vitals:   12/03/20 1057 12/03/20 1130 12/03/20 1149 12/03/20 1228  BP: 117/69 124/66  139/65  Pulse: 74 69  77  Resp: (!) 22 17  18   Temp:   98.4 F (36.9 C) 97.7 F (36.5 C)  TempSrc:   Oral   SpO2: 99% 98%  97%  Weight:    77.7 kg  Height:    5\' 11"  (1.803 m)    Intake/Output Summary (Last 24 hours) at 12/03/2020 1429 Last data filed at 12/02/2020 1654 Gross per 24 hour  Intake 999 ml  Output --  Net 999 ml   Filed Weights   12/03/20 1228  Weight: 77.7 kg    Examination:  General exam: Appears calm and comfortable  Respiratory system: Clear to auscultation. Respiratory effort normal. Cardiovascular system: S1 & S2 heard, RRR.  Gastrointestinal system: Soft, mild epigastric tenderness, nondistended, bowel sounds positive. Central nervous system: Alert and oriented. No focal neurological deficits. Extremities: No edema, no cyanosis, pulses intact and symmetrical. Psychiatry: Judgement and insight appear normal.    DVT prophylaxis: Lovenox Code Status: Full Family Communication: Wife was updated at bedside Disposition Plan:  Status  is: Inpatient  Remains inpatient appropriate because:Inpatient level of care appropriate due to severity of illness  Dispo:  Patient From: Home  Planned Disposition: Home  Medically stable for discharge: No   Level of care: Telemetry Medical  All the records are reviewed and case discussed with Care Management/Social Worker. Management plans discussed with the patient, nursing and they are in  agreement.  Consultants:  None  Procedures:  Antimicrobials:   Data Reviewed: I have personally reviewed following labs and imaging studies  CBC: Recent Labs  Lab 12/02/20 1408 12/03/20 0500  WBC 8.2 10.2  NEUTROABS 7.5  --   HGB 16.5 15.5  HCT 46.4 45.9  MCV 102.9* 108.5*  PLT 186 109*   Basic Metabolic Panel: Recent Labs  Lab 12/02/20 1408 12/03/20 0500  NA 131* 130*  K 4.3 4.2  CL 92* 95*  CO2 22 21*  GLUCOSE 193* 187*  BUN 19 15  CREATININE 1.22 1.17  CALCIUM 9.7 9.3  MG  --  1.3*  PHOS  --  3.1   GFR: Estimated Creatinine Clearance: 68.8 mL/min (by C-G formula based on SCr of 1.17 mg/dL). Liver Function Tests: Recent Labs  Lab 12/02/20 1408 12/03/20 0500  AST 44* 33  ALT 29 24  ALKPHOS 61 64  BILITOT 4.3* 2.5*  PROT 7.3 6.2*  ALBUMIN 3.8 3.6   Recent Labs  Lab 12/02/20 1408 12/03/20 0500  LIPASE 317* 186*   No results for input(s): AMMONIA in the last 168 hours. Coagulation Profile: No results for input(s): INR, PROTIME in the last 168 hours. Cardiac Enzymes: No results for input(s): CKTOTAL, CKMB, CKMBINDEX, TROPONINI in the last 168 hours. BNP (last 3 results) No results for input(s): PROBNP in the last 8760 hours. HbA1C: Recent Labs    12/03/20 0500  HGBA1C 5.7*   CBG: Recent Labs  Lab 12/02/20 2018 12/03/20 0032 12/03/20 0643 12/03/20 0835  GLUCAP 343* 228* 193* 206*   Lipid Profile: No results for input(s): CHOL, HDL, LDLCALC, TRIG, CHOLHDL, LDLDIRECT in the last 72 hours. Thyroid Function Tests: No results for input(s): TSH, T4TOTAL, FREET4, T3FREE, THYROIDAB in the last 72 hours. Anemia Panel: No results for input(s): VITAMINB12, FOLATE, FERRITIN, TIBC, IRON, RETICCTPCT in the last 72 hours. Sepsis Labs: Recent Labs  Lab 12/02/20 1540  LATICACIDVEN 1.9    Recent Results (from the past 240 hour(s))  Resp Panel by RT-PCR (Flu A&B, Covid) Nasopharyngeal Swab     Status: None   Collection Time: 12/02/20  6:18 PM    Specimen: Nasopharyngeal Swab; Nasopharyngeal(NP) swabs in vial transport medium  Result Value Ref Range Status   SARS Coronavirus 2 by RT PCR NEGATIVE NEGATIVE Final    Comment: (NOTE) SARS-CoV-2 target nucleic acids are NOT DETECTED.  The SARS-CoV-2 RNA is generally detectable in upper respiratory specimens during the acute phase of infection. The lowest concentration of SARS-CoV-2 viral copies this assay can detect is 138 copies/mL. A negative result does not preclude SARS-Cov-2 infection and should not be used as the sole basis for treatment or other patient management decisions. A negative result may occur with  improper specimen collection/handling, submission of specimen other than nasopharyngeal swab, presence of viral mutation(s) within the areas targeted by this assay, and inadequate number of viral copies(<138 copies/mL). A negative result must be combined with clinical observations, patient history, and epidemiological information. The expected result is Negative.  Fact Sheet for Patients:  EntrepreneurPulse.com.au  Fact Sheet for Healthcare Providers:  IncredibleEmployment.be  This test is no t  yet approved or cleared by the [place] and  has been authorized for detection and/or diagnosis of SARS-CoV-2 by FDA under an Emergency Use Authorization (EUA). This EUA will remain  in effect (meaning this test can be used) for the duration of the COVID-19 declaration under Section 564(b)(1) of the Act, 21 U.S.C.section 360bbb-3(b)(1), unless the authorization is terminated  or revoked sooner.       Influenza A by PCR NEGATIVE NEGATIVE Final   Influenza B by PCR NEGATIVE NEGATIVE Final    Comment: (NOTE) The Xpert Xpress SARS-CoV-2/FLU/RSV plus assay is intended as an aid in the diagnosis of influenza from Nasopharyngeal swab specimens and should not be used as a sole basis for treatment. Nasal washings and aspirates are  unacceptable for Xpert Xpress SARS-CoV-2/FLU/RSV testing.  Fact Sheet for Patients: EntrepreneurPulse.com.au  Fact Sheet for Healthcare Providers: IncredibleEmployment.be  This test is not yet approved or cleared by the [place] FDA and has been authorized for detection and/or diagnosis of SARS-CoV-2 by FDA under an Emergency Use Authorization (EUA). This EUA will remain in effect (meaning this test can be used) for the duration of the COVID-19 declaration under Section 564(b)(1) of the Act, 21 U.S.C. section 360bbb-3(b)(1), unless the authorization is terminated or revoked.  Performed at [facility], [place] [address], [place], [place] [address]      Radiology Studies: CT ABDOMEN PELVIS W CONTRAST  Result Date: 12/02/2020 CLINICAL DATA:  Abdominal pain, acute, nonlocalized. EXAM: CT ABDOMEN AND PELVIS WITH CONTRAST TECHNIQUE: Multidetector CT imaging of the abdomen and pelvis was performed using the standard protocol following bolus administration of intravenous contrast. CONTRAST:  11mL OMNIPAQUE IOHEXOL 300 MG/ML  SOLN COMPARISON:  11/25/2014 FINDINGS: Lower chest: Lung bases are clear. Hepatobiliary: Slightly decreased attenuation of the liver. No discrete liver lesion. Main portal venous system is patent. Normal appearance of the gallbladder without biliary dilatation. Pancreas: Mild stranding around the pancreatic head and descending duodenum. No significant pancreatic duct dilatation. Spleen: Normal in size without focal abnormality. Adrenals/Urinary Tract: Normal adrenal glands. Small exophytic low-density structure in the right kidney likely represents a small cyst. No hydronephrosis. Normal appearance of the urinary bladder. Small left renal calcifications that are probably not within the collecting system. Stomach/Bowel: Edema and/or trace fluid around the proximal duodenum and descending duodenum. No evidence for a bowel obstruction.  Vascular/Lymphatic: Chronic occlusion in the proximal right SFA. Diffuse atherosclerotic disease in the abdomen and pelvis. Negative for an aortic aneurysm. No lymph node enlargement in the abdomen or pelvis. Reproductive: Prostate is unremarkable. Other: No pelvic ascites. Umbilical hernia containing fat. Negative for free air. Musculoskeletal: Multilevel disc space narrowing in the lumbar spine. No acute bone abnormality. IMPRESSION: 1. Inflammation and stranding centered around the pancreatic head and proximal duodenum. Differential diagnosis includes pancreatitis and duodenitis. No evidence for free air or focal fluid collections. 2. Diffuse atherosclerotic disease in the abdomen and pelvis. 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 3. Small left renal calcifications without hydronephrosis. These calcifications could be vascular in etiology and post inflammatory. Electronically Signed   By: Markus Daft M.D.   On: 12/02/2020 17:54   DG Abd Acute W/Chest  Result Date: 12/02/2020 CLINICAL DATA:  Abdomen pain EXAM: DG ABDOMEN ACUTE WITH 1 VIEW CHEST COMPARISON:  11/25/2014 FINDINGS: Single-view chest demonstrates no focal opacity or pleural effusion. Mild diffuse reticular opacity likely due to chronic lung disease. Scarring at the apices. Normal cardiac size. Supine and upright views of the  abdomen demonstrate no free air beneath the diaphragm. Nonobstructed bowel-gas pattern. Probable small left kidney stones. Scoliosis and degenerative changes of the spine IMPRESSION: 1. Coarse interstitial opacity consistent with chronic disease. No acute airspace disease 2. Nonobstructed gas pattern 3. Probable left kidney stones Electronically Signed   By: Donavan Foil M.D.   On: 12/02/2020 15:23    Scheduled Meds:  enoxaparin (LOVENOX) injection  40 mg Subcutaneous V74X   folic acid  1 mg Oral Daily   insulin aspart  0-9 Units Subcutaneous Q4H   metoprolol tartrate  12.5 mg Oral BID    multivitamin with minerals  1 tablet Oral Daily   thiamine  100 mg Oral Daily   Or   thiamine  100 mg Intravenous Daily   Continuous Infusions:  sodium chloride 125 mL/hr at 12/03/20 0840   diltiazem (CARDIZEM) infusion Stopped (12/03/20 1100)     LOS: 1 day   Time spent: 36 minutes. More than 50% of the time was spent in counseling/coordination of care  Lorella Nimrod, MD [facility]  If 7PM-7AM, please contact night-coverage [email or web address]  12/03/2020, 2:29 PM   This record has been created using Systems analyst. Errors have been sought and corrected,but may not always be located. Such creation errors do not reflect on the standard of care.

## 2020-12-03 NOTE — Evaluation (Signed)
Physical Therapy Evaluation Patient Details Name: Thomas Sandoval. MRN: [id number] DOB: 1956/01/04 Today's Date: 12/03/2020  History of Present Illness  Pt is a 65 y/o male admitted 9/26 secondary to SOB and abdominal pain. Found to have acute pancreatitis and was in a fib with RVR. PMH includes alcohol abuse, bladder cancer, a fib, DM, COPD, and HTN.  Clinical Impression  Pt admitted secondary to problem above with deficits below. Pt requiring min to min guard A for transfers and gait within the room. HR ranging from low 80s-100 throughout. Mild unsteadiness noted, but balance improved as gait progressed. Pt reports wife can assist as needed at d/c. Will continue to follow acutely.        Recommendations for follow up therapy are one component of a multi-disciplinary discharge planning process, led by the attending physician.  Recommendations may be updated based on patient status, additional functional criteria and insurance authorization.  Follow Up Recommendations Supervision for mobility/OOB (HHPT vs No PT follow up pending progression)    Equipment Recommendations  None recommended by PT    Recommendations for Other Services       Precautions / Restrictions Precautions Precautions: Fall Restrictions Weight Bearing Restrictions: No      Mobility  Bed Mobility Overal bed mobility: Needs Assistance Bed Mobility: Supine to Sit;Sit to Supine     Supine to sit: Min guard Sit to supine: Min guard   General bed mobility comments: Min guard for safety and line management.    Transfers Overall transfer level: Needs assistance Equipment used: None Transfers: Sit to/from Stand Sit to Stand: Min assist         General transfer comment: Min A for steadying assist to stand.  Ambulation/Gait Ambulation/Gait assistance: Min guard;Min assist Gait Distance (Feet): 20 Feet Assistive device: IV Pole Gait Pattern/deviations: Step-through pattern;Decreased stride  length Gait velocity: Decreased   General Gait Details: Min guard to min A For steadying for short distance gait within the room. RN requesting to monitor HR, so left attached to monitor. HR ranged from low 80s to 100 during ambulation.  Stairs            Wheelchair Mobility    Modified Rankin (Stroke Patients Only)       Balance Overall balance assessment: Needs assistance Sitting-balance support: No upper extremity supported;Feet supported Sitting balance-Leahy Scale: Good     Standing balance support: Single extremity supported;No upper extremity supported;During functional activity Standing balance-Leahy Scale: Fair                               Pertinent Vitals/Pain Pain Assessment: No/denies pain    Home Living Family/patient expects to be discharged to:: Private residence Living Arrangements: Spouse/significant other Available Help at Discharge: Family;Available 24 hours/day Type of Home: House Home Access: Stairs to enter Entrance Stairs-Rails: Left Entrance Stairs-Number of Steps: 4-5 Home Layout: Other (Comment) (step up into bedroom) Home Equipment: Clinical cytogeneticist - 2 wheels;Crutches      Prior Function Level of Independence: Independent               Hand Dominance        Extremity/Trunk Assessment   Upper Extremity Assessment Upper Extremity Assessment: Defer to OT evaluation    Lower Extremity Assessment Lower Extremity Assessment: Generalized weakness    Cervical / Trunk Assessment Cervical / Trunk Assessment: Normal  Communication   Communication: No difficulties  Cognition Arousal/Alertness: Awake/alert Behavior During Therapy:  WFL for tasks assessed/performed Overall Cognitive Status: Impaired/Different from baseline Area of Impairment: Safety/judgement                         Safety/Judgement: Decreased awareness of deficits;Decreased awareness of safety     General Comments: Slightly  impulsive throughout.      General Comments General comments (skin integrity, edema, etc.): Pt's wife present during session    Exercises     Assessment/Plan    PT Assessment Patient needs continued PT services  PT Problem List Decreased strength;Decreased balance;Decreased mobility;Decreased activity tolerance;Decreased safety awareness;Decreased knowledge of precautions;Cardiopulmonary status limiting activity       PT Treatment Interventions DME instruction;Gait training;Functional mobility training;Stair training;Therapeutic activities;Therapeutic exercise;Balance training;Patient/family education    PT Goals (Current goals can be found in the Care Plan section)  Acute Rehab PT Goals Patient Stated Goal: to go home PT Goal Formulation: With patient Time For Goal Achievement: 12/17/20 Potential to Achieve Goals: Good    Frequency Min 3X/week   Barriers to discharge        Co-evaluation               AM-PAC PT "6 Clicks" Mobility  Outcome Measure Help needed turning from your back to your side while in a flat bed without using bedrails?: A Little Help needed moving from lying on your back to sitting on the side of a flat bed without using bedrails?: A Little Help needed moving to and from a bed to a chair (including a wheelchair)?: A Little Help needed standing up from a chair using your arms (e.g., wheelchair or bedside chair)?: A Little Help needed to walk in hospital room?: A Little Help needed climbing 3-5 steps with a railing? : A Little 6 Click Score: 18    End of Session Equipment Utilized During Treatment: Gait belt Activity Tolerance: Patient tolerated treatment well Patient left: in bed;with call bell/phone within reach (on bed in ED) Nurse Communication: Mobility status PT Visit Diagnosis: Unsteadiness on feet (R26.81);Muscle weakness (generalized) (M62.81)    Time: 9373-4287 PT Time Calculation (min) (ACUTE ONLY): 16 min   Charges:   PT  Evaluation $PT Eval Moderate Complexity: 1 Mod          Reuel Derby, PT, DPT  Acute Rehabilitation Services  Pager: [phone] Office: [phone]   Rudean Hitt 12/03/2020, 1:11 PM

## 2020-12-03 NOTE — Progress Notes (Signed)
Inpatient Diabetes Program Recommendations  AACE/ADA: New Consensus Statement on Inpatient Glycemic Control (2015)  Target Ranges:  Prepandial:   less than 140 mg/dL      Peak postprandial:   less than 180 mg/dL (1-2 hours)      Critically ill patients:  140 - 180 mg/dL   Lab Results  Component Value Date   GLUCAP 206 (H) 12/03/2020   HGBA1C 5.7 (H) 12/03/2020    Review of Glycemic Control Results for Thomas Sandoval, Thomas Sandoval (MRN [id number]) as of 12/03/2020 10:29  Ref. Range 12/02/2020 20:18 12/03/2020 00:32 12/03/2020 06:43 12/03/2020 08:35  Glucose-Capillary Latest Ref Range: 70 - 99 mg/dL 343 (H) 228 (H) 193 (H) 206 (H)   Diabetes history: DM 2 Outpatient Diabetes medications: Lantus 40 units Daily, Novolog 0-22 units tid Current orders for Inpatient glycemic control:  Novolog 0-9 units Q4 hours.  A1c 5.7% on 9/27  Inpatient Diabetes Program Recommendations:    Pt takes basal insulin at home.  - Start Semglee 10 units  Thanks,  Tama Headings RN, MSN, BC-ADM Inpatient Diabetes Coordinator Team Pager [phone] (8a-5p)

## 2020-12-03 NOTE — Progress Notes (Signed)
NEW ADMISSION NOTE New Admission Note:   Arrival Method: ED bed Mental Orientation: AAOX4, HOH Telemetry: 82M 10 Assessment: Completed Skin: To be completed IV: RAC, LFA Pain: 0/10 Tubes: n/a Safety Measures: Safety Fall Prevention Plan has been given, discussed and signed Admission: Completed [facility] Orientation: Patient has been orientated to the room, unit and staff.  Family: Wife at bedside  Orders have been reviewed and implemented. Will continue to monitor the patient. Call light has been placed within reach and bed alarm has been activated.   Vira Agar, RN

## 2020-12-04 ENCOUNTER — Inpatient Hospital Stay (HOSPITAL_COMMUNITY): Payer: 59

## 2020-12-04 ENCOUNTER — Encounter (HOSPITAL_COMMUNITY): Payer: Self-pay | Admitting: Internal Medicine

## 2020-12-04 DIAGNOSIS — I483 Typical atrial flutter: Secondary | ICD-10-CM

## 2020-12-04 DIAGNOSIS — E871 Hypo-osmolality and hyponatremia: Secondary | ICD-10-CM

## 2020-12-04 DIAGNOSIS — K852 Alcohol induced acute pancreatitis without necrosis or infection: Secondary | ICD-10-CM | POA: Diagnosis not present

## 2020-12-04 DIAGNOSIS — F1020 Alcohol dependence, uncomplicated: Secondary | ICD-10-CM | POA: Diagnosis not present

## 2020-12-04 DIAGNOSIS — F10239 Alcohol dependence with withdrawal, unspecified: Secondary | ICD-10-CM

## 2020-12-04 LAB — COMPREHENSIVE METABOLIC PANEL
ALT: 21 U/L (ref 0–44)
AST: 33 U/L (ref 15–41)
Albumin: 3.3 g/dL — ABNORMAL LOW (ref 3.5–5.0)
Alkaline Phosphatase: 60 U/L (ref 38–126)
Anion gap: 9 (ref 5–15)
BUN: 8 mg/dL (ref 8–23)
CO2: 27 mmol/L (ref 22–32)
Calcium: 8.8 mg/dL — ABNORMAL LOW (ref 8.9–10.3)
Chloride: 102 mmol/L (ref 98–111)
Creatinine, Ser: 0.92 mg/dL (ref 0.61–1.24)
GFR, Estimated: >60 mL/min (ref >60)
Glucose, Bld: 93 mg/dL (ref 70–99)
Potassium: 3.5 mmol/L (ref 3.5–5.1)
Sodium: 138 mmol/L (ref 135–145)
Total Bilirubin: 1.7 mg/dL — ABNORMAL HIGH (ref 0.3–1.2)
Total Protein: 5.8 g/dL — ABNORMAL LOW (ref 6.5–8.1)

## 2020-12-04 LAB — GLUCOSE, CAPILLARY
Glucose-Capillary: 159 mg/dL — ABNORMAL HIGH (ref 70–99)
Glucose-Capillary: 107 mg/dL — ABNORMAL HIGH (ref 70–99)
Glucose-Capillary: 341 mg/dL — ABNORMAL HIGH (ref 70–99)
Glucose-Capillary: 82 mg/dL (ref 70–99)
Glucose-Capillary: 172 mg/dL — ABNORMAL HIGH (ref 70–99)
Glucose-Capillary: 99 mg/dL (ref 70–99)
Glucose-Capillary: 100 mg/dL — ABNORMAL HIGH (ref 70–99)
Glucose-Capillary: 119 mg/dL — ABNORMAL HIGH (ref 70–99)

## 2020-12-04 LAB — HEPATIC FUNCTION PANEL
ALT: 18 U/L (ref 0–44)
AST: 31 U/L (ref 15–41)
Albumin: 3.2 g/dL — ABNORMAL LOW (ref 3.5–5.0)
Alkaline Phosphatase: 59 U/L (ref 38–126)
Bilirubin, Direct: 0.3 mg/dL — ABNORMAL HIGH (ref 0.0–0.2)
Indirect Bilirubin: 1.3 mg/dL — ABNORMAL HIGH (ref 0.3–0.9)
Total Bilirubin: 1.6 mg/dL — ABNORMAL HIGH (ref 0.3–1.2)
Total Protein: 6 g/dL — ABNORMAL LOW (ref 6.5–8.1)

## 2020-12-04 LAB — MRSA NEXT GEN BY PCR, NASAL: MRSA by PCR Next Gen: NOT DETECTED

## 2020-12-04 MED ORDER — INSULIN ASPART 100 UNIT/ML IJ SOLN
0.0000 [IU] | Freq: Three times a day (TID) | INTRAMUSCULAR | Status: DC
  Administered 2020-12-04: 2 [IU] via SUBCUTANEOUS
  Administered 2020-12-05: 7 [IU] via SUBCUTANEOUS
  Administered 2020-12-05: 2 [IU] via SUBCUTANEOUS

## 2020-12-04 MED ORDER — PANTOPRAZOLE SODIUM 40 MG IV SOLR
40.0000 mg | Freq: Two times a day (BID) | INTRAVENOUS | Status: DC
  Administered 2020-12-04 – 2020-12-05 (×3): 40 mg via INTRAVENOUS
  Filled 2020-12-04 (×3): qty 40

## 2020-12-04 MED ORDER — SODIUM CHLORIDE 0.9 % IV SOLN
INTRAVENOUS | Status: DC | PRN
  Administered 2020-12-04 – 2020-12-06 (×2): 250 mL via INTRAVENOUS

## 2020-12-04 MED ORDER — FUROSEMIDE 10 MG/ML IJ SOLN
40.0000 mg | Freq: Once | INTRAMUSCULAR | Status: AC
  Administered 2020-12-04: 40 mg via INTRAVENOUS

## 2020-12-04 MED ORDER — ORAL CARE MOUTH RINSE
15.0000 mL | Freq: Two times a day (BID) | OROMUCOSAL | Status: DC
  Administered 2020-12-04 – 2020-12-06 (×4): 15 mL via OROMUCOSAL

## 2020-12-04 MED ORDER — INSULIN ASPART 100 UNIT/ML IJ SOLN
0.0000 [IU] | Freq: Every day | INTRAMUSCULAR | Status: DC
  Administered 2020-12-05: 4 [IU] via SUBCUTANEOUS

## 2020-12-04 MED ORDER — HYDROMORPHONE HCL 1 MG/ML IJ SOLN: 1.0000 mg | Freq: Once | INTRAMUSCULAR | Status: DC

## 2020-12-04 MED ORDER — SODIUM CHLORIDE 0.9 % IV SOLN
1.0000 mg | Freq: Every day | INTRAVENOUS | Status: DC
  Administered 2020-12-04: 1 mg via INTRAVENOUS
  Filled 2020-12-04 (×2): qty 0.2

## 2020-12-04 MED ORDER — DEXMEDETOMIDINE HCL IN NACL 400 MCG/100ML IV SOLN
0.2000 ug/kg/h | INTRAVENOUS | Status: DC
  Administered 2020-12-04: 0.4 ug/kg/h via INTRAVENOUS
  Administered 2020-12-04: 0.3 ug/kg/h via INTRAVENOUS
  Filled 2020-12-04 (×4): qty 100

## 2020-12-04 MED ORDER — FUROSEMIDE 10 MG/ML IJ SOLN
INTRAMUSCULAR | Status: AC
  Filled 2020-12-04: qty 4

## 2020-12-04 MED ORDER — MAGNESIUM SULFATE 2 GM/50ML IV SOLN
2.0000 g | Freq: Once | INTRAVENOUS | Status: AC
  Administered 2020-12-04: 2 g via INTRAVENOUS
  Filled 2020-12-04: qty 50

## 2020-12-04 MED ORDER — LACTATED RINGERS IV BOLUS
1000.0000 mL | Freq: Once | INTRAVENOUS | Status: AC
  Administered 2020-12-04: 1000 mL via INTRAVENOUS

## 2020-12-04 MED ORDER — CHLORHEXIDINE GLUCONATE CLOTH 2 % EX PADS
6.0000 | MEDICATED_PAD | Freq: Every day | CUTANEOUS | Status: DC
  Administered 2020-12-04 – 2020-12-05 (×2): 6 via TOPICAL

## 2020-12-04 MED ORDER — LEVALBUTEROL HCL 0.63 MG/3ML IN NEBU: 0.6300 mg | INHALATION_SOLUTION | RESPIRATORY_TRACT | Status: DC | PRN

## 2020-12-04 NOTE — TOC Initial Note (Addendum)
Transition of Care (TOC) - Initial/Assessment Note    Patient Details  Name: Thomas Sandoval. MRN: [id number] Date of Birth: 03/12/1955  [facility]) CM/SW Contact:    Tom-Johnson, Renea Ee, RN Phone Number: 12/04/2020, 4:30 PM  Clinical Narrative:                 CM went to speak with patient at bedside for needs for post hospital transition. Patient is sedated at this time. Unable to assess at this time. CM will continue to follow with TOC needs.    Barriers to Discharge: Continued Medical Work up   Patient Goals and CMS Choice        Expected Discharge Plan and Services                                                Prior Living Arrangements/Services                       Activities of Daily Living Home Assistive Devices/Equipment: Shower chair with back ADL Screening (condition at time of admission) Patient's cognitive ability adequate to safely complete daily activities?: Yes Is the patient deaf or have difficulty hearing?: Yes Does the patient have difficulty seeing, even when wearing glasses/contacts?: No Does the patient have difficulty concentrating, remembering, or making decisions?: No Patient able to express need for assistance with ADLs?: Yes Does the patient have difficulty dressing or bathing?: No Independently performs ADLs?: Yes (appropriate for developmental age) Does the patient have difficulty walking or climbing stairs?: Yes Weakness of Legs: Both Weakness of Arms/Hands: None  Permission Sought/Granted                  Emotional Assessment   Attitude/Demeanor/Rapport: Sedated       Psych Involvement: No (comment)  Admission diagnosis:  Acute pancreatitis [K85.90] Alcoholism ([place]) [F10.20] Generalized abdominal pain [R10.84] Atrial flutter, unspecified type ([place]) [I48.92] Alcohol-induced acute pancreatitis, unspecified complication status [N98.92] Patient Active Problem List   Diagnosis  Date Noted   Generalized abdominal pain    Alcoholism ([place])    Acute pancreatitis 12/02/2020   Acute pharyngitis 06/10/2020   Hearing loss 07/26/2019   Back pain 05/02/2019   Mild renal insufficiency 08/25/2017   Hyperkalemia 08/25/2017   Prepyloric ulcer 12/12/2014   Essential hypertension 12/07/2014   Mitral valve prolapse 12/06/2014   Atrial fibrillation with rapid ventricular response ([place]) 11/26/2014   GERD (gastroesophageal reflux disease) 02/06/2013   DM type 2 (diabetes mellitus, type 2) ([place]) 02/04/2013   COPD (chronic obstructive pulmonary disease) ([place]) 02/20/2011   Atrial flutter/fib 02/16/2011   PCP:  Chevis Pretty, FNP Pharmacy:   [id number] - [place], [place] [address] Phone: [phone] Fax: [phone]     Social Determinants of Health (SDOH) Interventions    Readmission Risk Interventions No flowsheet data found.

## 2020-12-04 NOTE — Progress Notes (Signed)
OT Cancellation Note  Patient Details Name: Thomas Sandoval. MRN: [id number] DOB: 11/30/1955   Cancelled Treatment:    Reason Eval/Treat Not Completed: Medical issues which prohibited therapy. Patient transferred to ICU to manage alcohol withdrawals and delirium. Started on IV Precedex. Will hold evaluation today and f/u as able.  Lauriel Helin L Narvel Kozub 12/04/2020, 12:06 PM

## 2020-12-04 NOTE — Consult Note (Signed)
NAME:  Thomas Berberian., MRN:  [id number], DOB:  Apr 10, 1955, LOS: 2 ADMISSION DATE:  12/02/2020, CONSULTATION DATE: 12/05/2018 REFERRING MD: [facility], CHIEF COMPLAINT: Alcohol  History of Present Illness:  Thomas Sandoval is a 65 year old male who appears older than stated age and was admitted with acute pancreatitis noted to be consuming approximately 5 quarts of liquor per week and developed alcohol withdrawal on 12/04/2020 and was extremely combative and difficult to control.  He has an extensive past medical history is well-documented below.  Most notably has history of gastric ulcer which required surgical intervention.  Is also noted to have a new onset of atrial febrile ventricular response which is being controlled with beta-blockers.  Due to his excessive agitation with physical manifestations he is transferred to the intensive care unit and started on Precedex drip with obvious improvement within 30 minutes.  He will be continued on his current interventions until improved.  Pertinent  Medical History   Past Medical History:  Diagnosis Date   Arthritis    Bladder cancer ([place])    Chronic back pain    COPD (chronic obstructive pulmonary disease) (HCC)    DDD (degenerative disc disease)    Diabetic retinopathy of both eyes (HCC)    Essential hypertension    GERD (gastroesophageal reflux disease)    History of atrial flutter 02/2011   Converted to NSR with Cardizem   History of chronic bronchitis    History of hemolytic anemia 02/2011   secondary to Avelox   HOH (hard of hearing)    HOH (hard of hearing)    no eardrum and nerve damage on R, also HOH on L   Mitral valve prolapse    a. 2D Echo 11/27/14: EF 55-60%; images were inadequate for LV wall motion assessment, + mild late systolic mitral valve prolapse involving the anterior leaflet.   PAD (peripheral artery disease) ([place]) 04/2014   Dr Trula Slade; bilateral SFA occlusion, R mid, L distal   PAF (paroxysmal atrial fibrillation)  ([place])    a. Dx 11/2014 during admission for perf ulcer.   Perforated ulcer ([place])    a. 11/2014 s/p surgery.   Productive cough    Smokers' cough ([place])    Type 1 diabetes mellitus ([place]) [facility] Events: Including procedures, antibiotic start and stop dates in addition to other pertinent events   12/04/2020 transferred to ICU for severe alcohol withdrawal  Interim History / Subjective:  65 year old male in obvious alcoholic withdrawal questionable DTs with hallucinations atrial  Ventricular response transferred to intensive care unit for Precedex.  Objective   Blood pressure (!) 158/102, pulse (!) 110, temperature 97.6 F (36.4 C), temperature source Oral, resp. rate 20, height 5\' 11"  (1.803 m), weight 77.7 kg, SpO2 93 %.        Intake/Output Summary (Last 24 hours) at 12/04/2020 0958 Last data filed at 12/04/2020 0900 Gross per 24 hour  Intake 2903 ml  Output 3300 ml  Net -397 ml   Filed Weights   12/03/20 1228 12/03/20 2043  Weight: 77.7 kg 77.7 kg    Examination: General: 65 year old male (extremely hard of hearing and is better on Precedex HENT: No JVD or lymphadenopathy is appreciated Lungs: Lungs are essentially clear he does have retained secretions in oropharynx Cardiovascular: Heart sounds are regular Abdomen: Tender positive bowel sounds Extremities: No edema is here is dry and orally Neuro: Confused hard of hearing does appear to be able to track and follow GU: Condom  catheter is in place  Resolved Hospital Problem list     Assessment & Plan:  Altered mental status in the setting of daily alcohol consumption noted to be 5 quarts of liquor per week.  Admitted with severe pancreatitis developed agitation and confusion suspected hallucinations was transferred to the ICU 12/04/2020 for Precedex.  Precedex infusion Continue CIWA protocol Monitor the intensive care unit Monitor for  seizure Thiamine and folic acid Stop drinking  Pulmonary  congestion does not appear to be Check chest x-ray Avoid diuresis at this time  Pancreatitis Monitor liver enzymes  Atrial fibrillation with rapid ventricular response Beta-blocker for rate control Would not diurese at this time.  He appears dry. Check 2 d   Gastroesophageal reflux disease, history of perforated ulcer PPIs q 12h   Hyponatremia Recent Labs  Lab 12/02/20 1408 12/03/20 0500 12/04/20 0806  NA 131* 130* 138  Resolved   Diabetes mellitus CBG (last 3)  Recent Labs    12/04/20 0003 12/04/20 0654 12/04/20 0923  GLUCAP 159* 82 107*   Monitor for hypoglycemia ssi   Best Practice (right click and "Reselect all SmartList Selections" daily)   Diet/type: NPO DVT prophylaxis: LMWH GI prophylaxis: PPI Lines: N/A Foley:  N/A Code Status:  full code Last date of multidisciplinary goals of care discussion [TBD]  Labs   CBC: Recent Labs  Lab 12/02/20 1408 12/03/20 0500  WBC 8.2 10.2  NEUTROABS 7.5  --   HGB 16.5 15.5  HCT 46.4 45.9  MCV 102.9* 108.5*  PLT 186 135*    Basic Metabolic Panel: Recent Labs  Lab 12/02/20 1408 12/03/20 0500 12/04/20 0806  NA 131* 130* 138  K 4.3 4.2 3.5  CL 92* 95* 102  CO2 22 21* 27  GLUCOSE 193* 187* 93  BUN 19 15 8   CREATININE 1.22 1.17 0.92  CALCIUM 9.7 9.3 8.8*  MG  --  1.3*  --   PHOS  --  3.1  --    GFR: Estimated Creatinine Clearance: 87.5 mL/min (by C-G formula based on SCr of 0.92 mg/dL). Recent Labs  Lab 12/02/20 1408 12/02/20 1540 12/03/20 0500  WBC 8.2  --  10.2  LATICACIDVEN  --  1.9  --     Liver Function Tests: Recent Labs  Lab 12/02/20 1408 12/03/20 0500 12/04/20 0806  AST 44* 33 33  ALT 29 24 21   ALKPHOS 61 64 60  BILITOT 4.3* 2.5* 1.7*  PROT 7.3 6.2* 5.8*  ALBUMIN 3.8 3.6 3.3*   Recent Labs  Lab 12/02/20 1408 12/03/20 0500  LIPASE 317* 186*   No results for input(s): AMMONIA in the last 168 hours.  ABG    Component Value Date/Time   TCO2 23 08/25/2017 1631      Coagulation Profile: No results for input(s): INR, PROTIME in the last 168 hours.  Cardiac Enzymes: No results for input(s): CKTOTAL, CKMB, CKMBINDEX, TROPONINI in the last 168 hours.  HbA1C: HB A1C (BAYER DCA - WAIVED)  Date/Time Value Ref Range Status  10/25/2020 02:37 PM 5.5 <7.0 % Final    Comment:                                          Diabetic Adult            <7.0  Healthy Adult        4.3 - 5.7                                                           (DCCT/NGSP) American Diabetes Association's Summary of Glycemic Recommendations for Adults with Diabetes: Hemoglobin A1c <7.0%. More stringent glycemic goals (A1c <6.0%) may further reduce complications at the cost of increased risk of hypoglycemia.   07/25/2020 02:44 PM 6.3 <7.0 % Final    Comment:                                          Diabetic Adult            <7.0                                       Healthy Adult        4.3 - 5.7                                                           (DCCT/NGSP) American Diabetes Association's Summary of Glycemic Recommendations for Adults with Diabetes: Hemoglobin A1c <7.0%. More stringent glycemic goals (A1c <6.0%) may further reduce complications at the cost of increased risk of hypoglycemia.    Hgb A1c MFr Bld  Date/Time Value Ref Range Status  12/03/2020 05:00 AM 5.7 (H) 4.8 - 5.6 % Final    Comment:    (NOTE) Pre diabetes:          5.7%-6.4%  Diabetes:              >6.4%  Glycemic control for   <7.0% adults with diabetes     CBG: Recent Labs  Lab 12/03/20 1245 12/03/20 2042 12/04/20 0003 12/04/20 0654 12/04/20 0923  GLUCAP 287* 216* 159* 82 107*    Review of Systems:   na  Past Medical History:  He,  has a past medical history of Arthritis, Bladder cancer ([place]), Chronic back pain, COPD (chronic obstructive pulmonary disease) ([place]), DDD (degenerative disc disease), Diabetic retinopathy of both eyes ([place]),  Essential hypertension, GERD (gastroesophageal reflux disease), History of atrial flutter (02/2011), History of chronic bronchitis, History of hemolytic anemia (02/2011), HOH (hard of hearing), HOH (hard of hearing), Mitral valve prolapse, PAD (peripheral artery disease) ([place]) (04/2014), PAF (paroxysmal atrial fibrillation) ([place]), Perforated ulcer ([place]), Productive cough, Smokers' cough ([place]), and Type 1 diabetes mellitus ([place]) (1977).   Surgical History:   Past Surgical History:  Procedure Laterality Date   CYSTOSCOPY WITH URETEROSCOPY Right 08/14/2013   Procedure: CYSTOSCOPY WITH URETEROSCOPY BLADDER BIOPSY ;  Surgeon: Claybon Jabs, MD;  [facility];  Service: Urology;  Laterality: Right;   ESOPHAGOGASTRODUODENOSCOPY N/A 02/06/2013   Procedure: ESOPHAGOGASTRODUODENOSCOPY (EGD);  Surgeon: Lafayette Dragon, MD;  Location: [facility] ENDOSCOPY;  Service: Endoscopy;  Laterality: N/A;   LAPAROSCOPY N/A 11/25/2014   Procedure: LAPAROSCOPIC PRIMARY REPAIR OF PERFORATED PREPYLORIC ULCER WITH  Silvestre Gunner;  Surgeon: Greer Pickerel, MD;  Location: [facility];  Service: General;  Laterality: N/A;   TONSILLECTOMY  as child   TRANSTHORACIC ECHOCARDIOGRAM  02-17-2011   MODERATE LVH/  EF 65%   TRANSURETHRAL RESECTION OF BLADDER TUMOR WITH GYRUS (TURBT-GYRUS) N/A 06/12/2013   Procedure: TRANSURETHRAL RESECTION OF BLADDER TUMOR WITH GYRUS (TURBT-GYRUS);  Surgeon: Claybon Jabs, MD;  Location: [facility];  Service: Urology;  Laterality: N/A;   TYMPANIC MEMBRANE REPAIR  as child     Social History:   reports that he has been smoking cigarettes. He has a 60.00 pack-year smoking history. He quit smokeless tobacco use about 42 years ago. He reports current alcohol use. He reports that he does not use drugs.   Family History:  His family history includes Breast cancer in his mother; Cancer in his mother; Diabetes in his son; Heart attack in his father; Heart disease in his father; Rheumatic  fever in his father.   Allergies Allergies  Allergen Reactions   Azithromycin Other (See Comments) and Nausea And Vomiting    "Severe stomach cramps; told to list as an allergy by dr. Huel Cote ago"   Avelox [Moxifloxacin Hcl In Nacl] Other (See Comments)    Hemolysis  In 2012   Bactrim [Sulfamethoxazole-Trimethoprim] Diarrhea and Nausea And Vomiting     Home Medications  Prior to Admission medications   Medication Sig Start Date End Date Taking? Authorizing Provider  albuterol (PROVENTIL HFA;VENTOLIN HFA) 108 (90 Base) MCG/ACT inhaler Inhale 2 puffs every 6 (six) hours as needed into the lungs for wheezing or shortness of breath. 05/17/17  Yes Hassell Done, [name], FNP  aspirin 81 MG tablet Take 81 mg by mouth daily.   Yes [provider]  cetirizine (ZYRTEC) 10 MG tablet Take 10 mg by mouth daily as needed for allergies.   Yes [provider]  fluticasone (FLONASE) 50 MCG/ACT nasal spray SPRAY 2 SPRAYS INTO EACH NOSTRIL EVERY DAY Patient taking differently: Place 2 sprays into both nostrils daily as needed for allergies. 11/17/18  Yes Martin, [name], FNP  fluticasone-salmeterol (ADVAIR) 250-50 MCG/ACT AEPB Inhale 1 puff into the lungs in the morning and at bedtime.   Yes [provider]  Fluticasone-Umeclidin-Vilant (TRELEGY ELLIPTA) 100-62.5-25 MCG/INH AEPB Inhale 1 puff into the lungs 2 (two) times daily. 10/25/20  Yes Hassell Done, [name], FNP  HYDROcodone-acetaminophen (NORCO/VICODIN) 5-325 MG tablet Take 1 tablet by mouth in the morning and at bedtime. 11/25/20 12/25/20 Yes Martin, [name], FNP  insulin glargine (LANTUS) 100 UNIT/ML injection INJECT 0.4 MLS (40 UNITS TOTAL) INTO THE SKIN IN THE MORNING. Patient taking differently: Inject 40 Units into the skin daily. INJECT 0.4 MLS (40 UNITS TOTAL) INTO THE SKIN IN THE MORNING. 12/02/20  Yes Hassell Done, [name], FNP  lisinopril (ZESTRIL) 10 MG tablet Take 1 tablet (10 mg total) by mouth daily.  10/25/20  Yes Martin, [name], FNP  NOVOLOG 100 UNIT/ML injection PER SLIDING SCALE: 190 - 200 = 2 UNITS 300 AND ABOVE = 7 UNITS Patient taking differently: Inject 0-22 Units into the skin 3 (three) times daily with meals. Sliding scale 12/02/20  Yes Hassell Done, [name], FNP  doxycycline (VIBRA-TABS) 100 MG tablet Take 1 tablet (100 mg total) by mouth 2 (two) times daily. 1 po bid Patient taking differently: Take 100 mg by mouth 2 (two) times daily. 11/18/20   Hassell Done [name], FNP  famotidine (PEPCID) 20 MG tablet Take 1 tablet (20 mg total) by mouth every morning. Reported on 04/16/2015 10/25/20   Hassell Done,  [name], FNP  HYDROcodone-acetaminophen (NORCO/VICODIN) 5-325 MG tablet Take 1 tablet by mouth in the morning and at bedtime. Patient not taking: Reported on 12/02/2020 12/25/20 01/24/21  Chevis Pretty, FNP  Insulin Syringe-Needle U-100 (B-D INS SYR ULTRAFINE 1CC/30G) 30G X 1/2" 1 ML MISC Use 4 times a day with insulin Dx E11.9 08/22/20   Chevis Pretty, FNP  Insulin Syringes, Disposable, U-100 1 ML MISC Use 4 times a day for insulin injection Dx E11.9 12/27/18   Chevis Pretty, FNP     Critical care time:      Gaylyn Lambert ACNP Acute Care Nurse Practitioner [place] Please consult Amion 12/04/2020, 9:58 AM

## 2020-12-04 NOTE — Progress Notes (Addendum)
PROGRESS NOTE  Thomas Sandoval. ZTI:[id number] DOB: 04/11/1955 DOA: 12/02/2020 PCP: Chevis Pretty, FNP   LOS: 2 days   Brief narrative:  Thomas Sandoval. is a 65 years old male with past medical history of alcohol abuse, perforated ulcer, paroxysmal atrial fibrillation not on anticoagulation, type 2 diabetes mellitus, mitral valve prolapse, COPD, hyperlipidemia, hypertension, GERD and peripheral vascular disease presented to hospital with worsening abdominal pain for 2 days associated with nausea and vomiting.  In the ED he was noted to be having atrial fibrillation and flutter with RVR.  Lipase was elevated at 317 and CT scan of the abdomen showed acute pancreatitis.  Patient was started on Cardizem drip and given IV fluid hydration IV analgesics were administered and patient was admitted hospital for further evaluation and treatment.  Assessment/Plan:  Active Problems:   Atrial flutter/fib   Acute pancreatitis   Generalized abdominal pain   Alcoholism (HCC)  Acute alcoholic pancreatitis.  Patient presented with abdominal pain, lipase and CT scan showed acute pancreatitis.  Patient continues to drink daily.  We will continue conservative treatment including analgesics IV fluids antiemetics and will continue NPO.     Alcohol abuse with agitation confusion disorientation with ongoing withdrawal.  Patient has a history of drinking 5 quart of liquor per week.  Started on CIWA protocol but required multiple doses of the IV Ativan at least 8 mg in the morning.  Patient will benefit from transfer to ICU with Precedex drip.  Spoke with ICU attending for transfer to the ICU.  A flutter with RVR.  Rate had initially improved with Cardizem infusion which was discontinued but at this time patient is agitated and his heart rate is elevated as well.  This will need to be addressed on an ongoing basis.  Not on oral anticoagulation due to history of perforated ulcer.  Elevated LFTs.   Likely secondary to alcohol use.  T scan without any gallstones or CBD dilatation.  Bilirubin was slightly elevated.  This has trended down. Avoid hepatotoxins   Hyponatremia.  Improved at this time.  Thought to be secondary to alcohol use disorder.   History of perforated ulcer in 2016 status post repair. Continue PPI, continue to monitor.   Type 2 diabetes with hyperglycemia.   Last hemoglobin A1c of 5.7.  Continue to hold oral hypoglycemic agents.  Continue sliding scale insulin.   Ambulatory dysfunction with falls Secondary to alcohol abuse.  We will get a PT OT evaluation when patient is more stable. Continue fall precautions.  Patient with alcohol withdrawal symptoms with agitation difficult to control with breathing issues and A. fib with RVR.  Patient will be transferred to critical care service.  Spoke with Dr. Tacy Learn  DVT prophylaxis: enoxaparin (LOVENOX) injection 40 mg Start: 12/02/20 2000 SCDs Start: 12/02/20 1926   Code Status: Full code  Family Communication: None  Status is: Inpatient  Remains inpatient appropriate because:IV treatments appropriate due to intensity of illness or inability to take PO and Inpatient level of care appropriate due to severity of illness  Dispo:  Patient From: Home  Planned Disposition: Home  Medically stable for discharge: No     Consultants: PCCM  Procedures: None  Anti-infectives:  None  Anti-infectives (From admission, onward)    None      Subjective: I was called and at bedside to see the patient was restless agitated with trouble breathing.  Appeared extremely anxious and agitated with constant coughing.  Received 80 mg of IV Ativan  since the morning but still appeared to be quite agitated.  Objective: Vitals:   12/04/20 0541 12/04/20 0543  BP: (!) 158/102   Pulse: (!) 114 (!) 110  Resp: 20   Temp: 97.7 F (36.5 C)   SpO2: (!) 86% 93%    Intake/Output Summary (Last 24 hours) at 12/04/2020 0810 Last data  filed at 12/03/2020 2200 Gross per 24 hour  Intake 1993.76 ml  Output 0 ml  Net 1993.76 ml   Filed Weights   12/03/20 1228 12/03/20 2043  Weight: 77.7 kg 77.7 kg   Body mass index is 23.91 kg/m.   Physical Exam:  GENERAL: Patient was agitated disoriented confused with constant coughing, tossing in bed  HENT: No scleral pallor or icterus. Pupils equally reactive to light. Oral mucosa is moist NECK: is supple, no gross swelling noted. CHEST:   Diminished breath sounds bilaterally.  Coarse breath sounds noted.  Rhonchi noted. CVS: S1 and S2 heard, no murmur. Regular rate and rhythm.  ABDOMEN: Soft, non-tender, bowel sounds are present. EXTREMITIES: No edema. CNS: Moving all extremities.  Alert awake but disoriented, confused agitated. SKIN: warm and dry without rashes.  Data Review: I have personally reviewed the following laboratory data and studies,  CBC: Recent Labs  Lab 12/02/20 1408 12/03/20 0500  WBC 8.2 10.2  NEUTROABS 7.5  --   HGB 16.5 15.5  HCT 46.4 45.9  MCV 102.9* 108.5*  PLT 186 431*   Basic Metabolic Panel: Recent Labs  Lab 12/02/20 1408 12/03/20 0500  NA 131* 130*  K 4.3 4.2  CL 92* 95*  CO2 22 21*  GLUCOSE 193* 187*  BUN 19 15  CREATININE 1.22 1.17  CALCIUM 9.7 9.3  MG  --  1.3*  PHOS  --  3.1   Liver Function Tests: Recent Labs  Lab 12/02/20 1408 12/03/20 0500  AST 44* 33  ALT 29 24  ALKPHOS 61 64  BILITOT 4.3* 2.5*  PROT 7.3 6.2*  ALBUMIN 3.8 3.6   Recent Labs  Lab 12/02/20 1408 12/03/20 0500  LIPASE 317* 186*   No results for input(s): AMMONIA in the last 168 hours. Cardiac Enzymes: No results for input(s): CKTOTAL, CKMB, CKMBINDEX, TROPONINI in the last 168 hours. BNP (last 3 results) No results for input(s): BNP in the last 8760 hours.  ProBNP (last 3 results) No results for input(s): PROBNP in the last 8760 hours.  CBG: Recent Labs  Lab 12/03/20 0032 12/03/20 0643 12/03/20 0835 12/03/20 1245 12/03/20 2042   GLUCAP 228* 193* 206* 287* 216*   Recent Results (from the past 240 hour(s))  Resp Panel by RT-PCR (Flu A&B, Covid) Nasopharyngeal Swab     Status: None   Collection Time: 12/02/20  6:18 PM   Specimen: Nasopharyngeal Swab; Nasopharyngeal(NP) swabs in vial transport medium  Result Value Ref Range Status   SARS Coronavirus 2 by RT PCR NEGATIVE NEGATIVE Final    Comment: (NOTE) SARS-CoV-2 target nucleic acids are NOT DETECTED.  The SARS-CoV-2 RNA is generally detectable in upper respiratory specimens during the acute phase of infection. The lowest concentration of SARS-CoV-2 viral copies this assay can detect is 138 copies/mL. A negative result does not preclude SARS-Cov-2 infection and should not be used as the sole basis for treatment or other patient management decisions. A negative result may occur with  improper specimen collection/handling, submission of specimen other than nasopharyngeal swab, presence of viral mutation(s) within the areas targeted by this assay, and inadequate number of viral copies(<138 copies/mL). A negative  result must be combined with clinical observations, patient history, and epidemiological information. The expected result is Negative.  Fact Sheet for Patients:  EntrepreneurPulse.com.au  Fact Sheet for Healthcare Providers:  IncredibleEmployment.be  This test is no t yet approved or cleared by the [place] FDA and  has been authorized for detection and/or diagnosis of SARS-CoV-2 by FDA under an Emergency Use Authorization (EUA). This EUA will remain  in effect (meaning this test can be used) for the duration of the COVID-19 declaration under Section 564(b)(1) of the Act, 21 U.S.C.section 360bbb-3(b)(1), unless the authorization is terminated  or revoked sooner.       Influenza A by PCR NEGATIVE NEGATIVE Final   Influenza B by PCR NEGATIVE NEGATIVE Final    Comment: (NOTE) The Xpert Xpress  SARS-CoV-2/FLU/RSV plus assay is intended as an aid in the diagnosis of influenza from Nasopharyngeal swab specimens and should not be used as a sole basis for treatment. Nasal washings and aspirates are unacceptable for Xpert Xpress SARS-CoV-2/FLU/RSV testing.  Fact Sheet for Patients: EntrepreneurPulse.com.au  Fact Sheet for Healthcare Providers: IncredibleEmployment.be  This test is not yet approved or cleared by the [place] FDA and has been authorized for detection and/or diagnosis of SARS-CoV-2 by FDA under an Emergency Use Authorization (EUA). This EUA will remain in effect (meaning this test can be used) for the duration of the COVID-19 declaration under Section 564(b)(1) of the Act, 21 U.S.C. section 360bbb-3(b)(1), unless the authorization is terminated or revoked.  Performed at [facility], [place] [address], [place], [place] [address]      Studies: CT ABDOMEN PELVIS W CONTRAST  Result Date: 12/02/2020 CLINICAL DATA:  Abdominal pain, acute, nonlocalized. EXAM: CT ABDOMEN AND PELVIS WITH CONTRAST TECHNIQUE: Multidetector CT imaging of the abdomen and pelvis was performed using the standard protocol following bolus administration of intravenous contrast. CONTRAST:  175mL OMNIPAQUE IOHEXOL 300 MG/ML  SOLN COMPARISON:  11/25/2014 FINDINGS: Lower chest: Lung bases are clear. Hepatobiliary: Slightly decreased attenuation of the liver. No discrete liver lesion. Main portal venous system is patent. Normal appearance of the gallbladder without biliary dilatation. Pancreas: Mild stranding around the pancreatic head and descending duodenum. No significant pancreatic duct dilatation. Spleen: Normal in size without focal abnormality. Adrenals/Urinary Tract: Normal adrenal glands. Small exophytic low-density structure in the right kidney likely represents a small cyst. No hydronephrosis. Normal appearance of the urinary bladder. Small left renal  calcifications that are probably not within the collecting system. Stomach/Bowel: Edema and/or trace fluid around the proximal duodenum and descending duodenum. No evidence for a bowel obstruction. Vascular/Lymphatic: Chronic occlusion in the proximal right SFA. Diffuse atherosclerotic disease in the abdomen and pelvis. Negative for an aortic aneurysm. No lymph node enlargement in the abdomen or pelvis. Reproductive: Prostate is unremarkable. Other: No pelvic ascites. Umbilical hernia containing fat. Negative for free air. Musculoskeletal: Multilevel disc space narrowing in the lumbar spine. No acute bone abnormality. IMPRESSION: 1. Inflammation and stranding centered around the pancreatic head and proximal duodenum. Differential diagnosis includes pancreatitis and duodenitis. No evidence for free air or focal fluid collections. 2. Diffuse atherosclerotic disease in the abdomen and pelvis. 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 3. Small left renal calcifications without hydronephrosis. These calcifications could be vascular in etiology and post inflammatory. Electronically Signed   By: Markus Daft M.D.   On: 12/02/2020 17:54   DG Abd Acute W/Chest  Result Date: 12/02/2020 CLINICAL DATA:  Abdomen pain EXAM: DG ABDOMEN ACUTE WITH 1 VIEW  CHEST COMPARISON:  11/25/2014 FINDINGS: Single-view chest demonstrates no focal opacity or pleural effusion. Mild diffuse reticular opacity likely due to chronic lung disease. Scarring at the apices. Normal cardiac size. Supine and upright views of the abdomen demonstrate no free air beneath the diaphragm. Nonobstructed bowel-gas pattern. Probable small left kidney stones. Scoliosis and degenerative changes of the spine IMPRESSION: 1. Coarse interstitial opacity consistent with chronic disease. No acute airspace disease 2. Nonobstructed gas pattern 3. Probable left kidney stones Electronically Signed   By: Donavan Foil M.D.   On: 12/02/2020 15:23       Flora Lipps, MD  [facility] 12/04/2020  If 7PM-7AM, please contact night-coverage

## 2020-12-04 NOTE — Progress Notes (Signed)
Report called to 93M for transfer of patient.    Aurther Loft, RN

## 2020-12-04 NOTE — Progress Notes (Addendum)
HOSPITAL MEDICINE OVERNIGHT EVENT NOTE    Nursing reports the patient has become increasingly agitated throughout the evening.  Patient has not been following commands, attempting to get out of bed and even becoming quite aggressive with staff.  Nursing reports that patient has been exhibiting ongoing tremors, diaphoresis restlessness and intermittent hallucinations.  Patient received 2 doses of as needed Ativan within the past few hours with little to no effect.  I am concerned the patient is exhibiting progressive alcohol withdrawal and is at real risk of developing delirium tremens if he has not already.  I have advised nursing to give an additional 4 mg of intravenous Ativan now.  Frequent effective dosing of intravenous Ativan will be required to get ahead of the withdrawal.  Following patient and coordinating with nursing closely.  Thomas Emerald  MD [facility]   ADDENDUM (9/28 3:05AM)  Severe agitation/tremulousness is improving after additional dose of 4mg  IV Ativan.  Continue to monitor via CIWA protocol and dose benzodiazepines as needed.   Thomas Sandoval

## 2020-12-05 ENCOUNTER — Inpatient Hospital Stay (HOSPITAL_COMMUNITY): Payer: 59

## 2020-12-05 DIAGNOSIS — F10231 Alcohol dependence with withdrawal delirium: Secondary | ICD-10-CM

## 2020-12-05 DIAGNOSIS — I4892 Unspecified atrial flutter: Secondary | ICD-10-CM

## 2020-12-05 DIAGNOSIS — G9341 Metabolic encephalopathy: Secondary | ICD-10-CM

## 2020-12-05 LAB — ECHOCARDIOGRAM COMPLETE
AR max vel: 2.35 cm2
AV Area VTI: 2.16 cm2
AV Area mean vel: 2.2 cm2
AV Mean grad: 1 mmHg
AV Peak grad: 2 mmHg
Ao pk vel: 0.71 m/s
Calc EF: 37.7 %
Height: 71 in
S' Lateral: 4.1 cm
Single Plane A2C EF: 45.7 %
Single Plane A4C EF: 33.7 %
Weight: 2742.42 oz

## 2020-12-05 LAB — CBC WITH DIFFERENTIAL/PLATELET
Abs Immature Granulocytes: 0.02 10*3/uL (ref 0.00–0.07)
Basophils Absolute: 0 10*3/uL (ref 0.0–0.1)
Basophils Relative: 0 %
Eosinophils Absolute: 0.2 10*3/uL (ref 0.0–0.5)
Eosinophils Relative: 3 %
HCT: 40.6 % (ref 39.0–52.0)
Hemoglobin: 14.3 g/dL (ref 13.0–17.0)
Immature Granulocytes: 0 %
Lymphocytes Relative: 12 %
Lymphs Abs: 0.8 10*3/uL (ref 0.7–4.0)
MCH: 37 pg — ABNORMAL HIGH (ref 26.0–34.0)
MCHC: 35.2 g/dL (ref 30.0–36.0)
MCV: 104.9 fL — ABNORMAL HIGH (ref 80.0–100.0)
Monocytes Absolute: 0.6 10*3/uL (ref 0.1–1.0)
Monocytes Relative: 9 %
Neutro Abs: 5.1 10*3/uL (ref 1.7–7.7)
Neutrophils Relative %: 76 %
Platelets: 99 10*3/uL — ABNORMAL LOW (ref 150–400)
RBC: 3.87 MIL/uL — ABNORMAL LOW (ref 4.22–5.81)
RDW: 12.3 % (ref 11.5–15.5)
WBC: 6.7 10*3/uL (ref 4.0–10.5)
nRBC: 0 % (ref 0.0–0.2)

## 2020-12-05 LAB — BASIC METABOLIC PANEL
Anion gap: 10 (ref 5–15)
BUN: 9 mg/dL (ref 8–23)
CO2: 24 mmol/L (ref 22–32)
Calcium: 8.7 mg/dL — ABNORMAL LOW (ref 8.9–10.3)
Chloride: 102 mmol/L (ref 98–111)
Creatinine, Ser: 0.89 mg/dL (ref 0.61–1.24)
GFR, Estimated: >60 mL/min (ref >60)
Glucose, Bld: 108 mg/dL — ABNORMAL HIGH (ref 70–99)
Potassium: 3.4 mmol/L — ABNORMAL LOW (ref 3.5–5.1)
Sodium: 136 mmol/L (ref 135–145)

## 2020-12-05 LAB — COMPREHENSIVE METABOLIC PANEL
ALT: 19 U/L (ref 0–44)
AST: 27 U/L (ref 15–41)
Albumin: 2.8 g/dL — ABNORMAL LOW (ref 3.5–5.0)
Alkaline Phosphatase: 62 U/L (ref 38–126)
Anion gap: 8 (ref 5–15)
BUN: 10 mg/dL (ref 8–23)
CO2: 27 mmol/L (ref 22–32)
Calcium: 8.7 mg/dL — ABNORMAL LOW (ref 8.9–10.3)
Chloride: 99 mmol/L (ref 98–111)
Creatinine, Ser: 1.09 mg/dL (ref 0.61–1.24)
GFR, Estimated: >60 mL/min (ref >60)
Glucose, Bld: 296 mg/dL — ABNORMAL HIGH (ref 70–99)
Potassium: 3.9 mmol/L (ref 3.5–5.1)
Sodium: 134 mmol/L — ABNORMAL LOW (ref 135–145)
Total Bilirubin: 1.7 mg/dL — ABNORMAL HIGH (ref 0.3–1.2)
Total Protein: 5.6 g/dL — ABNORMAL LOW (ref 6.5–8.1)

## 2020-12-05 LAB — PHOSPHORUS: Phosphorus: 3.8 mg/dL (ref 2.5–4.6)

## 2020-12-05 LAB — GLUCOSE, CAPILLARY
Glucose-Capillary: 73 mg/dL (ref 70–99)
Glucose-Capillary: 168 mg/dL — ABNORMAL HIGH (ref 70–99)
Glucose-Capillary: 312 mg/dL — ABNORMAL HIGH (ref 70–99)
Glucose-Capillary: 309 mg/dL — ABNORMAL HIGH (ref 70–99)

## 2020-12-05 LAB — MAGNESIUM: Magnesium: 1.7 mg/dL (ref 1.7–2.4)

## 2020-12-05 MED ORDER — POTASSIUM CHLORIDE 10 MEQ/100ML IV SOLN
10.0000 meq | INTRAVENOUS | Status: AC
  Administered 2020-12-05 (×4): 10 meq via INTRAVENOUS
  Filled 2020-12-05 (×4): qty 100

## 2020-12-05 MED ORDER — OXYCODONE HCL 5 MG PO TABS
5.0000 mg | ORAL_TABLET | Freq: Once | ORAL | Status: AC
  Administered 2020-12-05: 5 mg via ORAL
  Filled 2020-12-05: qty 1

## 2020-12-05 MED ORDER — POTASSIUM CHLORIDE CRYS ER 20 MEQ PO TBCR
40.0000 meq | EXTENDED_RELEASE_TABLET | Freq: Once | ORAL | Status: AC
  Administered 2020-12-05: 40 meq via ORAL
  Filled 2020-12-05: qty 2

## 2020-12-05 MED ORDER — FOLIC ACID 1 MG PO TABS
1.0000 mg | ORAL_TABLET | Freq: Every day | ORAL | Status: DC
  Administered 2020-12-05 – 2020-12-06 (×2): 1 mg via ORAL
  Filled 2020-12-05 (×2): qty 1

## 2020-12-05 MED ORDER — PANTOPRAZOLE SODIUM 40 MG PO TBEC
40.0000 mg | DELAYED_RELEASE_TABLET | Freq: Two times a day (BID) | ORAL | Status: DC
  Administered 2020-12-05 – 2020-12-06 (×2): 40 mg via ORAL
  Filled 2020-12-05 (×2): qty 1

## 2020-12-05 MED ORDER — FLUTICASONE FUROATE-VILANTEROL 200-25 MCG/INH IN AEPB
1.0000 | INHALATION_SPRAY | Freq: Every day | RESPIRATORY_TRACT | Status: DC
  Administered 2020-12-06: 1 via RESPIRATORY_TRACT
  Filled 2020-12-05: qty 28

## 2020-12-05 MED ORDER — MAGNESIUM SULFATE 2 GM/50ML IV SOLN
2.0000 g | Freq: Once | INTRAVENOUS | Status: AC
  Administered 2020-12-05: 2 g via INTRAVENOUS
  Filled 2020-12-05: qty 50

## 2020-12-05 MED ORDER — INSULIN GLARGINE-YFGN 100 UNIT/ML ~~LOC~~ SOLN
10.0000 [IU] | Freq: Every day | SUBCUTANEOUS | Status: DC
Start: 2020-12-05 — End: 2020-12-06
  Administered 2020-12-05: 10 [IU] via SUBCUTANEOUS
  Filled 2020-12-05: qty 0.1

## 2020-12-05 MED ORDER — IPRATROPIUM BROMIDE 0.02 % IN SOLN
0.5000 mg | RESPIRATORY_TRACT | Status: DC | PRN
Start: 2020-12-05 — End: 2020-12-05

## 2020-12-05 MED ORDER — NICOTINE 14 MG/24HR TD PT24
14.0000 mg | MEDICATED_PATCH | Freq: Every day | TRANSDERMAL | Status: DC
  Administered 2020-12-05: 14 mg via TRANSDERMAL
  Filled 2020-12-05 (×2): qty 1

## 2020-12-05 MED ORDER — UMECLIDINIUM BROMIDE 62.5 MCG/INH IN AEPB
1.0000 | INHALATION_SPRAY | Freq: Every day | RESPIRATORY_TRACT | Status: DC
  Administered 2020-12-06: 1 via RESPIRATORY_TRACT
  Filled 2020-12-05: qty 7

## 2020-12-05 NOTE — Progress Notes (Signed)
  Echocardiogram 2D Echocardiogram has been performed.  Merrie Roof F 12/05/2020, 10:57 AM

## 2020-12-05 NOTE — Evaluation (Signed)
Occupational Therapy Evaluation Patient Details Name: Thomas Sandoval. MRN: [id number] DOB: 01/21/1956 Today's Date: 12/05/2020   History of Present Illness Pt is a 65 y/o male admitted 9/26 secondary to SOB and abdominal pain. Found to have acute pancreatitis and was in a fib with RVR. PMH includes alcohol abuse, bladder cancer, a fib, DM, COPD, and HTN.   Clinical Impression   Patient admitted for the diagnosis above.  PTA he lives with his spouse, who is able to assist as needed.  Deficits impacting independence are listed below.  Currently he remains in ICU for ETOH withdrawal, but was able to mobilize with Min A, and needed up to Max A for lower body ADL due to continued mild sedation.  Sedation is being weaned, and he should be able to return home with possible [facility] services when medically stable for discharge.  OT to continue in the acute setting to maximize functional status.       Recommendations for follow up therapy are one component of a multi-disciplinary discharge planning process, led by the attending physician.  Recommendations may be updated based on patient status, additional functional criteria and insurance authorization.   Follow Up Recommendations  Home health OT;Supervision/Assistance - 24 hour    Equipment Recommendations       Recommendations for Other Services       Precautions / Restrictions Precautions Precautions: Fall Restrictions Weight Bearing Restrictions: No      Mobility Bed Mobility Overal bed mobility: Needs Assistance Bed Mobility: Supine to Sit     Supine to sit: Min guard       Patient Response: Cooperative  Transfers Overall transfer level: Needs assistance Equipment used: 1 person hand held assist Transfers: Sit to/from [facility] Sit to Stand: Min assist Stand pivot transfers: Min assist            Balance Overall balance assessment: Needs assistance Sitting-balance support: Single extremity  supported;Feet supported Sitting balance-Leahy Scale: Fair     Standing balance support: Single extremity supported Standing balance-Leahy Scale: Poor Standing balance comment: needing external assist and cues                           ADL either performed or assessed with clinical judgement   ADL Overall ADL's : Needs assistance/impaired Eating/Feeding: Minimal assistance;Sitting   Grooming: Minimal assistance;Sitting   Upper Body Bathing: Moderate assistance;Sitting   Lower Body Bathing: Maximal assistance;Sit to/from stand   Upper Body Dressing : Moderate assistance;Sitting   Lower Body Dressing: Maximal assistance;Sitting/lateral leans   Toilet Transfer: Minimal assistance;Stand-pivot;BSC           Functional mobility during ADLs: Minimal assistance;Cueing for safety       Vision Patient Visual Report: No change from baseline                  Pertinent Vitals/Pain Pain Assessment: Faces Faces Pain Scale: Hurts a little bit Pain Location: low back Pain Descriptors / Indicators: Aching Pain Intervention(s): Monitored during session     Hand Dominance Right   Extremity/Trunk Assessment Upper Extremity Assessment Upper Extremity Assessment: Generalized weakness   Lower Extremity Assessment Lower Extremity Assessment: Defer to PT evaluation   Cervical / Trunk Assessment Cervical / Trunk Assessment: Normal   Communication Communication Communication: No difficulties   Cognition Arousal/Alertness: Lethargic;Suspect due to medications Behavior During Therapy: Flat affect Overall Cognitive Status: Impaired/Different from baseline Area of Impairment: Safety/judgement;Following commands;Memory;Orientation;Attention  Orientation Level: Disoriented to;Place;Time Current Attention Level: Sustained Memory: Decreased short-term memory Following Commands: Follows one step commands with increased time Safety/Judgement:  Decreased awareness of deficits;Decreased awareness of safety                          Home Living Family/patient expects to be discharged to:: Private residence Living Arrangements: Spouse/significant other Available Help at Discharge: Family;Available 24 hours/day Type of Home: House Home Access: Stairs to enter [employer] of Steps: 4-5 Entrance Stairs-Rails: Left Home Layout: Other (Comment)     Bathroom Shower/Tub: Teacher, early years/pre: Standard     Home Equipment: Clinical cytogeneticist - 2 wheels;Crutches          Prior Functioning/Environment Level of Independence: Independent                 OT Problem List: Decreased strength;Decreased activity tolerance;Impaired balance (sitting and/or standing);Decreased knowledge of use of DME or AE;Decreased safety awareness;Pain      OT Treatment/Interventions: Self-care/ADL training;Therapeutic exercise;Therapeutic activities;DME and/or AE instruction;Patient/family education;Balance training    OT Goals(Current goals can be found in the care plan section) Acute Rehab OT Goals Patient Stated Goal: to go home OT Goal Formulation: With patient Time For Goal Achievement: 12/19/20 Potential to Achieve Goals: Good ADL Goals Pt Will Perform Grooming: with set-up;sitting Pt Will Perform Lower Body Bathing: with min guard assist;sit to/from stand Pt Will Perform Upper Body Dressing: with set-up;sitting Pt Will Perform Lower Body Dressing: with min guard assist;sit to/from stand Pt Will Transfer to Toilet: with supervision;ambulating;regular height toilet Pt Will Perform Toileting - Clothing Manipulation and hygiene: with supervision;sit to/from stand;sitting/lateral leans  OT Frequency: Min 2X/week   Barriers to D/C:    none noted       Co-evaluation              AM-PAC OT "6 Clicks" Daily Activity     Outcome Measure Help from another person eating meals?: A Little Help from  another person taking care of personal grooming?: A Little Help from another person toileting, which includes using toliet, bedpan, or urinal?: A Lot Help from another person bathing (including washing, rinsing, drying)?: A Lot Help from another person to put on and taking off regular upper body clothing?: A Lot Help from another person to put on and taking off regular lower body clothing?: A Lot 6 Click Score: 14   End of Session Equipment Utilized During Treatment: Oxygen  Activity Tolerance: Patient limited by lethargy Patient left: in chair;with call bell/phone within reach;with chair alarm set;with nursing/sitter in room  OT Visit Diagnosis: Unsteadiness on feet (R26.81);Muscle weakness (generalized) (M62.81);Pain                Time: 1201-1220 OT Time Calculation (min): 19 min Charges:  OT General Charges $OT Visit: 1 Visit OT Evaluation $OT Eval Moderate Complexity: 1 Mod  12/05/2020  RP, OTR/L  Acute Rehabilitation Services  Office:  [phone]   Metta Clines 12/05/2020, 12:31 PM

## 2020-12-05 NOTE — Progress Notes (Signed)
Results for GARELD, OBRECHT (MRN [id number]) as of 12/05/2020 15:34  Ref. Range 12/04/2020 11:22 12/04/2020 15:00 12/04/2020 22:17 12/05/2020 07:51 12/05/2020 11:08  Glucose-Capillary Latest Ref Range: 70 - 99 mg/dL 172 (H) 100 (H) 119 (H) 73 168 (H)  Staff RN noted that Semglee was decreased to 10 units daily starting today. Paged diabetes coordinator about insulin dosages.   Inpatient Diabetes Recommendations:  If blood sugars begin to increase with decreased dosage of Semglee, recommend adding Novolog 3 units TID with meals if patient eats at least 50% of meal. Titrate dosages as needed.  Will continue to monitor blood sugars while in the hospital.  Harvel Ricks RN BSN CDE Diabetes Coordinator Pager: [phone]  8am-5pm

## 2020-12-05 NOTE — Progress Notes (Signed)
Physical Therapy Treatment Patient Details Name: Thomas Sandoval. MRN: [id number] DOB: [birth year] Today's Date: 12/05/2020   History of Present Illness Pt is a 65 y/o male admitted 9/26 secondary to SOB and abdominal pain. Found to have acute pancreatitis and was in a fib with RVR. PMH includes alcohol abuse, bladder cancer, a fib, DM, COPD, and HTN.    PT Comments    Pt agreeable.  Emphasis on transitions, sit to stand technique/safety, progression of gait with the RW, all with min to min guard assist.  VSS on RA.    Recommendations for follow up therapy are one component of a multi-disciplinary discharge planning process, led by the attending physician.  Recommendations may be updated based on patient status, additional functional criteria and insurance authorization.  Follow Up Recommendations  Other (comment);Supervision/Assistance - 24 hour (HH v NO f/u based on progression)     Equipment Recommendations  None recommended by PT    Recommendations for Other Services       Precautions / Restrictions Precautions Precautions: Fall Restrictions Weight Bearing Restrictions: No     Mobility  Bed Mobility Overal bed mobility: Needs Assistance Bed Mobility: Supine to Sit     Supine to sit: Min guard     General bed mobility comments: Min guard for safety and line management.    Transfers Overall transfer level: Needs assistance Equipment used: Rolling walker (2 wheeled) Transfers: Sit to/from Stand Sit to Stand: Min assist         General transfer comment: cues for hand placement, assist forward.  Ambulation/Gait Ambulation/Gait assistance: Min assist Gait Distance (Feet): 160 Feet Assistive device: Rolling walker (2 wheeled) Gait Pattern/deviations: Step-through pattern Gait velocity: Decreased Gait velocity interpretation: <1.8 ft/sec, indicate of risk for recurrent falls General Gait Details: mildly unsteady, flexed posture, moderate use of the RW,   VSS, sats in low 90's on RA   Stairs             Wheelchair Mobility    Modified Rankin (Stroke Patients Only)       Balance     Sitting balance-Leahy Scale: Fair       Standing balance-Leahy Scale: Poor Standing balance comment: needing external assist and cues                            Cognition Arousal/Alertness: Awake/alert Behavior During Therapy: Flat affect Overall Cognitive Status: Impaired/Different from baseline                   Orientation Level: Disoriented to;Place;Time Current Attention Level: Sustained Memory: Decreased short-term memory Following Commands: Follows one step commands with increased time Safety/Judgement: Decreased awareness of deficits;Decreased awareness of safety            Exercises      General Comments General comments (skin integrity, edema, etc.): IV pole assist from dtr.  wife present.      Pertinent Vitals/Pain Pain Assessment: Faces Faces Pain Scale: Hurts little more Pain Location: low back Pain Descriptors / Indicators: Aching Pain Intervention(s): Monitored during session    Home Living                      Prior Function            PT Goals (current goals can now be found in the care plan section) Acute Rehab PT Goals PT Goal Formulation: With patient Time For Goal Achievement: 12/17/20 Potential  to Achieve Goals: Good Progress towards PT goals: Progressing toward goals    Frequency    Min 3X/week      PT Plan Current plan remains appropriate    Co-evaluation              AM-PAC PT "6 Clicks" Mobility   Outcome Measure  Help needed turning from your back to your side while in a flat bed without using bedrails?: A Little Help needed moving from lying on your back to sitting on the side of a flat bed without using bedrails?: A Little Help needed moving to and from a bed to a chair (including a wheelchair)?: A Little Help needed standing up from a  chair using your arms (e.g., wheelchair or bedside chair)?: A Little Help needed to walk in hospital room?: A Little Help needed climbing 3-5 steps with a railing? : A Little 6 Click Score: 18    End of Session   Activity Tolerance: Patient tolerated treatment well Patient left: in bed;with bed alarm set;with call bell/phone within reach;with family/visitor present Nurse Communication: Mobility status PT Visit Diagnosis: Unsteadiness on feet (R26.81);Muscle weakness (generalized) (M62.81)     Time: 6045-4098 PT Time Calculation (min) (ACUTE ONLY): 22 min  Charges:  $Gait Training: 8-22 mins                     12/05/2020  Ginger Carne., PT Acute Rehabilitation Services [phone]  (pager) [phone]  (office)   Thomas Sandoval 12/05/2020, 6:06 PM

## 2020-12-05 NOTE — Progress Notes (Signed)
K+ 3.4, Mg 1.7 Replaced per protocol

## 2020-12-05 NOTE — Progress Notes (Signed)
[facility] Progress Note Patient Name: Thomas Sandoval. DOB: 1956/01/22 MRN: [id number]   Date of Service  12/05/2020  HPI/Events of Note  Patient c/o chronic back pain.   eICU Interventions  Plan: Oxycodone 5 mg PO X 1 now.      Intervention Category Major Interventions: Other:  Lysle Dingwall 12/05/2020, 9:16 PM

## 2020-12-05 NOTE — Progress Notes (Addendum)
NAME:  Thomas Reichart., MRN:  [id number], DOB:  08/23/1955, LOS: 3 ADMISSION DATE:  12/02/2020, CONSULTATION DATE: 12/05/2018 REFERRING MD: [facility], CHIEF COMPLAINT: Alcohol  History of Present Illness:  Thomas Sandoval is a 65 year old male who appears older than stated age and was admitted with acute pancreatitis noted to be consuming approximately 5 quarts of liquor per week and developed alcohol withdrawal on 12/04/2020 and was extremely combative and difficult to control.  Thomas Sandoval has an extensive past medical history is well-documented below.  Most notably has history of gastric ulcer which required surgical intervention.  Is also noted to have a new onset of atrial febrile ventricular response which is being controlled with beta-blockers.  Due to his excessive agitation with physical manifestations Thomas Sandoval is transferred to the intensive care unit and started on Precedex drip with obvious improvement within 30 minutes.  Thomas Sandoval will be continued on his current interventions until improved.  Pertinent  Medical History   Past Medical History:  Diagnosis Date   Arthritis    Bladder cancer ([place])    Chronic back pain    COPD (chronic obstructive pulmonary disease) (HCC)    DDD (degenerative disc disease)    Diabetic retinopathy of both eyes (HCC)    Essential hypertension    GERD (gastroesophageal reflux disease)    History of atrial flutter 02/2011   Converted to NSR with Cardizem   History of chronic bronchitis    History of hemolytic anemia 02/2011   secondary to Avelox   HOH (hard of hearing)    HOH (hard of hearing)    no eardrum and nerve damage on R, also HOH on L   Mitral valve prolapse    a. 2D Echo 11/27/14: EF 55-60%; images were inadequate for LV wall motion assessment, + mild late systolic mitral valve prolapse involving the anterior leaflet.   PAD (peripheral artery disease) ([place]) 04/2014   Dr Trula Slade; bilateral SFA occlusion, R mid, L distal   PAF (paroxysmal atrial fibrillation)  ([place])    a. Dx 11/2014 during admission for perf ulcer.   Perforated ulcer ([place])    a. 11/2014 s/p surgery.   Productive cough    Smokers' cough ([place])    Type 1 diabetes mellitus ([place]) [place] [facility] Events: Including procedures, antibiotic start and stop dates in addition to other pertinent events   12/04/2020 transferred to ICU for severe alcohol withdrawal  Interim History / Subjective:  Patient on precedex 0.3. Does not appear agitated. Pleasantly confused. Able to follow commands.  Afib with rate in 70s.  Receiving K and Mag this am  On 1 L [place]   Objective   Blood pressure 118/75, pulse 76, temperature 98.6 F (37 C), temperature source Axillary, resp. rate (!) 24, height 5\' 11"  (1.803 m), weight 77.7 kg, SpO2 99 %.        Intake/Output Summary (Last 24 hours) at 12/05/2020 0709 Last data filed at 12/05/2020 3329 Gross per 24 hour  Intake 1820 ml  Output 3900 ml  Net -2080 ml    Filed Weights   12/03/20 1228 12/03/20 2043  Weight: 77.7 kg 77.7 kg    Examination: General: ill appearing male on [place] HEENT: MM pink/moist; [place] in place; no jvd Neuro: Aox3; pleasant/confused; no agitation; on Precedex 0.3 CV: s1s2, afib rate in 70s, no m/r/g PULM:  dim crackles in bases BS bilaterally; [place] 2 L GI: soft, bsx4 active; mild diffuse pain upon deep palpation of upper quadrants  Extremities: warm/dry, no edema  Skin: no rashes or lesions    Resolved Hospital Problem list    Hyponatremia  Assessment & Plan:  Altered mental status in the setting of daily alcohol consumption noted to be 5 quarts of liquor per week.   Agitation Delirium w/ suspected hallucinations:transferred to the ICU 12/04/2020 for Precedex. P: -continue to monitor in ICU while on precedex -continue to wean precedex infusion -CIWA protocol -continue to monitor for any seizures -continue thiamine/folate  Severe pancreatitis P: -trend CMP -prn analgesics for pain -will need education  before discharge to stop drinking  Pulmonary congestion  P: -Trend chest x-ray -Pulm toilet: IS and flutter  Atrial fibrillation with rapid ventricular response Hx of HTN P: -rate in the 80s -continue BB  Gastroesophageal reflux disease, history of perforated ulcer P: -PPI Q12  Diabetes mellitus type 1 P: -SSI and CBG monitoring  Hx of COPD:  trelegy ellipta and prn albuterol at home Hx of smoking: 60 PYH P: -will start Breo/Incruse -prn xopenex for wheezing -nicotine patch  Best Practice (right click and "Reselect all SmartList Selections" daily)   Diet/type: NPO DVT prophylaxis: LMWH GI prophylaxis: PPI Lines: N/A Foley:  N/A Code Status:  full code Last date of multidisciplinary goals of care discussion [pending]  Labs   CBC: Recent Labs  Lab 12/02/20 1408 12/03/20 0500 12/05/20 0353  WBC 8.2 10.2 6.7  NEUTROABS 7.5  --  5.1  HGB 16.5 15.5 14.3  HCT 46.4 45.9 40.6  MCV 102.9* 108.5* 104.9*  PLT 186 135* 99*     Basic Metabolic Panel: Recent Labs  Lab 12/02/20 1408 12/03/20 0500 12/04/20 0806 12/05/20 0353  NA 131* 130* 138 136  K 4.3 4.2 3.5 3.4*  CL 92* 95* 102 102  CO2 22 21* 27 24  GLUCOSE 193* 187* 93 108*  BUN 19 15 8 9   CREATININE 1.22 1.17 0.92 0.89  CALCIUM 9.7 9.3 8.8* 8.7*  MG  --  1.3*  --  1.7  PHOS  --  3.1  --  3.8    GFR: Estimated Creatinine Clearance: 90.5 mL/min (by C-G formula based on SCr of 0.89 mg/dL). Recent Labs  Lab 12/02/20 1408 12/02/20 1540 12/03/20 0500 12/05/20 0353  WBC 8.2  --  10.2 6.7  LATICACIDVEN  --  1.9  --   --      Liver Function Tests: Recent Labs  Lab 12/02/20 1408 12/03/20 0500 12/04/20 0806  AST 44* 33 31  33  ALT 29 24 18  21   ALKPHOS 61 64 59  60  BILITOT 4.3* 2.5* 1.6*  1.7*  PROT 7.3 6.2* 6.0*  5.8*  ALBUMIN 3.8 3.6 3.2*  3.3*    Recent Labs  Lab 12/02/20 1408 12/03/20 0500  LIPASE 317* 186*    No results for input(s): AMMONIA in the last 168  hours.  ABG    Component Value Date/Time   TCO2 23 08/25/2017 1631      Coagulation Profile: No results for input(s): INR, PROTIME in the last 168 hours.  Cardiac Enzymes: No results for input(s): CKTOTAL, CKMB, CKMBINDEX, TROPONINI in the last 168 hours.  HbA1C: HB A1C (BAYER DCA - WAIVED)  Date/Time Value Ref Range Status  10/25/2020 02:37 PM 5.5 <7.0 % Final    Comment:  Diabetic Adult            <7.0                                       Healthy Adult        4.3 - 5.7                                                           (DCCT/NGSP) American Diabetes Association's Summary of Glycemic Recommendations for Adults with Diabetes: Hemoglobin A1c <7.0%. More stringent glycemic goals (A1c <6.0%) may further reduce complications at the cost of increased risk of hypoglycemia.   07/25/2020 02:44 PM 6.3 <7.0 % Final    Comment:                                          Diabetic Adult            <7.0                                       Healthy Adult        4.3 - 5.7                                                           (DCCT/NGSP) American Diabetes Association's Summary of Glycemic Recommendations for Adults with Diabetes: Hemoglobin A1c <7.0%. More stringent glycemic goals (A1c <6.0%) may further reduce complications at the cost of increased risk of hypoglycemia.    Hgb A1c MFr Bld  Date/Time Value Ref Range Status  12/03/2020 05:00 AM 5.7 (H) 4.8 - 5.6 % Final    Comment:    (NOTE) Pre diabetes:          5.7%-6.4%  Diabetes:              >6.4%  Glycemic control for   <7.0% adults with diabetes     CBG: Recent Labs  Lab 12/04/20 0820 12/04/20 0923 12/04/20 1122 12/04/20 1500 12/04/20 2217  GLUCAP 99 107* 172* 100* 119*     Review of Systems:   na  Past Medical History:  Thomas Sandoval,  has a past medical history of Arthritis, Bladder cancer ([place]), Chronic back pain, COPD (chronic obstructive pulmonary disease) ([place]),  DDD (degenerative disc disease), Diabetic retinopathy of both eyes ([place]), Essential hypertension, GERD (gastroesophageal reflux disease), History of atrial flutter (02/2011), History of chronic bronchitis, History of hemolytic anemia (02/2011), HOH (hard of hearing), HOH (hard of hearing), Mitral valve prolapse, PAD (peripheral artery disease) ([place]) (04/2014), PAF (paroxysmal atrial fibrillation) ([place]), Perforated ulcer ([place]), Productive cough, Smokers' cough ([place]), and Type 1 diabetes mellitus ([place]) (1977).   Surgical History:   Past Surgical History:  Procedure Laterality Date   CYSTOSCOPY WITH URETEROSCOPY Right 08/14/2013   Procedure: CYSTOSCOPY WITH URETEROSCOPY BLADDER BIOPSY ;  Surgeon: Claybon Jabs, MD;  Location: [facility];  Service: Urology;  Laterality: Right;   ESOPHAGOGASTRODUODENOSCOPY N/A 02/06/2013   Procedure: ESOPHAGOGASTRODUODENOSCOPY (EGD);  Surgeon: Lafayette Dragon, MD;  [facility] ENDOSCOPY;  Service: Endoscopy;  Laterality: N/A;   LAPAROSCOPY N/A 11/25/2014   Procedure: LAPAROSCOPIC PRIMARY REPAIR OF PERFORATED PREPYLORIC ULCER WITH Silvestre Gunner;  Surgeon: Greer Pickerel, MD;  Location: [facility];  Service: General;  Laterality: N/A;   TONSILLECTOMY  as child   TRANSTHORACIC ECHOCARDIOGRAM  02-17-2011   MODERATE LVH/  EF 65%   TRANSURETHRAL RESECTION OF BLADDER TUMOR WITH GYRUS (TURBT-GYRUS) N/A 06/12/2013   Procedure: TRANSURETHRAL RESECTION OF BLADDER TUMOR WITH GYRUS (TURBT-GYRUS);  Surgeon: Claybon Jabs, MD;  Location[facility];  Service: Urology;  Laterality: N/A;   TYMPANIC MEMBRANE REPAIR  as child     Social History:   reports that Thomas Sandoval has been smoking cigarettes. Thomas Sandoval has a 60.00 pack-year smoking history. Thomas Sandoval quit smokeless tobacco use about 42 years ago. Thomas Sandoval reports current alcohol use. Thomas Sandoval reports that Thomas Sandoval does not use drugs.   Family History:  His family history includes Breast cancer in his mother; Cancer in his mother; Diabetes in his  son; Heart attack in his father; Heart disease in his father; Rheumatic fever in his father.   Allergies Allergies  Allergen Reactions   Azithromycin Other (See Comments) and Nausea And Vomiting    "Severe stomach cramps; told to list as an allergy by dr. Huel Cote ago"   Avelox [Moxifloxacin Hcl In Nacl] Other (See Comments)    Hemolysis  In 2012   Bactrim [Sulfamethoxazole-Trimethoprim] Diarrhea and Nausea And Vomiting     Home Medications  Prior to Admission medications   Medication Sig Start Date End Date Taking? Authorizing Provider  albuterol (PROVENTIL HFA;VENTOLIN HFA) 108 (90 Base) MCG/ACT inhaler Inhale 2 puffs every 6 (six) hours as needed into the lungs for wheezing or shortness of breath. 05/17/17  Yes Hassell Done, [name], FNP  aspirin 81 MG tablet Take 81 mg by mouth daily.   Yes [provider]  cetirizine (ZYRTEC) 10 MG tablet Take 10 mg by mouth daily as needed for allergies.   Yes [provider]  fluticasone (FLONASE) 50 MCG/ACT nasal spray SPRAY 2 SPRAYS INTO EACH NOSTRIL EVERY DAY Patient taking differently: Place 2 sprays into both nostrils daily as needed for allergies. 11/17/18  Yes Martin, [name], FNP  fluticasone-salmeterol (ADVAIR) 250-50 MCG/ACT AEPB Inhale 1 puff into the lungs in the morning and at bedtime.   Yes [provider]  Fluticasone-Umeclidin-Vilant (TRELEGY ELLIPTA) 100-62.5-25 MCG/INH AEPB Inhale 1 puff into the lungs 2 (two) times daily. 10/25/20  Yes Hassell Done, [name], FNP  HYDROcodone-acetaminophen (NORCO/VICODIN) 5-325 MG tablet Take 1 tablet by mouth in the morning and at bedtime. 11/25/20 12/25/20 Yes Martin, [name], FNP  insulin glargine (LANTUS) 100 UNIT/ML injection INJECT 0.4 MLS (40 UNITS TOTAL) INTO THE SKIN IN THE MORNING. Patient taking differently: Inject 40 Units into the skin daily. INJECT 0.4 MLS (40 UNITS TOTAL) INTO THE SKIN IN THE MORNING. 12/02/20  Yes Hassell Done, [name], FNP  lisinopril  (ZESTRIL) 10 MG tablet Take 1 tablet (10 mg total) by mouth daily. 10/25/20  Yes Martin, [name], FNP  NOVOLOG 100 UNIT/ML injection PER SLIDING SCALE: 190 - 200 = 2 UNITS 300 AND ABOVE = 7 UNITS Patient taking differently: Inject 0-22 Units into the skin 3 (three) times daily with meals. Sliding scale 12/02/20  Yes Hassell Done, [name], FNP  doxycycline (VIBRA-TABS) 100 MG tablet Take 1 tablet (  100 mg total) by mouth 2 (two) times daily. 1 po bid Patient taking differently: Take 100 mg by mouth 2 (two) times daily. 11/18/20   Hassell Done [name], FNP  famotidine (PEPCID) 20 MG tablet Take 1 tablet (20 mg total) by mouth every morning. Reported on 04/16/2015 10/25/20   Chevis Pretty, FNP  HYDROcodone-acetaminophen (NORCO/VICODIN) 5-325 MG tablet Take 1 tablet by mouth in the morning and at bedtime. Patient not taking: Reported on 12/02/2020 12/25/20 01/24/21  Chevis Pretty, FNP  Insulin Syringe-Needle U-100 (B-D INS SYR ULTRAFINE 1CC/30G) 30G X 1/2" 1 ML MISC Use 4 times a day with insulin Dx E11.9 08/22/20   Chevis Pretty, FNP  Insulin Syringes, Disposable, U-100 1 ML MISC Use 4 times a day for insulin injection Dx E11.9 12/27/18   Chevis Pretty, FNP     Critical care time: 35 minutes     JD Rexene Agent [facility] 12/05/2020, 7:22 AM  Please see [email or web address] for pager details.  From 7A-7P if no response, please call [phone]. After hours, please call ELink [phone].

## 2020-12-06 DIAGNOSIS — I5021 Acute systolic (congestive) heart failure: Secondary | ICD-10-CM

## 2020-12-06 DIAGNOSIS — E1165 Type 2 diabetes mellitus with hyperglycemia: Secondary | ICD-10-CM

## 2020-12-06 DIAGNOSIS — J4489 Other specified chronic obstructive pulmonary disease: Secondary | ICD-10-CM

## 2020-12-06 DIAGNOSIS — J449 Chronic obstructive pulmonary disease, unspecified: Secondary | ICD-10-CM

## 2020-12-06 DIAGNOSIS — J439 Emphysema, unspecified: Secondary | ICD-10-CM

## 2020-12-06 LAB — CBC
HCT: 39.2 % (ref 39.0–52.0)
Hemoglobin: 13.5 g/dL (ref 13.0–17.0)
MCH: 36.4 pg — ABNORMAL HIGH (ref 26.0–34.0)
MCHC: 34.4 g/dL (ref 30.0–36.0)
MCV: 105.7 fL — ABNORMAL HIGH (ref 80.0–100.0)
Platelets: 105 10*3/uL — ABNORMAL LOW (ref 150–400)
RBC: 3.71 MIL/uL — ABNORMAL LOW (ref 4.22–5.81)
RDW: 12.2 % (ref 11.5–15.5)
WBC: 7.2 10*3/uL (ref 4.0–10.5)
nRBC: 0 % (ref 0.0–0.2)

## 2020-12-06 LAB — COMPREHENSIVE METABOLIC PANEL
ALT: 18 U/L (ref 0–44)
AST: 23 U/L (ref 15–41)
Albumin: 2.7 g/dL — ABNORMAL LOW (ref 3.5–5.0)
Alkaline Phosphatase: 59 U/L (ref 38–126)
Anion gap: 6 (ref 5–15)
BUN: 10 mg/dL (ref 8–23)
CO2: 26 mmol/L (ref 22–32)
Calcium: 8.6 mg/dL — ABNORMAL LOW (ref 8.9–10.3)
Chloride: 100 mmol/L (ref 98–111)
Creatinine, Ser: 1.01 mg/dL (ref 0.61–1.24)
GFR, Estimated: >60 mL/min (ref >60)
Glucose, Bld: 200 mg/dL — ABNORMAL HIGH (ref 70–99)
Potassium: 3.7 mmol/L (ref 3.5–5.1)
Sodium: 132 mmol/L — ABNORMAL LOW (ref 135–145)
Total Bilirubin: 1.4 mg/dL — ABNORMAL HIGH (ref 0.3–1.2)
Total Protein: 5.5 g/dL — ABNORMAL LOW (ref 6.5–8.1)

## 2020-12-06 LAB — MAGNESIUM: Magnesium: 1.9 mg/dL (ref 1.7–2.4)

## 2020-12-06 LAB — GLUCOSE, CAPILLARY
Glucose-Capillary: 199 mg/dL — ABNORMAL HIGH (ref 70–99)
Glucose-Capillary: 251 mg/dL — ABNORMAL HIGH (ref 70–99)

## 2020-12-06 MED ORDER — CHLORDIAZEPOXIDE HCL 25 MG PO CAPS: 25.0000 mg | ORAL_CAPSULE | Freq: Four times a day (QID) | ORAL | Status: DC

## 2020-12-06 MED ORDER — MAGNESIUM SULFATE 2 GM/50ML IV SOLN
2.0000 g | Freq: Once | INTRAVENOUS | Status: AC
  Administered 2020-12-06: 2 g via INTRAVENOUS
  Filled 2020-12-06: qty 50

## 2020-12-06 MED ORDER — LORAZEPAM 2 MG/ML IJ SOLN: 1.0000 mg | INTRAMUSCULAR | Status: DC | PRN

## 2020-12-06 MED ORDER — CHLORDIAZEPOXIDE HCL 25 MG PO CAPS: 25.0000 mg | ORAL_CAPSULE | Freq: Four times a day (QID) | ORAL | Status: DC | PRN

## 2020-12-06 MED ORDER — CHLORDIAZEPOXIDE HCL 25 MG PO CAPS: 25.0000 mg | ORAL_CAPSULE | ORAL | Status: DC

## 2020-12-06 MED ORDER — CHLORDIAZEPOXIDE HCL 25 MG PO CAPS: 25.0000 mg | ORAL_CAPSULE | Freq: Three times a day (TID) | ORAL | Status: DC

## 2020-12-06 MED ORDER — ONDANSETRON 4 MG PO TBDP: 4.0000 mg | ORAL_TABLET | Freq: Four times a day (QID) | ORAL | Status: DC | PRN

## 2020-12-06 MED ORDER — CHLORDIAZEPOXIDE HCL 25 MG PO CAPS: 25.0000 mg | ORAL_CAPSULE | Freq: Every day | ORAL | Status: DC

## 2020-12-06 MED ORDER — LOPERAMIDE HCL 2 MG PO CAPS: 2.0000 mg | ORAL_CAPSULE | ORAL | Status: DC | PRN

## 2020-12-06 MED ORDER — CHLORDIAZEPOXIDE HCL 25 MG PO CAPS
25.0000 mg | ORAL_CAPSULE | Freq: Once | ORAL | Status: DC
  Filled 2020-12-06: qty 1

## 2020-12-06 MED ORDER — POTASSIUM CHLORIDE CRYS ER 20 MEQ PO TBCR
40.0000 meq | EXTENDED_RELEASE_TABLET | Freq: Once | ORAL | Status: AC
  Administered 2020-12-06: 40 meq via ORAL
  Filled 2020-12-06: qty 2

## 2020-12-06 MED ORDER — INSULIN GLARGINE-YFGN 100 UNIT/ML ~~LOC~~ SOLN
15.0000 [IU] | Freq: Every day | SUBCUTANEOUS | Status: DC
  Administered 2020-12-06: 15 [IU] via SUBCUTANEOUS
  Filled 2020-12-06: qty 0.15

## 2020-12-06 MED ORDER — OXYCODONE HCL 5 MG PO TABS
5.0000 mg | ORAL_TABLET | ORAL | Status: DC | PRN
  Administered 2020-12-06: 5 mg via ORAL
  Filled 2020-12-06: qty 1

## 2020-12-06 MED ORDER — HYDROXYZINE HCL 25 MG PO TABS: 25.0000 mg | ORAL_TABLET | Freq: Four times a day (QID) | ORAL | Status: DC | PRN

## 2020-12-06 MED ORDER — INSULIN ASPART 100 UNIT/ML IJ SOLN
0.0000 [IU] | INTRAMUSCULAR | Status: DC
  Administered 2020-12-06: 8 [IU] via SUBCUTANEOUS
  Administered 2020-12-06: 3 [IU] via SUBCUTANEOUS

## 2020-12-06 MED ORDER — ALBUTEROL SULFATE (2.5 MG/3ML) 0.083% IN NEBU: 2.5000 mg | INHALATION_SOLUTION | Freq: Three times a day (TID) | RESPIRATORY_TRACT | Status: DC

## 2020-12-06 NOTE — Progress Notes (Signed)
NAME:  Thomas Sandoval., MRN:  [id number], DOB:  February 27, 1956, LOS: 4 ADMISSION DATE:  12/02/2020, CONSULTATION DATE: 12/05/2018 REFERRING MD: [facility], CHIEF COMPLAINT: Alcohol  History of Present Illness:  Mr. Zion is a 65 year old male who appears older than stated age and was admitted with acute pancreatitis noted to be consuming approximately 5 quarts of liquor per week and developed alcohol withdrawal on 12/04/2020 and was extremely combative and difficult to control.  He has an extensive past medical history is well-documented below.  Most notably has history of gastric ulcer which required surgical intervention.  Is also noted to have a new onset of atrial febrile ventricular response which is being controlled with beta-blockers.  Due to his excessive agitation with physical manifestations he is transferred to the intensive care unit and started on Precedex drip with obvious improvement within 30 minutes.  He will be continued on his current interventions until improved.  Pertinent  Medical History   Past Medical History:  Diagnosis Date   Arthritis    Bladder cancer ([place])    Chronic back pain    COPD (chronic obstructive pulmonary disease) (HCC)    DDD (degenerative disc disease)    Diabetic retinopathy of both eyes (HCC)    Essential hypertension    GERD (gastroesophageal reflux disease)    History of atrial flutter 02/2011   Converted to NSR with Cardizem   History of chronic bronchitis    History of hemolytic anemia 02/2011   secondary to Avelox   HOH (hard of hearing)    HOH (hard of hearing)    no eardrum and nerve damage on R, also HOH on L   Mitral valve prolapse    a. 2D Echo 11/27/14: EF 55-60%; images were inadequate for LV wall motion assessment, + mild late systolic mitral valve prolapse involving the anterior leaflet.   PAD (peripheral artery disease) ([place]) 04/2014   Dr Trula Slade; bilateral SFA occlusion, R mid, L distal   PAF (paroxysmal atrial fibrillation)  ([place])    a. Dx 11/2014 during admission for perf ulcer.   Perforated ulcer ([place])    a. 11/2014 s/p surgery.   Productive cough    Smokers' cough ([place])    Type 1 diabetes mellitus ([place]) [facility] Events: Including procedures, antibiotic start and stop dates in addition to other pertinent events   12/04/2020 transferred to ICU for severe alcohol withdrawal; started on precedex 9/30: weaned off precedex; CIWA protocol in place  Interim History / Subjective:  Patient off precedex. More alert and oriented today. Only complaining of chronic back pain that he takes hydrocodone for.  No complaints of abdominal pain Afib with rate in 70s. Receiving K this am On 1 L [place]   Objective   Blood pressure 125/74, pulse 82, temperature 98.6 F (37 C), temperature source Axillary, resp. rate (!) 22, height 5\' 11"  (1.803 m), weight 77.7 kg, SpO2 99 %.        Intake/Output Summary (Last 24 hours) at 12/06/2020 0715 Last data filed at 12/05/2020 1900 Gross per 24 hour  Intake 1286.01 ml  Output 750 ml  Net 536.01 ml    Filed Weights   12/03/20 1228 12/03/20 2043  Weight: 77.7 kg 77.7 kg    Examination: General: NAD on [place] HEENT: MM pink/moist; [place] in place Neuro: Aox3; MAE CV: s1s2, afib rate in 70s, no m/r/g PULM:  dim wheezing in bases BS bilaterally; [place] 1 L GI: soft, bsx4 active;  Extremities: warm/dry, no edema  Skin: no rashes or lesions   Labs Na 132, K 3.7, Glucose range 168-312, Mag 1.9, Bili 1.4 (1.7)   Resolved Hospital Problem list    Hyponatremia  Assessment & Plan:  Altered mental status improving; in the setting of daily alcohol consumption noted to be 5 quarts of liquor per week.   Agitation Delirium w/ suspected hallucinations:transferred to the ICU 12/04/2020 for Precedex. P: -off precedex this am -continue with CIWA protocol -continue to monitor for any signs of seizures -continue thiamine/folate  Severe pancreatitis: likely alcohol  related P: -trend CMP -prn analgesics for pain -will need education before discharge to stop drinking  Pulmonary congestion  P: -Trend chest x-ray -Pulm toilet: IS and flutter -PT/OT  Atrial fibrillation with rapid ventricular response Hx of HTN P: -rate in the 70s -continue BB  Gastroesophageal reflux disease, history of perforated ulcer P: -PPI Q12  Diabetes mellitus type 1 P: -Glucose range 168-312 -increased SSI to moderate and continue CBG monitoring -semglee increased to 15 units daily  Hx of COPD:  trelegy ellipta and prn albuterol at home Hx of smoking: 60 PYH P: -will start Breo/Incruse -scheduled abuterol nebs for wheezing -nicotine patch  Chronic back pain P: -consider restarting home hydrocodone   Best Practice (right click and "Reselect all SmartList Selections" daily)   Diet/type: NPO DVT prophylaxis: LMWH GI prophylaxis: PPI Lines: N/A Foley:  N/A Code Status:  full code Last date of multidisciplinary goals of care discussion [9/30: spoke with ]  Labs   CBC: Recent Labs  Lab 12/02/20 1408 12/03/20 0500 12/05/20 0353 12/06/20 0151  WBC 8.2 10.2 6.7 7.2  NEUTROABS 7.5  --  5.1  --   HGB 16.5 15.5 14.3 13.5  HCT 46.4 45.9 40.6 39.2  MCV 102.9* 108.5* 104.9* 105.7*  PLT 186 135* 99* 105*     Basic Metabolic Panel: Recent Labs  Lab 12/03/20 0500 12/04/20 0806 12/05/20 0353 12/05/20 1350 12/06/20 0151  NA 130* 138 136 134* 132*  K 4.2 3.5 3.4* 3.9 3.7  CL 95* 102 102 99 100  CO2 21* 27 24 27 26   GLUCOSE 187* 93 108* 296* 200*  BUN 15 8 9 10 10   CREATININE 1.17 0.92 0.89 1.09 1.01  CALCIUM 9.3 8.8* 8.7* 8.7* 8.6*  MG 1.3*  --  1.7  --  1.9  PHOS 3.1  --  3.8  --   --     GFR: Estimated Creatinine Clearance: 79.7 mL/min (by C-G formula based on SCr of 1.01 mg/dL). Recent Labs  Lab 12/02/20 1408 12/02/20 1540 12/03/20 0500 12/05/20 0353 12/06/20 0151  WBC 8.2  --  10.2 6.7 7.2  LATICACIDVEN  --  1.9  --   --   --       Liver Function Tests: Recent Labs  Lab 12/02/20 1408 12/03/20 0500 12/04/20 0806 12/05/20 1350 12/06/20 0151  AST 44* 33 31  33 27 23  ALT 29 24 18  21 19 18   ALKPHOS 61 64 59  60 62 59  BILITOT 4.3* 2.5* 1.6*  1.7* 1.7* 1.4*  PROT 7.3 6.2* 6.0*  5.8* 5.6* 5.5*  ALBUMIN 3.8 3.6 3.2*  3.3* 2.8* 2.7*    Recent Labs  Lab 12/02/20 1408 12/03/20 0500  LIPASE 317* 186*    No results for input(s): AMMONIA in the last 168 hours.  ABG    Component Value Date/Time   TCO2 23 08/25/2017 1631      Coagulation Profile: No  results for input(s): INR, PROTIME in the last 168 hours.  Cardiac Enzymes: No results for input(s): CKTOTAL, CKMB, CKMBINDEX, TROPONINI in the last 168 hours.  HbA1C: HB A1C (BAYER DCA - WAIVED)  Date/Time Value Ref Range Status  10/25/2020 02:37 PM 5.5 <7.0 % Final    Comment:                                          Diabetic Adult            <7.0                                       Healthy Adult        4.3 - 5.7                                                           (DCCT/NGSP) American Diabetes Association's Summary of Glycemic Recommendations for Adults with Diabetes: Hemoglobin A1c <7.0%. More stringent glycemic goals (A1c <6.0%) may further reduce complications at the cost of increased risk of hypoglycemia.   07/25/2020 02:44 PM 6.3 <7.0 % Final    Comment:                                          Diabetic Adult            <7.0                                       Healthy Adult        4.3 - 5.7                                                           (DCCT/NGSP) American Diabetes Association's Summary of Glycemic Recommendations for Adults with Diabetes: Hemoglobin A1c <7.0%. More stringent glycemic goals (A1c <6.0%) may further reduce complications at the cost of increased risk of hypoglycemia.    Hgb A1c MFr Bld  Date/Time Value Ref Range Status  12/03/2020 05:00 AM 5.7 (H) 4.8 - 5.6 % Final    Comment:    (NOTE) Pre  diabetes:          5.7%-6.4%  Diabetes:              >6.4%  Glycemic control for   <7.0% adults with diabetes     CBG: Recent Labs  Lab 12/04/20 2217 12/05/20 0751 12/05/20 1108 12/05/20 1651 12/05/20 2059  GLUCAP 119* 73 168* 312* 309*     Review of Systems:   na  Past Medical History:  He,  has a past medical history of Arthritis, Bladder cancer ([place]), Chronic back pain, COPD (chronic obstructive pulmonary disease) ([place]), DDD (degenerative disc disease), Diabetic retinopathy of both eyes ([place]),  Essential hypertension, GERD (gastroesophageal reflux disease), History of atrial flutter (02/2011), History of chronic bronchitis, History of hemolytic anemia (02/2011), HOH (hard of hearing), HOH (hard of hearing), Mitral valve prolapse, PAD (peripheral artery disease) ([place]) (04/2014), PAF (paroxysmal atrial fibrillation) ([place]), Perforated ulcer ([place]), Productive cough, Smokers' cough ([place]), and Type 1 diabetes mellitus ([place]) (1977).   Surgical History:   Past Surgical History:  Procedure Laterality Date   CYSTOSCOPY WITH URETEROSCOPY Right 08/14/2013   Procedure: CYSTOSCOPY WITH URETEROSCOPY BLADDER BIOPSY ;  Surgeon: Claybon Jabs, MD;  Location: [facility];  Service: Urology;  Laterality: Right;   ESOPHAGOGASTRODUODENOSCOPY N/A 02/06/2013   Procedure: ESOPHAGOGASTRODUODENOSCOPY (EGD);  Surgeon: Lafayette Dragon, MD;  Location: [facility]COPY;  Service: Endoscopy;  Laterality: N/A;   LAPAROSCOPY N/A 11/25/2014   Procedure: LAPAROSCOPIC PRIMARY REPAIR OF PERFORATED PREPYLORIC ULCER WITH Silvestre Gunner;  Surgeon: Greer Pickerel, MD;  Location: [facility];  Service: General;  Laterality: N/A;   TONSILLECTOMY  as child   TRANSTHORACIC ECHOCARDIOGRAM  02-17-2011   MODERATE LVH/  EF 65%   TRANSURETHRAL RESECTION OF BLADDER TUMOR WITH GYRUS (TURBT-GYRUS) N/A 06/12/2013   Procedure: TRANSURETHRAL RESECTION OF BLADDER TUMOR WITH GYRUS (TURBT-GYRUS);  Surgeon: Claybon Jabs, MD;  [facility]  [facility]  Service: Urology;  Laterality: N/A;   TYMPANIC MEMBRANE REPAIR  as child     Social History:   reports that he has been smoking cigarettes. He has a 60.00 pack-year smoking history. He quit smokeless tobacco use about 42 years ago. He reports current alcohol use. He reports that he does not use drugs.   Family History:  His family history includes Breast cancer in his mother; Cancer in his mother; Diabetes in his son; Heart attack in his father; Heart disease in his father; Rheumatic fever in his father.   Allergies Allergies  Allergen Reactions   Azithromycin Other (See Comments) and Nausea And Vomiting    "Severe stomach cramps; told to list as an allergy by dr. Huel Cote ago"   Avelox [Moxifloxacin Hcl In Nacl] Other (See Comments)    Hemolysis  In 2012   Bactrim [Sulfamethoxazole-Trimethoprim] Diarrhea and Nausea And Vomiting     Home Medications  Prior to Admission medications   Medication Sig Start Date End Date Taking? Authorizing Provider  albuterol (PROVENTIL HFA;VENTOLIN HFA) 108 (90 Base) MCG/ACT inhaler Inhale 2 puffs every 6 (six) hours as needed into the lungs for wheezing or shortness of breath. 05/17/17  Yes Hassell Done, [name], FNP  aspirin 81 MG tablet Take 81 mg by mouth daily.   Yes [provider]  cetirizine (ZYRTEC) 10 MG tablet Take 10 mg by mouth daily as needed for allergies.   Yes [provider]  fluticasone (FLONASE) 50 MCG/ACT nasal spray SPRAY 2 SPRAYS INTO EACH NOSTRIL EVERY DAY Patient taking differently: Place 2 sprays into both nostrils daily as needed for allergies. 11/17/18  Yes Martin, [name], FNP  fluticasone-salmeterol (ADVAIR) 250-50 MCG/ACT AEPB Inhale 1 puff into the lungs in the morning and at bedtime.   Yes [provider]  Fluticasone-Umeclidin-Vilant (TRELEGY ELLIPTA) 100-62.5-25 MCG/INH AEPB Inhale 1 puff into the lungs 2 (two) times daily. 10/25/20  Yes Hassell Done, [name], FNP   HYDROcodone-acetaminophen (NORCO/VICODIN) 5-325 MG tablet Take 1 tablet by mouth in the morning and at bedtime. 11/25/20 12/25/20 Yes Martin, [name], FNP  insulin glargine (LANTUS) 100 UNIT/ML injection INJECT 0.4 MLS (40 UNITS TOTAL) INTO THE SKIN IN THE MORNING. Patient taking differently: Inject 40 Units into the  skin daily. INJECT 0.4 MLS (40 UNITS TOTAL) INTO THE SKIN IN THE MORNING. 12/02/20  Yes Hassell Done, [name], FNP  lisinopril (ZESTRIL) 10 MG tablet Take 1 tablet (10 mg total) by mouth daily. 10/25/20  Yes Martin, [name], FNP  NOVOLOG 100 UNIT/ML injection PER SLIDING SCALE: 190 - 200 = 2 UNITS 300 AND ABOVE = 7 UNITS Patient taking differently: Inject 0-22 Units into the skin 3 (three) times daily with meals. Sliding scale 12/02/20  Yes Hassell Done, [name], FNP  doxycycline (VIBRA-TABS) 100 MG tablet Take 1 tablet (100 mg total) by mouth 2 (two) times daily. 1 po bid Patient taking differently: Take 100 mg by mouth 2 (two) times daily. 11/18/20   Hassell Done [name], FNP  famotidine (PEPCID) 20 MG tablet Take 1 tablet (20 mg total) by mouth every morning. Reported on 04/16/2015 10/25/20   Chevis Pretty, FNP  HYDROcodone-acetaminophen (NORCO/VICODIN) 5-325 MG tablet Take 1 tablet by mouth in the morning and at bedtime. Patient not taking: Reported on 12/02/2020 12/25/20 01/24/21  Chevis Pretty, FNP  Insulin Syringe-Needle U-100 (B-D INS SYR ULTRAFINE 1CC/30G) 30G X 1/2" 1 ML MISC Use 4 times a day with insulin Dx E11.9 08/22/20   Chevis Pretty, FNP  Insulin Syringes, Disposable, U-100 1 ML MISC Use 4 times a day for insulin injection Dx E11.9 12/27/18   Chevis Pretty, FNP     Critical care time: 35 minutes     JD Rexene Agent [facility] 12/06/2020, 7:15 AM  Please see [email or web address] for pager details.  From 7A-7P if no response, please call [phone]. After hours, please call ELink [phone].

## 2020-12-06 NOTE — Progress Notes (Addendum)
Date: 12/06/2020 Patient: Thomas Sandoval. Admitted: 12/02/2020  1:54 PM Attending Provider: Chesley Mires, MD  [name] or his authorized caregiver has made the decision for the patient to leave the hospital against the advice of Chesley Mires, MD.  He or his authorized caregiver has been informed and understands the inherent risks, including death.  He or his authorized caregiver has decided to accept the responsibility for this decision. [name] and all necessary parties have been advised that he may return for further evaluation or treatment. His condition at time of discharge was Stable.  [name] had current vital signs as follows:  Blood pressure 129/89, pulse 85, temperature (!) 97.1 F (36.2 C), temperature source Oral, resp. rate (!) 26, height 5\' 11"  (1.803 m), weight 77.7 kg, SpO2 96 %.   [name] or his authorized caregiver has signed the Leaving Against Medical Advice form prior to leaving the department.  Jakim Drapeau R Lillieann Pavlich, RN  12/06/2020

## 2020-12-06 NOTE — Discharge Summary (Signed)
Discharge Summary  Name: Thomas Sandoval. DOB: [birth year] MRN: [id number] Admit date: 12/02/2020 Discharge date: 12/06/2020  History: 65 yr old male smoker with history of alcohol abuse presented to the ER with nausea, vomiting and abdominal pain.  He was found to have elevated lipase and CT imaging consistent with acute pancreatitis.  He also had a fib with RVR in the ER.  He was started on cardizem infusion and IV fluids.  He was admitted to telemetry.  In the evening of 12/04/20 he developed progressive agitation and confusion.  Started on CIWA protocol, but continued to get worse.  Transferred to ICU and started on precedex.  This was transitioned off on 12/05/20.  Echo showed low ejection fraction.  On 12/06/20 he was alert and oriented x 3, and following commands appropriately.  He decided to leave hospital against medical advice.  He signed appropriate forms and left hospital.  No prescriptions provided.  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

## 2020-12-06 NOTE — Progress Notes (Signed)
[facility] ADULT ICU REPLACEMENT PROTOCOL   The patient does apply for the [facility] Adult ICU Electrolyte Replacment Protocol based on the criteria listed below:   1.Exclusion criteria: TCTS patients, ECMO patients and Hypothermia Protocol, and   Dialysis patients 2. Is GFR >/= 30 ml/min? Yes.    Patient's GFR today is >60 3. Is SCr </= 2? Yes.   Patient's SCr is 1.01 mg/dL 4. Did SCr increase >/= 0.5 in 24 hours? No. 5.Pt's weight >40kg  Yes.   6. Abnormal electrolyte(s):  K 3.7, Mg 1.9  7. Electrolytes replaced per protocol 8.  Call MD STAT for K+ </= 2.5, Phos </= 1, or Mag </= 1 Physician:  S. Rosie Fate R Ena Demary 12/06/2020 5:56 AM

## 2020-12-06 NOTE — TOC Initial Note (Signed)
Transition of Care (TOC) - Initial/Assessment Note    Patient Details  Name: Thomas Sandoval. MRN: [id number] Date of Birth: [birth year]  Transition of Care [facility]) CM/SW Contact:    Tom-Johnson, Renea Ee, RN Phone Number: 12/06/2020, 4:29 PM  Clinical Narrative:                 CM spoke with patient and wife, Thomas Sandoval at bedside. Patient is hard of hearing and Thomas Sandoval answered most of the questions. States both live together and have three children. Worked at [employer] all their lives. Ambulates with a walker and independent with his care. Endorse drinking alcohol and smoking cigarettes and declined any education to quit. Has a PCP and Medical Insurance. Declines home health services recommended by PT/OT and denies any needs. CM will continue to follow with needs.    Barriers to Discharge: Continued Medical Work up   Patient Goals and CMS Choice Patient states their goals for this hospitalization and ongoing recovery are:: To go home      Expected Discharge Plan and Services     Discharge Planning Services: CM Consult   Living arrangements for the past 2 months: Single Family Home                                      Prior Living Arrangements/Services Living arrangements for the past 2 months: Single Family Home Lives with:: Spouse Patient language and need for interpreter reviewed:: Yes Do you feel safe going back to the place where you live?: Yes      Need for Family Participation in Patient Care: Yes (Comment)   Current home services: DME [facility]) Criminal Activity/Legal Involvement Pertinent to Current Situation/Hospitalization: No - Comment as needed  Activities of Daily Living Home Assistive Devices/Equipment: Shower chair with back ADL Screening (condition at time of admission) Patient's cognitive ability adequate to safely complete daily activities?: Yes Is the patient deaf or have difficulty hearing?: Yes Does the patient have difficulty  seeing, even when wearing glasses/contacts?: No Does the patient have difficulty concentrating, remembering, or making decisions?: No Patient able to express need for assistance with ADLs?: Yes Does the patient have difficulty dressing or bathing?: No Independently performs ADLs?: Yes (appropriate for developmental age) Does the patient have difficulty walking or climbing stairs?: Yes Weakness of Legs: Both Weakness of Arms/Hands: None  Permission Sought/Granted Permission sought to share information with : Case Manager, Family Supports Permission granted to share information with : Yes, Verbal Permission Granted              Emotional Assessment Appearance:: Appears stated age Attitude/Demeanor/Rapport: Engaged Affect (typically observed): Accepting, Appropriate, Hopeful Orientation: : Oriented to Self, Oriented to Place, Oriented to  Time, Oriented to Situation Alcohol / Substance Use: Tobacco Use, Alcohol Use Psych Involvement: No (comment)  Admission diagnosis:  Acute pancreatitis [K85.90] Alcoholism ([place]) [F10.20] Generalized abdominal pain [R10.84] Atrial flutter, unspecified type ([place]) [I48.92] Alcohol-induced acute pancreatitis, unspecified complication status [T03.54] Patient Active Problem List   Diagnosis Date Noted   COPD with chronic bronchitis and emphysema ([place])    Hyperglycemia due to diabetes mellitus ([place])    Generalized abdominal pain    Alcoholism ([place])    Acute pancreatitis 12/02/2020   Acute pharyngitis 06/10/2020   Hearing loss 07/26/2019   Back pain 05/02/2019   Mild renal insufficiency 08/25/2017   Hyperkalemia 08/25/2017   Prepyloric ulcer 12/12/2014  Essential hypertension 12/07/2014   Mitral valve prolapse 12/06/2014   Atrial fibrillation with rapid ventricular response ([place]) 11/26/2014   GERD (gastroesophageal reflux disease) 02/06/2013   DM type 2 (diabetes mellitus, type 2) ([place]) 02/04/2013   COPD (chronic obstructive pulmonary disease)  ([place]) 02/20/2011   Atrial flutter/fib 02/16/2011   PCP:  Chevis Pretty, FNP Pharmacy:   [id number] [place], [address] [place] [address] Phone: [phone] Fax: [phone]     Social Determinants of Health (SDOH) Interventions    Readmission Risk Interventions No flowsheet data found.

## 2020-12-10 ENCOUNTER — Telehealth: Payer: Self-pay

## 2020-12-10 NOTE — Telephone Encounter (Signed)
Transition Care Management Follow-up Telephone Call Date of discharge and from where: 12/06/2020 / [facility]   How have you been since you were released from the hospital? " Ok" Any questions or concerns? No  Items Reviewed: Did the pt receive and understand the discharge instructions provided?  "Patient states he doesn't remember" Medications obtained and verified? No  Other? No  Any new allergies since your discharge? No  Dietary orders reviewed? Yes Do you have support at home? Yes   Home Care and Equipment/Supplies: Were home health services ordered? no If so, what is the name of the agency? N/A  Has the agency set up a time to come to the patient's home? not applicable Were any new equipment or medical supplies ordered?  No What is the name of the medical supply agency? N/A Were you able to get the supplies/equipment? not applicable Do you have any questions related to the use of the equipment or supplies? No  Functional Questionnaire: (I = Independent and D = Dependent) ADLs: I  Bathing/Dressing- I  Meal Prep- I  Eating- I  Maintaining continence- II  Transferring/Ambulation- I  Managing Meds- I  Follow up appointments reviewed:  PCP Hospital f/u appt confirmed? No  Scheduled to see  Specialist Hospital f/u appt confirmed? No  Scheduled to see  Are transportation arrangements needed? Yes  If their condition worsens, is the pt aware to call PCP or go to the Emergency Dept.? Yes Was the patient provided with contact information for the PCP's office or ED? Yes Was to pt encouraged to call back with questions or concerns? Yes

## 2020-12-17 ENCOUNTER — Other Ambulatory Visit: Payer: Self-pay

## 2020-12-17 ENCOUNTER — Ambulatory Visit (INDEPENDENT_AMBULATORY_CARE_PROVIDER_SITE_OTHER): Payer: 59 | Admitting: Family Medicine

## 2020-12-17 ENCOUNTER — Encounter: Payer: Self-pay | Admitting: Family Medicine

## 2020-12-17 ENCOUNTER — Ambulatory Visit (INDEPENDENT_AMBULATORY_CARE_PROVIDER_SITE_OTHER): Payer: 59

## 2020-12-17 VITALS — BP 161/92 | HR 128 | Temp 97.8°F | Wt 179.0 lb

## 2020-12-17 DIAGNOSIS — R0602 Shortness of breath: Secondary | ICD-10-CM

## 2020-12-17 DIAGNOSIS — I5021 Acute systolic (congestive) heart failure: Secondary | ICD-10-CM | POA: Diagnosis not present

## 2020-12-17 DIAGNOSIS — R000 Tachycardia, unspecified: Secondary | ICD-10-CM | POA: Diagnosis not present

## 2020-12-17 DIAGNOSIS — Z09 Encounter for follow-up examination after completed treatment for conditions other than malignant neoplasm: Secondary | ICD-10-CM

## 2020-12-17 DIAGNOSIS — Z794 Long term (current) use of insulin: Secondary | ICD-10-CM

## 2020-12-17 DIAGNOSIS — K860 Alcohol-induced chronic pancreatitis: Secondary | ICD-10-CM

## 2020-12-17 DIAGNOSIS — I10 Essential (primary) hypertension: Secondary | ICD-10-CM | POA: Diagnosis not present

## 2020-12-17 DIAGNOSIS — J411 Mucopurulent chronic bronchitis: Secondary | ICD-10-CM

## 2020-12-17 DIAGNOSIS — I4891 Unspecified atrial fibrillation: Secondary | ICD-10-CM | POA: Diagnosis not present

## 2020-12-17 DIAGNOSIS — E119 Type 2 diabetes mellitus without complications: Secondary | ICD-10-CM

## 2020-12-17 DIAGNOSIS — E871 Hypo-osmolality and hyponatremia: Secondary | ICD-10-CM

## 2020-12-17 DIAGNOSIS — F10931 Alcohol use, unspecified with withdrawal delirium: Secondary | ICD-10-CM | POA: Insufficient documentation

## 2020-12-17 MED ORDER — DILTIAZEM HCL ER COATED BEADS 120 MG PO CP24: 120.0000 mg | ORAL_CAPSULE | Freq: Every day | ORAL | 1 refills | Status: DC

## 2020-12-17 MED ORDER — RIVAROXABAN 20 MG PO TABS: 20.0000 mg | ORAL_TABLET | Freq: Every day | ORAL | 1 refills | Status: DC

## 2020-12-17 MED ORDER — FUROSEMIDE 20 MG PO TABS: 20.0000 mg | ORAL_TABLET | Freq: Every day | ORAL | 3 refills | Status: DC

## 2020-12-17 NOTE — Progress Notes (Signed)
Subjective:  Patient ID: Thomas Sandoval., male    DOB: 11-11-1955, 65 y.o.   MRN: [id number]  Patient Care Team: Chevis Pretty, FNP as PCP - General (Family Medicine)   Chief Complaint:  Transitions Of Care   HPI: Lennyn Bellanca. is a 65 y.o. male presenting on 12/17/2020 for Transitions Of Care  Pt presents today for follow up after recent hospital admission for acute alcoholic pancreatitis, A-Fib with RVR, delirium tremens, duodenitis, acute systolic heart failure, cardiomyopathy, elevated LFTs, thrombocytopenia, hyponatremia, T2DM, and COPD. He went to the ED on 12/02/2020 for severe abdominal pain. He was noted to be in A-Fib RVR upon arrival to ED and was started on IV cardizem. Labs were concerning for pancreatitis which was confirmed with CT scan. He admitted to drinking at least 5 quarts of liquor a week for several years. During admission pt became extremely agitated and combative, started having delirium tremens. CIWA protocol was initiated but pt did not respond and had to be moved to ICU and started on Precedex. CXR revealed pleural fluid, echo revealed LVEF 30-35% with dilated left atrium and MVR. After 1 day of Precedex pt became alert and oriented, refused to stay in the hospital any longer and signed self out AMA. He was not sent home on any new medications. Anticoagulation was not started at pts request due to history of perforated ulcer in 2016.  Last labs 12/06/2020 prior to leaving AMA: A1C 5.7, glucose 251, sodium 132, total protein 5.5, total bili 1.4, albumin 2.7, calcium 8.6, RBC 3.71, platelets 105. Lipase 186.  He reports he has been doing ok since being home. Continues to have fatigue, shortness of breath with exertion, and general weakness. States he has not had alcohol since he left the hospital.   Relevant past medical, surgical, family, and social history reviewed and updated as indicated.  Allergies and medications reviewed and updated. Data  reviewed: Chart in Epic.   Past Medical History:  Diagnosis Date   Arthritis    Bladder cancer ([place])    Chronic back pain    COPD (chronic obstructive pulmonary disease) (HCC)    DDD (degenerative disc disease)    Diabetic retinopathy of both eyes (HCC)    Essential hypertension    GERD (gastroesophageal reflux disease)    History of atrial flutter 02/2011   Converted to NSR with Cardizem   History of chronic bronchitis    History of hemolytic anemia 02/2011   secondary to Avelox   HOH (hard of hearing)    HOH (hard of hearing)    no eardrum and nerve damage on R, also HOH on L   Mitral valve prolapse    a. 2D Echo 11/27/14: EF 55-60%; images were inadequate for LV wall motion assessment, + mild late systolic mitral valve prolapse involving the anterior leaflet.   PAD (peripheral artery disease) ([place]) 04/2014   Dr Trula Slade; bilateral SFA occlusion, R mid, L distal   PAF (paroxysmal atrial fibrillation) ([place])    a. Dx 11/2014 during admission for perf ulcer.   Perforated ulcer ([place])    a. 11/2014 s/p surgery.   Productive cough    Smokers' cough ([place])    Type 1 diabetes mellitus ([place]) 1977    Past Surgical History:  Procedure Laterality Date   CYSTOSCOPY WITH URETEROSCOPY Right 08/14/2013   Procedure: CYSTOSCOPY WITH URETEROSCOPY BLADDER BIOPSY ;  Surgeon: Claybon Jabs, MD;  [facility];  Service: Urology;  Laterality: Right;   ESOPHAGOGASTRODUODENOSCOPY N/A 02/06/2013   Procedure: ESOPHAGOGASTRODUODENOSCOPY (EGD);  Surgeon: Lafayette Dragon, MD;  Location: [facility] ENDOSCOPY;  Service: Endoscopy;  Laterality: N/A;   LAPAROSCOPY N/A 11/25/2014   Procedure: LAPAROSCOPIC PRIMARY REPAIR OF PERFORATED PREPYLORIC ULCER WITH Silvestre Gunner;  Surgeon: Greer Pickerel, MD;  Location: [place];  Service: General;  Laterality: N/A;   TONSILLECTOMY  as child   TRANSTHORACIC ECHOCARDIOGRAM  02-17-2011   MODERATE LVH/  EF 65%   TRANSURETHRAL RESECTION OF BLADDER TUMOR WITH GYRUS  (TURBT-GYRUS) N/A 06/12/2013   Procedure: TRANSURETHRAL RESECTION OF BLADDER TUMOR WITH GYRUS (TURBT-GYRUS);  Surgeon: Claybon Jabs, MD;  Location: [facility];  Service: Urology;  Laterality: N/A;   TYMPANIC MEMBRANE REPAIR  as child    Social History   Socioeconomic History   Marital status: Married    Spouse name: Not on file   Number of children: Not on file   Years of education: Not on file   Highest education level: Not on file  Occupational History   Occupation: Tobacco Farmer  Tobacco Use   Smoking status: Every Day    Packs/day: 2.00    Years: 30.00    Pack years: 60.00    Types: Cigarettes   Smokeless tobacco: Former    Quit date: 06/08/1978  Vaping Use   Vaping Use: Never used  Substance and Sexual Activity   Alcohol use: Yes    Comment: 5 quarts per week   Drug use: No   Sexual activity: Not on file  Other Topics Concern   Not on file  Social History Narrative   Lives in [place] with wife and 2 sons.    Social Determinants of Health   Financial Resource Strain: Not on file  Food Insecurity: Not on file  Transportation Needs: Not on file  Physical Activity: Not on file  Stress: Not on file  Social Connections: Not on file  Intimate Partner Violence: Not on file    Outpatient Encounter Medications as of 12/17/2020  Medication Sig   albuterol (PROVENTIL HFA;VENTOLIN HFA) 108 (90 Base) MCG/ACT inhaler Inhale 2 puffs every 6 (six) hours as needed into the lungs for wheezing or shortness of breath.   aspirin 81 MG tablet Take 81 mg by mouth daily.   cetirizine (ZYRTEC) 10 MG tablet Take 10 mg by mouth daily as needed for allergies.   diltiazem (CARDIZEM CD) 120 MG 24 hr capsule Take 1 capsule (120 mg total) by mouth daily.   doxycycline (VIBRA-TABS) 100 MG tablet Take 1 tablet (100 mg total) by mouth 2 (two) times daily. 1 po bid (Patient taking differently: Take 100 mg by mouth 2 (two) times daily.)   famotidine (PEPCID) 20 MG tablet Take 1  tablet (20 mg total) by mouth every morning. Reported on 04/16/2015   fluticasone (FLONASE) 50 MCG/ACT nasal spray SPRAY 2 SPRAYS INTO EACH NOSTRIL EVERY DAY (Patient taking differently: Place 2 sprays into both nostrils daily as needed for allergies.)   fluticasone-salmeterol (ADVAIR) 250-50 MCG/ACT AEPB Inhale 1 puff into the lungs in the morning and at bedtime.   Fluticasone-Umeclidin-Vilant (TRELEGY ELLIPTA) 100-62.5-25 MCG/INH AEPB Inhale 1 puff into the lungs 2 (two) times daily.   furosemide (LASIX) 20 MG tablet Take 1 tablet (20 mg total) by mouth daily.   insulin glargine (LANTUS) 100 UNIT/ML injection INJECT 0.4 MLS (40 UNITS TOTAL) INTO THE SKIN IN THE MORNING. (Patient taking differently: Inject 40 Units into the skin daily. INJECT 0.4 MLS (40 UNITS  TOTAL) INTO THE SKIN IN THE MORNING.)   Insulin Syringe-Needle U-100 (B-D INS SYR ULTRAFINE 1CC/30G) 30G X 1/2" 1 ML MISC Use 4 times a day with insulin Dx E11.9   Insulin Syringes, Disposable, U-100 1 ML MISC Use 4 times a day for insulin injection Dx E11.9   lisinopril (ZESTRIL) 10 MG tablet Take 1 tablet (10 mg total) by mouth daily.   NOVOLOG 100 UNIT/ML injection PER SLIDING SCALE: 190 - 200 = 2 UNITS 300 AND ABOVE = 7 UNITS (Patient taking differently: Inject 0-22 Units into the skin 3 (three) times daily with meals. Sliding scale)   rivaroxaban (XARELTO) 20 MG TABS tablet Take 1 tablet (20 mg total) by mouth daily with supper.   [START ON 12/25/2020] HYDROcodone-acetaminophen (NORCO/VICODIN) 5-325 MG tablet Take 1 tablet by mouth in the morning and at bedtime. (Patient not taking: No sig reported)   [DISCONTINUED] HYDROcodone-acetaminophen (NORCO/VICODIN) 5-325 MG tablet Take 1 tablet by mouth in the morning and at bedtime.   No facility-administered encounter medications on file as of 12/17/2020.    Allergies  Allergen Reactions   Azithromycin Other (See Comments) and Nausea And Vomiting    "Severe stomach cramps; told to list as an  allergy by dr. Huel Cote ago"   Avelox [Moxifloxacin Hcl In Nacl] Other (See Comments)    Hemolysis  In 2012   Bactrim [Sulfamethoxazole-Trimethoprim] Diarrhea and Nausea And Vomiting    Review of Systems  Constitutional:  Positive for activity change, appetite change and fatigue. Negative for chills, diaphoresis, fever and unexpected weight change.  HENT: Negative.    Eyes: Negative.   Respiratory:  Positive for cough, shortness of breath and wheezing. Negative for apnea, choking and chest tightness.   Cardiovascular:  Negative for chest pain, palpitations and leg swelling.  Gastrointestinal:  Positive for abdominal pain. Negative for abdominal distention, anal bleeding, blood in stool, constipation, diarrhea, nausea, rectal pain and vomiting.  Endocrine: Negative.   Genitourinary:  Negative for decreased urine volume, difficulty urinating, dysuria, frequency and urgency.  Musculoskeletal:  Negative for arthralgias and myalgias.  Skin: Negative.   Allergic/Immunologic: Negative.   Neurological:  Positive for weakness (generalized). Negative for dizziness, tremors, seizures, syncope, facial asymmetry, speech difficulty, light-headedness, numbness and headaches.  Hematological: Negative.   Psychiatric/Behavioral:  Negative for confusion, hallucinations, sleep disturbance and suicidal ideas.   All other systems reviewed and are negative.      Objective:  BP (!) 161/92   Pulse (!) 128   Temp 97.8 F (36.6 C)   Wt 179 lb (81.2 kg)   SpO2 100%   BMI 24.97 kg/m    Wt Readings from Last 3 Encounters:  12/17/20 179 lb (81.2 kg)  12/03/20 171 lb 6.4 oz (77.7 kg)  10/25/20 182 lb (82.6 kg)    Physical Exam Vitals and nursing note reviewed.  Constitutional:      General: He is not in acute distress.    Appearance: Normal appearance. He is well-developed, well-groomed and normal weight. He is not ill-appearing, toxic-appearing or diaphoretic.  HENT:     Head: Normocephalic and  atraumatic.     Jaw: There is normal jaw occlusion.     Right Ear: Hearing normal.     Left Ear: Hearing normal.     Nose: Nose normal.     Mouth/Throat:     Lips: Pink.     Mouth: Mucous membranes are moist.     Pharynx: Oropharynx is clear. Uvula midline.  Eyes:  General: Lids are normal.     Extraocular Movements: Extraocular movements intact.     Conjunctiva/sclera: Conjunctivae normal.     Pupils: Pupils are equal, round, and reactive to light.  Neck:     Thyroid: No thyroid mass, thyromegaly or thyroid tenderness.     Vascular: No carotid bruit or JVD.     Trachea: Trachea and phonation normal.  Cardiovascular:     Rate and Rhythm: Tachycardia present. Rhythm irregularly irregular.     Chest Wall: PMI is not displaced.     Pulses: Normal pulses.     Heart sounds: Murmur heard.  Systolic (loudest at apex) murmur is present with a grade of 2/6.    No friction rub. No gallop.  Pulmonary:     Effort: Pulmonary effort is normal. No respiratory distress.     Breath sounds: Normal breath sounds. No wheezing, rhonchi or rales.  Abdominal:     General: Bowel sounds are normal. There is no distension or abdominal bruit.     Palpations: Abdomen is soft. There is no hepatomegaly or splenomegaly.     Tenderness: There is no abdominal tenderness. There is no right CVA tenderness or left CVA tenderness.     Hernia: No hernia is present.  Musculoskeletal:        General: Normal range of motion.     Cervical back: Normal range of motion and neck supple.     Right lower leg: 1+ Edema present.     Left lower leg: 1+ Edema present.  Lymphadenopathy:     Cervical: No cervical adenopathy.  Skin:    General: Skin is warm and dry.     Capillary Refill: Capillary refill takes less than 2 seconds.     Coloration: Skin is not cyanotic, jaundiced or pale.     Findings: No rash.  Neurological:     General: No focal deficit present.     Mental Status: He is alert and oriented to person,  place, and time.     Cranial Nerves: Cranial nerves are intact. No cranial nerve deficit.     Sensory: Sensation is intact. No sensory deficit.     Motor: Motor function is intact. No weakness.     Coordination: Coordination is intact. Coordination normal.     Gait: Gait is intact. Gait normal.     Deep Tendon Reflexes: Reflexes are normal and symmetric. Reflexes normal.  Psychiatric:        Attention and Perception: Attention and perception normal.        Mood and Affect: Mood and affect normal.        Speech: Speech normal.        Behavior: Behavior normal. Behavior is cooperative.        Thought Content: Thought content normal.        Cognition and Memory: Cognition and memory normal.        Judgment: Judgment normal.    Results for orders placed or performed during the hospital encounter of 12/02/20  Resp Panel by RT-PCR (Flu A&B, Covid) Nasopharyngeal Swab   Specimen: Nasopharyngeal Swab; Nasopharyngeal(NP) swabs in vial transport medium  Result Value Ref Range   SARS Coronavirus 2 by RT PCR NEGATIVE NEGATIVE   Influenza A by PCR NEGATIVE NEGATIVE   Influenza B by PCR NEGATIVE NEGATIVE  MRSA Next Gen by PCR, Nasal   Specimen: Nasal Mucosa; Nasal Swab  Result Value Ref Range   MRSA by PCR Next Gen NOT DETECTED NOT DETECTED  CBC with Differential  Result Value Ref Range   WBC 8.2 4.0 - 10.5 K/uL   RBC 4.51 4.22 - 5.81 MIL/uL   Hemoglobin 16.5 13.0 - 17.0 g/dL   HCT 46.4 39.0 - 52.0 %   MCV 102.9 (H) 80.0 - 100.0 fL   MCH 36.6 (H) 26.0 - 34.0 pg   MCHC 35.6 30.0 - 36.0 g/dL   RDW 12.0 11.5 - 15.5 %   Platelets 186 150 - 400 K/uL   nRBC 0.0 0.0 - 0.2 %   Neutrophils Relative % 91 %   Neutro Abs 7.5 1.7 - 7.7 K/uL   Lymphocytes Relative 2 %   Lymphs Abs 0.2 (L) 0.7 - 4.0 K/uL   Monocytes Relative 6 %   Monocytes Absolute 0.5 0.1 - 1.0 K/uL   Eosinophils Relative 0 %   Eosinophils Absolute 0.0 0.0 - 0.5 K/uL   Basophils Relative 0 %   Basophils Absolute 0.0 0.0 - 0.1  K/uL   Immature Granulocytes 1 %   Abs Immature Granulocytes 0.04 0.00 - 0.07 K/uL  Comprehensive metabolic panel  Result Value Ref Range   Sodium 131 (L) 135 - 145 mmol/L   Potassium 4.3 3.5 - 5.1 mmol/L   Chloride 92 (L) 98 - 111 mmol/L   CO2 22 22 - 32 mmol/L   Glucose, Bld 193 (H) 70 - 99 mg/dL   BUN 19 8 - 23 mg/dL   Creatinine, Ser 1.22 0.61 - 1.24 mg/dL   Calcium 9.7 8.9 - 10.3 mg/dL   Total Protein 7.3 6.5 - 8.1 g/dL   Albumin 3.8 3.5 - 5.0 g/dL   AST 44 (H) 15 - 41 U/L   ALT 29 0 - 44 U/L   Alkaline Phosphatase 61 38 - 126 U/L   Total Bilirubin 4.3 (H) 0.3 - 1.2 mg/dL   GFR, Estimated >60 >60 mL/min   Anion gap 17 (H) 5 - 15  Lipase, blood  Result Value Ref Range   Lipase 317 (H) 11 - 51 U/L  Urinalysis, Routine w reflex microscopic Urine, Clean Catch  Result Value Ref Range   Color, Urine AMBER (A) YELLOW   APPearance HAZY (A) CLEAR   Specific Gravity, Urine 1.025 1.005 - 1.030   pH 5.0 5.0 - 8.0   Glucose, UA >=500 (A) NEGATIVE mg/dL   Hgb urine dipstick SMALL (A) NEGATIVE   Bilirubin Urine NEGATIVE NEGATIVE   Ketones, ur 80 (A) NEGATIVE mg/dL   Protein, ur 100 (A) NEGATIVE mg/dL   Nitrite NEGATIVE NEGATIVE   Leukocytes,Ua NEGATIVE NEGATIVE   RBC / HPF 0-5 0 - 5 RBC/hpf   WBC, UA 0-5 0 - 5 WBC/hpf   Bacteria, UA NONE SEEN NONE SEEN   Mucus PRESENT    Hyaline Casts, UA PRESENT   Lactic acid, plasma  Result Value Ref Range   Lactic Acid, Venous 1.9 0.5 - 1.9 mmol/L  Lipase, blood  Result Value Ref Range   Lipase 186 (H) 11 - 51 U/L  CBC  Result Value Ref Range   WBC 10.2 4.0 - 10.5 K/uL   RBC 4.23 4.22 - 5.81 MIL/uL   Hemoglobin 15.5 13.0 - 17.0 g/dL   HCT 45.9 39.0 - 52.0 %   MCV 108.5 (H) 80.0 - 100.0 fL   MCH 36.6 (H) 26.0 - 34.0 pg   MCHC 33.8 30.0 - 36.0 g/dL   RDW 12.6 11.5 - 15.5 %   Platelets 135 (L) 150 - 400 K/uL   nRBC 0.0  0.0 - 0.2 %  Comprehensive metabolic panel  Result Value Ref Range   Sodium 130 (L) 135 - 145 mmol/L   Potassium  4.2 3.5 - 5.1 mmol/L   Chloride 95 (L) 98 - 111 mmol/L   CO2 21 (L) 22 - 32 mmol/L   Glucose, Bld 187 (H) 70 - 99 mg/dL   BUN 15 8 - 23 mg/dL   Creatinine, Ser 1.17 0.61 - 1.24 mg/dL   Calcium 9.3 8.9 - 10.3 mg/dL   Total Protein 6.2 (L) 6.5 - 8.1 g/dL   Albumin 3.6 3.5 - 5.0 g/dL   AST 33 15 - 41 U/L   ALT 24 0 - 44 U/L   Alkaline Phosphatase 64 38 - 126 U/L   Total Bilirubin 2.5 (H) 0.3 - 1.2 mg/dL   GFR, Estimated >60 >60 mL/min   Anion gap 14 5 - 15  Hemoglobin A1c  Result Value Ref Range   Hgb A1c MFr Bld 5.7 (H) 4.8 - 5.6 %   Mean Plasma Glucose 116.89 mg/dL  Magnesium  Result Value Ref Range   Magnesium 1.3 (L) 1.7 - 2.4 mg/dL  Phosphorus  Result Value Ref Range   Phosphorus 3.1 2.5 - 4.6 mg/dL  Glucose, capillary  Result Value Ref Range   Glucose-Capillary 287 (H) 70 - 99 mg/dL  Comprehensive metabolic panel  Result Value Ref Range   Sodium 138 135 - 145 mmol/L   Potassium 3.5 3.5 - 5.1 mmol/L   Chloride 102 98 - 111 mmol/L   CO2 27 22 - 32 mmol/L   Glucose, Bld 93 70 - 99 mg/dL   BUN 8 8 - 23 mg/dL   Creatinine, Ser 0.92 0.61 - 1.24 mg/dL   Calcium 8.8 (L) 8.9 - 10.3 mg/dL   Total Protein 5.8 (L) 6.5 - 8.1 g/dL   Albumin 3.3 (L) 3.5 - 5.0 g/dL   AST 33 15 - 41 U/L   ALT 21 0 - 44 U/L   Alkaline Phosphatase 60 38 - 126 U/L   Total Bilirubin 1.7 (H) 0.3 - 1.2 mg/dL   GFR, Estimated >60 >60 mL/min   Anion gap 9 5 - 15  Glucose, capillary  Result Value Ref Range   Glucose-Capillary 216 (H) 70 - 99 mg/dL  Glucose, capillary  Result Value Ref Range   Glucose-Capillary 159 (H) 70 - 99 mg/dL  Glucose, capillary  Result Value Ref Range   Glucose-Capillary 82 70 - 99 mg/dL  Glucose, capillary  Result Value Ref Range   Glucose-Capillary 107 (H) 70 - 99 mg/dL  Hepatic function panel  Result Value Ref Range   Total Protein 6.0 (L) 6.5 - 8.1 g/dL   Albumin 3.2 (L) 3.5 - 5.0 g/dL   AST 31 15 - 41 U/L   ALT 18 0 - 44 U/L   Alkaline Phosphatase 59 38 - 126 U/L    Total Bilirubin 1.6 (H) 0.3 - 1.2 mg/dL   Bilirubin, Direct 0.3 (H) 0.0 - 0.2 mg/dL   Indirect Bilirubin 1.3 (H) 0.3 - 0.9 mg/dL  Glucose, capillary  Result Value Ref Range   Glucose-Capillary 341 (H) 70 - 99 mg/dL  Glucose, capillary  Result Value Ref Range   Glucose-Capillary 99 70 - 99 mg/dL  Glucose, capillary  Result Value Ref Range   Glucose-Capillary 172 (H) 70 - 99 mg/dL  Glucose, capillary  Result Value Ref Range   Glucose-Capillary 100 (H) 70 - 99 mg/dL  Basic metabolic panel  Result Value Ref Range  Sodium 136 135 - 145 mmol/L   Potassium 3.4 (L) 3.5 - 5.1 mmol/L   Chloride 102 98 - 111 mmol/L   CO2 24 22 - 32 mmol/L   Glucose, Bld 108 (H) 70 - 99 mg/dL   BUN 9 8 - 23 mg/dL   Creatinine, Ser 0.89 0.61 - 1.24 mg/dL   Calcium 8.7 (L) 8.9 - 10.3 mg/dL   GFR, Estimated >60 >60 mL/min   Anion gap 10 5 - 15  Magnesium  Result Value Ref Range   Magnesium 1.7 1.7 - 2.4 mg/dL  Phosphorus  Result Value Ref Range   Phosphorus 3.8 2.5 - 4.6 mg/dL  CBC with Differential/Platelet  Result Value Ref Range   WBC 6.7 4.0 - 10.5 K/uL   RBC 3.87 (L) 4.22 - 5.81 MIL/uL   Hemoglobin 14.3 13.0 - 17.0 g/dL   HCT 40.6 39.0 - 52.0 %   MCV 104.9 (H) 80.0 - 100.0 fL   MCH 37.0 (H) 26.0 - 34.0 pg   MCHC 35.2 30.0 - 36.0 g/dL   RDW 12.3 11.5 - 15.5 %   Platelets 99 (L) 150 - 400 K/uL   nRBC 0.0 0.0 - 0.2 %   Neutrophils Relative % 76 %   Neutro Abs 5.1 1.7 - 7.7 K/uL   Lymphocytes Relative 12 %   Lymphs Abs 0.8 0.7 - 4.0 K/uL   Monocytes Relative 9 %   Monocytes Absolute 0.6 0.1 - 1.0 K/uL   Eosinophils Relative 3 %   Eosinophils Absolute 0.2 0.0 - 0.5 K/uL   Basophils Relative 0 %   Basophils Absolute 0.0 0.0 - 0.1 K/uL   Immature Granulocytes 0 %   Abs Immature Granulocytes 0.02 0.00 - 0.07 K/uL  Glucose, capillary  Result Value Ref Range   Glucose-Capillary 119 (H) 70 - 99 mg/dL  Comprehensive metabolic panel  Result Value Ref Range   Sodium 134 (L) 135 - 145 mmol/L    Potassium 3.9 3.5 - 5.1 mmol/L   Chloride 99 98 - 111 mmol/L   CO2 27 22 - 32 mmol/L   Glucose, Bld 296 (H) 70 - 99 mg/dL   BUN 10 8 - 23 mg/dL   Creatinine, Ser 1.09 0.61 - 1.24 mg/dL   Calcium 8.7 (L) 8.9 - 10.3 mg/dL   Total Protein 5.6 (L) 6.5 - 8.1 g/dL   Albumin 2.8 (L) 3.5 - 5.0 g/dL   AST 27 15 - 41 U/L   ALT 19 0 - 44 U/L   Alkaline Phosphatase 62 38 - 126 U/L   Total Bilirubin 1.7 (H) 0.3 - 1.2 mg/dL   GFR, Estimated >60 >60 mL/min   Anion gap 8 5 - 15  Glucose, capillary  Result Value Ref Range   Glucose-Capillary 73 70 - 99 mg/dL  Glucose, capillary  Result Value Ref Range   Glucose-Capillary 168 (H) 70 - 99 mg/dL  Glucose, capillary  Result Value Ref Range   Glucose-Capillary 312 (H) 70 - 99 mg/dL  CBC  Result Value Ref Range   WBC 7.2 4.0 - 10.5 K/uL   RBC 3.71 (L) 4.22 - 5.81 MIL/uL   Hemoglobin 13.5 13.0 - 17.0 g/dL   HCT 39.2 39.0 - 52.0 %   MCV 105.7 (H) 80.0 - 100.0 fL   MCH 36.4 (H) 26.0 - 34.0 pg   MCHC 34.4 30.0 - 36.0 g/dL   RDW 12.2 11.5 - 15.5 %   Platelets 105 (L) 150 - 400 K/uL   nRBC 0.0 0.0 -  0.2 %  Comprehensive metabolic panel  Result Value Ref Range   Sodium 132 (L) 135 - 145 mmol/L   Potassium 3.7 3.5 - 5.1 mmol/L   Chloride 100 98 - 111 mmol/L   CO2 26 22 - 32 mmol/L   Glucose, Bld 200 (H) 70 - 99 mg/dL   BUN 10 8 - 23 mg/dL   Creatinine, Ser 1.01 0.61 - 1.24 mg/dL   Calcium 8.6 (L) 8.9 - 10.3 mg/dL   Total Protein 5.5 (L) 6.5 - 8.1 g/dL   Albumin 2.7 (L) 3.5 - 5.0 g/dL   AST 23 15 - 41 U/L   ALT 18 0 - 44 U/L   Alkaline Phosphatase 59 38 - 126 U/L   Total Bilirubin 1.4 (H) 0.3 - 1.2 mg/dL   GFR, Estimated >60 >60 mL/min   Anion gap 6 5 - 15  Magnesium  Result Value Ref Range   Magnesium 1.9 1.7 - 2.4 mg/dL  Glucose, capillary  Result Value Ref Range   Glucose-Capillary 309 (H) 70 - 99 mg/dL  Glucose, capillary  Result Value Ref Range   Glucose-Capillary 199 (H) 70 - 99 mg/dL  Glucose, capillary  Result Value Ref  Range   Glucose-Capillary 251 (H) 70 - 99 mg/dL  CBG monitoring, ED  Result Value Ref Range   Glucose-Capillary 343 (H) 70 - 99 mg/dL  CBG monitoring, ED  Result Value Ref Range   Glucose-Capillary 228 (H) 70 - 99 mg/dL  CBG monitoring, ED  Result Value Ref Range   Glucose-Capillary 193 (H) 70 - 99 mg/dL  CBG monitoring, ED  Result Value Ref Range   Glucose-Capillary 206 (H) 70 - 99 mg/dL   Comment 1 Notify RN    Comment 2 Document in Chart   ECHOCARDIOGRAM COMPLETE  Result Value Ref Range   Weight 2,742.42 oz   Height 71 in   BP 119/67 mmHg   Single Plane A2C EF 45.7 %   Single Plane A4C EF 33.7 %   Calc EF 37.7 %   S' Lateral 4.10 cm   AR max vel 2.35 cm2   AV Area VTI 2.16 cm2   AV Mean grad 1.0 mmHg   AV Peak grad 2.0 mmHg   Ao pk vel 0.71 m/s   AV Area mean vel 2.20 cm2  Troponin I (High Sensitivity)  Result Value Ref Range   Troponin I (High Sensitivity) 15 <18 ng/L     X-Ray: CXR: Improved pleural effusion from CXR during admission. Preliminary x-ray reading by Monia Pouch, FNP-C, [facility].  EKG: A-Fib with RVR. No acute ST-T changes, no ectopy. Monia Pouch, FNP-C  Pertinent labs & imaging results that were available during my care of the patient were reviewed by me and considered in my medical decision making.  Assessment & Plan:  Demtrius was seen today for transitions of care.  Diagnoses and all orders for this visit:  Hospital discharge follow-up Very complex case with multiple comorbidities and new diagnoses. Will repeat below labs today. CXR improving. EKG shows continued A-Fib with RVR. Pt has history of noncompliance with medication regimens and treatment. Discussed importance of treatment compliance to prevent worsening symptoms or possible death. Urgent referral placed to cardiology and A-Fib clinic. Start Xarelto 20 mg nightly, continue PPI due to history of prior gastric ulcer. Pt aware to report any signs of bleeding. Cardizem CD 120 mg once daily  for better compliance with medication regimen. Lasix 20 mg daily.  -     EKG  12-Lead -     CBC with Differential/Platelet -     CMP14+EGFR -     Amylase -     Lipase -     DG Chest 2 View; Future -     Brain natriuretic peptide -     diltiazem (CARDIZEM CD) 120 MG 24 hr capsule; Take 1 capsule (120 mg total) by mouth daily. -     rivaroxaban (XARELTO) 20 MG TABS tablet; Take 1 tablet (20 mg total) by mouth daily with supper. -     Ambulatory referral to Cardiology -     Amb Referral to AFIB Clinic -     furosemide (LASIX) 20 MG tablet; Take 1 tablet (20 mg total) by mouth daily.  Tachycardia Atrial fibrillation with RVR (HCC) EKG shows continued A-Fib with RVR. Pt has history of noncompliance with medication regimens and treatment. Discussed importance of treatment compliance to prevent worsening symptoms or possible death. Urgent referral placed to cardiology and A-Fib clinic. Spoke with A-Fib clinic and they will try to get him in this week. Start Xarelto 20 mg nightly, continue PPI due to history of prior gastric ulcer. Pt aware to report any signs or symptoms of bleeding. Cardizem CD 120 mg once daily for better compliance with medication regimen. Lasix 20 mg daily.  -     EKG 12-Lead -     Ambulatory referral to Cardiology -     Amb Referral to AFIB Clinic -     diltiazem (CARDIZEM CD) 120 MG 24 hr capsule; Take 1 capsule (120 mg total) by mouth daily. -     rivaroxaban (XARELTO) 20 MG TABS tablet; Take 1 tablet (20 mg total) by mouth daily with supper. -     Ambulatory referral to Cardiology -     Amb Referral to AFIB Clinic  Essential hypertension Poorly controlled. Lasix and cardizem added today which will help with BP control. DASH diet and exercise encouraged. Labs pending.  -     EKG 12-Lead -     CBC with Differential/Platelet -     CMP14+EGFR -     Ambulatory referral to Cardiology -     Amb Referral to AFIB Clinic  Shortness of breath Mucopurulent chronic bronchitis  (HCC) CXR showing improvement in pleural effusions. Lasix prescribed today. Not concerning for pneumonia, will start treatment if radiology findings differ.  -     DG Chest 2 View; Future -     Brain natriuretic peptide  Alcohol-induced chronic pancreatitis ([place]) Repots he stopped drinking. Will recheck amylase and lipase.  -     Amylase -     Lipase  Hyponatremia Will recheck labs today.  -     CMP14+EGFR  Delirium tremens ([place]) States he has stopped drinking. No continued confusion or delirium reported.   Acute systolic heart failure (HCC) Echo revealed EF 30-35% during hospitalization. Will check below labs today. Start lasix as prescribed. Urgent referral to cardiology. CXR showing improvements in pleural effusions.  -     EKG 12-Lead -     CBC with Differential/Platelet -     CMP14+EGFR -     DG Chest 2 View; Future -     Brain natriuretic peptide -     Ambulatory referral to Cardiology -     furosemide (LASIX) 20 MG tablet; Take 1 tablet (20 mg total) by mouth daily.  Type 2 diabetes mellitus without complication, with long-term current use of insulin (HCC) A1C 5.7 during admission. Will  recheck CBC and CMP today.  -     EKG 12-Lead -     CBC with Differential/Platelet -     CMP14+EGFR    Continue all other maintenance medications.  Follow up plan: Return in about 1 week (around 12/24/2020), or if symptoms worsen or fail to improve, for with PCP.   Continue healthy lifestyle choices, including diet (rich in fruits, vegetables, and lean proteins, and low in salt and simple carbohydrates) and exercise (at least 30 minutes of moderate physical activity daily).   The above assessment and management plan was discussed with the patient. The patient verbalized understanding of and has agreed to the management plan. Patient is aware to call the clinic if they develop any new symptoms or if symptoms persist or worsen. Patient is aware when to return to the clinic for a  follow-up visit. Patient educated on when it is appropriate to go to the emergency department.   Monia Pouch, FNP-C [facility] [phone]

## 2020-12-18 ENCOUNTER — Telehealth (HOSPITAL_COMMUNITY): Payer: Self-pay | Admitting: Physician Assistant

## 2020-12-18 LAB — CMP14+EGFR
ALT: 12 IU/L (ref 0–44)
AST: 20 IU/L (ref 0–40)
Albumin/Globulin Ratio: 1.3 (ref 1.2–2.2)
Albumin: 3.7 g/dL — ABNORMAL LOW (ref 3.8–4.8)
Alkaline Phosphatase: 80 IU/L (ref 44–121)
BUN/Creatinine Ratio: 9 — ABNORMAL LOW (ref 10–24)
BUN: 9 mg/dL (ref 8–27)
Bilirubin Total: 0.3 mg/dL (ref 0.0–1.2)
CO2: 22 mmol/L (ref 20–29)
Calcium: 9.5 mg/dL (ref 8.6–10.2)
Chloride: 99 mmol/L (ref 96–106)
Creatinine, Ser: 1 mg/dL (ref 0.76–1.27)
Globulin, Total: 2.9 g/dL (ref 1.5–4.5)
Glucose: 120 mg/dL — ABNORMAL HIGH (ref 70–99)
Potassium: 4.6 mmol/L (ref 3.5–5.2)
Sodium: 138 mmol/L (ref 134–144)
Total Protein: 6.6 g/dL (ref 6.0–8.5)
eGFR: 85 mL/min/{1.73_m2} (ref >59)

## 2020-12-18 LAB — CBC WITH DIFFERENTIAL/PLATELET
Basophils Absolute: 0.1 10*3/uL (ref 0.0–0.2)
Basos: 1 %
EOS (ABSOLUTE): 0.1 10*3/uL (ref 0.0–0.4)
Eos: 2 %
Hematocrit: 38.2 % (ref 37.5–51.0)
Hemoglobin: 13.4 g/dL (ref 13.0–17.7)
Immature Grans (Abs): 0 10*3/uL (ref 0.0–0.1)
Immature Granulocytes: 1 %
Lymphocytes Absolute: 1.1 10*3/uL (ref 0.7–3.1)
Lymphs: 17 %
MCH: 36.3 pg — ABNORMAL HIGH (ref 26.6–33.0)
MCHC: 35.1 g/dL (ref 31.5–35.7)
MCV: 104 fL — ABNORMAL HIGH (ref 79–97)
Monocytes Absolute: 0.7 10*3/uL (ref 0.1–0.9)
Monocytes: 10 %
Neutrophils Absolute: 4.6 10*3/uL (ref 1.4–7.0)
Neutrophils: 69 %
Platelets: 342 10*3/uL (ref 150–450)
RBC: 3.69 x10E6/uL — ABNORMAL LOW (ref 4.14–5.80)
RDW: 11.6 % (ref 11.6–15.4)
WBC: 6.5 10*3/uL (ref 3.4–10.8)

## 2020-12-18 LAB — BRAIN NATRIURETIC PEPTIDE: BNP: 200 pg/mL — ABNORMAL HIGH (ref 0.0–100.0)

## 2020-12-18 LAB — AMYLASE: Amylase: 61 U/L (ref 31–110)

## 2020-12-18 LAB — LIPASE: Lipase: 26 U/L (ref 13–78)

## 2020-12-18 NOTE — Telephone Encounter (Signed)
Error

## 2020-12-23 ENCOUNTER — Other Ambulatory Visit: Payer: Self-pay | Admitting: Nurse Practitioner

## 2020-12-23 DIAGNOSIS — Z794 Long term (current) use of insulin: Secondary | ICD-10-CM

## 2020-12-23 DIAGNOSIS — E119 Type 2 diabetes mellitus without complications: Secondary | ICD-10-CM

## 2020-12-25 ENCOUNTER — Ambulatory Visit (HOSPITAL_COMMUNITY)
Admission: RE | Admit: 2020-12-25 | Discharge: 2020-12-25 | Disposition: A | Discharge status: Home / Self Care | Payer: 59 | Source: Ambulatory Visit | Attending: Physician Assistant | Admitting: Physician Assistant

## 2020-12-25 ENCOUNTER — Other Ambulatory Visit: Payer: Self-pay

## 2020-12-25 ENCOUNTER — Encounter (HOSPITAL_COMMUNITY): Payer: Self-pay | Admitting: Physician Assistant

## 2020-12-25 VITALS — BP 114/60 | HR 94 | Ht 71.0 in | Wt 170.4 lb

## 2020-12-25 DIAGNOSIS — F1010 Alcohol abuse, uncomplicated: Secondary | ICD-10-CM | POA: Diagnosis not present

## 2020-12-25 DIAGNOSIS — Z7901 Long term (current) use of anticoagulants: Secondary | ICD-10-CM | POA: Diagnosis not present

## 2020-12-25 DIAGNOSIS — Z09 Encounter for follow-up examination after completed treatment for conditions other than malignant neoplasm: Secondary | ICD-10-CM

## 2020-12-25 DIAGNOSIS — E108 Type 1 diabetes mellitus with unspecified complications: Secondary | ICD-10-CM | POA: Diagnosis not present

## 2020-12-25 DIAGNOSIS — I5022 Chronic systolic (congestive) heart failure: Secondary | ICD-10-CM | POA: Diagnosis not present

## 2020-12-25 DIAGNOSIS — Z79899 Other long term (current) drug therapy: Secondary | ICD-10-CM | POA: Diagnosis not present

## 2020-12-25 DIAGNOSIS — I5021 Acute systolic (congestive) heart failure: Secondary | ICD-10-CM | POA: Diagnosis not present

## 2020-12-25 DIAGNOSIS — I110 Hypertensive heart disease with heart failure: Secondary | ICD-10-CM | POA: Diagnosis not present

## 2020-12-25 DIAGNOSIS — Z8249 Family history of ischemic heart disease and other diseases of the circulatory system: Secondary | ICD-10-CM | POA: Diagnosis not present

## 2020-12-25 DIAGNOSIS — F17210 Nicotine dependence, cigarettes, uncomplicated: Secondary | ICD-10-CM | POA: Insufficient documentation

## 2020-12-25 DIAGNOSIS — I4892 Unspecified atrial flutter: Secondary | ICD-10-CM | POA: Diagnosis not present

## 2020-12-25 DIAGNOSIS — J449 Chronic obstructive pulmonary disease, unspecified: Secondary | ICD-10-CM | POA: Insufficient documentation

## 2020-12-25 DIAGNOSIS — I4819 Other persistent atrial fibrillation: Secondary | ICD-10-CM | POA: Diagnosis not present

## 2020-12-25 DIAGNOSIS — D6869 Other thrombophilia: Secondary | ICD-10-CM | POA: Insufficient documentation

## 2020-12-25 DIAGNOSIS — Z794 Long term (current) use of insulin: Secondary | ICD-10-CM | POA: Diagnosis not present

## 2020-12-25 MED ORDER — FUROSEMIDE 20 MG PO TABS: 20.0000 mg | ORAL_TABLET | Freq: Every day | ORAL | 3 refills | Status: DC | PRN

## 2020-12-25 MED ORDER — CARVEDILOL 3.125 MG PO TABS: 3.1250 mg | ORAL_TABLET | Freq: Two times a day (BID) | ORAL | 11 refills | Status: DC

## 2020-12-25 NOTE — Progress Notes (Signed)
Primary Care Physician: Chevis Pretty, [place] Primary Cardiologist: Dr Gardiner Rhyme (new) Primary Electrophysiologist: none Referring Physician: Darla Lesches NP   Thomas Sandoval. is a 65 y.o. male with a history of alcohol abuse, tobacco abuse, COPD, MVP, perforated gastric ulcer s/p repair 2016, DM, HTN, PAD (bilateral fem-pop occlusion), GERD, systolic CHF, atrial flutter, atrial fibrillation who presents for consultation in the [facility]. The patient was initially diagnosed with atrial flutter in 2012 and afib in the setting of his perforated ulcer. Patient has a CHADS2VASC score of 4, has declined anticoagulation in the past. He was hospitalized 9/26-9/30/22 with acute alcoholic pancreatitis and was also found to be in afib at the time. Echo showed EF 30-35%. He left the hospital AMA. At his visit with his PCP 12/17/20 he was still in afib and was started on diltiazem and Xarelto. Patient does report he feels fatigue with exertion. He remains in afib but his heart rate is better controlled. He denies any bleeding issues on anticoagulation.   Today, he denies symptoms of palpitations, chest pain, orthopnea, PND, lower extremity edema, dizziness, presyncope, syncope, snoring, daytime somnolence, bleeding, or neurologic sequela. The patient is tolerating medications without difficulties and is otherwise without complaint today.    Atrial Fibrillation Risk Factors:  he does not have symptoms or diagnosis of sleep apnea. he does not have a history of rheumatic fever. he does have a history of alcohol use. The patient does not have a history of early familial atrial fibrillation or other arrhythmias.  he has a BMI of Body mass index is 23.77 kg/m.Marland Kitchen Filed Weights   12/25/20 1514  Weight: 77.3 kg    Family History  Problem Relation Age of Onset   Breast cancer Mother    Cancer Mother        Breast   Rheumatic fever Father    Heart disease Father     Heart attack Father        Massive    Diabetes Son      Atrial Fibrillation Management history:  Previous antiarrhythmic drugs: none Previous cardioversions: 08/25/17 Previous ablations: none CHADS2VASC score: 4 Anticoagulation history: Xarelto    Past Medical History:  Diagnosis Date   Arthritis    Bladder cancer ([place])    Chronic back pain    COPD (chronic obstructive pulmonary disease) ([place])    DDD (degenerative disc disease)    Diabetic retinopathy of both eyes ([place])    Essential hypertension    GERD (gastroesophageal reflux disease)    History of atrial flutter 02/2011   Converted to NSR with Cardizem   History of chronic bronchitis    History of hemolytic anemia 02/2011   secondary to Avelox   HOH (hard of hearing)    HOH (hard of hearing)    no eardrum and nerve damage on R, also HOH on L   Mitral valve prolapse    a. 2D Echo 11/27/14: EF 55-60%; images were inadequate for LV wall motion assessment, + mild late systolic mitral valve prolapse involving the anterior leaflet.   PAD (peripheral artery disease) ([place]) 04/2014   Dr Trula Slade; bilateral SFA occlusion, R mid, L distal   PAF (paroxysmal atrial fibrillation) ([place])    a. Dx 11/2014 during admission for perf ulcer.   Perforated ulcer ([place])    a. 11/2014 s/p surgery.   Productive cough    Smokers' cough ([place])    Type 1 diabetes mellitus ([place]) 1977   Past Surgical  History:  Procedure Laterality Date   CYSTOSCOPY WITH URETEROSCOPY Right 08/14/2013   Procedure: CYSTOSCOPY WITH URETEROSCOPY BLADDER BIOPSY ;  Surgeon: Claybon Jabs, MD;  Location: [facility];  Service: Urology;  Laterality: Right;   ESOPHAGOGASTRODUODENOSCOPY N/A 02/06/2013   Procedure: ESOPHAGOGASTRODUODENOSCOPY (EGD);  Surgeon: Lafayette Dragon, MD;  Location: [facility] ENDOSCOPY;  Service: Endoscopy;  Laterality: N/A;   LAPAROSCOPY N/A 11/25/2014   Procedure: LAPAROSCOPIC PRIMARY REPAIR OF PERFORATED PREPYLORIC ULCER WITH Silvestre Gunner;   Surgeon: Greer Pickerel, MD;  Location: [facility];  Service: General;  Laterality: N/A;   TONSILLECTOMY  as child   TRANSTHORACIC ECHOCARDIOGRAM  02-17-2011   MODERATE LVH/  EF 65%   TRANSURETHRAL RESECTION OF BLADDER TUMOR WITH GYRUS (TURBT-GYRUS) N/A 06/12/2013   Procedure: TRANSURETHRAL RESECTION OF BLADDER TUMOR WITH GYRUS (TURBT-GYRUS);  Surgeon: Claybon Jabs, MD;  Location: [facility];  Service: Urology;  Laterality: N/A;   TYMPANIC MEMBRANE REPAIR  as child    Current Outpatient Medications  Medication Sig Dispense Refill   albuterol (PROVENTIL HFA;VENTOLIN HFA) 108 (90 Base) MCG/ACT inhaler Inhale 2 puffs every 6 (six) hours as needed into the lungs for wheezing or shortness of breath. 8.5 Inhaler 2   carvedilol (COREG) 3.125 MG tablet Take 1 tablet (3.125 mg total) by mouth 2 (two) times daily. 60 tablet 11   cetirizine (ZYRTEC) 10 MG tablet Take 10 mg by mouth daily as needed for allergies.     famotidine (PEPCID) 20 MG tablet Take 1 tablet (20 mg total) by mouth every morning. Reported on 04/16/2015 30 tablet 5   fluticasone-salmeterol (ADVAIR) 250-50 MCG/ACT AEPB Inhale 1 puff into the lungs in the morning and at bedtime.     Fluticasone-Umeclidin-Vilant (TRELEGY ELLIPTA) 100-62.5-25 MCG/INH AEPB Inhale 1 puff into the lungs 2 (two) times daily. 60 each 5   insulin glargine (LANTUS) 100 UNIT/ML injection INJECT 0.4 MLS (40 UNITS TOTAL) INTO THE SKIN IN THE MORNING. 10 mL 0   Insulin Syringe-Needle U-100 (B-D INS SYR ULTRAFINE 1CC/30G) 30G X 1/2" 1 ML MISC Use 4 times a day with insulin Dx E11.9 400 each 3   Insulin Syringes, Disposable, U-100 1 ML MISC Use 4 times a day for insulin injection Dx E11.9 400 each 5   lisinopril (ZESTRIL) 10 MG tablet Take 1 tablet (10 mg total) by mouth daily. 90 tablet 3   NOVOLOG 100 UNIT/ML injection PER SLIDING SCALE: 190 - 200 = 2 UNITS 300 AND ABOVE = 7 UNITS (Patient taking differently: Inject 0-22 Units into the skin 3 (three) times daily  with meals. Sliding scale) 10 mL 0   rivaroxaban (XARELTO) 20 MG TABS tablet Take 1 tablet (20 mg total) by mouth daily with supper. 30 tablet 1   furosemide (LASIX) 20 MG tablet Take 1 tablet (20 mg total) by mouth daily as needed for fluid or edema. 30 tablet 3   No current facility-administered medications for this encounter.    Allergies  Allergen Reactions   Azithromycin Other (See Comments) and Nausea And Vomiting    "Severe stomach cramps; told to list as an allergy by dr. Huel Cote ago"   Avelox [Moxifloxacin Hcl In Nacl] Other (See Comments)    Hemolysis  In 2012   Bactrim [Sulfamethoxazole-Trimethoprim] Diarrhea and Nausea And Vomiting    Social History   Socioeconomic History   Marital status: Married    Spouse name: Not on file   Number of children: Not on file   Years of education:  Not on file   Highest education level: Not on file  Occupational History   Occupation: Tobacco Farmer  Tobacco Use   Smoking status: Every Day    Packs/day: 2.00    Years: 30.00    Pack years: 60.00    Types: Cigarettes   Smokeless tobacco: Former    Quit date: 06/08/1978   Tobacco comments:    Couple packs per day 12/25/2020  Vaping Use   Vaping Use: Never used  Substance and Sexual Activity   Alcohol use: Yes    Comment: 5 quarts per week   Drug use: No   Sexual activity: Not on file  Other Topics Concern   Not on file  Social History Narrative   Lives in [place] with wife and 2 sons.    Social Determinants of Health   Financial Resource Strain: Not on file  Food Insecurity: Not on file  Transportation Needs: Not on file  Physical Activity: Not on file  Stress: Not on file  Social Connections: Not on file  Intimate Partner Violence: Not on file     ROS- All systems are reviewed and negative except as per the HPI above.  Physical Exam: Vitals:   12/25/20 1514  BP: 114/60  Pulse: 94  SpO2: 96%  Weight: 77.3 kg  Height: 5\' 11"  (1.803 m)    GEN- The patient is a  well appearing male, alert and oriented x 3 today.   Head- normocephalic, atraumatic Eyes-  Sclera clear, conjunctiva pink Ears- hearing intact Oropharynx- clear Neck- supple  Lungs- Clear to ausculation bilaterally, normal work of breathing Heart- irregular rate and rhythm, no murmurs, rubs or gallops  GI- soft, NT, ND, + BS Extremities- no clubbing, cyanosis, or edema MS- no significant deformity or atrophy Skin- no rash or lesion Psych- euthymic mood, full affect Neuro- strength and sensation are intact  Wt Readings from Last 3 Encounters:  12/25/20 77.3 kg  12/17/20 81.2 kg  12/03/20 77.7 kg    EKG today demonstrates  Atypical atrial flutter with variable block vs coarse afib Vent. rate 94 BPM PR interval * ms QRS duration 78 ms QT/QTcB 354/442 ms  Echo 12/05/20 demonstrated   1. Left ventricular ejection fraction, by estimation, is 30 to 35%. Left  ventricular ejection fraction by 3D volume is 34 %. The left ventricle has moderately decreased function. The left ventricle demonstrates global hypokinesis. Left ventricular diastolic parameters are indeterminate.   2. Right ventricular systolic function is low normal. The right  ventricular size is normal. There is normal pulmonary artery systolic  pressure.   3. Left atrial size was moderately dilated.   4. The mitral valve is abnormal. Trivial mitral valve regurgitation  related to prolapse. No evidence of mitral stenosis.   5. The aortic valve is tricuspid. Aortic valve regurgitation is not  visualized. No aortic stenosis is present.   Comparison(s): A prior study was performed on 08/26/2017. Decrease in LVEF.   Epic records are reviewed at length today  CHA2DS2-VASc Score = 4  The patient's score is based upon: CHF History: 1 HTN History: 1 Diabetes History: 1 Stroke History: 0 Vascular Disease History: 1 Age Score: 0 Gender Score: 0      ASSESSMENT AND PLAN: 1. Persistent Atrial Fibrillation/atrial  flutter The patient's CHA2DS2-VASc score is 4, indicating a 4.8% annual risk of stroke.   General education about afib provided and questions answered. We also discussed his stroke risk and the risks and benefits of anticoagulation. Will change  rate control from diltiazem to carvedilol 3.125 mg BID given his systolic dysfunction. We discussed DCCV today, will need to wait for 3 weeks of uninterrupted anticoagulation. He reports he missed a dose yesterday.  Continue Xarelto 20 mg daily, stressed importance of not missing any doses.   2. Secondary Hypercoagulable State (ICD10:  D68.69) The patient is at significant risk for stroke/thromboembolism based upon his CHA2DS2-VASc Score of 4.  Continue Rivaroxaban (Xarelto).   3. HFrEF EF 30-35% ? Related to afib vs alcohol abuse. Could consider ischemic workup given PAD, DM, and tobacco abuse, no angina.  No signs or symptoms of fluid overload today.  Will have him establish care with primary cardiologist for guideline directed medical therapy.   4. ETOH abuse Reduction/cessation advised. He reports he has not had anything to drink since his hospitalization.   5. HTN Stable, med changes as above.    Follow up in the AF clinic in 2-3 weeks. Follow up to establish with primary cardiologist as scheduled.    [facility] [address] [place], [place] [address] [phone] 12/25/2020 4:22 PM

## 2020-12-25 NOTE — Patient Instructions (Signed)
Stop Aspirin 81mg  Stop Diltiazem 120mg   Start Carvedilol 31.25 twice daily   Furosemide 20mg  As Needed

## 2021-01-08 ENCOUNTER — Other Ambulatory Visit: Payer: Self-pay

## 2021-01-08 ENCOUNTER — Ambulatory Visit (HOSPITAL_COMMUNITY)
Admission: RE | Admit: 2021-01-08 | Discharge: 2021-01-08 | Disposition: A | Discharge status: Home / Self Care | Payer: 59 | Source: Ambulatory Visit | Attending: Physician Assistant | Admitting: Physician Assistant

## 2021-01-08 ENCOUNTER — Encounter (HOSPITAL_COMMUNITY): Payer: Self-pay | Admitting: Physician Assistant

## 2021-01-08 VITALS — BP 148/76 | HR 92 | Ht 71.0 in | Wt 172.2 lb

## 2021-01-08 DIAGNOSIS — Z7901 Long term (current) use of anticoagulants: Secondary | ICD-10-CM | POA: Insufficient documentation

## 2021-01-08 DIAGNOSIS — I4819 Other persistent atrial fibrillation: Secondary | ICD-10-CM | POA: Diagnosis not present

## 2021-01-08 DIAGNOSIS — I509 Heart failure, unspecified: Secondary | ICD-10-CM | POA: Diagnosis not present

## 2021-01-08 DIAGNOSIS — D6869 Other thrombophilia: Secondary | ICD-10-CM | POA: Diagnosis not present

## 2021-01-08 DIAGNOSIS — I10 Essential (primary) hypertension: Secondary | ICD-10-CM | POA: Diagnosis not present

## 2021-01-08 DIAGNOSIS — I4892 Unspecified atrial flutter: Secondary | ICD-10-CM | POA: Insufficient documentation

## 2021-01-08 DIAGNOSIS — F17210 Nicotine dependence, cigarettes, uncomplicated: Secondary | ICD-10-CM | POA: Insufficient documentation

## 2021-01-08 DIAGNOSIS — Z8249 Family history of ischemic heart disease and other diseases of the circulatory system: Secondary | ICD-10-CM | POA: Diagnosis not present

## 2021-01-08 LAB — CBC
HCT: 42.8 % (ref 39.0–52.0)
Hemoglobin: 14.3 g/dL (ref 13.0–17.0)
MCH: 35 pg — ABNORMAL HIGH (ref 26.0–34.0)
MCHC: 33.4 g/dL (ref 30.0–36.0)
MCV: 104.9 fL — ABNORMAL HIGH (ref 80.0–100.0)
Platelets: 246 10*3/uL (ref 150–400)
RBC: 4.08 MIL/uL — ABNORMAL LOW (ref 4.22–5.81)
RDW: 11.9 % (ref 11.5–15.5)
WBC: 7.9 10*3/uL (ref 4.0–10.5)
nRBC: 0 % (ref 0.0–0.2)

## 2021-01-08 LAB — BASIC METABOLIC PANEL
Anion gap: 6 (ref 5–15)
BUN: 10 mg/dL (ref 8–23)
CO2: 26 mmol/L (ref 22–32)
Calcium: 9.1 mg/dL (ref 8.9–10.3)
Chloride: 103 mmol/L (ref 98–111)
Creatinine, Ser: 1.01 mg/dL (ref 0.61–1.24)
GFR, Estimated: >60 mL/min (ref >60)
Glucose, Bld: 92 mg/dL (ref 70–99)
Potassium: 4.6 mmol/L (ref 3.5–5.1)
Sodium: 135 mmol/L (ref 135–145)

## 2021-01-08 NOTE — Progress Notes (Signed)
Primary Care Physician: Chevis Pretty, [place] Primary Cardiologist: Dr Gardiner Rhyme (new) Primary Electrophysiologist: none Referring Physician: Darla Lesches NP   Thomas Sandoval. is a 65 y.o. male with a history of alcohol abuse, tobacco abuse, COPD, MVP, perforated gastric ulcer s/p repair 2016, DM, HTN, PAD (bilateral fem-pop occlusion), GERD, systolic CHF, atrial flutter, atrial fibrillation who presents for follow up in the [facility]. The patient was initially diagnosed with atrial flutter in 2012 and afib in the setting of his perforated ulcer. Patient has a CHADS2VASC score of 4, has declined anticoagulation in the past. He was hospitalized 9/26-9/30/22 with acute alcoholic pancreatitis and was also found to be in afib at the time. Echo showed EF 30-35%. He left the hospital AMA. At his visit with his PCP 12/17/20 he was still in afib and was started on diltiazem and Xarelto.   On follow up today, patient report that he has noticed some improvement in his fatigue since his last visit although he is not back to baseline. He denies any missed doses of anticoagulation since his last visit.   Today, he denies symptoms of palpitations, chest pain, orthopnea, PND, lower extremity edema, dizziness, presyncope, syncope, snoring, daytime somnolence, bleeding, or neurologic sequela. The patient is tolerating medications without difficulties and is otherwise without complaint today.    Atrial Fibrillation Risk Factors:  he does not have symptoms or diagnosis of sleep apnea. he does not have a history of rheumatic fever. he does have a history of alcohol use. The patient does not have a history of early familial atrial fibrillation or other arrhythmias.  he has a BMI of Body mass index is 24.02 kg/m.Marland Kitchen Filed Weights   01/08/21 1517  Weight: 78.1 kg     Family History  Problem Relation Age of Onset   Breast cancer Mother    Cancer Mother        Breast    Rheumatic fever Father    Heart disease Father    Heart attack Father        Massive    Diabetes Son      Atrial Fibrillation Management history:  Previous antiarrhythmic drugs: none Previous cardioversions: 08/25/17 Previous ablations: none CHADS2VASC score: 4 Anticoagulation history: Xarelto    Past Medical History:  Diagnosis Date   Arthritis    Bladder cancer ([place])    Chronic back pain    COPD (chronic obstructive pulmonary disease) ([place])    DDD (degenerative disc disease)    Diabetic retinopathy of both eyes ([place])    Essential hypertension    GERD (gastroesophageal reflux disease)    History of atrial flutter 02/2011   Converted to NSR with Cardizem   History of chronic bronchitis    History of hemolytic anemia 02/2011   secondary to Avelox   HOH (hard of hearing)    HOH (hard of hearing)    no eardrum and nerve damage on R, also HOH on L   Mitral valve prolapse    a. 2D Echo 11/27/14: EF 55-60%; images were inadequate for LV wall motion assessment, + mild late systolic mitral valve prolapse involving the anterior leaflet.   PAD (peripheral artery disease) ([place]) 04/2014   Dr Trula Slade; bilateral SFA occlusion, R mid, L distal   PAF (paroxysmal atrial fibrillation) ([place])    a. Dx 11/2014 during admission for perf ulcer.   Perforated ulcer ([place])    a. 11/2014 s/p surgery.   Productive cough    Smokers'  cough ([place])    Type 1 diabetes mellitus ([place]) 1977   Past Surgical History:  Procedure Laterality Date   CYSTOSCOPY WITH URETEROSCOPY Right 08/14/2013   Procedure: CYSTOSCOPY WITH URETEROSCOPY BLADDER BIOPSY ;  Surgeon: Claybon Jabs, MD;  Location: [facility];  Service: Urology;  Laterality: Right;   ESOPHAGOGASTRODUODENOSCOPY N/A 02/06/2013   Procedure: ESOPHAGOGASTRODUODENOSCOPY (EGD);  Surgeon: Lafayette Dragon, MD;  Location: [facility] ENDOSCOPY;  Service: Endoscopy;  Laterality: N/A;   LAPAROSCOPY N/A 11/25/2014   Procedure: LAPAROSCOPIC PRIMARY REPAIR OF  PERFORATED PREPYLORIC ULCER WITH Silvestre Gunner;  Surgeon: Greer Pickerel, MD;  Location: [facility];  Service: General;  Laterality: N/A;   TONSILLECTOMY  as child   TRANSTHORACIC ECHOCARDIOGRAM  02-17-2011   MODERATE LVH/  EF 65%   TRANSURETHRAL RESECTION OF BLADDER TUMOR WITH GYRUS (TURBT-GYRUS) N/A 06/12/2013   Procedure: TRANSURETHRAL RESECTION OF BLADDER TUMOR WITH GYRUS (TURBT-GYRUS);  Surgeon: Claybon Jabs, MD;  Location: [facility];  Service: Urology;  Laterality: N/A;   TYMPANIC MEMBRANE REPAIR  as child    Current Outpatient Medications  Medication Sig Dispense Refill   albuterol (PROVENTIL HFA;VENTOLIN HFA) 108 (90 Base) MCG/ACT inhaler Inhale 2 puffs every 6 (six) hours as needed into the lungs for wheezing or shortness of breath. 8.5 Inhaler 2   carvedilol (COREG) 3.125 MG tablet Take 1 tablet (3.125 mg total) by mouth 2 (two) times daily. 60 tablet 11   cetirizine (ZYRTEC) 10 MG tablet Take 10 mg by mouth daily as needed for allergies.     famotidine (PEPCID) 20 MG tablet Take 1 tablet (20 mg total) by mouth every morning. Reported on 04/16/2015 30 tablet 5   fluticasone-salmeterol (ADVAIR) 250-50 MCG/ACT AEPB Inhale 1 puff into the lungs in the morning and at bedtime.     Fluticasone-Umeclidin-Vilant (TRELEGY ELLIPTA) 100-62.5-25 MCG/INH AEPB Inhale 1 puff into the lungs 2 (two) times daily. 60 each 5   furosemide (LASIX) 20 MG tablet Take 1 tablet (20 mg total) by mouth daily as needed for fluid or edema. 30 tablet 3   insulin glargine (LANTUS) 100 UNIT/ML injection INJECT 0.4 MLS (40 UNITS TOTAL) INTO THE SKIN IN THE MORNING. 10 mL 0   Insulin Syringe-Needle U-100 (B-D INS SYR ULTRAFINE 1CC/30G) 30G X 1/2" 1 ML MISC Use 4 times a day with insulin Dx E11.9 400 each 3   Insulin Syringes, Disposable, U-100 1 ML MISC Use 4 times a day for insulin injection Dx E11.9 400 each 5   lisinopril (ZESTRIL) 10 MG tablet Take 1 tablet (10 mg total) by mouth daily. 90 tablet 3   NOVOLOG  100 UNIT/ML injection PER SLIDING SCALE: 190 - 200 = 2 UNITS 300 AND ABOVE = 7 UNITS (Patient taking differently: Inject 0-22 Units into the skin 3 (three) times daily with meals. Sliding scale) 10 mL 0   rivaroxaban (XARELTO) 20 MG TABS tablet Take 1 tablet (20 mg total) by mouth daily with supper. 30 tablet 1   No current facility-administered medications for this encounter.    Allergies  Allergen Reactions   Azithromycin Other (See Comments) and Nausea And Vomiting    "Severe stomach cramps; told to list as an allergy by dr. Huel Cote ago"   Avelox [Moxifloxacin Hcl In Nacl] Other (See Comments)    Hemolysis  In 2012   Bactrim [Sulfamethoxazole-Trimethoprim] Diarrhea and Nausea And Vomiting    Social History   Socioeconomic History   Marital status: Married    Spouse name: Not  on file   Number of children: Not on file   Years of education: Not on file   Highest education level: Not on file  Occupational History   Occupation: Tobacco Farmer  Tobacco Use   Smoking status: Every Day    Packs/day: 2.00    Years: 30.00    Pack years: 60.00    Types: Cigarettes   Smokeless tobacco: Former    Quit date: 06/08/1978   Tobacco comments:    Couple packs per day 12/25/2020  Vaping Use   Vaping Use: Never used  Substance and Sexual Activity   Alcohol use: Not Currently    Comment: 5 quarts per week   Drug use: No   Sexual activity: Not on file  Other Topics Concern   Not on file  Social History Narrative   Lives in [place] with wife and 2 sons.    Social Determinants of Health   Financial Resource Strain: Not on file  Food Insecurity: Not on file  Transportation Needs: Not on file  Physical Activity: Not on file  Stress: Not on file  Social Connections: Not on file  Intimate Partner Violence: Not on file     ROS- All systems are reviewed and negative except as per the HPI above.  Physical Exam: Vitals:   01/08/21 1517  BP: (!) 148/76  Pulse: 92  Weight: 78.1 kg   Height: 5\' 11"  (1.803 m)    GEN- The patient is a well appearing male, alert and oriented x 3 today.   HEENT-head normocephalic, atraumatic, sclera clear, conjunctiva pink, hearing intact, trachea midline. Lungs- Clear to ausculation bilaterally, normal work of breathing Heart- irregular rate and rhythm, no murmurs, rubs or gallops  GI- soft, NT, ND, + BS Extremities- no clubbing, cyanosis, or edema MS- no significant deformity or atrophy Skin- no rash or lesion Psych- euthymic mood, full affect Neuro- strength and sensation are intact   Wt Readings from Last 3 Encounters:  01/08/21 78.1 kg  12/25/20 77.3 kg  12/17/20 81.2 kg    EKG today demonstrates  Vent. rate 92 BPM PR interval * ms QRS duration 78 ms QT/QTcB 350/432 ms  Echo 12/05/20 demonstrated   1. Left ventricular ejection fraction, by estimation, is 30 to 35%. Left  ventricular ejection fraction by 3D volume is 34 %. The left ventricle has moderately decreased function. The left ventricle demonstrates global hypokinesis. Left ventricular diastolic parameters are indeterminate.   2. Right ventricular systolic function is low normal. The right  ventricular size is normal. There is normal pulmonary artery systolic  pressure.   3. Left atrial size was moderately dilated.   4. The mitral valve is abnormal. Trivial mitral valve regurgitation  related to prolapse. No evidence of mitral stenosis.   5. The aortic valve is tricuspid. Aortic valve regurgitation is not  visualized. No aortic stenosis is present.   Comparison(s): A prior study was performed on 08/26/2017. Decrease in LVEF.   Epic records are reviewed at length today  CHA2DS2-VASc Score = 4  The patient's score is based upon: CHF History: 1 HTN History: 1 Diabetes History: 1 Stroke History: 0 Vascular Disease History: 1 Age Score: 0 Gender Score: 0      ASSESSMENT AND PLAN: 1. Persistent Atrial Fibrillation/atrial flutter The patient's CHA2DS2-VASc  score is 4, indicating a 4.8% annual risk of stroke.   Patient has not missed any anticoagulation since his last visit. Will arrange DCCV. Check bmet/cbc Continue carvedilol 3.125 mg BID  Continue Xarelto  20 mg daily  2. Secondary Hypercoagulable State (ICD10:  D68.69) The patient is at significant risk for stroke/thromboembolism based upon his CHA2DS2-VASc Score of 4.  Continue Rivaroxaban (Xarelto).   3. HFrEF EF 30-35% ? Related to afib vs alcohol abuse. Could consider ischemic workup given PAD, DM, and tobacco abuse.   No signs or symptoms of fluid overload.  4. ETOH abuse Cessation advised. He has done well with this and has not had any drinks since leaving the hospital in September.   5. HTN Stable, no changes today.   Follow up with Dr Gardiner Rhyme as scheduled post DCCV.    [facility] [address] [place], [place] [address] [phone] 01/08/2021 3:28 PM

## 2021-01-08 NOTE — Patient Instructions (Signed)
Cardioversion scheduled for Monday, November 14th  - Arrive at the [facility] and go to admitting at [facility] not eat or drink anything after midnight the night prior to your procedure.  - Take all your morning medication (except diabetic medications) with a sip of water prior to arrival.  - You will not be able to drive home after your procedure.  - Do NOT miss any doses of your blood thinner - if you should miss a dose please notify our office immediately.  - If you feel as if you go back into normal rhythm prior to scheduled cardioversion, please notify our office immediately. If your procedure is canceled in the cardioversion suite you will be charged a cancellation fee.  Patients will be asked to: to mask in public and hand hygiene (no longer quarantine) in the 3 days prior to surgery, to report if any COVID-19-like illness or household contacts to COVID-19 to determine need for testing

## 2021-01-08 NOTE — H&P (View-Only) (Signed)
Primary Care Physician: Chevis Pretty, [place] Primary Cardiologist: Dr Gardiner Rhyme (new) Primary Electrophysiologist: none Referring Physician: Darla Lesches NP   Thomas Sandoval. is a 65 y.o. male with a history of alcohol abuse, tobacco abuse, COPD, MVP, perforated gastric ulcer s/p repair 2016, DM, HTN, PAD (bilateral fem-pop occlusion), GERD, systolic CHF, atrial flutter, atrial fibrillation who presents for follow up in the [facility]. The patient was initially diagnosed with atrial flutter in 2012 and afib in the setting of his perforated ulcer. Patient has a CHADS2VASC score of 4, has declined anticoagulation in the past. He was hospitalized 9/26-9/30/22 with acute alcoholic pancreatitis and was also found to be in afib at the time. Echo showed EF 30-35%. He left the hospital AMA. At his visit with his PCP 12/17/20 he was still in afib and was started on diltiazem and Xarelto.   On follow up today, patient report that he has noticed some improvement in his fatigue since his last visit although he is not back to baseline. He denies any missed doses of anticoagulation since his last visit.   Today, he denies symptoms of palpitations, chest pain, orthopnea, PND, lower extremity edema, dizziness, presyncope, syncope, snoring, daytime somnolence, bleeding, or neurologic sequela. The patient is tolerating medications without difficulties and is otherwise without complaint today.    Atrial Fibrillation Risk Factors:  he does not have symptoms or diagnosis of sleep apnea. he does not have a history of rheumatic fever. he does have a history of alcohol use. The patient does not have a history of early familial atrial fibrillation or other arrhythmias.  he has a BMI of Body mass index is 24.02 kg/m.Marland Kitchen Filed Weights   01/08/21 1517  Weight: 78.1 kg     Family History  Problem Relation Age of Onset   Breast cancer Mother    Cancer Mother        Breast    Rheumatic fever Father    Heart disease Father    Heart attack Father        Massive    Diabetes Son      Atrial Fibrillation Management history:  Previous antiarrhythmic drugs: none Previous cardioversions: 08/25/17 Previous ablations: none CHADS2VASC score: 4 Anticoagulation history: Xarelto    Past Medical History:  Diagnosis Date   Arthritis    Bladder cancer ([place])    Chronic back pain    COPD (chronic obstructive pulmonary disease) ([place])    DDD (degenerative disc disease)    Diabetic retinopathy of both eyes ([place])    Essential hypertension    GERD (gastroesophageal reflux disease)    History of atrial flutter 02/2011   Converted to NSR with Cardizem   History of chronic bronchitis    History of hemolytic anemia 02/2011   secondary to Avelox   HOH (hard of hearing)    HOH (hard of hearing)    no eardrum and nerve damage on R, also HOH on L   Mitral valve prolapse    a. 2D Echo 11/27/14: EF 55-60%; images were inadequate for LV wall motion assessment, + mild late systolic mitral valve prolapse involving the anterior leaflet.   PAD (peripheral artery disease) ([place]) 04/2014   Dr Trula Slade; bilateral SFA occlusion, R mid, L distal   PAF (paroxysmal atrial fibrillation) ([place])    a. Dx 11/2014 during admission for perf ulcer.   Perforated ulcer ([place])    a. 11/2014 s/p surgery.   Productive cough    Smokers'  cough ([place])    Type 1 diabetes mellitus ([place]) 1977   Past Surgical History:  Procedure Laterality Date   CYSTOSCOPY WITH URETEROSCOPY Right 08/14/2013   Procedure: CYSTOSCOPY WITH URETEROSCOPY BLADDER BIOPSY ;  Surgeon: Claybon Jabs, MD;  Location: [facility];  Service: Urology;  Laterality: Right;   ESOPHAGOGASTRODUODENOSCOPY N/A 02/06/2013   Procedure: ESOPHAGOGASTRODUODENOSCOPY (EGD);  Surgeon: Lafayette Dragon, MD;  Location: [facility] ENDOSCOPY;  Service: Endoscopy;  Laterality: N/A;   LAPAROSCOPY N/A 11/25/2014   Procedure: LAPAROSCOPIC PRIMARY REPAIR OF  PERFORATED PREPYLORIC ULCER WITH Silvestre Gunner;  Surgeon: Greer Pickerel, MD;  Location: [facility];  Service: General;  Laterality: N/A;   TONSILLECTOMY  as child   TRANSTHORACIC ECHOCARDIOGRAM  02-17-2011   MODERATE LVH/  EF 65%   TRANSURETHRAL RESECTION OF BLADDER TUMOR WITH GYRUS (TURBT-GYRUS) N/A 06/12/2013   Procedure: TRANSURETHRAL RESECTION OF BLADDER TUMOR WITH GYRUS (TURBT-GYRUS);  Surgeon: Claybon Jabs, MD;  Location: [facility];  Service: Urology;  Laterality: N/A;   TYMPANIC MEMBRANE REPAIR  as child    Current Outpatient Medications  Medication Sig Dispense Refill   albuterol (PROVENTIL HFA;VENTOLIN HFA) 108 (90 Base) MCG/ACT inhaler Inhale 2 puffs every 6 (six) hours as needed into the lungs for wheezing or shortness of breath. 8.5 Inhaler 2   carvedilol (COREG) 3.125 MG tablet Take 1 tablet (3.125 mg total) by mouth 2 (two) times daily. 60 tablet 11   cetirizine (ZYRTEC) 10 MG tablet Take 10 mg by mouth daily as needed for allergies.     famotidine (PEPCID) 20 MG tablet Take 1 tablet (20 mg total) by mouth every morning. Reported on 04/16/2015 30 tablet 5   fluticasone-salmeterol (ADVAIR) 250-50 MCG/ACT AEPB Inhale 1 puff into the lungs in the morning and at bedtime.     Fluticasone-Umeclidin-Vilant (TRELEGY ELLIPTA) 100-62.5-25 MCG/INH AEPB Inhale 1 puff into the lungs 2 (two) times daily. 60 each 5   furosemide (LASIX) 20 MG tablet Take 1 tablet (20 mg total) by mouth daily as needed for fluid or edema. 30 tablet 3   insulin glargine (LANTUS) 100 UNIT/ML injection INJECT 0.4 MLS (40 UNITS TOTAL) INTO THE SKIN IN THE MORNING. 10 mL 0   Insulin Syringe-Needle U-100 (B-D INS SYR ULTRAFINE 1CC/30G) 30G X 1/2" 1 ML MISC Use 4 times a day with insulin Dx E11.9 400 each 3   Insulin Syringes, Disposable, U-100 1 ML MISC Use 4 times a day for insulin injection Dx E11.9 400 each 5   lisinopril (ZESTRIL) 10 MG tablet Take 1 tablet (10 mg total) by mouth daily. 90 tablet 3   NOVOLOG  100 UNIT/ML injection PER SLIDING SCALE: 190 - 200 = 2 UNITS 300 AND ABOVE = 7 UNITS (Patient taking differently: Inject 0-22 Units into the skin 3 (three) times daily with meals. Sliding scale) 10 mL 0   rivaroxaban (XARELTO) 20 MG TABS tablet Take 1 tablet (20 mg total) by mouth daily with supper. 30 tablet 1   No current facility-administered medications for this encounter.    Allergies  Allergen Reactions   Azithromycin Other (See Comments) and Nausea And Vomiting    "Severe stomach cramps; told to list as an allergy by dr. Huel Cote ago"   Avelox [Moxifloxacin Hcl In Nacl] Other (See Comments)    Hemolysis  In 2012   Bactrim [Sulfamethoxazole-Trimethoprim] Diarrhea and Nausea And Vomiting    Social History   Socioeconomic History   Marital status: Married    Spouse name: Not  on file   Number of children: Not on file   Years of education: Not on file   Highest education level: Not on file  Occupational History   Occupation: Tobacco Farmer  Tobacco Use   Smoking status: Every Day    Packs/day: 2.00    Years: 30.00    Pack years: 60.00    Types: Cigarettes   Smokeless tobacco: Former    Quit date: 06/08/1978   Tobacco comments:    Couple packs per day 12/25/2020  Vaping Use   Vaping Use: Never used  Substance and Sexual Activity   Alcohol use: Not Currently    Comment: 5 quarts per week   Drug use: No   Sexual activity: Not on file  Other Topics Concern   Not on file  Social History Narrative   Lives in [place] with wife and 2 sons.    Social Determinants of Health   Financial Resource Strain: Not on file  Food Insecurity: Not on file  Transportation Needs: Not on file  Physical Activity: Not on file  Stress: Not on file  Social Connections: Not on file  Intimate Partner Violence: Not on file     ROS- All systems are reviewed and negative except as per the HPI above.  Physical Exam: Vitals:   01/08/21 1517  BP: (!) 148/76  Pulse: 92  Weight: 78.1 kg   Height: 5\' 11"  (1.803 m)    GEN- The patient is a well appearing male, alert and oriented x 3 today.   HEENT-head normocephalic, atraumatic, sclera clear, conjunctiva pink, hearing intact, trachea midline. Lungs- Clear to ausculation bilaterally, normal work of breathing Heart- irregular rate and rhythm, no murmurs, rubs or gallops  GI- soft, NT, ND, + BS Extremities- no clubbing, cyanosis, or edema MS- no significant deformity or atrophy Skin- no rash or lesion Psych- euthymic mood, full affect Neuro- strength and sensation are intact   Wt Readings from Last 3 Encounters:  01/08/21 78.1 kg  12/25/20 77.3 kg  12/17/20 81.2 kg    EKG today demonstrates  Vent. rate 92 BPM PR interval * ms QRS duration 78 ms QT/QTcB 350/432 ms  Echo 12/05/20 demonstrated   1. Left ventricular ejection fraction, by estimation, is 30 to 35%. Left  ventricular ejection fraction by 3D volume is 34 %. The left ventricle has moderately decreased function. The left ventricle demonstrates global hypokinesis. Left ventricular diastolic parameters are indeterminate.   2. Right ventricular systolic function is low normal. The right  ventricular size is normal. There is normal pulmonary artery systolic  pressure.   3. Left atrial size was moderately dilated.   4. The mitral valve is abnormal. Trivial mitral valve regurgitation  related to prolapse. No evidence of mitral stenosis.   5. The aortic valve is tricuspid. Aortic valve regurgitation is not  visualized. No aortic stenosis is present.   Comparison(s): A prior study was performed on 08/26/2017. Decrease in LVEF.   Epic records are reviewed at length today  CHA2DS2-VASc Score = 4  The patient's score is based upon: CHF History: 1 HTN History: 1 Diabetes History: 1 Stroke History: 0 Vascular Disease History: 1 Age Score: 0 Gender Score: 0      ASSESSMENT AND PLAN: 1. Persistent Atrial Fibrillation/atrial flutter The patient's CHA2DS2-VASc  score is 4, indicating a 4.8% annual risk of stroke.   Patient has not missed any anticoagulation since his last visit. Will arrange DCCV. Check bmet/cbc Continue carvedilol 3.125 mg BID  Continue Xarelto  20 mg daily  2. Secondary Hypercoagulable State (ICD10:  D68.69) The patient is at significant risk for stroke/thromboembolism based upon his CHA2DS2-VASc Score of 4.  Continue Rivaroxaban (Xarelto).   3. HFrEF EF 30-35% ? Related to afib vs alcohol abuse. Could consider ischemic workup given PAD, DM, and tobacco abuse.   No signs or symptoms of fluid overload.  4. ETOH abuse Cessation advised. He has done well with this and has not had any drinks since leaving the hospital in September.   5. HTN Stable, no changes today.   Follow up with Dr Gardiner Rhyme as scheduled post DCCV.    [facility] [address] [place], [place] [address] [phone] 01/08/2021 3:28 PM

## 2021-01-09 ENCOUNTER — Encounter (HOSPITAL_COMMUNITY): Payer: Self-pay | Admitting: Internal Medicine

## 2021-01-09 ENCOUNTER — Other Ambulatory Visit: Payer: Self-pay | Admitting: Nurse Practitioner

## 2021-01-09 DIAGNOSIS — E119 Type 2 diabetes mellitus without complications: Secondary | ICD-10-CM

## 2021-01-15 ENCOUNTER — Telehealth: Payer: Self-pay | Admitting: Internal Medicine

## 2021-01-15 NOTE — Telephone Encounter (Signed)
Pt will go ahead and use his Inhaler the morning of his Cardioversion... early since he has trouble breathing without it's use... he will call If he develops any further questions.

## 2021-01-15 NOTE — Telephone Encounter (Signed)
Patient's wife called and wanted to know if its ok for patient to take his Fluticasone-Umeclidin-Vilant (TRELEGY ELLIPTA) 100-62.5-25 MCG/INH AEPB before procedure with Dr. Harrington Challenger

## 2021-01-18 ENCOUNTER — Other Ambulatory Visit: Payer: Self-pay | Admitting: Nurse Practitioner

## 2021-01-18 DIAGNOSIS — E119 Type 2 diabetes mellitus without complications: Secondary | ICD-10-CM

## 2021-01-20 ENCOUNTER — Encounter (HOSPITAL_COMMUNITY): Payer: Self-pay | Admitting: Internal Medicine

## 2021-01-20 ENCOUNTER — Encounter (HOSPITAL_COMMUNITY)
Admission: RE | Disposition: A | Discharge status: Home / Self Care | Payer: Self-pay | Source: Home / Self Care | Attending: Internal Medicine

## 2021-01-20 ENCOUNTER — Ambulatory Visit (HOSPITAL_COMMUNITY): Payer: 59 | Admitting: Anesthesiology

## 2021-01-20 ENCOUNTER — Other Ambulatory Visit: Payer: Self-pay

## 2021-01-20 ENCOUNTER — Ambulatory Visit (HOSPITAL_COMMUNITY)
Admission: RE | Admit: 2021-01-20 | Discharge: 2021-01-20 | Disposition: A | Discharge status: Home / Self Care | Payer: 59 | Attending: Internal Medicine | Admitting: Internal Medicine

## 2021-01-20 DIAGNOSIS — F1010 Alcohol abuse, uncomplicated: Secondary | ICD-10-CM | POA: Diagnosis not present

## 2021-01-20 DIAGNOSIS — I4891 Unspecified atrial fibrillation: Secondary | ICD-10-CM | POA: Diagnosis not present

## 2021-01-20 DIAGNOSIS — I5022 Chronic systolic (congestive) heart failure: Secondary | ICD-10-CM | POA: Diagnosis not present

## 2021-01-20 DIAGNOSIS — Z7901 Long term (current) use of anticoagulants: Secondary | ICD-10-CM | POA: Insufficient documentation

## 2021-01-20 DIAGNOSIS — D6869 Other thrombophilia: Secondary | ICD-10-CM | POA: Insufficient documentation

## 2021-01-20 DIAGNOSIS — I4892 Unspecified atrial flutter: Secondary | ICD-10-CM | POA: Diagnosis not present

## 2021-01-20 DIAGNOSIS — I110 Hypertensive heart disease with heart failure: Secondary | ICD-10-CM | POA: Diagnosis not present

## 2021-01-20 DIAGNOSIS — I4819 Other persistent atrial fibrillation: Secondary | ICD-10-CM | POA: Insufficient documentation

## 2021-01-20 DIAGNOSIS — Z79899 Other long term (current) drug therapy: Secondary | ICD-10-CM | POA: Insufficient documentation

## 2021-01-20 HISTORY — PX: CARDIOVERSION: SHX1299

## 2021-01-20 LAB — GLUCOSE, CAPILLARY: Glucose-Capillary: 181 mg/dL — ABNORMAL HIGH (ref 70–99)

## 2021-01-20 SURGERY — CARDIOVERSION
Anesthesia: General

## 2021-01-20 MED ORDER — LIDOCAINE 2% (20 MG/ML) 5 ML SYRINGE
INTRAMUSCULAR | Status: DC | PRN
Start: 2021-01-20 — End: 2021-01-20
  Administered 2021-01-20: 60 mg via INTRAVENOUS

## 2021-01-20 MED ORDER — PROPOFOL 10 MG/ML IV BOLUS
INTRAVENOUS | Status: DC | PRN
  Administered 2021-01-20: 100 mg via INTRAVENOUS

## 2021-01-20 MED ORDER — SODIUM CHLORIDE 0.9 % IV SOLN: INTRAVENOUS | Status: DC

## 2021-01-20 NOTE — Transfer of Care (Signed)
Immediate Anesthesia Transfer of Care Note  Patient: Thomas Sandoval.  Procedure(s) Performed: CARDIOVERSION  Patient Location: Endoscopy Unit  Anesthesia Type:General  Level of Consciousness: awake, oriented and patient cooperative  Airway & Oxygen Therapy: Patient Spontanous Breathing and Patient connected to nasal cannula oxygen  Post-op Assessment: Report given to RN and Post -op Vital signs reviewed and stable  Post vital signs: Reviewed  Last Vitals:  Vitals Value Taken Time  BP    Temp    Pulse    Resp    SpO2      Last Pain:  Vitals:   01/20/21 0825  TempSrc: Temporal  PainSc: 6          Complications: No notable events documented.

## 2021-01-20 NOTE — Discharge Instructions (Signed)

## 2021-01-20 NOTE — Interval H&P Note (Signed)
History and Physical Interval Note:  01/20/2021 8:01 AM  Thomas Sandoval.  has presented today for surgery, with the diagnosis of AFIB.  The various methods of treatment have been discussed with the patient and family. After consideration of risks, benefits and other options for treatment, the patient has consented to  Procedure(s): CARDIOVERSION (N/A) as a surgical intervention.  The patient's history has been reviewed, patient examined, no change in status, stable for surgery.  I have reviewed the patient's chart and labs.  Questions were answered to the patient's satisfaction.     [name]

## 2021-01-20 NOTE — Anesthesia Procedure Notes (Addendum)
Procedure Name: General with mask airway Date/Time: 01/20/2021 8:40 AM Performed by: Jenne Campus, CRNA Pre-anesthesia Checklist: Patient identified, Emergency Drugs available, Suction available and Patient being monitored Patient Re-evaluated:Patient Re-evaluated prior to induction Oxygen Delivery Method: Ambu bag

## 2021-01-20 NOTE — Anesthesia Preprocedure Evaluation (Addendum)
Anesthesia Evaluation  Patient identified by MRN, date of birth, ID band Patient awake    Reviewed: Allergy & Precautions, H&P , NPO status , Patient's Chart, lab work & pertinent test results  Airway Mallampati: II  TM Distance: >3 FB Neck ROM: full    Dental  (+) Dental Advisory Given   Pulmonary COPD, Current Smoker,    breath sounds clear to auscultation       Cardiovascular hypertension, + Peripheral Vascular Disease  + dysrhythmias Atrial Fibrillation  Rhythm:irregular Rate:Normal     Neuro/Psych    GI/Hepatic PUD, GERD  ,  Endo/Other  diabetes, Insulin Dependent  Renal/GU      Musculoskeletal  (+) Arthritis ,   Abdominal   Peds  Hematology   Anesthesia Other Findings   Reproductive/Obstetrics                            Anesthesia Physical Anesthesia Plan  ASA: 3  Anesthesia Plan: General   Post-op Pain Management:    Induction: Intravenous  PONV Risk Score and Plan: 1 and Propofol infusion and Treatment may vary due to age or medical condition  Airway Management Planned: Nasal Cannula  Additional Equipment:   Intra-op Plan:   Post-operative Plan:   Informed Consent: I have reviewed the patients History and Physical, chart, labs and discussed the procedure including the risks, benefits and alternatives for the proposed anesthesia with the patient or authorized representative who has indicated his/her understanding and acceptance.     Dental advisory given  Plan Discussed with: CRNA and Anesthesiologist  Anesthesia Plan Comments:         Anesthesia Quick Evaluation

## 2021-01-21 ENCOUNTER — Encounter (HOSPITAL_COMMUNITY): Payer: Self-pay | Admitting: Internal Medicine

## 2021-01-21 NOTE — Anesthesia Postprocedure Evaluation (Signed)
Anesthesia Post Note  Patient: Thomas Sandoval.  Procedure(s) Performed: CARDIOVERSION     Patient location during evaluation: Endoscopy Anesthesia Type: General Level of consciousness: awake and alert Pain management: pain level controlled Vital Signs Assessment: post-procedure vital signs reviewed and stable Respiratory status: spontaneous breathing, nonlabored ventilation, respiratory function stable and patient connected to nasal cannula oxygen Cardiovascular status: blood pressure returned to baseline and stable Postop Assessment: no apparent nausea or vomiting Anesthetic complications: no   No notable events documented.  Last Vitals:  Vitals:   01/20/21 0907 01/20/21 0917  BP: (!) 148/74 (!) 152/66  Pulse: 88 87  Resp: (!) 24 13  Temp:    SpO2: 97% 97%    Last Pain:  Vitals:   01/20/21 0917  TempSrc:   PainSc: 0-No pain                 Azlan Hanway S

## 2021-01-22 NOTE — CV Procedure (Signed)
CARDIOVERSION  Indication   Atrial fibrillation  Patient sedated by anesthesia with propofol and lidocaine intravenously    WIth pads in the AP position, patieht cardioverted to SR with 200 J synchronized biphasic energy    Procedure was without complication    12 lead EKG pending   Thomas Carnes MD

## 2021-01-23 NOTE — Progress Notes (Signed)
Cardiology Office Note:    Date:  01/24/2021   ID:  Thomas Sandoval., DOB 1955/06/16, MRN [id number]  PCP:  Thomas Pretty, FNP  Cardiologist:  None  Electrophysiologist:  None   Referring MD: Thomas Sandoval, *   Chief Complaint  Patient presents with   Atrial Fibrillation    History of Present Illness:    Thomas Sandoval. is a 65 y.o. male with a hx of alcohol abuse, tobacco abuse, COPD, perforated gastric ulcer status postrepair in 2016, T2DM, hypertension, PAD, MVP, atrial fibrillation/flutter, systolic heart failure who presents for follow-up.  He was hospitalized September 2022 with acute pancreatitis found to be in A. fib.  Echo at that time showed EF 30 to 35%.  He left AMA.  At follow-up with his PCP 12/17/2020 he was still in A. fib and started on Xarelto and diltiazem.  He was referred to A. fib clinic, seen on 12/25/2020.  He underwent cardioversion on 01/20/2021 with successful restoration of sinus rhythm.  Echocardiogram 08/26/2017 showed EF 55 to 60%, mild mitral valve prolapse.  Echo 12/05/2020 showed EF 30 to 35%, global hypokinesis, and low normal RV function, moderate left atrial enlargement, trivial MR.  He denies any chest pain.  Does report he has shortness of breath when he exerts himself, but his exertion is primarily limited by back pain.  Denies any lower extremity edema or palpitations.  Has not been drinking alcohol.  He continues to smoke 2 packs/day   Past Medical History:  Diagnosis Date   Arthritis    Bladder cancer ([place])    Chronic back pain    COPD (chronic obstructive pulmonary disease) (HCC)    DDD (degenerative disc disease)    Diabetic retinopathy of both eyes (HCC)    Essential hypertension    GERD (gastroesophageal reflux disease)    History of atrial flutter 02/2011   Converted to NSR with Cardizem   History of chronic bronchitis    History of hemolytic anemia 02/2011   secondary to Avelox   HOH (hard of hearing)     HOH (hard of hearing)    no eardrum and nerve damage on R, also HOH on L   Mitral valve prolapse    a. 2D Echo 11/27/14: EF 55-60%; images were inadequate for LV wall motion assessment, + mild late systolic mitral valve prolapse involving the anterior leaflet.   PAD (peripheral artery disease) ([place]) 04/2014   Dr Trula Slade; bilateral SFA occlusion, R mid, L distal   PAF (paroxysmal atrial fibrillation) ([place])    a. Dx 11/2014 during admission for perf ulcer.   Perforated ulcer ([place])    a. 11/2014 s/p surgery.   Productive cough    Smokers' cough ([place])    Type 1 diabetes mellitus ([place]) 1977    Past Surgical History:  Procedure Laterality Date   CARDIOVERSION N/A 01/20/2021   Procedure: CARDIOVERSION;  Surgeon: Fay Records, MD;  Location: [place];  Service: Cardiovascular;  Laterality: N/A;   CYSTOSCOPY WITH URETEROSCOPY Right 08/14/2013   Procedure: CYSTOSCOPY WITH URETEROSCOPY BLADDER BIOPSY ;  Surgeon: Claybon Jabs, MD;  Location: [facility];  Service: Urology;  Laterality: Right;   ESOPHAGOGASTRODUODENOSCOPY N/A 02/06/2013   Procedure: ESOPHAGOGASTRODUODENOSCOPY (EGD);  Surgeon: Lafayette Dragon, MD;  [facility] END[facility]COPY;  Service: Endoscopy;  Laterality: N/A;   LAPAROSCOPY N/A 11/25/2014   Procedure: LAPAROSCOPIC PRIMARY REPAIR OF PERFORATED PREPYLORIC ULCER WITH Silvestre Gunner;  Surgeon: Greer Pickerel, MD;  Location: [place];  Service: General;  Laterality: N/A;   TONSILLECTOMY  as child   TRANSTHORACIC ECHOCARDIOGRAM  02-17-2011   MODERATE LVH/  EF 65%   TRANSURETHRAL RESECTION OF BLADDER TUMOR WITH GYRUS (TURBT-GYRUS) N/A 06/12/2013   Procedure: TRANSURETHRAL RESECTION OF BLADDER TUMOR WITH GYRUS (TURBT-GYRUS);  Surgeon: Claybon Jabs, MD;  Location: [facility];  Service: Urology;  Laterality: N/A;   TYMPANIC MEMBRANE REPAIR  as child    Current Medications: Current Meds  Medication Sig   cetirizine (ZYRTEC) 10 MG tablet Take 10 mg by mouth daily as  needed for allergies.   famotidine (PEPCID) 20 MG tablet Take 1 tablet (20 mg total) by mouth every morning. Reported on 04/16/2015   Fluticasone-Umeclidin-Vilant (TRELEGY ELLIPTA) 100-62.5-25 MCG/INH AEPB Inhale 1 puff into the lungs 2 (two) times daily.   furosemide (LASIX) 20 MG tablet Take 1 tablet (20 mg total) by mouth daily as needed for fluid or edema.   [START ON 03/24/2021] HYDROcodone-acetaminophen (NORCO) 5-325 MG tablet Take 1 tablet by mouth 2 (two) times daily.   [START ON 02/23/2021] HYDROcodone-acetaminophen (NORCO) 5-325 MG tablet Take 1 tablet by mouth in the morning and at bedtime.   HYDROcodone-acetaminophen (NORCO) 5-325 MG tablet Take 1 tablet by mouth 2 (two) times daily.   insulin glargine (LANTUS) 100 UNIT/ML injection INJECT 0.4 MLS (40 UNITS TOTAL) INTO THE SKIN IN THE MORNING.   Insulin Syringe-Needle U-100 (B-D INS SYR ULTRAFINE 1CC/30G) 30G X 1/2" 1 ML MISC Use 4 times a day with insulin Dx E11.9   Insulin Syringes, Disposable, U-100 1 ML MISC Use 4 times a day for insulin injection Dx E11.9   lisinopril (ZESTRIL) 10 MG tablet Take 1 tablet (10 mg total) by mouth daily.   metoprolol succinate (TOPROL-XL) 50 MG 24 hr tablet Take 1 tablet (50 mg total) by mouth daily. Take with or immediately following a meal.   NOVOLOG 100 UNIT/ML injection PER SLIDING SCALE: 190 - 200 = 2 UNITS 300 AND ABOVE = 7 UNITS   OVER THE COUNTER MEDICATION Take 1 Scoop by mouth in the morning and at bedtime. Super Beets   rivaroxaban (XARELTO) 20 MG TABS tablet Take 1 tablet (20 mg total) by mouth daily with supper.   [DISCONTINUED] carvedilol (COREG) 3.125 MG tablet Take 1 tablet (3.125 mg total) by mouth 2 (two) times daily.     Allergies:   Azithromycin, Avelox [moxifloxacin hcl in nacl], and Bactrim [sulfamethoxazole-trimethoprim]   Social History   Socioeconomic History   Marital status: Married    Spouse name: Not on file   Number of children: Not on file   Years of education: Not  on file   Highest education level: Not on file  Occupational History   Occupation: Tobacco Farmer  Tobacco Use   Smoking status: Every Day    Packs/day: 2.00    Years: 30.00    Pack years: 60.00    Types: Cigarettes   Smokeless tobacco: Former    Quit date: 06/08/1978   Tobacco comments:    Couple packs per day 12/25/2020  Vaping Use   Vaping Use: Never used  Substance and Sexual Activity   Alcohol use: Not Currently    Comment: 5 quarts per week   Drug use: No   Sexual activity: Not on file  Other Topics Concern   Not on file  Social History Narrative   Lives in [place] with wife and 2 sons.    Social Determinants of Health   Financial Resource Strain:  Not on file  Food Insecurity: Not on file  Transportation Needs: Not on file  Physical Activity: Not on file  Stress: Not on file  Social Connections: Not on file     Family History: The patient's family history includes Breast cancer in his mother; Cancer in his mother; Diabetes in his son; Heart attack in his father; Heart disease in his father; Rheumatic fever in his father.  ROS:   Please see the history of present illness.     All other systems reviewed and are negative.  EKGs/Labs/Other Studies Reviewed:    The following studies were reviewed today:   EKG:  EKG is  ordered today.  The ekg ordered today demonstrates atrial flutter with variable conduction, rate 102, rate 102, poor R wave progression  Recent Labs: 12/06/2020: Magnesium 1.9 12/17/2020: ALT 12; BNP 200.0 01/08/2021: BUN 10; Creatinine, Ser 1.01; Hemoglobin 14.3; Platelets 246; Potassium 4.6; Sodium 135  Recent Lipid Panel    Component Value Date/Time   CHOL 121 10/25/2020 1442   TRIG 143 10/25/2020 1442   HDL 54 10/25/2020 1442   CHOLHDL 2.2 10/25/2020 1442   LDLCALC 43 10/25/2020 1442    Physical Exam:    VS:  BP 112/64   Pulse (!) 104   Ht 5\' 10"  (1.778 m)   Wt 176 lb 6.4 oz (80 kg)   SpO2 100%   BMI 25.31 kg/m     Wt Readings  from Last 3 Encounters:  01/24/21 176 lb 6.4 oz (80 kg)  01/24/21 169 lb (76.7 kg)  01/20/21 172 lb 2.9 oz (78.1 kg)     GEN:  Well nourished, well developed in no acute distress HEENT: Normal NECK: No JVD; No carotid bruits LYMPHATICS: No lymphadenopathy CARDIAC: Irregular, tachycardic, no murmurs, rubs, gallops RESPIRATORY:  Clear to auscultation without rales, wheezing or rhonchi  ABDOMEN: Soft, non-tender, non-distended MUSCULOSKELETAL:  No edema; No deformity  SKIN: Warm and dry NEUROLOGIC:  Alert and oriented x 3 PSYCHIATRIC:  Normal affect   ASSESSMENT:    1. Persistent atrial fibrillation ([place])   2. Acute combined systolic and diastolic heart failure ([place])   3. Essential hypertension   4. Tobacco use    PLAN:    Persistent atrial fibrillation: CHA2DS2-VASc score 4.  Underwent successful cardioversion on 01/20/2021, but now back in Afib -Continue Xarelto 20 mg daily -Reports has been having difficulty taking carvedilol as he keeps forgetting to take the second dose.  Will switch to Toprol-XL 50 mg daily.  Check Zio patch x3 days to evaluate rate control -Given possible tachycardia induced cardiomyopathy, would recommend trying to obtain rhythm control.  Will schedule with A. fib clinic to consider antiarrhythmic and repeat cardioversion  Acute combined systolic and diastolic heart failure: EF 30 to 35% on echocardiogram during admission with acute pancreatitis in September 2022.  Differential diagnosis includes tachycardia induced from A. fib, alcohol abuse, or ischemia.   -Plan repeat echocardiogram in 3 months once restored sinus rhythm.  If not improved, will plan ischemia evaluation.  Recommend abstaining from alcohol. -On Lasix 20 mg daily as needed.  Has not needed to use.  Appears euvolemic. -Switch to Toprol-XL 50 mg daily as above -Continue lisinopril 10 mg daily  Alcohol abuse: Counseled risk of alcohol abuse and cessation recommended.  He reports no drink since  leaving hospital in September 2022  Tobacco use: Patient counseled on the risk of tobacco use and cessation strongly encouraged  Hypertension: Continue lisinopril and carvedilol  T2DM: On insulin  RTC in 1 month  Medication Adjustments/Labs and Tests Ordered: Current medicines are reviewed at length with the patient today.  Concerns regarding medicines are outlined above.  No orders of the defined types were placed in this encounter.  Meds ordered this encounter  Medications   metoprolol succinate (TOPROL-XL) 50 MG 24 hr tablet    Sig: Take 1 tablet (50 mg total) by mouth daily. Take with or immediately following a meal.    Dispense:  90 tablet    Refill:  3    01/24/21 coreg stopped    Patient Instructions  Medication Instructions:  STOP Coreg   START Toprol XL 50 mg daily    Labwork: None today   Testing/Procedures: ZIO XT- Long Term Monitor Instructions   Your physician has requested you wear your ZIO patch monitor___3____days.   This is a single patch monitor.  [employer] supplies one patch monitor per enrollment.  Additional stickers are not available.     Do not shower for the first 24 hours.  You may shower after the first 24 hours.   Press button if you feel a symptom. You will hear a small click.  Record Date, Time and Symptom in the Patient Log Book.   When you are ready to remove patch, follow instructions on last 2 pages of Patient Log Book.  Stick patch monitor onto last page of Patient Log Book.   Place Patient Log Book in [employer] box.  Use locking tab on box and tape box closed securely.  The [employer] has [employer] on it.  Please place in mailbox as soon as possible.  Your physician should have your test results approximately 7 days after the monitor has been mailed back to [facility]   Call [facility] at [phone] if you have questions regarding your ZIO XT patch monitor.  Call them immediately if you see an  orange light blinking on your monitor.   If your monitor falls off in less than 4 days contact our Monitor department at [phone].  If your monitor becomes loose or falls off after 4 days call [employer] at [phone] for suggestions on securing your monitor.    Follow-Up:  A-fib Clinic in 1-2 weeks    Dr.Yomara Toothman in 1 month   Any Other Special Instructions Will Be Listed Below (If Applicable).   Kardia heart Monitor available on [employer]. Also check [employer]  If you need a refill on your cardiac medications before your next appointment, please call your pharmacy.   Signed, Donato Heinz, MD  01/24/2021 10:18 PM    [place]

## 2021-01-24 ENCOUNTER — Ambulatory Visit (INDEPENDENT_AMBULATORY_CARE_PROVIDER_SITE_OTHER): Payer: 59

## 2021-01-24 ENCOUNTER — Ambulatory Visit: Payer: 59 | Admitting: Nurse Practitioner

## 2021-01-24 ENCOUNTER — Encounter: Payer: Self-pay | Admitting: Cardiology

## 2021-01-24 ENCOUNTER — Ambulatory Visit (INDEPENDENT_AMBULATORY_CARE_PROVIDER_SITE_OTHER): Payer: 59 | Admitting: Cardiology

## 2021-01-24 ENCOUNTER — Encounter: Payer: Self-pay | Admitting: Nurse Practitioner

## 2021-01-24 ENCOUNTER — Other Ambulatory Visit: Payer: Self-pay

## 2021-01-24 ENCOUNTER — Other Ambulatory Visit: Payer: Self-pay | Admitting: Cardiology

## 2021-01-24 VITALS — BP 129/84 | HR 66 | Temp 96.8°F | Resp 20 | Ht 71.0 in | Wt 169.0 lb

## 2021-01-24 VITALS — BP 112/64 | HR 104 | Ht 70.0 in | Wt 176.4 lb

## 2021-01-24 DIAGNOSIS — I10 Essential (primary) hypertension: Secondary | ICD-10-CM | POA: Diagnosis not present

## 2021-01-24 DIAGNOSIS — I4891 Unspecified atrial fibrillation: Secondary | ICD-10-CM

## 2021-01-24 DIAGNOSIS — I5041 Acute combined systolic (congestive) and diastolic (congestive) heart failure: Secondary | ICD-10-CM | POA: Diagnosis not present

## 2021-01-24 DIAGNOSIS — G8929 Other chronic pain: Secondary | ICD-10-CM

## 2021-01-24 DIAGNOSIS — I4819 Other persistent atrial fibrillation: Secondary | ICD-10-CM

## 2021-01-24 DIAGNOSIS — Z720 Tobacco use: Secondary | ICD-10-CM | POA: Diagnosis not present

## 2021-01-24 DIAGNOSIS — M545 Low back pain, unspecified: Secondary | ICD-10-CM | POA: Diagnosis not present

## 2021-01-24 MED ORDER — HYDROCODONE-ACETAMINOPHEN 5-325 MG PO TABS: 1.0000 | ORAL_TABLET | Freq: Two times a day (BID) | ORAL | 0 refills | Status: DC

## 2021-01-24 MED ORDER — METOPROLOL SUCCINATE ER 50 MG PO TB24: 50.0000 mg | ORAL_TABLET | Freq: Every day | ORAL | 3 refills | Status: DC

## 2021-01-24 MED ORDER — HYDROCODONE-ACETAMINOPHEN 5-325 MG PO TABS: 1.0000 | ORAL_TABLET | Freq: Two times a day (BID) | ORAL | 0 refills | Status: AC

## 2021-01-24 NOTE — Progress Notes (Signed)
Subjective:    Patient ID: Thomas Medici., male    DOB: 08/23/1955, 65 y.o.   MRN: [id number]   Chief Complaint: Medical Management of Chronic Issues   HPI Patient has been in and out of hospital the last few weeks. He has had atrialn fib and had to be cardioverted. He also had pancreatits from alcohol consumption. He has not had anything to drink since end of September. He is doing much better. Because he has been in hospital 2 times since last visit and had lots of labs work done we are going to address pain management today. He is much calmer today. Says he is feeling much better. Pain assessment: Cause of pain- back pain Pain location- lower back Pain on scale of 1-10- 7/10 currently Frequency- daily What increases pain-to much activity What makes pain Better-rest has been helping Effects on ADL - none Any change in general medical condition-better   Current opioids rx- norco 5/325 bid # meds rx- 60 Effectiveness of current meds-helps Adverse reactions from pain meds-none Morphine equivalent- 20MME  Pill count performed-No Last drug screen - none ( high risk q83m, moderate risk q54m, low risk yearly ) Urine drug screen today- No Was the [place] reviewed- yes  If yes were their any concerning findings? - none   Overdose risk: 1 Opioid Risk  09/16/2018  Alcohol 0  Illegal Drugs 0  Rx Drugs 0  Alcohol 0  Illegal Drugs 0  Rx Drugs 0  Age between 16-45 years  0  History of Preadolescent Sexual Abuse 0  Psychological Disease 0  Depression 0  Opioid Risk Tool Scoring 0  Opioid Risk Interpretation Low Risk     Pain contract signed on: 10/28/20    Review of Systems  Constitutional:  Negative for diaphoresis.  Eyes:  Negative for pain.  Respiratory:  Negative for shortness of breath.   Cardiovascular:  Negative for chest pain, palpitations and leg swelling.  Gastrointestinal:  Negative for abdominal pain.  Endocrine: Negative for polydipsia.  Skin:  Negative  for rash.  Neurological:  Negative for dizziness, weakness and headaches.  Hematological:  Does not bruise/bleed easily.  All other systems reviewed and are negative.     Objective:   Physical Exam Vitals and nursing note reviewed.  Constitutional:      Appearance: Normal appearance. He is well-developed.  HENT:     Head: Normocephalic.     Nose: Nose normal.  Eyes:     Pupils: Pupils are equal, round, and reactive to light.  Neck:     Thyroid: No thyroid mass or thyromegaly.     Vascular: No carotid bruit or JVD.     Trachea: Phonation normal.  Cardiovascular:     Rate and Rhythm: Normal rate. Rhythm irregular.  Pulmonary:     Effort: Pulmonary effort is normal. No respiratory distress.     Breath sounds: Wheezing (bil bases) present.  Abdominal:     General: Bowel sounds are normal.     Palpations: Abdomen is soft.     Tenderness: There is no abdominal tenderness.  Musculoskeletal:        General: Normal range of motion.     Cervical back: Normal range of motion and neck supple.  Lymphadenopathy:     Cervical: No cervical adenopathy.  Skin:    General: Skin is warm and dry.  Neurological:     Mental Status: He is alert and oriented to person, place, and time.  Psychiatric:  Behavior: Behavior normal.        Thought Content: Thought content normal.        Judgment: Judgment normal.    BP 129/84   Pulse 66   Temp (!) 96.8 F (36 C) (Temporal)   Resp 20   Ht 5\' 11"  (1.803 m)   Wt 169 lb (76.7 kg)   SpO2 90%   BMI 23.57 kg/m        Assessment & Plan:  Thomas Medici. in today with chief complaint of Medical Management of Chronic Issues   1. Chronic midline low back pain without sciatica Moist heat Back brace may help Seat down when need to - HYDROcodone-acetaminophen (NORCO) 5-325 MG tablet; Take 1 tablet by mouth 2 (two) times daily.  Dispense: 60 tablet; Refill: 0 - HYDROcodone-acetaminophen (NORCO) 5-325 MG tablet; Take 1 tablet by mouth  in the morning and at bedtime.  Dispense: 60 tablet; Refill: 0 - HYDROcodone-acetaminophen (NORCO) 5-325 MG tablet; Take 1 tablet by mouth 2 (two) times daily.  Dispense: 60 tablet; Refill: 0  Hospital records reviewed  The above assessment and management plan was discussed with the patient. The patient verbalized understanding of and has agreed to the management plan. Patient is aware to call the clinic if symptoms persist or worsen. Patient is aware when to return to the clinic for a follow-up visit. Patient educated on when it is appropriate to go to the emergency department.   [name] Hassell Done, FNP

## 2021-01-24 NOTE — Patient Instructions (Addendum)
Medication Instructions:  STOP Coreg   START Toprol XL 50 mg daily    Labwork: None today   Testing/Procedures: ZIO XT- Long Term Monitor Instructions   Your physician has requested you wear your ZIO patch monitor___3____days.   This is a single patch monitor.  [employer] supplies one patch monitor per enrollment.  Additional stickers are not available.     Do not shower for the first 24 hours.  You may shower after the first 24 hours.   Press button if you feel a symptom. You will hear a small click.  Record Date, Time and Symptom in the Patient Log Book.   When you are ready to remove patch, follow instructions on last 2 pages of Patient Log Book.  Stick patch monitor onto last page of Patient Log Book.   Place Patient Log Book in [place] box.  Use locking tab on box and tape box closed securely.  The [employer] has [employer] on it.  Please place in mailbox as soon as possible.  Your physician should have your test results approximately 7 days after the monitor has been mailed back to [facility].   Call [facility] at [phone] if you have questions regarding your ZIO XT patch monitor.  Call them immediately if you see an orange light blinking on your monitor.   If your monitor falls off in less than 4 days contact our Monitor department at [phone].  If your monitor becomes loose or falls off after 4 days call [employer] at [phone] for suggestions on securing your monitor.    Follow-Up:  A-fib Clinic in 1-2 weeks    Dr.Schumann in 1 month   Any Other Special Instructions Will Be Listed Below (If Applicable).   Kardia heart Monitor available on [employer]. Also check [employer]  If you need a refill on your cardiac medications before your next appointment, please call your pharmacy.

## 2021-01-27 NOTE — Addendum Note (Signed)
Addended by: Barbarann Ehlers A on: 01/27/2021 09:43 AM   Modules accepted: Orders

## 2021-02-04 ENCOUNTER — Telehealth: Payer: Self-pay | Admitting: Nurse Practitioner

## 2021-02-04 MED ORDER — DOXYCYCLINE HYCLATE 100 MG PO TABS: 100.0000 mg | ORAL_TABLET | Freq: Two times a day (BID) | ORAL | 0 refills | Status: DC

## 2021-02-04 NOTE — Telephone Encounter (Signed)
Doxycycline sent to pharmacy

## 2021-02-04 NOTE — Telephone Encounter (Signed)
Patient aware.

## 2021-02-06 ENCOUNTER — Encounter (HOSPITAL_COMMUNITY): Payer: Self-pay | Admitting: Physician Assistant

## 2021-02-06 ENCOUNTER — Ambulatory Visit (HOSPITAL_COMMUNITY)
Admission: RE | Admit: 2021-02-06 | Discharge: 2021-02-06 | Disposition: A | Discharge status: Home / Self Care | Payer: 59 | Source: Ambulatory Visit | Attending: Physician Assistant | Admitting: Physician Assistant

## 2021-02-06 VITALS — BP 144/86 | HR 83 | Ht 70.0 in | Wt 169.8 lb

## 2021-02-06 DIAGNOSIS — I5022 Chronic systolic (congestive) heart failure: Secondary | ICD-10-CM | POA: Insufficient documentation

## 2021-02-06 DIAGNOSIS — Z7901 Long term (current) use of anticoagulants: Secondary | ICD-10-CM | POA: Diagnosis not present

## 2021-02-06 DIAGNOSIS — I110 Hypertensive heart disease with heart failure: Secondary | ICD-10-CM | POA: Insufficient documentation

## 2021-02-06 DIAGNOSIS — E119 Type 2 diabetes mellitus without complications: Secondary | ICD-10-CM | POA: Diagnosis not present

## 2021-02-06 DIAGNOSIS — I341 Nonrheumatic mitral (valve) prolapse: Secondary | ICD-10-CM | POA: Diagnosis not present

## 2021-02-06 DIAGNOSIS — D6869 Other thrombophilia: Secondary | ICD-10-CM | POA: Diagnosis not present

## 2021-02-06 DIAGNOSIS — I4819 Other persistent atrial fibrillation: Secondary | ICD-10-CM | POA: Diagnosis not present

## 2021-02-06 DIAGNOSIS — J449 Chronic obstructive pulmonary disease, unspecified: Secondary | ICD-10-CM | POA: Diagnosis not present

## 2021-02-06 DIAGNOSIS — K219 Gastro-esophageal reflux disease without esophagitis: Secondary | ICD-10-CM | POA: Insufficient documentation

## 2021-02-06 DIAGNOSIS — I739 Peripheral vascular disease, unspecified: Secondary | ICD-10-CM | POA: Diagnosis not present

## 2021-02-06 DIAGNOSIS — Z79899 Other long term (current) drug therapy: Secondary | ICD-10-CM | POA: Diagnosis not present

## 2021-02-06 NOTE — Progress Notes (Signed)
Primary Care Physician: Chevis Pretty, [place] Primary Cardiologist: Dr Gardiner Rhyme Primary Electrophysiologist: none Referring Physician: Darla Lesches NP   [name] is a 65 y.o. male with a history of alcohol abuse, tobacco abuse, COPD, MVP, perforated gastric ulcer s/p repair 2016, DM, HTN, PAD (bilateral fem-pop occlusion), GERD, systolic CHF, atrial flutter, atrial fibrillation who presents for follow up in the [facility]. The patient was initially diagnosed with atrial flutter in 2012 and afib in the setting of his perforated ulcer. Patient has a CHADS2VASC score of 4, has declined anticoagulation in the past. He was hospitalized 9/26-9/30/22 with acute alcoholic pancreatitis and was also found to be in afib at the time. Echo showed EF 30-35%. He left the hospital AMA. At his visit with his PCP 12/17/20 he was still in afib and was started on diltiazem and Xarelto.   On follow up today, patient is s/p DCCV on 01/20/21 but unfortunately was back in afib by follow up on 01/24/21. He did not feel any different in SR. He remains fatigued. No bleeding issues on anticoagulation.   Today, he denies symptoms of palpitations, chest pain, orthopnea, PND, lower extremity edema, dizziness, presyncope, syncope, snoring, daytime somnolence, bleeding, or neurologic sequela. The patient is tolerating medications without difficulties and is otherwise without complaint today.    Atrial Fibrillation Risk Factors:  he does not have symptoms or diagnosis of sleep apnea. he does not have a history of rheumatic fever. he does have a history of alcohol use. The patient does not have a history of early familial atrial fibrillation or other arrhythmias.  he has a BMI of Body mass index is 24.36 kg/m.Marland Kitchen Filed Weights   02/06/21 1443  Weight: 77 kg     Family History  Problem Relation Age of Onset   Breast cancer Mother    Cancer Mother        Breast    Rheumatic fever Father    Heart disease Father    Heart attack Father        Massive    Diabetes Son      Atrial Fibrillation Management history:  Previous antiarrhythmic drugs: none Previous cardioversions: 08/25/17, 01/20/21 Previous ablations: none CHADS2VASC score: 4 Anticoagulation history: Xarelto    Past Medical History:  Diagnosis Date   Arthritis    Bladder cancer ([place])    Chronic back pain    COPD (chronic obstructive pulmonary disease) ([place])    DDD (degenerative disc disease)    Diabetic retinopathy of both eyes ([place])    Essential hypertension    GERD (gastroesophageal reflux disease)    History of atrial flutter 02/2011   Converted to NSR with Cardizem   History of chronic bronchitis    History of hemolytic anemia 02/2011   secondary to Avelox   HOH (hard of hearing)    HOH (hard of hearing)    no eardrum and nerve damage on R, also HOH on L   Mitral valve prolapse    a. 2D Echo 11/27/14: EF 55-60%; images were inadequate for LV wall motion assessment, + mild late systolic mitral valve prolapse involving the anterior leaflet.   PAD (peripheral artery disease) ([place]) 04/2014   Dr Trula Slade; bilateral SFA occlusion, R mid, L distal   PAF (paroxysmal atrial fibrillation) ([place])    a. Dx 11/2014 during admission for perf ulcer.   Perforated ulcer ([place])    a. 11/2014 s/p surgery.   Productive cough    Smokers'  cough ([place])    Type 1 diabetes mellitus ([place]) 1977   Past Surgical History:  Procedure Laterality Date   CARDIOVERSION N/A 01/20/2021   Procedure: CARDIOVERSION;  Surgeon: Fay Records, MD;  Location: [facility];  Service: Cardiovascular;  Laterality: N/A;   CYSTOSCOPY WITH URETEROSCOPY Right 08/14/2013   Procedure: CYSTOSCOPY WITH URETEROSCOPY BLADDER BIOPSY ;  Surgeon: Claybon Jabs, MD;  Location: [facility];  Service: Urology;  Laterality: Right;   ESOPHAGOGASTRODUODENOSCOPY N/A 02/06/2013   Procedure: ESOPHAGOGASTRODUODENOSCOPY (EGD);   Surgeon: Lafayette Dragon, MD;  Location: [facility] ENDOSCOPY;  Service: Endoscopy;  Laterality: N/A;   LAPAROSCOPY N/A 11/25/2014   Procedure: LAPAROSCOPIC PRIMARY REPAIR OF PERFORATED PREPYLORIC ULCER WITH Silvestre Gunner;  Surgeon: Greer Pickerel, MD;  Location: [facility];  Service: General;  Laterality: N/A;   TONSILLECTOMY  as child   TRANSTHORACIC ECHOCARDIOGRAM  02-17-2011   MODERATE LVH/  EF 65%   TRANSURETHRAL RESECTION OF BLADDER TUMOR WITH GYRUS (TURBT-GYRUS) N/A 06/12/2013   Procedure: TRANSURETHRAL RESECTION OF BLADDER TUMOR WITH GYRUS (TURBT-GYRUS);  Surgeon: Claybon Jabs, MD;  Location: [facility];  Service: Urology;  Laterality: N/A;   TYMPANIC MEMBRANE REPAIR  as child    Current Outpatient Medications  Medication Sig Dispense Refill   albuterol (PROVENTIL HFA;VENTOLIN HFA) 108 (90 Base) MCG/ACT inhaler Inhale 2 puffs every 6 (six) hours as needed into the lungs for wheezing or shortness of breath. 8.5 Inhaler 2   cetirizine (ZYRTEC) 10 MG tablet Take 10 mg by mouth daily as needed for allergies.     doxycycline (VIBRA-TABS) 100 MG tablet Take 1 tablet (100 mg total) by mouth 2 (two) times daily. 1 po bid 20 tablet 0   famotidine (PEPCID) 20 MG tablet Take 1 tablet (20 mg total) by mouth every morning. Reported on 04/16/2015 30 tablet 5   Fluticasone-Umeclidin-Vilant (TRELEGY ELLIPTA) 100-62.5-25 MCG/INH AEPB Inhale 1 puff into the lungs 2 (two) times daily. 60 each 5   furosemide (LASIX) 20 MG tablet Take 1 tablet (20 mg total) by mouth daily as needed for fluid or edema. 30 tablet 3   [START ON 03/24/2021] HYDROcodone-acetaminophen (NORCO) 5-325 MG tablet Take 1 tablet by mouth 2 (two) times daily. 60 tablet 0   [START ON 02/23/2021] HYDROcodone-acetaminophen (NORCO) 5-325 MG tablet Take 1 tablet by mouth in the morning and at bedtime. 60 tablet 0   HYDROcodone-acetaminophen (NORCO) 5-325 MG tablet Take 1 tablet by mouth 2 (two) times daily. 60 tablet 0   insulin glargine (LANTUS)  100 UNIT/ML injection INJECT 0.4 MLS (40 UNITS TOTAL) INTO THE SKIN IN THE MORNING. 10 mL 0   Insulin Syringe-Needle U-100 (B-D INS SYR ULTRAFINE 1CC/30G) 30G X 1/2" 1 ML MISC Use 4 times a day with insulin Dx E11.9 400 each 3   Insulin Syringes, Disposable, U-100 1 ML MISC Use 4 times a day for insulin injection Dx E11.9 400 each 5   lisinopril (ZESTRIL) 10 MG tablet Take 1 tablet (10 mg total) by mouth daily. 90 tablet 3   metoprolol succinate (TOPROL-XL) 50 MG 24 hr tablet Take 1 tablet (50 mg total) by mouth daily. Take with or immediately following a meal. 90 tablet 3   NOVOLOG 100 UNIT/ML injection PER SLIDING SCALE: 190 - 200 = 2 UNITS 300 AND ABOVE = 7 UNITS 10 mL 0   OVER THE COUNTER MEDICATION Take 1 Scoop by mouth in the morning and at bedtime. Super Beets     rivaroxaban (XARELTO) 20  MG TABS tablet Take 1 tablet (20 mg total) by mouth daily with supper. 30 tablet 1   No current facility-administered medications for this encounter.    Allergies  Allergen Reactions   Azithromycin Other (See Comments) and Nausea And Vomiting    "Severe stomach cramps; told to list as an allergy by dr. Huel Cote ago"   Avelox [Moxifloxacin Hcl In Nacl] Other (See Comments)    Hemolysis  In 2012   Bactrim [Sulfamethoxazole-Trimethoprim] Diarrhea and Nausea And Vomiting    Social History   Socioeconomic History   Marital status: Married    Spouse name: Not on file   Number of children: Not on file   Years of education: Not on file   Highest education level: Not on file  Occupational History   Occupation: Tobacco Farmer  Tobacco Use   Smoking status: Every Day    Packs/day: 2.00    Years: 30.00    Pack years: 60.00    Types: Cigarettes   Smokeless tobacco: Former    Quit date: 06/08/1978   Tobacco comments:    Couple packs per day 12/25/2020  Vaping Use   Vaping Use: Never used  Substance and Sexual Activity   Alcohol use: Not Currently    Comment: 5 quarts per week   Drug use: No    Sexual activity: Not on file  Other Topics Concern   Not on file  Social History Narrative   Lives in [place] with wife and 2 sons.    Social Determinants of Health   Financial Resource Strain: Not on file  Food Insecurity: Not on file  Transportation Needs: Not on file  Physical Activity: Not on file  Stress: Not on file  Social Connections: Not on file  Intimate Partner Violence: Not on file     ROS- All systems are reviewed and negative except as per the HPI above.  Physical Exam: Vitals:   02/06/21 1443  BP: (!) 144/86  Pulse: 83  Weight: 77 kg  Height: 5\' 10"  (1.778 m)   GEN- The patient is a well appearing male, alert and oriented x 3 today.   HEENT-head normocephalic, atraumatic, sclera clear, conjunctiva pink, hearing intact, trachea midline. Lungs- Clear to ausculation bilaterally, normal work of breathing Heart- irregular rate and rhythm, no murmurs, rubs or gallops  GI- soft, NT, ND, + BS Extremities- no clubbing, cyanosis, or edema MS- no significant deformity or atrophy Skin- no rash or lesion Psych- euthymic mood, full affect Neuro- strength and sensation are intact   Wt Readings from Last 3 Encounters:  02/06/21 77 kg  01/24/21 80 kg  01/24/21 76.7 kg    EKG today demonstrates  Afib Vent. rate 83 BPM PR interval * ms QRS duration 80 ms QT/QTcB 378/444 ms  Echo 12/05/20 demonstrated   1. Left ventricular ejection fraction, by estimation, is 30 to 35%. Left  ventricular ejection fraction by 3D volume is 34 %. The left ventricle has moderately decreased function. The left ventricle demonstrates global hypokinesis. Left ventricular diastolic parameters are indeterminate.   2. Right ventricular systolic function is low normal. The right  ventricular size is normal. There is normal pulmonary artery systolic  pressure.   3. Left atrial size was moderately dilated.   4. The mitral valve is abnormal. Trivial mitral valve regurgitation  related to  prolapse. No evidence of mitral stenosis.   5. The aortic valve is tricuspid. Aortic valve regurgitation is not  visualized. No aortic stenosis is present.  Comparison(s): A prior study was performed on 08/26/2017. Decrease in LVEF.   Epic records are reviewed at length today  CHA2DS2-VASc Score = 4  The patient's score is based upon: CHF History: 1 HTN History: 1 Diabetes History: 1 Stroke History: 0 Vascular Disease History: 1 Age Score: 0 Gender Score: 0      ASSESSMENT AND PLAN: 1. Persistent Atrial Fibrillation/atrial flutter The patient's CHA2DS2-VASc score is 4, indicating a 4.8% annual risk of stroke.   S/p DCCV on 01/20/21 with early return of afib. We discussed rhythm control options today including AAD vs ablation. Would avoid class IC and Multaq given reduced EF. He is young for amiodarone. We discussed dofetilide today as an option. He may also be a good candidate for ablation given cardiomyopathy and normal LA size. Patient agreeable to consultation with EP.  Continue carvedilol 3.125 mg BID  Continue Xarelto 20 mg daily  2. Secondary Hypercoagulable State (ICD10:  D68.69) The patient is at significant risk for stroke/thromboembolism based upon his CHA2DS2-VASc Score of 4.  Continue Rivaroxaban (Xarelto).   3. HFrEF EF 30-35% ? Related to afib vs alcohol abuse. Could consider ischemic workup if EF does not improve with SR. No symptoms of fluid overload today.  4. HTN Stable, no changes today.   Follow up with EP to discuss ablation vs dofetilide.    [facility] [address] [place], [place] [address] [phone] 02/06/2021 2:57 PM

## 2021-02-11 ENCOUNTER — Other Ambulatory Visit: Payer: Self-pay | Admitting: Nurse Practitioner

## 2021-02-11 DIAGNOSIS — E119 Type 2 diabetes mellitus without complications: Secondary | ICD-10-CM

## 2021-02-14 ENCOUNTER — Other Ambulatory Visit: Payer: Self-pay | Admitting: Nurse Practitioner

## 2021-02-14 DIAGNOSIS — Z794 Long term (current) use of insulin: Secondary | ICD-10-CM

## 2021-02-14 DIAGNOSIS — E119 Type 2 diabetes mellitus without complications: Secondary | ICD-10-CM

## 2021-02-27 ENCOUNTER — Telehealth: Payer: Self-pay | Admitting: *Deleted

## 2021-02-27 NOTE — Telephone Encounter (Signed)
Pt returning phone call... please advise  

## 2021-02-28 MED ORDER — METOPROLOL SUCCINATE ER 50 MG PO TB24: 75.0000 mg | ORAL_TABLET | Freq: Every day | ORAL | 3 refills | Status: DC

## 2021-02-28 NOTE — Telephone Encounter (Signed)
Spoke with pt, aware of monitor results and medication change. New script sent to the pharmacy

## 2021-03-05 ENCOUNTER — Other Ambulatory Visit: Payer: Self-pay | Admitting: Family Medicine

## 2021-03-05 DIAGNOSIS — I4891 Unspecified atrial fibrillation: Secondary | ICD-10-CM

## 2021-03-05 DIAGNOSIS — Z09 Encounter for follow-up examination after completed treatment for conditions other than malignant neoplasm: Secondary | ICD-10-CM

## 2021-03-12 ENCOUNTER — Telehealth: Payer: Self-pay | Admitting: Nurse Practitioner

## 2021-03-12 NOTE — Progress Notes (Signed)
Cardiology Office Note:    Date:  03/14/2021   ID:  Thomas Medici., DOB June 18, 1955, MRN [id number]  PCP:  Chevis Pretty, FNP  Cardiologist:  None  Electrophysiologist:  None   Referring MD: Chevis Pretty, *   Chief Complaint  Patient presents with   Congestive Heart Failure    History of Present Illness:    Thomas Lalla. is a 66 y.o. male with a hx of alcohol abuse, tobacco abuse, COPD, perforated gastric ulcer status postrepair in 2016, T2DM, hypertension, PAD, MVP, atrial fibrillation/flutter, systolic heart failure who presents for follow-up.  He was hospitalized September 2022 with acute pancreatitis found to be in A. fib.  Echo at that time showed EF 30 to 35%.  He left AMA.  At follow-up with Thomas Sandoval PCP 12/17/2020 he was still in A. fib and started on Xarelto and diltiazem.  He was referred to A. fib clinic, seen on 12/25/2020.  He underwent cardioversion on 01/20/2021 with successful restoration of sinus rhythm.  Echocardiogram 08/26/2017 showed EF 55 to 60%, mild mitral valve prolapse.  Echo 12/05/2020 showed EF 30 to 35%, global hypokinesis, and low normal RV function, moderate left atrial enlargement, trivial MR.  Zio patch x3 days on 02/10/2021 showed high percent A. fib burden with average rate 95 bpm.   Since last clinic visit, he reports that he has been doing okay.  Has continued to have shortness of breath.  Denies any chest pain.  Reports occasional lightheadedness, denies any syncope.  Denies any palpitations.  Continues to smoke 2 packs/day.  He is taking Xarelto, denies any bleeding issues.   Past Medical History:  Diagnosis Date   Arthritis    Bladder cancer ([place])    Chronic back pain    COPD (chronic obstructive pulmonary disease) (HCC)    DDD (degenerative disc disease)    Diabetic retinopathy of both eyes (HCC)    Essential hypertension    GERD (gastroesophageal reflux disease)    History of atrial flutter 02/2011   Converted to NSR  with Cardizem   History of chronic bronchitis    History of hemolytic anemia 02/2011   secondary to Avelox   HOH (hard of hearing)    HOH (hard of hearing)    no eardrum and nerve damage on R, also HOH on L   Mitral valve prolapse    a. 2D Echo 11/27/14: EF 55-60%; images were inadequate for LV wall motion assessment, + mild late systolic mitral valve prolapse involving the anterior leaflet.   PAD (peripheral artery disease) ([place]) 04/2014   Dr Trula Slade; bilateral SFA occlusion, R mid, L distal   PAF (paroxysmal atrial fibrillation) ([place])    a. Dx 11/2014 during admission for perf ulcer.   Perforated ulcer ([place])    a. 11/2014 s/p surgery.   Productive cough    Smokers' cough ([place])    Type 1 diabetes mellitus ([place]) 1977    Past Surgical History:  Procedure Laterality Date   CARDIOVERSION N/A 01/20/2021   Procedure: CARDIOVERSION;  Surgeon: Fay Records, MD;  Location: [place];  Service: Cardiovascular;  Laterality: N/A;   CYSTOSCOPY WITH URETEROSCOPY Right 08/14/2013   Procedure: CYSTOSCOPY WITH URETEROSCOPY BLADDER BIOPSY ;  Surgeon: Claybon Jabs, MD;  Location: [facility];  Service: Urology;  Laterality: Right;   ESOPHAGOGASTRODUODENOSCOPY N/A 02/06/2013   Procedure: ESOPHAGOGASTRODUODENOSCOPY (EGD);  Surgeon: Lafayette Dragon, MD;  Location: [facility] ENDOSCOPY;  Service: Endoscopy;  Laterality: N/A;   LAPAROSCOPY N/A  11/25/2014   Procedure: LAPAROSCOPIC PRIMARY REPAIR OF PERFORATED PREPYLORIC ULCER WITH Silvestre Gunner;  Surgeon: Greer Pickerel, MD;  Location: [place];  Service: General;  Laterality: N/A;   TONSILLECTOMY  as child   TRANSTHORACIC ECHOCARDIOGRAM  02-17-2011   MODERATE LVH/  EF 65%   TRANSURETHRAL RESECTION OF BLADDER TUMOR WITH GYRUS (TURBT-GYRUS) N/A 06/12/2013   Procedure: TRANSURETHRAL RESECTION OF BLADDER TUMOR WITH GYRUS (TURBT-GYRUS);  Surgeon: Claybon Jabs, MD;  Location: [facility];  Service: Urology;  Laterality: N/A;   TYMPANIC MEMBRANE  REPAIR  as child    Current Medications: Current Meds  Medication Sig   albuterol (PROVENTIL HFA;VENTOLIN HFA) 108 (90 Base) MCG/ACT inhaler Inhale 2 puffs every 6 (six) hours as needed into the lungs for wheezing or shortness of breath.   cetirizine (ZYRTEC) 10 MG tablet Take 10 mg by mouth daily as needed for allergies.   doxycycline (VIBRA-TABS) 100 MG tablet Take 1 tablet (100 mg total) by mouth 2 (two) times daily. 1 po bid   empagliflozin (JARDIANCE) 10 MG TABS tablet Take 1 tablet (10 mg total) by mouth daily before breakfast.   famotidine (PEPCID) 20 MG tablet Take 1 tablet (20 mg total) by mouth every morning. Reported on 04/16/2015   Fluticasone-Umeclidin-Vilant (TRELEGY ELLIPTA) 100-62.5-25 MCG/INH AEPB Inhale 1 puff into the lungs 2 (two) times daily.   furosemide (LASIX) 20 MG tablet Take 1 tablet (20 mg total) by mouth daily as needed for fluid or edema.   [START ON 03/24/2021] HYDROcodone-acetaminophen (NORCO) 5-325 MG tablet Take 1 tablet by mouth 2 (two) times daily.   HYDROcodone-acetaminophen (NORCO) 5-325 MG tablet Take 1 tablet by mouth in the morning and at bedtime.   insulin glargine (LANTUS) 100 UNIT/ML injection Inject 0.4 mLs (40 Units total) into the skin in the morning.   Insulin Syringe-Needle U-100 (B-D INS SYR ULTRAFINE 1CC/30G) 30G X 1/2" 1 ML MISC Use 4 times a day with insulin Dx E11.9   Insulin Syringes, Disposable, U-100 1 ML MISC Use 4 times a day for insulin injection Dx E11.9   losartan (COZAAR) 50 MG tablet Take 1 tablet (50 mg total) by mouth daily.   metoprolol succinate (TOPROL-XL) 50 MG 24 hr tablet Take 1.5 tablets (75 mg total) by mouth daily. Take with or immediately following a meal.   NOVOLOG 100 UNIT/ML injection PER SLIDING SCALE: 190 - 200 = 2 UNITS 300 AND ABOVE = 7 UNITS   OVER THE COUNTER MEDICATION Take 1 Scoop by mouth in the morning and at bedtime. Super Beets   XARELTO 20 MG TABS tablet TAKE 1 TABLET BY MOUTH DAILY WITH SUPPER.    [DISCONTINUED] lisinopril (ZESTRIL) 10 MG tablet Take 1 tablet (10 mg total) by mouth daily.     Allergies:   Azithromycin, Avelox [moxifloxacin hcl in nacl], and Bactrim [sulfamethoxazole-trimethoprim]   Social History   Socioeconomic History   Marital status: Married    Spouse name: Not on file   Number of children: Not on file   Years of education: Not on file   Highest education level: Not on file  Occupational History   Occupation: Tobacco Farmer  Tobacco Use   Smoking status: Every Day    Packs/day: 2.00    Years: 30.00    Pack years: 60.00    Types: Cigarettes   Smokeless tobacco: Former    Quit date: 06/08/1978   Tobacco comments:    Couple packs per day 12/25/2020  Vaping Use   Vaping Use:  Never used  Substance and Sexual Activity   Alcohol use: Not Currently    Comment: 5 quarts per week   Drug use: No   Sexual activity: Not on file  Other Topics Concern   Not on file  Social History Narrative   Lives in [place] with wife and 2 sons.    Social Determinants of Health   Financial Resource Strain: Not on file  Food Insecurity: Not on file  Transportation Needs: Not on file  Physical Activity: Not on file  Stress: Not on file  Social Connections: Not on file     Family History: The patient's family history includes Breast cancer in Thomas Sandoval mother; Cancer in Thomas Sandoval mother; Diabetes in Thomas Sandoval son; Heart attack in Thomas Sandoval father; Heart disease in Thomas Sandoval father; Rheumatic fever in Thomas Sandoval father.  ROS:   Please see the history of present illness.     All other systems reviewed and are negative.  EKGs/Labs/Other Studies Reviewed:    The following studies were reviewed today:   EKG:   03/14/21:NSR, ST depression<1 mm in inferior leads and V4-6, rate 61   Recent Labs: 12/06/2020: Magnesium 1.9 12/17/2020: ALT 12; BNP 200.0 01/08/2021: BUN 10; Creatinine, Ser 1.01; Hemoglobin 14.3; Platelets 246; Potassium 4.6; Sodium 135  Recent Lipid Panel    Component Value Date/Time    CHOL 121 10/25/2020 1442   TRIG 143 10/25/2020 1442   HDL 54 10/25/2020 1442   CHOLHDL 2.2 10/25/2020 1442   LDLCALC 43 10/25/2020 1442    Physical Exam:    VS:  BP (!) 116/58    Pulse 66    Ht 5\' 9"  (1.753 m)    Wt 165 lb (74.8 kg)    SpO2 97%    BMI 24.37 kg/m     Wt Readings from Last 3 Encounters:  03/14/21 165 lb (74.8 kg)  02/06/21 169 lb 12.8 oz (77 kg)  01/24/21 176 lb 6.4 oz (80 kg)     GEN:  Well nourished, well developed in no acute distress HEENT: Normal NECK: No JVD; No carotid bruits LYMPHATICS: No lymphadenopathy CARDIAC: Irregular, tachycardic, no murmurs, rubs, gallops RESPIRATORY:  Clear to auscultation without rales, wheezing or rhonchi  ABDOMEN: Soft, non-tender, non-distended MUSCULOSKELETAL:  No edema; No deformity  SKIN: Warm and dry NEUROLOGIC:  Alert and oriented x 3 PSYCHIATRIC:  Normal affect   ASSESSMENT:    1. Persistent atrial fibrillation ([place])   2. Acute combined systolic and diastolic heart failure ([place])   3. Tobacco use   4. Essential hypertension     PLAN:    Persistent atrial fibrillation: CHA2DS2-VASc score 4.  Underwent successful cardioversion on 01/20/2021, but was back in A. fib at follow-up appointment on 01/24/2021.  Zio patch x3 days on 02/10/2021 showed 100% percent A. fib burden with average rate 95 bpm.  He is back in sinus rhythm today. -Continue Xarelto 20 mg daily -Continue Toprol-XL 75 mg daily -Given possible tachycardia induced cardiomyopathy, would recommend rhythm control strategy.  Referred to EP to consider ablation versus antiarrhythmic, has appointment with Dr. Quentin Ore on 03/26/2021  Acute combined systolic and diastolic heart failure: EF 30 to 35% on echocardiogram during admission with acute pancreatitis in September 2022.  Differential diagnosis includes tachycardia induced from A. fib, alcohol abuse, or ischemia.   -Plan repeat echocardiogram in 3 months if continues to maintain sinus rhythm.  If not improved,  will plan ischemia evaluation.  Recommend abstaining from alcohol. -On Lasix 20 mg daily as needed.  Has  not needed to use.  Appears euvolemic. -Continue Toprol-XL 75 mg daily -He is having issues with persisting cough, will switch from lisinopril to losartan 50 mg daily -Add Jardiance 10 mg daily.  Check BMET in 1 week  Alcohol abuse: Counseled risk of alcohol abuse and cessation recommended.  He reports no alcohol since admission September 2022  Tobacco use: Patient counseled on the risk of tobacco use and cessation strongly encouraged  Hypertension: Continue Toprol-XL, will switch lisinopril to losartan as above  T2DM: On insulin  RTC in 75-months   Medication Adjustments/Labs and Tests Ordered: Current medicines are reviewed at length with the patient today.  Concerns regarding medicines are outlined above.  Orders Placed This Encounter  Procedures   Basic metabolic panel   Magnesium   EKG 12-Lead    Meds ordered this encounter  Medications   losartan (COZAAR) 50 MG tablet    Sig: Take 1 tablet (50 mg total) by mouth daily.    Dispense:  90 tablet    Refill:  3   empagliflozin (JARDIANCE) 10 MG TABS tablet    Sig: Take 1 tablet (10 mg total) by mouth daily before breakfast.    Dispense:  30 tablet    Refill:  6    Patient Instructions  Medication Instructions:  Your physician has recommended you make the following change in your medication:  STOP Lisinopril START Losartan 50 mg tablets daily START Jardiance 10 mg tablets daily  *If you need a refill on your cardiac medications before your next appointment, please call your pharmacy*   Lab Work: IN ONE WEEK: BMET MAG If you have labs (blood work) drawn today and your tests are completely normal, you will receive your results only by: [place] (if you have MyChart) OR A paper copy in the mail If you have any lab test that is abnormal or we need to change your treatment, we will call you to review the  results.   Testing/Procedures: None   Follow-Up: At [facility], you and your health needs are our priority.  As part of our continuing mission to provide you with exceptional heart care, we have created designated Provider Care Teams.  These Care Teams include your primary Cardiologist (physician) and Advanced Practice Providers (APPs -  Physician Assistants and Nurse Practitioners) who all work together to provide you with the care you need, when you need it.  We recommend signing up for the patient portal called "MyChart".  Sign up information is provided on this After Visit Summary.  MyChart is used to connect with patients for Virtual Visits (Telemedicine).  Patients are able to view lab/test results, encounter notes, upcoming appointments, etc.  Non-urgent messages can be sent to your provider as well.   To learn more about what you can do with MyChart, go to [email or web address].    Your next appointment:   2 month(s)  The format for your next appointment:   In Person  Provider:   Oswaldo Milian, MD     Other Instructions       Signed, Donato Heinz, MD  03/14/2021 9:51 PM    [place]

## 2021-03-12 NOTE — Telephone Encounter (Signed)
Detailed message left for patient that we need his updated insurance information in order to complete PA

## 2021-03-12 NOTE — Telephone Encounter (Signed)
KeySebastian Sandoval - Rx #: [id number] Need help? Call us at [phone] Drug HYDROcodone-Acetaminophen 5-325MG  tablets   Your request has been approved Effective from 03/12/2021 through 04/10/2021.  Pharmacy and patient aware.

## 2021-03-14 ENCOUNTER — Other Ambulatory Visit: Payer: Self-pay

## 2021-03-14 ENCOUNTER — Institutional Professional Consult (permissible substitution): Payer: 59 | Admitting: Cardiology

## 2021-03-14 ENCOUNTER — Ambulatory Visit: Payer: BC Managed Care – PPO | Admitting: Cardiology

## 2021-03-14 ENCOUNTER — Encounter: Payer: Self-pay | Admitting: Cardiology

## 2021-03-14 ENCOUNTER — Other Ambulatory Visit: Payer: Self-pay | Admitting: Family Medicine

## 2021-03-14 VITALS — BP 116/58 | HR 66 | Ht 69.0 in | Wt 165.0 lb

## 2021-03-14 DIAGNOSIS — I5041 Acute combined systolic (congestive) and diastolic (congestive) heart failure: Secondary | ICD-10-CM | POA: Diagnosis not present

## 2021-03-14 DIAGNOSIS — Z09 Encounter for follow-up examination after completed treatment for conditions other than malignant neoplasm: Secondary | ICD-10-CM

## 2021-03-14 DIAGNOSIS — Z720 Tobacco use: Secondary | ICD-10-CM | POA: Diagnosis not present

## 2021-03-14 DIAGNOSIS — I10 Essential (primary) hypertension: Secondary | ICD-10-CM | POA: Diagnosis not present

## 2021-03-14 DIAGNOSIS — I4819 Other persistent atrial fibrillation: Secondary | ICD-10-CM

## 2021-03-14 DIAGNOSIS — I5021 Acute systolic (congestive) heart failure: Secondary | ICD-10-CM

## 2021-03-14 MED ORDER — LOSARTAN POTASSIUM 50 MG PO TABS: 50.0000 mg | ORAL_TABLET | Freq: Every day | ORAL | 3 refills | Status: DC

## 2021-03-14 MED ORDER — EMPAGLIFLOZIN 10 MG PO TABS: 10.0000 mg | ORAL_TABLET | Freq: Every day | ORAL | 6 refills | Status: DC

## 2021-03-14 NOTE — Patient Instructions (Signed)
Medication Instructions:  Your physician has recommended you make the following change in your medication:  STOP Lisinopril START Losartan 50 mg tablets daily START Jardiance 10 mg tablets daily  *If you need a refill on your cardiac medications before your next appointment, please call your pharmacy*   Lab Work: IN ONE WEEK: BMET MAG If you have labs (blood work) drawn today and your tests are completely normal, you will receive your results only by: [place] (if you have MyChart) OR A paper copy in the mail If you have any lab test that is abnormal or we need to change your treatment, we will call you to review the results.   Testing/Procedures: None   Follow-Up: At [facility], you and your health needs are our priority.  As part of our continuing mission to provide you with exceptional heart care, we have created designated Provider Care Teams.  These Care Teams include your primary Cardiologist (physician) and Advanced Practice Providers (APPs -  Physician Assistants and Nurse Practitioners) who all work together to provide you with the care you need, when you need it.  We recommend signing up for the patient portal called "MyChart".  Sign up information is provided on this After Visit Summary.  MyChart is used to connect with patients for Virtual Visits (Telemedicine).  Patients are able to view lab/test results, encounter notes, upcoming appointments, etc.  Non-urgent messages can be sent to your provider as well.   To learn more about what you can do with MyChart, go to [email or web address].    Your next appointment:   2 month(s)  The format for your next appointment:   In Person  Provider:   Oswaldo Milian, MD     Other Instructions

## 2021-03-25 NOTE — Progress Notes (Deleted)
Electrophysiology Office Note:    Date:  03/25/2021   ID:  Thomas Medici., DOB [birth year], MRN [id number]  PCP:  Chevis Pretty, FNP  [facility] Cardiologist:  None  [facility] Electrophysiologist:  Vickie Epley, MD   Referring MD: Oliver Barre, PA   Chief Complaint: Atrial fibrillation  History of Present Illness:    Thomas Cisek. is a 66 y.o. male who presents for an evaluation of atrial fibrillation at the request of Adline Peals, PA-C. Their medical history includes alcohol abuse, tobacco abuse, COPD, perforated gastric ulcer requiring repair in 2016, diabetes, hypertension, atrial fibrillation/flutter, chronic systolic heart failure.  He was hospitalized in September 2022 with acute pancreatitis and was found to be in atrial fibrillation.  His EF was 30 to 35% at that time.  Xarelto started in October 2022 and he was referred to the atrial fibrillation clinic at that time.  He underwent cardioversion on January 20, 2021.  He saw Dr. Gardiner Rhyme on March 14, 2021.  At that appointment he reported shortness of breath, lightheadedness.  Denies syncope.  He smokes 2 packs/day.  No alcohol since September 2022.   Past Medical History:  Diagnosis Date   Arthritis    Bladder cancer ([place])    Chronic back pain    COPD (chronic obstructive pulmonary disease) (HCC)    DDD (degenerative disc disease)    Diabetic retinopathy of both eyes (HCC)    Essential hypertension    GERD (gastroesophageal reflux disease)    History of atrial flutter 02/2011   Converted to NSR with Cardizem   History of chronic bronchitis    History of hemolytic anemia 02/2011   secondary to Avelox   HOH (hard of hearing)    HOH (hard of hearing)    no eardrum and nerve damage on R, also HOH on L   Mitral valve prolapse    a. 2D Echo 11/27/14: EF 55-60%; images were inadequate for LV wall motion assessment, + mild late systolic mitral valve prolapse involving the anterior  leaflet.   PAD (peripheral artery disease) ([place]) 04/2014   Dr Trula Slade; bilateral SFA occlusion, R mid, L distal   PAF (paroxysmal atrial fibrillation) ([place])    a. Dx 11/2014 during admission for perf ulcer.   Perforated ulcer ([place])    a. 11/2014 s/p surgery.   Productive cough    Smokers' cough ([place])    Type 1 diabetes mellitus ([place]) 1977    Past Surgical History:  Procedure Laterality Date   CARDIOVERSION N/A 01/20/2021   Procedure: CARDIOVERSION;  Surgeon: Fay Records, MD;  Location: [place];  Service: Cardiovascular;  Laterality: N/A;   CYSTOSCOPY WITH URETEROSCOPY Right 08/14/2013   Procedure: CYSTOSCOPY WITH URETEROSCOPY BLADDER BIOPSY ;  Surgeon: Claybon Jabs, MD;  Location: [facility];  Service: Urology;  Laterality: Right;   ESOPHAGOGASTRODUODENOSCOPY N/A 02/06/2013   Procedure: ESOPHAGOGASTRODUODENOSCOPY (EGD);  Surgeon: Lafayette Dragon, MD;  Location: [facility] ENDOSCOPY;  Service: Endoscopy;  Laterality: N/A;   LAPAROSCOPY N/A 11/25/2014   Procedure: LAPAROSCOPIC PRIMARY REPAIR OF PERFORATED PREPYLORIC ULCER WITH Silvestre Gunner;  Surgeon: Greer Pickerel, MD;  Location: [place];  Service: General;  Laterality: N/A;   TONSILLECTOMY  as child   TRANSTHORACIC ECHOCARDIOGRAM  02-17-2011   MODERATE LVH/  EF 65%   TRANSURETHRAL RESECTION OF BLADDER TUMOR WITH GYRUS (TURBT-GYRUS) N/A 06/12/2013   Procedure: TRANSURETHRAL RESECTION OF BLADDER TUMOR WITH GYRUS (TURBT-GYRUS);  Surgeon: Claybon Jabs, MD;  Location:  [place];  Service: Urology;  Laterality: N/A;   TYMPANIC MEMBRANE REPAIR  as child    Current Medications: No outpatient medications have been marked as taking for the 03/26/21 encounter (Appointment) with Vickie Epley, MD.     Allergies:   Azithromycin, Avelox [moxifloxacin hcl in nacl], and Bactrim [sulfamethoxazole-trimethoprim]   Social History   Socioeconomic History   Marital status: Married    Spouse name: Not on file   Number of  children: Not on file   Years of education: Not on file   Highest education level: Not on file  Occupational History   Occupation: Tobacco Farmer  Tobacco Use   Smoking status: Every Day    Packs/day: 2.00    Years: 30.00    Pack years: 60.00    Types: Cigarettes   Smokeless tobacco: Former    Quit date: 06/08/1978   Tobacco comments:    Couple packs per day 12/25/2020  Vaping Use   Vaping Use: Never used  Substance and Sexual Activity   Alcohol use: Not Currently    Comment: 5 quarts per week   Drug use: No   Sexual activity: Not on file  Other Topics Concern   Not on file  Social History Narrative   Lives in [place] with wife and 2 sons.    Social Determinants of Health   Financial Resource Strain: Not on file  Food Insecurity: Not on file  Transportation Needs: Not on file  Physical Activity: Not on file  Stress: Not on file  Social Connections: Not on file     Family History: The patient's family history includes Breast cancer in his mother; Cancer in his mother; Diabetes in his son; Heart attack in his father; Heart disease in his father; Rheumatic fever in his father.  ROS:   Please see the history of present illness.    All other systems reviewed and are negative.  EKGs/Labs/Other Studies Reviewed:    The following studies were reviewed today:  December 05, 2020 echo Left ventricular function moderately reduced, 30 to 35% Right ventricular function low normal Moderately dilated left atrium Trivial MR  February 16, 2021 ZIO 100% atrial fibrillation with ventricular rates ranging from 53 to 185 bpm.  March 14, 2021 EKG shows sinus rhythm  EKG:  The ekg ordered today demonstrates ***   Recent Labs: 12/06/2020: Magnesium 1.9 12/17/2020: ALT 12; BNP 200.0 01/08/2021: BUN 10; Creatinine, Ser 1.01; Hemoglobin 14.3; Platelets 246; Potassium 4.6; Sodium 135  Recent Lipid Panel    Component Value Date/Time   CHOL 121 10/25/2020 1442   TRIG 143  10/25/2020 1442   HDL 54 10/25/2020 1442   CHOLHDL 2.2 10/25/2020 1442   LDLCALC 43 10/25/2020 1442    Physical Exam:    VS:  There were no vitals taken for this visit.    Wt Readings from Last 3 Encounters:  03/14/21 165 lb (74.8 kg)  02/06/21 169 lb 12.8 oz (77 kg)  01/24/21 176 lb 6.4 oz (80 kg)     GEN: *** Well nourished, well developed in no acute distress HEENT: Normal NECK: No JVD; No carotid bruits LYMPHATICS: No lymphadenopathy CARDIAC: ***RRR, no murmurs, rubs, gallops RESPIRATORY:  Clear to auscultation without rales, wheezing or rhonchi  ABDOMEN: Soft, non-tender, non-distended MUSCULOSKELETAL:  No edema; No deformity  SKIN: Warm and dry NEUROLOGIC:  Alert and oriented x 3 PSYCHIATRIC:  Normal affect       ASSESSMENT:    No diagnosis found. PLAN:  In order of problems listed above:  Ablation followed by watchman Anticoagulation risk secondary to heavy alcohol use history and gastric ulcer       Total time spent with patient today *** minutes. This includes reviewing records, evaluating the patient and coordinating care.  Medication Adjustments/Labs and Tests Ordered: Current medicines are reviewed at length with the patient today.  Concerns regarding medicines are outlined above.  No orders of the defined types were placed in this encounter.  No orders of the defined types were placed in this encounter.    Signed, Hilton Cork. Quentin Ore, MD, [facility], [facility] 03/25/2021 9:26 PM    Electrophysiology [facility]

## 2021-03-26 ENCOUNTER — Ambulatory Visit: Payer: BC Managed Care – PPO | Admitting: Cardiology

## 2021-03-26 ENCOUNTER — Other Ambulatory Visit: Payer: Self-pay

## 2021-03-26 ENCOUNTER — Encounter: Payer: Self-pay | Admitting: Cardiology

## 2021-03-26 VITALS — BP 128/80 | HR 69 | Ht 69.0 in | Wt 172.4 lb

## 2021-03-26 DIAGNOSIS — Z720 Tobacco use: Secondary | ICD-10-CM | POA: Diagnosis not present

## 2021-03-26 DIAGNOSIS — I10 Essential (primary) hypertension: Secondary | ICD-10-CM | POA: Diagnosis not present

## 2021-03-26 DIAGNOSIS — I5022 Chronic systolic (congestive) heart failure: Secondary | ICD-10-CM | POA: Diagnosis not present

## 2021-03-26 DIAGNOSIS — I4819 Other persistent atrial fibrillation: Secondary | ICD-10-CM

## 2021-03-26 MED ORDER — LISINOPRIL 10 MG PO TABS: 10.0000 mg | ORAL_TABLET | Freq: Every day | ORAL | 3 refills | Status: DC

## 2021-03-26 NOTE — Patient Instructions (Addendum)
Medication Instructions:  Stop Jardiance Stop Losartan Start Lisinopril 10 mg daily Your physician recommends that you continue on your current medications as directed. Please refer to the Current Medication list given to you today. *If you need a refill on your cardiac medications before your next appointment, please call your pharmacy*  Lab Work: None. If you have labs (blood work) drawn today and your tests are completely normal, you will receive your results only by: [place] (if you have MyChart) OR A paper copy in the mail If you have any lab test that is abnormal or we need to change your treatment, we will call you to review the results.  Testing/Procedures: None.  Follow-Up: At [facility], you and your health needs are our priority.  As part of our continuing mission to provide you with exceptional heart care, we have created designated Provider Care Teams.  These Care Teams include your primary Cardiologist (physician) and Advanced Practice Providers (APPs -  Physician Assistants and Nurse Practitioners) who all work together to provide you with the care you need, when you need it.  Your physician wants you to follow-up in: 6 months with  one of the following Advanced Practice Providers on your designated Care Team:    Thomas Sandoval, Thomas Sandoval "Thomas Sandoval" [place], Thomas   You will receive a reminder letter in the mail two months in advance. If you don't receive a letter, please call our office to schedule the follow-up appointment.   We recommend signing up for the patient portal called "MyChart".  Sign up information is provided on this After Visit Summary.  MyChart is used to connect with patients for Virtual Visits (Telemedicine).  Patients are able to view lab/test results, encounter notes, upcoming appointments, etc.  Non-urgent messages can be sent to your provider as well.   To learn more about what you can do with MyChart, go to [email or web address].    Any  Other Special Instructions Will Be Listed Below (If Applicable).

## 2021-03-26 NOTE — Progress Notes (Signed)
Electrophysiology Office Note:    Date:  03/26/2021   ID:  Thomas Medici., DOB [birth year], MRN [id number]  PCP:  Chevis Pretty, FNP  [facility] Cardiologist:  None  [facility] Electrophysiologist:  Vickie Epley, MD   Referring MD: Oliver Barre, PA   Chief Complaint: Atrial fibrillation  History of Present Illness:    Thomas Maslowski. is a 66 y.o. male who presents for an evaluation of atrial fibrillation at the request of Adline Peals, PA-C. Their medical history includes alcohol abuse, tobacco abuse, COPD, perforated gastric ulcer requiring repair in 2016, diabetes, hypertension, atrial fibrillation/flutter, chronic systolic heart failure.  He was hospitalized in September 2022 with acute pancreatitis and was found to be in atrial fibrillation.  His EF was 30 to 35% at that time.  Xarelto started in October 2022 and he was referred to the atrial fibrillation clinic at that time.  He underwent cardioversion on January 20, 2021.  He saw Dr. Gardiner Rhyme on March 14, 2021.  At that appointment he reported shortness of breath, lightheadedness.  Denies syncope.  He smokes 2 packs/day.  No alcohol since September 2022.  He is accompanied by a family member. While out of rhythm, he typically is not able to appreciate any associated symptoms. He notes that he used to be symptomatic 5 years ago. However, he states that if his blood sugar drops, he is able to tell that his heart rate increases. Of note, he reports that since lisinopril was switched to [place], he still has a persistent cough about 1-2 times a day. He denies any palpitations, chest pain, or shortness of breath. No lightheadedness, headaches, syncope, orthopnea, PND, lower extremity edema or exertional symptoms. He denies any bleeding issues.        Past Medical History:  Diagnosis Date   Arthritis    Bladder cancer ([place])    Chronic back pain    COPD (chronic obstructive pulmonary disease)  (HCC)    DDD (degenerative disc disease)    Diabetic retinopathy of both eyes (HCC)    Essential hypertension    GERD (gastroesophageal reflux disease)    History of atrial flutter 02/2011   Converted to NSR with Cardizem   History of chronic bronchitis    History of hemolytic anemia 02/2011   secondary to Avelox   HOH (hard of hearing)    HOH (hard of hearing)    no eardrum and nerve damage on R, also HOH on L   Mitral valve prolapse    a. 2D Echo 11/27/14: EF 55-60%; images were inadequate for LV wall motion assessment, + mild late systolic mitral valve prolapse involving the anterior leaflet.   PAD (peripheral artery disease) ([place]) 04/2014   Dr Trula Slade; bilateral SFA occlusion, R mid, L distal   PAF (paroxysmal atrial fibrillation) ([place])    a. Dx 11/2014 during admission for perf ulcer.   Perforated ulcer ([place])    a. 11/2014 s/p surgery.   Productive cough    Smokers' cough ([place])    Type 1 diabetes mellitus ([place]) 1977    Past Surgical History:  Procedure Laterality Date   CARDIOVERSION N/A 01/20/2021   Procedure: CARDIOVERSION;  Surgeon: Fay Records, MD;  Location: [facility];  Service: Cardiovascular;  Laterality: N/A;   CYSTOSCOPY WITH URETEROSCOPY Right 08/14/2013   Procedure: CYSTOSCOPY WITH URETEROSCOPY BLADDER BIOPSY ;  Surgeon: Claybon Jabs, MD;  [facility];  Service: Urology;  Laterality: Right;   ESOPHAGOGASTRODUODENOSCOPY  N/A 02/06/2013   Procedure: ESOPHAGOGASTRODUODENOSCOPY (EGD);  Surgeon: Lafayette Dragon, MD;  Location: [facility] ENDOSCOPY;  Service: Endoscopy;  Laterality: N/A;   LAPAROSCOPY N/A 11/25/2014   Procedure: LAPAROSCOPIC PRIMARY REPAIR OF PERFORATED PREPYLORIC ULCER WITH Silvestre Gunner;  Surgeon: Greer Pickerel, MD;  Location: [facility];  Service: General;  Laterality: N/A;   TONSILLECTOMY  as child   TRANSTHORACIC ECHOCARDIOGRAM  02-17-2011   MODERATE LVH/  EF 65%   TRANSURETHRAL RESECTION OF BLADDER TUMOR WITH GYRUS (TURBT-GYRUS) N/A  06/12/2013   Procedure: TRANSURETHRAL RESECTION OF BLADDER TUMOR WITH GYRUS (TURBT-GYRUS);  Surgeon: Claybon Jabs, MD;  Location[facility];  Service: Urology;  Laterality: N/A;   TYMPANIC MEMBRANE REPAIR  as child    Current Medications: Current Meds  Medication Sig   cetirizine (ZYRTEC) 10 MG tablet Take 10 mg by mouth daily as needed for allergies.   empagliflozin (JARDIANCE) 10 MG TABS tablet Take 1 tablet (10 mg total) by mouth daily before breakfast.   famotidine (PEPCID) 20 MG tablet Take 1 tablet (20 mg total) by mouth every morning. Reported on 04/16/2015   Fluticasone-Umeclidin-Vilant (TRELEGY ELLIPTA) 100-62.5-25 MCG/INH AEPB Inhale 1 puff into the lungs 2 (two) times daily.   furosemide (LASIX) 20 MG tablet Take 1 tablet (20 mg total) by mouth daily as needed for fluid or edema.   HYDROcodone-acetaminophen (NORCO) 5-325 MG tablet Take 1 tablet by mouth 2 (two) times daily.   insulin glargine (LANTUS) 100 UNIT/ML injection Inject 0.4 mLs (40 Units total) into the skin in the morning.   Insulin Syringe-Needle U-100 (B-D INS SYR ULTRAFINE 1CC/30G) 30G X 1/2" 1 ML MISC Use 4 times a day with insulin Dx E11.9   Insulin Syringes, Disposable, U-100 1 ML MISC Use 4 times a day for insulin injection Dx E11.9   losartan (COZAAR) 50 MG tablet Take 1 tablet (50 mg total) by mouth daily.   metoprolol succinate (TOPROL-XL) 50 MG 24 hr tablet Take 1.5 tablets (75 mg total) by mouth daily. Take with or immediately following a meal.   NOVOLOG 100 UNIT/ML injection PER SLIDING SCALE: 190 - 200 = 2 UNITS 300 AND ABOVE = 7 UNITS   OVER THE COUNTER MEDICATION Take 1 Scoop by mouth in the morning and at bedtime. Super Beets   XARELTO 20 MG TABS tablet TAKE 1 TABLET BY MOUTH DAILY WITH SUPPER.     Allergies:   Azithromycin, Avelox [moxifloxacin hcl in nacl], and Bactrim [sulfamethoxazole-trimethoprim]   Social History   Socioeconomic History   Marital status: Married    Spouse name:  Not on file   Number of children: Not on file   Years of education: Not on file   Highest education level: Not on file  Occupational History   Occupation: Tobacco Farmer  Tobacco Use   Smoking status: Every Day    Packs/day: 2.00    Years: 30.00    Pack years: 60.00    Types: Cigarettes   Smokeless tobacco: Former    Quit date: 06/08/1978   Tobacco comments:    Couple packs per day 12/25/2020  Vaping Use   Vaping Use: Never used  Substance and Sexual Activity   Alcohol use: Not Currently    Comment: 5 quarts per week   Drug use: No   Sexual activity: Not on file  Other Topics Concern   Not on file  Social History Narrative   Lives in [place] with wife and 2 sons.    Social Determinants of  Health   Financial Resource Strain: Not on file  Food Insecurity: Not on file  Transportation Needs: Not on file  Physical Activity: Not on file  Stress: Not on file  Social Connections: Not on file     Family History: The patient's family history includes Breast cancer in his mother; Cancer in his mother; Diabetes in his son; Heart attack in his father; Heart disease in his father; Rheumatic fever in his father.  ROS:   Please see the history of present illness.    (+) Cough All other systems reviewed and are negative.  EKGs/Labs/Other Studies Reviewed:    The following studies were reviewed today:  December 05, 2020 echo Left ventricular function moderately reduced, 30 to 35% Right ventricular function low normal Moderately dilated left atrium Trivial MR  February 16, 2021 ZIO 100% atrial fibrillation with ventricular rates ranging from 53 to 185 bpm.  March 14, 2021 EKG shows sinus rhythm  EKG:   03/26/2021: Sinus rhythm   Recent Labs: 12/06/2020: Magnesium 1.9 12/17/2020: ALT 12; BNP 200.0 01/08/2021: BUN 10; Creatinine, Ser 1.01; Hemoglobin 14.3; Platelets 246; Potassium 4.6; Sodium 135  Recent Lipid Panel    Component Value Date/Time   CHOL 121 10/25/2020  1442   TRIG 143 10/25/2020 1442   HDL 54 10/25/2020 1442   CHOLHDL 2.2 10/25/2020 1442   LDLCALC 43 10/25/2020 1442    Physical Exam:    VS:  BP 128/80    Pulse 69    Ht 5\' 9"  (1.753 m)    Wt 172 lb 6.4 oz (78.2 kg)    SpO2 90%    BMI 25.46 kg/m     Wt Readings from Last 3 Encounters:  03/26/21 172 lb 6.4 oz (78.2 kg)  03/14/21 165 lb (74.8 kg)  02/06/21 169 lb 12.8 oz (77 kg)     GEN: Well nourished, well developed in no acute distress HEENT: Normal NECK: No JVD; No carotid bruits LYMPHATICS: No lymphadenopathy CARDIAC: RRR, no murmurs, rubs, gallops RESPIRATORY:  Clear to auscultation without rales, wheezing or rhonchi  ABDOMEN: Soft, non-tender, non-distended MUSCULOSKELETAL:  No edema; No deformity  SKIN: Warm and dry NEUROLOGIC:  Alert and oriented x 3 PSYCHIATRIC:  Normal affect       ASSESSMENT:    1. Persistent atrial fibrillation ([place])   2. Chronic systolic heart failure ([place])   3. Essential hypertension   4. Tobacco use    PLAN:    In order of problems listed above:  #Persistent atrial fibrillation Seemingly asymptomatic according to the patient his wife.  I do think a rhythm control strategy however is indicated because of his history of chronic systolic heart failure.  He is maintaining sinus rhythm on last couple of visits.  I discussed rhythm control strategies including catheter ablation during today's visit.  He would like some time to monitor his rhythm.  If he has recurrence he is interested in pursuing catheter ablation.  Given his history of GI bleeding and ulcers, I do think he is a candidate for the watchman procedure as well.  If we need to revisit the idea of catheter ablation, would discuss a staged ablation followed by watchman.  We will have him see one of the APP's in 6 months.  If recurrence of atrial fibrillation, we will plan to see him myself to discuss catheter ablation.  He will continue Xarelto for now for stroke  prophylaxis.  #Hypertension Controlled  #Tobacco abuse Likely contributing to his chronic cough.  He  would like to transition back to lisinopril and stop his Jardiance and losartan because of cost concerns.  I will make these changes today.  Follow-up with an APP in 6 months or sooner as needed.   Follow-up in 6 months.   Medication Adjustments/Labs and Tests Ordered: Current medicines are reviewed at length with the patient today.  Concerns regarding medicines are outlined above.   No orders of the defined types were placed in this encounter.  No orders of the defined types were placed in this encounter.  I,Thomas Sandoval,acting as a Education administrator for Vickie Epley, MD.,have documented all relevant documentation on the behalf of Vickie Epley, MD,as directed by  Vickie Epley, MD while in the presence of Vickie Epley, MD.  I, Vickie Epley, MD, have reviewed all documentation for this visit. The documentation on 03/26/21 for the exam, diagnosis, procedures, and orders are all accurate and complete.   Signed, Thomas Cork. Quentin Ore, MD, [facility], [facility] 03/26/2021 3:05 PM    Electrophysiology [facility]

## 2021-03-31 ENCOUNTER — Telehealth: Payer: Self-pay | Admitting: Cardiology

## 2021-03-31 DIAGNOSIS — R404 Transient alteration of awareness: Secondary | ICD-10-CM | POA: Diagnosis not present

## 2021-03-31 DIAGNOSIS — E162 Hypoglycemia, unspecified: Secondary | ICD-10-CM | POA: Diagnosis not present

## 2021-03-31 NOTE — Telephone Encounter (Signed)
Medication prescribed by Dr Gardiner Rhyme. Not listed on current med list. Please address.

## 2021-03-31 NOTE — Telephone Encounter (Signed)
Patient's wife states the patient will be completely out of Jardiance in 1 day. She is requesting a refill and samples to hold him over until the medication can be approved by insurance. She states there is normally a delay on the approval.  Patient calling the office for samples of medication:  1.  What medication and dosage are you requesting samples for? Jardiance 2.  Are you currently out of this medication?  No, patient will be out of medication in 1 day  *STAT* If patient is at the pharmacy, call can be transferred to refill team.   1. Which medications need to be refilled? (please list name of each medication and dose if known)  Jardiance  2. Which pharmacy/location (including street and city if local pharmacy) is medication to be sent to? [id number] [place] [place] [address]  3. Do they need a 30 day or 90 day supply?  90 day supply

## 2021-04-01 ENCOUNTER — Other Ambulatory Visit (INDEPENDENT_AMBULATORY_CARE_PROVIDER_SITE_OTHER): Payer: BC Managed Care – PPO | Admitting: *Deleted

## 2021-04-01 DIAGNOSIS — I5022 Chronic systolic (congestive) heart failure: Secondary | ICD-10-CM | POA: Diagnosis not present

## 2021-04-01 DIAGNOSIS — I4819 Other persistent atrial fibrillation: Secondary | ICD-10-CM

## 2021-04-01 MED ORDER — EMPAGLIFLOZIN 10 MG PO TABS: 10.0000 mg | ORAL_TABLET | Freq: Every day | ORAL | 11 refills | Status: DC

## 2021-04-01 MED ORDER — METOPROLOL SUCCINATE ER 50 MG PO TB24
75.0000 mg | ORAL_TABLET | Freq: Every day | ORAL | 3 refills | Status: DC
Start: 2021-04-01 — End: 2021-07-19

## 2021-04-01 NOTE — Telephone Encounter (Signed)
Can refill the Jardiance 10 mg daily but he was supposed to have labs checked 1 week after starting, needs to have BMET checked

## 2021-04-01 NOTE — Telephone Encounter (Signed)
LMTCB regarding orders. 

## 2021-04-01 NOTE — Telephone Encounter (Signed)
Spoke with pt wife, she reports the patient ran out of the [place] and has been taking it daily. New script sent to the pharmacy and refill for metoprolol also sent in. Samples and savings card placed at the front desk for pick up. Patient wife voiced understanding that he has to have lab work done after taking for 1 week.

## 2021-04-01 NOTE — Telephone Encounter (Signed)
Patient's wife returned call.  

## 2021-04-01 NOTE — Telephone Encounter (Signed)
I believe that Angie Fava is out today is there another nurse that can contact the pt in regards to this I have her on the line following up

## 2021-04-02 NOTE — Telephone Encounter (Signed)
Wife walked into office requesting samples of Jardiance. Two sample boxes given Lot # [id number], Exp: May /2024.

## 2021-04-09 ENCOUNTER — Other Ambulatory Visit: Payer: Self-pay

## 2021-04-09 DIAGNOSIS — I4819 Other persistent atrial fibrillation: Secondary | ICD-10-CM

## 2021-04-09 NOTE — Addendum Note (Signed)
Addended by: Reather Converse on: 04/09/2021 03:57 PM   Modules accepted: Orders

## 2021-04-10 LAB — BASIC METABOLIC PANEL
BUN/Creatinine Ratio: 13 (ref 10–24)
BUN: 13 mg/dL (ref 8–27)
CO2: 25 mmol/L (ref 20–29)
Calcium: 9.7 mg/dL (ref 8.6–10.2)
Chloride: 96 mmol/L (ref 96–106)
Creatinine, Ser: 1.01 mg/dL (ref 0.76–1.27)
Glucose: 81 mg/dL (ref 70–99)
Potassium: 4.3 mmol/L (ref 3.5–5.2)
Sodium: 136 mmol/L (ref 134–144)
eGFR: 84 mL/min/{1.73_m2} (ref >59)

## 2021-04-10 LAB — MAGNESIUM: Magnesium: 2 mg/dL (ref 1.6–2.3)

## 2021-04-15 ENCOUNTER — Telehealth: Payer: Self-pay | Admitting: Cardiology

## 2021-04-15 NOTE — Telephone Encounter (Signed)
° °  Pt c/o medication issue:  1. Name of Medication: empagliflozin (JARDIANCE) 10 MG TABS tablet  2. How are you currently taking this medication (dosage and times per day)? Take 1 tablet (10 mg total) by mouth daily before breakfast.  3. Are you having a reaction (difficulty breathing--STAT)?   4. What is your medication issue? Pt's wife calling asking for Jardiance coupon, she said she is going to [employer] and can pick it up at [employer] office

## 2021-04-15 NOTE — Telephone Encounter (Signed)
Spoke with wife who wanted coupons and samples for Jardiance 10 mg. Three sample bottles given.

## 2021-04-16 ENCOUNTER — Telehealth: Payer: Self-pay | Admitting: Nurse Practitioner

## 2021-04-16 NOTE — Telephone Encounter (Signed)
Patient has to be seen in person for anything like this

## 2021-04-16 NOTE — Telephone Encounter (Signed)
PATIENT WIFE AWARE SHE WOULD LIKE FOR THIS TO BE DISCUSSED AT HIS APPT TOMORROW

## 2021-04-16 NOTE — Telephone Encounter (Signed)
This will have to wait for PCP since it is controlled medication.

## 2021-04-16 NOTE — Telephone Encounter (Signed)
Pts wife called to let [facility] know that [facility] along with other pharmacies in the area. Was advised by pharmacist to call [facility] and see if she can prescribe pt some medicine that is similar to Hydrocodone since they dont have it.   Please advise and call patient or wife.

## 2021-04-17 ENCOUNTER — Ambulatory Visit: Payer: BC Managed Care – PPO | Admitting: Nurse Practitioner

## 2021-04-17 ENCOUNTER — Encounter: Payer: Self-pay | Admitting: Nurse Practitioner

## 2021-04-17 VITALS — BP 109/66 | HR 103 | Temp 97.0°F | Resp 20 | Ht 69.0 in | Wt 164.0 lb

## 2021-04-17 DIAGNOSIS — G8929 Other chronic pain: Secondary | ICD-10-CM | POA: Diagnosis not present

## 2021-04-17 DIAGNOSIS — J069 Acute upper respiratory infection, unspecified: Secondary | ICD-10-CM | POA: Diagnosis not present

## 2021-04-17 DIAGNOSIS — M545 Low back pain, unspecified: Secondary | ICD-10-CM | POA: Diagnosis not present

## 2021-04-17 MED ORDER — BENZONATATE 100 MG PO CAPS
100.0000 mg | ORAL_CAPSULE | Freq: Three times a day (TID) | ORAL | 0 refills | Status: DC | PRN
Start: 2021-04-17 — End: 2021-07-28

## 2021-04-17 MED ORDER — DOXYCYCLINE HYCLATE 100 MG PO TABS
100.0000 mg | ORAL_TABLET | Freq: Two times a day (BID) | ORAL | 0 refills | Status: DC
Start: 2021-04-17 — End: 2021-07-16

## 2021-04-17 MED ORDER — HYDROCODONE-ACETAMINOPHEN 5-325 MG PO TABS: 1.0000 | ORAL_TABLET | Freq: Two times a day (BID) | ORAL | 0 refills | Status: DC

## 2021-04-17 NOTE — Patient Instructions (Signed)

## 2021-04-17 NOTE — Progress Notes (Signed)
Subjective:    Patient ID: Thomas Sandoval., male    DOB: 1955/12/06, 66 y.o.   MRN: [id number]   Chief Complaint: Sore Throat, Nasal Congestion (No fever/), and Cough   URI  This is a new problem. The current episode started 1 to 4 weeks ago. The problem has been waxing and waning. There has been no fever. Associated symptoms include congestion, coughing, headaches, rhinorrhea and a sore throat. He has tried decongestant for the symptoms. The treatment provided mild relief.    [facility] and told him he would have to get from somewhere else. Will print prescription for him to get filled somewhere else.  Review of Systems  HENT:  Positive for congestion, rhinorrhea and sore throat.   Respiratory:  Positive for cough.   Neurological:  Positive for headaches.      Objective:   Physical Exam Vitals and nursing note reviewed.  Constitutional:      Appearance: Normal appearance.  HENT:     Right Ear: Tympanic membrane normal. There is no impacted cerumen.     Left Ear: Tympanic membrane normal. There is no impacted cerumen.     Nose: Congestion and rhinorrhea present.  Eyes:     Extraocular Movements: Extraocular movements intact.     Pupils: Pupils are equal, round, and reactive to light.  Cardiovascular:     Rate and Rhythm: Normal rate and regular rhythm.     Heart sounds: Normal heart sounds.  Pulmonary:     Effort: Pulmonary effort is normal.     Breath sounds: Wheezing (exp  wheezes) present.  Musculoskeletal:     Cervical back: Normal range of motion and neck supple.  Skin:    General: Skin is warm.  Neurological:     General: No focal deficit present.     Mental Status: He is alert and oriented to person, place, and time.  Psychiatric:        Mood and Affect: Mood normal.        Behavior: Behavior normal.    BP 109/66    Pulse (!) 103    Temp (!) 97 F (36.1 C) (Temporal)    Resp 20    Ht 5\' 9"  (1.753 m)    Wt 164 lb (74.4 kg)    SpO2 95%     BMI 24.22 kg/m        Assessment & Plan:  Thomas Sandoval. in today with chief complaint of Sore Throat, Nasal Congestion (No fever/), and Cough   1. Chronic midline low back pain without sciatica Pain prescription printed for him to take to a differrnt pharmacy - HYDROcodone-acetaminophen ([place]) 5-325 MG tablet; Take 1 tablet by mouth 2 (two) times daily.  Dispense: 60 tablet; Refill: 0  2. URI with cough and congestion 1. Take meds as prescribed 2. Use a cool mist humidifier especially during the winter months and when heat has been humid. 3. Use saline nose sprays frequently 4. Saline irrigations of the nose can be very helpful if done frequently.  * 4X daily for 1 week*  * Use of a nettie pot can be helpful with this. Follow directions with this* 5. Drink plenty of fluids 6. Keep thermostat turn down low 7.For any cough or congestion- tessolons 8. For fever or aces or pains- take tylenol or ibuprofen appropriate for age and weight.  * for fevers greater than 101 orally you may alternate ibuprofen and tylenol every  3  hours.    - benzonatate (TESSALON PERLES) 100 MG capsule; Take 1 capsule (100 mg total) by mouth 3 (three) times daily as needed for cough.  Dispense: 20 capsule; Refill: 0 - doxycycline (VIBRA-TABS) 100 MG tablet; Take 1 tablet (100 mg total) by mouth 2 (two) times daily. 1 po bid  Dispense: 20 tablet; Refill: 0    The above assessment and management plan was discussed with the patient. The patient verbalized understanding of and has agreed to the management plan. Patient is aware to call the clinic if symptoms persist or worsen. Patient is aware when to return to the clinic for a follow-up visit. Patient educated on when it is appropriate to go to the emergency department.   [name] Hassell Done, FNP

## 2021-04-20 ENCOUNTER — Other Ambulatory Visit: Payer: Self-pay | Admitting: Nurse Practitioner

## 2021-04-20 DIAGNOSIS — Z09 Encounter for follow-up examination after completed treatment for conditions other than malignant neoplasm: Secondary | ICD-10-CM

## 2021-04-20 DIAGNOSIS — I4891 Unspecified atrial fibrillation: Secondary | ICD-10-CM

## 2021-04-25 ENCOUNTER — Other Ambulatory Visit: Payer: Self-pay | Admitting: Nurse Practitioner

## 2021-04-25 DIAGNOSIS — J418 Mixed simple and mucopurulent chronic bronchitis: Secondary | ICD-10-CM

## 2021-04-28 ENCOUNTER — Encounter: Payer: Self-pay | Admitting: Nurse Practitioner

## 2021-04-28 ENCOUNTER — Ambulatory Visit: Payer: BC Managed Care – PPO | Admitting: Nurse Practitioner

## 2021-04-28 VITALS — BP 117/70 | HR 76 | Temp 97.6°F | Ht 69.0 in | Wt 168.6 lb

## 2021-04-28 DIAGNOSIS — I10 Essential (primary) hypertension: Secondary | ICD-10-CM

## 2021-04-28 DIAGNOSIS — Z125 Encounter for screening for malignant neoplasm of prostate: Secondary | ICD-10-CM

## 2021-04-28 DIAGNOSIS — M545 Low back pain, unspecified: Secondary | ICD-10-CM | POA: Diagnosis not present

## 2021-04-28 DIAGNOSIS — K2100 Gastro-esophageal reflux disease with esophagitis, without bleeding: Secondary | ICD-10-CM

## 2021-04-28 DIAGNOSIS — Z794 Long term (current) use of insulin: Secondary | ICD-10-CM | POA: Diagnosis not present

## 2021-04-28 DIAGNOSIS — H9193 Unspecified hearing loss, bilateral: Secondary | ICD-10-CM

## 2021-04-28 DIAGNOSIS — E875 Hyperkalemia: Secondary | ICD-10-CM

## 2021-04-28 DIAGNOSIS — N289 Disorder of kidney and ureter, unspecified: Secondary | ICD-10-CM

## 2021-04-28 DIAGNOSIS — G8929 Other chronic pain: Secondary | ICD-10-CM | POA: Diagnosis not present

## 2021-04-28 DIAGNOSIS — E119 Type 2 diabetes mellitus without complications: Secondary | ICD-10-CM | POA: Diagnosis not present

## 2021-04-28 DIAGNOSIS — F1020 Alcohol dependence, uncomplicated: Secondary | ICD-10-CM

## 2021-04-28 DIAGNOSIS — E785 Hyperlipidemia, unspecified: Secondary | ICD-10-CM | POA: Diagnosis not present

## 2021-04-28 DIAGNOSIS — K860 Alcohol-induced chronic pancreatitis: Secondary | ICD-10-CM

## 2021-04-28 DIAGNOSIS — I4819 Other persistent atrial fibrillation: Secondary | ICD-10-CM | POA: Diagnosis not present

## 2021-04-28 DIAGNOSIS — J449 Chronic obstructive pulmonary disease, unspecified: Secondary | ICD-10-CM

## 2021-04-28 MED ORDER — INSULIN GLARGINE 100 UNIT/ML ~~LOC~~ SOLN: 40.0000 [IU] | Freq: Every morning | SUBCUTANEOUS | 5 refills | Status: DC

## 2021-04-28 MED ORDER — FAMOTIDINE 20 MG PO TABS: 20.0000 mg | ORAL_TABLET | Freq: Every morning | ORAL | 5 refills | Status: DC

## 2021-04-28 MED ORDER — RIVAROXABAN 20 MG PO TABS: 20.0000 mg | ORAL_TABLET | Freq: Every day | ORAL | 5 refills | Status: DC

## 2021-04-28 MED ORDER — HYDROCODONE-ACETAMINOPHEN 5-325 MG PO TABS: 1.0000 | ORAL_TABLET | Freq: Two times a day (BID) | ORAL | 0 refills | Status: DC

## 2021-04-28 MED ORDER — FUROSEMIDE 20 MG PO TABS: 20.0000 mg | ORAL_TABLET | Freq: Every day | ORAL | 5 refills | Status: DC | PRN

## 2021-04-28 MED ORDER — HYDROCODONE-ACETAMINOPHEN 5-325 MG PO TABS: 1.0000 | ORAL_TABLET | Freq: Two times a day (BID) | ORAL | 0 refills | Status: AC

## 2021-04-28 MED ORDER — TRELEGY ELLIPTA 100-62.5-25 MCG/ACT IN AEPB: INHALATION_SPRAY | RESPIRATORY_TRACT | 5 refills | Status: DC

## 2021-04-28 NOTE — Progress Notes (Signed)
Subjective:    Patient ID: Thomas Medici., male    DOB: [birth year], 66 y.o.   MRN: [id number]  Chief Complaint: medical management of chronic issues     HPI:  Thomas Arrona. is a 66 y.o. who identifies as a male who was assigned male at birth.   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 Essential hypertension No c/o recent chest pain, sob or headache. Doe snot check blood pressure at home. BP Readings from Last 3 Encounters:  04/17/21 109/66  03/26/21 128/80  03/14/21 (!) 116/58     2. Type 2 diabetes mellitus without complication, with long-term current use of insulin (HCC) Fasting blood sugars are running around 110-140. No low blood sugars. Not sure insurance will pay for lantus. Lab Results  Component Value Date   HGBA1C 5.7 (H) 12/03/2020     3. Hyperlipidemia, unspecified hyperlipidemia type Does not watch diet and does no exercise. 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 Persistent atrial fibrillation (HCC) Denies any recent palpitations or heart racing  5. COPD with chronic bronchitis and emphysema ([place]) Is on trelegy and albuterol. He is still smoking. Smokes over a pack a day.  6. Alcohol-induced chronic pancreatitis ([place]) Says he does still drink a couple of times a week.  7. Gastroesophageal reflux disease with esophagitis without hemorrhage Is on pepcid daily and is doing well.  8. Bilateral hearing loss, unspecified hearing loss type Have to yell at him in order for him to hear you. He refuses to get hering aids  9. Mild renal  insufficiency Lab Results  Component Value Date   CREATININE 1.01 04/09/2021     10. Hyperkalemia Denies any muscle cramping Lab Results  Component Value Date   K 4.3 04/09/2021     11. Alcoholism ([place]) Says he does not drink everyday  12. Chronic midline low back pain without sciatica Pain assessment: Cause of pain- multiple injuries in the past Pain location- lower back mainly Pain on scale of 1-10- 6/10 Frequency- daily What increases pain-moving around What makes pain Better-rest helps Effects on ADL - none Any change in general medical condition-none  Current opioids rx- norco 5/325 BID # meds rx- 60 Effectiveness of current meds-helps Adverse reactions from pain meds-none Morphine equivalent- 10 MME  Pill count performed-No Last drug screen - 11/03/19 ( high risk q47m moderate risk q637mlow risk yearly ) Urine drug screen today- Yes Was [place]- yes  If yes were their any concerning findings? - no   Overdose risk: 1 Opioid Risk  04/28/2021  Alcohol 0  Illegal Drugs 0  Rx Drugs 0  Alcohol 3  Illegal Drugs 0  Rx Drugs 0  Age between 16-45 years  0  History of Preadolescent Sexual Abuse 0  Psychological Disease 0  Depression 0  Opioid Risk Tool Scoring 3  Opioid Risk Interpretation Low Risk     Pain contract signed on: 10/28/20    New complaints: None today  Allergies  Allergen  Reactions   Azithromycin Other (See Comments) and Nausea And Vomiting    "Severe stomach cramps; told to list as an allergy by dr. Huel Cote ago"   Avelox [Moxifloxacin Hcl In Nacl] Other (See Comments)    Hemolysis  In 2012   Bactrim [Sulfamethoxazole-Trimethoprim] Diarrhea and Nausea And Vomiting   Outpatient Encounter Medications as of 04/28/2021  Medication Sig   albuterol (PROVENTIL HFA;VENTOLIN HFA) 108 (90 Base) MCG/ACT inhaler Inhale 2 puffs every 6 (six) hours as needed into the lungs for wheezing or shortness of breath.   benzonatate (TESSALON  PERLES) 100 MG capsule Take 1 capsule (100 mg total) by mouth 3 (three) times daily as needed for cough.   cetirizine (ZYRTEC) 10 MG tablet Take 10 mg by mouth daily as needed for allergies.   doxycycline (VIBRA-TABS) 100 MG tablet Take 1 tablet (100 mg total) by mouth 2 (two) times daily. 1 po bid   empagliflozin (JARDIANCE) 10 MG TABS tablet Take 1 tablet (10 mg total) by mouth daily before breakfast.   famotidine (PEPCID) 20 MG tablet Take 1 tablet (20 mg total) by mouth every morning. Reported on 04/16/2015   furosemide (LASIX) 20 MG tablet Take 1 tablet (20 mg total) by mouth daily as needed for fluid or edema.   HYDROcodone-acetaminophen (NORCO) 5-325 MG tablet Take 1 tablet by mouth 2 (two) times daily.   insulin glargine (LANTUS) 100 UNIT/ML injection Inject 0.4 mLs (40 Units total) into the skin in the morning.   Insulin Syringe-Needle U-100 (B-D INS SYR ULTRAFINE 1CC/30G) 30G X 1/2" 1 ML MISC Use 4 times a day with insulin Dx E11.9   Insulin Syringes, Disposable, U-100 1 ML MISC Use 4 times a day for insulin injection Dx E11.9   metoprolol succinate (TOPROL-XL) 50 MG 24 hr tablet Take 1.5 tablets (75 mg total) by mouth daily. Take with or immediately following a meal.   NOVOLOG 100 UNIT/ML injection PER SLIDING SCALE: 190 - 200 = 2 UNITS 300 AND ABOVE = 7 UNITS   OVER THE COUNTER MEDICATION Take 1 Scoop by mouth in the morning and at bedtime. Super Beets   TRELEGY ELLIPTA 100-62.5-25 MCG/ACT AEPB INHALE 1 PUFF INTO THE LUNGS TWICE A DAY   XARELTO 20 MG TABS tablet TAKE 1 TABLET BY MOUTH DAILY WITH SUPPER   No facility-administered encounter medications on file as of 04/28/2021.    Past Surgical History:  Procedure Laterality Date   CARDIOVERSION N/A 01/20/2021   Procedure: CARDIOVERSION;  Surgeon: Fay Records, MD;  Location: [facility];  Service: Cardiovascular;  Laterality: N/A;   CYSTOSCOPY WITH URETEROSCOPY Right 08/14/2013   Procedure: CYSTOSCOPY WITH URETEROSCOPY BLADDER BIOPSY  ;  Surgeon: Claybon Jabs, MD;  Location[facility];  Service: Urology;  Laterality: Right;   ESOPHAGOGASTRODUODENOSCOPY N/A 02/06/2013   Procedure: ESOPHAGOGASTRODUODENOSCOPY (EGD);  Surgeon: Lafayette Dragon, MD;  Location: [facility] ENDOSCOPY;  Service: Endoscopy;  Laterality: N/A;   LAPAROSCOPY N/A 11/25/2014   Procedure: LAPAROSCOPIC PRIMARY REPAIR OF PERFORATED PREPYLORIC ULCER WITH Silvestre Gunner;  Surgeon: Greer Pickerel, MD;  Location: [facility];  Service: General;  Laterality: N/A;   TONSILLECTOMY  as child   TRANSTHORACIC ECHOCARDIOGRAM  02-17-2011   MODERATE LVH/  EF 65%   TRANSURETHRAL RESECTION OF BLADDER TUMOR WITH GYRUS (TURBT-GYRUS) N/A 06/12/2013   Procedure: TRANSURETHRAL RESECTION OF BLADDER TUMOR WITH GYRUS (TURBT-GYRUS);  Surgeon: Claybon Jabs, MD;  [facility];  Service: Urology;  Laterality: N/A;   TYMPANIC MEMBRANE REPAIR  as child    Family History  Problem Relation Age of Onset   Breast cancer Mother    Cancer Mother        Breast   Rheumatic fever Father    Heart disease Father    Heart attack Father        Massive    Diabetes Son        Review of Systems  Constitutional:  Negative for diaphoresis.  Eyes:  Negative for pain.  Respiratory:  Negative for shortness of breath.   Cardiovascular:  Negative for chest pain, palpitations and leg swelling.  Gastrointestinal:  Negative for abdominal pain.  Endocrine: Negative for polydipsia.  Skin:  Negative for rash.  Neurological:  Negative for dizziness, weakness and headaches.  Hematological:  Does not bruise/bleed easily.  All other systems reviewed and are negative.     Objective:   Physical Exam Vitals and nursing note reviewed.  Constitutional:      Appearance: Normal appearance. He is well-developed.  HENT:     Head: Normocephalic.     Nose: Nose normal.     Mouth/Throat:     Mouth: Mucous membranes are moist.     Pharynx: Oropharynx is clear.  Eyes:     Pupils:  Pupils are equal, round, and reactive to light.  Neck:     Thyroid: No thyroid mass or thyromegaly.     Vascular: No carotid bruit or JVD.     Trachea: Phonation normal.  Cardiovascular:     Rate and Rhythm: Normal rate and regular rhythm.  Pulmonary:     Effort: Pulmonary effort is normal. No respiratory distress.     Breath sounds: Normal breath sounds.  Abdominal:     General: Bowel sounds are normal.     Palpations: Abdomen is soft.     Tenderness: There is no abdominal tenderness.  Musculoskeletal:        General: Normal range of motion.     Cervical back: Normal range of motion and neck supple.  Lymphadenopathy:     Cervical: No cervical adenopathy.  Skin:    General: Skin is warm and dry.  Neurological:     Mental Status: He is alert and oriented to person, place, and time.  Psychiatric:        Behavior: Behavior normal.        Thought Content: Thought content normal.        Judgment: Judgment normal.    BP 117/70    Pulse 76    Temp 97.6 F (36.4 C) (Temporal)    Ht 5' 9"  (1.753 m)    Wt 168 lb 9.6 oz (76.5 kg)    SpO2 95%    BMI 24.90 kg/m        Assessment & Plan:   Thomas Medici. comes in today with chief complaint of No chief complaint on file.   Diagnosis and orders addressed:  1. Essential hypertension Low sodium diet - CBC with Differential/Platelet - CMP14+EGFR - furosemide (LASIX) 20 MG tablet; Take 1 tablet (20 mg total) by mouth daily as needed for fluid or edema.  Dispense: 30 tablet; Refill: 5  2. Type 2 diabetes mellitus without complication, with long-term current use of insulin (HCC) Strict carb cournting - Microalbumin / creatinine urine ratio - Bayer DCA Hb A1c Waived - insulin glargine (LANTUS) 100 UNIT/ML injection; Inject 0.4 mLs (40 Units total) into the skin in the morning.  Dispense: 30 mL; Refill: 5  3. Hyperlipidemia,  unspecified hyperlipidemia type Low fat diet - Lipid panel  4. Persistent atrial fibrillation  (HCC) - rivaroxaban (XARELTO) 20 MG TABS tablet; Take 1 tablet (20 mg total) by mouth daily with supper.  Dispense: 30 tablet; Refill: 5  5. COPD with chronic bronchitis and emphysema (HCC) Continue inhaler PLEASE stop smoking - Fluticasone-Umeclidin-Vilant (TRELEGY ELLIPTA) 100-62.5-25 MCG/ACT AEPB; INHALE 1 PUFF INTO THE LUNGS TWICE A DAY  Dispense: 60 each; Refill: 5  6. Alcohol-induced chronic pancreatitis (HCC) NO ALCOHOL  7. Gastroesophageal reflux disease with esophagitis without hemorrhage Avoid spicy foods Do not eat 2 hours prior to bedtime  - famotidine (PEPCID) 20 MG tablet; Take 1 tablet (20 mg total) by mouth every morning. Reported on 04/16/2015  Dispense: 30 tablet; Refill: 5  8. Bilateral hearing loss, unspecified hearing loss type Refuses hearing aids  9. Mild renal insufficiency 10. Hyperkalemia Labs pending  11. Alcoholism ([place]) Again no alcohol  12. Chronic midline low back pain without sciatica Will not increase pain meds If continues to drink will have  to top pain medication - ToxASSURE Select 13 (MW), Urine - HYDROcodone-acetaminophen (NORCO) 5-325 MG tablet; Take 1 tablet by mouth 2 (two) times daily.  Dispense: 60 tablet; Refill: 0 - HYDROcodone-acetaminophen (NORCO) 5-325 MG tablet; Take 1 tablet by mouth 2 (two) times daily.  Dispense: 60 tablet; Refill: 0 - HYDROcodone-acetaminophen (NORCO) 5-325 MG tablet; Take 1 tablet by mouth 2 (two) times daily.  Dispense: 60 tablet; Refill: 0  13. Prostate cancer screening Labs pending - PSA, total and free   Labs pending Health Maintenance reviewed Diet and exercise encouraged  Follow up plan: 3 months   [name] Hassell Done, FNP

## 2021-04-28 NOTE — Patient Instructions (Signed)
Alcohol Abuse and Dependence Information, Adult Alcohol is a widely available drug. People drink alcohol in different amounts. People who drink alcohol very often and in large amounts often have problems during and after drinking. They may develop what is called an alcohol use disorder. There are two main types of alcohol use disorders: Alcohol abuse. This is when you use alcohol too much or too often. You may use alcohol to make yourself feel happy or to reduce stress. You may have a hard time setting a limit on the amount you drink. Alcohol dependence. This is when you use alcohol consistently for a period of time, and your body changes as a result. This can make it hard to stop drinking because you may start to feel sick or feel different when you do not use alcohol. These symptoms are known as withdrawal. How can alcohol abuse and dependence affect me? Alcohol abuse and dependence can have a negative effect on your life. Drinking too much can lead to addiction. You may feel like you need alcohol to function normally. You may drink alcohol before work in the morning, during the day, or as soon as you get home from work in the evening. These actions can result in: Poor work performance. Job loss. Financial problems. Car crashes or criminal charges from driving after drinking alcohol. Problems in your relationships with friends and family. Losing the trust and respect of coworkers, friends, and family. Drinking heavily over a long period of time can permanently damage your body and brain, and can cause lifelong health issues, such as: Damage to your liver or pancreas. Heart problems, high blood pressure, or stroke. Certain cancers. Decreased ability to fight infections. Brain or nerve damage. Depression. Early (premature) death. If you are careless or you crave alcohol, it is easy to drink more than your body can handle (overdose). Alcohol overdose is a serious situation that requires  hospitalization. It may lead to permanent injuries or death. What can increase my risk? Having a family history of alcohol abuse. Having depression or other mental health conditions. Beginning to drink at an early age. Binge drinking often. Experiencing trauma, stress, and an unstable home life during childhood. Spending time with people who drink often. What actions can I take to prevent or manage alcohol abuse and dependence? Do not drink alcohol if: Your health care provider tells you not to drink. You are pregnant, may be pregnant, or are planning to become pregnant. If you drink alcohol: Limit how much you use to: 0-1 drink a day for women. 0-2 drinks a day for men. Be aware of how much alcohol is in your drink. In the [place] one drink equals one 12 oz bottle of beer (355 mL), one 5 oz glass of wine (148 mL), or one 1 oz glass of hard liquor (44 mL). Stop drinking if you have been drinking too much. This can be very hard to do if you are used to abusing alcohol. If you begin to have withdrawal symptoms, talk with your health care provider or a person that you trust. These symptoms may include anxiety, shaky hands, headache, nausea, sweating, or not being able to sleep. Choose to drink nonalcoholic beverages in social gatherings and places where there may be alcohol. Activity Spend more time on activities that you enjoy that do not involve alcohol, like hobbies or exercise. Find healthy ways to cope with stress, such as exercise, meditation, or spending time with people you care about. General information Talk to your family,   coworkers, and friends about supporting you in your efforts to stop drinking. If they drink, ask them not to drink around you. Spend more time with people who do not drink alcohol. If you think that you have an alcohol dependency problem: Tell friends or family about your concerns. Talk with your health care provider or another health professional about where to  get help. Work with a therapist and a chemical dependency counselor. Consider joining a support group for people who struggle with alcohol abuse and dependence. Where to find support  Your health care provider. SMART Recovery: www.smartrecovery.org Therapy and support groups Local treatment centers or chemical dependency counselors. Local AA groups in your community: www.aa.org Where to find more information Centers for Disease Control and Prevention: www.cdc.gov National Institute on Alcohol Abuse and Alcoholism: www.niaaa.nih.gov Alcoholics Anonymous (AA): www.aa.org Contact a health care provider if: You drank more or for longer than you intended on more than one occasion. You tried to stop drinking or to cut back on how much you drink, but you were not able to. You often drink to the point of vomiting or passing out. You want to drink so badly that you cannot think about anything else. You have problems in your life due to drinking, but you continue to drink. You keep drinking even though you feel anxious, depressed, or have experienced memory loss. You have stopped doing the things you used to enjoy in order to drink. You have to drink more than you used to in order to get the effect you want. You experience anxiety, sweating, nausea, shakiness, and trouble sleeping when you try to stop drinking. Get help right away if: You have thoughts about hurting yourself or others. You have serious withdrawal symptoms, including: Confusion. Racing heart. High blood pressure. Fever. If you ever feel like you may hurt yourself or others, or have thoughts about taking your own life, get help right away. You can go to your nearest emergency department or call: Your local emergency services (911 in the [place] A suicide crisis helpline, such as the National Suicide Prevention Lifeline at 1-800-273-8255 or 988 in the [place] This is open 24 hours a day. Summary Alcohol abuse and dependence can  have a negative effect on your life. Drinking too much or too often can lead to addiction. If you drink alcohol, limit how much you use. If you are having trouble keeping your drinking under control, find ways to change your behavior. Hobbies, calming activities, exercise, or support groups can help. If you feel you need help with changing your drinking habits, talk with your health care provider, a good friend, or a therapist, or go to an AA group. This information is not intended to replace advice given to you by your health care provider. Make sure you discuss any questions you have with your health care provider. Document Revised: 09/18/2020 Document Reviewed: 05/03/2018 Elsevier Patient Education  2022 [employer]  

## 2021-04-29 LAB — MICROALBUMIN / CREATININE URINE RATIO
Creatinine, Urine: 50.1 mg/dL
Microalb/Creat Ratio: 9 mg/g creat (ref 0–29)
Microalbumin, Urine: 4.6 ug/mL

## 2021-05-01 ENCOUNTER — Other Ambulatory Visit: Payer: Self-pay | Admitting: Nurse Practitioner

## 2021-05-01 DIAGNOSIS — E119 Type 2 diabetes mellitus without complications: Secondary | ICD-10-CM

## 2021-05-01 DIAGNOSIS — Z794 Long term (current) use of insulin: Secondary | ICD-10-CM

## 2021-05-03 LAB — TOXASSURE SELECT 13 (MW), URINE

## 2021-05-15 ENCOUNTER — Other Ambulatory Visit: Payer: Self-pay | Admitting: Nurse Practitioner

## 2021-05-15 DIAGNOSIS — I4819 Other persistent atrial fibrillation: Secondary | ICD-10-CM

## 2021-05-18 NOTE — Progress Notes (Unsigned)
Cardiology Office Note:    Date:  05/18/2021   ID:  Thomas Medici., DOB 20-Jan-1956, MRN [id number]  PCP:  Chevis Pretty, FNP  Cardiologist:  None  Electrophysiologist:  Vickie Epley, MD   Referring MD: Hassell Done, [name], *   No chief complaint on file.   History of Present Illness:    Thomas Nephew. is a 66 y.o. male with a hx of alcohol abuse, tobacco abuse, COPD, perforated gastric ulcer status postrepair in 2016, T2DM, hypertension, PAD, MVP, atrial fibrillation/flutter, systolic heart failure who presents for follow-up.  He was hospitalized September 2022 with acute pancreatitis found to be in A. fib.  Echo at that time showed EF 30 to 35%.  He left AMA.  At follow-up with his PCP 12/17/2020 he was still in A. fib and started on Xarelto and diltiazem.  He was referred to A. fib clinic, seen on 12/25/2020.  He underwent cardioversion on 01/20/2021 with successful restoration of sinus rhythm.  Echocardiogram 08/26/2017 showed EF 55 to 60%, mild mitral valve prolapse.  Echo 12/05/2020 showed EF 30 to 35%, global hypokinesis, and low normal RV function, moderate left atrial enlargement, trivial MR.  Zio patch x3 days on 02/10/2021 showed high percent A. fib burden with average rate 95 bpm.   Since last clinic visit,  ***lisinopril,?losartan, echo  he reports that he has been doing okay.  Has continued to have shortness of breath.  Denies any chest pain.  Reports occasional lightheadedness, denies any syncope.  Denies any palpitations.  Continues to smoke 2 packs/day.  He is taking Xarelto, denies any bleeding issues.   Past Medical History:  Diagnosis Date   Arthritis    Bladder cancer ([place])    Chronic back pain    COPD (chronic obstructive pulmonary disease) (HCC)    DDD (degenerative disc disease)    Diabetic retinopathy of both eyes (HCC)    Essential hypertension    GERD (gastroesophageal reflux disease)    History of atrial flutter 02/2011    Converted to NSR with Cardizem   History of chronic bronchitis    History of hemolytic anemia 02/2011   secondary to Avelox   HOH (hard of hearing)    HOH (hard of hearing)    no eardrum and nerve damage on R, also HOH on L   Mitral valve prolapse    a. 2D Echo 11/27/14: EF 55-60%; images were inadequate for LV wall motion assessment, + mild late systolic mitral valve prolapse involving the anterior leaflet.   PAD (peripheral artery disease) ([place]) 04/2014   Dr Trula Slade; bilateral SFA occlusion, R mid, L distal   PAF (paroxysmal atrial fibrillation) ([place])    a. Dx 11/2014 during admission for perf ulcer.   Perforated ulcer ([place])    a. 11/2014 s/p surgery.   Productive cough    Smokers' cough ([place])    Type 1 diabetes mellitus ([place]) 1977    Past Surgical History:  Procedure Laterality Date   CARDIOVERSION N/A 01/20/2021   Procedure: CARDIOVERSION;  Surgeon: Fay Records, MD;  Location: [facility];  Service: Cardiovascular;  Laterality: N/A;   CYSTOSCOPY WITH URETEROSCOPY Right 08/14/2013   Procedure: CYSTOSCOPY WITH URETEROSCOPY BLADDER BIOPSY ;  Surgeon: Claybon Jabs, MD;  Location: [facility];  Service: Urology;  Laterality: Right;   ESOPHAGOGASTRODUODENOSCOPY N/A 02/06/2013   Procedure: ESOPHAGOGASTRODUODENOSCOPY (EGD);  Surgeon: Lafayette Dragon, MD;  Location: [facility] END[facility]COPY;  Service: Endoscopy;  Laterality: N/A;   LAPAROSCOPY N/A  11/25/2014   Procedure: LAPAROSCOPIC PRIMARY REPAIR OF PERFORATED PREPYLORIC ULCER WITH Silvestre Gunner;  Surgeon: Greer Pickerel, MD;  Location: [facility];  Service: General;  Laterality: N/A;   TONSILLECTOMY  as child   TRANSTHORACIC ECHOCARDIOGRAM  02-17-2011   MODERATE LVH/  EF 65%   TRANSURETHRAL RESECTION OF BLADDER TUMOR WITH GYRUS (TURBT-GYRUS) N/A 06/12/2013   Procedure: TRANSURETHRAL RESECTION OF BLADDER TUMOR WITH GYRUS (TURBT-GYRUS);  Surgeon: Claybon Jabs, MD;  Location: [facility];  Service: Urology;  Laterality: N/A;    TYMPANIC MEMBRANE REPAIR  as child    Current Medications: No outpatient medications have been marked as taking for the 05/20/21 encounter (Appointment) with Donato Heinz, MD.     Allergies:   Azithromycin, Avelox [moxifloxacin hcl in nacl], and Bactrim [sulfamethoxazole-trimethoprim]   Social History   Socioeconomic History   Marital status: Married    Spouse name: Not on file   Number of children: Not on file   Years of education: Not on file   Highest education level: Not on file  Occupational History   Occupation: Tobacco Farmer  Tobacco Use   Smoking status: Every Day    Packs/day: 2.00    Years: 30.00    Pack years: 60.00    Types: Cigarettes   Smokeless tobacco: Former    Quit date: 06/08/1978   Tobacco comments:    Couple packs per day 12/25/2020  Vaping Use   Vaping Use: Never used  Substance and Sexual Activity   Alcohol use: Not Currently    Comment: 5 quarts per week   Drug use: No   Sexual activity: Not on file  Other Topics Concern   Not on file  Social History Narrative   Lives in [place] with wife and 2 sons.    Social Determinants of Health   Financial Resource Strain: Not on file  Food Insecurity: Not on file  Transportation Needs: Not on file  Physical Activity: Not on file  Stress: Not on file  Social Connections: Not on file     Family History: The patient's family history includes Breast cancer in his mother; Cancer in his mother; Diabetes in his son; Heart attack in his father; Heart disease in his father; Rheumatic fever in his father.  ROS:   Please see the history of present illness.     All other systems reviewed and are negative.  EKGs/Labs/Other Studies Reviewed:    The following studies were reviewed today:   EKG:   03/14/21:NSR, ST depression<1 mm in inferior leads and V4-6, rate 61   Recent Labs: 12/17/2020: ALT 12; BNP 200.0 01/08/2021: Hemoglobin 14.3; Platelets 246 04/09/2021: BUN 13; Creatinine, Ser 1.01;  Magnesium 2.0; Potassium 4.3; Sodium 136  Recent Lipid Panel    Component Value Date/Time   CHOL 121 10/25/2020 1442   TRIG 143 10/25/2020 1442   HDL 54 10/25/2020 1442   CHOLHDL 2.2 10/25/2020 1442   LDLCALC 43 10/25/2020 1442    Physical Exam:    VS:  There were no vitals taken for this visit.    Wt Readings from Last 3 Encounters:  04/28/21 168 lb 9.6 oz (76.5 kg)  04/17/21 164 lb (74.4 kg)  03/26/21 172 lb 6.4 oz (78.2 kg)     GEN:  Well nourished, well developed in no acute distress HEENT: Normal NECK: No JVD; No carotid bruits LYMPHATICS: No lymphadenopathy CARDIAC: Irregular, tachycardic, no murmurs, rubs, gallops RESPIRATORY:  Clear to auscultation without rales, wheezing or rhonchi  ABDOMEN:  Soft, non-tender, non-distended MUSCULOSKELETAL:  No edema; No deformity  SKIN: Warm and dry NEUROLOGIC:  Alert and oriented x 3 PSYCHIATRIC:  Normal affect   ASSESSMENT:    No diagnosis found.   PLAN:    Persistent atrial fibrillation: CHA2DS2-VASc score 4.  Underwent successful cardioversion on 01/20/2021, but was back in A. fib at follow-up appointment on 01/24/2021.  Zio patch x3 days on 02/10/2021 showed 100% percent A. fib burden with average rate 95 bpm.  He is back in sinus rhythm today. -Continue Xarelto 20 mg daily -Continue Toprol-XL 75 mg daily -Given possible tachycardia induced cardiomyopathy, would recommend rhythm control strategy.  Referred to EP to consider ablation versus antiarrhythmic.  He was seen by Dr. Quentin Ore 03/2021, recommended ablation if has recurrent A-fib  Acute combined systolic and diastolic heart failure: EF 30 to 35% on echocardiogram during admission with acute pancreatitis in September 2022.  Differential diagnosis includes tachycardia induced from A. fib, alcohol abuse, or ischemia.   -Plan repeat echocardiogram since maintaining sinus rhythm.  If not improved, will plan ischemia evaluation.  Recommend abstaining from alcohol. -On Lasix  20 mg daily as needed.  Has not needed to use.  Appears euvolemic. -Continue Toprol-XL 75 mg daily -Switched from lisinopril to losartan 50 mg daily given cough -Continue Jardiance 10 mg daily.  Check BMET   Alcohol abuse: Counseled risk of alcohol abuse and cessation recommended.  He reports no alcohol since admission September 2022  Tobacco use: Patient counseled on the risk of tobacco use and cessation strongly encouraged  Hypertension: Continue Toprol-XL, losartan  T2DM: On insulin  RTC in ***   Medication Adjustments/Labs and Tests Ordered: Current medicines are reviewed at length with the patient today.  Concerns regarding medicines are outlined above.  No orders of the defined types were placed in this encounter.   No orders of the defined types were placed in this encounter.   There are no Patient Instructions on file for this visit.   Signed, Donato Heinz, MD  05/18/2021 4:20 PM    [facility]

## 2021-05-20 ENCOUNTER — Other Ambulatory Visit: Payer: Self-pay

## 2021-05-20 ENCOUNTER — Encounter: Payer: Self-pay | Admitting: Cardiology

## 2021-05-20 ENCOUNTER — Ambulatory Visit: Payer: BC Managed Care – PPO | Admitting: Cardiology

## 2021-05-20 VITALS — BP 138/56 | HR 61 | Ht 70.0 in | Wt 171.0 lb

## 2021-05-20 DIAGNOSIS — I5041 Acute combined systolic (congestive) and diastolic (congestive) heart failure: Secondary | ICD-10-CM | POA: Diagnosis not present

## 2021-05-20 DIAGNOSIS — I10 Essential (primary) hypertension: Secondary | ICD-10-CM

## 2021-05-20 DIAGNOSIS — I4819 Other persistent atrial fibrillation: Secondary | ICD-10-CM | POA: Diagnosis not present

## 2021-05-20 DIAGNOSIS — Z720 Tobacco use: Secondary | ICD-10-CM

## 2021-05-20 NOTE — Patient Instructions (Signed)
Medication Instructions:  ?PLEASE CALL WHEN YOU GET HOME TO REPORT DAILY MEDICATIONS ? ?*If you need a refill on your cardiac medications before your next appointment, please call your pharmacy* ? ?Testing/Procedures: ?Your physician has requested that you have an echocardiogram. Echocardiography is a painless test that uses sound waves to create images of your heart. It provides your doctor with information about the size and shape of your heart and how well your heart?s chambers and valves are working. This procedure takes approximately one hour. There are no restrictions for this procedure. ?This will be done at our [facility] location:  [address] ? ?Follow-Up: ?At [facility], you and your health needs are our priority.  As part of our continuing mission to provide you with exceptional heart care, we have created designated Provider Care Teams.  These Care Teams include your primary Cardiologist (physician) and Advanced Practice Providers (APPs -  Physician Assistants and Nurse Practitioners) who all work together to provide you with the care you need, when you need it. ? ?We recommend signing up for the patient portal called "MyChart".  Sign up information is provided on this After Visit Summary.  MyChart is used to connect with patients for Virtual Visits (Telemedicine).  Patients are able to view lab/test results, encounter notes, upcoming appointments, etc.  Non-urgent messages can be sent to your provider as well.   ?To learn more about what you can do with MyChart, go to [email or web address].   ? ?Your next appointment:   ?2-3 month(s) ? ?The format for your next appointment:   ?In Person ? ?Provider:   ?Donato Heinz, MD   ? ? ? ?

## 2021-05-21 ENCOUNTER — Telehealth: Payer: Self-pay | Admitting: Cardiology

## 2021-05-21 ENCOUNTER — Ambulatory Visit (HOSPITAL_COMMUNITY): Payer: BC Managed Care – PPO | Attending: Cardiovascular Disease

## 2021-05-21 DIAGNOSIS — I5041 Acute combined systolic (congestive) and diastolic (congestive) heart failure: Secondary | ICD-10-CM | POA: Diagnosis not present

## 2021-05-21 LAB — ECHOCARDIOGRAM COMPLETE
Area-P 1/2: 2.83 cm2
Calc EF: 58.9 %
S' Lateral: 2.7 cm
Single Plane A2C EF: 61.6 %
Single Plane A4C EF: 58.8 %

## 2021-05-21 NOTE — Telephone Encounter (Signed)
New Message: ? ? ? ?Wife is calling to let Dr Gilman Schmidt know the name of the medicine that was missing from the patient's medication list yesterday. She said it was his Losartan Potassium 50 mg 1 tablet one time a day. ?

## 2021-05-21 NOTE — Telephone Encounter (Signed)
Losartan added to chart ?

## 2021-06-03 ENCOUNTER — Other Ambulatory Visit: Payer: Self-pay | Admitting: Nurse Practitioner

## 2021-06-03 ENCOUNTER — Telehealth: Payer: Self-pay | Admitting: Nurse Practitioner

## 2021-06-03 DIAGNOSIS — Z794 Long term (current) use of insulin: Secondary | ICD-10-CM

## 2021-06-03 NOTE — Telephone Encounter (Signed)
Stayed on hold with [facility] never got anyone left message for them to call back and to let us know what was going on with patient lantus. ?

## 2021-06-03 NOTE — Telephone Encounter (Signed)
[facility] that [facility] told [facility] that his ins will not cover insulin glargine (LANTUS) 100 UNIT/ML injection. Pt calling office for [place] to call [facility] to see what is going on. Pt says that he has been on insulin glargine (LANTUS) 100 UNIT/ML injection for a long time and has been working for me. Please call back ?

## 2021-06-11 ENCOUNTER — Telehealth: Payer: Self-pay | Admitting: *Deleted

## 2021-06-11 NOTE — Telephone Encounter (Signed)
PA for Lantus-In Process ? ?Key: [id number]) - [id number] ? ?Your information has been submitted to [place]. [employer] [place] will review the request and notify you of the determination decision directly, typically within 72 hours of receiving all information. ? ?You will also receive your request decision electronically. To check for an update later, open this request again from your dashboard. ? ?If [employer] has not responded within the specified timeframe or if you have any questions about your PA submission, contact [place] directly at [phone]. ?

## 2021-06-19 LAB — HM DIABETES EYE EXAM

## 2021-06-20 NOTE — Telephone Encounter (Signed)
Additional information on mmm desk to sign once she returns.  ? ?

## 2021-06-23 NOTE — Telephone Encounter (Signed)
Denied - fax came over = reason for Denial:  ?The request does not meet the definition of medical necessity found in the members benefit booklet.  ? ? ? ?

## 2021-07-15 ENCOUNTER — Other Ambulatory Visit: Payer: Self-pay | Admitting: Family Medicine

## 2021-07-15 DIAGNOSIS — I10 Essential (primary) hypertension: Secondary | ICD-10-CM

## 2021-07-16 ENCOUNTER — Encounter (HOSPITAL_COMMUNITY): Payer: Self-pay | Admitting: Emergency Medicine

## 2021-07-16 ENCOUNTER — Emergency Department (HOSPITAL_COMMUNITY): Payer: BC Managed Care – PPO

## 2021-07-16 ENCOUNTER — Other Ambulatory Visit: Payer: Self-pay

## 2021-07-16 ENCOUNTER — Inpatient Hospital Stay (HOSPITAL_COMMUNITY)
Admission: EM | Admit: 2021-07-16 | Discharge: 2021-07-19 | DRG: 177 | Discharge status: Against Medical Advice | Payer: BC Managed Care – PPO | Attending: Family Medicine | Admitting: Family Medicine

## 2021-07-16 DIAGNOSIS — Z803 Family history of malignant neoplasm of breast: Secondary | ICD-10-CM

## 2021-07-16 DIAGNOSIS — E871 Hypo-osmolality and hyponatremia: Secondary | ICD-10-CM | POA: Diagnosis not present

## 2021-07-16 DIAGNOSIS — F1020 Alcohol dependence, uncomplicated: Secondary | ICD-10-CM | POA: Diagnosis present

## 2021-07-16 DIAGNOSIS — Z7951 Long term (current) use of inhaled steroids: Secondary | ICD-10-CM

## 2021-07-16 DIAGNOSIS — I4891 Unspecified atrial fibrillation: Secondary | ICD-10-CM | POA: Diagnosis not present

## 2021-07-16 DIAGNOSIS — Z8551 Personal history of malignant neoplasm of bladder: Secondary | ICD-10-CM | POA: Diagnosis not present

## 2021-07-16 DIAGNOSIS — R0902 Hypoxemia: Secondary | ICD-10-CM | POA: Diagnosis not present

## 2021-07-16 DIAGNOSIS — Z794 Long term (current) use of insulin: Secondary | ICD-10-CM | POA: Diagnosis not present

## 2021-07-16 DIAGNOSIS — J690 Pneumonitis due to inhalation of food and vomit: Secondary | ICD-10-CM | POA: Diagnosis not present

## 2021-07-16 DIAGNOSIS — J9601 Acute respiratory failure with hypoxia: Secondary | ICD-10-CM | POA: Diagnosis not present

## 2021-07-16 DIAGNOSIS — K255 Chronic or unspecified gastric ulcer with perforation: Secondary | ICD-10-CM | POA: Diagnosis present

## 2021-07-16 DIAGNOSIS — F17210 Nicotine dependence, cigarettes, uncomplicated: Secondary | ICD-10-CM | POA: Diagnosis present

## 2021-07-16 DIAGNOSIS — I152 Hypertension secondary to endocrine disorders: Secondary | ICD-10-CM | POA: Diagnosis not present

## 2021-07-16 DIAGNOSIS — E1151 Type 2 diabetes mellitus with diabetic peripheral angiopathy without gangrene: Secondary | ICD-10-CM | POA: Diagnosis present

## 2021-07-16 DIAGNOSIS — E1159 Type 2 diabetes mellitus with other circulatory complications: Secondary | ICD-10-CM | POA: Diagnosis present

## 2021-07-16 DIAGNOSIS — J439 Emphysema, unspecified: Secondary | ICD-10-CM | POA: Diagnosis present

## 2021-07-16 DIAGNOSIS — I509 Heart failure, unspecified: Secondary | ICD-10-CM | POA: Diagnosis not present

## 2021-07-16 DIAGNOSIS — Z20822 Contact with and (suspected) exposure to covid-19: Secondary | ICD-10-CM | POA: Diagnosis not present

## 2021-07-16 DIAGNOSIS — J4489 Other specified chronic obstructive pulmonary disease: Secondary | ICD-10-CM | POA: Diagnosis present

## 2021-07-16 DIAGNOSIS — I5032 Chronic diastolic (congestive) heart failure: Secondary | ICD-10-CM | POA: Diagnosis not present

## 2021-07-16 DIAGNOSIS — Z789 Other specified health status: Secondary | ICD-10-CM | POA: Diagnosis present

## 2021-07-16 DIAGNOSIS — E11319 Type 2 diabetes mellitus with unspecified diabetic retinopathy without macular edema: Secondary | ICD-10-CM | POA: Diagnosis not present

## 2021-07-16 DIAGNOSIS — E119 Type 2 diabetes mellitus without complications: Secondary | ICD-10-CM | POA: Diagnosis not present

## 2021-07-16 DIAGNOSIS — Z833 Family history of diabetes mellitus: Secondary | ICD-10-CM

## 2021-07-16 DIAGNOSIS — Z7901 Long term (current) use of anticoagulants: Secondary | ICD-10-CM

## 2021-07-16 DIAGNOSIS — J13 Pneumonia due to Streptococcus pneumoniae: Secondary | ICD-10-CM | POA: Diagnosis present

## 2021-07-16 DIAGNOSIS — R112 Nausea with vomiting, unspecified: Secondary | ICD-10-CM | POA: Diagnosis present

## 2021-07-16 DIAGNOSIS — M545 Low back pain, unspecified: Secondary | ICD-10-CM | POA: Diagnosis not present

## 2021-07-16 DIAGNOSIS — E869 Volume depletion, unspecified: Secondary | ICD-10-CM | POA: Diagnosis present

## 2021-07-16 DIAGNOSIS — J189 Pneumonia, unspecified organism: Secondary | ICD-10-CM | POA: Diagnosis not present

## 2021-07-16 DIAGNOSIS — G8929 Other chronic pain: Secondary | ICD-10-CM | POA: Diagnosis present

## 2021-07-16 DIAGNOSIS — R945 Abnormal results of liver function studies: Secondary | ICD-10-CM | POA: Diagnosis not present

## 2021-07-16 DIAGNOSIS — I5042 Chronic combined systolic (congestive) and diastolic (congestive) heart failure: Secondary | ICD-10-CM | POA: Diagnosis present

## 2021-07-16 DIAGNOSIS — K219 Gastro-esophageal reflux disease without esophagitis: Secondary | ICD-10-CM | POA: Diagnosis present

## 2021-07-16 DIAGNOSIS — J449 Chronic obstructive pulmonary disease, unspecified: Secondary | ICD-10-CM | POA: Diagnosis not present

## 2021-07-16 DIAGNOSIS — Z9109 Other allergy status, other than to drugs and biological substances: Secondary | ICD-10-CM

## 2021-07-16 DIAGNOSIS — I4819 Other persistent atrial fibrillation: Secondary | ICD-10-CM | POA: Diagnosis present

## 2021-07-16 DIAGNOSIS — K828 Other specified diseases of gallbladder: Secondary | ICD-10-CM | POA: Diagnosis not present

## 2021-07-16 DIAGNOSIS — Z881 Allergy status to other antibiotic agents status: Secondary | ICD-10-CM

## 2021-07-16 DIAGNOSIS — I10 Essential (primary) hypertension: Secondary | ICD-10-CM | POA: Diagnosis present

## 2021-07-16 DIAGNOSIS — I4892 Unspecified atrial flutter: Secondary | ICD-10-CM | POA: Diagnosis present

## 2021-07-16 DIAGNOSIS — Z9981 Dependence on supplemental oxygen: Secondary | ICD-10-CM

## 2021-07-16 DIAGNOSIS — Z8249 Family history of ischemic heart disease and other diseases of the circulatory system: Secondary | ICD-10-CM

## 2021-07-16 DIAGNOSIS — N179 Acute kidney failure, unspecified: Secondary | ICD-10-CM | POA: Diagnosis not present

## 2021-07-16 DIAGNOSIS — D696 Thrombocytopenia, unspecified: Secondary | ICD-10-CM | POA: Diagnosis present

## 2021-07-16 DIAGNOSIS — E8809 Other disorders of plasma-protein metabolism, not elsewhere classified: Secondary | ICD-10-CM | POA: Diagnosis present

## 2021-07-16 DIAGNOSIS — R531 Weakness: Secondary | ICD-10-CM | POA: Diagnosis not present

## 2021-07-16 DIAGNOSIS — J40 Bronchitis, not specified as acute or chronic: Secondary | ICD-10-CM | POA: Diagnosis present

## 2021-07-16 DIAGNOSIS — M549 Dorsalgia, unspecified: Secondary | ICD-10-CM | POA: Diagnosis present

## 2021-07-16 DIAGNOSIS — I248 Other forms of acute ischemic heart disease: Secondary | ICD-10-CM | POA: Diagnosis not present

## 2021-07-16 DIAGNOSIS — F1090 Alcohol use, unspecified, uncomplicated: Secondary | ICD-10-CM | POA: Diagnosis present

## 2021-07-16 DIAGNOSIS — Z5329 Procedure and treatment not carried out because of patient's decision for other reasons: Secondary | ICD-10-CM | POA: Diagnosis not present

## 2021-07-16 DIAGNOSIS — Z7984 Long term (current) use of oral hypoglycemic drugs: Secondary | ICD-10-CM

## 2021-07-16 DIAGNOSIS — Z79899 Other long term (current) drug therapy: Secondary | ICD-10-CM

## 2021-07-16 DIAGNOSIS — E1169 Type 2 diabetes mellitus with other specified complication: Secondary | ICD-10-CM | POA: Diagnosis present

## 2021-07-16 DIAGNOSIS — I484 Atypical atrial flutter: Secondary | ICD-10-CM | POA: Diagnosis not present

## 2021-07-16 DIAGNOSIS — Z882 Allergy status to sulfonamides status: Secondary | ICD-10-CM

## 2021-07-16 DIAGNOSIS — M1990 Unspecified osteoarthritis, unspecified site: Secondary | ICD-10-CM | POA: Diagnosis present

## 2021-07-16 HISTORY — DX: Chronic combined systolic (congestive) and diastolic (congestive) heart failure: I50.42

## 2021-07-16 LAB — CBC WITH DIFFERENTIAL/PLATELET
Abs Immature Granulocytes: 0.04 10*3/uL (ref 0.00–0.07)
Basophils Absolute: 0.1 10*3/uL (ref 0.0–0.1)
Basophils Relative: 1 %
Eosinophils Absolute: 0 10*3/uL (ref 0.0–0.5)
Eosinophils Relative: 0 %
HCT: 47.4 % (ref 39.0–52.0)
Hemoglobin: 16.4 g/dL (ref 13.0–17.0)
Immature Granulocytes: 1 %
Lymphocytes Relative: 3 %
Lymphs Abs: 0.2 10*3/uL — ABNORMAL LOW (ref 0.7–4.0)
MCH: 36.9 pg — ABNORMAL HIGH (ref 26.0–34.0)
MCHC: 34.6 g/dL (ref 30.0–36.0)
MCV: 106.5 fL — ABNORMAL HIGH (ref 80.0–100.0)
Monocytes Absolute: 0.3 10*3/uL (ref 0.1–1.0)
Monocytes Relative: 4 %
Neutro Abs: 8.1 10*3/uL — ABNORMAL HIGH (ref 1.7–7.7)
Neutrophils Relative %: 91 %
Platelets: 150 10*3/uL (ref 150–400)
RBC: 4.45 MIL/uL (ref 4.22–5.81)
RDW: 13.7 % (ref 11.5–15.5)
WBC: 8.8 10*3/uL (ref 4.0–10.5)
nRBC: 0 % (ref 0.0–0.2)

## 2021-07-16 LAB — COMPREHENSIVE METABOLIC PANEL
ALT: 11 U/L (ref 0–44)
AST: 23 U/L (ref 15–41)
Albumin: 2.7 g/dL — ABNORMAL LOW (ref 3.5–5.0)
Alkaline Phosphatase: 41 U/L (ref 38–126)
Anion gap: 13 (ref 5–15)
BUN: 20 mg/dL (ref 8–23)
CO2: 23 mmol/L (ref 22–32)
Calcium: 8.6 mg/dL — ABNORMAL LOW (ref 8.9–10.3)
Chloride: 93 mmol/L — ABNORMAL LOW (ref 98–111)
Creatinine, Ser: 1.6 mg/dL — ABNORMAL HIGH (ref 0.61–1.24)
GFR, Estimated: 48 mL/min — ABNORMAL LOW (ref >60)
Glucose, Bld: 183 mg/dL — ABNORMAL HIGH (ref 70–99)
Potassium: 4.1 mmol/L (ref 3.5–5.1)
Sodium: 129 mmol/L — ABNORMAL LOW (ref 135–145)
Total Bilirubin: 2.7 mg/dL — ABNORMAL HIGH (ref 0.3–1.2)
Total Protein: 6.1 g/dL — ABNORMAL LOW (ref 6.5–8.1)

## 2021-07-16 LAB — CBG MONITORING, ED: Glucose-Capillary: 167 mg/dL — ABNORMAL HIGH (ref 70–99)

## 2021-07-16 LAB — TROPONIN I (HIGH SENSITIVITY)
Troponin I (High Sensitivity): 41 ng/L — ABNORMAL HIGH (ref <18)
Troponin I (High Sensitivity): 51 ng/L — ABNORMAL HIGH (ref <18)

## 2021-07-16 LAB — RESP PANEL BY RT-PCR (FLU A&B, COVID) ARPGX2
Influenza A by PCR: NEGATIVE
Influenza B by PCR: NEGATIVE
SARS Coronavirus 2 by RT PCR: NEGATIVE

## 2021-07-16 LAB — BRAIN NATRIURETIC PEPTIDE: B Natriuretic Peptide: 950.1 pg/mL — ABNORMAL HIGH (ref 0.0–100.0)

## 2021-07-16 LAB — LACTIC ACID, PLASMA
Lactic Acid, Venous: 3.5 mmol/L (ref 0.5–1.9)
Lactic Acid, Venous: 3.7 mmol/L (ref 0.5–1.9)

## 2021-07-16 LAB — LIPASE, BLOOD: Lipase: 16 U/L (ref 11–51)

## 2021-07-16 MED ORDER — LEVALBUTEROL HCL 0.63 MG/3ML IN NEBU: 0.6300 mg | INHALATION_SOLUTION | Freq: Four times a day (QID) | RESPIRATORY_TRACT | Status: DC | PRN

## 2021-07-16 MED ORDER — METOPROLOL TARTRATE 25 MG PO TABS
25.0000 mg | ORAL_TABLET | Freq: Three times a day (TID) | ORAL | Status: DC
  Administered 2021-07-16 – 2021-07-17 (×2): 25 mg via ORAL
  Filled 2021-07-16 (×2): qty 1

## 2021-07-16 MED ORDER — SENNOSIDES-DOCUSATE SODIUM 8.6-50 MG PO TABS: 1.0000 | ORAL_TABLET | Freq: Every evening | ORAL | Status: DC | PRN

## 2021-07-16 MED ORDER — LACTATED RINGERS IV BOLUS
1000.0000 mL | Freq: Once | INTRAVENOUS | Status: AC
  Administered 2021-07-16: 1000 mL via INTRAVENOUS

## 2021-07-16 MED ORDER — SODIUM CHLORIDE 0.9 % IV SOLN
1.0000 g | Freq: Once | INTRAVENOUS | Status: AC
Start: 2021-07-16 — End: 2021-07-16
  Administered 2021-07-16: 1 g via INTRAVENOUS
  Filled 2021-07-16: qty 10

## 2021-07-16 MED ORDER — ADULT MULTIVITAMIN W/MINERALS CH
1.0000 | ORAL_TABLET | Freq: Every day | ORAL | Status: DC
  Administered 2021-07-17 – 2021-07-19 (×3): 1 via ORAL
  Filled 2021-07-16 (×3): qty 1

## 2021-07-16 MED ORDER — PROCHLORPERAZINE EDISYLATE 10 MG/2ML IJ SOLN
5.0000 mg | Freq: Once | INTRAMUSCULAR | Status: AC
  Administered 2021-07-16: 5 mg via INTRAVENOUS
  Filled 2021-07-16: qty 2

## 2021-07-16 MED ORDER — SODIUM CHLORIDE 0.9 % IV SOLN
3.0000 g | Freq: Four times a day (QID) | INTRAVENOUS | Status: DC
  Administered 2021-07-16 – 2021-07-17 (×3): 3 g via INTRAVENOUS
  Filled 2021-07-16 (×6): qty 8

## 2021-07-16 MED ORDER — INSULIN ASPART 100 UNIT/ML IJ SOLN
0.0000 [IU] | Freq: Three times a day (TID) | INTRAMUSCULAR | Status: DC
  Administered 2021-07-17 (×2): 3 [IU] via SUBCUTANEOUS
  Administered 2021-07-17 – 2021-07-18 (×2): 5 [IU] via SUBCUTANEOUS
  Administered 2021-07-18: 11 [IU] via SUBCUTANEOUS
  Administered 2021-07-18: 5 [IU] via SUBCUTANEOUS
  Administered 2021-07-19: 15 [IU] via SUBCUTANEOUS
  Administered 2021-07-19: 5 [IU] via SUBCUTANEOUS

## 2021-07-16 MED ORDER — LORAZEPAM 2 MG/ML IJ SOLN: 1.0000 mg | INTRAMUSCULAR | Status: DC | PRN

## 2021-07-16 MED ORDER — UMECLIDINIUM BROMIDE 62.5 MCG/ACT IN AEPB
1.0000 | INHALATION_SPRAY | Freq: Every day | RESPIRATORY_TRACT | Status: DC
Start: 2021-07-17 — End: 2021-07-19
  Administered 2021-07-18 – 2021-07-19 (×2): 1 via RESPIRATORY_TRACT
  Filled 2021-07-16: qty 7

## 2021-07-16 MED ORDER — SODIUM CHLORIDE 0.9% FLUSH
3.0000 mL | Freq: Two times a day (BID) | INTRAVENOUS | Status: DC
  Administered 2021-07-16 – 2021-07-19 (×6): 3 mL via INTRAVENOUS

## 2021-07-16 MED ORDER — ONDANSETRON HCL 4 MG/2ML IJ SOLN: 4.0000 mg | Freq: Four times a day (QID) | INTRAMUSCULAR | Status: DC | PRN

## 2021-07-16 MED ORDER — ACETAMINOPHEN 325 MG PO TABS
650.0000 mg | ORAL_TABLET | Freq: Four times a day (QID) | ORAL | Status: DC | PRN
Start: 2021-07-16 — End: 2021-07-19
  Administered 2021-07-17: 650 mg via ORAL
  Filled 2021-07-16: qty 2

## 2021-07-16 MED ORDER — ONDANSETRON HCL 4 MG PO TABS: 4.0000 mg | ORAL_TABLET | Freq: Four times a day (QID) | ORAL | Status: DC | PRN

## 2021-07-16 MED ORDER — FLUTICASONE FUROATE-VILANTEROL 100-25 MCG/ACT IN AEPB
1.0000 | INHALATION_SPRAY | Freq: Every day | RESPIRATORY_TRACT | Status: DC
Start: 2021-07-17 — End: 2021-07-19
  Administered 2021-07-18 – 2021-07-19 (×2): 1 via RESPIRATORY_TRACT
  Filled 2021-07-16: qty 28

## 2021-07-16 MED ORDER — LACTATED RINGERS IV BOLUS
500.0000 mL | Freq: Once | INTRAVENOUS | Status: AC
  Administered 2021-07-16: 500 mL via INTRAVENOUS

## 2021-07-16 MED ORDER — HEPARIN (PORCINE) 25000 UT/250ML-% IV SOLN
1300.0000 [IU]/h | INTRAVENOUS | Status: AC
Start: 2021-07-16 — End: 2021-07-17
  Administered 2021-07-16: 1300 [IU]/h via INTRAVENOUS
  Filled 2021-07-16: qty 250

## 2021-07-16 MED ORDER — INSULIN ASPART 100 UNIT/ML IJ SOLN
0.0000 [IU] | Freq: Every day | INTRAMUSCULAR | Status: DC
  Administered 2021-07-17: 3 [IU] via SUBCUTANEOUS
  Administered 2021-07-18: 2 [IU] via SUBCUTANEOUS

## 2021-07-16 MED ORDER — ACETAMINOPHEN 650 MG RE SUPP: 650.0000 mg | Freq: Four times a day (QID) | RECTAL | Status: DC | PRN

## 2021-07-16 MED ORDER — HYDROCODONE-ACETAMINOPHEN 5-325 MG PO TABS: 1.0000 | ORAL_TABLET | Freq: Two times a day (BID) | ORAL | Status: DC | PRN

## 2021-07-16 MED ORDER — ONDANSETRON 4 MG PO TBDP
4.0000 mg | ORAL_TABLET | Freq: Once | ORAL | Status: AC
Start: 2021-07-16 — End: 2021-07-16
  Administered 2021-07-16: 4 mg via ORAL
  Filled 2021-07-16: qty 1

## 2021-07-16 MED ORDER — THIAMINE HCL 100 MG/ML IJ SOLN: 100.0000 mg | Freq: Every day | INTRAMUSCULAR | Status: DC

## 2021-07-16 MED ORDER — THIAMINE HCL 100 MG PO TABS
100.0000 mg | ORAL_TABLET | Freq: Every day | ORAL | Status: DC
  Administered 2021-07-17 – 2021-07-19 (×3): 100 mg via ORAL
  Filled 2021-07-16 (×3): qty 1

## 2021-07-16 MED ORDER — LORAZEPAM 1 MG PO TABS: 1.0000 mg | ORAL_TABLET | ORAL | Status: DC | PRN

## 2021-07-16 MED ORDER — FOLIC ACID 1 MG PO TABS
1.0000 mg | ORAL_TABLET | Freq: Every day | ORAL | Status: DC
  Administered 2021-07-17 – 2021-07-19 (×3): 1 mg via ORAL
  Filled 2021-07-16 (×3): qty 1

## 2021-07-16 NOTE — Assessment & Plan Note (Signed)
Mild with corrected sodium 131, suspected due to hypovolemia.  Received 1.5 L fluid so far, repeat labs in AM. ?

## 2021-07-16 NOTE — Hospital Course (Signed)
Corde Antonini. is a 66 y.o. male with medical history significant for persistent atrial fibrillation on Xarelto, chronic combined systolic and diastolic CHF (EF improved to 55-60% with G2 DD by TTE 05/21/2021), COPD, insulin-dependent diabetes, HTN, PAD, hard of hearing, chronic back pain, alcohol and tobacco use who is admitted with persistent A-fib/flutter with RVR and acute hypoxic respiratory failure due to aspiration pneumonia. ?

## 2021-07-16 NOTE — H&P (Signed)
+ ?History and Physical  ? ? ?Thomas Sandoval. LAG:[id number] DOB: 1955-04-16 DOA: 07/16/2021 ? ?PCP: Chevis Pretty, FNP  ?Patient coming from: Home ? ?I have personally briefly reviewed patient's old medical records in [place] ? ?Chief Complaint: Shortness of breath ? ?HPI: ?Thomas Sandoval. is a 66 y.o. male with medical history significant for persistent atrial fibrillation on Xarelto, chronic combined systolic and diastolic CHF (EF improved to 55-60% with G2 DD by TTE 05/21/2021), COPD, insulin-dependent diabetes, HTN, PAD, hard of hearing, chronic back pain, alcohol and tobacco use who presented to the ED for evaluation of shortness of breath. ? ?Patient reports 4 days of progressive shortness of breath with cough occasionally productive of white sputum.  He has been having nausea and vomiting for the last 3 days as well.  He reports alternating chills and diaphoresis.  He denies any chest pain, palpitations, abdominal pain, dysuria, diarrhea, peripheral edema.  He states that he has been able to keep down his medications during this time.  He says he does not usually require supplemental oxygen at home.  He reports decreased urine output recently. ? ?ED Course  Labs/Imaging on admission: I have personally reviewed following labs and imaging studies. ? ?Initial vitals showed BP 109/80, pulse 112, RR 26, temp 98.0 ?F, SPO2 88% on room air. ? ?Labs show sodium 129 (131 when corrected for hyperglycemia), potassium 4.1, bicarb 23, BUN 20, creatinine 1.60 (1.01 on 04/09/2021), serum glucose 183, WBC 8.8, hemoglobin 16.4, platelets 150,000, troponin 41 > 51, BNP 950.1, lipase 16, lactic acid 3.5. ? ?SARS-CoV-2 respiratory panel in process.  Blood cultures collected and pending. ? ?2 view chest x-ray shows extensive right mid to lower lung patchy airspace disease. ? ?Patient was given 1.5 L LR, IV ceftriaxone, IV Compazine.  EDP discussed with on-call cardiology who will see for management of  A-fib with RVR.  The hospitalist service was consulted to admit for further evaluation and management. ? ?Review of Systems: All systems reviewed and are negative except as documented in history of present illness above. ? ? ?Past Medical History:  ?Diagnosis Date  ? Arthritis   ? Bladder cancer ([place])   ? Chronic back pain   ? Chronic combined systolic and diastolic CHF (congestive heart failure) ([place]) 07/16/2021  ? COPD (chronic obstructive pulmonary disease) ([place])   ? DDD (degenerative disc disease)   ? Diabetic retinopathy of both eyes ([place])   ? Essential hypertension   ? GERD (gastroesophageal reflux disease)   ? History of atrial flutter 02/2011  ? Converted to NSR with Cardizem  ? History of chronic bronchitis   ? History of hemolytic anemia 02/2011  ? secondary to Avelox  ? HOH (hard of hearing)   ? HOH (hard of hearing)   ? no eardrum and nerve damage on R, also HOH on L  ? Mitral valve prolapse   ? a. 2D Echo 11/27/14: EF 55-60%; images were inadequate for LV wall motion assessment, + mild late systolic mitral valve prolapse involving the anterior leaflet.  ? PAD (peripheral artery disease) ([place]) 04/2014  ? Dr Trula Slade; bilateral SFA occlusion, R mid, L distal  ? PAF (paroxysmal atrial fibrillation) ([place])   ? a. Dx 11/2014 during admission for perf ulcer.  ? Perforated ulcer ([place])   ? a. 11/2014 s/p surgery.  ? Productive cough   ? Smokers' cough ([place])   ? Type 1 diabetes mellitus ([place]) 1977  ? ? ?Past Surgical History:  ?Procedure  Laterality Date  ? CARDIOVERSION N/A 01/20/2021  ? Procedure: CARDIOVERSION;  Surgeon: Fay Records, MD;  Location: [place];  Service: Cardiovascular;  Laterality: N/A;  ? CYSTOSCOPY WITH URETEROSCOPY Right 08/14/2013  ? Procedure: CYSTOSCOPY WITH URETEROSCOPY BLADDER BIOPSY ;  Surgeon: Claybon Jabs, MD;  [facility];  Service: Urology;  Laterality: Right;  ? ESOPHAGOGASTRODUODENOSCOPY N/A 02/06/2013  ? Procedure: ESOPHAGOGASTRODUODENOSCOPY (EGD);   Surgeon: Lafayette Dragon, MD;  Location: [facility] ENDOSCOPY;  Service: Endoscopy;  Laterality: N/A;  ? LAPAROSCOPY N/A 11/25/2014  ? Procedure: LAPAROSCOPIC PRIMARY REPAIR OF PERFORATED PREPYLORIC ULCER WITH Silvestre Gunner;  Surgeon: Greer Pickerel, MD;  Location: [place];  Service: General;  Laterality: N/A;  ? TONSILLECTOMY  as child  ? TRANSTHORACIC ECHOCARDIOGRAM  02-17-2011  ? MODERATE LVH/  EF 65%  ? TRANSURETHRAL RESECTION OF BLADDER TUMOR WITH GYRUS (TURBT-GYRUS) N/A 06/12/2013  ? Procedure: TRANSURETHRAL RESECTION OF BLADDER TUMOR WITH GYRUS (TURBT-GYRUS);  Surgeon: Claybon Jabs, MD;  Location: [facility];  Service: Urology;  Laterality: N/A;  ? TYMPANIC MEMBRANE REPAIR  as child  ? ? ?Social History: ? reports that he has been smoking cigarettes. He has a 60.00 pack-year smoking history. He quit smokeless tobacco use about 43 years ago. He reports that he does not currently use alcohol. He reports that he does not use drugs. ? ?Allergies  ?Allergen Reactions  ? Azithromycin Other (See Comments) and Nausea And Vomiting  ?  "Severe stomach cramps; told to list as an allergy by dr. Years ago"  ? Avelox [Moxifloxacin Hcl In Nacl] Other (See Comments)  ?  Hemolysis  In 2012  ? Bactrim [Sulfamethoxazole-Trimethoprim] Diarrhea and Nausea And Vomiting  ? ? ?Family History  ?Problem Relation Age of Onset  ? Breast cancer Mother   ? Cancer Mother   ?     Breast  ? Rheumatic fever Father   ? Heart disease Father   ? Heart attack Father   ?     Massive   ? Diabetes Son   ? ? ? ?Prior to Admission medications   ?Medication Sig Start Date End Date Taking? Authorizing Provider  ?albuterol (PROVENTIL HFA;VENTOLIN HFA) 108 (90 Base) MCG/ACT inhaler Inhale 2 puffs every 6 (six) hours as needed into the lungs for wheezing or shortness of breath. 05/17/17   Hassell Done [name], FNP  ?benzonatate (TESSALON PERLES) 100 MG capsule Take 1 capsule (100 mg total) by mouth 3 (three) times daily as needed for cough. 04/17/21   Chevis Pretty, FNP  ?cetirizine (ZYRTEC) 10 MG tablet Take 10 mg by mouth daily as needed for allergies.    [provider]  ?doxycycline (VIBRA-TABS) 100 MG tablet Take 1 tablet (100 mg total) by mouth 2 (two) times daily. 1 po bid 04/17/21   Chevis Pretty, FNP  ?empagliflozin (JARDIANCE) 10 MG TABS tablet Take 1 tablet (10 mg total) by mouth daily before breakfast. 04/01/21   Donato Heinz, MD  ?famotidine (PEPCID) 20 MG tablet Take 1 tablet (20 mg total) by mouth every morning. Reported on 04/16/2015 04/28/21   Chevis Pretty, FNP  ?Fluticasone-Umeclidin-Vilant (TRELEGY ELLIPTA) 100-62.5-25 MCG/ACT AEPB INHALE 1 PUFF INTO THE LUNGS TWICE A DAY 04/28/21   Chevis Pretty, FNP  ?furosemide (LASIX) 20 MG tablet TAKE 1 TABLET BY MOUTH EVERY DAY 07/15/21   Chevis Pretty, FNP  ?HYDROcodone-acetaminophen (NORCO) 5-325 MG tablet Take 1 tablet by mouth 2 (two) times daily. 06/27/21 07/27/21  Chevis Pretty, FNP  ?Insulin Syringe-Needle U-100 (  B-D INS SYR ULTRAFINE 1CC/30G) 30G X 1/2" 1 ML MISC Use 4 times a day with insulin Dx E11.9 08/22/20   Chevis Pretty, FNP  ?Insulin Syringes, Disposable, U-100 1 ML MISC Use 4 times a day for insulin injection Dx E11.9 12/27/18   Chevis Pretty, FNP  ?LANTUS 100 UNIT/ML injection INJECT 0.4 MLS (40 UNITS TOTAL) INTO THE SKIN IN THE MORNING. 06/04/21   Chevis Pretty, FNP  ?losartan (COZAAR) 50 MG tablet Take 50 mg by mouth daily.    [provider]  ?metoprolol succinate (TOPROL-XL) 50 MG 24 hr tablet Take 1.5 tablets (75 mg total) by mouth daily. Take with or immediately following a meal. 04/01/21 06/30/21  Donato Heinz, MD  ?NOVOLOG 100 UNIT/ML injection PER SLIDING SCALE: 190 - 200 = 2 UNITS 300 AND ABOVE = 7 UNITS 02/14/21   Chevis Pretty, FNP  ?OVER THE COUNTER MEDICATION Take 1 Scoop by mouth in the morning and at bedtime. Super Beets    [provider]  ?rivaroxaban (XARELTO) 20  MG TABS tablet Take 1 tablet (20 mg total) by mouth daily with supper. 04/28/21   Chevis Pretty, FNP  ? ? ?Physical Exam: ?Vitals:  ? 07/16/21 2130 07/16/21 2145 07/16/21 2215 07/16/21 2230  ?BP: 104/65

## 2021-07-16 NOTE — Assessment & Plan Note (Signed)
Reports ongoing alcohol use.  History of DTs.  Start on CIWA protocol with as needed Ativan. ?

## 2021-07-16 NOTE — Assessment & Plan Note (Signed)
EF improved to 55-60% on TTE 05/21/2021 from prior 35-35%.  Overall appears volume depleted on admission despite elevated BNP. ?-S/p 1.5 L LR in the ED ?-Hold further fluids ?-Hold Jardiance and Lasix ?-Monitor strict I/O's and daily weights ?-Continue metoprolol per cardiology ?-Hold losartan with AKI ?

## 2021-07-16 NOTE — ED Provider Notes (Addendum)
?[facility] ?Provider Note ? ? ?CSN: [id number] ?Arrival date & time: 07/16/21  1554 ? ?  ? ?History ? ?Chief Complaint  ?Patient presents with  ? Shortness of Breath  ? Weakness  ? ? ?Thomas Sandoval. is a 66 y.o. male. ? ? ?Shortness of Breath ?Associated symptoms: cough and vomiting   ?Associated symptoms: no abdominal pain and no chest pain   ?Weakness ?Associated symptoms: cough, nausea, shortness of breath and vomiting   ?Associated symptoms: no abdominal pain and no chest pain   ?Patient presents with multiple complaints.  Feeling bad for around the last week.  States he just felt bad all over.  Has had a cough with shortness of breath.  States he has had chills.  States that he has now had nausea and vomiting.  No swelling in his legs.  Does drink alcohol.  History of pancreatitis.  History of atrial fibrillation.  He is on Xarelto and states he has been taking it.  Feels weak all over ?  ?Past Medical History:  ?Diagnosis Date  ? Arthritis   ? Bladder cancer ([place])   ? Chronic back pain   ? COPD (chronic obstructive pulmonary disease) ([place])   ? DDD (degenerative disc disease)   ? Diabetic retinopathy of both eyes ([place])   ? Essential hypertension   ? GERD (gastroesophageal reflux disease)   ? History of atrial flutter 02/2011  ? Converted to NSR with Cardizem  ? History of chronic bronchitis   ? History of hemolytic anemia 02/2011  ? secondary to Avelox  ? HOH (hard of hearing)   ? HOH (hard of hearing)   ? no eardrum and nerve damage on R, also HOH on L  ? Mitral valve prolapse   ? a. 2D Echo 11/27/14: EF 55-60%; images were inadequate for LV wall motion assessment, + mild late systolic mitral valve prolapse involving the anterior leaflet.  ? PAD (peripheral artery disease) ([place]) 04/2014  ? Dr Trula Slade; bilateral SFA occlusion, R mid, L distal  ? PAF (paroxysmal atrial fibrillation) ([place])   ? a. Dx 11/2014 during admission for perf ulcer.  ? Perforated ulcer ([place])   ?  a. 11/2014 s/p surgery.  ? Productive cough   ? Smokers' cough ([place])   ? Type 1 diabetes mellitus ([place]) 1977  ? ?Past Surgical History:  ?Procedure Laterality Date  ? CARDIOVERSION N/A 01/20/2021  ? Procedure: CARDIOVERSION;  Surgeon: Fay Records, MD;  Location: [facility];  Service: Cardiovascular;  Laterality: N/A;  ? CYSTOSCOPY WITH URETEROSCOPY Right 08/14/2013  ? Procedure: CYSTOSCOPY WITH URETEROSCOPY BLADDER BIOPSY ;  Surgeon: Claybon Jabs, MD;  Location: [facility];  Service: Urology;  Laterality: Right;  ? ESOPHAGOGASTRODUODENOSCOPY N/A 02/06/2013  ? Procedure: ESOPHAGOGASTRODUODENOSCOPY (EGD);  Surgeon: Lafayette Dragon, MD;  Location: [facility] ENDOSCOPY;  Service: Endoscopy;  Laterality: N/A;  ? LAPAROSCOPY N/A 11/25/2014  ? Procedure: LAPAROSCOPIC PRIMARY REPAIR OF PERFORATED PREPYLORIC ULCER WITH Silvestre Gunner;  Surgeon: Greer Pickerel, MD;  Location: [facility];  Service: General;  Laterality: N/A;  ? TONSILLECTOMY  as child  ? TRANSTHORACIC ECHOCARDIOGRAM  02-17-2011  ? MODERATE LVH/  EF 65%  ? TRANSURETHRAL RESECTION OF BLADDER TUMOR WITH GYRUS (TURBT-GYRUS) N/A 06/12/2013  ? Procedure: TRANSURETHRAL RESECTION OF BLADDER TUMOR WITH GYRUS (TURBT-GYRUS);  Surgeon: Claybon Jabs, MD;  Location: [facility];  Service: Urology;  Laterality: N/A;  ? TYMPANIC MEMBRANE REPAIR  as child  ? ? ?  Home Medications ?Prior to Admission medications   ?Medication Sig Start Date End Date Taking? Authorizing Provider  ?albuterol (PROVENTIL HFA;VENTOLIN HFA) 108 (90 Base) MCG/ACT inhaler Inhale 2 puffs every 6 (six) hours as needed into the lungs for wheezing or shortness of breath. 05/17/17   Hassell Done [name], FNP  ?benzonatate (TESSALON PERLES) 100 MG capsule Take 1 capsule (100 mg total) by mouth 3 (three) times daily as needed for cough. 04/17/21   Chevis Pretty, FNP  ?cetirizine (ZYRTEC) 10 MG tablet Take 10 mg by mouth daily as needed for allergies.    [provider]  ?doxycycline  (VIBRA-TABS) 100 MG tablet Take 1 tablet (100 mg total) by mouth 2 (two) times daily. 1 po bid 04/17/21   Chevis Pretty, FNP  ?empagliflozin (JARDIANCE) 10 MG TABS tablet Take 1 tablet (10 mg total) by mouth daily before breakfast. 04/01/21   Donato Heinz, MD  ?famotidine (PEPCID) 20 MG tablet Take 1 tablet (20 mg total) by mouth every morning. Reported on 04/16/2015 04/28/21   Chevis Pretty, FNP  ?Fluticasone-Umeclidin-Vilant (TRELEGY ELLIPTA) 100-62.5-25 MCG/ACT AEPB INHALE 1 PUFF INTO THE LUNGS TWICE A DAY 04/28/21   Chevis Pretty, FNP  ?furosemide (LASIX) 20 MG tablet TAKE 1 TABLET BY MOUTH EVERY DAY 07/15/21   Chevis Pretty, FNP  ?HYDROcodone-acetaminophen (NORCO) 5-325 MG tablet Take 1 tablet by mouth 2 (two) times daily. 06/27/21 07/27/21  Chevis Pretty, FNP  ?Insulin Syringe-Needle U-100 (B-D INS SYR ULTRAFINE 1CC/30G) 30G X 1/2" 1 ML MISC Use 4 times a day with insulin Dx E11.9 08/22/20   Chevis Pretty, FNP  ?Insulin Syringes, Disposable, U-100 1 ML MISC Use 4 times a day for insulin injection Dx E11.9 12/27/18   Chevis Pretty, FNP  ?LANTUS 100 UNIT/ML injection INJECT 0.4 MLS (40 UNITS TOTAL) INTO THE SKIN IN THE MORNING. 06/04/21   Chevis Pretty, FNP  ?losartan (COZAAR) 50 MG tablet Take 50 mg by mouth daily.    [provider]  ?metoprolol succinate (TOPROL-XL) 50 MG 24 hr tablet Take 1.5 tablets (75 mg total) by mouth daily. Take with or immediately following a meal. 04/01/21 06/30/21  Donato Heinz, MD  ?NOVOLOG 100 UNIT/ML injection PER SLIDING SCALE: 190 - 200 = 2 UNITS 300 AND ABOVE = 7 UNITS 02/14/21   Chevis Pretty, FNP  ?OVER THE COUNTER MEDICATION Take 1 Scoop by mouth in the morning and at bedtime. Super Beets    [provider]  ?rivaroxaban (XARELTO) 20 MG TABS tablet Take 1 tablet (20 mg total) by mouth daily with supper. 04/28/21   Chevis Pretty, FNP  ?   ? ?Allergies    ?Azithromycin,  Avelox [moxifloxacin hcl in nacl], and Bactrim [sulfamethoxazole-trimethoprim]   ? ?Review of Systems   ?Review of Systems  ?Constitutional:  Positive for appetite change and chills.  ?HENT:  Negative for congestion.   ?Respiratory:  Positive for cough and shortness of breath.   ?Cardiovascular:  Negative for chest pain.  ?Gastrointestinal:  Positive for nausea and vomiting. Negative for abdominal pain.  ?Genitourinary:  Negative for flank pain.  ?Neurological:  Positive for weakness.  ? ?Physical Exam ?Updated Vital Signs ?BP 101/71   Pulse (!) 107   Temp (!) 97.5 ?F (36.4 ?C) (Oral)   Resp (!) 23   Ht '5\' 10"'$  (1.778 m)   Wt 81.6 kg   SpO2 93%   BMI 25.83 kg/m?  ?Physical Exam ?Vitals and nursing note reviewed.  ?HENT:  ?   Head: Normocephalic.  ?Cardiovascular:  ?  Rate and Rhythm: Tachycardia present. Rhythm irregular.  ?Pulmonary:  ?   Comments: Mildly harsh breath sounds.  Some tachypnea ?Abdominal:  ?   Tenderness: There is abdominal tenderness.  ?   Comments: Mild upper abdominal tenderness no rebound or guarding.  No hernia palpated.  ?Musculoskeletal:  ?   Cervical back: Neck supple.  ?Skin: ?   Capillary Refill: Capillary refill takes less than 2 seconds.  ?Neurological:  ?   Mental Status: He is alert.  ? ? ?ED Results / Procedures / Treatments   ?Labs ?(all labs ordered are listed, but only abnormal results are displayed) ?Labs Reviewed  ?CBC WITH DIFFERENTIAL/PLATELET - Abnormal; Notable for the following components:  ?    Result Value  ? MCV 106.5 (*)   ? MCH 36.9 (*)   ? Neutro Abs 8.1 (*)   ? Lymphs Abs 0.2 (*)   ? All other components within normal limits  ?COMPREHENSIVE METABOLIC PANEL - Abnormal; Notable for the following components:  ? Sodium 129 (*)   ? Chloride 93 (*)   ? Glucose, Bld 183 (*)   ? Creatinine, Ser 1.60 (*)   ? Calcium 8.6 (*)   ? Total Protein 6.1 (*)   ? Albumin 2.7 (*)   ? Total Bilirubin 2.7 (*)   ? GFR, Estimated 48 (*)   ? All other components within normal limits   ?BRAIN NATRIURETIC PEPTIDE - Abnormal; Notable for the following components:  ? B Natriuretic Peptide 950.1 (*)   ? All other components within normal limits  ?LACTIC ACID, PLASMA - Abnormal; Notable for the follow

## 2021-07-16 NOTE — Assessment & Plan Note (Signed)
Overall appears volume depleted.  Given 1.5 L fluid in the ED, hold further fluids at this point.  Holding Jardiance, Lasix, losartan. ?

## 2021-07-16 NOTE — Assessment & Plan Note (Signed)
Holding [place].  Start on moderate SSI with HS coverage. ?

## 2021-07-16 NOTE — Assessment & Plan Note (Signed)
Likely triggered by aspiration pneumonia.  Blood pressure has been on the softer side.  Has been on Xarelto as an outpatient although question of adherence. ?-Appreciate cardiology assistance ?-Started on metoprolol tartrate 25 mg PO TID ?-Started on IV heparin per cardiology ?

## 2021-07-16 NOTE — Assessment & Plan Note (Signed)
New hypoxia with SPO2 88% on room air on arrival secondary to aspiration pneumonia.  Not requiring supplemental O2 at home at baseline. ?-Continue antibiotics as above ?-Supplemental oxygen as needed ?

## 2021-07-16 NOTE — ED Provider Triage Note (Addendum)
Emergency Medicine Provider Triage Evaluation Note ? ?Thomas Medici. , a 66 y.o. male  was evaluated in triage.  Pt complains of feeling weak for the past week, endorses nausea and vomiting.  Denies fever, chills, chest pain.  Endorses decreased p.o. intake.  He also stated toes of his left foot were all "black" 2 to 3 days ago.  On exam today they are of normal color.  History significant for cardiac disease including A-fib on Xarelto. ? ?Review of Systems  ?Positive: As above ?Negative: As above ? ?Physical Exam  ?BP 109/88   Pulse (!) 112   Temp 98 ?F (36.7 ?C) (Oral)   Resp (!) 26   SpO2 (!) 88%  ?Gen:   Awake, no distress   ?Resp:  Normal effort  ?MSK:   Moves extremities without difficulty  ?Other:   ? ?Medical Decision Making  ?Medically screening exam initiated at 4:32 PM.  Appropriate orders placed.  Thomas Medici. was informed that the remainder of the evaluation will be completed by another provider, this initial triage assessment does not replace that evaluation, and the importance of remaining in the ED until their evaluation is complete. ? ? ?  ?Evlyn Courier, PA-C ?07/16/21 1633 ? ?  ?Evlyn Courier, PA-C ?07/16/21 1635 ? ?

## 2021-07-16 NOTE — Consult Note (Signed)
?Cardiology Consultation:  ? ?Patient ID: Thomas Sandoval. ?MRN: [id number]; DOB: 1955-09-21 ? ?Admit date: 07/16/2021 ?Date of Consult: 07/16/2021 ? ?PCP:  Chevis Pretty, FNP ?  ?[facility] Providers ?Cardiologist:  Donato Heinz, MD  ?Electrophysiologist:  Vickie Epley, MD     ? ? ?Patient Profile:  ? ?Thomas Sandoval. is a 67 y.o. male with a hx of paroxysmal atrial fibrillation on xarelto and metoprolol, HFimpEF 30 -> 60% in setting of afib RVR, COPD, EtOH use, DM, HTN who is being seen 07/16/2021 for the evaluation of atrial flutter with rapid venticular response at the request of Dr Davonna Belling. ? ?History of Present Illness:  ? ?Mr. Presley reports about one week of dyspnea, cough with clear-yellow sputum, nausea/vomiting, chills, decreased PO intake, HA, right-side rib/chest pain worse with breathing. Denies orthopnea, PND; denies volume overload. Has not needed to use home prn lasix because no LEE. His wife reports some missed medications but thinks he has been taking xarelto and metoprolol consistently  ?In the ED, found to be in atrial flutter with variable response, mostly in the 120s. Atrial flutter morphology is atypical as it is upright in both the inferior leads and V1. BP mostly 100s/60s. Workup notable for BNP 950 from 200 b/l, trop 41 -> 51, normal WBC, LA 3.5. CXR with dense RLL infiltrate most c/w PNA. Hypoxic requiring 2L [place]. Given 1500 ccs IVFs and ABX. Cardiology consulted ? ?Past Medical History:  ?Diagnosis Date  ? Arthritis   ? Bladder cancer ([place])   ? Chronic back pain   ? Chronic combined systolic and diastolic CHF (congestive heart failure) ([employer]) 07/16/2021  ? COPD (chronic obstructive pulmonary disease) ([place])   ? DDD (degenerative disc disease)   ? Diabetic retinopathy of both eyes ([place])   ? Essential hypertension   ? GERD (gastroesophageal reflux disease)   ? History of atrial flutter 02/2011  ? Converted to NSR with Cardizem  ? History of chronic  bronchitis   ? History of hemolytic anemia 02/2011  ? secondary to Avelox  ? HOH (hard of hearing)   ? HOH (hard of hearing)   ? no eardrum and nerve damage on R, also HOH on L  ? Mitral valve prolapse   ? a. 2D Echo 11/27/14: EF 55-60%; images were inadequate for LV wall motion assessment, + mild late systolic mitral valve prolapse involving the anterior leaflet.  ? PAD (peripheral artery disease) ([place]) 04/2014  ? Dr Trula Slade; bilateral SFA occlusion, R mid, L distal  ? PAF (paroxysmal atrial fibrillation) ([place])   ? a. Dx 11/2014 during admission for perf ulcer.  ? Perforated ulcer ([place])   ? a. 11/2014 s/p surgery.  ? Productive cough   ? Smokers' cough ([place])   ? Type 1 diabetes mellitus ([place]) 1977  ? ? ?Past Surgical History:  ?Procedure Laterality Date  ? CARDIOVERSION N/A 01/20/2021  ? Procedure: CARDIOVERSION;  Surgeon: Fay Records, MD;  Location: [facility];  Service: Cardiovascular;  Laterality: N/A;  ? CYSTOSCOPY WITH URETEROSCOPY Right 08/14/2013  ? Procedure: CYSTOSCOPY WITH URETEROSCOPY BLADDER BIOPSY ;  Surgeon: Claybon Jabs, MD;  Location[facility];  Service: Urology;  Laterality: Right;  ? ESOPHAGOGASTRODUODENOSCOPY N/A 02/06/2013  ? Procedure: ESOPHAGOGASTRODUODENOSCOPY (EGD);  Surgeon: Lafayette Dragon, MD;  Location: [facility] ENDOSCOPY;  Service: Endoscopy;  Laterality: N/A;  ? LAPAROSCOPY N/A 11/25/2014  ? Procedure: LAPAROSCOPIC PRIMARY REPAIR OF PERFORATED PREPYLORIC ULCER WITH [facility];  Surgeon: Greer Pickerel,  MD;  Location: [place];  Service: General;  Laterality: N/A;  ? TONSILLECTOMY  as child  ? TRANSTHORACIC ECHOCARDIOGRAM  02-17-2011  ? MODERATE LVH/  EF 65%  ? TRANSURETHRAL RESECTION OF BLADDER TUMOR WITH GYRUS (TURBT-GYRUS) N/A 06/12/2013  ? Procedure: TRANSURETHRAL RESECTION OF BLADDER TUMOR WITH GYRUS (TURBT-GYRUS);  Surgeon: Claybon Jabs, MD;  Location: [facility];  Service: Urology;  Laterality: N/A;  ? TYMPANIC MEMBRANE REPAIR  as child  ?  ? ? ? ?Inpatient  Medications: ?Scheduled Meds: ? metoprolol tartrate  25 mg Oral TID  ? ?Continuous Infusions: ? ?PRN Meds: ? ? ?Allergies:    ?Allergies  ?Allergen Reactions  ? Azithromycin Other (See Comments) and Nausea And Vomiting  ?  "Severe stomach cramps; told to list as an allergy by dr. Years ago"  ? Avelox [Moxifloxacin Hcl In Nacl] Other (See Comments)  ?  Hemolysis  In 2012  ? Bactrim [Sulfamethoxazole-Trimethoprim] Diarrhea and Nausea And Vomiting  ? ? ?Social History:   ?Social History  ? ?Socioeconomic History  ? Marital status: Married  ?  Spouse name: Not on file  ? Number of children: Not on file  ? Years of education: Not on file  ? Highest education level: Not on file  ?Occupational History  ? Occupation: Tobacco Farmer  ?Tobacco Use  ? Smoking status: Every Day  ?  Packs/day: 2.00  ?  Years: 30.00  ?  Pack years: 60.00  ?  Types: Cigarettes  ? Smokeless tobacco: Former  ?  Quit date: 06/08/1978  ? Tobacco comments:  ?  Couple packs per day 12/25/2020  ?Vaping Use  ? Vaping Use: Never used  ?Substance and Sexual Activity  ? Alcohol use: Not Currently  ?  Comment: 5 quarts per week  ? Drug use: No  ? Sexual activity: Not on file  ?Other Topics Concern  ? Not on file  ?Social History Narrative  ? Lives in [place] with wife and 2 sons.   ? ?Social Determinants of Health  ? ?Financial Resource Strain: Not on file  ?Food Insecurity: Not on file  ?Transportation Needs: Not on file  ?Physical Activity: Not on file  ?Stress: Not on file  ?Social Connections: Not on file  ?Intimate Partner Violence: Not on file  ?  ?Family History:   ? ?Family History  ?Problem Relation Age of Onset  ? Breast cancer Mother   ? Cancer Mother   ?     Breast  ? Rheumatic fever Father   ? Heart disease Father   ? Heart attack Father   ?     Massive   ? Diabetes Son   ?  ? ?ROS:  ?Please see the history of present illness.  ? ?All other ROS reviewed and negative.    ? ?Physical Exam/Data:  ? ?Vitals:  ? 07/16/21 2045 07/16/21 2100 07/16/21 2115  07/16/21 2130  ?BP: 112/76 (!) 92/58 100/70 104/65  ?Pulse: (!) 136 (!) 120 (!) 136 (!) 127  ?Resp: (!) 28 (!) 29 19 (!) 36  ?Temp:      ?TempSrc:      ?SpO2: 94% 93%  94%  ?Weight:      ?Height:      ? ? ?Intake/Output Summary (Last 24 hours) at 07/16/2021 2201 ?Last data filed at 07/16/2021 2002 ?Gross per 24 hour  ?Intake 600.15 ml  ?Output --  ?Net 600.15 ml  ? ? ?  07/16/2021  ?  7:27 PM 05/20/2021  ?  2:57 PM 04/28/2021  ?  2:07 PM  ?Last 3 Weights  ?Weight (lbs) 180 lb 171 lb 168 lb 9.6 oz  ?Weight (kg) 81.647 kg 77.565 kg 76.476 kg  ?   ?Body mass index is 25.83 kg/m?.  ?General:  Well nourished, well developed, in no acute distress ?HEENT: normal ?Neck: no JVD ?Vascular: No carotid bruits; Distal pulses 2+ bilaterally ?Cardiac:  normal S1, S2; RRR; no murmur  ?Lungs:  clear to auscultation bilaterally, no wheezing, rhonchi or rales  ?Abd: soft, nontender, no hepatomegaly  ?Ext: no edema ?Musculoskeletal:  No deformities, BUE and BLE strength normal and equal ?Skin: warm and dry  ?Neuro:  CNs 2-12 intact, no focal abnormalities noted ?Psych:  Normal affect  ? ?EKG:  The EKG was personally reviewed and demonstrates:  atypical atrial flutter variable block with rapid ventricular response ?Telemetry:  Telemetry was personally reviewed and demonstrates:  AFL RVR mostly in the 120s ? ?Relevant CV Studies: ? ? ?Laboratory Data: ? ?High Sensitivity Troponin:   ?Recent Labs  ?Lab 07/16/21 ?1649 07/16/21 ?1915  ?TROPONINIHS 41* 51*  ?   ?Chemistry ?Recent Labs  ?Lab 07/16/21 ?1649  ?NA 129*  ?K 4.1  ?CL 93*  ?CO2 23  ?GLUCOSE 183*  ?BUN 20  ?CREATININE 1.60*  ?CALCIUM 8.6*  ?GFRNONAA 48*  ?ANIONGAP 13  ?  ?Recent Labs  ?Lab 07/16/21 ?1649  ?PROT 6.1*  ?ALBUMIN 2.7*  ?AST 23  ?ALT 11  ?ALKPHOS 41  ?BILITOT 2.7*  ? ?Lipids No results for input(s): CHOL, TRIG, HDL, LABVLDL, LDLCALC, CHOLHDL in the last 168 hours.  ?Hematology ?Recent Labs  ?Lab 07/16/21 ?1649  ?WBC 8.8  ?RBC 4.45  ?HGB 16.4  ?HCT 47.4  ?MCV 106.5*  ?MCH  36.9*  ?MCHC 34.6  ?RDW 13.7  ?PLT 150  ? ?Thyroid No results for input(s): TSH, FREET4 in the last 168 hours.  ?BNP ?Recent Labs  ?Lab 07/16/21 ?1900  ?BNP 950.1*  ?  ?DDimer No results for input(s): DDI

## 2021-07-16 NOTE — Assessment & Plan Note (Signed)
CXR with right mid and lower lung infiltrates.  Suspect aspiration pneumonia given history of nausea/vomiting. ?-Start on IV Unasyn ?-Follow blood cultures ?

## 2021-07-16 NOTE — Assessment & Plan Note (Addendum)
Faint wheezing on admission with acute exacerbation. ?-Continue home trelegy ?-Xopenex as needed ?

## 2021-07-16 NOTE — ED Triage Notes (Signed)
Pt with increasing weakness and shob for the past week.  Hx of pancreatitis and feels he has this again.  Endorses drinking again.  Pt states he has a cardiac hx but does not report CHF.  Vomiting recently but not today.  ?

## 2021-07-16 NOTE — Assessment & Plan Note (Signed)
BP on the softer side at time of admission.  Losartan and Lasix on hold.  On metoprolol tartrate 25 mg 3 times daily as BP allows. ?

## 2021-07-16 NOTE — Assessment & Plan Note (Signed)
Continue home Norco twice daily, change to as needed with hold parameters. ?

## 2021-07-16 NOTE — Progress Notes (Signed)
ANTICOAGULATION CONSULT NOTE - Initial Consult ? ?Pharmacy Consult for heparin ?Indication: atrial fibrillation ? ?Allergies  ?Allergen Reactions  ? Azithromycin Other (See Comments) and Nausea And Vomiting  ?  "Severe stomach cramps; told to list as an allergy by dr. Years ago"  ? Avelox [Moxifloxacin Hcl In Nacl] Other (See Comments)  ?  Hemolysis  In 2012  ? Bactrim [Sulfamethoxazole-Trimethoprim] Diarrhea and Nausea And Vomiting  ? ? ?Patient Measurements: ?Height: '5\' 10"'$  (177.8 cm) ?Weight: 81.6 kg (180 lb) ?IBW/kg (Calculated) : 73 ? ?Vital Signs: ?Temp: 97.5 ?F (36.4 ?C) (05/10 1929) ?Temp Source: Oral (05/10 1929) ?BP: 123/59 (05/10 2230) ?Pulse Rate: 113 (05/10 2230) ? ?Labs: ?Recent Labs  ?  07/16/21 ?1649 07/16/21 ?1915  ?HGB 16.4  --   ?HCT 47.4  --   ?PLT 150  --   ?CREATININE 1.60*  --   ?TROPONINIHS 41* 51*  ? ? ?Estimated Creatinine Clearance: 48.8 mL/min (A) (by C-G formula based on SCr of 1.6 mg/dL (H)). ? ? ?Medical History: ?Past Medical History:  ?Diagnosis Date  ? Arthritis   ? Bladder cancer ([place])   ? Chronic back pain   ? Chronic combined systolic and diastolic CHF (congestive heart failure) ([place]) 07/16/2021  ? COPD (chronic obstructive pulmonary disease) ([place])   ? DDD (degenerative disc disease)   ? Diabetic retinopathy of both eyes ([place])   ? Essential hypertension   ? GERD (gastroesophageal reflux disease)   ? History of atrial flutter 02/2011  ? Converted to NSR with Cardizem  ? History of chronic bronchitis   ? History of hemolytic anemia 02/2011  ? secondary to Avelox  ? HOH (hard of hearing)   ? HOH (hard of hearing)   ? no eardrum and nerve damage on R, also HOH on L  ? Mitral valve prolapse   ? a. 2D Echo 11/27/14: EF 55-60%; images were inadequate for LV wall motion assessment, + mild late systolic mitral valve prolapse involving the anterior leaflet.  ? PAD (peripheral artery disease) ([place]) 04/2014  ? Dr Trula Slade; bilateral SFA occlusion, R mid, L distal  ? PAF (paroxysmal atrial  fibrillation) ([place])   ? a. Dx 11/2014 during admission for perf ulcer.  ? Perforated ulcer ([place])   ? a. 11/2014 s/p surgery.  ? Productive cough   ? Smokers' cough ([place])   ? Type 1 diabetes mellitus ([place]) 1977  ? ? ?Assessment: ?66yo male c/p increasing weakness and SOB, known h/o persistent Afib on Xarelto though pt's wife thinks pt may have missed some doses, found to be in Afib/flutter with RVR >> to begin heparin. ? ?Goal of Therapy:  ?Heparin level 0.3-0.7 units/ml ?aPTT 66-102 seconds ?Monitor platelets by anticoagulation protocol: Yes ?  ?Plan:  ?Heparin infusion at 1300 units/hr and monitor heparin levels, aPTT (while Xarelto affects anti-Xa), and CBC. ? ?Wynona Neat, PharmD, BCPS  ?07/16/2021,10:51 PM ? ? ?

## 2021-07-17 DIAGNOSIS — J9601 Acute respiratory failure with hypoxia: Secondary | ICD-10-CM | POA: Diagnosis not present

## 2021-07-17 DIAGNOSIS — I4819 Other persistent atrial fibrillation: Secondary | ICD-10-CM | POA: Diagnosis not present

## 2021-07-17 DIAGNOSIS — I484 Atypical atrial flutter: Secondary | ICD-10-CM

## 2021-07-17 DIAGNOSIS — I5042 Chronic combined systolic (congestive) and diastolic (congestive) heart failure: Secondary | ICD-10-CM

## 2021-07-17 DIAGNOSIS — I5032 Chronic diastolic (congestive) heart failure: Secondary | ICD-10-CM

## 2021-07-17 DIAGNOSIS — E1159 Type 2 diabetes mellitus with other circulatory complications: Secondary | ICD-10-CM

## 2021-07-17 DIAGNOSIS — N179 Acute kidney failure, unspecified: Secondary | ICD-10-CM | POA: Diagnosis not present

## 2021-07-17 DIAGNOSIS — Z794 Long term (current) use of insulin: Secondary | ICD-10-CM

## 2021-07-17 DIAGNOSIS — J449 Chronic obstructive pulmonary disease, unspecified: Secondary | ICD-10-CM

## 2021-07-17 DIAGNOSIS — E119 Type 2 diabetes mellitus without complications: Secondary | ICD-10-CM

## 2021-07-17 DIAGNOSIS — I248 Other forms of acute ischemic heart disease: Secondary | ICD-10-CM

## 2021-07-17 DIAGNOSIS — Z789 Other specified health status: Secondary | ICD-10-CM | POA: Diagnosis not present

## 2021-07-17 DIAGNOSIS — I152 Hypertension secondary to endocrine disorders: Secondary | ICD-10-CM

## 2021-07-17 DIAGNOSIS — E871 Hypo-osmolality and hyponatremia: Secondary | ICD-10-CM

## 2021-07-17 LAB — CBC
HCT: 44.1 % (ref 39.0–52.0)
Hemoglobin: 15.3 g/dL (ref 13.0–17.0)
MCH: 35.7 pg — ABNORMAL HIGH (ref 26.0–34.0)
MCHC: 34.7 g/dL (ref 30.0–36.0)
MCV: 103 fL — ABNORMAL HIGH (ref 80.0–100.0)
Platelets: 127 10*3/uL — ABNORMAL LOW (ref 150–400)
RBC: 4.28 MIL/uL (ref 4.22–5.81)
RDW: 13.5 % (ref 11.5–15.5)
WBC: 8.8 10*3/uL (ref 4.0–10.5)
nRBC: 0 % (ref 0.0–0.2)

## 2021-07-17 LAB — COMPREHENSIVE METABOLIC PANEL
ALT: 10 U/L (ref 0–44)
AST: 19 U/L (ref 15–41)
Albumin: 2.3 g/dL — ABNORMAL LOW (ref 3.5–5.0)
Alkaline Phosphatase: 35 U/L — ABNORMAL LOW (ref 38–126)
Anion gap: 9 (ref 5–15)
BUN: 21 mg/dL (ref 8–23)
CO2: 22 mmol/L (ref 22–32)
Calcium: 8.3 mg/dL — ABNORMAL LOW (ref 8.9–10.3)
Chloride: 98 mmol/L (ref 98–111)
Creatinine, Ser: 1.36 mg/dL — ABNORMAL HIGH (ref 0.61–1.24)
GFR, Estimated: 58 mL/min — ABNORMAL LOW (ref >60)
Glucose, Bld: 160 mg/dL — ABNORMAL HIGH (ref 70–99)
Potassium: 4.7 mmol/L (ref 3.5–5.1)
Sodium: 129 mmol/L — ABNORMAL LOW (ref 135–145)
Total Bilirubin: 1.7 mg/dL — ABNORMAL HIGH (ref 0.3–1.2)
Total Protein: 5.4 g/dL — ABNORMAL LOW (ref 6.5–8.1)

## 2021-07-17 LAB — GLUCOSE, CAPILLARY
Glucose-Capillary: 181 mg/dL — ABNORMAL HIGH (ref 70–99)
Glucose-Capillary: 241 mg/dL — ABNORMAL HIGH (ref 70–99)
Glucose-Capillary: 185 mg/dL — ABNORMAL HIGH (ref 70–99)
Glucose-Capillary: 287 mg/dL — ABNORMAL HIGH (ref 70–99)

## 2021-07-17 LAB — HEMOGLOBIN A1C
Hgb A1c MFr Bld: 5.7 % — ABNORMAL HIGH (ref 4.8–5.6)
Mean Plasma Glucose: 116.89 mg/dL

## 2021-07-17 LAB — HIV ANTIBODY (ROUTINE TESTING W REFLEX): HIV Screen 4th Generation wRfx: NONREACTIVE

## 2021-07-17 LAB — APTT: aPTT: 77 seconds — ABNORMAL HIGH (ref 24–36)

## 2021-07-17 LAB — EXPECTORATED SPUTUM ASSESSMENT W GRAM STAIN, RFLX TO RESP C

## 2021-07-17 LAB — STREP PNEUMONIAE URINARY ANTIGEN: Strep Pneumo Urinary Antigen: POSITIVE — AB

## 2021-07-17 LAB — HEPARIN LEVEL (UNFRACTIONATED): Heparin Unfractionated: >1.1 IU/mL — ABNORMAL HIGH (ref 0.30–0.70)

## 2021-07-17 MED ORDER — RIVAROXABAN 20 MG PO TABS
20.0000 mg | ORAL_TABLET | Freq: Every day | ORAL | Status: DC
Start: 2021-07-17 — End: 2021-07-19
  Administered 2021-07-17 – 2021-07-18 (×2): 20 mg via ORAL
  Filled 2021-07-17 (×2): qty 1

## 2021-07-17 MED ORDER — HYDROCODONE-ACETAMINOPHEN 5-325 MG PO TABS
1.0000 | ORAL_TABLET | Freq: Four times a day (QID) | ORAL | Status: DC | PRN
Start: 2021-07-17 — End: 2021-07-19
  Administered 2021-07-17 – 2021-07-19 (×6): 1 via ORAL
  Filled 2021-07-17 (×6): qty 1

## 2021-07-17 MED ORDER — METOPROLOL TARTRATE 25 MG PO TABS
25.0000 mg | ORAL_TABLET | Freq: Four times a day (QID) | ORAL | Status: DC
  Administered 2021-07-17 – 2021-07-19 (×8): 25 mg via ORAL
  Filled 2021-07-17 (×8): qty 1

## 2021-07-17 MED ORDER — SODIUM CHLORIDE 0.9 % IV SOLN
1.0000 g | INTRAVENOUS | Status: DC
  Administered 2021-07-17 – 2021-07-18 (×2): 1 g via INTRAVENOUS
  Filled 2021-07-17 (×3): qty 10

## 2021-07-17 NOTE — Progress Notes (Signed)
Inpatient Diabetes Program Recommendations ? ?AACE/ADA: New Consensus Statement on Inpatient Glycemic Control (2015) ? ?Target Ranges:  Prepandial:   less than 140 mg/dL ?     Peak postprandial:   less than 180 mg/dL (1-2 hours) ?     Critically ill patients:  140 - 180 mg/dL  ? ?Lab Results  ?Component Value Date  ? GLUCAP 241 (H) 07/17/2021  ? HGBA1C 5.7 (H) 07/17/2021  ? ? ?Review of Glycemic Control ? Latest Reference Range & Units 07/16/21 22:33 07/17/21 05:10 07/17/21 11:21  ?Glucose-Capillary 70 - 99 mg/dL 167 (H) 181 (H) 241 (H)  ? ?Diabetes history: DM 2 ?Outpatient Diabetes medications: Jardiance 10 mg Daily, Lantus 40 units Daily, Novolog 2-7 units tid ?Current orders for Inpatient glycemic control:  ?Novolog 0-15 units tid + hs ? ?Inpatient Diabetes Program Recommendations:   ? ?-  Consider Semglee 10 units ? ?Thanks, ? ?Tama Headings RN, MSN, BC-ADM ?Inpatient Diabetes Coordinator ?Team Pager [phone] (8a-5p) ? ?

## 2021-07-17 NOTE — Progress Notes (Signed)
? ?Progress Note ? ?Patient Name: Thomas Sandoval. ?Date of Encounter: 07/17/2021 ? ?[facility] Cardiologist: Donato Heinz, MD  ? ?Subjective  ? ?Wife at bedside, patient hard of hearing. Denies chest pain, can't feel his heart rate. Has some cough but otherwise no major concerns. He and his wife tell me that he has had cardioversions in the past but they rarely hold for him.  ? ?Inpatient Medications  ?  ?Scheduled Meds: ? fluticasone furoate-vilanterol  1 puff Inhalation Daily  ? And  ? umeclidinium bromide  1 puff Inhalation Daily  ? folic acid  1 mg Oral Daily  ? insulin aspart  0-15 Units Subcutaneous TID WC  ? insulin aspart  0-5 Units Subcutaneous QHS  ? metoprolol tartrate  25 mg Oral Q6H  ? multivitamin with minerals  1 tablet Oral Daily  ? rivaroxaban  20 mg Oral Q supper  ? sodium chloride flush  3 mL Intravenous Q12H  ? thiamine  100 mg Oral Daily  ? Or  ? thiamine  100 mg Intravenous Daily  ? ?Continuous Infusions: ? ampicillin-sulbactam (UNASYN) IV 3 g (07/17/21 1218)  ? heparin 1,300 Units/hr (07/17/21 0849)  ? ?PRN Meds: ?acetaminophen **OR** acetaminophen, HYDROcodone-acetaminophen, levalbuterol, LORazepam **OR** LORazepam, ondansetron **OR** ondansetron (ZOFRAN) IV, senna-docusate  ? ?Vital Signs  ?  ?Vitals:  ? 07/17/21 0924 07/17/21 1024 07/17/21 1102 07/17/21 1124  ?BP: (!) 89/64 91/71 (!) 105/50 (!) 105/50  ?Pulse: (!) 104 (!) 111 (!) 105 (!) 104  ?Resp: (!) 32 (!) 24 (!) 22 19  ?Temp: 98 ?F (36.7 ?C) 98 ?F (36.7 ?C) (!) 97.3 ?F (36.3 ?C) (!) 97.3 ?F (36.3 ?C)  ?TempSrc:   Oral   ?SpO2: 92% 95% 92% 93%  ?Weight:      ?Height:      ? ? ?Intake/Output Summary (Last 24 hours) at 07/17/2021 1334 ?Last data filed at 07/17/2021 1107 ?Gross per 24 hour  ?Intake 1918.46 ml  ?Output 100 ml  ?Net 1818.46 ml  ? ? ?  07/17/2021  ?  4:55 AM 07/16/2021  ?  7:27 PM 05/20/2021  ?  2:57 PM  ?Last 3 Weights  ?Weight (lbs) 168 lb 12.8 oz 180 lb 171 lb  ?Weight (kg) 76.567 kg 81.647 kg 77.565 kg  ?    ? ?Telemetry  ?  ?Atrial flutter, rates 90s-110s - Personally Reviewed ? ?ECG  ?  ?Atrial flutter at 146 bpm - Personally Reviewed ? ?Physical Exam  ? ?GEN: No acute distress.   ?Neck: No JVD ?Cardiac: irregularly irregular, no murmurs, rubs, or gallops.  ?Respiratory: Clear to auscultation L side, diminished/rhoncorous at R base ?GI: Soft, nontender, non-distended  ?MS: No edema; No deformity. ?Neuro:  Nonfocal  ?Psych: Normal affect  ? ?Labs  ?  ?High Sensitivity Troponin:   ?Recent Labs  ?Lab 07/16/21 ?1649 07/16/21 ?1915  ?TROPONINIHS 41* 51*  ?   ?Chemistry ?Recent Labs  ?Lab 07/16/21 ?1649 07/17/21 ?0311  ?NA 129* 129*  ?K 4.1 4.7  ?CL 93* 98  ?CO2 23 22  ?GLUCOSE 183* 160*  ?BUN 20 21  ?CREATININE 1.60* 1.36*  ?CALCIUM 8.6* 8.3*  ?PROT 6.1* 5.4*  ?ALBUMIN 2.7* 2.3*  ?AST 23 19  ?ALT 11 10  ?ALKPHOS 41 35*  ?BILITOT 2.7* 1.7*  ?GFRNONAA 48* 58*  ?ANIONGAP 13 9  ?  ?Lipids No results for input(s): CHOL, TRIG, HDL, LABVLDL, LDLCALC, CHOLHDL in the last 168 hours.  ?Hematology ?Recent Labs  ?Lab 07/16/21 ?1649 07/17/21 ?0311  ?  WBC 8.8 8.8  ?RBC 4.45 4.28  ?HGB 16.4 15.3  ?HCT 47.4 44.1  ?MCV 106.5* 103.0*  ?MCH 36.9* 35.7*  ?MCHC 34.6 34.7  ?RDW 13.7 13.5  ?PLT 150 127*  ? ?Thyroid No results for input(s): TSH, FREET4 in the last 168 hours.  ?BNP ?Recent Labs  ?Lab 07/16/21 ?1900  ?BNP 950.1*  ?  ?DDimer No results for input(s): DDIMER in the last 168 hours.  ? ?Radiology  ?  ?DG Chest 2 View ? ?Result Date: 07/16/2021 ?CLINICAL DATA:  Weakness EXAM: CHEST - 2 VIEW COMPARISON:  12/17/2020 FINDINGS: Extensive airspace disease within the right thorax with patchy airspace disease at the left lung base. No pleural effusion. Underlying mild chronic reticular changes. Normal cardiac size. No pneumothorax IMPRESSION: Extensive airspace disease in the right mid to lower lung with patchy airspace disease at left base most likely representing pneumonia. Imaging follow-up to resolution is recommended. Electronically Signed    By: Donavan Foil M.D.   On: 07/16/2021 17:22   ? ?Cardiac Studies  ? ?Echo 05/21/21 ?1. Left ventricular ejection fraction, by estimation, is 55 to 60%. The  ?left ventricle has normal function. The left ventricle has no regional  ?wall motion abnormalities. Left ventricular diastolic parameters are  ?consistent with Grade II diastolic  ?dysfunction (pseudonormalization). Elevated left ventricular end-diastolic  ?pressure.  ? 2. Right ventricular systolic function is normal. The right ventricular  ?size is normal. There is normal pulmonary artery systolic pressure.  ? 3. The mitral valve is normal in structure. No evidence of mitral valve  ?regurgitation. No evidence of mitral stenosis.  ? 4. The aortic valve is tricuspid. Aortic valve regurgitation is not  ?visualized. No aortic stenosis is present.  ? 5. The inferior vena cava is normal in size with greater than 50%  ?respiratory variability, suggesting right atrial pressure of 3 mmHg.  ? ?Patient Profile  ?   ?66 y.o. male with a hx of paroxysmal atrial fibrillation on xarelto and metoprolol, HFimpEF 30 -> 60% in setting of afib RVR, COPD, EtOH use, DM, HTN who is being seen 07/16/2021 for the evaluation of atrial flutter with rapid venticular response at the request of Dr Davonna Belling. ? ?Assessment & Plan  ?  ?Atrial flutter with RVR ?Elevated troponin ?Community acquired pneumonia ?Acute hypoxic respiratory failure ?-RVR likely driven by acute illness ?-concern about anticoagulation adherence/missed doses at home. He also reports that cardioversions have not held in the past ?-rate control, on metoprolol tartrate 25 mg TID. Home dose metoprolol succinate 75 mg daily. HR now largely 90s. Will increase metoprolol to 25 mg q6 hours ?-Discussed w/Dr. Bonner Puna, no procedures planned that require holding anticoagulation, restart DOAC this evening ?-hsTn 41 > 51, likely demand, no plans for cath ?-management of PNA per primary team ? ?Heart failure, improved  EF ?-BNP elevated at 950 ?-losartan, jardiance on hold for hypotension. Restart when able ? ?For questions or updates, please contact [place] ?Please consult [email or web address] for contact info under  ? ?Signed, ?Buford Dresser, MD  ?07/17/2021, 1:34 PM    ?

## 2021-07-17 NOTE — Progress Notes (Signed)
ANTICOAGULATION CONSULT NOTE  ? ?Pharmacy Consult for heparin ?Indication: atrial fibrillation ? ?Allergies  ?Allergen Reactions  ? Azithromycin Other (See Comments) and Nausea And Vomiting  ?  "Severe stomach cramps; told to list as an allergy by dr. Years ago"  ? Avelox [Moxifloxacin Hcl In Nacl] Other (See Comments)  ?  Hemolysis  In 2012  ? Bactrim [Sulfamethoxazole-Trimethoprim] Diarrhea and Nausea And Vomiting  ? ? ?Patient Measurements: ?Height: '5\' 10"'$  (177.8 cm) ?Weight: 76.6 kg (168 lb 12.8 oz) ?IBW/kg (Calculated) : 73 ? ?Vital Signs: ?Temp: 98 ?F (36.7 ?C) (05/11 9628) ?Temp Source: Oral (05/11 3662) ?BP: 115/98 (05/11 0824) ?Pulse Rate: 117 (05/11 0824) ? ?Labs: ?Recent Labs  ?  07/16/21 ?1649 07/16/21 ?1915 07/17/21 ?9476 07/17/21 ?5465  ?HGB 16.4  --  15.3  --   ?HCT 47.4  --  44.1  --   ?PLT 150  --  127*  --   ?APTT  --   --   --  77*  ?HEPARINUNFRC  --   --   --  >1.10*  ?CREATININE 1.60*  --  1.36*  --   ?TROPONINIHS 41* 51*  --   --   ? ? ? ?Estimated Creatinine Clearance: 57.4 mL/min (A) (by C-G formula based on SCr of 1.36 mg/dL (H)). ? ? ?Medical History: ?Past Medical History:  ?Diagnosis Date  ? Arthritis   ? Bladder cancer ([place])   ? Chronic back pain   ? Chronic combined systolic and diastolic CHF (congestive heart failure) ([place]) 07/16/2021  ? COPD (chronic obstructive pulmonary disease) ([place])   ? DDD (degenerative disc disease)   ? Diabetic retinopathy of both eyes ([place])   ? Essential hypertension   ? GERD (gastroesophageal reflux disease)   ? History of atrial flutter 02/2011  ? Converted to NSR with Cardizem  ? History of chronic bronchitis   ? History of hemolytic anemia 02/2011  ? secondary to Avelox  ? HOH (hard of hearing)   ? HOH (hard of hearing)   ? no eardrum and nerve damage on R, also HOH on L  ? Mitral valve prolapse   ? a. 2D Echo 11/27/14: EF 55-60%; images were inadequate for LV wall motion assessment, + mild late systolic mitral valve prolapse involving the anterior leaflet.   ? PAD (peripheral artery disease) ([place]) 04/2014  ? Dr Trula Slade; bilateral SFA occlusion, R mid, L distal  ? PAF (paroxysmal atrial fibrillation) ([place])   ? a. Dx 11/2014 during admission for perf ulcer.  ? Perforated ulcer ([place])   ? a. 11/2014 s/p surgery.  ? Productive cough   ? Smokers' cough ([place])   ? Type 1 diabetes mellitus ([place]) 1977  ? ? ?Assessment: ?66yo male c/p increasing weakness and SOB, known h/o persistent Afib on Xarelto though pt's wife thinks pt may have missed some doses, found to be in Afib/flutter with RVR >> to begin heparin. ? ?Initial anti-xa level >1.1 due to recent xarelto use. Aptt within goal at 77s, will check confirmatory level this afternoon. No bleeding issues noted, cbc within normal limits.  ? ?Goal of Therapy:  ?Heparin level 0.3-0.7 units/ml ?aPTT 66-102 seconds ?Monitor platelets by anticoagulation protocol: Yes ?  ?Plan:  ?Heparin infusion at 1300 units/hr  ?Daily CBC, anti-xa level, and aptt ? ?Erin Hearing PharmD., BCPS ?Clinical Pharmacist ?07/17/2021 9:46 AM ? ?

## 2021-07-17 NOTE — Progress Notes (Signed)
Admitted 4E06. Vital signs taken, placed on telemetry. CCMD notified. Patient oriented to room.  ?

## 2021-07-17 NOTE — Progress Notes (Signed)
?Progress Note ? ?Patient: Thomas Sandoval. ION:[id number] DOB: Sep 09, 1955  ?DOA: 07/16/2021  DOS: 07/17/2021  ?  ?Brief hospital course: ?Thomas Russ. is a 66 y.o. male with medical history significant for persistent atrial fibrillation on Xarelto, chronic combined systolic and diastolic CHF (EF improved to 55-60% with G2 DD by TTE 05/21/2021), COPD, insulin-dependent diabetes, HTN, PAD, hard of hearing, chronic back pain, alcohol and tobacco use who is admitted with persistent A-fib/flutter with RVR and acute hypoxic respiratory failure due to aspiration pneumonia. ? ?Assessment and Plan: ?Persistent atrial fibrillation/flutter with rapid ventricular response: Rates improving, tolerating elevation for now with soft BP and anticipation of improvement now that infection is being treated.   ?-Started on metoprolol tartrate 25 mg PO TID > Augment to 25 q6h per cardiology.  ?-Started on IV heparin > no procedure planned, so switched back to DOAC. No plan for cardioversion at this time.  ? ?Aspiration pneumonia of right lung [facility]):  ?CXR with right mid and lower lung infiltrates.  Suspect aspiration pneumonia given history of nausea/vomiting. ?- Continue unasyn pending further culture data. Addendum: S. pneumo urine antigen positive, will switch back to ceftriaxone.  ?-Follow blood cultures ? ?Acute respiratory failure with hypoxia: New hypoxia with SPO2 88% on room air on arrival secondary to aspiration pneumonia.  Not requiring supplemental O2 at home at baseline. ?-Continue antibiotics as above ?-Supplemental oxygen as needed ? ?Chronic combined systolic and diastolic CHF with improved EF: ?EF improved to 55-60% on TTE 05/21/2021 from prior 35-35%.  Overall appears volume depleted on admission despite elevated BNP. ?- Hold ARB, jardiance for hypotension for now. As BP rises with improved rate control and Tx infection, will be able to restart these.  ?- Continue metoprolol ? ?AKI: Improved with 1.5 L fluid in  the ED, hold further fluids at this point. Holding Jardiance, Lasix, losartan. ? ?Insulin dependent type 2 diabetes mellitus ([place]) ?Holding [place].  Start on moderate SSI with HS coverage. ? ?Alcohol use: Reports ongoing alcohol use.  History of DTs.   ?- Continue CIWA protocol with as needed Ativan. ? ?COPD with chronic bronchitis and emphysema ([place]) ?Faint wheezing on admission with acute exacerbation. ?- Continue home trelegy ?- Xopenex as needed ? ?Hypertension:  ?- Hold meds as above ? ?Hyponatremia: Monitor ? ?Demand myocardial ischemia: No further inpatient work up currently planned.  ? ?Hyperbilirubinemia and hypoalbuminemia: ?If constellation consistent with cirrhosis.  ?- Monitor in AM.  ? ?Chronic back pain ?Continue home Norco, change to as needed with hold parameters. ? ?Subjective: Short of breath, no chest pain. Feels in a general way quite poorly.  ? ?Objective: ?Vitals:  ? 07/17/21 1102 07/17/21 1124 07/17/21 1400 07/17/21 1624  ?BP: (!) 105/50 (!) 105/50  110/64  ?Pulse: (!) 105 (!) 104  100  ?Resp: (!) '22 19  18  '$ ?Temp: (!) 97.3 ?F (36.3 ?C) (!) 97.3 ?F (36.3 ?C)  (!) 97.3 ?F (36.3 ?C)  ?TempSrc: Oral   Oral  ?SpO2: 92% 93% (P) 92% 93%  ?Weight:      ?Height:      ? ?Gen: Chronically ill-appearing 66 y.o. male in no distress ?Pulm: Tachypneic with crackles and egophony at R base. ?CV: Rapid irregular without murmur, rub, or gallop. No JVD, no dependent edema. ?GI: Abdomen soft, non-tender, non-distended, with normoactive bowel sounds.  ?Ext: Warm, no deformities ?Skin: No rashes, lesions or ulcers on visualized skin. ?Neuro: Alert and oriented. No focal neurological deficits. ?Psych: Judgement and insight appear fair.  Mood euthymic & affect congruent. Behavior is appropriate.   ? ?Data Personally reviewed: ? ?CBC: ?Recent Labs  ?Lab 07/16/21 ?1649 07/17/21 ?0311  ?WBC 8.8 8.8  ?NEUTROABS 8.1*  --   ?HGB 16.4 15.3  ?HCT 47.4 44.1  ?MCV 106.5* 103.0*  ?PLT 150 127*  ? ?Basic Metabolic  Panel: ?Recent Labs  ?Lab 07/16/21 ?1649 07/17/21 ?0311  ?NA 129* 129*  ?K 4.1 4.7  ?CL 93* 98  ?CO2 23 22  ?GLUCOSE 183* 160*  ?BUN 20 21  ?CREATININE 1.60* 1.36*  ?CALCIUM 8.6* 8.3*  ? ?GFR: ?Estimated Creatinine Clearance: 57.4 mL/min (A) (by C-G formula based on SCr of 1.36 mg/dL (H)). ?Liver Function Tests: ?Recent Labs  ?Lab 07/16/21 ?1649 07/17/21 ?0311  ?AST 23 19  ?ALT 11 10  ?ALKPHOS 41 35*  ?BILITOT 2.7* 1.7*  ?PROT 6.1* 5.4*  ?ALBUMIN 2.7* 2.3*  ? ?Recent Labs  ?Lab 07/16/21 ?1915  ?LIPASE 16  ? ?No results for input(s): AMMONIA in the last 168 hours. ?Coagulation Profile: ?No results for input(s): INR, PROTIME in the last 168 hours. ?Cardiac Enzymes: ?No results for input(s): CKTOTAL, CKMB, CKMBINDEX, TROPONINI in the last 168 hours. ?BNP (last 3 results) ?No results for input(s): PROBNP in the last 8760 hours. ?HbA1C: ?Recent Labs  ?  07/17/21 ?0311  ?HGBA1C 5.7*  ? ?CBG: ?Recent Labs  ?Lab 07/16/21 ?2233 07/17/21 ?0510 07/17/21 ?1121  ?GLUCAP 167* 181* 241*  ? ?Lipid Profile: ?No results for input(s): CHOL, HDL, LDLCALC, TRIG, CHOLHDL, LDLDIRECT in the last 72 hours. ?Thyroid Function Tests: ?No results for input(s): TSH, T4TOTAL, FREET4, T3FREE, THYROIDAB in the last 72 hours. ?Anemia Panel: ?No results for input(s): VITAMINB12, FOLATE, FERRITIN, TIBC, IRON, RETICCTPCT in the last 72 hours. ?Urine analysis: ?   ?Component Value Date/Time  ? [place] (A) 12/02/2020 1445  ? APPEARANCEUR HAZY (A) 12/02/2020 1445  ? LABSPEC 1.025 12/02/2020 1445  ? PHURINE 5.0 12/02/2020 1445  ? GLUCOSEU >=500 (A) 12/02/2020 1445  ? HGBUR SMALL (A) 12/02/2020 1445  ? BILIRUBINUR NEGATIVE 12/02/2020 1445  ? BILIRUBINUR neg 04/21/2013 1634  ? KETONESUR 80 (A) 12/02/2020 1445  ? PROTEINUR 100 (A) 12/02/2020 1445  ? UROBILINOGEN 1.0 11/25/2014 0348  ? NITRITE NEGATIVE 12/02/2020 1445  ? LEUKOCYTESUR NEGATIVE 12/02/2020 1445  ? ?Recent Results (from the past 240 hour(s))  ?Culture, blood (routine x 2)     Status: None  (Preliminary result)  ? Collection Time: 07/16/21  6:29 PM  ? Specimen: BLOOD LEFT HAND  ?Result Value Ref Range Status  ? Specimen Description BLOOD LEFT HAND  Final  ? Special Requests   Final  ?  BOTTLES DRAWN AEROBIC AND ANAEROBIC Blood Culture results may not be optimal due to an inadequate volume of blood received in culture bottles  ? Culture   Final  ?  NO GROWTH < 12 HOURS ?Performed at [facility], [place] [address], [place], [place] [address] ?  ? Report Status PENDING  Incomplete  ?Culture, blood (routine x 2)     Status: None (Preliminary result)  ? Collection Time: 07/16/21  7:15 PM  ? Specimen: BLOOD RIGHT HAND  ?Result Value Ref Range Status  ? Specimen Description BLOOD RIGHT HAND  Final  ? Special Requests   Final  ?  BOTTLES DRAWN AEROBIC AND ANAEROBIC Blood Culture results may not be optimal due to an inadequate volume of blood received in culture bottles  ? Culture   Final  ?  NO GROWTH < 12 HOURS ?  Performed at [facility], [place] [address] [place], [place] [address] ?  ? Report Status PENDING  Incomplete  ?Resp Panel by RT-PCR (Flu A&B, Covid) Nasopharyngeal Swab     Status: None  ? Collection Time: 07/16/21  8:39 PM  ? Specimen: Nasopharyngeal Swab; Nasopharyngeal(NP) swabs in vial transport medium  ?Result Value Ref Range Status  ? SARS Coronavirus 2 by RT PCR NEGATIVE NEGATIVE Final  ?  Comment: (NOTE) ?SARS-CoV-2 target nucleic acids are NOT DETECTED. ? ?The SARS-CoV-2 RNA is generally detectable in upper respiratory ?specimens during the acute phase of infection. The lowest ?concentration of SARS-CoV-2 viral copies this assay can detect is ?138 copies/mL. A negative result does not preclude SARS-Cov-2 ?infection and should not be used as the sole basis for treatment or ?other patient management decisions. A negative result may occur with  ?improper specimen collection/handling, submission of specimen other ?than nasopharyngeal swab, presence of viral mutation(s) within the ?areas  targeted by this assay, and inadequate number of viral ?copies(<138 copies/mL). A negative result must be combined with ?clinical observations, patient history, and epidemiological ?information. The expected

## 2021-07-17 NOTE — Progress Notes (Signed)
Mobility Specialist Progress Note ? ? 07/17/21 1400  ?Oxygen Therapy  ?SpO2 92 %  ?O2 Device Nasal Cannula  ?O2 Flow Rate (L/min) 4 L/min  ?Mobility  ?Activity Ambulated with assistance in hallway  ?Level of Assistance Standby assist, set-up cues, supervision of patient - no hands on  ?Assistive Device Front wheel walker  ?Distance Ambulated (ft) 240 ft  ?Activity Response Tolerated well  ?$Mobility charge 1 Mobility  ? ?Pre Mobility: 104 HR, 120/69 BP, 92% SpO2 4LO2 ?During Mobility: 116 HR, 86% - 92% SpO2 4LO2 ?Post Mobility: 109 HR, 110/64 BP, 94% SpO2 4LO2 ? ?Received in bed on 4LO2 c/o slight BP but agreeable to mobility. Session limited by hearing deficiency; also, pt requesting no physical assistance throughout but MS close by for intervention. SpO2 ranging from 86% - 92% on 4LO2, x1 standing rest break to practice pursed lip breathing when at 86% SpO2 and pt resaturated >90% SpO2. Returned back to bed w/o fault but pt c/o R side rib pain w/ exertions. Placed call bell in reach and notified RN.   ? ?Thomas Sandoval ?Mobility Specialist ?Phone Number [phone] ? ?

## 2021-07-18 ENCOUNTER — Inpatient Hospital Stay (HOSPITAL_COMMUNITY): Payer: BC Managed Care – PPO

## 2021-07-18 DIAGNOSIS — Z789 Other specified health status: Secondary | ICD-10-CM | POA: Diagnosis not present

## 2021-07-18 DIAGNOSIS — N179 Acute kidney failure, unspecified: Secondary | ICD-10-CM | POA: Diagnosis not present

## 2021-07-18 DIAGNOSIS — I4819 Other persistent atrial fibrillation: Secondary | ICD-10-CM | POA: Diagnosis not present

## 2021-07-18 DIAGNOSIS — J9601 Acute respiratory failure with hypoxia: Secondary | ICD-10-CM | POA: Diagnosis not present

## 2021-07-18 LAB — COMPREHENSIVE METABOLIC PANEL
ALT: 8 U/L (ref 0–44)
AST: 15 U/L (ref 15–41)
Albumin: 2.2 g/dL — ABNORMAL LOW (ref 3.5–5.0)
Alkaline Phosphatase: 42 U/L (ref 38–126)
Anion gap: 10 (ref 5–15)
BUN: 22 mg/dL (ref 8–23)
CO2: 24 mmol/L (ref 22–32)
Calcium: 8.1 mg/dL — ABNORMAL LOW (ref 8.9–10.3)
Chloride: 90 mmol/L — ABNORMAL LOW (ref 98–111)
Creatinine, Ser: 1.09 mg/dL (ref 0.61–1.24)
GFR, Estimated: >60 mL/min (ref >60)
Glucose, Bld: 253 mg/dL — ABNORMAL HIGH (ref 70–99)
Potassium: 3.6 mmol/L (ref 3.5–5.1)
Sodium: 124 mmol/L — ABNORMAL LOW (ref 135–145)
Total Bilirubin: 1.3 mg/dL — ABNORMAL HIGH (ref 0.3–1.2)
Total Protein: 5.3 g/dL — ABNORMAL LOW (ref 6.5–8.1)

## 2021-07-18 LAB — LEGIONELLA PNEUMOPHILA SEROGP 1 UR AG: L. pneumophila Serogp 1 Ur Ag: NEGATIVE

## 2021-07-18 LAB — CBC
HCT: 37.6 % — ABNORMAL LOW (ref 39.0–52.0)
Hemoglobin: 13.5 g/dL (ref 13.0–17.0)
MCH: 36.9 pg — ABNORMAL HIGH (ref 26.0–34.0)
MCHC: 35.9 g/dL (ref 30.0–36.0)
MCV: 102.7 fL — ABNORMAL HIGH (ref 80.0–100.0)
Platelets: 132 10*3/uL — ABNORMAL LOW (ref 150–400)
RBC: 3.66 MIL/uL — ABNORMAL LOW (ref 4.22–5.81)
RDW: 13 % (ref 11.5–15.5)
WBC: 10.4 10*3/uL (ref 4.0–10.5)
nRBC: 0 % (ref 0.0–0.2)

## 2021-07-18 LAB — GLUCOSE, CAPILLARY
Glucose-Capillary: 249 mg/dL — ABNORMAL HIGH (ref 70–99)
Glucose-Capillary: 308 mg/dL — ABNORMAL HIGH (ref 70–99)
Glucose-Capillary: 248 mg/dL — ABNORMAL HIGH (ref 70–99)
Glucose-Capillary: 221 mg/dL — ABNORMAL HIGH (ref 70–99)

## 2021-07-18 MED ORDER — INSULIN GLARGINE-YFGN 100 UNIT/ML ~~LOC~~ SOLN
20.0000 [IU] | Freq: Every day | SUBCUTANEOUS | Status: DC
  Administered 2021-07-18: 20 [IU] via SUBCUTANEOUS
  Filled 2021-07-18 (×2): qty 0.2

## 2021-07-18 NOTE — Plan of Care (Signed)
?  Problem: Clinical Measurements: ?Goal: Ability to maintain a body temperature in the normal range will improve ?Outcome: Progressing ?  ?Problem: Respiratory: ?Goal: Ability to maintain adequate ventilation will improve ?Outcome: Progressing ?Goal: Ability to maintain a clear airway will improve ?Outcome: Progressing ?  ?Problem: Activity: ?Goal: Ability to tolerate increased activity will improve ?Outcome: Progressing ?  ?Problem: Respiratory: ?Goal: Ability to maintain adequate ventilation will improve ?Outcome: Progressing ?Goal: Ability to maintain a clear airway will improve ?Outcome: Progressing ?  ?Problem: Education: ?Goal: Knowledge of disease or condition will improve ?Outcome: Progressing ?Goal: Understanding of medication regimen will improve ?Outcome: Progressing ?Goal: Individualized Educational Video(s) ?Outcome: Progressing ?  ?Problem: Cardiac: ?Goal: Ability to achieve and maintain adequate cardiopulmonary perfusion will improve ?Outcome: Progressing ?  ?Problem: Health Behavior/Discharge Planning: ?Goal: Ability to safely manage health-related needs after discharge will improve ?Outcome: Progressing ?  ?Problem: Elimination: ?Goal: Will not experience complications related to bowel motility ?Outcome: Progressing ?Goal: Will not experience complications related to urinary retention ?Outcome: Progressing ?  ?Problem: Pain Managment: ?Goal: General experience of comfort will improve ?Outcome: Progressing ?  ?

## 2021-07-18 NOTE — Progress Notes (Signed)
Mobility Specialist Progress Note ? ? 07/18/21 1105  ?Mobility  ?Activity Ambulated with assistance in hallway  ?Level of Assistance Standby assist, set-up cues, supervision of patient - no hands on  ?Assistive Device Front wheel walker  ?Distance Ambulated (ft) 170 ft  ?Activity Response Tolerated well  ?$Mobility charge 1 Mobility  ? ?Pre Mobility: 116 HR, 96/70 BP, 92% SpO2 5LO2 ?During Mobility: 124 HR, 110/62 BP, 87% - 93% SpO2 on 6LO2 ?Post Mobility: 115 HR, 90/65 BP, 96% SpO2 on 5LO2 ? ?Received sitting EOB on 5LO2 w/ no complaints and agreeable. Presenting w/ low BP's today but no reported orthostatic symptoms throughout. Pt placed on 6LO2 while ambulating d/t O2 tank not having a 5L setting, SpO2 ranging from 87% - 93%. Pt requesting to take a seated break in room d/t back pain but no reported SOB or dyspnea. Remained in room d/t elevated HR, pt requesting to ambulate again later today. Will f/u if time permits. RN notified about session.     ? ?Holland Falling ?Mobility Specialist ?Phone Number [phone] ? ?

## 2021-07-18 NOTE — Progress Notes (Signed)
?Progress Note ? ?Patient: Thomas Sandoval. AXK:[id number] DOB: 07-10-1955  ?DOA: 07/16/2021  DOS: 07/18/2021  ?  ?Brief hospital course: ?Thomas Sapia. is a 66 y.o. male with medical history significant for persistent atrial fibrillation on Xarelto, chronic combined systolic and diastolic CHF (EF improved to 55-60% with G2 DD by TTE 05/21/2021), COPD, insulin-dependent diabetes, HTN, PAD, hard of hearing, chronic back pain, alcohol and tobacco use who is admitted with persistent A-fib/flutter with RVR and acute hypoxic respiratory failure due to right lower lobe pneumococcal pneumonia.  ? ?Assessment and Plan: ?Persistent atrial fibrillation/flutter with rapid ventricular response: Rates improving, tolerating elevation for now with soft BP and anticipation of improvement now that infection is being treated.   ?- Continue metoprolol tartrate 25 q6h per cardiology. If rates become too high, consider digoxin. Amiodarone also an option, though he has suspected liver disease and there's report of possible incomplete adherence and would ideally not want to chemically cardiovert under those circumstances. Again, suspect with infection Tx, rate control should become easier. ?- Started on IV heparin > no procedure planned, so switched back to DOAC. No plan for cardioversion at this time.  ? ?Pneumococcal pneumonia of right lung [facility]):  ?CXR with right mid and lower lung infiltrates.  Suspect aspiration pneumonia given history of nausea/vomiting. ?- Continue ceftriaxone ?- Follow blood, sputum cultures (though organism ID'ed by urine Ag). ? ?Acute respiratory failure with hypoxia: New hypoxia with SPO2 88% on room air on arrival secondary to aspiration pneumonia.  Not requiring supplemental O2 at home at baseline. ?-Continue antibiotics as above ?-Supplemental oxygen as needed. Still into high 80%'s with ambulation on 6L O2. ? ?Chronic combined systolic and diastolic CHF with improved EF: ?EF improved to 55-60% on  TTE 05/21/2021 from prior 35-35%.  Overall appears volume depleted on admission despite elevated BNP, euvolemic currently. ?- Hold ARB, jardiance for hypotension for now. As BP rises with improved rate control and Tx infection, will be able to restart these.  ?- Continue metoprolol as above ? ?AKI: Improved with 1.5 L fluid in the ED, hold further fluids at this point. Holding Jardiance, Lasix, losartan. ? ?IDT2DM: With HbA1c 5.7% which is likely too tight of control. ?- Holding Jardiance ?- Restart basal insulin at reduced dose, suspect diet is not as carb-rich here. Continue SSI at moderate scale.  ? ?Alcohol use: Reports ongoing alcohol use.  History of DTs.   ?- Continue CIWA protocol with as needed Ativan. No evidence of withdrawal currently.  ? ?COPD with chronic bronchitis and emphysema ([place]) ?Faint wheezing on admission with acute exacerbation. ?- Continue home trelegy ?- Xopenex as needed ? ?Hypertension:  ?- Hold meds as above ? ?Hyponatremia: Monitor ? ?Demand myocardial ischemia: No further inpatient work up currently planned.  ? ?Hyperbilirubinemia and hypoalbuminemia: ?If constellation consistent with cirrhosis. Also thrombocytopenia. ?- With heavy EtOH history, will check RUQ U/S ? ?Chronic back pain ?- Continue home Norco, change to as needed with hold parameters. ? ?Subjective: Wants to go home but agrees that he is dependent on oxygen at this point. Wants to go home to see a project his son's doing, check on it, and come back. No chest pain. No significant palpitations. He's eating better and wants his blood sugars better controlled. ? ?Objective: ?Vitals:  ? 07/18/21 0737 07/18/21 1105 07/18/21 1154 07/18/21 1605  ?BP: 98/66 90/65 115/71 114/66  ?Pulse: 98 66 (!) 110 (!) 106  ?Resp: 18 (!) '23 17 18  '$ ?Temp: (!) 97.5 ?F (  36.4 ?C) 98 ?F (36.7 ?C) 98 ?F (36.7 ?C) (!) 97.4 ?F (36.3 ?C)  ?TempSrc: Oral Oral Oral Oral  ?SpO2: 93% 93% 93% 97%  ?Weight:      ?Height:      ? ?Gen: 66 y.o. male in no  distress ?Pulm: Nonlabored tachypnea with 5L O2, crackles at R base. ?CV: Irreg irreg, rate hovering 90-110's without murmur, rub, or gallop. No JVD, no pitting dependent edema. ?GI: Abdomen soft, non-tender, non-distended, with normoactive bowel sounds.  ?Ext: Warm, no deformities ?Skin: No rashes, lesions or ulcers on visualized skin. ?Neuro: Alert and oriented. No focal neurological deficits. ?Psych: Judgement and insight appear fair. Mood euthymic & affect congruent. Behavior is appropriate.   ? ?Data Personally reviewed: ? ?CBC: ?Recent Labs  ?Lab 07/16/21 ?1649 07/17/21 ?0311 07/18/21 ?0140  ?WBC 8.8 8.8 10.4  ?NEUTROABS 8.1*  --   --   ?HGB 16.4 15.3 13.5  ?HCT 47.4 44.1 37.6*  ?MCV 106.5* 103.0* 102.7*  ?PLT 150 127* 132*  ? ?Basic Metabolic Panel: ?Recent Labs  ?Lab 07/16/21 ?1649 07/17/21 ?0311 07/18/21 ?0140  ?NA 129* 129* 124*  ?K 4.1 4.7 3.6  ?CL 93* 98 90*  ?CO2 '23 22 24  '$ ?GLUCOSE 183* 160* 253*  ?BUN '20 21 22  '$ ?CREATININE 1.60* 1.36* 1.09  ?CALCIUM 8.6* 8.3* 8.1*  ? ?GFR: ?Estimated Creatinine Clearance: 71.6 mL/min (by C-G formula based on SCr of 1.09 mg/dL). ?Liver Function Tests: ?Recent Labs  ?Lab 07/16/21 ?1649 07/17/21 ?0311 07/18/21 ?0140  ?AST '23 19 15  '$ ?ALT '11 10 8  '$ ?ALKPHOS 41 35* 42  ?BILITOT 2.7* 1.7* 1.3*  ?PROT 6.1* 5.4* 5.3*  ?ALBUMIN 2.7* 2.3* 2.2*  ? ?Recent Labs  ?Lab 07/16/21 ?1915  ?LIPASE 16  ? ?No results for input(s): AMMONIA in the last 168 hours. ?Coagulation Profile: ?No results for input(s): INR, PROTIME in the last 168 hours. ?Cardiac Enzymes: ?No results for input(s): CKTOTAL, CKMB, CKMBINDEX, TROPONINI in the last 168 hours. ?BNP (last 3 results) ?No results for input(s): PROBNP in the last 8760 hours. ?HbA1C: ?Recent Labs  ?  07/17/21 ?0311  ?HGBA1C 5.7*  ? ?CBG: ?Recent Labs  ?Lab 07/17/21 ?1629 07/17/21 ?2123 07/18/21 ?7858 07/18/21 ?1139 07/18/21 ?1602  ?GLUCAP 185* 287* 249* 308* 248*  ? ?Lipid Profile: ?No results for input(s): CHOL, HDL, LDLCALC, TRIG, CHOLHDL,  LDLDIRECT in the last 72 hours. ?Thyroid Function Tests: ?No results for input(s): TSH, T4TOTAL, FREET4, T3FREE, THYROIDAB in the last 72 hours. ?Anemia Panel: ?No results for input(s): VITAMINB12, FOLATE, FERRITIN, TIBC, IRON, RETICCTPCT in the last 72 hours. ?Urine analysis: ?   ?Component Value Date/Time  ? [place] (A) 12/02/2020 1445  ? APPEARANCEUR HAZY (A) 12/02/2020 1445  ? LABSPEC 1.025 12/02/2020 1445  ? PHURINE 5.0 12/02/2020 1445  ? GLUCOSEU >=500 (A) 12/02/2020 1445  ? HGBUR SMALL (A) 12/02/2020 1445  ? BILIRUBINUR NEGATIVE 12/02/2020 1445  ? BILIRUBINUR neg 04/21/2013 1634  ? KETONESUR 80 (A) 12/02/2020 1445  ? PROTEINUR 100 (A) 12/02/2020 1445  ? UROBILINOGEN 1.0 11/25/2014 0348  ? NITRITE NEGATIVE 12/02/2020 1445  ? LEUKOCYTESUR NEGATIVE 12/02/2020 1445  ? ?Recent Results (from the past 240 hour(s))  ?Culture, blood (routine x 2)     Status: None (Preliminary result)  ? Collection Time: 07/16/21  6:29 PM  ? Specimen: BLOOD LEFT HAND  ?Result Value Ref Range Status  ? Specimen Description BLOOD LEFT HAND  Final  ? Special Requests   Final  ?  BOTTLES DRAWN AEROBIC AND  ANAEROBIC Blood Culture results may not be optimal due to an inadequate volume of blood received in culture bottles  ? Culture   Final  ?  NO GROWTH 2 DAYS ?Performed at [facility], [place] [address] [place], [place] [address] ?  ? Report Status PENDING  Incomplete  ?Culture, blood (routine x 2)     Status: None (Preliminary result)  ? Collection Time: 07/16/21  7:15 PM  ? Specimen: BLOOD RIGHT HAND  ?Result Value Ref Range Status  ? Specimen Description BLOOD RIGHT HAND  Final  ? Special Requests   Final  ?  BOTTLES DRAWN AEROBIC AND ANAEROBIC Blood Culture results may not be optimal due to an inadequate volume of blood received in culture bottles  ? Culture   Final  ?  NO GROWTH 2 DAYS ?Performed at [facility], [place] [address] [place], [place] [address] ?  ? Report Status PENDING  Incomplete  ?Resp Panel by RT-PCR  (Flu A&B, Covid) Nasopharyngeal Swab     Status: None  ? Collection Time: 07/16/21  8:39 PM  ? Specimen: Nasopharyngeal Swab; Nasopharyngeal(NP) swabs in vial transport medium  ?Result Value Ref Range Status  ?

## 2021-07-18 NOTE — Progress Notes (Signed)
Progress Note  Patient Name: Thomas Sandoval. Date of Encounter: 07/18/2021  [facility] Cardiologist: Donato Heinz, MD   Subjective   Was just up and walking with PT. Tired but doing well overall.  Inpatient Medications    Scheduled Meds:  fluticasone furoate-vilanterol  1 puff Inhalation Daily   And   umeclidinium bromide  1 puff Inhalation Daily   folic acid  1 mg Oral Daily   insulin aspart  0-15 Units Subcutaneous TID WC   insulin aspart  0-5 Units Subcutaneous QHS   insulin glargine-yfgn  20 Units Subcutaneous Daily   metoprolol tartrate  25 mg Oral Q6H   multivitamin with minerals  1 tablet Oral Daily   rivaroxaban  20 mg Oral Q supper   sodium chloride flush  3 mL Intravenous Q12H   thiamine  100 mg Oral Daily   Or   thiamine  100 mg Intravenous Daily   Continuous Infusions:  cefTRIAXone (ROCEPHIN)  IV 1 g (07/17/21 1754)   PRN Meds: acetaminophen **OR** acetaminophen, HYDROcodone-acetaminophen, levalbuterol, LORazepam **OR** LORazepam, ondansetron **OR** ondansetron (ZOFRAN) IV, senna-docusate   Vital Signs    Vitals:   07/18/21 0332 07/18/21 0737 07/18/21 1105 07/18/21 1154  BP:  98/66 90/65 115/71  Pulse:  98 66 (!) 110  Resp:  18 (!) 23 17  Temp:  (!) 97.5 F (36.4 C) 98 F (36.7 C) 98 F (36.7 C)  TempSrc:  Oral Oral Oral  SpO2:  93% 93% 93%  Weight: 76.7 kg     Height:        Intake/Output Summary (Last 24 hours) at 07/18/2021 1204 Last data filed at 07/18/2021 0326 Gross per 24 hour  Intake 250 ml  Output 1650 ml  Net -1400 ml      07/18/2021    3:32 AM 07/17/2021    4:55 AM 07/16/2021    7:27 PM  Last 3 Weights  Weight (lbs) 169 lb 1.6 oz 168 lb 12.8 oz 180 lb  Weight (kg) 76.703 kg 76.567 kg 81.647 kg      Telemetry    Atrial flutter, rates 90s-110s - Personally Reviewed  ECG    07/16/21 Atrial flutter at 146 bpm - Personally Reviewed  Physical Exam   GEN: Well nourished, well developed in no acute  distress HEENT: Normal, moist mucous membranes NECK: No JVD CARDIAC: irregularly irregular rhythm, normal S1 and S2, no rubs or gallops. No murmur. VASCULAR: Radial and DP pulses 2+ bilaterally. No carotid bruits RESPIRATORY:  diminished at base with crackles ABDOMEN: Soft, non-tender, non-distended MUSCULOSKELETAL:  Ambulates independently SKIN: Warm and dry, no edema NEUROLOGIC:  Alert and oriented x 3. No focal neuro deficits noted. PSYCHIATRIC:  Normal affect    Labs    High Sensitivity Troponin:   Recent Labs  Lab 07/16/21 1649 07/16/21 1915  TROPONINIHS 41* 51*     Chemistry Recent Labs  Lab 07/16/21 1649 07/17/21 0311 07/18/21 0140  NA 129* 129* 124*  K 4.1 4.7 3.6  CL 93* 98 90*  CO2 '23 22 24  '$ GLUCOSE 183* 160* 253*  BUN '20 21 22  '$ CREATININE 1.60* 1.36* 1.09  CALCIUM 8.6* 8.3* 8.1*  PROT 6.1* 5.4* 5.3*  ALBUMIN 2.7* 2.3* 2.2*  AST '23 19 15  '$ ALT '11 10 8  '$ ALKPHOS 41 35* 42  BILITOT 2.7* 1.7* 1.3*  GFRNONAA 48* 58* >60  ANIONGAP '13 9 10    '$ Lipids No results for input(s): CHOL, TRIG, HDL, LABVLDL, LDLCALC, CHOLHDL  in the last 168 hours.  Hematology Recent Labs  Lab 07/16/21 1649 07/17/21 0311 07/18/21 0140  WBC 8.8 8.8 10.4  RBC 4.45 4.28 3.66*  HGB 16.4 15.3 13.5  HCT 47.4 44.1 37.6*  MCV 106.5* 103.0* 102.7*  MCH 36.9* 35.7* 36.9*  MCHC 34.6 34.7 35.9  RDW 13.7 13.5 13.0  PLT 150 127* 132*   Thyroid No results for input(s): TSH, FREET4 in the last 168 hours.  BNP Recent Labs  Lab 07/16/21 1900  BNP 950.1*    DDimer No results for input(s): DDIMER in the last 168 hours.   Radiology    DG Chest 2 View  Result Date: 07/16/2021 CLINICAL DATA:  Weakness EXAM: CHEST - 2 VIEW COMPARISON:  12/17/2020 FINDINGS: Extensive airspace disease within the right thorax with patchy airspace disease at the left lung base. No pleural effusion. Underlying mild chronic reticular changes. Normal cardiac size. No pneumothorax IMPRESSION: Extensive airspace  disease in the right mid to lower lung with patchy airspace disease at left base most likely representing pneumonia. Imaging follow-up to resolution is recommended. Electronically Signed   By: Donavan Foil M.D.   On: 07/16/2021 17:22    Cardiac Studies   Echo 05/21/21 1. Left ventricular ejection fraction, by estimation, is 55 to 60%. The  left ventricle has normal function. The left ventricle has no regional  wall motion abnormalities. Left ventricular diastolic parameters are  consistent with Grade II diastolic  dysfunction (pseudonormalization). Elevated left ventricular end-diastolic  pressure.   2. Right ventricular systolic function is normal. The right ventricular  size is normal. There is normal pulmonary artery systolic pressure.   3. The mitral valve is normal in structure. No evidence of mitral valve  regurgitation. No evidence of mitral stenosis.   4. The aortic valve is tricuspid. Aortic valve regurgitation is not  visualized. No aortic stenosis is present.   5. The inferior vena cava is normal in size with greater than 50%  respiratory variability, suggesting right atrial pressure of 3 mmHg.   Patient Profile     66 y.o. male with a hx of paroxysmal atrial fibrillation on xarelto and metoprolol, HFimpEF 30 -> 60% in setting of afib RVR, COPD, EtOH use, DM, HTN who is being seen 07/16/2021 for the evaluation of atrial flutter with rapid venticular response at the request of Dr Davonna Belling.  Assessment & Plan    Atrial flutter with RVR Elevated troponin Community acquired pneumonia Acute hypoxic respiratory failure -RVR likely driven by acute illness -concern about anticoagulation adherence/missed doses at home. He also reports that cardioversions have not held in the past -Home dose metoprolol succinate 75 mg daily. Increased metoprolol to 25 mg q6 hours 5/11. BP limits further uptitration. -if heart rate increased and BP low, options would be digoxin vs amiodarone.  There is a small risk of chemical cardioversion with amiodarone, but if no other agents are a good option it might be needed -restarted rivaroxaban -hsTn 41 > 51, likely demand, no plans for cath -management of PNA per primary team  Heart failure, improved EF (55-60% 05/21/21) -BNP elevated at 950, though clinically appears euvolemic -losartan, jardiance on hold for hypotension. Restart when able, bp remains low today -aiming for rate control, while recent EF improved want to avoid tachycardia-induced cardiomyopathy.  For questions or updates, please contact [place] Please consult [email or web address] for contact info under   Signed, Buford Dresser, MD  07/18/2021, 12:04 PM

## 2021-07-18 NOTE — Progress Notes (Signed)
Inpatient Diabetes Program Recommendations ? ?AACE/ADA: New Consensus Statement on Inpatient Glycemic Control (2015) ? ?Target Ranges:  Prepandial:   less than 140 mg/dL ?     Peak postprandial:   less than 180 mg/dL (1-2 hours) ?     Critically ill patients:  140 - 180 mg/dL  ? ? Latest Reference Range & Units 07/17/21 05:10 07/17/21 11:21 07/17/21 16:29 07/17/21 21:23  ?Glucose-Capillary 70 - 99 mg/dL 181 (H) 241 (H) 185 (H) 287 (H)  ?(H): Data is abnormally high ? Latest Reference Range & Units 07/18/21 06:27  ?Glucose-Capillary 70 - 99 mg/dL 249 (H)  ?(H): Data is abnormally high ? ? ? ? ?Home DM Meds: Jardiance 10 mg Daily ?     Lantus 40 units Daily ?     Novolog 2-7 units tid ? ?Current Orders: Novolog Moderate Correction Scale/ SSI (0-15 units) TID AC + HS ? ? ? ?MD- Note pt takes Lantus at home ? ?Please consider starting Semglee 12 units Daily (1/3 total home dose) ? ? ? ?--Will follow patient during hospitalization-- ? ?Wyn Quaker RN, MSN, CDE ?Diabetes Coordinator ?Inpatient Glycemic Control Team ?Team Pager: [phone] (8a-5p) ? ? ?

## 2021-07-19 DIAGNOSIS — M545 Low back pain, unspecified: Secondary | ICD-10-CM

## 2021-07-19 DIAGNOSIS — J9601 Acute respiratory failure with hypoxia: Secondary | ICD-10-CM | POA: Diagnosis not present

## 2021-07-19 DIAGNOSIS — Z789 Other specified health status: Secondary | ICD-10-CM | POA: Diagnosis not present

## 2021-07-19 DIAGNOSIS — G8929 Other chronic pain: Secondary | ICD-10-CM

## 2021-07-19 DIAGNOSIS — I4819 Other persistent atrial fibrillation: Secondary | ICD-10-CM | POA: Diagnosis not present

## 2021-07-19 DIAGNOSIS — N179 Acute kidney failure, unspecified: Secondary | ICD-10-CM | POA: Diagnosis not present

## 2021-07-19 LAB — COMPREHENSIVE METABOLIC PANEL
ALT: 11 U/L (ref 0–44)
AST: 20 U/L (ref 15–41)
Albumin: 2.2 g/dL — ABNORMAL LOW (ref 3.5–5.0)
Alkaline Phosphatase: 45 U/L (ref 38–126)
Anion gap: 11 (ref 5–15)
BUN: 22 mg/dL (ref 8–23)
CO2: 23 mmol/L (ref 22–32)
Calcium: 8.3 mg/dL — ABNORMAL LOW (ref 8.9–10.3)
Chloride: 91 mmol/L — ABNORMAL LOW (ref 98–111)
Creatinine, Ser: 1.04 mg/dL (ref 0.61–1.24)
GFR, Estimated: >60 mL/min (ref >60)
Glucose, Bld: 295 mg/dL — ABNORMAL HIGH (ref 70–99)
Potassium: 3.5 mmol/L (ref 3.5–5.1)
Sodium: 125 mmol/L — ABNORMAL LOW (ref 135–145)
Total Bilirubin: 1.1 mg/dL (ref 0.3–1.2)
Total Protein: 5.3 g/dL — ABNORMAL LOW (ref 6.5–8.1)

## 2021-07-19 LAB — IRON AND TIBC
Iron: 24 ug/dL — ABNORMAL LOW (ref 45–182)
Saturation Ratios: 16 % — ABNORMAL LOW (ref 17.9–39.5)
TIBC: 153 ug/dL — ABNORMAL LOW (ref 250–450)
UIBC: 129 ug/dL

## 2021-07-19 LAB — RETICULOCYTES
Immature Retic Fract: 20.9 % — ABNORMAL HIGH (ref 2.3–15.9)
RBC.: 3.58 MIL/uL — ABNORMAL LOW (ref 4.22–5.81)
Retic Count, Absolute: 93.1 10*3/uL (ref 19.0–186.0)
Retic Ct Pct: 2.6 % (ref 0.4–3.1)

## 2021-07-19 LAB — CBC
HCT: 37.5 % — ABNORMAL LOW (ref 39.0–52.0)
Hemoglobin: 13.4 g/dL (ref 13.0–17.0)
MCH: 36.7 pg — ABNORMAL HIGH (ref 26.0–34.0)
MCHC: 35.7 g/dL (ref 30.0–36.0)
MCV: 102.7 fL — ABNORMAL HIGH (ref 80.0–100.0)
Platelets: 138 10*3/uL — ABNORMAL LOW (ref 150–400)
RBC: 3.65 MIL/uL — ABNORMAL LOW (ref 4.22–5.81)
RDW: 13.1 % (ref 11.5–15.5)
WBC: 9.1 10*3/uL (ref 4.0–10.5)
nRBC: 0 % (ref 0.0–0.2)

## 2021-07-19 LAB — VITAMIN B12: Vitamin B-12: 956 pg/mL — ABNORMAL HIGH (ref 180–914)

## 2021-07-19 LAB — GLUCOSE, CAPILLARY
Glucose-Capillary: 383 mg/dL — ABNORMAL HIGH (ref 70–99)
Glucose-Capillary: 218 mg/dL — ABNORMAL HIGH (ref 70–99)
Glucose-Capillary: 406 mg/dL — ABNORMAL HIGH (ref 70–99)

## 2021-07-19 LAB — FOLATE: Folate: 9 ng/mL (ref >5.9)

## 2021-07-19 LAB — FERRITIN: Ferritin: 516 ng/mL — ABNORMAL HIGH (ref 24–336)

## 2021-07-19 MED ORDER — POTASSIUM CHLORIDE CRYS ER 20 MEQ PO TBCR
40.0000 meq | EXTENDED_RELEASE_TABLET | Freq: Once | ORAL | Status: AC
  Administered 2021-07-19: 40 meq via ORAL
  Filled 2021-07-19: qty 2

## 2021-07-19 MED ORDER — INSULIN GLARGINE-YFGN 100 UNIT/ML ~~LOC~~ SOLN
40.0000 [IU] | Freq: Every day | SUBCUTANEOUS | Status: DC
  Administered 2021-07-19: 40 [IU] via SUBCUTANEOUS
  Filled 2021-07-19: qty 0.4

## 2021-07-19 MED ORDER — METOPROLOL SUCCINATE ER 100 MG PO TB24: 100.0000 mg | ORAL_TABLET | Freq: Every day | ORAL | 0 refills | Status: DC

## 2021-07-19 MED ORDER — CEFDINIR 300 MG PO CAPS: 300.0000 mg | ORAL_CAPSULE | Freq: Two times a day (BID) | ORAL | 0 refills | Status: DC

## 2021-07-19 MED ORDER — DILTIAZEM HCL 60 MG PO TABS
30.0000 mg | ORAL_TABLET | Freq: Three times a day (TID) | ORAL | Status: DC
  Administered 2021-07-19: 30 mg via ORAL
  Filled 2021-07-19: qty 1

## 2021-07-19 MED ORDER — LOSARTAN POTASSIUM 50 MG PO TABS: 50.0000 mg | ORAL_TABLET | Freq: Every day | ORAL | Status: DC

## 2021-07-19 MED ORDER — FUROSEMIDE 10 MG/ML IJ SOLN
40.0000 mg | Freq: Two times a day (BID) | INTRAMUSCULAR | Status: DC
  Administered 2021-07-19: 40 mg via INTRAVENOUS
  Filled 2021-07-19: qty 4

## 2021-07-19 NOTE — Progress Notes (Signed)
?Cardiology Progress Note  ?Patient ID: Thomas Sandoval. ?MRN: [id number] ?DOB: 1955/09/05 ?Date of Encounter: 07/19/2021 ? ?Primary Cardiologist: Donato Heinz, MD ? ?Subjective  ? ?Chief Complaint: SOB ? ?HPI: Still short of breath.  Oxygen requirement is improving.  A-fib appears to be better controlled.  Blood pressure improved.  Sodium still trending down. ? ?ROS:  ?All other ROS reviewed and negative. Pertinent positives noted in the HPI.    ? ?Inpatient Medications  ?Scheduled Meds: ? diltiazem  30 mg Oral Q8H  ? fluticasone furoate-vilanterol  1 puff Inhalation Daily  ? And  ? umeclidinium bromide  1 puff Inhalation Daily  ? folic acid  1 mg Oral Daily  ? furosemide  40 mg Intravenous BID  ? insulin aspart  0-15 Units Subcutaneous TID WC  ? insulin aspart  0-5 Units Subcutaneous QHS  ? insulin glargine-yfgn  40 Units Subcutaneous Daily  ? metoprolol tartrate  25 mg Oral Q6H  ? multivitamin with minerals  1 tablet Oral Daily  ? rivaroxaban  20 mg Oral Q supper  ? sodium chloride flush  3 mL Intravenous Q12H  ? thiamine  100 mg Oral Daily  ? Or  ? thiamine  100 mg Intravenous Daily  ? ?Continuous Infusions: ? cefTRIAXone (ROCEPHIN)  IV 1 g (07/18/21 1657)  ? ?PRN Meds: ?acetaminophen **OR** acetaminophen, HYDROcodone-acetaminophen, levalbuterol, LORazepam **OR** LORazepam, ondansetron **OR** ondansetron (ZOFRAN) IV, senna-docusate  ? ?Vital Signs  ? ?Vitals:  ? 07/18/21 2346 07/19/21 0229 07/19/21 0500 07/19/21 0741  ?BP: 102/60 (!) 104/51  132/73  ?Pulse: (!) 103 82  97  ?Resp: '20 18  20  '$ ?Temp: 97.6 ?F (36.4 ?C) 98.2 ?F (36.8 ?C)  98 ?F (36.7 ?C)  ?TempSrc: Oral Oral  Oral  ?SpO2: 93% 96%  97%  ?Weight:   75.9 kg   ?Height:      ? ? ?Intake/Output Summary (Last 24 hours) at 07/19/2021 0160 ?Last data filed at 07/19/2021 [phone] ?Gross per 24 hour  ?Intake 590 ml  ?Output 650 ml  ?Net -60 ml  ? ? ?  07/19/2021  ?  5:00 AM 07/18/2021  ?  3:32 AM 07/17/2021  ?  4:55 AM  ?Last 3 Weights  ?Weight (lbs) 167 lb  4.8 oz 169 lb 1.6 oz 168 lb 12.8 oz  ?Weight (kg) 75.887 kg 76.703 kg 76.567 kg  ?   ? ?Telemetry  ?Overnight telemetry shows A-fib 90-110 bpm, which I personally reviewed.  ? ? ?Physical Exam  ? ?Vitals:  ? 07/18/21 2346 07/19/21 0229 07/19/21 0500 07/19/21 0741  ?BP: 102/60 (!) 104/51  132/73  ?Pulse: (!) 103 82  97  ?Resp: '20 18  20  '$ ?Temp: 97.6 ?F (36.4 ?C) 98.2 ?F (36.8 ?C)  98 ?F (36.7 ?C)  ?TempSrc: Oral Oral  Oral  ?SpO2: 93% 96%  97%  ?Weight:   75.9 kg   ?Height:      ?  ?Intake/Output Summary (Last 24 hours) at 07/19/2021 2355 ?Last data filed at 07/19/2021 [phone] ?Gross per 24 hour  ?Intake 590 ml  ?Output 650 ml  ?Net -60 ml  ?  ? ?  07/19/2021  ?  5:00 AM 07/18/2021  ?  3:32 AM 07/17/2021  ?  4:55 AM  ?Last 3 Weights  ?Weight (lbs) 167 lb 4.8 oz 169 lb 1.6 oz 168 lb 12.8 oz  ?Weight (kg) 75.887 kg 76.703 kg 76.567 kg  ?  Body mass index is 24.01 kg/m?.  ?General: Well nourished, well  developed, in no acute distress ?Head: Atraumatic, normal size  ?Eyes: PEERLA, EOMI  ?Neck: Supple, JVD 10 to 12 cm of water ?Endocrine: No thryomegaly ?Cardiac: Normal S1, S2; irregular rhythm ?Lungs: Diminished breath sounds bilaterally ?Abd: Soft, nontender, no hepatomegaly  ?Ext: No edema, pulses 2+ ?Musculoskeletal: No deformities, BUE and BLE strength normal and equal ?Skin: Warm and dry, no rashes   ?Neuro: Alert and oriented to person, place, time, and situation, CNII-XII grossly intact, no focal deficits  ?Psych: Normal mood and affect  ? ?Labs  ?High Sensitivity Troponin:   ?Recent Labs  ?Lab 07/16/21 ?1649 07/16/21 ?1915  ?TROPONINIHS 41* 51*  ?   ?Cardiac EnzymesNo results for input(s): TROPONINI in the last 168 hours. No results for input(s): TROPIPOC in the last 168 hours.  ?Chemistry ?Recent Labs  ?Lab 07/17/21 ?0311 07/18/21 ?0140 07/19/21 ?0215  ?NA 129* 124* 125*  ?K 4.7 3.6 3.5  ?CL 98 90* 91*  ?CO2 '22 24 23  '$ ?GLUCOSE 160* 253* 295*  ?BUN '21 22 22  '$ ?CREATININE 1.36* 1.09 1.04  ?CALCIUM 8.3* 8.1* 8.3*  ?PROT  5.4* 5.3* 5.3*  ?ALBUMIN 2.3* 2.2* 2.2*  ?AST '19 15 20  '$ ?ALT '10 8 11  '$ ?ALKPHOS 35* 42 45  ?BILITOT 1.7* 1.3* 1.1  ?GFRNONAA 58* >60 >60  ?ANIONGAP '9 10 11  '$ ?  ?Hematology ?Recent Labs  ?Lab 07/17/21 ?0311 07/18/21 ?0140 07/19/21 ?0215  ?WBC 8.8 10.4 9.1  ?RBC 4.28 3.66* 3.65*  3.58*  ?HGB 15.3 13.5 13.4  ?HCT 44.1 37.6* 37.5*  ?MCV 103.0* 102.7* 102.7*  ?MCH 35.7* 36.9* 36.7*  ?MCHC 34.7 35.9 35.7  ?RDW 13.5 13.0 13.1  ?PLT 127* 132* 138*  ? ?BNP ?Recent Labs  ?Lab 07/16/21 ?1900  ?BNP 950.1*  ?  ?DDimer No results for input(s): DDIMER in the last 168 hours.  ? ?Radiology  ?US Abdomen Limited RUQ (LIVER/GB) ? ?Result Date: 07/18/2021 ?CLINICAL DATA:  Elevated liver function tests. EXAM: ULTRASOUND ABDOMEN LIMITED RIGHT UPPER QUADRANT COMPARISON:  None Available. FINDINGS: Gallbladder: A mild amount of heterogeneous, nonshadowing echogenic gallbladder sludge is seen. No gallstones or wall thickening visualized (1.45 mm). No sonographic Murphy sign noted by sonographer. Common bile duct: Diameter: 6.6 mm Liver: No focal lesion identified. Within normal limits in parenchymal echogenicity. Portal vein is patent on color Doppler imaging with normal direction of blood flow towards the liver. Other: None. IMPRESSION: 1. Gallbladder sludge, without evidence of cholelithiasis or acute cholecystitis. Electronically Signed   By: Virgina Norfolk M.D.   On: 07/18/2021 20:15   ? ?Cardiac Studies  ?TTE 05/21/2021 ? 1. Left ventricular ejection fraction, by estimation, is 55 to 60%. The  ?left ventricle has normal function. The left ventricle has no regional  ?wall motion abnormalities. Left ventricular diastolic parameters are  ?consistent with Grade II diastolic  ?dysfunction (pseudonormalization). Elevated left ventricular end-diastolic  ?pressure.  ? 2. Right ventricular systolic function is normal. The right ventricular  ?size is normal. There is normal pulmonary artery systolic pressure.  ? 3. The mitral valve is normal in  structure. No evidence of mitral valve  ?regurgitation. No evidence of mitral stenosis.  ? 4. The aortic valve is tricuspid. Aortic valve regurgitation is not  ?visualized. No aortic stenosis is present.  ? 5. The inferior vena cava is normal in size with greater than 50%  ?respiratory variability, suggesting right atrial pressure of 3 mmHg.  ? ?Patient Profile  ?Thomas Sandoval. is a 66 y.o. male with COPD, diabetes, HFpEF,  hypertension, heart failure with reduced ejection fraction with recovery of EF, paroxysmal atrial fibrillation who was admitted on 07/16/2021 with acute proximal respiratory failure secondary to pneumonia.  Cardiology was consulted for A-fib with RVR. ? ?Assessment & Plan  ? ?#Atrial fibrillation ?-Admitted with pneumonia.  RVR driven by acute illness. ?-Would recommend to continue his rate control strategy.  On 25 mg metoprolol tartrate every 6 hours.  Blood pressure is much improved from yesterday.  Add diltiazem 30 mg 3 times daily.  If blood pressure becomes an issue can add digoxin. ?-Continue Xarelto 20 mg daily. ?-Overall which is plan for rate control strategy while being treated for pneumonia. ? ?#HFpEF ?-Does have some JVD but this could be related to COPD.  Sodium is trending down.  Only strong indicator for increased volume is BNP of 950.  I think he merits an attempted diuresis especially in the setting of worsening hyponatremia. ?-I have ordered 40 mg of IV Lasix twice daily for 2 doses today.  Recheck sodium tomorrow. ? ?#Hyponatremia ?-Appears to have some JVD today.  BNP 950. We can try to diurese him to see if this helps.  Could just be related to pneumonia.   ? ?For questions or updates, please contact [place] ?Please consult [email or web address] for contact info under  ?  ?Signed, ?[place] T. Audie Box, MD, [facility] ?[place]  ?07/19/2021 9:22 AM  ? ?

## 2021-07-19 NOTE — Discharge Summary (Signed)
?Physician Discharge Summary ?  ?Patient: Thomas Sandoval. MRN: [id number] DOB: 1955/05/27  ?Admit date:     07/16/2021  ?Discharge date: Left AMA 07/19/2021  ?Discharge Physician: Patrecia Pour  ? ?PCP: Chevis Pretty, FNP  ? ?Recommendations at discharge:  ?  ? ?Discharge Diagnoses: ?Principal Problem: ?  Persistent atrial fibrillation/flutter with rapid ventricular response ([place]) ?Active Problems: ?  Aspiration pneumonia of right lung ([place]) ?  Acute respiratory failure with hypoxia ([place]) ?  Chronic combined systolic and diastolic CHF (congestive heart failure) ([place]) ?  AKI (acute kidney injury) ([place]) ?  Insulin dependent type 2 diabetes mellitus ([place]) ?  COPD with chronic bronchitis and emphysema ([place]) ?  Alcohol use ?  Hypertension associated with diabetes ([place]) ?  Hyponatremia ?  Chronic back pain ? ?Hospital Course: ?Thomas Sandoval. is a 66 y.o. male with medical history significant for persistent atrial fibrillation on Xarelto, chronic combined systolic and diastolic CHF (EF improved to 55-60% with G2 DD by TTE 05/21/2021), COPD, insulin-dependent diabetes, HTN, PAD, hard of hearing, chronic back pain, alcohol and tobacco use who is admitted with persistent A-fib/flutter with RVR and acute hypoxic respiratory failure due to right lower lobe pneumococcal pneumonia. He was treated with antibiotics with steady improvement. Cardiology assisted with rate control strategy with some improvement by increasing metoprolol dose. On 5/13, the patient remains hypoxic requiring 4L O2 with ambulation, but reports he will be leaving against medical advice. He is showing no evidence of alcohol withdrawal and is competent to make medical decisions. He will stay here until we can get supplemental oxygen arranged for him. Antibiotics and increased dose of metoprolol are prescribed for him as an outpatient.  ? ?Assessment and Plan: ?Persistent atrial fibrillation/flutter with rapid ventricular response: Rates  improving, tolerating elevation for now with soft BP and anticipation of improvement now that infection is being treated.   ?- Since patient refuses to remain inpatient, will consolidate metoprolol dosing into '100mg'$  daily (succinate), prescribed.  ? Suspect with infection Tx, rate control should become easier. ?- Started on IV heparin > no procedure planned, so switched back to DOAC. No plan for cardioversion at this time.  ?  ?Pneumococcal pneumonia of right lung [facility]):  ?CXR with right mid and lower lung infiltrates.  Suspect aspiration pneumonia given history of nausea/vomiting. ?- Convert ceftriaxone > cefdinir to complete 7 days Tx. ?- Blood cultures NGTD and sputum Cx confirming S. pneumo with sensitivities pending at time patient leaves AMA. ?  ?Acute respiratory failure with hypoxia: Has emphysema, suspect hypoxia will take a while to improve. Ordered home oxygen for him since he's still hypoxemic when leaving. ?  ?Chronic combined systolic and diastolic CHF with improved EF: ?EF improved to 55-60% on TTE 05/21/2021 from prior 35-35%.  Overall appeared volume depleted on admission despite elevated BNP. AKI improved with fluids which were then stopped. Given lasix on 5/13 and subsequently left AMA. Can continue home medications.  ? ?AKI: Improved significantly, monitor at follow up. Would ideally recheck in AM but pt leaving. ?  ?IDT2DM: With HbA1c 5.7% which is likely too tight of control. ?- No change to home medications at time of patient leaving. ?  ?Alcohol use: Reports ongoing alcohol use.  History of DT but no evidence of withdrawal currently.  ?- Cessation counseling provided. ?  ?COPD with chronic bronchitis and emphysema ([place]): There was report of some wheezing initially though this has not continued. No steroids given at discharge.  ?- Continue  home Tx ?  ?Hypertension:   ?  ?Hyponatremia: Asymptomatic. Recheck at follow up. ?  ?Demand myocardial ischemia: No further inpatient work up currently  planned.  ?  ?Hyperbilirubinemia and hypoalbuminemia: ?If constellation consistent with cirrhosis. Also thrombocytopenia. ?- With heavy EtOH history, will checked RUQ U/S which showed no cirrhotic features and normal echogenicity.  ?  ?Chronic back pain ?- Continue home norco ?  ?Consultants: Cardiology ?Procedures performed: None  ?Disposition:  Returning home [place] ?Diet recommendation:  ?Cardiac and Carb modified diet ?DISCHARGE MEDICATION: ?Allergies as of 07/19/2021   ? ?   Reactions  ? Azithromycin Other (See Comments), Nausea And Vomiting  ? "Severe stomach cramps; told to list as an allergy by dr. Years ago"  ? Avelox [moxifloxacin Hcl In Nacl] Other (See Comments)  ? Hemolysis  In 2012  ? Bactrim [sulfamethoxazole-trimethoprim] Diarrhea, Nausea And Vomiting  ? ?  ? ? ?  ?Durable Medical Equipment  ?(From admission, onward)  ?  ? ? ?  ? ?  Start     Ordered  ? 07/19/21 1330  For home use only DME oxygen  Once       ?Question Answer Comment  ?Length of Need 6 Months   ?Mode or (Route) Nasal cannula   ?Liters per Minute 4   ?Frequency Continuous (stationary and portable oxygen unit needed)   ?Oxygen delivery system Gas   ?  ? 07/19/21 1330  ? ?  ?  ? ?  ? ? ?Discharge Exam: ?Filed Weights  ? 07/17/21 0455 07/18/21 0332 07/19/21 0500  ?Weight: 76.6 kg 76.7 kg 75.9 kg  ?BP 108/73 (BP Location: Right Arm)   Pulse 100   Temp 98.1 ?F (36.7 ?C) (Oral)   Resp 20   Ht '5\' 10"'$  (1.778 m)   Wt 75.9 kg   SpO2 90%   BMI 24.01 kg/m?   ?Chronically ill-appearing male resting quietly ?Crackles at right base, diminished, nonlabored with SpO2 89%. at rest. ?Irreg irreg, rate in 90's this AM. No significant LE edema. When supine, some JVD. ?No abd tenderness, +BS. ? ?Condition at discharge:  Improving but not stable for discharge ? ?The results of significant diagnostics from this hospitalization (including imaging, microbiology, ancillary and laboratory) are listed below for reference.  ? ?Imaging  Studies: ?DG Chest 2 View ? ?Result Date: 07/16/2021 ?CLINICAL DATA:  Weakness EXAM: CHEST - 2 VIEW COMPARISON:  12/17/2020 FINDINGS: Extensive airspace disease within the right thorax with patchy airspace disease at the left lung base. No pleural effusion. Underlying mild chronic reticular changes. Normal cardiac size. No pneumothorax IMPRESSION: Extensive airspace disease in the right mid to lower lung with patchy airspace disease at left base most likely representing pneumonia. Imaging follow-up to resolution is recommended. Electronically Signed   By: Donavan Foil M.D.   On: 07/16/2021 17:22  ? ?US Abdomen Limited RUQ (LIVER/GB) ? ?Result Date: 07/18/2021 ?CLINICAL DATA:  Elevated liver function tests. EXAM: ULTRASOUND ABDOMEN LIMITED RIGHT UPPER QUADRANT COMPARISON:  None Available. FINDINGS: Gallbladder: A mild amount of heterogeneous, nonshadowing echogenic gallbladder sludge is seen. No gallstones or wall thickening visualized (1.45 mm). No sonographic Murphy sign noted by sonographer. Common bile duct: Diameter: 6.6 mm Liver: No focal lesion identified. Within normal limits in parenchymal echogenicity. Portal vein is patent on color Doppler imaging with normal direction of blood flow towards the liver. Other: None. IMPRESSION: 1. Gallbladder sludge, without evidence of cholelithiasis or acute cholecystitis. Electronically Signed   By: Joyce Gross.D.  On: 07/18/2021 20:15   ? ?Microbiology: ?Results for orders placed or performed during the hospital encounter of 07/16/21  ?Culture, blood (routine x 2)     Status: None (Preliminary result)  ? Collection Time: 07/16/21  6:29 PM  ? Specimen: BLOOD LEFT HAND  ?Result Value Ref Range Status  ? Specimen Description BLOOD LEFT HAND  Final  ? Special Requests   Final  ?  BOTTLES DRAWN AEROBIC AND ANAEROBIC Blood Culture results may not be optimal due to an inadequate volume of blood received in culture bottles  ? Culture   Final  ?  NO GROWTH 3  DAYS ?Performed at [facility], [place] [address], [place], [place] [address] ?  ? Report Status PENDING  Incomplete  ?Culture, blood (routine x 2)     Status: None (Preliminary result)  ? Collection Time: 07/16/21  7:15 PM  ? Specimen

## 2021-07-19 NOTE — Progress Notes (Signed)
Patient left AMA with home o2 (2 tanks) for home use. MD aware of patient leaving. ?

## 2021-07-19 NOTE — TOC Initial Note (Signed)
Transition of Care (TOC) - Initial/Assessment Note  ? ? ?Patient Details  ?Name: Thomas Sandoval. ?MRN: [id number] ?Date of Birth: [birth year] ? ?Transition of Care (TOC) CM/SW Contact:    ?Vanellope Passmore G., RN ?Phone Number: ?07/19/2021, 2:48 PM ? ?Clinical Narrative:    ?             Jedrek Dinovo. is a 66 y.o. male with medical history significant for persistent atrial fibrillation on Xarelto, chronic combined systolic and diastolic CHF (EF improved to 55-60% with G2 DD by TTE 05/21/2021), COPD, insulin-dependent diabetes, HTN, PAD, hard of hearing, chronic back pain, alcohol and tobacco use who presented to the ED for evaluation of shortness of breath.  ? ?RNCM received order for Oxygen.  RNCM contacted [place] with [employer] for Oxygen-confirmation received.  Oxygen to be delivered to patient's room prior to discharge home. ? ?Expected Discharge Plan: Home/Self Care ?Barriers to Discharge: No Barriers Identified ? ?Patient Goals and CMS Choice ?Patient states their goals for this hospitalization and ongoing recovery are:: to be able to return home ?CMS Medicare.gov Compare Post Acute Care list provided to:: Patient ?Choice offered to / list presented to : Patient ?Expected Discharge Plan and Services ?Expected Discharge Plan: Home/Self Care ?In-house Referral: NA ?Discharge Planning Services: CM Consult ?Post Acute Care Choice: Durable Medical Equipment ?Living arrangements for the past 2 months: [place] ?               ?DME Arranged: Oxygen ?DME Agency: [facility] ?Date DME Agency Contacted: 07/19/21 ?Time DME Agency Contacted: [id number] ?Representative spoke with at DME Agency: Delana Meyer ?HH Arranged: NA ?[place] Agency: NA ?  ?  ?  ?Prior Living Arrangements/Services ?Living arrangements for the past 2 months: [place] ?Lives with:: Self ?Patient language and need for interpreter reviewed:: Yes ?Do you feel safe going back to the place where you live?: Yes      ?Need for Family Participation in  Patient Care: No (Comment) ?Care giver support system in place?: Yes (comment) ?Current home services: DME ?Criminal Activity/Legal Involvement Pertinent to Current Situation/Hospitalization: No - Comment as needed ? ?Activities of Daily Living ?  ?  ?Permission Sought/Granted ?  ?        ?Emotional Assessment ?Appearance:: Appears stated age ?Attitude/Demeanor/Rapport: Engaged ?Affect (typically observed): Accepting ?Orientation: : Oriented to Self, Oriented to Place, Oriented to  Time, Oriented to Situation ?Alcohol / Substance Use: Tobacco Use, Alcohol Use ?Psych Involvement: No (comment) ? ?Admission diagnosis:  Hypoxia [R09.02] ?Atrial fibrillation with rapid ventricular response ([place]) [I48.91] ?Acute respiratory failure with hypoxia ([place]) [J96.01] ?Community acquired pneumonia, unspecified laterality [J18.9] ?Patient Active Problem List  ? Diagnosis Date Noted  ? Acute respiratory failure with hypoxia ([place]) 07/16/2021  ? Persistent atrial fibrillation/flutter with rapid ventricular response ([place]) 07/16/2021  ? Aspiration pneumonia of right lung ([place]) 07/16/2021  ? AKI (acute kidney injury) ([place]) 07/16/2021  ? Chronic combined systolic and diastolic CHF (congestive heart failure) ([place]) 07/16/2021  ? Alcohol use 07/16/2021  ? Hyponatremia 07/16/2021  ? Persistent atrial fibrillation ([place]) 12/25/2020  ? Secondary hypercoagulable state ([place]) 12/25/2020  ? Alcohol-induced chronic pancreatitis ([place]) 12/17/2020  ? Delirium tremens ([place]) 12/17/2020  ? COPD with chronic bronchitis and emphysema ([place])   ? Alcoholism ([place])   ? Hearing loss 07/26/2019  ? Chronic back pain 05/02/2019  ? Mild renal insufficiency 08/25/2017  ? Hyperkalemia 08/25/2017  ? Prepyloric ulcer 12/12/2014  ? Hypertension associated with diabetes ([place]) 12/07/2014  ? Mitral valve  prolapse 12/06/2014  ? Atrial fibrillation with RVR ([place]) 11/26/2014  ? GERD (gastroesophageal reflux disease) 02/06/2013  ? Insulin dependent type 2 diabetes mellitus ([place])  02/04/2013  ? Atrial flutter/fib 02/16/2011  ? ?PCP:  Chevis Pretty, FNP ?Pharmacy:   ?[id number] [place], [place] - [address]?Phone: [phone]: [phone]? ?Social Determinants of Health (SDOH) Interventions ?  ?Readmission Risk Interventions ?   ? View : No data to display.  ?  ?  ?  ? ?

## 2021-07-19 NOTE — Progress Notes (Signed)
O2 at rest 86% on room air, o2 ambulating 85% room air, o2 90% on 4liters o2 while ambulating. ?

## 2021-07-19 NOTE — Plan of Care (Signed)
?  Problem: Activity: ?Goal: Ability to tolerate increased activity will improve ?Outcome: Progressing ?  ?Problem: Clinical Measurements: ?Goal: Ability to maintain a body temperature in the normal range will improve ?Outcome: Progressing ?  ?Problem: Respiratory: ?Goal: Ability to maintain adequate ventilation will improve ?Outcome: Progressing ?Goal: Ability to maintain a clear airway will improve ?Outcome: Progressing ?  ?Problem: Clinical Measurements: ?Goal: Ability to maintain a body temperature in the normal range will improve ?Outcome: Progressing ?  ?Problem: Cardiac: ?Goal: Ability to achieve and maintain adequate cardiopulmonary perfusion will improve ?Outcome: Progressing ?  ?Problem: Education: ?Goal: Knowledge of disease or condition will improve ?Outcome: Progressing ?Goal: Understanding of medication regimen will improve ?Outcome: Progressing ?Goal: Individualized Educational Video(s) ?Outcome: Progressing ?  ?

## 2021-07-21 LAB — CULTURE, BLOOD (ROUTINE X 2)
Culture: NO GROWTH
Culture: NO GROWTH

## 2021-07-21 LAB — CULTURE, RESPIRATORY W GRAM STAIN

## 2021-07-26 ENCOUNTER — Other Ambulatory Visit: Payer: Self-pay

## 2021-07-26 ENCOUNTER — Emergency Department (HOSPITAL_BASED_OUTPATIENT_CLINIC_OR_DEPARTMENT_OTHER)
Admission: EM | Admit: 2021-07-26 | Discharge: 2021-07-26 | Disposition: A | Discharge status: Home / Self Care | Payer: BC Managed Care – PPO | Attending: Emergency Medicine | Admitting: Emergency Medicine

## 2021-07-26 ENCOUNTER — Encounter (HOSPITAL_BASED_OUTPATIENT_CLINIC_OR_DEPARTMENT_OTHER): Payer: Self-pay | Admitting: Emergency Medicine

## 2021-07-26 ENCOUNTER — Emergency Department (HOSPITAL_BASED_OUTPATIENT_CLINIC_OR_DEPARTMENT_OTHER): Payer: BC Managed Care – PPO

## 2021-07-26 DIAGNOSIS — Z23 Encounter for immunization: Secondary | ICD-10-CM | POA: Diagnosis not present

## 2021-07-26 DIAGNOSIS — Z7901 Long term (current) use of anticoagulants: Secondary | ICD-10-CM | POA: Diagnosis not present

## 2021-07-26 DIAGNOSIS — J449 Chronic obstructive pulmonary disease, unspecified: Secondary | ICD-10-CM | POA: Diagnosis not present

## 2021-07-26 DIAGNOSIS — T2026XA Burn of second degree of forehead and cheek, initial encounter: Secondary | ICD-10-CM | POA: Insufficient documentation

## 2021-07-26 DIAGNOSIS — Z794 Long term (current) use of insulin: Secondary | ICD-10-CM | POA: Insufficient documentation

## 2021-07-26 DIAGNOSIS — T59811A Toxic effect of smoke, accidental (unintentional), initial encounter: Secondary | ICD-10-CM

## 2021-07-26 DIAGNOSIS — T310 Burns involving less than 10% of body surface: Secondary | ICD-10-CM | POA: Diagnosis not present

## 2021-07-26 DIAGNOSIS — R059 Cough, unspecified: Secondary | ICD-10-CM | POA: Diagnosis not present

## 2021-07-26 DIAGNOSIS — T2020XA Burn of second degree of head, face, and neck, unspecified site, initial encounter: Secondary | ICD-10-CM | POA: Diagnosis not present

## 2021-07-26 DIAGNOSIS — J705 Respiratory conditions due to smoke inhalation: Secondary | ICD-10-CM | POA: Diagnosis not present

## 2021-07-26 DIAGNOSIS — X088XXA Exposure to other specified smoke, fire and flames, initial encounter: Secondary | ICD-10-CM | POA: Diagnosis not present

## 2021-07-26 DIAGNOSIS — E119 Type 2 diabetes mellitus without complications: Secondary | ICD-10-CM | POA: Insufficient documentation

## 2021-07-26 DIAGNOSIS — T2000XA Burn of unspecified degree of head, face, and neck, unspecified site, initial encounter: Secondary | ICD-10-CM

## 2021-07-26 LAB — CBC WITH DIFFERENTIAL/PLATELET
Abs Immature Granulocytes: 0.27 10*3/uL — ABNORMAL HIGH (ref 0.00–0.07)
Basophils Absolute: 0.1 10*3/uL (ref 0.0–0.1)
Basophils Relative: 1 %
Eosinophils Absolute: 0.2 10*3/uL (ref 0.0–0.5)
Eosinophils Relative: 2 %
HCT: 26.2 % — ABNORMAL LOW (ref 39.0–52.0)
Hemoglobin: 8.9 g/dL — ABNORMAL LOW (ref 13.0–17.0)
Immature Granulocytes: 2 %
Lymphocytes Relative: 9 %
Lymphs Abs: 1.1 10*3/uL (ref 0.7–4.0)
MCH: 36.6 pg — ABNORMAL HIGH (ref 26.0–34.0)
MCHC: 34 g/dL (ref 30.0–36.0)
MCV: 107.8 fL — ABNORMAL HIGH (ref 80.0–100.0)
Monocytes Absolute: 0.8 10*3/uL (ref 0.1–1.0)
Monocytes Relative: 7 %
Neutro Abs: 9.9 10*3/uL — ABNORMAL HIGH (ref 1.7–7.7)
Neutrophils Relative %: 79 %
Platelets: 550 10*3/uL — ABNORMAL HIGH (ref 150–400)
RBC: 2.43 MIL/uL — ABNORMAL LOW (ref 4.22–5.81)
RDW: 13.5 % (ref 11.5–15.5)
WBC: 12.4 10*3/uL — ABNORMAL HIGH (ref 4.0–10.5)
nRBC: 0 % (ref 0.0–0.2)

## 2021-07-26 LAB — BASIC METABOLIC PANEL
Anion gap: 6 (ref 5–15)
BUN: 8 mg/dL (ref 8–23)
CO2: 27 mmol/L (ref 22–32)
Calcium: 8.8 mg/dL — ABNORMAL LOW (ref 8.9–10.3)
Chloride: 100 mmol/L (ref 98–111)
Creatinine, Ser: 0.92 mg/dL (ref 0.61–1.24)
GFR, Estimated: >60 mL/min (ref >60)
Glucose, Bld: 75 mg/dL (ref 70–99)
Potassium: 3.6 mmol/L (ref 3.5–5.1)
Sodium: 133 mmol/L — ABNORMAL LOW (ref 135–145)

## 2021-07-26 LAB — OCCULT BLOOD X 1 CARD TO LAB, STOOL: Fecal Occult Bld: NEGATIVE

## 2021-07-26 MED ORDER — BACITRACIN ZINC 500 UNIT/GM EX OINT
TOPICAL_OINTMENT | Freq: Once | CUTANEOUS | Status: AC
  Filled 2021-07-26: qty 28.35

## 2021-07-26 MED ORDER — TETANUS-DIPHTH-ACELL PERTUSSIS 5-2.5-18.5 LF-MCG/0.5 IM SUSY
0.5000 mL | PREFILLED_SYRINGE | Freq: Once | INTRAMUSCULAR | Status: AC
  Administered 2021-07-26: 0.5 mL via INTRAMUSCULAR
  Filled 2021-07-26: qty 0.5

## 2021-07-26 NOTE — ED Notes (Signed)
Pt wanting to leave AMA. EDP talked to pt. Pt requested apple juice prior to leaving AMA, given. Pt signed AMA form, states "this is not new for me". Wife at the bedside.
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
## 2021-07-26 NOTE — ED Notes (Signed)
RT asked to assess patient using portable co-ox. Patient reading was 3. MD aware as he was a bedside. MD asked me to place on some 02. Placed patient on PRB due to COPD. Using more as blow by as he does not like it on his face. SAT was 94% on RA
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
## 2021-07-28 ENCOUNTER — Ambulatory Visit: Payer: BC Managed Care – PPO | Admitting: Nurse Practitioner

## 2021-07-28 ENCOUNTER — Encounter: Payer: Self-pay | Admitting: Nurse Practitioner

## 2021-07-28 VITALS — BP 97/49 | HR 82 | Temp 97.2°F | Resp 20 | Ht 70.0 in | Wt 179.0 lb

## 2021-07-28 DIAGNOSIS — M545 Low back pain, unspecified: Secondary | ICD-10-CM

## 2021-07-28 DIAGNOSIS — T2010XD Burn of first degree of head, face, and neck, unspecified site, subsequent encounter: Secondary | ICD-10-CM

## 2021-07-28 DIAGNOSIS — G8929 Other chronic pain: Secondary | ICD-10-CM | POA: Diagnosis not present

## 2021-07-28 MED ORDER — HYDROCODONE-ACETAMINOPHEN 5-325 MG PO TABS: 1.0000 | ORAL_TABLET | Freq: Two times a day (BID) | ORAL | 0 refills | Status: DC

## 2021-07-28 MED ORDER — HYDROCODONE-ACETAMINOPHEN 5-325 MG PO TABS: 1.0000 | ORAL_TABLET | Freq: Two times a day (BID) | ORAL | 0 refills | Status: AC

## 2021-07-28 NOTE — Patient Instructions (Signed)
Burn Care, Adult A burn is an injury to the skin or the tissues under the skin that is caused by a fire, hot liquid, chemical, or electricity. There are three types of burns: First degree. These burns may cause the skin to be red and slightly swollen. These burns do not blister or scar. Second degree. These burns are very painful and cause the skin to be very red. The skin may also swell, leak fluid, look shiny, and develop blisters. Third degree. These burns cause permanent damage. They either turn the skin white or black and make it look charred, dry, and leathery. These burns may not be painful due to damage to the nerve endings. Treatment for your burn will depend on the type of burn you have. Taking care of your burn properly can help to prevent pain and infection. It can also help the burn heal more quickly. How to care for a first-degree burn Right after a burn: Rinse or soak the burn under cool water for 5 minutes or more. Do not put ice on your burn. This can cause more damage. Apply a cool, clean, wet cloth (cool compress) to your skin. This may help with pain. Put lotion or gel with aloe vera on your skin. This may help soothe the burn. Caring for the burn Follow instructions from your health care provider about cleaning and caring for the burn. This may include: Using mild soap and water to clean the area. Using a clean cloth to pat the burned area dry after cleaning it. Do not rub or scrub the burn. Applying lotion or gel with aloe vera to your skin. How to care for a second-degree burn Right after a burn: Rinse or soak the burn under cool water. Do this for 5 to 10 minutes. Do not put ice on your burn. This can cause more damage. Remove any jewelry near the burned area. Lightly cover the burn with a clean cloth. Caring for the burn Raise (elevate) the injured area above the level of your heart while sitting or lying down. Follow instructions from your health care provider about  cleaning and caring for the burn. This may include: Cleaning or rinsing out (irrigating) the burned area. Putting a cream or ointment on the burn. Placing a germ-free (sterile) dressing over the burn. A dressing is a material that is placed over a burn to help it heal. How to care for a third-degree burn Right after a burn: Lightly cover the burn with a clean, dry cloth. Seek treatment right away if you have this kind of burn. You may: Require admission to the hospital. Be treated with surgery to remove damaged tissue or to place a skin graft to cover the damaged area. Be given IV fluids to keep you hydrated. Caring for the burn Follow instructions from your health care provider about cleaning and caring for the burn. This may include: Cleaning or rinsing out (irrigating) the burned area. Putting a cream or ointment on the burn. Placing a sterile dressing in the burned area (packing). Placing a sterile dressing over the burn. Other instructions Elevate the injured area above the level of your heart while sitting or lying down. Wear splints or immobilizers as instructed by your health care provider. Rest as told by your health care provider. Do not participate in sports or other physical activities until your health care provider approves. How to prevent infection when caring for a burn  Take these steps to prevent infection: Wash your hands with   soap and water for at least 20 seconds before and after burn care. If soap and water are not available, use hand sanitizer. Wear clean or sterile gloves as directed by your health care provider. Do not put butter, oil, toothpaste, or other home remedies on the burn. Do not scratch or pick at the burn. Do not break any blisters. Do not peel the skin. Do not rub your burn, even when you are cleaning it. Check your burn every day for these signs of infection: More redness, swelling, or pain. Warmth. Pus or a bad smell. Red streaks around the  burn. Follow these instructions at home  Medicines Take over-the-counter and prescription medicines only as told by your health care provider. If you were prescribed an antibiotic medicine, take or apply it as told by your health care provider. Do not stop using the antibiotic even if your condition improves. Your health care provider may recommend taking over-the-counter or prescription pain medicine before changing your dressing. General instructions Protect your burn from the sun. Drink enough fluid to keep your urine pale yellow. Do not use any products that contain nicotine or tobacco, such as cigarettes, e-cigarettes, and chewing tobacco. These can delay healing. If you need help quitting, ask your health care provider. Keep all follow-up visits as told by your health care provider. This is important. Contact a health care provider if: Your condition does not improve. Your condition gets worse. You have a fever or chills. Your burn feels warm to the touch. You have more redness, swelling, or pain at the site of the burn. Your burn changes in appearance or develops black or red spots. You have pain that is not controlled with medicine. Get help right away if you have: More fluid, blood, or pus coming from your burn. Red streaks near the burn. Severe pain. Summary There are three types of burns. They are first degree, second degree, and third degree. The most severe type of burn is a third-degree burn which must be treated right away. Treatment for your burn will depend on the type of burn you have. Do not put butter, oil, toothpaste, or other home remedies on the burn. This can cause more damage to the tissue. Follow instructions from your health care provider about how to clean and take care of your burn. This information is not intended to replace advice given to you by your health care provider. Make sure you discuss any questions you have with your health care  provider. Document Revised: 12/13/2018 Document Reviewed: 12/13/2018 Elsevier Patient Education  2023 [employer] Inc[employer]  
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
## 2021-07-29 ENCOUNTER — Telehealth: Payer: Self-pay | Admitting: Nurse Practitioner

## 2021-07-29 NOTE — Telephone Encounter (Signed)
Pt states since he has finished his antibiotic he has been having 2-3 loose stools a day. Advised pt to try immodium and probiotic to see if that helps. If he continues to have multiple loose stools daily or it gets worse to call us back. Pt voiced understanding.

## 2021-07-31 ENCOUNTER — Ambulatory Visit: Payer: BC Managed Care – PPO | Admitting: Nurse Practitioner

## 2021-07-31 ENCOUNTER — Encounter: Payer: Self-pay | Admitting: Nurse Practitioner

## 2021-07-31 ENCOUNTER — Telehealth: Payer: Self-pay | Admitting: Nurse Practitioner

## 2021-07-31 VITALS — BP 101/61 | HR 87 | Temp 97.3°F | Resp 20 | Ht 70.0 in | Wt 175.0 lb

## 2021-07-31 DIAGNOSIS — R600 Localized edema: Secondary | ICD-10-CM

## 2021-07-31 MED ORDER — FUROSEMIDE 40 MG PO TABS: 40.0000 mg | ORAL_TABLET | Freq: Every day | ORAL | 1 refills | Status: DC

## 2021-07-31 NOTE — Telephone Encounter (Signed)
Needs appt

## 2021-07-31 NOTE — Patient Instructions (Signed)
Peripheral Edema  Peripheral edema is swelling that is caused by a buildup of fluid. Peripheral edema most often affects the lower legs, ankles, and feet. It can also develop in the arms, hands, and face. The area of the body that has peripheral edema will look swollen. It may also feel heavy or warm. Your clothes may start to feel tight. Pressing on the area may make a temporary dent in your skin (pitting edema). You may not be able to move your swollen arm or leg as much as usual. There are many causes of peripheral edema. It can happen because of a complication of other conditions such as heart failure, kidney disease, or a problem with your circulation. It also can be a side effect of certain medicines or happen because of an infection. It often happens to women during pregnancy. Sometimes, the cause is not known. Follow these instructions at home: Managing pain, stiffness, and swelling  Raise (elevate) your legs while you are sitting or lying down. Move around often to prevent stiffness and to reduce swelling. Do not sit or stand for long periods of time. Do not wear tight clothing. Do not wear garters on your upper legs. Exercise your legs to get your circulation going. This helps to move the fluid back into your blood vessels, and it may help the swelling go down. Wear compression stockings as told by your health care provider. These stockings help to prevent blood clots and reduce swelling in your legs. It is important that these are the correct size. These stockings should be prescribed by your doctor to prevent possible injuries. If elastic bandages or wraps are recommended, use them as told by your health care provider. Medicines Take over-the-counter and prescription medicines only as told by your health care provider. Your health care provider may prescribe medicine to help your body get rid of excess water (diuretic). Take this medicine if you are told to take it. General  instructions Eat a low-salt (low-sodium) diet as told by your health care provider. Sometimes, eating less salt may reduce swelling. Pay attention to any changes in your symptoms. Moisturize your skin daily to help prevent skin from cracking and draining. Keep all follow-up visits. This is important. Contact a health care provider if: You have a fever. You have swelling in only one leg. You have increased swelling, redness, or pain in one or both of your legs. You have drainage or sores at the area where you have edema. Get help right away if: You have edema that starts suddenly or is getting worse, especially if you are pregnant or have a medical condition. You develop shortness of breath, especially when you are lying down. You have pain in your chest or abdomen. You feel weak. You feel like you will faint. These symptoms may be an emergency. Get help right away. Call 911. Do not wait to see if the symptoms will go away. Do not drive yourself to the hospital. Summary Peripheral edema is swelling that is caused by a buildup of fluid. Peripheral edema most often affects the lower legs, ankles, and feet. Move around often to prevent stiffness and to reduce swelling. Do not sit or stand for long periods of time. Pay attention to any changes in your symptoms. Contact a health care provider if you have edema that starts suddenly or is getting worse, especially if you are pregnant or have a medical condition. Get help right away if you develop shortness of breath, especially when lying down.   This information is not intended to replace advice given to you by your health care provider. Make sure you discuss any questions you have with your health care provider. Document Revised: 10/28/2020 Document Reviewed: 10/28/2020 Elsevier Patient Education  2023 [employer]  

## 2021-07-31 NOTE — Progress Notes (Signed)
   Subjective:    Patient ID: Thomas Medici., male    DOB: 1956/02/21, 66 y.o.   MRN: [id number]   Chief Complaint: feet and legs swelling   HPI Patient come in today c/o bil lower ext edema. Started several days ago. He has been sitting a lot.     Review of Systems  Constitutional:  Negative for diaphoresis.  Eyes:  Negative for pain.  Respiratory:  Negative for shortness of breath.   Cardiovascular:  Negative for chest pain, palpitations and leg swelling.  Gastrointestinal:  Negative for abdominal pain.  Endocrine: Negative for polydipsia.  Skin:  Negative for rash.  Neurological:  Negative for dizziness, weakness and headaches.  Hematological:  Does not bruise/bleed easily.  All other systems reviewed and are negative.     Objective:   Physical Exam Vitals reviewed.  Constitutional:      Appearance: Normal appearance.  Cardiovascular:     Rate and Rhythm: Normal rate and regular rhythm.     Heart sounds: Normal heart sounds.  Pulmonary:     Effort: Pulmonary effort is normal.     Breath sounds: Normal breath sounds.  Musculoskeletal:     Right lower leg: Edema (1+) present.     Left lower leg: Edema (1+) present.  Skin:    General: Skin is warm and dry.  Neurological:     General: No focal deficit present.     Mental Status: He is alert and oriented to person, place, and time.  Psychiatric:        Mood and Affect: Mood normal.    BP 101/61   Pulse 87   Temp (!) 97.3 F (36.3 C) (Temporal)   Resp 20   Ht '5\' 10"'$  (1.778 m)   Wt 175 lb (79.4 kg)   SpO2 91%   BMI 25.11 kg/m        Assessment & Plan:   Thomas Medici. in today with chief complaint of feet and legs swelling   1. Mild peripheral edema Elevate legs when sitting Keep scheduled follow up appoitment - furosemide (LASIX) 40 MG tablet; Take 1 tablet (40 mg total) by mouth daily.  Dispense: 90 tablet; Refill: 1    The above assessment and management plan was discussed with  the patient. The patient verbalized understanding of and has agreed to the management plan. Patient is aware to call the clinic if symptoms persist or worsen. Patient is aware when to return to the clinic for a follow-up visit. Patient educated on when it is appropriate to go to the emergency department.   [name] Hassell Done, FNP

## 2021-08-16 ENCOUNTER — Encounter (HOSPITAL_COMMUNITY): Payer: Self-pay

## 2021-08-16 ENCOUNTER — Inpatient Hospital Stay (HOSPITAL_COMMUNITY)
Admission: EM | Admit: 2021-08-16 | Discharge: 2021-08-18 | DRG: 871 | Disposition: A | Discharge status: Home / Self Care | Payer: BC Managed Care – PPO | Attending: Internal Medicine | Admitting: Internal Medicine

## 2021-08-16 ENCOUNTER — Emergency Department (HOSPITAL_COMMUNITY): Payer: BC Managed Care – PPO

## 2021-08-16 ENCOUNTER — Other Ambulatory Visit: Payer: Self-pay

## 2021-08-16 DIAGNOSIS — I248 Other forms of acute ischemic heart disease: Secondary | ICD-10-CM | POA: Diagnosis not present

## 2021-08-16 DIAGNOSIS — E1151 Type 2 diabetes mellitus with diabetic peripheral angiopathy without gangrene: Secondary | ICD-10-CM | POA: Diagnosis not present

## 2021-08-16 DIAGNOSIS — J9601 Acute respiratory failure with hypoxia: Secondary | ICD-10-CM | POA: Diagnosis not present

## 2021-08-16 DIAGNOSIS — F17210 Nicotine dependence, cigarettes, uncomplicated: Secondary | ICD-10-CM | POA: Diagnosis present

## 2021-08-16 DIAGNOSIS — R0600 Dyspnea, unspecified: Secondary | ICD-10-CM | POA: Diagnosis not present

## 2021-08-16 DIAGNOSIS — I4819 Other persistent atrial fibrillation: Secondary | ICD-10-CM | POA: Diagnosis present

## 2021-08-16 DIAGNOSIS — Z8249 Family history of ischemic heart disease and other diseases of the circulatory system: Secondary | ICD-10-CM

## 2021-08-16 DIAGNOSIS — I341 Nonrheumatic mitral (valve) prolapse: Secondary | ICD-10-CM | POA: Diagnosis not present

## 2021-08-16 DIAGNOSIS — I4891 Unspecified atrial fibrillation: Secondary | ICD-10-CM | POA: Diagnosis not present

## 2021-08-16 DIAGNOSIS — J449 Chronic obstructive pulmonary disease, unspecified: Secondary | ICD-10-CM | POA: Diagnosis present

## 2021-08-16 DIAGNOSIS — J189 Pneumonia, unspecified organism: Secondary | ICD-10-CM | POA: Diagnosis present

## 2021-08-16 DIAGNOSIS — I10 Essential (primary) hypertension: Secondary | ICD-10-CM | POA: Diagnosis present

## 2021-08-16 DIAGNOSIS — J4489 Other specified chronic obstructive pulmonary disease: Secondary | ICD-10-CM | POA: Diagnosis present

## 2021-08-16 DIAGNOSIS — E1165 Type 2 diabetes mellitus with hyperglycemia: Secondary | ICD-10-CM | POA: Diagnosis not present

## 2021-08-16 DIAGNOSIS — Z794 Long term (current) use of insulin: Secondary | ICD-10-CM | POA: Diagnosis not present

## 2021-08-16 DIAGNOSIS — A419 Sepsis, unspecified organism: Principal | ICD-10-CM | POA: Diagnosis present

## 2021-08-16 DIAGNOSIS — Z8701 Personal history of pneumonia (recurrent): Secondary | ICD-10-CM

## 2021-08-16 DIAGNOSIS — I152 Hypertension secondary to endocrine disorders: Secondary | ICD-10-CM | POA: Diagnosis not present

## 2021-08-16 DIAGNOSIS — K219 Gastro-esophageal reflux disease without esophagitis: Secondary | ICD-10-CM | POA: Diagnosis present

## 2021-08-16 DIAGNOSIS — Z882 Allergy status to sulfonamides status: Secondary | ICD-10-CM | POA: Diagnosis not present

## 2021-08-16 DIAGNOSIS — I5042 Chronic combined systolic (congestive) and diastolic (congestive) heart failure: Secondary | ICD-10-CM | POA: Diagnosis present

## 2021-08-16 DIAGNOSIS — Z79899 Other long term (current) drug therapy: Secondary | ICD-10-CM

## 2021-08-16 DIAGNOSIS — I4892 Unspecified atrial flutter: Secondary | ICD-10-CM | POA: Diagnosis not present

## 2021-08-16 DIAGNOSIS — J209 Acute bronchitis, unspecified: Secondary | ICD-10-CM | POA: Diagnosis present

## 2021-08-16 DIAGNOSIS — Z833 Family history of diabetes mellitus: Secondary | ICD-10-CM

## 2021-08-16 DIAGNOSIS — R911 Solitary pulmonary nodule: Secondary | ICD-10-CM | POA: Diagnosis present

## 2021-08-16 DIAGNOSIS — R0689 Other abnormalities of breathing: Secondary | ICD-10-CM | POA: Diagnosis not present

## 2021-08-16 DIAGNOSIS — J439 Emphysema, unspecified: Secondary | ICD-10-CM | POA: Diagnosis not present

## 2021-08-16 DIAGNOSIS — H9190 Unspecified hearing loss, unspecified ear: Secondary | ICD-10-CM | POA: Diagnosis not present

## 2021-08-16 DIAGNOSIS — E162 Hypoglycemia, unspecified: Secondary | ICD-10-CM | POA: Diagnosis not present

## 2021-08-16 DIAGNOSIS — E119 Type 2 diabetes mellitus without complications: Secondary | ICD-10-CM | POA: Diagnosis not present

## 2021-08-16 DIAGNOSIS — R6520 Severe sepsis without septic shock: Secondary | ICD-10-CM | POA: Diagnosis present

## 2021-08-16 DIAGNOSIS — N179 Acute kidney failure, unspecified: Secondary | ICD-10-CM | POA: Diagnosis present

## 2021-08-16 DIAGNOSIS — G8929 Other chronic pain: Secondary | ICD-10-CM | POA: Diagnosis present

## 2021-08-16 DIAGNOSIS — Z8551 Personal history of malignant neoplasm of bladder: Secondary | ICD-10-CM | POA: Diagnosis not present

## 2021-08-16 DIAGNOSIS — E11319 Type 2 diabetes mellitus with unspecified diabetic retinopathy without macular edema: Secondary | ICD-10-CM | POA: Diagnosis not present

## 2021-08-16 DIAGNOSIS — R918 Other nonspecific abnormal finding of lung field: Secondary | ICD-10-CM | POA: Diagnosis not present

## 2021-08-16 DIAGNOSIS — Z87891 Personal history of nicotine dependence: Secondary | ICD-10-CM

## 2021-08-16 DIAGNOSIS — Z881 Allergy status to other antibiotic agents status: Secondary | ICD-10-CM

## 2021-08-16 DIAGNOSIS — R0902 Hypoxemia: Secondary | ICD-10-CM | POA: Diagnosis not present

## 2021-08-16 DIAGNOSIS — E11649 Type 2 diabetes mellitus with hypoglycemia without coma: Secondary | ICD-10-CM | POA: Diagnosis not present

## 2021-08-16 DIAGNOSIS — J181 Lobar pneumonia, unspecified organism: Secondary | ICD-10-CM | POA: Diagnosis not present

## 2021-08-16 DIAGNOSIS — Z7901 Long term (current) use of anticoagulants: Secondary | ICD-10-CM

## 2021-08-16 DIAGNOSIS — R404 Transient alteration of awareness: Secondary | ICD-10-CM | POA: Diagnosis not present

## 2021-08-16 DIAGNOSIS — M545 Low back pain, unspecified: Secondary | ICD-10-CM | POA: Diagnosis present

## 2021-08-16 DIAGNOSIS — Z7951 Long term (current) use of inhaled steroids: Secondary | ICD-10-CM

## 2021-08-16 DIAGNOSIS — R4020 Unspecified coma: Secondary | ICD-10-CM | POA: Diagnosis not present

## 2021-08-16 LAB — I-STAT VENOUS BLOOD GAS, ED
Acid-Base Excess: 3 mmol/L — ABNORMAL HIGH (ref 0.0–2.0)
Bicarbonate: 27 mmol/L (ref 20.0–28.0)
Calcium, Ion: 1.19 mmol/L (ref 1.15–1.40)
HCT: 42 % (ref 39.0–52.0)
Hemoglobin: 14.3 g/dL (ref 13.0–17.0)
O2 Saturation: 96 %
Potassium: 4.3 mmol/L (ref 3.5–5.1)
Sodium: 131 mmol/L — ABNORMAL LOW (ref 135–145)
TCO2: 28 mmol/L (ref 22–32)
pCO2, Ven: 37.2 mmHg — ABNORMAL LOW (ref 44–60)
pH, Ven: 7.469 — ABNORMAL HIGH (ref 7.25–7.43)
pO2, Ven: 73 mmHg — ABNORMAL HIGH (ref 32–45)

## 2021-08-16 LAB — COMPREHENSIVE METABOLIC PANEL
ALT: 9 U/L (ref 0–44)
AST: 20 U/L (ref 15–41)
Albumin: 3.4 g/dL — ABNORMAL LOW (ref 3.5–5.0)
Alkaline Phosphatase: 71 U/L (ref 38–126)
Anion gap: 13 (ref 5–15)
BUN: 14 mg/dL (ref 8–23)
CO2: 25 mmol/L (ref 22–32)
Calcium: 10.4 mg/dL — ABNORMAL HIGH (ref 8.9–10.3)
Chloride: 95 mmol/L — ABNORMAL LOW (ref 98–111)
Creatinine, Ser: 1.44 mg/dL — ABNORMAL HIGH (ref 0.61–1.24)
GFR, Estimated: 55 mL/min — ABNORMAL LOW (ref >60)
Glucose, Bld: 60 mg/dL — ABNORMAL LOW (ref 70–99)
Potassium: 3.7 mmol/L (ref 3.5–5.1)
Sodium: 133 mmol/L — ABNORMAL LOW (ref 135–145)
Total Bilirubin: 0.9 mg/dL (ref 0.3–1.2)
Total Protein: 7.3 g/dL (ref 6.5–8.1)

## 2021-08-16 LAB — CBC WITH DIFFERENTIAL/PLATELET
Abs Immature Granulocytes: 0.07 10*3/uL (ref 0.00–0.07)
Basophils Absolute: 0.1 10*3/uL (ref 0.0–0.1)
Basophils Relative: 0 %
Eosinophils Absolute: 0 10*3/uL (ref 0.0–0.5)
Eosinophils Relative: 0 %
HCT: 41.7 % (ref 39.0–52.0)
Hemoglobin: 13.7 g/dL (ref 13.0–17.0)
Immature Granulocytes: 1 %
Lymphocytes Relative: 7 %
Lymphs Abs: 0.9 10*3/uL (ref 0.7–4.0)
MCH: 34.9 pg — ABNORMAL HIGH (ref 26.0–34.0)
MCHC: 32.9 g/dL (ref 30.0–36.0)
MCV: 106.1 fL — ABNORMAL HIGH (ref 80.0–100.0)
Monocytes Absolute: 1.1 10*3/uL — ABNORMAL HIGH (ref 0.1–1.0)
Monocytes Relative: 9 %
Neutro Abs: 10.6 10*3/uL — ABNORMAL HIGH (ref 1.7–7.7)
Neutrophils Relative %: 83 %
Platelets: 343 10*3/uL (ref 150–400)
RBC: 3.93 MIL/uL — ABNORMAL LOW (ref 4.22–5.81)
RDW: 13.6 % (ref 11.5–15.5)
WBC: 12.8 10*3/uL — ABNORMAL HIGH (ref 4.0–10.5)
nRBC: 0 % (ref 0.0–0.2)

## 2021-08-16 LAB — CBG MONITORING, ED
Glucose-Capillary: 61 mg/dL — ABNORMAL LOW (ref 70–99)
Glucose-Capillary: 208 mg/dL — ABNORMAL HIGH (ref 70–99)
Glucose-Capillary: 236 mg/dL — ABNORMAL HIGH (ref 70–99)

## 2021-08-16 LAB — PROTIME-INR
INR: 1.4 — ABNORMAL HIGH (ref 0.8–1.2)
Prothrombin Time: 17.3 seconds — ABNORMAL HIGH (ref 11.4–15.2)

## 2021-08-16 LAB — LACTIC ACID, PLASMA
Lactic Acid, Venous: 2.2 mmol/L (ref 0.5–1.9)
Lactic Acid, Venous: 1.7 mmol/L (ref 0.5–1.9)

## 2021-08-16 MED ORDER — DEXTROSE-NACL 10-0.45 % IV SOLN
INTRAVENOUS | Status: DC
  Filled 2021-08-16 (×3): qty 1000

## 2021-08-16 MED ORDER — SODIUM CHLORIDE 0.9 % IV SOLN
100.0000 mg | Freq: Two times a day (BID) | INTRAVENOUS | Status: DC
  Administered 2021-08-16 – 2021-08-18 (×4): 100 mg via INTRAVENOUS
  Filled 2021-08-16 (×5): qty 100

## 2021-08-16 MED ORDER — IPRATROPIUM-ALBUTEROL 0.5-2.5 (3) MG/3ML IN SOLN
3.0000 mL | Freq: Once | RESPIRATORY_TRACT | Status: AC
  Administered 2021-08-16: 3 mL via RESPIRATORY_TRACT
  Filled 2021-08-16: qty 3

## 2021-08-16 MED ORDER — PIPERACILLIN-TAZOBACTAM 3.375 G IVPB
3.3750 g | Freq: Three times a day (TID) | INTRAVENOUS | Status: DC
  Administered 2021-08-17 – 2021-08-18 (×4): 3.375 g via INTRAVENOUS
  Filled 2021-08-16 (×6): qty 50

## 2021-08-16 MED ORDER — METOPROLOL SUCCINATE ER 100 MG PO TB24
100.0000 mg | ORAL_TABLET | Freq: Every day | ORAL | Status: DC
  Administered 2021-08-16 – 2021-08-17 (×2): 100 mg via ORAL
  Filled 2021-08-16: qty 4
  Filled 2021-08-16: qty 1

## 2021-08-16 MED ORDER — VANCOMYCIN HCL 1250 MG/250ML IV SOLN
1250.0000 mg | INTRAVENOUS | Status: DC
  Administered 2021-08-17: 1250 mg via INTRAVENOUS
  Filled 2021-08-16 (×2): qty 250

## 2021-08-16 MED ORDER — METHYLPREDNISOLONE SODIUM SUCC 125 MG IJ SOLR
125.0000 mg | Freq: Once | INTRAMUSCULAR | Status: AC
  Administered 2021-08-16: 125 mg via INTRAVENOUS
  Filled 2021-08-16: qty 2

## 2021-08-16 MED ORDER — UMECLIDINIUM BROMIDE 62.5 MCG/ACT IN AEPB
1.0000 | INHALATION_SPRAY | Freq: Every day | RESPIRATORY_TRACT | Status: DC
  Administered 2021-08-17 – 2021-08-18 (×2): 1 via RESPIRATORY_TRACT
  Filled 2021-08-16: qty 7

## 2021-08-16 MED ORDER — FLUTICASONE FUROATE-VILANTEROL 100-25 MCG/ACT IN AEPB
1.0000 | INHALATION_SPRAY | Freq: Every day | RESPIRATORY_TRACT | Status: DC
  Administered 2021-08-17 – 2021-08-18 (×2): 1 via RESPIRATORY_TRACT
  Filled 2021-08-16: qty 28

## 2021-08-16 MED ORDER — ACETAMINOPHEN 650 MG RE SUPP: 650.0000 mg | Freq: Four times a day (QID) | RECTAL | Status: DC | PRN

## 2021-08-16 MED ORDER — RIVAROXABAN 20 MG PO TABS
20.0000 mg | ORAL_TABLET | Freq: Every day | ORAL | Status: DC
  Administered 2021-08-16 – 2021-08-17 (×2): 20 mg via ORAL
  Filled 2021-08-16: qty 1
  Filled 2021-08-16: qty 2

## 2021-08-16 MED ORDER — VANCOMYCIN HCL 1500 MG/300ML IV SOLN
1500.0000 mg | Freq: Once | INTRAVENOUS | Status: AC
  Administered 2021-08-16: 1500 mg via INTRAVENOUS
  Filled 2021-08-16 (×2): qty 300

## 2021-08-16 MED ORDER — HYDROCODONE-ACETAMINOPHEN 5-325 MG PO TABS
1.0000 | ORAL_TABLET | Freq: Four times a day (QID) | ORAL | Status: DC | PRN
  Administered 2021-08-17 – 2021-08-18 (×3): 1 via ORAL
  Filled 2021-08-16 (×4): qty 1

## 2021-08-16 MED ORDER — PIPERACILLIN-TAZOBACTAM 3.375 G IVPB 30 MIN
3.3750 g | Freq: Once | INTRAVENOUS | Status: AC
  Administered 2021-08-16: 3.375 g via INTRAVENOUS
  Filled 2021-08-16: qty 50

## 2021-08-16 MED ORDER — ACETAMINOPHEN 325 MG PO TABS: 650.0000 mg | ORAL_TABLET | Freq: Four times a day (QID) | ORAL | Status: DC | PRN

## 2021-08-16 MED ORDER — FAMOTIDINE 20 MG PO TABS
20.0000 mg | ORAL_TABLET | Freq: Every morning | ORAL | Status: DC
  Administered 2021-08-17 – 2021-08-18 (×2): 20 mg via ORAL
  Filled 2021-08-16 (×2): qty 1

## 2021-08-16 MED ORDER — DEXTROSE 50 % IV SOLN
50.0000 mL | Freq: Once | INTRAVENOUS | Status: AC
  Administered 2021-08-16: 50 mL via INTRAVENOUS
  Filled 2021-08-16: qty 50

## 2021-08-16 NOTE — Progress Notes (Signed)
Elink following for sepsis protocol. 

## 2021-08-16 NOTE — ED Provider Notes (Cosign Needed Addendum)
Thomas Sandoval   CSN: [id number] Arrival date & time: 08/16/21  1710     History  Chief Complaint  Patient presents with   Shortness of Breath   Hypoglycemia    Thomas Colpitts. is a 66 y.o. male with history of GERD, type 1 diabetes, paroxysmal atrial fibrillation, PAD, CHF, mitral valve prolapse, chronic smoker who presents the emergency department after near syncopal episode and hypoglycemia.  Wife at bedside states that patient woke up this morning, and was not feeling very hungry.  He took his normal dose of Lantus and NovoLog, but wife believes he did not eat adequately enough.  She noted that he did not seem like himself, and felt very dizzy.  She had checked his blood sugar and it was reading in the 30s to 40s.  She tried to get him something to eat as well as drink, but he seemed too confused to do so.  She got worried and called 911.  In route the paramedics noted that the patient seemed to be in some respiratory distress, and had "rattling in his lungs".  They placed him on a nonrebreather.  Patient was recently admitted to the hospital about a month ago for pneumonia, and wife believes that this has come back.  When he was discharged from the hospital he was placed on 3 L nasal cannula for home.    Shortness of Breath Associated symptoms: cough, diaphoresis and wheezing   Hypoglycemia Associated symptoms: dizziness, shortness of breath and sweats        Home Medications Prior to Admission medications   Medication Sig Start Date End Date Taking? Authorizing Provider  acetaminophen (TYLENOL) 500 MG tablet Take 500 mg by mouth every 6 (six) hours as needed for moderate pain or headache.   Yes [provider]  cetirizine (ZYRTEC) 10 MG tablet Take 10 mg by mouth daily.   Yes [provider]  empagliflozin (JARDIANCE) 10 MG TABS tablet Take 1 tablet (10 mg total) by mouth daily before breakfast. 04/01/21   Yes Donato Heinz, MD  famotidine (PEPCID) 20 MG tablet Take 1 tablet (20 mg total) by mouth every morning. Reported on 04/16/2015 04/28/21  Yes Hassell Done, [name], FNP  Fluticasone-Umeclidin-Vilant (TRELEGY ELLIPTA) 100-62.5-25 MCG/ACT AEPB INHALE 1 PUFF INTO THE LUNGS TWICE A DAY Patient taking differently: Inhale 1 puff into the lungs daily. INHALE 1 PUFF INTO THE LUNGS TWICE A DAY 04/28/21  Yes Hassell Done, [name], FNP  furosemide (LASIX) 40 MG tablet Take 1 tablet (40 mg total) by mouth daily. 07/31/21  Yes Martin, [name], FNP  HYDROcodone-acetaminophen (NORCO) 5-325 MG tablet Take 1 tablet by mouth 2 (two) times daily. 07/28/21 08/27/21 Yes Martin, [name], FNP  LANTUS 100 UNIT/ML injection INJECT 0.4 MLS (40 UNITS TOTAL) INTO THE SKIN IN THE MORNING. Patient taking differently: Inject 40 Units into the skin daily. 06/04/21  Yes Martin, [name], FNP  losartan (COZAAR) 50 MG tablet Take 1 tablet (50 mg total) by mouth at bedtime. hold this medication until follow up 07/19/21  Yes Patrecia Pour, MD  metoprolol succinate (TOPROL-XL) 100 MG 24 hr tablet Take 1 tablet (100 mg total) by mouth daily. Take with or immediately following a meal. Patient taking differently: Take 100 mg by mouth at bedtime. Take with or immediately following a meal. 07/19/21  Yes Patrecia Pour, MD  NOVOLOG 100 UNIT/ML injection PER SLIDING SCALE: 190 - 200 = 2 UNITS 300 AND ABOVE = 7 UNITS  Patient taking differently: Inject 2-7 Units into the skin See admin instructions. Per sliding scale 3 times daily 02/14/21  Yes Hassell Done, [name], FNP  OVER THE COUNTER MEDICATION Take 1 Scoop by mouth in the morning and at bedtime. Super Beets   Yes [provider]  rivaroxaban (XARELTO) 20 MG TABS tablet Take 1 tablet (20 mg total) by mouth daily with supper. Patient taking differently: Take 20 mg by mouth at bedtime. 04/28/21  Yes Hassell Done, [name], FNP  HYDROcodone-acetaminophen (NORCO) 5-325  MG tablet Take 1 tablet by mouth 2 (two) times daily. Patient not taking: Reported on 08/16/2021 09/26/21 10/26/21  Chevis Pretty, FNP  HYDROcodone-acetaminophen (NORCO) 5-325 MG tablet Take 1 tablet by mouth 2 (two) times daily. Patient not taking: Reported on 08/16/2021 08/27/21 09/26/21  Chevis Pretty, FNP  Insulin Syringe-Needle U-100 (B-D INS SYR ULTRAFINE 1CC/30G) 30G X 1/2" 1 ML MISC Use 4 times a day with insulin Dx E11.9 08/22/20   Chevis Pretty, FNP  Insulin Syringes, Disposable, U-100 1 ML MISC Use 4 times a day for insulin injection Dx E11.9 12/27/18   Chevis Pretty, FNP      Allergies    Azithromycin, Avelox [moxifloxacin hcl in nacl], and Bactrim [sulfamethoxazole-trimethoprim]    Review of Systems   Review of Systems  Constitutional:  Positive for diaphoresis.  Respiratory:  Positive for cough, shortness of breath and wheezing.   Neurological:  Positive for dizziness.  All other systems reviewed and are negative.  Physical Exam Updated Vital Signs BP (!) 161/99   Pulse (!) 130   Temp 98.9 F (37.2 C) (Rectal)   Resp (!) 24   SpO2 96%  Physical Exam Vitals and nursing Sandoval reviewed.  Constitutional:      Appearance: Normal appearance. He is ill-appearing and diaphoretic.  HENT:     Head: Normocephalic and atraumatic.  Eyes:     Conjunctiva/sclera: Conjunctivae normal.  Cardiovascular:     Rate and Rhythm: Tachycardia present. Rhythm irregularly irregular.  Pulmonary:     Effort: Pulmonary effort is normal. No respiratory distress.     Breath sounds: Wheezing present.  Abdominal:     General: There is no distension.     Palpations: Abdomen is soft.     Tenderness: There is no abdominal tenderness.  Skin:    General: Skin is warm.  Neurological:     General: No focal deficit present.     Mental Status: He is alert.    ED Results / Procedures / Treatments   Labs (all labs ordered are listed, but only abnormal results are  displayed) Labs Reviewed  COMPREHENSIVE METABOLIC PANEL - Abnormal; Notable for the following components:      Result Value   Sodium 133 (*)    Chloride 95 (*)    Glucose, Bld 60 (*)    Creatinine, Ser 1.44 (*)    Calcium 10.4 (*)    Albumin 3.4 (*)    GFR, Estimated 55 (*)    All other components within normal limits  LACTIC ACID, PLASMA - Abnormal; Notable for the following components:   Lactic Acid, Venous 2.2 (*)    All other components within normal limits  CBC WITH DIFFERENTIAL/PLATELET - Abnormal; Notable for the following components:   WBC 12.8 (*)    RBC 3.93 (*)    MCV 106.1 (*)    MCH 34.9 (*)    Neutro Abs 10.6 (*)    Monocytes Absolute 1.1 (*)    All other components within normal limits  PROTIME-INR - Abnormal; Notable for the following components:   Prothrombin Time 17.3 (*)    INR 1.4 (*)    All other components within normal limits  CBG MONITORING, ED - Abnormal; Notable for the following components:   Glucose-Capillary 61 (*)    All other components within normal limits  CBG MONITORING, ED - Abnormal; Notable for the following components:   Glucose-Capillary 208 (*)    All other components within normal limits  CULTURE, BLOOD (ROUTINE X 2)  CULTURE, BLOOD (ROUTINE X 2)  MRSA NEXT GEN BY PCR, NASAL  LACTIC ACID, PLASMA  URINALYSIS, ROUTINE W REFLEX MICROSCOPIC  I-STAT VENOUS BLOOD GAS, ED    EKG None  Radiology DG Chest Portable 1 View  Result Date: 08/16/2021 CLINICAL DATA:  Dyspnea EXAM: PORTABLE CHEST 1 VIEW COMPARISON:  07/26/2021 FINDINGS: Right basilar consolidation appears relatively stable since prior examination suggesting recurrent infection or aspiration or an incompletely resolved infectious process. No pneumothorax or pleural effusion. Cardiac size within normal limits. Pulmonary vascularity is normal. No acute bone abnormality. IMPRESSION: Persistent consolidation within the right lung base, relatively stable since prior examination  suggesting either incomplete resolution or recurrent infection or aspiration. Nonemergent contrast enhanced CT examination may be helpful to exclude the presence of a central obstructing lesion. Electronically Signed   By: Fidela Salisbury M.D.   On: 08/16/2021 18:44    Procedures .Critical Care  Performed by: Kateri Plummer, PA-C Authorized by: Kateri Plummer, PA-C   Critical care provider statement:    Critical care time (minutes):  30   Critical care was necessary to treat or prevent imminent or life-threatening deterioration of the following conditions:  Respiratory failure, cardiac failure, sepsis, shock and metabolic crisis   Critical care was time spent personally by me on the following activities:  Development of treatment plan with patient or surrogate, discussions with consultants, evaluation of patient's response to treatment, examination of patient, ordering and review of laboratory studies, ordering and review of radiographic studies, ordering and performing treatments and interventions, pulse oximetry, re-evaluation of patient's condition and review of old charts     Medications Ordered in ED Medications  dextrose 10 % and 0.45 % NaCl infusion ( Intravenous New Bag/Given 08/16/21 1949)  vancomycin (VANCOREADY) IVPB 1500 mg/300 mL (has no administration in time range)  piperacillin-tazobactam (ZOSYN) IVPB 3.375 g (has no administration in time range)  vancomycin (VANCOREADY) IVPB 1250 mg/250 mL (has no administration in time range)  dextrose 50 % solution 50 mL (50 mLs Intravenous Given 08/16/21 1748)  ipratropium-albuterol (DUONEB) 0.5-2.5 (3) MG/3ML nebulizer solution 3 mL (3 mLs Nebulization Given 08/16/21 1753)  methylPREDNISolone sodium succinate (SOLU-MEDROL) 125 mg/2 mL injection 125 mg (125 mg Intravenous Given 08/16/21 1751)  piperacillin-tazobactam (ZOSYN) IVPB 3.375 g (0 g Intravenous Stopped 08/16/21 1948)    ED Course/ Medical Decision Making/ A&P                            Medical Decision Making Amount and/or Complexity of Data Reviewed Labs: ordered. Radiology: ordered.  Risk Prescription drug management. Decision regarding hospitalization.  This patient is a 66 y.o. male who presents to the ED for concern of hypoglycemia and wheezing, this involves an extensive number of treatment options, and is a complaint that carries with it a high risk of complications and morbidity.   Past Medical History / Co-morbidities / Social History: GERD, type 1 diabetes, paroxysmal atrial fibrillation on Xarelto, PAD,  CHF, mitral valve prolapse, chronic smoker  Additional history: Chart reviewed. Pertinent results include: Was discharged from the hospital on 5/13 after persistent atrial fibrillation/flutter with RVR aspiration pneumonia of right lung.  He was treated with antibiotics, and cardiology was assisting with a rate control strategy, and although the patient remained hypoxic with ambulation, he left the hospital against medical advice.  He was discharged on 3 L nasal cannula, with antibiotics and increased dose of metoprolol.  Physical Exam: Physical exam performed. The pertinent findings include: Patient is ill-appearing.  Afebrile, but consistently tachycardic.  Initially was requiring 15 L non-rebreather with diffuse expiratory wheezing in all lung fields.  Lab Tests: I ordered, and personally interpreted labs.  The pertinent results include: Leukocytosis of 12.8.  Creatinine 1.44, compared to 0.92 three weeks ago.  Lactic acid 2.2.  Initial CBG 61, repeat CBG after intervention 208.   Imaging Studies: I ordered imaging studies including chest x-ray. I independently visualized and interpreted imaging which showed persistent versus recurrent right lower lobe pneumonia. I agree with the radiologist interpretation.   Cardiac Monitoring:  The patient was maintained on a cardiac monitor.  My attending physician Dr. Maryan Rued viewed and interpreted the  cardiac monitored which showed an underlying rhythm of: atrial flutter. I agree with this interpretation.   Medications: I ordered medication including IV antibiotics, steroids, breathing treatments, dextrose bolus and infusion. Reevaluation of the patient after these medicines showed that the patient improved. I have reviewed the patients home medicines and have made adjustments as needed.  Consultations Obtained: I requested consultation with the hospitalist Dr. Hal Hope,  and discussed lab and imaging findings as well as pertinent plan - they recommend: Medical admission   Disposition: After consideration of the diagnostic results and the patients response to treatment, I feel that patient is requiring admission for sepsis due to pneumonia with new oxygen requirement.  I discussed this case with my attending physician Dr. Maryan Rued who cosigned this Sandoval including patient's presenting symptoms, physical exam, and planned diagnostics and interventions. Attending physician stated agreement with plan or made changes to plan which were implemented.     Final Clinical Impression(s) / ED Diagnoses Final diagnoses:  Sepsis due to pneumonia ([place])  Hypoglycemia    Rx / DC Orders ED Discharge Orders     None      Portions of this report may have been transcribed using voice recognition software. Every effort was made to ensure accuracy; however, inadvertent computerized transcription errors may be present.    Estill Cotta 08/16/21 2032    Blanchie Dessert, MD 08/18/21 2141

## 2021-08-16 NOTE — ED Notes (Signed)
Pt to xray

## 2021-08-16 NOTE — ED Notes (Addendum)
Pt taken off NRB and  placed on 12LNC humidified

## 2021-08-16 NOTE — H&P (Signed)
History and Physical    Thomas Sandoval. XVQ:[id number] DOB: August 26, 1955 DOA: 08/16/2021  PCP: Chevis Pretty, FNP  Patient coming from: Home.  Chief Complaint: Low blood sugar.  HPI: Thomas Sandoval. is a 66 y.o. male with history of diabetes mellitus type 2, chronic combined systolic and diastolic CHF last EF measured in March 2023 was 27 to 60% with grade 2 diastolic dysfunction, atrial fibrillation, hypertension, chronic pain history of tobacco or alcohol use was admitted last month for pneumonia left [place] presents to the ER with complaints of having low blood sugar was found to have blood sugar of around 30.  Patient used to take Lantus insulin 40 units in the morning along with NovoLog.  Over the last 1 week patient has been more short of breath than usual productive cough denies any chest pain.  Had come to the ER about 2 to 3 weeks ago with some burns around his mouth.  ED Course: In the ER patient was placed on D10 after patient was found to be hypoglycemic following which blood sugar improved.  Patient was found to be short of breath requiring initially nonrebreather chest x-ray shows persistent consolidation and was started on empiric antibiotics for pneumonia.  Patient was intermittently going into A-fib with RVR and sinus tachycardia.  Lactate is elevated.  Patient admitted for sepsis secondary to pneumonia.  Labs show elevated creatinine.  Review of Systems: As per HPI, rest all negative.   Past Medical History:  Diagnosis Date   Arthritis    Bladder cancer ([place])    Chronic back pain    Chronic combined systolic and diastolic CHF (congestive heart failure) ([place]) 07/16/2021   COPD (chronic obstructive pulmonary disease) (HCC)    DDD (degenerative disc disease)    Diabetic retinopathy of both eyes (HCC)    Essential hypertension    GERD (gastroesophageal reflux disease)    History of atrial flutter 02/2011   Converted to NSR with Cardizem    History of chronic bronchitis    History of hemolytic anemia 02/2011   secondary to Avelox   HOH (hard of hearing)    HOH (hard of hearing)    no eardrum and nerve damage on R, also HOH on L   Mitral valve prolapse    a. 2D Echo 11/27/14: EF 55-60%; images were inadequate for LV wall motion assessment, + mild late systolic mitral valve prolapse involving the anterior leaflet.   PAD (peripheral artery disease) ([place]) 04/2014   Dr Trula Slade; bilateral SFA occlusion, R mid, L distal   PAF (paroxysmal atrial fibrillation) ([place])    a. Dx 11/2014 during admission for perf ulcer.   Perforated ulcer ([place])    a. 11/2014 s/p surgery.   Productive cough    Smokers' cough ([place])    Type 1 diabetes mellitus ([place]) 1977    Past Surgical History:  Procedure Laterality Date   CARDIOVERSION N/A 01/20/2021   Procedure: CARDIOVERSION;  Surgeon: Fay Records, MD;  Location: [facility];  Service: Cardiovascular;  Laterality: N/A;   CYSTOSCOPY WITH URETEROSCOPY Right 08/14/2013   Procedure: CYSTOSCOPY WITH URETEROSCOPY BLADDER BIOPSY ;  Surgeon: Claybon Jabs, MD;  Location[facility];  Service: Urology;  Laterality: Right;   ESOPHAGOGASTRODUODENOSCOPY N/A 02/06/2013   Procedure: ESOPHAGOGASTRODUODENOSCOPY (EGD);  Surgeon: Lafayette Dragon, MD;  Location: [facility] ENDOSCOPY;  Service: Endoscopy;  Laterality: N/A;   LAPAROSCOPY N/A 11/25/2014   Procedure: LAPAROSCOPIC PRIMARY REPAIR OF PERFORATED PREPYLORIC ULCER WITH Phillip Heal  PATCH;  Surgeon: Greer Pickerel, MD;  Location: [facility];  Service: General;  Laterality: N/A;   TONSILLECTOMY  as child   TRANSTHORACIC ECHOCARDIOGRAM  02-17-2011   MODERATE LVH/  EF 65%   TRANSURETHRAL RESECTION OF BLADDER TUMOR WITH GYRUS (TURBT-GYRUS) N/A 06/12/2013   Procedure: TRANSURETHRAL RESECTION OF BLADDER TUMOR WITH GYRUS (TURBT-GYRUS);  Surgeon: Claybon Jabs, MD;  [facility];  Service: Urology;  Laterality: N/A;   TYMPANIC MEMBRANE REPAIR  as child      reports that he has been smoking cigarettes. He has a 60.00 pack-year smoking history. He quit smokeless tobacco use about 43 years ago. He reports that he does not currently use alcohol. He reports that he does not use drugs.  Allergies  Allergen Reactions   Azithromycin Other (See Comments) and Nausea And Vomiting    "Severe stomach cramps; told to list as an allergy by dr. Huel Cote ago"   Avelox [Moxifloxacin Hcl In Nacl] Other (See Comments)    Hemolysis  In 2012   Bactrim [Sulfamethoxazole-Trimethoprim] Diarrhea and Nausea And Vomiting    Family History  Problem Relation Age of Onset   Breast cancer Mother    Cancer Mother        Breast   Rheumatic fever Father    Heart disease Father    Heart attack Father        Massive    Diabetes Son     Prior to Admission medications   Medication Sig Start Date End Date Taking? Authorizing Provider  acetaminophen (TYLENOL) 500 MG tablet Take 500 mg by mouth every 6 (six) hours as needed for moderate pain or headache.   Yes [provider]  cetirizine (ZYRTEC) 10 MG tablet Take 10 mg by mouth daily.   Yes [provider]  empagliflozin (JARDIANCE) 10 MG TABS tablet Take 1 tablet (10 mg total) by mouth daily before breakfast. 04/01/21  Yes Donato Heinz, MD  famotidine (PEPCID) 20 MG tablet Take 1 tablet (20 mg total) by mouth every morning. Reported on 04/16/2015 04/28/21  Yes Hassell Done, [name], FNP  Fluticasone-Umeclidin-Vilant (TRELEGY ELLIPTA) 100-62.5-25 MCG/ACT AEPB INHALE 1 PUFF INTO THE LUNGS TWICE A DAY Patient taking differently: Inhale 1 puff into the lungs daily. INHALE 1 PUFF INTO THE LUNGS TWICE A DAY 04/28/21  Yes Hassell Done, [name], FNP  furosemide (LASIX) 40 MG tablet Take 1 tablet (40 mg total) by mouth daily. 07/31/21  Yes Martin, [name], FNP  HYDROcodone-acetaminophen (NORCO) 5-325 MG tablet Take 1 tablet by mouth 2 (two) times daily. 07/28/21 08/27/21 Yes Martin, [name], FNP   LANTUS 100 UNIT/ML injection INJECT 0.4 MLS (40 UNITS TOTAL) INTO THE SKIN IN THE MORNING. Patient taking differently: Inject 40 Units into the skin daily. 06/04/21  Yes Martin, [name], FNP  losartan (COZAAR) 50 MG tablet Take 1 tablet (50 mg total) by mouth at bedtime. hold this medication until follow up 07/19/21  Yes Patrecia Pour, MD  metoprolol succinate (TOPROL-XL) 100 MG 24 hr tablet Take 1 tablet (100 mg total) by mouth daily. Take with or immediately following a meal. Patient taking differently: Take 100 mg by mouth at bedtime. Take with or immediately following a meal. 07/19/21  Yes Patrecia Pour, MD  NOVOLOG 100 UNIT/ML injection PER SLIDING SCALE: 190 - 200 = 2 UNITS 300 AND ABOVE = 7 UNITS Patient taking differently: Inject 2-7 Units into the skin See admin instructions. Per sliding scale 3 times daily 02/14/21  Yes Chevis Pretty, FNP  OVER THE COUNTER MEDICATION Take 1 Scoop by mouth in the morning and at bedtime. Super Beets   Yes [provider]  rivaroxaban (XARELTO) 20 MG TABS tablet Take 1 tablet (20 mg total) by mouth daily with supper. Patient taking differently: Take 20 mg by mouth at bedtime. 04/28/21  Yes Hassell Done, [name], FNP  HYDROcodone-acetaminophen (NORCO) 5-325 MG tablet Take 1 tablet by mouth 2 (two) times daily. Patient not taking: Reported on 08/16/2021 09/26/21 10/26/21  Chevis Pretty, FNP  HYDROcodone-acetaminophen (NORCO) 5-325 MG tablet Take 1 tablet by mouth 2 (two) times daily. Patient not taking: Reported on 08/16/2021 08/27/21 09/26/21  Chevis Pretty, FNP  Insulin Syringe-Needle U-100 (B-D INS SYR ULTRAFINE 1CC/30G) 30G X 1/2" 1 ML MISC Use 4 times a day with insulin Dx E11.9 08/22/20   Chevis Pretty, FNP  Insulin Syringes, Disposable, U-100 1 ML MISC Use 4 times a day for insulin injection Dx E11.9 12/27/18   Chevis Pretty, FNP    Physical Exam: Constitutional: Moderately built and nourished. Vitals:    08/16/21 1900 08/16/21 1916 08/16/21 1917 08/16/21 1930  BP: (!) 151/86   (!) 161/99  Pulse: (!) 119   (!) 130  Resp: (!) 25 (!) 24  (!) 24  Temp:   98.9 F (37.2 C)   TempSrc:   Rectal   SpO2: 95% 96%  96%   Eyes: Anicteric no pallor. ENMT: No discharge from the ears eyes nose and mouth. Neck: No mass felt.  No neck rigidity. Respiratory: Mild expiratory wheeze and no crepitations. Cardiovascular: S1-S2 heard. Abdomen: Soft nontender bowel sound present. Musculoskeletal: No edema. Skin: No rash. Neurologic: Alert awake oriented time place and person.  Moves all extremities. Psychiatric: Appears normal.  Normal affect.   Labs on Admission: I have personally reviewed following labs and imaging studies  CBC: Recent Labs  Lab 08/16/21 1723 08/16/21 2037  WBC 12.8*  --   NEUTROABS 10.6*  --   HGB 13.7 14.3  HCT 41.7 42.0  MCV 106.1*  --   PLT 343  --    Basic Metabolic Panel: Recent Labs  Lab 08/16/21 1723 08/16/21 2037  NA 133* 131*  K 3.7 4.3  CL 95*  --   CO2 25  --   GLUCOSE 60*  --   BUN 14  --   CREATININE 1.44*  --   CALCIUM 10.4*  --    GFR: CrCl cannot be calculated (Unknown ideal weight.). Liver Function Tests: Recent Labs  Lab 08/16/21 1723  AST 20  ALT 9  ALKPHOS 71  BILITOT 0.9  PROT 7.3  ALBUMIN 3.4*   No results for input(s): "LIPASE", "AMYLASE" in the last 168 hours. No results for input(s): "AMMONIA" in the last 168 hours. Coagulation Profile: Recent Labs  Lab 08/16/21 1723  INR 1.4*   Cardiac Enzymes: No results for input(s): "CKTOTAL", "CKMB", "CKMBINDEX", "TROPONINI" in the last 168 hours. BNP (last 3 results) No results for input(s): "PROBNP" in the last 8760 hours. HbA1C: No results for input(s): "HGBA1C" in the last 72 hours. CBG: Recent Labs  Lab 08/16/21 1716 08/16/21 1910 08/16/21 2052  GLUCAP 61* 208* 236*   Lipid Profile: No results for input(s): "CHOL", "HDL", "LDLCALC", "TRIG", "CHOLHDL", "LDLDIRECT" in the  last 72 hours. Thyroid Function Tests: No results for input(s): "TSH", "T4TOTAL", "FREET4", "T3FREE", "THYROIDAB" in the last 72 hours. Anemia Panel: No results for input(s): "VITAMINB12", "FOLATE", "FERRITIN", "TIBC", "IRON", "RETICCTPCT" in the last 72 hours. Urine analysis:    Component Value  Date/Time   COLORURINE AMBER (A) 12/02/2020 1445   APPEARANCEUR HAZY (A) 12/02/2020 1445   LABSPEC 1.025 12/02/2020 1445   PHURINE 5.0 12/02/2020 1445   GLUCOSEU >=500 (A) 12/02/2020 1445   HGBUR SMALL (A) 12/02/2020 1445   BILIRUBINUR NEGATIVE 12/02/2020 1445   BILIRUBINUR neg 04/21/2013 1634   KETONESUR 80 (A) 12/02/2020 1445   PROTEINUR 100 (A) 12/02/2020 1445   UROBILINOGEN 1.0 11/25/2014 0348   NITRITE NEGATIVE 12/02/2020 1445   LEUKOCYTESUR NEGATIVE 12/02/2020 1445   Sepsis Labs: '@LABRCNTIP'$ (procalcitonin:4,lacticidven:4) )No results found for this or any previous visit (from the past 240 hour(s)).   Radiological Exams on Admission: DG Chest Portable 1 View  Result Date: 08/16/2021 CLINICAL DATA:  Dyspnea EXAM: PORTABLE CHEST 1 VIEW COMPARISON:  07/26/2021 FINDINGS: Right basilar consolidation appears relatively stable since prior examination suggesting recurrent infection or aspiration or an incompletely resolved infectious process. No pneumothorax or pleural effusion. Cardiac size within normal limits. Pulmonary vascularity is normal. No acute bone abnormality. IMPRESSION: Persistent consolidation within the right lung base, relatively stable since prior examination suggesting either incomplete resolution or recurrent infection or aspiration. Nonemergent contrast enhanced CT examination may be helpful to exclude the presence of a central obstructing lesion. Electronically Signed   By: Fidela Salisbury M.D.   On: 08/16/2021 18:44    EKG: Independently reviewed.  Atrial flutter RVR.  Assessment/Plan Principal Problem:   Sepsis ([place]) Active Problems:   Chronic combined systolic and  diastolic CHF (congestive heart failure) (HCC)   AKI (acute kidney injury) ([place])   COPD with chronic bronchitis and emphysema (HCC)   Hypertension associated with diabetes ([place])   Atrial fibrillation with RVR ([place])   CAP (community acquired pneumonia)    Sepsis secondary to pneumonia for which patient has been placed on empiric antibiotics.  Follow cultures.  Since patient has been a persistent pneumonia will check CT chest when patient is stable.  We will get swallow evaluation.  At admission patient was hypotensive and tachycardic with chest x-ray showing pneumonia chest consistent with sepsis. A-fib with RVR patient was going in and out of A-fib with RVR.  We will continue with patient's metoprolol 1 dose now along with Xarelto for anticoagulation.  Closely monitor heart rate.  Check TSH and monitor cardiac markers. History of chronic systolic and diastolic CHF last EF measured in March 2023 was 55 to 60% with grade 2 diastolic dysfunction.  Patient is receiving fluids at this time for hypoglycemia.  Closely monitor.  Holding Cozaar for now because patient was initially hypotensive.  Also has renal failure. Hypoglycemia with history of diabetes mellitus type 2 likely precipitated by patient not eating well.  Last hemoglobin A1c was 5.7 on Jul 17, 2021.  For now I am holding all insulin and checking CBGs every 2 hourly.  Patient is on D10.  Once blood sugar is stable we will discontinue D10 and restart insulin at a lower dose. Acute renal failure creatinine increased when compared to baseline.  Patient is receiving fluids at this time.  Follow metabolic panel.  Check UA.  Holding Cozaar for now. COPD we will continue home inhalers.  Did receive 1 dose of steroids in the ER. Chronic low back pain on Norco. History of tobacco and alcohol use closely monitor.  Since patient has sepsis with pneumonia and also hypoglycemic will need close monitoring for any further worsening inpatient status.   DVT  prophylaxis: Xarelto. Code Status: Full code. Family Communication: Patient's wife. Disposition Plan: Home when stable. Consults  called: Speech therapy. Admission status: Inpatient.   Rise Patience MD [facility] Pager [phone].  If 7PM-7AM, please contact night-coverage [email or web address] Password [facility]  08/16/2021, 10:05 PM

## 2021-08-16 NOTE — ED Triage Notes (Signed)
Pt bib [facility] from home where his family says he passed out today. On EMS arrival, pt found with 33 CBG and 73% 3LNC. Pt wearing 3LNC all the time. Pt given 1 amp d50 en route and placed on NRB 15L. Pt is poor historian.

## 2021-08-16 NOTE — Progress Notes (Signed)
Pharmacy Antibiotic Note  Thomas Sandoval. is a 66 y.o. male admitted on 08/16/2021 presenting with syncope, SOB, concern for pna.  Pharmacy has been consulted for zosyn and vancomycin dosing.  Plan: Vancomycin 1500 mg IV x 1, then 1250 mg IV q 24h (eAUC 468, SCr 1.44) Zosyn 3.375g IV q 8h (extended infusion) Add MRSA PCR Monitor renal function, Cx/PCR to narrow Vancomycin levels as needed     Temp (24hrs), Avg:99.9 F (37.7 C), Min:99.9 F (37.7 C), Max:99.9 F (37.7 C)  Recent Labs  Lab 08/16/21 1723  WBC 12.8*  CREATININE 1.44*  LATICACIDVEN 2.2*    CrCl cannot be calculated (Unknown ideal weight.).    Allergies  Allergen Reactions   Azithromycin Other (See Comments) and Nausea And Vomiting    "Severe stomach cramps; told to list as an allergy by dr. Huel Cote ago"   Avelox [Moxifloxacin Hcl In Nacl] Other (See Comments)    Hemolysis  In 2012   Bactrim [Sulfamethoxazole-Trimethoprim] Diarrhea and Nausea And Vomiting    Bertis Ruddy, PharmD Clinical Pharmacist ED Pharmacist Phone # [phone] 08/16/2021 7:02 PM

## 2021-08-17 ENCOUNTER — Inpatient Hospital Stay (HOSPITAL_COMMUNITY): Payer: BC Managed Care – PPO

## 2021-08-17 DIAGNOSIS — J189 Pneumonia, unspecified organism: Secondary | ICD-10-CM | POA: Diagnosis not present

## 2021-08-17 DIAGNOSIS — J9601 Acute respiratory failure with hypoxia: Secondary | ICD-10-CM

## 2021-08-17 DIAGNOSIS — N179 Acute kidney failure, unspecified: Secondary | ICD-10-CM

## 2021-08-17 DIAGNOSIS — E119 Type 2 diabetes mellitus without complications: Secondary | ICD-10-CM

## 2021-08-17 DIAGNOSIS — Z794 Long term (current) use of insulin: Secondary | ICD-10-CM

## 2021-08-17 DIAGNOSIS — I248 Other forms of acute ischemic heart disease: Secondary | ICD-10-CM

## 2021-08-17 LAB — CBG MONITORING, ED
Glucose-Capillary: 212 mg/dL — ABNORMAL HIGH (ref 70–99)
Glucose-Capillary: 273 mg/dL — ABNORMAL HIGH (ref 70–99)
Glucose-Capillary: 279 mg/dL — ABNORMAL HIGH (ref 70–99)
Glucose-Capillary: 292 mg/dL — ABNORMAL HIGH (ref 70–99)
Glucose-Capillary: 297 mg/dL — ABNORMAL HIGH (ref 70–99)

## 2021-08-17 LAB — CBC
HCT: 35.6 % — ABNORMAL LOW (ref 39.0–52.0)
Hemoglobin: 11.9 g/dL — ABNORMAL LOW (ref 13.0–17.0)
MCH: 35.1 pg — ABNORMAL HIGH (ref 26.0–34.0)
MCHC: 33.4 g/dL (ref 30.0–36.0)
MCV: 105 fL — ABNORMAL HIGH (ref 80.0–100.0)
Platelets: 252 10*3/uL (ref 150–400)
RBC: 3.39 MIL/uL — ABNORMAL LOW (ref 4.22–5.81)
RDW: 13.7 % (ref 11.5–15.5)
WBC: 16.9 10*3/uL — ABNORMAL HIGH (ref 4.0–10.5)
nRBC: 0 % (ref 0.0–0.2)

## 2021-08-17 LAB — COMPREHENSIVE METABOLIC PANEL
ALT: 11 U/L (ref 0–44)
AST: 20 U/L (ref 15–41)
Albumin: 2.8 g/dL — ABNORMAL LOW (ref 3.5–5.0)
Alkaline Phosphatase: 56 U/L (ref 38–126)
Anion gap: 12 (ref 5–15)
BUN: 15 mg/dL (ref 8–23)
CO2: 24 mmol/L (ref 22–32)
Calcium: 9.5 mg/dL (ref 8.9–10.3)
Chloride: 96 mmol/L — ABNORMAL LOW (ref 98–111)
Creatinine, Ser: 1.27 mg/dL — ABNORMAL HIGH (ref 0.61–1.24)
GFR, Estimated: >60 mL/min (ref >60)
Glucose, Bld: 306 mg/dL — ABNORMAL HIGH (ref 70–99)
Potassium: 4.7 mmol/L (ref 3.5–5.1)
Sodium: 132 mmol/L — ABNORMAL LOW (ref 135–145)
Total Bilirubin: 1.8 mg/dL — ABNORMAL HIGH (ref 0.3–1.2)
Total Protein: 6.2 g/dL — ABNORMAL LOW (ref 6.5–8.1)

## 2021-08-17 LAB — TSH: TSH: 0.322 u[IU]/mL — ABNORMAL LOW (ref 0.350–4.500)

## 2021-08-17 LAB — TROPONIN I (HIGH SENSITIVITY)
Troponin I (High Sensitivity): 240 ng/L (ref <18)
Troponin I (High Sensitivity): 160 ng/L (ref <18)
Troponin I (High Sensitivity): 148 ng/L (ref <18)

## 2021-08-17 LAB — T4, FREE: Free T4: 0.97 ng/dL (ref 0.61–1.12)

## 2021-08-17 LAB — FOLATE: Folate: 5.9 ng/mL — ABNORMAL LOW (ref >5.9)

## 2021-08-17 LAB — GLUCOSE, CAPILLARY
Glucose-Capillary: 298 mg/dL — ABNORMAL HIGH (ref 70–99)
Glucose-Capillary: 417 mg/dL — ABNORMAL HIGH (ref 70–99)

## 2021-08-17 LAB — PROCALCITONIN: Procalcitonin: 2.23 ng/mL

## 2021-08-17 LAB — VITAMIN B12: Vitamin B-12: 375 pg/mL (ref 180–914)

## 2021-08-17 MED ORDER — INSULIN ASPART 100 UNIT/ML IJ SOLN
0.0000 [IU] | Freq: Every day | INTRAMUSCULAR | Status: DC
  Administered 2021-08-17: 5 [IU] via SUBCUTANEOUS

## 2021-08-17 MED ORDER — INSULIN ASPART 100 UNIT/ML IJ SOLN
0.0000 [IU] | Freq: Three times a day (TID) | INTRAMUSCULAR | Status: DC
  Administered 2021-08-17 (×2): 5 [IU] via SUBCUTANEOUS
  Administered 2021-08-17: 3 [IU] via SUBCUTANEOUS
  Administered 2021-08-18: 5 [IU] via SUBCUTANEOUS
  Administered 2021-08-18: 2 [IU] via SUBCUTANEOUS
  Administered 2021-08-18: 7 [IU] via SUBCUTANEOUS

## 2021-08-17 NOTE — Hospital Course (Addendum)
Thomas Sandoval is a 66 yo male with PMH combined systolic/diastolic CHF, COPD, HTN, GERD, a flutter, hard of hearing, PAD, DMII who presented with hypoglycemia.  He also had endorsed feeling more short of breath with mild productive cough.  He was last hospitalized from 07/16/2021 until 07/19/2021 when he left AMA.  He was treated for A-fib with RVR, aspiration pneumonia into the right lung.  He had a significant right-sided pneumonia which has shown continual improvement with repeat imaging since initial treatment.  Initial sputum culture was positive for strep pneumo.  He was started on D10 infusion given blood sugar found to be around 30 on work-up.  He was also started on broad-spectrum antibiotics due to concern for ongoing/recurrent pneumonia. Glucose levels stabilized and he was resumed back on diabetic diet.  Insulin regimen was adjusted and discussed with patient.  Given A1c of 5.7%, he likely does not require as much insulin administration as previous.  Lantus dose reduced to 20 units and sliding scale also modified.  He met with dietitian prior to discharge as well and was educated on diabetic diet further. He was also discharged with a further course of antibiotics for pneumonia treatment. He will need further follow-up outpatient regarding pulmonary nodule noted in the left upper lobe.  Findings from CT scan were discussed with patient and wife who voiced understanding.

## 2021-08-17 NOTE — ED Notes (Signed)
ED TO INPATIENT HANDOFF REPORT  ED Nurse Name and Phone #: Lysbeth Galas [phone]  S Name/Age/Gender Thomas Sandoval. 66 y.o. male Room/Bed: 031C/031C  Code Status   Code Status: Full Code  Home/SNF/Other Home Patient oriented to: self, place, time, and situation Is this baseline? Yes   Triage Complete: Triage complete  Chief Complaint Sepsis [facility]) [A41.9]  Triage Note Pt bib [facility] from home where his family says he passed out today. On EMS arrival, pt found with 33 CBG and 73% 3LNC. Pt wearing 3LNC all the time. Pt given 1 amp d50 en route and placed on NRB 15L. Pt is poor historian.   Allergies Allergies  Allergen Reactions   Azithromycin Other (See Comments) and Nausea And Vomiting    "Severe stomach cramps; told to list as an allergy by dr. Huel Cote ago"   Avelox [Moxifloxacin Hcl In Nacl] Other (See Comments)    Hemolysis  In 2012   Bactrim [Sulfamethoxazole-Trimethoprim] Diarrhea and Nausea And Vomiting    Level of Care/Admitting Diagnosis ED Disposition     ED Disposition  [place]: [place] [[id number]]  Level of Care: Progressive [102]  Admit to Progressive based on following criteria: MULTISYSTEM THREATS such as stable sepsis, metabolic/electrolyte imbalance with or without encephalopathy that is responding to early treatment.  May admit patient to [facility] or [facility] if equivalent level of care is available:: No  Covid Evaluation: Asymptomatic - no recent exposure (last 10 days) testing not required  Diagnosis: Sepsis [facility]) [[id number]]  Admitting Physician: Rise Patience [phone]  Attending Physician: Rise Patience [phone]  Estimated length of stay: past midnight tomorrow  Certification:: I certify this patient will need inpatient services for at least 2 midnights          B Medical/Surgery History Past Medical History:  Diagnosis Date   Arthritis    Bladder cancer  ([place])    Chronic back pain    Chronic combined systolic and diastolic CHF (congestive heart failure) ([place]) 07/16/2021   COPD (chronic obstructive pulmonary disease) ([place])    DDD (degenerative disc disease)    Diabetic retinopathy of both eyes ([place])    Essential hypertension    GERD (gastroesophageal reflux disease)    History of atrial flutter 02/2011   Converted to NSR with Cardizem   History of chronic bronchitis    History of hemolytic anemia 02/2011   secondary to Avelox   HOH (hard of hearing)    HOH (hard of hearing)    no eardrum and nerve damage on R, also HOH on L   Mitral valve prolapse    a. 2D Echo 11/27/14: EF 55-60%; images were inadequate for LV wall motion assessment, + mild late systolic mitral valve prolapse involving the anterior leaflet.   PAD (peripheral artery disease) ([place]) 04/2014   Dr Trula Slade; bilateral SFA occlusion, R mid, L distal   PAF (paroxysmal atrial fibrillation) ([place])    a. Dx 11/2014 during admission for perf ulcer.   Perforated ulcer ([place])    a. 11/2014 s/p surgery.   Productive cough    Smokers' cough ([place])    Type 1 diabetes mellitus ([place]) 1977   Past Surgical History:  Procedure Laterality Date   CARDIOVERSION N/A 01/20/2021   Procedure: CARDIOVERSION;  Surgeon: Fay Records, MD;  Location: [facility];  Service: Cardiovascular;  Laterality: N/A;   CYSTOSCOPY WITH URETEROSCOPY Right 08/14/2013   Procedure:  CYSTOSCOPY WITH URETEROSCOPY BLADDER BIOPSY ;  Surgeon: Claybon Jabs, MD;  Location: [facility];  Service: Urology;  Laterality: Right;   ESOPHAGOGASTRODUODENOSCOPY N/A 02/06/2013   Procedure: ESOPHAGOGASTRODUODENOSCOPY (EGD);  Surgeon: [place] Dragon, MD;  Location: [facility] ENDOSCOPY;  Service: Endoscopy;  Laterality: N/A;   LAPAROSCOPY N/A 11/25/2014   Procedure: LAPAROSCOPIC PRIMARY REPAIR OF PERFORATED PREPYLORIC ULCER WITH Silvestre Gunner;  Surgeon: Greer Pickerel, MD;  Location: [facility];  Service: General;  Laterality: N/A;    TONSILLECTOMY  as child   TRANSTHORACIC ECHOCARDIOGRAM  02-17-2011   MODERATE LVH/  EF 65%   TRANSURETHRAL RESECTION OF BLADDER TUMOR WITH GYRUS (TURBT-GYRUS) N/A 06/12/2013   Procedure: TRANSURETHRAL RESECTION OF BLADDER TUMOR WITH GYRUS (TURBT-GYRUS);  Surgeon: Claybon Jabs, MD;  Location: [facility];  Service: Urology;  Laterality: N/A;   TYMPANIC MEMBRANE REPAIR  as child     A IV Location/Drains/Wounds Patient Lines/Drains/Airways Status     Active Line/Drains/Airways     Name Placement date Placement time Site Days   Peripheral IV 08/16/21 20 G Anterior;Right Forearm 08/16/21  1722  Forearm  1   External Urinary Catheter 12/04/20  0930  --  256   Incision (Closed) 06/12/13 Perineum Other (Comment) 06/12/13  1021  -- 2988   Incision (Closed) 08/14/13 Perineum Other (Comment) 08/14/13  0914  -- 2925   Incision - 4 Ports Abdomen 1: Right;Lateral 2: Right;Medial 3: Umbilicus 4: Left;Medial 11/25/14  0756  -- 2457            Intake/Output Last 24 hours No intake or output data in the 24 hours ending 08/17/21 1312  Labs/Imaging Results for orders placed or performed during the hospital encounter of 08/16/21 (from the past 48 hour(s))  CBG monitoring, ED     Status: Abnormal   Collection Time: 08/16/21  5:16 PM  Result Value Ref Range   Glucose-Capillary 61 (L) 70 - 99 mg/dL    Comment: Glucose reference range applies only to samples taken after fasting for at least 8 hours.  Comprehensive metabolic panel     Status: Abnormal   Collection Time: 08/16/21  5:23 PM  Result Value Ref Range   Sodium 133 (L) 135 - 145 mmol/L   Potassium 3.7 3.5 - 5.1 mmol/L   Chloride 95 (L) 98 - 111 mmol/L   CO2 25 22 - 32 mmol/L   Glucose, Bld 60 (L) 70 - 99 mg/dL    Comment: Glucose reference range applies only to samples taken after fasting for at least 8 hours.   BUN 14 8 - 23 mg/dL   Creatinine, Ser 1.44 (H) 0.61 - 1.24 mg/dL   Calcium 10.4 (H) 8.9 - 10.3 mg/dL   Total  Protein 7.3 6.5 - 8.1 g/dL   Albumin 3.4 (L) 3.5 - 5.0 g/dL   AST 20 15 - 41 U/L   ALT 9 0 - 44 U/L   Alkaline Phosphatase 71 38 - 126 U/L   Total Bilirubin 0.9 0.3 - 1.2 mg/dL   GFR, Estimated 55 (L) >60 mL/min    Comment: (NOTE) Calculated using the CKD-EPI Creatinine Equation (2021)    Anion gap 13 5 - 15    Comment: Performed at [facility], [place] [address] [place], [place] [address]  Lactic acid, plasma     Status: Abnormal   Collection Time: 08/16/21  5:23 PM  Result Value Ref Range   Lactic Acid, Venous 2.2 (HH) 0.5 - 1.9 mmol/L  Comment: CRITICAL RESULT CALLED TO, READ BACK BY AND VERIFIED WITH: C.Brogan England RN @ 2202 08/16/2021 BY C.EDENS Performed at [facility], [place] [address], [place], [place] [address]   CBC with Differential     Status: Abnormal   Collection Time: 08/16/21  5:23 PM  Result Value Ref Range   WBC 12.8 (H) 4.0 - 10.5 K/uL   RBC 3.93 (L) 4.22 - 5.81 MIL/uL   Hemoglobin 13.7 13.0 - 17.0 g/dL   HCT 41.7 39.0 - 52.0 %   MCV 106.1 (H) 80.0 - 100.0 fL   MCH 34.9 (H) 26.0 - 34.0 pg   MCHC 32.9 30.0 - 36.0 g/dL   RDW 13.6 11.5 - 15.5 %   Platelets 343 150 - 400 K/uL   nRBC 0.0 0.0 - 0.2 %   Neutrophils Relative % 83 %   Neutro Abs 10.6 (H) 1.7 - 7.7 K/uL   Lymphocytes Relative 7 %   Lymphs Abs 0.9 0.7 - 4.0 K/uL   Monocytes Relative 9 %   Monocytes Absolute 1.1 (H) 0.1 - 1.0 K/uL   Eosinophils Relative 0 %   Eosinophils Absolute 0.0 0.0 - 0.5 K/uL   Basophils Relative 0 %   Basophils Absolute 0.1 0.0 - 0.1 K/uL   Immature Granulocytes 1 %   Abs Immature Granulocytes 0.07 0.00 - 0.07 K/uL    Comment: Performed at [facility], [place] [address], [place], [place] [address]  Protime-INR     Status: Abnormal   Collection Time: 08/16/21  5:23 PM  Result Value Ref Range   Prothrombin Time 17.3 (H) 11.4 - 15.2 seconds   INR 1.4 (H) 0.8 - 1.2    Comment: (NOTE) INR goal varies based on device and disease states. Performed at [facility], [place] [address] [place], [place] [address]   CBG monitoring, ED     Status: Abnormal   Collection Time: 08/16/21  7:10 PM  Result Value Ref Range   Glucose-Capillary 208 (H) 70 - 99 mg/dL    Comment: Glucose reference range applies only to samples taken after fasting for at least 8 hours.   Comment 1 Notify RN    Comment 2 Document in Chart   Lactic acid, plasma     Status: None   Collection Time: 08/16/21  8:30 PM  Result Value Ref Range   Lactic Acid, Venous 1.7 0.5 - 1.9 mmol/L    Comment: Performed at [facility], [place] [address] [place], [place] [address]  I-Stat venous blood gas, [facility] ED only)     Status: Abnormal   Collection Time: 08/16/21  8:37 PM  Result Value Ref Range   pH, Ven 7.469 (H) 7.25 - 7.43   pCO2, Ven 37.2 (L) 44 - 60 mmHg   pO2, Ven 73 (H) 32 - 45 mmHg   Bicarbonate 27.0 20.0 - 28.0 mmol/L   TCO2 28 22 - 32 mmol/L   O2 Saturation 96 %   Acid-Base Excess 3.0 (H) 0.0 - 2.0 mmol/L   Sodium 131 (L) 135 - 145 mmol/L   Potassium 4.3 3.5 - 5.1 mmol/L   Calcium, Ion 1.19 1.15 - 1.40 mmol/L   HCT 42.0 39.0 - 52.0 %   Hemoglobin 14.3 13.0 - 17.0 g/dL   Sample type VENOUS   CBG monitoring, ED     Status: Abnormal   Collection Time: 08/16/21  8:52 PM  Result Value Ref Range   Glucose-Capillary 236 (H) 70 -  99 mg/dL    Comment: Glucose reference range applies only to samples taken after fasting for at least 8 hours.  CBG monitoring, ED     Status: Abnormal   Collection Time: 08/17/21 12:38 AM  Result Value Ref Range   Glucose-Capillary 273 (H) 70 - 99 mg/dL    Comment: Glucose reference range applies only to samples taken after fasting for at least 8 hours.   Comment 1 Notify RN    Comment 2 Document in Chart   CBG monitoring, ED     Status: Abnormal   Collection Time: 08/17/21  5:53 AM  Result Value Ref Range   Glucose-Capillary 279 (H) 70 - 99 mg/dL    Comment: Glucose reference range applies only to samples taken after fasting for at least 8  hours.  Comprehensive metabolic panel     Status: Abnormal   Collection Time: 08/17/21  6:30 AM  Result Value Ref Range   Sodium 132 (L) 135 - 145 mmol/L   Potassium 4.7 3.5 - 5.1 mmol/L   Chloride 96 (L) 98 - 111 mmol/L   CO2 24 22 - 32 mmol/L   Glucose, Bld 306 (H) 70 - 99 mg/dL    Comment: Glucose reference range applies only to samples taken after fasting for at least 8 hours.   BUN 15 8 - 23 mg/dL   Creatinine, Ser 1.27 (H) 0.61 - 1.24 mg/dL   Calcium 9.5 8.9 - 10.3 mg/dL   Total Protein 6.2 (L) 6.5 - 8.1 g/dL   Albumin 2.8 (L) 3.5 - 5.0 g/dL   AST 20 15 - 41 U/L   ALT 11 0 - 44 U/L   Alkaline Phosphatase 56 38 - 126 U/L   Total Bilirubin 1.8 (H) 0.3 - 1.2 mg/dL   GFR, Estimated >60 >60 mL/min    Comment: (NOTE) Calculated using the CKD-EPI Creatinine Equation (2021)    Anion gap 12 5 - 15    Comment: Performed at [facility], [place] [address] [place], [place] [address]  CBC     Status: Abnormal   Collection Time: 08/17/21  6:30 AM  Result Value Ref Range   WBC 16.9 (H) 4.0 - 10.5 K/uL   RBC 3.39 (L) 4.22 - 5.81 MIL/uL   Hemoglobin 11.9 (L) 13.0 - 17.0 g/dL   HCT 35.6 (L) 39.0 - 52.0 %   MCV 105.0 (H) 80.0 - 100.0 fL   MCH 35.1 (H) 26.0 - 34.0 pg   MCHC 33.4 30.0 - 36.0 g/dL   RDW 13.7 11.5 - 15.5 %   Platelets 252 150 - 400 K/uL   nRBC 0.0 0.0 - 0.2 %    Comment: Performed at [facility], [place] [address] [place], [place] [address]  Troponin I (High Sensitivity)     Status: Abnormal   Collection Time: 08/17/21  6:30 AM  Result Value Ref Range   Troponin I (High Sensitivity) 240 (HH) <18 ng/L    Comment: CRITICAL RESULT CALLED TO, READ BACK BY AND VERIFIED WITH: C.Jisell Majer RN @ [phone] 08/17/2021 BY C.EDENS (NOTE) Elevated high sensitivity troponin I (hsTnI) values and significant  changes across serial measurements may suggest ACS but many other  chronic and acute conditions are known to elevate hsTnI results.  Refer to the Links section for chest pain  algorithms and additional  guidance. Performed at [facility], [place] [address] [place], [place] [address]   TSH     Status: Abnormal   Collection  Time: 08/17/21  6:30 AM  Result Value Ref Range   TSH 0.322 (L) 0.350 - 4.500 uIU/mL    Comment: Performed by a 3rd Generation assay with a functional sensitivity of <=0.01 uIU/mL. Performed at [facility], [place] [address] [place], [place] [address]   CBG monitoring, ED     Status: Abnormal   Collection Time: 08/17/21  7:47 AM  Result Value Ref Range   Glucose-Capillary 297 (H) 70 - 99 mg/dL    Comment: Glucose reference range applies only to samples taken after fasting for at least 8 hours.  CBG monitoring, ED     Status: Abnormal   Collection Time: 08/17/21 10:05 AM  Result Value Ref Range   Glucose-Capillary 292 (H) 70 - 99 mg/dL    Comment: Glucose reference range applies only to samples taken after fasting for at least 8 hours.   Comment 1 Notify RN    Comment 2 Document in Chart   CBG monitoring, ED     Status: Abnormal   Collection Time: 08/17/21 11:24 AM  Result Value Ref Range   Glucose-Capillary 212 (H) 70 - 99 mg/dL    Comment: Glucose reference range applies only to samples taken after fasting for at least 8 hours.   Comment 1 Notify RN    Comment 2 Document in Chart    CT CHEST WO CONTRAST  Result Date: 08/17/2021 CLINICAL DATA:  Pneumonia, complication suspected. EXAM: CT CHEST WITHOUT CONTRAST TECHNIQUE: Multidetector CT imaging of the chest was performed following the standard protocol without IV contrast. RADIATION DOSE REDUCTION: This exam was performed according to the departmental dose-optimization program which includes automated exposure control, adjustment of the mA and/or kV according to patient size and/or use of iterative reconstruction technique. COMPARISON:  08/16/2021. FINDINGS: Cardiovascular: The heart is enlarged and there is no pericardial effusion. Three-vessel coronary artery calcifications  are noted. There is atherosclerotic calcification of the aorta without evidence of aneurysm. The pulmonary trunk is distended suggesting underlying pulmonary artery hypertension. Mediastinum/Nodes: No enlarged mediastinal or axillary lymph nodes by size criteria. Evaluation of the hila is limited due to lack of IV contrast. Thyroid gland, trachea, and esophagus demonstrate no significant findings. Lungs/Pleura: Apical pleural scarring is noted bilaterally. Mild emphysematous changes are noted in the lungs and there is fibrosis in the right upper lobe. Scattered ground-glass and patchy airspace opacities are noted bilaterally, greater on the right than on the left. There is consolidation in the right lower lobe. No effusion or pneumothorax. A few scattered pulmonary nodules are noted bilaterally, the largest in the left upper lobe measuring 1.7 cm, axial image 36. Upper Abdomen: No acute abnormality. Musculoskeletal: No chest wall mass or suspicious bone lesions identified. IMPRESSION: 1. Ground-glass and patchy airspace opacities in the lungs bilaterally and right lower lobe consolidation suspicious for pneumonia. 2. Scattered nodular opacities in the lungs bilaterally measuring up to 1.7 cm in the left upper lobe. Findings may be infectious, inflammatory, or neoplastic. Consider one of the following in 3 months for both low-risk and high-risk individuals: (a) repeat chest CT, (b) follow-up PET-CT, or (c) tissue sampling. This recommendation follows the consensus statement: Guidelines for Management of Incidental Pulmonary Nodules Detected on CT Images: From the Fleischner Society 2017; Radiology 2017; 284:228-243. 3. Cardiomegaly with multi-vessel coronary artery calcifications. 4. Aortic atherosclerosis. Electronically Signed   By: Brett Fairy M.D.   On: 08/17/2021 04:44   DG Chest Portable 1 View  Result Date: 08/16/2021 CLINICAL DATA:  Dyspnea EXAM:  PORTABLE CHEST 1 VIEW COMPARISON:  07/26/2021 FINDINGS:  Right basilar consolidation appears relatively stable since prior examination suggesting recurrent infection or aspiration or an incompletely resolved infectious process. No pneumothorax or pleural effusion. Cardiac size within normal limits. Pulmonary vascularity is normal. No acute bone abnormality. IMPRESSION: Persistent consolidation within the right lung base, relatively stable since prior examination suggesting either incomplete resolution or recurrent infection or aspiration. Nonemergent contrast enhanced CT examination may be helpful to exclude the presence of a central obstructing lesion. Electronically Signed   By: Fidela Salisbury M.D.   On: 08/16/2021 18:44    Pending Labs Unresulted Labs (From admission, onward)     Start     Ordered   08/18/21 0500  Procalcitonin  Daily,   R      08/17/21 0744   08/17/21 1307  Expectorated Sputum Assessment w Gram Stain, Rflx to Resp Cult  Once,   R        08/17/21 1306   08/17/21 0747  Folate  Add-on,   AD        08/17/21 0746   08/17/21 0747  Vitamin B12  Add-on,   AD        08/17/21 0746   08/17/21 0745  Procalcitonin - Baseline  Add-on,   AD        08/17/21 0744   08/16/21 2204  Culture, blood (routine x 2) Call MD if unable to obtain prior to antibiotics being given  BLOOD CULTURE X 2,   R     Comments: If blood cultures drawn in Emergency Department - Do not draw and cancel order    08/16/21 2204   08/16/21 2203  HIV Antibody (routine testing w rflx)  (HIV Antibody (Routine testing w reflex) panel)  Once,   R        08/16/21 2204   08/16/21 1906  MRSA Next Gen by PCR, Nasal  (MRSA Screening)  Once,   URGENT        08/16/21 1906   08/16/21 1723  Culture, blood (Routine x 2)  BLOOD CULTURE X 2,   R      08/16/21 1723   08/16/21 1723  Urinalysis, Routine w reflex microscopic  Once,   URGENT        08/16/21 1723            Vitals/Pain Today's Vitals   08/17/21 1030 08/17/21 1045 08/17/21 1100 08/17/21 1125  BP:    123/63  Pulse:  67 69 65 64  Resp: 20 (!) '21 18 18  '$ Temp:      TempSrc:      SpO2: 95% 94% 96% 97%  PainSc:        Isolation Precautions No active isolations  Medications Medications  piperacillin-tazobactam (ZOSYN) IVPB 3.375 g (0 g Intravenous Stopped 08/17/21 1002)  vancomycin (VANCOREADY) IVPB 1250 mg/250 mL (has no administration in time range)  metoprolol succinate (TOPROL-XL) 24 hr tablet 100 mg (100 mg Oral Given 08/16/21 2356)  famotidine (PEPCID) tablet 20 mg (20 mg Oral Given 08/17/21 0915)  rivaroxaban (XARELTO) tablet 20 mg (20 mg Oral Given 08/16/21 2356)  fluticasone furoate-vilanterol (BREO ELLIPTA) 100-25 MCG/ACT 1 puff (1 puff Inhalation Given 08/17/21 1008)    And  umeclidinium bromide (INCRUSE ELLIPTA) 62.5 MCG/ACT 1 puff (1 puff Inhalation Given 08/17/21 1008)  acetaminophen (TYLENOL) tablet 650 mg (has no administration in time range)    Or  acetaminophen (TYLENOL) suppository 650 mg (has no administration in time range)  doxycycline (  VIBRAMYCIN) 100 mg in sodium chloride 0.9 % 250 mL IVPB (0 mg Intravenous Stopped 08/17/21 1126)  HYDROcodone-acetaminophen (NORCO/VICODIN) 5-325 MG per tablet 1 tablet (has no administration in time range)  insulin aspart (novoLOG) injection 0-5 Units (has no administration in time range)  insulin aspart (novoLOG) injection 0-9 Units (3 Units Subcutaneous Given 08/17/21 1207)  dextrose 50 % solution 50 mL (50 mLs Intravenous Given 08/16/21 1748)  ipratropium-albuterol (DUONEB) 0.5-2.5 (3) MG/3ML nebulizer solution 3 mL (3 mLs Nebulization Given 08/16/21 1753)  methylPREDNISolone sodium succinate (SOLU-MEDROL) 125 mg/2 mL injection 125 mg (125 mg Intravenous Given 08/16/21 1751)  vancomycin (VANCOREADY) IVPB 1500 mg/300 mL (0 mg Intravenous Stopped 08/16/21 2314)  piperacillin-tazobactam (ZOSYN) IVPB 3.375 g (0 g Intravenous Stopped 08/16/21 1948)    Mobility walks with device     Focused Assessments Pulmonary Assessment Handoff:  Lung sounds: L  Breath Sounds: Diminished R Breath Sounds: Diminished, Expiratory wheezes O2 Device: Nasal Cannula O2 Flow Rate (L/min): 9 L/min    R Recommendations: See Admitting Provider Note  Report given to:   Additional Notes: 9LNC and uses urinal

## 2021-08-17 NOTE — Progress Notes (Signed)
Progress Note    Thomas Sandoval.   XIH:[id number]  DOB: 02-Mar-1956  DOA: 08/16/2021     1 PCP: Chevis Pretty, FNP  Initial CC: SOB, cough, hypoglycemia  Hospital Course: [name] is a 66 yo male with PMH combined systolic/diastolic CHF, COPD, HTN, GERD, a flutter, hard of hearing, PAD, DMII who presented with hypoglycemia.  He also had endorsed feeling more short of breath with mild productive cough.  He was last hospitalized from 07/16/2021 until 07/19/2021 when he left AMA.  He was treated for A-fib with RVR, aspiration pneumonia into the right lung.  He had a significant right-sided pneumonia which has shown continual improvement with repeat imaging since initial treatment.  Initial sputum culture was positive for strep pneumo.  He was started on D10 infusion given blood sugar found to be around 30 on work-up.  He was also started on broad-spectrum antibiotics due to concern for ongoing/recurrent pneumonia.  Interval History:  Seen in the ER this morning and was comfortably resting in bed.  His oxygen has been able to be weaned down from nonrebreather down to 2 L this morning.  He has been on oxygen at home since his original pneumonia. He was also concerned about his glucose levels and we discussed his insulin regimen some.  His diet regimen however, is unpredictable and he does not try to maintain a diabetic friendly diet from his description.  Assessment and Plan: * Sepsis ([place]) - tachycardia, tachypnea, leukocytosis, pulm source ongoing - continue abx; see CAP  CAP (community acquired pneumonia) - Imaging reviewed from May 2023 from prior pneumonia presentation and serial imaging has shown continual improvement involving his right sided infiltrates.  He is afebrile on admission however endorsing ongoing productive cough and was found to be tachypneic with hypoxia and leukocytosis. -Reasonable to continue on antibiotics for now, but will de-escalate as soon as able.   Continue current regimen for now - Check procalcitonin - Obtain sputum culture if able  Acute respiratory failure with hypoxia ([place]) - States he has been on oxygen since initial pneumonia diagnosis.  Suspect he does not need as much oxygen as currently being applied.  Weaned him down to 2 L during evaluation in the ER from 5 L and he maintained mid 50s and up saturations - Continue weaning as able  Demand ischemia ([place]) - troponins from admission have trended up; suspect this is demand in setting of respiratory distress on admission - denies CP or other angina equivalents  - EKGs reviewed from admission; repeat EKG again today - trend trop until peaks  Chronic combined systolic and diastolic CHF (congestive heart failure) (HCC) - No edema appreciated on exam and low suspicion for CHF exacerbation at this time  AKI (acute kidney injury) ([place]) - baseline creatinine ~ 1 - patient presents with increase in creat >0.3 mg/dL above baseline, creat increase >1.5x baseline presumed to have occurred within past 7 days PTA - creat 1.44 on admission  -Found to be hypotensive on admission and received fluids with improvement in renal function - BMP in a.m.  Insulin dependent type 2 diabetes mellitus (HCC) - recent A1c 5.7 %  - disconnect between his diet and insulin regimen; he's having hypoglycemic episodes likely b/c on too much insulin as evidenced by A1c, but types of foods he's eating are not compliant with diabetic diet - will ask for RD to eval patient while here - hold off on basal insulin for now - continue SSI - received solumedrol  in ER causing hyperglycemia; holding off on further steroids at this time   COPD with chronic bronchitis and emphysema ([place]) - Continue home inhalers.  Steroid received on admission - Hold off on further steroids in setting of labile glucose levels  Hypertension associated with diabetes ([place]) - Home meds on hold for now in setting of hypotension on  admission  Atrial fibrillation with RVR ([place]) - Intermittently in RVR - TSH mildly suppressed, 0.3-2 - Check free T4 and total T3 - continue toprol     Old records reviewed in assessment of this patient  Antimicrobials: Doxycycline 08/16/21 >> current Zosyn 08/16/21 >> current Vanc 08/16/21 >> current   DVT prophylaxis:   rivaroxaban (XARELTO) tablet 20 mg   Code Status:   Code Status: Full Code  Disposition Plan:  Home in 1-2 days Status is: Inpt  Objective: Blood pressure (!) 113/53, pulse 76, temperature 98.4 F (36.9 C), temperature source Oral, resp. rate 18, SpO2 96 %.  Examination:  Physical Exam Constitutional:      General: He is not in acute distress.    Appearance: Normal appearance.     Comments: Hard of hearing   HENT:     Head: Normocephalic and atraumatic.     Mouth/Throat:     Mouth: Mucous membranes are moist.  Eyes:     Extraocular Movements: Extraocular movements intact.  Cardiovascular:     Rate and Rhythm: Normal rate and regular rhythm.     Heart sounds: Normal heart sounds.  Pulmonary:     Effort: Pulmonary effort is normal.     Comments: Coarse sounds bilaterally, no wheezing  Abdominal:     General: Bowel sounds are normal. There is no distension.     Palpations: Abdomen is soft.     Tenderness: There is no abdominal tenderness.  Musculoskeletal:        General: Normal range of motion.     Cervical back: Normal range of motion and neck supple.  Skin:    General: Skin is warm and dry.  Neurological:     General: No focal deficit present.     Mental Status: He is alert.  Psychiatric:        Mood and Affect: Mood normal.        Behavior: Behavior normal.      Consultants:    Procedures:    Data Reviewed: Results for orders placed or performed during the hospital encounter of 08/16/21 (from the past 24 hour(s))  CBG monitoring, ED     Status: Abnormal   Collection Time: 08/16/21  5:16 PM  Result Value Ref Range    Glucose-Capillary 61 (L) 70 - 99 mg/dL  Comprehensive metabolic panel     Status: Abnormal   Collection Time: 08/16/21  5:23 PM  Result Value Ref Range   Sodium 133 (L) 135 - 145 mmol/L   Potassium 3.7 3.5 - 5.1 mmol/L   Chloride 95 (L) 98 - 111 mmol/L   CO2 25 22 - 32 mmol/L   Glucose, Bld 60 (L) 70 - 99 mg/dL   BUN 14 8 - 23 mg/dL   Creatinine, Ser 1.44 (H) 0.61 - 1.24 mg/dL   Calcium 10.4 (H) 8.9 - 10.3 mg/dL   Total Protein 7.3 6.5 - 8.1 g/dL   Albumin 3.4 (L) 3.5 - 5.0 g/dL   AST 20 15 - 41 U/L   ALT 9 0 - 44 U/L   Alkaline Phosphatase 71 38 - 126 U/L   Total Bilirubin  0.9 0.3 - 1.2 mg/dL   GFR, Estimated 55 (L) >60 mL/min   Anion gap 13 5 - 15  Lactic acid, plasma     Status: Abnormal   Collection Time: 08/16/21  5:23 PM  Result Value Ref Range   Lactic Acid, Venous 2.2 (HH) 0.5 - 1.9 mmol/L  CBC with Differential     Status: Abnormal   Collection Time: 08/16/21  5:23 PM  Result Value Ref Range   WBC 12.8 (H) 4.0 - 10.5 K/uL   RBC 3.93 (L) 4.22 - 5.81 MIL/uL   Hemoglobin 13.7 13.0 - 17.0 g/dL   HCT 41.7 39.0 - 52.0 %   MCV 106.1 (H) 80.0 - 100.0 fL   MCH 34.9 (H) 26.0 - 34.0 pg   MCHC 32.9 30.0 - 36.0 g/dL   RDW 13.6 11.5 - 15.5 %   Platelets 343 150 - 400 K/uL   nRBC 0.0 0.0 - 0.2 %   Neutrophils Relative % 83 %   Neutro Abs 10.6 (H) 1.7 - 7.7 K/uL   Lymphocytes Relative 7 %   Lymphs Abs 0.9 0.7 - 4.0 K/uL   Monocytes Relative 9 %   Monocytes Absolute 1.1 (H) 0.1 - 1.0 K/uL   Eosinophils Relative 0 %   Eosinophils Absolute 0.0 0.0 - 0.5 K/uL   Basophils Relative 0 %   Basophils Absolute 0.1 0.0 - 0.1 K/uL   Immature Granulocytes 1 %   Abs Immature Granulocytes 0.07 0.00 - 0.07 K/uL  Protime-INR     Status: Abnormal   Collection Time: 08/16/21  5:23 PM  Result Value Ref Range   Prothrombin Time 17.3 (H) 11.4 - 15.2 seconds   INR 1.4 (H) 0.8 - 1.2  Culture, blood (Routine x 2)     Status: None (Preliminary result)   Collection Time: 08/16/21  5:23 PM    Specimen: BLOOD RIGHT FOREARM  Result Value Ref Range   Specimen Description BLOOD RIGHT FOREARM    Special Requests      BOTTLES DRAWN AEROBIC AND ANAEROBIC Blood Culture adequate volume   Culture      NO GROWTH < 24 HOURS Performed at [facility], [address] [place], [place] [address]    Report Status PENDING   CBG monitoring, ED     Status: Abnormal   Collection Time: 08/16/21  7:10 PM  Result Value Ref Range   Glucose-Capillary 208 (H) 70 - 99 mg/dL   Comment 1 Notify RN    Comment 2 Document in Chart   Lactic acid, plasma     Status: None   Collection Time: 08/16/21  8:30 PM  Result Value Ref Range   Lactic Acid, Venous 1.7 0.5 - 1.9 mmol/L  Culture, blood (Routine x 2)     Status: None (Preliminary result)   Collection Time: 08/16/21  8:30 PM   Specimen: BLOOD LEFT HAND  Result Value Ref Range   Specimen Description BLOOD LEFT HAND    Special Requests      BOTTLES DRAWN AEROBIC AND ANAEROBIC Blood Culture adequate volume   Culture      NO GROWTH < 24 HOURS Performed at [facility], [place] [address] [place], [place] [address]    Report Status PENDING   I-Stat venous blood gas, [facility] ED only)     Status: Abnormal   Collection Time: 08/16/21  8:37 PM  Result Value Ref Range   pH, Ven 7.469 (H) 7.25 - 7.43   pCO2, Ven  37.2 (L) 44 - 60 mmHg   pO2, Ven 73 (H) 32 - 45 mmHg   Bicarbonate 27.0 20.0 - 28.0 mmol/L   TCO2 28 22 - 32 mmol/L   O2 Saturation 96 %   Acid-Base Excess 3.0 (H) 0.0 - 2.0 mmol/L   Sodium 131 (L) 135 - 145 mmol/L   Potassium 4.3 3.5 - 5.1 mmol/L   Calcium, Ion 1.19 1.15 - 1.40 mmol/L   HCT 42.0 39.0 - 52.0 %   Hemoglobin 14.3 13.0 - 17.0 g/dL   Sample type VENOUS   CBG monitoring, ED     Status: Abnormal   Collection Time: 08/16/21  8:52 PM  Result Value Ref Range   Glucose-Capillary 236 (H) 70 - 99 mg/dL  CBG monitoring, ED     Status: Abnormal   Collection Time: 08/17/21 12:38 AM  Result Value Ref Range   Glucose-Capillary 273  (H) 70 - 99 mg/dL   Comment 1 Notify RN    Comment 2 Document in Chart   CBG monitoring, ED     Status: Abnormal   Collection Time: 08/17/21  5:53 AM  Result Value Ref Range   Glucose-Capillary 279 (H) 70 - 99 mg/dL  Comprehensive metabolic panel     Status: Abnormal   Collection Time: 08/17/21  6:30 AM  Result Value Ref Range   Sodium 132 (L) 135 - 145 mmol/L   Potassium 4.7 3.5 - 5.1 mmol/L   Chloride 96 (L) 98 - 111 mmol/L   CO2 24 22 - 32 mmol/L   Glucose, Bld 306 (H) 70 - 99 mg/dL   BUN 15 8 - 23 mg/dL   Creatinine, Ser 1.27 (H) 0.61 - 1.24 mg/dL   Calcium 9.5 8.9 - 10.3 mg/dL   Total Protein 6.2 (L) 6.5 - 8.1 g/dL   Albumin 2.8 (L) 3.5 - 5.0 g/dL   AST 20 15 - 41 U/L   ALT 11 0 - 44 U/L   Alkaline Phosphatase 56 38 - 126 U/L   Total Bilirubin 1.8 (H) 0.3 - 1.2 mg/dL   GFR, Estimated >60 >60 mL/min   Anion gap 12 5 - 15  CBC     Status: Abnormal   Collection Time: 08/17/21  6:30 AM  Result Value Ref Range   WBC 16.9 (H) 4.0 - 10.5 K/uL   RBC 3.39 (L) 4.22 - 5.81 MIL/uL   Hemoglobin 11.9 (L) 13.0 - 17.0 g/dL   HCT 35.6 (L) 39.0 - 52.0 %   MCV 105.0 (H) 80.0 - 100.0 fL   MCH 35.1 (H) 26.0 - 34.0 pg   MCHC 33.4 30.0 - 36.0 g/dL   RDW 13.7 11.5 - 15.5 %   Platelets 252 150 - 400 K/uL   nRBC 0.0 0.0 - 0.2 %  Troponin I (High Sensitivity)     Status: Abnormal   Collection Time: 08/17/21  6:30 AM  Result Value Ref Range   Troponin I (High Sensitivity) 240 (HH) <18 ng/L  TSH     Status: Abnormal   Collection Time: 08/17/21  6:30 AM  Result Value Ref Range   TSH 0.322 (L) 0.350 - 4.500 uIU/mL  Vitamin B12     Status: None   Collection Time: 08/17/21  7:45 AM  Result Value Ref Range   Vitamin B-12 375 180 - 914 pg/mL  CBG monitoring, ED     Status: Abnormal   Collection Time: 08/17/21  7:47 AM  Result Value Ref Range   Glucose-Capillary 297 (H) 70 -  99 mg/dL  CBG monitoring, ED     Status: Abnormal   Collection Time: 08/17/21 10:05 AM  Result Value Ref Range    Glucose-Capillary 292 (H) 70 - 99 mg/dL   Comment 1 Notify RN    Comment 2 Document in Chart   CBG monitoring, ED     Status: Abnormal   Collection Time: 08/17/21 11:24 AM  Result Value Ref Range   Glucose-Capillary 212 (H) 70 - 99 mg/dL   Comment 1 Notify RN    Comment 2 Document in Chart   Procalcitonin     Status: None   Collection Time: 08/17/21  3:19 PM  Result Value Ref Range   Procalcitonin 2.23 ng/mL  Glucose, capillary     Status: Abnormal   Collection Time: 08/17/21  4:42 PM  Result Value Ref Range   Glucose-Capillary 298 (H) 70 - 99 mg/dL    I have Reviewed nursing notes, Vitals, and Lab results since pt's last encounter. Pertinent lab results : see above I have ordered test including BMP, CBC, Mg I have reviewed the last note from staff over past 24 hours I have discussed pt's care plan and test results with nursing staff, case manager   LOS: 1 day   Dwyane Dee, MD [facility] 08/17/2021, 5:15 PM

## 2021-08-17 NOTE — Assessment & Plan Note (Signed)
-   troponins from admission have trended up; suspect this is demand in setting of respiratory distress on admission - denies CP or other angina equivalents  - EKGs reviewed from admission; repeat EKG on 6/11 showed NSR and no acute ischemia - trop peaked at 240 then downtrended

## 2021-08-17 NOTE — Assessment & Plan Note (Addendum)
-   Blood pressure improved after admission and home meds resumed at discharge

## 2021-08-17 NOTE — Assessment & Plan Note (Signed)
-   States he has been on oxygen since initial pneumonia diagnosis.  Suspect he does not need as much oxygen as currently being applied. -Ambulatory walk test performed prior to discharge.  While ambulating on room air oxygen saturations were 95 to 98%.  He did have subjective shortness of breath but no observed true hypoxia -He does have ongoing oxygen at home to continue using if does have some desaturations with exertion

## 2021-08-17 NOTE — Evaluation (Signed)
Clinical/Bedside Swallow Evaluation Patient Details  Name: Thomas Sandoval. MRN: [id number] Date of Birth: November 22, 1955  Today's Date: 08/17/2021 Time: SLP Start Time (ACUTE ONLY): 1259 SLP Stop Time (ACUTE ONLY): 1313 SLP Time Calculation (min) (ACUTE ONLY): 14 min  Past Medical History:  Past Medical History:  Diagnosis Date   Arthritis    Bladder cancer ([place])    Chronic back pain    Chronic combined systolic and diastolic CHF (congestive heart failure) ([place]) 07/16/2021   COPD (chronic obstructive pulmonary disease) (HCC)    DDD (degenerative disc disease)    Diabetic retinopathy of both eyes (HCC)    Essential hypertension    GERD (gastroesophageal reflux disease)    History of atrial flutter 02/2011   Converted to NSR with Cardizem   History of chronic bronchitis    History of hemolytic anemia 02/2011   secondary to Avelox   HOH (hard of hearing)    HOH (hard of hearing)    no eardrum and nerve damage on R, also HOH on L   Mitral valve prolapse    a. 2D Echo 11/27/14: EF 55-60%; images were inadequate for LV wall motion assessment, + mild late systolic mitral valve prolapse involving the anterior leaflet.   PAD (peripheral artery disease) ([place]) 04/2014   Dr Trula Slade; bilateral SFA occlusion, R mid, L distal   PAF (paroxysmal atrial fibrillation) ([place])    a. Dx 11/2014 during admission for perf ulcer.   Perforated ulcer ([place])    a. 11/2014 s/p surgery.   Productive cough    Smokers' cough ([place])    Type 1 diabetes mellitus ([place]) 1977   Past Surgical History:  Past Surgical History:  Procedure Laterality Date   CARDIOVERSION N/A 01/20/2021   Procedure: CARDIOVERSION;  Surgeon: Fay Records, MD;  Location: [facility] ENDOSCOPY;  Service: Cardiovascular;  Laterality: N/A;   CYSTOSCOPY WITH URETEROSCOPY Right 08/14/2013   Procedure: CYSTOSCOPY WITH URETEROSCOPY BLADDER BIOPSY ;  Surgeon: Claybon Jabs, MD;  Location[facility];  Service: Urology;  Laterality: Right;    ESOPHAGOGASTRODUODENOSCOPY N/A 02/06/2013   Procedure: ESOPHAGOGASTRODUODENOSCOPY (EGD);  Surgeon: Lafayette Dragon, MD;  Location[facility] ENDOSCOPY;  Service: Endoscopy;  Laterality: N/A;   LAPAROSCOPY N/A 11/25/2014   Procedure: LAPAROSCOPIC PRIMARY REPAIR OF PERFORATED PREPYLORIC ULCER WITH Silvestre Gunner;  Surgeon: Greer Pickerel, MD;  Location: [facility];  Service: General;  Laterality: N/A;   TONSILLECTOMY  as child   TRANSTHORACIC ECHOCARDIOGRAM  02-17-2011   MODERATE LVH/  EF 65%   TRANSURETHRAL RESECTION OF BLADDER TUMOR WITH GYRUS (TURBT-GYRUS) N/A 06/12/2013   Procedure: TRANSURETHRAL RESECTION OF BLADDER TUMOR WITH GYRUS (TURBT-GYRUS);  Surgeon: Claybon Jabs, MD;  Location: [facility];  Service: Urology;  Laterality: N/A;   TYMPANIC MEMBRANE REPAIR  as child   HPI:  Thomas Sandoval. is a 66 y.o. male who presented to the ER with complaints of having low blood sugar was found to have blood sugar of around 30. Pt was admitted last month for pneumonia left AMA.   Over the last 1 week patient has been more short of breath than usual productive cough denies any chest pain.  In ED patient was found to be short of breath requiring initially nonrebreather chest x-ray shows persistent consolidation and was started on empiric antibiotics for pneumonia.  Patient admitted for sepsis secondary to pneumonia. Pt with with history of diabetes mellitus type 2, chronic combined systolic and diastolic CHF last EF measured in March  2023 was 55 to 60% with grade 2 diastolic dysfunction, atrial fibrillation, hypertension, chronic pain history of tobacco or alcohol use.    Assessment / Plan / Recommendation  Clinical Impression  Pt presents with functional swallowing as assessed clinically.  Pt tolerated all consistencies trialed with no clinical s/s of aspiration.  Pt exhibited good oral clearance of solids.  Clinical swallow evaluation cannot rule out silent aspiration, and given that pt was recently  admitted for pneumonia in May and again has pneumonia this admission there may still be a concern for prandial aspiration.    Recommend pt continue regular texture diet with thin liquid. If there is a specific concern for silent aspiration, please place orders for MBSS.  SLP Visit Diagnosis: Dysphagia, unspecified (R13.10)    Aspiration Risk  No limitations    Diet Recommendation Regular;Thin liquid   Liquid Administration via: Cup;Straw Medication Administration: Whole meds with liquid Supervision: Patient able to self feed Compensations: Slow rate;Small sips/bites Postural Changes: Seated upright at 90 degrees    Other  Recommendations Oral Care Recommendations: Oral care BID    Recommendations for follow up therapy are one component of a multi-disciplinary discharge planning process, led by the attending physician.  Recommendations may be updated based on patient status, additional functional criteria and insurance authorization.  Follow up Recommendations No SLP follow up      Assistance Recommended at Discharge None  Functional Status Assessment Patient has not had a recent decline in their functional status  Frequency and Duration  (N/A)          Prognosis Prognosis for Safe Diet Advancement:  (N/A)      Swallow Study   General Date of Onset: 08/16/21 HPI: Thomas Sandoval. is a 66 y.o. male who presented to the ER with complaints of having low blood sugar was found to have blood sugar of around 30. Pt was admitted last month for pneumonia left AMA.   Over the last 1 week patient has been more short of breath than usual productive cough denies any chest pain.  In ED patient was found to be short of breath requiring initially nonrebreather chest x-ray shows persistent consolidation and was started on empiric antibiotics for pneumonia.  Patient admitted for sepsis secondary to pneumonia. Pt with with history of diabetes mellitus type 2, chronic combined systolic and  diastolic CHF last EF measured in March 2023 was 1 to 60% with grade 2 diastolic dysfunction, atrial fibrillation, hypertension, chronic pain history of tobacco or alcohol use. Type of Study: Bedside Swallow Evaluation Diet Prior to this Study: Regular;Thin liquids Temperature Spikes Noted: No Respiratory Status: Nasal cannula History of Recent Intubation: No Behavior/Cognition: Alert;Cooperative;Pleasant mood Oral Cavity Assessment: Within Functional Limits Oral Care Completed by SLP: No Oral Cavity - Dentition: Adequate natural dentition Vision: Functional for self-feeding Self-Feeding Abilities: Able to feed self Patient Positioning: Upright in bed Baseline Vocal Quality: Normal Volitional Cough: Strong Volitional Swallow: Able to elicit    Oral/Motor/Sensory Function Overall Oral Motor/Sensory Function: Within functional limits Facial ROM: Within Functional Limits Facial Symmetry: Within Functional Limits Lingual ROM: Within Functional Limits Lingual Symmetry: Within Functional Limits Lingual Strength: Within Functional Limits Velum: Within Functional Limits Mandible: Within Functional Limits   Ice Chips Ice chips: Not tested   Thin Liquid Thin Liquid: Within functional limits Presentation: Cup;Straw    Nectar Thick Nectar Thick Liquid: Not tested   Honey Thick Honey Thick Liquid: Not tested   Puree Puree: Within functional limits Presentation: Spoon  Solid     Solid: Within functional limits Presentation: [place], [place], [place] Office: [phone] 08/17/2021,2:05 PM

## 2021-08-17 NOTE — Assessment & Plan Note (Signed)
-   No edema appreciated on exam and low suspicion for CHF exacerbation at this time

## 2021-08-17 NOTE — Assessment & Plan Note (Signed)
-   recent A1c 5.7 %  - disconnect between his diet and insulin regimen; he's having hypoglycemic episodes likely b/c on too much insulin as evidenced by A1c, but types of foods he's eating are not compliant with diabetic diet -Evaluated by registered dietitian prior to discharge  - decrease Lantus to 20 units daily - sliding scale modified  - further modification as indicated at follow up

## 2021-08-17 NOTE — Assessment & Plan Note (Signed)
-   Continue home inhalers.  Steroid received on admission - Hold off on further steroids in setting of labile glucose levels

## 2021-08-17 NOTE — Assessment & Plan Note (Signed)
Further- Imaging reviewed from May 2023 from prior pneumonia presentation and serial imaging has shown continual improvement involving his right sided infiltrates.  He is afebrile on admission however endorsing ongoing productive cough and was found to be tachypneic with hypoxia and leukocytosis. -Reasonable to continue on antibiotics for now, but will de-escalate as soon as able.  Continue current regimen for now - Check procalcitonin (2.23 >> 2.02) - treated with Vanc/zosyn and doxy on admission; transition to Augmentin and doxycycline to complete course at discharge

## 2021-08-17 NOTE — Progress Notes (Signed)
Progress Note    Thomas Sandoval.   MCN:[id number]  DOB: Oct 21, 1955  DOA: 08/16/2021     1 PCP: Chevis Pretty, FNP  Initial CC: SOB, cough, hypoglycemia  Hospital Course: Thomas Sandoval is a 66 yo male with PMH combined systolic/diastolic CHF, COPD, HTN, GERD, a flutter, hard of hearing, PAD, DMII who presented with hypoglycemia.  He also had endorsed feeling more short of breath with mild productive cough.  He was last hospitalized from 07/16/2021 until 07/19/2021 when he left AMA.  He was treated for A-fib with RVR, aspiration pneumonia into the right lung.  He had a significant right-sided pneumonia which has shown continual improvement with repeat imaging since initial treatment.  Initial sputum culture was positive for strep pneumo.  He was started on D10 infusion given blood sugar found to be around 30 on work-up.  He was also started on broad-spectrum antibiotics due to concern for ongoing/recurrent pneumonia.  Interval History:  Seen in the ER this morning and was comfortably resting in bed.  His oxygen has been able to be weaned down from nonrebreather down to 2 L this morning.  He has been on oxygen at home since his original pneumonia. He was also concerned about his glucose levels and we discussed his insulin regimen some.  His diet regimen however, is unpredictable and he does not try to maintain a diabetic friendly diet from his description.  Assessment and Plan: CAP (community acquired pneumonia) - Imaging reviewed from May 2023 from prior pneumonia presentation and serial imaging has shown continual improvement involving his right sided infiltrates.  He is afebrile on admission however endorsing ongoing productive cough and was found to be tachypneic with hypoxia and leukocytosis. -Reasonable to continue on antibiotics for now, but will de-escalate as soon as able.  Continue current regimen for now - Check procalcitonin - Obtain sputum culture if able  Acute  respiratory failure with hypoxia ([place]) - States he has been on oxygen since initial pneumonia diagnosis.  Suspect he does not need as much oxygen as currently being applied.  Weaned him down to 2 L during evaluation in the ER from 5 L and he maintained mid 65s and up saturations - Continue weaning as able  Chronic combined systolic and diastolic CHF (congestive heart failure) (HCC) - No edema appreciated on exam and low suspicion for CHF exacerbation at this time  AKI (acute kidney injury) ([place]) - baseline creatinine ~ 1 - patient presents with increase in creat >0.3 mg/dL above baseline, creat increase >1.5x baseline presumed to have occurred within past 7 days PTA - creat 1.44 on admission  -Found to be hypotensive on admission and received fluids with improvement in renal function - BMP in a.m.  Insulin dependent type 2 diabetes mellitus (HCC) - recent A1c 5.7 %  - disconnect between his diet and insulin regimen; he's having hypoglycemic episodes likely b/c on too much insulin as evidenced by A1c, but types of foods he's eating are not compliant with diabetic diet - will ask for RD to eval patient while here - hold off on basal insulin for now - continue SSI - received solumedrol in ER causing hyperglycemia; holding off on further steroids at this time   COPD with chronic bronchitis and emphysema ([place]) - Continue home inhalers.  Steroid received on admission - Hold off on further steroids in setting of labile glucose levels  Hypertension associated with diabetes ([place]) - Home meds on hold for now in setting of hypotension on  admission  Sepsis ([place]) - tachycardia, tachypnea, leukocytosis, pulm source ongoing - continue abx; see CAP  Atrial fibrillation with RVR (HCC) - Intermittently in RVR - TSH mildly suppressed, 0.3-2 - Check free T4 and total T3 - continue toprol     Old records reviewed in assessment of this patient  Antimicrobials: Doxycycline 08/16/21 >>  current Zosyn 08/16/21 >> current Vanc 08/16/21 >> current   DVT prophylaxis:  rivaroxaban (XARELTO) tablet 20 mg Start: 08/16/21 2215 rivaroxaban (XARELTO) tablet 20 mg   Code Status:   Code Status: Full Code  Disposition Plan:  Home in 1-2 days Status is: Inpt  Objective: Blood pressure 123/63, pulse 64, temperature 98.9 F (37.2 C), temperature source Rectal, resp. rate 18, SpO2 97 %.  Examination:  Physical Exam Constitutional:      General: He is not in acute distress.    Appearance: Normal appearance.     Comments: Hard of hearing   HENT:     Head: Normocephalic and atraumatic.     Mouth/Throat:     Mouth: Mucous membranes are moist.  Eyes:     Extraocular Movements: Extraocular movements intact.  Cardiovascular:     Rate and Rhythm: Normal rate and regular rhythm.     Heart sounds: Normal heart sounds.  Pulmonary:     Effort: Pulmonary effort is normal.     Comments: Coarse sounds bilaterally, no wheezing  Abdominal:     General: Bowel sounds are normal. There is no distension.     Palpations: Abdomen is soft.     Tenderness: There is no abdominal tenderness.  Musculoskeletal:        General: Normal range of motion.     Cervical back: Normal range of motion and neck supple.  Skin:    General: Skin is warm and dry.  Neurological:     General: No focal deficit present.     Mental Status: He is alert.  Psychiatric:        Mood and Affect: Mood normal.        Behavior: Behavior normal.      Consultants:    Procedures:    Data Reviewed: Results for orders placed or performed during the hospital encounter of 08/16/21 (from the past 24 hour(s))  CBG monitoring, ED     Status: Abnormal   Collection Time: 08/16/21  5:16 PM  Result Value Ref Range   Glucose-Capillary 61 (L) 70 - 99 mg/dL  Comprehensive metabolic panel     Status: Abnormal   Collection Time: 08/16/21  5:23 PM  Result Value Ref Range   Sodium 133 (L) 135 - 145 mmol/L   Potassium 3.7 3.5 -  5.1 mmol/L   Chloride 95 (L) 98 - 111 mmol/L   CO2 25 22 - 32 mmol/L   Glucose, Bld 60 (L) 70 - 99 mg/dL   BUN 14 8 - 23 mg/dL   Creatinine, Ser 1.44 (H) 0.61 - 1.24 mg/dL   Calcium 10.4 (H) 8.9 - 10.3 mg/dL   Total Protein 7.3 6.5 - 8.1 g/dL   Albumin 3.4 (L) 3.5 - 5.0 g/dL   AST 20 15 - 41 U/L   ALT 9 0 - 44 U/L   Alkaline Phosphatase 71 38 - 126 U/L   Total Bilirubin 0.9 0.3 - 1.2 mg/dL   GFR, Estimated 55 (L) >60 mL/min   Anion gap 13 5 - 15  Lactic acid, plasma     Status: Abnormal   Collection Time: 08/16/21  5:23 PM  Result Value Ref Range   Lactic Acid, Venous 2.2 (HH) 0.5 - 1.9 mmol/L  CBC with Differential     Status: Abnormal   Collection Time: 08/16/21  5:23 PM  Result Value Ref Range   WBC 12.8 (H) 4.0 - 10.5 K/uL   RBC 3.93 (L) 4.22 - 5.81 MIL/uL   Hemoglobin 13.7 13.0 - 17.0 g/dL   HCT 41.7 39.0 - 52.0 %   MCV 106.1 (H) 80.0 - 100.0 fL   MCH 34.9 (H) 26.0 - 34.0 pg   MCHC 32.9 30.0 - 36.0 g/dL   RDW 13.6 11.5 - 15.5 %   Platelets 343 150 - 400 K/uL   nRBC 0.0 0.0 - 0.2 %   Neutrophils Relative % 83 %   Neutro Abs 10.6 (H) 1.7 - 7.7 K/uL   Lymphocytes Relative 7 %   Lymphs Abs 0.9 0.7 - 4.0 K/uL   Monocytes Relative 9 %   Monocytes Absolute 1.1 (H) 0.1 - 1.0 K/uL   Eosinophils Relative 0 %   Eosinophils Absolute 0.0 0.0 - 0.5 K/uL   Basophils Relative 0 %   Basophils Absolute 0.1 0.0 - 0.1 K/uL   Immature Granulocytes 1 %   Abs Immature Granulocytes 0.07 0.00 - 0.07 K/uL  Protime-INR     Status: Abnormal   Collection Time: 08/16/21  5:23 PM  Result Value Ref Range   Prothrombin Time 17.3 (H) 11.4 - 15.2 seconds   INR 1.4 (H) 0.8 - 1.2  CBG monitoring, ED     Status: Abnormal   Collection Time: 08/16/21  7:10 PM  Result Value Ref Range   Glucose-Capillary 208 (H) 70 - 99 mg/dL   Comment 1 Notify RN    Comment 2 Document in Chart   Lactic acid, plasma     Status: None   Collection Time: 08/16/21  8:30 PM  Result Value Ref Range   Lactic Acid,  Venous 1.7 0.5 - 1.9 mmol/L  I-Stat venous blood gas, [facility] ED only)     Status: Abnormal   Collection Time: 08/16/21  8:37 PM  Result Value Ref Range   pH, Ven 7.469 (H) 7.25 - 7.43   pCO2, Ven 37.2 (L) 44 - 60 mmHg   pO2, Ven 73 (H) 32 - 45 mmHg   Bicarbonate 27.0 20.0 - 28.0 mmol/L   TCO2 28 22 - 32 mmol/L   O2 Saturation 96 %   Acid-Base Excess 3.0 (H) 0.0 - 2.0 mmol/L   Sodium 131 (L) 135 - 145 mmol/L   Potassium 4.3 3.5 - 5.1 mmol/L   Calcium, Ion 1.19 1.15 - 1.40 mmol/L   HCT 42.0 39.0 - 52.0 %   Hemoglobin 14.3 13.0 - 17.0 g/dL   Sample type VENOUS   CBG monitoring, ED     Status: Abnormal   Collection Time: 08/16/21  8:52 PM  Result Value Ref Range   Glucose-Capillary 236 (H) 70 - 99 mg/dL  CBG monitoring, ED     Status: Abnormal   Collection Time: 08/17/21 12:38 AM  Result Value Ref Range   Glucose-Capillary 273 (H) 70 - 99 mg/dL   Comment 1 Notify RN    Comment 2 Document in Chart   CBG monitoring, ED     Status: Abnormal   Collection Time: 08/17/21  5:53 AM  Result Value Ref Range   Glucose-Capillary 279 (H) 70 - 99 mg/dL  Comprehensive metabolic panel     Status: Abnormal   Collection Time: 08/17/21  6:30 AM  Result Value Ref Range   Sodium 132 (L) 135 - 145 mmol/L   Potassium 4.7 3.5 - 5.1 mmol/L   Chloride 96 (L) 98 - 111 mmol/L   CO2 24 22 - 32 mmol/L   Glucose, Bld 306 (H) 70 - 99 mg/dL   BUN 15 8 - 23 mg/dL   Creatinine, Ser 1.27 (H) 0.61 - 1.24 mg/dL   Calcium 9.5 8.9 - 10.3 mg/dL   Total Protein 6.2 (L) 6.5 - 8.1 g/dL   Albumin 2.8 (L) 3.5 - 5.0 g/dL   AST 20 15 - 41 U/L   ALT 11 0 - 44 U/L   Alkaline Phosphatase 56 38 - 126 U/L   Total Bilirubin 1.8 (H) 0.3 - 1.2 mg/dL   GFR, Estimated >60 >60 mL/min   Anion gap 12 5 - 15  CBC     Status: Abnormal   Collection Time: 08/17/21  6:30 AM  Result Value Ref Range   WBC 16.9 (H) 4.0 - 10.5 K/uL   RBC 3.39 (L) 4.22 - 5.81 MIL/uL   Hemoglobin 11.9 (L) 13.0 - 17.0 g/dL   HCT 35.6 (L) 39.0 - 52.0 %    MCV 105.0 (H) 80.0 - 100.0 fL   MCH 35.1 (H) 26.0 - 34.0 pg   MCHC 33.4 30.0 - 36.0 g/dL   RDW 13.7 11.5 - 15.5 %   Platelets 252 150 - 400 K/uL   nRBC 0.0 0.0 - 0.2 %  Troponin I (High Sensitivity)     Status: Abnormal   Collection Time: 08/17/21  6:30 AM  Result Value Ref Range   Troponin I (High Sensitivity) 240 (HH) <18 ng/L  TSH     Status: Abnormal   Collection Time: 08/17/21  6:30 AM  Result Value Ref Range   TSH 0.322 (L) 0.350 - 4.500 uIU/mL  CBG monitoring, ED     Status: Abnormal   Collection Time: 08/17/21  7:47 AM  Result Value Ref Range   Glucose-Capillary 297 (H) 70 - 99 mg/dL  CBG monitoring, ED     Status: Abnormal   Collection Time: 08/17/21 10:05 AM  Result Value Ref Range   Glucose-Capillary 292 (H) 70 - 99 mg/dL   Comment 1 Notify RN    Comment 2 Document in Chart   CBG monitoring, ED     Status: Abnormal   Collection Time: 08/17/21 11:24 AM  Result Value Ref Range   Glucose-Capillary 212 (H) 70 - 99 mg/dL   Comment 1 Notify RN    Comment 2 Document in Chart     I have Reviewed nursing notes, Vitals, and Lab results since pt's last encounter. Pertinent lab results : see above I have ordered test including BMP, CBC, Mg I have reviewed the last note from staff over past 24 hours I have discussed pt's care plan and test results with nursing staff, case manager   LOS: 1 day   Dwyane Dee, MD [facility] 08/17/2021, 1:36 PM

## 2021-08-17 NOTE — Assessment & Plan Note (Signed)
-   Intermittently in RVR - TSH mildly suppressed, 0.322 - FT4 normal 0.97. Total T3 pending at discharge - continue toprol

## 2021-08-17 NOTE — Assessment & Plan Note (Signed)
-   baseline creatinine ~ 1 - patient presents with increase in creat >0.3 mg/dL above baseline, creat increase >1.5x baseline presumed to have occurred within past 7 days PTA - creat 1.44 on admission  -Found to be hypotensive on admission and received fluids with improvement in renal function - BMP in a.m.

## 2021-08-17 NOTE — Assessment & Plan Note (Signed)
-   tachycardia, tachypnea, leukocytosis, pulm source ongoing - continue abx; see CAP

## 2021-08-18 DIAGNOSIS — I4891 Unspecified atrial fibrillation: Secondary | ICD-10-CM

## 2021-08-18 DIAGNOSIS — R911 Solitary pulmonary nodule: Secondary | ICD-10-CM

## 2021-08-18 DIAGNOSIS — J9601 Acute respiratory failure with hypoxia: Secondary | ICD-10-CM | POA: Diagnosis not present

## 2021-08-18 DIAGNOSIS — N179 Acute kidney failure, unspecified: Secondary | ICD-10-CM | POA: Diagnosis not present

## 2021-08-18 DIAGNOSIS — A419 Sepsis, unspecified organism: Secondary | ICD-10-CM | POA: Diagnosis not present

## 2021-08-18 LAB — GLUCOSE, CAPILLARY
Glucose-Capillary: 185 mg/dL — ABNORMAL HIGH (ref 70–99)
Glucose-Capillary: 199 mg/dL — ABNORMAL HIGH (ref 70–99)
Glucose-Capillary: 210 mg/dL — ABNORMAL HIGH (ref 70–99)
Glucose-Capillary: 231 mg/dL — ABNORMAL HIGH (ref 70–99)
Glucose-Capillary: 291 mg/dL — ABNORMAL HIGH (ref 70–99)
Glucose-Capillary: 310 mg/dL — ABNORMAL HIGH (ref 70–99)

## 2021-08-18 LAB — PROCALCITONIN: Procalcitonin: 2.02 ng/mL

## 2021-08-18 MED ORDER — AMOXICILLIN-POT CLAVULANATE 875-125 MG PO TABS: 1.0000 | ORAL_TABLET | Freq: Two times a day (BID) | ORAL | 0 refills | Status: AC

## 2021-08-18 MED ORDER — INSULIN GLARGINE 100 UNIT/ML ~~LOC~~ SOLN: 20.0000 [IU] | Freq: Every day | SUBCUTANEOUS | 5 refills | Status: DC

## 2021-08-18 MED ORDER — DOXYCYCLINE HYCLATE 100 MG PO CAPS: 100.0000 mg | ORAL_CAPSULE | Freq: Two times a day (BID) | ORAL | 0 refills | Status: DC

## 2021-08-18 MED ORDER — ALBUTEROL SULFATE HFA 108 (90 BASE) MCG/ACT IN AERS: 2.0000 | INHALATION_SPRAY | Freq: Four times a day (QID) | RESPIRATORY_TRACT | 2 refills | Status: DC | PRN

## 2021-08-18 MED ORDER — INSULIN ASPART 100 UNIT/ML IJ SOLN: 2.0000 [IU] | Freq: Three times a day (TID) | INTRAMUSCULAR | 1 refills | Status: DC

## 2021-08-18 MED ORDER — INSULIN GLARGINE-YFGN 100 UNIT/ML ~~LOC~~ SOLN
20.0000 [IU] | Freq: Every day | SUBCUTANEOUS | Status: DC
  Administered 2021-08-18: 20 [IU] via SUBCUTANEOUS
  Filled 2021-08-18: qty 0.2

## 2021-08-18 NOTE — Discharge Summary (Signed)
Physician Discharge Summary   Thomas Sandoval. QRF:[id number] DOB: November 04, 1955 DOA: 08/16/2021  PCP: Chevis Pretty, FNP  Admit date: 08/16/2021 Discharge date:  08/18/2021  Admitted From: Home Disposition:  Home Discharging physician: Dwyane Dee, MD  Recommendations for Outpatient Follow-up:  Consider pulmonology referral for LUL nodule May still need repeat CT chest in 4-6 weeks to follow up further PNA resolution Consider repeat TSH in 4-6 weeks. Total T3 pending at time of discharge  Follow up glucose levels; may require further insulin adjustment   Home Health:  Equipment/Devices: Still has O2 at home  Discharge Condition: stable CODE STATUS: Full Diet recommendation:  Diet Orders (From admission, onward)     Start     Ordered   08/18/21 0000  Diet Carb Modified        08/18/21 1635   08/16/21 2109  Diet heart healthy/carb modified Room service appropriate? Yes; Fluid consistency: Thin; Fluid restriction: 1200 mL Fluid  Diet effective now       Question Answer Comment  Diet-HS Snack? Nothing   Room service appropriate? Yes   Fluid consistency: Thin   Fluid restriction: 1200 mL Fluid      08/16/21 2108            Hospital Course: Thomas Sandoval is a 66 yo male with PMH combined systolic/diastolic CHF, COPD, HTN, GERD, a flutter, hard of hearing, PAD, DMII who presented with hypoglycemia.  He also had endorsed feeling more short of breath with mild productive cough.  He was last hospitalized from 07/16/2021 until 07/19/2021 when he left AMA.  He was treated for A-fib with RVR, aspiration pneumonia into the right lung.  He had a significant right-sided pneumonia which has shown continual improvement with repeat imaging since initial treatment.  Initial sputum culture was positive for strep pneumo.  He was started on D10 infusion given blood sugar found to be around 30 on work-up.  He was also started on broad-spectrum antibiotics due to concern for  ongoing/recurrent pneumonia. Glucose levels stabilized and he was resumed back on diabetic diet.  Insulin regimen was adjusted and discussed with patient.  Given A1c of 5.7%, he likely does not require as much insulin administration as previous.  Lantus dose reduced to 20 units and sliding scale also modified.  He met with dietitian prior to discharge as well and was educated on diabetic diet further. He was also discharged with a further course of antibiotics for pneumonia treatment. He will need further follow-up outpatient regarding pulmonary nodule noted in the left upper lobe.  Findings from CT scan were discussed with patient and wife who voiced understanding.  Assessment and Plan: * Sepsis ([place]) - tachycardia, tachypnea, leukocytosis, pulm source ongoing - continue abx; see CAP  CAP (community acquired pneumonia) Further- Imaging reviewed from May 2023 from prior pneumonia presentation and serial imaging has shown continual improvement involving his right sided infiltrates.  He is afebrile on admission however endorsing ongoing productive cough and was found to be tachypneic with hypoxia and leukocytosis. -Reasonable to continue on antibiotics for now, but will de-escalate as soon as able.  Continue current regimen for now - Check procalcitonin (2.23 >> 2.02) - treated with Vanc/zosyn and doxy on admission; transition to Augmentin and doxycycline to complete course at discharge  Acute respiratory failure with hypoxia ([place]) - States he has been on oxygen since initial pneumonia diagnosis.  Suspect he does not need as much oxygen as currently being applied. -Ambulatory walk test performed prior to discharge.  While ambulating on room air oxygen saturations were 95 to 98%.  He did have subjective shortness of breath but no observed true hypoxia -He does have ongoing oxygen at home to continue using if does have some desaturations with exertion  Nodule of left lung - CT chest showed few  scattered pulmonary nodules bilaterally, largest measuring 1.7 cm in the left upper lobe -Patient does have smoking history and no prior CT chest noted - discussed personally the nodule with patient and wife on 6/12 in detail - outpatient follow up to discuss pulm referral in case of biopsy vs repeat imaging or PET  Demand ischemia ([place]) - troponins from admission have trended up; suspect this is demand in setting of respiratory distress on admission - denies CP or other angina equivalents  - EKGs reviewed from admission; repeat EKG on 6/11 showed NSR and no acute ischemia - trop peaked at 240 then downtrended  Atrial fibrillation with RVR ([employer]) - Intermittently in RVR - TSH mildly suppressed, 0.322 - FT4 normal 0.97. Total T3 pending at discharge - continue toprol   AKI (acute kidney injury) ([place]) - baseline creatinine ~ 1 - patient presents with increase in creat >0.3 mg/dL above baseline, creat increase >1.5x baseline presumed to have occurred within past 7 days PTA - creat 1.44 on admission  -Creatinine improved with fluids  Insulin dependent type 2 diabetes mellitus (HCC) - recent A1c 5.7 %  - disconnect between his diet and insulin regimen; he's having hypoglycemic episodes likely b/c on too much insulin as evidenced by A1c, but types of foods he's eating are not compliant with diabetic diet -Evaluated by registered dietitian prior to discharge  - decrease Lantus to 20 units daily - sliding scale modified  - further modification as indicated at follow up  COPD with chronic bronchitis and emphysema ([place]) - Continue home inhalers.  Steroid received on admission - Hold off on further steroids in setting of labile glucose levels  Hypertension associated with diabetes ([place]) - Blood pressure improved after admission and home meds resumed at discharge  Chronic combined systolic and diastolic CHF (congestive heart failure) (HCC) - No edema appreciated on exam and low suspicion  for CHF exacerbation at this time   The patient's chronic medical conditions were treated accordingly per the patient's home medication regimen except as noted.  On day of discharge, patient was felt deemed stable for discharge. Patient/family member advised to call PCP or come back to ER if needed.   Principal Diagnosis: Sepsis [facility])  Discharge Diagnoses: Principal Problem:   Sepsis ([place]) Active Problems:   Acute respiratory failure with hypoxia (HCC)   CAP (community acquired pneumonia)   Demand ischemia ([place])   Nodule of left lung   Atrial fibrillation with RVR (HCC)   AKI (acute kidney injury) ([place])   Insulin dependent type 2 diabetes mellitus (HCC)   COPD with chronic bronchitis and emphysema (HCC)   Hypertension associated with diabetes ([place])   Chronic combined systolic and diastolic CHF (congestive heart failure) ([place])   Discharge Instructions     Diet Carb Modified   Complete by: As directed    Increase activity slowly   Complete by: As directed       Allergies as of 08/18/2021       Reactions   Azithromycin Other (See Comments), Nausea And Vomiting   "Severe stomach cramps; told to list as an allergy by dr. Years ago"   Avelox [moxifloxacin Hcl In Nacl] Other (See Comments)  Hemolysis  In 2012   Bactrim [sulfamethoxazole-trimethoprim] Diarrhea, Nausea And Vomiting        Medication List     TAKE these medications    acetaminophen 500 MG tablet Commonly known as: TYLENOL Take 500 mg by mouth every 6 (six) hours as needed for moderate pain or headache.   albuterol 108 (90 Base) MCG/ACT inhaler Commonly known as: VENTOLIN HFA Inhale 2 puffs into the lungs every 6 (six) hours as needed for wheezing or shortness of breath.   amoxicillin-clavulanate 875-125 MG tablet Commonly known as: AUGMENTIN Take 1 tablet by mouth 2 (two) times daily for 5 days.   B-D INS SYR ULTRAFINE 1CC/30G 30G X 1/2" 1 ML Misc Generic drug: Insulin Syringe-Needle U-100 Use 4  times a day with insulin Dx E11.9   cetirizine 10 MG tablet Commonly known as: ZYRTEC Take 10 mg by mouth daily.   doxycycline 100 MG capsule Commonly known as: VIBRAMYCIN Take 1 capsule (100 mg total) by mouth 2 (two) times daily for 5 days.   empagliflozin 10 MG Tabs tablet Commonly known as: Jardiance Take 1 tablet (10 mg total) by mouth daily before breakfast.   famotidine 20 MG tablet Commonly known as: PEPCID Take 1 tablet (20 mg total) by mouth every morning. Reported on 04/16/2015   furosemide 40 MG tablet Commonly known as: LASIX Take 1 tablet (40 mg total) by mouth daily.   HYDROcodone-acetaminophen 5-325 MG tablet Commonly known as: Norco Take 1 tablet by mouth 2 (two) times daily. What changed: Another medication with the same name was removed. Continue taking this medication, and follow the directions you see here.   insulin aspart 100 UNIT/ML injection Commonly known as: NovoLOG Inject 2-7 Units into the skin 4 (four) times daily - after meals and at bedtime. Glucose: 201 - 250: 2 units, Glucose: 251 - 300: 3 units, Glucose: 301 - 350: 4 units, Glucose: 351 - 400: 5 units, Glucose: 400-450: 7 units, Glucose >450, call MD What changed: See the new instructions.   insulin glargine 100 UNIT/ML injection Commonly known as: Lantus Inject 0.2 mLs (20 Units total) into the skin daily. What changed: See the new instructions.   Insulin Syringes (Disposable) U-100 1 ML Misc Use 4 times a day for insulin injection Dx E11.9   losartan 50 MG tablet Commonly known as: COZAAR Take 1 tablet (50 mg total) by mouth at bedtime. hold this medication until follow up   metoprolol succinate 100 MG 24 hr tablet Commonly known as: TOPROL-XL Take 1 tablet (100 mg total) by mouth daily. Take with or immediately following a meal. What changed: when to take this   OVER THE COUNTER MEDICATION Take 1 Scoop by mouth in the morning and at bedtime. Super Beets   rivaroxaban 20 MG Tabs  tablet Commonly known as: Xarelto Take 1 tablet (20 mg total) by mouth daily with supper. What changed: when to take this   Trelegy Ellipta 100-62.5-25 MCG/ACT Aepb Generic drug: Fluticasone-Umeclidin-Vilant INHALE 1 PUFF INTO THE LUNGS TWICE A DAY What changed:  how much to take how to take this when to take this        Follow-up Information     Chevis Pretty, FNP. Schedule an appointment as soon as possible for a visit in 1 week(s).   Specialty: Family Medicine Why: Will need referral to pulmonolgy to discuss left lung nodule. Consider further insulin adjustment as necessary. Contact information: [address] [place] [address] [phone]  Vickie Epley, MD .   Specialties: Cardiology, Radiology Contact information: [place] [address] [phone]         Donato Heinz, MD .   Specialties: Cardiology, Radiology Contact information: [address] [place] [address] [place] [address] [phone]                Allergies  Allergen Reactions   Azithromycin Other (See Comments) and Nausea And Vomiting    "Severe stomach cramps; told to list as an allergy by dr. Huel Cote ago"   Avelox [Moxifloxacin Hcl In Nacl] Other (See Comments)    Hemolysis  In 2012   Bactrim [Sulfamethoxazole-Trimethoprim] Diarrhea and Nausea And Vomiting    Consultations:   Procedures:   Discharge Exam: BP (!) 151/100 (BP Location: Right Arm)   Pulse 69   Temp 98.5 F (36.9 C) (Oral)   Resp 19   Ht 5' 10" (1.778 m)   Wt 75.5 kg   SpO2 95%   BMI 23.88 kg/m  Physical Exam Constitutional:      General: He is not in acute distress.    Appearance: Normal appearance.     Comments: Hard of hearing   HENT:     Head: Normocephalic and atraumatic.     Mouth/Throat:     Mouth: Mucous membranes are moist.  Eyes:     Extraocular Movements: Extraocular movements intact.  Cardiovascular:     Rate and  Rhythm: Normal rate and regular rhythm.     Heart sounds: Normal heart sounds.  Pulmonary:     Effort: Pulmonary effort is normal.     Comments: Coarse sounds bilaterally, no wheezing  Abdominal:     General: Bowel sounds are normal. There is no distension.     Palpations: Abdomen is soft.     Tenderness: There is no abdominal tenderness.  Musculoskeletal:        General: Normal range of motion.     Cervical back: Normal range of motion and neck supple.  Skin:    General: Skin is warm and dry.  Neurological:     General: No focal deficit present.     Mental Status: He is alert.  Psychiatric:        Mood and Affect: Mood normal.        Behavior: Behavior normal.      The results of significant diagnostics from this hospitalization (including imaging, microbiology, ancillary and laboratory) are listed below for reference.   Microbiology: Recent Results (from the past 240 hour(s))  Culture, blood (Routine x 2)     Status: None (Preliminary result)   Collection Time: 08/16/21  5:23 PM   Specimen: BLOOD RIGHT FOREARM  Result Value Ref Range Status   Specimen Description BLOOD RIGHT FOREARM  Final   Special Requests   Final    BOTTLES DRAWN AEROBIC AND ANAEROBIC Blood Culture adequate volume   Culture   Final    NO GROWTH 2 DAYS Performed at [facility], [address], [place], [place] [address]    Report Status PENDING  Incomplete  Culture, blood (Routine x 2)     Status: None (Preliminary result)   Collection Time: 08/16/21  8:30 PM   Specimen: BLOOD LEFT HAND  Result Value Ref Range Status   Specimen Description BLOOD LEFT HAND  Final   Special Requests   Final    BOTTLES DRAWN AEROBIC AND ANAEROBIC Blood Culture adequate volume   Culture  Final    NO GROWTH 2 DAYS Performed at [facility], [place] [address], [place], [place] [address]    Report Status PENDING  Incomplete     Labs: BNP (last 3 results) Recent Labs    12/17/20 1503 07/16/21 1900  BNP  200.0* 709.6*   Basic Metabolic Panel: Recent Labs  Lab 08/16/21 1723 08/16/21 2037 08/17/21 0630  NA 133* 131* 132*  K 3.7 4.3 4.7  CL 95*  --  96*  CO2 25  --  24  GLUCOSE 60*  --  306*  BUN 14  --  15  CREATININE 1.44*  --  1.27*  CALCIUM 10.4*  --  9.5   Liver Function Tests: Recent Labs  Lab 08/16/21 1723 08/17/21 0630  AST 20 20  ALT 9 11  ALKPHOS 71 56  BILITOT 0.9 1.8*  PROT 7.3 6.2*  ALBUMIN 3.4* 2.8*   No results for input(s): "LIPASE", "AMYLASE" in the last 168 hours. No results for input(s): "AMMONIA" in the last 168 hours. CBC: Recent Labs  Lab 08/16/21 1723 08/16/21 2037 08/17/21 0630  WBC 12.8*  --  16.9*  NEUTROABS 10.6*  --   --   HGB 13.7 14.3 11.9*  HCT 41.7 42.0 35.6*  MCV 106.1*  --  105.0*  PLT 343  --  252   Cardiac Enzymes: No results for input(s): "CKTOTAL", "CKMB", "CKMBINDEX", "TROPONINI" in the last 168 hours. BNP: Invalid input(s): "POCBNP" CBG: Recent Labs  Lab 08/18/21 0304 08/18/21 0553 08/18/21 0813 08/18/21 1145 08/18/21 1523  GLUCAP 185* 231* 291* 310* 199*   D-Dimer No results for input(s): "DDIMER" in the last 72 hours. Hgb A1c No results for input(s): "HGBA1C" in the last 72 hours. Lipid Profile No results for input(s): "CHOL", "HDL", "LDLCALC", "TRIG", "CHOLHDL", "LDLDIRECT" in the last 72 hours. Thyroid function studies Recent Labs    08/17/21 0630  TSH 0.322*   Anemia work up Recent Labs    08/17/21 0745 08/17/21 1519  VITAMINB12 375  --   FOLATE  --  5.9*   Urinalysis    Component Value Date/Time   COLORURINE AMBER (A) 12/02/2020 1445   APPEARANCEUR HAZY (A) 12/02/2020 1445   LABSPEC 1.025 12/02/2020 1445   PHURINE 5.0 12/02/2020 1445   GLUCOSEU >=500 (A) 12/02/2020 1445   HGBUR SMALL (A) 12/02/2020 1445   BILIRUBINUR NEGATIVE 12/02/2020 1445   BILIRUBINUR neg 04/21/2013 1634   KETONESUR 80 (A) 12/02/2020 1445   PROTEINUR 100 (A) 12/02/2020 1445   UROBILINOGEN 1.0 11/25/2014 0348    NITRITE NEGATIVE 12/02/2020 1445   LEUKOCYTESUR NEGATIVE 12/02/2020 1445   Sepsis Labs Recent Labs  Lab 08/16/21 1723 08/17/21 0630  WBC 12.8* 16.9*   Microbiology Recent Results (from the past 240 hour(s))  Culture, blood (Routine x 2)     Status: None (Preliminary result)   Collection Time: 08/16/21  5:23 PM   Specimen: BLOOD RIGHT FOREARM  Result Value Ref Range Status   Specimen Description BLOOD RIGHT FOREARM  Final   Special Requests   Final    BOTTLES DRAWN AEROBIC AND ANAEROBIC Blood Culture adequate volume   Culture   Final    NO GROWTH 2 DAYS Performed at [facility], [place] [address], [place], [place] [address]    Report Status PENDING  Incomplete  Culture, blood (Routine x 2)     Status: None (Preliminary result)   Collection Time: 08/16/21  8:30 PM   Specimen: BLOOD LEFT HAND  Result  Value Ref Range Status   Specimen Description BLOOD LEFT HAND  Final   Special Requests   Final    BOTTLES DRAWN AEROBIC AND ANAEROBIC Blood Culture adequate volume   Culture   Final    NO GROWTH 2 DAYS Performed at [facility], [address] [place], [place] [address]    Report Status PENDING  Incomplete    Procedures/Studies: CT CHEST WO CONTRAST  Result Date: 08/17/2021 CLINICAL DATA:  Pneumonia, complication suspected. EXAM: CT CHEST WITHOUT CONTRAST TECHNIQUE: Multidetector CT imaging of the chest was performed following the standard protocol without IV contrast. RADIATION DOSE REDUCTION: This exam was performed according to the departmental dose-optimization program which includes automated exposure control, adjustment of the mA and/or kV according to patient size and/or use of iterative reconstruction technique. COMPARISON:  08/16/2021. FINDINGS: Cardiovascular: The heart is enlarged and there is no pericardial effusion. Three-vessel coronary artery calcifications are noted. There is atherosclerotic calcification of the aorta without evidence of aneurysm. The  pulmonary trunk is distended suggesting underlying pulmonary artery hypertension. Mediastinum/Nodes: No enlarged mediastinal or axillary lymph nodes by size criteria. Evaluation of the hila is limited due to lack of IV contrast. Thyroid gland, trachea, and esophagus demonstrate no significant findings. Lungs/Pleura: Apical pleural scarring is noted bilaterally. Mild emphysematous changes are noted in the lungs and there is fibrosis in the right upper lobe. Scattered ground-glass and patchy airspace opacities are noted bilaterally, greater on the right than on the left. There is consolidation in the right lower lobe. No effusion or pneumothorax. A few scattered pulmonary nodules are noted bilaterally, the largest in the left upper lobe measuring 1.7 cm, axial image 36. Upper Abdomen: No acute abnormality. Musculoskeletal: No chest wall mass or suspicious bone lesions identified. IMPRESSION: 1. Ground-glass and patchy airspace opacities in the lungs bilaterally and right lower lobe consolidation suspicious for pneumonia. 2. Scattered nodular opacities in the lungs bilaterally measuring up to 1.7 cm in the left upper lobe. Findings may be infectious, inflammatory, or neoplastic. Consider one of the following in 3 months for both low-risk and high-risk individuals: (a) repeat chest CT, (b) follow-up PET-CT, or (c) tissue sampling. This recommendation follows the consensus statement: Guidelines for Management of Incidental Pulmonary Nodules Detected on CT Images: From the Fleischner Society 2017; Radiology 2017; 284:228-243. 3. Cardiomegaly with multi-vessel coronary artery calcifications. 4. Aortic atherosclerosis. Electronically Signed   By: Brett Fairy M.D.   On: 08/17/2021 04:44   DG Chest Portable 1 View  Result Date: 08/16/2021 CLINICAL DATA:  Dyspnea EXAM: PORTABLE CHEST 1 VIEW COMPARISON:  07/26/2021 FINDINGS: Right basilar consolidation appears relatively stable since prior examination suggesting recurrent  infection or aspiration or an incompletely resolved infectious process. No pneumothorax or pleural effusion. Cardiac size within normal limits. Pulmonary vascularity is normal. No acute bone abnormality. IMPRESSION: Persistent consolidation within the right lung base, relatively stable since prior examination suggesting either incomplete resolution or recurrent infection or aspiration. Nonemergent contrast enhanced CT examination may be helpful to exclude the presence of a central obstructing lesion. Electronically Signed   By: Fidela Salisbury M.D.   On: 08/16/2021 18:44   DG Chest Portable 1 View  Result Date: 07/26/2021 CLINICAL DATA:  Cough EXAM: PORTABLE CHEST - 1 VIEW COMPARISON:  07/16/2021 FINDINGS: Cardiomediastinal silhouette and pulmonary vasculature are within normal limits. Significant interval improvement in aeration of the right lung. Left lung remains well aerated. IMPRESSION: Significant interval improvement in aeration of the right lung likely due to resolving pneumonia.  Electronically Signed   By: Miachel Roux M.D.   On: 07/26/2021 10:39     Time coordinating discharge: Over 30 minutes    Dwyane Dee, MD  [facility] 08/18/2021, 4:49 PM

## 2021-08-18 NOTE — TOC Transition Note (Signed)
Transition of Care [facility]) - CM/SW Discharge Note   Patient Details  Name: Thomas Sandoval. MRN: [id number] Date of Birth: 1955/12/15  Transition [facility]) CM/SW Contact:  Bartholomew Crews, RN Phone Number: [phone] 08/18/2021, 4:15 PM   Clinical Narrative:     Notified by MD of patient transition home today. Spouse to provide transportation home. Per nursing ambulation sat on RA 95-98%. Home O2 arranged during previous admission with [facility]. No TOC needs at this time.   Final next level of care: Home/Self Care Barriers to Discharge: No Barriers Identified   Patient Goals and CMS Choice        Discharge Placement                       Discharge Plan and Services                                     Social Determinants of Health (SDOH) Interventions     Readmission Risk Interventions     No data to display

## 2021-08-18 NOTE — Discharge Instructions (Signed)
You may need some oxygen when walking or exerting.  Continue checking oxygen levels and maintain saturations greater than 88%.  Try using albuterol inhaler if feeling short of breath or wheezing  Follow-up with primary care to discuss left lung nodule further  Continue with your insulin and discuss any further adjustments at follow-up

## 2021-08-18 NOTE — Progress Notes (Signed)
Nutrition Brief Note  Received consult from MD regarding diet education for fluctuating blood sugar and hypoglycemia.   Lab Results  Component Value Date   HGBA1C 5.7 (H) 07/17/2021   Pt reports eating smaller more frequent meals throughout the day. He recalls eating eggs, a variety of meats and grains but does not eat a variety of vegetables because he is usually busy and does not have much time to prepare these. He is concerned of hyperglycemia and usually refuses steroids to prevent these episodes from occurring. Discussed with pt to continue checking his blood sugars and administering his insulin as prescribed by MD. To avoid hypoglycemia spoke with him about incorporating snacks more frequently or keeping grab-and-go snacks on hand when he is more busy and less able to prepare meals. His wife reports that when his blood sugar is low and he is symptomatic, he will drink a [facility] first and then have a sandwich. We also discussed keeping sugar tablets/gel on hand for symptoms of low blood sugar.   Body mass index is 23.88 kg/m. Patient meets criteria for normal weight based on current BMI.   Current diet order is heart healthy/carb modified, patient is consuming approximately 100% of meals at this time. Labs and medications reviewed.   No nutrition interventions warranted at this time. If nutrition issues arise, please consult RD.   Clayborne Dana, RDN, LDN Clinical Nutrition

## 2021-08-18 NOTE — Assessment & Plan Note (Signed)
-   CT chest showed few scattered pulmonary nodules bilaterally, largest measuring 1.7 cm in the left upper lobe -Patient does have smoking history and no prior CT chest noted - discussed personally the nodule with patient and wife on 6/12 in detail - outpatient follow up to discuss pulm referral in case of biopsy vs repeat imaging or PET

## 2021-08-19 ENCOUNTER — Telehealth: Payer: Self-pay

## 2021-08-19 DIAGNOSIS — J9601 Acute respiratory failure with hypoxia: Secondary | ICD-10-CM | POA: Diagnosis not present

## 2021-08-19 LAB — T3: T3, Total: 53 ng/dL — ABNORMAL LOW (ref 71–180)

## 2021-08-19 NOTE — Telephone Encounter (Signed)
Transition Care Management Follow-up Telephone Call Date of discharge and from where: 08/18/21 - [facility] - sepsis due to PNA How have you been since you were released from the hospital? He feels much better Any questions or concerns? No - He says his sugar was 200 this am, but he is working on getting it down. Feels fine.  Items Reviewed: Did the pt receive and understand the discharge instructions provided? Yes  Medications obtained and verified? Yes  Other? No  Any new allergies since your discharge? No  Dietary orders reviewed? Yes Do you have support at home? Yes   Home Care and Equipment/Supplies: Were home health services ordered? no  Were any new equipment or medical supplies ordered?  No What is the name of the medical supply agency? He was already on Oxygen   Functional Questionnaire: (I = Independent and D = Dependent) ADLs: I  Bathing/Dressing- I  Meal Prep- I  Eating- I  Maintaining continence- I  Transferring/Ambulation- I  Managing Meds- I  Follow up appointments reviewed:  PCP Hospital f/u appt confirmed? Yes  Scheduled to see Shelah Lewandowsky on 08/21/21 @ 12:15. [facility] f/u appt confirmed? No   Are transportation arrangements needed? No  If their condition worsens, is the pt aware to call PCP or go to the Emergency Dept.? Yes Was the patient provided with contact information for the PCP's office or ED? Yes Was to pt encouraged to call back with questions or concerns? Yes

## 2021-08-20 ENCOUNTER — Ambulatory Visit (INDEPENDENT_AMBULATORY_CARE_PROVIDER_SITE_OTHER): Payer: BC Managed Care – PPO | Admitting: Cardiology

## 2021-08-20 ENCOUNTER — Encounter: Payer: Self-pay | Admitting: Cardiology

## 2021-08-20 VITALS — BP 100/57 | HR 73 | Ht 70.0 in | Wt 170.4 lb

## 2021-08-20 DIAGNOSIS — R600 Localized edema: Secondary | ICD-10-CM | POA: Diagnosis not present

## 2021-08-20 DIAGNOSIS — I5041 Acute combined systolic (congestive) and diastolic (congestive) heart failure: Secondary | ICD-10-CM | POA: Diagnosis not present

## 2021-08-20 DIAGNOSIS — Z79899 Other long term (current) drug therapy: Secondary | ICD-10-CM | POA: Diagnosis not present

## 2021-08-20 DIAGNOSIS — I4819 Other persistent atrial fibrillation: Secondary | ICD-10-CM

## 2021-08-20 DIAGNOSIS — Z720 Tobacco use: Secondary | ICD-10-CM

## 2021-08-20 DIAGNOSIS — I10 Essential (primary) hypertension: Secondary | ICD-10-CM

## 2021-08-20 MED ORDER — METOPROLOL SUCCINATE ER 100 MG PO TB24
100.0000 mg | ORAL_TABLET | Freq: Every day | ORAL | 3 refills | Status: DC
Start: 2021-08-20 — End: 2021-10-30

## 2021-08-20 MED ORDER — FUROSEMIDE 40 MG PO TABS: 40.0000 mg | ORAL_TABLET | Freq: Every day | ORAL | 3 refills | Status: DC | PRN

## 2021-08-20 NOTE — Patient Instructions (Addendum)
Medication Instructions:  Decrease lasix to 40 mg as needed for a weight gain of 3 lbs overnight or 5 lbs in a week.  *If you need a refill on your cardiac medications before your next appointment, please call your pharmacy*   Lab Work: Your physician recommends that you return for lab work (BMP)  If you have labs (blood work) drawn today and your tests are completely normal, you will receive your results only by: [place] (if you have MyChart) OR A paper copy in the mail If you have any lab test that is abnormal or we need to change your treatment, we will call you to review the results.   Testing/Procedures: None ordered    Follow-Up: At [facility], you and your health needs are our priority.  As part of our continuing mission to provide you with exceptional heart care, we have created designated Provider Care Teams.  These Care Teams include your primary Cardiologist (physician) and Advanced Practice Providers (APPs -  Physician Assistants and Nurse Practitioners) who all work together to provide you with the care you need, when you need it.  We recommend signing up for the patient portal called "MyChart".  Sign up information is provided on this After Visit Summary.  MyChart is used to connect with patients for Virtual Visits (Telemedicine).  Patients are able to view lab/test results, encounter notes, upcoming appointments, etc.  Non-urgent messages can be sent to your provider as well.   To learn more about what you can do with MyChart, go to [email or web address].    Your next appointment:   3 month(s)  The format for your next appointment:   In Person  Provider:   Donato Heinz, MD {  Your physician recommends that you schedule a follow-up appointment with the Afib clinic in 2 weeks.

## 2021-08-20 NOTE — Progress Notes (Signed)
Cardiology Office Note:    Date:  08/25/2021   ID:  Thomas Medici., DOB Jan 27, 1956, MRN [id number]  PCP:  Chevis Pretty, FNP  Cardiologist:  Donato Heinz, MD  Electrophysiologist:  Vickie Epley, MD   Referring MD: Chevis Pretty, *   Chief Complaint  Patient presents with   Atrial Fibrillation    History of Present Illness:    Thomas Sandoval. is a 66 y.o. male with a hx of alcohol abuse, tobacco abuse, COPD, perforated gastric ulcer status postrepair in 2016, T2DM, hypertension, PAD, MVP, atrial fibrillation/flutter, systolic heart failure who presents for follow-up.  He was hospitalized September 2022 with acute pancreatitis found to be in A. fib.  Echo at that time showed EF 30 to 35%.  He left AMA.  At follow-up with his PCP 12/17/2020 he was still in A. fib and started on Xarelto and diltiazem.  He was referred to A. fib clinic, seen on 12/25/2020.  He underwent cardioversion on 01/20/2021 with successful restoration of sinus rhythm.  Echocardiogram 08/26/2017 showed EF 55 to 60%, mild mitral valve prolapse.  Echo 12/05/2020 showed EF 30 to 35%, global hypokinesis, and low normal RV function, moderate left atrial enlargement, trivial MR.  Zio patch x3 days on 02/10/2021 showed high percent A. fib burden with average rate 95 bpm.  Echocardiogram 05/21/2021 showed EF 55 to 51%, grade 2 diastolic dysfunction, normal RV function, no significant valvular disease.  Since last clinic visit, he reports that he has been having issues with pneumonia over the last month, requiring 2 hospitalizations.  Most recently discharged on 6/12.  Reports his dyspnea is improving.  Denies any chest pain, lightheadedness, syncope, or palpitations.  Reports had lower extremity edema during prior hospitalization and was started on Lasix 40 mg daily, as previously was taking as needed Lasix.  He continues to smoke 1 pack/day.  Also drinking about 1 alcoholic beverage per  day.  Past Medical History:  Diagnosis Date   Arthritis    Bladder cancer ([place])    Chronic back pain    Chronic combined systolic and diastolic CHF (congestive heart failure) ([place]) 07/16/2021   COPD (chronic obstructive pulmonary disease) (HCC)    DDD (degenerative disc disease)    Diabetic retinopathy of both eyes (HCC)    Essential hypertension    GERD (gastroesophageal reflux disease)    History of atrial flutter 02/2011   Converted to NSR with Cardizem   History of chronic bronchitis    History of hemolytic anemia 02/2011   secondary to Avelox   HOH (hard of hearing)    HOH (hard of hearing)    no eardrum and nerve damage on R, also HOH on L   Mitral valve prolapse    a. 2D Echo 11/27/14: EF 55-60%; images were inadequate for LV wall motion assessment, + mild late systolic mitral valve prolapse involving the anterior leaflet.   PAD (peripheral artery disease) ([place]) 04/2014   Dr Trula Slade; bilateral SFA occlusion, R mid, L distal   PAF (paroxysmal atrial fibrillation) ([place])    a. Dx 11/2014 during admission for perf ulcer.   Perforated ulcer ([place])    a. 11/2014 s/p surgery.   Productive cough    Smokers' cough ([place])    Type 1 diabetes mellitus ([place]) 1977    Past Surgical History:  Procedure Laterality Date   CARDIOVERSION N/A 01/20/2021   Procedure: CARDIOVERSION;  Surgeon: Fay Records, MD;  Location: [facility];  Service: Cardiovascular;  Laterality: N/A;   CYSTOSCOPY WITH URETEROSCOPY Right 08/14/2013   Procedure: CYSTOSCOPY WITH URETEROSCOPY BLADDER BIOPSY ;  Surgeon: Claybon Jabs, MD;  Location: [facility];  Service: Urology;  Laterality: Right;   ESOPHAGOGASTRODUODENOSCOPY N/A 02/06/2013   Procedure: ESOPHAGOGASTRODUODENOSCOPY (EGD);  Surgeon: Lafayette Dragon, MD;  [facility] ENDOSCOPY;  Service: Endoscopy;  Laterality: N/A;   LAPAROSCOPY N/A 11/25/2014   Procedure: LAPAROSCOPIC PRIMARY REPAIR OF PERFORATED PREPYLORIC ULCER WITH Silvestre Gunner;  Surgeon:  Greer Pickerel, MD;  Location: [facility];  Service: General;  Laterality: N/A;   TONSILLECTOMY  as child   TRANSTHORACIC ECHOCARDIOGRAM  02-17-2011   MODERATE LVH/  EF 65%   TRANSURETHRAL RESECTION OF BLADDER TUMOR WITH GYRUS (TURBT-GYRUS) N/A 06/12/2013   Procedure: TRANSURETHRAL RESECTION OF BLADDER TUMOR WITH GYRUS (TURBT-GYRUS);  Surgeon: Claybon Jabs, MD;  Location: [facility];  Service: Urology;  Laterality: N/A;   TYMPANIC MEMBRANE REPAIR  as child    Current Medications: Current Meds  Medication Sig   acetaminophen (TYLENOL) 500 MG tablet Take 500 mg by mouth every 6 (six) hours as needed for moderate pain or headache.   [EXPIRED] amoxicillin-clavulanate (AUGMENTIN) 875-125 MG tablet Take 1 tablet by mouth 2 (two) times daily for 5 days.   cetirizine (ZYRTEC) 10 MG tablet Take 10 mg by mouth daily.   empagliflozin (JARDIANCE) 10 MG TABS tablet Take 1 tablet (10 mg total) by mouth daily before breakfast.   famotidine (PEPCID) 20 MG tablet Take 1 tablet (20 mg total) by mouth every morning. Reported on 04/16/2015   Fluticasone-Umeclidin-Vilant (TRELEGY ELLIPTA) 100-62.5-25 MCG/ACT AEPB INHALE 1 PUFF INTO THE LUNGS TWICE A DAY (Patient taking differently: Inhale 1 puff into the lungs daily. INHALE 1 PUFF INTO THE LUNGS TWICE A DAY)   HYDROcodone-acetaminophen (NORCO) 5-325 MG tablet Take 1 tablet by mouth 2 (two) times daily.   insulin aspart (NOVOLOG) 100 UNIT/ML injection Inject 2-7 Units into the skin 4 (four) times daily - after meals and at bedtime. Glucose: 201 - 250: 2 units, Glucose: 251 - 300: 3 units, Glucose: 301 - 350: 4 units, Glucose: 351 - 400: 5 units, Glucose: 400-450: 7 units, Glucose >450, call MD   insulin glargine (LANTUS) 100 UNIT/ML injection Inject 0.2 mLs (20 Units total) into the skin daily.   Insulin Syringe-Needle U-100 (B-D INS SYR ULTRAFINE 1CC/30G) 30G X 1/2" 1 ML MISC Use 4 times a day with insulin Dx E11.9   Insulin Syringes, Disposable, U-100 1 ML  MISC Use 4 times a day for insulin injection Dx E11.9   losartan (COZAAR) 50 MG tablet Take 1 tablet (50 mg total) by mouth at bedtime. hold this medication until follow up   metoprolol succinate (TOPROL-XL) 100 MG 24 hr tablet Take 1 tablet (100 mg total) by mouth daily. Take with or immediately following a meal.   OVER THE COUNTER MEDICATION Take 1 Scoop by mouth in the morning and at bedtime. Super Beets   rivaroxaban (XARELTO) 20 MG TABS tablet Take 1 tablet (20 mg total) by mouth daily with supper. (Patient taking differently: Take 20 mg by mouth at bedtime.)   [DISCONTINUED] doxycycline (VIBRAMYCIN) 100 MG capsule Take 1 capsule (100 mg total) by mouth 2 (two) times daily for 5 days.   [DISCONTINUED] furosemide (LASIX) 40 MG tablet Take 1 tablet (40 mg total) by mouth daily.   [DISCONTINUED] metoprolol succinate (TOPROL-XL) 100 MG 24 hr tablet Take 1 tablet (100 mg total) by mouth daily. Take with or  immediately following a meal. (Patient taking differently: Take 100 mg by mouth at bedtime. Take with or immediately following a meal.)     Allergies:   Azithromycin, Avelox [moxifloxacin hcl in nacl], and Bactrim [sulfamethoxazole-trimethoprim]   Social History   Socioeconomic History   Marital status: Married    Spouse name: Not on file   Number of children: Not on file   Years of education: Not on file   Highest education level: Not on file  Occupational History   Occupation: Tobacco Farmer  Tobacco Use   Smoking status: Every Day    Packs/day: 2.00    Years: 30.00    Total pack years: 60.00    Types: Cigarettes   Smokeless tobacco: Former    Quit date: 06/08/1978   Tobacco comments:    Couple packs per day 12/25/2020  Vaping Use   Vaping Use: Never used  Substance and Sexual Activity   Alcohol use: Not Currently    Comment: 5 quarts per week   Drug use: No   Sexual activity: Not on file  Other Topics Concern   Not on file  Social History Narrative   Lives in [place] with  wife and 2 sons.    Social Determinants of Health   Financial Resource Strain: Not on file  Food Insecurity: Not on file  Transportation Needs: Not on file  Physical Activity: Not on file  Stress: Not on file  Social Connections: Not on file     Family History: The patient's family history includes Breast cancer in his mother; Cancer in his mother; Diabetes in his son; Heart attack in his father; Heart disease in his father; Rheumatic fever in his father.  ROS:   Please see the history of present illness.     All other systems reviewed and are negative.  EKGs/Labs/Other Studies Reviewed:    The following studies were reviewed today:   EKG:   08/20/2021: Atrial flutter with variable conduction, rate 87 05/20/21: Normal sinus rhythm, rate 61, left axis deviation, nonspecific T wave flattening, Q waves in leads III, aVF 03/14/21:NSR, ST depression<1 mm in inferior leads and V4-6, rate 61  Recent Labs: 04/09/2021: Magnesium 2.0 07/16/2021: B Natriuretic Peptide 950.1 08/17/2021: ALT 11; Hemoglobin 11.9; Platelets 252; TSH 0.322 08/21/2021: BUN 16; Creatinine, Ser 1.12; Potassium 4.1; Sodium 132  Recent Lipid Panel    Component Value Date/Time   CHOL 121 10/25/2020 1442   TRIG 143 10/25/2020 1442   HDL 54 10/25/2020 1442   CHOLHDL 2.2 10/25/2020 1442   LDLCALC 43 10/25/2020 1442    Physical Exam:    VS:  BP (!) 100/57 (BP Location: Left Arm, Patient Position: Sitting, Cuff Size: Normal)   Pulse 73   Ht '5\' 10"'$  (1.778 m)   Wt 170 lb 6.4 oz (77.3 kg)   SpO2 95%   BMI 24.45 kg/m     Wt Readings from Last 3 Encounters:  08/21/21 171 lb (77.6 kg)  08/20/21 170 lb 6.4 oz (77.3 kg)  08/17/21 166 lb 7.2 oz (75.5 kg)     GEN:  Well nourished, well developed in no acute distress HEENT: Normal NECK: No JVD; No carotid bruits LYMPHATICS: No lymphadenopathy CARDIAC: Irregular, tachycardic, no murmurs, rubs, gallops RESPIRATORY:  Clear to auscultation without rales, wheezing or  rhonchi  ABDOMEN: Soft, non-tender, non-distended MUSCULOSKELETAL:  No edema; No deformity  SKIN: Warm and dry NEUROLOGIC:  Alert and oriented x 3 PSYCHIATRIC:  Normal affect   ASSESSMENT:    1.  Persistent atrial fibrillation ([place])   2. Mild peripheral edema   3. Medication management   4. Acute combined systolic and diastolic heart failure ([place])   5. Tobacco use   6. Essential hypertension     PLAN:    Persistent atrial fibrillation/flutter: CHA2DS2-VASc score 4.  Underwent successful cardioversion on 01/20/2021, but was back in A. fib at follow-up appointment on 01/24/2021.  Zio patch x3 days on 02/10/2021 showed 100% percent A. fib burden with average rate 95 bpm.  Subsequently converted to sinus rhythm, but now in aflutter in clinic today -Continue Xarelto 20 mg daily -Continue Toprol-XL 100 mg daily -Suspect current atrial flutter driven by recent illness.  Recommend follow-up in A-fib clinic in 2 weeks, if remains in atrial flutter, would plan cardioversion -Given possible tachycardia induced cardiomyopathy, would recommend rhythm control strategy.  Referred to EP to consider ablation versus antiarrhythmic.  He was seen by Dr. Quentin Ore 03/2021, recommended ablation if has recurrent A-fib.  Given recurrence, will discuss ablation with Dr. Quentin Ore  Acute combined systolic and diastolic heart failure: EF 30 to 35% on echocardiogram during admission with acute pancreatitis in September 2022.  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  Echocardiogram 05/21/2021 showed EF 55 to  -On Lasix 40 mg daily.  Appears euvolemic and BP has been soft, would recommend changing to as needed if gains more than 3 pounds in 1 day or 5 pounds 1 week -Continue Toprol-XL 100 mg daily -Continue losartan 50 mg daily -Continue Jardiance 10 mg daily  Alcohol abuse: Counseled risk of alcohol abuse and cessation recommended.  He initially stopped drinking after admission in September 2022, but reports he has  been drinking recently.  Cessation recommended.  Tobacco use: Patient counseled on the risk of tobacco use and cessation strongly encouraged  Hypertension: Continue Toprol-XL, losartan  T2DM: On insulin   RTC in 3 months   Medication Adjustments/Labs and Tests Ordered: Current medicines are reviewed at length with the patient today.  Concerns regarding medicines are outlined above.  Orders Placed This Encounter  Procedures   Basic metabolic panel   EKG 36-UYQI    Meds ordered this encounter  Medications   furosemide (LASIX) 40 MG tablet    Sig: Take 1 tablet (40 mg total) by mouth daily as needed (weight gain of 3 lbs overnight or 5 lbs in a week).    Dispense:  30 tablet    Refill:  3   metoprolol succinate (TOPROL-XL) 100 MG 24 hr tablet    Sig: Take 1 tablet (100 mg total) by mouth daily. Take with or immediately following a meal.    Dispense:  90 tablet    Refill:  3    Patient Instructions  Medication Instructions:  Decrease lasix to 40 mg as needed for a weight gain of 3 lbs overnight or 5 lbs in a week.  *If you need a refill on your cardiac medications before your next appointment, please call your pharmacy*   Lab Work: Your physician recommends that you return for lab work (BMP)  If you have labs (blood work) drawn today and your tests are completely normal, you will receive your results only by: [place] (if you have MyChart) OR A paper copy in the mail If you have any lab test that is abnormal or we need to change your treatment, we will call you to review the results.   Testing/Procedures: None ordered    Follow-Up: At [facility], you and your health needs are our priority.  As part of our continuing mission to provide you with exceptional heart care, we have created designated Provider Care Teams.  These Care Teams include your primary Cardiologist (physician) and Advanced Practice Providers (APPs -  Physician Assistants and Nurse  Practitioners) who all work together to provide you with the care you need, when you need it.  We recommend signing up for the patient portal called "MyChart".  Sign up information is provided on this After Visit Summary.  MyChart is used to connect with patients for Virtual Visits (Telemedicine).  Patients are able to view lab/test results, encounter notes, upcoming appointments, etc.  Non-urgent messages can be sent to your provider as well.   To learn more about what you can do with MyChart, go to [email or web address].    Your next appointment:   3 month(s)  The format for your next appointment:   In Person  Provider:   Donato Heinz, MD {  Your physician recommends that you schedule a follow-up appointment with the Afib clinic in 2 weeks.        Signed, Donato Heinz, MD  08/25/2021 3:17 PM    [facility]

## 2021-08-21 ENCOUNTER — Encounter: Payer: Self-pay | Admitting: Nurse Practitioner

## 2021-08-21 ENCOUNTER — Telehealth: Payer: Self-pay | Admitting: Nurse Practitioner

## 2021-08-21 ENCOUNTER — Ambulatory Visit: Payer: BC Managed Care – PPO | Admitting: Nurse Practitioner

## 2021-08-21 VITALS — BP 124/71 | HR 99 | Temp 97.1°F | Resp 20 | Ht 70.0 in | Wt 171.0 lb

## 2021-08-21 DIAGNOSIS — I480 Paroxysmal atrial fibrillation: Secondary | ICD-10-CM

## 2021-08-21 DIAGNOSIS — Z79899 Other long term (current) drug therapy: Secondary | ICD-10-CM | POA: Diagnosis not present

## 2021-08-21 DIAGNOSIS — I483 Typical atrial flutter: Secondary | ICD-10-CM

## 2021-08-21 DIAGNOSIS — J189 Pneumonia, unspecified organism: Secondary | ICD-10-CM | POA: Diagnosis not present

## 2021-08-21 DIAGNOSIS — Z7689 Persons encountering health services in other specified circumstances: Secondary | ICD-10-CM

## 2021-08-21 LAB — CULTURE, BLOOD (ROUTINE X 2)
Culture: NO GROWTH
Culture: NO GROWTH
Special Requests: ADEQUATE
Special Requests: ADEQUATE

## 2021-08-21 MED ORDER — DOXYCYCLINE HYCLATE 100 MG PO CAPS: 100.0000 mg | ORAL_CAPSULE | Freq: Two times a day (BID) | ORAL | 0 refills | Status: AC

## 2021-08-21 NOTE — Telephone Encounter (Signed)
That needs to be discussed at his next appointment

## 2021-08-21 NOTE — Telephone Encounter (Signed)
Patient says that you previously told him if he stopped drinking then you would increase his pain meds. He wants you to know that he has stopped and would you increase?

## 2021-08-21 NOTE — Progress Notes (Signed)
Subjective:    Patient ID: Thomas Sandoval., male    DOB: 20-Sep-1955, 66 y.o.   MRN: [id number]  Today's visit was for Transitional Care Management.  The patient was discharged from [facility] on 08/18/21 with a primary diagnosis of sepsis due to pneumonia.   Contact with the patient and/or caregiver, by a clinical staff member, was made on 08/19/21 and was documented as a telephone encounter within the EMR.  Through chart review and discussion with the patient I have determined that management of their condition is of high complexity.    Patient was in hospital for several days with pneumonia and sepsis. He denies sob but still has cough. He saw cardiology yesterday for persistent atrial fib. He has been cardioverted in the past. At cardiology office he was in atrial flutter. He was told to continue xeralto. He was referred to EP to consider ablation.      Review of Systems  Constitutional:  Positive for fatigue.  Respiratory:  Positive for cough. Negative for shortness of breath and wheezing.   Cardiovascular:  Positive for palpitations (occasional). Negative for chest pain and leg swelling.       Objective:   Physical Exam Vitals and nursing note reviewed.  Constitutional:      Appearance: Normal appearance. He is well-developed.  Neck:     Thyroid: No thyroid mass or thyromegaly.     Vascular: No carotid bruit or JVD.     Trachea: Phonation normal.  Cardiovascular:     Rate and Rhythm: Normal rate and regular rhythm.  Pulmonary:     Effort: Pulmonary effort is normal. No respiratory distress.     Breath sounds: Rales (bil bases) present.     Comments: Diminished breath sounds bil bases Abdominal:     General: Bowel sounds are normal.     Palpations: Abdomen is soft.     Tenderness: There is no abdominal tenderness.  Musculoskeletal:        General: Normal range of motion.     Cervical back: Normal range of motion and neck supple.  Lymphadenopathy:      Cervical: No cervical adenopathy.  Skin:    General: Skin is warm and dry.  Neurological:     Mental Status: He is alert and oriented to person, place, and time.  Psychiatric:        Behavior: Behavior normal.        Thought Content: Thought content normal.        Judgment: Judgment normal.    BP 124/71   Pulse 99   Temp (!) 97.1 F (36.2 C) (Temporal)   Resp 20   Ht '5\' 10"'$  (1.778 m)   Wt 171 lb (77.6 kg)   SpO2 92%   BMI 24.54 kg/m          Assessment & Plan:   Thomas Sandoval. in today with chief complaint of Transitions Of Care   1. Encounter for support and coordination of transition of care Hospital records reviewed  2. Paroxysmal atrial fibrillation (HCC) 3. Typical atrial flutter ([place]) Keep appointment with atrial fib clinic Avoid caffiene  4. Pneumonia of both lower lobes due to infectious organism Will repeat chest xray in 5 weeks Finish all antibiotics RTO prn    The above assessment and management plan was discussed with the patient. The patient verbalized understanding of and has agreed to the management plan. Patient is aware to call the clinic if symptoms persist or worsen.  Patient is aware when to return to the clinic for a follow-up visit. Patient educated on when it is appropriate to go to the emergency department.   [name] Hassell Done, FNP

## 2021-08-22 LAB — BASIC METABOLIC PANEL
BUN/Creatinine Ratio: 14 (ref 10–24)
BUN: 16 mg/dL (ref 8–27)
CO2: 20 mmol/L (ref 20–29)
Calcium: 8.8 mg/dL (ref 8.6–10.2)
Chloride: 95 mmol/L — ABNORMAL LOW (ref 96–106)
Creatinine, Ser: 1.12 mg/dL (ref 0.76–1.27)
Glucose: 136 mg/dL — ABNORMAL HIGH (ref 70–99)
Potassium: 4.1 mmol/L (ref 3.5–5.2)
Sodium: 132 mmol/L — ABNORMAL LOW (ref 134–144)
eGFR: 74 mL/min/{1.73_m2} (ref >59)

## 2021-09-04 ENCOUNTER — Ambulatory Visit: Payer: BC Managed Care – PPO | Admitting: Nurse Practitioner

## 2021-09-04 ENCOUNTER — Encounter: Payer: Self-pay | Admitting: Nurse Practitioner

## 2021-09-04 VITALS — BP 109/65 | HR 69 | Temp 97.2°F | Resp 20 | Ht 70.0 in | Wt 170.0 lb

## 2021-09-04 DIAGNOSIS — H6123 Impacted cerumen, bilateral: Secondary | ICD-10-CM | POA: Diagnosis not present

## 2021-09-04 DIAGNOSIS — G8929 Other chronic pain: Secondary | ICD-10-CM | POA: Diagnosis not present

## 2021-09-04 DIAGNOSIS — M545 Low back pain, unspecified: Secondary | ICD-10-CM

## 2021-09-04 NOTE — Progress Notes (Signed)
   Subjective:    Patient ID: Thomas Sandoval., male    DOB: 20-Sep-1955, 66 y.o.   MRN: [id number]   Chief Complaint: Nasal Congestion and ear draining (Right ear/)   HPI Patient comes in to discuss several things: - nasal congestion and right ear drainage. Has ear pain occasionally. He is on zyrtec at home. - he wants his pain medication increased. He has chronic back pain. He says he is no longer drinking alcohol at all. Rates back pain 8/10 most of the time.   Review of Systems  Constitutional:  Negative for diaphoresis.  HENT:  Positive for congestion, ear discharge (right) and ear pain (right).   Eyes:  Negative for pain.  Respiratory:  Negative for shortness of breath.   Cardiovascular:  Negative for chest pain, palpitations and leg swelling.  Gastrointestinal:  Negative for abdominal pain.  Endocrine: Negative for polydipsia.  Skin:  Negative for rash.  Neurological:  Negative for dizziness, weakness and headaches.  Hematological:  Does not bruise/bleed easily.  All other systems reviewed and are negative.      Objective:   Physical Exam Vitals reviewed.  Constitutional:      Appearance: Normal appearance.  HENT:     Right Ear: There is impacted cerumen.     Left Ear: There is impacted cerumen.  Cardiovascular:     Rate and Rhythm: Normal rate and regular rhythm.     Heart sounds: Normal heart sounds.  Pulmonary:     Effort: Pulmonary effort is normal.     Breath sounds: Wheezing present.  Skin:    General: Skin is warm.  Neurological:     General: No focal deficit present.     Mental Status: He is alert and oriented to person, place, and time.  Psychiatric:        Mood and Affect: Mood normal.        Behavior: Behavior normal.    BP 109/65   Pulse 69   Temp (!) 97.2 F (36.2 C) (Temporal)   Resp 20   Ht '5\' 10"'$  (1.778 m)   Wt 170 lb (77.1 kg)   SpO2 93%   BMI 24.39 kg/m    S/P bil ear irrigation- TM"S clear with scarring on right ear  drum.     Assessment & Plan:  Thomas Sandoval. in today with chief complaint of Nasal Congestion and ear draining (Right ear/)   1. Bilateral impacted cerumen Debrox several times a week Do not use qtips in ears  2. Chronic midline low back pain without sciatica Continue current pain meds at current dose - Ambulatory referral to Pain Clinic    The above assessment and management plan was discussed with the patient. The patient verbalized understanding of and has agreed to the management plan. Patient is aware to call the clinic if symptoms persist or worsen. Patient is aware when to return to the clinic for a follow-up visit. Patient educated on when it is appropriate to go to the emergency department.   [name] Hassell Done, FNP

## 2021-09-04 NOTE — Patient Instructions (Signed)

## 2021-09-08 ENCOUNTER — Ambulatory Visit (HOSPITAL_COMMUNITY)
Admission: RE | Admit: 2021-09-08 | Discharge: 2021-09-08 | Disposition: A | Discharge status: Home / Self Care | Payer: BC Managed Care – PPO | Source: Ambulatory Visit | Attending: Physician Assistant | Admitting: Physician Assistant

## 2021-09-08 ENCOUNTER — Encounter (HOSPITAL_COMMUNITY): Payer: Self-pay | Admitting: Physician Assistant

## 2021-09-08 VITALS — BP 118/62 | HR 63 | Ht 70.0 in | Wt 171.8 lb

## 2021-09-08 DIAGNOSIS — I4892 Unspecified atrial flutter: Secondary | ICD-10-CM | POA: Insufficient documentation

## 2021-09-08 DIAGNOSIS — I5022 Chronic systolic (congestive) heart failure: Secondary | ICD-10-CM | POA: Insufficient documentation

## 2021-09-08 DIAGNOSIS — I110 Hypertensive heart disease with heart failure: Secondary | ICD-10-CM | POA: Insufficient documentation

## 2021-09-08 DIAGNOSIS — D6869 Other thrombophilia: Secondary | ICD-10-CM

## 2021-09-08 DIAGNOSIS — E109 Type 1 diabetes mellitus without complications: Secondary | ICD-10-CM | POA: Diagnosis not present

## 2021-09-08 DIAGNOSIS — I4819 Other persistent atrial fibrillation: Secondary | ICD-10-CM

## 2021-09-08 DIAGNOSIS — J449 Chronic obstructive pulmonary disease, unspecified: Secondary | ICD-10-CM | POA: Insufficient documentation

## 2021-09-08 DIAGNOSIS — I739 Peripheral vascular disease, unspecified: Secondary | ICD-10-CM | POA: Insufficient documentation

## 2021-09-08 DIAGNOSIS — I341 Nonrheumatic mitral (valve) prolapse: Secondary | ICD-10-CM | POA: Insufficient documentation

## 2021-09-08 DIAGNOSIS — Z79899 Other long term (current) drug therapy: Secondary | ICD-10-CM | POA: Diagnosis not present

## 2021-09-08 DIAGNOSIS — Z7901 Long term (current) use of anticoagulants: Secondary | ICD-10-CM | POA: Insufficient documentation

## 2021-09-08 NOTE — Progress Notes (Signed)
Primary Care Physician: Chevis Pretty, FNP Primary Cardiologist: Dr Gardiner Rhyme Primary Electrophysiologist: Dr Quentin Ore Referring Physician: Darla Lesches NP   Thomas Sandoval. is a 66 y.o. male with a history of alcohol abuse, tobacco abuse, COPD, MVP, perforated gastric ulcer s/p repair 2016, DM, HTN, PAD (bilateral fem-pop occlusion), GERD, systolic CHF, atrial flutter, atrial fibrillation who presents for follow up in the [facility]. The patient was initially diagnosed with atrial flutter in 2012 and afib in the setting of his perforated ulcer. Patient has a CHADS2VASC score of 4, has declined anticoagulation in the past. He was hospitalized 9/26-9/30/22 with acute alcoholic pancreatitis and was also found to be in afib at the time. Echo showed EF 30-35%. He left the hospital AMA. At his visit with his PCP 12/17/20 he was still in afib and was started on diltiazem and Xarelto. Patient is s/p DCCV on 01/20/21 but unfortunately was back in afib by follow up on 01/24/21. He did not feel any different in SR.   Patient seen by Dr Quentin Ore 03/26/21 and discussed ablation but patient deferred at that time. He was admitted 07/2021 for community acquired pneumonia and rapid atrial flutter. He remained in atrial flutter at his visit with Dr Gardiner Rhyme on 08/20/21.  On follow up today, patient has converted back to SR. He is still fatigued but his breathing better today. No bleeding issues on anticoagulation.   Today, he denies symptoms of palpitations, chest pain, orthopnea, PND, lower extremity edema, dizziness, presyncope, syncope, snoring, daytime somnolence, bleeding, or neurologic sequela. The patient is tolerating medications without difficulties and is otherwise without complaint today.    Atrial Fibrillation Risk Factors:  he does not have symptoms or diagnosis of sleep apnea. he does not have a history of rheumatic fever. he does have a history of alcohol  use. The patient does not have a history of early familial atrial fibrillation or other arrhythmias.  he has a BMI of Body mass index is 24.65 kg/m.Marland Kitchen Filed Weights   09/08/21 1409  Weight: 77.9 kg    Family History  Problem Relation Age of Onset   Breast cancer Mother    Cancer Mother        Breast   Rheumatic fever Father    Heart disease Father    Heart attack Father        Massive    Diabetes Son      Atrial Fibrillation Management history:  Previous antiarrhythmic drugs: none Previous cardioversions: 08/25/17, 01/20/21 Previous ablations: none CHADS2VASC score: 4 Anticoagulation history: Xarelto    Past Medical History:  Diagnosis Date   Arthritis    Bladder cancer ([place])    Chronic back pain    Chronic combined systolic and diastolic CHF (congestive heart failure) ([place]) 07/16/2021   COPD (chronic obstructive pulmonary disease) (H[facility])    DDD (degenerative disc disease)    Diabetic retinopathy of both eyes ([place])    Essential hypertension    GERD (gastroesophageal reflux disease)    History of atrial flutter 02/2011   Converted to NSR with Cardizem   History of chronic bronchitis    History of hemolytic anemia 02/2011   secondary to Avelox   HOH (hard of hearing)    HOH (hard of hearing)    no eardrum and nerve damage on R, also HOH on L   Mitral valve prolapse    a. 2D Echo 11/27/14: EF 55-60%; images were inadequate for LV wall motion assessment, + mild  late systolic mitral valve prolapse involving the anterior leaflet.   PAD (peripheral artery disease) ([place]) 04/2014   Dr Trula Slade; bilateral SFA occlusion, R mid, L distal   PAF (paroxysmal atrial fibrillation) ([place])    a. Dx 11/2014 during admission for perf ulcer.   Perforated ulcer ([place])    a. 11/2014 s/p surgery.   Productive cough    Smokers' cough ([place])    Type 1 diabetes mellitus ([place]) 1977   Past Surgical History:  Procedure Laterality Date   CARDIOVERSION N/A 01/20/2021   Procedure: CARDIOVERSION;   Surgeon: Fay Records, MD;  Location: [facility];  Service: Cardiovascular;  Laterality: N/A;   CYSTOSCOPY WITH URETEROSCOPY Right 08/14/2013   Procedure: CYSTOSCOPY WITH URETEROSCOPY BLADDER BIOPSY ;  Surgeon: Claybon Jabs, MD;  [facility];  Service: Urology;  Laterality: Right;   ESOPHAGOGASTRODUODENOSCOPY N/A 02/06/2013   Procedure: ESOPHAGOGASTRODUODENOSCOPY (EGD);  Surgeon: Lafayette Dragon, MD;  Location[facility] ENDOSCOPY;  Service: Endoscopy;  Laterality: N/A;   LAPAROSCOPY N/A 11/25/2014   Procedure: LAPAROSCOPIC PRIMARY REPAIR OF PERFORATED PREPYLORIC ULCER WITH Silvestre Gunner;  Surgeon: Greer Pickerel, MD;  Location: [facility];  Service: General;  Laterality: N/A;   TONSILLECTOMY  as child   TRANSTHORACIC ECHOCARDIOGRAM  02-17-2011   MODERATE LVH/  EF 65%   TRANSURETHRAL RESECTION OF BLADDER TUMOR WITH GYRUS (TURBT-GYRUS) N/A 06/12/2013   Procedure: TRANSURETHRAL RESECTION OF BLADDER TUMOR WITH GYRUS (TURBT-GYRUS);  Surgeon: Claybon Jabs, MD;  Location: [facility];  Service: Urology;  Laterality: N/A;   TYMPANIC MEMBRANE REPAIR  as child    Current Outpatient Medications  Medication Sig Dispense Refill   acetaminophen (TYLENOL) 500 MG tablet Take 500 mg by mouth every 6 (six) hours as needed for moderate pain or headache.     albuterol (VENTOLIN HFA) 108 (90 Base) MCG/ACT inhaler Inhale 2 puffs into the lungs every 6 (six) hours as needed for wheezing or shortness of breath. 18 g 2   cetirizine (ZYRTEC) 10 MG tablet Take 10 mg by mouth daily.     empagliflozin (JARDIANCE) 10 MG TABS tablet Take 1 tablet (10 mg total) by mouth daily before breakfast. 30 tablet 11   famotidine (PEPCID) 20 MG tablet Take 1 tablet (20 mg total) by mouth every morning. Reported on 04/16/2015 30 tablet 5   Fluticasone-Umeclidin-Vilant (TRELEGY ELLIPTA) 100-62.5-25 MCG/ACT AEPB INHALE 1 PUFF INTO THE LUNGS TWICE A DAY (Patient taking differently: Inhale 1 puff into the lungs daily.  INHALE 1 PUFF INTO THE LUNGS TWICE A DAY) 60 each 5   furosemide (LASIX) 40 MG tablet Take 1 tablet (40 mg total) by mouth daily as needed (weight gain of 3 lbs overnight or 5 lbs in a week). 30 tablet 3   insulin aspart (NOVOLOG) 100 UNIT/ML injection Inject 2-7 Units into the skin 4 (four) times daily - after meals and at bedtime. Glucose: 201 - 250: 2 units, Glucose: 251 - 300: 3 units, Glucose: 301 - 350: 4 units, Glucose: 351 - 400: 5 units, Glucose: 400-450: 7 units, Glucose >450, call MD 10 mL 1   insulin glargine (LANTUS) 100 UNIT/ML injection Inject 0.2 mLs (20 Units total) into the skin daily. 30 mL 5   Insulin Syringe-Needle U-100 (B-D INS SYR ULTRAFINE 1CC/30G) 30G X 1/2" 1 ML MISC Use 4 times a day with insulin Dx E11.9 400 each 3   Insulin Syringes, Disposable, U-100 1 ML MISC Use 4 times a day for insulin injection Dx E11.9  400 each 5   losartan (COZAAR) 50 MG tablet Take 1 tablet (50 mg total) by mouth at bedtime. hold this medication until follow up     metoprolol succinate (TOPROL-XL) 100 MG 24 hr tablet Take 1 tablet (100 mg total) by mouth daily. Take with or immediately following a meal. 90 tablet 3   OVER THE COUNTER MEDICATION Take 1 Scoop by mouth in the morning and at bedtime. Super Beets     rivaroxaban (XARELTO) 20 MG TABS tablet Take 1 tablet (20 mg total) by mouth daily with supper. 30 tablet 5   No current facility-administered medications for this encounter.    Allergies  Allergen Reactions   Azithromycin Other (See Comments) and Nausea And Vomiting    "Severe stomach cramps; told to list as an allergy by dr. Huel Cote ago"   Avelox [Moxifloxacin Hcl In Nacl] Other (See Comments)    Hemolysis  In 2012   Bactrim [Sulfamethoxazole-Trimethoprim] Diarrhea and Nausea And Vomiting    Social History   Socioeconomic History   Marital status: Married    Spouse name: Not on file   Number of children: Not on file   Years of education: Not on file   Highest education  level: Not on file  Occupational History   Occupation: Tobacco Farmer  Tobacco Use   Smoking status: Every Day    Packs/day: 2.00    Years: 30.00    Total pack years: 60.00    Types: Cigarettes   Smokeless tobacco: Former    Quit date: 06/08/1978   Tobacco comments:    Couple packs per day 12/25/2020  Vaping Use   Vaping Use: Never used  Substance and Sexual Activity   Alcohol use: Not Currently    Comment: 5 quarts per week   Drug use: No   Sexual activity: Not on file  Other Topics Concern   Not on file  Social History Narrative   Lives in [place] with wife and 2 sons.    Social Determinants of Health   Financial Resource Strain: Not on file  Food Insecurity: Not on file  Transportation Needs: Not on file  Physical Activity: Not on file  Stress: Not on file  Social Connections: Not on file  Intimate Partner Violence: Not on file     ROS- All systems are reviewed and negative except as per the HPI above.  Physical Exam: Vitals:   09/08/21 1409  BP: 118/62  Pulse: 63  Weight: 77.9 kg  Height: '5\' 10"'$  (1.778 m)     GEN- The patient is a well appearing male, alert and oriented x 3 today.   HEENT-head normocephalic, atraumatic, sclera clear, conjunctiva pink, hearing intact, trachea midline. Lungs- Clear to ausculation bilaterally, normal work of breathing Heart- Regular rate and rhythm, no murmurs, rubs or gallops  GI- soft, NT, ND, + BS Extremities- no clubbing, cyanosis, or edema MS- no significant deformity or atrophy Skin- no rash or lesion Psych- euthymic mood, full affect Neuro- strength and sensation are intact   Wt Readings from Last 3 Encounters:  09/08/21 77.9 kg  09/04/21 77.1 kg  08/21/21 77.6 kg    EKG today demonstrates  SR Vent. rate 63 BPM PR interval 160 ms QRS duration 86 ms QT/QTcB 424/433 ms  Echo 05/21/21 demonstrated   1. Left ventricular ejection fraction, by estimation, is 55 to 60%. The  left ventricle has normal function.  The left ventricle has no regional  wall motion abnormalities. Left ventricular diastolic parameters are  consistent with Grade II diastolic dysfunction (pseudonormalization). Elevated left ventricular end-diastolic pressure.   2. Right ventricular systolic function is normal. The right ventricular  size is normal. There is normal pulmonary artery systolic pressure.   3. The mitral valve is normal in structure. No evidence of mitral valve  regurgitation. No evidence of mitral stenosis.   4. The aortic valve is tricuspid. Aortic valve regurgitation is not  visualized. No aortic stenosis is present.   5. The inferior vena cava is normal in size with greater than 50%  respiratory variability, suggesting right atrial pressure of 3 mmHg.   Epic records are reviewed at length today  CHA2DS2-VASc Score = 4  The patient's score is based upon: CHF History: 1 HTN History: 1 Diabetes History: 1 Stroke History: 0 Vascular Disease History: 1 Age Score: 0 Gender Score: 0       ASSESSMENT AND PLAN: 1. Persistent Atrial Fibrillation/atrial flutter The patient's CHA2DS2-VASc score is 4, indicating a 4.8% annual risk of stroke.   Patient has converted back to SR. Suspect related to recent pneumonia.  Continue Toprol 100 mg daily Continue Xarelto 20 mg daily  2. Secondary Hypercoagulable State (ICD10:  D68.69) The patient is at significant risk for stroke/thromboembolism based upon his CHA2DS2-VASc Score of 4.  Continue Rivaroxaban (Xarelto).   3. HFrEF EF recovered to 55-60% Appears euvolemic today. Rhythm control strategy indicated.   4. HTN Stable, no changes today.   Follow up with Dr Quentin Ore as scheduled.    [facility] [address] [place], [place] [address] [phone] 09/08/2021 2:15 PM

## 2021-09-16 ENCOUNTER — Other Ambulatory Visit: Payer: Self-pay | Admitting: Nurse Practitioner

## 2021-09-16 DIAGNOSIS — Z794 Long term (current) use of insulin: Secondary | ICD-10-CM

## 2021-09-18 DIAGNOSIS — J9601 Acute respiratory failure with hypoxia: Secondary | ICD-10-CM | POA: Diagnosis not present

## 2021-09-19 ENCOUNTER — Encounter: Payer: Self-pay | Admitting: Physical Medicine and Rehabilitation

## 2021-09-25 ENCOUNTER — Ambulatory Visit (INDEPENDENT_AMBULATORY_CARE_PROVIDER_SITE_OTHER): Payer: BC Managed Care – PPO

## 2021-09-25 ENCOUNTER — Ambulatory Visit: Payer: BC Managed Care – PPO | Admitting: Family Medicine

## 2021-09-25 ENCOUNTER — Encounter: Payer: Self-pay | Admitting: Family Medicine

## 2021-09-25 VITALS — BP 120/53 | HR 76 | Temp 97.1°F | Ht 70.0 in

## 2021-09-25 DIAGNOSIS — J449 Chronic obstructive pulmonary disease, unspecified: Secondary | ICD-10-CM

## 2021-09-25 DIAGNOSIS — R946 Abnormal results of thyroid function studies: Secondary | ICD-10-CM | POA: Diagnosis not present

## 2021-09-25 DIAGNOSIS — J189 Pneumonia, unspecified organism: Secondary | ICD-10-CM | POA: Diagnosis not present

## 2021-09-25 DIAGNOSIS — R911 Solitary pulmonary nodule: Secondary | ICD-10-CM | POA: Diagnosis not present

## 2021-09-25 MED ORDER — IPRATROPIUM-ALBUTEROL 0.5-2.5 (3) MG/3ML IN SOLN: 3.0000 mL | RESPIRATORY_TRACT | 1 refills | Status: DC | PRN

## 2021-09-25 MED ORDER — LEVALBUTEROL HCL 0.31 MG/3ML IN NEBU: 1.0000 | INHALATION_SOLUTION | RESPIRATORY_TRACT | 12 refills | Status: DC | PRN

## 2021-09-25 MED ORDER — AMOXICILLIN-POT CLAVULANATE 875-125 MG PO TABS: 1.0000 | ORAL_TABLET | Freq: Two times a day (BID) | ORAL | 0 refills | Status: AC

## 2021-09-25 NOTE — Progress Notes (Signed)
Established Patient Office Visit  Subjective   Patient ID: Thomas Sandoval., male    DOB: [birth year]  Age: 66 y.o. MRN: [id number]  Chief Complaint  Patient presents with   Shortness of Breath    Shortness of Breath   Ary is here for follow up of pneumonia and a repeat CXR per his PCP. He was treated in the hospital on 08/16/21 for sepsis due to pneumonia. He had a TOC appt with his PCP on 08/21/21. He was given some addition doxcycline at that appt. He reports that he was feeling better until 5 days ago. At that point he had increased shortness of breath and cough. He found some left over doxycyline and has been taking that since. His symptoms have improved some but he continues to feel short of breath and has an increased productive cough. He is also fatigued. Denies fever or chills. He has been using his trelegy inhaler. He has not been using his albuterol inhaler as this makes his heart race too much with his a. Fib. He does have oxygen available at home if needed. A lung nodule was noticed on his CT scan and follow up was recommend in 3 months vs PET vs tissue sampling.    Past Medical History:  Diagnosis Date   Arthritis    Bladder cancer ([place])    Chronic back pain    Chronic combined systolic and diastolic CHF (congestive heart failure) ([place]) 07/16/2021   COPD (chronic obstructive pulmonary disease) (H[facility])    DDD (degenerative disc disease)    Diabetic retinopathy of both eyes (H[facility])    Essential hypertension    GERD (gastroesophageal reflux disease)    History of atrial flutter 02/2011   Converted to NSR with Cardizem   History of chronic bronchitis    History of hemolytic anemia 02/2011   secondary to Avelox   HOH (hard of hearing)    HOH (hard of hearing)    no eardrum and nerve damage on R, also HOH on L   Mitral valve prolapse    a. 2D Echo 11/27/14: EF 55-60%; images were inadequate for LV wall motion assessment, + mild late systolic mitral valve prolapse  involving the anterior leaflet.   PAD (peripheral artery disease) ([place]) 04/2014   Dr Trula Slade; bilateral SFA occlusion, R mid, L distal   PAF (paroxysmal atrial fibrillation) ([place])    a. Dx 11/2014 during admission for perf ulcer.   Perforated ulcer ([place])    a. 11/2014 s/p surgery.   Productive cough    Smokers' cough ([place])    Type 1 diabetes mellitus ([place]) 1977      Review of Systems  Respiratory:  Positive for shortness of breath.    As per HPI.    Objective:     BP (!) 120/53   Pulse 76   Temp (!) 97.1 F (36.2 C) (Temporal)   Ht '5\' 10"'$  (1.778 m)   SpO2 95%   BMI 24.65 kg/m    Physical Exam Vitals and nursing note reviewed.  Constitutional:      General: He is not in acute distress.    Appearance: He is not ill-appearing, toxic-appearing or diaphoretic.  HENT:     Head: Normocephalic and atraumatic.  Neck:     Vascular: No JVD.  Cardiovascular:     Rate and Rhythm: Normal rate and regular rhythm.  Pulmonary:     Effort: Pulmonary effort is normal. No tachypnea or accessory muscle usage.  Breath sounds: Examination of the right-upper field reveals decreased breath sounds and rhonchi. Examination of the left-upper field reveals decreased breath sounds and rhonchi. Examination of the right-middle field reveals decreased breath sounds and rhonchi. Examination of the left-middle field reveals decreased breath sounds and rhonchi. Examination of the right-lower field reveals decreased breath sounds and rhonchi. Examination of the left-lower field reveals decreased breath sounds and rhonchi. Decreased breath sounds and rhonchi present. No wheezing or rales.  Musculoskeletal:     Right lower leg: No edema.     Left lower leg: No edema.  Skin:    General: Skin is warm and dry.  Neurological:     General: No focal deficit present.     Mental Status: He is alert and oriented to person, place, and time.  Psychiatric:        Mood and Affect: Mood normal.        Behavior:  Behavior normal.      No results found for any visits on 09/25/21.    The ASCVD Risk score (Arnett DK, et al., 2019) failed to calculate for the following reasons:   The valid total cholesterol range is 130 to 320 mg/dL    Assessment & Plan:   [place] was seen today for shortness of breath.  Diagnoses and all orders for this visit:  Pneumonia of both lower lobes due to infectious organism Some improvement on CXR today but does still look like some consolidation is present. Will do a round of augment. Duoneb ordered. Also order xopenex if patient does not tolerate albuterol component of duoneb.  -     DG Chest 2 View; Future -     amoxicillin-clavulanate (AUGMENTIN) 875-125 MG tablet; Take 1 tablet by mouth 2 (two) times daily for 10 days. -     ipratropium-albuterol (DUONEB) 0.5-2.5 (3) MG/3ML SOLN; Take 3 mLs by nebulization every 4 (four) hours as needed. -     levalbuterol (XOPENEX) 0.31 MG/3ML nebulizer solution; Take 3 mLs (0.31 mg total) by nebulization every 4 (four) hours as needed for wheezing.  Nodule of left lung Referral to pulmonology. Per last CT: recommend follow up in 3 months with CT vs PET vs tissue sampling.  -     Ambulatory referral to Pulmonology -     For home use only DME Nebulizer machine  Abnormal thyroid function test Patient declined repeat today and would prefer to wait until his follow up appt with his PCP next month.  -     Thyroid Panel With TSH; Future  Chronic obstructive pulmonary disease, unspecified COPD type ([place]) Continue tregely. Mucinex and rescue inhaler prn.   Return to office for new or worsening symptoms, or if symptoms persist.   The patient indicates understanding of these issues and agrees with the plan.   Gwenlyn Perking, FNP

## 2021-09-25 NOTE — Patient Instructions (Signed)
How to Use a Nebulizer, Adult  A nebulizer is a device that turns liquid medicine into a mist or vapor that you can breathe in (inhale). This medicine helps to open the air passages in your lungs. You may need to use a nebulizer if you have an acute breathing illness, such as pneumonia. A nebulizer may also be used to treat chronic conditions, such as asthma or chronic obstructive pulmonarydisease (COPD). There are different kinds of nebulizers. With some nebulizers, you breathe in medicine through a mouthpiece. With others, you get medicine through a mask that fits over your nose and mouth. What are the risks? If you use a nebulizer that does not fit right or is not cleaned properly, it can cause some problems, including: Infection. Eye irritation. Delivery of too much medicine or not enough medicine. Mouth irritation. Supplies needed: Air compressor (nebulizer machine). Nebulizer medicine cup (reservoir)and tubing. Mouthpiece or face mask. Soap and water. Sterile or distilled water. Clean towel. How to use a nebulizer     Preparing a nebulizer Take these steps before using your nebulizer: Read the manufacturer's instructions for your nebulizer, as machines vary. Check your medicine. Make sure it has not expired and is not damaged in any way. Wash your hands with soap and water. Put all of the parts of your nebulizer on a sturdy, flat surface. Connect the tubing to the nebulizer machine and to the reservoir. Measure the liquid medicine according to instructions from your health care provider. Pour the liquid into the reservoir. Attach the mouthpiece or mask. Test the nebulizer by turning it on to make sure that a spray comes out. Then, turn it off. Using a nebulizer Be sure to stop the machine at any time if you start coughing or if the medicine foams or bubbles. Sit in an upright, relaxed position. If your nebulizer has a mask, put it over your nose and mouth. It should fit  somewhat snugly, with no gaps around the nose or cheeks where medicine could escape. If you use a mouthpiece, put it in your mouth. Press your lips firmly around the mouthpiece. Turn on the nebulizer. Some nebulizers have a finger valve. If yours does, cover up the air hole so the air gets to the nebulizer. Once the medicine begins to mist out, take slow, deep breaths. If there is a finger valve, release it at the end of your breath. Continue taking slow, deep breaths until the medicine in the nebulizer is gone and no mist appears. Cleaning a nebulizer The nebulizer and all of its parts must be kept very clean. If the nebulizer and its parts are not cleaned properly, bacteria can grow inside of them. If you inhale the bacteria, you can get sick. Follow the manufacturer's instructions for cleaning your nebulizer. For most nebulizers, you should follow these guidelines: Clean the mouthpiece or mask and the reservoir by: Rinsing them after each use. Use sterile or distilled water. Washing them 1-2 times a week using soap and warm water. Do not wash the tubing. After you rinse or wash them, place the parts on a clean towel and let them air-dry completely. After they dry, reconnect the pieces and turn the nebulizer on without any medicine in it. Doing this will blow air through the equipment to help dry it out. Store the nebulizer in a clean and dust-free place. Check the filter at least one time every week. Replace the filter if it looks dirty. Follow these instructions at home Use your nebulizer   only as told by your health care provider. Do not use the nebulizer more than directed by your health care provider. Do not use any products that contain nicotine or tobacco, such as cigarettes, e-cigarettes, and chewing tobacco. If you need help quitting, ask your health care provider. Keep all follow-up visits as told by your health care provider. This is important. Where to find more information Allergy &  Asthma Network: allergyasthmanetwork.org American Lung Association: www.lung.org Contact a health care provider if: You have trouble using the nebulizer. Your nebulizer foams or stops working. Your nebulizer does not create a mist after you add medicine and turn it on. Get help right away if: You continue to have trouble breathing. Your breathing gets worse during a nebulizer treatment. These symptoms may represent a serious problem that is an emergency. Do not wait to see if the symptoms will go away. Get medical help right away. Call your local emergency services (911 in the [place] Do not drive yourself to the hospital. Summary A nebulizer is a device that turns liquid medicine into a mist (vapor) that you can breathe in (inhale). Measure the liquid medicine according to instructions from your health care provider. Pour the liquid into the part of the nebulizer that holds the medicine (reservoir). Once the medicine begins to mist out, take slow, deep breaths. Rinse or wash the mouthpiece or mask and the reservoir after each use, and allow them to air-dry completely. This information is not intended to replace advice given to you by your health care provider. Make sure you discuss any questions you have with your health care provider. Document Revised: 11/06/2019 Document Reviewed: 04/05/2019 Elsevier Patient Education  2023 [employer].  

## 2021-10-10 ENCOUNTER — Ambulatory Visit: Payer: BC Managed Care – PPO | Admitting: Nurse Practitioner

## 2021-10-10 ENCOUNTER — Encounter: Payer: Self-pay | Admitting: Nurse Practitioner

## 2021-10-10 VITALS — BP 101/54 | HR 61 | Temp 97.0°F | Resp 20 | Ht 70.0 in | Wt 172.0 lb

## 2021-10-10 DIAGNOSIS — Z8701 Personal history of pneumonia (recurrent): Secondary | ICD-10-CM | POA: Diagnosis not present

## 2021-10-10 NOTE — Progress Notes (Signed)
   Subjective:    Patient ID: Thomas Medici., male    DOB: Apr 27, 1955, 66 y.o.   MRN: [id number]   Chief Complaint: pneumonia  HPI  Patient was seen on 09/25/21. He was coughing. Chest xray showed pneumonia. He was treated with augmentin. He says he is still SOB and is still using oxygen at home. He has been doing duoneb neb treatments. He has a pulmonology appointment at the end of the month.   Review of Systems  Constitutional:  Negative for diaphoresis.  Eyes:  Negative for pain.  Respiratory:  Negative for shortness of breath.   Cardiovascular:  Negative for chest pain, palpitations and leg swelling.  Gastrointestinal:  Negative for abdominal pain.  Endocrine: Negative for polydipsia.  Skin:  Negative for rash.  Neurological:  Negative for dizziness, weakness and headaches.  Hematological:  Does not bruise/bleed easily.  All other systems reviewed and are negative.      Objective:   Physical Exam Constitutional:      Appearance: Normal appearance.  Cardiovascular:     Rate and Rhythm: Normal rate and regular rhythm.     Heart sounds: Normal heart sounds.  Pulmonary:     Effort: Pulmonary effort is normal.     Breath sounds: Wheezing (exp throughout) present. No rhonchi or rales.     Comments: Diminished breath sound in right lower lobe Skin:    General: Skin is warm.  Neurological:     General: No focal deficit present.     Mental Status: He is alert and oriented to person, place, and time.  Psychiatric:        Behavior: Behavior normal.    BP (!) 101/54   Pulse 61   Temp (!) 97 F (36.1 C) (Temporal)   Resp 20   Ht '5\' 10"'$  (1.778 m)   Wt 172 lb (78 kg)   SpO2 90%   BMI 24.68 kg/m         Assessment & Plan:   Thomas Medici. in today with chief complaint of No chief complaint on file.   1. Hx: recurrent pneumonia Continue duoneb Mucinex daily STOP SMOKING Continue oxygen Call pulmonology office and get on waiting list to move  appointment sooner To ED if breathing get worse.    The above assessment and management plan was discussed with the patient. The patient verbalized understanding of and has agreed to the management plan. Patient is aware to call the clinic if symptoms persist or worsen. Patient is aware when to return to the clinic for a follow-up visit. Patient educated on when it is appropriate to go to the emergency department.   [name] Hassell Done, FNP

## 2021-10-19 ENCOUNTER — Other Ambulatory Visit: Payer: Self-pay | Admitting: Nurse Practitioner

## 2021-10-19 DIAGNOSIS — E119 Type 2 diabetes mellitus without complications: Secondary | ICD-10-CM

## 2021-10-19 DIAGNOSIS — J9601 Acute respiratory failure with hypoxia: Secondary | ICD-10-CM | POA: Diagnosis not present

## 2021-10-20 DIAGNOSIS — R404 Transient alteration of awareness: Secondary | ICD-10-CM | POA: Diagnosis not present

## 2021-10-20 DIAGNOSIS — I10 Essential (primary) hypertension: Secondary | ICD-10-CM | POA: Diagnosis not present

## 2021-10-20 DIAGNOSIS — E162 Hypoglycemia, unspecified: Secondary | ICD-10-CM | POA: Diagnosis not present

## 2021-10-20 DIAGNOSIS — E161 Other hypoglycemia: Secondary | ICD-10-CM | POA: Diagnosis not present

## 2021-10-28 ENCOUNTER — Ambulatory Visit: Payer: BC Managed Care – PPO | Admitting: Nurse Practitioner

## 2021-10-28 ENCOUNTER — Encounter: Payer: Self-pay | Admitting: Nurse Practitioner

## 2021-10-28 NOTE — Progress Notes (Unsigned)
Electrophysiology Office Follow up Visit Note:    Date:  10/28/2021   ID:  Thomas Sandoval., DOB 02-Dec-1955, MRN [id number]  PCP:  Chevis Pretty, [facility] Cardiologist:  Donato Heinz, MD  [facility] Electrophysiologist:  Vickie Epley, MD    Interval History:    Thomas Marxen. is a 66 y.o. male who presents for a follow up visit. They were last seen in clinic 03/26/2021 for persistent AF. He is asymptomatic while in AF. He also has a history of GI bleeding and anemia. We planned for 6 month follow up.  He was seen by [facility] 09/08/2021. This appointment was after a hostpialization 07/2021 for rapid AFL and pneumonia. At the appt with Audry Pili he was back in sinus rhythm feeling better.   He has also seen Dr Gardiner Rhyme recently who recommended rhythm control given his history of HF and AF.        Past Medical History:  Diagnosis Date   Arthritis    Bladder cancer ([place])    Chronic back pain    Chronic combined systolic and diastolic CHF (congestive heart failure) ([place]) 07/16/2021   COPD (chronic obstructive pulmonary disease) (HCC)    DDD (degenerative disc disease)    Diabetic retinopathy of both eyes (HCC)    Essential hypertension    GERD (gastroesophageal reflux disease)    History of atrial flutter 02/2011   Converted to NSR with Cardizem   History of chronic bronchitis    History of hemolytic anemia 02/2011   secondary to Avelox   HOH (hard of hearing)    HOH (hard of hearing)    no eardrum and nerve damage on R, also HOH on L   Mitral valve prolapse    a. 2D Echo 11/27/14: EF 55-60%; images were inadequate for LV wall motion assessment, + mild late systolic mitral valve prolapse involving the anterior leaflet.   PAD (peripheral artery disease) ([place]) 04/2014   Dr Trula Slade; bilateral SFA occlusion, R mid, L distal   PAF (paroxysmal atrial fibrillation) ([place])    a. Dx 11/2014 during admission for perf ulcer.   Perforated ulcer ([place])     a. 11/2014 s/p surgery.   Productive cough    Smokers' cough ([place])    Type 1 diabetes mellitus ([place]) 1977    Past Surgical History:  Procedure Laterality Date   CARDIOVERSION N/A 01/20/2021   Procedure: CARDIOVERSION;  Surgeon: Fay Records, MD;  Location: [place];  Service: Cardiovascular;  Laterality: N/A;   CYSTOSCOPY WITH URETEROSCOPY Right 08/14/2013   Procedure: CYSTOSCOPY WITH URETEROSCOPY BLADDER BIOPSY ;  Surgeon: Claybon Jabs, MD;  Location: [facility];  Service: Urology;  Laterality: Right;   ESOPHAGOGASTRODUODENOSCOPY N/A 02/06/2013   Procedure: ESOPHAGOGASTRODUODENOSCOPY (EGD);  Surgeon: Lafayette Dragon, MD;  [facility] END[facility]COPY;  Service: Endoscopy;  Laterality: N/A;   LAPAROSCOPY N/A 11/25/2014   Procedure: LAPAROSCOPIC PRIMARY REPAIR OF PERFORATED PREPYLORIC ULCER WITH Silvestre Gunner;  Surgeon: Greer Pickerel, MD;  Location: [place];  Service: General;  Laterality: N/A;   TONSILLECTOMY  as child   TRANSTHORACIC ECHOCARDIOGRAM  02-17-2011   MODERATE LVH/  EF 65%   TRANSURETHRAL RESECTION OF BLADDER TUMOR WITH GYRUS (TURBT-GYRUS) N/A 06/12/2013   Procedure: TRANSURETHRAL RESECTION OF BLADDER TUMOR WITH GYRUS (TURBT-GYRUS);  Surgeon: Claybon Jabs, MD;  Location: [facility];  Service: Urology;  Laterality: N/A;   TYMPANIC MEMBRANE REPAIR  as child    Current Medications: No  outpatient medications have been marked as taking for the 10/29/21 encounter (Appointment) with Vickie Epley, MD.     Allergies:   Azithromycin, Avelox [moxifloxacin hcl in nacl], and Bactrim [sulfamethoxazole-trimethoprim]   Social History   Socioeconomic History   Marital status: Married    Spouse name: Not on file   Number of children: Not on file   Years of education: Not on file   Highest education level: Not on file  Occupational History   Occupation: Tobacco Farmer  Tobacco Use   Smoking status: Every Day    Packs/day: 2.00    Years: 30.00    Total  pack years: 60.00    Types: Cigarettes   Smokeless tobacco: Former    Quit date: 06/08/1978   Tobacco comments:    Couple packs per day 12/25/2020  Vaping Use   Vaping Use: Never used  Substance and Sexual Activity   Alcohol use: Not Currently    Comment: 5 quarts per week   Drug use: No   Sexual activity: Not on file  Other Topics Concern   Not on file  Social History Narrative   Lives in [place] with wife and 2 sons.    Social Determinants of Health   Financial Resource Strain: Not on file  Food Insecurity: Not on file  Transportation Needs: Not on file  Physical Activity: Not on file  Stress: Not on file  Social Connections: Not on file     Family History: The patient's family history includes Breast cancer in his mother; Cancer in his mother; Diabetes in his son; Heart attack in his father; Heart disease in his father; Rheumatic fever in his father.  ROS:   Please see the history of present illness.    All other systems reviewed and are negative.  EKGs/Labs/Other Studies Reviewed:    The following studies were reviewed today:     Recent Labs: 04/09/2021: Magnesium 2.0 07/16/2021: B Natriuretic Peptide 950.1 08/17/2021: ALT 11; Hemoglobin 11.9; Platelets 252; TSH 0.322 08/21/2021: BUN 16; Creatinine, Ser 1.12; Potassium 4.1; Sodium 132  Recent Lipid Panel    Component Value Date/Time   CHOL 121 10/25/2020 1442   TRIG 143 10/25/2020 1442   HDL 54 10/25/2020 1442   CHOLHDL 2.2 10/25/2020 1442   LDLCALC 43 10/25/2020 1442    Physical Exam:    VS:  There were no vitals taken for this visit.    Wt Readings from Last 3 Encounters:  10/10/21 172 lb (78 kg)  09/08/21 171 lb 12.8 oz (77.9 kg)  09/04/21 170 lb (77.1 kg)     GEN: *** Well nourished, well developed in no acute distress HEENT: Normal NECK: No JVD; No carotid bruits LYMPHATICS: No lymphadenopathy CARDIAC: ***RRR, no murmurs, rubs, gallops RESPIRATORY:  Clear to auscultation without rales,  wheezing or rhonchi  ABDOMEN: Soft, non-tender, non-distended MUSCULOSKELETAL:  No edema; No deformity  SKIN: Warm and dry NEUROLOGIC:  Alert and oriented x 3 PSYCHIATRIC:  Normal affect        ASSESSMENT:    1. Atrial flutter, unspecified type ([place])   2. Hypertension associated with diabetes ([place])   3. Persistent atrial fibrillation ([place])   4. Chronic combined systolic and diastolic CHF (congestive heart failure) (HCC)    PLAN:    In order of problems listed above:  #AF #AFL   #Chronic systolic HF    PVI, CTI       Total time spent with patient today *** minutes. This includes reviewing records, evaluating  the patient and coordinating care.   Medication Adjustments/Labs and Tests Ordered: Current medicines are reviewed at length with the patient today.  Concerns regarding medicines are outlined above.  No orders of the defined types were placed in this encounter.  No orders of the defined types were placed in this encounter.    Signed, Thomas Mage, MD, [facility], [facility] 10/28/2021 9:25 PM    Electrophysiology [facility]

## 2021-10-29 ENCOUNTER — Encounter: Payer: Self-pay | Admitting: Cardiology

## 2021-10-29 ENCOUNTER — Telehealth: Payer: Self-pay | Admitting: Pharmacist

## 2021-10-29 ENCOUNTER — Ambulatory Visit (INDEPENDENT_AMBULATORY_CARE_PROVIDER_SITE_OTHER): Payer: BC Managed Care – PPO | Admitting: Cardiology

## 2021-10-29 VITALS — BP 114/68 | HR 66 | Ht 70.0 in | Wt 169.4 lb

## 2021-10-29 DIAGNOSIS — E1159 Type 2 diabetes mellitus with other circulatory complications: Secondary | ICD-10-CM

## 2021-10-29 DIAGNOSIS — I5042 Chronic combined systolic (congestive) and diastolic (congestive) heart failure: Secondary | ICD-10-CM | POA: Diagnosis not present

## 2021-10-29 DIAGNOSIS — I152 Hypertension secondary to endocrine disorders: Secondary | ICD-10-CM

## 2021-10-29 DIAGNOSIS — I4892 Unspecified atrial flutter: Secondary | ICD-10-CM | POA: Diagnosis not present

## 2021-10-29 DIAGNOSIS — I4819 Other persistent atrial fibrillation: Secondary | ICD-10-CM | POA: Diagnosis not present

## 2021-10-29 NOTE — Telephone Encounter (Signed)
Medication list reviewed in anticipation of upcoming Tikosyn initiation. Patient is on multiple inhalers/nebulizers that are QTc prolonging (albuterol, Trelegy, Duoneb, Xopenex) however are not contraindicated with Tikosyn.  Patient is anticoagulated on Xarelto '20mg'$  daily on the appropriate dose. Please ensure that patient has not missed any anticoagulation doses in the 3 weeks prior to Tikosyn initiation.   Patient will need to be counseled to avoid use of Benadryl while on Tikosyn and in the 2-3 days prior to Tikosyn initiation.

## 2021-10-29 NOTE — Patient Instructions (Signed)
Medication Instructions:  none *If you need a refill on your cardiac medications before your next appointment, please call your pharmacy*   Lab Work: none If you have labs (blood work) drawn today and your tests are completely normal, you will receive your results only by: [facility] (if you have MyChart) OR A paper copy in the mail If you have any lab test that is abnormal or we need to change your treatment, we will call you to review the results.   Testing/Procedures: none   Follow-Up: At [facility], you and your health needs are our priority.  As part of our continuing mission to provide you with exceptional heart care, we have created designated Provider Care Teams.  These Care Teams include your primary Cardiologist (physician) and Advanced Practice Providers (APPs -  Physician Assistants and Nurse Practitioners) who all work together to provide you with the care you need, when you need it.  We recommend signing up for the patient portal called "MyChart".  Sign up information is provided on this After Visit Summary.  MyChart is used to connect with patients for Virtual Visits (Telemedicine).  Patients are able to view lab/test results, encounter notes, upcoming appointments, etc.  Non-urgent messages can be sent to your provider as well.   To learn more about what you can do with MyChart, go to [email or web address].    Your next appointment:   Erline Levine from the [facility] will be in touch to set up your Tikosyn admission.   Tikosyn (Dofetilide) Hospital Admission   Prior to day of admission:  Check with drug insurance company for cost of drug to ensure affordability --- Dofetilide 500 mcg twice a day.  GoodRx is an option if insurance copay is unaffordable.   All patients are tested for COVID-19 prior to admission.   No Benadryl is allowed 3 days prior to admission.   Please ensure no missed doses of your anticoagulation (blood thinner) for 3 weeks prior to  admission. If a dose is missed please notify our office immediately.   A pharmacist will review all your medications for potential interactions with Tikosyn. If any medication changes are needed prior to admission we will be in touch with you.   If any new medications are started AFTER your admission date is set with Nurse, adult. Please notify our office immediately so your medication list can be updated and reviewed by our pharmacist again.  On day of admission:  Tikosyn initiation requires a 3 night/4 day hospital stay with constant telemetry monitoring. You will have an EKG after each dose of Tikosyn as well as daily lab draws.   If the drug does not convert you to normal rhythm a cardioversion after the 4th dose of Tikosyn.   Afib Clinic office visit on the morning of admission is needed for preliminary labs/ekg.   Time of admission is dependent on bed availability in the hospital. In some instances, you will be sent home until bed is available. Rarely admission can be delayed to the following day if hospital census prevents available beds.   You may bring personal belongings/clothing with you to the hospital. Please leave your suitcase in the car until you arrive in admissions.   Questions please call our office at [place]

## 2021-10-29 NOTE — Progress Notes (Signed)
Electrophysiology Office Follow up Visit Note:    Date:  10/29/2021   ID:  Thomas Medici., DOB 1955-03-14, MRN [id number]  PCP:  Thomas Sandoval, Thomas Sandoval Cardiologist:  Thomas Heinz, Sandoval  [facility] Sandoval Electrophysiologist:  Thomas Sandoval    Interval History:    Thomas Butch. is a 66 y.o. male who presents for a follow up visit. They were last seen in clinic 03/26/2021 for persistent AF. Sandoval is asymptomatic while in AF. Sandoval also has a history of GI bleeding and anemia. We planned for 6 month follow up.  Sandoval was seen by Thomas Sandoval 09/08/2021. This appointment was after a hostpialization 07/2021 for rapid AFL and pneumonia. At the appt with Thomas Sandoval was back in sinus rhythm feeling better.   Sandoval has also seen Thomas Sandoval recently who recommended rhythm control given his history of HF and AF.   Today:  Sandoval has been using oxygen the past 3 months after contracting pneumonia. Sandoval has flare-ups of pneumonia since then. Sandoval feels the nebulizer helps more than anything else with his lungs.  Sandoval cannot always tell when Sandoval is out of rhythm.   Sandoval gets migraines, which are accompanied by vomiting. The next day or two his head will throb. Sandoval has been dealing with this for decades.  During activity, his heart rate will drop. When this happens, Sandoval will rest until Sandoval feels better and resume activity. Sandoval continues to farm wheat.  The patient continues to smoke.   Sandoval denies any chest pain or peripheral edema. No lightheadedness, syncope, orthopnea, or PND.      Past Medical History:  Diagnosis Date   Arthritis    Bladder cancer ([place])    Chronic back pain    Chronic combined systolic and diastolic CHF (congestive heart failure) ([place]) 07/16/2021   COPD (chronic obstructive pulmonary disease) (HCC)    DDD (degenerative disc disease)    Diabetic retinopathy of both eyes (HCC)    Essential hypertension    GERD (gastroesophageal reflux disease)    History of  atrial flutter 02/2011   Converted to NSR with Cardizem   History of chronic bronchitis    History of hemolytic anemia 02/2011   secondary to Avelox   HOH (hard of hearing)    HOH (hard of hearing)    no eardrum and nerve damage on R, also HOH on L   Mitral valve prolapse    a. 2D Echo 11/27/14: EF 55-60%; images were inadequate for LV wall motion assessment, + mild late systolic mitral valve prolapse involving the anterior leaflet.   PAD (peripheral artery disease) ([place]) 04/2014   Thomas Sandoval; bilateral SFA occlusion, R mid, L distal   PAF (paroxysmal atrial fibrillation) ([place])    a. Dx 11/2014 during admission for perf ulcer.   Perforated ulcer ([place])    a. 11/2014 s/p surgery.   Productive cough    Smokers' cough ([place])    Type 1 diabetes mellitus ([place]) 1977    Past Surgical History:  Procedure Laterality Date   CARDIOVERSION N/A 01/20/2021   Procedure: CARDIOVERSION;  Surgeon: Fay Records, Sandoval;  Location: [place];  Service: Cardiovascular;  Laterality: N/A;   CYSTOSCOPY WITH URETEROSCOPY Right 08/14/2013   Procedure: CYSTOSCOPY WITH URETEROSCOPY BLADDER BIOPSY ;  Surgeon: Claybon Jabs, Sandoval;  Location: [facility];  Service: Urology;  Laterality: Right;   ESOPHAGOGASTRODUODENOSCOPY N/A 02/06/2013   Procedure: ESOPHAGOGASTRODUODENOSCOPY (EGD);  Surgeon: Lowella Bandy  Olevia Perches, Sandoval;  Location: [facility] ENDOSCOPY;  Service: Endoscopy;  Laterality: N/A;   LAPAROSCOPY N/A 11/25/2014   Procedure: LAPAROSCOPIC PRIMARY REPAIR OF PERFORATED PREPYLORIC ULCER WITH Silvestre Gunner;  Surgeon: Greer Pickerel, Sandoval;  Location: [place];  Service: General;  Laterality: N/A;   TONSILLECTOMY  as child   TRANSTHORACIC ECHOCARDIOGRAM  02-17-2011   MODERATE LVH/  EF 65%   TRANSURETHRAL RESECTION OF BLADDER TUMOR WITH GYRUS (TURBT-GYRUS) N/A 06/12/2013   Procedure: TRANSURETHRAL RESECTION OF BLADDER TUMOR WITH GYRUS (TURBT-GYRUS);  Surgeon: Claybon Jabs, Sandoval;  Location: [facility];  Service: Urology;   Laterality: N/A;   TYMPANIC MEMBRANE REPAIR  as child    Current Medications: No outpatient medications have been marked as taking for the 10/29/21 encounter (Appointment) with Thomas Sandoval.     Allergies:   Azithromycin, Avelox [moxifloxacin hcl in nacl], and Bactrim [sulfamethoxazole-trimethoprim]   Social History   Socioeconomic History   Marital status: Married    Spouse name: Not on file   Number of children: Not on file   Years of education: Not on file   Highest education level: Not on file  Occupational History   Occupation: Tobacco Farmer  Tobacco Use   Smoking status: Every Day    Packs/day: 2.00    Years: 30.00    Total pack years: 60.00    Types: Cigarettes   Smokeless tobacco: Former    Quit date: 06/08/1978   Tobacco comments:    Couple packs per day 12/25/2020  Vaping Use   Vaping Use: Never used  Substance and Sexual Activity   Alcohol use: Not Currently    Comment: 5 quarts per week   Drug use: No   Sexual activity: Not on file  Other Topics Concern   Not on file  Social History Narrative   Lives in [place] with wife and 2 sons.    Social Determinants of Health   Financial Resource Strain: Not on file  Food Insecurity: Not on file  Transportation Needs: Not on file  Physical Activity: Not on file  Stress: Not on file  Social Connections: Not on file     Family History: The patient's family history includes Breast cancer in his mother; Cancer in his mother; Diabetes in his son; Heart attack in his father; Heart disease in his father; Rheumatic fever in his father.  ROS:   Please see the history of present illness.   (+)Migraines (+)Recurrent lung pain/infection (+)Afib    All other systems reviewed and are negative.  EKGs/Labs/Other Studies Reviewed:    The following studies were reviewed today:  Echo 05/21/21:  IMPRESSIONS     1. Left ventricular ejection fraction, by estimation, is 55 to 60%. The  left ventricle has  normal function. The left ventricle has no regional  wall motion abnormalities. Left ventricular diastolic parameters are  consistent with Grade II diastolic  dysfunction (pseudonormalization). Elevated left ventricular end-diastolic  pressure.   2. Right ventricular systolic function is normal. The right ventricular  size is normal. There is normal pulmonary artery systolic pressure.   3. The mitral valve is normal in structure. No evidence of mitral valve  regurgitation. No evidence of mitral stenosis.   4. The aortic valve is tricuspid. Aortic valve regurgitation is not  visualized. No aortic stenosis is present.   5. The inIMPRESSIONS     1. Left ventricular ejection fraction, by estimation, is 55 to 60%. The  left ventricle has normal function. The left ventricle  has no regional  wall motion abnormalities. Left ventricular diastolic parameters are  consistent with Grade II diastolic  dysfunction (pseudonormalization). Elevated left ventricular end-diastolic  pressure.   2. Right ventricular systolic function is normal. The right ventricular  size is normal. There is normal pulmonary artery systolic pressure.   3. The mitral valve is normal in structure. No evidence of mitral valve  regurgitation. No evidence of mitral stenosis.   4. The aortic valve is tricuspid. Aortic valve regurgitation is not  visualized. No aortic stenosis is present.   5. The inferior vena cava is normal in size with greater than 50%  respiratory variability, suggesting right atrial pressure of 3 mmHg. ferior vena cava is normal in size with greater than 50%  respiratory variability, suggesting right atrial pressure of 3 mmHg.    Long Term Monitor 02/10/21:  Patch Wear Time:  3 days and 2 hours (2022-11-18T16:17:53-0500 to 2022-11-21T18:52:10-498)   Atrial Fibrillation occurred continuously (100% burden), ranging from 53-185 bpm (avg of 95 bpm). Isolated VEs were rare (<1.0%), VE Couplets were rare  (<1.0%), and no VE Triplets were present.  No patient triggered events  Echo 12/05/20:  IMPRESSIONS     1. Left ventricular ejection fraction, by estimation, is 30 to 35%. Left  ventricular ejection fraction by 3D volume is 34 %. The left ventricle has  moderately decreased function. The left ventricle demonstrates global  hypokinesis. Left ventricular  diastolic parameters are indeterminate.   2. Right ventricular systolic function is low normal. The right  ventricular size is normal. There is normal pulmonary artery systolic  pressure.   3. Left atrial size was moderately dilated.   4. The mitral valve is abnormal. Trivial mitral valve regurgitation  related to prolapse. No evidence of mitral stenosis.   5. The aortic valve is tricuspid. Aortic valve regurgitation is not  visualized. No aortic stenosis is present.   Comparison(s): A prior study was performed on 08/26/2017. Decrease in LVEF.    Lower Extremity Doppler 04/02/17:  Final Interpretation:  Right: Resting right ankle-brachial index indicates moderate right lower  extremity arterial disease. The right toe-brachial index is abnormal. PPG  tracings appear dampened.  Left: Resting left ankle-brachial index indicates severe left lower  extremity arterial disease. The left toe-brachial index is abnormal. PPG  tracings appear dampened.   EKG:  10/29/21: NSR; QTC 465 ms   Recent Labs: 04/09/2021: Magnesium 2.0 07/16/2021: B Natriuretic Peptide 950.1 08/17/2021: ALT 11; Hemoglobin 11.9; Platelets 252; TSH 0.322 08/21/2021: BUN 16; Creatinine, Ser 1.12; Potassium 4.1; Sodium 132  Recent Lipid Panel    Component Value Date/Time   CHOL 121 10/25/2020 1442   TRIG 143 10/25/2020 1442   HDL 54 10/25/2020 1442   CHOLHDL 2.2 10/25/2020 1442   LDLCALC 43 10/25/2020 1442    Physical Exam:    VS:  There were no vitals taken for this visit.    Wt Readings from Last 3 Encounters:  10/10/21 172 lb (78 kg)  09/08/21 171 lb 12.8  oz (77.9 kg)  09/04/21 170 lb (77.1 kg)     GEN: Appears older than stated age. No distress. HEENT: Normal NECK: No JVD; No carotid bruits LYMPHATICS: No lymphadenopathy CARDIAC: RRR, no murmurs, rubs, gallops RESPIRATORY:  Clear to auscultation without rales, wheezing or rhonchi  ABDOMEN: Soft, non-tender, non-distended MUSCULOSKELETAL:  No edema; No deformity  SKIN: Warm and dry NEUROLOGIC:  Alert and oriented x 3 PSYCHIATRIC:  Normal affect        ASSESSMENT:  1. Atrial flutter, unspecified type ([place])   2. Hypertension associated with diabetes ([place])   3. Persistent atrial fibrillation ([place])   4. Chronic combined systolic and diastolic CHF (congestive heart failure) (HCC)    PLAN:    In order of problems listed above:  #AF #AFL Long discussion with the patient about rhythm control options. We discussed antiarrhythmic drug therapy and catheter ablation.  Given his intermittent oxygen use, ongoing tobacco abuse I do not think Sandoval is a great candidate for catheter ablation.  Given the oxygen use, amiodarone is not an ideal antiarrhythmic drug for him.  I discussed Tikosyn in detail with the patient during today's visit including the risks and need for inpatient hospitalization.  His creatinine and EKG are acceptable for initial loading dose. Continue Xarelto for stroke prophylaxis. We will work on getting the [place] admission scheduled.  #Chronic combined systolic diastolic heart failure NYHA class II-III.  Rhythm control is indicated.  Warm and relatively euvolemic on exam.  Continue current medical therapy including Lasix, Jardiance, metoprolol and losartan.  #Hypertension Controlled.  Continue current medical therapy.  #Tobacco abuse Cessation encouraged    Medication Adjustments/Labs and Tests Ordered: Current medicines are reviewed at length with the patient today.  Concerns regarding medicines are outlined above.  No orders of the defined types were placed in  this encounter.  No orders of the defined types were placed in this encounter.   I,Mary Mosetta Pigeon Buren,acting as a scribe for Thomas Sandoval.,have documented all relevant documentation on the behalf of Thomas Sandoval,as directed by  Thomas Sandoval while in the presence of Thomas Sandoval.   I, Thomas Sandoval, have reviewed all documentation for this visit. The documentation on 10/29/21 for the exam, diagnosis, procedures, and orders are all accurate and complete.   Signed, Lars Mage, Sandoval, [facility] 10/29/2021 12:50 PM    Electrophysiology [facility] Sandoval

## 2021-10-30 ENCOUNTER — Encounter: Payer: Self-pay | Admitting: Nurse Practitioner

## 2021-10-30 ENCOUNTER — Ambulatory Visit (INDEPENDENT_AMBULATORY_CARE_PROVIDER_SITE_OTHER): Payer: BC Managed Care – PPO | Admitting: Nurse Practitioner

## 2021-10-30 VITALS — BP 112/60 | HR 67 | Temp 97.4°F | Resp 20 | Ht 70.0 in | Wt 171.0 lb

## 2021-10-30 DIAGNOSIS — E1159 Type 2 diabetes mellitus with other circulatory complications: Secondary | ICD-10-CM

## 2021-10-30 DIAGNOSIS — E871 Hypo-osmolality and hyponatremia: Secondary | ICD-10-CM

## 2021-10-30 DIAGNOSIS — K2100 Gastro-esophageal reflux disease with esophagitis, without bleeding: Secondary | ICD-10-CM

## 2021-10-30 DIAGNOSIS — J449 Chronic obstructive pulmonary disease, unspecified: Secondary | ICD-10-CM | POA: Diagnosis not present

## 2021-10-30 DIAGNOSIS — E119 Type 2 diabetes mellitus without complications: Secondary | ICD-10-CM | POA: Diagnosis not present

## 2021-10-30 DIAGNOSIS — E875 Hyperkalemia: Secondary | ICD-10-CM

## 2021-10-30 DIAGNOSIS — I152 Hypertension secondary to endocrine disorders: Secondary | ICD-10-CM | POA: Diagnosis not present

## 2021-10-30 DIAGNOSIS — G8929 Other chronic pain: Secondary | ICD-10-CM

## 2021-10-30 DIAGNOSIS — K860 Alcohol-induced chronic pancreatitis: Secondary | ICD-10-CM

## 2021-10-30 DIAGNOSIS — N289 Disorder of kidney and ureter, unspecified: Secondary | ICD-10-CM

## 2021-10-30 DIAGNOSIS — I5042 Chronic combined systolic (congestive) and diastolic (congestive) heart failure: Secondary | ICD-10-CM

## 2021-10-30 DIAGNOSIS — Z794 Long term (current) use of insulin: Secondary | ICD-10-CM

## 2021-10-30 DIAGNOSIS — I4819 Other persistent atrial fibrillation: Secondary | ICD-10-CM | POA: Diagnosis not present

## 2021-10-30 DIAGNOSIS — M545 Low back pain, unspecified: Secondary | ICD-10-CM

## 2021-10-30 DIAGNOSIS — R600 Localized edema: Secondary | ICD-10-CM

## 2021-10-30 LAB — BAYER DCA HB A1C WAIVED: HB A1C (BAYER DCA - WAIVED): 5.2 % (ref 4.8–5.6)

## 2021-10-30 MED ORDER — METOPROLOL SUCCINATE ER 100 MG PO TB24: 100.0000 mg | ORAL_TABLET | Freq: Every day | ORAL | 3 refills | Status: DC

## 2021-10-30 MED ORDER — FUROSEMIDE 40 MG PO TABS: 40.0000 mg | ORAL_TABLET | Freq: Every day | ORAL | 3 refills | Status: DC | PRN

## 2021-10-30 MED ORDER — EMPAGLIFLOZIN 10 MG PO TABS: 10.0000 mg | ORAL_TABLET | Freq: Every day | ORAL | 11 refills | Status: DC

## 2021-10-30 MED ORDER — FAMOTIDINE 20 MG PO TABS: 20.0000 mg | ORAL_TABLET | Freq: Every morning | ORAL | 5 refills | Status: DC

## 2021-10-30 MED ORDER — LOSARTAN POTASSIUM 50 MG PO TABS: 50.0000 mg | ORAL_TABLET | Freq: Every day | ORAL | Status: DC

## 2021-10-30 MED ORDER — HYDROCODONE-ACETAMINOPHEN 5-325 MG PO TABS: 1.0000 | ORAL_TABLET | Freq: Two times a day (BID) | ORAL | 0 refills | Status: DC

## 2021-10-30 MED ORDER — RIVAROXABAN 20 MG PO TABS: 20.0000 mg | ORAL_TABLET | Freq: Every day | ORAL | 5 refills | Status: DC

## 2021-10-30 MED ORDER — INSULIN GLARGINE 100 UNIT/ML ~~LOC~~ SOLN
20.0000 [IU] | Freq: Every day | SUBCUTANEOUS | 5 refills | Status: DC
Start: 2021-10-30 — End: 2022-02-02

## 2021-10-30 NOTE — Progress Notes (Signed)
Subjective:    Patient ID: Thomas Medici., male    DOB: 08/20/1955, 66 y.o.   MRN: [id number]   Chief Complaint: medical management of chronic issues     HPI:  Thomas Sandoval. is a 66 y.o. who identifies as a male who was assigned male at birth.   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 Hypertension associated with diabetes ([place]) No c/o chest pain or headache. Has occasional SOB from COPD. BP Readings from Last 3 Encounters:  10/29/21 114/68  10/10/21 (!) 101/54  09/25/21 (!) 120/53     2. Persistent atrial fibrillation/flutter with rapid ventricular response (HCC) Denies palpitation or heart racing. Is currently on xeralto daily with no bleeding issues.  3. Chronic combined systolic and diastolic CHF (congestive heart failure) ([place]) Saw cardiology yesterday. He was in atrial fib at visit. Discussed treatment options with him. Not an ablatoin candidate, so thre plan is to start tikosyn which is to be scheduled.  4. COPD with chronic bronchitis and emphysema ([place]) Is no on oxygen at home and he continues to smoke. Smokes over a pack a day  5. Alcohol-induced chronic pancreatitis [facility]) Wife says he has not been drinking much lately since he is not able to go out and buy  it himself  6. Gastroesophageal reflux disease with esophagitis without hemorrhage Is on pepcid daily and is doing well.  7. Insulin dependent type 2 diabetes mellitus ([place]) He does not check his blood sugars often' Lab Results  Component Value Date   HGBA1C 5.7 (H) 07/17/2021     8. Hyperkalemia No c/o muscle cramping  9. Hyponatremia Last sodium level was 132.   10. Chronic midline low back pain without sciatica Has chronic back pain. Has appointmnet with pain management next week. Pain assessment: Cause of pain- DDD Pain  location- lower back Pain on scale of 1-10- 7-8/10 Frequency- daily What increases pain-to much activity What makes pain Better-rest helps Effects on ADL - none Any change in general medical condition-none  Current opioids rx- norco 5/325 BID # meds rx- 60 Effectiveness of current meds-none Adverse reactions from pain meds-none Morphine equivalent- 10MME  Pill count performed-Yes Last drug screen - 04/28/21 ( high risk q42m moderate risk q623mlow risk yearly ) Urine drug screen today- No Was the NCFunstoneviewed- yes  If yes were their any concerning findings? - no   Overdose risk: 1    04/28/2021    1:17 PM  Opioid Risk   Alcohol 0  Illegal Drugs 0  Rx Drugs 0  Alcohol 3  Illegal Drugs 0  Rx Drugs 0  Age between 16-45 years  0  History of Preadolescent Sexual Abuse 0  Psychological Disease 0  Depression 0  Opioid Risk Tool Scoring 3  Opioid Risk Interpretation Low Risk     Pain contract signed on:   11. Mild renal insufficiency Lab Results  Component Value Date   CREATININE 1.12 08/21/2021      New complaints: None today  Allergies  Allergen Reactions   Azithromycin Other (See Comments) and Nausea And Vomiting    "Severe stomach cramps; told to list as an allergy by dr. Years ago"   Avelox [Moxifloxacin Hcl In Nacl] Other (See Comments)    Hemolysis  In 2012  Bactrim [Sulfamethoxazole-Trimethoprim] Diarrhea and Nausea And Vomiting   Outpatient Encounter Medications as of 10/30/2021  Medication Sig   acetaminophen (TYLENOL) 500 MG tablet Take 500 mg by mouth every 6 (six) hours as needed for moderate pain or headache.   albuterol (VENTOLIN HFA) 108 (90 Base) MCG/ACT inhaler Inhale 2 puffs into the lungs every 6 (six) hours as needed for wheezing or shortness of breath.   cetirizine (ZYRTEC) 10 MG tablet Take 10 mg by mouth daily.   empagliflozin (JARDIANCE) 10 MG TABS tablet Take 1 tablet (10 mg total) by mouth daily before breakfast.   famotidine  (PEPCID) 20 MG tablet Take 1 tablet (20 mg total) by mouth every morning. Reported on 04/16/2015   Fluticasone-Umeclidin-Vilant (TRELEGY ELLIPTA) 100-62.5-25 MCG/ACT AEPB INHALE 1 PUFF INTO THE LUNGS TWICE A DAY (Patient taking differently: Inhale 1 puff into the lungs daily. INHALE 1 PUFF INTO THE LUNGS TWICE A DAY)   furosemide (LASIX) 40 MG tablet Take 1 tablet (40 mg total) by mouth daily as needed (weight gain of 3 lbs overnight or 5 lbs in a week).   insulin glargine (LANTUS) 100 UNIT/ML injection Inject 0.2 mLs (20 Units total) into the skin daily.   Insulin Syringe-Needle U-100 (B-D INS SYR ULTRAFINE 1CC/30G) 30G X 1/2" 1 ML MISC Use 4 times a day with insulin Dx E11.9   Insulin Syringes, Disposable, U-100 1 ML MISC Use 4 times a day for insulin injection Dx E11.9   ipratropium-albuterol (DUONEB) 0.5-2.5 (3) MG/3ML SOLN Take 3 mLs by nebulization every 4 (four) hours as needed.   levalbuterol (XOPENEX) 0.31 MG/3ML nebulizer solution Take 3 mLs (0.31 mg total) by nebulization every 4 (four) hours as needed for wheezing.   losartan (COZAAR) 50 MG tablet Take 1 tablet (50 mg total) by mouth at bedtime. hold this medication until follow up   metoprolol succinate (TOPROL-XL) 100 MG 24 hr tablet Take 1 tablet (100 mg total) by mouth daily. Take with or immediately following a meal.   NOVOLOG 100 UNIT/ML injection PER SLIDING SCALE: 190 - 200 = 2 UNITS. 300 AND ABOVE = 7 UNITS.   OVER THE COUNTER MEDICATION Take 1 Scoop by mouth in the morning and at bedtime. Super Beets   rivaroxaban (XARELTO) 20 MG TABS tablet Take 1 tablet (20 mg total) by mouth daily with supper.   No facility-administered encounter medications on file as of 10/30/2021.    Past Surgical History:  Procedure Laterality Date   CARDIOVERSION N/A 01/20/2021   Procedure: CARDIOVERSION;  Surgeon: Fay Records, MD;  Location: [place];  Service: Cardiovascular;  Laterality: N/A;   CYSTOSCOPY WITH URETEROSCOPY Right 08/14/2013    Procedure: CYSTOSCOPY WITH URETEROSCOPY BLADDER BIOPSY ;  Surgeon: Claybon Jabs, MD;  Location[facility];  Service: Urology;  Laterality: Right;   ESOPHAGOGASTRODUODENOSCOPY N/A 02/06/2013   Procedure: ESOPHAGOGASTRODUODENOSCOPY (EGD);  Surgeon: Lafayette Dragon, MD;  Location: [facility] ENDOSCOPY;  Service: Endoscopy;  Laterality: N/A;   LAPAROSCOPY N/A 11/25/2014   Procedure: LAPAROSCOPIC PRIMARY REPAIR OF PERFORATED PREPYLORIC ULCER WITH Silvestre Gunner;  Surgeon: Greer Pickerel, MD;  Location: [place];  Service: General;  Laterality: N/A;   TONSILLECTOMY  as child   TRANSTHORACIC ECHOCARDIOGRAM  02-17-2011   MODERATE LVH/  EF 65%   TRANSURETHRAL RESECTION OF BLADDER TUMOR WITH GYRUS (TURBT-GYRUS) N/A 06/12/2013   Procedure: TRANSURETHRAL RESECTION OF BLADDER TUMOR WITH GYRUS (TURBT-GYRUS);  Surgeon: Claybon Jabs, MD;  Location: [facility];  Service: Urology;  Laterality:  N/A;   TYMPANIC MEMBRANE REPAIR  as child    Family History  Problem Relation Age of Onset   Breast cancer Mother    Cancer Mother        Breast   Rheumatic fever Father    Heart disease Father    Heart attack Father        Massive    Diabetes Son       Controlled substance contract: 10/30/21     Review of Systems  Constitutional:  Negative for diaphoresis.  Eyes:  Negative for pain.  Respiratory:  Negative for shortness of breath.   Cardiovascular:  Negative for chest pain, palpitations and leg swelling.  Gastrointestinal:  Negative for abdominal pain.  Endocrine: Negative for polydipsia.  Skin:  Negative for rash.  Neurological:  Negative for dizziness, weakness and headaches.  Hematological:  Does not bruise/bleed easily.  All other systems reviewed and are negative.      Objective:   Physical Exam Vitals and nursing note reviewed.  Constitutional:      Appearance: Normal appearance. He is well-developed.  HENT:     Head: Normocephalic.     Nose: Nose normal.      Mouth/Throat:     Mouth: Mucous membranes are moist.     Pharynx: Oropharynx is clear.  Eyes:     Pupils: Pupils are equal, round, and reactive to light.  Neck:     Thyroid: No thyroid mass or thyromegaly.     Vascular: No carotid bruit or JVD.     Trachea: Phonation normal.  Cardiovascular:     Rate and Rhythm: Normal rate and regular rhythm.  Pulmonary:     Effort: Pulmonary effort is normal. No respiratory distress.     Breath sounds: Normal breath sounds.  Abdominal:     General: Bowel sounds are normal.     Palpations: Abdomen is soft.     Tenderness: There is no abdominal tenderness.  Musculoskeletal:        General: Normal range of motion.     Cervical back: Normal range of motion and neck supple.  Lymphadenopathy:     Cervical: No cervical adenopathy.  Skin:    General: Skin is warm and dry.  Neurological:     Mental Status: He is alert and oriented to person, place, and time.  Psychiatric:        Behavior: Behavior normal.        Thought Content: Thought content normal.        Judgment: Judgment normal.     BP 112/60   Pulse 67   Temp (!) 97.4 F (36.3 C) (Temporal)   Resp 20   Ht 5' 10"  (1.778 m)   Wt 171 lb (77.6 kg)   SpO2 95%   BMI 24.54 kg/m         Assessment & Plan:  Thomas Medici. comes in today with chief complaint of Medical Management of Chronic Issues   Diagnosis and orders addressed:  1. Hypertension associated with diabetes ([place]) Low sodium diet - CBC with Differential/Platelet - CMP14+EGFR - Lipid panel - losartan (COZAAR) 50 MG tablet; Take 1 tablet (50 mg total) by mouth at bedtime. hold this medication until follow up  2. Persistent atrial fibrillation/flutter with rapid ventricular response ([place]) Keep appointment for tiksosin - rivaroxaban (XARELTO) 20 MG TABS tablet; Take 1 tablet (20 mg total) by mouth daily with supper.  Dispense: 30 tablet; Refill: 5  3. Chronic combined systolic and  diastolic CHF (congestive  heart failure) (HCC) Smoking cessation encouraged - metoprolol succinate (TOPROL-XL) 100 MG 24 hr tablet; Take 1 tablet (100 mg total) by mouth daily. Take with or immediately following a meal.  Dispense: 90 tablet; Refill: 3  4. COPD with chronic bronchitis and emphysema ([place]) Continue trelogy Continue oxygen STOP SMOKING  5. Alcohol-induced chronic pancreatitis (HCC) Avoid all alcohol  6. Gastroesophageal reflux disease with esophagitis without hemorrhage Avoid spicy foods Do not eat 2 hours prior to bedtime - famotidine (PEPCID) 20 MG tablet; Take 1 tablet (20 mg total) by mouth every morning. Reported on 04/16/2015  Dispense: 30 tablet; Refill: 5  7. Insulin dependent type 2 diabetes mellitus (HCC) Wtahc carbs in diet - Bayer DCA Hb A1c Waived - insulin glargine (LANTUS) 100 UNIT/ML injection; Inject 0.2 mLs (20 Units total) into the skin daily.  Dispense: 30 mL; Refill: 5 - empagliflozin (JARDIANCE) 10 MG TABS tablet; Take 1 tablet (10 mg total) by mouth daily before breakfast.  Dispense: 30 tablet; Refill: 11  8. Hyperkalemia Labs oending  9. Hyponatremia Labs pending  10. Chronic midline low back pain without sciatica Referral to specialist - HYDROcodone-acetaminophen ([place]) 5-325 MG tablet; Take 1 tablet by mouth in the morning and at bedtime.  Dispense: 60 tablet; Refill: 0 - Ambulatory referral to Orthopedic Surgery  11. Mild renal insufficiency Labs pending  12. Mild peripheral edema Elevate legs when sitting - furosemide (LASIX) 40 MG tablet; Take 1 tablet (40 mg total) by mouth daily as needed (weight gain of 3 lbs overnight or 5 lbs in a week).  Dispense: 30 tablet; Refill: 3   Labs pending Health Maintenance reviewed Diet and exercise encouraged  Follow up plan: 3 months   [name] Hassell Done, FNP

## 2021-10-30 NOTE — Patient Instructions (Signed)
Peripheral Edema  Peripheral edema is swelling that is caused by a buildup of fluid. Peripheral edema most often affects the lower legs, ankles, and feet. It can also develop in the arms, hands, and face. The area of the body that has peripheral edema will look swollen. It may also feel heavy or warm. Your clothes may start to feel tight. Pressing on the area may make a temporary dent in your skin (pitting edema). You may not be able to move your swollen arm or leg as much as usual. There are many causes of peripheral edema. It can happen because of a complication of other conditions such as heart failure, kidney disease, or a problem with your circulation. It also can be a side effect of certain medicines or happen because of an infection. It often happens to women during pregnancy. Sometimes, the cause is not known. Follow these instructions at home: Managing pain, stiffness, and swelling  Raise (elevate) your legs while you are sitting or lying down. Move around often to prevent stiffness and to reduce swelling. Do not sit or stand for long periods of time. Do not wear tight clothing. Do not wear garters on your upper legs. Exercise your legs to get your circulation going. This helps to move the fluid back into your blood vessels, and it may help the swelling go down. Wear compression stockings as told by your health care provider. These stockings help to prevent blood clots and reduce swelling in your legs. It is important that these are the correct size. These stockings should be prescribed by your doctor to prevent possible injuries. If elastic bandages or wraps are recommended, use them as told by your health care provider. Medicines Take over-the-counter and prescription medicines only as told by your health care provider. Your health care provider may prescribe medicine to help your body get rid of excess water (diuretic). Take this medicine if you are told to take it. General  instructions Eat a low-salt (low-sodium) diet as told by your health care provider. Sometimes, eating less salt may reduce swelling. Pay attention to any changes in your symptoms. Moisturize your skin daily to help prevent skin from cracking and draining. Keep all follow-up visits. This is important. Contact a health care provider if: You have a fever. You have swelling in only one leg. You have increased swelling, redness, or pain in one or both of your legs. You have drainage or sores at the area where you have edema. Get help right away if: You have edema that starts suddenly or is getting worse, especially if you are pregnant or have a medical condition. You develop shortness of breath, especially when you are lying down. You have pain in your chest or abdomen. You feel weak. You feel like you will faint. These symptoms may be an emergency. Get help right away. Call 911. Do not wait to see if the symptoms will go away. Do not drive yourself to the hospital. Summary Peripheral edema is swelling that is caused by a buildup of fluid. Peripheral edema most often affects the lower legs, ankles, and feet. Move around often to prevent stiffness and to reduce swelling. Do not sit or stand for long periods of time. Pay attention to any changes in your symptoms. Contact a health care provider if you have edema that starts suddenly or is getting worse, especially if you are pregnant or have a medical condition. Get help right away if you develop shortness of breath, especially when lying down.   This information is not intended to replace advice given to you by your health care provider. Make sure you discuss any questions you have with your health care provider. Document Revised: 10/28/2020 Document Reviewed: 10/28/2020 Elsevier Patient Education  2023 [employer]  

## 2021-10-31 LAB — CBC WITH DIFFERENTIAL/PLATELET
Basophils Absolute: 0.1 10*3/uL (ref 0.0–0.2)
Basos: 1 %
EOS (ABSOLUTE): 0.3 10*3/uL (ref 0.0–0.4)
Eos: 3 %
Hematocrit: 42 % (ref 37.5–51.0)
Hemoglobin: 14 g/dL (ref 13.0–17.7)
Immature Grans (Abs): 0 10*3/uL (ref 0.0–0.1)
Immature Granulocytes: 0 %
Lymphocytes Absolute: 2 10*3/uL (ref 0.7–3.1)
Lymphs: 24 %
MCH: 32.9 pg (ref 26.6–33.0)
MCHC: 33.3 g/dL (ref 31.5–35.7)
MCV: 99 fL — ABNORMAL HIGH (ref 79–97)
Monocytes Absolute: 1 10*3/uL — ABNORMAL HIGH (ref 0.1–0.9)
Monocytes: 12 %
Neutrophils Absolute: 4.7 10*3/uL (ref 1.4–7.0)
Neutrophils: 60 %
Platelets: 241 10*3/uL (ref 150–450)
RBC: 4.25 x10E6/uL (ref 4.14–5.80)
RDW: 15.1 % (ref 11.6–15.4)
WBC: 8 10*3/uL (ref 3.4–10.8)

## 2021-10-31 LAB — LIPID PANEL
Chol/HDL Ratio: 2.8 ratio (ref 0.0–5.0)
Cholesterol, Total: 111 mg/dL (ref 100–199)
HDL: 39 mg/dL — ABNORMAL LOW (ref >39)
LDL Chol Calc (NIH): 45 mg/dL (ref 0–99)
Triglycerides: 162 mg/dL — ABNORMAL HIGH (ref 0–149)
VLDL Cholesterol Cal: 27 mg/dL (ref 5–40)

## 2021-10-31 LAB — CMP14+EGFR
ALT: 9 IU/L (ref 0–44)
AST: 14 IU/L (ref 0–40)
Albumin/Globulin Ratio: 1.2 (ref 1.2–2.2)
Albumin: 3.9 g/dL (ref 3.9–4.9)
Alkaline Phosphatase: 74 IU/L (ref 44–121)
BUN/Creatinine Ratio: 15 (ref 10–24)
BUN: 19 mg/dL (ref 8–27)
Bilirubin Total: 0.7 mg/dL (ref 0.0–1.2)
CO2: 25 mmol/L (ref 20–29)
Calcium: 9.3 mg/dL (ref 8.6–10.2)
Chloride: 97 mmol/L (ref 96–106)
Creatinine, Ser: 1.25 mg/dL (ref 0.76–1.27)
Globulin, Total: 3.2 g/dL (ref 1.5–4.5)
Glucose: 78 mg/dL (ref 70–99)
Potassium: 4.5 mmol/L (ref 3.5–5.2)
Sodium: 137 mmol/L (ref 134–144)
Total Protein: 7.1 g/dL (ref 6.0–8.5)
eGFR: 64 mL/min/{1.73_m2} (ref >59)

## 2021-10-31 NOTE — Telephone Encounter (Signed)
Pt will notify if he would like to proceed with admission

## 2021-11-04 ENCOUNTER — Encounter: Payer: BC Managed Care – PPO | Admitting: Pulmonary Disease

## 2021-11-04 ENCOUNTER — Encounter: Payer: Self-pay | Admitting: Emergency Medicine

## 2021-11-04 ENCOUNTER — Ambulatory Visit: Payer: BC Managed Care – PPO | Admitting: Emergency Medicine

## 2021-11-04 DIAGNOSIS — R911 Solitary pulmonary nodule: Secondary | ICD-10-CM

## 2021-11-04 DIAGNOSIS — E161 Other hypoglycemia: Secondary | ICD-10-CM | POA: Diagnosis not present

## 2021-11-04 DIAGNOSIS — R55 Syncope and collapse: Secondary | ICD-10-CM | POA: Diagnosis not present

## 2021-11-04 DIAGNOSIS — R739 Hyperglycemia, unspecified: Secondary | ICD-10-CM | POA: Diagnosis not present

## 2021-11-04 DIAGNOSIS — Z006 Encounter for examination for normal comparison and control in clinical research program: Secondary | ICD-10-CM

## 2021-11-04 DIAGNOSIS — E162 Hypoglycemia, unspecified: Secondary | ICD-10-CM | POA: Diagnosis not present

## 2021-11-04 DIAGNOSIS — J449 Chronic obstructive pulmonary disease, unspecified: Secondary | ICD-10-CM | POA: Diagnosis not present

## 2021-11-04 MED ORDER — AZITHROMYCIN 250 MG PO TABS: 250.0000 mg | ORAL_TABLET | Freq: Every day | ORAL | 0 refills | Status: DC

## 2021-11-04 NOTE — Assessment & Plan Note (Signed)
Severe COPD with chronic bronchitic symptoms.  He ultimately needs pulmonary function testing.  I like to try to get his waxing and waning symptoms under better control first.  He is on Trelegy, does believe that has benefited from the initiation of DuoNeb which he is using frequently.  Important intervention will be smoking cessation but this will be the long-term goal.  We will try starting azithromycin.  He may ultimately require daily prednisone given the severity of his disease.  Please continue Trelegy 1 inhalation once daily.  Rinse and gargle after using. Keep your albuterol available to use 2 puffs up to every 4 hours if needed for shortness of breath, chest tightness, wheezing. Use your DuoNeb up to every 6 hours if you needed for shortness of breath, chest tightness, wheezing. Start azithromycin 250 mg once daily.  We will stay on this medication long-term and see how it impacts your breathing and mucus production.   You need to work hard on decreasing your smoking.  Try to cut down to 1.5 packs daily by our next visit. Follow with Dr. Lamonte Sakai in September after your CT chest so we can review the results together.

## 2021-11-04 NOTE — Progress Notes (Signed)
Subjective:    Patient ID: Thomas Medici., male    DOB: Jul 08, 1955, 66 y.o.   MRN: [id number]  HPI 66 year old man, heavy smoker 2 packs a day (60 pack years) with a history of associated COPD and chronic bronchitic symptoms, hypertension, diabetes, a fibrillation/flutter, mitral valve prolapse, bladder cancer, PVD, PUD with prior ulcer perforation 2016. He is referred today for COPD, sputum production, abnormal CT scan of the chest.  He is apparently been treated for flares/pneumonia multiple times over the last 3 to 4 months.  His most recent hospitalization was in June 2023 for community-acquired versus aspiration pneumonia and associated sepsis, hypoxemic respiratory failure. Currently managed on Trelegy, Zyrtec.  Has albuterol and DuoNeb and uses about 3-4x a day.  He has daily cough, yellow mucous. He is SOB with exertion.   CT chest performed 08/17/2021 reviewed by me shows bilateral patchy groundglass airspace disease with right lower lobe consolidation consistent with pneumonia.  Also scattered nodular opacities largest 1.7 cm left upper lobe, question infectious versus malignancy.   Review of Systems As per HPI  Past Medical History:  Diagnosis Date   Arthritis    Bladder cancer ([place])    Chronic back pain    Chronic combined systolic and diastolic CHF (congestive heart failure) ([place]) 07/16/2021   COPD (chronic obstructive pulmonary disease) (HCC)    DDD (degenerative disc disease)    Diabetic retinopathy of both eyes (HCC)    Essential hypertension    GERD (gastroesophageal reflux disease)    History of atrial flutter 02/2011   Converted to NSR with Cardizem   History of chronic bronchitis    History of hemolytic anemia 02/2011   secondary to Avelox   HOH (hard of hearing)    HOH (hard of hearing)    no eardrum and nerve damage on R, also HOH on L   Mitral valve prolapse    a. 2D Echo 11/27/14: EF 55-60%; images were inadequate for LV wall motion assessment, + mild  late systolic mitral valve prolapse involving the anterior leaflet.   PAD (peripheral artery disease) ([place]) 04/2014   Dr Trula Slade; bilateral SFA occlusion, R mid, L distal   PAF (paroxysmal atrial fibrillation) ([place])    a. Dx 11/2014 during admission for perf ulcer.   Perforated ulcer ([place])    a. 11/2014 s/p surgery.   Productive cough    Smokers' cough ([place])    Type 1 diabetes mellitus ([place]) 1977     Family History  Problem Relation Age of Onset   Breast cancer Mother    Cancer Mother        Breast   Rheumatic fever Father    Heart disease Father    Heart attack Father        Massive    Diabetes Son      Social History   Socioeconomic History   Marital status: Married    Spouse name: Not on file   Number of children: Not on file   Years of education: Not on file   Highest education level: Not on file  Occupational History   Occupation: Tobacco Farmer  Tobacco Use   Smoking status: Every Day    Packs/day: 2.00    Years: 30.00    Total pack years: 60.00    Types: Cigarettes   Smokeless tobacco: Former    Quit date: 06/08/1978   Tobacco comments:    Couple packs per day 11/04/21  Vaping Use   Vaping Use: Never  used  Substance and Sexual Activity   Alcohol use: Not Currently    Comment: 5 quarts per week   Drug use: No   Sexual activity: Not on file  Other Topics Concern   Not on file  Social History Narrative   Lives in [place] with wife and 2 sons.    Social Determinants of Health   Financial Resource Strain: Not on file  Food Insecurity: Not on file  Transportation Needs: Not on file  Physical Activity: Not on file  Stress: Not on file  Social Connections: Not on file  Intimate Partner Violence: Not on file     Allergies  Allergen Reactions   Azithromycin Other (See Comments) and Nausea And Vomiting    "Severe stomach cramps; told to list as an allergy by dr. Huel Cote ago"   Avelox [Moxifloxacin Hcl In Nacl] Other (See Comments)    Hemolysis  In 2012    Bactrim [Sulfamethoxazole-Trimethoprim] Diarrhea and Nausea And Vomiting     Outpatient Medications Prior to Visit  Medication Sig Dispense Refill   acetaminophen (TYLENOL) 500 MG tablet Take 500 mg by mouth every 6 (six) hours as needed for moderate pain or headache.     albuterol (VENTOLIN HFA) 108 (90 Base) MCG/ACT inhaler Inhale 2 puffs into the lungs every 6 (six) hours as needed for wheezing or shortness of breath. 18 g 2   cetirizine (ZYRTEC) 10 MG tablet Take 10 mg by mouth daily.     empagliflozin (JARDIANCE) 10 MG TABS tablet Take 1 tablet (10 mg total) by mouth daily before breakfast. 30 tablet 11   famotidine (PEPCID) 20 MG tablet Take 1 tablet (20 mg total) by mouth every morning. Reported on 04/16/2015 30 tablet 5   Fluticasone-Umeclidin-Vilant (TRELEGY ELLIPTA) 100-62.5-25 MCG/ACT AEPB INHALE 1 PUFF INTO THE LUNGS TWICE A DAY (Patient taking differently: Inhale 1 puff into the lungs daily. INHALE 1 PUFF INTO THE LUNGS TWICE A DAY) 60 each 5   furosemide (LASIX) 40 MG tablet Take 1 tablet (40 mg total) by mouth daily as needed (weight gain of 3 lbs overnight or 5 lbs in a week). 30 tablet 3   HYDROcodone-acetaminophen (NORCO) 5-325 MG tablet Take 1 tablet by mouth in the morning and at bedtime. 60 tablet 0   insulin glargine (LANTUS) 100 UNIT/ML injection Inject 0.2 mLs (20 Units total) into the skin daily. 30 mL 5   Insulin Syringe-Needle U-100 (B-D INS SYR ULTRAFINE 1CC/30G) 30G X 1/2" 1 ML MISC Use 4 times a day with insulin Dx E11.9 400 each 3   Insulin Syringes, Disposable, U-100 1 ML MISC Use 4 times a day for insulin injection Dx E11.9 400 each 5   ipratropium-albuterol (DUONEB) 0.5-2.5 (3) MG/3ML SOLN Take 3 mLs by nebulization every 4 (four) hours as needed. 360 mL 1   levalbuterol (XOPENEX) 0.31 MG/3ML nebulizer solution Take 3 mLs (0.31 mg total) by nebulization every 4 (four) hours as needed for wheezing. 3 mL 12   losartan (COZAAR) 50 MG tablet Take 1 tablet (50 mg total)  by mouth at bedtime. hold this medication until follow up     metoprolol succinate (TOPROL-XL) 100 MG 24 hr tablet Take 1 tablet (100 mg total) by mouth daily. Take with or immediately following a meal. 90 tablet 3   NOVOLOG 100 UNIT/ML injection PER SLIDING SCALE: 190 - 200 = 2 UNITS. 300 AND ABOVE = 7 UNITS. 10 mL 1   OVER THE COUNTER MEDICATION Take 1 Scoop by mouth  in the morning and at bedtime. Super Beets     rivaroxaban (XARELTO) 20 MG TABS tablet Take 1 tablet (20 mg total) by mouth daily with supper. 30 tablet 5   No facility-administered medications prior to visit.         Objective:   Physical Exam  Vitals:   11/04/21 1502  BP: 130/70  Pulse: 72  Temp: 98.3 F (36.8 C)  TempSrc: Oral  SpO2: 99%  Weight: 175 lb 9.6 oz (79.7 kg)  Height: '5\' 9"'$  (1.753 m)   Gen: Pleasant, well-nourished, in no distress,  normal affect  ENT: No lesions,  mouth clear,  oropharynx clear, no postnasal drip, very poor hearing   Neck: No JVD, no stridor  Lungs: No use of accessory muscles, coarse bilaterally with scattered rhonchi, expiratory wheeze on forced expiration  Cardiovascular: RRR, heart sounds normal, no murmur or gallops, no peripheral edema  Musculoskeletal: No deformities, no cyanosis or clubbing  Neuro: alert, awake, non focal  Skin: Warm, no lesions or rash     Assessment & Plan:  COPD with chronic bronchitis and emphysema (HCC) Severe COPD with chronic bronchitic symptoms.  He ultimately needs pulmonary function testing.  I like to try to get his waxing and waning symptoms under better control first.  He is on Trelegy, does believe that has benefited from the initiation of DuoNeb which he is using frequently.  Important intervention will be smoking cessation but this will be the long-term goal.  We will try starting azithromycin.  He may ultimately require daily prednisone given the severity of his disease.  Please continue Trelegy 1 inhalation once daily.  Rinse and  gargle after using. Keep your albuterol available to use 2 puffs up to every 4 hours if needed for shortness of breath, chest tightness, wheezing. Use your DuoNeb up to every 6 hours if you needed for shortness of breath, chest tightness, wheezing. Start azithromycin 250 mg once daily.  We will stay on this medication long-term and see how it impacts your breathing and mucus production.   You need to work hard on decreasing your smoking.  Try to cut down to 1.5 packs daily by our next visit. Follow with Dr. Lamonte Sakai in September after your CT chest so we can review the results together.  Nodule of left lung Left upper lobe pulmonary nodule, at least moderate suspicion for malignancy.  We will repeat a super D CT chest now and depending on results decide whether to pursue navigational bronchoscopy.  Discussed the options with him today.  I also discussed possible participation in [place] nasal swab restratification trial.  He wanted to defer.   Baltazar Apo, MD, PhD 11/04/2021, 3:29 PM [facility] [phone] or if no answer before 7:00PM call [phone] For any issues after 7:00PM please call eLink [phone]

## 2021-11-04 NOTE — Patient Instructions (Addendum)
Please continue Trelegy 1 inhalation once daily.  Rinse and gargle after using. Keep your albuterol available to use 2 puffs up to every 4 hours if needed for shortness of breath, chest tightness, wheezing. Use your DuoNeb up to every 6 hours if you needed for shortness of breath, chest tightness, wheezing. We will start azithromycin daily to see if tolerated.  We will repeat your CT scan of the chest in September. You need to work hard on decreasing your smoking.  Try to cut down to 1.5 packs daily by our next visit. Follow with Dr. Lamonte Sakai in September after your CT chest so we can review the results together.

## 2021-11-04 NOTE — Assessment & Plan Note (Signed)
Left upper lobe pulmonary nodule, at least moderate suspicion for malignancy.  We will repeat a super D CT chest now and depending on results decide whether to pursue navigational bronchoscopy.  Discussed the options with him today.  I also discussed possible participation in [place] nasal swab restratification trial.  He wanted to defer.

## 2021-11-04 NOTE — Addendum Note (Signed)
Addended by: Dierdre Highman on: 11/04/2021 03:37 PM   Modules accepted: Orders

## 2021-11-05 ENCOUNTER — Other Ambulatory Visit: Payer: Self-pay | Admitting: Nurse Practitioner

## 2021-11-05 DIAGNOSIS — J449 Chronic obstructive pulmonary disease, unspecified: Secondary | ICD-10-CM

## 2021-11-06 NOTE — Research (Signed)
Title: [place]: CliNIcal Utility of ManaGement of Patients witH CT and LDCT Identified Pulmonary Nodules UsinG the Percepta NasAL Swab ClassifiEr -- with Familiarization   Protocol #: [id number] Sponsor: [employer]   Protocol Revision 1 dated 01Sep2022 and confirmed current on today's visit, IRB approved Revision 1 on 29Dec2022.   Objectives:  Primary: To evaluate if use of the Percepta Nasal Swab test in the diagnostic work up of newly identified pulmonary nodules reduces the number of invasive procedures in the group classified as low-risk by the test and that are benign as compared to a control group managed without a Percepta Nasal Swab test result.                   A newly identified nodule is defined as any nodule first identified on imaging                   <90 days prior to nasal sample collection that hasn't undergone a diagnostic                    procedure for the management of their index nodule prior to enrollment.                   CT imaging includes conventional CT, LDCT, HRCT                   Benign diagnosis is defined as a specific diagnosis of a benign condition,                    radiographic resolution or stability at ? 24 months, or no cytological,                    radiological, or pathological evidence of cancer.                   Procedures will be categorized as either invasive or non-invasive in the Data                    Management Plan (DMP). Secondary: To evaluate if use of the Percepta Nasal Swab test in the diagnostic work up of newly identified pulmonary nodules increases the proportion of subjects classified as high-risk by the test and have primary lung cancer that go directly to appropriate therapy as compared to a control group managed without a Percepta Nasal Swab test result.                    Proportion of subjects that go directly to appropriate therapy is defined as those                     subjects that undergo surgery, ablative  or other appropriate therapy as the next                     step after the Percepta Nasal Swab test result without intervening non-surgical                     procedures                             a. Non-surgical procedures include diagnostic PET, but not PET for                                   staging purposes.                             b. Appropriate therapies will be defined in the CRF.                    A newly identified nodule is defined as any nodule first identified on imaging                    <90 days prior to nasal sample collection.                    Lung cancer diagnosis is defined as established by cytology or pathology, or in                     circumstances where a presumptive diagnosis of cancer led to definitive                     ablative or other appropriate therapy without pathology. Key Inclusion Criteria:  Inclusion Criteria:  Able to tolerate nasal epithelial specimen collection  Signed written Informed Consent obtained  Subject clinical history available for review by sponsor and regulatory agencies  New nodule first identified on imaging < 90 days prior to nasal sample collection (index nodule)  CT report available for index nodule  28 - 67 years of age  Current or former smoker (>100 cigarettes in a lifetime)  Pulmonary nodule ?30 mm detected by CT  Key Exclusion Criteria: Exclusion Criteria  Subject has undergone a diagnostic procedure for the management of their index nodule after the index CT and prior to enrollment  Active cancer (other than non-melanoma skin cancer)  Prior primary lung cancer (prior non-lung cancer acceptable)  Prior participation in this study (i.e., subjects may not be enrolled more than once)  Current active treatment with an investigational device or drug (patients in trial follow up period are okay if intervention phase is complete)  Patient enrolled or planned to be enrolled in another clinical trial that may influence  management of the patient's nodule  Concurrent or planned use of tools or tests for assigning lung nodule risk of malignancy (e.g., genomic or proteomic blood tests) other than clinically validated risk calculators  Clinical Research Coordinator / Research RN note : This visit is for Thomas Sandoval. Subject [id number] with DOB: [id number] on 29Aug2023 for the above protocol is an Enrollment Visit and is for purpose of research.    Subject expressed interest and consent in continuing as a study subject. Subject confirmed contact information (e.g. address, telephone, email). Subject thanked for participation in research and contribution to science.     During this visit on 29Aug2023  , the subject reviewed and signed the consent form, provided demographics, and had a nasal swab collected per the above referenced protocol. Please refer to the subject's paper source binder for further details.   The PI and Sub-I met/discussed the subject prior to consenting patient.  The sub-Investigator  Dr. Baltazar Apo was present for the consenting process.         Signed by Hollace Kinnier [phone]

## 2021-11-12 ENCOUNTER — Telehealth: Payer: Self-pay | Admitting: Nurse Practitioner

## 2021-11-12 ENCOUNTER — Encounter: Payer: BC Managed Care – PPO | Admitting: Physical Medicine and Rehabilitation

## 2021-11-12 NOTE — Telephone Encounter (Signed)
Pt called stating that he had an appt today at the pain management office that MMM referred him to. Says he went to the appt but found out that they messed up the time of his appt and he got tired of waiting and was upset with them and in a lot of pain and ended up leaving.   Pt does not want to go back there. Wants MMM to send his referral to [facility].  Please advise and call patient with update.

## 2021-11-13 ENCOUNTER — Telehealth: Payer: Self-pay | Admitting: Emergency Medicine

## 2021-11-13 ENCOUNTER — Other Ambulatory Visit: Payer: Self-pay

## 2021-11-13 DIAGNOSIS — R52 Pain, unspecified: Secondary | ICD-10-CM

## 2021-11-13 MED ORDER — DOXYCYCLINE HYCLATE 100 MG PO TABS: 100.0000 mg | ORAL_TABLET | Freq: Two times a day (BID) | ORAL | 0 refills | Status: DC

## 2021-11-13 NOTE — Telephone Encounter (Signed)
Spoke with the pt and notified of response per RB  He verbalized understanding  Nothing further needed Rx sent

## 2021-11-13 NOTE — Telephone Encounter (Signed)
Called and spoke with patient.  Patient stated his cough is not getting better and he is having yellow sputum. Patient stated he is having increased sob.  Patient denies any fever or chills.  Patient stated he has been taking OTC Robitussin, but has little relief.  Patient stated he is taking azithromycin daily, but does not think it is helping.  Patient is  requesting stronger antibiotic to be sent to [place].    Message routed to Dr. Lamonte Sakai to advise

## 2021-11-13 NOTE — Telephone Encounter (Signed)
Please redo referral fo [facility]. Tell patient that he has to do pain management, so he need s to be patient or he will not get in.

## 2021-11-13 NOTE — Telephone Encounter (Signed)
Referral placed and patient notified. Patient verbalized understanding

## 2021-11-13 NOTE — Telephone Encounter (Signed)
Have him temporarily stop the azithromycin until he completes the below Start doxycycline 100 mg twice a day for 7 days He needs to work hard to decrease his smoking as this is a contributor.

## 2021-11-15 ENCOUNTER — Ambulatory Visit (HOSPITAL_COMMUNITY): Payer: BC Managed Care – PPO

## 2021-11-17 ENCOUNTER — Encounter
Payer: BC Managed Care – PPO | Attending: Physical Medicine and Rehabilitation | Admitting: Physical Medicine and Rehabilitation

## 2021-11-17 ENCOUNTER — Telehealth: Payer: Self-pay | Admitting: Nurse Practitioner

## 2021-11-17 DIAGNOSIS — E119 Type 2 diabetes mellitus without complications: Secondary | ICD-10-CM | POA: Diagnosis not present

## 2021-11-17 DIAGNOSIS — Z794 Long term (current) use of insulin: Secondary | ICD-10-CM | POA: Diagnosis not present

## 2021-11-17 DIAGNOSIS — M545 Low back pain, unspecified: Secondary | ICD-10-CM | POA: Diagnosis not present

## 2021-11-17 DIAGNOSIS — Z5181 Encounter for therapeutic drug level monitoring: Secondary | ICD-10-CM | POA: Diagnosis not present

## 2021-11-17 DIAGNOSIS — G8929 Other chronic pain: Secondary | ICD-10-CM | POA: Diagnosis not present

## 2021-11-17 DIAGNOSIS — Z79891 Long term (current) use of opiate analgesic: Secondary | ICD-10-CM

## 2021-11-17 MED ORDER — NALOXONE HCL 4 MG/0.1ML NA LIQD: 1.0000 | NASAL | 2 refills | Status: DC | PRN

## 2021-11-17 NOTE — Patient Instructions (Signed)
Once your urine screen results, your Norco 5 mg will be refilled starting on your next refill date 9/18. You will also be prescribed tramadol at that time for first-use, and continue norco for breakthrough pain only.  You have been given a script for physical therapy to work on your low back.  You have been given a script for xrays to be done at [facility] will follow up in prior to your next medication refill 10/16.

## 2021-11-17 NOTE — Assessment & Plan Note (Signed)
Ordered xray 2 view lumbar spine for suspected facet arthropathy as cause of LBP. If present, will discuss with patient referral to Dr. Bonney Aid for nerve block/ ablation.   PDMP reviewed and appropriate. Controlled substance contract signed today. UDS ordered.  If UDS appropriate, plan starting 9/18 (at end of current norco script) to initiate Tramadol 25 mg BID for basal pain control (reviewed GFR today), and use Norco 5 mg BID PRN only for breakthrough pain. Hope to wean off Norco and onto Tramadol with subsequent visits.   Patient was informed at today's visit that pain medication alone without trial of adjunctive treatments is not an acceptable plan of care, and moving forward he will need to be willing to try adjunctive treatments for his pain. Patient is hesitant but understanding.  PT script for low back pain ordered today, with trial of TENs unit and abdominal strengthening for core stability.   Follow up prior to 10/16 for medication refill and to discuss PT effect, xray results.

## 2021-11-17 NOTE — Progress Notes (Signed)
Subjective:    Patient ID: Thomas Sandoval., male    DOB: January 27, 1956, 66 y.o.   MRN: [id number]  HPI  Cresencio Reesor. is a 66 y.o. year old male  who  has a past medical history of Arthritis, Bladder cancer ([place]), Chronic back pain, Chronic combined systolic and diastolic CHF (congestive heart failure) ([place]) (07/16/2021), COPD (chronic obstructive pulmonary disease) ([place]), DDD (degenerative disc disease), Diabetic retinopathy of both eyes ([place]), Essential hypertension, GERD (gastroesophageal reflux disease), History of atrial flutter (02/2011), History of chronic bronchitis, History of hemolytic anemia (02/2011), HOH (hard of hearing), HOH (hard of hearing), Mitral valve prolapse, PAD (peripheral artery disease) ([place]) (04/2014), PAF (paroxysmal atrial fibrillation) ([place]), Perforated ulcer ([place]), Productive cough, Smokers' cough ([place]), and Type 1 diabetes mellitus ([place]) (1977).   They are presenting to PM&R clinic as a new patient for pain management evaluation. They were referred by Duane Boston, FNP for treatment of low back pain.   Went to the spine surgeon 15+ years ago, with imaging (xray), no other workup since. He is going to orthopedics 11/27/21 but is not interested in surgery at this time.   Source: Low back  Inciting incident: None; pt runs a farm and logs, does a lot of heavy labor. Pain started 15-20 years ago, and has progressively gotten worse.  Duration of pain: Constant Description of pain: Patient states it used to shoot, but now "it just hurts". He cannot describe it further.  Severity: On average 7-8/10. At worst 10/10. At best 2-3/10. Exacerbating factors: Walking, moving, working, bending, moving.  Remitting factors: Laying down on his side.  Red flag symptoms: Patient denies saddle anesthesia, loss of bowel or bladder continence, new weakness, new numbness/tingling, or pain waking up at nighttime (pt has insomnia at baseline).    Medications  tried: Topical medications ( no effect) : Tried BenGay with no effect. Has not tried voltaren gel.  Nsaids ( mild effect): Cannot take d/t Hx gastric ulcer, GIB. Tylenol  ( mild effect): Takes every morning. Opiates  ( mild effect): takes Norco 5 mg BID PRN. It helps, but it doesn't last. Only takes 2 tabs per day, will take 1/2 tab sometimes to stretch them 4-6 hours between tabs. Patient cannot describe how much it reduces his pain. Tried Morphine in the past but "it really messed me up".  Gabapentin / Lyrica: never tried.  TCAs  ( no effect): Takes amytriptilline for migraines, they make him tired and loopy.  SNRIs : Never tried.  Other: Never tried tramadol. Has tried tylenol with codeine for migraines, which helped for that but not other pains.   Other treatments: PT/OT: never tried.  Accupuncture/chiropractor/massage: Tried chiropractor in the past, helped "for a day or two".  TENs unit: Never tried.  Injections: Never had; "I doubt I ever will." Patient doesn't want to try "because the people I've seen get it done, it doesn't work". He states "I don't want to try it".  Surgery: Patient has never tried, and states he is not interested.  Other: Has a tilt table, stopped using because "it was too much trouble." Has used alcohol in the past for pain "when it's just too much". Denies current alcohol use. Historical intakes ~1 gallon liquor per week.   Goals for pain control: To improve his ability to walk and tolerate his heavy labor work on the farm.   Of note, pt's wife endorses frequent hypoglycemic episodes where BG 20-60, requiring EMS services. States this has  occurred 4x since April. No adjustment in diabetes regimen. Checks BG frequently, last HA1C 5.8.   Pain Inventory Average Pain 8 Pain Right Now 8 My pain is constant  In the last 24 hours, has pain interfered with the following? General activity 9 Relation with others 2 Enjoyment of life 3 What TIME of day is your pain at  its worst? morning , daytime, evening, and night Sleep (in general) Fair  Pain is worse with: walking, bending, sitting, inactivity, standing, and some activites Pain improves with: rest and medication Relief from Meds: 5  walk without assistance how many minutes can you walk? 5-10 ability to climb steps?  yes do you drive?  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  no  Any changes since last visit?  no    Family History  Problem Relation Age of Onset   Breast cancer Mother    Cancer Mother        Breast   Rheumatic fever Father    Heart disease Father    Heart attack Father        Massive    Diabetes Son    Social History   Socioeconomic History   Marital status: Married    Spouse name: Not on file   Number of children: Not on file   Years of education: Not on file   Highest education level: Not on file  Occupational History   Occupation: Tobacco Psychologist, sport and exercise  Tobacco Use   Smoking status: Every Day    Packs/day: 2.00    Years: 30.00    Total pack years: 60.00    Types: Cigarettes   Smokeless tobacco: Former    Quit date: 06/08/1978   Tobacco comments:    Couple packs per day 11/04/21  Vaping Use   Vaping Use: Never used  Substance and Sexual Activity   Alcohol use: Not Currently    Comment: 5 quarts per week   Drug use: No   Sexual activity: Not on file  Other Topics Concern   Not on file  Social History Narrative   Lives in [place] with wife and 2 sons.    Social Determinants of Health   Financial Resource Strain: Not on file  Food Insecurity: Not on file  Transportation Needs: Not on file  Physical Activity: Not on file  Stress: Not on file  Social Connections: Not on file   Past Surgical History:  Procedure Laterality Date   CARDIOVERSION N/A 01/20/2021   Procedure: CARDIOVERSION;  Surgeon: Fay Records, MD;  Location: [facility];  Service: Cardiovascular;  Laterality: N/A;   CYSTOSCOPY WITH URETEROSCOPY  Right 08/14/2013   Procedure: CYSTOSCOPY WITH URETEROSCOPY BLADDER BIOPSY ;  Surgeon: Claybon Jabs, MD;  Location[facility];  Service: Urology;  Laterality: Right;   ESOPHAGOGASTRODUODENOSCOPY N/A 02/06/2013   Procedure: ESOPHAGOGASTRODUODENOSCOPY (EGD);  Surgeon: Lafayette Dragon, MD;  Location: [facility] ENDOSCOPY;  Service: Endoscopy;  Laterality: N/A;   LAPAROSCOPY N/A 11/25/2014   Procedure: LAPAROSCOPIC PRIMARY REPAIR OF PERFORATED PREPYLORIC ULCER WITH Silvestre Gunner;  Surgeon: Greer Pickerel, MD;  Location: [place];  Service: General;  Laterality: N/A;   TONSILLECTOMY  as child   TRANSTHORACIC ECHOCARDIOGRAM  02-17-2011   MODERATE LVH/  EF 65%   TRANSURETHRAL RESECTION OF BLADDER TUMOR WITH GYRUS (TURBT-GYRUS) N/A 06/12/2013   Procedure: TRANSURETHRAL RESECTION OF BLADDER TUMOR WITH GYRUS (TURBT-GYRUS);  Surgeon: Claybon Jabs, MD;  Location: [facility];  Service: Urology;  Laterality: N/A;   TYMPANIC MEMBRANE REPAIR  as child   Past Medical History:  Diagnosis Date   Arthritis    Bladder cancer ([place])    Chronic back pain    Chronic combined systolic and diastolic CHF (congestive heart failure) ([place]) 07/16/2021   COPD (chronic obstructive pulmonary disease) ([facility])    DDD (degenerative disc disease)    Diabetic retinopathy of both eyes ([facility])    Essential hypertension    GERD (gastroesophageal reflux disease)    History of atrial flutter 02/2011   Converted to NSR with Cardizem   History of chronic bronchitis    History of hemolytic anemia 02/2011   secondary to Avelox   HOH (hard of hearing)    HOH (hard of hearing)    no eardrum and nerve damage on R, also HOH on L   Mitral valve prolapse    a. 2D Echo 11/27/14: EF 55-60%; images were inadequate for LV wall motion assessment, + mild late systolic mitral valve prolapse involving the anterior leaflet.   PAD (peripheral artery disease) ([place]) 04/2014   Dr Trula Slade; bilateral SFA occlusion, R mid, L distal   PAF  (paroxysmal atrial fibrillation) ([place])    a. Dx 11/2014 during admission for perf ulcer.   Perforated ulcer ([place])    a. 11/2014 s/p surgery.   Productive cough    Smokers' cough ([place])    Type 1 diabetes mellitus ([place]) 1977   There were no vitals taken for this visit.  Opioid Risk Score:   Fall Risk Score:  `1  Depression screen PHQ 2/9     11/17/2021    9:30 AM 04/28/2021    2:17 PM 01/24/2021    2:25 PM 12/17/2020    2:28 PM 10/25/2020    2:45 PM 05/01/2020    2:23 PM 01/30/2020    2:15 PM  Depression screen PHQ 2/9  Decreased Interest 0 0 0 0 0 0 0  Down, Depressed, Hopeless 0 0 0 0 0 0 0  PHQ - 2 Score 0 0 0 0 0 0 0  Altered sleeping 1 0  0 0    Tired, decreased energy 1 0  0 0    Change in appetite 0 0  0 0    Feeling bad or failure about yourself  0 0  0 0    Trouble concentrating 0 0  0 0    Moving slowly or fidgety/restless 0 0  0 0    Suicidal thoughts 0 0  0 0    PHQ-9 Score 2 0  0 0    Difficult doing work/chores  Not difficult at all   Not difficult at all      Review of Systems  Constitutional: Negative.   HENT:  Positive for hearing loss.   Eyes: Negative.   Respiratory:  Positive for cough and shortness of breath.        Had pneumonia twice - may and June   Cardiovascular: Negative.   Gastrointestinal: Negative.   Endocrine:       High and low blood sugars  Genitourinary: Negative.   Musculoskeletal:  Positive for back pain and gait problem.  Skin: Negative.   Allergic/Immunologic: Negative.   Hematological:  Bruises/bleeds easily.       Xarelto  Psychiatric/Behavioral: Negative.    All other systems reviewed and are negative.      Objective:   Physical Exam  Constitution: Appropriate appearance for age. No apparnet distress  HEENT: PERRL, EOMI grossly intact.  Resp: CTAB. No rales, rhonchi, or wheezing. Cardio: RRR. No mumurs, rubs, or gallops. No peripheral edema. Abdomen: Nondistended. Nontender. +bowel sounds. Psych: Appropriate mood and  affect; regarding pain management discussion, attempts to get up and leave room x1 due to frustration.  Neuro: AAOx4. No apparent deficits.5/5 strength in bilateral UE and LE in all dermatomes. Reflexes 2+ throughout.   Back Exam:   Inspection: Pelvis was symmetric.  Lumbar lordotic curvature was mild dextroscoliosis.  Palpation: Palpatory exam was nontender over thoracolumbar spinous processes, paraspinal muscles, QL/psoas, PSIS, SI joints, ischial tuberosities or greater trochanters . There was no evidence of spasm. No trigger points were noted.     ROM:  Flexion wnl,  Extension limited by pain  Special/provocative testing:    SLR: + LBP, no radiation   Slump test: negative   Facet loading: + Extreme LBP, bilateral    TTP at paraspinals: none   Forward bending: negative  Gait: Forward leaning, antalgic. No trendelenberg.     Assessment & Plan:   Jacari Iannello. is a 66 y.o. year old male presenting to PM&R clinic as a new patient for pain management evaluation. They were referred by Duane Boston, FNP for treatment of low back pain. Pain has been poorly controlled on Oxycodone 5 mg BID PRN; no other interventions tried in recent history, however patient is cautious of interventions and does not believe he can perform ADLs or continue working without use of narcotic pain medication.   Chronic bilateral low back pain without sciatica Assessment & Plan: Ordered xray 2 view lumbar spine for suspected facet arthropathy as cause of LBP. If present, will discuss with patient referral to Dr. Bonney Aid for nerve block/ ablation.   PDMP reviewed and appropriate. Controlled substance contract signed today. UDS ordered.  If UDS appropriate, plan starting 9/18 (at end of current norco script) to initiate Tramadol 25 mg BID for basal pain control (reviewed GFR today), and use Norco 5 mg BID PRN only for breakthrough pain. Hope to wean off Norco and onto Tramadol with subsequent visits.    Patient was informed at today's visit that pain medication alone without trial of adjunctive treatments is not an acceptable plan of care, and moving forward he will need to be willing to try adjunctive treatments for his pain. Patient is hesitant but understanding.  PT script for low back pain ordered today, with trial of TENs unit and abdominal strengthening for core stability.   Follow up prior to 10/16 for medication refill and to discuss PT effect, xray results.     Orders: -     Ambulatory referral to Physical Therapy -     DG Lumbar Spine 2-3 Views; Future -     ToxAssure Select,+Antidepr,UR  Insulin dependent type 2 diabetes mellitus ([place]) Assessment & Plan: Encouraged patient to discuss frequent hypoglycemia with PCP. Cautioned that consistent lows <60 are dangerous, and while HA1C is well controlled, more liberal goals are warranted to avoid lows in his age group. Patient and wife understanding.   Advised wife in events of narcosis/patient unable to be aroused, should call EMS and also administer Narcan to counter effect of pain medication on respiratory depression. Wife understanding.    Encounter for therapeutic drug monitoring -     ToxAssure Select,+Antidepr,UR  Admission for long-term opiate analgesic use -     ToxAssure Select,+Antidepr,UR  Other orders -     Naloxone HCl; Place 1 spray into the nose as  needed (For opiate overdose).  Dispense: 1 each; Refill: [place], DO 11/17/2021

## 2021-11-17 NOTE — Telephone Encounter (Signed)
Pt wife states he has been taking '75mg'$  of metoprolol and wsa confused when she got '100mg'$  from pharmacy today- I let pt know he should have been taking '100mg'$  since at least may according to our records

## 2021-11-17 NOTE — Assessment & Plan Note (Signed)
Encouraged patient to discuss frequent hypoglycemia with PCP. Cautioned that consistent lows <60 are dangerous, and while HA1C is well controlled, more liberal goals are warranted to avoid lows in his age group. Patient and wife understanding.   Advised wife in events of narcosis/patient unable to be aroused, should call EMS and also administer Narcan to counter effect of pain medication on respiratory depression. Wife understanding.

## 2021-11-18 ENCOUNTER — Ambulatory Visit: Payer: BC Managed Care – PPO | Admitting: Physical Medicine and Rehabilitation

## 2021-11-19 ENCOUNTER — Ambulatory Visit: Payer: BC Managed Care – PPO | Attending: Cardiology | Admitting: Cardiology

## 2021-11-19 ENCOUNTER — Encounter: Payer: Self-pay | Admitting: Cardiology

## 2021-11-19 VITALS — BP 110/58 | HR 72 | Ht 69.0 in | Wt 180.2 lb

## 2021-11-19 DIAGNOSIS — Z720 Tobacco use: Secondary | ICD-10-CM | POA: Diagnosis not present

## 2021-11-19 DIAGNOSIS — J9601 Acute respiratory failure with hypoxia: Secondary | ICD-10-CM | POA: Diagnosis not present

## 2021-11-19 DIAGNOSIS — I5042 Chronic combined systolic (congestive) and diastolic (congestive) heart failure: Secondary | ICD-10-CM

## 2021-11-19 DIAGNOSIS — F1010 Alcohol abuse, uncomplicated: Secondary | ICD-10-CM

## 2021-11-19 DIAGNOSIS — I10 Essential (primary) hypertension: Secondary | ICD-10-CM | POA: Diagnosis not present

## 2021-11-19 DIAGNOSIS — I4819 Other persistent atrial fibrillation: Secondary | ICD-10-CM

## 2021-11-19 MED ORDER — FUROSEMIDE 40 MG PO TABS: 20.0000 mg | ORAL_TABLET | Freq: Every day | ORAL | 3 refills | Status: DC

## 2021-11-19 NOTE — Progress Notes (Signed)
Cardiology Office Note:    Date:  11/19/2021   ID:  Thomas Medici., DOB 1955-12-18, MRN [id number]  PCP:  Chevis Pretty, FNP  Cardiologist:  Donato Heinz, MD  Electrophysiologist:  Vickie Epley, MD   Referring MD: Hassell Done, [name], *   No chief complaint on file.   History of Present Illness:    Thomas Sandoval. is a 66 y.o. male with a hx of alcohol abuse, tobacco abuse, COPD, perforated gastric ulcer status postrepair in 2016, T2DM, hypertension, PAD, MVP, atrial fibrillation/flutter, systolic heart failure who presents for follow-up.  He was hospitalized September 2022 with acute pancreatitis found to be in A. fib.  Echo at that time showed EF 30 to 35%.  He left AMA.  At follow-up with his PCP 12/17/2020 he was still in A. fib and started on Xarelto and diltiazem.  He was referred to A. fib clinic, seen on 12/25/2020.  He underwent cardioversion on 01/20/2021 with successful restoration of sinus rhythm.  Echocardiogram 08/26/2017 showed EF 55 to 60%, mild mitral valve prolapse.  Echo 12/05/2020 showed EF 30 to 35%, global hypokinesis, and low normal RV function, moderate left atrial enlargement, trivial MR.  Zio patch x3 days on 02/10/2021 showed high percent A. fib burden with average rate 95 bpm.  Echocardiogram 05/21/2021 showed EF 55 to 09%, grade 2 diastolic dysfunction, normal RV function, no significant valvular disease.  Since last clinic visit, he reports he is doing okay.  Denies any chest pain.  No palpitations.  Reports he continues to have shortness of breath since last episode of pneumonia.  Has noted some lower extremity edema, started taking Lasix 20 mg daily.  He is started drinking again.  Continues to smoke 1 pack/day    Past Medical History:  Diagnosis Date   Arthritis    Bladder cancer ([place])    Chronic back pain    Chronic combined systolic and diastolic CHF (congestive heart failure) ([place]) 07/16/2021   COPD (chronic  obstructive pulmonary disease) (H[facility])    DDD (degenerative disc disease)    Diabetic retinopathy of both eyes ([facility])    Essential hypertension    GERD (gastroesophageal reflux disease)    History of atrial flutter 02/2011   Converted to NSR with Cardizem   History of chronic bronchitis    History of hemolytic anemia 02/2011   secondary to Avelox   HOH (hard of hearing)    HOH (hard of hearing)    no eardrum and nerve damage on R, also HOH on L   Mitral valve prolapse    a. 2D Echo 11/27/14: EF 55-60%; images were inadequate for LV wall motion assessment, + mild late systolic mitral valve prolapse involving the anterior leaflet.   PAD (peripheral artery disease) ([place]) 04/2014   Dr Trula Slade; bilateral SFA occlusion, R mid, L distal   PAF (paroxysmal atrial fibrillation) ([place])    a. Dx 11/2014 during admission for perf ulcer.   Perforated ulcer ([place])    a. 11/2014 s/p surgery.   Productive cough    Smokers' cough ([place])    Type 1 diabetes mellitus ([place]) 1977    Past Surgical History:  Procedure Laterality Date   CARDIOVERSION N/A 01/20/2021   Procedure: CARDIOVERSION;  Surgeon: Fay Records, MD;  Location: [facility];  Service: Cardiovascular;  Laterality: N/A;   CYSTOSCOPY WITH URETEROSCOPY Right 08/14/2013   Procedure: CYSTOSCOPY WITH URETEROSCOPY BLADDER BIOPSY ;  Surgeon: Claybon Jabs, MD;  Location: [facility];  Service: Urology;  Laterality: Right;   ESOPHAGOGASTRODUODENOSCOPY N/A 02/06/2013   Procedure: ESOPHAGOGASTRODUODENOSCOPY (EGD);  Surgeon: Lafayette Dragon, MD;  Location: [facility];  Service: Endoscopy;  Laterality: N/A;   LAPAROSCOPY N/A 11/25/2014   Procedure: LAPAROSCOPIC PRIMARY REPAIR OF PERFORATED PREPYLORIC ULCER WITH Silvestre Gunner;  Surgeon: Greer Pickerel, MD;  Location: [facility];  Service: General;  Laterality: N/A;   TONSILLECTOMY  as child   TRANSTHORACIC ECHOCARDIOGRAM  02-17-2011   MODERATE LVH/  EF 65%   TRANSURETHRAL RESECTION OF BLADDER TUMOR WITH  GYRUS (TURBT-GYRUS) N/A 06/12/2013   Procedure: TRANSURETHRAL RESECTION OF BLADDER TUMOR WITH GYRUS (TURBT-GYRUS);  Surgeon: Claybon Jabs, MD;  Location: [facility];  Service: Urology;  Laterality: N/A;   TYMPANIC MEMBRANE REPAIR  as child    Current Medications: Current Meds  Medication Sig   albuterol (VENTOLIN HFA) 108 (90 Base) MCG/ACT inhaler Inhale 2 puffs into the lungs every 6 (six) hours as needed for wheezing or shortness of breath.   cetirizine (ZYRTEC) 10 MG tablet Take 10 mg by mouth daily.   doxycycline (VIBRA-TABS) 100 MG tablet Take 1 tablet (100 mg total) by mouth 2 (two) times daily.   empagliflozin (JARDIANCE) 10 MG TABS tablet Take 1 tablet (10 mg total) by mouth daily before breakfast.   famotidine (PEPCID) 20 MG tablet Take 1 tablet (20 mg total) by mouth every morning. Reported on 04/16/2015   HYDROcodone-acetaminophen (NORCO) 5-325 MG tablet Take 1 tablet by mouth in the morning and at bedtime.   insulin glargine (LANTUS) 100 UNIT/ML injection Inject 0.2 mLs (20 Units total) into the skin daily.   Insulin Syringe-Needle U-100 (B-D INS SYR ULTRAFINE 1CC/30G) 30G X 1/2" 1 ML MISC Use 4 times a day with insulin Dx E11.9   Insulin Syringes, Disposable, U-100 1 ML MISC Use 4 times a day for insulin injection Dx E11.9   ipratropium-albuterol (DUONEB) 0.5-2.5 (3) MG/3ML SOLN Take 3 mLs by nebulization every 4 (four) hours as needed.   levalbuterol (XOPENEX) 0.31 MG/3ML nebulizer solution Take 3 mLs (0.31 mg total) by nebulization every 4 (four) hours as needed for wheezing.   losartan (COZAAR) 50 MG tablet Take 1 tablet (50 mg total) by mouth at bedtime. hold this medication until follow up   metoprolol succinate (TOPROL-XL) 100 MG 24 hr tablet Take 1 tablet (100 mg total) by mouth daily. Take with or immediately following a meal.   naloxone (NARCAN) nasal spray 4 mg/0.1 mL Place 1 spray into the nose as needed (For opiate overdose).   NOVOLOG 100 UNIT/ML  injection PER SLIDING SCALE: 190 - 200 = 2 UNITS. 300 AND ABOVE = 7 UNITS.   OVER THE COUNTER MEDICATION Take 1 Scoop by mouth in the morning and at bedtime. Super Beets   rivaroxaban (XARELTO) 20 MG TABS tablet Take 1 tablet (20 mg total) by mouth daily with supper.   [DISCONTINUED] furosemide (LASIX) 40 MG tablet Take 1 tablet (40 mg total) by mouth daily as needed (weight gain of 3 lbs overnight or 5 lbs in a week).     Allergies:   Azithromycin, Avelox [moxifloxacin hcl in nacl], Bactrim [sulfamethoxazole-trimethoprim], Moxifloxacin, and Sulfamethoxazole-trimethoprim   Social History   Socioeconomic History   Marital status: Married    Spouse name: Not on file   Number of children: Not on file   Years of education: Not on file   Highest education level: Not on file  Occupational History   Occupation: [employer]  Tobacco  Use   Smoking status: Every Day    Packs/day: 2.00    Years: 30.00    Total pack years: 60.00    Types: Cigarettes   Smokeless tobacco: Former    Quit date: 06/08/1978   Tobacco comments:    Couple packs per day 11/04/21  Vaping Use   Vaping Use: Never used  Substance and Sexual Activity   Alcohol use: Not Currently    Comment: 5 quarts per week   Drug use: No   Sexual activity: Not on file  Other Topics Concern   Not on file  Social History Narrative   Lives in [place] with wife and 2 sons.    Social Determinants of Health   Financial Resource Strain: Not on file  Food Insecurity: Not on file  Transportation Needs: Not on file  Physical Activity: Not on file  Stress: Not on file  Social Connections: Not on file     Family History: The patient's family history includes Breast cancer in his mother; Cancer in his mother; Diabetes in his son; Heart attack in his father; Heart disease in his father; Rheumatic fever in his father.  ROS:   Please see the history of present illness.     All other systems reviewed and are  negative.  EKGs/Labs/Other Studies Reviewed:    The following studies were reviewed today:   EKG:   11/19/2021: Normal sinus rhythm, rate 72, left axis deviation 08/20/2021: Atrial flutter with variable conduction, rate 87 05/20/21: Normal sinus rhythm, rate 61, left axis deviation, nonspecific T wave flattening, Q waves in leads III, aVF 03/14/21:NSR, ST depression<1 mm in inferior leads and V4-6, rate 61  Recent Labs: 04/09/2021: Magnesium 2.0 07/16/2021: B Natriuretic Peptide 950.1 08/17/2021: TSH 0.322 10/30/2021: ALT 9; BUN 19; Creatinine, Ser 1.25; Hemoglobin 14.0; Platelets 241; Potassium 4.5; Sodium 137  Recent Lipid Panel    Component Value Date/Time   CHOL 111 10/30/2021 1532   TRIG 162 (H) 10/30/2021 1532   HDL 39 (L) 10/30/2021 1532   CHOLHDL 2.8 10/30/2021 1532   LDLCALC 45 10/30/2021 1532    Physical Exam:    VS:  BP (!) 110/58   Pulse 72   Ht '5\' 9"'$  (1.753 m)   Wt 180 lb 3.2 oz (81.7 kg)   SpO2 97%   BMI 26.61 kg/m     Wt Readings from Last 3 Encounters:  11/19/21 180 lb 3.2 oz (81.7 kg)  11/04/21 175 lb 9.6 oz (79.7 kg)  10/30/21 171 lb (77.6 kg)     GEN:  Well nourished, well developed in no acute distress HEENT: Normal NECK: No JVD; No carotid bruits LYMPHATICS: No lymphadenopathy CARDIAC: Irregular, tachycardic, no murmurs, rubs, gallops RESPIRATORY:  Clear to auscultation without rales, wheezing or rhonchi  ABDOMEN: Soft, non-tender, non-distended MUSCULOSKELETAL: Trace edema; No deformity  SKIN: Warm and dry NEUROLOGIC:  Alert and oriented x 3 PSYCHIATRIC:  Normal affect   ASSESSMENT:    1. Persistent atrial fibrillation ([place])   2. Chronic combined systolic and diastolic CHF (congestive heart failure) ([place])   3. Essential hypertension   4. Tobacco use   5. ETOH abuse      PLAN:    Persistent atrial fibrillation/flutter: CHA2DS2-VASc score 4.  Underwent successful cardioversion on 01/20/2021, but was back in A. fib at follow-up appointment  on 01/24/2021.  Zio patch x3 days on 02/10/2021 showed 100% percent A. fib burden with average rate 95 bpm.  Subsequently converted to sinus rhythm, but was in a  flutter at clinic appointment 08/20/2021.  Currently in sinus rhythm -Continue Xarelto 20 mg daily -Continue Toprol-XL 100 mg daily -Given possible tachycardia induced cardiomyopathy, would recommend rhythm control strategy.  Referred to EP to consider ablation versus antiarrhythmic.  Follows with Dr Quentin Ore, planning Tikosyn load  Chronic combined systolic and diastolic heart failure: EF 30 to 35% on echocardiogram during admission with acute pancreatitis in September 2022.  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  Echocardiogram 05/21/2021 showed EF 55 to 60% -Continue Lasix 20 mg daily.  Check BMET, magnesium, BNP -Continue Toprol-XL 100 mg daily -Continue losartan 50 mg daily -Continue Jardiance 10 mg daily  Alcohol abuse: Counseled risk of alcohol abuse and cessation recommended.  He initially stopped drinking after admission in September 2022, but reports he has been drinking recently.  Cessation recommended.  Tobacco use: Patient counseled on the risk of tobacco use and cessation strongly encouraged  Hypertension: Continue Toprol-XL, losartan  T2DM: On insulin   RTC in 6 months   Medication Adjustments/Labs and Tests Ordered: Current medicines are reviewed at length with the patient today.  Concerns regarding medicines are outlined above.  Orders Placed This Encounter  Procedures   Basic metabolic panel   Brain natriuretic peptide   Magnesium   EKG 12-Lead    Meds ordered this encounter  Medications   furosemide (LASIX) 40 MG tablet    Sig: Take 0.5 tablets (20 mg total) by mouth daily.    Dispense:  90 tablet    Refill:  3    Patient Instructions  Medication Instructions:  Your physician recommends that you continue on your current medications as directed. Please refer to the Current Medication list given  to you today.  *If you need a refill on your cardiac medications before your next appointment, please call your pharmacy*   Lab Work: BMET, Mag, BNP today  If you have labs (blood work) drawn today and your tests are completely normal, you will receive your results only by: [place] (if you have MyChart) OR A paper copy in the mail If you have any lab test that is abnormal or we need to change your treatment, we will call you to review the results.  Follow-Up: At [facility], you and your health needs are our priority.  As part of our continuing mission to provide you with exceptional heart care, we have created designated Provider Care Teams.  These Care Teams include your primary Cardiologist (physician) and Advanced Practice Providers (APPs -  Physician Assistants and Nurse Practitioners) who all work together to provide you with the care you need, when you need it.  We recommend signing up for the patient portal called "MyChart".  Sign up information is provided on this After Visit Summary.  MyChart is used to connect with patients for Virtual Visits (Telemedicine).  Patients are able to view lab/test results, encounter notes, upcoming appointments, etc.  Non-urgent messages can be sent to your provider as well.   To learn more about what you can do with MyChart, go to [email or web address].    Your next appointment:   6 month(s)  The format for your next appointment:   In Person  Provider:   Donato Heinz, MD      Important Information About Sugar         Signed, Donato Heinz, MD  11/19/2021 1:53 PM    [place]

## 2021-11-19 NOTE — Patient Instructions (Signed)
Medication Instructions:  Your physician recommends that you continue on your current medications as directed. Please refer to the Current Medication list given to you today.  *If you need a refill on your cardiac medications before your next appointment, please call your pharmacy*   Lab Work: BMET, Mag, BNP today  If you have labs (blood work) drawn today and your tests are completely normal, you will receive your results only by: [place] (if you have MyChart) OR A paper copy in the mail If you have any lab test that is abnormal or we need to change your treatment, we will call you to review the results.  Follow-Up: At [facility], you and your health needs are our priority.  As part of our continuing mission to provide you with exceptional heart care, we have created designated Provider Care Teams.  These Care Teams include your primary Cardiologist (physician) and Advanced Practice Providers (APPs -  Physician Assistants and Nurse Practitioners) who all work together to provide you with the care you need, when you need it.  We recommend signing up for the patient portal called "MyChart".  Sign up information is provided on this After Visit Summary.  MyChart is used to connect with patients for Virtual Visits (Telemedicine).  Patients are able to view lab/test results, encounter notes, upcoming appointments, etc.  Non-urgent messages can be sent to your provider as well.   To learn more about what you can do with MyChart, go to [email or web address].    Your next appointment:   6 month(s)  The format for your next appointment:   In Person  Provider:   Donato Heinz, MD      Important Information About Sugar

## 2021-11-20 ENCOUNTER — Telehealth: Payer: Self-pay | Admitting: Physical Medicine and Rehabilitation

## 2021-11-20 DIAGNOSIS — G8929 Other chronic pain: Secondary | ICD-10-CM

## 2021-11-20 LAB — TOXASSURE SELECT,+ANTIDEPR,UR

## 2021-11-20 NOTE — Telephone Encounter (Signed)
UDS reviewed and appropriate.  Note from last visit: starting 9/18 (at end of current norco script) to initiate Tramadol 25 mg BID for basal pain control (reviewed GFR today), and use Norco 5 mg BID PRN only for breakthrough pain.  Attempted to call patient, went to VM. Given current MMEQ 10/day, reviewed the following options:   1) control basal pain with tramadol and use hydrocodone for breakthrough, as discussed last visit. This will allow for some variance on heavy labor days without exceeding overall 10/day MMEQ for the month.      - Tramadol 25 mg 1 tab BID #60 tabs R0    - Hydrocodone-acetaminophen 5 mg 1 tab BID PRN for breakthrough pain #30 tabs R0   2) Keep script as is for this coming month, and attempt change with tramadol with next appointment in October.  - Hydrocodone-acetaminophen 5 mg BID Prn for severe pain #60 tabs R0   Left clinic # for callback, will hold off on sending script until patient provides input.    Gertie Gowda, DO 11/20/2021

## 2021-11-21 ENCOUNTER — Telehealth: Payer: Self-pay | Admitting: *Deleted

## 2021-11-21 NOTE — Telephone Encounter (Signed)
Urine drug screen for this encounter is consistent for prescribed medication 

## 2021-11-24 ENCOUNTER — Ambulatory Visit: Payer: BC Managed Care – PPO

## 2021-11-24 ENCOUNTER — Telehealth: Payer: Self-pay | Admitting: Emergency Medicine

## 2021-11-24 MED ORDER — HYDROCODONE-ACETAMINOPHEN 5-325 MG PO TABS: 1.0000 | ORAL_TABLET | Freq: Two times a day (BID) | ORAL | 0 refills | Status: DC | PRN

## 2021-11-24 NOTE — Addendum Note (Signed)
Addended by: Durel Salts on: 11/24/2021 08:22 AM   Modules accepted: Orders

## 2021-11-24 NOTE — Telephone Encounter (Signed)
Never heard back from patient, medication due today. Will proceed with refill of prior medication/dose and re-address next visit.    - Sent script for Hydrocodone-acetaminophen 5 mg BID Prn for severe pain #60 tabs R0 to [facility], [place] [phone])   Gertie Gowda, DO 11/24/2021

## 2021-11-24 NOTE — Telephone Encounter (Signed)
Called patient and he states that he feels like he has PNA. Pt states that he finished his ABT. Pt feel like he is coughing a lot more with yellow mucus. No fever noted.   Pt states no OTC medications.   Please advise sir

## 2021-11-25 ENCOUNTER — Telehealth: Payer: Self-pay

## 2021-11-25 NOTE — Telephone Encounter (Signed)
Patient's wife called and stated the patient would like to start the Tramadol with the Hydrocodone

## 2021-11-25 NOTE — Telephone Encounter (Signed)
He needs to be seen, either as an OV at our office or go to ED / urgent care

## 2021-11-26 ENCOUNTER — Telehealth: Payer: Self-pay

## 2021-11-26 NOTE — Telephone Encounter (Signed)
Approved. Effective from 11/26/2021 through 12/25/2021.

## 2021-11-26 NOTE — Telephone Encounter (Signed)
Called patient and asked him to call office back to schedule a visit with Dr Lamonte Sakai or a NP.

## 2021-11-26 NOTE — Telephone Encounter (Signed)
PA for Hydrocodone sent to insurance through CoverMyMeds ?

## 2021-11-27 ENCOUNTER — Ambulatory Visit: Payer: BC Managed Care – PPO | Admitting: Orthopaedic Surgery

## 2021-11-27 ENCOUNTER — Encounter (HOSPITAL_COMMUNITY): Payer: Self-pay

## 2021-11-27 ENCOUNTER — Telehealth: Payer: Self-pay

## 2021-11-27 ENCOUNTER — Emergency Department (HOSPITAL_COMMUNITY): Payer: BC Managed Care – PPO

## 2021-11-27 ENCOUNTER — Observation Stay (HOSPITAL_COMMUNITY)
Admission: EM | Admit: 2021-11-27 | Discharge: 2021-11-28 | Discharge status: Against Medical Advice | Payer: BC Managed Care – PPO | Attending: Internal Medicine | Admitting: Internal Medicine

## 2021-11-27 ENCOUNTER — Observation Stay (HOSPITAL_COMMUNITY): Payer: BC Managed Care – PPO

## 2021-11-27 ENCOUNTER — Other Ambulatory Visit: Payer: Self-pay

## 2021-11-27 DIAGNOSIS — Z20822 Contact with and (suspected) exposure to covid-19: Secondary | ICD-10-CM | POA: Insufficient documentation

## 2021-11-27 DIAGNOSIS — Z79899 Other long term (current) drug therapy: Secondary | ICD-10-CM | POA: Insufficient documentation

## 2021-11-27 DIAGNOSIS — E11649 Type 2 diabetes mellitus with hypoglycemia without coma: Secondary | ICD-10-CM | POA: Diagnosis not present

## 2021-11-27 DIAGNOSIS — Z7901 Long term (current) use of anticoagulants: Secondary | ICD-10-CM | POA: Insufficient documentation

## 2021-11-27 DIAGNOSIS — E162 Hypoglycemia, unspecified: Secondary | ICD-10-CM | POA: Diagnosis present

## 2021-11-27 DIAGNOSIS — E1151 Type 2 diabetes mellitus with diabetic peripheral angiopathy without gangrene: Secondary | ICD-10-CM

## 2021-11-27 DIAGNOSIS — I110 Hypertensive heart disease with heart failure: Secondary | ICD-10-CM | POA: Diagnosis not present

## 2021-11-27 DIAGNOSIS — F17210 Nicotine dependence, cigarettes, uncomplicated: Secondary | ICD-10-CM | POA: Diagnosis not present

## 2021-11-27 DIAGNOSIS — J439 Emphysema, unspecified: Secondary | ICD-10-CM | POA: Diagnosis not present

## 2021-11-27 DIAGNOSIS — G9340 Encephalopathy, unspecified: Secondary | ICD-10-CM | POA: Diagnosis not present

## 2021-11-27 DIAGNOSIS — I739 Peripheral vascular disease, unspecified: Secondary | ICD-10-CM

## 2021-11-27 DIAGNOSIS — Z7984 Long term (current) use of oral hypoglycemic drugs: Secondary | ICD-10-CM | POA: Insufficient documentation

## 2021-11-27 DIAGNOSIS — G9341 Metabolic encephalopathy: Principal | ICD-10-CM

## 2021-11-27 DIAGNOSIS — J984 Other disorders of lung: Secondary | ICD-10-CM | POA: Diagnosis not present

## 2021-11-27 DIAGNOSIS — I5032 Chronic diastolic (congestive) heart failure: Secondary | ICD-10-CM

## 2021-11-27 DIAGNOSIS — E10649 Type 1 diabetes mellitus with hypoglycemia without coma: Secondary | ICD-10-CM

## 2021-11-27 DIAGNOSIS — I213 ST elevation (STEMI) myocardial infarction of unspecified site: Secondary | ICD-10-CM | POA: Insufficient documentation

## 2021-11-27 DIAGNOSIS — Z794 Long term (current) use of insulin: Secondary | ICD-10-CM | POA: Insufficient documentation

## 2021-11-27 DIAGNOSIS — F17290 Nicotine dependence, other tobacco product, uncomplicated: Secondary | ICD-10-CM | POA: Diagnosis not present

## 2021-11-27 DIAGNOSIS — J9601 Acute respiratory failure with hypoxia: Secondary | ICD-10-CM | POA: Diagnosis not present

## 2021-11-27 DIAGNOSIS — R6510 Systemic inflammatory response syndrome (SIRS) of non-infectious origin without acute organ dysfunction: Secondary | ICD-10-CM | POA: Insufficient documentation

## 2021-11-27 DIAGNOSIS — I959 Hypotension, unspecified: Secondary | ICD-10-CM | POA: Diagnosis not present

## 2021-11-27 DIAGNOSIS — I5042 Chronic combined systolic (congestive) and diastolic (congestive) heart failure: Secondary | ICD-10-CM | POA: Insufficient documentation

## 2021-11-27 DIAGNOSIS — I10 Essential (primary) hypertension: Secondary | ICD-10-CM

## 2021-11-27 DIAGNOSIS — I480 Paroxysmal atrial fibrillation: Secondary | ICD-10-CM | POA: Diagnosis not present

## 2021-11-27 DIAGNOSIS — Z8551 Personal history of malignant neoplasm of bladder: Secondary | ICD-10-CM | POA: Diagnosis not present

## 2021-11-27 DIAGNOSIS — R000 Tachycardia, unspecified: Secondary | ICD-10-CM | POA: Diagnosis not present

## 2021-11-27 DIAGNOSIS — I517 Cardiomegaly: Secondary | ICD-10-CM | POA: Diagnosis not present

## 2021-11-27 DIAGNOSIS — R4182 Altered mental status, unspecified: Secondary | ICD-10-CM | POA: Diagnosis present

## 2021-11-27 DIAGNOSIS — E161 Other hypoglycemia: Secondary | ICD-10-CM | POA: Diagnosis not present

## 2021-11-27 DIAGNOSIS — E119 Type 2 diabetes mellitus without complications: Secondary | ICD-10-CM

## 2021-11-27 DIAGNOSIS — J189 Pneumonia, unspecified organism: Secondary | ICD-10-CM | POA: Diagnosis not present

## 2021-11-27 DIAGNOSIS — J449 Chronic obstructive pulmonary disease, unspecified: Secondary | ICD-10-CM | POA: Diagnosis present

## 2021-11-27 DIAGNOSIS — R569 Unspecified convulsions: Secondary | ICD-10-CM | POA: Diagnosis not present

## 2021-11-27 DIAGNOSIS — R55 Syncope and collapse: Secondary | ICD-10-CM | POA: Diagnosis not present

## 2021-11-27 DIAGNOSIS — J4489 Other specified chronic obstructive pulmonary disease: Secondary | ICD-10-CM | POA: Diagnosis present

## 2021-11-27 DIAGNOSIS — F1010 Alcohol abuse, uncomplicated: Secondary | ICD-10-CM

## 2021-11-27 DIAGNOSIS — J9811 Atelectasis: Secondary | ICD-10-CM | POA: Diagnosis not present

## 2021-11-27 DIAGNOSIS — R918 Other nonspecific abnormal finding of lung field: Secondary | ICD-10-CM | POA: Diagnosis not present

## 2021-11-27 LAB — COMPREHENSIVE METABOLIC PANEL
ALT: 12 U/L (ref 0–44)
AST: 27 U/L (ref 15–41)
Albumin: 3.2 g/dL — ABNORMAL LOW (ref 3.5–5.0)
Alkaline Phosphatase: 53 U/L (ref 38–126)
Anion gap: 14 (ref 5–15)
BUN: 13 mg/dL (ref 8–23)
CO2: 23 mmol/L (ref 22–32)
Calcium: 9.6 mg/dL (ref 8.9–10.3)
Chloride: 103 mmol/L (ref 98–111)
Creatinine, Ser: 1.51 mg/dL — ABNORMAL HIGH (ref 0.61–1.24)
GFR, Estimated: 51 mL/min — ABNORMAL LOW (ref >60)
Glucose, Bld: 43 mg/dL — CL (ref 70–99)
Potassium: 3.3 mmol/L — ABNORMAL LOW (ref 3.5–5.1)
Sodium: 140 mmol/L (ref 135–145)
Total Bilirubin: 0.6 mg/dL (ref 0.3–1.2)
Total Protein: 6.6 g/dL (ref 6.5–8.1)

## 2021-11-27 LAB — I-STAT CHEM 8, ED
BUN: 14 mg/dL (ref 8–23)
Calcium, Ion: 1.22 mmol/L (ref 1.15–1.40)
Chloride: 101 mmol/L (ref 98–111)
Creatinine, Ser: 1.5 mg/dL — ABNORMAL HIGH (ref 0.61–1.24)
Glucose, Bld: 35 mg/dL — CL (ref 70–99)
HCT: 44 % (ref 39.0–52.0)
Hemoglobin: 15 g/dL (ref 13.0–17.0)
Potassium: 3.3 mmol/L — ABNORMAL LOW (ref 3.5–5.1)
Sodium: 141 mmol/L (ref 135–145)
TCO2: 24 mmol/L (ref 22–32)

## 2021-11-27 LAB — URINALYSIS, ROUTINE W REFLEX MICROSCOPIC
Bacteria, UA: NONE SEEN
Bilirubin Urine: NEGATIVE
Glucose, UA: >=500 mg/dL — AB
Hgb urine dipstick: NEGATIVE
Ketones, ur: NEGATIVE mg/dL
Leukocytes,Ua: NEGATIVE
Nitrite: NEGATIVE
Protein, ur: NEGATIVE mg/dL
Specific Gravity, Urine: 1.007 (ref 1.005–1.030)
pH: 8 (ref 5.0–8.0)

## 2021-11-27 LAB — CBC WITH DIFFERENTIAL/PLATELET
Abs Immature Granulocytes: 0.09 10*3/uL — ABNORMAL HIGH (ref 0.00–0.07)
Basophils Absolute: 0.1 10*3/uL (ref 0.0–0.1)
Basophils Relative: 1 %
Eosinophils Absolute: 0.3 10*3/uL (ref 0.0–0.5)
Eosinophils Relative: 3 %
HCT: 43.7 % (ref 39.0–52.0)
Hemoglobin: 14.4 g/dL (ref 13.0–17.0)
Immature Granulocytes: 1 %
Lymphocytes Relative: 15 %
Lymphs Abs: 1.7 10*3/uL (ref 0.7–4.0)
MCH: 33.8 pg (ref 26.0–34.0)
MCHC: 33 g/dL (ref 30.0–36.0)
MCV: 102.6 fL — ABNORMAL HIGH (ref 80.0–100.0)
Monocytes Absolute: 1.1 10*3/uL — ABNORMAL HIGH (ref 0.1–1.0)
Monocytes Relative: 10 %
Neutro Abs: 7.6 10*3/uL (ref 1.7–7.7)
Neutrophils Relative %: 70 %
Platelets: 212 10*3/uL (ref 150–400)
RBC: 4.26 MIL/uL (ref 4.22–5.81)
RDW: 14.8 % (ref 11.5–15.5)
WBC: 10.8 10*3/uL — ABNORMAL HIGH (ref 4.0–10.5)
nRBC: 0 % (ref 0.0–0.2)

## 2021-11-27 LAB — CBG MONITORING, ED
Glucose-Capillary: 143 mg/dL — ABNORMAL HIGH (ref 70–99)
Glucose-Capillary: 25 mg/dL — CL (ref 70–99)
Glucose-Capillary: 59 mg/dL — ABNORMAL LOW (ref 70–99)
Glucose-Capillary: 99 mg/dL (ref 70–99)
Glucose-Capillary: 87 mg/dL (ref 70–99)
Glucose-Capillary: 63 mg/dL — ABNORMAL LOW (ref 70–99)
Glucose-Capillary: 170 mg/dL — ABNORMAL HIGH (ref 70–99)

## 2021-11-27 LAB — TROPONIN I (HIGH SENSITIVITY)
Troponin I (High Sensitivity): 16 ng/L (ref <18)
Troponin I (High Sensitivity): 80 ng/L — ABNORMAL HIGH (ref <18)

## 2021-11-27 LAB — MAGNESIUM: Magnesium: 1.9 mg/dL (ref 1.7–2.4)

## 2021-11-27 LAB — RAPID URINE DRUG SCREEN, HOSP PERFORMED
Amphetamines: NOT DETECTED
Barbiturates: NOT DETECTED
Benzodiazepines: NOT DETECTED
Cocaine: NOT DETECTED
Opiates: POSITIVE — AB
Tetrahydrocannabinol: NOT DETECTED

## 2021-11-27 LAB — RESP PANEL BY RT-PCR (FLU A&B, COVID) ARPGX2
Influenza A by PCR: NEGATIVE
Influenza B by PCR: NEGATIVE
SARS Coronavirus 2 by RT PCR: NEGATIVE

## 2021-11-27 LAB — PROTIME-INR
INR: 1.9 — ABNORMAL HIGH (ref 0.8–1.2)
Prothrombin Time: 21.9 seconds — ABNORMAL HIGH (ref 11.4–15.2)

## 2021-11-27 LAB — ETHANOL: Alcohol, Ethyl (B): <10 mg/dL (ref <10)

## 2021-11-27 LAB — GLUCOSE, CAPILLARY: Glucose-Capillary: 240 mg/dL — ABNORMAL HIGH (ref 70–99)

## 2021-11-27 MED ORDER — IPRATROPIUM-ALBUTEROL 0.5-2.5 (3) MG/3ML IN SOLN: 3.0000 mL | RESPIRATORY_TRACT | Status: DC | PRN

## 2021-11-27 MED ORDER — DEXTROSE-NACL 10-0.45 % IV SOLN
INTRAVENOUS | Status: DC
  Filled 2021-11-27 (×2): qty 1000

## 2021-11-27 MED ORDER — DEXTROSE 50 % IV SOLN
INTRAVENOUS | Status: AC
  Filled 2021-11-27: qty 50

## 2021-11-27 MED ORDER — ADULT MULTIVITAMIN W/MINERALS CH
1.0000 | ORAL_TABLET | Freq: Every day | ORAL | Status: DC
  Administered 2021-11-28: 1 via ORAL
  Filled 2021-11-27: qty 1

## 2021-11-27 MED ORDER — METOPROLOL TARTRATE 50 MG PO TABS
50.0000 mg | ORAL_TABLET | Freq: Two times a day (BID) | ORAL | Status: DC
  Administered 2021-11-27 – 2021-11-28 (×2): 50 mg via ORAL
  Filled 2021-11-27 (×2): qty 1

## 2021-11-27 MED ORDER — DEXTROSE 50 % IV SOLN
25.0000 mL | Freq: Once | INTRAVENOUS | Status: AC
  Administered 2021-11-27: 25 mL via INTRAVENOUS
  Filled 2021-11-27: qty 50

## 2021-11-27 MED ORDER — BUDESONIDE 0.25 MG/2ML IN SUSP
0.2500 mg | Freq: Two times a day (BID) | RESPIRATORY_TRACT | Status: DC
  Administered 2021-11-27 – 2021-11-28 (×3): 0.25 mg via RESPIRATORY_TRACT
  Filled 2021-11-27 (×4): qty 2

## 2021-11-27 MED ORDER — THIAMINE MONONITRATE 100 MG PO TABS
100.0000 mg | ORAL_TABLET | Freq: Every day | ORAL | Status: DC
  Administered 2021-11-28: 100 mg via ORAL
  Filled 2021-11-27: qty 1

## 2021-11-27 MED ORDER — ONDANSETRON HCL 4 MG/2ML IJ SOLN: 4.0000 mg | Freq: Four times a day (QID) | INTRAMUSCULAR | Status: DC | PRN

## 2021-11-27 MED ORDER — LABETALOL HCL 5 MG/ML IV SOLN: 10.0000 mg | INTRAVENOUS | Status: DC | PRN

## 2021-11-27 MED ORDER — RIVAROXABAN 20 MG PO TABS
20.0000 mg | ORAL_TABLET | Freq: Every day | ORAL | Status: DC
  Administered 2021-11-27: 20 mg via ORAL
  Filled 2021-11-27: qty 2

## 2021-11-27 MED ORDER — INSULIN ASPART 100 UNIT/ML IJ SOLN
0.0000 [IU] | Freq: Three times a day (TID) | INTRAMUSCULAR | Status: DC
  Administered 2021-11-28: 8 [IU] via SUBCUTANEOUS

## 2021-11-27 MED ORDER — ALBUTEROL SULFATE (2.5 MG/3ML) 0.083% IN NEBU: 3.0000 mL | INHALATION_SOLUTION | Freq: Four times a day (QID) | RESPIRATORY_TRACT | Status: DC | PRN

## 2021-11-27 MED ORDER — DEXTROSE 50 % IV SOLN: 50.0000 mL | Freq: Once | INTRAVENOUS | Status: AC

## 2021-11-27 MED ORDER — SODIUM CHLORIDE 0.9% FLUSH
3.0000 mL | Freq: Two times a day (BID) | INTRAVENOUS | Status: DC
  Administered 2021-11-27 – 2021-11-28 (×3): 3 mL via INTRAVENOUS

## 2021-11-27 MED ORDER — FOLIC ACID 1 MG PO TABS
1.0000 mg | ORAL_TABLET | Freq: Every day | ORAL | Status: DC
  Administered 2021-11-28: 1 mg via ORAL
  Filled 2021-11-27: qty 1

## 2021-11-27 MED ORDER — LORATADINE 10 MG PO TABS
10.0000 mg | ORAL_TABLET | Freq: Every day | ORAL | Status: DC
  Administered 2021-11-28: 10 mg via ORAL
  Filled 2021-11-27: qty 1

## 2021-11-27 MED ORDER — DEXTROSE 50 % IV SOLN
1.0000 | Freq: Once | INTRAVENOUS | Status: AC
  Administered 2021-11-27: 50 mL via INTRAVENOUS

## 2021-11-27 MED ORDER — ONDANSETRON HCL 4 MG PO TABS: 4.0000 mg | ORAL_TABLET | Freq: Four times a day (QID) | ORAL | Status: DC | PRN

## 2021-11-27 MED ORDER — SODIUM CHLORIDE 0.9% FLUSH: 3.0000 mL | INTRAVENOUS | Status: DC | PRN

## 2021-11-27 MED ORDER — METHYLPREDNISOLONE SODIUM SUCC 40 MG IJ SOLR
40.0000 mg | Freq: Two times a day (BID) | INTRAMUSCULAR | Status: DC
  Administered 2021-11-27 – 2021-11-28 (×2): 40 mg via INTRAVENOUS
  Filled 2021-11-27 (×3): qty 1

## 2021-11-27 MED ORDER — DEXTROSE-NACL 5-0.9 % IV SOLN: INTRAVENOUS | Status: AC

## 2021-11-27 MED ORDER — LOSARTAN POTASSIUM 50 MG PO TABS
100.0000 mg | ORAL_TABLET | Freq: Every day | ORAL | Status: DC
  Administered 2021-11-27 – 2021-11-28 (×2): 100 mg via ORAL
  Filled 2021-11-27 (×2): qty 2

## 2021-11-27 MED ORDER — FAMOTIDINE 20 MG PO TABS
20.0000 mg | ORAL_TABLET | Freq: Every morning | ORAL | Status: DC
  Administered 2021-11-28: 20 mg via ORAL
  Filled 2021-11-27: qty 1

## 2021-11-27 MED ORDER — THIAMINE HCL 100 MG/ML IJ SOLN
100.0000 mg | Freq: Every day | INTRAMUSCULAR | Status: DC
  Administered 2021-11-27: 100 mg via INTRAVENOUS
  Filled 2021-11-27: qty 2

## 2021-11-27 MED ORDER — INSULIN ASPART 100 UNIT/ML IJ SOLN
2.0000 [IU] | Freq: Once | INTRAMUSCULAR | Status: AC
  Administered 2021-11-27: 2 [IU] via SUBCUTANEOUS

## 2021-11-27 MED ORDER — LORAZEPAM 2 MG/ML IJ SOLN: 1.0000 mg | INTRAMUSCULAR | Status: DC | PRN

## 2021-11-27 MED ORDER — DEXTROSE 50 % IV SOLN
INTRAVENOUS | Status: AC
  Administered 2021-11-27: 50 mL via INTRAVENOUS
  Filled 2021-11-27: qty 50

## 2021-11-27 MED ORDER — LORAZEPAM 1 MG PO TABS: 1.0000 mg | ORAL_TABLET | ORAL | Status: DC | PRN

## 2021-11-27 MED ORDER — SODIUM CHLORIDE 0.9 % IV SOLN: 250.0000 mL | INTRAVENOUS | Status: DC | PRN

## 2021-11-27 MED ORDER — HYDROCODONE-ACETAMINOPHEN 5-325 MG PO TABS
1.0000 | ORAL_TABLET | Freq: Four times a day (QID) | ORAL | Status: DC | PRN
  Administered 2021-11-27 – 2021-11-28 (×3): 1 via ORAL
  Filled 2021-11-27 (×3): qty 1

## 2021-11-27 NOTE — H&P (Signed)
History and Physical    Thomas Sandoval. NUU:[id number] DOB: [birth year] DOA: 11/27/2021  PCP: Chevis Pretty, FNP (Confirm with patient/family/NH records and if not entered, this has to be entered at [facility] point of entry) Patient coming from: Home  I have personally briefly reviewed patient's old medical records in [place]  Chief Complaint: Feeling tired  HPI: Thomas Bouch. is a 66 y.o. male with medical history significant of IDDM, PAF on Xarelto, interstitial lung disease, COPD, HTN, cigar smoker, alcohol abuse, presented with seizure-like activity and altered mentations.  Patient has insulin-dependent diabetes, on Lantus and sliding scale.  Lately, wife noticed patient has had fluctuating glucose readings especially in the mornings, can be as high as 180 and as low as 70-80s.  Family has recently decreased Lantus to avoid low glucose from Lantus 42 units to 34 units every a.m.  This morning, wife checked patient fingerstick was 180 and gave 10 units of Humalog and 34 units of Lantus and left home.  Son observed patient became unresponsive and had seizure-like activity and probably tongue biting but no loss control of urine or bowel movement.  Family called EMS, EMS arrived and found patient obtunded, tachycardia hypotensive blood pressure 60/40, glucose = 40, multiple rounds of D50 given, and monitoring showed patient has multiple leads ST elevation and code STEMI called on route to the hospital.  Patient drinks every morning and claimed that he drank 1 shot of vodka this morning.  He also has chronic back pain on hydrocodone, he took 1 this morning.  Denies any chest pain, does complain about mild shortness of breath.  ED Course: Patient remained lethargic, blood pressure borderline low, tachycardia in and out of A-fib.  EKG showed no significant ST changes, cardiology evaluated patient and consult the code STEMI.  Glucose maintained low after 2 rounds of D50 in the ED,  and D10 drip started.  Patient was also found to be borderline hypoxic stabilized on 4 L oxygen.  Chest x-ray no acute findings other than chronic interstitial changes.  CT head negative for acute findings.  Review of Systems: As per HPI otherwise 14 point review of systems negative.    Past Medical History:  Diagnosis Date   Arthritis    Bladder cancer ([place])    Chronic back pain    Chronic combined systolic and diastolic CHF (congestive heart failure) ([place]) 07/16/2021   COPD (chronic obstructive pulmonary disease) (HCC)    DDD (degenerative disc disease)    Diabetic retinopathy of both eyes (HCC)    Essential hypertension    GERD (gastroesophageal reflux disease)    History of atrial flutter 02/2011   Converted to NSR with Cardizem   History of chronic bronchitis    History of hemolytic anemia 02/2011   secondary to Avelox   HOH (hard of hearing)    HOH (hard of hearing)    no eardrum and nerve damage on R, also HOH on L   Mitral valve prolapse    a. 2D Echo 11/27/14: EF 55-60%; images were inadequate for LV wall motion assessment, + mild late systolic mitral valve prolapse involving the anterior leaflet.   PAD (peripheral artery disease) ([place]) 04/2014   Dr Trula Slade; bilateral SFA occlusion, R mid, L distal   PAF (paroxysmal atrial fibrillation) ([place])    a. Dx 11/2014 during admission for perf ulcer.   Perforated ulcer ([place])    a. 11/2014 s/p surgery.   Productive cough    Smokers' cough (  [place])    Type 1 diabetes mellitus ([place]) 1977    Past Surgical History:  Procedure Laterality Date   CARDIOVERSION N/A 01/20/2021   Procedure: CARDIOVERSION;  Surgeon: Fay Records, MD;  Location: [facility];  Service: Cardiovascular;  Laterality: N/A;   CYSTOSCOPY WITH URETEROSCOPY Right 08/14/2013   Procedure: CYSTOSCOPY WITH URETEROSCOPY BLADDER BIOPSY ;  Surgeon: Claybon Jabs, MD;  Location: [facility];  Service: Urology;  Laterality: Right;   ESOPHAGOGASTRODUODENOSCOPY  N/A 02/06/2013   Procedure: ESOPHAGOGASTRODUODENOSCOPY (EGD);  Surgeon: Lafayette Dragon, MD;  Location: [facility] END[facility]COPY;  Service: Endoscopy;  Laterality: N/A;   LAPAROSCOPY N/A 11/25/2014   Procedure: LAPAROSCOPIC PRIMARY REPAIR OF PERFORATED PREPYLORIC ULCER WITH Silvestre Gunner;  Surgeon: Greer Pickerel, MD;  Location: [facility];  Service: General;  Laterality: N/A;   TONSILLECTOMY  as child   TRANSTHORACIC ECHOCARDIOGRAM  02-17-2011   MODERATE LVH/  EF 65%   TRANSURETHRAL RESECTION OF BLADDER TUMOR WITH GYRUS (TURBT-GYRUS) N/A 06/12/2013   Procedure: TRANSURETHRAL RESECTION OF BLADDER TUMOR WITH GYRUS (TURBT-GYRUS);  Surgeon: Claybon Jabs, MD;  Location: [facility];  Service: Urology;  Laterality: N/A;   TYMPANIC MEMBRANE REPAIR  as child     reports that he has been smoking cigarettes. He has a 60.00 pack-year smoking history. He quit smokeless tobacco use about 43 years ago. He reports that he does not currently use alcohol. He reports that he does not use drugs.  Allergies  Allergen Reactions   Azithromycin Other (See Comments) and Nausea And Vomiting    "Severe stomach cramps; told to list as an allergy by dr. Huel Cote ago"   Avelox [Moxifloxacin Hcl In Nacl] Other (See Comments)    Hemolysis  In 2012   Bactrim [Sulfamethoxazole-Trimethoprim] Diarrhea and Nausea And Vomiting   Moxifloxacin Other (See Comments)   Sulfamethoxazole-Trimethoprim Other (See Comments)    Family History  Problem Relation Age of Onset   Breast cancer Mother    Cancer Mother        Breast   Rheumatic fever Father    Heart disease Father    Heart attack Father        Massive    Diabetes Son      Prior to Admission medications   Medication Sig Start Date End Date Taking? Authorizing Provider  albuterol (VENTOLIN HFA) 108 (90 Base) MCG/ACT inhaler Inhale 2 puffs into the lungs every 6 (six) hours as needed for wheezing or shortness of breath. 08/18/21   Dwyane Dee, MD  cetirizine (ZYRTEC) 10 MG  tablet Take 10 mg by mouth daily.    [provider]  doxycycline (VIBRA-TABS) 100 MG tablet Take 1 tablet (100 mg total) by mouth 2 (two) times daily. 11/13/21   Collene Gobble, MD  empagliflozin (JARDIANCE) 10 MG TABS tablet Take 1 tablet (10 mg total) by mouth daily before breakfast. 10/30/21   Hassell Done, [name], FNP  famotidine (PEPCID) 20 MG tablet Take 1 tablet (20 mg total) by mouth every morning. Reported on 04/16/2015 10/30/21   Chevis Pretty, FNP  furosemide (LASIX) 40 MG tablet Take 0.5 tablets (20 mg total) by mouth daily. 11/19/21   Donato Heinz, MD  HYDROcodone-acetaminophen (NORCO/VICODIN) 5-325 MG tablet Take 1 tablet by mouth 2 (two) times daily as needed for severe pain. 11/24/21   Durel Salts C, DO  insulin glargine (LANTUS) 100 UNIT/ML injection Inject 0.2 mLs (20 Units total) into the skin daily. 10/30/21   Chevis Pretty, FNP  Insulin Syringe-Needle  U-100 (B-D INS SYR ULTRAFINE 1CC/30G) 30G X 1/2" 1 ML MISC Use 4 times a day with insulin Dx E11.9 08/22/20   Hassell Done, [name], FNP  Insulin Syringes, Disposable, U-100 1 ML MISC Use 4 times a day for insulin injection Dx E11.9 12/27/18   Hassell Done, [name], FNP  ipratropium-albuterol (DUONEB) 0.5-2.5 (3) MG/3ML SOLN Take 3 mLs by nebulization every 4 (four) hours as needed. 09/25/21   Gwenlyn Perking, FNP  levalbuterol Penne Lash) 0.31 MG/3ML nebulizer solution Take 3 mLs (0.31 mg total) by nebulization every 4 (four) hours as needed for wheezing. 09/25/21   Gwenlyn Perking, FNP  losartan (COZAAR) 50 MG tablet Take 1 tablet (50 mg total) by mouth at bedtime. hold this medication until follow up 10/30/21   Chevis Pretty, FNP  metoprolol succinate (TOPROL-XL) 100 MG 24 hr tablet Take 1 tablet (100 mg total) by mouth daily. Take with or immediately following a meal. 10/30/21 10/25/22  Hassell Done, [name], FNP  naloxone [facility]) nasal spray 4 mg/0.1 mL Place 1 spray into the nose as needed  (For opiate overdose). 11/17/21   Durel Salts C, DO  NOVOLOG 100 UNIT/ML injection PER SLIDING SCALE: 190 - 200 = 2 UNITS. 300 AND ABOVE = 7 UNITS. 10/20/21   Hassell Done, [name], FNP  OVER THE COUNTER MEDICATION Take 1 Scoop by mouth in the morning and at bedtime. Super Beets    [provider]  rivaroxaban (XARELTO) 20 MG TABS tablet Take 1 tablet (20 mg total) by mouth daily with supper. 10/30/21   Chevis Pretty, FNP    Physical Exam: Vitals:   11/27/21 1145 11/27/21 1200 11/27/21 1215 11/27/21 1230  BP: 121/80 115/87 138/89   Pulse: 94 100 98   Resp: (!) 21 (!) 21 20 (!) 27  Temp:      TempSrc:      SpO2: 98% 97% 97%     Constitutional: NAD, calm, comfortable Vitals:   11/27/21 1145 11/27/21 1200 11/27/21 1215 11/27/21 1230  BP: 121/80 115/87 138/89   Pulse: 94 100 98   Resp: (!) 21 (!) 21 20 (!) 27  Temp:      TempSrc:      SpO2: 98% 97% 97%    Eyes: PERRL, lids and conjunctivae normal ENMT: Mucous membranes are dry. Posterior pharynx clear of any exudate or lesions.Normal dentition.  Tongue bite mark on right edge with dried blood in mouth Neck: normal, supple, no masses, no thyromegaly Respiratory: clear to auscultation bilaterally, scattered wheezing and diffuse crackles right> left, increasing breathing effort. No accessory muscle use.  Cardiovascular: Regular rate and rhythm, no murmurs / rubs / gallops. No extremity edema. 2+ pedal pulses. No carotid bruits.  Abdomen: no tenderness, no masses palpated. No hepatosplenomegaly. Bowel sounds positive.  Musculoskeletal: no clubbing / cyanosis. No joint deformity upper and lower extremities. Good ROM, no contractures. Normal muscle tone.  Skin: no rashes, lesions, ulcers. No induration Neurologic: No facial droops, moving all limbs, no focal deficit, following simple commands Psychiatric: Arousable, confused    Labs on Admission: I have personally reviewed following labs and imaging  studies  CBC: Recent Labs  Lab 11/27/21 1045 11/27/21 1049  WBC 10.8*  --   NEUTROABS 7.6  --   HGB 14.4 15.0  HCT 43.7 44.0  MCV 102.6*  --   PLT 212  --    Basic Metabolic Panel: Recent Labs  Lab 11/27/21 1045 11/27/21 1049  NA 140 141  K 3.3* 3.3*  CL 103 101  CO2  23  --   GLUCOSE 43* 35*  BUN 13 14  CREATININE 1.51* 1.50*  CALCIUM 9.6  --   MG 1.9  --    GFR: Estimated Creatinine Clearance: 49.8 mL/min (A) (by C-G formula based on SCr of 1.5 mg/dL (H)). Liver Function Tests: Recent Labs  Lab 11/27/21 1045  AST 27  ALT 12  ALKPHOS 53  BILITOT 0.6  PROT 6.6  ALBUMIN 3.2*   No results for input(s): "LIPASE", "AMYLASE" in the last 168 hours. No results for input(s): "AMMONIA" in the last 168 hours. Coagulation Profile: Recent Labs  Lab 11/27/21 1045  INR 1.9*   Cardiac Enzymes: No results for input(s): "CKTOTAL", "CKMB", "CKMBINDEX", "TROPONINI" in the last 168 hours. BNP (last 3 results) No results for input(s): "PROBNP" in the last 8760 hours. HbA1C: No results for input(s): "HGBA1C" in the last 72 hours. CBG: Recent Labs  Lab 11/27/21 1046 11/27/21 1103 11/27/21 1158  GLUCAP 25* 143* 59*   Lipid Profile: No results for input(s): "CHOL", "HDL", "LDLCALC", "TRIG", "CHOLHDL", "LDLDIRECT" in the last 72 hours. Thyroid Function Tests: No results for input(s): "TSH", "T4TOTAL", "FREET4", "T3FREE", "THYROIDAB" in the last 72 hours. Anemia Panel: No results for input(s): "VITAMINB12", "FOLATE", "FERRITIN", "TIBC", "IRON", "RETICCTPCT" in the last 72 hours. Urine analysis:    Component Value Date/Time   COLORURINE AMBER (A) 12/02/2020 1445   APPEARANCEUR HAZY (A) 12/02/2020 1445   LABSPEC 1.025 12/02/2020 1445   PHURINE 5.0 12/02/2020 1445   GLUCOSEU >=500 (A) 12/02/2020 1445   HGBUR SMALL (A) 12/02/2020 1445   BILIRUBINUR NEGATIVE 12/02/2020 1445   BILIRUBINUR neg 04/21/2013 1634   KETONESUR 80 (A) 12/02/2020 1445   PROTEINUR 100 (A)  12/02/2020 1445   UROBILINOGEN 1.0 11/25/2014 0348   NITRITE NEGATIVE 12/02/2020 1445   LEUKOCYTESUR NEGATIVE 12/02/2020 1445    Radiological Exams on Admission: CT Head Wo Contrast  Result Date: 11/27/2021 CLINICAL DATA:  Seizure EXAM: CT HEAD WITHOUT CONTRAST TECHNIQUE: Contiguous axial images were obtained from the base of the skull through the vertex without intravenous contrast. RADIATION DOSE REDUCTION: This exam was performed according to the departmental dose-optimization program which includes automated exposure control, adjustment of the mA and/or kV according to patient size and/or use of iterative reconstruction technique. COMPARISON:  None Available. FINDINGS: Brain: No evidence of acute infarction, hemorrhage, hydrocephalus, extra-axial collection or mass lesion/mass effect. Scattered low-density changes within the periventricular and subcortical white matter compatible with chronic microvascular ischemic change. Mild diffuse cerebral volume loss. Vascular: Atherosclerotic calcifications involving the large vessels of the skull base. No unexpected hyperdense vessel. Skull: Normal. Negative for fracture or focal lesion. Sinuses/Orbits: No acute finding. Other: None. IMPRESSION: 1. No acute intracranial abnormality. 2. Mild chronic microvascular ischemic change and cerebral volume loss. Electronically Signed   By: Davina Poke D.O.   On: 11/27/2021 11:28   DG Chest Portable 1 View  Result Date: 11/27/2021 CLINICAL DATA:  Possible myocardial infarction. EXAM: PORTABLE CHEST 1 VIEW COMPARISON:  Radiographs 09/25/2021 and 08/16/2021.  CT 08/17/2021. FINDINGS: 1105 hours. Stable mild cardiomegaly. Diffuse reticulonodular interstitial prominence throughout both lungs and biapical scarring are unchanged. No superimposed edema, confluent airspace opacity, pleural effusion or pneumothorax. The bones appear unremarkable.  Telemetry leads overlie the chest. IMPRESSION: Stable radiographic  appearance of the chest with chronic lung disease. No acute findings identified. Electronically Signed   By: Richardean Sale M.D.   On: 11/27/2021 11:17    EKG: Independently reviewed.  In the note of sinus rhythm and  A-fib, no acute ST changes.  Assessment/Plan Principal Problem:   Hypocalcemia Active Problems:   Uncontrolled type 1 diabetes mellitus with hypoglycemia, with long-term current use of insulin ([place])  (please populate well all problems here in Problem List. (For example, if patient is on BP meds at home and you resume or decide to hold them, it is a problem that needs to be her. Same for CAD, COPD, HLD and so on)  Acute metabolic encephalopathy -Secondary to hypoglycemia -Most recent A1c in in August was 5.2, indicating over stringent glucose control.  Discussed with wife over the phone, recommend cut down both long-acting and meal coverage for now and recommend patient use outpatient endocrinology consult, wife agreed. -Cut down the rate of D10 to 50 mL/h, add D5 normal saline at 75 mL/h to treat both hypoglycemia and hypotension.  Pseudoseizure -No history of seizure, likely the episode related to hypoglycemia.  Monitor without antiseizure medication.  SIRS -Given the acute hypoxia, suspect aspiration and probably early aspiration bronchitis/aspiration pneumonia.  Check CT chest, low threshold for antibiotics.  Acute hypoxic respiratory failure -Again suspect aspiration pneumonia during the pseudoseizure episode, check CT chest and may need antibiotics for short-term.  Hypotension -Resolved blood pressures remain borderline low, doubt sepsis. -Continue IV fluid as above -Check CT chest to rule out aspiration pneumonia. -Hold off home BP meds for now  Acute COPD exacerbation -Short course of IV steroid bridging for p.o. steroid for tomorrow -Continue DuoNebs and as needed albuterol -No formal PFT, add Pulmicort for now.  PAF -Change long-acting metoprolol to twice  daily -Continue Xarelto  Acute ST changes -Cardiology consultation appreciated, cardiology suspected initial ST-T changes in ambulance probably due to extreme tachycardia which resolved later after IV bolus and hydration.  Troponin negative x2.  No chest pains.  HTN -Hold losartan and Lasix -On IV fluid  Alcohol abuse -Last drink this morning less likely DT, clinically no symptoms signs of alcohol withdrawal this point. -CIWA protocol with as needed benzos  IDDM -As above   DVT prophylaxis: Xarelto Code Status: Full code Family Communication: Wife over the phone Disposition Plan: Expect less than 2 midnight hospital stay Consults called: Cardiology Admission status: Telemetry observation   Lequita Halt MD [facility] Pager [phone]  11/27/2021, 1:00 PM

## 2021-11-27 NOTE — ED Notes (Signed)
EDP notified of BG 59.

## 2021-11-27 NOTE — ED Triage Notes (Signed)
Pt BIB [facility] as Code Stemi d/t depression shown on his 76 Lead. EMS reports upon their arrival his CBG was 40 & he does have a Hx of DM. 1 amp of D 50 was given & his CBG increased to 237. His initial pressure was 50/30, 1L of NS was given in 20g Rt PIV & his BP went to 60/40. Pt remained unconscious & very difficult to rouse even with sternal rub while en route to ED. Family endorses witnessed seizure-like activity that he does NOT have a Hx of EMS thinks he may be postictal. D/t his use & new prescription refill of Hydrocodone he was given 0.5 mg Narcan which did NOT show any changes in his alertness. Pt is reported to be noncompliant with going to PCP but shows he did go to a cardiologist recently.

## 2021-11-27 NOTE — Telephone Encounter (Signed)
Called patient and left voicemail for patient to call office back and schedule follow up with Dr Lamonte Sakai or a NP. Advised patient that if he still needs [place] regarding his concerns to call office back. Closing encounter since this is the second attempt to reach patient.

## 2021-11-27 NOTE — ED Notes (Signed)
Dr. Roosevelt Locks informed of BG 63. Order received.

## 2021-11-27 NOTE — ED Notes (Signed)
Wife reports pt had 34 units of Lantus and 10 units of Novolog this AM.

## 2021-11-27 NOTE — Progress Notes (Signed)
CT chest reviewed. RLL infiltrate likely a resolving PNA recently, 2-3 weeks ago, the left lung nodule looked stable. No significant acute infection indicating aspiration. Decide to hold off ABX.  Glu improving, cut down D10 rate.

## 2021-11-27 NOTE — Consult Note (Signed)
Cardiology Consultation   Patient ID: Thomas Sandoval. MRN: [id number]; DOB: [birth year]  Admit date: 11/27/2021 Date of Consult: 11/27/2021  PCP:  Thomas Sandoval, [facility] Cardiologist:  Thomas Heinz, MD  Electrophysiologist:  Thomas Epley, MD       Patient Profile:   Thomas Sandoval. is a 66 y.o. male with a hx of paroxysmal atrial fibrillation and cardiomyopathy who is being seen 11/27/2021 for the evaluation of abnormal EKG at the request of Dr. Verner Sandoval.  History of Present Illness:   Thomas Sandoval has a history of paroxysmal atrial fibrillation, transient left ventricular systolic dysfunction presumably due to tachycardia cardiomyopathy, HTN, DM type II on insulin, PAD (occluded superficial femoral arteries bilaterally), history of alcohol abuse, history of perforated gastric ulcer 2016 requiring surgery, current chronic prescription opiate use, smoking.    He was found to be unresponsive by his family, who also describes possible seizure-like activity.  it is unclear whether he had witnessed syncope.  There is no report of prodromal chest pain or shortness of breath.  On EMS arrival he was unresponsive, was hypoglycemic (fingerstick blood sugar 48) and severely hypotensive (60/40 mmHg).  He received a liter of fluid, Narcan (without improvement) and intravenous dextrose.  His glucose increased to over 200 but he remained unresponsive.  His ECG shows diffuse ST segment depression in all leads except aVR where there is mild ST segment elevation.  His blood pressure has improved to 120/60.  He is arousable and will answer very brief questions (name, date of birth).  He immediately falls asleep again.  He was clearly encephalopathic.  When asked whether he has chest pain he shakes his head "no".  Also denies headache or abdominal pain.  Does not really answer to any other queries.  Repeat blood sugar a few minutes later was again  low at 28.  He has a history of depressed left ventricular systolic function by echo in September 2022 when he was admitted with acute pancreatitis and rapid atrial fibrillation.  Presumed to be tachycardia related cardiomyopathy, but work-up was never completed since he left AGAINST MEDICAL ADVICE.  He underwent electrical cardioversion in January 20, 2021 with return to sinus rhythm, but an event monitor performed in December 2022 showed 100% atrial fibrillation over a period of 3 days.  Nevertheless, repeat echocardiogram performed in May 21, 2021 shows that he was in sinus rhythm and had complete recovery of left ventricular systolic function with ejection fraction of 55 to 60%, no significant valvular abnormalities.  He has not undergone ischemic work-up, but has not reported angina pectoris.  He has a prescription for chronic anticoagulation with Xarelto but its not clear whether he is taking that at this time.  According to Thomas Sandoval note from 11/19/2021 he has started drinking alcohol again (even though his family reports that he has quit).  He continues to smoke a pack of cigarettes daily.  Notes from epic 11/24/2021 show that he called the pulmonary clinic with complaints of a cough thinking that he has pneumonia.  Past Medical History:  Diagnosis Date   Arthritis    Bladder cancer ([place])    Chronic back pain    Chronic combined systolic and diastolic CHF (congestive heart failure) ([place]) 07/16/2021   COPD (chronic obstructive pulmonary disease) (HCC)    DDD (degenerative disc disease)    Diabetic retinopathy of both eyes (HCC)    Essential hypertension    GERD (gastroesophageal  reflux disease)    History of atrial flutter 02/2011   Converted to NSR with Cardizem   History of chronic bronchitis    History of hemolytic anemia 02/2011   secondary to Avelox   HOH (hard of hearing)    HOH (hard of hearing)    no eardrum and nerve damage on R, also HOH on L   Mitral valve prolapse     a. 2D Echo 11/27/14: EF 55-60%; images were inadequate for LV wall motion assessment, + mild late systolic mitral valve prolapse involving the anterior leaflet.   PAD (peripheral artery disease) ([place]) 04/2014   Dr Trula Slade; bilateral SFA occlusion, R mid, L distal   PAF (paroxysmal atrial fibrillation) ([place])    a. Dx 11/2014 during admission for perf ulcer.   Perforated ulcer ([place])    a. 11/2014 s/p surgery.   Productive cough    Smokers' cough ([place])    Type 1 diabetes mellitus ([place]) 1977    Past Surgical History:  Procedure Laterality Date   CARDIOVERSION N/A 01/20/2021   Procedure: CARDIOVERSION;  Surgeon: Fay Records, MD;  Location: [place] [facility];  Service: Cardiovascular;  Laterality: N/A;   CYSTOSCOPY WITH URETEROSCOPY Right 08/14/2013   Procedure: CYSTOSCOPY WITH URETEROSCOPY BLADDER BIOPSY ;  Surgeon: Claybon Jabs, MD;  Location: [facility];  Service: Urology;  Laterality: Right;   ESOPHAGOGASTRODUODENOSCOPY N/A 02/06/2013   Procedure: ESOPHAGOGASTRODUODENOSCOPY (EGD);  Surgeon: Lafayette Dragon, MD;  Location[facility];  Service: Endoscopy;  Laterality: N/A;   LAPAROSCOPY N/A 11/25/2014   Procedure: LAPAROSCOPIC PRIMARY REPAIR OF PERFORATED PREPYLORIC ULCER WITH Silvestre Gunner;  Surgeon: Greer Pickerel, MD;  Location: [place];  Service: General;  Laterality: N/A;   TONSILLECTOMY  as child   TRANSTHORACIC ECHOCARDIOGRAM  02-17-2011   MODERATE LVH/  EF 65%   TRANSURETHRAL RESECTION OF BLADDER TUMOR WITH GYRUS (TURBT-GYRUS) N/A 06/12/2013   Procedure: TRANSURETHRAL RESECTION OF BLADDER TUMOR WITH GYRUS (TURBT-GYRUS);  Surgeon: Claybon Jabs, MD;  Location[facility];  Service: Urology;  Laterality: N/A;   TYMPANIC MEMBRANE REPAIR  as child     Home Medications:  Prior to Admission medications   Medication Sig Start Date End Date Taking? Authorizing Provider  albuterol (VENTOLIN HFA) 108 (90 Base) MCG/ACT inhaler Inhale 2 puffs into the lungs every 6 (six)  hours as needed for wheezing or shortness of breath. 08/18/21   Dwyane Dee, MD  cetirizine (ZYRTEC) 10 MG tablet Take 10 mg by mouth daily.    [provider]  doxycycline (VIBRA-TABS) 100 MG tablet Take 1 tablet (100 mg total) by mouth 2 (two) times daily. 11/13/21   Collene Gobble, MD  empagliflozin (JARDIANCE) 10 MG TABS tablet Take 1 tablet (10 mg total) by mouth daily before breakfast. 10/30/21   Hassell Done, [name], FNP  famotidine (PEPCID) 20 MG tablet Take 1 tablet (20 mg total) by mouth every morning. Reported on 04/16/2015 10/30/21   Thomas Pretty, FNP  furosemide (LASIX) 40 MG tablet Take 0.5 tablets (20 mg total) by mouth daily. 11/19/21   Thomas Heinz, MD  HYDROcodone-acetaminophen (NORCO/VICODIN) 5-325 MG tablet Take 1 tablet by mouth 2 (two) times daily as needed for severe pain. 11/24/21   Durel Salts C, DO  insulin glargine (LANTUS) 100 UNIT/ML injection Inject 0.2 mLs (20 Units total) into the skin daily. 10/30/21   Hassell Done, [name], FNP  Insulin Syringe-Needle U-100 (B-D INS SYR ULTRAFINE 1CC/30G) 30G X 1/2" 1 ML MISC Use 4 times a  day with insulin Dx E11.9 08/22/20   Thomas Pretty, FNP  Insulin Syringes, Disposable, U-100 1 ML MISC Use 4 times a day for insulin injection Dx E11.9 12/27/18   Hassell Done, [name], FNP  ipratropium-albuterol (DUONEB) 0.5-2.5 (3) MG/3ML SOLN Take 3 mLs by nebulization every 4 (four) hours as needed. 09/25/21   Gwenlyn Perking, FNP  levalbuterol Penne Lash) 0.31 MG/3ML nebulizer solution Take 3 mLs (0.31 mg total) by nebulization every 4 (four) hours as needed for wheezing. 09/25/21   Gwenlyn Perking, FNP  losartan (COZAAR) 50 MG tablet Take 1 tablet (50 mg total) by mouth at bedtime. hold this medication until follow up 10/30/21   Thomas Pretty, FNP  metoprolol succinate (TOPROL-XL) 100 MG 24 hr tablet Take 1 tablet (100 mg total) by mouth daily. Take with or immediately following a meal. 10/30/21 10/25/22   Hassell Done, [name], FNP  naloxone [facility]) nasal spray 4 mg/0.1 mL Place 1 spray into the nose as needed (For opiate overdose). 11/17/21   Durel Salts C, DO  NOVOLOG 100 UNIT/ML injection PER SLIDING SCALE: 190 - 200 = 2 UNITS. 300 AND ABOVE = 7 UNITS. 10/20/21   Hassell Done, [name], FNP  OVER THE COUNTER MEDICATION Take 1 Scoop by mouth in the morning and at bedtime. Super Beets    [provider]  rivaroxaban (XARELTO) 20 MG TABS tablet Take 1 tablet (20 mg total) by mouth daily with supper. 10/30/21   Thomas Pretty, FNP    Inpatient Medications: Scheduled Meds:  Continuous Infusions:  PRN Meds:   Allergies:    Allergies  Allergen Reactions   Azithromycin Other (See Comments) and Nausea And Vomiting    "Severe stomach cramps; told to list as an allergy by dr. Huel Cote ago"   Avelox [Moxifloxacin Hcl In Nacl] Other (See Comments)    Hemolysis  In 2012   Bactrim [Sulfamethoxazole-Trimethoprim] Diarrhea and Nausea And Vomiting   Moxifloxacin Other (See Comments)   Sulfamethoxazole-Trimethoprim Other (See Comments)    Social History:   Social History   Socioeconomic History   Marital status: Married    Spouse name: Not on file   Number of children: Not on file   Years of education: Not on file   Highest education level: Not on file  Occupational History   Occupation: Tobacco Farmer  Tobacco Use   Smoking status: Every Day    Packs/day: 2.00    Years: 30.00    Total pack years: 60.00    Types: Cigarettes   Smokeless tobacco: Former    Quit date: 06/08/1978   Tobacco comments:    Couple packs per day 11/04/21  Vaping Use   Vaping Use: Never used  Substance and Sexual Activity   Alcohol use: Not Currently    Comment: 5 quarts per week   Drug use: No   Sexual activity: Not on file  Other Topics Concern   Not on file  Social History Narrative   Lives in [place] with wife and 2 sons.    Social Determinants of Health   Financial Resource Strain:  Not on file  Food Insecurity: Not on file  Transportation Needs: Not on file  Physical Activity: Not on file  Stress: Not on file  Social Connections: Not on file  Intimate Partner Violence: Not on file    Family History:    Family History  Problem Relation Age of Onset   Breast cancer Mother    Cancer Mother        Breast  Rheumatic fever Father    Heart disease Father    Heart attack Father        Massive    Diabetes Son      ROS:  Please see the history of present illness.  Unable to obtain additional review of systems since the patient is encephalopathic All other ROS reviewed and negative.     Physical Exam/Data:   Vitals:   11/27/21 1057 11/27/21 1058 11/27/21 1059 11/27/21 1100  BP:    117/65  Pulse: (!) 104 (!) 109 (!) 109 (!) 107  Resp: (!) 23 20 (!) 34 (!) 25  Temp:    (!) 97.1 F (36.2 C)  TempSrc:    Rectal  SpO2: 93% 95% 92% 96%   No intake or output data in the 24 hours ending 11/27/21 1111    11/19/2021    1:21 PM 11/04/2021    3:02 PM 10/30/2021    3:31 PM  Last 3 Weights  Weight (lbs) 180 lb 3.2 oz 175 lb 9.6 oz 171 lb  Weight (kg) 81.738 kg 79.652 kg 77.565 kg     There is no height or weight on file to calculate BMI.  General:  Well nourished, well developed, in no acute distress HEENT: normal.  Appears to have laceration to the right side of his tongue Neck: no JVD Vascular: No carotid bruits; Distal pulses 2+ bilaterally Cardiac:  normal S1, S2; RRR; no murmur  Lungs:  clear to auscultation bilaterally, no wheezing, rhonchi or rales  Abd: soft, nontender, no hepatomegaly  Ext: no edema Musculoskeletal:  No deformities, BUE and BLE strength normal and equal Skin: warm and dry extremities are warm. Neuro: Limited exam is nonfocal.  He is stuporous.  He can be aroused any answers short questions.  Oriented to self, unclear if he is otherwise oriented. Psych:  Normal affect   EKG:  The EKG was personally reviewed and demonstrates:    Sinus  tachycardia, diffuse ST segment depression in virtually all leads except for subtle ST segment elevation in aVR.  Repeat ECG shows persistent but improved ST segment changes. Telemetry:  Telemetry was personally reviewed and demonstrates: Sinus tachycardia  Relevant CV Studies: Echo 05/21/2021   1. Left ventricular ejection fraction, by estimation, is 55 to 60%. The  left ventricle has normal function. The left ventricle has no regional  wall motion abnormalities. Left ventricular diastolic parameters are  consistent with Grade II diastolic  dysfunction (pseudonormalization). Elevated left ventricular end-diastolic  pressure.   2. Right ventricular systolic function is normal. The right ventricular  size is normal. There is normal pulmonary artery systolic pressure.   3. The mitral valve is normal in structure. No evidence of mitral valve  regurgitation. No evidence of mitral stenosis.   4. The aortic valve is tricuspid. Aortic valve regurgitation is not  visualized. No aortic stenosis is present.   5. The inferior vena cava is normal in size with greater than 50%  respiratory variability, suggesting right atrial pressure of 3 mmHg.   Laboratory Data:  High Sensitivity Troponin:  No results for input(s): "TROPONINIHS" in the last 720 hours.   Chemistry Recent Labs  Lab 11/27/21 1049  NA 141  K 3.3*  CL 101  GLUCOSE 35*  BUN 14  CREATININE 1.50*    No results for input(s): "PROT", "ALBUMIN", "AST", "ALT", "ALKPHOS", "BILITOT" in the last 168 hours. Lipids No results for input(s): "Sandoval", "TRIG", "HDL", "LABVLDL", "LDLCALC", "CHOLHDL" in the last 168 hours.  Hematology  Recent Labs  Lab 11/27/21 1045 11/27/21 1049  WBC 10.8*  --   RBC 4.26  --   HGB 14.4 15.0  HCT 43.7 44.0  MCV 102.6*  --   MCH 33.8  --   MCHC 33.0  --   RDW 14.8  --   PLT 212  --    Thyroid No results for input(s): "TSH", "FREET4" in the last 168 hours.  BNPNo results for input(s): "BNP", "PROBNP"  in the last 168 hours.  DDimer No results for input(s): "DDIMER" in the last 168 hours.   Radiology/Studies:  No results found.   Assessment and Plan:   Abnormal ECG: It is quite likely that he has CAD, considering his risk factors and the fact that he has PAD. ECG pattern suggest global myocardial ischemia.  Based on the electrocardiogram, it is possible that he has left main coronary artery stenosis, but the presentation would be highly atypical for an acute coronary syndrome.  He is not experiencing angina.  Presentation with encephalopathy, severe hypotension, recurrent hypoglycemia suggest toxic/septic syndrome.  Although is highly likely he has CAD, I do not think it would be the right approach to take him to the Cath Lab now.  Biomarkers are likely to be abnormal.  It is not unreasonable to treat him with intravenous heparin once we have excluded active bleeding. Encephalopathy: Currently hypoglycemic.  Possible seizure reported by family.  History of alcohol abuse and active opioid use.  Work-up in progress.  Labs pending CHF: Had transient LV dysfunction due to tachycardia cardiomyopathy, completely resolved by echo performed in March 2023. Parox AFib: Recurrent events in 2022, initially during acute pancreatitis, but with persistent atrial fibrillation in December 2022.  No recent clinical episodes of atrial fibrillation.  Unclear whether he has been compliant with Xarelto prescription. HTN: Was markedly hypotensive earlier today, but now maintaining normal blood pressure.  Chronically on ARB and beta-blocker.   Risk Assessment/Risk Scores:     TIMI Risk Score for Unstable Angina or Non-ST Elevation MI:   The patient's TIMI risk score is 3, which indicates a 13% risk of all cause mortality, new or recurrent myocardial infarction or need for urgent revascularization in the next 14 days.  New York Heart Association (NYHA) Functional Class NYHA Class II (as of last office  visit)  CHA2DS2-VASc Score = 4   This indicates a 4.8% annual risk of stroke. The patient's score is based upon: CHF History: 1 HTN History: 1 Diabetes History: 1 Stroke History: 0 Vascular Disease History: 1 Age Score: 0 Gender Score: 0         For questions or updates, please contact [place] Please consult [email or web address] for contact info under    Signed, Sanda Klein, MD  11/27/2021 11:11 AM

## 2021-11-27 NOTE — ED Provider Notes (Signed)
[facility] EMERGENCY DEPARTMENT Provider Note   CSN: [id number] Arrival date & time: 11/27/21  1034     History  Chief Complaint  Patient presents with   Stemi   Unresponsive    Thomas Demore. is a 66 y.o. male.  HPI 66 year old male presents with altered mental status.  EMS reports they were called out for seizure-like activity and altered mental state.  Glucose was initially in the 40s and they gave him glucose.  Came up into the 200s.  However he was still altered.  They gave him a dose of Narcan as he has been known to have taken too many hydrocodone's.  His ECG resembled a STEMI and so he was called a code STEMI and brought emergently to this hospital.  Patient has a history of alcohol abuse though family reported he is no longer using alcohol.  There was reportedly blood coming out of his mouth for EMS.  He is a little more awake than when he was with EMS.  Patient will awaken to light touch but then seems to quickly fall back asleep and thus I am unable to get history from him.  Home Medications Prior to Admission medications   Medication Sig Start Date End Date Taking? Authorizing Provider  albuterol (VENTOLIN HFA) 108 (90 Base) MCG/ACT inhaler Inhale 2 puffs into the lungs every 6 (six) hours as needed for wheezing or shortness of breath. 08/18/21   Dwyane Dee, MD  cetirizine (ZYRTEC) 10 MG tablet Take 10 mg by mouth daily.    [provider]  doxycycline (VIBRA-TABS) 100 MG tablet Take 1 tablet (100 mg total) by mouth 2 (two) times daily. 11/13/21   Collene Gobble, MD  empagliflozin (JARDIANCE) 10 MG TABS tablet Take 1 tablet (10 mg total) by mouth daily before breakfast. 10/30/21   Hassell Done, [name], FNP  famotidine (PEPCID) 20 MG tablet Take 1 tablet (20 mg total) by mouth every morning. Reported on 04/16/2015 10/30/21   Chevis Pretty, FNP  furosemide (LASIX) 40 MG tablet Take 0.5 tablets (20 mg total) by mouth daily. 11/19/21    Donato Heinz, MD  HYDROcodone-acetaminophen (NORCO/VICODIN) 5-325 MG tablet Take 1 tablet by mouth 2 (two) times daily as needed for severe pain. 11/24/21   Durel Salts C, DO  insulin glargine (LANTUS) 100 UNIT/ML injection Inject 0.2 mLs (20 Units total) into the skin daily. 10/30/21   Hassell Done [name], FNP  Insulin Syringe-Needle U-100 (B-D INS SYR ULTRAFINE 1CC/30G) 30G X 1/2" 1 ML MISC Use 4 times a day with insulin Dx E11.9 08/22/20   Hassell Done, [name], FNP  Insulin Syringes, Disposable, U-100 1 ML MISC Use 4 times a day for insulin injection Dx E11.9 12/27/18   Hassell Done, [name], FNP  ipratropium-albuterol (DUONEB) 0.5-2.5 (3) MG/3ML SOLN Take 3 mLs by nebulization every 4 (four) hours as needed. 09/25/21   Gwenlyn Perking, FNP  levalbuterol Penne Lash) 0.31 MG/3ML nebulizer solution Take 3 mLs (0.31 mg total) by nebulization every 4 (four) hours as needed for wheezing. 09/25/21   Gwenlyn Perking, FNP  losartan (COZAAR) 50 MG tablet Take 1 tablet (50 mg total) by mouth at bedtime. hold this medication until follow up 10/30/21   Chevis Pretty, FNP  metoprolol succinate (TOPROL-XL) 100 MG 24 hr tablet Take 1 tablet (100 mg total) by mouth daily. Take with or immediately following a meal. 10/30/21 10/25/22  Hassell Done, [name], FNP  naloxone [facility] nasal spray 4 mg/0.1 mL Place 1 spray into  the nose as needed (For opiate overdose). 11/17/21   Durel Salts C, DO  NOVOLOG 100 UNIT/ML injection PER SLIDING SCALE: 190 - 200 = 2 UNITS. 300 AND ABOVE = 7 UNITS. 10/20/21   Hassell Done, [name], FNP  OVER THE COUNTER MEDICATION Take 1 Scoop by mouth in the morning and at bedtime. Super Beets    [provider]  rivaroxaban (XARELTO) 20 MG TABS tablet Take 1 tablet (20 mg total) by mouth daily with supper. 10/30/21   Hassell Done [name], FNP      Allergies    Azithromycin, Avelox [moxifloxacin hcl in nacl], Bactrim [sulfamethoxazole-trimethoprim], Moxifloxacin,  and Sulfamethoxazole-trimethoprim    Review of Systems   Review of Systems  Unable to perform ROS: Mental status change    Physical Exam Updated Vital Signs BP (!) 146/83   Pulse 93   Temp (!) 97.1 F (36.2 C) (Rectal)   Resp (!) 21   SpO2 99%  Physical Exam Vitals and nursing note reviewed.  Constitutional:      Appearance: He is well-developed.  HENT:     Head: Normocephalic and atraumatic.     Mouth/Throat:     Comments: Dried blood on tongue Cardiovascular:     Rate and Rhythm: Regular rhythm. Tachycardia present.     Heart sounds: Normal heart sounds.  Pulmonary:     Effort: Pulmonary effort is normal. Tachypnea present. No accessory muscle usage.     Breath sounds: Normal breath sounds.  Abdominal:     Palpations: Abdomen is soft.     Tenderness: There is no abdominal tenderness.  Skin:    General: Skin is warm and dry.  Neurological:     Mental Status: He is lethargic.     Comments: Patient is lethargic. Awakens but quickly falls back asleep. Seems to move all 4 extremities though isn't really following commands.     ED Results / Procedures / Treatments   Labs (all labs ordered are listed, but only abnormal results are displayed) Labs Reviewed  COMPREHENSIVE METABOLIC PANEL - Abnormal; Notable for the following components:      Result Value   Potassium 3.3 (*)    Glucose, Bld 43 (*)    Creatinine, Ser 1.51 (*)    Albumin 3.2 (*)    GFR, Estimated 51 (*)    All other components within normal limits  CBC WITH DIFFERENTIAL/PLATELET - Abnormal; Notable for the following components:   WBC 10.8 (*)    MCV 102.6 (*)    Monocytes Absolute 1.1 (*)    Abs Immature Granulocytes 0.09 (*)    All other components within normal limits  PROTIME-INR - Abnormal; Notable for the following components:   Prothrombin Time 21.9 (*)    INR 1.9 (*)    All other components within normal limits  I-STAT CHEM 8, ED - Abnormal; Notable for the following components:   Potassium  3.3 (*)    Creatinine, Ser 1.50 (*)    Glucose, Bld 35 (*)    All other components within normal limits  CBG MONITORING, ED - Abnormal; Notable for the following components:   Glucose-Capillary 25 (*)    All other components within normal limits  CBG MONITORING, ED - Abnormal; Notable for the following components:   Glucose-Capillary 143 (*)    All other components within normal limits  CBG MONITORING, ED - Abnormal; Notable for the following components:   Glucose-Capillary 59 (*)    All other components within normal limits  RESP PANEL BY RT-PCR (  FLU A&B, COVID) ARPGX2  MAGNESIUM  ETHANOL  RAPID URINE DRUG SCREEN, HOSP PERFORMED  URINALYSIS, ROUTINE W REFLEX MICROSCOPIC  CBG MONITORING, ED  CBG MONITORING, ED  TROPONIN I (HIGH SENSITIVITY)  TROPONIN I (HIGH SENSITIVITY)    EKG EKG Interpretation  Date/Time:  Thursday November 27 2021 11:52:44 EDT Ventricular Rate:  88 PR Interval:    QRS Duration: 79 QT Interval:  399 QTC Calculation: 491 R Axis:   -40 Text Interpretation: Atrial fibrillation Ventricular premature complex Left axis deviation Minimal ST depression, inferior leads Borderline prolonged QT interval Confirmed by Sherwood Gambler [phone]) on 11/27/2021 12:08:22 PM  Radiology CT CHEST WO CONTRAST  Result Date: 11/27/2021 CLINICAL DATA:  Pneumonia. EXAM: CT CHEST WITHOUT CONTRAST TECHNIQUE: Multidetector CT imaging of the chest was performed following the standard protocol without IV contrast. RADIATION DOSE REDUCTION: This exam was performed according to the departmental dose-optimization program which includes automated exposure control, adjustment of the mA and/or kV according to patient size and/or use of iterative reconstruction technique. COMPARISON:  August 17, 2021. FINDINGS: Cardiovascular: Atherosclerosis of thoracic aorta is noted without aneurysm formation. Normal cardiac size. No pericardial effusion. Coronary artery calcifications are noted.  Mediastinum/Nodes: No enlarged mediastinal or axillary lymph nodes. Thyroid gland, trachea, and esophagus demonstrate no significant findings. Lungs/Pleura: No pneumothorax or pleural effusion is noted. Stable biapical scarring is noted. Right lower lobe airspace opacity noted on prior exam is significantly improved, with residual opacity remaining most consistent with residual inflammation or postinfectious scarring. Minimal left posterior basilar subsegmental atelectasis is noted. 16 x 13 mm cavitating nodule is noted in left upper lobe which is not significantly changed compared to prior exam. Stable 11 x 7 mm irregular density is noted in right upper lobe best seen on image number 86 of series 6. Stable 7 mm nodule is noted in left upper lobe best seen on image number 33 of series 6. Upper Abdomen: No acute abnormality. Musculoskeletal: No chest wall mass or suspicious bone lesions identified. IMPRESSION: Grossly stable 16 x 13 mm cavitary nodule seen in left upper lobe. It is uncertain if this represents cavitary pneumonia or malignancy. Consider one of the following in 3 months for both low-risk and high-risk individuals: (a) repeat chest CT, (b) follow-up PET-CT, or (c) tissue sampling. This recommendation follows the consensus statement: Guidelines for Management of Incidental Pulmonary Nodules Detected on CT Images: From the Fleischner Society 2017; Radiology 2017; 284:228-243. Right lower lobe airspace opacity is noted which is significantly decreased compared to prior exam, and most likely represents residual inflammation or postinfectious scarring. Stable nodules are noted in the right upper and left upper lobes compared to prior exam. Attention to these on follow-up imaging is recommended. Coronary artery calcifications are noted suggesting coronary artery disease. Aortic Atherosclerosis (ICD10-I70.0). Electronically Signed   By: Marijo Conception M.D.   On: 11/27/2021 13:44   CT Head Wo  Contrast  Result Date: 11/27/2021 CLINICAL DATA:  Seizure EXAM: CT HEAD WITHOUT CONTRAST TECHNIQUE: Contiguous axial images were obtained from the base of the skull through the vertex without intravenous contrast. RADIATION DOSE REDUCTION: This exam was performed according to the departmental dose-optimization program which includes automated exposure control, adjustment of the mA and/or kV according to patient size and/or use of iterative reconstruction technique. COMPARISON:  None Available. FINDINGS: Brain: No evidence of acute infarction, hemorrhage, hydrocephalus, extra-axial collection or mass lesion/mass effect. Scattered low-density changes within the periventricular and subcortical white matter compatible with chronic microvascular ischemic change. Mild diffuse  cerebral volume loss. Vascular: Atherosclerotic calcifications involving the large vessels of the skull base. No unexpected hyperdense vessel. Skull: Normal. Negative for fracture or focal lesion. Sinuses/Orbits: No acute finding. Other: None. IMPRESSION: 1. No acute intracranial abnormality. 2. Mild chronic microvascular ischemic change and cerebral volume loss. Electronically Signed   By: Davina Poke D.O.   On: 11/27/2021 11:28   DG Chest Portable 1 View  Result Date: 11/27/2021 CLINICAL DATA:  Possible myocardial infarction. EXAM: PORTABLE CHEST 1 VIEW COMPARISON:  Radiographs 09/25/2021 and 08/16/2021.  CT 08/17/2021. FINDINGS: 1105 hours. Stable mild cardiomegaly. Diffuse reticulonodular interstitial prominence throughout both lungs and biapical scarring are unchanged. No superimposed edema, confluent airspace opacity, pleural effusion or pneumothorax. The bones appear unremarkable.  Telemetry leads overlie the chest. IMPRESSION: Stable radiographic appearance of the chest with chronic lung disease. No acute findings identified. Electronically Signed   By: Richardean Sale M.D.   On: 11/27/2021 11:17    Procedures .Critical  Care  Performed by: Sherwood Gambler, MD Authorized by: Sherwood Gambler, MD   Critical care provider statement:    Critical care time (minutes):  40   Critical care time was exclusive of:  Separately billable procedures and treating other patients   Critical care was necessary to treat or prevent imminent or life-threatening deterioration of the following conditions:  CNS failure or compromise, cardiac failure and metabolic crisis   Critical care was time spent personally by me on the following activities:  Development of treatment plan with patient or surrogate, discussions with consultants, evaluation of patient's response to treatment, examination of patient, ordering and review of laboratory studies, ordering and review of radiographic studies, ordering and performing treatments and interventions, pulse oximetry, re-evaluation of patient's condition and review of old charts     Medications Ordered in ED Medications  dextrose 10 % and 0.45 % NaCl infusion (has no administration in time range)  sodium chloride flush (NS) 0.9 % injection 3 mL (3 mLs Intravenous Given 11/27/21 1302)  sodium chloride flush (NS) 0.9 % injection 3 mL (has no administration in time range)  0.9 %  sodium chloride infusion (has no administration in time range)  dextrose 5 %-0.9 % sodium chloride infusion ( Intravenous Infusion Verify 11/27/21 1304)  metoprolol tartrate (LOPRESSOR) tablet 50 mg (has no administration in time range)  famotidine (PEPCID) tablet 20 mg (has no administration in time range)  rivaroxaban (XARELTO) tablet 20 mg (has no administration in time range)  albuterol (VENTOLIN HFA) 108 (90 Base) MCG/ACT inhaler 2 puff (has no administration in time range)  loratadine (CLARITIN) tablet 10 mg (has no administration in time range)  ipratropium-albuterol (DUONEB) 0.5-2.5 (3) MG/3ML nebulizer solution 3 mL (has no administration in time range)  insulin aspart (novoLOG) injection 0-15 Units (has no  administration in time range)  ondansetron (ZOFRAN) tablet 4 mg (has no administration in time range)    Or  ondansetron (ZOFRAN) injection 4 mg (has no administration in time range)  LORazepam (ATIVAN) tablet 1-4 mg (has no administration in time range)    Or  LORazepam (ATIVAN) injection 1-4 mg (has no administration in time range)  thiamine (VITAMIN B1) tablet 100 mg (has no administration in time range)    Or  thiamine (VITAMIN B1) injection 100 mg (has no administration in time range)  folic acid (FOLVITE) tablet 1 mg (has no administration in time range)  multivitamin with minerals tablet 1 tablet (has no administration in time range)  methylPREDNISolone sodium succinate (SOLU-MEDROL) 40 mg/mL injection  40 mg (has no administration in time range)  budesonide (PULMICORT) nebulizer solution 0.25 mg (has no administration in time range)  dextrose 50 % solution 50 mL (50 mLs Intravenous Given 11/27/21 1052)  dextrose 50 % solution 25 mL (25 mLs Intravenous Given 11/27/21 1207)    ED Course/ Medical Decision Making/ A&P                           Medical Decision Making Amount and/or Complexity of Data Reviewed Independent Historian: spouse and EMS    Details: That he has been drinking some though she is not sure how much.  She also reports she gave him insulin this morning but is not sure that he actually ate.  He she was not there when he actually seized. Labs: ordered.    Details: Initial glucose in the 20s.  Mild hypokalemia.  Initial troponin is not distant with ACS.  COVID testing negative Radiology: ordered and independent interpretation performed.    Details: CT head without head bleed.  No pneumonia on x-ray. ECG/medicine tests: independent interpretation performed.    Details: Nonspecific ST changes.  Improved compared to EMS ECGs.  Risk Prescription drug management. Decision regarding hospitalization.   Patient is lethargic on arrival.  Found to have glucose of 25.   After being given glucose he is much more awake.  Wife informs me that she actually gave him insulin this morning, and he did not eat.  This is likely accounting for why he is so hypoglycemic.  Cardiology was at bedside on arrival given concern for STEMI/acute ischemia.  However does not seem like he is having any chest symptoms and his significantly abnormal ECG (from EMS and our first one - which did not cross into MUSE) was likely secondary to his metabolic derangements.  Repeat ECG here shows improvement.  He continues to have no chest pain.  I do not think this is ACS.  He did seem to have a probable seizure, which was likely glucose related.  CT head is unremarkable.  Cards has evaluated and for now no cath lab or heparin. No further seizures. Was placed on D10 given recent hypoglycemia (though not as dramatic) in the ED. Will be admitted. Discussed with Dr. Roosevelt Locks.        Final Clinical Impression(s) / ED Diagnoses Final diagnoses:  Hypoglycemia    Rx / DC Orders ED Discharge Orders     None         Sherwood Gambler, MD 11/27/21 1459

## 2021-11-27 NOTE — ED Notes (Addendum)
On the phone now to provide verbal report after attempting x2 via messenger.

## 2021-11-27 NOTE — Telephone Encounter (Signed)
Left detailed voicemail informing patient that the Hydrocodone needed a prior authorization and it was approved. Informed patient that Dr. Tressa Busman is out of the office until 12/01/21 and will address the Tramadol once she returns

## 2021-11-27 NOTE — ED Notes (Signed)
X-ray at bedside

## 2021-11-27 NOTE — ED Notes (Signed)
Patient transported to CT 

## 2021-11-27 NOTE — ED Notes (Signed)
Pt remains drowsy. Will hold off on giving PO until pt is more awake.

## 2021-11-28 DIAGNOSIS — I10 Essential (primary) hypertension: Secondary | ICD-10-CM

## 2021-11-28 DIAGNOSIS — J449 Chronic obstructive pulmonary disease, unspecified: Secondary | ICD-10-CM

## 2021-11-28 DIAGNOSIS — I480 Paroxysmal atrial fibrillation: Secondary | ICD-10-CM | POA: Diagnosis not present

## 2021-11-28 DIAGNOSIS — E162 Hypoglycemia, unspecified: Secondary | ICD-10-CM | POA: Diagnosis not present

## 2021-11-28 DIAGNOSIS — G9341 Metabolic encephalopathy: Secondary | ICD-10-CM | POA: Diagnosis not present

## 2021-11-28 DIAGNOSIS — Z20822 Contact with and (suspected) exposure to covid-19: Secondary | ICD-10-CM | POA: Diagnosis not present

## 2021-11-28 DIAGNOSIS — G9340 Encephalopathy, unspecified: Secondary | ICD-10-CM | POA: Diagnosis not present

## 2021-11-28 DIAGNOSIS — F1010 Alcohol abuse, uncomplicated: Secondary | ICD-10-CM

## 2021-11-28 DIAGNOSIS — Z794 Long term (current) use of insulin: Secondary | ICD-10-CM

## 2021-11-28 DIAGNOSIS — E119 Type 2 diabetes mellitus without complications: Secondary | ICD-10-CM

## 2021-11-28 LAB — BASIC METABOLIC PANEL
Anion gap: 9 (ref 5–15)
BUN: 12 mg/dL (ref 8–23)
CO2: 23 mmol/L (ref 22–32)
Calcium: 8.8 mg/dL — ABNORMAL LOW (ref 8.9–10.3)
Chloride: 103 mmol/L (ref 98–111)
Creatinine, Ser: 1.19 mg/dL (ref 0.61–1.24)
GFR, Estimated: >60 mL/min (ref >60)
Glucose, Bld: 226 mg/dL — ABNORMAL HIGH (ref 70–99)
Potassium: 4.2 mmol/L (ref 3.5–5.1)
Sodium: 135 mmol/L (ref 135–145)

## 2021-11-28 LAB — GLUCOSE, CAPILLARY
Glucose-Capillary: 287 mg/dL — ABNORMAL HIGH (ref 70–99)
Glucose-Capillary: 201 mg/dL — ABNORMAL HIGH (ref 70–99)

## 2021-11-28 NOTE — Progress Notes (Addendum)
Rounding Note    Patient Name: Thomas Sandoval. Date of Encounter: 11/28/2021  [facility] Cardiologist: Donato Heinz, MD   Subjective   Patient is sitting upright in bed on my exam this morning. He reports feeling 100% back to normal this morning and is very anxious to discharge home. Given patient's acute mental status change yesterday, HPI was limited. Today he reports no recent symptoms of chest pain, exertional intolerance, lightheadedness/dizziness prior to yesterday's acute event.  Inpatient Medications    Scheduled Meds:  budesonide (PULMICORT) nebulizer solution  0.25 mg Nebulization BID   famotidine  20 mg Oral q morning   folic acid  1 mg Oral Daily   insulin aspart  0-15 Units Subcutaneous TID WC   loratadine  10 mg Oral Daily   losartan  100 mg Oral Daily   methylPREDNISolone (SOLU-MEDROL) injection  40 mg Intravenous Q12H   metoprolol tartrate  50 mg Oral BID   multivitamin with minerals  1 tablet Oral Daily   rivaroxaban  20 mg Oral Q supper   sodium chloride flush  3 mL Intravenous Q12H   thiamine  100 mg Oral Daily   Or   thiamine  100 mg Intravenous Daily   Continuous Infusions:  sodium chloride     PRN Meds: sodium chloride, albuterol, HYDROcodone-acetaminophen, ipratropium-albuterol, labetalol, LORazepam **OR** LORazepam, ondansetron **OR** ondansetron (ZOFRAN) IV, sodium chloride flush   Vital Signs    Vitals:   11/27/21 2134 11/27/21 2138 11/28/21 0736 11/28/21 0746  BP:  (!) 148/77 (!) 166/81 (!) 166/81  Pulse:  77 72   Resp:   15 16  Temp:   98.2 F (36.8 C)   TempSrc:   Oral   SpO2:   100% 100%  Weight: 83.2 kg     Height: '5\' 9"'$  (1.753 m)       Intake/Output Summary (Last 24 hours) at 11/28/2021 1149 Last data filed at 11/28/2021 1109 Gross per 24 hour  Intake 0.29 ml  Output 1800 ml  Net -1799.71 ml      11/27/2021    9:34 PM 11/19/2021    1:21 PM 11/04/2021    3:02 PM  Last 3 Weights  Weight (lbs) 183  lb 6.8 oz 180 lb 3.2 oz 175 lb 9.6 oz  Weight (kg) 83.2 kg 81.738 kg 79.652 kg      Telemetry    Sinus rhythm - Personally Reviewed  ECG    No ECG today, ordered  Physical Exam   GEN: No acute distress.   Neck: No JVD Cardiac: RRR, no murmurs, rubs, or gallops.  Respiratory: Bilateral rhonchi, clears with coughing. Bibasilar inspiratory wheezing. No rales/crackles. GI: Soft, nontender, non-distended  MS: No edema; No deformity. Neuro:  Nonfocal  Psych: Normal affect   Labs    High Sensitivity Troponin:   Recent Labs  Lab 11/27/21 1045 11/27/21 1255  TROPONINIHS 16 80*     Chemistry Recent Labs  Lab 11/27/21 1045 11/27/21 1049 11/28/21 0055  NA 140 141 135  K 3.3* 3.3* 4.2  CL 103 101 103  CO2 23  --  23  GLUCOSE 43* 35* 226*  BUN '13 14 12  '$ CREATININE 1.51* 1.50* 1.19  CALCIUM 9.6  --  8.8*  MG 1.9  --   --   PROT 6.6  --   --   ALBUMIN 3.2*  --   --   AST 27  --   --   ALT 12  --   --  ALKPHOS 53  --   --   BILITOT 0.6  --   --   GFRNONAA 51*  --  >60  ANIONGAP 14  --  9    Lipids No results for input(s): "CHOL", "TRIG", "HDL", "LABVLDL", "LDLCALC", "CHOLHDL" in the last 168 hours.  Hematology Recent Labs  Lab 11/27/21 1045 11/27/21 1049  WBC 10.8*  --   RBC 4.26  --   HGB 14.4 15.0  HCT 43.7 44.0  MCV 102.6*  --   MCH 33.8  --   MCHC 33.0  --   RDW 14.8  --   PLT 212  --    Thyroid No results for input(s): "TSH", "FREET4" in the last 168 hours.  BNPNo results for input(s): "BNP", "PROBNP" in the last 168 hours.  DDimer No results for input(s): "DDIMER" in the last 168 hours.   Radiology    CT CHEST WO CONTRAST  Result Date: 11/27/2021 CLINICAL DATA:  Pneumonia. EXAM: CT CHEST WITHOUT CONTRAST TECHNIQUE: Multidetector CT imaging of the chest was performed following the standard protocol without IV contrast. RADIATION DOSE REDUCTION: This exam was performed according to the departmental dose-optimization program which includes automated  exposure control, adjustment of the mA and/or kV according to patient size and/or use of iterative reconstruction technique. COMPARISON:  August 17, 2021. FINDINGS: Cardiovascular: Atherosclerosis of thoracic aorta is noted without aneurysm formation. Normal cardiac size. No pericardial effusion. Coronary artery calcifications are noted. Mediastinum/Nodes: No enlarged mediastinal or axillary lymph nodes. Thyroid gland, trachea, and esophagus demonstrate no significant findings. Lungs/Pleura: No pneumothorax or pleural effusion is noted. Stable biapical scarring is noted. Right lower lobe airspace opacity noted on prior exam is significantly improved, with residual opacity remaining most consistent with residual inflammation or postinfectious scarring. Minimal left posterior basilar subsegmental atelectasis is noted. 16 x 13 mm cavitating nodule is noted in left upper lobe which is not significantly changed compared to prior exam. Stable 11 x 7 mm irregular density is noted in right upper lobe best seen on image number 86 of series 6. Stable 7 mm nodule is noted in left upper lobe best seen on image number 33 of series 6. Upper Abdomen: No acute abnormality. Musculoskeletal: No chest wall mass or suspicious bone lesions identified. IMPRESSION: Grossly stable 16 x 13 mm cavitary nodule seen in left upper lobe. It is uncertain if this represents cavitary pneumonia or malignancy. Consider one of the following in 3 months for both low-risk and high-risk individuals: (a) repeat chest CT, (b) follow-up PET-CT, or (c) tissue sampling. This recommendation follows the consensus statement: Guidelines for Management of Incidental Pulmonary Nodules Detected on CT Images: From the Fleischner Society 2017; Radiology 2017; 284:228-243. Right lower lobe airspace opacity is noted which is significantly decreased compared to prior exam, and most likely represents residual inflammation or postinfectious scarring. Stable nodules are noted  in the right upper and left upper lobes compared to prior exam. Attention to these on follow-up imaging is recommended. Coronary artery calcifications are noted suggesting coronary artery disease. Aortic Atherosclerosis (ICD10-I70.0). Electronically Signed   By: Marijo Conception M.D.   On: 11/27/2021 13:44   CT Head Wo Contrast  Result Date: 11/27/2021 CLINICAL DATA:  Seizure EXAM: CT HEAD WITHOUT CONTRAST TECHNIQUE: Contiguous axial images were obtained from the base of the skull through the vertex without intravenous contrast. RADIATION DOSE REDUCTION: This exam was performed according to the departmental dose-optimization program which includes automated exposure control, adjustment of the mA and/or kV according  to patient size and/or use of iterative reconstruction technique. COMPARISON:  None Available. FINDINGS: Brain: No evidence of acute infarction, hemorrhage, hydrocephalus, extra-axial collection or mass lesion/mass effect. Scattered low-density changes within the periventricular and subcortical white matter compatible with chronic microvascular ischemic change. Mild diffuse cerebral volume loss. Vascular: Atherosclerotic calcifications involving the large vessels of the skull base. No unexpected hyperdense vessel. Skull: Normal. Negative for fracture or focal lesion. Sinuses/Orbits: No acute finding. Other: None. IMPRESSION: 1. No acute intracranial abnormality. 2. Mild chronic microvascular ischemic change and cerebral volume loss. Electronically Signed   By: Davina Poke D.O.   On: 11/27/2021 11:28   DG Chest Portable 1 View  Result Date: 11/27/2021 CLINICAL DATA:  Possible myocardial infarction. EXAM: PORTABLE CHEST 1 VIEW COMPARISON:  Radiographs 09/25/2021 and 08/16/2021.  CT 08/17/2021. FINDINGS: 1105 hours. Stable mild cardiomegaly. Diffuse reticulonodular interstitial prominence throughout both lungs and biapical scarring are unchanged. No superimposed edema, confluent airspace opacity,  pleural effusion or pneumothorax. The bones appear unremarkable.  Telemetry leads overlie the chest. IMPRESSION: Stable radiographic appearance of the chest with chronic lung disease. No acute findings identified. Electronically Signed   By: Richardean Sale M.D.   On: 11/27/2021 11:17    Cardiac Studies   05/21/2021 TTE  Indications:    Congestive Heart Failure I50.9     History:        Patient has prior history of Echocardiogram examinations,  most                  recent 12/05/2020. COPD, Mitral Valve Prolapse,  Arrythmias:Atrial                  Fibrillation; Risk Factors:Hypertension, Diabetes and  Current                  Smoker.     Sonographer:    Mikki Santee RDCS  Referring Phys: [id number] [place]     1. Left ventricular ejection fraction, by estimation, is 55 to 60%. The  left ventricle has normal function. The left ventricle has no regional  wall motion abnormalities. Left ventricular diastolic parameters are  consistent with Grade II diastolic  dysfunction (pseudonormalization). Elevated left ventricular end-diastolic  pressure.   2. Right ventricular systolic function is normal. The right ventricular  size is normal. There is normal pulmonary artery systolic pressure.   3. The mitral valve is normal in structure. No evidence of mitral valve  regurgitation. No evidence of mitral stenosis.   4. The aortic valve is tricuspid. Aortic valve regurgitation is not  visualized. No aortic stenosis is present.   5. The inferior vena cava is normal in size with greater than 50%  respiratory variability, suggesting right atrial pressure of 3 mmHg.   FINDINGS   Left Ventricle: Left ventricular ejection fraction, by estimation, is 55  to 60%. The left ventricle has normal function. The left ventricle has no  regional wall motion abnormalities. The left ventricular internal cavity  size was normal in size. There is   no left ventricular  hypertrophy. Left ventricular diastolic parameters  are consistent with Grade II diastolic dysfunction (pseudonormalization).  Elevated left ventricular end-diastolic pressure.   Right Ventricle: The right ventricular size is normal. No increase in  right ventricular wall thickness. Right ventricular systolic function is  normal. There is normal pulmonary artery systolic pressure. The tricuspid  regurgitant velocity is 1.87 m/s, and   with an assumed right atrial pressure of 3  mmHg, the estimated right  ventricular systolic pressure is 79.0 mmHg.   Left Atrium: Left atrial size was normal in size.   Right Atrium: Right atrial size was normal in size.   Pericardium: There is no evidence of pericardial effusion.   Mitral Valve: The mitral valve is normal in structure. No evidence of  mitral valve regurgitation. No evidence of mitral valve stenosis.   Tricuspid Valve: The tricuspid valve is normal in structure. Tricuspid  valve regurgitation is trivial. No evidence of tricuspid stenosis.   Aortic Valve: The aortic valve is tricuspid. Aortic valve regurgitation is  not visualized. No aortic stenosis is present.   Pulmonic Valve: The pulmonic valve was normal in structure. Pulmonic valve  regurgitation is trivial. No evidence of pulmonic stenosis.   Aorta: The aortic root is normal in size and structure.   Venous: The inferior vena cava is normal in size with greater than 50%  respiratory variability, suggesting right atrial pressure of 3 mmHg.   IAS/Shunts: No atrial level shunt detected by color flow Doppler.   Patient Profile     Thomas Sandoval. is a 66 y.o. male with a hx of paroxysmal atrial fibrillation and cardiomyopathy who is being seen 11/27/2021 for the evaluation of abnormal EKG at the request of Dr. Verner Chol. Yesterday patient was found unresponsive by family members at home. EMS was called and found patient unresponsive hypoglycemic with a glucose of 48 and  severely hypotensive. Glucose and oxygen was improved but patient remained unresponsive. ECG with EMS was notable for diffuse ST segment depression in all leads minus aVR. aVR had mild ST elevation. Cath lab initially activated but cath canceled after ECG showed decreased depression and HPI was clarified.    Assessment & Plan    Abnormal ECG Likely CAD  Patient with abnormal ECG suspicious for global myocardial ischemia with diffuse ST depression yesterday during an episode of profound hypoglycemia/unresponsiveness with suspected seizure (see EMS runsheet). Unusually, patient's ECG remained abnormal for a time after restoration of normal oxygen levels and glucose. With multiple risk factors, it is likely that patient has CAD, possible left main disease. However, given troponin of 16 and 80 and no chest pain, unlikely ACS.  Patient is without any symptoms of dyspnea or chest pain today. Would be reasonable to defer further CAD workup to outpatient setting with coronary CT angiogram. Follow up with cardiology scheduled.    Paroxysmal afib  Patient with recurrent afib noted throughout 2022, finally more persistent in December 2022. However patient denies known recent recurrences of afib aside from yesterday when ECGs demonstrated paroxysmal arrhythmia with afib (this was shortly after patient's hypoglycemic episode). Patient endorses compliance with Xarelto. 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 Continue both OAC and beta blocker. Outpatient follow up with afib clinic.   HTN  Patient hypotensive on arrival to [facility] ED but now with normal to hypertensive readings. Continue home Losartan '100mg'$  and Metoprolol Tartrate '50mg'$  BID.   CHF  Patient previously with reduced LV function that was noted normal in March of this year. Euvolemic on exam today. At the time LVEF was noted down, it was presumed  tachy-mediated though it appears patient left AMA before his workup was completed.  Encephalopathy  Patient alert and oriented today with a non-focal neurological exam. Alcohol blood test negative. UDS positive for opiates only (patient with current Rx for Norco). Management per primary team.  Diabetes Mellitus type 2  Continue management per primary team.      For questions or updates, please contact [place] Please consult [email or web address] for contact info under        Signed, Lily Kocher, PA-C  11/28/2021, 11:49 AM    I have seen and examined the patient along with Lily Kocher, PA-C .  I have reviewed the chart, notes and new data.  I agree with PA/NP's note.  Key new complaints: no CV complaints Key examination changes: poor pedal pulses, otherwise normal CV exam Key new findings / data: ECG back to normal  PLAN: Recommend outpatient coronary CT angiogram. During hypoglycemia/hypotension he had severe ischemic ECG changes, but the ECG normalized with glucose level normalization and cardiac enzymes were normal. He may have severe underlying CAD, possibly left main coronary stenosis. Should be on daily ASA 81 mg for PAD, whether or not CAD is identified.   EMS ECG     Sanda Klein, MD, [place] [phone] 11/28/2021, 3:50 PM

## 2021-11-28 NOTE — Discharge Summary (Signed)
Physician Discharge Summary   Patient: Thomas Sandoval. MRN: [id number] DOB: [birth year]  Admit date:     11/27/2021  Discharge date: 11/28/21  Discharge Physician: Corrie Mckusick Rhonda Vangieson   PCP: Chevis Pretty, FNP   Recommendations at discharge:   Patient left AGAINST MEDICAL ADVICE.  Discharge Diagnoses: Principal Problem:   Acute metabolic encephalopathy Active Problems:   Insulin dependent type 2 diabetes mellitus (HCC)   COPD with chronic bronchitis and emphysema ([place])   Essential hypertension   Hypoglycemia   Hypocalcemia   ETOH abuse  Resolved Problems:   * No resolved hospital problems. *  Hospital Course: Thomas Sandoval. is a 66 y.o. male with past medical history significant of IDDM, PAF on Xarelto, interstitial lung disease, COPD, HTN, cigar smoker, alcohol abuse, presented to the hospital with fatigue weakness seizure-like activity and altered mental status.  Family noted for sitting blood glucose levels at home and subsequently seizure-like activity.  EMS was called and patient was brought into the hospital obtunded.  He was tachycardic hypertensive with blood pressure of 60/40 and glucose of 40.  Multiple rounds of D50 was given.  EKG showed ST elevation and code STEMI was called in route to the the hospital.  In the ED patient was lethargic tachycardic with hypotension.  Cardiology evaluated the patient and EKG was thought to have no significant ST changes.  He was also given oxygen supplementation.  Chest x-ray without acute findings but chronic interstitial changes.  CT head scan was negative for acute findings. Patient was then considered for admission to the hospital for further evaluation and treatment.    Subsequently after admission patient decided to leave [name].  I explained to him the potential risk of leaving without optimizing his health condition but despite that he wished to leave.  He was advised to come to the hospital for  worsening symptoms.  Following conditions were addressed during hospitalization:  Acute metabolic encephalopathy Likely secondary to hypoglycemia.  Latest hemoglobin A1c of 5.2 indicating stringent blood glucose control.  Both long-acting and mealtime coverage were decreased.  Would benefit from outpatient endocrinology follow-up.  Patient was given dextrose push initially.  UDS positive for opiates.  Urinalysis without any signs of infection.   Seizure-like activity. Secondary to hypoglycemia.     SIRS Patient did have some hypoxia and possibility of early aspiration.  WBC at 10.8.  No fever.   Acute hypoxic respiratory failure likely secondary to COPD exacerbation/aspiration. Present on admission.  Concerns about aspiration.  CT of the chest showed stable 16 x 13 mm cavitary nodule in the left upper lobe.  Recommended 3 months of follow-up.  Right lower lobe opacity improved from previous.  Stable nodules in the right upper and left upper lobe.  Currently on 3 L of oxygen by nasal cannula.   Hypotension Resolved after IV fluids.  Blood pressure better.  Starting at home.   Acute COPD exacerbation Received DuoNeb, Pulmicort,  steroid.     Paroxysmal atrial fibrillation. On metoprolol Xarelto   EKG with ST changes.  Thought to be secondary to tachycardia.  Resolved at this time.  Troponins negative.  No chest pain.   HTN  on losartan and Lasix at home.   Alcohol abuse Patient was admitted and CIWA protocol was initiated   Insulin-dependent type 2 diabetes. Was put on sliding scale.   Consultants: None Procedures performed: None Disposition:  AGAINST MEDICAL ADVICE Diet recommendation:   DISCHARGE MEDICATION: Left against medical advice.  Discharge Exam: Filed Weights   11/27/21 2134  Weight: 83.2 kg   General:  Average built, not in obvious distress on nasal cannula oxygen 3 L/min HENT:   No scleral pallor or icterus noted. Oral mucosa is moist.  Chest:    Diminished breath sounds bilaterally. No crackles or wheezes.  CVS: S1 &S2 heard. No murmur.  Regular rate and rhythm. Abdomen: Soft, nontender, nondistended.  Bowel sounds are heard.   Extremities: No cyanosis, clubbing or edema.  Peripheral pulses are palpable. Psych: Alert, awake and communicative. CNS:  No cranial nerve deficits.  Power equal in all extremities.   Skin: Warm and dry.  No rashes noted.   Condition at discharge:  Left AGAINST MEDICAL ADVICE  The results of significant diagnostics from this hospitalization (including imaging, microbiology, ancillary and laboratory) are listed below for reference.   Imaging Studies: CT CHEST WO CONTRAST  Result Date: 11/27/2021 CLINICAL DATA:  Pneumonia. EXAM: CT CHEST WITHOUT CONTRAST TECHNIQUE: Multidetector CT imaging of the chest was performed following the standard protocol without IV contrast. RADIATION DOSE REDUCTION: This exam was performed according to the departmental dose-optimization program which includes automated exposure control, adjustment of the mA and/or kV according to patient size and/or use of iterative reconstruction technique. COMPARISON:  August 17, 2021. FINDINGS: Cardiovascular: Atherosclerosis of thoracic aorta is noted without aneurysm formation. Normal cardiac size. No pericardial effusion. Coronary artery calcifications are noted. Mediastinum/Nodes: No enlarged mediastinal or axillary lymph nodes. Thyroid gland, trachea, and esophagus demonstrate no significant findings. Lungs/Pleura: No pneumothorax or pleural effusion is noted. Stable biapical scarring is noted. Right lower lobe airspace opacity noted on prior exam is significantly improved, with residual opacity remaining most consistent with residual inflammation or postinfectious scarring. Minimal left posterior basilar subsegmental atelectasis is noted. 16 x 13 mm cavitating nodule is noted in left upper lobe which is not significantly changed compared to prior  exam. Stable 11 x 7 mm irregular density is noted in right upper lobe best seen on image number 86 of series 6. Stable 7 mm nodule is noted in left upper lobe best seen on image number 33 of series 6. Upper Abdomen: No acute abnormality. Musculoskeletal: No chest wall mass or suspicious bone lesions identified. IMPRESSION: Grossly stable 16 x 13 mm cavitary nodule seen in left upper lobe. It is uncertain if this represents cavitary pneumonia or malignancy. Consider one of the following in 3 months for both low-risk and high-risk individuals: (a) repeat chest CT, (b) follow-up PET-CT, or (c) tissue sampling. This recommendation follows the consensus statement: Guidelines for Management of Incidental Pulmonary Nodules Detected on CT Images: From the Fleischner Society 2017; Radiology 2017; 284:228-243. Right lower lobe airspace opacity is noted which is significantly decreased compared to prior exam, and most likely represents residual inflammation or postinfectious scarring. Stable nodules are noted in the right upper and left upper lobes compared to prior exam. Attention to these on follow-up imaging is recommended. Coronary artery calcifications are noted suggesting coronary artery disease. Aortic Atherosclerosis (ICD10-I70.0). Electronically Signed   By: Marijo Conception M.D.   On: 11/27/2021 13:44   CT Head Wo Contrast  Result Date: 11/27/2021 CLINICAL DATA:  Seizure EXAM: CT HEAD WITHOUT CONTRAST TECHNIQUE: Contiguous axial images were obtained from the base of the skull through the vertex without intravenous contrast. RADIATION DOSE REDUCTION: This exam was performed according to the departmental dose-optimization program which includes automated exposure control, adjustment of the mA and/or kV according to patient size and/or use of  iterative reconstruction technique. COMPARISON:  None Available. FINDINGS: Brain: No evidence of acute infarction, hemorrhage, hydrocephalus, extra-axial collection or mass  lesion/mass effect. Scattered low-density changes within the periventricular and subcortical white matter compatible with chronic microvascular ischemic change. Mild diffuse cerebral volume loss. Vascular: Atherosclerotic calcifications involving the large vessels of the skull base. No unexpected hyperdense vessel. Skull: Normal. Negative for fracture or focal lesion. Sinuses/Orbits: No acute finding. Other: None. IMPRESSION: 1. No acute intracranial abnormality. 2. Mild chronic microvascular ischemic change and cerebral volume loss. Electronically Signed   By: Davina Poke D.O.   On: 11/27/2021 11:28   DG Chest Portable 1 View  Result Date: 11/27/2021 CLINICAL DATA:  Possible myocardial infarction. EXAM: PORTABLE CHEST 1 VIEW COMPARISON:  Radiographs 09/25/2021 and 08/16/2021.  CT 08/17/2021. FINDINGS: 1105 hours. Stable mild cardiomegaly. Diffuse reticulonodular interstitial prominence throughout both lungs and biapical scarring are unchanged. No superimposed edema, confluent airspace opacity, pleural effusion or pneumothorax. The bones appear unremarkable.  Telemetry leads overlie the chest. IMPRESSION: Stable radiographic appearance of the chest with chronic lung disease. No acute findings identified. Electronically Signed   By: Richardean Sale M.D.   On: 11/27/2021 11:17    Microbiology: Results for orders placed or performed during the hospital encounter of 11/27/21  Resp Panel by RT-PCR (Flu A&B, Covid) Anterior Nasal Swab     Status: None   Collection Time: 11/27/21 10:40 AM   Specimen: Anterior Nasal Swab  Result Value Ref Range Status   SARS Coronavirus 2 by RT PCR NEGATIVE NEGATIVE Final    Comment: (NOTE) SARS-CoV-2 target nucleic acids are NOT DETECTED.  The SARS-CoV-2 RNA is generally detectable in upper respiratory specimens during the acute phase of infection. The lowest concentration of SARS-CoV-2 viral copies this assay can detect is 138 copies/mL. A negative result does not  preclude SARS-Cov-2 infection and should not be used as the sole basis for treatment or other patient management decisions. A negative result may occur with  improper specimen collection/handling, submission of specimen other than nasopharyngeal swab, presence of viral mutation(s) within the areas targeted by this assay, and inadequate number of viral copies(<138 copies/mL). A negative result must be combined with clinical observations, patient history, and epidemiological information. The expected result is Negative.  Fact Sheet for Patients:  EntrepreneurPulse.com.au  Fact Sheet for Healthcare Providers:  IncredibleEmployment.be  This test is no t yet approved or cleared by the [place] FDA and  has been authorized for detection and/or diagnosis of SARS-CoV-2 by FDA under an Emergency Use Authorization (EUA). This EUA will remain  in effect (meaning this test can be used) for the duration of the COVID-19 declaration under Section 564(b)(1) of the Act, 21 U.S.C.section 360bbb-3(b)(1), unless the authorization is terminated  or revoked sooner.       Influenza A by PCR NEGATIVE NEGATIVE Final   Influenza B by PCR NEGATIVE NEGATIVE Final    Comment: (NOTE) The Xpert Xpress SARS-CoV-2/FLU/RSV plus assay is intended as an aid in the diagnosis of influenza from Nasopharyngeal swab specimens and should not be used as a sole basis for treatment. Nasal washings and aspirates are unacceptable for Xpert Xpress SARS-CoV-2/FLU/RSV testing.  Fact Sheet for Patients: EntrepreneurPulse.com.au  Fact Sheet for Healthcare Providers: IncredibleEmployment.be  This test is not yet approved or cleared by the [place] FDA and has been authorized for detection and/or diagnosis of SARS-CoV-2 by FDA under an Emergency Use Authorization (EUA). This EUA will remain in effect (meaning this test can be used) for  the  duration of the COVID-19 declaration under Section 564(b)(1) of the Act, 21 U.S.C. section 360bbb-3(b)(1), unless the authorization is terminated or revoked.  Performed at [facility], [place] [address], [place], [place] [address]     Labs: CBC: Recent Labs  Lab 11/27/21 1045 11/27/21 1049  WBC 10.8*  --   NEUTROABS 7.6  --   HGB 14.4 15.0  HCT 43.7 44.0  MCV 102.6*  --   PLT 212  --    Basic Metabolic Panel: Recent Labs  Lab 11/27/21 1045 11/27/21 1049 11/28/21 0055  NA 140 141 135  K 3.3* 3.3* 4.2  CL 103 101 103  CO2 23  --  23  GLUCOSE 43* 35* 226*  BUN '13 14 12  '$ CREATININE 1.51* 1.50* 1.19  CALCIUM 9.6  --  8.8*  MG 1.9  --   --    Liver Function Tests: Recent Labs  Lab 11/27/21 1045  AST 27  ALT 12  ALKPHOS 53  BILITOT 0.6  PROT 6.6  ALBUMIN 3.2*   CBG: Recent Labs  Lab 11/27/21 1533 11/27/21 1703 11/27/21 2146 11/28/21 0742 11/28/21 1107  GLUCAP 63* 170* 240* 287* 201*    Discharge time spent: greater than 30 minutes.  Signed: Flora Lipps, MD [facility] 11/28/2021

## 2021-11-28 NOTE — Progress Notes (Signed)
Patient leaving AMA, AMA paper signed. Flora Lipps, MD notified.

## 2021-11-28 NOTE — Inpatient Diabetes Management (Addendum)
Inpatient Diabetes Program Recommendations  AACE/ADA: New Consensus Statement on Inpatient Glycemic Control   Target Ranges:  Prepandial:   less than 140 mg/dL      Peak postprandial:   less than 180 mg/dL (1-2 hours)      Critically ill patients:  140 - 180 mg/dL    Latest Reference Range & Units 11/28/21 07:42  Glucose-Capillary 70 - 99 mg/dL 287 (H)    Latest Reference Range & Units 11/27/21 10:46 11/27/21 11:03 11/27/21 11:58 11/27/21 13:14 11/27/21 14:53 11/27/21 15:33 11/27/21 17:03 11/27/21 21:46 11/28/21 07:42  Glucose-Capillary 70 - 99 mg/dL 25 (LL) 143 (H) 59 (L) 99 87 63 (L) 170 (H) 240 (H) 287 (H)    Latest Reference Range & Units 11/27/21 10:45 11/27/21 10:49  Glucose 70 - 99 mg/dL 43 (LL) 35 (LL)    Latest Reference Range & Units 07/17/21 03:11  Hemoglobin A1C 4.8 - 5.6 % 5.7 (H)   Review of Glycemic Control  Diabetes history: DM2 Outpatient Diabetes medications: Jardiance 10 mg daily, Novolog 2-7 units TID with meals Current orders for Inpatient glycemic control: Novolog 0-15 units TID with meals; Solumedrol 40 mg Q12H  Inpatient Diabetes Program Recommendations:    Insulin: Please consider ordering Semglee 10 units Q24H.  NOTE: Patient admitted with hypoglycemia, acute metabolic encephalopathy, pseudoseizure, SIRS, acute hypoxic respiratory failure, hypotension, acute COPDE, acute ST changes, and has history of ETOH abuse. Per H&P on 11/27/21, "Family has recently decreased Lantus to avoid low glucose from Lantus 42 units to 34 units every a.m.  This morning, wife checked patient fingerstick was 180 and gave 10 units of Humalog and 34 units of Lantus and left home.  Son observed patient became unresponsive and had seizure-like activity and probably tongue biting but no loss control of urine or bowel movement.  Family called EMS, EMS arrived and found patient obtunded, tachycardia hypotensive blood pressure 60/40, glucose = 40, multiple rounds of D50 given, and monitoring  showed patient has multiple leads ST elevation and code STEMI called on route to the hospital.  Patient drinks every morning and claimed that he drank 1 shot of vodka this morning." Noted patient was inpatient  08/16/21-08/18/21 with sepsis, a-fib, and hypoglycemia. Per discharge summary on 08/18/21 Lantus was decreased to 20 units daily.  Addendum 11/28/21'@13'$ :37-Came by room to speak with patient about hypoglycemia; however, patient had already left AMA.  Thanks, Barnie Alderman, RN, MSN, [place] [facility] Coordinator Inpatient Diabetes Program [phone] (Team Pager from 8am to 5pm)'

## 2021-11-30 NOTE — Discharge Summary (Deleted)
Physician Discharge Summary   Patient: Thomas Sandoval. MRN: [id number] DOB: 12/27/1955  Admit date:     11/27/2021  Discharge date: 11/28/2021  Discharge Physician: Corrie Mckusick Kylle Lall   PCP: Chevis Pretty, FNP   Recommendations at discharge:   Patient left AGAINST MEDICAL ADVICE  Discharge Diagnoses: Principal Problem:   Acute metabolic encephalopathy Active Problems:   Insulin dependent type 2 diabetes mellitus ([place])   COPD with chronic bronchitis and emphysema ([place])   Essential hypertension   Hypoglycemia   Hypocalcemia   ETOH abuse  Resolved Problems:   * No resolved hospital problems. *   Brief Narrative:  Thomas Sandoval. is a 66 y.o. male with past medical history significant of IDDM, PAF on Xarelto, interstitial lung disease, COPD, HTN, cigar smoker, alcohol abuse, presented to the hospital with fatigue weakness seizure-like activity and altered mental status.  Family noted for sitting blood glucose levels at home and subsequently seizure-like activity.  EMS was called and patient was brought into the hospital obtunded.  He was tachycardic hypertensive with blood pressure of 60/40 and glucose of 40.  Multiple rounds of D50 was given.  EKG showed ST elevation and code STEMI was called in route to the the hospital.  In the ED patient was lethargic tachycardic with hypotension.  Cardiology evaluated the patient and EKG was thought to have no significant ST changes.  He was also given oxygen supplementation.  Chest x-ray without acute findings but chronic interstitial changes.  CT head scan was negative for acute findings. Patient was then considered for admission to the hospital for further evaluation and treatment.  Following conditions were addressed during hospitalization,   Acute metabolic encephalopathy Likely secondary to hypoglycemia.  Latest hemoglobin A1c of 5.2 indicating stringent blood glucose control.  Both long-acting and mealtime coverage has been  decreased.  Would benefit from outpatient endocrinology follow-up.  Patient was given dextrose push initially.  UDS positive for opiates.  Urinalysis without any signs of infection.  Seizure-like activity. Secondary to hypoglycemia.  No indication for antiseizure medication.   SIRS Patient did have some hypoxia and possibility of early aspiration.  WBC at 10.8.  No fever.   Acute hypoxic respiratory failure likely secondary to COPD exacerbation/aspiration. Present on admission.  Concern about aspiration.  CT of the chest showed stable 16 x 13 mm cavitary nodule in the left upper lobe.  Recommended 3 months of follow-up.  Right lower lobe opacity improved from previous.  Stable nodules in the right upper and left upper lobe.  Required 3 L of oxygen by nasal cannula.  Hypotension Resolved after IV fluids.  Blood pressure better.  Continue losartan.  Acute COPD exacerbation Was on DuoNeb, Pulmicort,  steroid.  Encouraged incentive spirometry.  Paroxysmal atrial fibrillation. On short acting metoprolol twice a day at this time, continue Xarelto   EKG with ST changes.  Thought to be secondary to tachycardia.  Resolved at this time.  Troponins negative.  No chest pain.   HTN Denies improved at this time.  Initially received IV fluids.  Patient is on losartan and Lasix at home.  Alcohol abuse Patient was put on CIWA protocol.   Insulin-dependent type 2 diabetes. Of insulin was decreased when he came to the hospital due to hypoglycemia.  Disposition.  Patient decided to leave [place] despite the risk of hypoglycemia hypoxia.  No prescriptions were given and did not wish to stay for it.  Advised to come to the hospital for worsening symptoms.  Consultants: None Procedures performed: None Disposition:  Left AGAINST MEDICAL ADVICE Diet recommendation:   DISCHARGE MEDICATION:  Home medication list as below, Allergies as of 11/28/2021       Reactions   Azithromycin Other  (See Comments), Nausea And Vomiting   "Severe stomach cramps; told to list as an allergy by dr. Years ago"   Avelox [moxifloxacin Hcl In Nacl] Other (See Comments)   Hemolysis  In 2012   Bactrim [sulfamethoxazole-trimethoprim] Diarrhea, Nausea And Vomiting   Moxifloxacin Other (See Comments)   Sulfamethoxazole-trimethoprim Other (See Comments)        Medication List     ASK your doctor about these medications    albuterol 108 (90 Base) MCG/ACT inhaler Commonly known as: VENTOLIN HFA Inhale 2 puffs into the lungs every 6 (six) hours as needed for wheezing or shortness of breath.   B-D INS SYR ULTRAFINE 1CC/30G 30G X 1/2" 1 ML Misc Generic drug: Insulin Syringe-Needle U-100 Use 4 times a day with insulin Dx E11.9   cetirizine 10 MG tablet Commonly known as: ZYRTEC Take 10 mg by mouth daily.   doxycycline 100 MG tablet Commonly known as: VIBRA-TABS Take 1 tablet (100 mg total) by mouth 2 (two) times daily.   empagliflozin 10 MG Tabs tablet Commonly known as: Jardiance Take 1 tablet (10 mg total) by mouth daily before breakfast.   famotidine 20 MG tablet Commonly known as: PEPCID Take 1 tablet (20 mg total) by mouth every morning. Reported on 04/16/2015   furosemide 40 MG tablet Commonly known as: LASIX Take 0.5 tablets (20 mg total) by mouth daily.   HYDROcodone-acetaminophen 5-325 MG tablet Commonly known as: NORCO/VICODIN Take 1 tablet by mouth 2 (two) times daily as needed for severe pain.   insulin glargine 100 UNIT/ML injection Commonly known as: Lantus Inject 0.2 mLs (20 Units total) into the skin daily.   Insulin Syringes (Disposable) U-100 1 ML Misc Use 4 times a day for insulin injection Dx E11.9   ipratropium-albuterol 0.5-2.5 (3) MG/3ML Soln Commonly known as: DUONEB Take 3 mLs by nebulization every 4 (four) hours as needed.   levalbuterol 0.31 MG/3ML nebulizer solution Commonly known as: XOPENEX Take 3 mLs (0.31 mg total) by nebulization every 4  (four) hours as needed for wheezing.   losartan 50 MG tablet Commonly known as: COZAAR Take 1 tablet (50 mg total) by mouth at bedtime. hold this medication until follow up   metoprolol succinate 100 MG 24 hr tablet Commonly known as: TOPROL-XL Take 1 tablet (100 mg total) by mouth daily. Take with or immediately following a meal.   naloxone 4 MG/0.1ML Liqd nasal spray kit Commonly known as: NARCAN Place 1 spray into the nose as needed (For opiate overdose).   NovoLOG 100 UNIT/ML injection Generic drug: insulin aspart PER SLIDING SCALE: 190 - 200 = 2 UNITS. 300 AND ABOVE = 7 UNITS.   OVER THE COUNTER MEDICATION Take 1 Scoop by mouth in the morning and at bedtime. Super Beets   rivaroxaban 20 MG Tabs tablet Commonly known as: Xarelto Take 1 tablet (20 mg total) by mouth daily with supper.        Discharge Exam: Filed Weights   11/27/21 2134  Weight: 83.2 kg   Left AGAINST MEDICAL ADVICE  Condition at discharge:  Left AGAINST MEDICAL ADVICE  The results of significant diagnostics from this hospitalization (including imaging, microbiology, ancillary and laboratory) are listed below for reference.   Imaging Studies: CT CHEST WO CONTRAST  Result Date: 11/27/2021  CLINICAL DATA:  Pneumonia. EXAM: CT CHEST WITHOUT CONTRAST TECHNIQUE: Multidetector CT imaging of the chest was performed following the standard protocol without IV contrast. RADIATION DOSE REDUCTION: This exam was performed according to the departmental dose-optimization program which includes automated exposure control, adjustment of the mA and/or kV according to patient size and/or use of iterative reconstruction technique. COMPARISON:  August 17, 2021. FINDINGS: Cardiovascular: Atherosclerosis of thoracic aorta is noted without aneurysm formation. Normal cardiac size. No pericardial effusion. Coronary artery calcifications are noted. Mediastinum/Nodes: No enlarged mediastinal or axillary lymph nodes. Thyroid gland,  trachea, and esophagus demonstrate no significant findings. Lungs/Pleura: No pneumothorax or pleural effusion is noted. Stable biapical scarring is noted. Right lower lobe airspace opacity noted on prior exam is significantly improved, with residual opacity remaining most consistent with residual inflammation or postinfectious scarring. Minimal left posterior basilar subsegmental atelectasis is noted. 16 x 13 mm cavitating nodule is noted in left upper lobe which is not significantly changed compared to prior exam. Stable 11 x 7 mm irregular density is noted in right upper lobe best seen on image number 86 of series 6. Stable 7 mm nodule is noted in left upper lobe best seen on image number 33 of series 6. Upper Abdomen: No acute abnormality. Musculoskeletal: No chest wall mass or suspicious bone lesions identified. IMPRESSION: Grossly stable 16 x 13 mm cavitary nodule seen in left upper lobe. It is uncertain if this represents cavitary pneumonia or malignancy. Consider one of the following in 3 months for both low-risk and high-risk individuals: (a) repeat chest CT, (b) follow-up PET-CT, or (c) tissue sampling. This recommendation follows the consensus statement: Guidelines for Management of Incidental Pulmonary Nodules Detected on CT Images: From the Fleischner Society 2017; Radiology 2017; 284:228-243. Right lower lobe airspace opacity is noted which is significantly decreased compared to prior exam, and most likely represents residual inflammation or postinfectious scarring. Stable nodules are noted in the right upper and left upper lobes compared to prior exam. Attention to these on follow-up imaging is recommended. Coronary artery calcifications are noted suggesting coronary artery disease. Aortic Atherosclerosis (ICD10-I70.0). Electronically Signed   By: Marijo Conception M.D.   On: 11/27/2021 13:44   CT Head Wo Contrast  Result Date: 11/27/2021 CLINICAL DATA:  Seizure EXAM: CT HEAD WITHOUT CONTRAST  TECHNIQUE: Contiguous axial images were obtained from the base of the skull through the vertex without intravenous contrast. RADIATION DOSE REDUCTION: This exam was performed according to the departmental dose-optimization program which includes automated exposure control, adjustment of the mA and/or kV according to patient size and/or use of iterative reconstruction technique. COMPARISON:  None Available. FINDINGS: Brain: No evidence of acute infarction, hemorrhage, hydrocephalus, extra-axial collection or mass lesion/mass effect. Scattered low-density changes within the periventricular and subcortical white matter compatible with chronic microvascular ischemic change. Mild diffuse cerebral volume loss. Vascular: Atherosclerotic calcifications involving the large vessels of the skull base. No unexpected hyperdense vessel. Skull: Normal. Negative for fracture or focal lesion. Sinuses/Orbits: No acute finding. Other: None. IMPRESSION: 1. No acute intracranial abnormality. 2. Mild chronic microvascular ischemic change and cerebral volume loss. Electronically Signed   By: Davina Poke D.O.   On: 11/27/2021 11:28   DG Chest Portable 1 View  Result Date: 11/27/2021 CLINICAL DATA:  Possible myocardial infarction. EXAM: PORTABLE CHEST 1 VIEW COMPARISON:  Radiographs 09/25/2021 and 08/16/2021.  CT 08/17/2021. FINDINGS: 1105 hours. Stable mild cardiomegaly. Diffuse reticulonodular interstitial prominence throughout both lungs and biapical scarring are unchanged. No superimposed edema, confluent airspace  opacity, pleural effusion or pneumothorax. The bones appear unremarkable.  Telemetry leads overlie the chest. IMPRESSION: Stable radiographic appearance of the chest with chronic lung disease. No acute findings identified. Electronically Signed   By: Richardean Sale M.D.   On: 11/27/2021 11:17    Microbiology: Results for orders placed or performed during the hospital encounter of 11/27/21  Resp Panel by RT-PCR  (Flu A&B, Covid) Anterior Nasal Swab     Status: None   Collection Time: 11/27/21 10:40 AM   Specimen: Anterior Nasal Swab  Result Value Ref Range Status   SARS Coronavirus 2 by RT PCR NEGATIVE NEGATIVE Final    Comment: (NOTE) SARS-CoV-2 target nucleic acids are NOT DETECTED.  The SARS-CoV-2 RNA is generally detectable in upper respiratory specimens during the acute phase of infection. The lowest concentration of SARS-CoV-2 viral copies this assay can detect is 138 copies/mL. A negative result does not preclude SARS-Cov-2 infection and should not be used as the sole basis for treatment or other patient management decisions. A negative result may occur with  improper specimen collection/handling, submission of specimen other than nasopharyngeal swab, presence of viral mutation(s) within the areas targeted by this assay, and inadequate number of viral copies(<138 copies/mL). A negative result must be combined with clinical observations, patient history, and epidemiological information. The expected result is Negative.  Fact Sheet for Patients:  EntrepreneurPulse.com.au  Fact Sheet for Healthcare Providers:  IncredibleEmployment.be  This test is no t yet approved or cleared by the [place] FDA and  has been authorized for detection and/or diagnosis of SARS-CoV-2 by FDA under an Emergency Use Authorization (EUA). This EUA will remain  in effect (meaning this test can be used) for the duration of the COVID-19 declaration under Section 564(b)(1) of the Act, 21 U.S.C.section 360bbb-3(b)(1), unless the authorization is terminated  or revoked sooner.       Influenza A by PCR NEGATIVE NEGATIVE Final   Influenza B by PCR NEGATIVE NEGATIVE Final    Comment: (NOTE) The Xpert Xpress SARS-CoV-2/FLU/RSV plus assay is intended as an aid in the diagnosis of influenza from Nasopharyngeal swab specimens and should not be used as a sole basis for  treatment. Nasal washings and aspirates are unacceptable for Xpert Xpress SARS-CoV-2/FLU/RSV testing.  Fact Sheet for Patients: EntrepreneurPulse.com.au  Fact Sheet for Healthcare Providers: IncredibleEmployment.be  This test is not yet approved or cleared by the [place] FDA and has been authorized for detection and/or diagnosis of SARS-CoV-2 by FDA under an Emergency Use Authorization (EUA). This EUA will remain in effect (meaning this test can be used) for the duration of the COVID-19 declaration under Section 564(b)(1) of the Act, 21 U.S.C. section 360bbb-3(b)(1), unless the authorization is terminated or revoked.  Performed at [facility], [place] [address], [place], [place] [address]     Labs: CBC: Recent Labs  Lab 11/27/21 1045 11/27/21 1049  WBC 10.8*  --   NEUTROABS 7.6  --   HGB 14.4 15.0  HCT 43.7 44.0  MCV 102.6*  --   PLT 212  --    Basic Metabolic Panel: Recent Labs  Lab 11/27/21 1045 11/27/21 1049 11/28/21 0055  NA 140 141 135  K 3.3* 3.3* 4.2  CL 103 101 103  CO2 23  --  23  GLUCOSE 43* 35* 226*  BUN _0 CREATININE 1.51* 1.50* 1.19  CALCIUM 9.6  --  8.8*  MG 1.9  --   --    Liver Function Tests:  Recent Labs  Lab 11/27/21 1045  AST 27  ALT 12  ALKPHOS 53  BILITOT 0.6  PROT 6.6  ALBUMIN 3.2*   CBG: Recent Labs  Lab 11/27/21 1533 11/27/21 1703 11/27/21 2146 11/28/21 0742 11/28/21 1107  GLUCAP 63* 170* 240* 287* 201*    Discharge time spent: less than 30 minutes.  Signed: Flora Lipps, MD [facility] 11/30/2021

## 2021-12-01 ENCOUNTER — Ambulatory Visit
Admission: RE | Admit: 2021-12-01 | Discharge: 2021-12-01 | Disposition: A | Discharge status: Home / Self Care | Payer: BC Managed Care – PPO | Source: Ambulatory Visit | Attending: Emergency Medicine | Admitting: Emergency Medicine

## 2021-12-01 ENCOUNTER — Ambulatory Visit: Payer: BC Managed Care – PPO | Attending: Physical Medicine and Rehabilitation

## 2021-12-01 ENCOUNTER — Other Ambulatory Visit: Payer: Self-pay

## 2021-12-01 ENCOUNTER — Telehealth: Payer: Self-pay | Admitting: Emergency Medicine

## 2021-12-01 DIAGNOSIS — M5459 Other low back pain: Secondary | ICD-10-CM | POA: Insufficient documentation

## 2021-12-01 DIAGNOSIS — G8929 Other chronic pain: Secondary | ICD-10-CM | POA: Diagnosis not present

## 2021-12-01 DIAGNOSIS — R911 Solitary pulmonary nodule: Secondary | ICD-10-CM

## 2021-12-01 DIAGNOSIS — M545 Low back pain, unspecified: Secondary | ICD-10-CM | POA: Diagnosis not present

## 2021-12-01 NOTE — Telephone Encounter (Signed)
Patient completed CT scan today- would like to know next steps. Attempted to schedule in person or mychart visit with nurse practitioner to go over results as Dr. Lamonte Sakai does not have any openings soon. Patient would like to wait to be called with results/ next steps.  Please call back at [phone].

## 2021-12-01 NOTE — Telephone Encounter (Signed)
Called patient and he picked up cell phone. Discussed staying on current hydrocodone script until follow up and then switching to Tramadol, which he is agreeable to. Pt states he is taking medication consistently and did go for initial PT evaluation; is skeptical this will help but is willing to try, especially in light of poorer activity tolerance over the past year.   Also discussed recent hospital evaluation for hypoglycemia and advised him to contact his PCP and adjust his insulin if consistent blood glucose readings <60. Patient states he has been in very good control of his diabetes for a long time and, despite reviewing more liberal controls in his age group and dangers of recurrent low blood sugars, states he will continue his current management.   Patient expressed thanks for the check in.   Gertie Gowda, DO 12/01/2021

## 2021-12-01 NOTE — Therapy (Signed)
OUTPATIENT PHYSICAL THERAPY THORACOLUMBAR EVALUATION   Patient Name: Thomas Sandoval. MRN: [id number] DOB:[birth year], 66 y.o., male Today's Date: 12/01/2021   PT End of Session - 12/01/21 1034     Visit Number 1    Number of Visits 6    Date for PT Re-Evaluation 12/26/21    PT Start Time 1031    PT Stop Time 1108    PT Time Calculation (min) 37 min    Activity Tolerance Patient tolerated treatment well    Behavior During Therapy WFL for tasks assessed/performed             Past Medical History:  Diagnosis Date   Arthritis    Bladder cancer ([place])    Chronic back pain    Chronic combined systolic and diastolic CHF (congestive heart failure) ([place]) 07/16/2021   COPD (chronic obstructive pulmonary disease) ([place])    DDD (degenerative disc disease)    Diabetic retinopathy of both eyes ([place])    Essential hypertension    GERD (gastroesophageal reflux disease)    History of atrial flutter 02/2011   Converted to NSR with Cardizem   History of chronic bronchitis    History of hemolytic anemia 02/2011   secondary to Avelox   HOH (hard of hearing)    HOH (hard of hearing)    no eardrum and nerve damage on R, also HOH on L   Mitral valve prolapse    a. 2D Echo 11/27/14: EF 55-60%; images were inadequate for LV wall motion assessment, + mild late systolic mitral valve prolapse involving the anterior leaflet.   PAD (peripheral artery disease) ([place]) 04/2014   Dr Trula Slade; bilateral SFA occlusion, R mid, L distal   PAF (paroxysmal atrial fibrillation) ([place])    a. Dx 11/2014 during admission for perf ulcer.   Perforated ulcer ([place])    a. 11/2014 s/p surgery.   Productive cough    Smokers' cough ([place])    Type 1 diabetes mellitus ([place]) 1977   Past Surgical History:  Procedure Laterality Date   CARDIOVERSION N/A 01/20/2021   Procedure: CARDIOVERSION;  Surgeon: Fay Records, MD;  Location: [facility];  Service: Cardiovascular;  Laterality: N/A;   CYSTOSCOPY WITH URETEROSCOPY  Right 08/14/2013   Procedure: CYSTOSCOPY WITH URETEROSCOPY BLADDER BIOPSY ;  Surgeon: Claybon Jabs, MD;  Location: [facility];  Service: Urology;  Laterality: Right;   ESOPHAGOGASTRODUODENOSCOPY N/A 02/06/2013   Procedure: ESOPHAGOGASTRODUODENOSCOPY (EGD);  Surgeon: Lafayette Dragon, MD;  [facility] END[facility]COPY;  Service: Endoscopy;  Laterality: N/A;   LAPAROSCOPY N/A 11/25/2014   Procedure: LAPAROSCOPIC PRIMARY REPAIR OF PERFORATED PREPYLORIC ULCER WITH Silvestre Gunner;  Surgeon: Greer Pickerel, MD;  Location: [place];  Service: General;  Laterality: N/A;   TONSILLECTOMY  as child   TRANSTHORACIC ECHOCARDIOGRAM  02-17-2011   MODERATE LVH/  EF 65%   TRANSURETHRAL RESECTION OF BLADDER TUMOR WITH GYRUS (TURBT-GYRUS) N/A 06/12/2013   Procedure: TRANSURETHRAL RESECTION OF BLADDER TUMOR WITH GYRUS (TURBT-GYRUS);  Surgeon: Claybon Jabs, MD;  Location[facility];  Service: Urology;  Laterality: N/A;   TYMPANIC MEMBRANE REPAIR  as child   Patient Active Problem List   Diagnosis Date Noted   Acute metabolic encephalopathy 89/16/9450   ETOH abuse 11/28/2021   Hypocalcemia 11/27/2021   Nodule of left lung 08/18/2021   Demand ischemia ([place]) 08/17/2021   Sepsis ([place]) 08/16/2021   Hypoglycemia    Persistent atrial fibrillation/flutter with rapid ventricular response ([place]) 07/16/2021   AKI (acute kidney  injury) ([place]) 07/16/2021   Chronic combined systolic and diastolic CHF (congestive heart failure) ([place]) 07/16/2021   Hyponatremia 07/16/2021   Secondary hypercoagulable state ([place]) 12/25/2020   Alcohol-induced chronic pancreatitis ([place]) 12/17/2020   Delirium tremens ([place]) 12/17/2020   COPD with chronic bronchitis and emphysema ([place])    Hearing loss 07/26/2019   Chronic back pain 05/02/2019   Mild renal insufficiency 08/25/2017   Prepyloric ulcer 12/12/2014   Essential hypertension 12/07/2014   Mitral valve prolapse 12/06/2014   Atrial fibrillation with RVR ([place]) 11/26/2014    GERD (gastroesophageal reflux disease) 02/06/2013   Insulin dependent type 2 diabetes mellitus ([place]) 02/04/2013   Uncontrolled type 1 diabetes mellitus with hypoglycemia, with long-term current use of insulin ([place]) 02/16/2011    PCP: Chevis Pretty, FNP  REFERRING PROVIDER: Gertie Gowda, DO  REFERRING DIAG: Chronic bilateral low back pain without sciatica  Rationale for Evaluation and Treatment Rehabilitation  THERAPY DIAG:  Other low back pain  ONSET DATE: 15 years ago  SUBJECTIVE:                                                                                                                                                                                           SUBJECTIVE STATEMENT: Patient reports that his back has been hurting for about 15 years, but it has been getting worse in the past year. He notes that his pain is now constant across his low back. He notes that when his pain gets really bad, but he can typically get his pain to go down by laying down for about 5 minutes.  PERTINENT HISTORY:  Patient is hard of hearing, a-fib, HTN, COPD, DM, CHF, PAD, OA, history of cancer, and DDD  PAIN:  Are you having pain? Yes: NPRS scale: 10/10 Pain location: low back Pain description: "it just hurts"  Aggravating factors: bending over, riding his tractor Relieving factors: laying supine   PRECAUTIONS: None  WEIGHT BEARING RESTRICTIONS No  FALLS:  Has patient fallen in last 6 months? No  LIVING ENVIRONMENT: Lives with: lives with their family Lives in: House/apartment Stairs: Yes: Internal: 1 steps; none and External: 4-5 steps; on right going up  OCCUPATION: farmer; supervises workers   PLOF: [place] go to pain management (requires PT prior pain management)    OBJECTIVE:  SCREENING FOR RED FLAGS: Bowel or bladder incontinence: No Spinal tumors: No Cauda equina syndrome: No Compression fracture: No Abdominal aneurysm:  No  COGNITION:  Overall cognitive status: Within functional limits for tasks assessed     SENSATION: Patient reports no numbness or tingling  POSTURE: rounded shoulders, forward head, decreased lumbar  lordosis, and flexed trunk   PALPATION: TTP: bilateral lumbar paraspinals and PSIS  LUMBAR ROM:   Active  A/PROM  eval  Flexion 20; prior to knee flexion  Extension 10  Right lateral flexion 75% limited; limited by pain  Left lateral flexion 75% limited; limited by pain  Right rotation 50% limited; limited by pain  Left rotation 50% limited: limited by pain   (Blank rows = not tested)  LOWER EXTREMITY ROM: WFL for activities assessed    LOWER EXTREMITY MMT:    MMT Right eval Left eval  Hip flexion 4-/5 4-/5  Hip extension    Hip abduction    Hip adduction    Hip internal rotation    Hip external rotation    Knee flexion 4-/5 4-/5  Knee extension 5/5 5/5  Ankle dorsiflexion    Ankle plantarflexion    Ankle inversion    Ankle eversion     (Blank rows = not tested)  FUNCTIONAL TESTS:  Requires UE support for sit to stand and stand to sit transfer  GAIT: Assistive device utilized: None Level of assistance: Complete Independence Comments: ambulates with flexed trunk, decreased gait speed, stride length, heel strike and toe off  TODAY'S TREATMENT   PATIENT EDUCATION:  Education details: POC, healing, pain management, prognosis Person educated: Patient Education method: Explanation Education comprehension: verbalized understanding   HOME EXERCISE PROGRAM:   ASSESSMENT:  CLINICAL IMPRESSION: Patient is a 66 y.o. male who was seen today for physical therapy evaluation and treatment for chronic low back pain. He presented with moderate to high pain severity and irritability with lumbar AROM and palpation to his lumbar paraspinals being the most aggravating to his familiar pain. His prognosis is limited by the chronicity of his low back and his desire for a  referral to pain management. Recommend that he continue with skilled physical therapy to address his remaining impairments to maximize his functional mobility.    OBJECTIVE IMPAIRMENTS Abnormal gait, decreased activity tolerance, decreased mobility, difficulty walking, decreased ROM, decreased strength, hypomobility, impaired flexibility, impaired tone, postural dysfunction, and pain.   ACTIVITY LIMITATIONS carrying, lifting, bending, standing, squatting, stairs, transfers, and locomotion level  PARTICIPATION LIMITATIONS: shopping, community activity, occupation, and yard work  PERSONAL FACTORS Behavior pattern, Profession, Time since onset of injury/illness/exacerbation, and 3+ comorbidities: Patient is hard of hearing, a-fib, HTN, COPD, DM, CHF, PAD, OA, history of cancer, and DDD  are also affecting patient's functional outcome.   REHAB POTENTIAL: Fair    CLINICAL DECISION MAKING: Evolving/moderate complexity  EVALUATION COMPLEXITY: Moderate   GOALS: Goals reviewed with patient? No  LONG TERM GOALS: Target date: 12/22/2021  Patient will be independent with his HEP.  Baseline:  Goal status: INITIAL  2.  Patient will be able to complete his daily activities without his familiar low back pain exceeding 7/10.  Baseline:  Goal status: INITIAL  3.  Patient will be able to pick up at least 5 pounds from the floor without being limited by his familiar back pain.  Baseline:  Goal status: INITIAL  PLAN: PT FREQUENCY: 2x/week  PT DURATION: 3 weeks  PLANNED INTERVENTIONS: Therapeutic exercises, Therapeutic activity, Neuromuscular re-education, Balance training, Gait training, Patient/Family education, Self Care, Joint mobilization, Stair training, Electrical stimulation, Spinal mobilization, Cryotherapy, Moist heat, Traction, Ultrasound, Manual therapy, and Re-evaluation.  PLAN FOR NEXT SESSION: nustep, lumbar stabilization, and modalities for pain management    Darlin Coco,  PT 12/01/2021, 12:17 PM

## 2021-12-02 NOTE — Telephone Encounter (Signed)
Pt requesting results of Chest CT done 12/01/21. Please advise.

## 2021-12-03 NOTE — Telephone Encounter (Signed)
Please let the patient know that I have reviewd his CT chest. The LUL nodule that we are following is still present, has not changed significantly compared with June. I would like to see him in office to discuss next steps in follow up. This will at least include additional imaging to track any changes in the nodule.   Please touch base w Estill Bamberg to get him worked into one of my nodule or blocked slots. Thanks.

## 2021-12-04 ENCOUNTER — Ambulatory Visit: Payer: BC Managed Care – PPO | Admitting: *Deleted

## 2021-12-04 ENCOUNTER — Encounter: Payer: Self-pay | Admitting: *Deleted

## 2021-12-04 DIAGNOSIS — M545 Low back pain, unspecified: Secondary | ICD-10-CM | POA: Diagnosis not present

## 2021-12-04 DIAGNOSIS — G8929 Other chronic pain: Secondary | ICD-10-CM | POA: Diagnosis not present

## 2021-12-04 DIAGNOSIS — M5459 Other low back pain: Secondary | ICD-10-CM

## 2021-12-04 NOTE — Therapy (Signed)
OUTPATIENT PHYSICAL THERAPY THORACOLUMBAR EVALUATION   Patient Name: Thomas Sandoval. MRN: [id number] DOB:February 09, 1956, 66 y.o., male Today's Date: 12/04/2021   PT End of Session - 12/04/21 1516     Visit Number 2    Number of Visits 6    Date for PT Re-Evaluation 12/26/21    PT Start Time 2409    PT Stop Time 1550    PT Time Calculation (min) 35 min             Past Medical History:  Diagnosis Date   Arthritis    Bladder cancer ([place])    Chronic back pain    Chronic combined systolic and diastolic CHF (congestive heart failure) ([place]) 07/16/2021   COPD (chronic obstructive pulmonary disease) ([facility])    DDD (degenerative disc disease)    Diabetic retinopathy of both eyes ([facility])    Essential hypertension    GERD (gastroesophageal reflux disease)    History of atrial flutter 02/2011   Converted to NSR with Cardizem   History of chronic bronchitis    History of hemolytic anemia 02/2011   secondary to Avelox   HOH (hard of hearing)    HOH (hard of hearing)    no eardrum and nerve damage on R, also HOH on L   Mitral valve prolapse    a. 2D Echo 11/27/14: EF 55-60%; images were inadequate for LV wall motion assessment, + mild late systolic mitral valve prolapse involving the anterior leaflet.   PAD (peripheral artery disease) ([place]) 04/2014   Dr Trula Slade; bilateral SFA occlusion, R mid, L distal   PAF (paroxysmal atrial fibrillation) ([place])    a. Dx 11/2014 during admission for perf ulcer.   Perforated ulcer ([place])    a. 11/2014 s/p surgery.   Productive cough    Smokers' cough ([place])    Type 1 diabetes mellitus ([place]) 1977   Past Surgical History:  Procedure Laterality Date   CARDIOVERSION N/A 01/20/2021   Procedure: CARDIOVERSION;  Surgeon: Fay Records, MD;  Location: [facility];  Service: Cardiovascular;  Laterality: N/A;   CYSTOSCOPY WITH URETEROSCOPY Right 08/14/2013   Procedure: CYSTOSCOPY WITH URETEROSCOPY BLADDER BIOPSY ;  Surgeon: Claybon Jabs, MD;  [facility];  Service: Urology;  Laterality: Right;   ESOPHAGOGASTRODUODENOSCOPY N/A 02/06/2013   Procedure: ESOPHAGOGASTRODUODENOSCOPY (EGD);  Surgeon: Lafayette Dragon, MD;  Location: [facility] ENDOSCOPY;  Service: Endoscopy;  Laterality: N/A;   LAPAROSCOPY N/A 11/25/2014   Procedure: LAPAROSCOPIC PRIMARY REPAIR OF PERFORATED PREPYLORIC ULCER WITH Silvestre Gunner;  Surgeon: Greer Pickerel, MD;  Location: [place];  Service: General;  Laterality: N/A;   TONSILLECTOMY  as child   TRANSTHORACIC ECHOCARDIOGRAM  02-17-2011   MODERATE LVH/  EF 65%   TRANSURETHRAL RESECTION OF BLADDER TUMOR WITH GYRUS (TURBT-GYRUS) N/A 06/12/2013   Procedure: TRANSURETHRAL RESECTION OF BLADDER TUMOR WITH GYRUS (TURBT-GYRUS);  Surgeon: Claybon Jabs, MD;  Location[facility];  Service: Urology;  Laterality: N/A;   TYMPANIC MEMBRANE REPAIR  as child   Patient Active Problem List   Diagnosis Date Noted   Acute metabolic encephalopathy 73/53/2992   ETOH abuse 11/28/2021   Hypocalcemia 11/27/2021   Nodule of left lung 08/18/2021   Demand ischemia ([place]) 08/17/2021   Sepsis ([place]) 08/16/2021   Hypoglycemia    Persistent atrial fibrillation/flutter with rapid ventricular response ([place]) 07/16/2021   AKI (acute kidney injury) ([place]) 07/16/2021   Chronic combined systolic and diastolic CHF (congestive heart failure) ([place]) 07/16/2021   Hyponatremia  07/16/2021   Secondary hypercoagulable state ([place]) 12/25/2020   Alcohol-induced chronic pancreatitis ([place]) 12/17/2020   Delirium tremens ([place]) 12/17/2020   COPD with chronic bronchitis and emphysema ([place])    Hearing loss 07/26/2019   Chronic back pain 05/02/2019   Mild renal insufficiency 08/25/2017   Prepyloric ulcer 12/12/2014   Essential hypertension 12/07/2014   Mitral valve prolapse 12/06/2014   Atrial fibrillation with RVR ([place]) 11/26/2014   GERD (gastroesophageal reflux disease) 02/06/2013   Insulin dependent type 2 diabetes mellitus ([place]) 02/04/2013    Uncontrolled type 1 diabetes mellitus with hypoglycemia, with long-term current use of insulin ([place]) 02/16/2011    PCP: Chevis Pretty, FNP  REFERRING PROVIDER: Gertie Gowda, DO  REFERRING DIAG: Chronic bilateral low back pain without sciatica  Rationale for Evaluation and Treatment Rehabilitation  THERAPY DIAG:  Other low back pain  ONSET DATE: 15 years ago  SUBJECTIVE:                                                                                                                                                                                           SUBJECTIVE STATEMENT: Patient reports that his back has been hurting for about 15 years, but it has been getting worse in the past year. Pain decreases only when lying down  PERTINENT HISTORY:  Patient is hard of hearing, a-fib, HTN, COPD, DM, CHF, PAD, OA, history of cancer, and DDD  PAIN:  Are you having pain? Yes: NPRS scale: 9-10/10 Pain location: low back Pain description: "it just hurts"  Aggravating factors: bending over, riding his tractor Relieving factors: laying supine   PRECAUTIONS: None  WEIGHT BEARING RESTRICTIONS No  FALLS:  Has patient fallen in last 6 months? No  LIVING ENVIRONMENT: Lives with: lives with their family Lives in: House/apartment Stairs: Yes: Internal: 1 steps; none and External: 4-5 steps; on right going up  OCCUPATION: farmer; supervises workers   PLOF: Independent  PATIENT GOALS go to pain management (requires PT prior pain management)    OBJECTIVE:  TODAY'S TREATMENT    12-04-21  Discussed postures and movement pattern changes ( such as hinge bending at the hips) to decrease pain triggers and decrease stress on his LB. Practiced log roll with AB bracing with verbal and tactile cues needed for technique  US/ combo x9 mins '@1'$ .5 w/cm2 to Bil LB paras in RT sidelying (unable to perform longer due to pain) Manual: STW to Bil LB paraswith Pt in RT sidelying  PATIENT  EDUCATION:  Education details: POC, healing, pain management, prognosis Person educated: Patient Education method: Explanation Education comprehension: verbalized understanding   HOME EXERCISE PROGRAM:  ASSESSMENT:  CLINICAL IMPRESSION: Pt arrived not doing well due to high pain levels LB. Conservative Rx performed as well as discussion and demonstrations of movement pattern changes to decrease stress on LB. AB bracing also taught to perform during transitional movements as well as hip hinge bending. Pt's LBP was about the same after Rx with no decrease in pain as per Pt.   OBJECTIVE IMPAIRMENTS Abnormal gait, decreased activity tolerance, decreased mobility, difficulty walking, decreased ROM, decreased strength, hypomobility, impaired flexibility, impaired tone, postural dysfunction, and pain.   ACTIVITY LIMITATIONS carrying, lifting, bending, standing, squatting, stairs, transfers, and locomotion level  PARTICIPATION LIMITATIONS: shopping, community activity, occupation, and yard work  PERSONAL FACTORS Behavior pattern, Profession, Time since onset of injury/illness/exacerbation, and 3+ comorbidities: Patient is hard of hearing, a-fib, HTN, COPD, DM, CHF, PAD, OA, history of cancer, and DDD  are also affecting patient's functional outcome.   REHAB POTENTIAL: Fair    CLINICAL DECISION MAKING: Evolving/moderate complexity  EVALUATION COMPLEXITY: Moderate   GOALS: Goals reviewed with patient? No  LONG TERM GOALS: Target date: 12/22/2021  Patient will be independent with his HEP.  Baseline:  Goal status: INITIAL  2.  Patient will be able to complete his daily activities without his familiar low back pain exceeding 7/10.  Baseline:  Goal status: INITIAL  3.  Patient will be able to pick up at least 5 pounds from the floor without being limited by his familiar back pain.  Baseline:  Goal status: INITIAL  PLAN: PT FREQUENCY: 2x/week  PT DURATION: 3 weeks  PLANNED  INTERVENTIONS: Therapeutic exercises, Therapeutic activity, Neuromuscular re-education, Balance training, Gait training, Patient/Family education, Self Care, Joint mobilization, Stair training, Electrical stimulation, Spinal mobilization, Cryotherapy, Moist heat, Traction, Ultrasound, Manual therapy, and Re-evaluation.  PLAN FOR NEXT SESSION: nustep, lumbar stabilization, and modalities for pain management    Aleya Durnell,CHRIS, PTA 12/04/2021, 5:26 PM

## 2021-12-05 ENCOUNTER — Other Ambulatory Visit: Payer: Self-pay | Admitting: Family Medicine

## 2021-12-05 DIAGNOSIS — R946 Abnormal results of thyroid function studies: Secondary | ICD-10-CM

## 2021-12-05 DIAGNOSIS — J189 Pneumonia, unspecified organism: Secondary | ICD-10-CM

## 2021-12-05 DIAGNOSIS — J449 Chronic obstructive pulmonary disease, unspecified: Secondary | ICD-10-CM

## 2021-12-05 NOTE — Telephone Encounter (Signed)
Spoke with pt and reviewed CT results as dictated by Dr. Lamonte Sakai. Scheduled pt for OV with Dr. Lamonte Sakai on 12/17/21. Nothing further needed at this time.

## 2021-12-09 ENCOUNTER — Encounter: Payer: Self-pay | Admitting: *Deleted

## 2021-12-09 ENCOUNTER — Ambulatory Visit: Payer: BC Managed Care – PPO | Attending: Physical Medicine and Rehabilitation | Admitting: *Deleted

## 2021-12-09 DIAGNOSIS — M5459 Other low back pain: Secondary | ICD-10-CM | POA: Diagnosis not present

## 2021-12-09 NOTE — Therapy (Signed)
OUTPATIENT PHYSICAL THERAPY THORACOLUMBAR TREATMENT   Patient Name: Thomas Sandoval. MRN: [id number] DOB:[birth year], 66 y.o., male Today's Date: 12/09/2021   PT End of Session - 12/09/21 1749     Visit Number 3    Number of Visits 6    Date for PT Re-Evaluation 12/26/21    PT Start Time 1027    PT Stop Time 1606    PT Time Calculation (min) 36 min              Past Medical History:  Diagnosis Date   Arthritis    Bladder cancer ([place])    Chronic back pain    Chronic combined systolic and diastolic CHF (congestive heart failure) ([place]) 07/16/2021   COPD (chronic obstructive pulmonary disease) ([place])    DDD (degenerative disc disease)    Diabetic retinopathy of both eyes ([place])    Essential hypertension    GERD (gastroesophageal reflux disease)    History of atrial flutter 02/2011   Converted to NSR with Cardizem   History of chronic bronchitis    History of hemolytic anemia 02/2011   secondary to Avelox   HOH (hard of hearing)    HOH (hard of hearing)    no eardrum and nerve damage on R, also HOH on L   Mitral valve prolapse    a. 2D Echo 11/27/14: EF 55-60%; images were inadequate for LV wall motion assessment, + mild late systolic mitral valve prolapse involving the anterior leaflet.   PAD (peripheral artery disease) ([place]) 04/2014   Dr Trula Slade; bilateral SFA occlusion, R mid, L distal   PAF (paroxysmal atrial fibrillation) ([place])    a. Dx 11/2014 during admission for perf ulcer.   Perforated ulcer ([place])    a. 11/2014 s/p surgery.   Productive cough    Smokers' cough ([place])    Type 1 diabetes mellitus ([place]) 1977   Past Surgical History:  Procedure Laterality Date   CARDIOVERSION N/A 01/20/2021   Procedure: CARDIOVERSION;  Surgeon: Fay Records, MD;  Location: [facility];  Service: Cardiovascular;  Laterality: N/A;   CYSTOSCOPY WITH URETEROSCOPY Right 08/14/2013   Procedure: CYSTOSCOPY WITH URETEROSCOPY BLADDER BIOPSY ;  Surgeon: Claybon Jabs, MD;  Location:  [facility];  Service: Urology;  Laterality: Right;   ESOPHAGOGASTRODUODENOSCOPY N/A 02/06/2013   Procedure: ESOPHAGOGASTRODUODENOSCOPY (EGD);  Surgeon: Lafayette Dragon, MD;  Location: [facility] ENDOSCOPY;  Service: Endoscopy;  Laterality: N/A;   LAPAROSCOPY N/A 11/25/2014   Procedure: LAPAROSCOPIC PRIMARY REPAIR OF PERFORATED PREPYLORIC ULCER WITH Silvestre Gunner;  Surgeon: Greer Pickerel, MD;  Location: [facility] [place];  Service: General;  Laterality: N/A;   TONSILLECTOMY  as child   TRANSTHORACIC ECHOCARDIOGRAM  02-17-2011   MODERATE LVH/  EF 65%   TRANSURETHRAL RESECTION OF BLADDER TUMOR WITH GYRUS (TURBT-GYRUS) N/A 06/12/2013   Procedure: TRANSURETHRAL RESECTION OF BLADDER TUMOR WITH GYRUS (TURBT-GYRUS);  Surgeon: Claybon Jabs, MD;  Location: [facility];  Service: Urology;  Laterality: N/A;   TYMPANIC MEMBRANE REPAIR  as child   Patient Active Problem List   Diagnosis Date Noted   Acute metabolic encephalopathy 25/36/6440   ETOH abuse 11/28/2021   Hypocalcemia 11/27/2021   Nodule of left lung 08/18/2021   Demand ischemia 08/17/2021   Sepsis ([place]) 08/16/2021   Hypoglycemia    Persistent atrial fibrillation/flutter with rapid ventricular response ([place]) 07/16/2021   AKI (acute kidney injury) ([place]) 07/16/2021   Chronic combined systolic and diastolic CHF (congestive heart failure) ([place]) 07/16/2021   Hyponatremia  07/16/2021   Secondary hypercoagulable state ([place]) 12/25/2020   Alcohol-induced chronic pancreatitis ([place]) 12/17/2020   Delirium tremens ([place]) 12/17/2020   COPD with chronic bronchitis and emphysema ([place])    Hearing loss 07/26/2019   Chronic back pain 05/02/2019   Mild renal insufficiency 08/25/2017   Prepyloric ulcer 12/12/2014   Essential hypertension 12/07/2014   Mitral valve prolapse 12/06/2014   Atrial fibrillation with RVR ([place]) 11/26/2014   GERD (gastroesophageal reflux disease) 02/06/2013   Insulin dependent type 2 diabetes mellitus ([place]) 02/04/2013    Uncontrolled type 1 diabetes mellitus with hypoglycemia, with long-term current use of insulin ([place]) 02/16/2011    PCP: Chevis Pretty, FNP  REFERRING PROVIDER: Gertie Gowda, DO  REFERRING DIAG: Chronic bilateral low back pain without sciatica  Rationale for Evaluation and Treatment Rehabilitation  THERAPY DIAG:  Other low back pain  ONSET DATE: 15 years ago  SUBJECTIVE:                                                                                                                                                                                           SUBJECTIVE STATEMENT: Doing about the same. Rough day today. PERTINENT HISTORY:  Patient is hard of hearing, a-fib, HTN, COPD, DM, CHF, PAD, OA, history of cancer, and DDD  PAIN:  Are you having pain? Yes: NPRS scale: 9-10/10 Pain location: low back Pain description: "it just hurts"  Aggravating factors: bending over, riding his tractor Relieving factors: laying supine   PRECAUTIONS: None  WEIGHT BEARING RESTRICTIONS No  FALLS:  Has patient fallen in last 6 months? No  LIVING ENVIRONMENT: Lives with: lives with their family Lives in: House/apartment Stairs: Yes: Internal: 1 steps; none and External: 4-5 steps; on right going up  OCCUPATION: farmer; supervises workers   PLOF: [place] go to pain management (requires PT prior pain management)    OBJECTIVE:  TODAY'S TREATMENT    12-09-21  Reviewed postures and movement patterns for ADL's to help decrease pain. He was able to try nustep today  for 6 minutes, but had increased pain and had to quit. IFC and HMP to LB x 15 mins in sitting.   PATIENT EDUCATION:  Education details: POC, healing, pain management, prognosis Person educated: Patient Education method: Explanation Education comprehension: verbalized understanding   HOME EXERCISE PROGRAM:   ASSESSMENT:  CLINICAL IMPRESSION: Pt arrived today 15 mins late due to not doing  well secondary to pain. He was able to try nustep and Body mechanics and movement patterns were reviewed to help decrease stress to LB.  IFC and HMP to LB in sitting was tolerated better than  [place] and STW.   OBJECTIVE IMPAIRMENTS Abnormal gait, decreased activity tolerance, decreased mobility, difficulty walking, decreased ROM, decreased strength, hypomobility, impaired flexibility, impaired tone, postural dysfunction, and pain.   ACTIVITY LIMITATIONS carrying, lifting, bending, standing, squatting, stairs, transfers, and locomotion level  PARTICIPATION LIMITATIONS: shopping, community activity, occupation, and yard work  PERSONAL FACTORS Behavior pattern, Profession, Time since onset of injury/illness/exacerbation, and 3+ comorbidities: Patient is hard of hearing, a-fib, HTN, COPD, DM, CHF, PAD, OA, history of cancer, and DDD  are also affecting patient's functional outcome.   REHAB POTENTIAL: Fair    CLINICAL DECISION MAKING: Evolving/moderate complexity  EVALUATION COMPLEXITY: Moderate   GOALS: Goals reviewed with patient? No  LONG TERM GOALS: Target date: 12/22/2021  Patient will be independent with his HEP.  Baseline:  Goal status: INITIAL  2.  Patient will be able to complete his daily activities without his familiar low back pain exceeding 7/10.  Baseline:  Goal status: INITIAL  3.  Patient will be able to pick up at least 5 pounds from the floor without being limited by his familiar back pain.  Baseline:  Goal status: INITIAL  PLAN: PT FREQUENCY: 2x/week  PT DURATION: 3 weeks  PLANNED INTERVENTIONS: Therapeutic exercises, Therapeutic activity, Neuromuscular re-education, Balance training, Gait training, Patient/Family education, Self Care, Joint mobilization, Stair training, Electrical stimulation, Spinal mobilization, Cryotherapy, Moist heat, Traction, Ultrasound, Manual therapy, and Re-evaluation.  PLAN FOR NEXT SESSION: nustep, lumbar stabilization, and modalities  for pain management    Kassondra Geil,CHRIS, PTA 12/09/2021, 5:56 PM

## 2021-12-11 ENCOUNTER — Ambulatory Visit: Payer: Self-pay

## 2021-12-11 ENCOUNTER — Ambulatory Visit (INDEPENDENT_AMBULATORY_CARE_PROVIDER_SITE_OTHER): Payer: BC Managed Care – PPO | Admitting: Orthopaedic Surgery

## 2021-12-11 ENCOUNTER — Encounter: Payer: Self-pay | Admitting: *Deleted

## 2021-12-11 ENCOUNTER — Encounter: Payer: Self-pay | Admitting: Orthopaedic Surgery

## 2021-12-11 ENCOUNTER — Ambulatory Visit: Payer: BC Managed Care – PPO | Admitting: *Deleted

## 2021-12-11 VITALS — Ht 69.0 in | Wt 183.0 lb

## 2021-12-11 DIAGNOSIS — M51369 Other intervertebral disc degeneration, lumbar region without mention of lumbar back pain or lower extremity pain: Secondary | ICD-10-CM

## 2021-12-11 DIAGNOSIS — M5136 Other intervertebral disc degeneration, lumbar region: Secondary | ICD-10-CM | POA: Diagnosis not present

## 2021-12-11 DIAGNOSIS — G8929 Other chronic pain: Secondary | ICD-10-CM | POA: Diagnosis not present

## 2021-12-11 DIAGNOSIS — M5459 Other low back pain: Secondary | ICD-10-CM | POA: Diagnosis not present

## 2021-12-11 DIAGNOSIS — M545 Low back pain, unspecified: Secondary | ICD-10-CM | POA: Diagnosis not present

## 2021-12-11 NOTE — Progress Notes (Signed)
Office Visit Note   Patient: Thomas Sandoval.           Date of Birth: March 27, 1955           MRN: [id number] Visit Date: 12/11/2021              Requested by: Chevis Pretty, [address],  [place] [id number] PCP: Chevis Pretty, FNP   Assessment & Plan: Visit Diagnoses:  1. Chronic bilateral low back pain, unspecified whether sciatica present   2. Other intervertebral disc degeneration, lumbar region     Plan: Multiple medical problems with foraminal stenosis on the right.  He does have some hip osteoarthritis on the right.  We discussed that if he is able quit smoking to return we could proceed with diagnostic work-up of his back.  He would not be a candidate for decompression fusion unless he quit smoking.  Follow-Up Instructions: No follow-ups on file.   Orders:  Orders Placed This Encounter  Procedures   XR Lumbar Spine 2-3 Views   No orders of the defined types were placed in this encounter.     Procedures: No procedures performed   Clinical Data: No additional findings.   Subjective: Chief Complaint  Patient presents with   Lower Back - Pain    HPI 66 year old male chronic smoker with multilevel lumbar disc degeneration with lumbar curvature L2-S1.  He is got history of atrial fibs, heart failure, PAD, COPD, alcohol induced pancreatitis.  Chronic Norco 5/325 2 tablets daily greater than a year.  He states he used to be on a lot more opioids and it seemed to work better.  Has diabetes but A1c is 5.6 good control.  Pain worse with standing and walking.  Additionally he is hard of hearing without hearing aid.  Review of Systems no myelopathic symptoms.   Objective: Vital Signs: Ht '5\' 9"'$  (1.753 m)   Wt 183 lb (83 kg)   BMI 27.02 kg/m   Physical Exam Nursing note reviewed.  Constitutional:      Appearance: He is well-developed.  HENT:     Head: Normocephalic and atraumatic.     Right Ear: External ear normal.     Left  Ear: External ear normal.  Eyes:     Pupils: Pupils are equal, round, and reactive to light.  Neck:     Thyroid: No thyromegaly.     Trachea: No tracheal deviation.  Cardiovascular:     Rate and Rhythm: Normal rate.  Pulmonary:     Effort: Pulmonary effort is normal.     Breath sounds: No wheezing.  Abdominal:     General: Bowel sounds are normal.     Palpations: Abdomen is soft.  Musculoskeletal:     Cervical back: Neck supple.  Skin:    General: Skin is warm and dry.     Capillary Refill: Capillary refill takes less than 2 seconds.  Neurological:     Mental Status: He is alert and oriented to person, place, and time.  Psychiatric:        Behavior: Behavior normal.        Thought Content: Thought content normal.        Judgment: Judgment normal.     Ortho Exam mild right lumbar curvature pelvis mild obliquity 1 cm.  0 degrees internal rotation right hip with some right groin pain.  Left hip has 20 degrees internal rotation without pain.  No hip flexion contracture on the right knee reaches  full extension he walks with a slight hip flexed gait.  No lower extremity atrophy.  Specialty Comments:  No specialty comments available.  Imaging: No results found.   PMFS History: Patient Active Problem List   Diagnosis Date Noted   Other intervertebral disc degeneration, lumbar region 42/70/6237   Acute metabolic encephalopathy 62/83/1517   ETOH abuse 11/28/2021   Hypocalcemia 11/27/2021   Nodule of left lung 08/18/2021   Demand ischemia 08/17/2021   Sepsis ([place]) 08/16/2021   Hypoglycemia    Persistent atrial fibrillation/flutter with rapid ventricular response ([place]) 07/16/2021   AKI (acute kidney injury) ([place]) 07/16/2021   Chronic combined systolic and diastolic CHF (congestive heart failure) ([place]) 07/16/2021   Hyponatremia 07/16/2021   Secondary hypercoagulable state ([place]) 12/25/2020   Alcohol-induced chronic pancreatitis ([place]) 12/17/2020   Delirium tremens ([place])  12/17/2020   COPD with chronic bronchitis and emphysema ([place])    Hearing loss 07/26/2019   Chronic back pain 05/02/2019   Mild renal insufficiency 08/25/2017   Prepyloric ulcer 12/12/2014   Essential hypertension 12/07/2014   Mitral valve prolapse 12/06/2014   Atrial fibrillation with RVR ([place]) 11/26/2014   GERD (gastroesophageal reflux disease) 02/06/2013   Insulin dependent type 2 diabetes mellitus ([place]) 02/04/2013   Uncontrolled type 1 diabetes mellitus with hypoglycemia, with long-term current use of insulin ([place]) 02/16/2011   Past Medical History:  Diagnosis Date   Arthritis    Bladder cancer ([place])    Chronic back pain    Chronic combined systolic and diastolic CHF (congestive heart failure) ([place]) 07/16/2021   COPD (chronic obstructive pulmonary disease) ([facility])    DDD (degenerative disc disease)    Diabetic retinopathy of both eyes ([place])    Essential hypertension    GERD (gastroesophageal reflux disease)    History of atrial flutter 02/2011   Converted to NSR with Cardizem   History of chronic bronchitis    History of hemolytic anemia 02/2011   secondary to Avelox   HOH (hard of hearing)    HOH (hard of hearing)    no eardrum and nerve damage on R, also HOH on L   Mitral valve prolapse    a. 2D Echo 11/27/14: EF 55-60%; images were inadequate for LV wall motion assessment, + mild late systolic mitral valve prolapse involving the anterior leaflet.   PAD (peripheral artery disease) ([place]) 04/2014   Dr Trula Slade; bilateral SFA occlusion, R mid, L distal   PAF (paroxysmal atrial fibrillation) ([place])    a. Dx 11/2014 during admission for perf ulcer.   Perforated ulcer ([place])    a. 11/2014 s/p surgery.   Productive cough    Smokers' cough ([place])    Type 1 diabetes mellitus ([place]) 1977    Family History  Problem Relation Age of Onset   Breast cancer Mother    Cancer Mother        Breast   Rheumatic fever Father    Heart disease Father    Heart attack Father        Massive     Diabetes Son     Past Surgical History:  Procedure Laterality Date   CARDIOVERSION N/A 01/20/2021   Procedure: CARDIOVERSION;  Surgeon: Fay Records, MD;  Location: [facility] ENDOSCOPY;  Service: Cardiovascular;  Laterality: N/A;   CYSTOSCOPY WITH URETEROSCOPY Right 08/14/2013   Procedure: CYSTOSCOPY WITH URETEROSCOPY BLADDER BIOPSY ;  Surgeon: Claybon Jabs, MD;  Location: [facility];  Service: Urology;  Laterality: Right;   ESOPHAGOGASTRODUODENOSCOPY N/A 02/06/2013  Procedure: ESOPHAGOGASTRODUODENOSCOPY (EGD);  Surgeon: Lafayette Dragon, MD;  Location: [facility] ENDOSCOPY;  Service: Endoscopy;  Laterality: N/A;   LAPAROSCOPY N/A 11/25/2014   Procedure: LAPAROSCOPIC PRIMARY REPAIR OF PERFORATED PREPYLORIC ULCER WITH Silvestre Gunner;  Surgeon: Greer Pickerel, MD;  Location: [facility];  Service: General;  Laterality: N/A;   TONSILLECTOMY  as child   TRANSTHORACIC ECHOCARDIOGRAM  02-17-2011   MODERATE LVH/  EF 65%   TRANSURETHRAL RESECTION OF BLADDER TUMOR WITH GYRUS (TURBT-GYRUS) N/A 06/12/2013   Procedure: TRANSURETHRAL RESECTION OF BLADDER TUMOR WITH GYRUS (TURBT-GYRUS);  Surgeon: Claybon Jabs, MD;  Location: [facility];  Service: Urology;  Laterality: N/A;   TYMPANIC MEMBRANE REPAIR  as child   Social History   Occupational History   Occupation: Tobacco Psychologist, sport and exercise  Tobacco Use   Smoking status: Every Day    Packs/day: 2.00    Years: 30.00    Total pack years: 60.00    Types: Cigarettes   Smokeless tobacco: Former    Quit date: 06/08/1978   Tobacco comments:    Couple packs per day 11/04/21  Vaping Use   Vaping Use: Never used  Substance and Sexual Activity   Alcohol use: Not Currently    Comment: 5 quarts per week   Drug use: No   Sexual activity: Not on file

## 2021-12-11 NOTE — Therapy (Signed)
OUTPATIENT PHYSICAL THERAPY THORACOLUMBAR TREATMENT   Patient Name: Thomas Sandoval. MRN: [id number] DOB:1955-08-10, 66 y.o., male Today's Date: 12/11/2021   PT End of Session - 12/11/21 1812     Visit Number 4    Number of Visits 6    Date for PT Re-Evaluation 12/26/21    PT Start Time 1115    PT Stop Time 1150    PT Time Calculation (min) 35 min              Past Medical History:  Diagnosis Date   Arthritis    Bladder cancer ([place])    Chronic back pain    Chronic combined systolic and diastolic CHF (congestive heart failure) ([place]) 07/16/2021   COPD (chronic obstructive pulmonary disease) ([facility])    DDD (degenerative disc disease)    Diabetic retinopathy of both eyes ([facility])    Essential hypertension    GERD (gastroesophageal reflux disease)    History of atrial flutter 02/2011   Converted to NSR with Cardizem   History of chronic bronchitis    History of hemolytic anemia 02/2011   secondary to Avelox   HOH (hard of hearing)    HOH (hard of hearing)    no eardrum and nerve damage on R, also HOH on L   Mitral valve prolapse    a. 2D Echo 11/27/14: EF 55-60%; images were inadequate for LV wall motion assessment, + mild late systolic mitral valve prolapse involving the anterior leaflet.   PAD (peripheral artery disease) ([place]) 04/2014   Dr Trula Slade; bilateral SFA occlusion, R mid, L distal   PAF (paroxysmal atrial fibrillation) ([place])    a. Dx 11/2014 during admission for perf ulcer.   Perforated ulcer ([place])    a. 11/2014 s/p surgery.   Productive cough    Smokers' cough ([place])    Type 1 diabetes mellitus ([place]) 1977   Past Surgical History:  Procedure Laterality Date   CARDIOVERSION N/A 01/20/2021   Procedure: CARDIOVERSION;  Surgeon: Fay Records, MD;  Location: [facility];  Service: Cardiovascular;  Laterality: N/A;   CYSTOSCOPY WITH URETEROSCOPY Right 08/14/2013   Procedure: CYSTOSCOPY WITH URETEROSCOPY BLADDER BIOPSY ;  Surgeon: Claybon Jabs, MD;  Location:  [facility];  Service: Urology;  Laterality: Right;   ESOPHAGOGASTRODUODENOSCOPY N/A 02/06/2013   Procedure: ESOPHAGOGASTRODUODENOSCOPY (EGD);  Surgeon: Lafayette Dragon, MD;  Location: [facility] ENDOSCOPY;  Service: Endoscopy;  Laterality: N/A;   LAPAROSCOPY N/A 11/25/2014   Procedure: LAPAROSCOPIC PRIMARY REPAIR OF PERFORATED PREPYLORIC ULCER WITH Silvestre Gunner;  Surgeon: Greer Pickerel, MD;  Location: [place];  Service: General;  Laterality: N/A;   TONSILLECTOMY  as child   TRANSTHORACIC ECHOCARDIOGRAM  02-17-2011   MODERATE LVH/  EF 65%   TRANSURETHRAL RESECTION OF BLADDER TUMOR WITH GYRUS (TURBT-GYRUS) N/A 06/12/2013   Procedure: TRANSURETHRAL RESECTION OF BLADDER TUMOR WITH GYRUS (TURBT-GYRUS);  Surgeon: Claybon Jabs, MD;  Location: [facility];  Service: Urology;  Laterality: N/A;   TYMPANIC MEMBRANE REPAIR  as child   Patient Active Problem List   Diagnosis Date Noted   Other intervertebral disc degeneration, lumbar region 81/19/1478   Acute metabolic encephalopathy 29/56/2130   ETOH abuse 11/28/2021   Hypocalcemia 11/27/2021   Nodule of left lung 08/18/2021   Demand ischemia 08/17/2021   Sepsis ([place]) 08/16/2021   Hypoglycemia    Persistent atrial fibrillation/flutter with rapid ventricular response ([place]) 07/16/2021   AKI (acute kidney injury) ([place]) 07/16/2021   Chronic combined systolic and diastolic  CHF (congestive heart failure) ([place]) 07/16/2021   Hyponatremia 07/16/2021   Secondary hypercoagulable state ([place]) 12/25/2020   Alcohol-induced chronic pancreatitis ([place]) 12/17/2020   Delirium tremens ([place]) 12/17/2020   COPD with chronic bronchitis and emphysema ([place])    Hearing loss 07/26/2019   Chronic back pain 05/02/2019   Mild renal insufficiency 08/25/2017   Prepyloric ulcer 12/12/2014   Essential hypertension 12/07/2014   Mitral valve prolapse 12/06/2014   Atrial fibrillation with RVR ([place]) 11/26/2014   GERD (gastroesophageal reflux disease) 02/06/2013    Insulin dependent type 2 diabetes mellitus ([place]) 02/04/2013   Uncontrolled type 1 diabetes mellitus with hypoglycemia, with long-term current use of insulin ([place]) 02/16/2011    PCP: Chevis Pretty, FNP  REFERRING PROVIDER: Gertie Gowda, DO  REFERRING DIAG: Chronic bilateral low back pain without sciatica  Rationale for Evaluation and Treatment Rehabilitation  THERAPY DIAG:  Other low back pain  ONSET DATE: 15 years ago  SUBJECTIVE:                                                                                                                                                                                           SUBJECTIVE STATEMENT: Doing about the same. Rough day today. PERTINENT HISTORY:  Patient is hard of hearing, a-fib, HTN, COPD, DM, CHF, PAD, OA, history of cancer, and DDD  PAIN:  Are you having pain? Yes: NPRS scale: 9-10/10 Pain location: low back Pain description: "it just hurts"  Aggravating factors: bending over, riding his tractor Relieving factors: laying supine   PRECAUTIONS: None  WEIGHT BEARING RESTRICTIONS No  FALLS:  Has patient fallen in last 6 months? No  LIVING ENVIRONMENT: Lives with: lives with their family Lives in: House/apartment Stairs: Yes: Internal: 1 steps; none and External: 4-5 steps; on right going up  OCCUPATION: farmer; supervises workers   PLOF: Independent  PATIENT GOALS go to pain management (requires PT prior pain management)    OBJECTIVE:  TODAY'S TREATMENT    12-11-21                                     EXERCISE LOG  Exercise Repetitions and Resistance Comments  Nustep  L3 x 10 mins   Hooklying AB bracing X10 hold 5 secs   Hooklying leg raise X6 hold 5 secs each LE   Bridge  X 10 hold 3-5 secs   SKTC X5 hold 10-15 secs each LE    Blank cell = exercise not performed today    PATIENT EDUCATION:  Education details: POC, healing, pain management, prognosis  Person educated: Patient Education method:  Explanation Education comprehension: verbalized understanding   HOME EXERCISE PROGRAM:   ASSESSMENT:  CLINICAL IMPRESSION: Pt arrived today reporting increased LB soreness after last Rx due to Estim. Rx focused on therex for LE's as well as core activation techniques. He was able to perform gentle core exs with fair response. HEP Handout was given fot AB bracing, Bracing with marching , as well as bridging.     OBJECTIVE IMPAIRMENTS Abnormal gait, decreased activity tolerance, decreased mobility, difficulty walking, decreased ROM, decreased strength, hypomobility, impaired flexibility, impaired tone, postural dysfunction, and pain.   ACTIVITY LIMITATIONS carrying, lifting, bending, standing, squatting, stairs, transfers, and locomotion level  PARTICIPATION LIMITATIONS: shopping, community activity, occupation, and yard work  PERSONAL FACTORS Behavior pattern, Profession, Time since onset of injury/illness/exacerbation, and 3+ comorbidities: Patient is hard of hearing, a-fib, HTN, COPD, DM, CHF, PAD, OA, history of cancer, and DDD  are also affecting patient's functional outcome.   REHAB POTENTIAL: Fair    CLINICAL DECISION MAKING: Evolving/moderate complexity  EVALUATION COMPLEXITY: Moderate   GOALS: Goals reviewed with patient? No  LONG TERM GOALS: Target date: 12/22/2021  Patient will be independent with his HEP.  Baseline:  Goal status: INITIAL  2.  Patient will be able to complete his daily activities without his familiar low back pain exceeding 7/10.  Baseline:  Goal status: INITIAL  3.  Patient will be able to pick up at least 5 pounds from the floor without being limited by his familiar back pain.  Baseline:  Goal status: INITIAL  PLAN: PT FREQUENCY: 2x/week  PT DURATION: 3 weeks  PLANNED INTERVENTIONS: Therapeutic exercises, Therapeutic activity, Neuromuscular re-education, Balance training, Gait training, Patient/Family education, Self Care, Joint  mobilization, Stair training, Electrical stimulation, Spinal mobilization, Cryotherapy, Moist heat, Traction, Ultrasound, Manual therapy, and Re-evaluation.  PLAN FOR NEXT SESSION: nustep, lumbar stabilization, and modalities for pain management    Rayann Jolley,CHRIS, PTA 12/11/2021, 6:13 PM

## 2021-12-12 ENCOUNTER — Ambulatory Visit: Payer: BC Managed Care – PPO | Admitting: Physician Assistant

## 2021-12-16 ENCOUNTER — Ambulatory Visit: Payer: BC Managed Care – PPO | Admitting: *Deleted

## 2021-12-17 ENCOUNTER — Encounter: Payer: Self-pay | Admitting: Emergency Medicine

## 2021-12-17 ENCOUNTER — Ambulatory Visit: Payer: BC Managed Care – PPO | Admitting: Emergency Medicine

## 2021-12-17 DIAGNOSIS — R911 Solitary pulmonary nodule: Secondary | ICD-10-CM | POA: Diagnosis not present

## 2021-12-17 DIAGNOSIS — J439 Emphysema, unspecified: Secondary | ICD-10-CM

## 2021-12-17 DIAGNOSIS — Z720 Tobacco use: Secondary | ICD-10-CM

## 2021-12-17 DIAGNOSIS — J4489 Other specified chronic obstructive pulmonary disease: Secondary | ICD-10-CM | POA: Diagnosis not present

## 2021-12-17 MED ORDER — AMOXICILLIN-POT CLAVULANATE 875-125 MG PO TABS: 1.0000 | ORAL_TABLET | Freq: Two times a day (BID) | ORAL | 0 refills | Status: DC

## 2021-12-17 NOTE — Assessment & Plan Note (Signed)
Some confusion about his nebulized medication.  I did clarify with him that I want him to use the ipratropium/albuterol 4 times a day on a schedule, he has levalbuterol to use as needed.

## 2021-12-17 NOTE — Assessment & Plan Note (Signed)
2 left upper lobe nodules.  The rounded larger nodule with cavitation looks slightly smaller to me on the most recent CT.  It was measured at 1.7 cm which is stable compared with June.  I suspect that this is a small lung abscess.  He still has purulent mucus, symptoms consistent with bronchitis/pneumonia.  I will treat him with an extended course of Augmentin and then repeat a scan to look for interval resolution.  If the nodule grows then I think we will have to consider navigational bronchoscopy.

## 2021-12-17 NOTE — Progress Notes (Signed)
Subjective:    Patient ID: Thomas Sandoval., male    DOB: 05/28/1955, 66 y.o.   MRN: [id number]  HPI 66 year old man, heavy smoker 2 packs a day (60 pack years) with a history of associated COPD and chronic bronchitic symptoms, hypertension, diabetes, a fibrillation/flutter, mitral valve prolapse, bladder cancer, PVD, PUD with prior ulcer perforation 2016. He is referred today for COPD, sputum production, abnormal CT scan of the chest.  He is apparently been treated for flares/pneumonia multiple times over the last 3 to 4 months.  His most recent hospitalization was in June 2023 for community-acquired versus aspiration pneumonia and associated sepsis, hypoxemic respiratory failure. Currently managed on Trelegy, Zyrtec.  Has albuterol and DuoNeb and uses about 3-4x a day.  He has daily cough, yellow mucous. He is SOB with exertion.   CT chest performed 08/17/2021 reviewed by me shows bilateral patchy groundglass airspace disease with right lower lobe consolidation consistent with pneumonia.  Also scattered nodular opacities largest 1.7 cm left upper lobe, question infectious versus malignancy.  ROV 12/17/2021 --Ms. Besecker has a history of tobacco abuse with associated COPD and chronic bronchus symptoms.  Also with an abnormal CT scan of the chest with scattered pulmonary nodular disease, most notable 1.7 cm nodule in the left upper lobe. He is still coughing yellow sputum, may have improved some after doxy in September. He has cut down cigarettes to less than 2 pks a day  Super D CT chest 12/01/2021 reviewed by me showed scale resolution of right-sided pulmonary infiltrates, 1.7 cm left upper lobe rounded nodule with cavitation, stable in size but more cavitary than on prior from June.  6 mm left upper lobe nodule more anteriorly that is stable in size   Review of Systems As per HPI  Past Medical History:  Diagnosis Date   Arthritis    Bladder cancer ([place])    Chronic back pain     Chronic combined systolic and diastolic CHF (congestive heart failure) ([place]) 07/16/2021   COPD (chronic obstructive pulmonary disease) ([facility])    DDD (degenerative disc disease)    Diabetic retinopathy of both eyes ([facility])    Essential hypertension    GERD (gastroesophageal reflux disease)    History of atrial flutter 02/2011   Converted to NSR with Cardizem   History of chronic bronchitis    History of hemolytic anemia 02/2011   secondary to Avelox   HOH (hard of hearing)    HOH (hard of hearing)    no eardrum and nerve damage on R, also HOH on L   Mitral valve prolapse    a. 2D Echo 11/27/14: EF 55-60%; images were inadequate for LV wall motion assessment, + mild late systolic mitral valve prolapse involving the anterior leaflet.   PAD (peripheral artery disease) ([place]) 04/2014   Dr Trula Slade; bilateral SFA occlusion, R mid, L distal   PAF (paroxysmal atrial fibrillation) ([place])    a. Dx 11/2014 during admission for perf ulcer.   Perforated ulcer ([place])    a. 11/2014 s/p surgery.   Productive cough    Smokers' cough ([place])    Type 1 diabetes mellitus ([place]) 1977     Family History  Problem Relation Age of Onset   Breast cancer Mother    Cancer Mother        Breast   Rheumatic fever Father    Heart disease Father    Heart attack Father        Massive    Diabetes  Son      Social History   Socioeconomic History   Marital status: Married    Spouse name: Not on file   Number of children: Not on file   Years of education: Not on file   Highest education level: Not on file  Occupational History   Occupation: Tobacco Farmer  Tobacco Use   Smoking status: Every Day    Packs/day: 2.00    Years: 30.00    Total pack years: 60.00    Types: Cigarettes   Smokeless tobacco: Former    Quit date: 06/08/1978   Tobacco comments:     1 packs per day [facility] 12/17/21  Vaping Use   Vaping Use: Never used  Substance and Sexual Activity   Alcohol use: Not Currently    Comment: 5 quarts per week    Drug use: No   Sexual activity: Not on file  Other Topics Concern   Not on file  Social History Narrative   Lives in [place] with wife and 2 sons.    Social Determinants of Health   Financial Resource Strain: Not on file  Food Insecurity: Unknown (11/27/2021)   Hunger Vital Sign    Worried About Running Out of Food in the Last Year: Patient refused    [place] in the Last Year: Patient refused  Transportation Needs: Unknown (11/27/2021)   PRAPARE - Hydrologist (Medical): Patient refused    Lack of Transportation (Non-Medical): Patient refused  Physical Activity: Not on file  Stress: Not on file  Social Connections: Not on file  Intimate Partner Violence: Unknown (11/27/2021)   Humiliation, Afraid, Rape, and Kick questionnaire    Fear of Current or Ex-Partner: Patient refused    Emotionally Abused: Patient refused    Physically Abused: Patient refused    Sexually Abused: Patient refused     Allergies  Allergen Reactions   Azithromycin Other (See Comments) and Nausea And Vomiting    "Severe stomach cramps; told to list as an allergy by dr. Huel Cote ago"   Avelox [Moxifloxacin Hcl In Nacl] Other (See Comments)    Hemolysis  In 2012   Bactrim [Sulfamethoxazole-Trimethoprim] Diarrhea and Nausea And Vomiting   Moxifloxacin Other (See Comments)   Sulfamethoxazole-Trimethoprim Other (See Comments)     Outpatient Medications Prior to Visit  Medication Sig Dispense Refill   albuterol (VENTOLIN HFA) 108 (90 Base) MCG/ACT inhaler Inhale 2 puffs into the lungs every 6 (six) hours as needed for wheezing or shortness of breath. 18 g 2   cetirizine (ZYRTEC) 10 MG tablet Take 10 mg by mouth daily.     doxycycline (VIBRA-TABS) 100 MG tablet Take 1 tablet (100 mg total) by mouth 2 (two) times daily. 14 tablet 0   empagliflozin (JARDIANCE) 10 MG TABS tablet Take 1 tablet (10 mg total) by mouth daily before breakfast. 30 tablet 11   famotidine (PEPCID) 20 MG  tablet Take 1 tablet (20 mg total) by mouth every morning. Reported on 04/16/2015 30 tablet 5   furosemide (LASIX) 40 MG tablet Take 0.5 tablets (20 mg total) by mouth daily. 90 tablet 3   HYDROcodone-acetaminophen (NORCO/VICODIN) 5-325 MG tablet Take 1 tablet by mouth 2 (two) times daily as needed for severe pain. 60 tablet 0   insulin glargine (LANTUS) 100 UNIT/ML injection Inject 0.2 mLs (20 Units total) into the skin daily. 30 mL 5   Insulin Syringe-Needle U-100 (B-D INS SYR ULTRAFINE 1CC/30G) 30G X 1/2" 1 ML MISC  Use 4 times a day with insulin Dx E11.9 400 each 3   Insulin Syringes, Disposable, U-100 1 ML MISC Use 4 times a day for insulin injection Dx E11.9 400 each 5   ipratropium-albuterol (DUONEB) 0.5-2.5 (3) MG/3ML SOLN TAKE 3 MLS BY NEBULIZATION EVERY 4 (FOUR) HOURS AS NEEDED. 360 mL 0   levalbuterol (XOPENEX) 0.31 MG/3ML nebulizer solution Take 3 mLs (0.31 mg total) by nebulization every 4 (four) hours as needed for wheezing. 3 mL 12   losartan (COZAAR) 50 MG tablet Take 1 tablet (50 mg total) by mouth at bedtime. hold this medication until follow up     metoprolol succinate (TOPROL-XL) 100 MG 24 hr tablet Take 1 tablet (100 mg total) by mouth daily. Take with or immediately following a meal. 90 tablet 3   naloxone (NARCAN) nasal spray 4 mg/0.1 mL Place 1 spray into the nose as needed (For opiate overdose). 1 each 2   NOVOLOG 100 UNIT/ML injection PER SLIDING SCALE: 190 - 200 = 2 UNITS. 300 AND ABOVE = 7 UNITS. 10 mL 1   OVER THE COUNTER MEDICATION Take 1 Scoop by mouth in the morning and at bedtime. Super Beets     rivaroxaban (XARELTO) 20 MG TABS tablet Take 1 tablet (20 mg total) by mouth daily with supper. 30 tablet 5   No facility-administered medications prior to visit.         Objective:   Physical Exam  Vitals:   12/17/21 1256  BP: 138/74  Pulse: 66  Temp: 98.2 F (36.8 C)  TempSrc: Oral  SpO2: 93%  Weight: 183 lb 9.6 oz (83.3 kg)  Height: '5\' 9"'$  (1.753 m)   Gen:  Pleasant, well-nourished, in no distress,  normal affect  ENT: No lesions,  mouth clear,  oropharynx clear, no postnasal drip, very poor hearing   Neck: No JVD, no stridor  Lungs: No use of accessory muscles, coarse bilaterally with scattered rhonchi, expiratory wheeze on forced expiration  Cardiovascular: RRR, heart sounds normal, no murmur or gallops, no peripheral edema  Musculoskeletal: No deformities, no cyanosis or clubbing  Neuro: alert, awake, non focal  Skin: Warm, no lesions or rash     Assessment & Plan:  Nodule of left lung 2 left upper lobe nodules.  The rounded larger nodule with cavitation looks slightly smaller to me on the most recent CT.  It was measured at 1.7 cm which is stable compared with June.  I suspect that this is a small lung abscess.  He still has purulent mucus, symptoms consistent with bronchitis/pneumonia.  I will treat him with an extended course of Augmentin and then repeat a scan to look for interval resolution.  If the nodule grows then I think we will have to consider navigational bronchoscopy.  COPD with chronic bronchitis and emphysema ([place]) Some confusion about his nebulized medication.  I did clarify with him that I want him to use the ipratropium/albuterol 4 times a day on a schedule, he has levalbuterol to use as needed.  Tobacco use He has cut down but continues to smoke just under 2 packs a day.  I encouraged him to try to get down to 1 pack daily.  Once he is able to accomplish this we can talk more seriously about a routine to wean and ultimately set a quit date.   Baltazar Apo, MD, PhD 12/17/2021, 1:17 PM [facility] [phone] or if no answer before 7:00PM call [phone] For any issues after 7:00PM please  call eLink [phone]

## 2021-12-17 NOTE — Patient Instructions (Addendum)
We reviewed your CT scan of the chest today. Please take Augmentin 875 mg twice a day for 14 days. Use your ipratropium/albuterol (DuoNeb) nebulizers 4 times a day on a schedule.  This is a maintenance medicine. Use your leave albuterol nebulizer up to every 4 hours if you need it for shortness of breath, chest tightness, wheezing.  This is a rescue medicine Continue to work on decreasing your cigarettes. Follow Dr. Lamonte Sakai in 3 months after CT scan so we can review the results together.

## 2021-12-17 NOTE — Assessment & Plan Note (Signed)
He has cut down but continues to smoke just under 2 packs a day.  I encouraged him to try to get down to 1 pack daily.  Once he is able to accomplish this we can talk more seriously about a routine to wean and ultimately set a quit date.

## 2021-12-18 ENCOUNTER — Encounter: Payer: BC Managed Care – PPO | Admitting: *Deleted

## 2021-12-19 DIAGNOSIS — J9601 Acute respiratory failure with hypoxia: Secondary | ICD-10-CM | POA: Diagnosis not present

## 2021-12-22 ENCOUNTER — Encounter: Payer: Self-pay | Admitting: Physical Medicine and Rehabilitation

## 2021-12-22 ENCOUNTER — Encounter
Payer: BC Managed Care – PPO | Attending: Physical Medicine and Rehabilitation | Admitting: Physical Medicine and Rehabilitation

## 2021-12-22 VITALS — BP 128/73 | HR 65 | Ht 69.0 in | Wt 180.0 lb

## 2021-12-22 DIAGNOSIS — M5136 Other intervertebral disc degeneration, lumbar region: Secondary | ICD-10-CM | POA: Diagnosis not present

## 2021-12-22 DIAGNOSIS — M545 Low back pain, unspecified: Secondary | ICD-10-CM | POA: Diagnosis not present

## 2021-12-22 DIAGNOSIS — G8929 Other chronic pain: Secondary | ICD-10-CM | POA: Diagnosis not present

## 2021-12-22 DIAGNOSIS — G894 Chronic pain syndrome: Secondary | ICD-10-CM | POA: Diagnosis not present

## 2021-12-22 MED ORDER — HYDROCODONE-ACETAMINOPHEN 5-325 MG PO TABS: 1.0000 | ORAL_TABLET | Freq: Three times a day (TID) | ORAL | 0 refills | Status: DC | PRN

## 2021-12-22 NOTE — Assessment & Plan Note (Signed)
Per Dr. Lorin Mercy 10/5, imaging shows some foraminal stenosis on the R (?levels) however not a candidate for surgical decompression until he quits smoking.  Pt will request imaging records from Dr. Lorin Mercy' office with plan for possible injections in the future; pt remains highly cautious of this line of treatment.

## 2021-12-22 NOTE — Assessment & Plan Note (Signed)
Indication for chronic opioid: M54.9 Medication and dose: Norco 5-325 mg TID PRN # pills per month: 90 Last UDS date: 11/17/21 Opioid Treatment Agreement signed (Y/N): Y Opioid Treatment Agreement last reviewed with patient:  11/17/21 [facility] reviewed this encounter (include red flags):  Y  UDS every 3-6 months - due next visit [place] check every visit Follow up Q1M; if next UDS compliant/pill count compliant/pain regimen stable, can move to Q3M visits.

## 2021-12-22 NOTE — Patient Instructions (Addendum)
Fill out a records request up front for Lumbar Xray results from Dr. Lorin Mercy' office. We can discuss possible injections to help with your pain once we have those results.  Finish your PT and continue home exercise program as prescribed.  Continue to work on increasing your blood sugars and cutting back on your smoking with your primary care doctor.  You have been prescribed Norco 5 mg three times daily as needed for severe pain, #90 TABLETS. If you are about to run out, [place] REFILLS.  We will see you back in one month. Bring your Norco with you to that appointment. If things are going well at that time, we will repeat a urine screen and can move to follow ups every 3 months.   Call clinic if you have any concerns or need to be seen before 1 month.

## 2021-12-22 NOTE — Assessment & Plan Note (Signed)
Was going to trial Tramadol with Norco 5 mg for adjunctive control, however with recent ?seizure with ongoing intermittent hypoglycemia, feel risks of Tramadol not worth it at this time.  See below; will increase Norco 5 mg to TID PRN dosing at this time to hopefully provide improved pain control.   Did confirm with wife she has Narcan and understands indications for use, given multiple comorbidity/risks for respiratory depression.

## 2021-12-22 NOTE — Progress Notes (Signed)
Subjective:    Patient ID: Thomas Medici., male    DOB: 1956/01/12, 66 y.o.   MRN: [id number]  HPI  Thomas Adamcik. is a 66 y.o. year old male presenting to PM&R clinic for pain management follow up. They were referred by Duane Boston, FNP for treatment of low back pain. Pain has been poorly controlled on Oxycodone 5 mg BID PRN; no other interventions tried in recent history, however patient is cautious of interventions and does not believe he can perform ADLs or continue working without use of narcotic pain medication.    Plan from 11/17/21: Chronic bilateral low back pain without sciatica Assessment & Plan: Ordered xray 2 view lumbar spine for suspected facet arthropathy as cause of LBP. If present, will discuss with patient referral to Dr. Bonney Aid for nerve block/ ablation.    PDMP reviewed and appropriate. Controlled substance contract signed today. UDS ordered.   Hope to wean off Norco and onto Tramadol with subsequent visits.     PT script for low back pain ordered today, with trial of TENs unit and abdominal strengthening for core stability.    Follow up prior to 10/16 for medication refill and to discuss PT effect, xray results.    Admission for long-term opiate analgesic use -     Naloxone HCl; Place 1 spray into the nose as needed (For opiate overdose).  Dispense: 1 each; Refill: 2  Interval Hx:   - Saw Dr. Lorin Mercy with Ortho 10/5: Plan: Multiple medical problems with foraminal stenosis on the right.  He does have some hip osteoarthritis on the right.  We discussed that if he is able quit smoking to return we could proceed with diagnostic work-up of his back.  He would not be a candidate for decompression fusion unless he quit smoking. He is currently smoking 2 ppd, working on cutting down but is not ready to quit. He remains cautious regarding potential back injections or surgery.   - Pt states he did get lumbar xray; showed "spurs" on the bottom of his  spine with some collapsed vertebrae; unsure of what it said. Does not have records today. Unable to access images or read through [place].   - Patient had ER visit for seizure due to hypoglycemia 9/21. States he was unarrousable, BG 40, with tongue biting. Since then, has been working with his PCP on more liberal blood glucose goals, and has had no further episodes; does occasionally have lows related to pain or "if he's just sitting around he might not notice it".  - Currently Tx for PNA by pulmonology, who are monitorring a lung nodule they believe may be an abscess. He usually does not require oxygen but inquires what a "dangerous" low might be on a home pulse oximeter.   - Pain control remains poor on Norco 5 mg BID. 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 He denies side effects from [place].   - He has been to 6/8 sessions of PT with no benefit and increased overall pain in his legs and low back. He tried e-stim at PT, which greatly increased his pain. He finds HEP stretches mildly helpful.   Pain Inventory Average Pain 7 Pain Right Now 5 My pain is dull and aching  In the last 24 hours, has pain interfered with the following? General activity  7 Relation with others 0 Enjoyment of life 1 What TIME of day is your pain at its worst? morning , daytime, evening, and night Sleep (in general) Fair  Pain is worse with: walking, bending, sitting, and standing Pain improves with: rest and medication Relief from Meds: 7  Family History  Problem Relation Age of Onset   Breast cancer Mother    Cancer Mother        Breast   Rheumatic fever Father    Heart disease Father    Heart attack Father        Massive    Diabetes Son    Social History   Socioeconomic History   Marital status: Married    Spouse name: Not on file   Number of children: Not  on file   Years of education: Not on file   Highest education level: Not on file  Occupational History   Occupation: Tobacco Psychologist, sport and exercise  Tobacco Use   Smoking status: Every Day    Packs/day: 2.00    Years: 30.00    Total pack years: 60.00    Types: Cigarettes   Smokeless tobacco: Former    Quit date: 06/08/1978   Tobacco comments:     1 packs per day ARJ 12/17/21  Vaping Use   Vaping Use: Never used  Substance and Sexual Activity   Alcohol use: Not Currently    Comment: 5 quarts per week   Drug use: No   Sexual activity: Not on file  Other Topics Concern   Not on file  Social History Narrative   Lives in [place] with wife and 2 sons.    Social Determinants of Health   Financial Resource Strain: Not on file  Food Insecurity: Unknown (11/27/2021)   Hunger Vital Sign    Worried About Running Out of Food in the Last Year: Patient refused    [place] in the Last Year: Patient refused  Transportation Needs: Unknown (11/27/2021)   PRAPARE - Hydrologist (Medical): Patient refused    Lack of Transportation (Non-Medical): Patient refused  Physical Activity: Not on file  Stress: Not on file  Social Connections: Not on file   Past Surgical History:  Procedure Laterality Date   CARDIOVERSION N/A 01/20/2021   Procedure: CARDIOVERSION;  Surgeon: Fay Records, MD;  Location: [facility];  Service: Cardiovascular;  Laterality: N/A;   CYSTOSCOPY WITH URETEROSCOPY Right 08/14/2013   Procedure: CYSTOSCOPY WITH URETEROSCOPY BLADDER BIOPSY ;  Surgeon: Claybon Jabs, MD;  [facility];  Service: Urology;  Laterality: Right;   ESOPHAGOGASTRODUODENOSCOPY N/A 02/06/2013   Procedure: ESOPHAGOGASTRODUODENOSCOPY (EGD);  Surgeon: Lafayette Dragon, MD;  Location: [facility] ENDOSCOPY;  Service: Endoscopy;  Laterality: N/A;   LAPAROSCOPY N/A 11/25/2014   Procedure: LAPAROSCOPIC PRIMARY REPAIR OF PERFORATED PREPYLORIC ULCER WITH Silvestre Gunner;  Surgeon: Greer Pickerel, MD;  Location: [place];  Service: General;  Laterality: N/A;   TONSILLECTOMY  as child   TRANSTHORACIC ECHOCARDIOGRAM  02-17-2011   MODERATE LVH/  EF 65%   TRANSURETHRAL RESECTION OF BLADDER TUMOR WITH GYRUS (TURBT-GYRUS) N/A 06/12/2013   Procedure: TRANSURETHRAL RESECTION OF BLADDER TUMOR WITH GYRUS (TURBT-GYRUS);  Surgeon: Claybon Jabs, MD;  [facility];  Service: Urology;  Laterality: N/A;   TYMPANIC MEMBRANE REPAIR  as child   Past Surgical History:  Procedure Laterality Date   CARDIOVERSION N/A 01/20/2021   Procedure: CARDIOVERSION;  Surgeon: Fay Records, MD;  Location:  [facility] ENDOSCOPY;  Service: Cardiovascular;  Laterality: N/A;   CYSTOSCOPY WITH URETEROSCOPY Right 08/14/2013   Procedure: CYSTOSCOPY WITH URETEROSCOPY BLADDER BIOPSY ;  Surgeon: Claybon Jabs, MD;  Location: [facility];  Service: Urology;  Laterality: Right;   ESOPHAGOGASTRODUODENOSCOPY N/A 02/06/2013   Procedure: ESOPHAGOGASTRODUODENOSCOPY (EGD);  Surgeon: Lafayette Dragon, MD;  Location: [facility] ENDOSCOPY;  Service: Endoscopy;  Laterality: N/A;   LAPAROSCOPY N/A 11/25/2014   Procedure: LAPAROSCOPIC PRIMARY REPAIR OF PERFORATED PREPYLORIC ULCER WITH Silvestre Gunner;  Surgeon: Greer Pickerel, MD;  Location: [place];  Service: General;  Laterality: N/A;   TONSILLECTOMY  as child   TRANSTHORACIC ECHOCARDIOGRAM  02-17-2011   MODERATE LVH/  EF 65%   TRANSURETHRAL RESECTION OF BLADDER TUMOR WITH GYRUS (TURBT-GYRUS) N/A 06/12/2013   Procedure: TRANSURETHRAL RESECTION OF BLADDER TUMOR WITH GYRUS (TURBT-GYRUS);  Surgeon: Claybon Jabs, MD;  Location: [facility];  Service: Urology;  Laterality: N/A;   TYMPANIC MEMBRANE REPAIR  as child   Past Medical History:  Diagnosis Date   Arthritis    Bladder cancer ([place])    Chronic back pain    Chronic combined systolic and diastolic CHF (congestive heart failure) ([place]) 07/16/2021   COPD (chronic obstructive pulmonary disease) (HCC)    DDD  (degenerative disc disease)    Diabetic retinopathy of both eyes (HCC)    Essential hypertension    GERD (gastroesophageal reflux disease)    History of atrial flutter 02/2011   Converted to NSR with Cardizem   History of chronic bronchitis    History of hemolytic anemia 02/2011   secondary to Avelox   HOH (hard of hearing)    HOH (hard of hearing)    no eardrum and nerve damage on R, also HOH on L   Mitral valve prolapse    a. 2D Echo 11/27/14: EF 55-60%; images were inadequate for LV wall motion assessment, + mild late systolic mitral valve prolapse involving the anterior leaflet.   PAD (peripheral artery disease) ([place]) 04/2014   Dr Trula Slade; bilateral SFA occlusion, R mid, L distal   PAF (paroxysmal atrial fibrillation) ([place])    a. Dx 11/2014 during admission for perf ulcer.   Perforated ulcer ([place])    a. 11/2014 s/p surgery.   Productive cough    Smokers' cough (HCC)    Type 1 diabetes mellitus (HCC) 1977   BP 128/73   Pulse 65   Ht '5\' 9"'$  (1.753 m)   Wt 180 lb (81.6 kg)   SpO2 95%   BMI 26.58 kg/m   Opioid Risk Score:   Fall Risk Score:  `1  Depression screen PHQ 2/9     11/17/2021    9:30 AM 04/28/2021    2:17 PM 01/24/2021    2:25 PM 12/17/2020    2:28 PM 10/25/2020    2:45 PM 05/01/2020    2:23 PM 01/30/2020    2:15 PM  Depression screen PHQ 2/9  Decreased Interest 0 0 0 0 0 0 0  Down, Depressed, Hopeless 0 0 0 0 0 0 0  PHQ - 2 Score 0 0 0 0 0 0 0  Altered sleeping 1 0  0 0    Tired, decreased energy 1 0  0 0    Change in appetite 0 0  0 0    Feeling bad or failure about yourself  0 0  0 0    Trouble concentrating 0 0  0 0    Moving slowly or fidgety/restless 0  0  0 0    Suicidal thoughts 0 0  0 0    PHQ-9 Score 2 0  0 0    Difficult doing work/chores  Not difficult at all   Not difficult at all       Review of Systems  Musculoskeletal:  Positive for back pain.  All other systems reviewed and are negative.     Objective:   Physical  Exam  Constitution: Appropriate appearance for age. No apparent distress   HEENT: PERRL, EOMI grossly intact.  Resp: CTAB, reduced air movement. No rales, rhonchi, or wheezing. Cardio: RRR. No mumurs, rubs, or gallops. No peripheral edema. Abdomen: Nondistended. Nontender. +bowel sounds. Psych: Appropriate mood and affect Neuro: AAOx4. No apparent deficits. 5/5 strength in bilateral UE and LE in all myotomes. Reflexes 2+ throughout.    Back Exam:  Unchanged since last visit   SLR: + LBP, no radiation   Facet loading: + LBP, bilateral    TTP at paraspinals: none   Forward bending: negative Gait: Forward leaning, antalgic, no AD.        Assessment & Plan:   Thomas Bedwell. is a 66 y.o. year old male presenting to PM&R clinic for pain management follow up. They were referred by Duane Boston, FNP for treatment of low back pain. Pain has been poorly controlled on Oxycodone 5 mg BID PRN. He has undergone trial of PT and e-stim with worsening overall symptoms since last visit. 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   Chronic bilateral low back pain without sciatica Assessment & Plan: Was going to trial Tramadol with Norco 5 mg for adjunctive control, however with recent ?seizure with ongoing intermittent hypoglycemia, feel risks of Tramadol not worth it at this time.  See below; will increase Norco 5 mg to TID PRN dosing at this time to hopefully provide improved pain control.   Did confirm with wife she has Narcan and understands indications for use, given multiple comorbidity/risks for respiratory depression.   Other intervertebral disc degeneration, lumbar region Assessment & Plan: Per Dr. Lorin Mercy 10/5, imaging shows some foraminal stenosis on the R (?levels) however not a candidate for surgical decompression until he quits smoking.  Pt will request imaging records from Dr. Lorin Mercy' office with plan for possible injections in the future; pt  remains highly cautious of this line of treatment.     Chronic pain syndrome Assessment & Plan: Indication for chronic opioid: M54.9 Medication and dose: Norco 5-325 mg TID PRN # pills per month: 90 Last UDS date: 11/17/21 Opioid Treatment Agreement signed (Y/N): Y Opioid Treatment Agreement last reviewed with patient:  11/17/21 NCCSRS reviewed this encounter (include red flags):  Y  UDS every 3-6 months - due next visit [place] check every visit Follow up Q1M; if next UDS compliant/pill count compliant/pain regimen stable, can move to Q3M visits.     Other orders -     HYDROcodone-Acetaminophen; Take 1 tablet by mouth 3 (three) times daily as needed for severe pain.  Dispense: 90 tablet; Refill: [place], DO 12/22/2021

## 2021-12-23 ENCOUNTER — Encounter: Payer: Self-pay | Admitting: *Deleted

## 2021-12-23 ENCOUNTER — Ambulatory Visit: Payer: BC Managed Care – PPO | Admitting: *Deleted

## 2021-12-23 DIAGNOSIS — M5459 Other low back pain: Secondary | ICD-10-CM

## 2021-12-23 NOTE — Therapy (Signed)
OUTPATIENT PHYSICAL THERAPY THORACOLUMBAR TREATMENT   Patient Name: Thomas Sandoval. MRN: [id number] DOB:Aug 21, 1955, 66 y.o., male Today's Date: 12/23/2021   PT End of Session - 12/23/21 1529     Visit Number 5    Number of Visits 6    Date for PT Re-Evaluation 12/26/21    PT Start Time 3614    PT Stop Time 4315    PT Time Calculation (min) 34 min              Past Medical History:  Diagnosis Date   Arthritis    Bladder cancer ([place])    Chronic back pain    Chronic combined systolic and diastolic CHF (congestive heart failure) ([place]) 07/16/2021   COPD (chronic obstructive pulmonary disease) (HCC)    DDD (degenerative disc disease)    Diabetic retinopathy of both eyes (HCC)    Essential hypertension    GERD (gastroesophageal reflux disease)    History of atrial flutter 02/2011   Converted to NSR with Cardizem   History of chronic bronchitis    History of hemolytic anemia 02/2011   secondary to Avelox   HOH (hard of hearing)    HOH (hard of hearing)    no eardrum and nerve damage on R, also HOH on L   Mitral valve prolapse    a. 2D Echo 11/27/14: EF 55-60%; images were inadequate for LV wall motion assessment, + mild late systolic mitral valve prolapse involving the anterior leaflet.   PAD (peripheral artery disease) ([place]) 04/2014   Dr Trula Slade; bilateral SFA occlusion, R mid, L distal   PAF (paroxysmal atrial fibrillation) ([place])    a. Dx 11/2014 during admission for perf ulcer.   Perforated ulcer ([place])    a. 11/2014 s/p surgery.   Productive cough    Smokers' cough ([place])    Type 1 diabetes mellitus ([place]) 1977   Past Surgical History:  Procedure Laterality Date   CARDIOVERSION N/A 01/20/2021   Procedure: CARDIOVERSION;  Surgeon: Fay Records, MD;  Location: [place];  Service: Cardiovascular;  Laterality: N/A;   CYSTOSCOPY WITH URETEROSCOPY Right 08/14/2013   Procedure: CYSTOSCOPY WITH URETEROSCOPY BLADDER BIOPSY ;  Surgeon: Claybon Jabs, MD;  Location:  [facility];  Service: Urology;  Laterality: Right;   ESOPHAGOGASTRODUODENOSCOPY N/A 02/06/2013   Procedure: ESOPHAGOGASTRODUODENOSCOPY (EGD);  Surgeon: Lafayette Dragon, MD;  Location: [facility] ENDOSCOPY;  Service: Endoscopy;  Laterality: N/A;   LAPAROSCOPY N/A 11/25/2014   Procedure: LAPAROSCOPIC PRIMARY REPAIR OF PERFORATED PREPYLORIC ULCER WITH Silvestre Gunner;  Surgeon: Greer Pickerel, MD;  Location: [place];  Service: General;  Laterality: N/A;   TONSILLECTOMY  as child   TRANSTHORACIC ECHOCARDIOGRAM  02-17-2011   MODERATE LVH/  EF 65%   TRANSURETHRAL RESECTION OF BLADDER TUMOR WITH GYRUS (TURBT-GYRUS) N/A 06/12/2013   Procedure: TRANSURETHRAL RESECTION OF BLADDER TUMOR WITH GYRUS (TURBT-GYRUS);  Surgeon: Claybon Jabs, MD;  Location: [facility];  Service: Urology;  Laterality: N/A;   TYMPANIC MEMBRANE REPAIR  as child   Patient Active Problem List   Diagnosis Date Noted   Chronic pain syndrome 12/22/2021   Tobacco use 12/17/2021   Other intervertebral disc degeneration, lumbar region 40/10/6759   Acute metabolic encephalopathy 95/11/3265   ETOH abuse 11/28/2021   Hypocalcemia 11/27/2021   Nodule of left lung 08/18/2021   Demand ischemia 08/17/2021   Sepsis ([place]) 08/16/2021   Hypoglycemia    Persistent atrial fibrillation/flutter with rapid ventricular response ([place]) 07/16/2021   AKI (acute  kidney injury) ([place]) 07/16/2021   Chronic combined systolic and diastolic CHF (congestive heart failure) ([place]) 07/16/2021   Hyponatremia 07/16/2021   Secondary hypercoagulable state ([place]) 12/25/2020   Alcohol-induced chronic pancreatitis ([place]) 12/17/2020   Delirium tremens ([place]) 12/17/2020   COPD with chronic bronchitis and emphysema ([place])    Hearing loss 07/26/2019   Chronic back pain 05/02/2019   Mild renal insufficiency 08/25/2017   Prepyloric ulcer 12/12/2014   Essential hypertension 12/07/2014   Mitral valve prolapse 12/06/2014   Atrial fibrillation with RVR ([place])  11/26/2014   GERD (gastroesophageal reflux disease) 02/06/2013   Insulin dependent type 2 diabetes mellitus ([place]) 02/04/2013   Uncontrolled type 1 diabetes mellitus with hypoglycemia, with long-term current use of insulin ([place]) 02/16/2011    PCP: Chevis Pretty, FNP  REFERRING PROVIDER: Gertie Gowda, DO  REFERRING DIAG: Chronic bilateral low back pain without sciatica  Rationale for Evaluation and Treatment Rehabilitation  THERAPY DIAG:  Other low back pain  ONSET DATE: 15 years ago  SUBJECTIVE:                                                                                                                                                                                           SUBJECTIVE STATEMENT: Doing about the same. Doing exercises at home. No change in pain PERTINENT HISTORY:  Patient is hard of hearing, a-fib, HTN, COPD, DM, CHF, PAD, OA, history of cancer, and DDD  PAIN:  Are you having pain? Yes: NPRS scale: 8/10 Pain location: low back Pain description: "it just hurts"  Aggravating factors: bending over, riding his tractor Relieving factors: laying supine   PRECAUTIONS: None  WEIGHT BEARING RESTRICTIONS No  FALLS:  Has patient fallen in last 6 months? No  LIVING ENVIRONMENT: Lives with: lives with their family Lives in: House/apartment Stairs: Yes: Internal: 1 steps; none and External: 4-5 steps; on right going up  OCCUPATION: farmer; supervises workers   PLOF: Independent  PATIENT GOALS go to pain management (requires PT prior pain management)    OBJECTIVE:  TODAY'S TREATMENT    12-23-21                                     EXERCISE LOG  Exercise Repetitions and Resistance Comments  Nustep  L4 x 10 mins   Hooklying AB bracing X10 hold 5 secs   Hooklying leg raise X6 hold 5 secs each LE   Double knee raise X6 hold 10 secs   Bridge  X 10 hold 3-5 secs   SKTC X5 hold 10-15 secs  each LE    Blank cell = exercise not performed today     PATIENT EDUCATION:  Education details: POC, healing, pain management, prognosis Person educated: Patient Education method: Explanation Education comprehension: verbalized understanding   HOME EXERCISE PROGRAM:   ASSESSMENT:  CLINICAL IMPRESSION: Pt arrived today doing about the same as far as LBP and reports performing exs, but may be over doing it. He was able to continue on the nustep for 10 mins  and HEP was reviewed with double knee raise added today. Pt reports holding exs longer at home, but did well with Rx today. Advised Pt to hold only 5-10 secs at this time.    OBJECTIVE IMPAIRMENTS Abnormal gait, decreased activity tolerance, decreased mobility, difficulty walking, decreased ROM, decreased strength, hypomobility, impaired flexibility, impaired tone, postural dysfunction, and pain.   ACTIVITY LIMITATIONS carrying, lifting, bending, standing, squatting, stairs, transfers, and locomotion level  PARTICIPATION LIMITATIONS: shopping, community activity, occupation, and yard work  PERSONAL FACTORS Behavior pattern, Profession, Time since onset of injury/illness/exacerbation, and 3+ comorbidities: Patient is hard of hearing, a-fib, HTN, COPD, DM, CHF, PAD, OA, history of cancer, and DDD  are also affecting patient's functional outcome.   REHAB POTENTIAL: Fair    CLINICAL DECISION MAKING: Evolving/moderate complexity  EVALUATION COMPLEXITY: Moderate   GOALS: Goals reviewed with patient? No  LONG TERM GOALS: Target date: 12/22/2021  Patient will be independent with his HEP.  Baseline:  Goal status: On-going  2.  Patient will be able to complete his daily activities without his familiar low back pain exceeding 7/10.  Baseline:  Goal status: On-going    3.  Patient will be able to pick up at least 5 pounds from the floor without being limited by his familiar back pain.  Baseline:  Goal status: On-going  PLAN: PT FREQUENCY: 2x/week  PT DURATION: 3  weeks  PLANNED INTERVENTIONS: Therapeutic exercises, Therapeutic activity, Neuromuscular re-education, Balance training, Gait training, Patient/Family education, Self Care, Joint mobilization, Stair training, Electrical stimulation, Spinal mobilization, Cryotherapy, Moist heat, Traction, Ultrasound, Manual therapy, and Re-evaluation.  PLAN FOR NEXT SESSION: nustep, lumbar stabilization, and modalities for pain management    Hilding Quintanar,CHRIS, PTA 12/23/2021, 4:07 PM

## 2021-12-25 ENCOUNTER — Ambulatory Visit: Payer: BC Managed Care – PPO | Admitting: *Deleted

## 2021-12-25 ENCOUNTER — Encounter: Payer: Self-pay | Admitting: *Deleted

## 2021-12-25 DIAGNOSIS — M5459 Other low back pain: Secondary | ICD-10-CM

## 2021-12-25 NOTE — Therapy (Signed)
OUTPATIENT PHYSICAL THERAPY THORACOLUMBAR TREATMENT   Patient Name: Thomas Sandoval. MRN: [id number] DOB:1955/07/14, 66 y.o., male Today's Date: 12/25/2021   PT End of Session - 12/25/21 1530     Visit Number 6    Number of Visits 6    Date for PT Re-Evaluation 12/26/21    PT Start Time 3559    PT Stop Time 1540    PT Time Calculation (min) 25 min              Past Medical History:  Diagnosis Date   Arthritis    Bladder cancer ([place])    Chronic back pain    Chronic combined systolic and diastolic CHF (congestive heart failure) ([place]) 07/16/2021   COPD (chronic obstructive pulmonary disease) (HCC)    DDD (degenerative disc disease)    Diabetic retinopathy of both eyes (HCC)    Essential hypertension    GERD (gastroesophageal reflux disease)    History of atrial flutter 02/2011   Converted to NSR with Cardizem   History of chronic bronchitis    History of hemolytic anemia 02/2011   secondary to Avelox   HOH (hard of hearing)    HOH (hard of hearing)    no eardrum and nerve damage on R, also HOH on L   Mitral valve prolapse    a. 2D Echo 11/27/14: EF 55-60%; images were inadequate for LV wall motion assessment, + mild late systolic mitral valve prolapse involving the anterior leaflet.   PAD (peripheral artery disease) ([place]) 04/2014   Dr Trula Slade; bilateral SFA occlusion, R mid, L distal   PAF (paroxysmal atrial fibrillation) ([place])    a. Dx 11/2014 during admission for perf ulcer.   Perforated ulcer ([place])    a. 11/2014 s/p surgery.   Productive cough    Smokers' cough ([place])    Type 1 diabetes mellitus ([place]) 1977   Past Surgical History:  Procedure Laterality Date   CARDIOVERSION N/A 01/20/2021   Procedure: CARDIOVERSION;  Surgeon: Fay Records, MD;  Location: [facility];  Service: Cardiovascular;  Laterality: N/A;   CYSTOSCOPY WITH URETEROSCOPY Right 08/14/2013   Procedure: CYSTOSCOPY WITH URETEROSCOPY BLADDER BIOPSY ;  Surgeon: Claybon Jabs, MD;  Location[facility];  Service: Urology;  Laterality: Right;   ESOPHAGOGASTRODUODENOSCOPY N/A 02/06/2013   Procedure: ESOPHAGOGASTRODUODENOSCOPY (EGD);  Surgeon: Lafayette Dragon, MD;  Location: [facility];  Service: Endoscopy;  Laterality: N/A;   LAPAROSCOPY N/A 11/25/2014   Procedure: LAPAROSCOPIC PRIMARY REPAIR OF PERFORATED PREPYLORIC ULCER WITH Silvestre Gunner;  Surgeon: Greer Pickerel, MD;  Location: [facility];  Service: General;  Laterality: N/A;   TONSILLECTOMY  as child   TRANSTHORACIC ECHOCARDIOGRAM  02-17-2011   MODERATE LVH/  EF 65%   TRANSURETHRAL RESECTION OF BLADDER TUMOR WITH GYRUS (TURBT-GYRUS) N/A 06/12/2013   Procedure: TRANSURETHRAL RESECTION OF BLADDER TUMOR WITH GYRUS (TURBT-GYRUS);  Surgeon: Claybon Jabs, MD;  Location: [facility];  Service: Urology;  Laterality: N/A;   TYMPANIC MEMBRANE REPAIR  as child   Patient Active Problem List   Diagnosis Date Noted   Chronic pain syndrome 12/22/2021   Tobacco use 12/17/2021   Other intervertebral disc degeneration, lumbar region 74/16/3845   Acute metabolic encephalopathy 36/46/8032   ETOH abuse 11/28/2021   Hypocalcemia 11/27/2021   Nodule of left lung 08/18/2021   Demand ischemia 08/17/2021   Sepsis ([place]) 08/16/2021   Hypoglycemia    Persistent atrial fibrillation/flutter with rapid ventricular response ([place]) 07/16/2021   AKI (acute  kidney injury) ([place]) 07/16/2021   Chronic combined systolic and diastolic CHF (congestive heart failure) ([place]) 07/16/2021   Hyponatremia 07/16/2021   Secondary hypercoagulable state ([place]) 12/25/2020   Alcohol-induced chronic pancreatitis ([place]) 12/17/2020   Delirium tremens ([place]) 12/17/2020   COPD with chronic bronchitis and emphysema ([place])    Hearing loss 07/26/2019   Chronic back pain 05/02/2019   Mild renal insufficiency 08/25/2017   Prepyloric ulcer 12/12/2014   Essential hypertension 12/07/2014   Mitral valve prolapse 12/06/2014   Atrial fibrillation with RVR ([place])  11/26/2014   GERD (gastroesophageal reflux disease) 02/06/2013   Insulin dependent type 2 diabetes mellitus ([place]) 02/04/2013   Uncontrolled type 1 diabetes mellitus with hypoglycemia, with long-term current use of insulin ([place]) 02/16/2011    PCP: Chevis Pretty, FNP  REFERRING PROVIDER: Gertie Gowda, DO  REFERRING DIAG: Chronic bilateral low back pain without sciatica  Rationale for Evaluation and Treatment Rehabilitation  THERAPY DIAG:  Other low back pain  ONSET DATE: 15 years ago  SUBJECTIVE:                                                                                                                                                                                           SUBJECTIVE STATEMENT: LB pain is  about the same. Still Doing exercises at home. No change in pain with ADL's. Now my hips are hurting. PERTINENT HISTORY:  Patient is hard of hearing, a-fib, HTN, COPD, DM, CHF, PAD, OA, history of cancer, and DDD  PAIN:  Are you having pain? Yes: NPRS scale: 8/10 Pain location: low back Pain description: "it just hurts"  Aggravating factors: bending over, riding his tractor Relieving factors: laying supine   PRECAUTIONS: None  WEIGHT BEARING RESTRICTIONS No  FALLS:  Has patient fallen in last 6 months? No  LIVING ENVIRONMENT: Lives with: lives with their family Lives in: House/apartment Stairs: Yes: Internal: 1 steps; none and External: 4-5 steps; on right going up  OCCUPATION: farmer; supervises workers   PLOF: Independent  PATIENT GOALS go to pain management (requires PT prior pain management)    OBJECTIVE:  TODAY'S TREATMENT    12-25-21                                     EXERCISE LOG  Exercise Repetitions and Resistance Comments  Nustep  L4 x 2 mins stopped  due to RT hip pain   Hooklying AB bracing X10 hold 5 secs   Hooklying leg raise X6 hold 5 secs each LE   Double knee raise X6 hold  10 secs   Bridge  X 10 hold 3-5 secs   SKTC  X5 hold 10-15 secs each LE    Blank cell = exercise not performed today  Reviewed Above HEP and Body mechanics  PATIENT EDUCATION:  Education details: POC, healing, pain management, prognosis Person educated: Patient Education method: Explanation Education comprehension: verbalized understanding   HOME EXERCISE PROGRAM: Pt has handout   ASSESSMENT:  CLINICAL IMPRESSION: Pt arrived today doing about the same as far as LBP and reports performing exs at home. Pt reports having Bil. Hip pain today with RT>LT. Rx focused on HEP and postures to increase core stability and decrease spinal pressures. Pt reports the only real relief he gets is when he lays down. HEP LTG MET, but not others due to high pain levels.    OBJECTIVE IMPAIRMENTS Abnormal gait, decreased activity tolerance, decreased mobility, difficulty walking, decreased ROM, decreased strength, hypomobility, impaired flexibility, impaired tone, postural dysfunction, and pain.   ACTIVITY LIMITATIONS carrying, lifting, bending, standing, squatting, stairs, transfers, and locomotion level  PARTICIPATION LIMITATIONS: shopping, community activity, occupation, and yard work  PERSONAL FACTORS Behavior pattern, Profession, Time since onset of injury/illness/exacerbation, and 3+ comorbidities: Patient is hard of hearing, a-fib, HTN, COPD, DM, CHF, PAD, OA, history of cancer, and DDD  are also affecting patient's functional outcome.   REHAB POTENTIAL: Fair    CLINICAL DECISION MAKING: Evolving/moderate complexity  EVALUATION COMPLEXITY: Moderate   GOALS: Goals reviewed with patient? No  LONG TERM GOALS: Target date: 12/22/2021  Patient will be independent with his HEP.  Baseline:  Goal status: MET  2.  Patient will be able to complete his daily activities without his familiar low back pain exceeding 7/10.  Baseline:  Goal status: NOT MET due to pain 8-9/10   3.  Patient will be able to pick up at least 5 pounds from the  floor without being limited by his familiar back pain.  Baseline:  Goal status: NOT MET due to pain  PLAN: PT FREQUENCY: 2x/week  PT DURATION: 3 weeks  PLANNED INTERVENTIONS: Therapeutic exercises, Therapeutic activity, Neuromuscular re-education, Balance training, Gait training, Patient/Family education, Self Care, Joint mobilization, Stair training, Electrical stimulation, Spinal mobilization, Cryotherapy, Moist heat, Traction, Ultrasound, Manual therapy, and Re-evaluation.  PLAN FOR NEXT SESSION:  DC to HEP   Kellie Murrill,CHRIS, PTA 12/25/2021, 5:15 PM   PHYSICAL THERAPY DISCHARGE SUMMARY  Visits from Start of Care: 6  Current functional level related to goals / functional outcomes: Patient was able to meet his goal for independence with his HEP. However, he was unable to meet his other goals for therapy due to his low back pain.    Remaining deficits: Low back pain    Education / Equipment: HEP    Patient agrees to discharge. Patient goals were partially met. Patient is being discharged due to lack of progress.  Jacqulynn Cadet, PT, DPT

## 2021-12-26 ENCOUNTER — Ambulatory Visit (HOSPITAL_COMMUNITY)
Admission: RE | Admit: 2021-12-26 | Discharge: 2021-12-26 | Disposition: A | Discharge status: Home / Self Care | Payer: BC Managed Care – PPO | Source: Ambulatory Visit | Attending: Physician Assistant | Admitting: Physician Assistant

## 2021-12-26 ENCOUNTER — Encounter (HOSPITAL_COMMUNITY): Payer: Self-pay | Admitting: Physician Assistant

## 2021-12-26 VITALS — BP 142/68 | HR 63 | Ht 69.0 in | Wt 184.6 lb

## 2021-12-26 DIAGNOSIS — E108 Type 1 diabetes mellitus with unspecified complications: Secondary | ICD-10-CM | POA: Diagnosis not present

## 2021-12-26 DIAGNOSIS — I4819 Other persistent atrial fibrillation: Secondary | ICD-10-CM | POA: Diagnosis not present

## 2021-12-26 DIAGNOSIS — I110 Hypertensive heart disease with heart failure: Secondary | ICD-10-CM | POA: Insufficient documentation

## 2021-12-26 DIAGNOSIS — F17210 Nicotine dependence, cigarettes, uncomplicated: Secondary | ICD-10-CM | POA: Insufficient documentation

## 2021-12-26 DIAGNOSIS — K219 Gastro-esophageal reflux disease without esophagitis: Secondary | ICD-10-CM | POA: Insufficient documentation

## 2021-12-26 DIAGNOSIS — J449 Chronic obstructive pulmonary disease, unspecified: Secondary | ICD-10-CM | POA: Diagnosis not present

## 2021-12-26 DIAGNOSIS — D6869 Other thrombophilia: Secondary | ICD-10-CM | POA: Diagnosis not present

## 2021-12-26 DIAGNOSIS — Z7901 Long term (current) use of anticoagulants: Secondary | ICD-10-CM | POA: Insufficient documentation

## 2021-12-26 DIAGNOSIS — I5042 Chronic combined systolic (congestive) and diastolic (congestive) heart failure: Secondary | ICD-10-CM | POA: Insufficient documentation

## 2021-12-26 DIAGNOSIS — I4892 Unspecified atrial flutter: Secondary | ICD-10-CM | POA: Diagnosis not present

## 2021-12-26 NOTE — Progress Notes (Signed)
Primary Care Physician: Chevis Pretty, FNP Primary Cardiologist: Dr Gardiner Rhyme Primary Electrophysiologist: Dr Quentin Ore Referring Physician: Darla Lesches NP   Thomas Sandoval. is a 66 y.o. male with a history of alcohol abuse, tobacco abuse, COPD, MVP, perforated gastric ulcer s/p repair 2016, DM, HTN, PAD (bilateral fem-pop occlusion), GERD, systolic CHF, atrial flutter, atrial fibrillation who presents for follow up in the [facility]. The patient was initially diagnosed with atrial flutter in 2012 and afib in the setting of his perforated ulcer. Patient has a CHADS2VASC score of 4, has declined anticoagulation in the past. He was hospitalized 9/26-9/30/22 with acute alcoholic pancreatitis and was also found to be in afib at the time. Echo showed EF 30-35%. He left the hospital AMA. At his visit with his PCP 12/17/20 he was still in afib and was started on diltiazem and Xarelto. Patient is s/p DCCV on 01/20/21 but unfortunately was back in afib by follow up on 01/24/21. He did not feel any different in SR.   Patient seen by Dr Quentin Ore 03/26/21 and discussed ablation but patient deferred at that time. He was admitted 07/2021 for community acquired pneumonia and rapid atrial flutter. He remained in atrial flutter at his visit with Dr Gardiner Rhyme on 08/20/21.  Patient was admitted 11/2021 after being found unresponsive by family. They described possible seizure-like activity. It is unclear whether he had witnessed syncope.  There is no report of prodromal chest pain or shortness of breath.  On EMS arrival he was unresponsive, was hypoglycemic (fingerstick blood sugar 48) and severely hypotensive (60/40 mmHg).  He received a liter of fluid, Narcan (without improvement) and intravenous dextrose.  His glucose increased to over 200 but he remained unresponsive.  His ECG shows diffuse ST segment depression in all leads except aVR where there is mild ST segment elevation.  Symptoms felt to be related to hypoglycemia. Ischemic workup was not pursued at that time. He was in afib but converted to SR prior to discharge.   On follow up today, patient is in [place] and reports that he feels well today. He will occasionally have right foot swelling and take an extra lasix. No edema today. No bleeding issues on anticoagulation.   Today, he denies symptoms of palpitations, chest pain, orthopnea, PND, lower extremity edema, dizziness, presyncope, syncope, snoring, daytime somnolence, bleeding, or neurologic sequela. The patient is tolerating medications without difficulties and is otherwise without complaint today.    Atrial Fibrillation Risk Factors:  he does not have symptoms or diagnosis of sleep apnea. he does not have a history of rheumatic fever. he does have a history of alcohol use. The patient does not have a history of early familial atrial fibrillation or other arrhythmias.  he has a BMI of Body mass index is 27.26 kg/m.Marland Kitchen Filed Weights   12/26/21 1111  Weight: 83.7 kg    Family History  Problem Relation Age of Onset   Breast cancer Mother    Cancer Mother        Breast   Rheumatic fever Father    Heart disease Father    Heart attack Father        Massive    Diabetes Son      Atrial Fibrillation Management history:  Previous antiarrhythmic drugs: none Previous cardioversions: 08/25/17, 01/20/21 Previous ablations: none CHADS2VASC score: 4 Anticoagulation history: Xarelto    Past Medical History:  Diagnosis Date   Arthritis    Bladder cancer ([place])    Chronic  back pain    Chronic combined systolic and diastolic CHF (congestive heart failure) ([place]) 07/16/2021   COPD (chronic obstructive pulmonary disease) (HCC)    DDD (degenerative disc disease)    Diabetic retinopathy of both eyes (HCC)    Essential hypertension    GERD (gastroesophageal reflux disease)    History of atrial flutter 02/2011   Converted to NSR with Cardizem   History of  chronic bronchitis    History of hemolytic anemia 02/2011   secondary to Avelox   HOH (hard of hearing)    HOH (hard of hearing)    no eardrum and nerve damage on R, also HOH on L   Mitral valve prolapse    a. 2D Echo 11/27/14: EF 55-60%; images were inadequate for LV wall motion assessment, + mild late systolic mitral valve prolapse involving the anterior leaflet.   PAD (peripheral artery disease) ([place]) 04/2014   Dr Trula Slade; bilateral SFA occlusion, R mid, L distal   PAF (paroxysmal atrial fibrillation) ([place])    a. Dx 11/2014 during admission for perf ulcer.   Perforated ulcer ([place])    a. 11/2014 s/p surgery.   Productive cough    Smokers' cough ([place])    Type 1 diabetes mellitus ([place]) 1977   Past Surgical History:  Procedure Laterality Date   CARDIOVERSION N/A 01/20/2021   Procedure: CARDIOVERSION;  Surgeon: Fay Records, MD;  Location: [facility] [place];  Service: Cardiovascular;  Laterality: N/A;   CYSTOSCOPY WITH URETEROSCOPY Right 08/14/2013   Procedure: CYSTOSCOPY WITH URETEROSCOPY BLADDER BIOPSY ;  Surgeon: Claybon Jabs, MD;  Location[facility];  Service: Urology;  Laterality: Right;   ESOPHAGOGASTRODUODENOSCOPY N/A 02/06/2013   Procedure: ESOPHAGOGASTRODUODENOSCOPY (EGD);  Surgeon: Lafayette Dragon, MD;  Location: [facility] ENDOSCOPY;  Service: Endoscopy;  Laterality: N/A;   LAPAROSCOPY N/A 11/25/2014   Procedure: LAPAROSCOPIC PRIMARY REPAIR OF PERFORATED PREPYLORIC ULCER WITH Silvestre Gunner;  Surgeon: Greer Pickerel, MD;  Location: [facility];  Service: General;  Laterality: N/A;   TONSILLECTOMY  as child   TRANSTHORACIC ECHOCARDIOGRAM  02-17-2011   MODERATE LVH/  EF 65%   TRANSURETHRAL RESECTION OF BLADDER TUMOR WITH GYRUS (TURBT-GYRUS) N/A 06/12/2013   Procedure: TRANSURETHRAL RESECTION OF BLADDER TUMOR WITH GYRUS (TURBT-GYRUS);  Surgeon: Claybon Jabs, MD;  Location: [facility];  Service: Urology;  Laterality: N/A;   TYMPANIC MEMBRANE REPAIR  as child    Current  Outpatient Medications  Medication Sig Dispense Refill   albuterol (VENTOLIN HFA) 108 (90 Base) MCG/ACT inhaler Inhale 2 puffs into the lungs every 6 (six) hours as needed for wheezing or shortness of breath. 18 g 2   amoxicillin-clavulanate (AUGMENTIN) 875-125 MG tablet Take 1 tablet by mouth 2 (two) times daily. 28 tablet 0   cetirizine (ZYRTEC) 10 MG tablet Take 10 mg by mouth daily.     empagliflozin (JARDIANCE) 10 MG TABS tablet Take 1 tablet (10 mg total) by mouth daily before breakfast. 30 tablet 11   famotidine (PEPCID) 20 MG tablet Take 1 tablet (20 mg total) by mouth every morning. Reported on 04/16/2015 30 tablet 5   furosemide (LASIX) 40 MG tablet Take 0.5 tablets (20 mg total) by mouth daily. 90 tablet 3   HYDROcodone-acetaminophen (NORCO/VICODIN) 5-325 MG tablet Take 1 tablet by mouth 3 (three) times daily as needed for severe pain. 90 tablet 0   insulin glargine (LANTUS) 100 UNIT/ML injection Inject 0.2 mLs (20 Units total) into the skin daily. 30 mL 5   Insulin Syringe-Needle  U-100 (B-D INS SYR ULTRAFINE 1CC/30G) 30G X 1/2" 1 ML MISC Use 4 times a day with insulin Dx E11.9 400 each 3   Insulin Syringes, Disposable, U-100 1 ML MISC Use 4 times a day for insulin injection Dx E11.9 400 each 5   ipratropium-albuterol (DUONEB) 0.5-2.5 (3) MG/3ML SOLN TAKE 3 MLS BY NEBULIZATION EVERY 4 (FOUR) HOURS AS NEEDED. 360 mL 0   levalbuterol (XOPENEX) 0.31 MG/3ML nebulizer solution Take 3 mLs (0.31 mg total) by nebulization every 4 (four) hours as needed for wheezing. 3 mL 12   losartan (COZAAR) 50 MG tablet Take 1 tablet (50 mg total) by mouth at bedtime. hold this medication until follow up     metoprolol succinate (TOPROL-XL) 100 MG 24 hr tablet Take 1 tablet (100 mg total) by mouth daily. Take with or immediately following a meal. 90 tablet 3   naloxone (NARCAN) nasal spray 4 mg/0.1 mL Place 1 spray into the nose as needed (For opiate overdose). 1 each 2   NOVOLOG 100 UNIT/ML injection PER  SLIDING SCALE: 190 - 200 = 2 UNITS. 300 AND ABOVE = 7 UNITS. 10 mL 1   OVER THE COUNTER MEDICATION Take 1 Scoop by mouth in the morning and at bedtime. Super Beets     rivaroxaban (XARELTO) 20 MG TABS tablet Take 1 tablet (20 mg total) by mouth daily with supper. 30 tablet 5   TRELEGY ELLIPTA 100-62.5-25 MCG/ACT AEPB Inhale 1 puff into the lungs daily.     No current facility-administered medications for this encounter.    Allergies  Allergen Reactions   Azithromycin Other (See Comments) and Nausea And Vomiting    "Severe stomach cramps; told to list as an allergy by dr. Huel Cote ago"   Avelox [Moxifloxacin Hcl In Nacl] Other (See Comments)    Hemolysis  In 2012   Bactrim [Sulfamethoxazole-Trimethoprim] Diarrhea and Nausea And Vomiting   Moxifloxacin Other (See Comments)   Sulfamethoxazole-Trimethoprim Other (See Comments)    Social History   Socioeconomic History   Marital status: Married    Spouse name: Not on file   Number of children: Not on file   Years of education: Not on file   Highest education level: Not on file  Occupational History   Occupation: Tobacco Farmer  Tobacco Use   Smoking status: Every Day    Packs/day: 2.00    Years: 30.00    Total pack years: 60.00    Types: Cigarettes   Smokeless tobacco: Former    Quit date: 06/08/1978   Tobacco comments:     1 packs per day ARJ 12/17/21  Vaping Use   Vaping Use: Never used  Substance and Sexual Activity   Alcohol use: Yes    Comment: 5 quarts per week 12/26/21   Drug use: No   Sexual activity: Not on file  Other Topics Concern   Not on file  Social History Narrative   Lives in [place] with wife and 2 sons.    Social Determinants of Health   Financial Resource Strain: Not on file  Food Insecurity: Unknown (11/27/2021)   Hunger Vital Sign    Worried About Running Out of Food in the Last Year: Patient refused    [place] in the Last Year: Patient refused  Transportation Needs: Unknown (11/27/2021)    PRAPARE - Hydrologist (Medical): Patient refused    Lack of Transportation (Non-Medical): Patient refused  Physical Activity: Not on file  Stress: Not  on file  Social Connections: Not on file  Intimate Partner Violence: Unknown (11/27/2021)   Humiliation, Afraid, Rape, and Kick questionnaire    Fear of Current or Ex-Partner: Patient refused    Emotionally Abused: Patient refused    Physically Abused: Patient refused    Sexually Abused: Patient refused     ROS- All systems are reviewed and negative except as per the HPI above.  Physical Exam: Vitals:   12/26/21 1111  BP: (!) 142/68  Pulse: 63  Weight: 83.7 kg  Height: '5\' 9"'$  (1.753 m)    GEN- The patient is a well appearing male, alert and oriented x 3 today.   HEENT-head normocephalic, atraumatic, sclera clear, conjunctiva pink, hearing intact, trachea midline. Lungs- Clear to ausculation bilaterally, normal work of breathing Heart- Regular rate and rhythm, no murmurs, rubs or gallops  GI- soft, NT, ND, + BS Extremities- no clubbing, cyanosis, or edema MS- no significant deformity or atrophy Skin- no rash or lesion Psych- euthymic mood, full affect Neuro- strength and sensation are intact   Wt Readings from Last 3 Encounters:  12/26/21 83.7 kg  12/22/21 81.6 kg  12/17/21 83.3 kg    EKG today demonstrates  SR Vent. rate 63 BPM PR interval 150 ms QRS duration 82 ms QT/QTcB 408/417 ms  Echo 05/21/21 demonstrated   1. Left ventricular ejection fraction, by estimation, is 55 to 60%. The  left ventricle has normal function. The left ventricle has no regional  wall motion abnormalities. Left ventricular diastolic parameters are  consistent with Grade II diastolic dysfunction (pseudonormalization). Elevated left ventricular end-diastolic pressure.   2. Right ventricular systolic function is normal. The right ventricular  size is normal. There is normal pulmonary artery systolic pressure.    3. The mitral valve is normal in structure. No evidence of mitral valve  regurgitation. No evidence of mitral stenosis.   4. The aortic valve is tricuspid. Aortic valve regurgitation is not  visualized. No aortic stenosis is present.   5. The inferior vena cava is normal in size with greater than 50%  respiratory variability, suggesting right atrial pressure of 3 mmHg.   Epic records are reviewed at length today  CHA2DS2-VASc Score = 4  The patient's score is based upon: CHF History: 1 HTN History: 1 Diabetes History: 1 Stroke History: 0 Vascular Disease History: 1 Age Score: 0 Gender Score: 0        ASSESSMENT AND PLAN: 1. Persistent Atrial Fibrillation/atrial flutter The patient's CHA2DS2-VASc score is 4, indicating a 4.8% annual risk of stroke.   Patient in [place] today.  We discussed rhythm control options including dofetilide admission. He is not interested in pursuing dofetilide at this time.  Continue Toprol 100 mg daily Continue Xarelto 20 mg daily  2. Secondary Hypercoagulable State (ICD10:  D68.69) The patient is at significant risk for stroke/thromboembolism based upon his CHA2DS2-VASc Score of 4.  Continue Rivaroxaban (Xarelto).   3. HFrEF EF recovered to 55-60% Fluid status appears stable today.  4. HTN Stable, no changes today.   Follow up with Dr Gardiner Rhyme as scheduled. AF clinic as needed.    [facility] [address] [place], [place] [address] [phone] 12/26/2021 11:18 AM

## 2022-01-05 NOTE — Progress Notes (Unsigned)
Cardiology Office Note:    Date:  01/06/2022   ID:  Thomas Medici., DOB 12/16/1955, MRN [id number]  PCP:  Chevis Pretty, FNP  Cardiologist:  Donato Heinz, MD  Electrophysiologist:  Vickie Epley, MD   Referring MD: Chevis Pretty, *   Chief Complaint  Patient presents with   Atrial Fibrillation    History of Present Illness:    Thomas Lipinski. is a 66 y.o. male with a hx of alcohol abuse, tobacco abuse, COPD, perforated gastric ulcer status postrepair in 2016, T2DM, hypertension, PAD, MVP, atrial fibrillation/flutter, systolic heart failure who presents for follow-up.  He was hospitalized September 2022 with acute pancreatitis found to be in A. fib.  Echo at that time showed EF 30 to 35%.  He left AMA.  At follow-up with his PCP 12/17/2020 he was still in A. fib and started on Xarelto and diltiazem.  He was referred to A. fib clinic, seen on 12/25/2020.  He underwent cardioversion on 01/20/2021 with successful restoration of sinus rhythm.  Echocardiogram 08/26/2017 showed EF 55 to 60%, mild mitral valve prolapse.  Echo 12/05/2020 showed EF 30 to 35%, global hypokinesis, and low normal RV function, moderate left atrial enlargement, trivial MR.  Zio patch x3 days on 02/10/2021 showed high percent A. fib burden with average rate 95 bpm.  Echocardiogram 05/21/2021 showed EF 55 to 32%, grade 2 diastolic dysfunction, normal RV function, no significant valvular disease.  He was admitted 11/2021 after being found unresponsive with significant hypoglycemia and hypotension.  EKG with aVR elevation and diffuse ST depressions, suggesting global ischemia.  EKG normalized with correction of his hypoglycemia.  He was seen by cardiology and outpatient coronary CT was recommended.  Since last clinic visit, he reports that he has been doing okay.  Denies any chest pain but does report shortness of breath.  Denies any palpitations, lightheadedness, or syncope.  Does report  lower extremity edema.  He has been smoking over 1 pack/day.  Also drinking 5-6 swallows of vodka per day.  Taking Xarelto, denies any bleeding issues.   Wt Readings from Last 3 Encounters:  01/06/22 187 lb 12.8 oz (85.2 kg)  12/26/21 184 lb 9.6 oz (83.7 kg)  12/22/21 180 lb (81.6 kg)     Past Medical History:  Diagnosis Date   Arthritis    Bladder cancer ([place])    Chronic back pain    Chronic combined systolic and diastolic CHF (congestive heart failure) ([place]) 07/16/2021   COPD (chronic obstructive pulmonary disease) (HCC)    DDD (degenerative disc disease)    Diabetic retinopathy of both eyes (HCC)    Essential hypertension    GERD (gastroesophageal reflux disease)    History of atrial flutter 02/2011   Converted to NSR with Cardizem   History of chronic bronchitis    History of hemolytic anemia 02/2011   secondary to Avelox   HOH (hard of hearing)    HOH (hard of hearing)    no eardrum and nerve damage on R, also HOH on L   Mitral valve prolapse    a. 2D Echo 11/27/14: EF 55-60%; images were inadequate for LV wall motion assessment, + mild late systolic mitral valve prolapse involving the anterior leaflet.   PAD (peripheral artery disease) ([place]) 04/2014   Dr Trula Slade; bilateral SFA occlusion, R mid, L distal   PAF (paroxysmal atrial fibrillation) ([place])    a. Dx 11/2014 during admission for perf ulcer.   Perforated ulcer ([place])  a. 11/2014 s/p surgery.   Productive cough    Smokers' cough ([place])    Type 1 diabetes mellitus ([place]) 1977    Past Surgical History:  Procedure Laterality Date   CARDIOVERSION N/A 01/20/2021   Procedure: CARDIOVERSION;  Surgeon: Fay Records, MD;  Location: [place];  Service: Cardiovascular;  Laterality: N/A;   CYSTOSCOPY WITH URETEROSCOPY Right 08/14/2013   Procedure: CYSTOSCOPY WITH URETEROSCOPY BLADDER BIOPSY ;  Surgeon: Claybon Jabs, MD;  Location: [facility];  Service: Urology;  Laterality: Right;    ESOPHAGOGASTRODUODENOSCOPY N/A 02/06/2013   Procedure: ESOPHAGOGASTRODUODENOSCOPY (EGD);  Surgeon: Lafayette Dragon, MD;  Location: [facility] ENDOSCOPY;  Service: Endoscopy;  Laterality: N/A;   LAPAROSCOPY N/A 11/25/2014   Procedure: LAPAROSCOPIC PRIMARY REPAIR OF PERFORATED PREPYLORIC ULCER WITH Silvestre Gunner;  Surgeon: Greer Pickerel, MD;  Location: [place];  Service: General;  Laterality: N/A;   TONSILLECTOMY  as child   TRANSTHORACIC ECHOCARDIOGRAM  02-17-2011   MODERATE LVH/  EF 65%   TRANSURETHRAL RESECTION OF BLADDER TUMOR WITH GYRUS (TURBT-GYRUS) N/A 06/12/2013   Procedure: TRANSURETHRAL RESECTION OF BLADDER TUMOR WITH GYRUS (TURBT-GYRUS);  Surgeon: Claybon Jabs, MD;  Location: [facility];  Service: Urology;  Laterality: N/A;   TYMPANIC MEMBRANE REPAIR  as child    Current Medications: Current Meds  Medication Sig   albuterol (VENTOLIN HFA) 108 (90 Base) MCG/ACT inhaler Inhale 2 puffs into the lungs every 6 (six) hours as needed for wheezing or shortness of breath.   cetirizine (ZYRTEC) 10 MG tablet Take 10 mg by mouth daily.   empagliflozin (JARDIANCE) 10 MG TABS tablet Take 1 tablet (10 mg total) by mouth daily before breakfast.   famotidine (PEPCID) 20 MG tablet Take 1 tablet (20 mg total) by mouth every morning. Reported on 04/16/2015   furosemide (LASIX) 40 MG tablet Take 1 tablet (40 mg total) by mouth daily.   HYDROcodone-acetaminophen (NORCO/VICODIN) 5-325 MG tablet Take 1 tablet by mouth 3 (three) times daily as needed for severe pain.   insulin glargine (LANTUS) 100 UNIT/ML injection Inject 0.2 mLs (20 Units total) into the skin daily.   Insulin Syringe-Needle U-100 (B-D INS SYR ULTRAFINE 1CC/30G) 30G X 1/2" 1 ML MISC Use 4 times a day with insulin Dx E11.9   Insulin Syringes, Disposable, U-100 1 ML MISC Use 4 times a day for insulin injection Dx E11.9   ipratropium-albuterol (DUONEB) 0.5-2.5 (3) MG/3ML SOLN TAKE 3 MLS BY NEBULIZATION EVERY 4 (FOUR) HOURS AS NEEDED.    levalbuterol (XOPENEX) 0.31 MG/3ML nebulizer solution Take 3 mLs (0.31 mg total) by nebulization every 4 (four) hours as needed for wheezing.   losartan (COZAAR) 50 MG tablet Take 1 tablet (50 mg total) by mouth at bedtime. hold this medication until follow up   metoprolol succinate (TOPROL-XL) 100 MG 24 hr tablet Take 1 tablet (100 mg total) by mouth daily. Take with or immediately following a meal.   naloxone (NARCAN) nasal spray 4 mg/0.1 mL Place 1 spray into the nose as needed (For opiate overdose).   NOVOLOG 100 UNIT/ML injection PER SLIDING SCALE: 190 - 200 = 2 UNITS. 300 AND ABOVE = 7 UNITS.   OVER THE COUNTER MEDICATION Take 1 Scoop by mouth in the morning and at bedtime. Super Beets   rivaroxaban (XARELTO) 20 MG TABS tablet Take 1 tablet (20 mg total) by mouth daily with supper.   TRELEGY ELLIPTA 100-62.5-25 MCG/ACT AEPB Inhale 1 puff into the lungs daily.   [DISCONTINUED] furosemide (LASIX)  40 MG tablet Take 0.5 tablets (20 mg total) by mouth daily.     Allergies:   Azithromycin, Avelox [moxifloxacin hcl in nacl], Bactrim [sulfamethoxazole-trimethoprim], Moxifloxacin, and Sulfamethoxazole-trimethoprim   Social History   Socioeconomic History   Marital status: Married    Spouse name: Not on file   Number of children: Not on file   Years of education: Not on file   Highest education level: Not on file  Occupational History   Occupation: Tobacco Farmer  Tobacco Use   Smoking status: Every Day    Packs/day: 2.00    Years: 30.00    Total pack years: 60.00    Types: Cigarettes   Smokeless tobacco: Former    Quit date: 06/08/1978   Tobacco comments:     1 packs per day ARJ 12/17/21  Vaping Use   Vaping Use: Never used  Substance and Sexual Activity   Alcohol use: Yes    Comment: 5 quarts per week 12/26/21   Drug use: No   Sexual activity: Not on file  Other Topics Concern   Not on file  Social History Narrative   Lives in [place] with wife and 2 sons.    Social  Determinants of Health   Financial Resource Strain: Not on file  Food Insecurity: Unknown (11/27/2021)   Hunger Vital Sign    Worried About Running Out of Food in the Last Year: Patient refused    Ran Out of Food in the Last Year: Patient refused  Transportation Needs: Unknown (11/27/2021)   PRAPARE - Hydrologist (Medical): Patient refused    Lack of Transportation (Non-Medical): Patient refused  Physical Activity: Not on file  Stress: Not on file  Social Connections: Not on file     Family History: The patient's family history includes Breast cancer in his mother; Cancer in his mother; Diabetes in his son; Heart attack in his father; Heart disease in his father; Rheumatic fever in his father.  ROS:   Please see the history of present illness.     All other systems reviewed and are negative.  EKGs/Labs/Other Studies Reviewed:    The following studies were reviewed today:   EKG:   11/19/2021: Normal sinus rhythm, rate 72, left axis deviation 08/20/2021: Atrial flutter with variable conduction, rate 87 05/20/21: Normal sinus rhythm, rate 61, left axis deviation, nonspecific T wave flattening, Q waves in leads III, aVF 03/14/21:NSR, ST depression<1 mm in inferior leads and V4-6, rate 61  Recent Labs: 07/16/2021: B Natriuretic Peptide 950.1 08/17/2021: TSH 0.322 11/27/2021: ALT 12; Hemoglobin 15.0; Magnesium 1.9; Platelets 212 11/28/2021: BUN 12; Creatinine, Ser 1.19; Potassium 4.2; Sodium 135  Recent Lipid Panel    Component Value Date/Time   CHOL 111 10/30/2021 1532   TRIG 162 (H) 10/30/2021 1532   HDL 39 (L) 10/30/2021 1532   CHOLHDL 2.8 10/30/2021 1532   LDLCALC 45 10/30/2021 1532    Physical Exam:    VS:  BP (!) 130/58   Pulse 79   Ht '5\' 9"'$  (1.753 m)   Wt 187 lb 12.8 oz (85.2 kg)   SpO2 94%   BMI 27.73 kg/m     Wt Readings from Last 3 Encounters:  01/06/22 187 lb 12.8 oz (85.2 kg)  12/26/21 184 lb 9.6 oz (83.7 kg)  12/22/21 180 lb (81.6  kg)     GEN:  Well nourished, well developed in no acute distress HEENT: Normal NECK: No JVD; No carotid bruits CARDIAC: RRR, no murmurs,  rubs, gallops RESPIRATORY:  Clear to auscultation without rales, wheezing or rhonchi  ABDOMEN: Soft, non-tender, non-distended MUSCULOSKELETAL: 1+ edema; No deformity  SKIN: Warm and dry NEUROLOGIC:  Alert and oriented x 3 PSYCHIATRIC:  Normal affect   ASSESSMENT:    1. SOB (shortness of breath)   2. Chronic combined systolic and diastolic heart failure (HCC)   3. Persistent atrial fibrillation ([place])   4. Nonspecific abnormal electrocardiogram (ECG) (EKG)   5. ETOH abuse   6. Tobacco use   7. Essential hypertension   8. PAD (peripheral artery disease) (HCC)       PLAN:    Persistent atrial fibrillation/flutter: CHA2DS2-VASc score 4.  Underwent successful cardioversion on 01/20/2021, but was back in A. fib at follow-up appointment on 01/24/2021.  Zio patch x3 days on 02/10/2021 showed 100% percent A. fib burden with average rate 95 bpm.  Subsequently converted to sinus rhythm, but was in a flutter at clinic appointment 08/20/2021.  Currently in sinus rhythm -Continue Xarelto 20 mg daily -Continue Toprol-XL 100 mg daily -Given possible tachycardia induced cardiomyopathy, would recommend rhythm control strategy.  Referred to EP to consider ablation versus antiarrhythmic.  Follows with Dr Quentin Ore, had been considering Tikosyn load but holding off for now  Chronic combined systolic and diastolic heart failure: EF 30 to 35% on echocardiogram during admission with acute pancreatitis in September 2022.  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  Echocardiogram 05/21/2021 showed EF 55 to 60% -Weight up 10 pounds from prior clinic visit, mildly volume overloaded on exam.  Recommend increasing Lasix to 40 mg daily.  Check CMET, BNP -Continue Toprol-XL 100 mg daily -Continue losartan 50 mg daily -Continue Jardiance 10 mg daily  Abnormal EKG: He was  admitted 11/2021 after being found unresponsive with significant hypoglycemia and hypotension.  EKG with aVR elevation and diffuse ST depressions, suggesting global ischemia.  EKG normalized with correction of his hypoglycemia.  He was seen by cardiology and outpatient coronary CT was recommended.  He denies any chest pain but having dyspnea on exertion -Recommend coronary CTA to evaluate for obstructive CAD  Alcohol abuse: Counseled risk of alcohol abuse and cessation recommended.  He initially stopped drinking after admission in September 2022, but reports he has been drinking recently.  Cessation recommended.  Tobacco use: Patient counseled on the risk of tobacco use and cessation strongly encouraged  Hypertension: Continue Toprol-XL, losartan  T2DM: On insulin  PAD: ABIs 2019 are severely reduced (right 0.52, left 0.48).  Lower extremity duplex 04/2014 showed bilateral SFA occlusions.  Previously followed with vascular surgery but not seen since 2019.  Recheck ABIs and refer to [facility]   RTC in 6 weeks   Medication Adjustments/Labs and Tests Ordered: Current medicines are reviewed at length with the patient today.  Concerns regarding medicines are outlined above.  Orders Placed This Encounter  Procedures   CT CORONARY MORPH W/CTA COR W/SCORE W/CA W/CM &/OR WO/CM   Comprehensive metabolic panel   B Nat Peptide   Ambulatory referral to Vascular Surgery   VAS [place] ABI WITH/WO TBI    Meds ordered this encounter  Medications   furosemide (LASIX) 40 MG tablet    Sig: Take 1 tablet (40 mg total) by mouth daily.    Dispense:  90 tablet    Refill:  3    01/06/22 dose increased to 40 mg qd    Patient Instructions  Medication Instructions:  INCREASE Lasix to 40 mg daily   Labwork: CMET,BNP   Testing/Procedures: Your physician has requested  that you have an ankle brachial index (ABI). During this test an ultrasound and blood pressure cuff are used to evaluate the arteries that supply  the arms and legs with blood. Allow thirty minutes for this exam. There are no restrictions or special instructions.   Follow-Up: 6 weeks  Any Other Special Instructions Will Be Listed Below (If Applicable).  You have been referred to [facility] .They will call you to schedule an appointment.   If you need a refill on your cardiac medications before your next appointment, please call your pharmacy.     Your cardiac CT will be scheduled at one of the below locations:   [facility] [address] [place], [place] [address] [phone]  If scheduled at [facility], please arrive at the [facility] and Children's Entrance (Entrance C2) of [facility] 30 minutes prior to test start time. You can use the FREE valet parking offered at entrance C (encouraged to control the heart rate for the test)  Proceed to the [facility] Radiology Department (first floor) to check-in and test prep.  All radiology patients and guests should use entrance C2 at [facility], accessed from [facility], even though the hospital's physical address listed is [address].     Please follow these instructions carefully (unless otherwise directed):  Hold all erectile dysfunction medications at least 3 days (72 hrs) prior to test. (Ie viagra, cialis, sildenafil, tadalafil, etc) We will administer nitroglycerin during this exam.   On the Night Before the Test: Be sure to Drink plenty of water. Do not consume any caffeinated/decaffeinated beverages or chocolate 12 hours prior to your test. Do not take any antihistamines 12 hours prior to your test.  On the Day of the Test: Drink plenty of water until 1 hour prior to the test. Do not eat any food 1 hour prior to test. You may take your regular medications prior to the test.  Take metoprolol (YOUR TOPROL XL 100 mg) two hours prior to test per Dr.Bradleigh Sonnen HOLD  Furosemide/Hydrochlorothiazide morning of the test.   After the Test: Drink plenty of water. After receiving IV contrast, you may experience a mild flushed feeling. This is normal. On occasion, you may experience a mild rash up to 24 hours after the test. This is not dangerous. If this occurs, you can take Benadryl 25 mg and increase your fluid intake. If you experience trouble breathing, this can be serious. If it is severe call 911 IMMEDIATELY. If it is mild, please call our office. If you take any of these medications: Glipizide/Metformin, Avandament, Glucavance, please do not take 48 hours after completing test unless otherwise instructed.  We will call to schedule your test 2-4 weeks out understanding that some insurance companies will need an authorization prior to the service being performed.   For non-scheduling related questions, please contact the cardiac imaging nurse navigator should you have any questions/concerns: Marchia Bond, Cardiac Imaging Nurse Navigator Gordy Clement, Cardiac Imaging Nurse Navigator [facility] Direct Office Dial: [phone]   For scheduling needs, including cancellations and rescheduling, please call [place], [phone].     Signed, Donato Heinz, MD  01/06/2022 4:55 PM    [facility]

## 2022-01-06 ENCOUNTER — Encounter: Payer: Self-pay | Admitting: Cardiology

## 2022-01-06 ENCOUNTER — Ambulatory Visit: Payer: BC Managed Care – PPO | Attending: Physician Assistant | Admitting: Cardiology

## 2022-01-06 VITALS — BP 130/58 | HR 79 | Ht 69.0 in | Wt 187.8 lb

## 2022-01-06 DIAGNOSIS — R0602 Shortness of breath: Secondary | ICD-10-CM

## 2022-01-06 DIAGNOSIS — I4819 Other persistent atrial fibrillation: Secondary | ICD-10-CM | POA: Diagnosis not present

## 2022-01-06 DIAGNOSIS — F1010 Alcohol abuse, uncomplicated: Secondary | ICD-10-CM

## 2022-01-06 DIAGNOSIS — I739 Peripheral vascular disease, unspecified: Secondary | ICD-10-CM

## 2022-01-06 DIAGNOSIS — I5042 Chronic combined systolic (congestive) and diastolic (congestive) heart failure: Secondary | ICD-10-CM

## 2022-01-06 DIAGNOSIS — R9431 Abnormal electrocardiogram [ECG] [EKG]: Secondary | ICD-10-CM | POA: Diagnosis not present

## 2022-01-06 DIAGNOSIS — I10 Essential (primary) hypertension: Secondary | ICD-10-CM

## 2022-01-06 DIAGNOSIS — Z720 Tobacco use: Secondary | ICD-10-CM

## 2022-01-06 MED ORDER — FUROSEMIDE 40 MG PO TABS: 40.0000 mg | ORAL_TABLET | Freq: Every day | ORAL | 3 refills | Status: DC

## 2022-01-06 NOTE — Patient Instructions (Addendum)
Medication Instructions:  INCREASE Lasix to 40 mg daily   Labwork: CMET,BNP   Testing/Procedures: Your physician has requested that you have an ankle brachial index (ABI). During this test an ultrasound and blood pressure cuff are used to evaluate the arteries that supply the arms and legs with blood. Allow thirty minutes for this exam. There are no restrictions or special instructions.   Follow-Up: 6 weeks  Any Other Special Instructions Will Be Listed Below (If Applicable).  You have been referred to [facility] .They will call you to schedule an appointment.   If you need a refill on your cardiac medications before your next appointment, please call your pharmacy.     Your cardiac CT will be scheduled at one of the below locations:   [facility] [address] [place], [place] [address] [phone]  If scheduled at [facility], please arrive at the [facility] and Children's Entrance (Entrance C2) of [facility] 30 minutes prior to test start time. You can use the FREE valet parking offered at entrance C (encouraged to control the heart rate for the test)  Proceed to the [facility] Radiology Department (first floor) to check-in and test prep.  All radiology patients and guests should use entrance C2 at [facility], accessed from [facility], even though the hospital's physical address listed is [address]     Please follow these instructions carefully (unless otherwise directed):  Hold all erectile dysfunction medications at least 3 days (72 hrs) prior to test. (Ie viagra, cialis, sildenafil, tadalafil, etc) We will administer nitroglycerin during this exam.   On the Night Before the Test: Be sure to Drink plenty of water. Do not consume any caffeinated/decaffeinated beverages or chocolate 12 hours prior to your test. Do not take any antihistamines 12 hours prior to your test.  On the  Day of the Test: Drink plenty of water until 1 hour prior to the test. Do not eat any food 1 hour prior to test. You may take your regular medications prior to the test.  Take metoprolol (YOUR TOPROL XL 100 mg) two hours prior to test per Dr.Schumann HOLD Furosemide/Hydrochlorothiazide morning of the test.   After the Test: Drink plenty of water. After receiving IV contrast, you may experience a mild flushed feeling. This is normal. On occasion, you may experience a mild rash up to 24 hours after the test. This is not dangerous. If this occurs, you can take Benadryl 25 mg and increase your fluid intake. If you experience trouble breathing, this can be serious. If it is severe call 911 IMMEDIATELY. If it is mild, please call our office. If you take any of these medications: Glipizide/Metformin, Avandament, Glucavance, please do not take 48 hours after completing test unless otherwise instructed.  We will call to schedule your test 2-4 weeks out understanding that some insurance companies will need an authorization prior to the service being performed.   For non-scheduling related questions, please contact the cardiac imaging nurse navigator should you have any questions/concerns: Marchia Bond, Cardiac Imaging Nurse Navigator Gordy Clement, Cardiac Imaging Nurse Navigator [place] [facility] Direct Office Dial: [phone]   For scheduling needs, including cancellations and rescheduling, please call [place], [phone].

## 2022-01-08 ENCOUNTER — Ambulatory Visit: Payer: BC Managed Care – PPO | Admitting: Cardiology

## 2022-01-09 ENCOUNTER — Telehealth: Payer: Self-pay | Admitting: Emergency Medicine

## 2022-01-09 MED ORDER — DOXYCYCLINE HYCLATE 100 MG PO TABS: 100.0000 mg | ORAL_TABLET | Freq: Two times a day (BID) | ORAL | 0 refills | Status: DC

## 2022-01-09 NOTE — Telephone Encounter (Signed)
ATC LVMTCB x 1  

## 2022-01-09 NOTE — Telephone Encounter (Signed)
Patient called answering service, he developed a productive cough this morning. Coughing caused him to vomit this morning. He took one dose of Robitussin. Breathing is at his baseline, no worsening shortness of breath. Sending Doxycyline '100mg'$  twice daily x 7 days. O2 levels 96% on 3L liters. Denies fever or chills. Advised he check home covid, continue robitussin OTC every 4-6 hours for cough. Advised he go to [facility] or ED if symptoms worsen over the weekend.

## 2022-01-09 NOTE — Telephone Encounter (Signed)
Called patient, left message for him to call back.

## 2022-01-19 ENCOUNTER — Telehealth (HOSPITAL_COMMUNITY): Payer: Self-pay | Admitting: *Deleted

## 2022-01-19 ENCOUNTER — Encounter: Payer: Self-pay | Admitting: Physical Medicine and Rehabilitation

## 2022-01-19 ENCOUNTER — Encounter
Payer: BC Managed Care – PPO | Attending: Physical Medicine and Rehabilitation | Admitting: Physical Medicine and Rehabilitation

## 2022-01-19 VITALS — BP 122/76 | HR 84 | Ht 69.0 in | Wt 186.8 lb

## 2022-01-19 DIAGNOSIS — Z79891 Long term (current) use of opiate analgesic: Secondary | ICD-10-CM | POA: Diagnosis not present

## 2022-01-19 DIAGNOSIS — F1010 Alcohol abuse, uncomplicated: Secondary | ICD-10-CM | POA: Diagnosis not present

## 2022-01-19 DIAGNOSIS — M5136 Other intervertebral disc degeneration, lumbar region: Secondary | ICD-10-CM | POA: Insufficient documentation

## 2022-01-19 DIAGNOSIS — J9601 Acute respiratory failure with hypoxia: Secondary | ICD-10-CM | POA: Diagnosis not present

## 2022-01-19 DIAGNOSIS — G894 Chronic pain syndrome: Secondary | ICD-10-CM | POA: Insufficient documentation

## 2022-01-19 DIAGNOSIS — Z5181 Encounter for therapeutic drug level monitoring: Secondary | ICD-10-CM | POA: Diagnosis not present

## 2022-01-19 NOTE — Assessment & Plan Note (Addendum)
Reporting 4-6 swigs of vodka per day currently. Despite documented Hx DT in chart, patient adamantly denies any Hx withdrawal symptoms, including hallucinations or seizures.  Discussed in depth why alcohol abuse is problematic with current pain regimen and a direct contraindication to any potential long-acting opiate use, especially in setting of other co-morbidities.   Offered multiple times to refer patient to addiction management to assist in weaning off alcohol; patient insists he has no side effects prior with stopping, and prefers to quit without outside assistance. He is agreeable to abstaining from alcohol as part of continuation of current controlled substance contract.

## 2022-01-19 NOTE — Assessment & Plan Note (Addendum)
Patient agreeable to UDS today. If + or - alcohol, he is agreeable to abstaining from alcohol as part of his pain management contract from here on out. If UDS negative for other unexpected substances, he will get 1 month medication refill due 10/17. Will follow up in 1 month with [facility] for next refill, with following plan:  If + alcohol, patient agreeable to random UDS in the future; can be performed at outlying lab between follow ups.  If - alcohol, will perform  UDS at next 1 month follow up, and if WNL, Q3M after that. Still have potential for random screens in the future, which patient is aware of.

## 2022-01-19 NOTE — Patient Instructions (Signed)
Today, you had a urine drug screen performed. When results come back, if as expected, we will prescribe you 1 month medication and have you follow up before your next refill.   If it is positive for alcohol, we will continue to refill medication only on a monthly basis, with the understanding that you must abstain from alcohol moving forward. We will also be calling you about random drug screens in the future, to ensure you do not relapse back into drinking alcohol.   Regardless of results, you will be expected to abstain from alcohol moving forward to comply with your controlled substance contract.  I have referred you to Dr. Letta Pate for evaluation of ESI vs. MBB for pain control  Follow up with Zella Ball in 1 month for pain management

## 2022-01-19 NOTE — Telephone Encounter (Signed)
Attempted to call patient regarding upcoming cardiac CT appointment. °Left message on voicemail with name and callback number ° °Aidenjames Heckmann RN Navigator Cardiac Imaging °[facility] °[phone] Office °[phone] Cell ° °

## 2022-01-19 NOTE — Progress Notes (Signed)
Subjective:    Patient ID: Thomas Medici., male    DOB: 1955/05/15, 66 y.o.   MRN: [id number]  HPI  Thomas Tallman. is a 65 y.o. year old male presenting to PM&R clinic for pain management follow up. They were referred by Duane Boston, FNP for treatment of low back pain.   Pain has been poorly controlled on Oxycodone 5 mg BID PRN; no other interventions tried in recent history, however patient is cautious of interventions and does not believe he can perform ADLs or continue working without use of narcotic pain medication.    Interval Hx:  - Norco 5 mg tabs TID filled 12/26/21. TID dosing doing better, he says they last about 5 minutes though. He tries to have them last 6 hours.  - No more hypoglycemic or seizure like episodes since last time. Doing better overall with BG control.   - Reviewed most recent documentation in the chart, which states he has relapsed into drinking daily since prior quitting in 2020. He states that he drinks about 4-5 swigs of vodka per day (documented 5 qts per week). He says he initially started drinking sometime between 2020-2021, because "my pain medication wasn't lasting long enough". He claims his last pain medication provider was aware of this, and he was unaware of the need to abstain from drinking while on other controlled substances. He denies any Hx withdrawal side effects form alcohol. He refuses referral to addiction specialist to help wean off alcohol.    - Patient states he is cautious of ESI vs. MBB, but is agreeable to talk to Dr. Read Drivers about these as an option. ESI is primarily a concern because of Hx increased blood sugars with oral prednisone.    Pain Inventory Average Pain 8 Pain Right Now 7 My pain is aching  In the last 24 hours, has pain interfered with the following? General activity 5 Relation with others 0 Enjoyment of life 0 What TIME of day is your pain at its worst? morning , daytime, evening, and  night Sleep (in general) Fair  Pain is worse with: walking, bending, and standing Pain improves with: rest and medication Relief from Meds: 5  Family History  Problem Relation Age of Onset   Breast cancer Mother    Cancer Mother        Breast   Rheumatic fever Father    Heart disease Father    Heart attack Father        Massive    Diabetes Son    Social History   Socioeconomic History   Marital status: Married    Spouse name: Not on file   Number of children: Not on file   Years of education: Not on file   Highest education level: Not on file  Occupational History   Occupation: Tobacco Psychologist, sport and exercise  Tobacco Use   Smoking status: Every Day    Packs/day: 2.00    Years: 30.00    Total pack years: 60.00    Types: Cigarettes   Smokeless tobacco: Former    Quit date: 06/08/1978   Tobacco comments:     1 packs per day ARJ 12/17/21  Vaping Use   Vaping Use: Never used  Substance and Sexual Activity   Alcohol use: Yes    Comment: 5 quarts per week 12/26/21   Drug use: No   Sexual activity: Not on file  Other Topics Concern   Not on file  Social History Narrative   Lives  in [place] with wife and 2 sons.    Social Determinants of Health   Financial Resource Strain: Not on file  Food Insecurity: Unknown (11/27/2021)   Hunger Vital Sign    Worried About Running Out of Food in the Last Year: Patient refused    [place] in the Last Year: Patient refused  Transportation Needs: Unknown (11/27/2021)   PRAPARE - Hydrologist (Medical): Patient refused    Lack of Transportation (Non-Medical): Patient refused  Physical Activity: Not on file  Stress: Not on file  Social Connections: Not on file   Past Surgical History:  Procedure Laterality Date   CARDIOVERSION N/A 01/20/2021   Procedure: CARDIOVERSION;  Surgeon: Fay Records, MD;  Location: [facility];  Service: Cardiovascular;  Laterality: N/A;   CYSTOSCOPY WITH URETEROSCOPY Right 08/14/2013    Procedure: CYSTOSCOPY WITH URETEROSCOPY BLADDER BIOPSY ;  Surgeon: Claybon Jabs, MD;  Location: [facility];  Service: Urology;  Laterality: Right;   ESOPHAGOGASTRODUODENOSCOPY N/A 02/06/2013   Procedure: ESOPHAGOGASTRODUODENOSCOPY (EGD);  Surgeon: Lafayette Dragon, MD;  Location: [facility]COPY;  Service: Endoscopy;  Laterality: N/A;   LAPAROSCOPY N/A 11/25/2014   Procedure: LAPAROSCOPIC PRIMARY REPAIR OF PERFORATED PREPYLORIC ULCER WITH Silvestre Gunner;  Surgeon: Greer Pickerel, MD;  Location: [place];  Service: General;  Laterality: N/A;   TONSILLECTOMY  as child   TRANSTHORACIC ECHOCARDIOGRAM  02-17-2011   MODERATE LVH/  EF 65%   TRANSURETHRAL RESECTION OF BLADDER TUMOR WITH GYRUS (TURBT-GYRUS) N/A 06/12/2013   Procedure: TRANSURETHRAL RESECTION OF BLADDER TUMOR WITH GYRUS (TURBT-GYRUS);  Surgeon: Claybon Jabs, MD;  Location: [facility];  Service: Urology;  Laterality: N/A;   TYMPANIC MEMBRANE REPAIR  as child   Past Surgical History:  Procedure Laterality Date   CARDIOVERSION N/A 01/20/2021   Procedure: CARDIOVERSION;  Surgeon: Fay Records, MD;  Location: [place];  Service: Cardiovascular;  Laterality: N/A;   CYSTOSCOPY WITH URETEROSCOPY Right 08/14/2013   Procedure: CYSTOSCOPY WITH URETEROSCOPY BLADDER BIOPSY ;  Surgeon: Claybon Jabs, MD;  Location: [facility];  Service: Urology;  Laterality: Right;   ESOPHAGOGASTRODUODENOSCOPY N/A 02/06/2013   Procedure: ESOPHAGOGASTRODUODENOSCOPY (EGD);  Surgeon: Lafayette Dragon, MD;  Location: [facility] ENDOSCOPY;  Service: Endoscopy;  Laterality: N/A;   LAPAROSCOPY N/A 11/25/2014   Procedure: LAPAROSCOPIC PRIMARY REPAIR OF PERFORATED PREPYLORIC ULCER WITH Silvestre Gunner;  Surgeon: Greer Pickerel, MD;  Location: [place];  Service: General;  Laterality: N/A;   TONSILLECTOMY  as child   TRANSTHORACIC ECHOCARDIOGRAM  02-17-2011   MODERATE LVH/  EF 65%   TRANSURETHRAL RESECTION OF BLADDER TUMOR WITH GYRUS (TURBT-GYRUS) N/A  06/12/2013   Procedure: TRANSURETHRAL RESECTION OF BLADDER TUMOR WITH GYRUS (TURBT-GYRUS);  Surgeon: Claybon Jabs, MD;  Location: [facility];  Service: Urology;  Laterality: N/A;   TYMPANIC MEMBRANE REPAIR  as child   Past Medical History:  Diagnosis Date   Arthritis    Bladder cancer ([place])    Chronic back pain    Chronic combined systolic and diastolic CHF (congestive heart failure) ([place]) 07/16/2021   COPD (chronic obstructive pulmonary disease) (HCC)    DDD (degenerative disc disease)    Diabetic retinopathy of both eyes (HCC)    Essential hypertension    GERD (gastroesophageal reflux disease)    History of atrial flutter 02/2011   Converted to NSR with Cardizem   History of chronic bronchitis    History of hemolytic anemia 02/2011  secondary to Avelox   HOH (hard of hearing)    HOH (hard of hearing)    no eardrum and nerve damage on R, also HOH on L   Mitral valve prolapse    a. 2D Echo 11/27/14: EF 55-60%; images were inadequate for LV wall motion assessment, + mild late systolic mitral valve prolapse involving the anterior leaflet.   PAD (peripheral artery disease) ([place]) 04/2014   Dr Trula Slade; bilateral SFA occlusion, R mid, L distal   PAF (paroxysmal atrial fibrillation) ([place])    a. Dx 11/2014 during admission for perf ulcer.   Perforated ulcer ([place])    a. 11/2014 s/p surgery.   Productive cough    Smokers' cough (HCC)    Type 1 diabetes mellitus (HCC) 1977   BP 122/76   Pulse 84   Ht '5\' 9"'$  (1.753 m)   Wt 186 lb 12.8 oz (84.7 kg)   SpO2 98%   BMI 27.59 kg/m   Opioid Risk Score:   Fall Risk Score:  `1  Depression screen PHQ 2/9     11/17/2021    9:30 AM 04/28/2021    2:17 PM 01/24/2021    2:25 PM 12/17/2020    2:28 PM 10/25/2020    2:45 PM 05/01/2020    2:23 PM 01/30/2020    2:15 PM  Depression screen PHQ 2/9  Decreased Interest 0 0 0 0 0 0 0  Down, Depressed, Hopeless 0 0 0 0 0 0 0  PHQ - 2 Score 0 0 0 0 0 0 0  Altered sleeping 1 0  0 0     Tired, decreased energy 1 0  0 0    Change in appetite 0 0  0 0    Feeling bad or failure about yourself  0 0  0 0    Trouble concentrating 0 0  0 0    Moving slowly or fidgety/restless 0 0  0 0    Suicidal thoughts 0 0  0 0    PHQ-9 Score 2 0  0 0    Difficult doing work/chores  Not difficult at all   Not difficult at all       Review of Systems  Musculoskeletal:  Positive for back pain.  All other systems reviewed and are negative.     Objective:   Physical Exam  Constitution: Appropriate appearance for age. No apparent distress   HEENT: PERRL, EOMI grossly intact.  Resp: CTAB, reduced air movement. No rales, rhonchi, or wheezing. Cardio: RRR. No mumurs, rubs, or gallops. No peripheral edema. Abdomen: Nondistended. Nontender. +bowel sounds. Psych: Appropriate mood and affect Neuro: AAOx4. No apparent deficits. 5/5 strength in bilateral UE and LE in all myotomes.    Back Exam:    No TTP bilateral lumbar paraspinals, bilateral PSIS, or bilateral SI joints.   Facet loading: + Mildly increased LBP, bilateral   TTP at paraspinals: none   Forward bending: + Mildly increased bilateral LNP   Gait: Forward leaning, antalgic, no AD.        Assessment & Plan:  Thomas Amedee. is a 66 y.o. year old male presenting to PM&R clinic for pain management follow up. They were referred by Duane Boston, FNP for treatment of chronic low back pain. Today, discussed weaning off of alcohol as part of ongoing narcotic management, and referral to interventional pain for potential ESI vs. MBB/ablation.   Chronic pain syndrome Assessment & Plan: Indication for chronic opioid: M54.9 Medication and dose: Norco  5-325 mg TID PRN # pills per month: 90 Last UDS date: 01/19/22 Opioid Treatment Agreement signed (Y/N): Y Opioid Treatment Agreement last reviewed with patient:  11/17/21 NCCSRS reviewed this encounter (include red flags):  Y  Medium Risk (10-90 MME) UDS every 3-6  months NCCSR check every visit Follow up Q1M initially, Q2M once established  ORT 3 today; low risk abuse    Orders: -     Chiropodist  Encounter for therapeutic drug monitoring Assessment & Plan: Patient agreeable to UDS today. If + or - alcohol, he is agreeable to abstaining from alcohol as part of his pain management contract from here on out. If UDS negative for other unexpected substances, he will get 1 month medication refill due 10/17. Will follow up in 1 month with [facility] for next refill, with following plan:  If + alcohol, patient agreeable to random UDS in the future; can be performed at outlying lab between follow ups.  If - alcohol, will perform  UDS at next 1 month follow up, and if WNL, Q3M after that. Still have potential for random screens in the future, which patient is aware of.     Orders: -     Chiropodist  Encounter for long-term opiate analgesic use -     ToxAssure Select,+Antidepr,UR  Other intervertebral disc degeneration, lumbar region Assessment & Plan: Has been evaluated by spinal surgery Dr. Lorin Mercy 10/5, who has imaging showing R neuroforaminal stenosis, but would not be a candidate for decompression and fusion unless he quits smoking, which patient is currently unwilling to do.   Pending imaging results from their office for review.  Patient agreeable to referral to Dr. Letta Pate to discuss risks vs. Benefits of ESI vs. MBB/ablation for pain control. His primary concern at this time is blood sugar elevations from steroids.   ETOH abuse Assessment & Plan: Reporting 4-6 swigs of vodka per day currently. Despite documented Hx DT in chart, patient adamantly denies any Hx withdrawal symptoms, including hallucinations or seizures.  Discussed in depth why alcohol abuse is problematic with current pain regimen and a direct contraindication to any potential long-acting opiate use, especially in setting of other  co-morbidities.   Offered multiple times to refer patient to addiction management to assist in weaning off alcohol; patient insists he has no side effects prior with stopping, and prefers to quit without outside assistance. He is agreeable to abstaining from alcohol as part of continuation of current controlled substance contract.       Gertie Gowda, DO 01/19/2022   ** Addendum 11/16: UDS as expected, no alcohol detected. Norco 5-325 mg #90 tabs R0 sent to [place]**

## 2022-01-19 NOTE — Assessment & Plan Note (Signed)
Indication for chronic opioid: M54.9 Medication and dose: Norco 5-325 mg TID PRN # pills per month: 90 Last UDS date: 01/19/22 Opioid Treatment Agreement signed (Y/N): Y Opioid Treatment Agreement last reviewed with patient:  11/17/21 NCCSRS reviewed this encounter (include red flags):  Y  Medium Risk (10-90 MME) UDS every 3-6 months NCCSR check every visit Follow up Q1M initially, Q2M once established  ORT 3 today; low risk abuse

## 2022-01-19 NOTE — Assessment & Plan Note (Signed)
Has been evaluated by spinal surgery Dr. Lorin Mercy 10/5, who has imaging showing R neuroforaminal stenosis, but would not be a candidate for decompression and fusion unless he quits smoking, which patient is currently unwilling to do.   Pending imaging results from their office for review.  Patient agreeable to referral to Dr. Letta Pate to discuss risks vs. Benefits of ESI vs. MBB/ablation for pain control. His primary concern at this time is blood sugar elevations from steroids.

## 2022-01-20 ENCOUNTER — Telehealth (HOSPITAL_COMMUNITY): Payer: Self-pay | Admitting: *Deleted

## 2022-01-20 ENCOUNTER — Ambulatory Visit
Admission: RE | Admit: 2022-01-20 | Discharge: 2022-01-20 | Disposition: A | Discharge status: Home / Self Care | Payer: BC Managed Care – PPO | Source: Ambulatory Visit | Attending: Emergency Medicine | Admitting: Emergency Medicine

## 2022-01-20 DIAGNOSIS — I3139 Other pericardial effusion (noninflammatory): Secondary | ICD-10-CM | POA: Diagnosis not present

## 2022-01-20 DIAGNOSIS — J439 Emphysema, unspecified: Secondary | ICD-10-CM | POA: Diagnosis not present

## 2022-01-20 DIAGNOSIS — J479 Bronchiectasis, uncomplicated: Secondary | ICD-10-CM | POA: Diagnosis not present

## 2022-01-20 DIAGNOSIS — R911 Solitary pulmonary nodule: Secondary | ICD-10-CM | POA: Diagnosis not present

## 2022-01-20 NOTE — Telephone Encounter (Signed)
Reaching out to patient to offer assistance regarding upcoming cardiac imaging study; pt verbalizes understanding of appt date/time, parking situation and where to check in, and verified current allergies; name and call back number provided for further questions should they arise  Gordy Clement RN Navigator Cardiac Imaging [facility] [phone] office [phone] cell  Patient to take his daily metoprolol succinate two hours prior to his cardiac CT scan.  He is aware to arrive at 3pm and to get blood work.

## 2022-01-21 ENCOUNTER — Encounter (HOSPITAL_COMMUNITY): Payer: Self-pay

## 2022-01-21 ENCOUNTER — Ambulatory Visit (HOSPITAL_COMMUNITY)
Admission: RE | Admit: 2022-01-21 | Discharge: 2022-01-21 | Disposition: A | Discharge status: Home / Self Care | Payer: BC Managed Care – PPO | Source: Ambulatory Visit | Attending: Cardiology | Admitting: Cardiology

## 2022-01-21 DIAGNOSIS — I5042 Chronic combined systolic (congestive) and diastolic (congestive) heart failure: Secondary | ICD-10-CM

## 2022-01-21 DIAGNOSIS — R0602 Shortness of breath: Secondary | ICD-10-CM

## 2022-01-21 NOTE — Progress Notes (Signed)
Pt arrived in A-fib with HR in the 90s. Pt reports taking the metoprolol prior to arrival as instructed. Dr. Johnsie Cancel notified. Per MD abort CT at this time. CT technologist and CT heart navigator Merle notified. Pt informed of current plan and he verbalized agreement. Pt discharged home.

## 2022-01-22 ENCOUNTER — Other Ambulatory Visit: Payer: Self-pay | Admitting: *Deleted

## 2022-01-22 ENCOUNTER — Telehealth: Payer: Self-pay | Admitting: *Deleted

## 2022-01-22 DIAGNOSIS — M79605 Pain in left leg: Secondary | ICD-10-CM

## 2022-01-22 LAB — TOXASSURE SELECT,+ANTIDEPR,UR

## 2022-01-22 MED ORDER — HYDROCODONE-ACETAMINOPHEN 5-325 MG PO TABS: 1.0000 | ORAL_TABLET | Freq: Three times a day (TID) | ORAL | 0 refills | Status: DC | PRN

## 2022-01-22 NOTE — Addendum Note (Signed)
Addended by: Durel Salts on: 01/22/2022 02:30 PM   Modules accepted: Orders

## 2022-01-22 NOTE — Telephone Encounter (Signed)
Urine drug screen for this encounter is consistent for prescribed medication 

## 2022-01-23 ENCOUNTER — Telehealth: Payer: Self-pay

## 2022-01-23 ENCOUNTER — Telehealth: Payer: Self-pay | Admitting: Emergency Medicine

## 2022-01-23 NOTE — Telephone Encounter (Signed)
Called and spoke with patient. He stated that he has not been feeling well for the past few days. He had called on 11/3 and was prescribed doxycycline. He felt better after finishing the abx but got sick again from his son and grandchildren.   He has a productive cough with thick yellow phlegm. He also has a runny nose with yellow discharge. He denied have any body aches, fever or sore throat. Also denied having any increased SOB or wheezing.   I did look to see if I could get him scheduled for an OV for today but there are no openings.   He wants to know if he could have something sent in for him. [facility] [place].   Judson Roch, can you please advise? Thanks!

## 2022-01-23 NOTE — Telephone Encounter (Signed)
Patient call back to see if you will be sending in his pain medication? If granted please send to [facility] in [place] .

## 2022-01-23 NOTE — Telephone Encounter (Signed)
Called patient and answered all questions. Medications sent in yesterday, communicated UDS was negative for alcohol. He endorses abstaining form alcohol since last visit without side effect.    Gertie Gowda, DO 01/23/2022

## 2022-01-23 NOTE — Telephone Encounter (Signed)
Called and spoke with patient. He verbalized understanding. ? ?Nothing further needed at time of call.  ?

## 2022-01-23 NOTE — Telephone Encounter (Signed)
Called and spoke with patient. He verbalized understanding of Sarah's recommendations. He stated that he was terrified of the prednisone. He has diabetes and he was on prednisone back during the summer while in the hospital. The prednisone caused his blood sugar to spike, which affected the vision in his right eye. He is still having issues with his vision in that eye. He stated that if it is absolutely necessary he would take the prednisone but he wanted to double check first.   I also mentioned a CXR and OV for next week. He stated that he completed a CT on 11/14 and wanted to know if Judson Roch could view the scan.   Judson Roch, can you please advise? Thanks!

## 2022-01-26 NOTE — Telephone Encounter (Signed)
Please set him up w an OV with RB, next available opening to review his sx and his CT chest. Overall the Ct chest was reassuring.

## 2022-01-26 NOTE — Telephone Encounter (Signed)
ATC LVMTCB x 1. Front desk please schedule pt in 1st available spot (any). Thank you

## 2022-01-26 NOTE — Telephone Encounter (Signed)
Pt called back and is having a lot of breathing issues and request antibiotic[facility] [place]. Pt did schedule an appointment on 11/22 at 8:45am.

## 2022-01-28 ENCOUNTER — Encounter: Payer: Self-pay | Admitting: Emergency Medicine

## 2022-01-28 ENCOUNTER — Ambulatory Visit: Payer: BC Managed Care – PPO | Admitting: Emergency Medicine

## 2022-01-28 VITALS — BP 134/72 | HR 81 | Temp 98.5°F | Ht 68.0 in | Wt 185.2 lb

## 2022-01-28 DIAGNOSIS — J439 Emphysema, unspecified: Secondary | ICD-10-CM

## 2022-01-28 DIAGNOSIS — R911 Solitary pulmonary nodule: Secondary | ICD-10-CM

## 2022-01-28 DIAGNOSIS — R946 Abnormal results of thyroid function studies: Secondary | ICD-10-CM

## 2022-01-28 DIAGNOSIS — J4489 Other specified chronic obstructive pulmonary disease: Secondary | ICD-10-CM | POA: Diagnosis not present

## 2022-01-28 DIAGNOSIS — J449 Chronic obstructive pulmonary disease, unspecified: Secondary | ICD-10-CM

## 2022-01-28 DIAGNOSIS — J189 Pneumonia, unspecified organism: Secondary | ICD-10-CM | POA: Diagnosis not present

## 2022-01-28 MED ORDER — DOXYCYCLINE HYCLATE 100 MG PO TABS: 100.0000 mg | ORAL_TABLET | Freq: Two times a day (BID) | ORAL | 0 refills | Status: DC

## 2022-01-28 MED ORDER — LEVALBUTEROL HCL 0.31 MG/3ML IN NEBU: 1.0000 | INHALATION_SOLUTION | RESPIRATORY_TRACT | 12 refills | Status: DC | PRN

## 2022-01-28 MED ORDER — IPRATROPIUM-ALBUTEROL 0.5-2.5 (3) MG/3ML IN SOLN: 3.0000 mL | RESPIRATORY_TRACT | 0 refills | Status: DC | PRN

## 2022-01-28 MED ORDER — PREDNISONE 10 MG PO TABS: ORAL_TABLET | ORAL | 0 refills | Status: DC

## 2022-01-28 NOTE — Patient Instructions (Signed)
We will plan to repeat your CT scan of the chest in mid February 2024 to compare with priors. Please take prednisone as directed until completely gone Please take doxycycline as directed until completely gone. Continue your Trelegy once daily.  Rinse and gargle after using. Congratulations on decreasing your cigarettes.  Continue to try to decrease.  Our ultimate goal will be to stop altogether. Follow Dr. Lamonte Sakai in February after your CT so we can review the results together.  Call sooner if you have any problems.

## 2022-01-28 NOTE — Addendum Note (Signed)
Addended by: Gavin Potters R on: 01/28/2022 09:34 AM   Modules accepted: Orders

## 2022-01-28 NOTE — Progress Notes (Signed)
Subjective:    Patient ID: Thomas Sandoval., male    DOB: 12-06-1955, 66 y.o.   MRN: [id number]  HPI  ROV 12/17/2021 --Thomas Sandoval has a history of tobacco abuse with associated COPD and chronic bronchus symptoms.  Also with an abnormal CT scan of the chest with scattered pulmonary nodular disease, most notable 1.7 cm nodule in the left upper lobe. He is still coughing yellow sputum, may have improved some after doxy in September. He has cut down cigarettes to less than 2 pks a day  Super D CT chest 12/01/2021 reviewed by me showed scale resolution of right-sided pulmonary infiltrates, 1.7 cm left upper lobe rounded nodule with cavitation, stable in size but more cavitary than on prior from June.  6 mm left upper lobe nodule more anteriorly that is stable in size   ROV 01/28/22 --follow-up visit 67 year old man with active tobacco abuse and associated COPD, chronic bronchitic symptoms.  I been following him for an abnormal CT scan of the chest with pulmonary nodular disease, initial scan identifying a 1.7 cm left upper lobe nodule in the setting of a right lower lobe pneumonia June 2023.  The pneumonia had cleared in September, left-sided pulmonary nodules remained, question more cavitary but stable in size.  I treated him with a more extended course of antibiotics for possible small abscess.  He returns today following repeat CT done 11/14. He has cut his smoking down to 0.75 pk/day. On trelegy, using albuterol . There has been a URI running through the  family - he has had it as well. He is having more cough, more exertional SOB. Having nasal congestion and drainage - still having it.   CT chest 01/20/2022 reviewed by me, shows cylindrical bronchiectasis particularly in the right lower lobe, associated moderate centrilobular emphysema.  The 1.7 cm left upper lobe pulmonary nodule has not significantly changed in size, is less cavitary.  Satellite left upper lobe nodule 6 mm is unchanged as are  other scattered bilateral pulmonary nodules.   Review of Systems As per HPI  Past Medical History:  Diagnosis Date   Arthritis    Bladder cancer ([place])    Chronic back pain    Chronic combined systolic and diastolic CHF (congestive heart failure) ([place]) 07/16/2021   COPD (chronic obstructive pulmonary disease) (HCC)    DDD (degenerative disc disease)    Diabetic retinopathy of both eyes (HCC)    Essential hypertension    GERD (gastroesophageal reflux disease)    History of atrial flutter 02/2011   Converted to NSR with Cardizem   History of chronic bronchitis    History of hemolytic anemia 02/2011   secondary to Avelox   HOH (hard of hearing)    HOH (hard of hearing)    no eardrum and nerve damage on R, also HOH on L   Mitral valve prolapse    a. 2D Echo 11/27/14: EF 55-60%; images were inadequate for LV wall motion assessment, + mild late systolic mitral valve prolapse involving the anterior leaflet.   PAD (peripheral artery disease) ([place]) 04/2014   Dr Trula Slade; bilateral SFA occlusion, R mid, L distal   PAF (paroxysmal atrial fibrillation) ([place])    a. Dx 11/2014 during admission for perf ulcer.   Perforated ulcer ([place])    a. 11/2014 s/p surgery.   Productive cough    Smokers' cough ([place])    Type 1 diabetes mellitus ([place]) 1977     Family History  Problem Relation Age of  Onset   Breast cancer Mother    Cancer Mother        Breast   Rheumatic fever Father    Heart disease Father    Heart attack Father        Massive    Diabetes Son      Social History   Socioeconomic History   Marital status: Married    Spouse name: Not on file   Number of children: Not on file   Years of education: Not on file   Highest education level: Not on file  Occupational History   Occupation: Tobacco Farmer  Tobacco Use   Smoking status: Every Day    Packs/day: 2.00    Years: 30.00    Total pack years: 60.00    Types: Cigarettes   Smokeless tobacco: Former    Quit date: 06/08/1978    Tobacco comments:    3/4 of a pack of cigarettes smoked daily [facility] 01/28/22  Vaping Use   Vaping Use: Never used  Substance and Sexual Activity   Alcohol use: Yes    Comment: 5 quarts per week 12/26/21   Drug use: No   Sexual activity: Not on file  Other Topics Concern   Not on file  Social History Narrative   Lives in [place] with wife and 2 sons.    Social Determinants of Health   Financial Resource Strain: Not on file  Food Insecurity: Unknown (11/27/2021)   Hunger Vital Sign    Worried About Running Out of Food in the Last Year: Patient refused    [place] in the Last Year: Patient refused  Transportation Needs: Unknown (11/27/2021)   PRAPARE - Hydrologist (Medical): Patient refused    Lack of Transportation (Non-Medical): Patient refused  Physical Activity: Not on file  Stress: Not on file  Social Connections: Not on file  Intimate Partner Violence: Unknown (11/27/2021)   Humiliation, Afraid, Rape, and Kick questionnaire    Fear of Current or Ex-Partner: Patient refused    Emotionally Abused: Patient refused    Physically Abused: Patient refused    Sexually Abused: Patient refused     Allergies  Allergen Reactions   Azithromycin Other (See Comments) and Nausea And Vomiting    "Severe stomach cramps; told to list as an allergy by dr. Huel Cote ago"   Avelox [Moxifloxacin Hcl In Nacl] Other (See Comments)    Hemolysis  In 2012   Bactrim [Sulfamethoxazole-Trimethoprim] Diarrhea and Nausea And Vomiting   Moxifloxacin Other (See Comments)   Sulfamethoxazole-Trimethoprim Other (See Comments)     Outpatient Medications Prior to Visit  Medication Sig Dispense Refill   albuterol (VENTOLIN HFA) 108 (90 Base) MCG/ACT inhaler Inhale 2 puffs into the lungs every 6 (six) hours as needed for wheezing or shortness of breath. 18 g 2   cetirizine (ZYRTEC) 10 MG tablet Take 10 mg by mouth daily.     empagliflozin (JARDIANCE) 10 MG TABS tablet Take 1  tablet (10 mg total) by mouth daily before breakfast. 30 tablet 11   famotidine (PEPCID) 20 MG tablet Take 1 tablet (20 mg total) by mouth every morning. Reported on 04/16/2015 30 tablet 5   furosemide (LASIX) 40 MG tablet Take 1 tablet (40 mg total) by mouth daily. 90 tablet 3   HYDROcodone-acetaminophen (NORCO/VICODIN) 5-325 MG tablet Take 1 tablet by mouth 3 (three) times daily as needed for severe pain. 90 tablet 0   insulin glargine (LANTUS) 100 UNIT/ML injection  Inject 0.2 mLs (20 Units total) into the skin daily. 30 mL 5   Insulin Syringe-Needle U-100 (B-D INS SYR ULTRAFINE 1CC/30G) 30G X 1/2" 1 ML MISC Use 4 times a day with insulin Dx E11.9 400 each 3   Insulin Syringes, Disposable, U-100 1 ML MISC Use 4 times a day for insulin injection Dx E11.9 400 each 5   ipratropium-albuterol (DUONEB) 0.5-2.5 (3) MG/3ML SOLN TAKE 3 MLS BY NEBULIZATION EVERY 4 (FOUR) HOURS AS NEEDED. 360 mL 0   levalbuterol (XOPENEX) 0.31 MG/3ML nebulizer solution Take 3 mLs (0.31 mg total) by nebulization every 4 (four) hours as needed for wheezing. 3 mL 12   losartan (COZAAR) 50 MG tablet Take 1 tablet (50 mg total) by mouth at bedtime. hold this medication until follow up     metoprolol succinate (TOPROL-XL) 100 MG 24 hr tablet Take 1 tablet (100 mg total) by mouth daily. Take with or immediately following a meal. 90 tablet 3   naloxone (NARCAN) nasal spray 4 mg/0.1 mL Place 1 spray into the nose as needed (For opiate overdose). 1 each 2   NOVOLOG 100 UNIT/ML injection PER SLIDING SCALE: 190 - 200 = 2 UNITS. 300 AND ABOVE = 7 UNITS. 10 mL 1   OVER THE COUNTER MEDICATION Take 1 Scoop by mouth in the morning and at bedtime. Super Beets     rivaroxaban (XARELTO) 20 MG TABS tablet Take 1 tablet (20 mg total) by mouth daily with supper. 30 tablet 5   TRELEGY ELLIPTA 100-62.5-25 MCG/ACT AEPB Inhale 1 puff into the lungs daily.     No facility-administered medications prior to visit.         Objective:   Physical  Exam  Vitals:   01/28/22 0904  BP: 134/72  Pulse: 81  Temp: 98.5 F (36.9 C)  TempSrc: Oral  SpO2: 98%  Weight: 185 lb 3.2 oz (84 kg)  Height: '5\' 8"'$  (1.727 m)   Gen: Pleasant, well-nourished, in no distress,  normal affect, frequent cough  ENT: No lesions,  mouth clear,  oropharynx clear, no postnasal drip, very poor hearing   Neck: No JVD, no stridor  Lungs: No use of accessory muscles, coarse bilaterally with bilateral rhonchi and expiratory wheezes n  Cardiovascular: RRR, heart sounds normal, no murmur or gallops, no peripheral edema  Musculoskeletal: No deformities, no cyanosis or clubbing  Neuro: alert, awake, non focal  Skin: Warm, no lesions or rash     Assessment & Plan:  COPD with chronic bronchitis and emphysema (HCC) Flaring symptoms with increased cough, dyspnea, wheeze.  He does have frequent bronchitic symptoms, is usually symptomatic if he is not being treated with antibiotics.  In this particular instance it appears that he has had a URI that has run through his family.  He is rhonchorous, wheezing on exam.  Needs to be treated.  We will give him prednisone and doxycycline.  Discussed smoking cessation with him.  He has cut down.  Continue Trelegy as his maintenance medication  Nodule of left lung Left upper lobe pulmonary nodules are stable in size and appearance.  The 1.7 cm nodule remains quite suspicious although cavitary lesion, question mucus impaction, etc.  We talked about the pros and cons of proceeding with bronchoscopy for tissue diagnosis.  For now we will going to continue to follow it, plan a repeat CT scan in 3 months, February.  Depending on interval change we will decide whether to pursue PET scan, pursue bronchoscopy for tissue diagnosis.  Baltazar Apo, MD, PhD 01/28/2022, 9:22 AM [facility] [phone] or if no answer before 7:00PM call [phone] For any issues after 7:00PM please call eLink [phone]

## 2022-01-28 NOTE — Assessment & Plan Note (Signed)
Left upper lobe pulmonary nodules are stable in size and appearance.  The 1.7 cm nodule remains quite suspicious although cavitary lesion, question mucus impaction, etc.  We talked about the pros and cons of proceeding with bronchoscopy for tissue diagnosis.  For now we will going to continue to follow it, plan a repeat CT scan in 3 months, February.  Depending on interval change we will decide whether to pursue PET scan, pursue bronchoscopy for tissue diagnosis.

## 2022-01-28 NOTE — Assessment & Plan Note (Signed)
Flaring symptoms with increased cough, dyspnea, wheeze.  He does have frequent bronchitic symptoms, is usually symptomatic if he is not being treated with antibiotics.  In this particular instance it appears that he has had a URI that has run through his family.  He is rhonchorous, wheezing on exam.  Needs to be treated.  We will give him prednisone and doxycycline.  Discussed smoking cessation with him.  He has cut down.  Continue Trelegy as his maintenance medication

## 2022-01-30 ENCOUNTER — Telehealth: Payer: Self-pay | Admitting: Primary Care

## 2022-01-30 NOTE — Telephone Encounter (Signed)
Patient called on-call service. He recently saw Dr. Lamonte Sakai 2 days ago was put on doxycycline and prednisone. He reports to call service he is not feeling any better and was noted to have trouble breathing while speaking.  I attempted to contact patient by calling phone number listed but there was no answer.  Left message for patient.  Recommending urgent care or ED evaluation since he is no better on antibiotics and steroids.

## 2022-02-01 NOTE — Progress Notes (Signed)
Cardiology Office Note:    Date:  02/04/2022   ID:  [name], DOB 1955-10-19, MRN [id number]  PCP:  Chevis Pretty, FNP  Cardiologist:  Donato Heinz, MD  Electrophysiologist:  Vickie Epley, MD   Referring MD: Hassell Done, [name], *   No chief complaint on file.   History of Present Illness:    Thomas Kock. is a 66 y.o. male with a hx of alcohol abuse, tobacco abuse, COPD, perforated gastric ulcer status postrepair in 2016, T2DM, hypertension, PAD, MVP, atrial fibrillation/flutter, systolic heart failure who presents for follow-up.  He was hospitalized September 2022 with acute pancreatitis found to be in A. fib.  Echo at that time showed EF 30 to 35%.  He left AMA.  At follow-up with his PCP 12/17/2020 he was still in A. fib and started on Xarelto and diltiazem.  He was referred to A. fib clinic, seen on 12/25/2020.  He underwent cardioversion on 01/20/2021 with successful restoration of sinus rhythm.  Echocardiogram 08/26/2017 showed EF 55 to 60%, mild mitral valve prolapse.  Echo 12/05/2020 showed EF 30 to 35%, global hypokinesis, and low normal RV function, moderate left atrial enlargement, trivial MR.  Zio patch x3 days on 02/10/2021 showed high percent A. fib burden with average rate 95 bpm.  Echocardiogram 05/21/2021 showed EF 55 to 44%, grade 2 diastolic dysfunction, normal RV function, no significant valvular disease.  He was admitted 11/2021 after being found unresponsive with significant hypoglycemia and hypotension.  EKG with aVR elevation and diffuse ST depressions, suggesting global ischemia.  EKG normalized with correction of his hypoglycemia.  He was seen by cardiology and outpatient coronary CT was recommended.  Coronary CTA was attempted but he was in A-fib on presentation and CT was canceled.  Since last clinic visit, he reports recently started on treatment for COPD exacerbation with prednisone and doxycycline.  Taking Lasix 40 mg  daily.  Reports stable lower extremity edema.  Denies any chest pain.  Wt Readings from Last 3 Encounters:  02/04/22 195 lb 9.6 oz (88.7 kg)  02/02/22 188 lb (85.3 kg)  01/28/22 185 lb 3.2 oz (84 kg)    Past Medical History:  Diagnosis Date   Arthritis    Bladder cancer ([place])    Chronic back pain    Chronic combined systolic and diastolic CHF (congestive heart failure) ([place]) 07/16/2021   COPD (chronic obstructive pulmonary disease) ([place])    DDD (degenerative disc disease)    Diabetic retinopathy of both eyes ([place])    Essential hypertension    GERD (gastroesophageal reflux disease)    History of atrial flutter 02/2011   Converted to NSR with Cardizem   History of chronic bronchitis    History of hemolytic anemia 02/2011   secondary to Avelox   HOH (hard of hearing)    HOH (hard of hearing)    no eardrum and nerve damage on R, also HOH on L   Mitral valve prolapse    a. 2D Echo 11/27/14: EF 55-60%; images were inadequate for LV wall motion assessment, + mild late systolic mitral valve prolapse involving the anterior leaflet.   PAD (peripheral artery disease) ([place]) 04/2014   Dr Trula Slade; bilateral SFA occlusion, R mid, L distal   PAF (paroxysmal atrial fibrillation) ([place])    a. Dx 11/2014 during admission for perf ulcer.   Perforated ulcer ([place])    a. 11/2014 s/p surgery.   Productive cough    Smokers' cough ([place])  Type 1 diabetes mellitus ([place]) 1977    Past Surgical History:  Procedure Laterality Date   CARDIOVERSION N/A 01/20/2021   Procedure: CARDIOVERSION;  Surgeon: Fay Records, MD;  Location: [facility];  Service: Cardiovascular;  Laterality: N/A;   CYSTOSCOPY WITH URETEROSCOPY Right 08/14/2013   Procedure: CYSTOSCOPY WITH URETEROSCOPY BLADDER BIOPSY ;  Surgeon: Claybon Jabs, MD;  Location: [facility];  Service: Urology;  Laterality: Right;   ESOPHAGOGASTRODUODENOSCOPY N/A 02/06/2013   Procedure: ESOPHAGOGASTRODUODENOSCOPY (EGD);  Surgeon: Lafayette Dragon, MD;  Location[facility] ENDOSCOPY;  Service: Endoscopy;  Laterality: N/A;   LAPAROSCOPY N/A 11/25/2014   Procedure: LAPAROSCOPIC PRIMARY REPAIR OF PERFORATED PREPYLORIC ULCER WITH Silvestre Gunner;  Surgeon: Greer Pickerel, MD;  Location: [facility];  Service: General;  Laterality: N/A;   TONSILLECTOMY  as child   TRANSTHORACIC ECHOCARDIOGRAM  02-17-2011   MODERATE LVH/  EF 65%   TRANSURETHRAL RESECTION OF BLADDER TUMOR WITH GYRUS (TURBT-GYRUS) N/A 06/12/2013   Procedure: TRANSURETHRAL RESECTION OF BLADDER TUMOR WITH GYRUS (TURBT-GYRUS);  Surgeon: Claybon Jabs, MD;  Location: [facility];  Service: Urology;  Laterality: N/A;   TYMPANIC MEMBRANE REPAIR  as child    Current Medications: Current Meds  Medication Sig   albuterol (VENTOLIN HFA) 108 (90 Base) MCG/ACT inhaler Inhale 2 puffs into the lungs every 6 (six) hours as needed for wheezing or shortness of breath.   cetirizine (ZYRTEC) 10 MG tablet Take 10 mg by mouth daily.   doxycycline (VIBRA-TABS) 100 MG tablet Take 1 tablet (100 mg total) by mouth 2 (two) times daily.   empagliflozin (JARDIANCE) 10 MG TABS tablet Take 1 tablet (10 mg total) by mouth daily before breakfast.   famotidine (PEPCID) 20 MG tablet Take 1 tablet (20 mg total) by mouth every morning. Reported on 04/16/2015   furosemide (LASIX) 40 MG tablet Take 1 tablet (40 mg total) by mouth daily.   HYDROcodone-acetaminophen (NORCO/VICODIN) 5-325 MG tablet Take 1 tablet by mouth 3 (three) times daily as needed for severe pain.   insulin aspart (NOVOLOG) 100 UNIT/ML injection Per sliding scale 190-200=2u. 300 and above 7u   insulin glargine (LANTUS) 100 UNIT/ML injection Inject 0.2 mLs (20 Units total) into the skin daily.   Insulin Syringe-Needle U-100 (B-D INS SYR ULTRAFINE 1CC/30G) 30G X 1/2" 1 ML MISC Use 4 times a day with insulin Dx E11.9   Insulin Syringes, Disposable, U-100 1 ML MISC Use 4 times a day for insulin injection Dx E11.9   ipratropium-albuterol (DUONEB)  0.5-2.5 (3) MG/3ML SOLN Take 3 mLs by nebulization every 4 (four) hours as needed.   levalbuterol (XOPENEX) 0.31 MG/3ML nebulizer solution Take 3 mLs (0.31 mg total) by nebulization every 4 (four) hours as needed for wheezing.   losartan (COZAAR) 50 MG tablet Take 1 tablet (50 mg total) by mouth at bedtime. hold this medication until follow up   metoprolol succinate (TOPROL-XL) 100 MG 24 hr tablet Take 1 tablet (100 mg total) by mouth daily. Take with or immediately following a meal.   naloxone (NARCAN) nasal spray 4 mg/0.1 mL Place 1 spray into the nose as needed (For opiate overdose).   OVER THE COUNTER MEDICATION Take 1 Scoop by mouth in the morning and at bedtime. Super Beets   predniSONE (DELTASONE) 10 MG tablet Take 4 tablets X 3 days, 3 tabs X 3 days, 2 tabs x 3 days, 1 tab x 3 days   rivaroxaban (XARELTO) 20 MG TABS tablet Take 1 tablet (20 mg  total) by mouth daily with supper.   TRELEGY ELLIPTA 100-62.5-25 MCG/ACT AEPB Inhale 1 puff into the lungs daily.     Allergies:   Azithromycin, Avelox [moxifloxacin hcl in nacl], Bactrim [sulfamethoxazole-trimethoprim], Moxifloxacin, and Sulfamethoxazole-trimethoprim   Social History   Socioeconomic History   Marital status: Married    Spouse name: Not on file   Number of children: Not on file   Years of education: Not on file   Highest education level: Not on file  Occupational History   Occupation: Tobacco Farmer  Tobacco Use   Smoking status: Every Day    Packs/day: 2.00    Years: 30.00    Total pack years: 60.00    Types: Cigarettes   Smokeless tobacco: Former    Quit date: 06/08/1978   Tobacco comments:    3/4 of a pack of cigarettes smoked daily [facility] 01/28/22  Vaping Use   Vaping Use: Never used  Substance and Sexual Activity   Alcohol use: Yes    Comment: 5 quarts per week 12/26/21   Drug use: No   Sexual activity: Not on file  Other Topics Concern   Not on file  Social History Narrative   Lives in [place] with wife and 2  sons.    Social Determinants of Health   Financial Resource Strain: Not on file  Food Insecurity: Unknown (11/27/2021)   Hunger Vital Sign    Worried About Running Out of Food in the Last Year: Patient refused    Ran Out of Food in the Last Year: Patient refused  Transportation Needs: Unknown (11/27/2021)   PRAPARE - Hydrologist (Medical): Patient refused    Lack of Transportation (Non-Medical): Patient refused  Physical Activity: Not on file  Stress: Not on file  Social Connections: Not on file     Family History: The patient's family history includes Breast cancer in his mother; Cancer in his mother; Diabetes in his son; Heart attack in his father; Heart disease in his father; Rheumatic fever in his father.  ROS:   Please see the history of present illness.     All other systems reviewed and are negative.  EKGs/Labs/Other Studies Reviewed:    The following studies were reviewed today:   EKG:   02/04/22: Atrial fibrillation, rate 104 11/19/2021: Normal sinus rhythm, rate 72, left axis deviation 08/20/2021: Atrial flutter with variable conduction, rate 87 05/20/21: Normal sinus rhythm, rate 61, left axis deviation, nonspecific T wave flattening, Q waves in leads III, aVF 03/14/21:NSR, ST depression<1 mm in inferior leads and V4-6, rate 61  Recent Labs: 07/16/2021: B Natriuretic Peptide 950.1 08/17/2021: TSH 0.322 11/27/2021: Magnesium 1.9 02/02/2022: ALT 14; BUN 22; Creatinine, Ser 1.29; Hemoglobin 15.5; Platelets 329; Potassium 4.0; Sodium 138  Recent Lipid Panel    Component Value Date/Time   CHOL 104 02/02/2022 1426   TRIG 98 02/02/2022 1426   HDL 48 02/02/2022 1426   CHOLHDL 2.2 02/02/2022 1426   LDLCALC 37 02/02/2022 1426    Physical Exam:    VS:  BP 115/60   Pulse (!) 104   Ht '5\' 9"'$  (1.753 m)   Wt 195 lb 9.6 oz (88.7 kg)   SpO2 98%   BMI 28.89 kg/m     Wt Readings from Last 3 Encounters:  02/04/22 195 lb 9.6 oz (88.7 kg)   02/02/22 188 lb (85.3 kg)  01/28/22 185 lb 3.2 oz (84 kg)     GEN:  Well nourished, well developed in no  acute distress HEENT: Normal NECK: No JVD; No carotid bruits CARDIAC: RRR, no murmurs, rubs, gallops RESPIRATORY:  Clear to auscultation without rales, wheezing or rhonchi  ABDOMEN: Soft, non-tender, non-distended MUSCULOSKELETAL: 1+ edema; No deformity  SKIN: Warm and dry NEUROLOGIC:  Alert and oriented x 3 PSYCHIATRIC:  Normal affect   ASSESSMENT:    1. Persistent atrial fibrillation ([place])   2. Chronic combined systolic and diastolic CHF (congestive heart failure) ([place])   3. Tobacco use   4. PAD (peripheral artery disease) ([facility])      PLAN:    Persistent atrial fibrillation/flutter: CHA2DS2-VASc score 4.  Underwent successful cardioversion on 01/20/2021, but was back in A. fib at follow-up appointment on 01/24/2021.  Zio patch x3 days on 02/10/2021 showed 100% percent A. fib burden with average rate 95 bpm.  Subsequently converted to sinus rhythm, but was in a flutter at clinic appointment 08/20/2021.  He is now back in Afib -Continue Xarelto 20 mg daily -Continue Toprol-XL 100 mg daily -Given possible tachycardia induced cardiomyopathy, would recommend rhythm control strategy.  Referred to EP to consider ablation versus antiarrhythmic.  Seen by Dr Quentin Ore, had been considering Tikosyn load but patient requested holding off.  He is now agreeable with proceeding, will schedule f/u in Afib clinic  Chronic combined systolic and diastolic heart failure: EF 30 to 35% on echocardiogram during admission with acute pancreatitis in September 2022.  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  Echocardiogram 05/21/2021 showed EF 55 to 60% -Continue Lasix 40 mg daily.   -Continue Toprol-XL 100 mg daily -Continue losartan 50 mg daily -Continue Jardiance 10 mg daily  Abnormal EKG: He was admitted 11/2021 after being found unresponsive with significant hypoglycemia and hypotension.  EKG with  aVR elevation and diffuse ST depressions, suggesting global ischemia.  EKG normalized with correction of his hypoglycemia.  He was seen by cardiology and outpatient coronary CT was recommended.  He denies any chest pain but having dyspnea on exertion -Recommend coronary CTA to evaluate for obstructive CAD.  Coronary CTA was attempted but he was in A-fib on presentation and was canceled.  Can reattempt once back in sinus rhythm  Alcohol abuse: Counseled risk of alcohol abuse and cessation recommended.  He initially stopped drinking after admission in September 2022, but reports he has been drinking recently.  Cessation recommended.  Tobacco use: Patient counseled on the risk of tobacco use and cessation strongly encouraged  Hypertension: Continue Toprol-XL, losartan  T2DM: On insulin  PAD: ABIs 2019 are severely reduced (right 0.52, left 0.48).  Lower extremity duplex 04/2014 showed bilateral SFA occlusions.  Previously followed with vascular surgery but not seen since 2019.  Recheck ABIs and refer to [facility]   RTC in 6 weeks   Medication Adjustments/Labs and Tests Ordered: Current medicines are reviewed at length with the patient today.  Concerns regarding medicines are outlined above.  Orders Placed This Encounter  Procedures   EKG 12-Lead    No orders of the defined types were placed in this encounter.   Patient Instructions  Medication Instructions:  Your physician recommends that you continue on your current medications as directed. Please refer to the Current Medication list given to you today.  *If you need a refill on your cardiac medications before your next appointment, please call your pharmacy*  Follow-Up: At [facility], you and your health needs are our priority.  As part of our continuing mission to provide you with exceptional heart care, we have created designated Provider Care Teams.  These  Care Teams include your primary Cardiologist (physician) and Advanced  Practice Providers (APPs -  Physician Assistants and Nurse Practitioners) who all work together to provide you with the care you need, when you need it.  We recommend signing up for the patient portal called "MyChart".  Sign up information is provided on this After Visit Summary.  MyChart is used to connect with patients for Virtual Visits (Telemedicine).  Patients are able to view lab/test results, encounter notes, upcoming appointments, etc.  Non-urgent messages can be sent to your provider as well.   To learn more about what you can do with MyChart, go to [email or web address].    Your next appointment:   AFIB clinic in next week (this week or next) 6 weeks with Dr. Gardiner Rhyme        Signed, Donato Heinz, MD  02/04/2022 11:15 AM    [place]

## 2022-02-02 ENCOUNTER — Encounter: Payer: Self-pay | Admitting: Nurse Practitioner

## 2022-02-02 ENCOUNTER — Ambulatory Visit: Payer: BC Managed Care – PPO | Admitting: Nurse Practitioner

## 2022-02-02 VITALS — BP 112/71 | HR 107 | Temp 96.7°F | Resp 20 | Ht 68.0 in | Wt 188.0 lb

## 2022-02-02 DIAGNOSIS — I5042 Chronic combined systolic (congestive) and diastolic (congestive) heart failure: Secondary | ICD-10-CM | POA: Diagnosis not present

## 2022-02-02 DIAGNOSIS — E871 Hypo-osmolality and hyponatremia: Secondary | ICD-10-CM

## 2022-02-02 DIAGNOSIS — I10 Essential (primary) hypertension: Secondary | ICD-10-CM

## 2022-02-02 DIAGNOSIS — J439 Emphysema, unspecified: Secondary | ICD-10-CM

## 2022-02-02 DIAGNOSIS — J4489 Other specified chronic obstructive pulmonary disease: Secondary | ICD-10-CM

## 2022-02-02 DIAGNOSIS — Z720 Tobacco use: Secondary | ICD-10-CM

## 2022-02-02 DIAGNOSIS — E10649 Type 1 diabetes mellitus with hypoglycemia without coma: Secondary | ICD-10-CM | POA: Diagnosis not present

## 2022-02-02 DIAGNOSIS — I4819 Other persistent atrial fibrillation: Secondary | ICD-10-CM

## 2022-02-02 DIAGNOSIS — M545 Low back pain, unspecified: Secondary | ICD-10-CM

## 2022-02-02 DIAGNOSIS — K2100 Gastro-esophageal reflux disease with esophagitis, without bleeding: Secondary | ICD-10-CM

## 2022-02-02 DIAGNOSIS — G8929 Other chronic pain: Secondary | ICD-10-CM

## 2022-02-02 DIAGNOSIS — F1010 Alcohol abuse, uncomplicated: Secondary | ICD-10-CM

## 2022-02-02 LAB — BAYER DCA HB A1C WAIVED: HB A1C (BAYER DCA - WAIVED): 6 % — ABNORMAL HIGH (ref 4.8–5.6)

## 2022-02-02 MED ORDER — LOSARTAN POTASSIUM 50 MG PO TABS: 50.0000 mg | ORAL_TABLET | Freq: Every day | ORAL | 1 refills | Status: DC

## 2022-02-02 MED ORDER — METOPROLOL SUCCINATE ER 100 MG PO TB24: 100.0000 mg | ORAL_TABLET | Freq: Every day | ORAL | 1 refills | Status: DC

## 2022-02-02 MED ORDER — FAMOTIDINE 20 MG PO TABS: 20.0000 mg | ORAL_TABLET | Freq: Every morning | ORAL | 1 refills | Status: DC

## 2022-02-02 MED ORDER — EMPAGLIFLOZIN 10 MG PO TABS: 10.0000 mg | ORAL_TABLET | Freq: Every day | ORAL | 11 refills | Status: DC

## 2022-02-02 MED ORDER — INSULIN ASPART 100 UNIT/ML IJ SOLN: INTRAMUSCULAR | 1 refills | Status: DC

## 2022-02-02 MED ORDER — RIVAROXABAN 20 MG PO TABS: 20.0000 mg | ORAL_TABLET | Freq: Every day | ORAL | 5 refills | Status: DC

## 2022-02-02 MED ORDER — INSULIN GLARGINE 100 UNIT/ML ~~LOC~~ SOLN: 20.0000 [IU] | Freq: Every day | SUBCUTANEOUS | 5 refills | Status: DC

## 2022-02-02 NOTE — Telephone Encounter (Signed)
I spoke with the patient - he is starting to improve, is completing the abx and pred. Will remain in communication with him.

## 2022-02-02 NOTE — Progress Notes (Signed)
Subjective:    Patient ID: Thomas Sandoval., male    DOB: 23-Jul-1955, 66 y.o.   MRN: [id number]   Chief Complaint: medical management of chronic issues     HPI:  Thomas Sandoval. is a 66 y.o. who identifies as a male who was assigned male at birth.   Social history: Lives with: wife  Work history: owns a farm   Comes in today for follow up of the following chronic medical issues:  1. Essential hypertension No c/o chest pain, sob or headache. Does not check blood pressure at home. BP Readings from Last 3 Encounters:  02/02/22 112/71  01/28/22 134/72  01/21/22 (!) 156/80    2. Persistent atrial fibrillation/flutter with rapid ventricular response ([place]) 3. Chronic combined systolic and diastolic CHF (congestive heart failure) ([place]) Last saw cardiology on 01/06/22. Review of office note showed on change to plan to care. He was in NSR at last visit  4. COPD with chronic bronchitis and emphysema ([place]) Is on oxygen at home. Last saw pulmonology on 01/28/22. CT scan showed possible lung nodule. He decided to repeat CT scan in 3 months instead of have a bronchoscopy.  5. Hyponatremia Lab Results  Component Value Date   NA 135 11/28/2021   K 4.2 11/28/2021   CO2 23 11/28/2021   GLUCOSE 226 (H) 11/28/2021   BUN 12 11/28/2021   CREATININE 1.19 11/28/2021   CALCIUM 8.8 (L) 11/28/2021   EGFR 64 10/30/2021   GFRNONAA >60 11/28/2021      6. ETOH abuse Says he is no longer drinking alcohol.  7. Uncontrolled type 1 diabetes mellitus with hypoglycemia, with long-term current use of insulin (HCC) Fasting blood sugars are up and down depending on what he eats. He doesnot rally watch diet very closely. Lab Results  Component Value Date   HGBA1C 5.2 10/30/2021     8. Chronic bilateral low back pain without sciatica He sees pain management.  9. Tobacco use Smokes over a pack a day. He says he goes outside and yurns his oxygen off.    New complaints: None  today  Allergies  Allergen Reactions   Azithromycin Other (See Comments) and Nausea And Vomiting    "Severe stomach cramps; told to list as an allergy by dr. Huel Cote ago"   Avelox [Moxifloxacin Hcl In Nacl] Other (See Comments)    Hemolysis  In 2012   Bactrim [Sulfamethoxazole-Trimethoprim] Diarrhea and Nausea And Vomiting   Moxifloxacin Other (See Comments)   Sulfamethoxazole-Trimethoprim Other (See Comments)   Outpatient Encounter Medications as of 02/02/2022  Medication Sig   albuterol (VENTOLIN HFA) 108 (90 Base) MCG/ACT inhaler Inhale 2 puffs into the lungs every 6 (six) hours as needed for wheezing or shortness of breath.   cetirizine (ZYRTEC) 10 MG tablet Take 10 mg by mouth daily.   doxycycline (VIBRA-TABS) 100 MG tablet Take 1 tablet (100 mg total) by mouth 2 (two) times daily.   empagliflozin (JARDIANCE) 10 MG TABS tablet Take 1 tablet (10 mg total) by mouth daily before breakfast.   famotidine (PEPCID) 20 MG tablet Take 1 tablet (20 mg total) by mouth every morning. Reported on 04/16/2015   furosemide (LASIX) 40 MG tablet Take 1 tablet (40 mg total) by mouth daily.   HYDROcodone-acetaminophen (NORCO/VICODIN) 5-325 MG tablet Take 1 tablet by mouth 3 (three) times daily as needed for severe pain.   insulin glargine (LANTUS) 100 UNIT/ML injection Inject 0.2 mLs (20 Units total) into the skin daily.  Insulin Syringe-Needle U-100 (B-D INS SYR ULTRAFINE 1CC/30G) 30G X 1/2" 1 ML MISC Use 4 times a day with insulin Dx E11.9   Insulin Syringes, Disposable, U-100 1 ML MISC Use 4 times a day for insulin injection Dx E11.9   ipratropium-albuterol (DUONEB) 0.5-2.5 (3) MG/3ML SOLN Take 3 mLs by nebulization every 4 (four) hours as needed.   levalbuterol (XOPENEX) 0.31 MG/3ML nebulizer solution Take 3 mLs (0.31 mg total) by nebulization every 4 (four) hours as needed for wheezing.   losartan (COZAAR) 50 MG tablet Take 1 tablet (50 mg total) by mouth at bedtime. hold this medication until follow up    metoprolol succinate (TOPROL-XL) 100 MG 24 hr tablet Take 1 tablet (100 mg total) by mouth daily. Take with or immediately following a meal.   naloxone (NARCAN) nasal spray 4 mg/0.1 mL Place 1 spray into the nose as needed (For opiate overdose).   NOVOLOG 100 UNIT/ML injection PER SLIDING SCALE: 190 - 200 = 2 UNITS. 300 AND ABOVE = 7 UNITS.   OVER THE COUNTER MEDICATION Take 1 Scoop by mouth in the morning and at bedtime. Super Beets   predniSONE (DELTASONE) 10 MG tablet Take 4 tablets X 3 days, 3 tabs X 3 days, 2 tabs x 3 days, 1 tab x 3 days   rivaroxaban (XARELTO) 20 MG TABS tablet Take 1 tablet (20 mg total) by mouth daily with supper.   TRELEGY ELLIPTA 100-62.5-25 MCG/ACT AEPB Inhale 1 puff into the lungs daily.   No facility-administered encounter medications on file as of 02/02/2022.    Past Surgical History:  Procedure Laterality Date   CARDIOVERSION N/A 01/20/2021   Procedure: CARDIOVERSION;  Surgeon: Fay Records, MD;  Location: [place];  Service: Cardiovascular;  Laterality: N/A;   CYSTOSCOPY WITH URETEROSCOPY Right 08/14/2013   Procedure: CYSTOSCOPY WITH URETEROSCOPY BLADDER BIOPSY ;  Surgeon: Claybon Jabs, MD;  Location: [facility];  Service: Urology;  Laterality: Right;   ESOPHAGOGASTRODUODENOSCOPY N/A 02/06/2013   Procedure: ESOPHAGOGASTRODUODENOSCOPY (EGD);  Surgeon: Lafayette Dragon, MD;  Location: [facility] ENDOSCOPY;  Service: Endoscopy;  Laterality: N/A;   LAPAROSCOPY N/A 11/25/2014   Procedure: LAPAROSCOPIC PRIMARY REPAIR OF PERFORATED PREPYLORIC ULCER WITH Silvestre Gunner;  Surgeon: Greer Pickerel, MD;  Location: [place];  Service: General;  Laterality: N/A;   TONSILLECTOMY  as child   TRANSTHORACIC ECHOCARDIOGRAM  02-17-2011   MODERATE LVH/  EF 65%   TRANSURETHRAL RESECTION OF BLADDER TUMOR WITH GYRUS (TURBT-GYRUS) N/A 06/12/2013   Procedure: TRANSURETHRAL RESECTION OF BLADDER TUMOR WITH GYRUS (TURBT-GYRUS);  Surgeon: Claybon Jabs, MD;  Location: [facility];  Service: Urology;  Laterality: N/A;   TYMPANIC MEMBRANE REPAIR  as child    Family History  Problem Relation Age of Onset   Breast cancer Mother    Cancer Mother        Breast   Rheumatic fever Father    Heart disease Father    Heart attack Father        Massive    Diabetes Son       Controlled substance contract: n/a     Review of Systems  Constitutional:  Negative for diaphoresis.  Eyes:  Negative for pain.  Respiratory:  Negative for shortness of breath.   Cardiovascular:  Negative for chest pain, palpitations and leg swelling.  Gastrointestinal:  Negative for abdominal pain.  Endocrine: Negative for polydipsia.  Skin:  Negative for rash.  Neurological:  Negative for dizziness, weakness and headaches.  Hematological:  Does not bruise/bleed easily.  All other systems reviewed and are negative.      Objective:   Physical Exam Vitals and nursing note reviewed.  Constitutional:      Appearance: Normal appearance. He is well-developed.  HENT:     Head: Normocephalic.     Nose: Nose normal.     Mouth/Throat:     Mouth: Mucous membranes are moist.     Pharynx: Oropharynx is clear.  Eyes:     Pupils: Pupils are equal, round, and reactive to light.  Neck:     Thyroid: No thyroid mass or thyromegaly.     Vascular: No carotid bruit or JVD.     Trachea: Phonation normal.  Cardiovascular:     Rate and Rhythm: Normal rate and regular rhythm.  Pulmonary:     Effort: Pulmonary effort is normal. No respiratory distress.     Breath sounds: Normal breath sounds.  Abdominal:     General: Bowel sounds are normal.     Palpations: Abdomen is soft.     Tenderness: There is no abdominal tenderness.  Musculoskeletal:        General: Normal range of motion.     Cervical back: Normal range of motion and neck supple.  Lymphadenopathy:     Cervical: No cervical adenopathy.  Skin:    General: Skin is warm and dry.  Neurological:     Mental Status: He is  alert and oriented to person, place, and time.  Psychiatric:        Behavior: Behavior normal.        Thought Content: Thought content normal.        Judgment: Judgment normal.    BP 112/71   Pulse (!) 107   Temp (!) 96.7 F (35.9 C) (Temporal)   Resp 20   Ht _0  (1.727 m)   Wt 188 lb (85.3 kg)   SpO2 91%   BMI 28.59 kg/m   HGBA1c 6.0%      Assessment & Plan:  Thomas Sandoval. comes in today with chief complaint of Medical Management of Chronic Issues   Diagnosis and orders addressed:  1. Essential hypertension Low sodium diet - CBC with Differential/Platelet - CMP14+EGFR - losartan (COZAAR) 50 MG tablet; Take 1 tablet (50 mg total) by mouth at bedtime. hold this medication until follow up  Dispense: 90 tablet; Refill: 1  2. Persistent atrial fibrillation/flutter with rapid ventricular response ([place]) Keep follow up with cardiology - rivaroxaban (XARELTO) 20 MG TABS tablet; Take 1 tablet (20 mg total) by mouth daily with supper.  Dispense: 30 tablet; Refill: 5  3. Chronic combined systolic and diastolic CHF (congestive heart failure) (HCC) - metoprolol succinate (TOPROL-XL) 100 MG 24 hr tablet; Take 1 tablet (100 mg total) by mouth daily. Take with or immediately following a meal.  Dispense: 90 tablet; Refill: 1  4. COPD with chronic bronchitis and emphysema ([place]) Keep follow up with pulmonology  5. Hyponatremia Labs pending  6. ETOH abuse Continue to avoid alcohol  7. Uncontrolled type 1 diabetes mellitus with hypoglycemia, with long-term current use of insulin (HCC) oCntinue to watch carbs on diet - Bayer DCA Hb A1c Waived - Lipid panel - Microalbumin / creatinine urine ratio - empagliflozin (JARDIANCE) 10 MG TABS tablet; Take 1 tablet (10 mg total) by mouth daily before breakfast.  Dispense: 30 tablet; Refill: 11 - insulin glargine (LANTUS) 100 UNIT/ML injection; Inject 0.2 mLs (20 Units total) into the skin daily.  Dispense: 30 mL; Refill: 5 -  rivaroxaban (XARELTO) 20 MG TABS tablet; Take 1 tablet (20 mg total) by mouth daily with supper.  Dispense: 30 tablet; Refill: 5 - insulin aspart (NOVOLOG) 100 UNIT/ML injection; Per sliding scale 190-200=2u. 300 and above 7u  Dispense: 10 mL; Refill: 1  8. Chronic bilateral low back pain without sciatica Keep follow up with pain management  9. Tobacco use Smoking cessation encouraged  10. Gastroesophageal reflux disease with esophagitis without hemorrhage Avoid spicy foods Do not eat 2 hours prior to bedtime  - famotidine (PEPCID) 20 MG tablet; Take 1 tablet (20 mg total) by mouth every morning. Reported on 04/16/2015  Dispense: 90 tablet; Refill: 1   Labs pending Health Maintenance reviewed Diet and exercise encouraged  Follow up plan: 6 months   [name] Hassell Done, FNP

## 2022-02-02 NOTE — Patient Instructions (Signed)

## 2022-02-03 LAB — CMP14+EGFR
ALT: 14 IU/L (ref 0–44)
AST: 20 IU/L (ref 0–40)
Albumin/Globulin Ratio: 1.8 (ref 1.2–2.2)
Albumin: 4.3 g/dL (ref 3.9–4.9)
Alkaline Phosphatase: 69 IU/L (ref 44–121)
BUN/Creatinine Ratio: 17 (ref 10–24)
BUN: 22 mg/dL (ref 8–27)
Bilirubin Total: <0.2 mg/dL (ref 0.0–1.2)
CO2: 27 mmol/L (ref 20–29)
Calcium: 9.6 mg/dL (ref 8.6–10.2)
Chloride: 94 mmol/L — ABNORMAL LOW (ref 96–106)
Creatinine, Ser: 1.29 mg/dL — ABNORMAL HIGH (ref 0.76–1.27)
Globulin, Total: 2.4 g/dL (ref 1.5–4.5)
Glucose: 51 mg/dL — ABNORMAL LOW (ref 70–99)
Potassium: 4 mmol/L (ref 3.5–5.2)
Sodium: 138 mmol/L (ref 134–144)
Total Protein: 6.7 g/dL (ref 6.0–8.5)
eGFR: 62 mL/min/{1.73_m2} (ref >59)

## 2022-02-03 LAB — LIPID PANEL
Chol/HDL Ratio: 2.2 ratio (ref 0.0–5.0)
Cholesterol, Total: 104 mg/dL (ref 100–199)
HDL: 48 mg/dL (ref >39)
LDL Chol Calc (NIH): 37 mg/dL (ref 0–99)
Triglycerides: 98 mg/dL (ref 0–149)
VLDL Cholesterol Cal: 19 mg/dL (ref 5–40)

## 2022-02-03 LAB — CBC WITH DIFFERENTIAL/PLATELET
Basophils Absolute: 0.1 10*3/uL (ref 0.0–0.2)
Basos: 1 %
EOS (ABSOLUTE): 0 10*3/uL (ref 0.0–0.4)
Eos: 0 %
Hematocrit: 45 % (ref 37.5–51.0)
Hemoglobin: 15.5 g/dL (ref 13.0–17.7)
Immature Grans (Abs): 0.5 10*3/uL — ABNORMAL HIGH (ref 0.0–0.1)
Immature Granulocytes: 4 %
Lymphocytes Absolute: 1.5 10*3/uL (ref 0.7–3.1)
Lymphs: 14 %
MCH: 35.1 pg — ABNORMAL HIGH (ref 26.6–33.0)
MCHC: 34.4 g/dL (ref 31.5–35.7)
MCV: 102 fL — ABNORMAL HIGH (ref 79–97)
Monocytes Absolute: 1.2 10*3/uL — ABNORMAL HIGH (ref 0.1–0.9)
Monocytes: 11 %
Neutrophils Absolute: 7.9 10*3/uL — ABNORMAL HIGH (ref 1.4–7.0)
Neutrophils: 70 %
Platelets: 329 10*3/uL (ref 150–450)
RBC: 4.41 x10E6/uL (ref 4.14–5.80)
RDW: 12.1 % (ref 11.6–15.4)
WBC: 11.2 10*3/uL — ABNORMAL HIGH (ref 3.4–10.8)

## 2022-02-03 LAB — MICROALBUMIN / CREATININE URINE RATIO
Creatinine, Urine: 24.3 mg/dL
Microalb/Creat Ratio: 21 mg/g creat (ref 0–29)
Microalbumin, Urine: 5.1 ug/mL

## 2022-02-04 ENCOUNTER — Encounter: Payer: Self-pay | Admitting: Cardiology

## 2022-02-04 ENCOUNTER — Ambulatory Visit: Payer: BC Managed Care – PPO | Attending: Cardiology | Admitting: Cardiology

## 2022-02-04 VITALS — BP 115/60 | HR 104 | Ht 69.0 in | Wt 195.6 lb

## 2022-02-04 DIAGNOSIS — I739 Peripheral vascular disease, unspecified: Secondary | ICD-10-CM | POA: Diagnosis not present

## 2022-02-04 DIAGNOSIS — I4819 Other persistent atrial fibrillation: Secondary | ICD-10-CM | POA: Diagnosis not present

## 2022-02-04 DIAGNOSIS — Z720 Tobacco use: Secondary | ICD-10-CM | POA: Diagnosis not present

## 2022-02-04 DIAGNOSIS — I5042 Chronic combined systolic (congestive) and diastolic (congestive) heart failure: Secondary | ICD-10-CM

## 2022-02-04 NOTE — Patient Instructions (Addendum)
Medication Instructions:  Your physician recommends that you continue on your current medications as directed. Please refer to the Current Medication list given to you today.  *If you need a refill on your cardiac medications before your next appointment, please call your pharmacy*  Follow-Up: At [facility], you and your health needs are our priority.  As part of our continuing mission to provide you with exceptional heart care, we have created designated Provider Care Teams.  These Care Teams include your primary Cardiologist (physician) and Advanced Practice Providers (APPs -  Physician Assistants and Nurse Practitioners) who all work together to provide you with the care you need, when you need it.  We recommend signing up for the patient portal called "MyChart".  Sign up information is provided on this After Visit Summary.  MyChart is used to connect with patients for Virtual Visits (Telemedicine).  Patients are able to view lab/test results, encounter notes, upcoming appointments, etc.  Non-urgent messages can be sent to your provider as well.   To learn more about what you can do with MyChart, go to [email or web address].    Your next appointment:   AFIB clinic in next week (this week or next) 6 weeks with Dr. Gardiner Rhyme

## 2022-02-09 ENCOUNTER — Ambulatory Visit (HOSPITAL_COMMUNITY)
Admission: RE | Admit: 2022-02-09 | Discharge: 2022-02-09 | Disposition: A | Discharge status: Home / Self Care | Payer: BC Managed Care – PPO | Source: Ambulatory Visit | Attending: Surgery | Admitting: Surgery

## 2022-02-09 ENCOUNTER — Ambulatory Visit (INDEPENDENT_AMBULATORY_CARE_PROVIDER_SITE_OTHER): Payer: BC Managed Care – PPO | Admitting: Surgery

## 2022-02-09 ENCOUNTER — Encounter: Payer: Self-pay | Admitting: Surgery

## 2022-02-09 VITALS — BP 150/74 | HR 89 | Temp 97.9°F | Resp 20 | Ht 69.0 in | Wt 195.0 lb

## 2022-02-09 DIAGNOSIS — M79605 Pain in left leg: Secondary | ICD-10-CM

## 2022-02-09 DIAGNOSIS — I70213 Atherosclerosis of native arteries of extremities with intermittent claudication, bilateral legs: Secondary | ICD-10-CM | POA: Diagnosis not present

## 2022-02-09 DIAGNOSIS — M79604 Pain in right leg: Secondary | ICD-10-CM

## 2022-02-09 NOTE — Progress Notes (Signed)
[facility] [place]  Patient name: Thomas Sandoval. MRN: [id number] DOB: 1956-01-14 Sex: male   REQUESTING PROVIDER:    Dr. Gardiner Rhyme   REASON FOR CONSULT:    PAD  HISTORY OF PRESENT ILLNESS:   Clarence Cogswell. is a 66 y.o. male, who is known to me for lower extremity vascular disease.  I last saw him in 2016.  At that time he could walk 6-700 yards before he began experiencing symptoms.  Bilateral superficial femoral artery occlusion.  He was started on cilostazol which did help.  He was last in our office in 2019.  He continues to smoke.  He works as a Systems developer.  His walking is limited by his back.  He does not have any open wounds.  Patient has significant lower back issues which is generally what limits his activity.  He is not on a statin because his LDL is very low.  The patient has been a diabetic since age 23.  His A1c is well controlled.  He continues to smoke.  PAST MEDICAL HISTORY    Past Medical History:  Diagnosis Date   Arthritis    Bladder cancer ([place])    Chronic back pain    Chronic combined systolic and diastolic CHF (congestive heart failure) ([place]) 07/16/2021   COPD (chronic obstructive pulmonary disease) (HCC)    DDD (degenerative disc disease)    Diabetic retinopathy of both eyes (HCC)    Essential hypertension    GERD (gastroesophageal reflux disease)    History of atrial flutter 02/2011   Converted to NSR with Cardizem   History of chronic bronchitis    History of hemolytic anemia 02/2011   secondary to Avelox   HOH (hard of hearing)    HOH (hard of hearing)    no eardrum and nerve damage on R, also HOH on L   Mitral valve prolapse    a. 2D Echo 11/27/14: EF 55-60%; images were inadequate for LV wall motion assessment, + mild late systolic mitral valve prolapse involving the anterior leaflet.   PAD (peripheral artery disease) ([place]) 04/2014   Dr Trula Slade; bilateral SFA occlusion, R  mid, L distal   PAF (paroxysmal atrial fibrillation) ([place])    a. Dx 11/2014 during admission for perf ulcer.   Perforated ulcer ([place])    a. 11/2014 s/p surgery.   Productive cough    Smokers' cough ([place])    Type 1 diabetes mellitus ([place]) 1977     FAMILY HISTORY   Family History  Problem Relation Age of Onset   Breast cancer Mother    Cancer Mother        Breast   Rheumatic fever Father    Heart disease Father    Heart attack Father        Massive    Diabetes Son     SOCIAL HISTORY:   Social History   Socioeconomic History   Marital status: Married    Spouse name: Not on file   Number of children: Not on file   Years of education: Not on file   Highest education level: Not on file  Occupational History   Occupation: Tobacco Farmer  Tobacco Use   Smoking status: Every Day    Packs/day: 2.00    Years: 30.00    Total pack years: 60.00    Types: Cigarettes   Smokeless tobacco: Former    Quit date: 06/08/1978   Tobacco comments:    3/4 of  a pack of cigarettes smoked daily ARJ 01/28/22  Vaping Use   Vaping Use: Never used  Substance and Sexual Activity   Alcohol use: Yes    Comment: 5 quarts per week 12/26/21   Drug use: No   Sexual activity: Not on file  Other Topics Concern   Not on file  Social History Narrative   Lives in [place] with wife and 2 sons.    Social Determinants of Health   Financial Resource Strain: Not on file  Food Insecurity: Unknown (11/27/2021)   Hunger Vital Sign    Worried About Running Out of Food in the Last Year: Patient refused    [place] in the Last Year: Patient refused  Transportation Needs: Unknown (11/27/2021)   PRAPARE - Hydrologist (Medical): Patient refused    Lack of Transportation (Non-Medical): Patient refused  Physical Activity: Not on file  Stress: Not on file  Social Connections: Not on file  Intimate Partner Violence: Unknown (11/27/2021)   Humiliation, Afraid, Rape, and Kick  questionnaire    Fear of Current or Ex-Partner: Patient refused    Emotionally Abused: Patient refused    Physically Abused: Patient refused    Sexually Abused: Patient refused    ALLERGIES:    Allergies  Allergen Reactions   Azithromycin Other (See Comments) and Nausea And Vomiting    "Severe stomach cramps; told to list as an allergy by dr. Huel Cote ago"   Avelox [Moxifloxacin Hcl In Nacl] Other (See Comments)    Hemolysis  In 2012   Bactrim [Sulfamethoxazole-Trimethoprim] Diarrhea and Nausea And Vomiting   Moxifloxacin Other (See Comments)   Sulfamethoxazole-Trimethoprim Other (See Comments)    CURRENT MEDICATIONS:    Current Outpatient Medications  Medication Sig Dispense Refill   albuterol (VENTOLIN HFA) 108 (90 Base) MCG/ACT inhaler Inhale 2 puffs into the lungs every 6 (six) hours as needed for wheezing or shortness of breath. 18 g 2   cetirizine (ZYRTEC) 10 MG tablet Take 10 mg by mouth daily.     doxycycline (VIBRA-TABS) 100 MG tablet Take 1 tablet (100 mg total) by mouth 2 (two) times daily. 14 tablet 0   empagliflozin (JARDIANCE) 10 MG TABS tablet Take 1 tablet (10 mg total) by mouth daily before breakfast. 30 tablet 11   famotidine (PEPCID) 20 MG tablet Take 1 tablet (20 mg total) by mouth every morning. Reported on 04/16/2015 90 tablet 1   furosemide (LASIX) 40 MG tablet Take 1 tablet (40 mg total) by mouth daily. 90 tablet 3   HYDROcodone-acetaminophen (NORCO/VICODIN) 5-325 MG tablet Take 1 tablet by mouth 3 (three) times daily as needed for severe pain. 90 tablet 0   insulin aspart (NOVOLOG) 100 UNIT/ML injection Per sliding scale 190-200=2u. 300 and above 7u 10 mL 1   insulin glargine (LANTUS) 100 UNIT/ML injection Inject 0.2 mLs (20 Units total) into the skin daily. 30 mL 5   Insulin Syringe-Needle U-100 (B-D INS SYR ULTRAFINE 1CC/30G) 30G X 1/2" 1 ML MISC Use 4 times a day with insulin Dx E11.9 400 each 3   Insulin Syringes, Disposable, U-100 1 ML MISC Use 4 times a  day for insulin injection Dx E11.9 400 each 5   ipratropium-albuterol (DUONEB) 0.5-2.5 (3) MG/3ML SOLN Take 3 mLs by nebulization every 4 (four) hours as needed. 360 mL 0   levalbuterol (XOPENEX) 0.31 MG/3ML nebulizer solution Take 3 mLs (0.31 mg total) by nebulization every 4 (four) hours as needed for wheezing. 3  mL 12   losartan (COZAAR) 50 MG tablet Take 1 tablet (50 mg total) by mouth at bedtime. hold this medication until follow up 90 tablet 1   metoprolol succinate (TOPROL-XL) 100 MG 24 hr tablet Take 1 tablet (100 mg total) by mouth daily. Take with or immediately following a meal. 90 tablet 1   naloxone (NARCAN) nasal spray 4 mg/0.1 mL Place 1 spray into the nose as needed (For opiate overdose). 1 each 2   OVER THE COUNTER MEDICATION Take 1 Scoop by mouth in the morning and at bedtime. Super Beets     predniSONE (DELTASONE) 10 MG tablet Take 4 tablets X 3 days, 3 tabs X 3 days, 2 tabs x 3 days, 1 tab x 3 days 30 tablet 0   rivaroxaban (XARELTO) 20 MG TABS tablet Take 1 tablet (20 mg total) by mouth daily with supper. 30 tablet 5   TRELEGY ELLIPTA 100-62.5-25 MCG/ACT AEPB Inhale 1 puff into the lungs daily.     No current facility-administered medications for this visit.    REVIEW OF SYSTEMS:   '[X]'$  denotes positive finding, '[ ]'$  denotes negative finding Cardiac  Comments:  Chest pain or chest pressure:    Shortness of breath upon exertion:    Short of breath when lying flat:    Irregular heart rhythm:        Vascular    Pain in calf, thigh, or hip brought on by ambulation:    Pain in feet at night that wakes you up from your sleep:     Blood clot in your veins:    Leg swelling:         Pulmonary    Oxygen at home: x   Productive cough:     Wheezing:         Neurologic    Sudden weakness in arms or legs:     Sudden numbness in arms or legs:     Sudden onset of difficulty speaking or slurred speech:    Temporary loss of vision in one eye:     Problems with dizziness:          Gastrointestinal    Blood in stool:      Vomited blood:         Genitourinary    Burning when urinating:     Blood in urine:        Psychiatric    Major depression:         Hematologic    Bleeding problems:    Problems with blood clotting too easily:        Skin    Rashes or ulcers:        Constitutional    Fever or chills:     PHYSICAL EXAM:   Vitals:   02/09/22 1444  BP: (!) 150/74  Pulse: 89  Resp: 20  Temp: 97.9 F (36.6 C)  SpO2: 96%  Weight: 195 lb (88.5 kg)  Height: '5\' 9"'$  (1.753 m)    GENERAL: The patient is a well-nourished male, in no acute distress. The vital signs are documented above. CARDIAC: There is a regular rate and rhythm.  VASCULAR: Nonpalpable pedal pulses.  Bilateral edema. PULMONARY: Nonlabored respirations.  On home oxygen MUSCULOSKELETAL: There are no major deformities or cyanosis. NEUROLOGIC: No focal weakness or paresthesias are detected. SKIN: There are no ulcers or rashes noted. PSYCHIATRIC: The patient has a normal affect.  STUDIES:   I have reviewed the following:   +-------+-----------+-----------+------------+------------+  ABI/TBIToday's ABIToday's TBIPrevious ABIPrevious TBI  +-------+-----------+-----------+------------+------------+  Right 0.36       0.34       0.52        0.36          +-------+-----------+-----------+------------+------------+  Left  0.45       0.36       0.48        0.34          +-------+-----------+-----------+------------+------------+  Right great toe: 49 left great toe: 52 Waveforms are monophasic  ASSESSMENT and PLAN   PAD: The patient's blood flow is relatively stable.  He continues to be limited by his back rather than his vascular disease.  He does not have any open wounds or rest pain.  I have again stressed the importance of smoking cessation, however this is not likely to occur.  He should continue with his statin and antiplatelet therapy.  He will follow-up in 1  year with repeat ABIs.  He knows to contact me should he develop a wound or ulcer that does not heal.   Annamarie Major, IV, MD, FACS [facility] [phone] Pager [phone]

## 2022-02-10 ENCOUNTER — Telehealth: Payer: Self-pay | Admitting: Emergency Medicine

## 2022-02-10 ENCOUNTER — Encounter: Payer: BC Managed Care – PPO | Attending: Physical Medicine and Rehabilitation | Admitting: Registered Nurse

## 2022-02-10 ENCOUNTER — Encounter: Payer: Self-pay | Admitting: Registered Nurse

## 2022-02-10 VITALS — BP 137/71 | HR 89 | Ht 69.0 in | Wt 195.0 lb

## 2022-02-10 DIAGNOSIS — Z79891 Long term (current) use of opiate analgesic: Secondary | ICD-10-CM | POA: Diagnosis not present

## 2022-02-10 DIAGNOSIS — F1010 Alcohol abuse, uncomplicated: Secondary | ICD-10-CM | POA: Diagnosis not present

## 2022-02-10 DIAGNOSIS — M545 Low back pain, unspecified: Secondary | ICD-10-CM | POA: Insufficient documentation

## 2022-02-10 DIAGNOSIS — Z5181 Encounter for therapeutic drug level monitoring: Secondary | ICD-10-CM | POA: Insufficient documentation

## 2022-02-10 DIAGNOSIS — G894 Chronic pain syndrome: Secondary | ICD-10-CM | POA: Diagnosis not present

## 2022-02-10 DIAGNOSIS — G8929 Other chronic pain: Secondary | ICD-10-CM | POA: Diagnosis not present

## 2022-02-10 MED ORDER — HYDROCODONE-ACETAMINOPHEN 5-325 MG PO TABS: 1.0000 | ORAL_TABLET | Freq: Three times a day (TID) | ORAL | 0 refills | Status: DC | PRN

## 2022-02-10 NOTE — Telephone Encounter (Signed)
Spoke with the pt  He is c/o increased SOB and cough with yellow sputum- started after finished doxy and pred approx 1 wk ago  I have scheduled him to see Dr [place] Bells for acute visit tomorrow at 1:30- advised arrive at 1:15 and seek emergency care if needed

## 2022-02-10 NOTE — Progress Notes (Signed)
Subjective:    Patient ID: Thomas Medici., male    DOB: 08/26/1955, 66 y.o.   MRN: [id number]  HPI: Thomas Sandoval. is a 66 y.o. male who returns for follow up appointment for chronic pain and medication refill. He states his pain is located in his lower back.Marland Kitchen He rates his pain 6. His current exercise regime is walking  short distances.   Dr Tressa Busman note was reviewed. UDS ordered, Thomas Sandoval reports he is unable to walk to [employer] due to his breathing, he is on Continuous oxygen at 3 Liters nasal cannula. Oral Swab was Ordered.   Thomas Sandoval: states he is not drinking any alcohol.   Wife in room, all questions answered.   Pain Inventory Average Pain 6 Pain Right Now 6 My pain is constant, dull, and aching  In the last 24 hours, has pain interfered with the following? General activity 6 Relation with others 0 Enjoyment of life 0 What TIME of day is your pain at its worst? morning , daytime, evening, and night Sleep (in general) Poor  Pain is worse with: walking, bending, and standing Pain improves with: rest and medication Relief from Meds: 5  Family History  Problem Relation Age of Onset   Breast cancer Mother    Cancer Mother        Breast   Rheumatic fever Father    Heart disease Father    Heart attack Father        Massive    Diabetes Son    Social History   Socioeconomic History   Marital status: Married    Spouse name: Not on file   Number of children: Not on file   Years of education: Not on file   Highest education level: Not on file  Occupational History   Occupation: Tobacco Psychologist, sport and exercise  Tobacco Use   Smoking status: Every Day    Packs/day: 2.00    Years: 30.00    Total pack years: 60.00    Types: Cigarettes   Smokeless tobacco: Former    Quit date: 06/08/1978   Tobacco comments:    3/4 of a pack of cigarettes smoked daily ARJ 01/28/22  Vaping Use   Vaping Use: Never used  Substance and Sexual Activity   Alcohol use: Yes    Comment:  5 quarts per week 12/26/21   Drug use: No   Sexual activity: Not on file  Other Topics Concern   Not on file  Social History Narrative   Lives in [place] with wife and 2 sons.    Social Determinants of Health   Financial Resource Strain: Not on file  Food Insecurity: Unknown (11/27/2021)   Hunger Vital Sign    Worried About Running Out of Food in the Last Year: Patient refused    [place] in the Last Year: Patient refused  Transportation Needs: Unknown (11/27/2021)   PRAPARE - Hydrologist (Medical): Patient refused    Lack of Transportation (Non-Medical): Patient refused  Physical Activity: Not on file  Stress: Not on file  Social Connections: Not on file   Past Surgical History:  Procedure Laterality Date   CARDIOVERSION N/A 01/20/2021   Procedure: CARDIOVERSION;  Surgeon: Fay Records, MD;  Location: [place];  Service: Cardiovascular;  Laterality: N/A;   CYSTOSCOPY WITH URETEROSCOPY Right 08/14/2013   Procedure: CYSTOSCOPY WITH URETEROSCOPY BLADDER BIOPSY ;  Surgeon: Claybon Jabs, MD;  Location: [facility];  Service: Urology;  Laterality: Right;   ESOPHAGOGASTRODUODENOSCOPY N/A 02/06/2013   Procedure: ESOPHAGOGASTRODUODENOSCOPY (EGD);  Surgeon: Lafayette Dragon, MD;  Location: [facility] ENDOSCOPY;  Service: Endoscopy;  Laterality: N/A;   LAPAROSCOPY N/A 11/25/2014   Procedure: LAPAROSCOPIC PRIMARY REPAIR OF PERFORATED PREPYLORIC ULCER WITH Silvestre Gunner;  Surgeon: Greer Pickerel, MD;  Location: [place];  Service: General;  Laterality: N/A;   TONSILLECTOMY  as child   TRANSTHORACIC ECHOCARDIOGRAM  02-17-2011   MODERATE LVH/  EF 65%   TRANSURETHRAL RESECTION OF BLADDER TUMOR WITH GYRUS (TURBT-GYRUS) N/A 06/12/2013   Procedure: TRANSURETHRAL RESECTION OF BLADDER TUMOR WITH GYRUS (TURBT-GYRUS);  Surgeon: Claybon Jabs, MD;  Location: [facility];  Service: Urology;  Laterality: N/A;   TYMPANIC MEMBRANE REPAIR  as child   Past  Surgical History:  Procedure Laterality Date   CARDIOVERSION N/A 01/20/2021   Procedure: CARDIOVERSION;  Surgeon: Fay Records, MD;  Location: [place];  Service: Cardiovascular;  Laterality: N/A;   CYSTOSCOPY WITH URETEROSCOPY Right 08/14/2013   Procedure: CYSTOSCOPY WITH URETEROSCOPY BLADDER BIOPSY ;  Surgeon: Claybon Jabs, MD;  Location: [facility];  Service: Urology;  Laterality: Right;   ESOPHAGOGASTRODUODENOSCOPY N/A 02/06/2013   Procedure: ESOPHAGOGASTRODUODENOSCOPY (EGD);  Surgeon: Lafayette Dragon, MD;  Location[facility] END[facility]COPY;  Service: Endoscopy;  Laterality: N/A;   LAPAROSCOPY N/A 11/25/2014   Procedure: LAPAROSCOPIC PRIMARY REPAIR OF PERFORATED PREPYLORIC ULCER WITH Silvestre Gunner;  Surgeon: Greer Pickerel, MD;  Location: [place];  Service: General;  Laterality: N/A;   TONSILLECTOMY  as child   TRANSTHORACIC ECHOCARDIOGRAM  02-17-2011   MODERATE LVH/  EF 65%   TRANSURETHRAL RESECTION OF BLADDER TUMOR WITH GYRUS (TURBT-GYRUS) N/A 06/12/2013   Procedure: TRANSURETHRAL RESECTION OF BLADDER TUMOR WITH GYRUS (TURBT-GYRUS);  Surgeon: Claybon Jabs, MD;  Location[facility];  Service: Urology;  Laterality: N/A;   TYMPANIC MEMBRANE REPAIR  as child   Past Medical History:  Diagnosis Date   Arthritis    Bladder cancer ([place])    Chronic back pain    Chronic combined systolic and diastolic CHF (congestive heart failure) ([place]) 07/16/2021   COPD (chronic obstructive pulmonary disease) (HCC)    DDD (degenerative disc disease)    Diabetic retinopathy of both eyes (HCC)    Essential hypertension    GERD (gastroesophageal reflux disease)    History of atrial flutter 02/2011   Converted to NSR with Cardizem   History of chronic bronchitis    History of hemolytic anemia 02/2011   secondary to Avelox   HOH (hard of hearing)    HOH (hard of hearing)    no eardrum and nerve damage on R, also HOH on L   Mitral valve prolapse    a. 2D Echo 11/27/14: EF 55-60%; images  were inadequate for LV wall motion assessment, + mild late systolic mitral valve prolapse involving the anterior leaflet.   PAD (peripheral artery disease) ([place]) 04/2014   Dr Trula Slade; bilateral SFA occlusion, R mid, L distal   PAF (paroxysmal atrial fibrillation) ([place])    a. Dx 11/2014 during admission for perf ulcer.   Perforated ulcer ([place])    a. 11/2014 s/p surgery.   Productive cough    Smokers' cough ([place])    Type 1 diabetes mellitus ([place]) 1977   There were no vitals taken for this visit.  Opioid Risk Score:   Fall Risk Score:  `1  Depression screen [facility] 2/9     02/02/2022  2:35 PM 11/17/2021    9:30 AM 04/28/2021    2:17 PM 01/24/2021    2:25 PM 12/17/2020    2:28 PM 10/25/2020    2:45 PM 05/01/2020    2:23 PM  Depression screen PHQ 2/9  Decreased Interest 0 0 0 0 0 0 0  Down, Depressed, Hopeless 0 0 0 0 0 0 0  PHQ - 2 Score 0 0 0 0 0 0 0  Altered sleeping 0 1 0  0 0   Tired, decreased energy 0 1 0  0 0   Change in appetite 0 0 0  0 0   Feeling bad or failure about yourself  0 0 0  0 0   Trouble concentrating 0 0 0  0 0   Moving slowly or fidgety/restless 0 0 0  0 0   Suicidal thoughts 0 0 0  0 0   PHQ-9 Score 0 2 0  0 0   Difficult doing work/chores Not difficult at all  Not difficult at all   Not difficult at all     Review of Systems  Respiratory:  Positive for shortness of breath.   Musculoskeletal:  Positive for back pain.  All other systems reviewed and are negative.      Objective:   Physical Exam Vitals and nursing note reviewed.  Constitutional:      Appearance: Normal appearance.  Cardiovascular:     Rate and Rhythm: Normal rate. Rhythm irregular.     Pulses: Normal pulses.     Heart sounds: Normal heart sounds.  Pulmonary:     Breath sounds: Wheezing and rhonchi present.     Comments: Continuous Oxygen @ 3 liters Nasal Cannula  Musculoskeletal:     Cervical back: Normal range of motion and neck supple.     Comments: Normal Muscle Bulk and  Muscle Testing Reveals:  Upper Extremities: Full ROM and Muscle Strength 5/5  Lumbar Paraspinal  Tenderness: L-4-L-5 Lower Extremities: Full ROM and Muscle Strength 5/5 Arises from Table slowly Narrow Based  Gait     Skin:    General: Skin is warm and dry.  Neurological:     Mental Status: He is alert and oriented to person, place, and time.  Psychiatric:        Mood and Affect: Mood normal.        Behavior: Behavior normal.         Assessment & Plan:  Chronic Bilateral Low Back Pain without Sciatica: He has a scheduled appointment with Dr Letta Pate on 03/03/2022 to discuss injection. Continue HEP as Tolerated. Continue to Monitor.  2. ETOH Abuse: Thomas Sandoval states he's not drinking any alcohol. Oral Swab was Performed today.  3. Chronic Pain Syndrome: Refilled: Hydrocodone 5/325 mg one tablet every 8 hours as needed for pain #90. We will continue the opioid monitoring program, this consists of regular clinic visits, examinations, urine drug screen, pill counts as well as use of [place] Controlled Substance Reporting system. A 12 month History has been reviewed on the [place] Controlled Substance Reporting System  02/10/2022.   F/U in 1 month

## 2022-02-10 NOTE — Progress Notes (Deleted)
   Subjective:    Patient ID: Thomas Sandoval., male    DOB: September 01, 1955, 66 y.o.   MRN: [id number]  HPI   .cpr Review of Systems     Objective:   Physical Exam        Assessment & Plan:

## 2022-02-11 ENCOUNTER — Other Ambulatory Visit (HOSPITAL_COMMUNITY): Payer: Self-pay | Admitting: *Deleted

## 2022-02-11 ENCOUNTER — Telehealth: Payer: Self-pay | Admitting: Pharmacist

## 2022-02-11 ENCOUNTER — Encounter (HOSPITAL_COMMUNITY): Payer: Self-pay | Admitting: Physician Assistant

## 2022-02-11 ENCOUNTER — Ambulatory Visit (HOSPITAL_COMMUNITY)
Admission: RE | Admit: 2022-02-11 | Discharge: 2022-02-11 | Disposition: A | Discharge status: Home / Self Care | Payer: BC Managed Care – PPO | Source: Ambulatory Visit | Attending: Physician Assistant | Admitting: Physician Assistant

## 2022-02-11 ENCOUNTER — Ambulatory Visit (INDEPENDENT_AMBULATORY_CARE_PROVIDER_SITE_OTHER): Payer: BC Managed Care – PPO | Admitting: Pulmonary Disease

## 2022-02-11 ENCOUNTER — Encounter: Payer: Self-pay | Admitting: Pulmonary Disease

## 2022-02-11 VITALS — BP 140/74 | HR 92 | Temp 97.7°F | Ht 68.0 in | Wt 191.8 lb

## 2022-02-11 VITALS — BP 122/74 | HR 85 | Ht 69.0 in | Wt 192.8 lb

## 2022-02-11 DIAGNOSIS — Z79899 Other long term (current) drug therapy: Secondary | ICD-10-CM | POA: Diagnosis not present

## 2022-02-11 DIAGNOSIS — Z794 Long term (current) use of insulin: Secondary | ICD-10-CM | POA: Insufficient documentation

## 2022-02-11 DIAGNOSIS — Z7901 Long term (current) use of anticoagulants: Secondary | ICD-10-CM | POA: Insufficient documentation

## 2022-02-11 DIAGNOSIS — I4819 Other persistent atrial fibrillation: Secondary | ICD-10-CM | POA: Insufficient documentation

## 2022-02-11 DIAGNOSIS — R0602 Shortness of breath: Secondary | ICD-10-CM | POA: Insufficient documentation

## 2022-02-11 DIAGNOSIS — E119 Type 2 diabetes mellitus without complications: Secondary | ICD-10-CM | POA: Diagnosis not present

## 2022-02-11 DIAGNOSIS — R600 Localized edema: Secondary | ICD-10-CM | POA: Insufficient documentation

## 2022-02-11 DIAGNOSIS — Z7984 Long term (current) use of oral hypoglycemic drugs: Secondary | ICD-10-CM | POA: Insufficient documentation

## 2022-02-11 DIAGNOSIS — K852 Alcohol induced acute pancreatitis without necrosis or infection: Secondary | ICD-10-CM | POA: Insufficient documentation

## 2022-02-11 DIAGNOSIS — J449 Chronic obstructive pulmonary disease, unspecified: Secondary | ICD-10-CM | POA: Diagnosis not present

## 2022-02-11 DIAGNOSIS — K219 Gastro-esophageal reflux disease without esophagitis: Secondary | ICD-10-CM | POA: Insufficient documentation

## 2022-02-11 DIAGNOSIS — I110 Hypertensive heart disease with heart failure: Secondary | ICD-10-CM | POA: Diagnosis not present

## 2022-02-11 DIAGNOSIS — J441 Chronic obstructive pulmonary disease with (acute) exacerbation: Secondary | ICD-10-CM

## 2022-02-11 DIAGNOSIS — J439 Emphysema, unspecified: Secondary | ICD-10-CM

## 2022-02-11 DIAGNOSIS — Z720 Tobacco use: Secondary | ICD-10-CM

## 2022-02-11 DIAGNOSIS — K255 Chronic or unspecified gastric ulcer with perforation: Secondary | ICD-10-CM | POA: Diagnosis not present

## 2022-02-11 DIAGNOSIS — I4892 Unspecified atrial flutter: Secondary | ICD-10-CM | POA: Diagnosis not present

## 2022-02-11 DIAGNOSIS — I739 Peripheral vascular disease, unspecified: Secondary | ICD-10-CM | POA: Insufficient documentation

## 2022-02-11 DIAGNOSIS — J4489 Other specified chronic obstructive pulmonary disease: Secondary | ICD-10-CM | POA: Diagnosis not present

## 2022-02-11 DIAGNOSIS — D6869 Other thrombophilia: Secondary | ICD-10-CM | POA: Diagnosis not present

## 2022-02-11 DIAGNOSIS — I4891 Unspecified atrial fibrillation: Secondary | ICD-10-CM | POA: Diagnosis not present

## 2022-02-11 DIAGNOSIS — I5022 Chronic systolic (congestive) heart failure: Secondary | ICD-10-CM | POA: Insufficient documentation

## 2022-02-11 LAB — BASIC METABOLIC PANEL
Anion gap: 11 (ref 5–15)
BUN: 14 mg/dL (ref 8–23)
CO2: 28 mmol/L (ref 22–32)
Calcium: 9.4 mg/dL (ref 8.9–10.3)
Chloride: 98 mmol/L (ref 98–111)
Creatinine, Ser: 1.07 mg/dL (ref 0.61–1.24)
GFR, Estimated: >60 mL/min (ref >60)
Glucose, Bld: 34 mg/dL — CL (ref 70–99)
Potassium: 3.1 mmol/L — ABNORMAL LOW (ref 3.5–5.1)
Sodium: 137 mmol/L (ref 135–145)

## 2022-02-11 LAB — BRAIN NATRIURETIC PEPTIDE: B Natriuretic Peptide: 886.2 pg/mL — ABNORMAL HIGH (ref 0.0–100.0)

## 2022-02-11 LAB — MAGNESIUM: Magnesium: 2.3 mg/dL (ref 1.7–2.4)

## 2022-02-11 MED ORDER — AMOXICILLIN-POT CLAVULANATE 875-125 MG PO TABS: 1.0000 | ORAL_TABLET | Freq: Two times a day (BID) | ORAL | 0 refills | Status: DC

## 2022-02-11 MED ORDER — POTASSIUM CHLORIDE CRYS ER 20 MEQ PO TBCR: EXTENDED_RELEASE_TABLET | ORAL | 0 refills | Status: DC

## 2022-02-11 MED ORDER — PREDNISONE 20 MG PO TABS: 20.0000 mg | ORAL_TABLET | Freq: Every day | ORAL | 0 refills | Status: DC

## 2022-02-11 NOTE — Addendum Note (Signed)
Addended by: Loma Sousa on: 02/11/2022 02:11 PM   Modules accepted: Orders

## 2022-02-11 NOTE — Progress Notes (Signed)
Synopsis: Patient of Dr. Lamonte Sakai with COPD, pulmonary nodule, still smoking as of 2023.  Subjective:   PATIENT ID: Thomas Sandoval. GENDER: male DOB: 06/07/1955, MRN: [id number]   HPI  Chief Complaint  Patient presents with   Follow-up    Cough, sob   Harlie says that he is coughing up mucus still despite the recent antibiotics He says that the recent antibiotics helped but it got worse again He has post nasal drip and a sore throat that makes him cough a lot He has been having a lot of sinus congestion His heart has been out of rhythm lately.  They are thinking about putting him on Tikosyn.   Record review from his visit with Dr. Lamonte Sakai from January 28, 2022 where he was seen in the context of a COPD exacerbation and treated with doxycycline and prednisone.  Smoking cessation was encouraged.  There is a pulmonary nodule noted on imaging which was going to be followed with repeat CT scanning of the chest in 3 months.  Past Medical History:  Diagnosis Date   Arthritis    Bladder cancer ([place])    Chronic back pain    Chronic combined systolic and diastolic CHF (congestive heart failure) ([place]) 07/16/2021   COPD (chronic obstructive pulmonary disease) (HCC)    DDD (degenerative disc disease)    Diabetic retinopathy of both eyes (HCC)    Essential hypertension    GERD (gastroesophageal reflux disease)    History of atrial flutter 02/2011   Converted to NSR with Cardizem   History of chronic bronchitis    History of hemolytic anemia 02/2011   secondary to Avelox   HOH (hard of hearing)    HOH (hard of hearing)    no eardrum and nerve damage on R, also HOH on L   Mitral valve prolapse    a. 2D Echo 11/27/14: EF 55-60%; images were inadequate for LV wall motion assessment, + mild late systolic mitral valve prolapse involving the anterior leaflet.   PAD (peripheral artery disease) ([place]) 04/2014   Dr Trula Slade; bilateral SFA occlusion, R mid, L distal   PAF (paroxysmal atrial  fibrillation) ([place])    a. Dx 11/2014 during admission for perf ulcer.   Perforated ulcer ([place])    a. 11/2014 s/p surgery.   Productive cough    Smokers' cough ([place])    Type 1 diabetes mellitus ([place]) 1977     Family History  Problem Relation Age of Onset   Breast cancer Mother    Cancer Mother        Breast   Rheumatic fever Father    Heart disease Father    Heart attack Father        Massive    Diabetes Son      Social History   Socioeconomic History   Marital status: Married    Spouse name: Not on file   Number of children: Not on file   Years of education: Not on file   Highest education level: Not on file  Occupational History   Occupation: Tobacco Farmer  Tobacco Use   Smoking status: Every Day    Packs/day: 2.00    Years: 30.00    Total pack years: 60.00    Types: Cigarettes   Smokeless tobacco: Former    Quit date: 06/08/1978   Tobacco comments:    3/4 of a pack of cigarettes smoked daily ARJ 01/28/22  Vaping Use   Vaping Use: Never used  Substance and  Sexual Activity   Alcohol use: Yes    Comment: 5 quarts per week 12/26/21   Drug use: No   Sexual activity: Not on file  Other Topics Concern   Not on file  Social History Narrative   Lives in [place] with wife and 2 sons.    Social Determinants of Health   Financial Resource Strain: Not on file  Food Insecurity: Unknown (11/27/2021)   Hunger Vital Sign    Worried About Running Out of Food in the Last Year: Patient refused    [place] in the Last Year: Patient refused  Transportation Needs: Unknown (11/27/2021)   PRAPARE - Hydrologist (Medical): Patient refused    Lack of Transportation (Non-Medical): Patient refused  Physical Activity: Not on file  Stress: Not on file  Social Connections: Not on file  Intimate Partner Violence: Unknown (11/27/2021)   Humiliation, Afraid, Rape, and Kick questionnaire    Fear of Current or Ex-Partner: Patient refused    Emotionally  Abused: Patient refused    Physically Abused: Patient refused    Sexually Abused: Patient refused     Allergies  Allergen Reactions   Azithromycin Other (See Comments) and Nausea And Vomiting    "Severe stomach cramps; told to list as an allergy by dr. Huel Cote ago"   Avelox [Moxifloxacin Hcl In Nacl] Other (See Comments)    Hemolysis  In 2012   Bactrim [Sulfamethoxazole-Trimethoprim] Diarrhea and Nausea And Vomiting   Moxifloxacin Other (See Comments)   Sulfamethoxazole-Trimethoprim Other (See Comments)     Outpatient Medications Prior to Visit  Medication Sig Dispense Refill   albuterol (VENTOLIN HFA) 108 (90 Base) MCG/ACT inhaler Inhale 2 puffs into the lungs every 6 (six) hours as needed for wheezing or shortness of breath. 18 g 2   cetirizine (ZYRTEC) 10 MG tablet Take 10 mg by mouth daily.     doxycycline (VIBRA-TABS) 100 MG tablet Take 1 tablet (100 mg total) by mouth 2 (two) times daily. (Patient not taking: Reported on 02/11/2022) 14 tablet 0   empagliflozin (JARDIANCE) 10 MG TABS tablet Take 1 tablet (10 mg total) by mouth daily before breakfast. 30 tablet 11   famotidine (PEPCID) 20 MG tablet Take 1 tablet (20 mg total) by mouth every morning. Reported on 04/16/2015 90 tablet 1   furosemide (LASIX) 40 MG tablet Take 1 tablet (40 mg total) by mouth daily. 90 tablet 3   HYDROcodone-acetaminophen (NORCO/VICODIN) 5-325 MG tablet Take 1 tablet by mouth 3 (three) times daily as needed for severe pain. 90 tablet 0   insulin aspart (NOVOLOG) 100 UNIT/ML injection Per sliding scale 190-200=2u. 300 and above 7u 10 mL 1   insulin glargine (LANTUS) 100 UNIT/ML injection Inject 0.2 mLs (20 Units total) into the skin daily. 30 mL 5   Insulin Syringe-Needle U-100 (B-D INS SYR ULTRAFINE 1CC/30G) 30G X 1/2" 1 ML MISC Use 4 times a day with insulin Dx E11.9 400 each 3   Insulin Syringes, Disposable, U-100 1 ML MISC Use 4 times a day for insulin injection Dx E11.9 400 each 5   ipratropium-albuterol  (DUONEB) 0.5-2.5 (3) MG/3ML SOLN Take 3 mLs by nebulization every 4 (four) hours as needed. 360 mL 0   levalbuterol (XOPENEX) 0.31 MG/3ML nebulizer solution Take 3 mLs (0.31 mg total) by nebulization every 4 (four) hours as needed for wheezing. 3 mL 12   losartan (COZAAR) 50 MG tablet Take 1 tablet (50 mg total) by mouth at bedtime.  hold this medication until follow up 90 tablet 1   metoprolol succinate (TOPROL-XL) 100 MG 24 hr tablet Take 1 tablet (100 mg total) by mouth daily. Take with or immediately following a meal. 90 tablet 1   naloxone (NARCAN) nasal spray 4 mg/0.1 mL Place 1 spray into the nose as needed (For opiate overdose). 1 each 2   OVER THE COUNTER MEDICATION Take 1 Scoop by mouth in the morning and at bedtime. Super Beets     predniSONE (DELTASONE) 10 MG tablet Take 4 tablets X 3 days, 3 tabs X 3 days, 2 tabs x 3 days, 1 tab x 3 days 30 tablet 0   rivaroxaban (XARELTO) 20 MG TABS tablet Take 1 tablet (20 mg total) by mouth daily with supper. 30 tablet 5   TRELEGY ELLIPTA 100-62.5-25 MCG/ACT AEPB Inhale 1 puff into the lungs daily.     No facility-administered medications prior to visit.    Review of Systems  Constitutional:  Negative for chills, fever, malaise/fatigue and weight loss.  HENT:  Negative for congestion, sinus pain and sore throat.   Respiratory:  Positive for cough, sputum production and shortness of breath.   Cardiovascular:  Negative for chest pain and leg swelling.      Objective:  Physical Exam   Vitals:   02/11/22 1324  BP: (!) 140/74  Pulse: 92  Temp: 97.7 F (36.5 C)  TempSrc: Oral  SpO2: 96%  Weight: 191 lb 12.8 oz (87 kg)  Height: '5\' 8"'$  (1.727 m)   3L [place]  General:  Resting comfortably in bed HENT: NCAT OP clear PULM: Rhonchi bilaterally, poor air movement, normal effort CV: Irreg irreg, no mgr GI: BS+, soft, nontender MSK: normal bulk and tone Neuro: awake, alert, no distress, MAEW   CBC    Component Value Date/Time   WBC 11.2  (H) 02/02/2022 1426   WBC 10.8 (H) 11/27/2021 1045   RBC 4.41 02/02/2022 1426   RBC 4.26 11/27/2021 1045   HGB 15.5 02/02/2022 1426   HCT 45.0 02/02/2022 1426   PLT 329 02/02/2022 1426   MCV 102 (H) 02/02/2022 1426   MCH 35.1 (H) 02/02/2022 1426   MCH 33.8 11/27/2021 1045   MCHC 34.4 02/02/2022 1426   MCHC 33.0 11/27/2021 1045   RDW 12.1 02/02/2022 1426   LYMPHSABS 1.5 02/02/2022 1426   MONOABS 1.1 (H) 11/27/2021 1045   EOSABS 0.0 02/02/2022 1426   BASOSABS 0.1 02/02/2022 1426     Chest imaging: November 2023 CT chest images independently reviewed showing some paraseptal emphysema, multiple pulmonary nodules left apex, right upper lobe, some bronchiectasis right base with mucous plugging, mild upper lobe predominant centrilobular emphysema  PFT:  Labs:  Path:  Echo:  Heart Catheterization:       Assessment & Plan:   COPD with chronic bronchitis and emphysema (HCC)  Tobacco use  Atrial fibrillation, unspecified type (HCC)  COPD with acute exacerbation ([place])  Discussion: This is a pleasant 66 year old male with COPD exacerbation in the face of ongoing cigarette smoking.  I worry about the possibility of chronic respiratory infections given his occupational exposure and nodules.  Plan: COPD with acute exacerbation, recurrent Stop smoking Take Augmentin 875 twice daily no matter how you feel Provide [place] with a sample of your mucus so that we can test it for bacterial, fungal, AFB organisms Keep taking your inhaled medicines as you are doing If shortness of breath worsens take prednisone 20 mg daily x 5 days In the future we  may need to consider a daily antibiotic to prevent these episodes, however, we need to be careful with that with your atrial fibrillation  Chronic respiratory failure with hypoxemia: Keep using 3 L of oxygen continuously  Atrial fibrillation: Keep follow-up with cardiology  Keep follow-up with Dr. Lamonte Sakai as previously arranged, call us if  no improvement or if worse.  Immunizations: Immunization History  Administered Date(s) Administered   Tdap 07/26/2021     Current Outpatient Medications:    albuterol (VENTOLIN HFA) 108 (90 Base) MCG/ACT inhaler, Inhale 2 puffs into the lungs every 6 (six) hours as needed for wheezing or shortness of breath., Disp: 18 g, Rfl: 2   cetirizine (ZYRTEC) 10 MG tablet, Take 10 mg by mouth daily., Disp: , Rfl:    doxycycline (VIBRA-TABS) 100 MG tablet, Take 1 tablet (100 mg total) by mouth 2 (two) times daily. (Patient not taking: Reported on 02/11/2022), Disp: 14 tablet, Rfl: 0   empagliflozin (JARDIANCE) 10 MG TABS tablet, Take 1 tablet (10 mg total) by mouth daily before breakfast., Disp: 30 tablet, Rfl: 11   famotidine (PEPCID) 20 MG tablet, Take 1 tablet (20 mg total) by mouth every morning. Reported on 04/16/2015, Disp: 90 tablet, Rfl: 1   furosemide (LASIX) 40 MG tablet, Take 1 tablet (40 mg total) by mouth daily., Disp: 90 tablet, Rfl: 3   HYDROcodone-acetaminophen (NORCO/VICODIN) 5-325 MG tablet, Take 1 tablet by mouth 3 (three) times daily as needed for severe pain., Disp: 90 tablet, Rfl: 0   insulin aspart (NOVOLOG) 100 UNIT/ML injection, Per sliding scale 190-200=2u. 300 and above 7u, Disp: 10 mL, Rfl: 1   insulin glargine (LANTUS) 100 UNIT/ML injection, Inject 0.2 mLs (20 Units total) into the skin daily., Disp: 30 mL, Rfl: 5   Insulin Syringe-Needle U-100 (B-D INS SYR ULTRAFINE 1CC/30G) 30G X 1/2" 1 ML MISC, Use 4 times a day with insulin Dx E11.9, Disp: 400 each, Rfl: 3   Insulin Syringes, Disposable, U-100 1 ML MISC, Use 4 times a day for insulin injection Dx E11.9, Disp: 400 each, Rfl: 5   ipratropium-albuterol (DUONEB) 0.5-2.5 (3) MG/3ML SOLN, Take 3 mLs by nebulization every 4 (four) hours as needed., Disp: 360 mL, Rfl: 0   levalbuterol (XOPENEX) 0.31 MG/3ML nebulizer solution, Take 3 mLs (0.31 mg total) by nebulization every 4 (four) hours as needed for wheezing., Disp: 3 mL, Rfl:  12   losartan (COZAAR) 50 MG tablet, Take 1 tablet (50 mg total) by mouth at bedtime. hold this medication until follow up, Disp: 90 tablet, Rfl: 1   metoprolol succinate (TOPROL-XL) 100 MG 24 hr tablet, Take 1 tablet (100 mg total) by mouth daily. Take with or immediately following a meal., Disp: 90 tablet, Rfl: 1   naloxone (NARCAN) nasal spray 4 mg/0.1 mL, Place 1 spray into the nose as needed (For opiate overdose)., Disp: 1 each, Rfl: 2   OVER THE COUNTER MEDICATION, Take 1 Scoop by mouth in the morning and at bedtime. Super Beets, Disp: , Rfl:    predniSONE (DELTASONE) 10 MG tablet, Take 4 tablets X 3 days, 3 tabs X 3 days, 2 tabs x 3 days, 1 tab x 3 days, Disp: 30 tablet, Rfl: 0   rivaroxaban (XARELTO) 20 MG TABS tablet, Take 1 tablet (20 mg total) by mouth daily with supper., Disp: 30 tablet, Rfl: 5   TRELEGY ELLIPTA 100-62.5-25 MCG/ACT AEPB, Inhale 1 puff into the lungs daily., Disp: , Rfl:

## 2022-02-11 NOTE — Patient Instructions (Signed)
COPD with acute exacerbation, recurrent Stop smoking Take Augmentin 875 twice daily no matter how you feel Provide [place] with a sample of your mucus so that we can test it for bacterial, fungal, AFB organisms Keep taking your inhaled medicines as you are doing If shortness of breath worsens take prednisone 20 mg daily x 5 days In the future we may need to consider a daily antibiotic to prevent these episodes, however, we need to be careful with that with your atrial fibrillation  Chronic respiratory failure with hypoxemia: Keep using 3 L of oxygen continuously  Atrial fibrillation: Keep follow-up with cardiology  Keep follow-up with Dr. Lamonte Sakai as previously arranged, call us if no improvement or if worse.

## 2022-02-11 NOTE — Telephone Encounter (Signed)
Medication list reviewed in anticipation of upcoming Tikosyn initiation. Patient is on multiple inhalers/nebulizers that are QTc prolonging including albuterol, Duonebs, Xopenex, and Trelegy. Ok to continue with Tikosyn but will need to monitor QTc closely.   Patient is anticoagulated on Xarelto '20mg'$  daily on the appropriate dose. Please ensure that patient has not missed any anticoagulation doses in the 3 weeks prior to Tikosyn initiation.   Patient will need to be counseled to avoid use of Benadryl while on Tikosyn and in the 2-3 days prior to Tikosyn initiation.

## 2022-02-11 NOTE — Addendum Note (Signed)
Addended by: Loma Sousa on: 02/11/2022 02:08 PM   Modules accepted: Orders

## 2022-02-11 NOTE — Progress Notes (Signed)
Primary Care Physician: Chevis Pretty, FNP Primary Cardiologist: Dr Gardiner Rhyme Primary Electrophysiologist: Dr Quentin Ore Referring Physician: Darla Lesches NP   Thomas Medici. is a 66 y.o. male with a history of alcohol abuse, tobacco abuse, COPD, MVP, perforated gastric ulcer s/p repair 2016, DM, HTN, PAD (bilateral fem-pop occlusion), GERD, systolic CHF, atrial flutter, atrial fibrillation who presents for follow up in the [facility]. The patient was initially diagnosed with atrial flutter in 2012 and afib in the setting of his perforated ulcer. Patient has a CHADS2VASC score of 4, has declined anticoagulation in the past. He was hospitalized 9/26-9/30/22 with acute alcoholic pancreatitis and was also found to be in afib at the time. Echo showed EF 30-35%. He left the hospital AMA. At his visit with his PCP 12/17/20 he was still in afib and was started on diltiazem and Xarelto. Patient is s/p DCCV on 01/20/21 but unfortunately was back in afib by follow up on 01/24/21. He did not feel any different in SR.   Patient seen by Dr Quentin Ore 03/26/21 and discussed ablation but patient deferred at that time. He was admitted 07/2021 for community acquired pneumonia and rapid atrial flutter. He remained in atrial flutter at his visit with Dr Gardiner Rhyme on 08/20/21.  Patient was admitted 11/2021 after being found unresponsive by family. They described possible seizure-like activity. It is unclear whether he had witnessed syncope.  There is no report of prodromal chest pain or shortness of breath.  On EMS arrival he was unresponsive, was hypoglycemic (fingerstick blood sugar 48) and severely hypotensive (60/40 mmHg).  He received a liter of fluid, Narcan (without improvement) and intravenous dextrose.  His glucose increased to over 200 but he remained unresponsive.  His ECG shows diffuse ST segment depression in all leads except aVR where there is mild ST segment elevation.  Symptoms felt to be related to hypoglycemia. Ischemic workup was not pursued at that time. He was in afib but converted to SR prior to discharge. Patient was scheduled to have cardiac CT but was back in afib at the time and the scan was cancelled.   On follow up today, patient reports that he has felt more SOB lately, especially with activity. He has noted an increase in weight and lower extremity edema. He has been on prednisone and doxycycline recently for an URI per his pulmonologist. He remains in rate controlled afib today.   Today, he denies symptoms of palpitations, chest pain, orthopnea, PND,  dizziness, presyncope, syncope, snoring, daytime somnolence, bleeding, or neurologic sequela. The patient is tolerating medications without difficulties and is otherwise without complaint today.    Atrial Fibrillation Risk Factors:  he does not have symptoms or diagnosis of sleep apnea. he does not have a history of rheumatic fever. he does have a history of alcohol use. The patient does not have a history of early familial atrial fibrillation or other arrhythmias.  he has a BMI of Body mass index is 28.47 kg/m.Marland Kitchen Filed Weights   02/11/22 1137  Weight: 87.5 kg    Family History  Problem Relation Age of Onset   Breast cancer Mother    Cancer Mother        Breast   Rheumatic fever Father    Heart disease Father    Heart attack Father        Massive    Diabetes Son     Atrial Fibrillation Management history:  Previous antiarrhythmic drugs: none Previous cardioversions: 08/25/17, 01/20/21 Previous  ablations: none CHADS2VASC score: 4 Anticoagulation history: Xarelto    Past Medical History:  Diagnosis Date   Arthritis    Bladder cancer ([place])    Chronic back pain    Chronic combined systolic and diastolic CHF (congestive heart failure) ([place]) 07/16/2021   COPD (chronic obstructive pulmonary disease) (HCC)    DDD (degenerative disc disease)    Diabetic retinopathy of both eyes  (HCC)    Essential hypertension    GERD (gastroesophageal reflux disease)    History of atrial flutter 02/2011   Converted to NSR with Cardizem   History of chronic bronchitis    History of hemolytic anemia 02/2011   secondary to Avelox   HOH (hard of hearing)    HOH (hard of hearing)    no eardrum and nerve damage on R, also HOH on L   Mitral valve prolapse    a. 2D Echo 11/27/14: EF 55-60%; images were inadequate for LV wall motion assessment, + mild late systolic mitral valve prolapse involving the anterior leaflet.   PAD (peripheral artery disease) ([place]) 04/2014   Dr Trula Slade; bilateral SFA occlusion, R mid, L distal   PAF (paroxysmal atrial fibrillation) ([place])    a. Dx 11/2014 during admission for perf ulcer.   Perforated ulcer ([place])    a. 11/2014 s/p surgery.   Productive cough    Smokers' cough ([place])    Type 1 diabetes mellitus ([place]) 1977   Past Surgical History:  Procedure Laterality Date   CARDIOVERSION N/A 01/20/2021   Procedure: CARDIOVERSION;  Surgeon: Fay Records, MD;  Location: [facility];  Service: Cardiovascular;  Laterality: N/A;   CYSTOSCOPY WITH URETEROSCOPY Right 08/14/2013   Procedure: CYSTOSCOPY WITH URETEROSCOPY BLADDER BIOPSY ;  Surgeon: Claybon Jabs, MD;  Location[facility];  Service: Urology;  Laterality: Right;   ESOPHAGOGASTRODUODENOSCOPY N/A 02/06/2013   Procedure: ESOPHAGOGASTRODUODENOSCOPY (EGD);  Surgeon: Lafayette Dragon, MD;  Location: [facility] END[facility]COPY;  Service: Endoscopy;  Laterality: N/A;   LAPAROSCOPY N/A 11/25/2014   Procedure: LAPAROSCOPIC PRIMARY REPAIR OF PERFORATED PREPYLORIC ULCER WITH Silvestre Gunner;  Surgeon: Greer Pickerel, MD;  Location: [facility];  Service: General;  Laterality: N/A;   TONSILLECTOMY  as child   TRANSTHORACIC ECHOCARDIOGRAM  02-17-2011   MODERATE LVH/  EF 65%   TRANSURETHRAL RESECTION OF BLADDER TUMOR WITH GYRUS (TURBT-GYRUS) N/A 06/12/2013   Procedure: TRANSURETHRAL RESECTION OF BLADDER TUMOR WITH GYRUS  (TURBT-GYRUS);  Surgeon: Claybon Jabs, MD;  Location[facility];  Service: Urology;  Laterality: N/A;   TYMPANIC MEMBRANE REPAIR  as child    Current Outpatient Medications  Medication Sig Dispense Refill   albuterol (VENTOLIN HFA) 108 (90 Base) MCG/ACT inhaler Inhale 2 puffs into the lungs every 6 (six) hours as needed for wheezing or shortness of breath. 18 g 2   cetirizine (ZYRTEC) 10 MG tablet Take 10 mg by mouth daily.     doxycycline (VIBRA-TABS) 100 MG tablet Take 1 tablet (100 mg total) by mouth 2 (two) times daily. 14 tablet 0   empagliflozin (JARDIANCE) 10 MG TABS tablet Take 1 tablet (10 mg total) by mouth daily before breakfast. 30 tablet 11   famotidine (PEPCID) 20 MG tablet Take 1 tablet (20 mg total) by mouth every morning. Reported on 04/16/2015 90 tablet 1   furosemide (LASIX) 40 MG tablet Take 1 tablet (40 mg total) by mouth daily. 90 tablet 3   HYDROcodone-acetaminophen (NORCO/VICODIN) 5-325 MG tablet Take 1 tablet by mouth 3 (three) times daily  as needed for severe pain. 90 tablet 0   insulin aspart (NOVOLOG) 100 UNIT/ML injection Per sliding scale 190-200=2u. 300 and above 7u 10 mL 1   insulin glargine (LANTUS) 100 UNIT/ML injection Inject 0.2 mLs (20 Units total) into the skin daily. 30 mL 5   Insulin Syringe-Needle U-100 (B-D INS SYR ULTRAFINE 1CC/30G) 30G X 1/2" 1 ML MISC Use 4 times a day with insulin Dx E11.9 400 each 3   Insulin Syringes, Disposable, U-100 1 ML MISC Use 4 times a day for insulin injection Dx E11.9 400 each 5   ipratropium-albuterol (DUONEB) 0.5-2.5 (3) MG/3ML SOLN Take 3 mLs by nebulization every 4 (four) hours as needed. 360 mL 0   levalbuterol (XOPENEX) 0.31 MG/3ML nebulizer solution Take 3 mLs (0.31 mg total) by nebulization every 4 (four) hours as needed for wheezing. 3 mL 12   losartan (COZAAR) 50 MG tablet Take 1 tablet (50 mg total) by mouth at bedtime. hold this medication until follow up 90 tablet 1   metoprolol succinate  (TOPROL-XL) 100 MG 24 hr tablet Take 1 tablet (100 mg total) by mouth daily. Take with or immediately following a meal. 90 tablet 1   naloxone (NARCAN) nasal spray 4 mg/0.1 mL Place 1 spray into the nose as needed (For opiate overdose). 1 each 2   OVER THE COUNTER MEDICATION Take 1 Scoop by mouth in the morning and at bedtime. Super Beets     predniSONE (DELTASONE) 10 MG tablet Take 4 tablets X 3 days, 3 tabs X 3 days, 2 tabs x 3 days, 1 tab x 3 days 30 tablet 0   rivaroxaban (XARELTO) 20 MG TABS tablet Take 1 tablet (20 mg total) by mouth daily with supper. 30 tablet 5   TRELEGY ELLIPTA 100-62.5-25 MCG/ACT AEPB Inhale 1 puff into the lungs daily.     No current facility-administered medications for this encounter.    Allergies  Allergen Reactions   Azithromycin Other (See Comments) and Nausea And Vomiting    "Severe stomach cramps; told to list as an allergy by dr. Huel Cote ago"   Avelox [Moxifloxacin Hcl In Nacl] Other (See Comments)    Hemolysis  In 2012   Bactrim [Sulfamethoxazole-Trimethoprim] Diarrhea and Nausea And Vomiting   Moxifloxacin Other (See Comments)   Sulfamethoxazole-Trimethoprim Other (See Comments)    Social History   Socioeconomic History   Marital status: Married    Spouse name: Not on file   Number of children: Not on file   Years of education: Not on file   Highest education level: Not on file  Occupational History   Occupation: Tobacco Farmer  Tobacco Use   Smoking status: Every Day    Packs/day: 2.00    Years: 30.00    Total pack years: 60.00    Types: Cigarettes   Smokeless tobacco: Former    Quit date: 06/08/1978   Tobacco comments:    3/4 of a pack of cigarettes smoked daily [facility] 01/28/22  Vaping Use   Vaping Use: Never used  Substance and Sexual Activity   Alcohol use: Yes    Comment: 5 quarts per week 12/26/21   Drug use: No   Sexual activity: Not on file  Other Topics Concern   Not on file  Social History Narrative   Lives in [place] with  wife and 2 sons.    Social Determinants of Health   Financial Resource Strain: Not on file  Food Insecurity: Unknown (11/27/2021)   Hunger Vital Sign  Worried About Charity fundraiser in the Last Year: Patient refused    Arboriculturist in the Last Year: Patient refused  Transportation Needs: Unknown (11/27/2021)   PRAPARE - Hydrologist (Medical): Patient refused    Lack of Transportation (Non-Medical): Patient refused  Physical Activity: Not on file  Stress: Not on file  Social Connections: Not on file  Intimate Partner Violence: Unknown (11/27/2021)   Humiliation, Afraid, Rape, and Kick questionnaire    Fear of Current or Ex-Partner: Patient refused    Emotionally Abused: Patient refused    Physically Abused: Patient refused    Sexually Abused: Patient refused     ROS- All systems are reviewed and negative except as per the HPI above.  Physical Exam: Vitals:   02/11/22 1137  BP: 122/74  Pulse: 85  Weight: 87.5 kg  Height: '5\' 9"'$  (1.753 m)     GEN- The patient is a well appearing male, alert and oriented x 3 today.   HEENT-head normocephalic, atraumatic, sclera clear, conjunctiva pink, hearing intact, trachea midline. Lungs- Coarse breath sounds, on O2 nasal canula  Heart- irregular rate and rhythm, no murmurs, rubs or gallops  GI- soft, NT, ND, + BS Extremities- no clubbing, cyanosis, or edema, using compression stockings MS- no significant deformity or atrophy Skin- no rash or lesion Psych- euthymic mood, full affect Neuro- strength and sensation are intact   Wt Readings from Last 3 Encounters:  02/11/22 87.5 kg  02/10/22 88.5 kg  02/09/22 88.5 kg    EKG today demonstrates  Coarse afib Vent. rate 88 BPM PR interval 188 ms QRS duration 80 ms QT/QTcB 382/462 ms  Echo 05/21/21 demonstrated   1. Left ventricular ejection fraction, by estimation, is 55 to 60%. The  left ventricle has normal function. The left ventricle has no  regional  wall motion abnormalities. Left ventricular diastolic parameters are  consistent with Grade II diastolic dysfunction (pseudonormalization). Elevated left ventricular end-diastolic pressure.   2. Right ventricular systolic function is normal. The right ventricular  size is normal. There is normal pulmonary artery systolic pressure.   3. The mitral valve is normal in structure. No evidence of mitral valve  regurgitation. No evidence of mitral stenosis.   4. The aortic valve is tricuspid. Aortic valve regurgitation is not  visualized. No aortic stenosis is present.   5. The inferior vena cava is normal in size with greater than 50%  respiratory variability, suggesting right atrial pressure of 3 mmHg.   Epic records are reviewed at length today  CHA2DS2-VASc Score = 4  The patient's score is based upon: CHF History: 1 HTN History: 1 Diabetes History: 1 Stroke History: 0 Vascular Disease History: 1 Age Score: 0 Gender Score: 0       ASSESSMENT AND PLAN: 1. Persistent Atrial Fibrillation/atrial flutter The patient's CHA2DS2-VASc score is 4, indicating a 4.8% annual risk of stroke.   Patient remains in persistent afib today. After discussing the risks and benefits patient is now agreeable to dofetilide admission.  Continue Xarelto 20 mg daily, states no missed doses in the last 3 weeks. PharmD to screen medications QTc in SR 417 ms Check bmet/mag today. Continue Toprol 100 mg daily  2. Secondary Hypercoagulable State (ICD10:  D68.69) The patient is at significant risk for stroke/thromboembolism based upon his CHA2DS2-VASc Score of 4.  Continue Rivaroxaban (Xarelto).   3. HFrEF EF recovered to 55-60% His weight is up ~ 8 lbs since his visit here  in October, down from his visit with Dr Gardiner Rhyme however.  He has been on steroids recently. Will check BNP today. May need lasix temporarily adjusted.   4. HTN Stable, no changes today.  5. PAD Followed by Dr Trula Slade at  [facility] Patient continues to smoke.   Follow up in the AF clinic for dofetilide admission next week.    [facility] [address] [place], [place] [address] [phone] 02/11/2022 11:58 AM

## 2022-02-13 ENCOUNTER — Encounter (HOSPITAL_COMMUNITY): Payer: Self-pay

## 2022-02-13 ENCOUNTER — Other Ambulatory Visit: Payer: Self-pay | Admitting: Cardiology

## 2022-02-13 DIAGNOSIS — I10 Essential (primary) hypertension: Secondary | ICD-10-CM

## 2022-02-13 LAB — DRUG TOX MONITOR 1 W/CONF, ORAL FLD
Amphetamines: NEGATIVE ng/mL (ref <10)
Barbiturates: NEGATIVE ng/mL (ref <10)
Benzodiazepines: NEGATIVE ng/mL (ref <0.50)
Buprenorphine: NEGATIVE ng/mL (ref <0.10)
Cocaine: NEGATIVE ng/mL (ref <5.0)
Codeine: NEGATIVE ng/mL (ref <2.5)
Cotinine: >250 ng/mL — ABNORMAL HIGH (ref <5.0)
Dihydrocodeine: 14.6 ng/mL — ABNORMAL HIGH (ref <2.5)
Fentanyl: NEGATIVE ng/mL (ref <0.10)
Heroin Metabolite: NEGATIVE ng/mL (ref <1.0)
Hydrocodone: 152.2 ng/mL — ABNORMAL HIGH (ref <2.5)
Hydromorphone: NEGATIVE ng/mL (ref <2.5)
MARIJUANA: NEGATIVE ng/mL (ref <2.5)
MDMA: NEGATIVE ng/mL (ref <10)
Meprobamate: NEGATIVE ng/mL (ref <2.5)
Methadone: NEGATIVE ng/mL (ref <5.0)
Morphine: NEGATIVE ng/mL (ref <2.5)
Nicotine Metabolite: POSITIVE ng/mL — AB (ref <5.0)
Norhydrocodone: 11.1 ng/mL — ABNORMAL HIGH (ref <2.5)
Noroxycodone: NEGATIVE ng/mL (ref <2.5)
Opiates: POSITIVE ng/mL — AB (ref <2.5)
Oxycodone: NEGATIVE ng/mL (ref <2.5)
Oxymorphone: NEGATIVE ng/mL (ref <2.5)
Phencyclidine: NEGATIVE ng/mL (ref <10)
Tapentadol: NEGATIVE ng/mL (ref <5.0)
Tramadol: NEGATIVE ng/mL (ref <5.0)
Zolpidem: NEGATIVE ng/mL (ref <5.0)

## 2022-02-13 LAB — DRUG TOX ALC METAB W/CON, ORAL FLD: Alcohol Metabolite: NEGATIVE ng/mL (ref <25)

## 2022-02-16 ENCOUNTER — Telehealth: Payer: Self-pay | Admitting: Cardiology

## 2022-02-16 ENCOUNTER — Telehealth (HOSPITAL_COMMUNITY): Payer: Self-pay | Admitting: *Deleted

## 2022-02-16 NOTE — Telephone Encounter (Signed)
Returned call to patient-he states he is scheduled for admission tomorrow to initiate Tikosyn.     Advised I would let Dr. Gardiner Rhyme know.

## 2022-02-16 NOTE — Telephone Encounter (Signed)
Patient called stating he wants to speak to the nurse. Wouldn't go into further detail.

## 2022-02-16 NOTE — Telephone Encounter (Signed)
Patient inpatient stay approved by [facility] for 12/12-12/18 reference number is [id number]

## 2022-02-17 ENCOUNTER — Encounter (HOSPITAL_COMMUNITY): Payer: Self-pay | Admitting: Physician Assistant

## 2022-02-17 ENCOUNTER — Ambulatory Visit (HOSPITAL_COMMUNITY)
Admission: RE | Admit: 2022-02-17 | Discharge: 2022-02-17 | Disposition: A | Discharge status: Home / Self Care | Payer: BC Managed Care – PPO | Source: Ambulatory Visit | Attending: Physician Assistant | Admitting: Physician Assistant

## 2022-02-17 ENCOUNTER — Inpatient Hospital Stay (HOSPITAL_COMMUNITY)
Admission: RE | Admit: 2022-02-17 | Discharge: 2022-02-20 | DRG: 309 | Discharge status: Against Medical Advice | Payer: BC Managed Care – PPO | Attending: Cardiology | Admitting: Cardiology

## 2022-02-17 VITALS — BP 102/56 | HR 133 | Ht 68.0 in | Wt 194.2 lb

## 2022-02-17 DIAGNOSIS — I4819 Other persistent atrial fibrillation: Secondary | ICD-10-CM

## 2022-02-17 DIAGNOSIS — I110 Hypertensive heart disease with heart failure: Secondary | ICD-10-CM | POA: Diagnosis not present

## 2022-02-17 DIAGNOSIS — Z8249 Family history of ischemic heart disease and other diseases of the circulatory system: Secondary | ICD-10-CM

## 2022-02-17 DIAGNOSIS — Z8551 Personal history of malignant neoplasm of bladder: Secondary | ICD-10-CM

## 2022-02-17 DIAGNOSIS — Z833 Family history of diabetes mellitus: Secondary | ICD-10-CM

## 2022-02-17 DIAGNOSIS — K219 Gastro-esophageal reflux disease without esophagitis: Secondary | ICD-10-CM | POA: Diagnosis not present

## 2022-02-17 DIAGNOSIS — I2510 Atherosclerotic heart disease of native coronary artery without angina pectoris: Secondary | ICD-10-CM | POA: Diagnosis not present

## 2022-02-17 DIAGNOSIS — Z803 Family history of malignant neoplasm of breast: Secondary | ICD-10-CM | POA: Diagnosis not present

## 2022-02-17 DIAGNOSIS — F1010 Alcohol abuse, uncomplicated: Secondary | ICD-10-CM | POA: Diagnosis present

## 2022-02-17 DIAGNOSIS — Z881 Allergy status to other antibiotic agents status: Secondary | ICD-10-CM | POA: Diagnosis not present

## 2022-02-17 DIAGNOSIS — E10649 Type 1 diabetes mellitus with hypoglycemia without coma: Secondary | ICD-10-CM | POA: Diagnosis not present

## 2022-02-17 DIAGNOSIS — E10319 Type 1 diabetes mellitus with unspecified diabetic retinopathy without macular edema: Secondary | ICD-10-CM | POA: Diagnosis present

## 2022-02-17 DIAGNOSIS — F17210 Nicotine dependence, cigarettes, uncomplicated: Secondary | ICD-10-CM | POA: Diagnosis present

## 2022-02-17 DIAGNOSIS — I70203 Unspecified atherosclerosis of native arteries of extremities, bilateral legs: Secondary | ICD-10-CM | POA: Diagnosis present

## 2022-02-17 DIAGNOSIS — Z882 Allergy status to sulfonamides status: Secondary | ICD-10-CM

## 2022-02-17 DIAGNOSIS — J4489 Other specified chronic obstructive pulmonary disease: Secondary | ICD-10-CM | POA: Diagnosis not present

## 2022-02-17 DIAGNOSIS — I484 Atypical atrial flutter: Secondary | ICD-10-CM | POA: Diagnosis present

## 2022-02-17 DIAGNOSIS — I5022 Chronic systolic (congestive) heart failure: Secondary | ICD-10-CM | POA: Diagnosis present

## 2022-02-17 DIAGNOSIS — Z79899 Other long term (current) drug therapy: Secondary | ICD-10-CM

## 2022-02-17 DIAGNOSIS — Z888 Allergy status to other drugs, medicaments and biological substances status: Secondary | ICD-10-CM

## 2022-02-17 DIAGNOSIS — D6869 Other thrombophilia: Secondary | ICD-10-CM

## 2022-02-17 DIAGNOSIS — E1051 Type 1 diabetes mellitus with diabetic peripheral angiopathy without gangrene: Secondary | ICD-10-CM | POA: Diagnosis present

## 2022-02-17 DIAGNOSIS — I959 Hypotension, unspecified: Secondary | ICD-10-CM | POA: Diagnosis not present

## 2022-02-17 DIAGNOSIS — I5042 Chronic combined systolic (congestive) and diastolic (congestive) heart failure: Principal | ICD-10-CM

## 2022-02-17 DIAGNOSIS — Z7901 Long term (current) use of anticoagulants: Secondary | ICD-10-CM

## 2022-02-17 DIAGNOSIS — J069 Acute upper respiratory infection, unspecified: Secondary | ICD-10-CM | POA: Diagnosis not present

## 2022-02-17 DIAGNOSIS — Z5329 Procedure and treatment not carried out because of patient's decision for other reasons: Secondary | ICD-10-CM | POA: Diagnosis not present

## 2022-02-17 DIAGNOSIS — J9601 Acute respiratory failure with hypoxia: Secondary | ICD-10-CM | POA: Diagnosis not present

## 2022-02-17 LAB — GLUCOSE, CAPILLARY
Glucose-Capillary: 205 mg/dL — ABNORMAL HIGH (ref 70–99)
Glucose-Capillary: 16 mg/dL — CL (ref 70–99)
Glucose-Capillary: 24 mg/dL — CL (ref 70–99)
Glucose-Capillary: 113 mg/dL — ABNORMAL HIGH (ref 70–99)
Glucose-Capillary: 37 mg/dL — CL (ref 70–99)
Glucose-Capillary: 64 mg/dL — ABNORMAL LOW (ref 70–99)

## 2022-02-17 LAB — BASIC METABOLIC PANEL
Anion gap: 11 (ref 5–15)
BUN: 19 mg/dL (ref 8–23)
CO2: 25 mmol/L (ref 22–32)
Calcium: 9.3 mg/dL (ref 8.9–10.3)
Chloride: 95 mmol/L — ABNORMAL LOW (ref 98–111)
Creatinine, Ser: 1.69 mg/dL — ABNORMAL HIGH (ref 0.61–1.24)
GFR, Estimated: 45 mL/min — ABNORMAL LOW (ref >60)
Glucose, Bld: 297 mg/dL — ABNORMAL HIGH (ref 70–99)
Potassium: 5.4 mmol/L — ABNORMAL HIGH (ref 3.5–5.1)
Sodium: 131 mmol/L — ABNORMAL LOW (ref 135–145)

## 2022-02-17 LAB — MAGNESIUM: Magnesium: 2.3 mg/dL (ref 1.7–2.4)

## 2022-02-17 MED ORDER — LORATADINE 10 MG PO TABS
10.0000 mg | ORAL_TABLET | Freq: Every day | ORAL | Status: DC
  Administered 2022-02-18 – 2022-02-20 (×3): 10 mg via ORAL
  Filled 2022-02-17 (×3): qty 1

## 2022-02-17 MED ORDER — DOFETILIDE 250 MCG PO CAPS
250.0000 ug | ORAL_CAPSULE | Freq: Two times a day (BID) | ORAL | Status: DC
  Administered 2022-02-17 – 2022-02-20 (×6): 250 ug via ORAL
  Filled 2022-02-17 (×6): qty 1

## 2022-02-17 MED ORDER — DEXTROSE 50 % IV SOLN
INTRAVENOUS | Status: AC
  Filled 2022-02-17: qty 50

## 2022-02-17 MED ORDER — ALBUTEROL SULFATE (2.5 MG/3ML) 0.083% IN NEBU
2.5000 mg | INHALATION_SOLUTION | Freq: Four times a day (QID) | RESPIRATORY_TRACT | Status: DC | PRN
  Filled 2022-02-17: qty 3

## 2022-02-17 MED ORDER — RIVAROXABAN 20 MG PO TABS
20.0000 mg | ORAL_TABLET | Freq: Every day | ORAL | Status: DC
  Administered 2022-02-17 – 2022-02-19 (×3): 20 mg via ORAL
  Filled 2022-02-17 (×3): qty 1

## 2022-02-17 MED ORDER — IPRATROPIUM-ALBUTEROL 0.5-2.5 (3) MG/3ML IN SOLN
3.0000 mL | RESPIRATORY_TRACT | Status: DC | PRN
  Administered 2022-02-17: 3 mL via RESPIRATORY_TRACT
  Filled 2022-02-17: qty 3

## 2022-02-17 MED ORDER — AMOXICILLIN-POT CLAVULANATE 875-125 MG PO TABS
1.0000 | ORAL_TABLET | Freq: Two times a day (BID) | ORAL | Status: AC
  Administered 2022-02-17 – 2022-02-18 (×3): 1 via ORAL
  Filled 2022-02-17 (×3): qty 1

## 2022-02-17 MED ORDER — INSULIN ASPART 100 UNIT/ML IJ SOLN
0.0000 [IU] | Freq: Every day | INTRAMUSCULAR | Status: DC
  Administered 2022-02-18: 5 [IU] via SUBCUTANEOUS
  Administered 2022-02-19: 2 [IU] via SUBCUTANEOUS

## 2022-02-17 MED ORDER — SODIUM CHLORIDE 0.9% FLUSH
3.0000 mL | Freq: Two times a day (BID) | INTRAVENOUS | Status: DC
  Administered 2022-02-18 – 2022-02-20 (×3): 3 mL via INTRAVENOUS

## 2022-02-17 MED ORDER — FUROSEMIDE 40 MG PO TABS
40.0000 mg | ORAL_TABLET | Freq: Every day | ORAL | Status: DC
  Administered 2022-02-18 – 2022-02-20 (×3): 40 mg via ORAL
  Filled 2022-02-17 (×3): qty 1

## 2022-02-17 MED ORDER — HYDROCODONE-ACETAMINOPHEN 5-325 MG PO TABS
1.0000 | ORAL_TABLET | Freq: Three times a day (TID) | ORAL | Status: DC | PRN
  Administered 2022-02-17 – 2022-02-20 (×6): 1 via ORAL
  Filled 2022-02-17 (×6): qty 1

## 2022-02-17 MED ORDER — UMECLIDINIUM BROMIDE 62.5 MCG/ACT IN AEPB
1.0000 | INHALATION_SPRAY | Freq: Every day | RESPIRATORY_TRACT | Status: DC
  Administered 2022-02-18: 1 via RESPIRATORY_TRACT
  Filled 2022-02-17: qty 7

## 2022-02-17 MED ORDER — METOPROLOL SUCCINATE ER 100 MG PO TB24
100.0000 mg | ORAL_TABLET | Freq: Every day | ORAL | Status: DC
  Administered 2022-02-18 – 2022-02-20 (×3): 100 mg via ORAL
  Filled 2022-02-17 (×3): qty 1

## 2022-02-17 MED ORDER — INSULIN ASPART 100 UNIT/ML IJ SOLN
0.0000 [IU] | Freq: Three times a day (TID) | INTRAMUSCULAR | Status: DC
  Administered 2022-02-17: 5 [IU] via SUBCUTANEOUS
  Administered 2022-02-18: 3 [IU] via SUBCUTANEOUS
  Administered 2022-02-18: 2 [IU] via SUBCUTANEOUS

## 2022-02-17 MED ORDER — INSULIN GLARGINE-YFGN 100 UNIT/ML ~~LOC~~ SOLN
20.0000 [IU] | Freq: Every day | SUBCUTANEOUS | Status: DC
  Filled 2022-02-17: qty 0.2

## 2022-02-17 MED ORDER — LOSARTAN POTASSIUM 50 MG PO TABS
50.0000 mg | ORAL_TABLET | Freq: Every day | ORAL | Status: DC
  Administered 2022-02-18 – 2022-02-20 (×3): 50 mg via ORAL
  Filled 2022-02-17 (×3): qty 1

## 2022-02-17 MED ORDER — INSULIN ASPART 100 UNIT/ML IJ SOLN
2.0000 [IU] | Freq: Once | INTRAMUSCULAR | Status: AC
  Administered 2022-02-17: 2 [IU] via SUBCUTANEOUS

## 2022-02-17 MED ORDER — SODIUM CHLORIDE 0.9% FLUSH: 3.0000 mL | INTRAVENOUS | Status: DC | PRN

## 2022-02-17 MED ORDER — SODIUM CHLORIDE 0.9 % IV SOLN: 250.0000 mL | INTRAVENOUS | Status: DC | PRN

## 2022-02-17 MED ORDER — EMPAGLIFLOZIN 10 MG PO TABS
10.0000 mg | ORAL_TABLET | Freq: Every day | ORAL | Status: DC
  Administered 2022-02-18 – 2022-02-20 (×3): 10 mg via ORAL
  Filled 2022-02-17 (×3): qty 1

## 2022-02-17 MED ORDER — FAMOTIDINE 20 MG PO TABS
20.0000 mg | ORAL_TABLET | Freq: Every morning | ORAL | Status: DC
  Administered 2022-02-18 – 2022-02-20 (×3): 20 mg via ORAL
  Filled 2022-02-17 (×3): qty 1

## 2022-02-17 MED ORDER — FLUTICASONE FUROATE-VILANTEROL 100-25 MCG/ACT IN AEPB
1.0000 | INHALATION_SPRAY | Freq: Every day | RESPIRATORY_TRACT | Status: DC
  Administered 2022-02-18 – 2022-02-20 (×3): 1 via RESPIRATORY_TRACT
  Filled 2022-02-17: qty 28

## 2022-02-17 NOTE — H&P (Signed)
Electrophysiology H&P  Note    Primary Care Physician: Chevis Pretty, FNP Primary Cardiologist: Dr Gardiner Rhyme Primary Electrophysiologist: Dr Quentin Ore Referring Physician: Darla Lesches NP   Thomas Sandoval. is a 67 y.o. male with a history of alcohol abuse, tobacco abuse, COPD, MVP, perforated gastric ulcer s/p repair 2016, DM, HTN, PAD (bilateral fem-pop occlusion), GERD, systolic CHF, atrial flutter, atrial fibrillation who presents for follow up in the [facility]. The patient was initially diagnosed with atrial flutter in 2012 and afib in the setting of his perforated ulcer. Patient has a CHADS2VASC score of 4, has declined anticoagulation in the past. He was hospitalized 9/26-9/30/22 with acute alcoholic pancreatitis and was also found to be in afib at the time. Echo showed EF 30-35%. He left the hospital AMA. At his visit with his PCP 12/17/20 he was still in afib and was started on diltiazem and Xarelto. Patient is s/p DCCV on 01/20/21 but unfortunately was back in afib by follow up on 01/24/21. He did not feel any different in SR.   Patient seen by Dr Quentin Ore 03/26/21 and discussed ablation but patient deferred at that time. He was admitted 07/2021 for community acquired pneumonia and rapid atrial flutter. He remained in atrial flutter at his visit with Dr Gardiner Rhyme on 08/20/21.  Patient was admitted 11/2021 after being found unresponsive by family. They described possible seizure-like activity. It is unclear whether he had witnessed syncope.  There is no report of prodromal chest pain or shortness of breath.  On EMS arrival he was unresponsive, was hypoglycemic (fingerstick blood sugar 48) and severely hypotensive (60/40 mmHg).  He received a liter of fluid, Narcan (without improvement) and intravenous dextrose.  His glucose increased to over 200 but he remained unresponsive.  His ECG shows diffuse ST segment depression in all leads except aVR where there is  mild ST segment elevation. Symptoms felt to be related to hypoglycemia. Ischemic workup was not pursued at that time. He was in afib but converted to SR prior to discharge. Patient was scheduled to have cardiac CT but was back in afib at the time and the scan was cancelled.   On follow up today, patient presents for dofetilide admission. He reports that his SOB has improved with diureses and with Augmentin from his pulmonologist. His lower extremity edema has also improved. His heart rate was initially elevated at his visit today because his portable O2 machine ran out of battery, rates improved once he was on the wall O2. He denies any missed doses of anticoagulation in the past 3 weeks.   Today, he denies symptoms of palpitations, chest pain, orthopnea, PND,  dizziness, presyncope, syncope, snoring, daytime somnolence, bleeding, or neurologic sequela. The patient is tolerating medications without difficulties and is otherwise without complaint today.    Atrial Fibrillation Risk Factors:  he does not have symptoms or diagnosis of sleep apnea. he does not have a history of rheumatic fever. he does have a history of alcohol use. The patient does not have a history of early familial atrial fibrillation or other arrhythmias.  He has a BMI of Body mass index is 29.53 kg/m.Marland Kitchen    Filed Weights    02/17/22 1050  Weight: 88.1 kg      Family History  Problem Relation Age of Onset   Breast cancer Mother    Cancer Mother        Breast   Rheumatic fever Father    Heart disease Father  Heart attack Father        Massive    Diabetes Son     Atrial Fibrillation Management history:  Previous antiarrhythmic drugs: none Previous cardioversions: 08/25/17, 01/20/21 Previous ablations: none CHADS2VASC score: 4 Anticoagulation history: Xarelto    Past Medical History:  Diagnosis Date   Arthritis    Bladder cancer ([place])    Chronic back pain    Chronic combined systolic and diastolic CHF  (congestive heart failure) ([place]) 07/16/2021   COPD (chronic obstructive pulmonary disease) ([facility])    DDD (degenerative disc disease)    Diabetic retinopathy of both eyes ([place])    Essential hypertension    GERD (gastroesophageal reflux disease)    History of atrial flutter 02/2011   Converted to NSR with Cardizem   History of chronic bronchitis    History of hemolytic anemia 02/2011   secondary to Avelox   HOH (hard of hearing)    HOH (hard of hearing)    no eardrum and nerve damage on R, also HOH on L   Mitral valve prolapse    a. 2D Echo 11/27/14: EF 55-60%; images were inadequate for LV wall motion assessment, + mild late systolic mitral valve prolapse involving the anterior leaflet.   PAD (peripheral artery disease) ([place]) 04/2014   Dr Trula Slade; bilateral SFA occlusion, R mid, L distal   PAF (paroxysmal atrial fibrillation) ([place])    a. Dx 11/2014 during admission for perf ulcer.   Perforated ulcer ([place])    a. 11/2014 s/p surgery.   Productive cough    Smokers' cough ([place])    Type 1 diabetes mellitus ([place]) 1977   Past Surgical History:  Procedure Laterality Date   CARDIOVERSION N/A 01/20/2021   Procedure: CARDIOVERSION;  Surgeon: Fay Records, MD;  Location: [facility];  Service: Cardiovascular;  Laterality: N/A;   CYSTOSCOPY WITH URETEROSCOPY Right 08/14/2013   Procedure: CYSTOSCOPY WITH URETEROSCOPY BLADDER BIOPSY ;  Surgeon: Claybon Jabs, MD;  Location[facility];  Service: Urology;  Laterality: Right;   ESOPHAGOGASTRODUODENOSCOPY N/A 02/06/2013   Procedure: ESOPHAGOGASTRODUODENOSCOPY (EGD);  Surgeon: Lafayette Dragon, MD;  [facility] ENDOSCOPY;  Service: Endoscopy;  Laterality: N/A;   LAPAROSCOPY N/A 11/25/2014   Procedure: LAPAROSCOPIC PRIMARY REPAIR OF PERFORATED PREPYLORIC ULCER WITH Silvestre Gunner;  Surgeon: Greer Pickerel, MD;  Location: [facility];  Service: General;  Laterality: N/A;   TONSILLECTOMY  as child   TRANSTHORACIC ECHOCARDIOGRAM  02-17-2011   MODERATE  LVH/  EF 65%   TRANSURETHRAL RESECTION OF BLADDER TUMOR WITH GYRUS (TURBT-GYRUS) N/A 06/12/2013   Procedure: TRANSURETHRAL RESECTION OF BLADDER TUMOR WITH GYRUS (TURBT-GYRUS);  Surgeon: Claybon Jabs, MD;  Location[facility];  Service: Urology;  Laterality: N/A;   TYMPANIC MEMBRANE REPAIR  as child    Current Facility-Administered Medications  Medication Dose Route Frequency Provider Last Rate Last Admin   insulin aspart (novoLOG) injection 0-15 Units  0-15 Units Subcutaneous TID WC Shirley Friar, PA-C       insulin aspart (novoLOG) injection 0-5 Units  0-5 Units Subcutaneous QHS Shirley Friar, PA-C        Allergies  Allergen Reactions   Azithromycin Other (See Comments) and Nausea And Vomiting    "Severe stomach cramps; told to list as an allergy by dr. Huel Cote ago"   Avelox [Moxifloxacin Hcl In Nacl] Other (See Comments)    Hemolysis  In 2012   Bactrim [Sulfamethoxazole-Trimethoprim] Diarrhea and Nausea And Vomiting   Moxifloxacin Other (See Comments)  Sulfamethoxazole-Trimethoprim Other (See Comments)    Social History   Socioeconomic History   Marital status: Married    Spouse name: Not on file   Number of children: Not on file   Years of education: Not on file   Highest education level: Not on file  Occupational History   Occupation: Tobacco Farmer  Tobacco Use   Smoking status: Every Day    Packs/day: 2.00    Years: 30.00    Total pack years: 60.00    Types: Cigarettes   Smokeless tobacco: Former    Quit date: 06/08/1978   Tobacco comments:    3/4 of a pack of cigarettes smoked daily [facility] 01/28/22  Vaping Use   Vaping Use: Never used  Substance and Sexual Activity   Alcohol use: Yes    Comment: 5 quarts per week 12/26/21   Drug use: No   Sexual activity: Not on file  Other Topics Concern   Not on file  Social History Narrative   Lives in [place] with wife and 2 sons.    Social Determinants of Health   Financial Resource  Strain: Not on file  Food Insecurity: Unknown (11/27/2021)   Hunger Vital Sign    Worried About Running Out of Food in the Last Year: Patient refused    [place] in the Last Year: Patient refused  Transportation Needs: Unknown (11/27/2021)   PRAPARE - Hydrologist (Medical): Patient refused    Lack of Transportation (Non-Medical): Patient refused  Physical Activity: Not on file  Stress: Not on file  Social Connections: Not on file  Intimate Partner Violence: Unknown (11/27/2021)   Humiliation, Afraid, Rape, and Kick questionnaire    Fear of Current or Ex-Partner: Patient refused    Emotionally Abused: Patient refused    Physically Abused: Patient refused    Sexually Abused: Patient refused     ROS- All systems are reviewed and negative except as per the HPI above.  Physical Exam:    Vitals:    02/17/22 1050  BP: (!) 102/56  Pulse: (!) 133  SpO2: 97%  Weight: 88.1 kg  Height: '5\' 8"'$  (1.727 m)     GEN- The patient is a well appearing male, alert and oriented x 3 today.   HEENT-head normocephalic, atraumatic, sclera clear, conjunctiva pink, hearing intact, trachea midline. Lungs- faint wheezing bilaterally, on O2 nasal canula  Heart- irregular rate and rhythm, no murmurs, rubs or gallops  GI- soft, NT, ND, + BS Extremities- no clubbing, cyanosis, or edema MS- no significant deformity or atrophy Skin- no rash or lesion Psych- euthymic mood, full affect Neuro- strength and sensation are intact   Wt Readings from Last 3 Encounters:  02/17/22 88.1 kg  02/11/22 87.5 kg  02/11/22 87 kg    EKG today demonstrates  Atypical atrial flutter with variable block Vent. rate 133 BPM PR interval * ms QRS duration 74 ms QT/QTcB 330/491 ms  Echo 05/21/21 demonstrated   1. Left ventricular ejection fraction, by estimation, is 55 to 60%. The  left ventricle has normal function. The left ventricle has no regional  wall motion abnormalities. Left  ventricular diastolic parameters are  consistent with Grade II diastolic dysfunction (pseudonormalization). Elevated left ventricular end-diastolic pressure.   2. Right ventricular systolic function is normal. The right ventricular  size is normal. There is normal pulmonary artery systolic pressure.   3. The mitral valve is normal in structure. No evidence of mitral valve  regurgitation.  No evidence of mitral stenosis.   4. The aortic valve is tricuspid. Aortic valve regurgitation is not  visualized. No aortic stenosis is present.   5. The inferior vena cava is normal in size with greater than 50%  respiratory variability, suggesting right atrial pressure of 3 mmHg.   Epic records are reviewed at length today  CHA2DS2-VASc Score = 4  The patient's score is based upon: CHF History: 1 HTN History: 1 Diabetes History: 1 Stroke History: 0 Vascular Disease History: 1 Age Score: 0 Gender Score: 0       ASSESSMENT AND PLAN: 1. Persistent Atrial Fibrillation/atrial flutter The patient's CHA2DS2-VASc score is 4, indicating a 4.8% annual risk of stroke.   Patient presents for dofetilide admission.  Heart rate improved on recheck once back on oxygen.  Continue Xarelto 20 mg daily, states no missed doses in the last 3 weeks. No recent benadryl use PharmD has screened medications QTc in SR 417 ms Labs today show creatinine at 1.69, K+ 5.4 and mag 2.3, CrCl calculated at 55 mL/min. Will hold KCL.  Continue Toprol 100 mg daily  2. Secondary Hypercoagulable State (ICD10:  D68.69) The patient is at significant risk for stroke/thromboembolism based upon his CHA2DS2-VASc Score of 4.  Continue Rivaroxaban (Xarelto).   3. HFrEF EF recovered to 55-60% Lower extremity edema improved.   Will hold KCL, patient decreased his Lasix back to once daily today.   4. HTN Stable, no changes today.  5. PAD Followed by Dr Trula Slade at [facility] Patient continues to smoke.  6. CAD Severe 3-vessel coronary  calcifications seen on CT Coronary CT cancelled due to rapid afib.  7. DM2 HgbA1c check tomorrow.  Have ordered sliding scale and consulted Diabetes coordinator for recommendation regarding basal insulin.   Pt presents today for [facility] admission  Legrand Como "Oda Kilts, [place]  02/17/2022 2:59 PM

## 2022-02-17 NOTE — Progress Notes (Signed)
Primary Care Physician: Chevis Pretty, FNP Primary Cardiologist: Dr Gardiner Rhyme Primary Electrophysiologist: Dr Quentin Ore Referring Physician: Darla Lesches NP   Thomas Sandoval. is a 66 y.o. male with a history of alcohol abuse, tobacco abuse, COPD, MVP, perforated gastric ulcer s/p repair 2016, DM, HTN, PAD (bilateral fem-pop occlusion), GERD, systolic CHF, atrial flutter, atrial fibrillation who presents for follow up in the [facility]. The patient was initially diagnosed with atrial flutter in 2012 and afib in the setting of his perforated ulcer. Patient has a CHADS2VASC score of 4, has declined anticoagulation in the past. He was hospitalized 9/26-9/30/22 with acute alcoholic pancreatitis and was also found to be in afib at the time. Echo showed EF 30-35%. He left the hospital AMA. At his visit with his PCP 12/17/20 he was still in afib and was started on diltiazem and Xarelto. Patient is s/p DCCV on 01/20/21 but unfortunately was back in afib by follow up on 01/24/21. He did not feel any different in SR.   Patient seen by Dr Quentin Ore 03/26/21 and discussed ablation but patient deferred at that time. He was admitted 07/2021 for community acquired pneumonia and rapid atrial flutter. He remained in atrial flutter at his visit with Dr Gardiner Rhyme on 08/20/21.  Patient was admitted 11/2021 after being found unresponsive by family. They described possible seizure-like activity. It is unclear whether he had witnessed syncope.  There is no report of prodromal chest pain or shortness of breath.  On EMS arrival he was unresponsive, was hypoglycemic (fingerstick blood sugar 48) and severely hypotensive (60/40 mmHg).  He received a liter of fluid, Narcan (without improvement) and intravenous dextrose.  His glucose increased to over 200 but he remained unresponsive.  His ECG shows diffuse ST segment depression in all leads except aVR where there is mild ST segment elevation.  Symptoms felt to be related to hypoglycemia. Ischemic workup was not pursued at that time. He was in afib but converted to SR prior to discharge. Patient was scheduled to have cardiac CT but was back in afib at the time and the scan was cancelled.   On follow up today, patient presents for dofetilide admission. He reports that his SOB has improved with diureses and with Augmentin from his pulmonologist. His lower extremity edema has also improved. His heart rate was initially elevated at his visit today because his portable O2 machine ran out of battery, rates improved once he was on the wall O2. He denies any missed doses of anticoagulation in the past 3 weeks.   Today, he denies symptoms of palpitations, chest pain, orthopnea, PND,  dizziness, presyncope, syncope, snoring, daytime somnolence, bleeding, or neurologic sequela. The patient is tolerating medications without difficulties and is otherwise without complaint today.    Atrial Fibrillation Risk Factors:  he does not have symptoms or diagnosis of sleep apnea. he does not have a history of rheumatic fever. he does have a history of alcohol use. The patient does not have a history of early familial atrial fibrillation or other arrhythmias.  he has a BMI of Body mass index is 29.53 kg/m.Marland Kitchen Filed Weights   02/17/22 1050  Weight: 88.1 kg     Family History  Problem Relation Age of Onset   Breast cancer Mother    Cancer Mother        Breast   Rheumatic fever Father    Heart disease Father    Heart attack Father  Massive    Diabetes Son     Atrial Fibrillation Management history:  Previous antiarrhythmic drugs: none Previous cardioversions: 08/25/17, 01/20/21 Previous ablations: none CHADS2VASC score: 4 Anticoagulation history: Xarelto    Past Medical History:  Diagnosis Date   Arthritis    Bladder cancer ([place])    Chronic back pain    Chronic combined systolic and diastolic CHF (congestive heart failure) ([place])  07/16/2021   COPD (chronic obstructive pulmonary disease) ([facility])    DDD (degenerative disc disease)    Diabetic retinopathy of both eyes ([place])    Essential hypertension    GERD (gastroesophageal reflux disease)    History of atrial flutter 02/2011   Converted to NSR with Cardizem   History of chronic bronchitis    History of hemolytic anemia 02/2011   secondary to Avelox   HOH (hard of hearing)    HOH (hard of hearing)    no eardrum and nerve damage on R, also HOH on L   Mitral valve prolapse    a. 2D Echo 11/27/14: EF 55-60%; images were inadequate for LV wall motion assessment, + mild late systolic mitral valve prolapse involving the anterior leaflet.   PAD (peripheral artery disease) ([place]) 04/2014   Dr Trula Slade; bilateral SFA occlusion, R mid, L distal   PAF (paroxysmal atrial fibrillation) ([place])    a. Dx 11/2014 during admission for perf ulcer.   Perforated ulcer ([place])    a. 11/2014 s/p surgery.   Productive cough    Smokers' cough ([place])    Type 1 diabetes mellitus ([place]) 1977   Past Surgical History:  Procedure Laterality Date   CARDIOVERSION N/A 01/20/2021   Procedure: CARDIOVERSION;  Surgeon: Fay Records, MD;  Location: [facility];  Service: Cardiovascular;  Laterality: N/A;   CYSTOSCOPY WITH URETEROSCOPY Right 08/14/2013   Procedure: CYSTOSCOPY WITH URETEROSCOPY BLADDER BIOPSY ;  Surgeon: Claybon Jabs, MD;  Location[facility];  Service: Urology;  Laterality: Right;   ESOPHAGOGASTRODUODENOSCOPY N/A 02/06/2013   Procedure: ESOPHAGOGASTRODUODENOSCOPY (EGD);  Surgeon: Lafayette Dragon, MD;  Location: [facility] ENDOSCOPY;  Service: Endoscopy;  Laterality: N/A;   LAPAROSCOPY N/A 11/25/2014   Procedure: LAPAROSCOPIC PRIMARY REPAIR OF PERFORATED PREPYLORIC ULCER WITH Silvestre Gunner;  Surgeon: Greer Pickerel, MD;  Location: [place] [facility];  Service: General;  Laterality: N/A;   TONSILLECTOMY  as child   TRANSTHORACIC ECHOCARDIOGRAM  02-17-2011   MODERATE LVH/  EF 65%   TRANSURETHRAL  RESECTION OF BLADDER TUMOR WITH GYRUS (TURBT-GYRUS) N/A 06/12/2013   Procedure: TRANSURETHRAL RESECTION OF BLADDER TUMOR WITH GYRUS (TURBT-GYRUS);  Surgeon: Claybon Jabs, MD;  Location: [facility];  Service: Urology;  Laterality: N/A;   TYMPANIC MEMBRANE REPAIR  as child    Current Outpatient Medications  Medication Sig Dispense Refill   albuterol (VENTOLIN HFA) 108 (90 Base) MCG/ACT inhaler Inhale 2 puffs into the lungs every 6 (six) hours as needed for wheezing or shortness of breath. 18 g 2   amoxicillin-clavulanate (AUGMENTIN) 875-125 MG tablet Take 1 tablet by mouth 2 (two) times daily. 14 tablet 0   cetirizine (ZYRTEC) 10 MG tablet Take 10 mg by mouth daily.     empagliflozin (JARDIANCE) 10 MG TABS tablet Take 1 tablet (10 mg total) by mouth daily before breakfast. 30 tablet 11   famotidine (PEPCID) 20 MG tablet Take 1 tablet (20 mg total) by mouth every morning. Reported on 04/16/2015 90 tablet 1   furosemide (LASIX) 40 MG tablet Take 1 tablet (40 mg total) by  mouth daily. 90 tablet 3   HYDROcodone-acetaminophen (NORCO/VICODIN) 5-325 MG tablet Take 1 tablet by mouth 3 (three) times daily as needed for severe pain. 90 tablet 0   insulin aspart (NOVOLOG) 100 UNIT/ML injection Per sliding scale 190-200=2u. 300 and above 7u 10 mL 1   insulin glargine (LANTUS) 100 UNIT/ML injection Inject 0.2 mLs (20 Units total) into the skin daily. (Patient taking differently: Inject 42 Units into the skin daily.) 30 mL 5   Insulin Syringe-Needle U-100 (B-D INS SYR ULTRAFINE 1CC/30G) 30G X 1/2" 1 ML MISC Use 4 times a day with insulin Dx E11.9 400 each 3   Insulin Syringes, Disposable, U-100 1 ML MISC Use 4 times a day for insulin injection Dx E11.9 400 each 5   ipratropium-albuterol (DUONEB) 0.5-2.5 (3) MG/3ML SOLN Take 3 mLs by nebulization every 4 (four) hours as needed. 360 mL 0   levalbuterol (XOPENEX) 0.31 MG/3ML nebulizer solution Take 3 mLs (0.31 mg total) by nebulization every 4 (four)  hours as needed for wheezing. 3 mL 12   losartan (COZAAR) 50 MG tablet TAKE 1 TABLET BY MOUTH EVERY DAY 90 tablet 3   metoprolol succinate (TOPROL-XL) 100 MG 24 hr tablet Take 1 tablet (100 mg total) by mouth daily. Take with or immediately following a meal. 90 tablet 1   naloxone (NARCAN) nasal spray 4 mg/0.1 mL Place 1 spray into the nose as needed (For opiate overdose). 1 each 2   OVER THE COUNTER MEDICATION Take 1 Scoop by mouth in the morning and at bedtime. Super Beets     potassium chloride SA (KLOR-CON M) 20 MEQ tablet Take 1 tablet by mouth twice a day for the next 3 days then once daily 36 tablet 0   rivaroxaban (XARELTO) 20 MG TABS tablet Take 1 tablet (20 mg total) by mouth daily with supper. 30 tablet 5   TRELEGY ELLIPTA 100-62.5-25 MCG/ACT AEPB Inhale 1 puff into the lungs daily.     No current facility-administered medications for this encounter.    Allergies  Allergen Reactions   Azithromycin Other (See Comments) and Nausea And Vomiting    "Severe stomach cramps; told to list as an allergy by dr. Huel Cote ago"   Avelox [Moxifloxacin Hcl In Nacl] Other (See Comments)    Hemolysis  In 2012   Bactrim [Sulfamethoxazole-Trimethoprim] Diarrhea and Nausea And Vomiting   Moxifloxacin Other (See Comments)   Sulfamethoxazole-Trimethoprim Other (See Comments)    Social History   Socioeconomic History   Marital status: Married    Spouse name: Not on file   Number of children: Not on file   Years of education: Not on file   Highest education level: Not on file  Occupational History   Occupation: Tobacco Farmer  Tobacco Use   Smoking status: Every Day    Packs/day: 2.00    Years: 30.00    Total pack years: 60.00    Types: Cigarettes   Smokeless tobacco: Former    Quit date: 06/08/1978   Tobacco comments:    3/4 of a pack of cigarettes smoked daily [facility] 01/28/22  Vaping Use   Vaping Use: Never used  Substance and Sexual Activity   Alcohol use: Yes    Comment: 5 quarts per  week 12/26/21   Drug use: No   Sexual activity: Not on file  Other Topics Concern   Not on file  Social History Narrative   Lives in [place] with wife and 2 sons.    Social Determinants of Health  Financial Resource Strain: Not on file  Food Insecurity: Unknown (11/27/2021)   Hunger Vital Sign    Worried About Running Out of Food in the Last Year: Patient refused    [place] in the Last Year: Patient refused  Transportation Needs: Unknown (11/27/2021)   PRAPARE - Hydrologist (Medical): Patient refused    Lack of Transportation (Non-Medical): Patient refused  Physical Activity: Not on file  Stress: Not on file  Social Connections: Not on file  Intimate Partner Violence: Unknown (11/27/2021)   Humiliation, Afraid, Rape, and Kick questionnaire    Fear of Current or Ex-Partner: Patient refused    Emotionally Abused: Patient refused    Physically Abused: Patient refused    Sexually Abused: Patient refused     ROS- All systems are reviewed and negative except as per the HPI above.  Physical Exam: Vitals:   02/17/22 1050  BP: (!) 102/56  Pulse: (!) 133  SpO2: 97%  Weight: 88.1 kg  Height: '5\' 8"'$  (1.727 m)    GEN- The patient is a well appearing male, alert and oriented x 3 today.   HEENT-head normocephalic, atraumatic, sclera clear, conjunctiva pink, hearing intact, trachea midline. Lungs- faint wheezing bilaterally, on O2 nasal canula  Heart- irregular rate and rhythm, no murmurs, rubs or gallops  GI- soft, NT, ND, + BS Extremities- no clubbing, cyanosis, or edema MS- no significant deformity or atrophy Skin- no rash or lesion Psych- euthymic mood, full affect Neuro- strength and sensation are intact   Wt Readings from Last 3 Encounters:  02/17/22 88.1 kg  02/11/22 87.5 kg  02/11/22 87 kg    EKG today demonstrates  Atypical atrial flutter with variable block Vent. rate 133 BPM PR interval * ms QRS duration 74 ms QT/QTcB  330/491 ms  Echo 05/21/21 demonstrated   1. Left ventricular ejection fraction, by estimation, is 55 to 60%. The  left ventricle has normal function. The left ventricle has no regional  wall motion abnormalities. Left ventricular diastolic parameters are  consistent with Grade II diastolic dysfunction (pseudonormalization). Elevated left ventricular end-diastolic pressure.   2. Right ventricular systolic function is normal. The right ventricular  size is normal. There is normal pulmonary artery systolic pressure.   3. The mitral valve is normal in structure. No evidence of mitral valve  regurgitation. No evidence of mitral stenosis.   4. The aortic valve is tricuspid. Aortic valve regurgitation is not  visualized. No aortic stenosis is present.   5. The inferior vena cava is normal in size with greater than 50%  respiratory variability, suggesting right atrial pressure of 3 mmHg.   Epic records are reviewed at length today  CHA2DS2-VASc Score = 4  The patient's score is based upon: CHF History: 1 HTN History: 1 Diabetes History: 1 Stroke History: 0 Vascular Disease History: 1 Age Score: 0 Gender Score: 0       ASSESSMENT AND PLAN: 1. Persistent Atrial Fibrillation/atrial flutter The patient's CHA2DS2-VASc score is 4, indicating a 4.8% annual risk of stroke.   Patient presents for dofetilide admission.  Heart rate improved on recheck once back on oxygen.  Continue Xarelto 20 mg daily, states no missed doses in the last 3 weeks. No recent benadryl use PharmD has screened medications QTc in SR 417 ms Labs today show creatinine at 1.69, K+ 5.4 and mag 2.3, CrCl calculated at 55 mL/min. Will hold KCL.  Continue Toprol 100 mg daily  2. Secondary Hypercoagulable State (  ICD10:  D68.69) The patient is at significant risk for stroke/thromboembolism based upon his CHA2DS2-VASc Score of 4.  Continue Rivaroxaban (Xarelto).   3. HFrEF EF recovered to 55-60% Lower extremity edema  improved.   Will hold KCL, patient decreased his Lasix back to once daily today.   4. HTN Stable, no changes today.  5. PAD Followed by Dr Trula Slade at [facility] Patient continues to smoke.  6. CAD Severe 3-vessel coronary calcifications seen on CT Coronary CT cancelled due to rapid afib.   To be admitted later today once a bed becomes available.    [facility] [address] [place], [place] [address] [phone] 02/17/2022 11:00 AM

## 2022-02-17 NOTE — Progress Notes (Signed)
Pharmacy: Dofetilide (Tikosyn) - Initial Consult Assessment and Electrolyte Replacement  Pharmacy consulted to assist in monitoring and replacing electrolytes in this 66 y.o. male admitted on 02/17/2022 undergoing dofetilide initiation. First dofetilide dose: 12/12 2000  Assessment:  Patient Exclusion Criteria: If any screening criteria checked as "Yes", then  patient  should NOT receive dofetilide until criteria item is corrected.  If "Yes" please indicate correction plan.  YES  NO Patient  Exclusion Criteria Correction Plan   '[]'$   '[x]'$   Baseline QTc interval is greater than or equal to 440 msec. IF above YES box checked dofetilide contraindicated unless patient has ICD; then may proceed if QTc 500-550 msec or with known ventricular conduction abnormalities may proceed with QTc 550-600 msec. QTc = QTc in SR 417 ms (per MD note)     '[]'$   '[x]'$   Patient is known or suspected to have a digoxin level greater than 2 ng/ml: No results found for: "DIGOXIN"     '[]'$   '[x]'$   Creatinine clearance less than 20 ml/min (calculated using Cockcroft-Gault, actual body weight and serum creatinine): Estimated Creatinine Clearance: 46.7 mL/min (A) (by C-G formula based on SCr of 1.69 mg/dL (H)).     '[]'$   '[x]'$  Patient has received drugs known to prolong the QT intervals within the last 48 hours (phenothiazines, tricyclics or tetracyclic antidepressants, erythromycin, H-1 antihistamines, cisapride, fluoroquinolones, azithromycin, ondansetron).   Updated information on QT prolonging agents is available to be searched on the following database:QT prolonging agents     '[]'$   '[x]'$   Patient received a dose of hydrochlorothiazide (Oretic) alone or in any combination including triamterene (Dyazide, Maxzide) in the last 48 hours.    '[]'$   '[x]'$  Patient received a medication known to increase dofetilide plasma concentrations prior to initial dofetilide dose:  Trimethoprim (Primsol, Proloprim) in the last 36  hours Verapamil (Calan, Verelan) in the last 36 hours or a sustained release dose in the last 72 hours Megestrol (Megace) in the last 5 days  Cimetidine (Tagamet) in the last 6 hours Ketoconazole (Nizoral) in the last 24 hours Itraconazole (Sporanox) in the last 48 hours  Prochlorperazine (Compazine) in the last 36 hours     '[]'$   '[x]'$   Patient is known to have a history of torsades de pointes; congenital or acquired long QT syndromes.    '[]'$   '[x]'$   Patient has received a Class 1 antiarrhythmic with less than 2 half-lives since last dose. (Disopyramide, Quinidine, Procainamide, Lidocaine, Mexiletine, Flecainide, Propafenone)    '[]'$   '[x]'$   Patient has received amiodarone therapy in the past 3 months or amiodarone level is greater than 0.3 ng/ml.    Labs:    Component Value Date/Time   K 5.4 (H) 02/17/2022 1128   MG 2.3 02/17/2022 1128     Plan: Select One Calculated CrCl  Dose q12h  '[]'$  > 60 ml/min 500 mcg  '[x]'$  40-60 ml/min 250 mcg  '[]'$  20-40 ml/min 125 mcg   '[x]'$   Physician selected initial dose within range recommended for patients level of renal function - will monitor for response.  '[]'$   Physician selected initial dose outside of range recommended for patients level of renal function - will discuss if the dose should be altered at this time.   Patient has been appropriately anticoagulated with Xarelto  Potassium: K >/= 4: Appropriate to initiate Tikosyn, no replacement needed    Magnesium: Mg >2: Appropriate to initiate Tikosyn, no replacement needed     Thank you for allowing pharmacy to participate in  this patient's care   Sherlon Handing, PharmD, BCPS Please see amion for complete clinical pharmacist phone list 02/17/2022  4:05 PM

## 2022-02-18 ENCOUNTER — Other Ambulatory Visit (HOSPITAL_COMMUNITY): Payer: Self-pay

## 2022-02-18 ENCOUNTER — Ambulatory Visit: Payer: BC Managed Care – PPO | Admitting: Registered Nurse

## 2022-02-18 LAB — BASIC METABOLIC PANEL
Anion gap: 9 (ref 5–15)
BUN: 20 mg/dL (ref 8–23)
CO2: 27 mmol/L (ref 22–32)
Calcium: 9 mg/dL (ref 8.9–10.3)
Chloride: 97 mmol/L — ABNORMAL LOW (ref 98–111)
Creatinine, Ser: 1.6 mg/dL — ABNORMAL HIGH (ref 0.61–1.24)
GFR, Estimated: 48 mL/min — ABNORMAL LOW (ref >60)
Glucose, Bld: 283 mg/dL — ABNORMAL HIGH (ref 70–99)
Potassium: 5 mmol/L (ref 3.5–5.1)
Sodium: 133 mmol/L — ABNORMAL LOW (ref 135–145)

## 2022-02-18 LAB — GLUCOSE, CAPILLARY
Glucose-Capillary: 238 mg/dL — ABNORMAL HIGH (ref 70–99)
Glucose-Capillary: 165 mg/dL — ABNORMAL HIGH (ref 70–99)
Glucose-Capillary: 144 mg/dL — ABNORMAL HIGH (ref 70–99)
Glucose-Capillary: 267 mg/dL — ABNORMAL HIGH (ref 70–99)
Glucose-Capillary: 389 mg/dL — ABNORMAL HIGH (ref 70–99)

## 2022-02-18 LAB — HEMOGLOBIN A1C
Hgb A1c MFr Bld: 6.4 % — ABNORMAL HIGH (ref 4.8–5.6)
Mean Plasma Glucose: 137 mg/dL

## 2022-02-18 LAB — MAGNESIUM: Magnesium: 2.3 mg/dL (ref 1.7–2.4)

## 2022-02-18 MED ORDER — IPRATROPIUM-ALBUTEROL 0.5-2.5 (3) MG/3ML IN SOLN
3.0000 mL | Freq: Four times a day (QID) | RESPIRATORY_TRACT | Status: DC
  Filled 2022-02-18: qty 3

## 2022-02-18 MED ORDER — IPRATROPIUM-ALBUTEROL 0.5-2.5 (3) MG/3ML IN SOLN
3.0000 mL | Freq: Four times a day (QID) | RESPIRATORY_TRACT | Status: DC
  Administered 2022-02-18 – 2022-02-19 (×3): 3 mL via RESPIRATORY_TRACT
  Filled 2022-02-18 (×2): qty 3

## 2022-02-18 MED ORDER — INSULIN ASPART 100 UNIT/ML IJ SOLN
0.0000 [IU] | Freq: Three times a day (TID) | INTRAMUSCULAR | Status: DC
  Administered 2022-02-18: 5 [IU] via SUBCUTANEOUS

## 2022-02-18 NOTE — Progress Notes (Signed)
Electrophysiology Rounding Note  Patient Name: Thomas Sandoval. Date of Encounter: 02/19/2022   Primary Cardiologist: Donato Heinz, MD  Electrophysiologist: Vickie Epley, MD    Subjective   Pt remains in NSR on Tikosyn 250 mcg BID   QTc from EKG last pm shows stable QTc at ~470-480  The patient is doing well today.  At this time, the patient denies chest pain, shortness of breath, or any new concerns.  Inpatient Medications    Scheduled Meds:  dofetilide  250 mcg Oral BID   empagliflozin  10 mg Oral QAC breakfast   famotidine  20 mg Oral q morning   fluticasone furoate-vilanterol  1 puff Inhalation Daily   furosemide  40 mg Oral Daily   insulin aspart  0-5 Units Subcutaneous QHS   insulin aspart  0-9 Units Subcutaneous TID WC   ipratropium-albuterol  3 mL Nebulization QID   loratadine  10 mg Oral Daily   losartan  50 mg Oral Daily   metoprolol succinate  100 mg Oral Daily   rivaroxaban  20 mg Oral Q supper   sodium chloride flush  3 mL Intravenous Q12H   Continuous Infusions:  sodium chloride     PRN Meds: sodium chloride, albuterol, HYDROcodone-acetaminophen, sodium chloride flush   Vital Signs    Vitals:   02/18/22 2100 02/18/22 2346 02/19/22 0417 02/19/22 0757  BP:  130/60 124/72 114/70  Pulse:  73 61 63  Resp:  '20 19 18  '$ Temp:  98.4 F (36.9 C) 97.8 F (36.6 C) 98.5 F (36.9 C)  TempSrc:  Oral Oral Oral  SpO2: 96% 97% 99% 97%  Weight:      Height:        Intake/Output Summary (Last 24 hours) at 02/19/2022 0825 Last data filed at 02/18/2022 1439 Gross per 24 hour  Intake 180 ml  Output 850 ml  Net -670 ml   Filed Weights   02/17/22 1555  Weight: 84.3 kg    Physical Exam    GEN- The patient is well appearing, alert and oriented x 3 today.   Head- normocephalic, atraumatic Eyes-  Sclera clear, conjunctiva pink Ears- hearing intact Oropharynx- clear Neck- supple Lungs- Clear to ausculation bilaterally, normal work of  breathing Heart- Regular rate and rhythm, no murmurs, rubs or gallops GI- soft, NT, ND, + BS Extremities- no clubbing, cyanosis, or edema Skin- no rash or lesion Psych- euthymic mood, full affect Neuro- strength and sensation are intact  Labs    CBC No results for input(s): "WBC", "NEUTROABS", "HGB", "HCT", "MCV", "PLT" in the last 72 hours. Basic Metabolic Panel Recent Labs    02/18/22 0219 02/19/22 0202  NA 133* 135  K 5.0 4.5  CL 97* 100  CO2 27 28  GLUCOSE 283* 191*  BUN 20 22  CREATININE 1.60* 1.46*  CALCIUM 9.0 9.2  MG 2.3 2.4    Potassium  Date/Time Value Ref Range Status  02/19/2022 02:02 AM 4.5 3.5 - 5.1 mmol/L Final    Comment:    HEMOLYSIS AT THIS LEVEL MAY AFFECT RESULT   Magnesium  Date/Time Value Ref Range Status  02/19/2022 02:02 AM 2.4 1.7 - 2.4 mg/dL Final    Comment:    Performed at [facility], [place] [address], [place], [place] [address]    Telemetry    NSR 60-70s (personally reviewed)  Radiology    No results found.   Patient Profile     Thomas Matthis. is  a 66 y.o. male with a past medical history significant for persistent atrial fibrillation.  They were admitted for tikosyn load.   Assessment & Plan    Persistent atrial fibrillation Pt remains in NSR on Tikosyn 250 mcg BID  Continue Xarelto Creatinine, ser  1.46* (12/14 0202) Magnesium  2.4 (12/14 0202) Potassium4.5 (12/14 0202) No electrolyte supplementation needed  Plan for home Friday if QTc remains stable.  2. DM2 Appreciate DM coordinator recs   For questions or updates, please contact [place] Please consult [email or web address] for contact info under Cardiology/STEMI.  Signed, Shirley Friar, PA-C  02/19/2022, 8:25 AM

## 2022-02-18 NOTE — Progress Notes (Signed)
Paged with update for pts low CBG overnight.   Updated Diabetes coordinate consult to STAT.    Morning EKG reviewed     Shows remains in NSR at 76 bpm with stable QTc at ~470 ms.  Continue  Tikosyn 250 mcg BID.   Potassium5.0 (12/13 0219) Magnesium  2.3 (12/13 0219) Creatinine, ser  1.60* (12/13 0219)  Plan for home Friday if QTc remains stable   Shirley Friar, [place]  02/18/2022 10:07 AM

## 2022-02-18 NOTE — TOC Benefit Eligibility Note (Signed)
Patient Teacher, English as a foreign language completed.    The patient is currently admitted and upon discharge could be taking dofetilide (Tikosyn) 250 mcg capsules.  The current 30 day co-pay is $0.00.   The patient is insured through [employer] [place], [place] Patient Advocate Specialist [place] Patient Advocate Team Direct Number: [phone]  Fax: [phone]

## 2022-02-18 NOTE — Care Management (Signed)
  Transition of Care [facility]) Screening Note   Patient Details  Name: Thomas Sandoval. Date of Birth: [birth year]   Transition of [facility]) CM/SW Contact:    Bethena Roys, RN Phone Number: 02/18/2022, 1:43 PM    Transition of Care Department [facility]) has reviewed the patient. Patient presented for Tikosyn Load- Benefits check submitted for cost- $0.00. Case Manager will discuss cost and pharmacy of choice as the patient progresses.

## 2022-02-18 NOTE — Inpatient Diabetes Management (Addendum)
Inpatient Diabetes Program Recommendations  AACE/ADA: New Consensus Statement on Inpatient Glycemic Control   Target Ranges:  Prepandial:   less than 140 mg/dL      Peak postprandial:   less than 180 mg/dL (1-2 hours)      Critically ill patients:  140 - 180 mg/dL    Latest Reference Range & Units 02/18/22 04:48 02/18/22 07:53  Glucose-Capillary 70 - 99 mg/dL 238 (H) 165 (H)    Latest Reference Range & Units 02/17/22 15:29 02/17/22 22:11 02/17/22 22:12 02/17/22 22:31 02/17/22 22:53 02/17/22 23:22  Glucose-Capillary 70 - 99 mg/dL 205 (H)  Novolog 7 units 24 (LL) 16 (LL) 37 (LL) 64 (L) 113 (H)    Latest Reference Range & Units 02/02/22 14:24  HB A1C (BAYER DCA - WAIVED) 4.8 - 5.6 % 6.0 (H)   Review of Glycemic Control  Diabetes history: DM1 Outpatient Diabetes medications: Lantus 42 units QAM (+/- 4-6 units based on glucose), Novolog 2-7 units TID, Jardiance 10 mg QAM Current orders for Inpatient glycemic control: Jardiance 10 mg QAM, Novolog 0-15 units TID with meals, Novolog 0-5 units QHS  Inpatient Diabetes Program Recommendations:    Insulin: Novolog 7 units given for correction at 16:40 and glucose down to 24 mg/dl at 22:11 and 16 gm/dl at 22:12. Please consider decreasing Novolog correction to 0-9 units TID with meals.  Addendum 02/18/22'@13'$ :25-Spoke with patient regarding DM control. Patient reports that he is taking Lantus 42 units QAM (plus or minus 4-6 units based on glucose trends the day prior), Novolog on sliding scale TID, and Jardiance 10 mg QAM.  Patient reports that he tries to keep  his glucose fairly tightly controlled. Patient reports that he doesn't have to use the Novolog as often as 3 times a day, just depends on glucose. Patient reports that he gets symptoms of hypoglycemia with glucose is less than 80 mg/dl. Patient reports checking glucose 4-6 times a day. Inquired about using a CGM sensor and patient states he does not want to use a sensor because they cost too  much so he would rather do finger sticks. Inquired about hypoglycemia yesterday when glucose was noted to be 24 mg/dl and 16 mg/dl. Patient states he was awake and when staff woke him up that he could tell right away that his glucose was low. Patient reports having hypoglycemia 2-3 times a week. Discussed dangers of hypoglycemia and explained that he should be taking a set dose of Lantus that his glucose does not go too low on. Patient reports that he knows his body and has had DM since 1977 and he does not think it is good to take the same dose of Lantus because different things impact his sugar differently. Explained that he has no received any basal insulin since being admitted and fasting was 165 mg/dl and noon CBG 144 mg/dl. Informed patient that per PCP note on 02/02/22 that he is prescribed Lantus 20 units daily and given that he experienced hypoglycemia while inpatient, he likely needs to decrease Lantus dose outpatient. Patient reports that he feels that maybe the Tikosyn has effected his glucose and made it lower. Encouraged patient to follow up with PCP and pay attention to discharge paperwork in case the Lantus dose is changed at time of discharge. Patient verbalized understanding and has no questions at this time.   Thanks,  Thomas Alderman, RN, MSN, [place] Diabetes Coordinator Inpatient Diabetes Program [phone] (Team Pager from 8am to [place])

## 2022-02-18 NOTE — Progress Notes (Signed)
Electrophysiology Rounding Note  Patient Name: Thomas Sandoval. Date of Encounter: 02/18/2022  Primary Cardiologist: Donato Heinz, MD  Electrophysiologist: Vickie Epley, MD    Subjective   Pt converted to sinus rhythm on Tikosyn 250 mcg BID   QTc from EKG last pm shows stable QTc at ~450. Sinus EKG stable this am as well  The patient is doing well today.  At this time, the patient denies chest pain, shortness of breath, or any new concerns.  Inpatient Medications    Scheduled Meds:  amoxicillin-clavulanate  1 tablet Oral BID   dextrose       dofetilide  250 mcg Oral BID   empagliflozin  10 mg Oral QAC breakfast   famotidine  20 mg Oral q morning   fluticasone furoate-vilanterol  1 puff Inhalation Daily   And   umeclidinium bromide  1 puff Inhalation Daily   furosemide  40 mg Oral Daily   insulin aspart  0-15 Units Subcutaneous TID WC   insulin aspart  0-5 Units Subcutaneous QHS   insulin glargine-yfgn  20 Units Subcutaneous Daily   loratadine  10 mg Oral Daily   losartan  50 mg Oral Daily   metoprolol succinate  100 mg Oral Daily   rivaroxaban  20 mg Oral Q supper   sodium chloride flush  3 mL Intravenous Q12H   Continuous Infusions:  sodium chloride     PRN Meds: sodium chloride, albuterol, dextrose, HYDROcodone-acetaminophen, ipratropium-albuterol, sodium chloride flush   Vital Signs    Vitals:   02/17/22 1555 02/17/22 2306 02/18/22 0751 02/18/22 0758  BP:  111/64  116/64  Pulse:  100 82 80  Resp:  18 18   Temp:    98 F (36.7 C)  TempSrc:    Oral  SpO2:  95% 100% 96%  Weight: 84.3 kg     Height: '5\' 8"'$  (1.727 m)       Intake/Output Summary (Last 24 hours) at 02/18/2022 0831 Last data filed at 02/18/2022 0400 Gross per 24 hour  Intake 480 ml  Output 1580 ml  Net -1100 ml   Filed Weights   02/17/22 1555  Weight: 84.3 kg    Physical Exam    GEN- The patient is well appearing, alert and oriented x 3 today.   Head-  normocephalic, atraumatic Eyes-  Sclera clear, conjunctiva pink Ears- hearing intact Oropharynx- clear Neck- supple Lungs- Clear to ausculation bilaterally, normal work of breathing Heart- Regular rate and rhythm, no murmurs, rubs or gallops GI- soft, NT, ND, + BS Extremities- no clubbing, cyanosis, or edema Skin- no rash or lesion Psych- euthymic mood, full affect Neuro- strength and sensation are intact  Labs    CBC No results for input(s): "WBC", "NEUTROABS", "HGB", "HCT", "MCV", "PLT" in the last 72 hours. Basic Metabolic Panel Recent Labs    02/17/22 1128 02/18/22 0219  NA 131* 133*  K 5.4* 5.0  CL 95* 97*  CO2 25 27  GLUCOSE 297* 283*  BUN 19 20  CREATININE 1.69* 1.60*  CALCIUM 9.3 9.0  MG 2.3 2.3    Potassium  Date/Time Value Ref Range Status  02/18/2022 02:19 AM 5.0 3.5 - 5.1 mmol/L Final   Magnesium  Date/Time Value Ref Range Status  02/18/2022 02:19 AM 2.3 1.7 - 2.4 mg/dL Final    Comment:    Performed at [facility], [place] [address] [place], [place] [address]    Telemetry    NSR 70s this am,  converted overnight (personally reviewed)  Radiology    No results found.   Patient Profile     Thomas Sandoval. is a 66 y.o. male with a past medical history significant for persistent atrial fibrillation.  They were admitted for tikosyn load.   Assessment & Plan    Persistent atrial fibrillation Pt converted to sinus rhythm on Tikosyn 250 mcg BID  Continue Xarelto Creatinine, ser  1.60* (12/13 0219) Magnesium  2.3 (12/13 0219) Potassium5.0 (12/13 0219) No electrolyte supplementation needed  Plan for home Friday if QTc remains stable.  2. DM2 Hgb A1c pending.  Have requested Diabetes Coordinator assistance  For questions or updates, please contact [place] Please consult [email or web address] for contact info under Cardiology/STEMI.  Signed, Shirley Friar, PA-C  02/18/2022, 8:31 AM

## 2022-02-18 NOTE — Progress Notes (Addendum)
Per nightshift RN Chanda Busing patient's cbg was 16 (at 22:12 on 02/17/22), he was treated with a mountain dew, OJ and a sandwich. At 23:22 his CBG=113. Will Update Any Tillery PA via phone call of this event.  Stated to hold AM dose of Semglee

## 2022-02-18 NOTE — Progress Notes (Signed)
Pharmacy: Dofetilide (Tikosyn) - Follow Up Assessment and Electrolyte Replacement  Pharmacy consulted to assist in monitoring and replacing electrolytes in this 66 y.o. male admitted on 02/17/2022 undergoing dofetilide initiation. First dofetilide dose: 12/12'@2007'$   Labs:    Component Value Date/Time   K 5.0 02/18/2022 0219   MG 2.3 02/18/2022 0219     Plan: Potassium: K >/= 4: No additional supplementation needed  Magnesium: Mg > 2: No additional supplementation needed  Thank you for allowing pharmacy to participate in this patient's care   Antonietta Jewel, PharmD, [place] Pharmacist  Phone: [phone] 02/18/2022 7:32 AM  Please check AMION for all [place] phone numbers After 10:00 PM, call [place] [phone]

## 2022-02-19 LAB — BASIC METABOLIC PANEL
Anion gap: 7 (ref 5–15)
BUN: 22 mg/dL (ref 8–23)
CO2: 28 mmol/L (ref 22–32)
Calcium: 9.2 mg/dL (ref 8.9–10.3)
Chloride: 100 mmol/L (ref 98–111)
Creatinine, Ser: 1.46 mg/dL — ABNORMAL HIGH (ref 0.61–1.24)
GFR, Estimated: 53 mL/min — ABNORMAL LOW (ref >60)
Glucose, Bld: 191 mg/dL — ABNORMAL HIGH (ref 70–99)
Potassium: 4.5 mmol/L (ref 3.5–5.1)
Sodium: 135 mmol/L (ref 135–145)

## 2022-02-19 LAB — GLUCOSE, CAPILLARY
Glucose-Capillary: 206 mg/dL — ABNORMAL HIGH (ref 70–99)
Glucose-Capillary: 208 mg/dL — ABNORMAL HIGH (ref 70–99)
Glucose-Capillary: 247 mg/dL — ABNORMAL HIGH (ref 70–99)
Glucose-Capillary: 247 mg/dL — ABNORMAL HIGH (ref 70–99)
Glucose-Capillary: 342 mg/dL — ABNORMAL HIGH (ref 70–99)
Glucose-Capillary: 218 mg/dL — ABNORMAL HIGH (ref 70–99)

## 2022-02-19 LAB — MAGNESIUM: Magnesium: 2.4 mg/dL (ref 1.7–2.4)

## 2022-02-19 MED ORDER — INSULIN ASPART 100 UNIT/ML IJ SOLN
0.0000 [IU] | Freq: Three times a day (TID) | INTRAMUSCULAR | Status: DC
  Administered 2022-02-19 (×2): 5 [IU] via SUBCUTANEOUS

## 2022-02-19 MED ORDER — INSULIN ASPART 100 UNIT/ML IJ SOLN
0.0000 [IU] | Freq: Three times a day (TID) | INTRAMUSCULAR | Status: DC
  Administered 2022-02-19: 7 [IU] via SUBCUTANEOUS
  Administered 2022-02-20: 5 [IU] via SUBCUTANEOUS

## 2022-02-19 MED ORDER — IPRATROPIUM-ALBUTEROL 0.5-2.5 (3) MG/3ML IN SOLN
3.0000 mL | Freq: Three times a day (TID) | RESPIRATORY_TRACT | Status: DC
  Administered 2022-02-19 (×2): 3 mL via RESPIRATORY_TRACT
  Filled 2022-02-19 (×3): qty 3

## 2022-02-19 MED ORDER — INSULIN GLARGINE-YFGN 100 UNIT/ML ~~LOC~~ SOLN
15.0000 [IU] | Freq: Every day | SUBCUTANEOUS | Status: DC
  Administered 2022-02-19 – 2022-02-20 (×2): 15 [IU] via SUBCUTANEOUS
  Filled 2022-02-19 (×2): qty 0.15

## 2022-02-19 NOTE — Progress Notes (Signed)
Pharmacy: Dofetilide (Tikosyn) - Follow Up Assessment and Electrolyte Replacement  Pharmacy consulted to assist in monitoring and replacing electrolytes in this 66 y.o. male admitted on 02/17/2022 undergoing dofetilide initiation. First dofetilide dose: 12/12'@2007'$   Labs:    Component Value Date/Time   K 4.5 02/19/2022 0202   MG 2.4 02/19/2022 0202     Plan: Potassium: K >/= 4: No additional supplementation needed  Magnesium: Mg > 2: No additional supplementation needed  Thank you for allowing pharmacy to participate in this patient's care   Antonietta Jewel, PharmD, [place] Pharmacist  Phone: [phone] 02/19/2022 7:27 AM  Please check AMION for all [place] phone numbers After 10:00 PM, call [place] [phone]

## 2022-02-19 NOTE — Care Management (Signed)
1421 02-19-22 Case Manager spoke with patient and he would like initial Rx filled via [facility] and Rx Refills 90 day supply sent to [facility] in [place]. No further needs identified at this time.

## 2022-02-19 NOTE — Progress Notes (Addendum)
Morning EKG reviewed     Shows remains in SB at 59 bpm with stable QTc at ~460-470 ms.  Continue  Tikosyn 250 mcg BID.   Potassium4.5 (12/14 0202) Magnesium  2.4 (12/14 0202) Creatinine, ser  1.46* (12/14 0202)  Plan for home Friday if QTc remains stable   Sliding scale and LA insulin adjusted per DM coordinator recommendations   Shirley Friar, PA-C  02/19/2022 12:11 PM

## 2022-02-19 NOTE — Inpatient Diabetes Management (Signed)
Inpatient Diabetes Program Recommendations  AACE/ADA: New Consensus Statement on Inpatient Glycemic Control (2015)  Target Ranges:  Prepandial:   less than 140 mg/dL      Peak postprandial:   less than 180 mg/dL (1-2 hours)      Critically ill patients:  140 - 180 mg/dL   Lab Results  Component Value Date   GLUCAP 208 (H) 02/19/2022   HGBA1C 6.4 (H) 02/18/2022    Latest Reference Range & Units 02/18/22 04:48 02/18/22 07:53 02/18/22 12:00 02/18/22 16:02 02/18/22 21:31 02/19/22 00:58 02/19/22 08:45  Glucose-Capillary 70 - 99 mg/dL 238 (H) 165 (H) 144 (H) 267 (H) 389 (H) 206 (H) 208 (H)  (H): Data is abnormally high  Diabetes history: DM1 Outpatient Diabetes medications: Lantus 42 units QAM (+/- 4-6 units based on glucose), Novolog 2-7 units TID, Jardiance 10 mg QAM Current orders for Inpatient glycemic control: Jardiance 10 mg QAM, Novolog 0-15 units TID with meals, Novolog 0-5 units QHS  Inpatient Diabetes Program Recommendations:   Please consider: -Add Semglee 15 units qd (0.2 units/kg x 84.3 kg = 16.3 units) -Decrease Novolog correction to 0-9 units tid, 0-5 units hs  Thank you, Bethena Roys E. Tung Pustejovsky, RN, MSN, CDE  Diabetes Coordinator Inpatient Glycemic Control Team Team Pager [phone] (8am-5pm) 02/19/2022 11:56 AM

## 2022-02-20 ENCOUNTER — Telehealth: Payer: Self-pay | Admitting: Pulmonary Disease

## 2022-02-20 ENCOUNTER — Other Ambulatory Visit (HOSPITAL_COMMUNITY): Payer: Self-pay

## 2022-02-20 LAB — BASIC METABOLIC PANEL
Anion gap: 8 (ref 5–15)
BUN: 27 mg/dL — ABNORMAL HIGH (ref 8–23)
CO2: 26 mmol/L (ref 22–32)
Calcium: 9.3 mg/dL (ref 8.9–10.3)
Chloride: 102 mmol/L (ref 98–111)
Creatinine, Ser: 1.66 mg/dL — ABNORMAL HIGH (ref 0.61–1.24)
GFR, Estimated: 46 mL/min — ABNORMAL LOW (ref >60)
Glucose, Bld: 163 mg/dL — ABNORMAL HIGH (ref 70–99)
Potassium: 4.8 mmol/L (ref 3.5–5.1)
Sodium: 136 mmol/L (ref 135–145)

## 2022-02-20 LAB — GLUCOSE, CAPILLARY
Glucose-Capillary: 293 mg/dL — ABNORMAL HIGH (ref 70–99)
Glucose-Capillary: 322 mg/dL — ABNORMAL HIGH (ref 70–99)

## 2022-02-20 LAB — MAGNESIUM: Magnesium: 2.4 mg/dL (ref 1.7–2.4)

## 2022-02-20 MED ORDER — DOFETILIDE 250 MCG PO CAPS
250.0000 ug | ORAL_CAPSULE | Freq: Two times a day (BID) | ORAL | 6 refills | Status: DC
  Filled 2022-02-20: qty 60, 30d supply, fill #0

## 2022-02-20 MED ORDER — METOPROLOL SUCCINATE ER 50 MG PO TB24
75.0000 mg | ORAL_TABLET | Freq: Two times a day (BID) | ORAL | 6 refills | Status: DC
  Filled 2022-02-20: qty 90, 30d supply, fill #0

## 2022-02-20 NOTE — Progress Notes (Signed)
Pharmacy: Dofetilide (Tikosyn) - Follow Up Assessment and Electrolyte Replacement  Pharmacy consulted to assist in monitoring and replacing electrolytes in this 66 y.o. male admitted on 02/17/2022 undergoing dofetilide initiation. First dofetilide dose: 12/12'@2007'$   Labs:    Component Value Date/Time   K 4.8 02/20/2022 0235   MG 2.4 02/20/2022 0235     Plan: Potassium: K >/= 4: No additional supplementation needed  Magnesium: Mg > 2: No additional supplementation needed  Thank you for allowing pharmacy to participate in this patient's care   Arrie Senate, PharmD, BCPS, [facility] Clinical Pharmacist [phone] Please check AMION for all [place] numbers 02/20/2022

## 2022-02-20 NOTE — Telephone Encounter (Signed)
Spoke with the pt  He is currently admitted to the hospital Wanted Dr Lake Bells to be made aware of this  He feels that once he is sent home he may need additional abx "for my congestion"  Sending to Dr Lake Bells as Juluis Rainier

## 2022-02-20 NOTE — Telephone Encounter (Signed)
First appt with you is not until 1/24 unless we use a held nodule slot. Please advise on this.

## 2022-02-20 NOTE — Telephone Encounter (Signed)
We can work him into a slot next Friday 12/22.

## 2022-02-20 NOTE — Discharge Summary (Addendum)
ELECTROPHYSIOLOGY PROCEDURE DISCHARGE SUMMARY    Patient ID: Thomas Sandoval.,  MRN: [id number], DOB/AGE: 09-19-1955 66 y.o.  Admit date: 02/17/2022 Discharge date: 02/20/2022  Primary Care Physician: Chevis Pretty, [place]  Primary Cardiologist: Donato Heinz, MD  Electrophysiologist: Dr. Quentin Ore   Primary Discharge Diagnosis:  1.  Persistent atrial fibrillation status post Tikosyn loading this admission  Secondary Discharge Diagnosis:  2. DM2  Allergies  Allergen Reactions   Avelox [Moxifloxacin Hcl In Nacl] Other (See Comments)    Hemolysis  In 2012   Azithromycin Other (See Comments) and Nausea And Vomiting    "Severe stomach cramps; told to list as an allergy by dr. Huel Cote ago"   Bactrim [Sulfamethoxazole-Trimethoprim] Diarrhea and Nausea And Vomiting     Procedures This Admission:  1.  Tikosyn loading   Brief HPI: [name]. is a 66 y.o. male with a past medical history as noted above.  They were referred to EP for treatment options of atrial fibrillation.  Risks, benefits, and alternatives to Tikosyn were reviewed with the patient who wished to proceed with admission for loading.  Hospital Course:  The patient was admitted and Tikosyn was initiated.  Renal function and electrolytes were followed during the hospitalization.  Their QTc remained stable. The patient converted chemically and did not require cardioversion. The patients QTc remained stable. They were monitored on telemetry up to discharge. Unfortunately, he did go back into AF with relatively rapid rates the am of discharge. Pt did not have severe symptoms and was still quite adamant on going home. On the day of discharge, they were examined by Dr. Quentin Ore  who considered them stable for discharge to home.  Follow-up has been arranged with the Atrial Fibrillation clinic next week with instructions to call with worsening symptoms. Metoprolol increased for better rate control.  He  recently finished Augmentin for a URI, and will reach out to PCP vs pulmonary for follow up.   ADDENDUM Prior to pt receiving meds or discharge instructions pt left AMA, without signing paperwork or even having his IV removed.   Discussed with wife via telephone.   Pt was becoming edgy and just decided he was ready to leave. He will look for his discharge instruction on my chart, or else obtain them from AF clinic next week.   Spoke with Dr. Quentin Ore and [facility] pharmacy, and have cancelled any refills to his tikosyn. He must follow up appropriately for [place] to continue refilling it.   Wife verbalizes understanding that taking this medication outside of the intended method can have life threatening consequences.    They are aware of AF clinic visit next week and will seek care with any worsening symptoms.   Physical Exam: Vitals:   02/19/22 2033 02/20/22 0508 02/20/22 0810 02/20/22 0907  BP:  (!) 116/53 (!) 157/110   Pulse: (!) 56 72 (!) 144   Resp: 14 16    Temp:  97.8 F (36.6 C)    TempSrc:  Oral    SpO2:  98%  100%  Weight:      Height:        GEN- The patient is well appearing, alert and oriented x 3 today.   HEENT: normocephalic, atraumatic; sclera clear, conjunctiva pink; hearing intact; oropharynx clear; neck supple, no JVP Lymph- no cervical lymphadenopathy Lungs- Clear to ausculation bilaterally, normal work of breathing.  No wheezes, rales, rhonchi Heart- Rapid and irregularly irregular rate and rhythm, no murmurs, rubs or gallops,  PMI not laterally displaced GI- soft, non-tender, non-distended, bowel sounds present, no hepatosplenomegaly Extremities- no clubbing, cyanosis, or edema; DP/PT/radial pulses 2+ bilaterally MS- no significant deformity or atrophy Skin- warm and dry, no rash or lesion Psych- euthymic mood, full affect Neuro- strength and sensation are intact  Labs:   Lab Results  Component Value Date   WBC 11.2 (H) 02/02/2022   HGB 15.5 02/02/2022   HCT 45.0  02/02/2022   MCV 102 (H) 02/02/2022   PLT 329 02/02/2022    Recent Labs  Lab 02/20/22 0235  NA 136  K 4.8  CL 102  CO2 26  BUN 27*  CREATININE 1.66*  CALCIUM 9.3  GLUCOSE 163*    Discharge Medications:  Allergies as of 02/20/2022       Reactions   Avelox [moxifloxacin Hcl In Nacl] Other (See Comments)   Hemolysis  In 2012   Azithromycin Other (See Comments), Nausea And Vomiting   "Severe stomach cramps; told to list as an allergy by dr. Years ago"   Bactrim [sulfamethoxazole-trimethoprim] Diarrhea, Nausea And Vomiting        Medication List     TAKE these medications    albuterol 108 (90 Base) MCG/ACT inhaler Commonly known as: VENTOLIN HFA Inhale 2 puffs into the lungs every 6 (six) hours as needed for wheezing or shortness of breath.   amoxicillin-clavulanate 875-125 MG tablet Commonly known as: AUGMENTIN Take 1 tablet by mouth 2 (two) times daily.   B-D INS SYR ULTRAFINE 1CC/30G 30G X 1/2" 1 ML Misc Generic drug: Insulin Syringe-Needle U-100 Use 4 times a day with insulin Dx E11.9   cetirizine 10 MG tablet Commonly known as: ZYRTEC Take 10 mg by mouth daily.   dofetilide 250 MCG capsule Commonly known as: TIKOSYN Take 1 capsule (250 mcg total) by mouth 2 (two) times daily.   empagliflozin 10 MG Tabs tablet Commonly known as: Jardiance Take 1 tablet (10 mg total) by mouth daily before breakfast.   famotidine 20 MG tablet Commonly known as: PEPCID Take 1 tablet (20 mg total) by mouth every morning. Reported on 04/16/2015 What changed:  when to take this additional instructions   furosemide 40 MG tablet Commonly known as: LASIX Take 1 tablet (40 mg total) by mouth daily.   HYDROcodone-acetaminophen 5-325 MG tablet Commonly known as: NORCO/VICODIN Take 1 tablet by mouth 3 (three) times daily as needed for severe pain. What changed: when to take this   insulin aspart 100 UNIT/ML injection Commonly known as: NovoLOG Per sliding scale 190-200=2u.  300 and above 7u What changed:  how much to take how to take this when to take this additional instructions   insulin glargine 100 UNIT/ML injection Commonly known as: Lantus Inject 0.2 mLs (20 Units total) into the skin daily. What changed:  how much to take when to take this   Insulin Syringes (Disposable) U-100 1 ML Misc Use 4 times a day for insulin injection Dx E11.9   ipratropium-albuterol 0.5-2.5 (3) MG/3ML Soln Commonly known as: DUONEB Take 3 mLs by nebulization every 4 (four) hours as needed. What changed: when to take this   Klor-Con M20 20 MEQ tablet Generic drug: potassium chloride SA Take 20 mEq by mouth daily.   levalbuterol 0.31 MG/3ML nebulizer solution Commonly known as: XOPENEX Take 3 mLs (0.31 mg total) by nebulization every 4 (four) hours as needed for wheezing.   losartan 50 MG tablet Commonly known as: COZAAR TAKE 1 TABLET BY MOUTH EVERY DAY What changed: when  to take this   metoprolol succinate 50 MG 24 hr tablet Commonly known as: TOPROL-XL Take 1.5 tablets (75 mg total) by mouth 2 (two) times daily. Take with or immediately following a meal. What changed:  medication strength how much to take when to take this   naloxone 4 MG/0.1ML Liqd nasal spray kit Commonly known as: NARCAN Place 1 spray into the nose as needed (For opiate overdose).   OVER THE COUNTER MEDICATION Take 1-2 tablets by mouth See admin instructions. Super Beets gummies- Chew 1-2 gummies by mouth every day   OXYGEN Inhale 3 L/min into the lungs continuous.   rivaroxaban 20 MG Tabs tablet Commonly known as: Xarelto Take 1 tablet (20 mg total) by mouth daily with supper. What changed: when to take this   Trelegy Ellipta 100-62.5-25 MCG/ACT Aepb Generic drug: Fluticasone-Umeclidin-Vilant Inhale 1 puff into the lungs 2 (two) times daily.        Disposition:    Follow-up Information     [facility] Follow up.   Specialty:  Cardiology Why: on 12/21 at 330 pm for post tikosyn check.   Call sooner if you have worsening SOB or symptoms. Contact information: [address] [id number] [place] [address] [phone]                Duration of Discharge Encounter: Greater than 30 minutes including physician time.  Jacalyn Lefevre, PA-C  02/20/2022 11:51 AM

## 2022-02-20 NOTE — Progress Notes (Signed)
EKG from yesterday evening 02/19/2022 reviewed     Shows remains in NSR with stable QTc at ~470 ms.  Continue  Tikosyn 250 mcg BID.   Potassium4.8 (12/15 0235) Magnesium  2.4 (12/15 0235) Creatinine, ser  1.66* (12/15 0235)  No electrolyte supplementation needed.  Plan for home this afternoon if QTc remains stable.   Shirley Friar, PA-C  02/20/2022 7:31 AM

## 2022-02-20 NOTE — Progress Notes (Addendum)
Patient just walked out of unit with IV still intact. Refuses to let staff take out. This RN tried to stop patient but patient kept on walking/ Wife was still in the room and just walked out too. Security called and Surveyor, quantity C.J notified. RN hasn't been able to go over discharge orders yet. Provider, Oda Kilts also notified  Kandis Ban, RN

## 2022-02-20 NOTE — Telephone Encounter (Signed)
Please get him in to be seen w me for a hosp f/u visit so we can discuss his sx and decide about antibiotics. Thanks.

## 2022-02-23 NOTE — Progress Notes (Signed)
02/20/2022 1330 I was finally able to locate pt. He had been picked up by his wife. Wife requested that the meds to be called in at his pharmacy. I made her aware that [facility] had filled medications and I requested she bring pt back and I would meet them outside of the hospital. I gave pt his metoprolol and tikosyn that had been filled by [facility]. Pt also allowed me to remove his IV. Pt stated that he left because they had already had enough time to discharge him. Carroll Kinds RN

## 2022-02-23 NOTE — Telephone Encounter (Signed)
Transition Care Management Unsuccessful Follow-up Telephone Call  Date of discharge and from where:  02/20/2022  Thomas Sandoval   Attempts:  1st Attempt  Reason for unsuccessful TCM follow-up call:  No answer/busy

## 2022-02-23 NOTE — Telephone Encounter (Signed)
PT calling again about Antibx. Out of hospital now. States Dr. Lake Bells told him he may have to be in them  them all the time but they have run out. Please call @ [phone]  Pharm no refills: [facility] in [place] [address]   Also, currently on TikosynWill it disrupt his other meds?

## 2022-02-23 NOTE — Telephone Encounter (Signed)
Called and left voice mail on Bernard (pt's wife per [facility]) phone to schedule hospital follow up for Quillian Quince. Front desk if Jerome calls back please schedule in one of held spots on Friday 02/27/22. Thank you

## 2022-02-24 ENCOUNTER — Other Ambulatory Visit: Payer: Self-pay | Admitting: Emergency Medicine

## 2022-02-24 DIAGNOSIS — J189 Pneumonia, unspecified organism: Secondary | ICD-10-CM

## 2022-02-24 DIAGNOSIS — R946 Abnormal results of thyroid function studies: Secondary | ICD-10-CM

## 2022-02-24 DIAGNOSIS — J449 Chronic obstructive pulmonary disease, unspecified: Secondary | ICD-10-CM

## 2022-02-24 NOTE — Telephone Encounter (Signed)
Thank you :)

## 2022-02-24 NOTE — Telephone Encounter (Signed)
Patient called back and scheduled HFU with RB on 12/22 at 3pm.

## 2022-02-26 ENCOUNTER — Ambulatory Visit (HOSPITAL_COMMUNITY)
Admit: 2022-02-26 | Discharge: 2022-02-26 | Disposition: A | Discharge status: Home / Self Care | Payer: BC Managed Care – PPO | Source: Ambulatory Visit | Attending: Physician Assistant | Admitting: Physician Assistant

## 2022-02-26 ENCOUNTER — Telehealth: Payer: Self-pay | Admitting: *Deleted

## 2022-02-26 VITALS — BP 162/76 | HR 62 | Ht 68.0 in | Wt 194.4 lb

## 2022-02-26 DIAGNOSIS — I5022 Chronic systolic (congestive) heart failure: Secondary | ICD-10-CM | POA: Insufficient documentation

## 2022-02-26 DIAGNOSIS — F17210 Nicotine dependence, cigarettes, uncomplicated: Secondary | ICD-10-CM | POA: Insufficient documentation

## 2022-02-26 DIAGNOSIS — Z7984 Long term (current) use of oral hypoglycemic drugs: Secondary | ICD-10-CM | POA: Diagnosis not present

## 2022-02-26 DIAGNOSIS — K219 Gastro-esophageal reflux disease without esophagitis: Secondary | ICD-10-CM | POA: Insufficient documentation

## 2022-02-26 DIAGNOSIS — Z79899 Other long term (current) drug therapy: Secondary | ICD-10-CM | POA: Insufficient documentation

## 2022-02-26 DIAGNOSIS — Z7901 Long term (current) use of anticoagulants: Secondary | ICD-10-CM | POA: Diagnosis not present

## 2022-02-26 DIAGNOSIS — I739 Peripheral vascular disease, unspecified: Secondary | ICD-10-CM | POA: Insufficient documentation

## 2022-02-26 DIAGNOSIS — I4819 Other persistent atrial fibrillation: Secondary | ICD-10-CM

## 2022-02-26 DIAGNOSIS — Z794 Long term (current) use of insulin: Secondary | ICD-10-CM | POA: Diagnosis not present

## 2022-02-26 DIAGNOSIS — D6869 Other thrombophilia: Secondary | ICD-10-CM

## 2022-02-26 DIAGNOSIS — I110 Hypertensive heart disease with heart failure: Secondary | ICD-10-CM | POA: Insufficient documentation

## 2022-02-26 DIAGNOSIS — E108 Type 1 diabetes mellitus with unspecified complications: Secondary | ICD-10-CM | POA: Insufficient documentation

## 2022-02-26 DIAGNOSIS — J449 Chronic obstructive pulmonary disease, unspecified: Secondary | ICD-10-CM | POA: Insufficient documentation

## 2022-02-26 DIAGNOSIS — I2510 Atherosclerotic heart disease of native coronary artery without angina pectoris: Secondary | ICD-10-CM | POA: Diagnosis not present

## 2022-02-26 DIAGNOSIS — I4892 Unspecified atrial flutter: Secondary | ICD-10-CM | POA: Insufficient documentation

## 2022-02-26 LAB — BASIC METABOLIC PANEL
Anion gap: 10 (ref 5–15)
BUN: 16 mg/dL (ref 8–23)
CO2: 32 mmol/L (ref 22–32)
Calcium: 10.6 mg/dL — ABNORMAL HIGH (ref 8.9–10.3)
Chloride: 94 mmol/L — ABNORMAL LOW (ref 98–111)
Creatinine, Ser: 1.48 mg/dL — ABNORMAL HIGH (ref 0.61–1.24)
GFR, Estimated: 53 mL/min — ABNORMAL LOW (ref >60)
Glucose, Bld: 47 mg/dL — ABNORMAL LOW (ref 70–99)
Potassium: 4.8 mmol/L (ref 3.5–5.1)
Sodium: 136 mmol/L (ref 135–145)

## 2022-02-26 LAB — MAGNESIUM: Magnesium: 2 mg/dL (ref 1.7–2.4)

## 2022-02-26 MED ORDER — DOFETILIDE 250 MCG PO CAPS: 250.0000 ug | ORAL_CAPSULE | Freq: Two times a day (BID) | ORAL | 1 refills | Status: DC

## 2022-02-26 NOTE — Progress Notes (Signed)
Primary Care Physician: Chevis Pretty, FNP Primary Cardiologist: Dr Gardiner Rhyme Primary Electrophysiologist: Dr Quentin Ore Referring Physician: Darla Lesches NP   [name] is a 66 y.o. male with a history of alcohol abuse, tobacco abuse, COPD, MVP, perforated gastric ulcer s/p repair 2016, DM, HTN, PAD (bilateral fem-pop occlusion), GERD, systolic CHF, atrial flutter, atrial fibrillation who presents for follow up in the [facility]. The patient was initially diagnosed with atrial flutter in 2012 and afib in the setting of his perforated ulcer. Patient has a CHADS2VASC score of 4, has declined anticoagulation in the past. He was hospitalized 9/26-9/30/22 with acute alcoholic pancreatitis and was also found to be in afib at the time. Echo showed EF 30-35%. He left the hospital AMA. At his visit with his PCP 12/17/20 he was still in afib and was started on diltiazem and Xarelto. Patient is s/p DCCV on 01/20/21 but unfortunately was back in afib by follow up on 01/24/21. He did not feel any different in SR.   Patient seen by Dr Quentin Ore 03/26/21 and discussed ablation but patient deferred at that time. He was admitted 07/2021 for community acquired pneumonia and rapid atrial flutter. He remained in atrial flutter at his visit with Dr Gardiner Rhyme on 08/20/21.  Patient was admitted 11/2021 after being found unresponsive by family. They described possible seizure-like activity. It is unclear whether he had witnessed syncope.  There is no report of prodromal chest pain or shortness of breath.  On EMS arrival he was unresponsive, was hypoglycemic (fingerstick blood sugar 48) and severely hypotensive (60/40 mmHg).  He received a liter of fluid, Narcan (without improvement) and intravenous dextrose.  His glucose increased to over 200 but he remained unresponsive.  His ECG shows diffuse ST segment depression in all leads except aVR where there is mild ST segment elevation.  Symptoms felt to be related to hypoglycemia. Ischemic workup was not pursued at that time. He was in afib but converted to SR prior to discharge. Patient was scheduled to have cardiac CT but was back in afib at the time and the scan was cancelled.   On follow up today, patient is s/p dofetilide admission 12/12-12/15/23. He converted chemically but reverted back to afib prior to discharge. Prior to pt receiving meds or discharge instructions pt left AMA, without signing paperwork or even having his IV removed. Pt was becoming edgy and just decided he was ready to leave. EP team discussed importance of compliance with medication and follow up. He has converted to SR since discharge. He states that he does not feel much different in SR compared to afib. He has an appointment with his pulmonologist tomorrow.   Today, he denies symptoms of palpitations, chest pain, orthopnea, PND,  dizziness, presyncope, syncope, snoring, daytime somnolence, bleeding, or neurologic sequela. The patient is tolerating medications without difficulties and is otherwise without complaint today.    Atrial Fibrillation Risk Factors:  he does not have symptoms or diagnosis of sleep apnea. he does not have a history of rheumatic fever. he does have a history of alcohol use. The patient does not have a history of early familial atrial fibrillation or other arrhythmias.  he has a BMI of Body mass index is 29.56 kg/m.Marland Kitchen Filed Weights   02/26/22 1551  Weight: 88.2 kg    Family History  Problem Relation Age of Onset   Breast cancer Mother    Cancer Mother        Breast   Rheumatic  fever Father    Heart disease Father    Heart attack Father        Massive    Diabetes Son     Atrial Fibrillation Management history:  Previous antiarrhythmic drugs: dofetilide  Previous cardioversions: 08/25/17, 01/20/21 Previous ablations: none CHADS2VASC score: 4 Anticoagulation history: Xarelto    Past Medical History:  Diagnosis  Date   Arthritis    Bladder cancer ([place])    Chronic back pain    Chronic combined systolic and diastolic CHF (congestive heart failure) ([place]) 07/16/2021   COPD (chronic obstructive pulmonary disease) ([facility])    DDD (degenerative disc disease)    Diabetic retinopathy of both eyes ([place])    Essential hypertension    GERD (gastroesophageal reflux disease)    History of atrial flutter 02/2011   Converted to NSR with Cardizem   History of chronic bronchitis    History of hemolytic anemia 02/2011   secondary to Avelox   HOH (hard of hearing)    HOH (hard of hearing)    no eardrum and nerve damage on R, also HOH on L   Mitral valve prolapse    a. 2D Echo 11/27/14: EF 55-60%; images were inadequate for LV wall motion assessment, + mild late systolic mitral valve prolapse involving the anterior leaflet.   PAD (peripheral artery disease) ([place]) 04/2014   Dr Trula Slade; bilateral SFA occlusion, R mid, L distal   PAF (paroxysmal atrial fibrillation) ([place])    a. Dx 11/2014 during admission for perf ulcer.   Perforated ulcer ([place])    a. 11/2014 s/p surgery.   Productive cough    Smokers' cough ([place])    Type 1 diabetes mellitus ([place]) 1977   Past Surgical History:  Procedure Laterality Date   CARDIOVERSION N/A 01/20/2021   Procedure: CARDIOVERSION;  Surgeon: Fay Records, MD;  Location: [facility];  Service: Cardiovascular;  Laterality: N/A;   CYSTOSCOPY WITH URETEROSCOPY Right 08/14/2013   Procedure: CYSTOSCOPY WITH URETEROSCOPY BLADDER BIOPSY ;  Surgeon: Claybon Jabs, MD;  Location[facility];  Service: Urology;  Laterality: Right;   ESOPHAGOGASTRODUODENOSCOPY N/A 02/06/2013   Procedure: ESOPHAGOGASTRODUODENOSCOPY (EGD);  Surgeon: Lafayette Dragon, MD;  Location: [facility];  Service: Endoscopy;  Laterality: N/A;   LAPAROSCOPY N/A 11/25/2014   Procedure: LAPAROSCOPIC PRIMARY REPAIR OF PERFORATED PREPYLORIC ULCER WITH Silvestre Gunner;  Surgeon: Greer Pickerel, MD;  Location: [facility];  Service:  General;  Laterality: N/A;   TONSILLECTOMY  as child   TRANSTHORACIC ECHOCARDIOGRAM  02-17-2011   MODERATE LVH/  EF 65%   TRANSURETHRAL RESECTION OF BLADDER TUMOR WITH GYRUS (TURBT-GYRUS) N/A 06/12/2013   Procedure: TRANSURETHRAL RESECTION OF BLADDER TUMOR WITH GYRUS (TURBT-GYRUS);  Surgeon: Claybon Jabs, MD;  Location: [facility];  Service: Urology;  Laterality: N/A;   TYMPANIC MEMBRANE REPAIR  as child    Current Outpatient Medications  Medication Sig Dispense Refill   albuterol (VENTOLIN HFA) 108 (90 Base) MCG/ACT inhaler Inhale 2 puffs into the lungs every 6 (six) hours as needed for wheezing or shortness of breath. 18 g 2   cetirizine (ZYRTEC) 10 MG tablet Take 10 mg by mouth daily.     dofetilide (TIKOSYN) 250 MCG capsule Take 1 capsule (250 mcg total) by mouth 2 (two) times daily. 60 capsule 6   empagliflozin (JARDIANCE) 10 MG TABS tablet Take 1 tablet (10 mg total) by mouth daily before breakfast. 30 tablet 11   famotidine (PEPCID) 20 MG tablet Take 1 tablet (20 mg  total) by mouth every morning. Reported on 04/16/2015 (Patient taking differently: Take 20 mg by mouth daily before breakfast.) 90 tablet 1   furosemide (LASIX) 40 MG tablet Take 1 tablet (40 mg total) by mouth daily. 90 tablet 3   HYDROcodone-acetaminophen (NORCO/VICODIN) 5-325 MG tablet Take 1 tablet by mouth 3 (three) times daily as needed for severe pain. (Patient taking differently: Take 1 tablet by mouth in the morning, at noon, and at bedtime.) 90 tablet 0   insulin aspart (NOVOLOG) 100 UNIT/ML injection Per sliding scale 190-200=2u. 300 and above 7u (Patient taking differently: Inject 2-7 Units into the skin See admin instructions. Inject 2-7 units into the skin one to three times a day, PER SLIDING SCALE) 10 mL 1   insulin glargine (LANTUS) 100 UNIT/ML injection Inject 0.2 mLs (20 Units total) into the skin daily. (Patient taking differently: Inject 42 Units into the skin in the morning.) 30 mL 5    Insulin Syringe-Needle U-100 (B-D INS SYR ULTRAFINE 1CC/30G) 30G X 1/2" 1 ML MISC Use 4 times a day with insulin Dx E11.9 400 each 3   Insulin Syringes, Disposable, U-100 1 ML MISC Use 4 times a day for insulin injection Dx E11.9 400 each 5   ipratropium-albuterol (DUONEB) 0.5-2.5 (3) MG/3ML SOLN TAKE 3 MLS BY NEBULIZATION EVERY 4 (FOUR) HOURS AS NEEDED. 360 mL 0   KLOR-CON M20 20 MEQ tablet Take 20 mEq by mouth daily.     levalbuterol (XOPENEX) 0.31 MG/3ML nebulizer solution Take 3 mLs (0.31 mg total) by nebulization every 4 (four) hours as needed for wheezing. 3 mL 12   losartan (COZAAR) 50 MG tablet TAKE 1 TABLET BY MOUTH EVERY DAY (Patient taking differently: Take 50 mg by mouth at bedtime.) 90 tablet 3   metoprolol succinate (TOPROL-XL) 50 MG 24 hr tablet Take 1.5 tablets (75 mg total) by mouth 2 (two) times daily. Take with or immediately following a meal. 90 tablet 6   naloxone (NARCAN) nasal spray 4 mg/0.1 mL Place 1 spray into the nose as needed (For opiate overdose). 1 each 2   OVER THE COUNTER MEDICATION Take 1-2 tablets by mouth See admin instructions. Super Beets gummies- Chew 1-2 gummies by mouth every day     OXYGEN Inhale 3 L/min into the lungs continuous.     rivaroxaban (XARELTO) 20 MG TABS tablet Take 1 tablet (20 mg total) by mouth daily with supper. (Patient taking differently: Take 20 mg by mouth at bedtime.) 30 tablet 5   TRELEGY ELLIPTA 100-62.5-25 MCG/ACT AEPB Inhale 1 puff into the lungs 2 (two) times daily.     No current facility-administered medications for this encounter.    Allergies  Allergen Reactions   Avelox [Moxifloxacin Hcl In Nacl] Other (See Comments)    Hemolysis  In 2012   Azithromycin Other (See Comments) and Nausea And Vomiting    "Severe stomach cramps; told to list as an allergy by dr. Years ago"   Bactrim [Sulfamethoxazole-Trimethoprim] Diarrhea and Nausea And Vomiting    Social History   Socioeconomic History   Marital status: Married     Spouse name: Not on file   Number of children: Not on file   Years of education: Not on file   Highest education level: Not on file  Occupational History   Occupation: Tobacco Farmer  Tobacco Use   Smoking status: Every Day    Packs/day: 2.00    Years: 30.00    Total pack years: 60.00    Types: Cigarettes  Smokeless tobacco: Former    Quit date: 06/08/1978   Tobacco comments:    3/4 of a pack of cigarettes smoked daily [facility] 01/28/22  Vaping Use   Vaping Use: Never used  Substance and Sexual Activity   Alcohol use: Yes    Comment: 5 quarts per week 12/26/21   Drug use: No   Sexual activity: Not on file  Other Topics Concern   Not on file  Social History Narrative   Lives in [place] with wife and 2 sons.    Social Determinants of Health   Financial Resource Strain: Not on file  Food Insecurity: Unknown (11/27/2021)   Hunger Vital Sign    Worried About Running Out of Food in the Last Year: Patient refused    [place] in the Last Year: Patient refused  Transportation Needs: Unknown (11/27/2021)   PRAPARE - Hydrologist (Medical): Patient refused    Lack of Transportation (Non-Medical): Patient refused  Physical Activity: Not on file  Stress: Not on file  Social Connections: Not on file  Intimate Partner Violence: Unknown (11/27/2021)   Humiliation, Afraid, Rape, and Kick questionnaire    Fear of Current or Ex-Partner: Patient refused    Emotionally Abused: Patient refused    Physically Abused: Patient refused    Sexually Abused: Patient refused     ROS- All systems are reviewed and negative except as per the HPI above.  Physical Exam: Vitals:   02/26/22 1551  BP: (!) 162/76  Pulse: 62  Weight: 88.2 kg  Height: '5\' 8"'$  (1.727 m)     GEN- The patient is a well appearing male, alert and oriented x 3 today.   HEENT-head normocephalic, atraumatic, sclera clear, conjunctiva pink, hearing intact, trachea midline. Lungs- Clear to  ausculation bilaterally, on O2 nasal canula  Heart- Regular rate and rhythm, no murmurs, rubs or gallops  GI- soft, NT, ND, + BS Extremities- no clubbing, cyanosis, or edema MS- no significant deformity or atrophy Skin- no rash or lesion Psych- euthymic mood, full affect Neuro- strength and sensation are intact   Wt Readings from Last 3 Encounters:  02/26/22 88.2 kg  02/17/22 84.3 kg  02/17/22 88.1 kg    EKG today demonstrates  SR Vent. rate 62 BPM PR interval 150 ms QRS duration 82 ms QT/QTcB 432/438 ms  Echo 05/21/21 demonstrated   1. Left ventricular ejection fraction, by estimation, is 55 to 60%. The  left ventricle has normal function. The left ventricle has no regional  wall motion abnormalities. Left ventricular diastolic parameters are  consistent with Grade II diastolic dysfunction (pseudonormalization). Elevated left ventricular end-diastolic pressure.   2. Right ventricular systolic function is normal. The right ventricular  size is normal. There is normal pulmonary artery systolic pressure.   3. The mitral valve is normal in structure. No evidence of mitral valve  regurgitation. No evidence of mitral stenosis.   4. The aortic valve is tricuspid. Aortic valve regurgitation is not  visualized. No aortic stenosis is present.   5. The inferior vena cava is normal in size with greater than 50%  respiratory variability, suggesting right atrial pressure of 3 mmHg.   Epic records are reviewed at length today  CHA2DS2-VASc Score = 4  The patient's score is based upon: CHF History: 1 HTN History: 1 Diabetes History: 1 Stroke History: 0 Vascular Disease History: 1 Age Score: 0 Gender Score: 0       ASSESSMENT AND PLAN: 1. Persistent  Atrial Fibrillation/atrial flutter The patient's CHA2DS2-VASc score is 4, indicating a 4.8% annual risk of stroke.   S/p dofetilide admission 12/12-12/15/23. Patient has chemically converted. DCCV not necessary at this time.   Continue dofetilide 250 mcg BID. QT stable.  Check bmet/mag today.  Continue Xarelto 20 mg daily  Continue Toprol 100 mg daily  2. Secondary Hypercoagulable State (ICD10:  D68.69) The patient is at significant risk for stroke/thromboembolism based upon his CHA2DS2-VASc Score of 4.  Continue Rivaroxaban (Xarelto).   3. HFrEF EF recovered to 55-60% Fluid status appears stable today.  4. HTN Stable, no changes today.  5. PAD Followed by Dr Trula Slade at [facility] Patient continues to smoke.  6. CAD Severe 3-vessel coronary calcifications seen on CT Coronary CT cancelled due to rapid afib.   Follow up in the AF clinic in one month.    [facility] [address] [place],  [address] [phone] 02/26/2022 4:18 PM

## 2022-02-26 NOTE — Telephone Encounter (Signed)
Oral drug screen for this encounter is consistent for prescribed medication.

## 2022-02-27 ENCOUNTER — Encounter: Payer: Self-pay | Admitting: Emergency Medicine

## 2022-02-27 ENCOUNTER — Ambulatory Visit: Payer: BC Managed Care – PPO | Admitting: Emergency Medicine

## 2022-02-27 ENCOUNTER — Other Ambulatory Visit: Payer: BC Managed Care – PPO

## 2022-02-27 VITALS — BP 130/76 | HR 65 | Temp 98.4°F | Ht 69.0 in | Wt 187.8 lb

## 2022-02-27 DIAGNOSIS — R911 Solitary pulmonary nodule: Secondary | ICD-10-CM

## 2022-02-27 DIAGNOSIS — J441 Chronic obstructive pulmonary disease with (acute) exacerbation: Secondary | ICD-10-CM

## 2022-02-27 DIAGNOSIS — J4489 Other specified chronic obstructive pulmonary disease: Secondary | ICD-10-CM | POA: Diagnosis not present

## 2022-02-27 DIAGNOSIS — Z720 Tobacco use: Secondary | ICD-10-CM | POA: Diagnosis not present

## 2022-02-27 DIAGNOSIS — J9611 Chronic respiratory failure with hypoxia: Secondary | ICD-10-CM | POA: Diagnosis not present

## 2022-02-27 DIAGNOSIS — J439 Emphysema, unspecified: Secondary | ICD-10-CM

## 2022-02-27 MED ORDER — DOXYCYCLINE HYCLATE 100 MG PO TABS: 100.0000 mg | ORAL_TABLET | Freq: Two times a day (BID) | ORAL | 6 refills | Status: DC

## 2022-02-27 MED ORDER — CEFUROXIME AXETIL 250 MG PO TABS: 250.0000 mg | ORAL_TABLET | Freq: Two times a day (BID) | ORAL | 6 refills | Status: DC

## 2022-02-27 NOTE — Addendum Note (Signed)
Addended by: Gavin Potters R on: 02/27/2022 04:03 PM   Modules accepted: Orders

## 2022-02-27 NOTE — Assessment & Plan Note (Signed)
Continues to smoke.  I discussed this with him in detail.  Unclear that we will get full control over his bronchitic symptoms as long as he continues.  For now I think is reasonable to start rotating antibiotics monthly, first week.  Will avoid azithromycin since he is on dofetilide.  Try alternating doxycycline and cefuroxime.  Continue Trelegy, scheduled DuoNeb.  He has Xopenex nebs available to use if needed.  Continue Trelegy 1 inhalation once daily.  Rinse and gargle after using. Use your DuoNeb 4 times a day on a schedule Use your Xopenex (levalbuterol) nebulizer up to every 6 hours if needed for shortness of breath We will start antibiotics that you will take for the first week of alternating months: -Doxycycline 100 mg twice daily for 7 days for the first week of month  -Cefuroxime 250 mg twice daily for 7 days for the first week of the next month -Continue to rotate You need to work on decreasing your cigarettes.  This is the main factor that is impacting your cough, shortness of breath, progressive COPD. Follow with APP in 3 months Follow Dr. Lamonte Sakai in 6 months or sooner if you have any problems.

## 2022-02-27 NOTE — Assessment & Plan Note (Signed)
Still smoking.  Not ready to set a quit date or cut down.

## 2022-02-27 NOTE — Assessment & Plan Note (Signed)
Repeat CT scan of the chest in February 2024 to follow pulmonary nodule for interval stability.  If it changes, enlarges then we will perform PET scan and consider diagnostic workup.

## 2022-02-27 NOTE — Progress Notes (Signed)
Subjective:    Patient ID: Thomas Sandoval., male    DOB: 07-May-1955, 66 y.o.   MRN: [id number]  HPI  ROV 01/28/22 --follow-up visit 66 year old man with active tobacco abuse and associated COPD, chronic bronchitic symptoms.  I been following him for an abnormal CT scan of the chest with pulmonary nodular disease, initial scan identifying a 1.7 cm left upper lobe nodule in the setting of a right lower lobe pneumonia June 2023.  The pneumonia had cleared in September, left-sided pulmonary nodules remained, question more cavitary but stable in size.  I treated him with a more extended course of antibiotics for possible small abscess.  He returns today following repeat CT done 11/14. He has cut his smoking down to 0.75 pk/day. On trelegy, using albuterol . There has been a URI running through the  family - he has had it as well. He is having more cough, more exertional SOB. Having nasal congestion and drainage - still having it.   CT chest 01/20/2022 reviewed by me, shows cylindrical bronchiectasis particularly in the right lower lobe, associated moderate centrilobular emphysema.  The 1.7 cm left upper lobe pulmonary nodule has not significantly changed in size, is less cavitary.  Satellite left upper lobe nodule 6 mm is unchanged as are other scattered bilateral pulmonary nodules.   Hospital follow-up visit 02/27/2022 --[name] is 66 and follows up today for his history of active tobacco use (back to 1 pack daily), associated COPD with chronic bronchitic symptoms.  I been following him for this as well as pulmonary nodular disease that was ultimately most consistent with a resolving cavitary pneumonia.  He has cylindrical bronchiectasis especially in the right lower lobe, 1.7 cm left upper lobe pulmonary nodule and a 6 mm satellite left upper lobe nodule.  He was seen in our office 02/11/2022 with purulent sputum, nasal congestion, chest congestion and dyspnea.  Dr. Lake Bells prescribed Augmentin and  prednisone.  The patient took the Augmentin but not to prednisone because he tolerates it poorly.   Unfortunately recently hospitalized with persistent atrial fibrillation.  He was loaded on dofetilide and discharge from the hospital 02/20/2022 with plans to follow-up in cardiology (still in A-fib).  He is currently managed on Trelegy, has levalbuterol and DuoNeb to use as needed - uses DuoNeb 4x a day on a schedule.  Uses Zyrtec, remains on Xarelto, Lasix 40, dofetilide. Today he reports that his cough and mucous have improved since he was treated. The cough frequency is a bit better. He is able to exert some. O2 at 3L/min, sleeps w it on.  Still smoking a pack or more daily   Review of Systems As per HPI  Past Medical History:  Diagnosis Date   Arthritis    Bladder cancer ([place])    Chronic back pain    Chronic combined systolic and diastolic CHF (congestive heart failure) ([place]) 07/16/2021   COPD (chronic obstructive pulmonary disease) (HCC)    DDD (degenerative disc disease)    Diabetic retinopathy of both eyes (HCC)    Essential hypertension    GERD (gastroesophageal reflux disease)    History of atrial flutter 02/2011   Converted to NSR with Cardizem   History of chronic bronchitis    History of hemolytic anemia 02/2011   secondary to Avelox   HOH (hard of hearing)    HOH (hard of hearing)    no eardrum and nerve damage on R, also HOH on L   Mitral valve prolapse  a. 2D Echo 11/27/14: EF 55-60%; images were inadequate for LV wall motion assessment, + mild late systolic mitral valve prolapse involving the anterior leaflet.   PAD (peripheral artery disease) ([place]) 04/2014   Dr Trula Slade; bilateral SFA occlusion, R mid, L distal   PAF (paroxysmal atrial fibrillation) ([place])    a. Dx 11/2014 during admission for perf ulcer.   Perforated ulcer ([place])    a. 11/2014 s/p surgery.   Productive cough    Smokers' cough ([place])    Type 1 diabetes mellitus ([place]) 1977     Family History   Problem Relation Age of Onset   Breast cancer Mother    Cancer Mother        Breast   Rheumatic fever Father    Heart disease Father    Heart attack Father        Massive    Diabetes Son      Social History   Socioeconomic History   Marital status: Married    Spouse name: Not on file   Number of children: Not on file   Years of education: Not on file   Highest education level: Not on file  Occupational History   Occupation: Tobacco Psychologist, sport and exercise  Tobacco Use   Smoking status: Every Day    Packs/day: 2.00    Years: 30.00    Total pack years: 60.00    Types: Cigarettes   Smokeless tobacco: Former    Quit date: 06/08/1978   Tobacco comments:    1  pack of cigarettes smoked daily [facility] 02/27/22  Vaping Use   Vaping Use: Never used  Substance and Sexual Activity   Alcohol use: Yes    Comment: 5 quarts per week 12/26/21   Drug use: No   Sexual activity: Not on file  Other Topics Concern   Not on file  Social History Narrative   Lives in [place] with wife and 2 sons.    Social Determinants of Health   Financial Resource Strain: Not on file  Food Insecurity: Unknown (11/27/2021)   Hunger Vital Sign    Worried About Running Out of Food in the Last Year: Patient refused    [place] in the Last Year: Patient refused  Transportation Needs: Unknown (11/27/2021)   PRAPARE - Hydrologist (Medical): Patient refused    Lack of Transportation (Non-Medical): Patient refused  Physical Activity: Not on file  Stress: Not on file  Social Connections: Not on file  Intimate Partner Violence: Unknown (11/27/2021)   Humiliation, Afraid, Rape, and Kick questionnaire    Fear of Current or Ex-Partner: Patient refused    Emotionally Abused: Patient refused    Physically Abused: Patient refused    Sexually Abused: Patient refused     Allergies  Allergen Reactions   Avelox [Moxifloxacin Hcl In Nacl] Other (See Comments)    Hemolysis  In 2012   Azithromycin  Other (See Comments) and Nausea And Vomiting    "Severe stomach cramps; told to list as an allergy by dr. Huel Cote ago"   Bactrim [Sulfamethoxazole-Trimethoprim] Diarrhea and Nausea And Vomiting     Outpatient Medications Prior to Visit  Medication Sig Dispense Refill   albuterol (VENTOLIN HFA) 108 (90 Base) MCG/ACT inhaler Inhale 2 puffs into the lungs every 6 (six) hours as needed for wheezing or shortness of breath. 18 g 2   cetirizine (ZYRTEC) 10 MG tablet Take 10 mg by mouth daily.     dofetilide (TIKOSYN) 250  MCG capsule Take 1 capsule (250 mcg total) by mouth 2 (two) times daily. 60 capsule 1   empagliflozin (JARDIANCE) 10 MG TABS tablet Take 1 tablet (10 mg total) by mouth daily before breakfast. 30 tablet 11   famotidine (PEPCID) 20 MG tablet Take 1 tablet (20 mg total) by mouth every morning. Reported on 04/16/2015 (Patient taking differently: Take 20 mg by mouth daily before breakfast.) 90 tablet 1   furosemide (LASIX) 40 MG tablet Take 1 tablet (40 mg total) by mouth daily. 90 tablet 3   HYDROcodone-acetaminophen (NORCO/VICODIN) 5-325 MG tablet Take 1 tablet by mouth 3 (three) times daily as needed for severe pain. (Patient taking differently: Take 1 tablet by mouth in the morning, at noon, and at bedtime.) 90 tablet 0   insulin aspart (NOVOLOG) 100 UNIT/ML injection Per sliding scale 190-200=2u. 300 and above 7u (Patient taking differently: Inject 2-7 Units into the skin See admin instructions. Inject 2-7 units into the skin one to three times a day, PER SLIDING SCALE) 10 mL 1   insulin glargine (LANTUS) 100 UNIT/ML injection Inject 0.2 mLs (20 Units total) into the skin daily. (Patient taking differently: Inject 42 Units into the skin in the morning.) 30 mL 5   Insulin Syringe-Needle U-100 (B-D INS SYR ULTRAFINE 1CC/30G) 30G X 1/2" 1 ML MISC Use 4 times a day with insulin Dx E11.9 400 each 3   Insulin Syringes, Disposable, U-100 1 ML MISC Use 4 times a day for insulin injection Dx E11.9 400  each 5   ipratropium-albuterol (DUONEB) 0.5-2.5 (3) MG/3ML SOLN TAKE 3 MLS BY NEBULIZATION EVERY 4 (FOUR) HOURS AS NEEDED. 360 mL 0   KLOR-CON M20 20 MEQ tablet Take 20 mEq by mouth daily.     levalbuterol (XOPENEX) 0.31 MG/3ML nebulizer solution Take 3 mLs (0.31 mg total) by nebulization every 4 (four) hours as needed for wheezing. 3 mL 12   losartan (COZAAR) 50 MG tablet TAKE 1 TABLET BY MOUTH EVERY DAY (Patient taking differently: Take 50 mg by mouth at bedtime.) 90 tablet 3   metoprolol succinate (TOPROL-XL) 50 MG 24 hr tablet Take 1.5 tablets (75 mg total) by mouth 2 (two) times daily. Take with or immediately following a meal. 90 tablet 6   naloxone (NARCAN) nasal spray 4 mg/0.1 mL Place 1 spray into the nose as needed (For opiate overdose). 1 each 2   OVER THE COUNTER MEDICATION Take 1-2 tablets by mouth See admin instructions. Super Beets gummies- Chew 1-2 gummies by mouth every day     OXYGEN Inhale 3 L/min into the lungs continuous.     rivaroxaban (XARELTO) 20 MG TABS tablet Take 1 tablet (20 mg total) by mouth daily with supper. (Patient taking differently: Take 20 mg by mouth at bedtime.) 30 tablet 5   TRELEGY ELLIPTA 100-62.5-25 MCG/ACT AEPB Inhale 1 puff into the lungs 2 (two) times daily.     No facility-administered medications prior to visit.         Objective:   Physical Exam  Vitals:   02/27/22 1520  BP: 130/76  Pulse: 65  Temp: 98.4 F (36.9 C)  TempSrc: Oral  SpO2: 97%  Weight: 187 lb 12.8 oz (85.2 kg)  Height: '5\' 9"'$  (1.753 m)   Gen: Pleasant, well-nourished, in no distress,  normal affect, frequent cough  ENT: No lesions,  mouth clear,  oropharynx clear, no postnasal drip, very poor hearing   Neck: No JVD, no stridor  Lungs: No use of accessory muscles, coarse  bilaterally with bilateral rhonchi and expiratory wheezes n  Cardiovascular: RRR, heart sounds normal, no murmur or gallops, no peripheral edema  Musculoskeletal: No deformities, no cyanosis or  clubbing  Neuro: alert, awake, non focal  Skin: Warm, no lesions or rash     Assessment & Plan:  COPD with chronic bronchitis and emphysema ([place]) Continues to smoke.  I discussed this with him in detail.  Unclear that we will get full control over his bronchitic symptoms as long as he continues.  For now I think is reasonable to start rotating antibiotics monthly, first week.  Will avoid azithromycin since he is on dofetilide.  Try alternating doxycycline and cefuroxime.  Continue Trelegy, scheduled DuoNeb.  He has Xopenex nebs available to use if needed.  Continue Trelegy 1 inhalation once daily.  Rinse and gargle after using. Use your DuoNeb 4 times a day on a schedule Use your Xopenex (levalbuterol) nebulizer up to every 6 hours if needed for shortness of breath We will start antibiotics that you will take for the first week of alternating months: -Doxycycline 100 mg twice daily for 7 days for the first week of month  -Cefuroxime 250 mg twice daily for 7 days for the first week of the next month -Continue to rotate You need to work on decreasing your cigarettes.  This is the main factor that is impacting your cough, shortness of breath, progressive COPD. Follow with APP in 3 months Follow Dr. Lamonte Sakai in 6 months or sooner if you have any problems.  Nodule of left lung Repeat CT scan of the chest in February 2024 to follow pulmonary nodule for interval stability.  If it changes, enlarges then we will perform PET scan and consider diagnostic workup.  Tobacco use Still smoking.  Not ready to set a quit date or cut down.  Chronic respiratory failure with hypoxia (HCC) Continue oxygen 3 L/min at all times.  Encouraged him to maintain good compliance   Baltazar Apo, MD, PhD 02/27/2022, 3:58 PM [facility] [phone] or if no answer before 7:00PM call [phone] For any issues after 7:00PM please call eLink [phone]

## 2022-02-27 NOTE — Patient Instructions (Signed)
Continue Trelegy 1 inhalation once daily.  Rinse and gargle after using. Use your DuoNeb 4 times a day on a schedule Use your Xopenex (levalbuterol) nebulizer up to every 6 hours if needed for shortness of breath Wear your oxygen reliably at 3 L/min at all times We will start antibiotics that you will take for the first week of alternating months: -Doxycycline 100 mg twice daily for 7 days for the first week of month  -Cefuroxime 250 mg twice daily for 7 days for the first week of the next month -Continue to rotate You need to work on decreasing your cigarettes.  This is the main factor that is impacting your cough, shortness of breath, progressive COPD. Follow with APP in 3 months Follow Dr. Lamonte Sakai in 6 months or sooner if you have any problems.

## 2022-02-27 NOTE — Assessment & Plan Note (Signed)
Continue oxygen 3 L/min at all times.  Encouraged him to maintain good compliance

## 2022-03-03 ENCOUNTER — Encounter: Payer: BC Managed Care – PPO | Admitting: Physical Medicine & Rehabilitation

## 2022-03-04 ENCOUNTER — Encounter (HOSPITAL_BASED_OUTPATIENT_CLINIC_OR_DEPARTMENT_OTHER): Payer: BC Managed Care – PPO

## 2022-03-06 ENCOUNTER — Other Ambulatory Visit: Payer: BC Managed Care – PPO

## 2022-03-12 ENCOUNTER — Encounter
Payer: BC Managed Care – PPO | Attending: Physical Medicine and Rehabilitation | Admitting: Physical Medicine & Rehabilitation

## 2022-03-12 ENCOUNTER — Encounter: Payer: Self-pay | Admitting: Physical Medicine & Rehabilitation

## 2022-03-12 VITALS — BP 110/68 | HR 62 | Ht 69.0 in | Wt 196.0 lb

## 2022-03-12 DIAGNOSIS — M47816 Spondylosis without myelopathy or radiculopathy, lumbar region: Secondary | ICD-10-CM | POA: Diagnosis not present

## 2022-03-12 MED ORDER — HYDROCODONE-ACETAMINOPHEN 5-325 MG PO TABS: 1.0000 | ORAL_TABLET | Freq: Three times a day (TID) | ORAL | 0 refills | Status: DC | PRN

## 2022-03-12 NOTE — Progress Notes (Signed)
Subjective:    Patient ID: Thomas Sandoval., male    DOB: May 13, 1955, 67 y.o.   MRN: [id number]  HPI  CC:  Chronic low back pain  68 yo male who has a several year hx of low back pain which does not radiate to the legs. No recent imaging studies  Has been evaluated by Dr. Lorin Mercy from orthopedic spine, x-rays were performed in October.  Patient has a known history of) left hip osteoarthritis. Seen by Dr. Tressa Busman for from physical medicine and rehab referred to interventional pain for evaluation of medial branch blocks versus epidural steroid injections. Patient does not have lower extremity pain radiating from the back.  He states at one time he did. He describes his pain as being midline just below the belt line.  He states he feels very tight in his right hip but otherwise does not have much pain. Pain worsens with standing and walking Pain Inventory Average Pain 6 Pain Right Now 6 My pain is sharp, burning, dull, stabbing, tingling, and aching  In the last 24 hours, has pain interfered with the following? General activity 6 Relation with others 0 Enjoyment of life 0 What TIME of day is your pain at its worst? morning , daytime, evening, and night Sleep (in general) Fair  Pain is worse with: walking, bending, sitting, standing, and some activites Pain improves with: rest and medication Relief from Meds: 6  Family History  Problem Relation Age of Onset   Breast cancer Mother    Cancer Mother        Breast   Rheumatic fever Father    Heart disease Father    Heart attack Father        Massive    Diabetes Son    Social History   Socioeconomic History   Marital status: Married    Spouse name: Not on file   Number of children: Not on file   Years of education: Not on file   Highest education level: Not on file  Occupational History   Occupation: Tobacco Psychologist, sport and exercise  Tobacco Use   Smoking status: Every Day    Packs/day: 2.00    Years: 30.00    Total pack years: 60.00     Types: Cigarettes   Smokeless tobacco: Former    Quit date: 06/08/1978   Tobacco comments:    1  pack of cigarettes smoked daily [facility] 02/27/22  Vaping Use   Vaping Use: Never used  Substance and Sexual Activity   Alcohol use: Yes    Comment: 5 quarts per week 12/26/21   Drug use: No   Sexual activity: Not on file  Other Topics Concern   Not on file  Social History Narrative   Lives in [place] with wife and 2 sons.    Social Determinants of Health   Financial Resource Strain: Not on file  Food Insecurity: Unknown (11/27/2021)   Hunger Vital Sign    Worried About Running Out of Food in the Last Year: Patient refused    [place] in the Last Year: Patient refused  Transportation Needs: Unknown (11/27/2021)   PRAPARE - Hydrologist (Medical): Patient refused    Lack of Transportation (Non-Medical): Patient refused  Physical Activity: Not on file  Stress: Not on file  Social Connections: Not on file   Past Surgical History:  Procedure Laterality Date   CARDIOVERSION N/A 01/20/2021   Procedure: CARDIOVERSION;  Surgeon: Fay Records, MD;  Location: [facility];  Service: Cardiovascular;  Laterality: N/A;   CYSTOSCOPY WITH URETEROSCOPY Right 08/14/2013   Procedure: CYSTOSCOPY WITH URETEROSCOPY BLADDER BIOPSY ;  Surgeon: Claybon Jabs, MD;  Location: [facility];  Service: Urology;  Laterality: Right;   ESOPHAGOGASTRODUODENOSCOPY N/A 02/06/2013   Procedure: ESOPHAGOGASTRODUODENOSCOPY (EGD);  Surgeon: Lafayette Dragon, MD;  [facility] END[facility]COPY;  Service: Endoscopy;  Laterality: N/A;   LAPAROSCOPY N/A 11/25/2014   Procedure: LAPAROSCOPIC PRIMARY REPAIR OF PERFORATED PREPYLORIC ULCER WITH Silvestre Gunner;  Surgeon: Greer Pickerel, MD;  Location: [facility];  Service: General;  Laterality: N/A;   TONSILLECTOMY  as child   TRANSTHORACIC ECHOCARDIOGRAM  02-17-2011   MODERATE LVH/  EF 65%   TRANSURETHRAL RESECTION OF BLADDER TUMOR WITH GYRUS  (TURBT-GYRUS) N/A 06/12/2013   Procedure: TRANSURETHRAL RESECTION OF BLADDER TUMOR WITH GYRUS (TURBT-GYRUS);  Surgeon: Claybon Jabs, MD;  Location: [facility];  Service: Urology;  Laterality: N/A;   TYMPANIC MEMBRANE REPAIR  as child   Past Surgical History:  Procedure Laterality Date   CARDIOVERSION N/A 01/20/2021   Procedure: CARDIOVERSION;  Surgeon: Fay Records, MD;  Location: [place] [facility];  Service: Cardiovascular;  Laterality: N/A;   CYSTOSCOPY WITH URETEROSCOPY Right 08/14/2013   Procedure: CYSTOSCOPY WITH URETEROSCOPY BLADDER BIOPSY ;  Surgeon: Claybon Jabs, MD;  Location: [facility];  Service: Urology;  Laterality: Right;   ESOPHAGOGASTRODUODENOSCOPY N/A 02/06/2013   Procedure: ESOPHAGOGASTRODUODENOSCOPY (EGD);  Surgeon: Lafayette Dragon, MD;  Location: [facility] ENDOSCOPY;  Service: Endoscopy;  Laterality: N/A;   LAPAROSCOPY N/A 11/25/2014   Procedure: LAPAROSCOPIC PRIMARY REPAIR OF PERFORATED PREPYLORIC ULCER WITH Silvestre Gunner;  Surgeon: Greer Pickerel, MD;  Location: [facility];  Service: General;  Laterality: N/A;   TONSILLECTOMY  as child   TRANSTHORACIC ECHOCARDIOGRAM  02-17-2011   MODERATE LVH/  EF 65%   TRANSURETHRAL RESECTION OF BLADDER TUMOR WITH GYRUS (TURBT-GYRUS) N/A 06/12/2013   Procedure: TRANSURETHRAL RESECTION OF BLADDER TUMOR WITH GYRUS (TURBT-GYRUS);  Surgeon: Claybon Jabs, MD;  Location: [facility];  Service: Urology;  Laterality: N/A;   TYMPANIC MEMBRANE REPAIR  as child   Past Medical History:  Diagnosis Date   Arthritis    Bladder cancer ([place])    Chronic back pain    Chronic combined systolic and diastolic CHF (congestive heart failure) ([place]) 07/16/2021   COPD (chronic obstructive pulmonary disease) (HCC)    DDD (degenerative disc disease)    Diabetic retinopathy of both eyes (HCC)    Essential hypertension    GERD (gastroesophageal reflux disease)    History of atrial flutter 02/2011   Converted to NSR with Cardizem    History of chronic bronchitis    History of hemolytic anemia 02/2011   secondary to Avelox   HOH (hard of hearing)    HOH (hard of hearing)    no eardrum and nerve damage on R, also HOH on L   Mitral valve prolapse    a. 2D Echo 11/27/14: EF 55-60%; images were inadequate for LV wall motion assessment, + mild late systolic mitral valve prolapse involving the anterior leaflet.   PAD (peripheral artery disease) ([place]) 04/2014   Dr Trula Slade; bilateral SFA occlusion, R mid, L distal   PAF (paroxysmal atrial fibrillation) ([place])    a. Dx 11/2014 during admission for perf ulcer.   Perforated ulcer ([place])    a. 11/2014 s/p surgery.   Productive cough    Smokers' cough (HCC)    Type 1 diabetes mellitus (  [place]) 1977   BP 110/68   Pulse 62   Ht '5\' 9"'$  (1.753 m)   Wt 196 lb (88.9 kg)   SpO2 92% Comment: 3l  BMI 28.94 kg/m   Opioid Risk Score:   Fall Risk Score:  `1  Depression screen PHQ 2/9     03/12/2022    2:54 PM 02/10/2022   10:29 AM 02/02/2022    2:35 PM 11/17/2021    9:30 AM 04/28/2021    2:17 PM 01/24/2021    2:25 PM 12/17/2020    2:28 PM  Depression screen PHQ 2/9  Decreased Interest 0 0 0 0 0 0 0  Down, Depressed, Hopeless 0 0 0 0 0 0 0  PHQ - 2 Score 0 0 0 0 0 0 0  Altered sleeping   0 1 0  0  Tired, decreased energy   0 1 0  0  Change in appetite   0 0 0  0  Feeling bad or failure about yourself    0 0 0  0  Trouble concentrating   0 0 0  0  Moving slowly or fidgety/restless   0 0 0  0  Suicidal thoughts   0 0 0  0  PHQ-9 Score   0 2 0  0  Difficult doing work/chores   Not difficult at all  Not difficult at all        Review of Systems  Musculoskeletal:  Positive for back pain.  All other systems reviewed and are negative.     Objective:   Physical Exam Vitals and nursing note reviewed.  Constitutional:      Appearance: He is normal weight.  HENT:     Head: Normocephalic and atraumatic.     Mouth/Throat:     Mouth: Mucous membranes are moist.  Eyes:      Extraocular Movements: Extraocular movements intact.     Conjunctiva/sclera: Conjunctivae normal.     Pupils: Pupils are equal, round, and reactive to light.  Musculoskeletal:     Comments: Mild kyphosis no significant scoliosis noted on examination Negative thigh thrust test bilaterally FABERs positive at the right groin Reduced right hip internal/external rotation  Skin:    General: Skin is warm and dry.  Neurological:     Mental Status: He is alert and oriented to person, place, and time.     Comments: Negative straight leg raise bilaterally Motor strength is 5/5 bilateral hip flexor knee extensor ankle dorsiflexor Sensation intact to light touch bilateral thighs and legs Ambulates with a forward flexed posture but no evidence of toe drag or knee instability  Psychiatric:        Mood and Affect: Mood normal.        Behavior: Behavior normal.           Assessment & Plan:  1.  Chronic low back pain no longer having any sciatic symptoms.  His pain is worsened with extension and in fact has a forward flexed posture due to chronic pain.  Exam and symptoms most consistent with lumbar spondylosis.  We discussed a trial of medial branch blocks L3-L4 as well as L5 dorsal ramus injection to evaluate for symptomatic pain generator. The patient will think about this.  We discussed that 2 sets of diagnostic blocks would be needed with a greater than 50% relief on a short-term basis prior to proceeding with the radiofrequency neurotomy.  Patient will follow-up with Dr. Tressa Busman who is his primary physical medicine and  rehabilitation physician, hydrocodone 5 mg 3 times daily refilled today

## 2022-03-12 NOTE — Patient Instructions (Signed)
Main side effect bleeding bruising or infection

## 2022-03-17 ENCOUNTER — Ambulatory Visit: Payer: BC Managed Care – PPO | Admitting: Cardiology

## 2022-03-18 ENCOUNTER — Ambulatory Visit: Payer: BC Managed Care – PPO | Admitting: Cardiology

## 2022-03-18 NOTE — Progress Notes (Deleted)
Cardiology Office Note:    Date:  03/18/2022   ID:  Thomas Medici., DOB August 10, 1955, MRN [id number]  PCP:  Chevis Pretty, FNP  Cardiologist:  Donato Heinz, MD  Electrophysiologist:  Vickie Epley, MD   Referring MD: Hassell Done, [name], *   No chief complaint on file.   History of Present Illness:    Thomas Sandoval. is a 67 y.o. male with a hx of alcohol abuse, tobacco abuse, COPD, perforated gastric ulcer status postrepair in 2016, T2DM, hypertension, PAD, MVP, atrial fibrillation/flutter, systolic heart failure who presents for follow-up.  He was hospitalized September 2022 with acute pancreatitis found to be in A. fib.  Echo at that time showed EF 30 to 35%.  He left AMA.  At follow-up with his PCP 12/17/2020 he was still in A. fib and started on Xarelto and diltiazem.  He was referred to A. fib clinic, seen on 12/25/2020.  He underwent cardioversion on 01/20/2021 with successful restoration of sinus rhythm.  Echocardiogram 08/26/2017 showed EF 55 to 60%, mild mitral valve prolapse.  Echo 12/05/2020 showed EF 30 to 35%, global hypokinesis, and low normal RV function, moderate left atrial enlargement, trivial MR.  Zio patch x3 days on 02/10/2021 showed high percent A. fib burden with average rate 95 bpm.  Echocardiogram 05/21/2021 showed EF 55 to 123456, grade 2 diastolic dysfunction, normal RV function, no significant valvular disease.  He was admitted 11/2021 after being found unresponsive with significant hypoglycemia and hypotension.  EKG with aVR elevation and diffuse ST depressions, suggesting global ischemia.  EKG normalized with correction of his hypoglycemia.  He was seen by cardiology and outpatient coronary CT was recommended.  Coronary CTA was attempted but he was in A-fib on presentation and CT was canceled.  He was admitted for Tikosyn loading 02/2022.  Converted to sinus chemically but reverted back to A-fib prior to discharge.  Unfortunately left  AMA prior to discharge.  Since last clinic visit,  ***PET, lipid panel he reports recently started on treatment for COPD exacerbation with prednisone and doxycycline.  Taking Lasix 40 mg daily.  Reports stable lower extremity edema.  Denies any chest pain.  Wt Readings from Last 3 Encounters:  03/12/22 196 lb (88.9 kg)  02/27/22 187 lb 12.8 oz (85.2 kg)  02/26/22 194 lb 6.4 oz (88.2 kg)    Past Medical History:  Diagnosis Date   Arthritis    Bladder cancer ([place])    Chronic back pain    Chronic combined systolic and diastolic CHF (congestive heart failure) ([place]) 07/16/2021   COPD (chronic obstructive pulmonary disease) (H[facility])    DDD (degenerative disc disease)    Diabetic retinopathy of both eyes ([facility])    Essential hypertension    GERD (gastroesophageal reflux disease)    History of atrial flutter 02/2011   Converted to NSR with Cardizem   History of chronic bronchitis    History of hemolytic anemia 02/2011   secondary to Avelox   HOH (hard of hearing)    HOH (hard of hearing)    no eardrum and nerve damage on R, also HOH on L   Mitral valve prolapse    a. 2D Echo 11/27/14: EF 55-60%; images were inadequate for LV wall motion assessment, + mild late systolic mitral valve prolapse involving the anterior leaflet.   PAD (peripheral artery disease) ([place]) 04/2014   Dr Trula Slade; bilateral SFA occlusion, R mid, L distal   PAF (paroxysmal atrial fibrillation) ([place])    a.  Dx 11/2014 during admission for perf ulcer.   Perforated ulcer ([place])    a. 11/2014 s/p surgery.   Productive cough    Smokers' cough ([place])    Type 1 diabetes mellitus ([place]) 1977    Past Surgical History:  Procedure Laterality Date   CARDIOVERSION N/A 01/20/2021   Procedure: CARDIOVERSION;  Surgeon: Fay Records, MD;  Location: [facility];  Service: Cardiovascular;  Laterality: N/A;   CYSTOSCOPY WITH URETEROSCOPY Right 08/14/2013   Procedure: CYSTOSCOPY WITH URETEROSCOPY BLADDER BIOPSY ;  Surgeon: Claybon Jabs,  MD;  Location[facility];  Service: Urology;  Laterality: Right;   ESOPHAGOGASTRODUODENOSCOPY N/A 02/06/2013   Procedure: ESOPHAGOGASTRODUODENOSCOPY (EGD);  Surgeon: Lafayette Dragon, MD;  Location: [facility];  Service: Endoscopy;  Laterality: N/A;   LAPAROSCOPY N/A 11/25/2014   Procedure: LAPAROSCOPIC PRIMARY REPAIR OF PERFORATED PREPYLORIC ULCER WITH Silvestre Gunner;  Surgeon: Greer Pickerel, MD;  Location: [facility];  Service: General;  Laterality: N/A;   TONSILLECTOMY  as child   TRANSTHORACIC ECHOCARDIOGRAM  02-17-2011   MODERATE LVH/  EF 65%   TRANSURETHRAL RESECTION OF BLADDER TUMOR WITH GYRUS (TURBT-GYRUS) N/A 06/12/2013   Procedure: TRANSURETHRAL RESECTION OF BLADDER TUMOR WITH GYRUS (TURBT-GYRUS);  Surgeon: Claybon Jabs, MD;  Location: [facility];  Service: Urology;  Laterality: N/A;   TYMPANIC MEMBRANE REPAIR  as child    Current Medications: No outpatient medications have been marked as taking for the 03/18/22 encounter (Appointment) with Donato Heinz, MD.     Allergies:   Avelox [moxifloxacin hcl in nacl], Azithromycin, and Bactrim [sulfamethoxazole-trimethoprim]   Social History   Socioeconomic History   Marital status: Married    Spouse name: Not on file   Number of children: Not on file   Years of education: Not on file   Highest education level: Not on file  Occupational History   Occupation: Tobacco Farmer  Tobacco Use   Smoking status: Every Day    Packs/day: 2.00    Years: 30.00    Total pack years: 60.00    Types: Cigarettes   Smokeless tobacco: Former    Quit date: 06/08/1978   Tobacco comments:    1  pack of cigarettes smoked daily [facility] 02/27/22  Vaping Use   Vaping Use: Never used  Substance and Sexual Activity   Alcohol use: Yes    Comment: 5 quarts per week 12/26/21   Drug use: No   Sexual activity: Not on file  Other Topics Concern   Not on file  Social History Narrative   Lives in [place] with wife and 2 sons.     Social Determinants of Health   Financial Resource Strain: Not on file  Food Insecurity: Unknown (11/27/2021)   Hunger Vital Sign    Worried About Running Out of Food in the Last Year: Patient refused    Ran Out of Food in the Last Year: Patient refused  Transportation Needs: Unknown (11/27/2021)   PRAPARE - Hydrologist (Medical): Patient refused    Lack of Transportation (Non-Medical): Patient refused  Physical Activity: Not on file  Stress: Not on file  Social Connections: Not on file     Family History: The patient's family history includes Breast cancer in his mother; Cancer in his mother; Diabetes in his son; Heart attack in his father; Heart disease in his father; Rheumatic fever in his father.  ROS:   Please see the history of present illness.  All other systems reviewed and are negative.  EKGs/Labs/Other Studies Reviewed:    The following studies were reviewed today:   EKG:   02/04/22: Atrial fibrillation, rate 104 11/19/2021: Normal sinus rhythm, rate 72, left axis deviation 08/20/2021: Atrial flutter with variable conduction, rate 87 05/20/21: Normal sinus rhythm, rate 61, left axis deviation, nonspecific T wave flattening, Q waves in leads III, aVF 03/14/21:NSR, ST depression<1 mm in inferior leads and V4-6, rate 61  Recent Labs: 08/17/2021: TSH 0.322 02/02/2022: ALT 14; Hemoglobin 15.5; Platelets 329 02/11/2022: B Natriuretic Peptide 886.2 02/26/2022: BUN 16; Creatinine, Ser 1.48; Magnesium 2.0; Potassium 4.8; Sodium 136  Recent Lipid Panel    Component Value Date/Time   CHOL 104 02/02/2022 1426   TRIG 98 02/02/2022 1426   HDL 48 02/02/2022 1426   CHOLHDL 2.2 02/02/2022 1426   LDLCALC 37 02/02/2022 1426    Physical Exam:    VS:  There were no vitals taken for this visit.    Wt Readings from Last 3 Encounters:  03/12/22 196 lb (88.9 kg)  02/27/22 187 lb 12.8 oz (85.2 kg)  02/26/22 194 lb 6.4 oz (88.2 kg)     GEN:  Well  nourished, well developed in no acute distress HEENT: Normal NECK: No JVD; No carotid bruits CARDIAC: RRR, no murmurs, rubs, gallops RESPIRATORY:  Clear to auscultation without rales, wheezing or rhonchi  ABDOMEN: Soft, non-tender, non-distended MUSCULOSKELETAL: 1+ edema; No deformity  SKIN: Warm and dry NEUROLOGIC:  Alert and oriented x 3 PSYCHIATRIC:  Normal affect   ASSESSMENT:    No diagnosis found.    PLAN:    Persistent atrial fibrillation/flutter: CHA2DS2-VASc score 4.  Underwent successful cardioversion on 01/20/2021, but was back in A. fib at follow-up appointment on 01/24/2021.  Zio patch x3 days on 02/10/2021 showed 100% percent A. fib burden with average rate 95 bpm.  Subsequently converted to sinus rhythm, but was in a flutter at clinic appointment 08/20/2021.  He is now back in Afib -Continue Xarelto 20 mg daily -Continue Toprol-XL 100 mg daily -Given possible tachycardia induced cardiomyopathy, would recommend rhythm control strategy.  Referred to EP to consider ablation versus antiarrhythmic.  Seen by Dr Quentin Ore.  Admitted for Tikosyn loading 02/2022.  Currently on Tikosyn 250 mcg twice daily.  Chronic combined systolic and diastolic heart failure: EF 30 to 35% on echocardiogram during admission with acute pancreatitis in September 2022.  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  Echocardiogram 05/21/2021 showed EF 55 to 60% -Continue Lasix 40 mg daily.   -Continue Toprol-XL 100 mg daily -Continue losartan 50 mg daily -Continue Jardiance 10 mg daily  CAD: He was admitted 11/2021 after being found unresponsive with significant hypoglycemia and hypotension.  EKG with aVR elevation and diffuse ST depressions, suggesting global ischemia.  EKG normalized with correction of his hypoglycemia.  He was seen by cardiology and outpatient coronary CT was recommended.  He denies any chest pain but having dyspnea on exertion -Coronary CTA was attempted but he was in A-fib on  presentation and was canceled.  Given CT chest 01/2022 with severe coronary calcifications, recommend PET for further evaluation  Alcohol abuse: Counseled risk of alcohol abuse and cessation recommended.  He initially stopped drinking after admission in September 2022, but reports he has been drinking recently.  Cessation recommended.  Tobacco use: Patient counseled on the risk of tobacco use and cessation strongly encouraged  Hypertension: Continue Toprol-XL, losartan  T2DM: On insulin  PAD: ABIs 2019 are severely reduced (right 0.52, left 0.48).  Lower  extremity duplex 04/2014 showed bilateral SFA occlusions.  ABIs 02/2022 showed right 0.36, left 0.45.  Follows with Dr. Trula Slade in [facility]  Hyperlipidemia: Needs to be on statin given CAD/PAD.  Check lipid panel  RTC in***   Medication Adjustments/Labs and Tests Ordered: Current medicines are reviewed at length with the patient today.  Concerns regarding medicines are outlined above.  No orders of the defined types were placed in this encounter.   No orders of the defined types were placed in this encounter.   There are no Patient Instructions on file for this visit.   Signed, Donato Heinz, MD  03/18/2022 6:15 AM    [place]

## 2022-03-21 DIAGNOSIS — J9601 Acute respiratory failure with hypoxia: Secondary | ICD-10-CM | POA: Diagnosis not present

## 2022-03-24 ENCOUNTER — Other Ambulatory Visit: Payer: Self-pay | Admitting: Emergency Medicine

## 2022-03-24 DIAGNOSIS — J189 Pneumonia, unspecified organism: Secondary | ICD-10-CM

## 2022-03-24 DIAGNOSIS — J449 Chronic obstructive pulmonary disease, unspecified: Secondary | ICD-10-CM

## 2022-03-24 DIAGNOSIS — R946 Abnormal results of thyroid function studies: Secondary | ICD-10-CM

## 2022-03-26 ENCOUNTER — Ambulatory Visit (HOSPITAL_COMMUNITY)
Admission: RE | Admit: 2022-03-26 | Discharge: 2022-03-26 | Disposition: A | Discharge status: Home / Self Care | Payer: BC Managed Care – PPO | Source: Ambulatory Visit | Attending: Physician Assistant | Admitting: Physician Assistant

## 2022-03-26 ENCOUNTER — Encounter (HOSPITAL_COMMUNITY): Payer: Self-pay | Admitting: Physician Assistant

## 2022-03-26 VITALS — BP 102/60 | HR 60 | Ht 69.0 in | Wt 192.2 lb

## 2022-03-26 DIAGNOSIS — I4892 Unspecified atrial flutter: Secondary | ICD-10-CM | POA: Diagnosis not present

## 2022-03-26 DIAGNOSIS — I2510 Atherosclerotic heart disease of native coronary artery without angina pectoris: Secondary | ICD-10-CM | POA: Diagnosis not present

## 2022-03-26 DIAGNOSIS — F17210 Nicotine dependence, cigarettes, uncomplicated: Secondary | ICD-10-CM | POA: Diagnosis not present

## 2022-03-26 DIAGNOSIS — I5042 Chronic combined systolic (congestive) and diastolic (congestive) heart failure: Secondary | ICD-10-CM | POA: Diagnosis not present

## 2022-03-26 DIAGNOSIS — D6869 Other thrombophilia: Secondary | ICD-10-CM

## 2022-03-26 DIAGNOSIS — Z79899 Other long term (current) drug therapy: Secondary | ICD-10-CM | POA: Diagnosis not present

## 2022-03-26 DIAGNOSIS — E1051 Type 1 diabetes mellitus with diabetic peripheral angiopathy without gangrene: Secondary | ICD-10-CM | POA: Diagnosis not present

## 2022-03-26 DIAGNOSIS — I110 Hypertensive heart disease with heart failure: Secondary | ICD-10-CM | POA: Insufficient documentation

## 2022-03-26 DIAGNOSIS — J449 Chronic obstructive pulmonary disease, unspecified: Secondary | ICD-10-CM | POA: Diagnosis not present

## 2022-03-26 DIAGNOSIS — Z7901 Long term (current) use of anticoagulants: Secondary | ICD-10-CM | POA: Insufficient documentation

## 2022-03-26 DIAGNOSIS — I4819 Other persistent atrial fibrillation: Secondary | ICD-10-CM | POA: Diagnosis not present

## 2022-03-26 DIAGNOSIS — K219 Gastro-esophageal reflux disease without esophagitis: Secondary | ICD-10-CM | POA: Diagnosis not present

## 2022-03-26 LAB — BASIC METABOLIC PANEL
Anion gap: 10 (ref 5–15)
BUN: 30 mg/dL — ABNORMAL HIGH (ref 8–23)
CO2: 29 mmol/L (ref 22–32)
Calcium: 9.4 mg/dL (ref 8.9–10.3)
Chloride: 94 mmol/L — ABNORMAL LOW (ref 98–111)
Creatinine, Ser: 2.03 mg/dL — ABNORMAL HIGH (ref 0.61–1.24)
GFR, Estimated: 36 mL/min — ABNORMAL LOW (ref >60)
Glucose, Bld: 192 mg/dL — ABNORMAL HIGH (ref 70–99)
Potassium: 3.3 mmol/L — ABNORMAL LOW (ref 3.5–5.1)
Sodium: 133 mmol/L — ABNORMAL LOW (ref 135–145)

## 2022-03-26 LAB — MAGNESIUM: Magnesium: 2 mg/dL (ref 1.7–2.4)

## 2022-03-26 NOTE — Progress Notes (Signed)
Primary Care Physician: Chevis Pretty, FNP Primary Cardiologist: Dr Gardiner Rhyme Primary Electrophysiologist: Dr Quentin Ore Referring Physician: Darla Lesches NP   [name] is a 67 y.o. male with a history of alcohol abuse, tobacco abuse, COPD, MVP, perforated gastric ulcer s/p repair 2016, DM, HTN, PAD (bilateral fem-pop occlusion), GERD, systolic CHF, atrial flutter, atrial fibrillation who presents for follow up in the [facility]. The patient was initially diagnosed with atrial flutter in 2012 and afib in the setting of his perforated ulcer. Patient has a CHADS2VASC score of 4, has declined anticoagulation in the past. He was hospitalized 9/26-9/30/22 with acute alcoholic pancreatitis and was also found to be in afib at the time. Echo showed EF 30-35%. He left the hospital AMA. At his visit with his PCP 12/17/20 he was still in afib and was started on diltiazem and Xarelto. Patient is s/p DCCV on 01/20/21 but unfortunately was back in afib by follow up on 01/24/21. He did not feel any different in SR.   Patient seen by Dr Quentin Ore 03/26/21 and discussed ablation but patient deferred at that time. He was admitted 07/2021 for community acquired pneumonia and rapid atrial flutter. He remained in atrial flutter at his visit with Dr Gardiner Rhyme on 08/20/21.  Patient was admitted 11/2021 after being found unresponsive by family. They described possible seizure-like activity. It is unclear whether he had witnessed syncope.  There is no report of prodromal chest pain or shortness of breath.  On EMS arrival he was unresponsive, was hypoglycemic (fingerstick blood sugar 48) and severely hypotensive (60/40 mmHg).  He received a liter of fluid, Narcan (without improvement) and intravenous dextrose.  His glucose increased to over 200 but he remained unresponsive.  His ECG shows diffuse ST segment depression in all leads except aVR where there is mild ST segment elevation.  Symptoms felt to be related to hypoglycemia. Ischemic workup was not pursued at that time. He was in afib but converted to SR prior to discharge. Patient was scheduled to have cardiac CT but was back in afib at the time and the scan was cancelled.   Patient is s/p dofetilide admission 12/12-12/15/23. He converted chemically but reverted back to afib prior to discharge. Prior to pt receiving meds or discharge instructions pt left AMA, without signing paperwork or even having his IV removed. Pt was becoming edgy and just decided he was ready to leave. EP team discussed importance of compliance with medication and follow up. He has converted to SR since discharge.   On follow up today, patient reports that he has done reasonably well. He bought a Electrical engineer which has shown intermittent afib, nothing persistent. No bleeding issues on anticoagulation.   Today, he denies symptoms of palpitations, chest pain, orthopnea, PND,  dizziness, presyncope, syncope, snoring, daytime somnolence, bleeding, or neurologic sequela. The patient is tolerating medications without difficulties and is otherwise without complaint today.    Atrial Fibrillation Risk Factors:  he does not have symptoms or diagnosis of sleep apnea. he does not have a history of rheumatic fever. he does have a history of alcohol use. The patient does not have a history of early familial atrial fibrillation or other arrhythmias.  he has a BMI of Body mass index is 28.38 kg/m.Marland Kitchen Filed Weights   03/26/22 1509  Weight: 87.2 kg    Family History  Problem Relation Age of Onset   Breast cancer Mother    Cancer Mother  Breast   Rheumatic fever Father    Heart disease Father    Heart attack Father        Massive    Diabetes Son     Atrial Fibrillation Management history:  Previous antiarrhythmic drugs: dofetilide  Previous cardioversions: 08/25/17, 01/20/21 Previous ablations: none CHADS2VASC score: 4 Anticoagulation history:  Xarelto    Past Medical History:  Diagnosis Date   Arthritis    Bladder cancer ([place])    Chronic back pain    Chronic combined systolic and diastolic CHF (congestive heart failure) ([place]) 07/16/2021   COPD (chronic obstructive pulmonary disease) (H[facility])    DDD (degenerative disc disease)    Diabetic retinopathy of both eyes ([place])    Essential hypertension    GERD (gastroesophageal reflux disease)    History of atrial flutter 02/2011   Converted to NSR with Cardizem   History of chronic bronchitis    History of hemolytic anemia 02/2011   secondary to Avelox   HOH (hard of hearing)    HOH (hard of hearing)    no eardrum and nerve damage on R, also HOH on L   Mitral valve prolapse    a. 2D Echo 11/27/14: EF 55-60%; images were inadequate for LV wall motion assessment, + mild late systolic mitral valve prolapse involving the anterior leaflet.   PAD (peripheral artery disease) ([place]) 04/2014   Dr Trula Slade; bilateral SFA occlusion, R mid, L distal   PAF (paroxysmal atrial fibrillation) ([place])    a. Dx 11/2014 during admission for perf ulcer.   Perforated ulcer ([place])    a. 11/2014 s/p surgery.   Productive cough    Smokers' cough ([place])    Type 1 diabetes mellitus ([place]) 1977   Past Surgical History:  Procedure Laterality Date   CARDIOVERSION N/A 01/20/2021   Procedure: CARDIOVERSION;  Surgeon: Fay Records, MD;  Location: [place];  Service: Cardiovascular;  Laterality: N/A;   CYSTOSCOPY WITH URETEROSCOPY Right 08/14/2013   Procedure: CYSTOSCOPY WITH URETEROSCOPY BLADDER BIOPSY ;  Surgeon: Claybon Jabs, MD;  [facility];  Service: Urology;  Laterality: Right;   ESOPHAGOGASTRODUODENOSCOPY N/A 02/06/2013   Procedure: ESOPHAGOGASTRODUODENOSCOPY (EGD);  Surgeon: Lafayette Dragon, MD;  Location: [facility] ENDOSCOPY;  Service: Endoscopy;  Laterality: N/A;   LAPAROSCOPY N/A 11/25/2014   Procedure: LAPAROSCOPIC PRIMARY REPAIR OF PERFORATED PREPYLORIC ULCER WITH Silvestre Gunner;   Surgeon: Greer Pickerel, MD;  Location: [facility];  Service: General;  Laterality: N/A;   TONSILLECTOMY  as child   TRANSTHORACIC ECHOCARDIOGRAM  02-17-2011   MODERATE LVH/  EF 65%   TRANSURETHRAL RESECTION OF BLADDER TUMOR WITH GYRUS (TURBT-GYRUS) N/A 06/12/2013   Procedure: TRANSURETHRAL RESECTION OF BLADDER TUMOR WITH GYRUS (TURBT-GYRUS);  Surgeon: Claybon Jabs, MD;  Location: [facility];  Service: Urology;  Laterality: N/A;   TYMPANIC MEMBRANE REPAIR  as child    Current Outpatient Medications  Medication Sig Dispense Refill   albuterol (VENTOLIN HFA) 108 (90 Base) MCG/ACT inhaler Inhale 2 puffs into the lungs every 6 (six) hours as needed for wheezing or shortness of breath. 18 g 2   cefUROXime (CEFTIN) 250 MG tablet Take 1 tablet (250 mg total) by mouth 2 (two) times daily with a meal. 14 tablet 6   cetirizine (ZYRTEC) 10 MG tablet Take 10 mg by mouth daily.     dofetilide (TIKOSYN) 250 MCG capsule Take 1 capsule (250 mcg total) by mouth 2 (two) times daily. 60 capsule 1   doxycycline (VIBRA-TABS) 100  MG tablet Take 1 tablet (100 mg total) by mouth 2 (two) times daily. 14 tablet 6   empagliflozin (JARDIANCE) 10 MG TABS tablet Take 1 tablet (10 mg total) by mouth daily before breakfast. 30 tablet 11   famotidine (PEPCID) 20 MG tablet Take 1 tablet (20 mg total) by mouth every morning. Reported on 04/16/2015 90 tablet 1   furosemide (LASIX) 40 MG tablet Take 1 tablet (40 mg total) by mouth daily. 90 tablet 3   HYDROcodone-acetaminophen (NORCO/VICODIN) 5-325 MG tablet Take 1 tablet by mouth 3 (three) times daily as needed for severe pain. 90 tablet 0   insulin aspart (NOVOLOG) 100 UNIT/ML injection Per sliding scale 190-200=2u. 300 and above 7u (Patient taking differently: Inject 2-7 Units into the skin See admin instructions. Inject 2-7 units into the skin one to three times a day, PER SLIDING SCALE) 10 mL 1   insulin glargine (LANTUS) 100 UNIT/ML injection Inject 0.2 mLs (20 Units  total) into the skin daily. (Patient taking differently: Inject 42 Units into the skin in the morning.) 30 mL 5   Insulin Syringe-Needle U-100 (B-D INS SYR ULTRAFINE 1CC/30G) 30G X 1/2" 1 ML MISC Use 4 times a day with insulin Dx E11.9 400 each 3   Insulin Syringes, Disposable, U-100 1 ML MISC Use 4 times a day for insulin injection Dx E11.9 400 each 5   ipratropium-albuterol (DUONEB) 0.5-2.5 (3) MG/3ML SOLN TAKE 3 MLS BY NEBULIZATION EVERY 4 (FOUR) HOURS AS NEEDED. 360 mL 5   KLOR-CON M20 20 MEQ tablet Take 20 mEq by mouth daily.     levalbuterol (XOPENEX) 0.31 MG/3ML nebulizer solution Take 3 mLs (0.31 mg total) by nebulization every 4 (four) hours as needed for wheezing. 3 mL 12   losartan (COZAAR) 50 MG tablet TAKE 1 TABLET BY MOUTH EVERY DAY 90 tablet 3   metoprolol succinate (TOPROL-XL) 50 MG 24 hr tablet Take 1.5 tablets (75 mg total) by mouth 2 (two) times daily. Take with or immediately following a meal. 90 tablet 6   naloxone (NARCAN) nasal spray 4 mg/0.1 mL Place 1 spray into the nose as needed (For opiate overdose). 1 each 2   OVER THE COUNTER MEDICATION Take 1-2 tablets by mouth See admin instructions. Super Beets gummies- Chew 1-2 gummies by mouth every day     OXYGEN Inhale 3 L/min into the lungs continuous.     rivaroxaban (XARELTO) 20 MG TABS tablet Take 1 tablet (20 mg total) by mouth daily with supper. 30 tablet 5   TRELEGY ELLIPTA 100-62.5-25 MCG/ACT AEPB Inhale 1 puff into the lungs 2 (two) times daily.     No current facility-administered medications for this encounter.    Allergies  Allergen Reactions   Avelox [Moxifloxacin Hcl In Nacl] Other (See Comments)    Hemolysis  In 2012   Azithromycin Other (See Comments) and Nausea And Vomiting    "Severe stomach cramps; told to list as an allergy by dr. Years ago"   Bactrim [Sulfamethoxazole-Trimethoprim] Diarrhea and Nausea And Vomiting    Social History   Socioeconomic History   Marital status: Married    Spouse name:  Not on file   Number of children: Not on file   Years of education: Not on file   Highest education level: Not on file  Occupational History   Occupation: Tobacco Farmer  Tobacco Use   Smoking status: Every Day    Packs/day: 1.00    Years: 30.00    Total pack years: 30.00  Types: Cigarettes   Smokeless tobacco: Former    Quit date: 06/08/1978   Tobacco comments:    1  pack of cigarettes smoked daily [facility] 02/27/22  Vaping Use   Vaping Use: Never used  Substance and Sexual Activity   Alcohol use: Yes    Comment: 5 quarts per week 12/26/21   Drug use: No   Sexual activity: Not on file  Other Topics Concern   Not on file  Social History Narrative   Lives in [place] with wife and 2 sons.    Social Determinants of Health   Financial Resource Strain: Not on file  Food Insecurity: Unknown (11/27/2021)   Hunger Vital Sign    Worried About Running Out of Food in the Last Year: Patient refused    [place] in the Last Year: Patient refused  Transportation Needs: Unknown (11/27/2021)   PRAPARE - Hydrologist (Medical): Patient refused    Lack of Transportation (Non-Medical): Patient refused  Physical Activity: Not on file  Stress: Not on file  Social Connections: Not on file  Intimate Partner Violence: Unknown (11/27/2021)   Humiliation, Afraid, Rape, and Kick questionnaire    Fear of Current or Ex-Partner: Patient refused    Emotionally Abused: Patient refused    Physically Abused: Patient refused    Sexually Abused: Patient refused     ROS- All systems are reviewed and negative except as per the HPI above.  Physical Exam: Vitals:   03/26/22 1509  BP: 102/60  Pulse: 60  Weight: 87.2 kg  Height: '5\' 9"'$  (1.753 m)     GEN- The patient is a well appearing male, alert and oriented x 3 today.   HEENT-head normocephalic, atraumatic, sclera clear, conjunctiva pink, hearing intact, trachea midline. Lungs- Clear to ausculation bilaterally,  normal work of breathing Heart- Regular rate and rhythm, no murmurs, rubs or gallops  GI- soft, NT, ND, + BS Extremities- no clubbing, cyanosis, or edema MS- no significant deformity or atrophy Skin- no rash or lesion Psych- euthymic mood, full affect Neuro- strength and sensation are intact   Wt Readings from Last 3 Encounters:  03/26/22 87.2 kg  03/12/22 88.9 kg  02/27/22 85.2 kg    EKG today demonstrates  SR Vent. rate 60 BPM PR interval 162 ms QRS duration 82 ms QT/QTcB 478/478 ms  Echo 05/21/21 demonstrated   1. Left ventricular ejection fraction, by estimation, is 55 to 60%. The  left ventricle has normal function. The left ventricle has no regional  wall motion abnormalities. Left ventricular diastolic parameters are  consistent with Grade II diastolic dysfunction (pseudonormalization). Elevated left ventricular end-diastolic pressure.   2. Right ventricular systolic function is normal. The right ventricular  size is normal. There is normal pulmonary artery systolic pressure.   3. The mitral valve is normal in structure. No evidence of mitral valve  regurgitation. No evidence of mitral stenosis.   4. The aortic valve is tricuspid. Aortic valve regurgitation is not  visualized. No aortic stenosis is present.   5. The inferior vena cava is normal in size with greater than 50%  respiratory variability, suggesting right atrial pressure of 3 mmHg.   Epic records are reviewed at length today  CHA2DS2-VASc Score = 4  The patient's score is based upon: CHF History: 1 HTN History: 1 Diabetes History: 1 Stroke History: 0 Vascular Disease History: 1 Age Score: 0 Gender Score: 0       ASSESSMENT AND PLAN: 1.  Persistent Atrial Fibrillation/atrial flutter The patient's CHA2DS2-VASc score is 4, indicating a 4.8% annual risk of stroke.   S/p dofetilide admission 12/12-12/15/23. Patient maintaining SR for the majority of the time, intermittent episodes on his Kardia  mobile.  Continue dofetilide 250 mcg BID. QT stable.  Check bmet/mag today.  Continue Xarelto 20 mg daily  Continue Toprol 100 mg daily  2. Secondary Hypercoagulable State (ICD10:  D68.69) The patient is at significant risk for stroke/thromboembolism based upon his CHA2DS2-VASc Score of 4.  Continue Rivaroxaban (Xarelto).   3. HFrEF EF recovered to 55-60% Appears euvolemic today.  4. HTN Stable, no changes today.  5. PAD Followed by Dr Trula Slade at [facility] Patient continues to smoke.  6. CAD Severe 3-vessel coronary calcifications seen on CT Coronary CT cancelled due to rapid afib. Patient deferred rescheduling today. He would like to wait until his insurance changes in July.    Follow up with Dr Gardiner Rhyme as scheduled. AF clinic in 3 months.    [facility] [address] [place], [place] [address] [phone] 03/26/2022 3:20 PM

## 2022-03-27 MED ORDER — FUROSEMIDE 40 MG PO TABS: 20.0000 mg | ORAL_TABLET | Freq: Every day | ORAL | 3 refills | Status: DC

## 2022-03-27 NOTE — Addendum Note (Signed)
Encounter addended by: Juluis Mire, RN on: 03/27/2022 11:19 AM  Actions taken: Order list changed, Diagnosis association updated

## 2022-03-30 LAB — RESPIRATORY CULTURE OR RESPIRATORY AND SPUTUM CULTURE
MICRO NUMBER:: 14350786
RESULT:: NORMAL
SPECIMEN QUALITY:: ADEQUATE

## 2022-03-30 LAB — FUNGUS CULTURE W SMEAR
MICRO NUMBER:: 14350785
SMEAR:: NONE SEEN
SPECIMEN QUALITY:: ADEQUATE

## 2022-03-31 ENCOUNTER — Ambulatory Visit: Payer: BC Managed Care – PPO | Admitting: Cardiology

## 2022-04-02 ENCOUNTER — Telehealth: Payer: Self-pay | Admitting: Emergency Medicine

## 2022-04-02 NOTE — Telephone Encounter (Signed)
Called and spoke with patient. He verbalized understanding. He is aware to call us back if he has not noticed any improvement.   Nothing further needed at time of call.

## 2022-04-02 NOTE — Telephone Encounter (Signed)
Called and spoke with patient. He stated that his cough has increased over the past few days. He has been coughing up thick, yellow phlegm. Increased wheezing and SOB.   At the last OV with RB, he was advised to start antibiotics at the first week of each month. He started with doxy for 7 days this morning and he is schedule to start Ceftin for 7 days at the beginning of next month.   He wanted to know if it would be ok for him to start early on the Ceftin due to his cough. I advised him that I believe it would be safe to do so but I would double check with RB. He verbalized understanding.   Dr. Lamonte Sakai, can you please advise? Thanks!

## 2022-04-02 NOTE — Telephone Encounter (Signed)
Yes it is ok for him to start the ceftin early. He needs to be seen if he is not improving

## 2022-04-10 ENCOUNTER — Other Ambulatory Visit (HOSPITAL_COMMUNITY): Payer: Self-pay | Admitting: Physician Assistant

## 2022-04-16 ENCOUNTER — Ambulatory Visit
Admission: RE | Admit: 2022-04-16 | Discharge: 2022-04-16 | Disposition: A | Discharge status: Home / Self Care | Payer: BC Managed Care – PPO | Source: Ambulatory Visit | Attending: Emergency Medicine | Admitting: Emergency Medicine

## 2022-04-16 DIAGNOSIS — R911 Solitary pulmonary nodule: Secondary | ICD-10-CM | POA: Diagnosis not present

## 2022-04-16 DIAGNOSIS — I700 Atherosclerosis of aorta: Secondary | ICD-10-CM | POA: Diagnosis not present

## 2022-04-16 DIAGNOSIS — J439 Emphysema, unspecified: Secondary | ICD-10-CM | POA: Diagnosis not present

## 2022-04-16 DIAGNOSIS — J449 Chronic obstructive pulmonary disease, unspecified: Secondary | ICD-10-CM | POA: Diagnosis not present

## 2022-04-17 ENCOUNTER — Telehealth: Payer: Self-pay | Admitting: Emergency Medicine

## 2022-04-17 MED ORDER — PREDNISONE 10 MG PO TABS: ORAL_TABLET | ORAL | 0 refills | Status: AC

## 2022-04-17 NOTE — Telephone Encounter (Signed)
There is not another antibiotic that is going to be any more effective or give better coverage than the Ceftin. I think it may be reasonable to treat him with a prednisone taper.  If he is willing to do so please order > Take 35m daily for 3 days, then 323mdaily for 3 days, then 2085maily for 3 days, then 69m51mily for 3 days, then stop

## 2022-04-17 NOTE — Telephone Encounter (Signed)
Called and spoke with pt letting him know recs per RB and he verbalized understanding. Pred taper has been sent to preferred pharmacy for pt. Nothing further needed.

## 2022-04-17 NOTE — Telephone Encounter (Signed)
Spoke with patient states he has a productive cough with brown phlegm, wheezing, sob. States symptoms began after last round of antibiotic about a week now. Patient was offered a appointment, but declined stated he did not feel like going anywhere. States he was given Ceftin last time and this did not really help. Pt is wanting to know if he can get another kind of antibiotic.  Dr. Lamonte Sakai please advise  [place]

## 2022-04-19 NOTE — Progress Notes (Signed)
HPI Pain Inventory Average Pain 6 Pain Right Now 6 My pain is sharp and burning  In the last 24 hours, has pain interfered with the following? General activity 6 Relation with others 0 Enjoyment of life 0 What TIME of day is your pain at its worst? morning , daytime, and evening Sleep (in general) Fair  Pain is worse with: walking, bending, and standing Pain improves with: rest and medication Relief from Meds: 6  Family History  Problem Relation Age of Onset   Breast cancer Mother    Cancer Mother        Breast   Rheumatic fever Father    Heart disease Father    Heart attack Father        Massive    Diabetes Son    Social History   Socioeconomic History   Marital status: Married    Spouse name: Not on file   Number of children: Not on file   Years of education: Not on file   Highest education level: Not on file  Occupational History   Occupation: Tobacco Psychologist, sport and exercise  Tobacco Use   Smoking status: Every Day    Packs/day: 1.00    Years: 30.00    Total pack years: 30.00    Types: Cigarettes   Smokeless tobacco: Former    Quit date: 06/08/1978   Tobacco comments:    1  pack of cigarettes smoked daily [facility] 02/27/22  Vaping Use   Vaping Use: Never used  Substance and Sexual Activity   Alcohol use: Yes    Comment: 5 quarts per week 12/26/21   Drug use: No   Sexual activity: Not on file  Other Topics Concern   Not on file  Social History Narrative   Lives in [place] with wife and 2 sons.    Social Determinants of Health   Financial Resource Strain: Not on file  Food Insecurity: Unknown (11/27/2021)   Hunger Vital Sign    Worried About Running Out of Food in the Last Year: Patient refused    [place] in the Last Year: Patient refused  Transportation Needs: Unknown (11/27/2021)   PRAPARE - Hydrologist (Medical): Patient refused    Lack of Transportation (Non-Medical): Patient refused  Physical Activity: Not on file  Stress: Not  on file  Social Connections: Not on file   Past Surgical History:  Procedure Laterality Date   CARDIOVERSION N/A 01/20/2021   Procedure: CARDIOVERSION;  Surgeon: Fay Records, MD;  Location: [place];  Service: Cardiovascular;  Laterality: N/A;   CYSTOSCOPY WITH URETEROSCOPY Right 08/14/2013   Procedure: CYSTOSCOPY WITH URETEROSCOPY BLADDER BIOPSY ;  Surgeon: Claybon Jabs, MD;  Location: [facility];  Service: Urology;  Laterality: Right;   ESOPHAGOGASTRODUODENOSCOPY N/A 02/06/2013   Procedure: ESOPHAGOGASTRODUODENOSCOPY (EGD);  Surgeon: Lafayette Dragon, MD;  Location: [facility] ENDOSCOPY;  Service: Endoscopy;  Laterality: N/A;   LAPAROSCOPY N/A 11/25/2014   Procedure: LAPAROSCOPIC PRIMARY REPAIR OF PERFORATED PREPYLORIC ULCER WITH Silvestre Gunner;  Surgeon: Greer Pickerel, MD;  Location: [place];  Service: General;  Laterality: N/A;   TONSILLECTOMY  as child   TRANSTHORACIC ECHOCARDIOGRAM  02-17-2011   MODERATE LVH/  EF 65%   TRANSURETHRAL RESECTION OF BLADDER TUMOR WITH GYRUS (TURBT-GYRUS) N/A 06/12/2013   Procedure: TRANSURETHRAL RESECTION OF BLADDER TUMOR WITH GYRUS (TURBT-GYRUS);  Surgeon: Claybon Jabs, MD;  Location[facility];  Service: Urology;  Laterality: N/A;   TYMPANIC MEMBRANE REPAIR  as child   Past Surgical History:  Procedure Laterality Date   CARDIOVERSION N/A 01/20/2021   Procedure: CARDIOVERSION;  Surgeon: Fay Records, MD;  Location: [place];  Service: Cardiovascular;  Laterality: N/A;   CYSTOSCOPY WITH URETEROSCOPY Right 08/14/2013   Procedure: CYSTOSCOPY WITH URETEROSCOPY BLADDER BIOPSY ;  Surgeon: Claybon Jabs, MD;  Location: [facility];  Service: Urology;  Laterality: Right;   ESOPHAGOGASTRODUODENOSCOPY N/A 02/06/2013   Procedure: ESOPHAGOGASTRODUODENOSCOPY (EGD);  Surgeon: Lafayette Dragon, MD;  Location: [facility] ENDOSCOPY;  Service: Endoscopy;  Laterality: N/A;   LAPAROSCOPY N/A 11/25/2014   Procedure: LAPAROSCOPIC PRIMARY REPAIR OF  PERFORATED PREPYLORIC ULCER WITH Silvestre Gunner;  Surgeon: Greer Pickerel, MD;  Location: [place];  Service: General;  Laterality: N/A;   TONSILLECTOMY  as child   TRANSTHORACIC ECHOCARDIOGRAM  02-17-2011   MODERATE LVH/  EF 65%   TRANSURETHRAL RESECTION OF BLADDER TUMOR WITH GYRUS (TURBT-GYRUS) N/A 06/12/2013   Procedure: TRANSURETHRAL RESECTION OF BLADDER TUMOR WITH GYRUS (TURBT-GYRUS);  Surgeon: Claybon Jabs, MD;  Location[facility];  Service: Urology;  Laterality: N/A;   TYMPANIC MEMBRANE REPAIR  as child   Past Medical History:  Diagnosis Date   Arthritis    Bladder cancer ([place])    Chronic back pain    Chronic combined systolic and diastolic CHF (congestive heart failure) ([place]) 07/16/2021   COPD (chronic obstructive pulmonary disease) (HCC)    DDD (degenerative disc disease)    Diabetic retinopathy of both eyes (HCC)    Essential hypertension    GERD (gastroesophageal reflux disease)    History of atrial flutter 02/07/2011   Converted to NSR with Cardizem   History of chronic bronchitis    History of hemolytic anemia 02/07/2011   secondary to Avelox   HOH (hard of hearing)    HOH (hard of hearing)    no eardrum and nerve damage on R, also HOH on L   Mild renal insufficiency 08/25/2017   Mitral valve prolapse    a. 2D Echo 11/27/14: EF 55-60%; images were inadequate for LV wall motion assessment, + mild late systolic mitral valve prolapse involving the anterior leaflet.   PAD (peripheral artery disease) ([place]) 04/09/2014   Dr Trula Slade; bilateral SFA occlusion, R mid, L distal   PAF (paroxysmal atrial fibrillation) ([place])    a. Dx 11/2014 during admission for perf ulcer.   Perforated ulcer ([place])    a. 11/2014 s/p surgery.   Productive cough    Smokers' cough (HCC)    Type 1 diabetes mellitus ([place]) 03/10/1975   BP (!) 154/63   Pulse 60   Ht 5' 9"$  (1.753 m)   Wt 185 lb (83.9 kg)   SpO2 98%   BMI 27.32 kg/m   Opioid Risk Score:   Fall Risk Score:   `1  Depression screen [facility] 2/9     04/20/2022    2:52 PM 03/12/2022    2:54 PM 02/10/2022   10:29 AM 02/02/2022    2:35 PM 11/17/2021    9:30 AM 04/28/2021    2:17 PM 01/24/2021    2:25 PM  Depression screen PHQ 2/9  Decreased Interest 0 0 0 0 0 0 0  Down, Depressed, Hopeless 0 0 0 0 0 0 0  PHQ - 2 Score 0 0 0 0 0 0 0  Altered sleeping    0 1 0   Tired, decreased energy    0 1 0   Change in appetite  0 0 0   Feeling bad or failure about yourself     0 0 0   Trouble concentrating    0 0 0   Moving slowly or fidgety/restless    0 0 0   Suicidal thoughts    0 0 0   PHQ-9 Score    0 2 0   Difficult doing work/chores    Not difficult at all  Not difficult at all     Thomas Medici. is a 67 y.o. year old male  who  has a past medical history of Arthritis, Bladder cancer ([place]), Chronic back pain, Chronic combined systolic and diastolic CHF (congestive heart failure) ([place]) (07/16/2021), COPD (chronic obstructive pulmonary disease) ([place]), DDD (degenerative disc disease), Diabetic retinopathy of both eyes ([place]), Essential hypertension, GERD (gastroesophageal reflux disease), History of atrial flutter (02/2011), History of chronic bronchitis, History of hemolytic anemia (02/2011), HOH (hard of hearing), HOH (hard of hearing), Mitral valve prolapse, PAD (peripheral artery disease) ([place]) (04/2014), PAF (paroxysmal atrial fibrillation) ([place]), Perforated ulcer ([place]), Productive cough, Smokers' cough ([place]), and Type 1 diabetes mellitus ([place]) (1977).   They are presenting to PM&R clinic for follow up related to low back pain.   Plan from last visit: Chronic Bilateral Low Back Pain without Sciatica:  Has been evaluated by spinal surgery Dr. Lorin Mercy 10/5, who has imaging showing R neuroforaminal stenosis, but would not be a candidate for decompression and fusion unless he quits smoking, which patient is currently unwilling to do.     2. ETOH Abuse: Mr. Rende states he's not drinking any alcohol. Oral  Swab was Performed today.   Offered multiple times to refer patient to addiction management to assist in weaning off alcohol; patient insists he has no side effects prior with stopping, and prefers to quit without outside assistance. He is agreeable to abstaining from alcohol as part of continuation of current controlled substance contract.    3. Chronic Pain Syndrome: Refilled: Hydrocodone 5/325 mg one tablet every 8 hours as needed for pain #90.     Interval Hx:  - Follow ups: Saw Dr. Letta Pate, recommended MBB Bilateral L3-4-5 MBB; was scheduled for 2/15 but patient cancelled because he is concerned that blocking the nerves will make it so he does not feel anything if he injures his back, and may hurt himself worse by continuing to damage it. He re-iterates this concern multiple times.    - No falls or re-hospitalizations since last visit. He did have pneumonia and is getting treatment for that. 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    - Medications: Hydrocodone 5/325 mg one tablet every 8 hours as needed for pain #90.  Last filled 03/28/22. Pain stable on this regimen, allows him to continue working.   - Other concerns: Passed oral swab list visit - ETOH negative. He endorses he is no longer drinking alcohol.    ROS: Review of Systems  Musculoskeletal:  Positive for back pain.  All other systems reviewed and are negative.  PE: [place]: Appropriate appearance for age. No apparent distress   Resp: CTAB, reduced air movement. No rales, rhonchi, or wheezing. + on oxygen concentrator with [place],  Cardio: RRR. No mumurs, rubs, or gallops. No peripheral edema. Abdomen: Nondistended. Nontender. +bowel sounds. Psych: Appropriate mood and affect Neuro: AAOx4. No apparent deficits. 5/5 strength in bilateral UE and LE in all myotomes.    Back Exam:  No TTP bilateral lumbar paraspinals, bilateral PSIS, or bilateral SI  joints.   Facet loading: + Mildly increased LBP, bilateral   TTP at paraspinals: none   Forward bending: + Mildly increased bilateral LBP   Gait: Forward leaning, antalgic, no AD.     Assessment and Plan: Thomas Sandoval. is a 67 y.o. year old male  who  has a past medical history of Arthritis, Bladder cancer ([place]), Chronic back pain, Chronic combined systolic and diastolic CHF (congestive heart failure) ([place]) (07/16/2021), COPD (chronic obstructive pulmonary disease) ([place]), DDD (degenerative disc disease), Diabetic retinopathy of both eyes ([place]), Essential hypertension, GERD (gastroesophageal reflux disease), History of atrial flutter (02/2011), History of chronic bronchitis, History of hemolytic anemia (02/2011), HOH (hard of hearing), HOH (hard of hearing), Mitral valve prolapse, PAD (peripheral artery disease) ([place]) (04/2014), PAF (paroxysmal atrial fibrillation) ([place]), Perforated ulcer ([place]), Productive cough, Smokers' cough ([place]), and Type 1 diabetes mellitus ([place]) (1977).   They are presenting to PM&R clinic for follow up related to low back pain.   Chronic pain syndrome Assessment & Plan: Indication for chronic opioid: M54.9 Medication and dose: Norco 5-325 mg TID PRN # pills per month: 90 Last UDS date: 02/10/22 Opioid Treatment Agreement signed (Y/N): Y Opioid Treatment Agreement last reviewed with patient:  11/17/21 NCCSRS reviewed this encounter (include red flags):  Y   Medium Risk (10-90 MME) UDS every 3-6 months NCCSR check every visit Q3M follow up   Spondylosis without myelopathy or radiculopathy, lumbar region  ETOH abuse Assessment & Plan: Patient is doing well abstaining from alcohol on medication. No w/d. Drug screen last visit negative for ETOH.    Admission for long-term opiate analgesic use  Chronic bilateral low back pain, unspecified whether sciatica present Assessment & Plan: I will prescribe you another 3 months of Norco 5 mg 1 tablet every 8 hours.  You can use a lidocaine patch for your rib pain over the counter if needed.   I recommend you follow up with Dr. Letta Pate for SI joint ablation  Follow up in 3 months. Call clinic if any questions.   COPD with chronic bronchitis and emphysema ([place]) Assessment & Plan: Showed patient acapella device to help mobilize secretions with cough, to purchase OTC   Other orders -     HYDROcodone-Acetaminophen; Take 1 tablet by mouth 3 (three) times daily as needed for severe pain.  Dispense: 90 tablet; Refill: 0 -     HYDROcodone-Acetaminophen; Take 1 tablet by mouth every 8 (eight) hours as needed for moderate pain.  Dispense: 90 tablet; Refill: 0 -     HYDROcodone-Acetaminophen; Take 1 tablet by mouth every 8 (eight) hours as needed for moderate pain.  Dispense: 90 tablet; Refill: 0

## 2022-04-20 ENCOUNTER — Encounter: Payer: Self-pay | Admitting: Physical Medicine and Rehabilitation

## 2022-04-20 ENCOUNTER — Encounter
Payer: BC Managed Care – PPO | Attending: Physical Medicine and Rehabilitation | Admitting: Physical Medicine and Rehabilitation

## 2022-04-20 VITALS — BP 154/63 | HR 60 | Ht 69.0 in | Wt 185.0 lb

## 2022-04-20 DIAGNOSIS — Z79891 Long term (current) use of opiate analgesic: Secondary | ICD-10-CM | POA: Diagnosis not present

## 2022-04-20 DIAGNOSIS — J439 Emphysema, unspecified: Secondary | ICD-10-CM | POA: Diagnosis not present

## 2022-04-20 DIAGNOSIS — M47816 Spondylosis without myelopathy or radiculopathy, lumbar region: Secondary | ICD-10-CM

## 2022-04-20 DIAGNOSIS — F1010 Alcohol abuse, uncomplicated: Secondary | ICD-10-CM | POA: Diagnosis not present

## 2022-04-20 DIAGNOSIS — G8929 Other chronic pain: Secondary | ICD-10-CM | POA: Insufficient documentation

## 2022-04-20 DIAGNOSIS — J4489 Other specified chronic obstructive pulmonary disease: Secondary | ICD-10-CM | POA: Diagnosis not present

## 2022-04-20 DIAGNOSIS — M545 Low back pain, unspecified: Secondary | ICD-10-CM | POA: Diagnosis not present

## 2022-04-20 DIAGNOSIS — G894 Chronic pain syndrome: Secondary | ICD-10-CM | POA: Diagnosis not present

## 2022-04-20 DIAGNOSIS — Z5181 Encounter for therapeutic drug level monitoring: Secondary | ICD-10-CM | POA: Insufficient documentation

## 2022-04-20 DIAGNOSIS — J9611 Chronic respiratory failure with hypoxia: Secondary | ICD-10-CM

## 2022-04-20 NOTE — Patient Instructions (Addendum)
I will prescribe you another 3 months of Norco 5 mg 1 tablet every 8 hours. You can use a lidocaine patch for your rib pain over the counter if needed.   I recommend you follow up with Dr. Letta Pate for SI joint ablation  Look into getting an Acapella device for coughing, secretion management.  Follow up in 3 months. Call clinic if any questions.

## 2022-04-21 DIAGNOSIS — J9601 Acute respiratory failure with hypoxia: Secondary | ICD-10-CM | POA: Diagnosis not present

## 2022-04-22 ENCOUNTER — Telehealth: Payer: Self-pay | Admitting: *Deleted

## 2022-04-22 NOTE — Telephone Encounter (Signed)
Patient overdue for repeat labs (BMET) Ordered by Marlene Lard PA.  Spoke to patient, aware to get labs repeated.  Patient verbalized understanding.

## 2022-04-23 ENCOUNTER — Ambulatory Visit: Payer: BC Managed Care – PPO | Admitting: Physical Medicine & Rehabilitation

## 2022-04-24 MED ORDER — HYDROCODONE-ACETAMINOPHEN 5-325 MG PO TABS: 1.0000 | ORAL_TABLET | Freq: Three times a day (TID) | ORAL | 0 refills | Status: DC | PRN

## 2022-04-24 NOTE — Assessment & Plan Note (Signed)
I will prescribe you another 3 months of Norco 5 mg 1 tablet every 8 hours. You can use a lidocaine patch for your rib pain over the counter if needed.   I recommend you follow up with Dr. Letta Pate for SI joint ablation  Follow up in 3 months. Call clinic if any questions.

## 2022-04-24 NOTE — Assessment & Plan Note (Signed)
Indication for chronic opioid: M54.9 Medication and dose: Norco 5-325 mg TID PRN # pills per month: 90 Last UDS date: 02/10/22 Opioid Treatment Agreement signed (Y/N): Y Opioid Treatment Agreement last reviewed with patient:  11/17/21 NCCSRS reviewed this encounter (include red flags):  Y   Medium Risk (10-90 MME) UDS every 3-6 months NCCSR check every visit Q3M follow up

## 2022-04-24 NOTE — Assessment & Plan Note (Signed)
Patient is doing well abstaining from alcohol on medication. No w/d. Drug screen last visit negative for ETOH.

## 2022-04-24 NOTE — Assessment & Plan Note (Signed)
Showed patient acapella device to help mobilize secretions with cough, to purchase OTC

## 2022-04-25 DIAGNOSIS — E161 Other hypoglycemia: Secondary | ICD-10-CM | POA: Diagnosis not present

## 2022-04-27 ENCOUNTER — Telehealth: Payer: Self-pay | Admitting: Emergency Medicine

## 2022-04-27 MED ORDER — DOXYCYCLINE HYCLATE 100 MG PO TABS: 100.0000 mg | ORAL_TABLET | Freq: Two times a day (BID) | ORAL | 0 refills | Status: DC

## 2022-04-27 MED ORDER — PREDNISONE 10 MG PO TABS: ORAL_TABLET | ORAL | 0 refills | Status: DC

## 2022-04-27 NOTE — Telephone Encounter (Signed)
Called and spoke with patient. He stated that he is now down to 59m of prednisone once daily for his cough and the cough is starting to rev up again. The cough is still productive with brown phlegm. He denied any increased SOB but he has been wheezing more. His grandchildren were sick with colds last week and he believes he has picked up their cold.   He denied any fevers or body aches. The last abx he took was the Ceftin. He said the cough responded much better to the 464mof prednisone but he knows this dosage is not good for his diabetes.   He wanted to know if there was another abx that RB would prescribe. I reminded him that [place] mentioned on 2/9 that there was not a better abx than the Ceftin. He wanted to know if RB would send in something else for him.   [facility] [place]  RB, can you please advise? Thanks!

## 2022-04-27 NOTE — Telephone Encounter (Signed)
If he has an upper respiratory infection likely needs to be treated for another flare.  Please give him a prednisone taper as below, back down to his usual 20 mg.  Stay at that dose until he can follow-up in office.  Also given doxycycline as below  Prednisone > Take 52m daily for 3 days, then 366mdaily for 3 days, then 2037maily for 3 days, then 73m27mily for 3 days, then stop  Doxycycline 100 mg twice daily for 7 days

## 2022-04-27 NOTE — Telephone Encounter (Signed)
Called and spoke with patient. He verbalized understanding. Medications have been sent to his pharmacy.

## 2022-04-27 NOTE — Telephone Encounter (Signed)
Called patient twice. Someone answered the first call but did not say anything. I called him again and the call went to VM. Will attempt to call back later.

## 2022-05-01 ENCOUNTER — Telehealth: Payer: Self-pay | Admitting: Emergency Medicine

## 2022-05-01 NOTE — Telephone Encounter (Signed)
Patient called answering service. He is not feeling better. Has congested cough and is short winded. Currently on prednisone and doxycycline without improved. Advised he should go to ED or UC for chest imaging and further evaluation. Patient/family member agreeing.

## 2022-05-01 NOTE — Telephone Encounter (Signed)
ATC X1 LVM for patient to call the office back

## 2022-05-03 NOTE — Progress Notes (Deleted)
Cardiology Office Note:    Date:  05/03/2022   ID:  Thomas Medici., DOB 10/26/1955, MRN [id number]  PCP:  Chevis Pretty, FNP  Cardiologist:  Donato Heinz, MD  Electrophysiologist:  Vickie Epley, MD   Referring MD: Hassell Done, [name], *   No chief complaint on file.   History of Present Illness:    Thomas Mynatt. is a 67 y.o. male with a hx of alcohol abuse, tobacco abuse, COPD, perforated gastric ulcer status postrepair in 2016, T2DM, hypertension, PAD, MVP, atrial fibrillation/flutter, systolic heart failure who presents for follow-up.  He was hospitalized September 2022 with acute pancreatitis found to be in A. fib.  Echo at that time showed EF 30 to 35%.  He left AMA.  At follow-up with his PCP 12/17/2020 he was still in A. fib and started on Xarelto and diltiazem.  He was referred to A. fib clinic, seen on 12/25/2020.  He underwent cardioversion on 01/20/2021 with successful restoration of sinus rhythm.  Echocardiogram 08/26/2017 showed EF 55 to 60%, mild mitral valve prolapse.  Echo 12/05/2020 showed EF 30 to 35%, global hypokinesis, and low normal RV function, moderate left atrial enlargement, trivial MR.  Zio patch x3 days on 02/10/2021 showed high percent A. fib burden with average rate 95 bpm.  Echocardiogram 05/21/2021 showed EF 55 to 123456, grade 2 diastolic dysfunction, normal RV function, no significant valvular disease.  He was admitted 11/2021 after being found unresponsive with significant hypoglycemia and hypotension.  EKG with aVR elevation and diffuse ST depressions, suggesting global ischemia.  EKG normalized with correction of his hypoglycemia.  He was seen by cardiology and outpatient coronary CT was recommended.  Coronary CTA was attempted but he was in A-fib on presentation and CT was canceled.  Since last clinic visit,  ***BMET, PET  he reports recently started on treatment for COPD exacerbation with prednisone and doxycycline.   Taking Lasix 40 mg daily.  Reports stable lower extremity edema.  Denies any chest pain.  Wt Readings from Last 3 Encounters:  04/20/22 185 lb (83.9 kg)  03/26/22 192 lb 3.2 oz (87.2 kg)  03/12/22 196 lb (88.9 kg)    Past Medical History:  Diagnosis Date   Arthritis    Bladder cancer ([place])    Chronic back pain    Chronic combined systolic and diastolic CHF (congestive heart failure) ([place]) 07/16/2021   COPD (chronic obstructive pulmonary disease) ([facility])    DDD (degenerative disc disease)    Diabetic retinopathy of both eyes ([place])    Essential hypertension    GERD (gastroesophageal reflux disease)    History of atrial flutter 02/07/2011   Converted to NSR with Cardizem   History of chronic bronchitis    History of hemolytic anemia 02/07/2011   secondary to Avelox   HOH (hard of hearing)    HOH (hard of hearing)    no eardrum and nerve damage on R, also HOH on L   Mild renal insufficiency 08/25/2017   Mitral valve prolapse    a. 2D Echo 11/27/14: EF 55-60%; images were inadequate for LV wall motion assessment, + mild late systolic mitral valve prolapse involving the anterior leaflet.   PAD (peripheral artery disease) ([place]) 04/09/2014   Dr Trula Slade; bilateral SFA occlusion, R mid, L distal   PAF (paroxysmal atrial fibrillation) ([place])    a. Dx 11/2014 during admission for perf ulcer.   Perforated ulcer ([place])    a. 11/2014 s/p surgery.   Productive cough  Smokers' cough ([place])    Type 1 diabetes mellitus ([place]) 03/10/1975    Past Surgical History:  Procedure Laterality Date   CARDIOVERSION N/A 01/20/2021   Procedure: CARDIOVERSION;  Surgeon: Fay Records, MD;  Location: [facility];  Service: Cardiovascular;  Laterality: N/A;   CYSTOSCOPY WITH URETEROSCOPY Right 08/14/2013   Procedure: CYSTOSCOPY WITH URETEROSCOPY BLADDER BIOPSY ;  Surgeon: Claybon Jabs, MD;  Location: [facility];  Service: Urology;  Laterality: Right;   ESOPHAGOGASTRODUODENOSCOPY N/A 02/06/2013    Procedure: ESOPHAGOGASTRODUODENOSCOPY (EGD);  Surgeon: Lafayette Dragon, MD;  Location: [facility] ENDOSCOPY;  Service: Endoscopy;  Laterality: N/A;   LAPAROSCOPY N/A 11/25/2014   Procedure: LAPAROSCOPIC PRIMARY REPAIR OF PERFORATED PREPYLORIC ULCER WITH Silvestre Gunner;  Surgeon: Greer Pickerel, MD;  Location: [facility];  Service: General;  Laterality: N/A;   TONSILLECTOMY  as child   TRANSTHORACIC ECHOCARDIOGRAM  02-17-2011   MODERATE LVH/  EF 65%   TRANSURETHRAL RESECTION OF BLADDER TUMOR WITH GYRUS (TURBT-GYRUS) N/A 06/12/2013   Procedure: TRANSURETHRAL RESECTION OF BLADDER TUMOR WITH GYRUS (TURBT-GYRUS);  Surgeon: Claybon Jabs, MD;  Location: [facility];  Service: Urology;  Laterality: N/A;   TYMPANIC MEMBRANE REPAIR  as child    Current Medications: No outpatient medications have been marked as taking for the 05/04/22 encounter (Appointment) with Donato Heinz, MD.     Allergies:   Avelox [moxifloxacin hcl in nacl], Azithromycin, and Bactrim [sulfamethoxazole-trimethoprim]   Social History   Socioeconomic History   Marital status: Married    Spouse name: Not on file   Number of children: Not on file   Years of education: Not on file   Highest education level: Not on file  Occupational History   Occupation: Tobacco Farmer  Tobacco Use   Smoking status: Every Day    Packs/day: 1.00    Years: 30.00    Total pack years: 30.00    Types: Cigarettes   Smokeless tobacco: Former    Quit date: 06/08/1978   Tobacco comments:    1  pack of cigarettes smoked daily ARJ 02/27/22  Vaping Use   Vaping Use: Never used  Substance and Sexual Activity   Alcohol use: Yes    Comment: 5 quarts per week 12/26/21   Drug use: No   Sexual activity: Not on file  Other Topics Concern   Not on file  Social History Narrative   Lives in [place] with wife and 2 sons.    Social Determinants of Health   Financial Resource Strain: Not on file  Food Insecurity: Unknown (11/27/2021)   Hunger  Vital Sign    Worried About Running Out of Food in the Last Year: Patient refused    Ran Out of Food in the Last Year: Patient refused  Transportation Needs: Unknown (11/27/2021)   PRAPARE - Hydrologist (Medical): Patient refused    Lack of Transportation (Non-Medical): Patient refused  Physical Activity: Not on file  Stress: Not on file  Social Connections: Not on file     Family History: The patient's family history includes Breast cancer in his mother; Cancer in his mother; Diabetes in his son; Heart attack in his father; Heart disease in his father; Rheumatic fever in his father.  ROS:   Please see the history of present illness.     All other systems reviewed and are negative.  EKGs/Labs/Other Studies Reviewed:    The following studies were reviewed today:   EKG:  02/04/22: Atrial fibrillation, rate 104 11/19/2021: Normal sinus rhythm, rate 72, left axis deviation 08/20/2021: Atrial flutter with variable conduction, rate 87 05/20/21: Normal sinus rhythm, rate 61, left axis deviation, nonspecific T wave flattening, Q waves in leads III, aVF 03/14/21:NSR, ST depression<1 mm in inferior leads and V4-6, rate 61  Recent Labs: 08/17/2021: TSH 0.322 02/02/2022: ALT 14; Hemoglobin 15.5; Platelets 329 02/11/2022: B Natriuretic Peptide 886.2 03/26/2022: BUN 30; Creatinine, Ser 2.03; Magnesium 2.0; Potassium 3.3; Sodium 133  Recent Lipid Panel    Component Value Date/Time   CHOL 104 02/02/2022 1426   TRIG 98 02/02/2022 1426   HDL 48 02/02/2022 1426   CHOLHDL 2.2 02/02/2022 1426   LDLCALC 37 02/02/2022 1426    Physical Exam:    VS:  There were no vitals taken for this visit.    Wt Readings from Last 3 Encounters:  04/20/22 185 lb (83.9 kg)  03/26/22 192 lb 3.2 oz (87.2 kg)  03/12/22 196 lb (88.9 kg)     GEN:  Well nourished, well developed in no acute distress HEENT: Normal NECK: No JVD; No carotid bruits CARDIAC: RRR, no murmurs, rubs,  gallops RESPIRATORY:  Clear to auscultation without rales, wheezing or rhonchi  ABDOMEN: Soft, non-tender, non-distended MUSCULOSKELETAL: 1+ edema; No deformity  SKIN: Warm and dry NEUROLOGIC:  Alert and oriented x 3 PSYCHIATRIC:  Normal affect   ASSESSMENT:    No diagnosis found.    PLAN:    Persistent atrial fibrillation/flutter: CHA2DS2-VASc score 4.  Underwent successful cardioversion on 01/20/2021, but was back in A. fib at follow-up appointment on 01/24/2021.  Zio patch x3 days on 02/10/2021 showed 100% percent A. fib burden with average rate 95 bpm.  Subsequently converted to sinus rhythm, but was in a flutter at clinic appointment 08/20/2021.  Seen by A-fib clinic and was loaded with Tikosyn, now maintaining sinus rhythm -Continue Tikosyn 250 mcg twice daily -Continue Xarelto 20 mg daily -Continue Toprol-XL 100 mg daily  Chronic combined systolic and diastolic heart failure: EF 30 to 35% on echocardiogram during admission with acute pancreatitis in September 2022.  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  Echocardiogram 05/21/2021 showed EF 55 to 60% -Continue Lasix 20 mg daily.   -Continue Toprol-XL 100 mg daily -Continue losartan 50 mg daily -Continue Jardiance 10 mg daily  AKI: Labs on 03/26/2022 showed worsening renal function (1.48 > 2.03).  Lasix dose was decreased to 20 mg daily.  Check BMET  Abnormal EKG: He was admitted 11/2021 after being found unresponsive with significant hypoglycemia and hypotension.  EKG with aVR elevation and diffuse ST depressions, suggesting global ischemia.  EKG normalized with correction of his hypoglycemia.  He was seen by cardiology and outpatient coronary CT was recommended.  He denies any chest pain but having dyspnea on exertion -Recommend coronary CTA to evaluate for obstructive CAD.  Coronary CTA was attempted but he was in A-fib on presentation and was canceled.  Can reattempt now that back in sinus rhythm***  Alcohol abuse: Counseled  risk of alcohol abuse and cessation recommended.  He initially stopped drinking after admission in September 2022, but reports he has been drinking recently.  Cessation recommended.  Tobacco use: Patient counseled on the risk of tobacco use and cessation strongly encouraged.  He reports no interest in quitting smoking  Hypertension: Continue Toprol-XL, losartan  T2DM: On insulin  PAD: ABIs 2019 are severely reduced (right 0.52, left 0.48).  Lower extremity duplex 04/2014 showed bilateral SFA occlusions.  ABIs 02/2022 were 0.36 on right, 0.45  on left.  Follows with vascular surgery   RTC in***   Medication Adjustments/Labs and Tests Ordered: Current medicines are reviewed at length with the patient today.  Concerns regarding medicines are outlined above.  No orders of the defined types were placed in this encounter.   No orders of the defined types were placed in this encounter.   There are no Patient Instructions on file for this visit.   Signed, Donato Heinz, MD  05/03/2022 1:34 PM    [facility]

## 2022-05-04 ENCOUNTER — Ambulatory Visit: Payer: BC Managed Care – PPO | Admitting: Cardiology

## 2022-05-04 NOTE — Telephone Encounter (Signed)
Called and left voicemail that he needs to call clinic back after he is seen at [facility] or the ED for follow up visit with RB or an app. Nothing further needed

## 2022-05-04 NOTE — Telephone Encounter (Signed)
He will need quick follow up by RB or APP after he is seen in UC. He is having persistent sx despite standard rx for flare.

## 2022-05-06 NOTE — Telephone Encounter (Signed)
Patient is calling back to get the results of his test results.  Please call patient asap.  CB# [phone]

## 2022-05-07 ENCOUNTER — Telehealth: Payer: Self-pay | Admitting: Nurse Practitioner

## 2022-05-07 NOTE — Telephone Encounter (Signed)
NEEDS TO GO TO THE ed AS SUGGESTED

## 2022-05-07 NOTE — Telephone Encounter (Signed)
Patient had CT from pulmonology office and they diagnosed him with pneumonia and started him on Doxycycline and prednisone. He has taken almost all the medication and he contacted their office and told them he was not better and they recommended ER or urgent care. He wants to know if there is anything you can do for him? Can he do a video visit?

## 2022-05-08 NOTE — Telephone Encounter (Signed)
Per DPR, patient notified via voicemail that MMM also recommends he go to the ER.

## 2022-05-12 ENCOUNTER — Telehealth: Payer: Self-pay

## 2022-05-12 ENCOUNTER — Other Ambulatory Visit: Payer: Self-pay | Admitting: Nurse Practitioner

## 2022-05-12 DIAGNOSIS — J449 Chronic obstructive pulmonary disease, unspecified: Secondary | ICD-10-CM

## 2022-05-12 NOTE — Telephone Encounter (Signed)
The following question arose when calling pt with CT results:    Priscille Kluver, [facility] 05/04/2022 10:43 AM EST Back to Top    Called and spoke w/ pt, Dr.Byrum pt is asking if he has nodules in both lungs per the radiologist impressions. Please advise   Collene Gobble, MD 04/17/2022  4:59 PM EST     Please inform patient that CT chest shows that pulmonary nodule is stable in size.  There is an inflammatory focus at the bottom of the right lung that is suggestive of pneumonia (which we have treated with antibiotics).  I would like for him to have a repeat CT chest in 3 months to ensure that this area is clearing and that the nodule is stable.    Dr. Lamonte Sakai please advise.

## 2022-05-12 NOTE — Telephone Encounter (Signed)
He still has nodules on both sides.

## 2022-05-13 ENCOUNTER — Telehealth: Payer: Self-pay | Admitting: Emergency Medicine

## 2022-05-13 NOTE — Telephone Encounter (Signed)
ATC X1 LVM for patient to call the office back

## 2022-05-13 NOTE — Telephone Encounter (Signed)
I agree that he needs to be evaluated in ED. Thank you

## 2022-05-13 NOTE — Telephone Encounter (Signed)
PT states he is still very SOB. Can barely take off O2 to shave. States in the past the pneumonia does not show up on the Xrays. Was told by [place], he said, that if he does not get better to go to the hospital.   I did read the notes to him from the last, signed, tel encounter so please review before you call back. After reading the notes he started to tell me the above symptoms.  Pls call to advise @ [phone]

## 2022-05-13 NOTE — Telephone Encounter (Signed)
Called pt and informed him of Dr.Byrum's message. He verbalized understanding, NFN att.

## 2022-05-13 NOTE — Telephone Encounter (Signed)
Called and spoke w/ pt he states that he is feeling no better and that his PCP told him to go to the ED because of the increased SOB he is experiencing. I offered him an appt with a NP but he states that he thinks he should go to the ED.   Forwarding to RB as an [place]

## 2022-05-13 NOTE — Telephone Encounter (Signed)
Please refer to encounter from 3/5.

## 2022-05-19 ENCOUNTER — Other Ambulatory Visit: Payer: Self-pay | Admitting: Family Medicine

## 2022-05-19 DIAGNOSIS — J189 Pneumonia, unspecified organism: Secondary | ICD-10-CM

## 2022-05-19 DIAGNOSIS — R946 Abnormal results of thyroid function studies: Secondary | ICD-10-CM

## 2022-05-19 DIAGNOSIS — J449 Chronic obstructive pulmonary disease, unspecified: Secondary | ICD-10-CM

## 2022-05-20 DIAGNOSIS — J9601 Acute respiratory failure with hypoxia: Secondary | ICD-10-CM | POA: Diagnosis not present

## 2022-05-27 ENCOUNTER — Other Ambulatory Visit: Payer: Self-pay | Admitting: Nurse Practitioner

## 2022-05-27 DIAGNOSIS — E10649 Type 1 diabetes mellitus with hypoglycemia without coma: Secondary | ICD-10-CM

## 2022-06-03 ENCOUNTER — Telehealth: Payer: Self-pay | Admitting: Cardiology

## 2022-06-03 DIAGNOSIS — I5042 Chronic combined systolic (congestive) and diastolic (congestive) heart failure: Secondary | ICD-10-CM

## 2022-06-03 NOTE — Telephone Encounter (Signed)
*  STAT* If patient is at the pharmacy, call can be transferred to refill team.   1. Which medications need to be refilled? (please list name of each medication and dose if known)  metoprolol succinate (TOPROL-XL) 75 MG 24 hr tablet  2. Which pharmacy/location (including street and city if local pharmacy) is medication to be sent to? [id number] - [place], [place] [address]  3. Do they need a 30 day or 90 day supply?  90 day supply  Patient's wife is requesting the 75 MG tablet instead of 50 MG if possible.

## 2022-06-03 NOTE — Telephone Encounter (Signed)
This is a A-Fib clinic pt 

## 2022-06-04 MED ORDER — METOPROLOL SUCCINATE ER 50 MG PO TB24: 75.0000 mg | ORAL_TABLET | Freq: Two times a day (BID) | ORAL | 2 refills | Status: DC

## 2022-06-04 NOTE — Telephone Encounter (Signed)
75mg  tablet unavailable for toprol. Refill sent.

## 2022-06-20 DIAGNOSIS — J9601 Acute respiratory failure with hypoxia: Secondary | ICD-10-CM | POA: Diagnosis not present

## 2022-06-21 NOTE — Progress Notes (Deleted)
Cardiology Office Note:    Date:  02/04/2022   ID:  Thomas Sandoval., DOB 06-Aug-1955, MRN [id number]  PCP:  Bennie Pierini, FNP  Cardiologist:  Little Ishikawa, MD  Electrophysiologist:  Lanier Prude, MD   Referring MD: Daphine Deutscher, [name], *   No chief complaint on file.   History of Present Illness:    Thomas Sandoval. is a 67 y.o. male with a hx of alcohol abuse, tobacco abuse, COPD, perforated gastric ulcer status postrepair in 2016, T2DM, hypertension, PAD, MVP, atrial fibrillation/flutter, systolic heart failure who presents for follow-up.  He was hospitalized September 2022 with acute pancreatitis found to be in A. fib.  Echo at that time showed EF 30 to 35%.  He left AMA.  At follow-up with his PCP 12/17/2020 he was still in A. fib and started on Xarelto and diltiazem.  He was referred to A. fib clinic, seen on 12/25/2020.  He underwent cardioversion on 01/20/2021 with successful restoration of sinus rhythm.  Echocardiogram 08/26/2017 showed EF 55 to 60%, mild mitral valve prolapse.  Echo 12/05/2020 showed EF 30 to 35%, global hypokinesis, and low normal RV function, moderate left atrial enlargement, trivial MR.  Zio patch x3 days on 02/10/2021 showed high percent A. fib burden with average rate 95 bpm.  Echocardiogram 05/21/2021 showed EF 55 to 60%, grade 2 diastolic dysfunction, normal RV function, no significant valvular disease.  He was admitted 11/2021 after being found unresponsive with significant hypoglycemia and hypotension.  EKG with aVR elevation and diffuse ST depressions, suggesting global ischemia.  EKG normalized with correction of his hypoglycemia.  He was seen by cardiology and outpatient coronary CT was recommended.  Coronary CTA was attempted but he was in A-fib on presentation and CT was canceled.  He was admitted for Tikosyn loading 02/2022.  Converted to sinus rhythm on Tikosyn.  Since last clinic visit, he reports recently started on  treatment for COPD exacerbation with prednisone and doxycycline.  Taking Lasix 40 mg daily.  Reports stable lower extremity edema.  Denies any chest pain.  Wt Readings from Last 3 Encounters:  02/04/22 195 lb 9.6 oz (88.7 kg)  02/02/22 188 lb (85.3 kg)  01/28/22 185 lb 3.2 oz (84 kg)    Past Medical History:  Diagnosis Date   Arthritis    Bladder cancer (HCC)    Chronic back pain    Chronic combined systolic and diastolic CHF (congestive heart failure) (HCC) 07/16/2021   COPD (chronic obstructive pulmonary disease) (HCC)    DDD (degenerative disc disease)    Diabetic retinopathy of both eyes (HCC)    Essential hypertension    GERD (gastroesophageal reflux disease)    History of atrial flutter 02/2011   Converted to NSR with Cardizem   History of chronic bronchitis    History of hemolytic anemia 02/2011   secondary to Avelox   HOH (hard of hearing)    HOH (hard of hearing)    no eardrum and nerve damage on R, also HOH on L   Mitral valve prolapse    a. 2D Echo 11/27/14: EF 55-60%; images were inadequate for LV wall motion assessment, + mild late systolic mitral valve prolapse involving the anterior leaflet.   PAD (peripheral artery disease) (HCC) 04/2014   Dr Myra Gianotti; bilateral SFA occlusion, R mid, L distal   PAF (paroxysmal atrial fibrillation) (HCC)    a. Dx 11/2014 during admission for perf ulcer.   Perforated ulcer (HCC)    a. 11/2014  s/p surgery.   Productive cough    Smokers' cough (HCC)    Type 1 diabetes mellitus (HCC) 1977    Past Surgical History:  Procedure Laterality Date   CARDIOVERSION N/A 01/20/2021   Procedure: CARDIOVERSION;  Surgeon: Pricilla Riffle, MD;  Location: [facility] ENDOSCOPY;  Service: Cardiovascular;  Laterality: N/A;   CYSTOSCOPY WITH URETEROSCOPY Right 08/14/2013   Procedure: CYSTOSCOPY WITH URETEROSCOPY BLADDER BIOPSY ;  Surgeon: Garnett Farm, MD;  Location: [facility];  Service: Urology;  Laterality: Right;   ESOPHAGOGASTRODUODENOSCOPY  N/A 02/06/2013   Procedure: ESOPHAGOGASTRODUODENOSCOPY (EGD);  Surgeon: Hart Carwin, MD;  Location: [facility] ENDOSCOPY;  Service: Endoscopy;  Laterality: N/A;   LAPAROSCOPY N/A 11/25/2014   Procedure: LAPAROSCOPIC PRIMARY REPAIR OF PERFORATED PREPYLORIC ULCER WITH Peggye Ley;  Surgeon: Gaynelle Adu, MD;  Location: [facility] OR;  Service: General;  Laterality: N/A;   TONSILLECTOMY  as child   TRANSTHORACIC ECHOCARDIOGRAM  02-17-2011   MODERATE LVH/  EF 65%   TRANSURETHRAL RESECTION OF BLADDER TUMOR WITH GYRUS (TURBT-GYRUS) N/A 06/12/2013   Procedure: TRANSURETHRAL RESECTION OF BLADDER TUMOR WITH GYRUS (TURBT-GYRUS);  Surgeon: Garnett Farm, MD;  Location: [facility];  Service: Urology;  Laterality: N/A;   TYMPANIC MEMBRANE REPAIR  as child    Current Medications: Current Meds  Medication Sig   albuterol (VENTOLIN HFA) 108 (90 Base) MCG/ACT inhaler Inhale 2 puffs into the lungs every 6 (six) hours as needed for wheezing or shortness of breath.   cetirizine (ZYRTEC) 10 MG tablet Take 10 mg by mouth daily.   doxycycline (VIBRA-TABS) 100 MG tablet Take 1 tablet (100 mg total) by mouth 2 (two) times daily.   empagliflozin (JARDIANCE) 10 MG TABS tablet Take 1 tablet (10 mg total) by mouth daily before breakfast.   famotidine (PEPCID) 20 MG tablet Take 1 tablet (20 mg total) by mouth every morning. Reported on 04/16/2015   furosemide (LASIX) 40 MG tablet Take 1 tablet (40 mg total) by mouth daily.   HYDROcodone-acetaminophen (NORCO/VICODIN) 5-325 MG tablet Take 1 tablet by mouth 3 (three) times daily as needed for severe pain.   insulin aspart (NOVOLOG) 100 UNIT/ML injection Per sliding scale 190-200=2u. 300 and above 7u   insulin glargine (LANTUS) 100 UNIT/ML injection Inject 0.2 mLs (20 Units total) into the skin daily.   Insulin Syringe-Needle U-100 (B-D INS SYR ULTRAFINE 1CC/30G) 30G X 1/2" 1 ML MISC Use 4 times a day with insulin Dx E11.9   Insulin Syringes, Disposable, U-100 1 ML MISC Use 4  times a day for insulin injection Dx E11.9   ipratropium-albuterol (DUONEB) 0.5-2.5 (3) MG/3ML SOLN Take 3 mLs by nebulization every 4 (four) hours as needed.   levalbuterol (XOPENEX) 0.31 MG/3ML nebulizer solution Take 3 mLs (0.31 mg total) by nebulization every 4 (four) hours as needed for wheezing.   losartan (COZAAR) 50 MG tablet Take 1 tablet (50 mg total) by mouth at bedtime. hold this medication until follow up   metoprolol succinate (TOPROL-XL) 100 MG 24 hr tablet Take 1 tablet (100 mg total) by mouth daily. Take with or immediately following a meal.   naloxone (NARCAN) nasal spray 4 mg/0.1 mL Place 1 spray into the nose as needed (For opiate overdose).   OVER THE COUNTER MEDICATION Take 1 Scoop by mouth in the morning and at bedtime. Super Beets   predniSONE (DELTASONE) 10 MG tablet Take 4 tablets X 3 days, 3 tabs X 3 days, 2 tabs x 3 days, 1 tab x  3 days   rivaroxaban (XARELTO) 20 MG TABS tablet Take 1 tablet (20 mg total) by mouth daily with supper.   TRELEGY ELLIPTA 100-62.5-25 MCG/ACT AEPB Inhale 1 puff into the lungs daily.     Allergies:   Azithromycin, Avelox [moxifloxacin hcl in nacl], Bactrim [sulfamethoxazole-trimethoprim], Moxifloxacin, and Sulfamethoxazole-trimethoprim   Social History   Socioeconomic History   Marital status: Married    Spouse name: Not on file   Number of children: Not on file   Years of education: Not on file   Highest education level: Not on file  Occupational History   Occupation: Tobacco Farmer  Tobacco Use   Smoking status: Every Day    Packs/day: 2.00    Years: 30.00    Total pack years: 60.00    Types: Cigarettes   Smokeless tobacco: Former    Quit date: 06/08/1978   Tobacco comments:    3/4 of a pack of cigarettes smoked daily [facility] 01/28/22  Vaping Use   Vaping Use: Never used  Substance and Sexual Activity   Alcohol use: Yes    Comment: 5 quarts per week 12/26/21   Drug use: No   Sexual activity: Not on file  Other Topics Concern    Not on file  Social History Narrative   Lives in [place] with wife and 2 sons.    Social Determinants of Health   Financial Resource Strain: Not on file  Food Insecurity: Unknown (11/27/2021)   Hunger Vital Sign    Worried About Running Out of Food in the Last Year: Patient refused    Ran Out of Food in the Last Year: Patient refused  Transportation Needs: Unknown (11/27/2021)   PRAPARE - Administrator, Civil Service (Medical): Patient refused    Lack of Transportation (Non-Medical): Patient refused  Physical Activity: Not on file  Stress: Not on file  Social Connections: Not on file     Family History: The patient's family history includes Breast cancer in his mother; Cancer in his mother; Diabetes in his son; Heart attack in his father; Heart disease in his father; Rheumatic fever in his father.  ROS:   Please see the history of present illness.     All other systems reviewed and are negative.  EKGs/Labs/Other Studies Reviewed:    The following studies were reviewed today:   EKG:   02/04/22: Atrial fibrillation, rate 104 11/19/2021: Normal sinus rhythm, rate 72, left axis deviation 08/20/2021: Atrial flutter with variable conduction, rate 87 05/20/21: Normal sinus rhythm, rate 61, left axis deviation, nonspecific T wave flattening, Q waves in leads III, aVF 03/14/21:NSR, ST depression<1 mm in inferior leads and V4-6, rate 61  Recent Labs: 07/16/2021: B Natriuretic Peptide 950.1 08/17/2021: TSH 0.322 11/27/2021: Magnesium 1.9 02/02/2022: ALT 14; BUN 22; Creatinine, Ser 1.29; Hemoglobin 15.5; Platelets 329; Potassium 4.0; Sodium 138  Recent Lipid Panel    Component Value Date/Time   CHOL 104 02/02/2022 1426   TRIG 98 02/02/2022 1426   HDL 48 02/02/2022 1426   CHOLHDL 2.2 02/02/2022 1426   LDLCALC 37 02/02/2022 1426    Physical Exam:    VS:  BP 115/60   Pulse (!) 104   Ht 5\' 9"  (1.753 m)   Wt 195 lb 9.6 oz (88.7 kg)   SpO2 98%   BMI 28.89 kg/m      Wt Readings from Last 3 Encounters:  02/04/22 195 lb 9.6 oz (88.7 kg)  02/02/22 188 lb (85.3 kg)  01/28/22 185 lb 3.2  oz (84 kg)     GEN:  Well nourished, well developed in no acute distress HEENT: Normal NECK: No JVD; No carotid bruits CARDIAC: RRR, no murmurs, rubs, gallops RESPIRATORY:  Clear to auscultation without rales, wheezing or rhonchi  ABDOMEN: Soft, non-tender, non-distended MUSCULOSKELETAL: 1+ edema; No deformity  SKIN: Warm and dry NEUROLOGIC:  Alert and oriented x 3 PSYCHIATRIC:  Normal affect   ASSESSMENT:    1. Persistent atrial fibrillation (HCC)   2. Chronic combined systolic and diastolic CHF (congestive heart failure) (HCC)   3. Tobacco use   4. PAD (peripheral artery disease) (HCC)      PLAN:    Persistent atrial fibrillation/flutter: CHA2DS2-VASc score 4.  Underwent successful cardioversion on 01/20/2021, but was back in A. fib at follow-up appointment on 01/24/2021.  Zio patch x3 days on 02/10/2021 showed 100% percent A. fib burden with average rate 95 bpm.  Subsequently converted to sinus rhythm, but was in a flutter at clinic appointment 08/20/2021.  He is now back in Afib -Continue Xarelto 20 mg daily -Continue Toprol-XL 100 mg daily -Given possible tachycardia induced cardiomyopathy, would recommend rhythm control strategy.  Referred to EP to consider ablation versus antiarrhythmic.  Seen by Dr Lalla Brothers.  Admitted 02/2022 for Tikosyn loading.  Continue Tikosyn 250 mcg twice daily.  QTc***, maintaining sinus rhythm  Chronic combined systolic and diastolic heart failure: EF 30 to 35% on echocardiogram during admission with acute pancreatitis in September 2022.  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  Echocardiogram 05/21/2021 showed EF 55 to 60% -Continue Lasix 40 mg daily.   -Continue Toprol-XL 100 mg daily -Continue losartan 50 mg daily -Continue Jardiance 10 mg daily  Abnormal EKG: He was admitted 11/2021 after being found unresponsive with  significant hypoglycemia and hypotension.  EKG with aVR elevation and diffuse ST depressions, suggesting global ischemia.  EKG normalized with correction of his hypoglycemia.  He was seen by cardiology and outpatient coronary CT was recommended.  He denies any chest pain but having dyspnea on exertion -Recommend coronary CTA to evaluate for obstructive CAD.  Coronary CTA was attempted but he was in A-fib on presentation and was canceled.  Has had CT chest however which shows severe coronary calcifications.  Given severe coronary calcifications, would recommend stress PET instead of coronary CTA***  Alcohol abuse: Counseled risk of alcohol abuse and cessation recommended.  He initially stopped drinking after admission in September 2022, but reports he has been drinking recently.  Cessation recommended.  Tobacco use: Patient counseled on the risk of tobacco use and cessation strongly encouraged  Hypertension: Continue Toprol-XL, losartan  T2DM: On insulin  PAD: ABIs 2019 are severely reduced (right 0.52, left 0.48).  Lower extremity duplex 04/2014 showed bilateral SFA occlusions.  ABIs 02/2022 were 0.36 on right, 0.45 on left.  Follows with vascular surgery   RTC in***   Medication Adjustments/Labs and Tests Ordered: Current medicines are reviewed at length with the patient today.  Concerns regarding medicines are outlined above.  Orders Placed This Encounter  Procedures   EKG 12-Lead    No orders of the defined types were placed in this encounter.   Patient Instructions  Medication Instructions:  Your physician recommends that you continue on your current medications as directed. Please refer to the Current Medication list given to you today.  *If you need a refill on your cardiac medications before your next appointment, please call your pharmacy*  Follow-Up: At [facility], you and your health needs are our priority.  As  part of our continuing mission to provide you with  exceptional heart care, we have created designated Provider Care Teams.  These Care Teams include your primary Cardiologist (physician) and Advanced Practice Providers (APPs -  Physician Assistants and Nurse Practitioners) who all work together to provide you with the care you need, when you need it.  We recommend signing up for the patient portal called "MyChart".  Sign up information is provided on this After Visit Summary.  MyChart is used to connect with patients for Virtual Visits (Telemedicine).  Patients are able to view lab/test results, encounter notes, upcoming appointments, etc.  Non-urgent messages can be sent to your provider as well.   To learn more about what you can do with MyChart, go to [email or web address].    Your next appointment:   AFIB clinic in next week (this week or next) 6 weeks with Dr. Bjorn Pippin        Signed, Little Ishikawa, MD  02/04/2022 11:15 AM    [facility]

## 2022-06-22 ENCOUNTER — Ambulatory Visit: Payer: BC Managed Care – PPO | Admitting: Cardiology

## 2022-06-23 NOTE — Progress Notes (Unsigned)
Cardiology Office Note:    Date:  06/24/2022   ID:  Thomas Sandoval., DOB 1956/02/18, MRN [id number]  PCP:  Bennie Pierini, FNP  Cardiologist:  Little Ishikawa, MD  Electrophysiologist:  Lanier Prude, MD   Referring MD: Bennie Pierini, *   Chief Complaint  Patient presents with   Atrial Fibrillation    History of Present Illness:    Thomas Sandoval. is a 67 y.o. male with a hx of alcohol abuse, tobacco abuse, COPD, perforated gastric ulcer status postrepair in 2016, T2DM, hypertension, PAD, MVP, atrial fibrillation/flutter, systolic heart failure who presents for follow-up.  He was hospitalized September 2022 with acute pancreatitis found to be in A. fib.  Echo at that time showed EF 30 to 35%.  He left AMA.  At follow-up with his PCP 12/17/2020 he was still in A. fib and started on Xarelto and diltiazem.  He was referred to A. fib clinic, seen on 12/25/2020.  He underwent cardioversion on 01/20/2021 with successful restoration of sinus rhythm.  Echocardiogram 08/26/2017 showed EF 55 to 60%, mild mitral valve prolapse.  Echo 12/05/2020 showed EF 30 to 35%, global hypokinesis, and low normal RV function, moderate left atrial enlargement, trivial MR.  Zio patch x3 days on 02/10/2021 showed high percent A. fib burden with average rate 95 bpm.  Echocardiogram 05/21/2021 showed EF 55 to 60%, grade 2 diastolic dysfunction, normal RV function, no significant valvular disease.  He was admitted 11/2021 after being found unresponsive with significant hypoglycemia and hypotension.  EKG with aVR elevation and diffuse ST depressions, suggesting global ischemia.  EKG normalized with correction of his hypoglycemia.  He was seen by cardiology and outpatient coronary CT was recommended.  Coronary CTA was attempted but he was in A-fib on presentation and CT was canceled.  He was admitted for Tikosyn loading 02/2022.  Converted to sinus rhythm on Tikosyn.  Since last clinic  visit, he reports he is doing okay.  He has Kardia mobile device, shows intermittent A-fib.  Reports continues to have shortness of breath and some lightheadedness but denies any syncope.  Denies any chest pain.  Reports lower extremity edema has improved.  He is taking Xarelto, denies any bleeding issues.  He is smoking 0.5 packs/day.  Reports quit drinking alcohol.  Wt Readings from Last 3 Encounters:  06/24/22 180 lb 12.8 oz (82 kg)  04/20/22 185 lb (83.9 kg)  03/26/22 192 lb 3.2 oz (87.2 kg)    Past Medical History:  Diagnosis Date   Arthritis    Bladder cancer    Chronic back pain    Chronic combined systolic and diastolic CHF (congestive heart failure) 07/16/2021   COPD (chronic obstructive pulmonary disease)    DDD (degenerative disc disease)    Diabetic retinopathy of both eyes    Essential hypertension    GERD (gastroesophageal reflux disease)    History of atrial flutter 02/07/2011   Converted to NSR with Cardizem   History of chronic bronchitis    History of hemolytic anemia 02/07/2011   secondary to Avelox   HOH (hard of hearing)    HOH (hard of hearing)    no eardrum and nerve damage on R, also HOH on L   Mild renal insufficiency 08/25/2017   Mitral valve prolapse    a. 2D Echo 11/27/14: EF 55-60%; images were inadequate for LV wall motion assessment, + mild late systolic mitral valve prolapse involving the anterior leaflet.   PAD (peripheral artery disease) 04/09/2014  Dr Myra Gianotti; bilateral SFA occlusion, R mid, L distal   PAF (paroxysmal atrial fibrillation)    a. Dx 11/2014 during admission for perf ulcer.   Perforated ulcer    a. 11/2014 s/p surgery.   Productive cough    Smokers' cough    Type 1 diabetes mellitus 03/10/1975    Past Surgical History:  Procedure Laterality Date   CARDIOVERSION N/A 01/20/2021   Procedure: CARDIOVERSION;  Surgeon: Pricilla Riffle, MD;  Location: [facility] ENDOSCOPY;  Service: Cardiovascular;  Laterality: N/A;   CYSTOSCOPY WITH  URETEROSCOPY Right 08/14/2013   Procedure: CYSTOSCOPY WITH URETEROSCOPY BLADDER BIOPSY ;  Surgeon: Garnett Farm, MD;  Location: [facility];  Service: Urology;  Laterality: Right;   ESOPHAGOGASTRODUODENOSCOPY N/A 02/06/2013   Procedure: ESOPHAGOGASTRODUODENOSCOPY (EGD);  Surgeon: Hart Carwin, MD;  Location: [facility] ENDOSCOPY;  Service: Endoscopy;  Laterality: N/A;   LAPAROSCOPY N/A 11/25/2014   Procedure: LAPAROSCOPIC PRIMARY REPAIR OF PERFORATED PREPYLORIC ULCER WITH Peggye Ley;  Surgeon: Gaynelle Adu, MD;  Location: [facility] OR;  Service: General;  Laterality: N/A;   TONSILLECTOMY  as child   TRANSTHORACIC ECHOCARDIOGRAM  02-17-2011   MODERATE LVH/  EF 65%   TRANSURETHRAL RESECTION OF BLADDER TUMOR WITH GYRUS (TURBT-GYRUS) N/A 06/12/2013   Procedure: TRANSURETHRAL RESECTION OF BLADDER TUMOR WITH GYRUS (TURBT-GYRUS);  Surgeon: Garnett Farm, MD;  Location: [facility];  Service: Urology;  Laterality: N/A;   TYMPANIC MEMBRANE REPAIR  as child    Current Medications: Current Meds  Medication Sig   albuterol (VENTOLIN HFA) 108 (90 Base) MCG/ACT inhaler Inhale 2 puffs into the lungs every 6 (six) hours as needed for wheezing or shortness of breath.   cefUROXime (CEFTIN) 250 MG tablet Take 1 tablet (250 mg total) by mouth 2 (two) times daily with a meal.   cetirizine (ZYRTEC) 10 MG tablet Take 10 mg by mouth daily.   dofetilide (TIKOSYN) 250 MCG capsule TAKE 1 CAPSULE BY MOUTH 2 TIMES DAILY.   doxycycline (VIBRA-TABS) 100 MG tablet Take 1 tablet (100 mg total) by mouth 2 (two) times daily.   empagliflozin (JARDIANCE) 10 MG TABS tablet Take 1 tablet (10 mg total) by mouth daily before breakfast.   famotidine (PEPCID) 20 MG tablet Take 1 tablet (20 mg total) by mouth every morning. Reported on 04/16/2015   Fluticasone-Umeclidin-Vilant (TRELEGY ELLIPTA) 100-62.5-25 MCG/ACT AEPB INHALE 1 PUFF INTO THE LUNGS TWICE A DAY   furosemide (LASIX) 40 MG tablet Take 0.5 tablets (20 mg total)  by mouth daily.   HYDROcodone-acetaminophen (NORCO) 5-325 MG tablet Take 1 tablet by mouth every 8 (eight) hours as needed for moderate pain.   insulin glargine (LANTUS) 100 UNIT/ML injection Inject 0.2 mLs (20 Units total) into the skin daily. (Patient taking differently: Inject 42 Units into the skin in the morning.)   Insulin Syringe-Needle U-100 (B-D INS SYR ULTRAFINE 1CC/30G) 30G X 1/2" 1 ML MISC Use 4 times a day with insulin Dx E11.9   Insulin Syringes, Disposable, U-100 1 ML MISC Use 4 times a day for insulin injection Dx E11.9   ipratropium-albuterol (DUONEB) 0.5-2.5 (3) MG/3ML SOLN TAKE 3 MLS BY NEBULIZATION EVERY 4 (FOUR) HOURS AS NEEDED.   levalbuterol (XOPENEX) 0.31 MG/3ML nebulizer solution Take 3 mLs (0.31 mg total) by nebulization every 4 (four) hours as needed for wheezing.   losartan (COZAAR) 50 MG tablet TAKE 1 TABLET BY MOUTH EVERY DAY   metoprolol succinate (TOPROL-XL) 50 MG 24 hr tablet Take 1.5 tablets (75 mg total) by  mouth 2 (two) times daily. Take with or immediately following a meal.   naloxone (NARCAN) nasal spray 4 mg/0.1 mL Place 1 spray into the nose as needed (For opiate overdose).   NOVOLOG 100 UNIT/ML injection INJECT SUBQ PER SLIDING SCALE 190-200=2 UNITS. 300 & ABOVE=7 UNITS   OVER THE COUNTER MEDICATION Take 1-2 tablets by mouth See admin instructions. Super Beets gummies- Chew 1-2 gummies by mouth every day   OXYGEN Inhale 3 L/min into the lungs continuous.   rivaroxaban (XARELTO) 20 MG TABS tablet Take 1 tablet (20 mg total) by mouth daily with supper.     Allergies:   Avelox [moxifloxacin hcl in nacl], Azithromycin, and Bactrim [sulfamethoxazole-trimethoprim]   Social History   Socioeconomic History   Marital status: Married    Spouse name: Not on file   Number of children: Not on file   Years of education: Not on file   Highest education level: Not on file  Occupational History   Occupation: Tobacco Farmer  Tobacco Use   Smoking status: Every Day     Packs/day: 1.00    Years: 30.00    Additional pack years: 0.00    Total pack years: 30.00    Types: Cigarettes   Smokeless tobacco: Former    Quit date: 06/08/1978   Tobacco comments:    1  pack of cigarettes smoked daily ARJ 02/27/22  Vaping Use   Vaping Use: Never used  Substance and Sexual Activity   Alcohol use: Yes    Comment: 5 quarts per week 12/26/21   Drug use: No   Sexual activity: Not on file  Other Topics Concern   Not on file  Social History Narrative   Lives in [place] with wife and 2 sons.    Social Determinants of Health   Financial Resource Strain: Not on file  Food Insecurity: Patient Declined (11/27/2021)   Hunger Vital Sign    Worried About Running Out of Food in the Last Year: Patient declined    Ran Out of Food in the Last Year: Patient declined  Transportation Needs: Patient Declined (11/27/2021)   PRAPARE - Administrator, Civil Service (Medical): Patient declined    Lack of Transportation (Non-Medical): Patient declined  Physical Activity: Not on file  Stress: Not on file  Social Connections: Not on file     Family History: The patient's family history includes Breast cancer in his mother; Cancer in his mother; Diabetes in his son; Heart attack in his father; Heart disease in his father; Rheumatic fever in his father.  ROS:   Please see the history of present illness.     All other systems reviewed and are negative.  EKGs/Labs/Other Studies Reviewed:    The following studies were reviewed today:   EKG:   06/24/22: Normal sinus rhythm, rate 60, QTc 452 02/04/22: Atrial fibrillation, rate 104 11/19/2021: Normal sinus rhythm, rate 72, left axis deviation 08/20/2021: Atrial flutter with variable conduction, rate 87 05/20/21: Normal sinus rhythm, rate 61, left axis deviation, nonspecific T wave flattening, Q waves in leads III, aVF 03/14/21:NSR, ST depression<1 mm in inferior leads and V4-6, rate 61  Recent Labs: 08/17/2021: TSH  0.322 02/02/2022: ALT 14; Hemoglobin 15.5; Platelets 329 02/11/2022: B Natriuretic Peptide 886.2 03/26/2022: BUN 30; Creatinine, Ser 2.03; Magnesium 2.0; Potassium 3.3; Sodium 133  Recent Lipid Panel    Component Value Date/Time   CHOL 104 02/02/2022 1426   TRIG 98 02/02/2022 1426   HDL 48 02/02/2022 1426  CHOLHDL 2.2 02/02/2022 1426   LDLCALC 37 02/02/2022 1426    Physical Exam:    VS:  BP (!) 154/60   Pulse 60   Ht 5\' 9"  (1.753 m)   Wt 180 lb 12.8 oz (82 kg)   BMI 26.70 kg/m     Wt Readings from Last 3 Encounters:  06/24/22 180 lb 12.8 oz (82 kg)  04/20/22 185 lb (83.9 kg)  03/26/22 192 lb 3.2 oz (87.2 kg)     GEN:  Well nourished, well developed in no acute distress HEENT: Normal NECK: No JVD; No carotid bruits CARDIAC: RRR, no murmurs, rubs, gallops RESPIRATORY:  Clear to auscultation without rales, wheezing or rhonchi  ABDOMEN: Soft, non-tender, non-distended MUSCULOSKELETAL: 1+ edema; No deformity  SKIN: Warm and dry NEUROLOGIC:  Alert and oriented x 3 PSYCHIATRIC:  Normal affect   ASSESSMENT:    1. Persistent atrial fibrillation   2. Chronic combined systolic and diastolic CHF (congestive heart failure)   3. Essential hypertension   4. Shortness of breath   5. Tobacco use      PLAN:    Persistent atrial fibrillation/flutter: CHA2DS2-VASc score 4.  Underwent successful cardioversion on 01/20/2021, but was back in A. fib at follow-up appointment on 01/24/2021.  Zio patch x3 days on 02/10/2021 showed 100% percent A. fib burden with average rate 95 bpm.   -Continue Xarelto 20 mg daily -Continue Toprol-XL 100 mg daily -Given possible tachycardia induced cardiomyopathy, would recommend rhythm control strategy.  Referred to EP to consider ablation versus antiarrhythmic.  Seen by Dr Lalla Brothers.  Admitted 02/2022 for Tikosyn loading.  Continue Tikosyn 250 mcg twice daily.  Qtc 452 today.  Kardia mobile device shows intermittent Afib, but overall appears to be  maintaining sinus rhythm  Chronic combined systolic and diastolic heart failure: EF 30 to 35% on echocardiogram during admission with acute pancreatitis in September 2022.  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  Echocardiogram 05/21/2021 showed EF 55 to 60% -Continue Lasix 20 mg daily.  Check CMET, magnesium -Continue Toprol-XL 100 mg daily -Continue losartan 50 mg daily -Continue Jardiance 10 mg daily  Abnormal EKG: He was admitted 11/2021 after being found unresponsive with significant hypoglycemia and hypotension.  EKG with aVR elevation and diffuse ST depressions, suggesting global ischemia.  EKG normalized with correction of his hypoglycemia.  He was seen by cardiology and outpatient coronary CT was recommended.  He denies any chest pain but having dyspnea on exertion -Recommend coronary CTA to evaluate for obstructive CAD.  Coronary CTA was attempted but he was in A-fib on presentation and was canceled.  Has had CT chest however which shows severe coronary calcifications.  Given severe coronary calcifications, would recommend stress PET instead of coronary CTA  Alcohol abuse: Counseled risk of alcohol abuse and cessation recommended.  He reports he stopped drinking after starting Tikosyn  Tobacco use: Patient counseled on the risk of tobacco use and cessation strongly encouraged.  Hypertension: Continue Toprol-XL, losartan  T2DM: On insulin  PAD: ABIs 2019 are severely reduced (right 0.52, left 0.48).  Lower extremity duplex 04/2014 showed bilateral SFA occlusions.  ABIs 02/2022 were 0.36 on right, 0.45 on left.  Follows with vascular surgery   RTC in 3 months  Shared Decision Making/Informed Consent The risks [chest pain, shortness of breath, cardiac arrhythmias, dizziness, blood pressure fluctuations, myocardial infarction, stroke/transient ischemic attack, nausea, vomiting, allergic reaction, radiation exposure, metallic taste sensation and life-threatening complications  (estimated to be 1 in 10,000)], benefits (risk stratification, diagnosing coronary artery disease,  treatment guidance) and alternatives of a cardiac PET stress test were discussed in detail with Mr. Walston and he agrees to proceed.    Medication Adjustments/Labs and Tests Ordered: Current medicines are reviewed at length with the patient today.  Concerns regarding medicines are outlined above.  Orders Placed This Encounter  Procedures   NM PET CT CARDIAC PERFUSION MULTI W/ABSOLUTE BLOODFLOW   Comprehensive metabolic panel   CBC   Magnesium   EKG 12-Lead    No orders of the defined types were placed in this encounter.   Patient Instructions  Medication Instructions:  Your physician recommends that you continue on your current medications as directed. Please refer to the Current Medication list given to you today.  *If you need a refill on your cardiac medications before your next appointment, please call your pharmacy*   Lab Work: CMET, CBC, Mag today  If you have labs (blood work) drawn today and your tests are completely normal, you will receive your results only by: MyChart Message (if you have MyChart) OR A paper copy in the mail If you have any lab test that is abnormal or we need to change your treatment, we will call you to review the results.   Testing/Procedures: CARDIAC PET- Your physician has requested that you have a Cardiac Pet Stress Test. This testing is completed at [facility] ([address] [place], [place] [address]). The schedulers will call you to get this scheduled. Please follow instructions below and call the office with any questions/concerns [phone]).  Follow-Up: At [facility], you and your health needs are our priority.  As part of our continuing mission to provide you with exceptional heart care, we have created designated Provider Care Teams.  These Care Teams include your primary Cardiologist (physician) and  Advanced Practice Providers (APPs -  Physician Assistants and Nurse Practitioners) who all work together to provide you with the care you need, when you need it.  We recommend signing up for the patient portal called "MyChart".  Sign up information is provided on this After Visit Summary.  MyChart is used to connect with patients for Virtual Visits (Telemedicine).  Patients are able to view lab/test results, encounter notes, upcoming appointments, etc.  Non-urgent messages can be sent to your provider as well.   To learn more about what you can do with MyChart, go to [email or web address].    Your next appointment:   3 month(s)  Provider:   Little Ishikawa, MD     Other Instructions How to Prepare for Your Cardiac PET/CT Stress Test:  1. Please do not take these medications before your test:   Medications that may interfere with the cardiac pharmacological stress agent (ex. nitrates - including erectile dysfunction medications, isosorbide mononitrate, tamulosin or beta-blockers) the day of the exam. (Erectile dysfunction medication should be held for at least 72 hrs prior to test) Theophylline containing medications for 12 hours. Dipyridamole 48 hours prior to the test. Your remaining medications may be taken with water.  2. Nothing to eat or drink, except water, 3 hours prior to arrival time.   NO caffeine/decaffeinated products, or chocolate 12 hours prior to arrival.  3. NO perfume, cologne or lotion  4. Total time is 1 to 2 hours; you may want to bring reading material for the waiting time.  5. Please report to Radiology at the [facility] Main Entrance 30 minutes early for your test.  [address] [place], [place] [address]  Diabetic Preparation:  Hold oral medications. You may take NPH and Lantus insulin. Do not take Humalog or Humulin R (Regular Insulin) the day of your test. Check blood sugars prior to leaving the house. If able to eat breakfast  prior to 3 hour fasting, you may take all medications, including your insulin, Do not worry if you miss your breakfast dose of insulin - start at your next meal.  IF YOU THINK YOU MAY BE PREGNANT, OR ARE NURSING PLEASE INFORM THE TECHNOLOGIST.  In preparation for your appointment, medication and supplies will be purchased.  Appointment availability is limited, so if you need to cancel or reschedule, please call the Radiology Department [phone]  24 hours in advance to avoid a cancellation fee of $100.00  What to Expect After you Arrive:  Once you arrive and check in for your appointment, you will be taken to a preparation room within the Radiology Department.  A technologist or Nurse will obtain your medical history, verify that you are correctly prepped for the exam, and explain the procedure.  Afterwards,  an IV will be started in your arm and electrodes will be placed on your skin for EKG monitoring during the stress portion of the exam. Then you will be escorted to the PET/CT scanner.  There, staff will get you positioned on the scanner and obtain a blood pressure and EKG.  During the exam, you will continue to be connected to the EKG and blood pressure machines.  A small, safe amount of a radioactive tracer will be injected in your IV to obtain a series of pictures of your heart along with an injection of a stress agent.    After your Exam:  It is recommended that you eat a meal and drink a caffeinated beverage to counter act any effects of the stress agent.  Drink plenty of fluids for the remainder of the day and urinate frequently for the first couple of hours after the exam.  Your doctor will inform you of your test results within 7-10 business days.  For questions about your test or how to prepare for your test, please call: Rockwell Alexandria, Cardiac Imaging Nurse Navigator  Larey Brick, Cardiac Imaging Nurse Navigator Office: [phone]     Signed, Little Ishikawa, MD   06/24/2022 10:11 AM    [facility]

## 2022-06-24 ENCOUNTER — Encounter: Payer: Self-pay | Admitting: Cardiology

## 2022-06-24 ENCOUNTER — Ambulatory Visit: Payer: BC Managed Care – PPO | Attending: Cardiology | Admitting: Cardiology

## 2022-06-24 VITALS — BP 154/60 | HR 60 | Ht 69.0 in | Wt 180.8 lb

## 2022-06-24 DIAGNOSIS — R0602 Shortness of breath: Secondary | ICD-10-CM | POA: Diagnosis not present

## 2022-06-24 DIAGNOSIS — Z720 Tobacco use: Secondary | ICD-10-CM

## 2022-06-24 DIAGNOSIS — I10 Essential (primary) hypertension: Secondary | ICD-10-CM | POA: Diagnosis not present

## 2022-06-24 DIAGNOSIS — I4819 Other persistent atrial fibrillation: Secondary | ICD-10-CM | POA: Diagnosis not present

## 2022-06-24 DIAGNOSIS — I5042 Chronic combined systolic (congestive) and diastolic (congestive) heart failure: Secondary | ICD-10-CM | POA: Diagnosis not present

## 2022-06-24 NOTE — Patient Instructions (Addendum)
Medication Instructions:  Your physician recommends that you continue on your current medications as directed. Please refer to the Current Medication list given to you today.  *If you need a refill on your cardiac medications before your next appointment, please call your pharmacy*   Lab Work: CMET, CBC, Mag today  If you have labs (blood work) drawn today and your tests are completely normal, you will receive your results only by: MyChart Message (if you have MyChart) OR A paper copy in the mail If you have any lab test that is abnormal or we need to change your treatment, we will call you to review the results.   Testing/Procedures: CARDIAC PET- Your physician has requested that you have a Cardiac Pet Stress Test. This testing is completed at [facility] ([address] [place], [place] [address]). The schedulers will call you to get this scheduled. Please follow instructions below and call the office with any questions/concerns [phone]).  Follow-Up: At [facility], you and your health needs are our priority.  As part of our continuing mission to provide you with exceptional heart care, we have created designated Provider Care Teams.  These Care Teams include your primary Cardiologist (physician) and Advanced Practice Providers (APPs -  Physician Assistants and Nurse Practitioners) who all work together to provide you with the care you need, when you need it.  We recommend signing up for the patient portal called "MyChart".  Sign up information is provided on this After Visit Summary.  MyChart is used to connect with patients for Virtual Visits (Telemedicine).  Patients are able to view lab/test results, encounter notes, upcoming appointments, etc.  Non-urgent messages can be sent to your provider as well.   To learn more about what you can do with MyChart, go to [email or web address].    Your next appointment:   3 month(s)  Provider:    Little Ishikawa, MD     Other Instructions How to Prepare for Your Cardiac PET/CT Stress Test:  1. Please do not take these medications before your test:   Medications that may interfere with the cardiac pharmacological stress agent (ex. nitrates - including erectile dysfunction medications, isosorbide mononitrate, tamulosin or beta-blockers) the day of the exam. (Erectile dysfunction medication should be held for at least 72 hrs prior to test) Theophylline containing medications for 12 hours. Dipyridamole 48 hours prior to the test. Your remaining medications may be taken with water.  2. Nothing to eat or drink, except water, 3 hours prior to arrival time.   NO caffeine/decaffeinated products, or chocolate 12 hours prior to arrival.  3. NO perfume, cologne or lotion  4. Total time is 1 to 2 hours; you may want to bring reading material for the waiting time.  5. Please report to Radiology at the [facility] Main Entrance 30 minutes early for your test.  [address] [place], [place] [address]  Diabetic Preparation:  Hold oral medications. You may take NPH and Lantus insulin. Do not take Humalog or Humulin R (Regular Insulin) the day of your test. Check blood sugars prior to leaving the house. If able to eat breakfast prior to 3 hour fasting, you may take all medications, including your insulin, Do not worry if you miss your breakfast dose of insulin - start at your next meal.  IF YOU THINK YOU MAY BE PREGNANT, OR ARE NURSING PLEASE INFORM THE TECHNOLOGIST.  In preparation for your appointment, medication and supplies will be purchased.  Appointment availability is limited, so if you need to cancel or reschedule, please call the Radiology Department at [phone]  24 hours in advance to avoid a cancellation fee of $100.00  What to Expect After you Arrive:  Once you arrive and check in for your appointment, you will be taken to a preparation room within the  Radiology Department.  A technologist or Nurse will obtain your medical history, verify that you are correctly prepped for the exam, and explain the procedure.  Afterwards,  an IV will be started in your arm and electrodes will be placed on your skin for EKG monitoring during the stress portion of the exam. Then you will be escorted to the PET/CT scanner.  There, staff will get you positioned on the scanner and obtain a blood pressure and EKG.  During the exam, you will continue to be connected to the EKG and blood pressure machines.  A small, safe amount of a radioactive tracer will be injected in your IV to obtain a series of pictures of your heart along with an injection of a stress agent.    After your Exam:  It is recommended that you eat a meal and drink a caffeinated beverage to counter act any effects of the stress agent.  Drink plenty of fluids for the remainder of the day and urinate frequently for the first couple of hours after the exam.  Your doctor will inform you of your test results within 7-10 business days.  For questions about your test or how to prepare for your test, please call: Rockwell Alexandria, Cardiac Imaging Nurse Navigator  Larey Brick, Cardiac Imaging Nurse Navigator Office: [phone]

## 2022-06-25 ENCOUNTER — Other Ambulatory Visit: Payer: Self-pay | Admitting: *Deleted

## 2022-06-25 ENCOUNTER — Ambulatory Visit (HOSPITAL_COMMUNITY): Payer: BC Managed Care – PPO | Admitting: Physician Assistant

## 2022-06-25 DIAGNOSIS — I5042 Chronic combined systolic (congestive) and diastolic (congestive) heart failure: Secondary | ICD-10-CM

## 2022-06-25 DIAGNOSIS — I10 Essential (primary) hypertension: Secondary | ICD-10-CM

## 2022-06-25 DIAGNOSIS — E875 Hyperkalemia: Secondary | ICD-10-CM

## 2022-06-25 LAB — CBC
Hematocrit: 41.5 % (ref 37.5–51.0)
Hemoglobin: 13.5 g/dL (ref 13.0–17.7)
MCH: 31.6 pg (ref 26.6–33.0)
MCHC: 32.5 g/dL (ref 31.5–35.7)
MCV: 97 fL (ref 79–97)
Platelets: 257 10*3/uL (ref 150–450)
RBC: 4.27 x10E6/uL (ref 4.14–5.80)
RDW: 12.1 % (ref 11.6–15.4)
WBC: 7.3 10*3/uL (ref 3.4–10.8)

## 2022-06-25 LAB — COMPREHENSIVE METABOLIC PANEL
ALT: 11 IU/L (ref 0–44)
AST: 21 IU/L (ref 0–40)
Albumin/Globulin Ratio: 1.5 (ref 1.2–2.2)
Albumin: 4.1 g/dL (ref 3.9–4.9)
Alkaline Phosphatase: 77 IU/L (ref 44–121)
BUN/Creatinine Ratio: 13 (ref 10–24)
BUN: 19 mg/dL (ref 8–27)
Bilirubin Total: 0.6 mg/dL (ref 0.0–1.2)
CO2: 25 mmol/L (ref 20–29)
Calcium: 10.1 mg/dL (ref 8.6–10.2)
Chloride: 96 mmol/L (ref 96–106)
Creatinine, Ser: 1.47 mg/dL — ABNORMAL HIGH (ref 0.76–1.27)
Globulin, Total: 2.7 g/dL (ref 1.5–4.5)
Glucose: 198 mg/dL — ABNORMAL HIGH (ref 70–99)
Potassium: 5.5 mmol/L — ABNORMAL HIGH (ref 3.5–5.2)
Sodium: 137 mmol/L (ref 134–144)
Total Protein: 6.8 g/dL (ref 6.0–8.5)
eGFR: 53 mL/min/{1.73_m2} — ABNORMAL LOW (ref >59)

## 2022-06-25 LAB — MAGNESIUM: Magnesium: 2 mg/dL (ref 1.6–2.3)

## 2022-07-12 ENCOUNTER — Other Ambulatory Visit: Payer: Self-pay | Admitting: Nurse Practitioner

## 2022-07-12 DIAGNOSIS — E10649 Type 1 diabetes mellitus with hypoglycemia without coma: Secondary | ICD-10-CM

## 2022-07-13 MED ORDER — INSULIN GLARGINE 100 UNIT/ML ~~LOC~~ SOLN: SUBCUTANEOUS | 0 refills | Status: DC

## 2022-07-13 NOTE — Addendum Note (Signed)
Addended by: Tamera Punt on: 07/13/2022 03:16 PM   Modules accepted: Orders

## 2022-07-20 ENCOUNTER — Encounter
Payer: BC Managed Care – PPO | Attending: Physical Medicine and Rehabilitation | Admitting: Physical Medicine and Rehabilitation

## 2022-07-20 ENCOUNTER — Encounter: Payer: Self-pay | Admitting: Physical Medicine and Rehabilitation

## 2022-07-20 VITALS — BP 106/62 | HR 80 | Ht 69.0 in | Wt 180.0 lb

## 2022-07-20 DIAGNOSIS — Z79891 Long term (current) use of opiate analgesic: Secondary | ICD-10-CM | POA: Insufficient documentation

## 2022-07-20 DIAGNOSIS — G894 Chronic pain syndrome: Secondary | ICD-10-CM | POA: Insufficient documentation

## 2022-07-20 DIAGNOSIS — M545 Low back pain, unspecified: Secondary | ICD-10-CM | POA: Insufficient documentation

## 2022-07-20 DIAGNOSIS — J9601 Acute respiratory failure with hypoxia: Secondary | ICD-10-CM | POA: Diagnosis not present

## 2022-07-20 DIAGNOSIS — M533 Sacrococcygeal disorders, not elsewhere classified: Secondary | ICD-10-CM | POA: Insufficient documentation

## 2022-07-20 DIAGNOSIS — G8929 Other chronic pain: Secondary | ICD-10-CM | POA: Diagnosis not present

## 2022-07-20 DIAGNOSIS — Z5181 Encounter for therapeutic drug level monitoring: Secondary | ICD-10-CM | POA: Diagnosis not present

## 2022-07-20 NOTE — Progress Notes (Signed)
Subjective:    Patient ID: Thomas Ramus., male    DOB: 12/30/1955, 67 y.o.   MRN: [id number]  HPI   Thomas Mcniven. is a 67 y.o. year old male  who  has a past medical history of Arthritis, Bladder cancer (HCC), Chronic back pain, Chronic combined systolic and diastolic CHF (congestive heart failure) (HCC) (07/16/2021), COPD (chronic obstructive pulmonary disease) (HCC), DDD (degenerative disc disease), Diabetic retinopathy of both eyes (HCC), Essential hypertension, GERD (gastroesophageal reflux disease), History of atrial flutter (02/07/2011), History of chronic bronchitis, History of hemolytic anemia (02/07/2011), HOH (hard of hearing), HOH (hard of hearing), Mild renal insufficiency (08/25/2017), Mitral valve prolapse, PAD (peripheral artery disease) (HCC) (04/09/2014), PAF (paroxysmal atrial fibrillation) (HCC), Perforated ulcer (HCC), Productive cough, Smokers' cough (HCC), and Type 1 diabetes mellitus (HCC) (03/10/1975).   They are presenting to PM&R clinic for follow up related to low back pain .  Plan from last visit: Chronic pain syndrome Assessment & Plan: Indication for chronic opioid: M54.9 Medication and dose: Norco 5-325 mg TID PRN # pills per month: 90 Last UDS date: 02/10/22 Opioid Treatment Agreement signed (Y/N): Y Opioid Treatment Agreement last reviewed with patient:  11/17/21 NCCSRS reviewed this encounter (include red flags):  Y   Medium Risk (10-90 MME) UDS every 3-6 months NCCSR check every visit Q3M follow up     Spondylosis without myelopathy or radiculopathy, lumbar region   ETOH abuse Assessment & Plan: Patient is doing well abstaining from alcohol on medication. No w/d. Drug screen last visit negative for ETOH.      Admission for long-term opiate analgesic use   Chronic bilateral low back pain, unspecified whether sciatica present Assessment & Plan: I will prescribe you another 3 months of Norco 5 mg 1 tablet every 8 hours. You can use  a lidocaine patch for your rib pain over the counter if needed.    I recommend you follow up with Dr. Wynn Banker for SI joint ablation   Follow up in 3 months. Call clinic if any questions.     COPD with chronic bronchitis and emphysema (HCC) Assessment & Plan: Showed patient acapella device to help mobilize secretions with cough, to purchase OTC     Interval Hx:  - Therapies: No doing any activity at this point; not tolerating well. Wife is encouraging upper extremity exercises but he says his back hurts too much to tolerate.    - Follow ups: Dr. Wynn Banker saw him for [facility] [place] joint ablation; is waiting for medicare to switch over July 1 before getting procedure done.    - Falls: none   - DME: none   - Medications: Tried lidocaine patches on his back; thinks they put him into atrial fibrillation. He tried menthol patches.   Is using Norco at 6, 12, and 6; he says he is asleep at midnight most nights, but doesn't sleep well because he is on a fluid pill and has to wake up often.    - Other concerns:    Pain Inventory Average Pain 7 Pain Right Now 7 My pain is sharp, burning, and aching  In the last 24 hours, has pain interfered with the following? General activity 8 Relation with others 1 Enjoyment of life 3 What TIME of day is your pain at its worst? morning , daytime, evening, and night Sleep (in general) Poor  Pain is worse with: walking, bending, sitting, standing, and some activites Pain improves with: rest Relief from Meds: 4  Family  History  Problem Relation Age of Onset   Breast cancer Mother    Cancer Mother        Breast   Rheumatic fever Father    Heart disease Father    Heart attack Father        Massive    Diabetes Son    Social History   Socioeconomic History   Marital status: Married    Spouse name: Not on file   Number of children: Not on file   Years of education: Not on file   Highest education level: Not on file  Occupational History    Occupation: Tobacco Farmer  Tobacco Use   Smoking status: Every Day    Packs/day: 1.00    Years: 30.00    Additional pack years: 0.00    Total pack years: 30.00    Types: Cigarettes   Smokeless tobacco: Former    Quit date: 06/08/1978   Tobacco comments:    1  pack of cigarettes smoked daily ARJ 02/27/22  Vaping Use   Vaping Use: Never used  Substance and Sexual Activity   Alcohol use: Yes    Comment: 5 quarts per week 12/26/21   Drug use: No   Sexual activity: Not on file  Other Topics Concern   Not on file  Social History Narrative   Lives in [place] with wife and 2 sons.    Social Determinants of Health   Financial Resource Strain: Not on file  Food Insecurity: Patient Declined (11/27/2021)   Hunger Vital Sign    Worried About Running Out of Food in the Last Year: Patient declined    Ran Out of Food in the Last Year: Patient declined  Transportation Needs: Patient Declined (11/27/2021)   PRAPARE - Administrator, Civil Service (Medical): Patient declined    Lack of Transportation (Non-Medical): Patient declined  Physical Activity: Not on file  Stress: Not on file  Social Connections: Not on file   Past Surgical History:  Procedure Laterality Date   CARDIOVERSION N/A 01/20/2021   Procedure: CARDIOVERSION;  Surgeon: Pricilla Riffle, MD;  Location: [facility] ENDOSCOPY;  Service: Cardiovascular;  Laterality: N/A;   CYSTOSCOPY WITH URETEROSCOPY Right 08/14/2013   Procedure: CYSTOSCOPY WITH URETEROSCOPY BLADDER BIOPSY ;  Surgeon: Garnett Farm, MD;  Location: [facility];  Service: Urology;  Laterality: Right;   ESOPHAGOGASTRODUODENOSCOPY N/A 02/06/2013   Procedure: ESOPHAGOGASTRODUODENOSCOPY (EGD);  Surgeon: Hart Carwin, MD;  Location: [facility] ENDOSCOPY;  Service: Endoscopy;  Laterality: N/A;   LAPAROSCOPY N/A 11/25/2014   Procedure: LAPAROSCOPIC PRIMARY REPAIR OF PERFORATED PREPYLORIC ULCER WITH Peggye Ley;  Surgeon: Gaynelle Adu, MD;  Location: [facility] OR;  Service:  General;  Laterality: N/A;   TONSILLECTOMY  as child   TRANSTHORACIC ECHOCARDIOGRAM  02-17-2011   MODERATE LVH/  EF 65%   TRANSURETHRAL RESECTION OF BLADDER TUMOR WITH GYRUS (TURBT-GYRUS) N/A 06/12/2013   Procedure: TRANSURETHRAL RESECTION OF BLADDER TUMOR WITH GYRUS (TURBT-GYRUS);  Surgeon: Garnett Farm, MD;  Location: [facility];  Service: Urology;  Laterality: N/A;   TYMPANIC MEMBRANE REPAIR  as child   Past Surgical History:  Procedure Laterality Date   CARDIOVERSION N/A 01/20/2021   Procedure: CARDIOVERSION;  Surgeon: Pricilla Riffle, MD;  Location: [facility] ENDOSCOPY;  Service: Cardiovascular;  Laterality: N/A;   CYSTOSCOPY WITH URETEROSCOPY Right 08/14/2013   Procedure: CYSTOSCOPY WITH URETEROSCOPY BLADDER BIOPSY ;  Surgeon: Garnett Farm, MD;  Location: [facility];  Service: Urology;  Laterality: Right;   ESOPHAGOGASTRODUODENOSCOPY N/A 02/06/2013   Procedure: ESOPHAGOGASTRODUODENOSCOPY (EGD);  Surgeon: Hart Carwin, MD;  Location: [facility] ENDOSCOPY;  Service: Endoscopy;  Laterality: N/A;   LAPAROSCOPY N/A 11/25/2014   Procedure: LAPAROSCOPIC PRIMARY REPAIR OF PERFORATED PREPYLORIC ULCER WITH Peggye Ley;  Surgeon: Gaynelle Adu, MD;  Location[facility] OR;  Service: General;  Laterality: N/A;   TONSILLECTOMY  as child   TRANSTHORACIC ECHOCARDIOGRAM  02-17-2011   MODERATE LVH/  EF 65%   TRANSURETHRAL RESECTION OF BLADDER TUMOR WITH GYRUS (TURBT-GYRUS) N/A 06/12/2013   Procedure: TRANSURETHRAL RESECTION OF BLADDER TUMOR WITH GYRUS (TURBT-GYRUS);  Surgeon: Garnett Farm, MD;  Location: [facility];  Service: Urology;  Laterality: N/A;   TYMPANIC MEMBRANE REPAIR  as child   Past Medical History:  Diagnosis Date   Arthritis    Bladder cancer (HCC)    Chronic back pain    Chronic combined systolic and diastolic CHF (congestive heart failure) (HCC) 07/16/2021   COPD (chronic obstructive pulmonary disease) (HCC)    DDD (degenerative disc disease)    Diabetic  retinopathy of both eyes (HCC)    Essential hypertension    GERD (gastroesophageal reflux disease)    History of atrial flutter 02/07/2011   Converted to NSR with Cardizem   History of chronic bronchitis    History of hemolytic anemia 02/07/2011   secondary to Avelox   HOH (hard of hearing)    HOH (hard of hearing)    no eardrum and nerve damage on R, also HOH on L   Mild renal insufficiency 08/25/2017   Mitral valve prolapse    a. 2D Echo 11/27/14: EF 55-60%; images were inadequate for LV wall motion assessment, + mild late systolic mitral valve prolapse involving the anterior leaflet.   PAD (peripheral artery disease) (HCC) 04/09/2014   Dr Myra Gianotti; bilateral SFA occlusion, R mid, L distal   PAF (paroxysmal atrial fibrillation) (HCC)    a. Dx 11/2014 during admission for perf ulcer.   Perforated ulcer (HCC)    a. 11/2014 s/p surgery.   Productive cough    Smokers' cough (HCC)    Type 1 diabetes mellitus (HCC) 03/10/1975   BP 106/62   Pulse 80   Ht 5\' 9"  (1.753 m)   Wt 180 lb (81.6 kg)   SpO2 95%   BMI 26.58 kg/m   Opioid Risk Score:   Fall Risk Score:  `1  Depression screen PHQ 2/9     07/20/2022    1:26 PM 04/20/2022    2:52 PM 03/12/2022    2:54 PM 02/10/2022   10:29 AM 02/02/2022    2:35 PM 11/17/2021    9:30 AM 04/28/2021    2:17 PM  Depression screen PHQ 2/9  Decreased Interest 0 0 0 0 0 0 0  Down, Depressed, Hopeless 0 0 0 0 0 0 0  PHQ - 2 Score 0 0 0 0 0 0 0  Altered sleeping     0 1 0  Tired, decreased energy     0 1 0  Change in appetite     0 0 0  Feeling bad or failure about yourself      0 0 0  Trouble concentrating     0 0 0  Moving slowly or fidgety/restless     0 0 0  Suicidal thoughts     0 0 0  PHQ-9 Score     0 2 0  Difficult doing work/chores     Not  difficult at all  Not difficult at all      Review of Systems  Musculoskeletal:  Positive for back pain and gait problem.  All other systems reviewed and are negative.     Objective:    Physical Exam  PE: Constitution: Appropriate appearance for age. No apparent distress   Resp: CTAB, reduced air movement. No rales, rhonchi, or wheezing. + on oxygen concentrator with [place],  Cardio: RRR. No mumurs, rubs, or gallops. No peripheral edema. Abdomen: Nondistended. Nontender. +bowel sounds. Psych: Appropriate mood and affect Neuro: AAOx4. No apparent deficits. 5/5 strength in bilateral UE and LE in all myotomes.    Back Exam:    + TTP R>L SI joint   + R faber, Fadir, compression and Yeoman's tests   + Mild FABER on L    Facet loading: + Mildly increased LBP, bilateral   TTP at paraspinals: none   Forward bending: + Mildly increased bilateral LBP   Gait: Forward leaning, antalgic, no AD.             Assessment & Plan:   Thomas Craigen. is a 67 y.o. year old male  who  has a past medical history of Arthritis, Bladder cancer (HCC), Chronic back pain, Chronic combined systolic and diastolic CHF (congestive heart failure) (HCC) (07/16/2021), COPD (chronic obstructive pulmonary disease) (HCC), DDD (degenerative disc disease), Diabetic retinopathy of both eyes (HCC), Essential hypertension, GERD (gastroesophageal reflux disease), History of atrial flutter (02/07/2011), History of chronic bronchitis, History of hemolytic anemia (02/07/2011), HOH (hard of hearing), HOH (hard of hearing), Mild renal insufficiency (08/25/2017), Mitral valve prolapse, PAD (peripheral artery disease) (HCC) (04/09/2014), PAF (paroxysmal atrial fibrillation) (HCC), Perforated ulcer (HCC), Productive cough, Smokers' cough (HCC), and Type 1 diabetes mellitus (HCC) (03/10/1975).  They are presenting to PM&R clinic for follow up related to low back pain .  Chronic pain syndrome -     Drug Tox Monitor 1 w/Conf, Oral Fld -     Drug Tox Alc Metab w/Con, Oral Fld  For next pain medication, I will be trying to convert you to weekly buprenorphine patches.  You will start these when due for your next pain  medication.  I will place instructions for any adjunctive pain medications while on the patch on your prescription bottle.  If after 1 week, your pain is worse or intolerable, call the clinic and bring your patches into dispose and we will represcribe your Norco 5 mg 3 times daily.  I will give you enough medication for 3 months.  Follow-up with myself or Riley Lam, our nurse practitioner, in 3 months.  As reminder, please keep your blood sugars above 70 to prevent the sedating effect of low blood sugars.  Continue to abstain from alcohol.   Chronic bilateral low back pain without sciatica Maintain as much activity as possible; if you want, I am happy to write a prescription to send you back to physical therapy to help keep you active.  Chronic SI joint pain Once you are on Medicare, follow-up with Dr. Wynn Banker for SI joint ablation to help with your back pain.  Other orders -     Buprenorphine; Place 1 patch onto the skin once a week.  Dispense: 4 patch; Refill: 0

## 2022-07-20 NOTE — Patient Instructions (Signed)
For next pain medication, I will be trying to convert you to weekly buprenorphine patches.  You will start these when due for your next pain medication.  I will place instructions for any adjunctive pain medications while on the patch on your prescription bottle.  If after 1 week, your pain is worse or intolerable, call the clinic and bring your patches into dispose and we will represcribe your Norco 5 mg 3 times daily.  I will give you enough medication for 3 months.  Follow-up with myself or Riley Lam, our nurse practitioner, in 3 months.  As reminder, please keep your blood sugars above 70 to prevent the sedating effect of low blood sugars.  Continue to abstain from alcohol.   Once you are on Medicare, follow-up with Dr. Wynn Banker for SI joint ablation to help with your back pain.  Maintain as much activity as possible; if you want, I am happy to write a prescription to send you back to physical therapy to help keep you active.

## 2022-07-24 ENCOUNTER — Telehealth: Payer: Self-pay | Admitting: *Deleted

## 2022-07-24 MED ORDER — BUPRENORPHINE 10 MCG/HR TD PTWK: 1.0000 | MEDICATED_PATCH | TRANSDERMAL | 0 refills | Status: DC

## 2022-07-24 NOTE — Telephone Encounter (Signed)
Danieljr Kamm (Key: [id number]) [employer] [place] is processing your PA request and will respond shortly with next steps. You may close this dialog, return to your dashboard, and perform other tasks. To check for an update later, open this request again from your dashboard.  If you need assistance, please chat with CoverMyMeds or call us at [phone].

## 2022-07-26 MED ORDER — BUPRENORPHINE 10 MCG/HR TD PTWK: 1.0000 | MEDICATED_PATCH | TRANSDERMAL | 0 refills | Status: DC

## 2022-07-28 ENCOUNTER — Telehealth: Payer: Self-pay | Admitting: *Deleted

## 2022-07-28 NOTE — Telephone Encounter (Signed)
PA initiated via covermymeds Key [id number] Buprenorphine weekly patches

## 2022-07-30 ENCOUNTER — Telehealth: Payer: Self-pay | Admitting: *Deleted

## 2022-07-30 DIAGNOSIS — G8929 Other chronic pain: Secondary | ICD-10-CM

## 2022-07-30 DIAGNOSIS — Z79891 Long term (current) use of opiate analgesic: Secondary | ICD-10-CM

## 2022-07-30 DIAGNOSIS — G894 Chronic pain syndrome: Secondary | ICD-10-CM

## 2022-07-30 MED ORDER — HYDROCODONE-ACETAMINOPHEN 5-325 MG PO TABS: 1.0000 | ORAL_TABLET | Freq: Three times a day (TID) | ORAL | 0 refills | Status: AC | PRN

## 2022-07-30 NOTE — Telephone Encounter (Signed)
Mr Zumbach called and is going to be out of his medication. The Butrans patches have not yet been approved by his insurance but is in process. He may need another week or 2 of his hydrocodone until we can get the Butrans patches approved.

## 2022-08-09 ENCOUNTER — Other Ambulatory Visit: Payer: Self-pay | Admitting: Nurse Practitioner

## 2022-08-09 DIAGNOSIS — J449 Chronic obstructive pulmonary disease, unspecified: Secondary | ICD-10-CM

## 2022-08-10 ENCOUNTER — Telehealth: Payer: Self-pay | Admitting: *Deleted

## 2022-08-10 DIAGNOSIS — M545 Low back pain, unspecified: Secondary | ICD-10-CM

## 2022-08-10 DIAGNOSIS — Z79891 Long term (current) use of opiate analgesic: Secondary | ICD-10-CM

## 2022-08-10 DIAGNOSIS — Z5181 Encounter for therapeutic drug level monitoring: Secondary | ICD-10-CM

## 2022-08-10 DIAGNOSIS — G894 Chronic pain syndrome: Secondary | ICD-10-CM

## 2022-08-10 NOTE — Telephone Encounter (Signed)
Patient's spouse brought back Butrans patches for disposal. Patient would like pain medication sent into [facility].

## 2022-08-11 ENCOUNTER — Other Ambulatory Visit: Payer: Self-pay | Admitting: Nurse Practitioner

## 2022-08-11 DIAGNOSIS — E10649 Type 1 diabetes mellitus with hypoglycemia without coma: Secondary | ICD-10-CM

## 2022-08-11 DIAGNOSIS — I4819 Other persistent atrial fibrillation: Secondary | ICD-10-CM

## 2022-08-11 MED ORDER — HYDROCODONE-ACETAMINOPHEN 5-325 MG PO TABS: 1.0000 | ORAL_TABLET | Freq: Three times a day (TID) | ORAL | 0 refills | Status: DC | PRN

## 2022-08-11 MED ORDER — HYDROCODONE-ACETAMINOPHEN 5-325 MG PO TABS: 1.0000 | ORAL_TABLET | Freq: Three times a day (TID) | ORAL | 0 refills | Status: AC | PRN

## 2022-08-11 NOTE — Telephone Encounter (Signed)
Patient presented to clinic yesterday returning 3-4 buprenorphine patches.  Requesting return to prior medication regimen, as this was not working for him.    On PDMP review, patient had picked up temporary prescription for hydrocodone/acetaminophen 07/10/2023 1 tab 3 times daily #45 tabs for 15 days on 07/30/2022.    Sending refill with resumption of prior regimen for 08/14/2023, with 3 months of refills, and canceling further Butrans patch orders.

## 2022-08-12 ENCOUNTER — Telehealth: Payer: Self-pay | Admitting: *Deleted

## 2022-08-12 NOTE — Telephone Encounter (Signed)
Pt aware.

## 2022-08-12 NOTE — Telephone Encounter (Signed)
Patient inquiring about pain medication change.LVM to call office back Hydrocodone for June and July have already been sent.

## 2022-08-15 ENCOUNTER — Other Ambulatory Visit (HOSPITAL_COMMUNITY): Payer: Self-pay | Admitting: Physician Assistant

## 2022-08-15 ENCOUNTER — Other Ambulatory Visit: Payer: Self-pay | Admitting: Nurse Practitioner

## 2022-08-15 DIAGNOSIS — R600 Localized edema: Secondary | ICD-10-CM

## 2022-08-16 ENCOUNTER — Other Ambulatory Visit: Payer: Self-pay | Admitting: Nurse Practitioner

## 2022-08-16 DIAGNOSIS — E10649 Type 1 diabetes mellitus with hypoglycemia without coma: Secondary | ICD-10-CM

## 2022-08-16 DIAGNOSIS — J449 Chronic obstructive pulmonary disease, unspecified: Secondary | ICD-10-CM

## 2022-08-16 DIAGNOSIS — I4819 Other persistent atrial fibrillation: Secondary | ICD-10-CM

## 2022-08-17 ENCOUNTER — Ambulatory Visit: Payer: BC Managed Care – PPO | Admitting: Nurse Practitioner

## 2022-09-07 ENCOUNTER — Ambulatory Visit (INDEPENDENT_AMBULATORY_CARE_PROVIDER_SITE_OTHER): Payer: Medicare HMO | Admitting: Nurse Practitioner

## 2022-09-07 ENCOUNTER — Encounter: Payer: Self-pay | Admitting: Nurse Practitioner

## 2022-09-07 VITALS — BP 120/65 | HR 100 | Temp 98.2°F | Resp 20 | Ht 69.0 in | Wt 181.0 lb

## 2022-09-07 DIAGNOSIS — Z125 Encounter for screening for malignant neoplasm of prostate: Secondary | ICD-10-CM | POA: Diagnosis not present

## 2022-09-07 DIAGNOSIS — J9611 Chronic respiratory failure with hypoxia: Secondary | ICD-10-CM

## 2022-09-07 DIAGNOSIS — Z720 Tobacco use: Secondary | ICD-10-CM | POA: Diagnosis not present

## 2022-09-07 DIAGNOSIS — J4489 Other specified chronic obstructive pulmonary disease: Secondary | ICD-10-CM

## 2022-09-07 DIAGNOSIS — I5042 Chronic combined systolic (congestive) and diastolic (congestive) heart failure: Secondary | ICD-10-CM | POA: Diagnosis not present

## 2022-09-07 DIAGNOSIS — K2100 Gastro-esophageal reflux disease with esophagitis, without bleeding: Secondary | ICD-10-CM

## 2022-09-07 DIAGNOSIS — J449 Chronic obstructive pulmonary disease, unspecified: Secondary | ICD-10-CM

## 2022-09-07 DIAGNOSIS — I4819 Other persistent atrial fibrillation: Secondary | ICD-10-CM

## 2022-09-07 DIAGNOSIS — J439 Emphysema, unspecified: Secondary | ICD-10-CM

## 2022-09-07 DIAGNOSIS — K860 Alcohol-induced chronic pancreatitis: Secondary | ICD-10-CM | POA: Diagnosis not present

## 2022-09-07 DIAGNOSIS — E10649 Type 1 diabetes mellitus with hypoglycemia without coma: Secondary | ICD-10-CM

## 2022-09-07 DIAGNOSIS — N289 Disorder of kidney and ureter, unspecified: Secondary | ICD-10-CM | POA: Diagnosis not present

## 2022-09-07 DIAGNOSIS — I10 Essential (primary) hypertension: Secondary | ICD-10-CM | POA: Diagnosis not present

## 2022-09-07 DIAGNOSIS — I119 Hypertensive heart disease without heart failure: Secondary | ICD-10-CM | POA: Diagnosis not present

## 2022-09-07 LAB — BAYER DCA HB A1C WAIVED: HB A1C (BAYER DCA - WAIVED): 6.4 % — ABNORMAL HIGH (ref 4.8–5.6)

## 2022-09-07 MED ORDER — RIVAROXABAN 20 MG PO TABS: 20.0000 mg | ORAL_TABLET | Freq: Every day | ORAL | 5 refills | Status: DC

## 2022-09-07 MED ORDER — INSULIN GLARGINE 100 UNIT/ML ~~LOC~~ SOLN
SUBCUTANEOUS | 0 refills | Status: DC
Start: 2022-09-07 — End: 2023-02-01

## 2022-09-07 MED ORDER — TRELEGY ELLIPTA 100-62.5-25 MCG/ACT IN AEPB: INHALATION_SPRAY | RESPIRATORY_TRACT | 5 refills | Status: DC

## 2022-09-07 MED ORDER — METOPROLOL SUCCINATE ER 50 MG PO TB24: 75.0000 mg | ORAL_TABLET | Freq: Two times a day (BID) | ORAL | 2 refills | Status: DC

## 2022-09-07 MED ORDER — FUROSEMIDE 40 MG PO TABS: ORAL_TABLET | ORAL | 1 refills | Status: DC

## 2022-09-07 MED ORDER — LOSARTAN POTASSIUM 50 MG PO TABS: 50.0000 mg | ORAL_TABLET | Freq: Every day | ORAL | 1 refills | Status: DC

## 2022-09-07 MED ORDER — FAMOTIDINE 20 MG PO TABS: 20.0000 mg | ORAL_TABLET | Freq: Every morning | ORAL | 1 refills | Status: DC

## 2022-09-07 NOTE — Progress Notes (Signed)
Subjective:    Patient ID: Thomas Ramus., male    DOB: 1955-12-18, 67 y.o.   MRN: [id number]   Chief Complaint: medical management of chronic issues     HPI:  Thomas Timmers. is a 67 y.o. who identifies as a male who was assigned male at birth.   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 Essential hypertension No c/o chest pain or headaches- has chronic SOB. BP Readings from Last 3 Encounters:  07/20/22 106/62  06/24/22 (!) 154/60  04/20/22 (!) 154/63     2. Persistent atrial fibrillation (HCC) 3. Chronic combined systolic and diastolic CHF (congestive heart failure) (HCC) Last cardiology visit was done on 06/24/22. Review of office note showed no change in plan of care.   4. COPD with chronic bronchitis and emphysema (HCC) Is on oxygen now but insists on continuing to smoke. He does take oxygen off now when smoking. Has had inry to face in past when trying to smoke whileon oxygen.  5. Chronic respiratory failure with hypoxia (HCC) Stays SOB without his oxygen  6. Uncontrolled type 1 diabetes mellitus with hypoglycemia, with long-term current use of insulin [facility]) He eats whatever his wife fixes him. He very seldom checks his blood sugars. Lab Results  Component Value Date   HGBA1C 6.4 (H) 02/18/2022  \  7. Alcohol-induced chronic pancreatitis (HCC) Says he is no longer drinking alcohol.  8. Mild renal insufficiency No  voiding issues Lab Results  Component Value Date   CREATININE 1.47 (H) 06/24/2022     9. Tobacco use Still smoking  10. BMI 26.0-26.9 Wt Readings from Last 3 Encounters:  09/07/22 181 lb (82.1 kg)  07/20/22 180 lb (81.6 kg)  06/24/22 180 lb 12.8 oz (82 kg)   BMI Readings from Last 3 Encounters:  09/07/22 26.73 kg/m  07/20/22 26.58 kg/m  06/24/22 26.70 kg/m     New complaints: None  today  Allergies  Allergen Reactions   Avelox [Moxifloxacin Hcl In Nacl] Other (See Comments)    Hemolysis  In 2012   Azithromycin Other (See Comments) and Nausea And Vomiting    "Severe stomach cramps; told to list as an allergy by dr. Lawrence Marseilles ago"   Bactrim [Sulfamethoxazole-Trimethoprim] Diarrhea and Nausea And Vomiting   Outpatient Encounter Medications as of 09/07/2022  Medication Sig   albuterol (VENTOLIN HFA) 108 (90 Base) MCG/ACT inhaler Inhale 2 puffs into the lungs every 6 (six) hours as needed for wheezing or shortness of breath.   cefUROXime (CEFTIN) 250 MG tablet Take 1 tablet (250 mg total) by mouth 2 (two) times daily with a meal.   cetirizine (ZYRTEC) 10 MG tablet Take 10 mg by mouth daily.   dofetilide (TIKOSYN) 250 MCG capsule TAKE 1 CAPSULE BY MOUTH TWICE A DAY   doxycycline (VIBRA-TABS) 100 MG tablet Take 1 tablet (100 mg total) by mouth 2 (two) times daily.   empagliflozin (JARDIANCE) 10 MG TABS tablet Take 1 tablet (10 mg total) by mouth daily before breakfast.   famotidine (PEPCID) 20 MG tablet Take 1 tablet (20 mg total) by mouth every morning. Reported on 04/16/2015   furosemide (LASIX) 40 MG tablet TAKE 1 TABLET BY MOUTH DAILY AS NEEDED (WEIGHT GAIN OF 3 LBS OVERNIGHT OR 5 LBS IN A WEEK).   HYDROcodone-acetaminophen (NORCO) 5-325 MG tablet Take 1 tablet by  mouth 3 (three) times daily as needed for moderate pain or severe pain.   [START ON 09/12/2022] HYDROcodone-acetaminophen (NORCO) 5-325 MG tablet Take 1 tablet by mouth 3 (three) times daily as needed for moderate pain or severe pain.   [START ON 10/12/2022] HYDROcodone-acetaminophen (NORCO) 5-325 MG tablet Take 1 tablet by mouth 3 (three) times daily as needed for moderate pain or severe pain.   insulin glargine (LANTUS) 100 UNIT/ML injection INJECT 0.4 MLS (40 UNITS TOTAL) INTO THE SKIN IN THE MORNING.   Insulin Syringe-Needle U-100 (B-D INS SYR ULTRAFINE 1CC/30G) 30G X 1/2" 1 ML MISC Use 4 times a day with insulin Dx  E11.9   Insulin Syringes, Disposable, U-100 1 ML MISC Use 4 times a day for insulin injection Dx E11.9   ipratropium-albuterol (DUONEB) 0.5-2.5 (3) MG/3ML SOLN TAKE 3 MLS BY NEBULIZATION EVERY 4 (FOUR) HOURS AS NEEDED.   levalbuterol (XOPENEX) 0.31 MG/3ML nebulizer solution Take 3 mLs (0.31 mg total) by nebulization every 4 (four) hours as needed for wheezing.   losartan (COZAAR) 50 MG tablet TAKE 1 TABLET BY MOUTH EVERY DAY   metoprolol succinate (TOPROL-XL) 50 MG 24 hr tablet Take 1.5 tablets (75 mg total) by mouth 2 (two) times daily. Take with or immediately following a meal.   naloxone (NARCAN) nasal spray 4 mg/0.1 mL Place 1 spray into the nose as needed (For opiate overdose).   NOVOLOG 100 UNIT/ML injection INJECT SUBQ PER SLIDING SCALE 190-200=2 UNITS. 300 & ABOVE=7 UNITS   OVER THE COUNTER MEDICATION Take 1-2 tablets by mouth See admin instructions. Super Beets gummies- Chew 1-2 gummies by mouth every day   OXYGEN Inhale 3 L/min into the lungs continuous.   TRELEGY ELLIPTA 100-62.5-25 MCG/ACT AEPB INHALE 1 PUFF INTO THE LUNGS TWICE A DAY   XARELTO 20 MG TABS tablet TAKE 1 TABLET BY MOUTH DAILY WITH SUPPER   No facility-administered encounter medications on file as of 09/07/2022.    Past Surgical History:  Procedure Laterality Date   CARDIOVERSION N/A 01/20/2021   Procedure: CARDIOVERSION;  Surgeon: Pricilla Riffle, MD;  Location: [facility] ENDOSCOPY;  Service: Cardiovascular;  Laterality: N/A;   CYSTOSCOPY WITH URETEROSCOPY Right 08/14/2013   Procedure: CYSTOSCOPY WITH URETEROSCOPY BLADDER BIOPSY ;  Surgeon: Garnett Farm, MD;  Location: [facility];  Service: Urology;  Laterality: Right;   ESOPHAGOGASTRODUODENOSCOPY N/A 02/06/2013   Procedure: ESOPHAGOGASTRODUODENOSCOPY (EGD);  Surgeon: Hart Carwin, MD;  Location: [facility] ENDOSCOPY;  Service: Endoscopy;  Laterality: N/A;   LAPAROSCOPY N/A 11/25/2014   Procedure: LAPAROSCOPIC PRIMARY REPAIR OF PERFORATED PREPYLORIC ULCER WITH Peggye Ley;  Surgeon: Gaynelle Adu, MD;  Location: [facility] OR;  Service: General;  Laterality: N/A;   TONSILLECTOMY  as child   TRANSTHORACIC ECHOCARDIOGRAM  02-17-2011   MODERATE LVH/  EF 65%   TRANSURETHRAL RESECTION OF BLADDER TUMOR WITH GYRUS (TURBT-GYRUS) N/A 06/12/2013   Procedure: TRANSURETHRAL RESECTION OF BLADDER TUMOR WITH GYRUS (TURBT-GYRUS);  Surgeon: Garnett Farm, MD;  Location: [facility];  Service: Urology;  Laterality: N/A;   TYMPANIC MEMBRANE REPAIR  as child    Family History  Problem Relation Age of Onset   Breast cancer Mother    Cancer Mother        Breast   Rheumatic fever Father    Heart disease Father    Heart attack Father        Massive    Diabetes Son       Controlled substance contract: n/a  Review of Systems  Constitutional:  Negative for diaphoresis.  Eyes:  Negative for pain.  Respiratory:  Negative for shortness of breath.   Cardiovascular:  Negative for chest pain, palpitations and leg swelling.  Gastrointestinal:  Negative for abdominal pain.  Endocrine: Negative for polydipsia.  Skin:  Negative for rash.  Neurological:  Negative for dizziness, weakness and headaches.  Hematological:  Does not bruise/bleed easily.  All other systems reviewed and are negative.      Objective:   Physical Exam Vitals and nursing note reviewed.  Constitutional:      Appearance: Normal appearance. He is well-developed.  HENT:     Head: Normocephalic.     Nose: Nose normal.     Mouth/Throat:     Mouth: Mucous membranes are moist.     Pharynx: Oropharynx is clear.  Eyes:     Pupils: Pupils are equal, round, and reactive to light.  Neck:     Thyroid: No thyroid mass or thyromegaly.     Vascular: No carotid bruit or JVD.     Trachea: Phonation normal.  Cardiovascular:     Rate and Rhythm: Normal rate and regular rhythm.  Pulmonary:     Effort: Pulmonary effort is normal. No respiratory distress.     Breath sounds: Normal breath sounds.   Abdominal:     General: Bowel sounds are normal.     Palpations: Abdomen is soft.     Tenderness: There is no abdominal tenderness.  Musculoskeletal:        General: Normal range of motion.     Cervical back: Normal range of motion and neck supple.  Lymphadenopathy:     Cervical: No cervical adenopathy.  Skin:    General: Skin is warm and dry.  Neurological:     Mental Status: He is alert and oriented to person, place, and time.  Psychiatric:        Behavior: Behavior normal.        Thought Content: Thought content normal.        Judgment: Judgment normal.     BP 120/65   Pulse 100   Temp 98.2 F (36.8 C) (Temporal)   Resp 20   Ht 5\' 9"  (1.753 m)   Wt 181 lb (82.1 kg)   SpO2 98% Comment: 3 liters O2  BMI 26.73 kg/m   HGBA1c 6.4%     Assessment & Plan:  Thomas Ramus. comes in today with chief complaint of Medical Management of Chronic Issues   Diagnosis and orders addressed:  1. Essential hypertension Low sodium diet - CBC with Differential/Platelet - CMP14+EGFR - losartan (COZAAR) 50 MG tablet; Take 1 tablet (50 mg total) by mouth daily.  Dispense: 90 tablet; Refill: 1  2. Persistent atrial fibrillation (HCC) Avoid caffeine - rivaroxaban (XARELTO) 20 MG TABS tablet; Take 1 tablet (20 mg total) by mouth daily with supper.  Dispense: 30 tablet; Refill: 5  3. Chronic combined systolic and diastolic CHF (congestive heart failure) (HCC) Continue oxygen  - metoprolol succinate (TOPROL-XL) 50 MG 24 hr tablet; Take 1.5 tablets (75 mg total) by mouth 2 (two) times daily. Take with or immediately following a meal.  Dispense: 270 tablet; Refill: 2 - furosemide (LASIX) 40 MG tablet; TAKE 1 TABLET BY MOUTH DAILY AS NEEDED (WEIGHT GAIN OF 3 LBS OVERNIGHT OR 5 LBS IN A WEEK).  Dispense: 90 tablet; Refill: 1  4. COPD with chronic bronchitis and emphysema (HCC) Stop smoking  5. Chronic respiratory failure with hypoxia (  HCC)  6. Uncontrolled type 1 diabetes  mellitus with hypoglycemia, with long-term current use of insulin (HCC) Continue to watch carbs in diet - Bayer DCA Hb A1c Waived - Lipid panel - insulin glargine (LANTUS) 100 UNIT/ML injection; INJECT 0.4 MLS (40 UNITS TOTAL) INTO THE SKIN IN THE MORNING.  Dispense: 30 mL; Refill: 0  7. Alcohol-induced chronic pancreatitis (HCC) Avoid alcohol  8. Mild renal insufficiency Labs oending  9. Tobacco use Please stop smoking  10. Chronic obstructive pulmonary disease, unspecified (HCC) - Fluticasone-Umeclidin-Vilant (TRELEGY ELLIPTA) 100-62.5-25 MCG/ACT AEPB; INHALE 1 PUFF INTO THE LUNGS TWICE A DAY  Dispense: 60 each; Refill: 5  11. Gastroesophageal reflux disease with esophagitis without hemorrhage Avoid spicy foods Do not eat 2 hours prior to bedtime - famotidine (PEPCID) 20 MG tablet; Take 1 tablet (20 mg total) by mouth every morning. Reported on 04/16/2015  Dispense: 90 tablet; Refill: 1  12. Prostate cancer screening Labs pending - PSA, total and free   Labs pending Health Maintenance reviewed Diet and exercise encouraged  Follow up plan: 6 months   [name] Daphine Deutscher, FNP

## 2022-09-07 NOTE — Patient Instructions (Signed)

## 2022-09-08 LAB — CBC WITH DIFFERENTIAL/PLATELET
Basophils Absolute: 0.1 10*3/uL (ref 0.0–0.2)
Basos: 1 %
EOS (ABSOLUTE): 0.2 10*3/uL (ref 0.0–0.4)
Eos: 2 %
Hematocrit: 37.8 % (ref 37.5–51.0)
Hemoglobin: 12.4 g/dL — ABNORMAL LOW (ref 13.0–17.7)
Immature Grans (Abs): 0 10*3/uL (ref 0.0–0.1)
Immature Granulocytes: 1 %
Lymphocytes Absolute: 1.4 10*3/uL (ref 0.7–3.1)
Lymphs: 18 %
MCH: 30.5 pg (ref 26.6–33.0)
MCHC: 32.8 g/dL (ref 31.5–35.7)
MCV: 93 fL (ref 79–97)
Monocytes Absolute: 0.7 10*3/uL (ref 0.1–0.9)
Monocytes: 9 %
Neutrophils Absolute: 5.5 10*3/uL (ref 1.4–7.0)
Neutrophils: 69 %
Platelets: 236 10*3/uL (ref 150–450)
RBC: 4.07 x10E6/uL — ABNORMAL LOW (ref 4.14–5.80)
RDW: 12.4 % (ref 11.6–15.4)
WBC: 7.9 10*3/uL (ref 3.4–10.8)

## 2022-09-08 LAB — LIPID PANEL
Chol/HDL Ratio: 3 ratio (ref 0.0–5.0)
Cholesterol, Total: 90 mg/dL — ABNORMAL LOW (ref 100–199)
HDL: 30 mg/dL — ABNORMAL LOW (ref >39)
LDL Chol Calc (NIH): 43 mg/dL (ref 0–99)
Triglycerides: 86 mg/dL (ref 0–149)
VLDL Cholesterol Cal: 17 mg/dL (ref 5–40)

## 2022-09-08 LAB — CMP14+EGFR
ALT: 7 IU/L (ref 0–44)
AST: 15 IU/L (ref 0–40)
Albumin: 3.8 g/dL — ABNORMAL LOW (ref 3.9–4.9)
Alkaline Phosphatase: 75 IU/L (ref 44–121)
BUN/Creatinine Ratio: 11 (ref 10–24)
BUN: 14 mg/dL (ref 8–27)
Bilirubin Total: 0.7 mg/dL (ref 0.0–1.2)
CO2: 28 mmol/L (ref 20–29)
Calcium: 9.6 mg/dL (ref 8.6–10.2)
Chloride: 95 mmol/L — ABNORMAL LOW (ref 96–106)
Creatinine, Ser: 1.23 mg/dL (ref 0.76–1.27)
Globulin, Total: 2.7 g/dL (ref 1.5–4.5)
Glucose: 134 mg/dL — ABNORMAL HIGH (ref 70–99)
Potassium: 4.3 mmol/L (ref 3.5–5.2)
Sodium: 135 mmol/L (ref 134–144)
Total Protein: 6.5 g/dL (ref 6.0–8.5)
eGFR: 66 mL/min/{1.73_m2} (ref >59)

## 2022-09-11 LAB — SPECIMEN STATUS REPORT

## 2022-09-11 LAB — PSA, COMPLEXED: PSA, Complexed: 0.3 ng/mL (ref 0.0–3.6)

## 2022-09-15 ENCOUNTER — Telehealth: Payer: Self-pay | Admitting: Nurse Practitioner

## 2022-09-15 DIAGNOSIS — J4489 Other specified chronic obstructive pulmonary disease: Secondary | ICD-10-CM

## 2022-09-16 NOTE — Telephone Encounter (Signed)
Ok to send prescription for nebulizer

## 2022-09-16 NOTE — Telephone Encounter (Signed)
Neb machine sent to [facility] pharmacy per patients wife request. Patients wife notified and verbalized understanding

## 2022-09-19 DIAGNOSIS — J9601 Acute respiratory failure with hypoxia: Secondary | ICD-10-CM | POA: Diagnosis not present

## 2022-09-21 ENCOUNTER — Encounter (HOSPITAL_COMMUNITY): Payer: Self-pay

## 2022-09-22 ENCOUNTER — Telehealth: Payer: Self-pay | Admitting: Cardiology

## 2022-09-22 NOTE — Telephone Encounter (Signed)
Wife will scan new insurance card in patient mychart to get approval for Ct.

## 2022-09-22 NOTE — Telephone Encounter (Signed)
New Message:      Patient said to let Dr Bjorn Pippin know that his test for his CT Pet had to be cancelled for tomorrow(09-23-22). They are waiting on an authjorization from his insurance Administrator). Patient said he was very upset about this.. Patient said he would like for Dr Bjorn Pippin to please give him a call.

## 2022-09-22 NOTE — Telephone Encounter (Signed)
Patient has new insurance and it his uploaded. He will need new authorization for PET CT.  He also states he has questions and only wants to speak to Dr. Bjorn Pippin. He called me an idiot and hung up the phone.

## 2022-09-22 NOTE — Telephone Encounter (Signed)
Wife states they will be changing insurance from [facility] to [facility] effective July 1st and wants to follow-up on getting patient's prior authorization for his upcoming PET scan.  Wife wants to know if she should keep appointment with Dr. Bjorn Pippin on 7/23 are wait until after PET scan.

## 2022-09-23 ENCOUNTER — Encounter (HOSPITAL_COMMUNITY): Payer: Medicare HMO

## 2022-09-24 NOTE — Telephone Encounter (Signed)
Spoke with patient, answered questions about upcoming PET scan

## 2022-09-27 NOTE — Progress Notes (Unsigned)
Cardiology Office Note:    Date:  09/29/2022   ID:  Thomas Sandoval., DOB Nov 17, 1955, MRN [id number]  PCP:  Bennie Pierini, FNP  Cardiologist:  Little Ishikawa, MD  Electrophysiologist:  Lanier Prude, MD   Referring MD: Daphine Deutscher, [name], *   No chief complaint on file.   History of Present Illness:    Thomas Sandoval. is a 67 y.o. male with a hx of alcohol abuse, tobacco abuse, COPD, perforated gastric ulcer status postrepair in 2016, T2DM, hypertension, PAD, MVP, atrial fibrillation/flutter, systolic heart failure who presents for follow-up.  He was hospitalized September 2022 with acute pancreatitis found to be in A. fib.  Echo at that time showed EF 30 to 35%.  He left AMA.  At follow-up with his PCP 12/17/2020 he was still in A. fib and started on Xarelto and diltiazem.  He was referred to A. fib clinic, seen on 12/25/2020.  He underwent cardioversion on 01/20/2021 with successful restoration of sinus rhythm.  Echocardiogram 08/26/2017 showed EF 55 to 60%, mild mitral valve prolapse.  Echo 12/05/2020 showed EF 30 to 35%, global hypokinesis, and low normal RV function, moderate left atrial enlargement, trivial MR.  Zio patch x3 days on 02/10/2021 showed high percent A. fib burden with average rate 95 bpm.  Echocardiogram 05/21/2021 showed EF 55 to 60%, grade 2 diastolic dysfunction, normal RV function, no significant valvular disease.  He was admitted 11/2021 after being found unresponsive with significant hypoglycemia and hypotension.  EKG with aVR elevation and diffuse ST depressions, suggesting global ischemia.  EKG normalized with correction of his hypoglycemia.  He was seen by cardiology and outpatient coronary CT was recommended.  Coronary CTA was attempted but he was in A-fib on presentation and CT was canceled.  He was admitted for Tikosyn loading 02/2022.  Converted to sinus rhythm on Tikosyn.  Since last clinic visit, Reports having dyspnea on  exertion.  No CP.  Reports no light, syncope, palpitations.NO alcohol.Smoking 1ppd.  Taking xarelto, no bleeding.    he reports he is doing okay.  He has Kardia mobile device, shows intermittent A-fib.  Reports continues to have shortness of breath and some lightheadedness but denies any syncope.  Denies any chest pain.  Reports lower extremity edema has improved.  He is taking Xarelto, denies any bleeding issues.  He is smoking 0.5 packs/day.  Reports quit drinking alcohol.  Wt Readings from Last 3 Encounters:  09/29/22 182 lb (82.6 kg)  09/07/22 181 lb (82.1 kg)  07/20/22 180 lb (81.6 kg)    Past Medical History:  Diagnosis Date   Arthritis    Bladder cancer (HCC)    Chronic back pain    Chronic combined systolic and diastolic CHF (congestive heart failure) (HCC) 07/16/2021   COPD (chronic obstructive pulmonary disease) (HCC)    DDD (degenerative disc disease)    Diabetic retinopathy of both eyes (HCC)    Essential hypertension    GERD (gastroesophageal reflux disease)    History of atrial flutter 02/07/2011   Converted to NSR with Cardizem   History of chronic bronchitis    History of hemolytic anemia 02/07/2011   secondary to Avelox   HOH (hard of hearing)    HOH (hard of hearing)    no eardrum and nerve damage on R, also HOH on L   Mild renal insufficiency 08/25/2017   Mitral valve prolapse    a. 2D Echo 11/27/14: EF 55-60%; images were inadequate for LV wall motion assessment, +  mild late systolic mitral valve prolapse involving the anterior leaflet.   PAD (peripheral artery disease) (HCC) 04/09/2014   Dr Myra Gianotti; bilateral SFA occlusion, R mid, L distal   PAF (paroxysmal atrial fibrillation) (HCC)    a. Dx 11/2014 during admission for perf ulcer.   Perforated ulcer (HCC)    a. 11/2014 s/p surgery.   Productive cough    Smokers' cough (HCC)    Type 1 diabetes mellitus (HCC) 03/10/1975    Past Surgical History:  Procedure Laterality Date   CARDIOVERSION N/A 01/20/2021    Procedure: CARDIOVERSION;  Surgeon: Pricilla Riffle, MD;  Location: [facility] ENDOSCOPY;  Service: Cardiovascular;  Laterality: N/A;   CYSTOSCOPY WITH URETEROSCOPY Right 08/14/2013   Procedure: CYSTOSCOPY WITH URETEROSCOPY BLADDER BIOPSY ;  Surgeon: Garnett Farm, MD;  Location: [facility];  Service: Urology;  Laterality: Right;   ESOPHAGOGASTRODUODENOSCOPY N/A 02/06/2013   Procedure: ESOPHAGOGASTRODUODENOSCOPY (EGD);  Surgeon: Hart Carwin, MD;  Location: [facility] ENDOSCOPY;  Service: Endoscopy;  Laterality: N/A;   LAPAROSCOPY N/A 11/25/2014   Procedure: LAPAROSCOPIC PRIMARY REPAIR OF PERFORATED PREPYLORIC ULCER WITH Peggye Ley;  Surgeon: Gaynelle Adu, MD;  Location: [facility];  Service: General;  Laterality: N/A;   TONSILLECTOMY  as child   TRANSTHORACIC ECHOCARDIOGRAM  02-17-2011   MODERATE LVH/  EF 65%   TRANSURETHRAL RESECTION OF BLADDER TUMOR WITH GYRUS (TURBT-GYRUS) N/A 06/12/2013   Procedure: TRANSURETHRAL RESECTION OF BLADDER TUMOR WITH GYRUS (TURBT-GYRUS);  Surgeon: Garnett Farm, MD;  Location: [facility];  Service: Urology;  Laterality: N/A;   TYMPANIC MEMBRANE REPAIR  as child    Current Medications: Current Meds  Medication Sig   albuterol (VENTOLIN HFA) 108 (90 Base) MCG/ACT inhaler Inhale 2 puffs into the lungs every 6 (six) hours as needed for wheezing or shortness of breath.   cetirizine (ZYRTEC) 10 MG tablet Take 10 mg by mouth daily.   dofetilide (TIKOSYN) 250 MCG capsule TAKE 1 CAPSULE BY MOUTH TWICE A DAY   empagliflozin (JARDIANCE) 10 MG TABS tablet Take 1 tablet (10 mg total) by mouth daily before breakfast.   famotidine (PEPCID) 20 MG tablet Take 1 tablet (20 mg total) by mouth every morning. Reported on 04/16/2015   Fluticasone-Umeclidin-Vilant (TRELEGY ELLIPTA) 100-62.5-25 MCG/ACT AEPB INHALE 1 PUFF INTO THE LUNGS TWICE A DAY   furosemide (LASIX) 40 MG tablet TAKE 1 TABLET BY MOUTH DAILY AS NEEDED (WEIGHT GAIN OF 3 LBS OVERNIGHT OR 5 LBS IN A WEEK).    HYDROcodone-acetaminophen (NORCO) 5-325 MG tablet Take 1 tablet by mouth 3 (three) times daily as needed for moderate pain or severe pain.   [START ON 10/12/2022] HYDROcodone-acetaminophen (NORCO) 5-325 MG tablet Take 1 tablet by mouth 3 (three) times daily as needed for moderate pain or severe pain.   insulin glargine (LANTUS) 100 UNIT/ML injection INJECT 0.4 MLS (40 UNITS TOTAL) INTO THE SKIN IN THE MORNING.   Insulin Syringe-Needle U-100 (B-D INS SYR ULTRAFINE 1CC/30G) 30G X 1/2" 1 ML MISC Use 4 times a day with insulin Dx E11.9   Insulin Syringes, Disposable, U-100 1 ML MISC Use 4 times a day for insulin injection Dx E11.9   ipratropium-albuterol (DUONEB) 0.5-2.5 (3) MG/3ML SOLN TAKE 3 MLS BY NEBULIZATION EVERY 4 (FOUR) HOURS AS NEEDED.   levalbuterol (XOPENEX) 0.31 MG/3ML nebulizer solution Take 3 mLs (0.31 mg total) by nebulization every 4 (four) hours as needed for wheezing.   losartan (COZAAR) 50 MG tablet Take 1 tablet (50 mg total) by mouth daily.  metoprolol succinate (TOPROL-XL) 50 MG 24 hr tablet Take 1.5 tablets (75 mg total) by mouth 2 (two) times daily. Take with or immediately following a meal.   naloxone (NARCAN) nasal spray 4 mg/0.1 mL Place 1 spray into the nose as needed (For opiate overdose).   NOVOLOG 100 UNIT/ML injection INJECT SUBQ PER SLIDING SCALE 190-200=2 UNITS. 300 & ABOVE=7 UNITS   OVER THE COUNTER MEDICATION Take 1-2 tablets by mouth See admin instructions. Super Beets gummies- Chew 1-2 gummies by mouth every day   OXYGEN Inhale 3 L/min into the lungs continuous.   rivaroxaban (XARELTO) 20 MG TABS tablet Take 1 tablet (20 mg total) by mouth daily with supper.     Allergies:   Avelox [moxifloxacin hcl in nacl], Azithromycin, and Bactrim [sulfamethoxazole-trimethoprim]   Social History   Socioeconomic History   Marital status: Married    Spouse name: Not on file   Number of children: Not on file   Years of education: Not on file   Highest education level: Not on  file  Occupational History   Occupation: Tobacco Farmer  Tobacco Use   Smoking status: Every Day    Current packs/day: 1.00    Average packs/day: 1 pack/day for 30.0 years (30.0 ttl pk-yrs)    Types: Cigarettes   Smokeless tobacco: Former    Quit date: 06/08/1978   Tobacco comments:    1  pack of cigarettes smoked daily [facility] 02/27/22  Vaping Use   Vaping status: Never Used  Substance and Sexual Activity   Alcohol use: Yes    Comment: 5 quarts per week 12/26/21   Drug use: No   Sexual activity: Not on file  Other Topics Concern   Not on file  Social History Narrative   Lives in [place] with wife and 2 sons.    Social Determinants of Health   Financial Resource Strain: Not on file  Food Insecurity: Patient Declined (11/27/2021)   Hunger Vital Sign    Worried About Running Out of Food in the Last Year: Patient declined    Ran Out of Food in the Last Year: Patient declined  Transportation Needs: Patient Declined (11/27/2021)   PRAPARE - Administrator, Civil Service (Medical): Patient declined    Lack of Transportation (Non-Medical): Patient declined  Physical Activity: Not on file  Stress: Not on file  Social Connections: Not on file     Family History: The patient's family history includes Breast cancer in his mother; Cancer in his mother; Diabetes in his son; Heart attack in his father; Heart disease in his father; Rheumatic fever in his father.  ROS:   Please see the history of present illness.     All other systems reviewed and are negative.  EKGs/Labs/Other Studies Reviewed:    The following studies were reviewed today:   EKG:   09/29/22: AFL, rate 87, Qtc 459 06/24/22: Normal sinus rhythm, rate 60, QTc 452 02/04/22: Atrial fibrillation, rate 104 11/19/2021: Normal sinus rhythm, rate 72, left axis deviation 08/20/2021: Atrial flutter with variable conduction, rate 87 05/20/21: Normal sinus rhythm, rate 61, left axis deviation, nonspecific T wave  flattening, Q waves in leads III, aVF 03/14/21:NSR, ST depression<1 mm in inferior leads and V4-6, rate 61  Recent Labs: 02/11/2022: B Natriuretic Peptide 886.2 06/24/2022: Magnesium 2.0 09/07/2022: ALT 7; BUN 14; Creatinine, Ser 1.23; Hemoglobin 12.4; Platelets 236; Potassium 4.3; Sodium 135  Recent Lipid Panel    Component Value Date/Time   CHOL 90 (L) 09/07/2022 1543  TRIG 86 09/07/2022 1543   HDL 30 (L) 09/07/2022 1543   CHOLHDL 3.0 09/07/2022 1543   LDLCALC 43 09/07/2022 1543    Physical Exam:    VS:  BP 121/71 (BP Location: Right Arm, Patient Position: Sitting, Cuff Size: Normal)   Pulse 87   Ht 5\' 8"  (1.727 m)   Wt 182 lb (82.6 kg)   SpO2 95%   BMI 27.67 kg/m     Wt Readings from Last 3 Encounters:  09/29/22 182 lb (82.6 kg)  09/07/22 181 lb (82.1 kg)  07/20/22 180 lb (81.6 kg)     GEN:  Well nourished, well developed in no acute distress HEENT: Normal NECK: No JVD; No carotid bruits CARDIAC: RRR, no murmurs, rubs, gallops RESPIRATORY:  Clear to auscultation without rales, wheezing or rhonchi  ABDOMEN: Soft, non-tender, non-distended MUSCULOSKELETAL: 1+ edema; No deformity  SKIN: Warm and dry NEUROLOGIC:  Alert and oriented x 3 PSYCHIATRIC:  Normal affect   ASSESSMENT:    1. Persistent atrial fibrillation (HCC)   2. Chronic combined systolic and diastolic heart failure (HCC)   3. Coronary artery disease involving native coronary artery of native heart, unspecified whether angina present   4. Tobacco use   5. Essential hypertension       PLAN:    Persistent atrial fibrillation/flutter: CHA2DS2-VASc score 4.  Underwent successful cardioversion on 01/20/2021, but was back in A. fib at follow-up appointment on 01/24/2021.  Zio patch x3 days on 02/10/2021 showed 100% percent A. fib burden with average rate 95 bpm.   -Continue Xarelto 20 mg daily -Continue Toprol-XL 75 mg twice daily -Given possible tachycardia induced cardiomyopathy, would recommend rhythm  control strategy.  Referred to EP to consider ablation versus antiarrhythmic.  Seen by Dr Lalla Brothers.  Admitted 02/2022 for Tikosyn loading.  Continue Tikosyn 250 mcg twice daily.  Qtc 459 today.  He is in atrial flutter in clinic today.  Review of his Kardia mobile tracings shows he appeared to be in sinus rhythm 2 days ago.  May be going in and out of A-fib/flutter, recommend follow-up in A-fib clinic in 2 weeks and would consider cardioversion if remains in flutter  Chronic combined systolic and diastolic heart failure: EF 30 to 35% on echocardiogram during admission with acute pancreatitis in September 2022.  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  Echocardiogram 05/21/2021 showed EF 55 to 60% -Continue Lasix 20 mg daily (reports he has been taking 40 mg on MWF).  Check BMET, magnesium -Continue Toprol-XL 75 mg BID -Continue losartan 50 mg daily -Continue Jardiance 10 mg daily  CAD: He was admitted 11/2021 after being found unresponsive with significant hypoglycemia and hypotension.  EKG with aVR elevation and diffuse ST depressions, suggesting global ischemia.  EKG normalized with correction of his hypoglycemia.  He was seen by cardiology and outpatient coronary CT was recommended.  He denies any chest pain but having dyspnea on exertion -Recommend coronary CTA to evaluate for obstructive CAD.  Coronary CTA was attempted but he was in A-fib on presentation and was canceled.  Has had CT chest however which shows severe coronary calcifications.  Given severe coronary calcifications, would recommend stress PET instead of coronary CTA.  Stress PET scheduled for 10/07/2022  Alcohol abuse: He reports he stopped drinking after starting Tikosyn  Tobacco use: Patient counseled on the risk of tobacco use and cessation strongly encouraged.  Hypertension: Continue Toprol-XL, losartan  T2DM: On insulin  PAD: ABIs 2019 are severely reduced (right 0.52, left 0.48).  Lower extremity duplex  04/2014 showed  bilateral SFA occlusions.  ABIs 02/2022 were 0.36 on right, 0.45 on left.  Follows with vascular surgery   RTC in 3 months  Shared Decision Making/Informed Consent The risks [chest pain, shortness of breath, cardiac arrhythmias, dizziness, blood pressure fluctuations, myocardial infarction, stroke/transient ischemic attack, nausea, vomiting, allergic reaction, radiation exposure, metallic taste sensation and life-threatening complications (estimated to be 1 in 10,000)], benefits (risk stratification, diagnosing coronary artery disease, treatment guidance) and alternatives of a cardiac PET stress test were discussed in detail with Mr. Fontan and he agrees to proceed.    Medication Adjustments/Labs and Tests Ordered: Current medicines are reviewed at length with the patient today.  Concerns regarding medicines are outlined above.  Orders Placed This Encounter  Procedures   Basic metabolic panel   Magnesium   CBC w/Diff/Platelet   EKG 12-Lead    No orders of the defined types were placed in this encounter.   Patient Instructions  Medication Instructions:  Continue same medications *If you need a refill on your cardiac medications before your next appointment, please call your pharmacy*   Lab Work: Bmet,magnesium,cbc today   Testing/Procedures: None ordered   Follow-Up: At [facility], you and your health needs are our priority.  As part of our continuing mission to provide you with exceptional heart care, we have created designated Provider Care Teams.  These Care Teams include your primary Cardiologist (physician) and Advanced Practice Providers (APPs -  Physician Assistants and Nurse Practitioners) who all work together to provide you with the care you need, when you need it.  We recommend signing up for the patient portal called "MyChart".  Sign up information is provided on this After Visit Summary.  MyChart is used to connect with patients for Virtual Visits  (Telemedicine).  Patients are able to view lab/test results, encounter notes, upcoming appointments, etc.  Non-urgent messages can be sent to your provider as well.   To learn more about what you can do with MyChart, go to [email or web address].    Your next appointment:  3 months    Provider:  Dr.Karlina Suares    Schedule appointment with Afib Clinic in 2 weeks    Signed, Little Ishikawa, MD  09/29/2022 3:06 PM    [facility]

## 2022-09-29 ENCOUNTER — Encounter: Payer: Self-pay | Admitting: Cardiology

## 2022-09-29 ENCOUNTER — Ambulatory Visit: Payer: Medicare HMO | Attending: Cardiology | Admitting: Cardiology

## 2022-09-29 VITALS — BP 121/71 | HR 87 | Ht 68.0 in | Wt 182.0 lb

## 2022-09-29 DIAGNOSIS — I5042 Chronic combined systolic (congestive) and diastolic (congestive) heart failure: Secondary | ICD-10-CM | POA: Diagnosis not present

## 2022-09-29 DIAGNOSIS — I4819 Other persistent atrial fibrillation: Secondary | ICD-10-CM

## 2022-09-29 DIAGNOSIS — I10 Essential (primary) hypertension: Secondary | ICD-10-CM | POA: Diagnosis not present

## 2022-09-29 DIAGNOSIS — Z720 Tobacco use: Secondary | ICD-10-CM | POA: Diagnosis not present

## 2022-09-29 DIAGNOSIS — I2510 Atherosclerotic heart disease of native coronary artery without angina pectoris: Secondary | ICD-10-CM

## 2022-09-29 LAB — BASIC METABOLIC PANEL

## 2022-09-29 LAB — CBC WITH DIFFERENTIAL/PLATELET
EOS (ABSOLUTE): 0.2 10*3/uL (ref 0.0–0.4)
Hematocrit: 39.3 % (ref 37.5–51.0)
Hemoglobin: 13.4 g/dL (ref 13.0–17.7)
Immature Grans (Abs): 0 10*3/uL (ref 0.0–0.1)
Lymphs: 20 %
MCH: 32 pg (ref 26.6–33.0)
Monocytes: 11 %
Neutrophils Absolute: 5.6 10*3/uL (ref 1.4–7.0)
RBC: 4.19 x10E6/uL (ref 4.14–5.80)
WBC: 8.5 10*3/uL (ref 3.4–10.8)

## 2022-09-29 NOTE — Patient Instructions (Signed)
Medication Instructions:  Continue same medications *If you need a refill on your cardiac medications before your next appointment, please call your pharmacy*   Lab Work: Bmet,magnesium,cbc today   Testing/Procedures: None ordered   Follow-Up: At [facility], you and your health needs are our priority.  As part of our continuing mission to provide you with exceptional heart care, we have created designated Provider Care Teams.  These Care Teams include your primary Cardiologist (physician) and Advanced Practice Providers (APPs -  Physician Assistants and Nurse Practitioners) who all work together to provide you with the care you need, when you need it.  We recommend signing up for the patient portal called "MyChart".  Sign up information is provided on this After Visit Summary.  MyChart is used to connect with patients for Virtual Visits (Telemedicine).  Patients are able to view lab/test results, encounter notes, upcoming appointments, etc.  Non-urgent messages can be sent to your provider as well.   To learn more about what you can do with MyChart, go to [email or web address].    Your next appointment:  3 months    Provider:  Dr.Schumann    Schedule appointment with Afib Clinic in 2 weeks

## 2022-09-30 LAB — CBC WITH DIFFERENTIAL/PLATELET
Basophils Absolute: 0.1 10*3/uL (ref 0.0–0.2)
Basos: 1 %
Eos: 2 %
Immature Granulocytes: 1 %
Lymphocytes Absolute: 1.7 10*3/uL (ref 0.7–3.1)
MCHC: 34.1 g/dL (ref 31.5–35.7)
MCV: 94 fL (ref 79–97)
Monocytes Absolute: 0.9 10*3/uL (ref 0.1–0.9)
Neutrophils: 65 %
Platelets: 245 10*3/uL (ref 150–450)
RDW: 12.7 % (ref 11.6–15.4)

## 2022-09-30 LAB — BASIC METABOLIC PANEL
BUN/Creatinine Ratio: 13 (ref 10–24)
CO2: 27 mmol/L (ref 20–29)
Calcium: 9.9 mg/dL (ref 8.6–10.2)
Chloride: 95 mmol/L — ABNORMAL LOW (ref 96–106)
Potassium: 4.4 mmol/L (ref 3.5–5.2)
Sodium: 137 mmol/L (ref 134–144)

## 2022-09-30 LAB — MAGNESIUM: Magnesium: 2 mg/dL (ref 1.6–2.3)

## 2022-10-03 ENCOUNTER — Other Ambulatory Visit: Payer: Self-pay | Admitting: Emergency Medicine

## 2022-10-03 DIAGNOSIS — J189 Pneumonia, unspecified organism: Secondary | ICD-10-CM

## 2022-10-03 DIAGNOSIS — R946 Abnormal results of thyroid function studies: Secondary | ICD-10-CM

## 2022-10-03 DIAGNOSIS — J449 Chronic obstructive pulmonary disease, unspecified: Secondary | ICD-10-CM

## 2022-10-06 ENCOUNTER — Telehealth (HOSPITAL_COMMUNITY): Payer: Self-pay | Admitting: *Deleted

## 2022-10-06 NOTE — Telephone Encounter (Signed)
Reaching out to patient to offer assistance regarding upcoming cardiac imaging study; pt handed phone to his wife and she verbalizes understanding of appt date/time, parking situation and where to check in, pre-test NPO status, and verified current allergies; name and call back number provided for further questions should they arise  Larey Brick RN Navigator Cardiac Imaging [facility] [phone] office [phone] cell  Patient aware to avoid caffeine 12 hours prior to his cardiac PET scan.

## 2022-10-07 ENCOUNTER — Ambulatory Visit (HOSPITAL_COMMUNITY)
Admission: RE | Admit: 2022-10-07 | Discharge: 2022-10-07 | Disposition: A | Discharge status: Home / Self Care | Payer: Medicare HMO | Source: Ambulatory Visit | Attending: Cardiology | Admitting: Cardiology

## 2022-10-07 DIAGNOSIS — R0602 Shortness of breath: Secondary | ICD-10-CM

## 2022-10-07 LAB — NM PET CT CARDIAC PERFUSION MULTI W/ABSOLUTE BLOODFLOW
LV dias vol: 81 mL (ref 62–150)
LV sys vol: 32 mL
MBFR: 1.78
Nuc Rest EF: 81 %
Nuc Stress EF: 32 %
Rest MBF: 0.9 ml/g/min
Rest Nuclear Isotope Dose: 21.6 mCi
Stress MBF: 1.6 ml/g/min
Stress Nuclear Isotope Dose: 21.2 mCi

## 2022-10-07 MED ORDER — REGADENOSON 0.4 MG/5ML IV SOLN
INTRAVENOUS | Status: AC
  Filled 2022-10-07: qty 5

## 2022-10-07 MED ORDER — RUBIDIUM RB82 GENERATOR (RUBYFILL)
21.5800 | PACK | Freq: Once | INTRAVENOUS | Status: AC
  Administered 2022-10-07: 21.58 via INTRAVENOUS

## 2022-10-07 MED ORDER — REGADENOSON 0.4 MG/5ML IV SOLN
0.4000 mg | Freq: Once | INTRAVENOUS | Status: AC
  Administered 2022-10-07: 0.4 mg via INTRAVENOUS

## 2022-10-07 MED ORDER — RUBIDIUM RB82 GENERATOR (RUBYFILL)
21.2200 | PACK | Freq: Once | INTRAVENOUS | Status: AC
  Administered 2022-10-07: 21.22 via INTRAVENOUS

## 2022-10-13 ENCOUNTER — Telehealth: Payer: Self-pay

## 2022-10-13 ENCOUNTER — Other Ambulatory Visit (HOSPITAL_COMMUNITY): Payer: Self-pay

## 2022-10-13 MED ORDER — HYDROCODONE-ACETAMINOPHEN 5-325 MG PO TABS
1.0000 | ORAL_TABLET | Freq: Three times a day (TID) | ORAL | 0 refills | Status: DC | PRN
  Filled 2022-10-13: qty 90, 30d supply, fill #0

## 2022-10-13 NOTE — Telephone Encounter (Signed)
Patient wife called states that [facility] does not have Hydrocodone/APAP but [employer], please send Rx. Prescription has been cancelled at [facility].

## 2022-10-13 NOTE — Telephone Encounter (Signed)
Thomas Sandoval called again and is needing his medication (hydrocodone ) sent to [facility] and they close at 6pm.

## 2022-10-13 NOTE — Telephone Encounter (Signed)
PMP was Reviewed.  Hydrocodone e-scribed today.  Sybil RN will notify Mr. Bissey. He has a scheduled appointment with Dr Shearon Stalls this month.

## 2022-10-14 ENCOUNTER — Other Ambulatory Visit: Payer: Self-pay

## 2022-10-14 DIAGNOSIS — R0602 Shortness of breath: Secondary | ICD-10-CM

## 2022-10-14 DIAGNOSIS — J984 Other disorders of lung: Secondary | ICD-10-CM

## 2022-10-14 NOTE — Telephone Encounter (Signed)
Patient wife stated the Rx will be filled. She has to go pick it up.

## 2022-10-15 ENCOUNTER — Ambulatory Visit (HOSPITAL_COMMUNITY): Payer: Medicare HMO | Admitting: Physician Assistant

## 2022-10-20 ENCOUNTER — Ambulatory Visit: Payer: BC Managed Care – PPO | Admitting: Registered Nurse

## 2022-10-20 DIAGNOSIS — J9601 Acute respiratory failure with hypoxia: Secondary | ICD-10-CM | POA: Diagnosis not present

## 2022-11-02 ENCOUNTER — Encounter: Payer: Medicare HMO | Attending: Registered Nurse | Admitting: Physical Medicine and Rehabilitation

## 2022-11-02 ENCOUNTER — Encounter: Payer: Self-pay | Admitting: Physical Medicine and Rehabilitation

## 2022-11-02 VITALS — BP 129/68 | HR 66 | Ht 68.0 in | Wt 181.2 lb

## 2022-11-02 DIAGNOSIS — M549 Dorsalgia, unspecified: Secondary | ICD-10-CM

## 2022-11-02 DIAGNOSIS — G894 Chronic pain syndrome: Secondary | ICD-10-CM | POA: Diagnosis not present

## 2022-11-02 DIAGNOSIS — M47816 Spondylosis without myelopathy or radiculopathy, lumbar region: Secondary | ICD-10-CM | POA: Diagnosis not present

## 2022-11-02 DIAGNOSIS — G8929 Other chronic pain: Secondary | ICD-10-CM

## 2022-11-02 NOTE — Progress Notes (Signed)
Subjective:    Patient ID: Thomas Ramus., male    DOB: 21-Mar-1955, 67 y.o.   MRN: [id number]  HPI  Thomas Sandoval. is a 67 y.o. year old male  who  has a past medical history of Arthritis, Bladder cancer (HCC), Chronic back pain, Chronic combined systolic and diastolic CHF (congestive heart failure) (HCC) (07/16/2021), COPD (chronic obstructive pulmonary disease) (HCC), DDD (degenerative disc disease), Diabetic retinopathy of both eyes (HCC), Essential hypertension, GERD (gastroesophageal reflux disease), History of atrial flutter (02/07/2011), History of chronic bronchitis, History of hemolytic anemia (02/07/2011), HOH (hard of hearing), HOH (hard of hearing), Mild renal insufficiency (08/25/2017), Mitral valve prolapse, PAD (peripheral artery disease) (HCC) (04/09/2014), PAF (paroxysmal atrial fibrillation) (HCC), Perforated ulcer (HCC), Productive cough, Smokers' cough (HCC), and Type 1 diabetes mellitus (HCC) (03/10/1975).   They are presenting to PM&R clinic for follow up related to chronic progressive low back pain .  Plan from last visit:  Chronic pain syndrome -     Drug Tox Monitor 1 w/Conf, Oral Fld -     Drug Tox Alc Metab w/Con, Oral Fld   For next pain medication, I will be trying to convert you to weekly buprenorphine patches.  You will start these when due for your next pain medication.  I will place instructions for any adjunctive pain medications while on the patch on your prescription bottle.  If after 1 week, your pain is worse or intolerable, call the clinic and bring your patches into dispose and we will represcribe your Norco 5 mg 3 times daily.   I will give you enough medication for 3 months.  Follow-up with myself or Riley Lam, our nurse practitioner, in 3 months.   As reminder, please keep your blood sugars above 70 to prevent the sedating effect of low blood sugars.  Continue to abstain from alcohol.    Chronic bilateral low back pain without  sciatica Maintain as much activity as possible; if you want, I am happy to write a prescription to send you back to physical therapy to help keep you active.   Chronic SI joint pain Once you are on Medicare, follow-up with Dr. Wynn Banker for SI joint ablation to help with your back pain.   Interval Hx:  - Therapies: "I'm venturing out more". Does endorse pain is getting worse in the lower part of his back.    - Follow ups: Missed a follow up with Dr. Wynn Banker to discuss SI joint injection/ablation.    - Falls: None since last visit.    - NWG:NFAOZHY with a cane attances to his oxygen concentrator.    - Medications: Had side effects to byutrans patch with vivid dreams; stopped and went back to Norco.   Inquiring about medical marijuana.    - Other concerns: asking for physicians note to not wear his seatbelt.   Pain Inventory Average Pain 7 Pain Right Now 6 My pain is sharp, burning, and dull  In the last 24 hours, has pain interfered with the following? General activity 5 Relation with others 0 Enjoyment of life 0 What TIME of day is your pain at its worst? morning , daytime, evening, and night Sleep (in general) Fair  Pain is worse with: walking, bending, and some activites Pain improves with: rest and medication Relief from Meds: 5  Family History  Problem Relation Age of Onset   Breast cancer Mother    Cancer Mother        Breast   Rheumatic  fever Father    Heart disease Father    Heart attack Father        Massive    Diabetes Son    Social History   Socioeconomic History   Marital status: Married    Spouse name: Not on file   Number of children: Not on file   Years of education: Not on file   Highest education level: Not on file  Occupational History   Occupation: Tobacco Farmer  Tobacco Use   Smoking status: Every Day    Current packs/day: 1.00    Average packs/day: 1 pack/day for 30.0 years (30.0 ttl pk-yrs)    Types: Cigarettes   Smokeless  tobacco: Former    Quit date: 06/08/1978   Tobacco comments:    1  pack of cigarettes smoked daily [facility] 02/27/22  Vaping Use   Vaping status: Never Used  Substance and Sexual Activity   Alcohol use: Yes    Comment: 5 quarts per week 12/26/21   Drug use: No   Sexual activity: Not on file  Other Topics Concern   Not on file  Social History Narrative   Lives in [place] with wife and 2 sons.    Social Determinants of Health   Financial Resource Strain: Not on file  Food Insecurity: Patient Declined (11/27/2021)   Hunger Vital Sign    Worried About Running Out of Food in the Last Year: Patient declined    Ran Out of Food in the Last Year: Patient declined  Transportation Needs: Patient Declined (11/27/2021)   PRAPARE - Administrator, Civil Service (Medical): Patient declined    Lack of Transportation (Non-Medical): Patient declined  Physical Activity: Not on file  Stress: Not on file  Social Connections: Not on file   Past Surgical History:  Procedure Laterality Date   CARDIOVERSION N/A 01/20/2021   Procedure: CARDIOVERSION;  Surgeon: Pricilla Riffle, MD;  Location: [facility] ENDOSCOPY;  Service: Cardiovascular;  Laterality: N/A;   CYSTOSCOPY WITH URETEROSCOPY Right 08/14/2013   Procedure: CYSTOSCOPY WITH URETEROSCOPY BLADDER BIOPSY ;  Surgeon: Garnett Farm, MD;  Location: [facility];  Service: Urology;  Laterality: Right;   ESOPHAGOGASTRODUODENOSCOPY N/A 02/06/2013   Procedure: ESOPHAGOGASTRODUODENOSCOPY (EGD);  Surgeon: Hart Carwin, MD;  Location: [facility] ENDOSCOPY;  Service: Endoscopy;  Laterality: N/A;   LAPAROSCOPY N/A 11/25/2014   Procedure: LAPAROSCOPIC PRIMARY REPAIR OF PERFORATED PREPYLORIC ULCER WITH Peggye Ley;  Surgeon: Gaynelle Adu, MD;  Location: [facility] OR;  Service: General;  Laterality: N/A;   TONSILLECTOMY  as child   TRANSTHORACIC ECHOCARDIOGRAM  02-17-2011   MODERATE LVH/  EF 65%   TRANSURETHRAL RESECTION OF BLADDER TUMOR WITH GYRUS (TURBT-GYRUS) N/A  06/12/2013   Procedure: TRANSURETHRAL RESECTION OF BLADDER TUMOR WITH GYRUS (TURBT-GYRUS);  Surgeon: Garnett Farm, MD;  Location: [facility];  Service: Urology;  Laterality: N/A;   TYMPANIC MEMBRANE REPAIR  as child   Past Surgical History:  Procedure Laterality Date   CARDIOVERSION N/A 01/20/2021   Procedure: CARDIOVERSION;  Surgeon: Pricilla Riffle, MD;  Location: [facility] ENDOSCOPY;  Service: Cardiovascular;  Laterality: N/A;   CYSTOSCOPY WITH URETEROSCOPY Right 08/14/2013   Procedure: CYSTOSCOPY WITH URETEROSCOPY BLADDER BIOPSY ;  Surgeon: Garnett Farm, MD;  Location: [facility];  Service: Urology;  Laterality: Right;   ESOPHAGOGASTRODUODENOSCOPY N/A 02/06/2013   Procedure: ESOPHAGOGASTRODUODENOSCOPY (EGD);  Surgeon: Hart Carwin, MD;  Location: [facility] ENDOSCOPY;  Service: Endoscopy;  Laterality: N/A;   LAPAROSCOPY N/A 11/25/2014  Procedure: LAPAROSCOPIC PRIMARY REPAIR OF PERFORATED PREPYLORIC ULCER WITH Peggye Ley;  Surgeon: Gaynelle Adu, MD;  Location: [facility] OR;  Service: General;  Laterality: N/A;   TONSILLECTOMY  as child   TRANSTHORACIC ECHOCARDIOGRAM  02-17-2011   MODERATE LVH/  EF 65%   TRANSURETHRAL RESECTION OF BLADDER TUMOR WITH GYRUS (TURBT-GYRUS) N/A 06/12/2013   Procedure: TRANSURETHRAL RESECTION OF BLADDER TUMOR WITH GYRUS (TURBT-GYRUS);  Surgeon: Garnett Farm, MD;  Location: [facility];  Service: Urology;  Laterality: N/A;   TYMPANIC MEMBRANE REPAIR  as child   Past Medical History:  Diagnosis Date   Arthritis    Bladder cancer (HCC)    Chronic back pain    Chronic combined systolic and diastolic CHF (congestive heart failure) (HCC) 07/16/2021   COPD (chronic obstructive pulmonary disease) (HCC)    DDD (degenerative disc disease)    Diabetic retinopathy of both eyes (HCC)    Essential hypertension    GERD (gastroesophageal reflux disease)    History of atrial flutter 02/07/2011   Converted to NSR with Cardizem   History of chronic  bronchitis    History of hemolytic anemia 02/07/2011   secondary to Avelox   HOH (hard of hearing)    HOH (hard of hearing)    no eardrum and nerve damage on R, also HOH on L   Mild renal insufficiency 08/25/2017   Mitral valve prolapse    a. 2D Echo 11/27/14: EF 55-60%; images were inadequate for LV wall motion assessment, + mild late systolic mitral valve prolapse involving the anterior leaflet.   PAD (peripheral artery disease) (HCC) 04/09/2014   Dr Myra Gianotti; bilateral SFA occlusion, R mid, L distal   PAF (paroxysmal atrial fibrillation) (HCC)    a. Dx 11/2014 during admission for perf ulcer.   Perforated ulcer (HCC)    a. 11/2014 s/p surgery.   Productive cough    Smokers' cough (HCC)    Type 1 diabetes mellitus (HCC) 03/10/1975   BP 129/68   Pulse 66   Ht 5\' 8"  (1.727 m)   Wt 181 lb 3.2 oz (82.2 kg)   SpO2 98%   BMI 27.55 kg/m   Opioid Risk Score:   Fall Risk Score:  `1  Depression screen [facility] 2/9     11/02/2022    2:33 PM 09/07/2022    3:27 PM 07/20/2022    1:26 PM 04/20/2022    2:52 PM 03/12/2022    2:54 PM 02/10/2022   10:29 AM 02/02/2022    2:35 PM  Depression screen PHQ 2/9  Decreased Interest 0 0 0 0 0 0 0  Down, Depressed, Hopeless 0 0 0 0 0 0 0  PHQ - 2 Score 0 0 0 0 0 0 0  Altered sleeping  0     0  Tired, decreased energy  0     0  Change in appetite  0     0  Feeling bad or failure about yourself   0     0  Trouble concentrating  0     0  Moving slowly or fidgety/restless  0     0  Suicidal thoughts  0     0  PHQ-9 Score  0     0  Difficult doing work/chores  Not difficult at all     Not difficult at all     Review of Systems  Constitutional: Negative.   HENT: Negative.    Eyes: Negative.   Respiratory:  Positive for  shortness of breath.        O2 3L [place]  Cardiovascular: Negative.   Gastrointestinal: Negative.   Endocrine: Negative.   Genitourinary: Negative.   Musculoskeletal:  Positive for back pain.  Skin: Negative.   Allergic/Immunologic:  Negative.   Neurological: Negative.   Hematological: Negative.   Psychiatric/Behavioral: Negative.    All other systems reviewed and are negative.      Objective:   Physical Exam   PE: Constitution: Appropriate appearance for age. No apparent distress . +underweight Resp: No respiratory distress. No accessory muscle usage. CTAB and on 2 L [place] Cardio: Well perfused appearance. No peripheral edema. Abdomen: Nondistended. Nontender.   Psych: Appropriate mood and affect. Neuro: AAOx4. No apparent cognitive deficits . +HOH with bilateral hearing aides.   MSK:  + FABER, FADIR R>L + B/l Yeoman's + B/l Gillet's Negative bilateral compression + TTP low back bilateral, SI joints, and PSISs   Facet loading: + Mildly increased LBP, bilateral   Forward bending: + Mildly increased bilateral LBP    Gait: Forward leaning, antalgic     Assessment & Plan:   Thomas Digges. is a 67 y.o. year old male  who  has a past medical history of Arthritis, Bladder cancer (HCC), Chronic back pain, Chronic combined systolic and diastolic CHF (congestive heart failure) (HCC) (07/16/2021), COPD (chronic obstructive pulmonary disease) (HCC), DDD (degenerative disc disease), Diabetic retinopathy of both eyes (HCC), Essential hypertension, GERD (gastroesophageal reflux disease), History of atrial flutter (02/07/2011), History of chronic bronchitis, History of hemolytic anemia (02/07/2011), HOH (hard of hearing), HOH (hard of hearing), Mild renal insufficiency (08/25/2017), Mitral valve prolapse, PAD (peripheral artery disease) (HCC) (04/09/2014), PAF (paroxysmal atrial fibrillation) (HCC), Perforated ulcer (HCC), Productive cough, Smokers' cough (HCC), and Type 1 diabetes mellitus (HCC) (03/10/1975).   They are presenting to PM&R clinic for follow up related to chronic progressive low back pain .  Chronic pain syndrome Chronic bilateral back pain, unspecified back location Indication for chronic opioid:  M54.9 Medication and dose: Norco 5-325 mg TID PRN - Send to [facility] # pills per month: 90 Last UDS date: 02/10/22 Opioid Treatment Agreement signed (Y/N): Y Opioid Treatment Agreement last reviewed with patient:  11/17/21 NCCSRS reviewed this encounter (include red flags):  Y   Medium Risk (10-90 MME) UDS every 3-6 months NCCSR check every visit Q3M follow up  Attempted transition to Butrans patch last visit, sidfe effect of strange dreams. Stable on current regimen, continue.    Follow up in 3 months  Spondylosis without myelopathy or radiculopathy, lumbar region Reschedule appointment with Dr Wynn Banker

## 2022-11-02 NOTE — Patient Instructions (Signed)
I will send narcotic scripts to [facility] pharmacy  Reschedule appointment with Dr Wynn Banker  Follow up in 3 months

## 2022-11-04 ENCOUNTER — Ambulatory Visit (HOSPITAL_BASED_OUTPATIENT_CLINIC_OR_DEPARTMENT_OTHER)
Admission: RE | Admit: 2022-11-04 | Discharge: 2022-11-04 | Disposition: A | Discharge status: Home / Self Care | Payer: Medicare HMO | Source: Ambulatory Visit | Attending: Cardiology | Admitting: Cardiology

## 2022-11-04 DIAGNOSIS — R0602 Shortness of breath: Secondary | ICD-10-CM | POA: Insufficient documentation

## 2022-11-04 DIAGNOSIS — R918 Other nonspecific abnormal finding of lung field: Secondary | ICD-10-CM | POA: Diagnosis not present

## 2022-11-04 DIAGNOSIS — J984 Other disorders of lung: Secondary | ICD-10-CM | POA: Insufficient documentation

## 2022-11-04 DIAGNOSIS — J479 Bronchiectasis, uncomplicated: Secondary | ICD-10-CM | POA: Diagnosis not present

## 2022-11-04 DIAGNOSIS — R0609 Other forms of dyspnea: Secondary | ICD-10-CM | POA: Diagnosis not present

## 2022-11-07 MED ORDER — HYDROCODONE-ACETAMINOPHEN 5-325 MG PO TABS
1.0000 | ORAL_TABLET | Freq: Three times a day (TID) | ORAL | 0 refills | Status: DC | PRN
  Filled 2023-01-12: qty 90, 30d supply, fill #0

## 2022-11-07 MED ORDER — HYDROCODONE-ACETAMINOPHEN 5-325 MG PO TABS
1.0000 | ORAL_TABLET | Freq: Three times a day (TID) | ORAL | 0 refills | Status: AC | PRN
  Filled 2022-11-12: qty 90, 30d supply, fill #0

## 2022-11-07 MED ORDER — HYDROCODONE-ACETAMINOPHEN 5-325 MG PO TABS
1.0000 | ORAL_TABLET | Freq: Three times a day (TID) | ORAL | 0 refills | Status: AC | PRN
  Filled 2022-12-12: qty 90, 30d supply, fill #0

## 2022-11-08 ENCOUNTER — Other Ambulatory Visit: Payer: Self-pay | Admitting: Nurse Practitioner

## 2022-11-08 DIAGNOSIS — E10649 Type 1 diabetes mellitus with hypoglycemia without coma: Secondary | ICD-10-CM

## 2022-11-10 ENCOUNTER — Other Ambulatory Visit (HOSPITAL_COMMUNITY): Payer: Self-pay

## 2022-11-10 ENCOUNTER — Ambulatory Visit (HOSPITAL_COMMUNITY): Payer: Medicare HMO | Admitting: Physician Assistant

## 2022-11-10 DIAGNOSIS — H6123 Impacted cerumen, bilateral: Secondary | ICD-10-CM | POA: Diagnosis not present

## 2022-11-11 ENCOUNTER — Telehealth: Payer: Self-pay | Admitting: Emergency Medicine

## 2022-11-11 ENCOUNTER — Telehealth: Payer: Self-pay | Admitting: Cardiology

## 2022-11-11 DIAGNOSIS — I255 Ischemic cardiomyopathy: Secondary | ICD-10-CM

## 2022-11-11 NOTE — Telephone Encounter (Signed)
Wife calling in requesting a diagnostic code

## 2022-11-11 NOTE — Telephone Encounter (Signed)
Patient's wife is calling for diagnosis code for the medication Jardiance after speaking with [employer]. Please advise.

## 2022-11-11 NOTE — Telephone Encounter (Addendum)
Spoke with wife per DPR and she states jardiance grant is asking for diagnosis code for jardiance. I gave her two codes from his problem list for his diabetes and CHF.  Wife is requesting samples. Patient has only 2 tabs of jardiance an xarelto. She has applied for assistance for both.

## 2022-11-11 NOTE — Telephone Encounter (Signed)
Spoke with wife per DPR, gave her pharmd recommendations below. She verbalized understanding and repeating information back to me.

## 2022-11-11 NOTE — Telephone Encounter (Signed)
Xarelto:  The drug company offers a program called Xarelto with Me Coverage Gap Support. It runs from April 1-December 31. If you are in the coverage gap, you can get Xarelto for $89 (for 30 days) or $250 (for 90 days), plus sales tax if applicable. You can call to enroll at: 888-XARELTO (808)223-7453)   He can apply for a [employer] grant for his Jardiance. They can either go online to [email or web address] and apply for a grant under the cardiomyopathy fund or can call [phone] to enroll. The income requirement is less strict than patient assistance.

## 2022-11-11 NOTE — Telephone Encounter (Signed)
Patient can have Jardiance 10mg  samples. Of note, patient will need to use ICD code for cardiomyopathy to enroll in [employer]. Recommend code i25.5. Can also have xarelto 20mg  samples

## 2022-11-11 NOTE — Telephone Encounter (Signed)
Pt c/o medication issue:  1. Name of Medication:   rivaroxaban (XARELTO) 20 MG TABS tablet  empagliflozin (JARDIANCE) 10 MG TABS tablet   2. How are you currently taking this medication (dosage and times per day)?   As prescribed  3. Are you having a reaction (difficulty breathing--STAT)?   No  4. What is your medication issue?    Wife stated patient is now in the donut hole for these medications and want to get alternate medication or further advice.

## 2022-11-11 NOTE — Telephone Encounter (Signed)
Wife states patient is in doughnut hole for xarelto and jardiance and they would like cheaper options. She states they do not qualify for patient assistance

## 2022-11-12 ENCOUNTER — Other Ambulatory Visit (HOSPITAL_COMMUNITY): Payer: Self-pay

## 2022-11-12 MED ORDER — EMPAGLIFLOZIN 10 MG PO TABS: 10.0000 mg | ORAL_TABLET | Freq: Every day | ORAL | Status: DC

## 2022-11-12 MED ORDER — RIVAROXABAN 20 MG PO TABS: 20.0000 mg | ORAL_TABLET | Freq: Every day | ORAL | Status: DC

## 2022-11-12 NOTE — Telephone Encounter (Signed)
Spoke with patient's wife per DPR. She is aware samples are ready and at the front desk. She was also given new code for [employer] grant

## 2022-11-12 NOTE — Addendum Note (Signed)
Addended by: Judene Companion on: 11/12/2022 08:39 AM   Modules accepted: Orders

## 2022-11-12 NOTE — Telephone Encounter (Signed)
Spoke with patients wife and codes given.

## 2022-11-20 DIAGNOSIS — J9601 Acute respiratory failure with hypoxia: Secondary | ICD-10-CM | POA: Diagnosis not present

## 2022-11-23 ENCOUNTER — Other Ambulatory Visit: Payer: Self-pay

## 2022-11-23 DIAGNOSIS — R0602 Shortness of breath: Secondary | ICD-10-CM

## 2022-11-23 DIAGNOSIS — J984 Other disorders of lung: Secondary | ICD-10-CM

## 2022-11-24 DIAGNOSIS — H6121 Impacted cerumen, right ear: Secondary | ICD-10-CM | POA: Diagnosis not present

## 2022-11-24 DIAGNOSIS — H9211 Otorrhea, right ear: Secondary | ICD-10-CM | POA: Diagnosis not present

## 2022-11-24 DIAGNOSIS — H7291 Unspecified perforation of tympanic membrane, right ear: Secondary | ICD-10-CM | POA: Diagnosis not present

## 2022-12-02 ENCOUNTER — Other Ambulatory Visit: Payer: Self-pay | Admitting: Nurse Practitioner

## 2022-12-02 DIAGNOSIS — E10649 Type 1 diabetes mellitus with hypoglycemia without coma: Secondary | ICD-10-CM

## 2022-12-02 DIAGNOSIS — H7291 Unspecified perforation of tympanic membrane, right ear: Secondary | ICD-10-CM | POA: Diagnosis not present

## 2022-12-02 DIAGNOSIS — H9211 Otorrhea, right ear: Secondary | ICD-10-CM | POA: Diagnosis not present

## 2022-12-02 DIAGNOSIS — H6592 Unspecified nonsuppurative otitis media, left ear: Secondary | ICD-10-CM | POA: Diagnosis not present

## 2022-12-02 MED ORDER — INSULIN ASPART 100 UNIT/ML IJ SOLN
INTRAMUSCULAR | 1 refills | Status: DC
Start: 2022-12-02 — End: 2023-03-16

## 2022-12-02 NOTE — Telephone Encounter (Signed)
Refill failed. resent

## 2022-12-02 NOTE — Addendum Note (Signed)
Addended by: Julious Payer D on: 12/02/2022 03:12 PM   Modules accepted: Orders

## 2022-12-03 ENCOUNTER — Ambulatory Visit (INDEPENDENT_AMBULATORY_CARE_PROVIDER_SITE_OTHER): Payer: Medicare HMO | Admitting: Nurse Practitioner

## 2022-12-03 ENCOUNTER — Encounter: Payer: Self-pay | Admitting: Nurse Practitioner

## 2022-12-03 ENCOUNTER — Ambulatory Visit (INDEPENDENT_AMBULATORY_CARE_PROVIDER_SITE_OTHER): Payer: Medicare HMO

## 2022-12-03 VITALS — BP 136/77 | HR 71 | Temp 96.7°F | Resp 20 | Ht 68.0 in | Wt 187.0 lb

## 2022-12-03 DIAGNOSIS — R0989 Other specified symptoms and signs involving the circulatory and respiratory systems: Secondary | ICD-10-CM

## 2022-12-03 DIAGNOSIS — J189 Pneumonia, unspecified organism: Secondary | ICD-10-CM

## 2022-12-03 DIAGNOSIS — J929 Pleural plaque without asbestos: Secondary | ICD-10-CM | POA: Diagnosis not present

## 2022-12-03 MED ORDER — AMOXICILLIN-POT CLAVULANATE 875-125 MG PO TABS
1.0000 | ORAL_TABLET | Freq: Two times a day (BID) | ORAL | 0 refills | Status: DC
Start: 2022-12-03 — End: 2022-12-28

## 2022-12-03 NOTE — Patient Instructions (Signed)

## 2022-12-03 NOTE — Progress Notes (Signed)
Subjective:    Patient ID: Thomas Ramus., male    DOB: 13-Feb-1956, 67 y.o.   MRN: [id number]   Chief Complaint: cough  Cough This is a new problem. The current episode started more than 1 year ago. The problem has been waxing and waning. The problem occurs constantly. The cough is Productive of sputum. Associated symptoms include a fever, nasal congestion and rhinorrhea. Pertinent negatives include no chills, ear congestion, ear pain, sore throat or shortness of breath. Nothing aggravates the symptoms. Treatments tried: oxygen continuous. The treatment provided moderate relief. His past medical history is significant for COPD, emphysema and pneumonia.    Patient Active Problem List   Diagnosis Date Noted   Chronic SI joint pain 07/20/2022   Spondylosis without myelopathy or radiculopathy, lumbar region 04/20/2022   Encounter for long-term opiate analgesic use 04/20/2022   Chronic respiratory failure with hypoxia (HCC) 02/27/2022   Persistent atrial fibrillation (HCC) 02/17/2022   Encounter for therapeutic drug monitoring 01/19/2022   Chronic pain syndrome 12/22/2021   Tobacco use 12/17/2021   Other intervertebral disc degeneration, lumbar region 12/11/2021   Nodule of left lung 08/18/2021   Demand ischemia 08/17/2021   Persistent atrial fibrillation/flutter with rapid ventricular response (HCC) 07/16/2021   Chronic combined systolic and diastolic CHF (congestive heart failure) (HCC) 07/16/2021   Hypercoagulable state due to persistent atrial fibrillation (HCC) 12/25/2020   Alcohol-induced chronic pancreatitis (HCC) 12/17/2020   Delirium tremens (HCC) 12/17/2020   COPD with chronic bronchitis and emphysema (HCC)    Hearing loss 07/26/2019   Chronic back pain 05/02/2019   Mild renal insufficiency 08/25/2017   Prepyloric ulcer 12/12/2014   Essential hypertension 12/07/2014   Mitral valve prolapse 12/06/2014   Atrial fibrillation with RVR (HCC) 11/26/2014   GERD  (gastroesophageal reflux disease) 02/06/2013   Uncontrolled type 1 diabetes mellitus with hypoglycemia, with long-term current use of insulin (HCC) 02/16/2011       Review of Systems  Constitutional:  Positive for fatigue and fever. Negative for chills.  HENT:  Positive for rhinorrhea. Negative for ear pain and sore throat.   Respiratory:  Positive for cough. Negative for shortness of breath.        Objective:   Physical Exam Constitutional:      Appearance: Normal appearance.  Cardiovascular:     Rate and Rhythm: Normal rate and regular rhythm.     Heart sounds: Normal heart sounds.  Pulmonary:     Effort: Pulmonary effort is normal.     Breath sounds: Wheezing (insp and exp throughout) present.     Comments: Wet cough O2 via nasal cannula at 3L- he puts nasal cannula in his mouth Skin:    General: Skin is warm.  Neurological:     General: No focal deficit present.     Mental Status: He is alert and oriented to person, place, and time.  Psychiatric:        Mood and Affect: Mood normal.        Behavior: Behavior normal.    BP 136/77   Pulse 71   Temp (!) 96.7 F (35.9 C) (Temporal)   Resp 20   Ht 5\' 8"  (1.727 m)   Wt 187 lb (84.8 kg)   SpO2 98% Comment: 3 liters O2  BMI 28.43 kg/m         Assessment & Plan:   Thomas Ramus. in today with chief complaint of chest congestion   1. Chest congestion - DG Chest 2  View  2. Community acquired pneumonia of left lower lobe of lung 1. Take meds as prescribed 2. Use a cool mist humidifier especially during the winter months and when heat has been humid. 3. Use saline nose sprays frequently 4. Saline irrigations of the nose can be very helpful if done frequently.  * 4X daily for 1 week*  * Use of a nettie pot can be helpful with this. Follow directions with this* 5. Drink plenty of fluids 6. Keep thermostat turn down low 7.For any cough or congestion- mucinex OTC 8. For fever or aces or pains- take  tylenol or ibuprofen appropriate for age and weight.  * for fevers greater than 101 orally you may alternate ibuprofen and tylenol every  3 hours.   Meds ordered this encounter  Medications   amoxicillin-clavulanate (AUGMENTIN) 875-125 MG tablet    Sig: Take 1 tablet by mouth 2 (two) times daily.    Dispense:  20 tablet    Refill:  0    Order Specific Question:   Supervising Provider    Answer:   Raliegh Ip [[id number]]   Allergic to zithromax    The above assessment and management plan was discussed with the patient. The patient verbalized understanding of and has agreed to the management plan. Patient is aware to call the clinic if symptoms persist or worsen. Patient is aware when to return to the clinic for a follow-up visit. Patient educated on when it is appropriate to go to the emergency department.   Thomas Daphine Deutscher, FNP

## 2022-12-10 ENCOUNTER — Other Ambulatory Visit (HOSPITAL_COMMUNITY): Payer: Self-pay

## 2022-12-11 ENCOUNTER — Other Ambulatory Visit: Payer: Self-pay | Admitting: Nurse Practitioner

## 2022-12-11 DIAGNOSIS — R195 Other fecal abnormalities: Secondary | ICD-10-CM

## 2022-12-11 DIAGNOSIS — Z1211 Encounter for screening for malignant neoplasm of colon: Secondary | ICD-10-CM

## 2022-12-11 DIAGNOSIS — Z1212 Encounter for screening for malignant neoplasm of rectum: Secondary | ICD-10-CM

## 2022-12-12 ENCOUNTER — Other Ambulatory Visit (HOSPITAL_COMMUNITY): Payer: Self-pay

## 2022-12-17 ENCOUNTER — Telehealth: Payer: Self-pay | Admitting: Cardiology

## 2022-12-17 ENCOUNTER — Encounter: Payer: Medicare HMO | Admitting: Physical Medicine & Rehabilitation

## 2022-12-17 ENCOUNTER — Telehealth: Payer: Self-pay | Admitting: Nurse Practitioner

## 2022-12-17 DIAGNOSIS — J449 Chronic obstructive pulmonary disease, unspecified: Secondary | ICD-10-CM

## 2022-12-17 NOTE — Telephone Encounter (Signed)
Patient calling the office for samples of medication:   1.  What medication and dosage are you requesting samples for? Xarelo  2.  Are you currently out of this medication? Need some until he gets his from patient assistance

## 2022-12-17 NOTE — Telephone Encounter (Signed)
Pts wife needs Rx for 3 month supply of Trelegy sent to his Patient Assistance plan for approval, to last him until end of year. Please advise.

## 2022-12-17 NOTE — Telephone Encounter (Signed)
Routed to Pharmacy Team to review sample request for anticoagulant

## 2022-12-17 NOTE — Telephone Encounter (Signed)
We do not complete patient assistance for his Trelegy Is he referring to pulmonology?

## 2022-12-18 MED ORDER — RIVAROXABAN 20 MG PO TABS: 20.0000 mg | ORAL_TABLET | Freq: Every day | ORAL | Status: DC

## 2022-12-18 NOTE — Telephone Encounter (Signed)
Attempted to contact - NA 

## 2022-12-18 NOTE — Telephone Encounter (Signed)
There is not true patient assistance this year with the patient gets it for free.  Please confirm that he is talking about the Xarelto with me coverage Support program where he gets it for about $89 per month.  He can call 888-XARELTO [phone]) for more information or go to the website.  I do believe this program allows them to use a community pharmacy instead of [facility] specialty pharmacy this year.  It also looks like we gave him samples in the beginning of September.

## 2022-12-18 NOTE — Telephone Encounter (Signed)
Yes, ok to give 1 week of Xarelto 20mg  daily.

## 2022-12-18 NOTE — Telephone Encounter (Signed)
Spoke with Pt's wife and let her know 1 week sample of XARELTO 20mg  has been left at front desk for patient. She verbalized understanding. No further questions at this time.

## 2022-12-18 NOTE — Telephone Encounter (Signed)
Wife states patient is not going to be seeing the pulmonologist anymore. They have already spoken with patient assistance and she just needs a prescription to send to them.

## 2022-12-18 NOTE — Addendum Note (Signed)
Addended by: Jonah Blue on: 12/18/2022 03:28 PM   Modules accepted: Orders

## 2022-12-18 NOTE — Telephone Encounter (Signed)
Okay, I'm guessing the original prescription assistance application for Trelegy is under pulmonology MD.  We can try to send in refills, but the company may not accept.  Does she have the name of the company and where to send (fax #)? I'm confused bc it shows he's getting It filled at the local pharmacy and I don't see any documentation of patient assistance for Trelegy.

## 2022-12-18 NOTE — Telephone Encounter (Signed)
Returned call, spoke with pt's wife. She states she has called the provided number, they can afford it however the pt have 1 week of medication left and it may be 2 weeks before he can actually get the medication thru the mail. She wants to know can they have a week or two of samples to carry them over until they get them.

## 2022-12-20 DIAGNOSIS — J9601 Acute respiratory failure with hypoxia: Secondary | ICD-10-CM | POA: Diagnosis not present

## 2022-12-22 ENCOUNTER — Other Ambulatory Visit: Payer: Self-pay | Admitting: Pharmacist

## 2022-12-22 DIAGNOSIS — I4819 Other persistent atrial fibrillation: Secondary | ICD-10-CM

## 2022-12-22 MED ORDER — RIVAROXABAN 20 MG PO TABS
20.0000 mg | ORAL_TABLET | Freq: Every day | ORAL | 2 refills | Status: DC
Start: 2022-12-22 — End: 2023-02-26

## 2022-12-23 MED ORDER — TRELEGY ELLIPTA 100-62.5-25 MCG/ACT IN AEPB: INHALATION_SPRAY | RESPIRATORY_TRACT | 3 refills | Status: DC

## 2022-12-23 NOTE — Telephone Encounter (Signed)
Called and spoke with patients wife and she says that she already has a company that will send him the Trelegy, she just need a 90 day rx from [place]. Rx written and printed and covering provider signed. Left rx up front for patients wife to pick up.

## 2022-12-23 NOTE — Telephone Encounter (Signed)
Pt wife r/c

## 2022-12-23 NOTE — Addendum Note (Signed)
Addended by: Cleda Daub on: 12/23/2022 02:26 PM   Modules accepted: Orders

## 2022-12-28 ENCOUNTER — Ambulatory Visit (INDEPENDENT_AMBULATORY_CARE_PROVIDER_SITE_OTHER): Payer: Medicare HMO | Admitting: Nurse Practitioner

## 2022-12-28 ENCOUNTER — Encounter: Payer: Self-pay | Admitting: Nurse Practitioner

## 2022-12-28 VITALS — BP 143/73 | HR 70 | Temp 98.4°F | Resp 20 | Ht 68.0 in | Wt 186.0 lb

## 2022-12-28 DIAGNOSIS — R0989 Other specified symptoms and signs involving the circulatory and respiratory systems: Secondary | ICD-10-CM

## 2022-12-28 MED ORDER — DOXYCYCLINE HYCLATE 100 MG PO TABS
100.0000 mg | ORAL_TABLET | Freq: Two times a day (BID) | ORAL | 0 refills | Status: DC
Start: 2022-12-28 — End: 2023-01-12

## 2022-12-28 NOTE — Progress Notes (Signed)
Subjective:    Patient ID: Thomas Ramus., male    DOB: [birth year], 67 y.o.   MRN: [id number]   Chief Complaint: congestion  HPI  Patient in c/o lung congestion. He is very prone  to pneumonia with his COPD. Oxygen levels have been dropping. Worse at night. He is on oxygen at 3l via cannula. He was givne antibiotics by pulmonology  last year and he was to take them monthly for a week, but he is out. Has not seen pulmonology in awhile.  Patient Active Problem List   Diagnosis Date Noted   Chronic SI joint pain 07/20/2022   Spondylosis without myelopathy or radiculopathy, lumbar region 04/20/2022   Encounter for long-term opiate analgesic use 04/20/2022   Chronic respiratory failure with hypoxia (HCC) 02/27/2022   Persistent atrial fibrillation (HCC) 02/17/2022   Encounter for therapeutic drug monitoring 01/19/2022   Chronic pain syndrome 12/22/2021   Tobacco use 12/17/2021   Other intervertebral disc degeneration, lumbar region 12/11/2021   Nodule of left lung 08/18/2021   Demand ischemia (HCC) 08/17/2021   Persistent atrial fibrillation/flutter with rapid ventricular response (HCC) 07/16/2021   Chronic combined systolic and diastolic CHF (congestive heart failure) (HCC) 07/16/2021   Hypercoagulable state due to persistent atrial fibrillation (HCC) 12/25/2020   Alcohol-induced chronic pancreatitis (HCC) 12/17/2020   Delirium tremens (HCC) 12/17/2020   COPD with chronic bronchitis and emphysema (HCC)    Hearing loss 07/26/2019   Chronic back pain 05/02/2019   Mild renal insufficiency 08/25/2017   Prepyloric ulcer 12/12/2014   Essential hypertension 12/07/2014   Mitral valve prolapse 12/06/2014   Atrial fibrillation with RVR (HCC) 11/26/2014   GERD (gastroesophageal reflux disease) 02/06/2013   Uncontrolled type 1 diabetes mellitus with hypoglycemia, with long-term current use of insulin (HCC) 02/16/2011       Review of Systems  Constitutional:  Positive for fatigue.  Negative for chills and fever.  HENT:  Positive for congestion.   Respiratory:  Positive for cough, shortness of breath and wheezing.   Cardiovascular: Negative.        Objective:   Physical Exam Vitals and nursing note reviewed.  Constitutional:      Appearance: Normal appearance. He is well-developed.  HENT:     Head: Normocephalic.     Nose: Nose normal.     Mouth/Throat:     Mouth: Mucous membranes are moist.     Pharynx: Oropharynx is clear.  Eyes:     Pupils: Pupils are equal, round, and reactive to light.  Neck:     Thyroid: No thyroid mass or thyromegaly.     Vascular: No carotid bruit or JVD.     Trachea: Phonation normal.  Cardiovascular:     Rate and Rhythm: Normal rate and regular rhythm.  Pulmonary:     Effort: Pulmonary effort is normal. No respiratory distress.     Breath sounds: Wheezing (exp wheezes) and rales (fine crackles right lower lobe) present.  Abdominal:     General: Bowel sounds are normal.     Palpations: Abdomen is soft.     Tenderness: There is no abdominal tenderness.  Musculoskeletal:        General: Normal range of motion.     Cervical back: Normal range of motion and neck supple.  Lymphadenopathy:     Cervical: No cervical adenopathy.  Skin:    General: Skin is warm and dry.  Neurological:     Mental Status: He is alert and oriented to person, place, and  time.  Psychiatric:        Behavior: Behavior normal.        Thought Content: Thought content normal.        Judgment: Judgment normal.    BP (!) 143/73   Pulse 70   Temp 98.4 F (36.9 C) (Temporal)   Resp 20   Ht 5\' 8"  (1.727 m)   Wt 186 lb (84.4 kg)   SpO2 96% Comment: 2 L/min  BMI 28.28 kg/m         Assessment & Plan:  Thomas Ramus. in today with chief complaint of Cough and Nasal Congestion   1. Chest congestion 1. Take meds as prescribed 2. Use a cool mist humidifier especially during the winter months and when heat has been humid. 3. Use saline nose  sprays frequently 4. Saline irrigations of the nose can be very helpful if done frequently.  * 4X daily for 1 week*  * Use of a nettie pot can be helpful with this. Follow directions with this* 5. Drink plenty of fluids 6. Keep thermostat turn down low 7.For any cough or congestion- mucinex 8. For fever or aces or pains- take tylenol or ibuprofen appropriate for age and weight.  * for fevers greater than 101 orally you may alternate ibuprofen and tylenol every  3 hours.   Inhalers and nebulizers as prescribed Continue 24/7 oxygen Continue to keep check of o2 sat.  - doxycycline (VIBRA-TABS) 100 MG tablet; Take 1 tablet (100 mg total) by mouth 2 (two) times daily. 1 po bid  Dispense: 20 tablet; Refill: 0  Needs follow up appointment with pulmonology- wife will call and make him an appointment  The above assessment and management plan was discussed with the patient. The patient verbalized understanding of and has agreed to the management plan. Patient is aware to call the clinic if symptoms persist or worsen. Patient is aware when to return to the clinic for a follow-up visit. Patient educated on when it is appropriate to go to the emergency department.   [name] Daphine Deutscher, FNP

## 2022-12-29 ENCOUNTER — Telehealth: Payer: Self-pay | Admitting: Emergency Medicine

## 2022-12-29 ENCOUNTER — Ambulatory Visit (HOSPITAL_COMMUNITY)
Admission: RE | Admit: 2022-12-29 | Discharge: 2022-12-29 | Disposition: A | Discharge status: Home / Self Care | Payer: Medicare HMO | Source: Ambulatory Visit | Attending: Physician Assistant | Admitting: Physician Assistant

## 2022-12-29 ENCOUNTER — Encounter (HOSPITAL_COMMUNITY): Payer: Self-pay | Admitting: Physician Assistant

## 2022-12-29 VITALS — BP 108/70 | HR 96 | Ht 68.0 in | Wt 185.8 lb

## 2022-12-29 DIAGNOSIS — Z79899 Other long term (current) drug therapy: Secondary | ICD-10-CM | POA: Diagnosis not present

## 2022-12-29 DIAGNOSIS — I4892 Unspecified atrial flutter: Secondary | ICD-10-CM | POA: Insufficient documentation

## 2022-12-29 DIAGNOSIS — Z7901 Long term (current) use of anticoagulants: Secondary | ICD-10-CM | POA: Insufficient documentation

## 2022-12-29 DIAGNOSIS — I5042 Chronic combined systolic (congestive) and diastolic (congestive) heart failure: Secondary | ICD-10-CM | POA: Insufficient documentation

## 2022-12-29 DIAGNOSIS — Z5181 Encounter for therapeutic drug level monitoring: Secondary | ICD-10-CM

## 2022-12-29 DIAGNOSIS — J449 Chronic obstructive pulmonary disease, unspecified: Secondary | ICD-10-CM | POA: Insufficient documentation

## 2022-12-29 DIAGNOSIS — K219 Gastro-esophageal reflux disease without esophagitis: Secondary | ICD-10-CM | POA: Insufficient documentation

## 2022-12-29 DIAGNOSIS — I110 Hypertensive heart disease with heart failure: Secondary | ICD-10-CM | POA: Insufficient documentation

## 2022-12-29 DIAGNOSIS — I739 Peripheral vascular disease, unspecified: Secondary | ICD-10-CM | POA: Insufficient documentation

## 2022-12-29 DIAGNOSIS — Z8711 Personal history of peptic ulcer disease: Secondary | ICD-10-CM | POA: Insufficient documentation

## 2022-12-29 DIAGNOSIS — I4819 Other persistent atrial fibrillation: Secondary | ICD-10-CM | POA: Diagnosis not present

## 2022-12-29 DIAGNOSIS — F17210 Nicotine dependence, cigarettes, uncomplicated: Secondary | ICD-10-CM | POA: Insufficient documentation

## 2022-12-29 DIAGNOSIS — I4439 Other atrioventricular block: Secondary | ICD-10-CM | POA: Insufficient documentation

## 2022-12-29 DIAGNOSIS — E109 Type 1 diabetes mellitus without complications: Secondary | ICD-10-CM | POA: Diagnosis not present

## 2022-12-29 DIAGNOSIS — D6869 Other thrombophilia: Secondary | ICD-10-CM

## 2022-12-29 DIAGNOSIS — I2510 Atherosclerotic heart disease of native coronary artery without angina pectoris: Secondary | ICD-10-CM | POA: Diagnosis not present

## 2022-12-29 DIAGNOSIS — I484 Atypical atrial flutter: Secondary | ICD-10-CM

## 2022-12-29 DIAGNOSIS — Z7984 Long term (current) use of oral hypoglycemic drugs: Secondary | ICD-10-CM | POA: Insufficient documentation

## 2022-12-29 DIAGNOSIS — Z794 Long term (current) use of insulin: Secondary | ICD-10-CM | POA: Diagnosis not present

## 2022-12-29 LAB — BASIC METABOLIC PANEL
Anion gap: 10 (ref 5–15)
BUN: 17 mg/dL (ref 8–23)
CO2: 29 mmol/L (ref 22–32)
Calcium: 9.7 mg/dL (ref 8.9–10.3)
Chloride: 98 mmol/L (ref 98–111)
Creatinine, Ser: 1.53 mg/dL — ABNORMAL HIGH (ref 0.61–1.24)
GFR, Estimated: 50 mL/min — ABNORMAL LOW (ref >60)
Glucose, Bld: 105 mg/dL — ABNORMAL HIGH (ref 70–99)
Potassium: 4 mmol/L (ref 3.5–5.1)
Sodium: 137 mmol/L (ref 135–145)

## 2022-12-29 LAB — MAGNESIUM: Magnesium: 1.9 mg/dL (ref 1.7–2.4)

## 2022-12-29 NOTE — Telephone Encounter (Signed)
Lm x1 for patient.   Walk test will need to be performed during visit.

## 2022-12-29 NOTE — Telephone Encounter (Signed)
Patient is currently on 3 liters of oxygen but needs more. His current appointment is for November 7th,2024 with Dr. Delton Coombes.

## 2022-12-29 NOTE — Progress Notes (Signed)
Primary Care Physician: Bennie Pierini, FNP Primary Cardiologist: Dr Bjorn Pippin Primary Electrophysiologist: Dr Lalla Brothers Referring Physician: Gilford Silvius NP   [name] is a 67 y.o. male with a history of alcohol abuse, tobacco abuse, COPD, MVP, perforated gastric ulcer s/p repair 2016, DM, HTN, PAD (bilateral fem-pop occlusion), GERD, systolic CHF, atrial flutter, atrial fibrillation who presents for follow up in the [facility]. The patient was initially diagnosed with atrial flutter in 2012 and afib in the setting of his perforated ulcer. Patient has a CHADS2VASC score of 4, has declined anticoagulation in the past. He was hospitalized 9/26-9/30/22 with acute alcoholic pancreatitis and was also found to be in afib at the time. Echo showed EF 30-35%. He left the hospital AMA. At his visit with his PCP 12/17/20 he was still in afib and was started on diltiazem and Xarelto. Patient is s/p DCCV on 01/20/21 but unfortunately was back in afib by follow up on 01/24/21. He did not feel any different in SR.   Patient seen by Dr Lalla Brothers 03/26/21 and discussed ablation but patient deferred at that time. He was admitted 07/2021 for community acquired pneumonia and rapid atrial flutter. He remained in atrial flutter at his visit with Dr Bjorn Pippin on 08/20/21.  Patient was admitted 11/2021 after being found unresponsive by family. They described possible seizure-like activity. It is unclear whether he had witnessed syncope.  There is no report of prodromal chest pain or shortness of breath.  On EMS arrival he was unresponsive, was hypoglycemic (fingerstick blood sugar 48) and severely hypotensive (60/40 mmHg).  He received a liter of fluid, Narcan (without improvement) and intravenous dextrose.  His glucose increased to over 200 but he remained unresponsive.  His ECG shows diffuse ST segment depression in all leads except aVR where there is mild ST segment elevation.  Symptoms felt to be related to hypoglycemia. Ischemic workup was not pursued at that time. He was in afib but converted to SR prior to discharge. Patient was scheduled to have cardiac CT but was back in afib at the time and the scan was cancelled.   Patient is s/p dofetilide admission 12/12-12/15/23. He converted chemically but reverted back to afib prior to discharge. Prior to pt receiving meds or discharge instructions pt left AMA, without signing paperwork or even having his IV removed. Pt was becoming edgy and just decided he was ready to leave. EP team discussed importance of compliance with medication and follow up. He has converted to SR since discharge.   On follow up today, patient was seen by his PCP yesterday and was started on doxycycline. He does feel more SOB recently. He is in atrial flutter today. No bleeding issues on anticoagulation. He has a follow up with his pulmonologist to discuss his chest congestion.   Today, he denies symptoms of palpitations, chest pain, orthopnea, PND,  dizziness, presyncope, syncope, snoring, daytime somnolence, bleeding, or neurologic sequela. The patient is tolerating medications without difficulties and is otherwise without complaint today.    Atrial Fibrillation Risk Factors:  he does not have symptoms or diagnosis of sleep apnea. he does not have a history of rheumatic fever. he does have a history of alcohol use. The patient does not have a history of early familial atrial fibrillation or other arrhythmias.   Atrial Fibrillation Management history:  Previous antiarrhythmic drugs: dofetilide  Previous cardioversions: 08/25/17, 01/20/21 Previous ablations: none Anticoagulation history: Xarelto    Past Medical History:  Diagnosis Date  Arthritis    Bladder cancer (HCC)    Chronic back pain    Chronic combined systolic and diastolic CHF (congestive heart failure) (HCC) 07/16/2021   COPD (chronic obstructive pulmonary disease) (HCC)     DDD (degenerative disc disease)    Diabetic retinopathy of both eyes (HCC)    Essential hypertension    GERD (gastroesophageal reflux disease)    History of atrial flutter 02/07/2011   Converted to NSR with Cardizem   History of chronic bronchitis    History of hemolytic anemia 02/07/2011   secondary to Avelox   HOH (hard of hearing)    HOH (hard of hearing)    no eardrum and nerve damage on R, also HOH on L   Mild renal insufficiency 08/25/2017   Mitral valve prolapse    a. 2D Echo 11/27/14: EF 55-60%; images were inadequate for LV wall motion assessment, + mild late systolic mitral valve prolapse involving the anterior leaflet.   PAD (peripheral artery disease) (HCC) 04/09/2014   Dr Myra Gianotti; bilateral SFA occlusion, R mid, L distal   PAF (paroxysmal atrial fibrillation) (HCC)    a. Dx 11/2014 during admission for perf ulcer.   Perforated ulcer (HCC)    a. 11/2014 s/p surgery.   Productive cough    Smokers' cough (HCC)    Type 1 diabetes mellitus (HCC) 03/10/1975    Current Outpatient Medications  Medication Sig Dispense Refill   albuterol (VENTOLIN HFA) 108 (90 Base) MCG/ACT inhaler Inhale 2 puffs into the lungs every 6 (six) hours as needed for wheezing or shortness of breath. 18 g 2   cetirizine (ZYRTEC) 10 MG tablet Take 10 mg by mouth daily.     dofetilide (TIKOSYN) 250 MCG capsule TAKE 1 CAPSULE BY MOUTH TWICE A DAY 180 capsule 1   doxycycline (VIBRA-TABS) 100 MG tablet Take 1 tablet (100 mg total) by mouth 2 (two) times daily. 1 po bid 20 tablet 0   empagliflozin (JARDIANCE) 10 MG TABS tablet TAKE 1 TABLET BY MOUTH DAILY BEFORE BREAKFAST. 90 tablet 1   empagliflozin (JARDIANCE) 10 MG TABS tablet Take 1 tablet (10 mg total) by mouth daily before breakfast.     famotidine (PEPCID) 20 MG tablet Take 1 tablet (20 mg total) by mouth every morning. Reported on 04/16/2015 90 tablet 1   Fluticasone-Umeclidin-Vilant (TRELEGY ELLIPTA) 100-62.5-25 MCG/ACT AEPB INHALE 1 PUFF INTO THE LUNGS  TWICE A DAY 180 each 3   furosemide (LASIX) 40 MG tablet TAKE 1 TABLET BY MOUTH DAILY AS NEEDED (WEIGHT GAIN OF 3 LBS OVERNIGHT OR 5 LBS IN A WEEK). 90 tablet 1   HYDROcodone-acetaminophen (NORCO) 5-325 MG tablet Take 1 tablet by mouth 3 (three) times daily as needed for moderate pain or severe pain. 90 tablet 0   [START ON 01/11/2023] HYDROcodone-acetaminophen (NORCO) 5-325 MG tablet Take 1 tablet by mouth 3 (three) times daily as needed for moderate pain or severe pain. 90 tablet 0   insulin aspart (NOVOLOG) 100 UNIT/ML injection PER SLIDING SCALE: 190 - 200 = 2 UNITS. 300 AND ABOVE = 7 UNITS. 10 mL 1   insulin glargine (LANTUS) 100 UNIT/ML injection INJECT 0.4 MLS (40 UNITS TOTAL) INTO THE SKIN IN THE MORNING. 30 mL 0   Insulin Syringe-Needle U-100 (B-D INS SYR ULTRAFINE 1CC/30G) 30G X 1/2" 1 ML MISC Use 4 times a day with insulin Dx E11.9 400 each 3   Insulin Syringes, Disposable, U-100 1 ML MISC Use 4 times a day for insulin injection Dx E11.9 400  each 5   ipratropium-albuterol (DUONEB) 0.5-2.5 (3) MG/3ML SOLN TAKE 3 MLS BY NEBULIZATION EVERY 4 (FOUR) HOURS AS NEEDED. 360 mL 5   levalbuterol (XOPENEX) 0.31 MG/3ML nebulizer solution TAKE 3 MLS (0.31 MG TOTAL) BY NEBULIZATION EVERY 4 (FOUR) HOURS AS NEEDED FOR WHEEZING. 360 mL 3   losartan (COZAAR) 50 MG tablet Take 1 tablet (50 mg total) by mouth daily. 90 tablet 1   metoprolol succinate (TOPROL-XL) 50 MG 24 hr tablet Take 1.5 tablets (75 mg total) by mouth 2 (two) times daily. Take with or immediately following a meal. 270 tablet 2   naloxone (NARCAN) nasal spray 4 mg/0.1 mL Place 1 spray into the nose as needed (For opiate overdose). 1 each 2   OVER THE COUNTER MEDICATION Take 1-2 tablets by mouth See admin instructions. Super Beets gummies- Chew 1-2 gummies by mouth every day     OXYGEN Inhale 3 L/min into the lungs continuous.     rivaroxaban (XARELTO) 20 MG TABS tablet Take 1 tablet (20 mg total) by mouth daily with supper. 30 tablet 2   No  current facility-administered medications for this encounter.    ROS- All systems are reviewed and negative except as per the HPI above.  Physical Exam: Vitals:   12/29/22 1340  BP: 108/70  Pulse: 96  Weight: 84.3 kg  Height: 5\' 8"  (1.727 m)     GEN: Well nourished, well developed in no acute distress NECK: No JVD; No carotid bruits CARDIAC: Irregularly irregular rate and rhythm, no murmurs, rubs, gallops RESPIRATORY:  Faint wheezing bilaterally, nasal canula O2 ABDOMEN: Soft, non-tender, non-distended EXTREMITIES:  No edema; No deformity    Wt Readings from Last 3 Encounters:  12/29/22 84.3 kg  12/28/22 84.4 kg  12/03/22 84.8 kg    EKG today demonstrates  Atrial flutter with variable block, PVC Vent. rate 96 BPM PR interval * ms QRS duration 78 ms QT/QTcB 380/480 ms  Echo 05/21/21 demonstrated   1. Left ventricular ejection fraction, by estimation, is 55 to 60%. The  left ventricle has normal function. The left ventricle has no regional  wall motion abnormalities. Left ventricular diastolic parameters are  consistent with Grade II diastolic dysfunction (pseudonormalization). Elevated left ventricular end-diastolic pressure.   2. Right ventricular systolic function is normal. The right ventricular  size is normal. There is normal pulmonary artery systolic pressure.   3. The mitral valve is normal in structure. No evidence of mitral valve  regurgitation. No evidence of mitral stenosis.   4. The aortic valve is tricuspid. Aortic valve regurgitation is not  visualized. No aortic stenosis is present.   5. The inferior vena cava is normal in size with greater than 50%  respiratory variability, suggesting right atrial pressure of 3 mmHg.   Epic records are reviewed at length today  CHA2DS2-VASc Score = 5  The patient's score is based upon: CHF History: 1 HTN History: 1 Diabetes History: 1 Stroke History: 0 Vascular Disease History: 1 Age Score: 1 Gender Score: 0         ASSESSMENT AND PLAN: Persistent Atrial Fibrillation/atrial flutter The patient's CHA2DS2-VASc score is 5, indicating a 7.2% annual risk of stroke.   S/p dofetilide admission 12/12-12/15/23. Patient in atrial flutter today. Kardia mobile strips personally reviewed today and he is in SR most of the time. Will not schedule a DCCV right now. I think his afib treatment is optimized at this point given his comorbidities.  Continue dofetilide 250 mcg BID, QT stable Check bmet/mag  today Continue Xarelto 20 mg daily  Continue Toprol 100 mg daily  Secondary Hypercoagulable State (ICD10:  D68.69) The patient is at significant risk for stroke/thromboembolism based upon his CHA2DS2-VASc Score of 5.  Continue Rivaroxaban (Xarelto).   HFimpEF EF recovered to 55-60% Fluid status appears stable  HTN Stable on current regimen  PAD Followed by Dr Myra Gianotti at [facility] Patient continues to smoke 0.5 pack per day  CAD Severe 3-vessel coronary calcifications seen on CT PET scan showed small area of ischemia Followed by Dr Bjorn Pippin   Follow up in the AF clinic in 6 months.    Jorja Loa PA-C [facility] [address] [place], [place] [address] [phone] 12/29/2022 1:50 PM

## 2022-12-30 ENCOUNTER — Telehealth: Payer: Self-pay | Admitting: Nurse Practitioner

## 2022-12-30 DIAGNOSIS — J449 Chronic obstructive pulmonary disease, unspecified: Secondary | ICD-10-CM

## 2022-12-30 DIAGNOSIS — Z1212 Encounter for screening for malignant neoplasm of rectum: Secondary | ICD-10-CM | POA: Diagnosis not present

## 2022-12-30 DIAGNOSIS — Z1211 Encounter for screening for malignant neoplasm of colon: Secondary | ICD-10-CM | POA: Diagnosis not present

## 2022-12-30 NOTE — Telephone Encounter (Signed)
Patient aware.

## 2022-12-30 NOTE — Telephone Encounter (Signed)
Please advise 

## 2022-12-31 DIAGNOSIS — Z008 Encounter for other general examination: Secondary | ICD-10-CM | POA: Diagnosis not present

## 2022-12-31 NOTE — Telephone Encounter (Signed)
This is prescribed by  pulmonology and they need to change prescription

## 2023-01-01 MED ORDER — TRELEGY ELLIPTA 100-62.5-25 MCG/ACT IN AEPB
INHALATION_SPRAY | RESPIRATORY_TRACT | 3 refills | Status: DC
Start: 2023-01-01 — End: 2023-10-08

## 2023-01-01 NOTE — Addendum Note (Signed)
Addended by: Bennie Pierini on: 01/01/2023 02:09 PM   Modules accepted: Orders

## 2023-01-08 ENCOUNTER — Telehealth: Payer: Self-pay | Admitting: Nurse Practitioner

## 2023-01-08 DIAGNOSIS — J42 Unspecified chronic bronchitis: Secondary | ICD-10-CM

## 2023-01-08 NOTE — Telephone Encounter (Signed)
  Prescription Request  01/08/2023  Is this a "Controlled Substance" medicine? NEW Nebulizar machine. He already has solution.  Have you seen your PCP in the last 2 weeks? no  If YES, route message to pool  -  If NO, patient needs to be scheduled for appointment.  What is the name of the medication or equipment? Nebulizar machine  Have you contacted your pharmacy to request a refill? New rx   Which pharmacy would you like this sent to? [facility] in [place]   Spouse asked for call back when done   Patient notified that their request is being sent to the clinical staff for review and that they should receive a response within 2 business days.

## 2023-01-10 LAB — COLOGUARD: COLOGUARD: POSITIVE — AB

## 2023-01-11 NOTE — Telephone Encounter (Signed)
New rx printed and sent up front to fax

## 2023-01-11 NOTE — Telephone Encounter (Signed)
Please order him a nebulizer machine

## 2023-01-11 NOTE — Addendum Note (Signed)
Addended by: Bennie Pierini on: 01/11/2023 04:39 PM   Modules accepted: Orders

## 2023-01-12 ENCOUNTER — Encounter: Payer: Self-pay | Admitting: Physical Medicine & Rehabilitation

## 2023-01-12 ENCOUNTER — Encounter: Payer: Medicare HMO | Attending: Registered Nurse | Admitting: Physical Medicine & Rehabilitation

## 2023-01-12 ENCOUNTER — Other Ambulatory Visit (HOSPITAL_COMMUNITY): Payer: Self-pay

## 2023-01-12 VITALS — BP 103/59 | HR 87 | Ht 68.0 in | Wt 175.6 lb

## 2023-01-12 DIAGNOSIS — Z720 Tobacco use: Secondary | ICD-10-CM | POA: Diagnosis not present

## 2023-01-12 DIAGNOSIS — M47816 Spondylosis without myelopathy or radiculopathy, lumbar region: Secondary | ICD-10-CM | POA: Diagnosis not present

## 2023-01-12 DIAGNOSIS — Z5181 Encounter for therapeutic drug level monitoring: Secondary | ICD-10-CM | POA: Diagnosis not present

## 2023-01-12 DIAGNOSIS — G894 Chronic pain syndrome: Secondary | ICD-10-CM | POA: Diagnosis not present

## 2023-01-12 DIAGNOSIS — Z79891 Long term (current) use of opiate analgesic: Secondary | ICD-10-CM | POA: Insufficient documentation

## 2023-01-12 NOTE — Patient Instructions (Signed)
Will do 2 sets of test blocks to see if the nerve burning would be helpful

## 2023-01-12 NOTE — Progress Notes (Signed)
Subjective:    Patient ID: Thomas Sandoval., male    DOB: October 11, 1955, 67 y.o.   MRN: [id number]  HPI 67 year old male referred by Dr. Shearon Stalls and seen by myself originally in January 2024 to evaluate for interventional pain procedures for patient's chronic pain complaints. CC:  Back pain Patient states his pain is mainly around his waist.  He has had no recent falls.  He has no numbness/tingling in his lower extremities.  He has a history of O2 dependent COPD as well as atrial fibrillation and heart failure.  His exercise tolerance is severely diminished as a result. Denies sciatic pain   Breathing issues limit mobility as well as pain, not walking far    Pain Inventory Average Pain 7 Pain Right Now 6 My pain is sharp, burning, and dull  In the last 24 hours, has pain interfered with the following? General activity 5 Relation with others 0 Enjoyment of life 0 What TIME of day is your pain at its worst? morning , daytime, evening, and night Sleep (in general) Fair  Pain is worse with: walking, bending, and some activites Pain improves with: rest and medication Relief from Meds: 5  Family History  Problem Relation Age of Onset   Breast cancer Mother    Cancer Mother        Breast   Rheumatic fever Father    Heart disease Father    Heart attack Father        Massive    Diabetes Son    Social History   Socioeconomic History   Marital status: Married    Spouse name: Not on file   Number of children: Not on file   Years of education: Not on file   Highest education level: Not on file  Occupational History   Occupation: Tobacco Visual merchandiser  Tobacco Use   Smoking status: Every Day    Current packs/day: 1.00    Average packs/day: 1 pack/day for 30.0 years (30.0 ttl pk-yrs)    Types: Cigarettes   Smokeless tobacco: Former    Quit date: 06/08/1978   Tobacco comments:    1  pack of cigarettes smoked daily [facility] 02/27/22  Vaping Use   Vaping status: Never Used  Substance  and Sexual Activity   Alcohol use: Yes    Comment: 5 quarts per week 12/26/21   Drug use: No   Sexual activity: Not on file  Other Topics Concern   Not on file  Social History Narrative   Lives in [place] with wife and 2 sons.    Social Determinants of Health   Financial Resource Strain: Not on file  Food Insecurity: Low Risk  (11/24/2022)   Received from [facility]   Hunger Vital Sign    Worried About Running Out of Food in the Last Year: Never true    Ran Out of Food in the Last Year: Never true  Transportation Needs: No Transportation Needs (11/24/2022)   Received from [facility]    In the past 12 months, has lack of reliable transportation kept you from medical appointments, meetings, work or from getting things needed for daily living? : No  Physical Activity: Not on file  Stress: Not on file  Social Connections: Not on file   Past Surgical History:  Procedure Laterality Date   CARDIOVERSION N/A 01/20/2021   Procedure: CARDIOVERSION;  Surgeon: Pricilla Riffle, MD;  Location: [facility] ENDOSCOPY;  Service: Cardiovascular;  Laterality: N/A;   CYSTOSCOPY  WITH URETEROSCOPY Right 08/14/2013   Procedure: CYSTOSCOPY WITH URETEROSCOPY BLADDER BIOPSY ;  Surgeon: Garnett Farm, MD;  Location: [facility];  Service: Urology;  Laterality: Right;   ESOPHAGOGASTRODUODENOSCOPY N/A 02/06/2013   Procedure: ESOPHAGOGASTRODUODENOSCOPY (EGD);  Surgeon: Hart Carwin, MD;  Location: [facility] ENDOSCOPY;  Service: Endoscopy;  Laterality: N/A;   LAPAROSCOPY N/A 11/25/2014   Procedure: LAPAROSCOPIC PRIMARY REPAIR OF PERFORATED PREPYLORIC ULCER WITH Peggye Ley;  Surgeon: Gaynelle Adu, MD;  Location: [facility] OR;  Service: General;  Laterality: N/A;   TONSILLECTOMY  as child   TRANSTHORACIC ECHOCARDIOGRAM  02-17-2011   MODERATE LVH/  EF 65%   TRANSURETHRAL RESECTION OF BLADDER TUMOR WITH GYRUS (TURBT-GYRUS) N/A 06/12/2013   Procedure: TRANSURETHRAL RESECTION OF BLADDER TUMOR WITH GYRUS  (TURBT-GYRUS);  Surgeon: Garnett Farm, MD;  Location: [facility];  Service: Urology;  Laterality: N/A;   TYMPANIC MEMBRANE REPAIR  as child   Past Surgical History:  Procedure Laterality Date   CARDIOVERSION N/A 01/20/2021   Procedure: CARDIOVERSION;  Surgeon: Pricilla Riffle, MD;  Location: [facility] ENDOSCOPY;  Service: Cardiovascular;  Laterality: N/A;   CYSTOSCOPY WITH URETEROSCOPY Right 08/14/2013   Procedure: CYSTOSCOPY WITH URETEROSCOPY BLADDER BIOPSY ;  Surgeon: Garnett Farm, MD;  Location: [facility];  Service: Urology;  Laterality: Right;   ESOPHAGOGASTRODUODENOSCOPY N/A 02/06/2013   Procedure: ESOPHAGOGASTRODUODENOSCOPY (EGD);  Surgeon: Hart Carwin, MD;  Location: [facility] ENDOSCOPY;  Service: Endoscopy;  Laterality: N/A;   LAPAROSCOPY N/A 11/25/2014   Procedure: LAPAROSCOPIC PRIMARY REPAIR OF PERFORATED PREPYLORIC ULCER WITH Peggye Ley;  Surgeon: Gaynelle Adu, MD;  Location: [facility] OR;  Service: General;  Laterality: N/A;   TONSILLECTOMY  as child   TRANSTHORACIC ECHOCARDIOGRAM  02-17-2011   MODERATE LVH/  EF 65%   TRANSURETHRAL RESECTION OF BLADDER TUMOR WITH GYRUS (TURBT-GYRUS) N/A 06/12/2013   Procedure: TRANSURETHRAL RESECTION OF BLADDER TUMOR WITH GYRUS (TURBT-GYRUS);  Surgeon: Garnett Farm, MD;  Location: [facility];  Service: Urology;  Laterality: N/A;   TYMPANIC MEMBRANE REPAIR  as child   Past Medical History:  Diagnosis Date   Arthritis    Bladder cancer (HCC)    Chronic back pain    Chronic combined systolic and diastolic CHF (congestive heart failure) (HCC) 07/16/2021   COPD (chronic obstructive pulmonary disease) (HCC)    DDD (degenerative disc disease)    Diabetic retinopathy of both eyes (HCC)    Essential hypertension    GERD (gastroesophageal reflux disease)    History of atrial flutter 02/07/2011   Converted to NSR with Cardizem   History of chronic bronchitis    History of hemolytic anemia 02/07/2011   secondary to  Avelox   HOH (hard of hearing)    HOH (hard of hearing)    no eardrum and nerve damage on R, also HOH on L   Mild renal insufficiency 08/25/2017   Mitral valve prolapse    a. 2D Echo 11/27/14: EF 55-60%; images were inadequate for LV wall motion assessment, + mild late systolic mitral valve prolapse involving the anterior leaflet.   PAD (peripheral artery disease) (HCC) 04/09/2014   Dr Myra Gianotti; bilateral SFA occlusion, R mid, L distal   PAF (paroxysmal atrial fibrillation) (HCC)    a. Dx 11/2014 during admission for perf ulcer.   Perforated ulcer (HCC)    a. 11/2014 s/p surgery.   Productive cough    Smokers' cough (HCC)    Type 1 diabetes mellitus (HCC) 03/10/1975   BP Marland Kitchen)  103/59   Pulse 87   Ht 5\' 8"  (1.727 m)   Wt 175 lb 9.6 oz (79.7 kg)   SpO2 97% Comment: 3LNC  BMI 26.70 kg/m   Opioid Risk Score:   Fall Risk Score:  `1  Depression screen [facility] 2/9     01/12/2023    1:41 PM 12/28/2022    3:46 PM 11/02/2022    2:33 PM 09/07/2022    3:27 PM 07/20/2022    1:26 PM 04/20/2022    2:52 PM 03/12/2022    2:54 PM  Depression screen PHQ 2/9  Decreased Interest 0 0 0 0 0 0 0  Down, Depressed, Hopeless 0 0 0 0 0 0 0  PHQ - 2 Score 0 0 0 0 0 0 0  Altered sleeping  0  0     Tired, decreased energy  0  0     Change in appetite  0  0     Feeling bad or failure about yourself   0  0     Trouble concentrating  0  0     Moving slowly or fidgety/restless  0  0     Suicidal thoughts  0  0     PHQ-9 Score  0  0     Difficult doing work/chores  Not difficult at all  Not difficult at all        Review of Systems  Constitutional: Negative.   HENT: Negative.    Eyes: Negative.   Respiratory: Negative.    Cardiovascular: Negative.   Gastrointestinal: Negative.   Endocrine: Negative.   Genitourinary: Negative.   Musculoskeletal:  Positive for back pain.  Skin: Negative.   Allergic/Immunologic: Negative.   Neurological: Negative.   Hematological: Negative.   Psychiatric/Behavioral:  Negative.    All other systems reviewed and are negative.      Objective:   Physical Exam General No acute distress Mood and affect appropriate Negative straight leg raising bilaterally Motor strength is 5/5 bilateral hip flexion knee extension ankle dorsiflexion Lumbar spine has reduced left range of motion he has 50% flexion with only 25% extension. Pain is mainly with extension of the lumbar spine.  Sacral thrust (prone) : Positive bilaterally Lateral compression: Negative FABER's: Some hip tightness bilaterally with external rotation but otherwise negative Distraction (supine): Negative Thigh thrust test: Negative FADIR with reduced internal rotation but no pain bilaterally      Assessment & Plan:    Chronic low back pain no significant sciatic, he is not a good candidate for procedures stopping anticoagulation.  Would not recommend epidural steroid injections for both these reasons Patient primarily with chronic axial pain test for SI joint positive x 1, independent review of x-rays demonstrating a mild levoconvex scoliosis multilevel degenerative disc disease as well as L5-S1 facet arthropathy.  This was x-ray from 2016, another 1 from 12/11/2021 performed by Dr. Annell Greening, could not see the actual film Based on exam and symptoms would recommend procedure below.  He is already tried narcotic analgesics for several months without improvements.  He is poor surgical candidate due to multiple medical comorbidities.  He has list limited exercise capacity for PT because of his O2 dependent COPD as well as congestive heart failure. B L3-4-5 MBB we discussed this is a diagnostic test and would not produce a prolonged pain relief.

## 2023-01-14 ENCOUNTER — Ambulatory Visit: Payer: Medicare HMO | Admitting: Emergency Medicine

## 2023-01-17 NOTE — Progress Notes (Unsigned)
Cardiology Office Note:    Date:  01/19/2023   ID:  Thomas Sandoval., DOB [birth year], MRN [id number]  PCP:  Bennie Pierini, FNP  Cardiologist:  Little Ishikawa, MD  Electrophysiologist:  Lanier Prude, MD   Referring MD: Bennie Pierini, *   Chief Complaint  Patient presents with   Atrial Fibrillation    History of Present Illness:    Thomas Sandoval. is a 67 y.o. male with a hx of alcohol abuse, tobacco abuse, COPD, perforated gastric ulcer status postrepair in 2016, T2DM, hypertension, PAD, MVP, atrial fibrillation/flutter, systolic heart failure who presents for follow-up.  He was hospitalized September 2022 with acute pancreatitis found to be in A. fib.  Echo at that time showed EF 30 to 35%.  He left AMA.  At follow-up with his PCP 12/17/2020 he was still in A. fib and started on Xarelto and diltiazem.  He was referred to A. fib clinic, seen on 12/25/2020.  He underwent cardioversion on 01/20/2021 with successful restoration of sinus rhythm.  Echocardiogram 08/26/2017 showed EF 55 to 60%, mild mitral valve prolapse.  Echo 12/05/2020 showed EF 30 to 35%, global hypokinesis, and low normal RV function, moderate left atrial enlargement, trivial MR.  Zio patch x3 days on 02/10/2021 showed high percent A. fib burden with average rate 95 bpm.  Echocardiogram 05/21/2021 showed EF 55 to 60%, grade 2 diastolic dysfunction, normal RV function, no significant valvular disease.  He was admitted 11/2021 after being found unresponsive with significant hypoglycemia and hypotension.  EKG with aVR elevation and diffuse ST depressions, suggesting global ischemia.  EKG normalized with correction of his hypoglycemia.  He was seen by cardiology and outpatient coronary CT was recommended.  Coronary CTA was attempted but he was in A-fib on presentation and CT was canceled.  He was admitted for Tikosyn loading 02/2022.  Converted to sinus rhythm on Tikosyn.  Stress PET on  10/07/2022 showed small reversible perfusion defect in apical to mid inferior wall, mildly abnormal myocardial blood flow reserve (1.78).  Since last clinic visit, he reports he is doing okay.  His Kardia mobile tracings show he was in normal rhythm on 11/8.  He denies any chest pain.  Continues to have some dyspnea on exertion.  He denies any bleeding on Xarelto.  Reports some lightheadedness that generally correlates when his glucose is running low, denies any syncope.  Reports no alcohol use.  He continues to smoke about 1 pack/day.  Wt Readings from Last 3 Encounters:  01/19/23 186 lb (84.4 kg)  01/12/23 175 lb 9.6 oz (79.7 kg)  12/29/22 185 lb 12.8 oz (84.3 kg)    Past Medical History:  Diagnosis Date   Arthritis    Bladder cancer (HCC)    Chronic back pain    Chronic combined systolic and diastolic CHF (congestive heart failure) (HCC) 07/16/2021   COPD (chronic obstructive pulmonary disease) (HCC)    DDD (degenerative disc disease)    Diabetic retinopathy of both eyes (HCC)    Essential hypertension    GERD (gastroesophageal reflux disease)    History of atrial flutter 02/07/2011   Converted to NSR with Cardizem   History of chronic bronchitis    History of hemolytic anemia 02/07/2011   secondary to Avelox   HOH (hard of hearing)    HOH (hard of hearing)    no eardrum and nerve damage on R, also HOH on L   Mild renal insufficiency 08/25/2017   Mitral valve prolapse  a. 2D Echo 11/27/14: EF 55-60%; images were inadequate for LV wall motion assessment, + mild late systolic mitral valve prolapse involving the anterior leaflet.   PAD (peripheral artery disease) (HCC) 04/09/2014   Dr Myra Gianotti; bilateral SFA occlusion, R mid, L distal   PAF (paroxysmal atrial fibrillation) (HCC)    a. Dx 11/2014 during admission for perf ulcer.   Perforated ulcer (HCC)    a. 11/2014 s/p surgery.   Productive cough    Smokers' cough (HCC)    Type 1 diabetes mellitus (HCC) 03/10/1975    Past  Surgical History:  Procedure Laterality Date   CARDIOVERSION N/A 01/20/2021   Procedure: CARDIOVERSION;  Surgeon: Pricilla Riffle, MD;  Location: [facility] ENDOSCOPY;  Service: Cardiovascular;  Laterality: N/A;   CYSTOSCOPY WITH URETEROSCOPY Right 08/14/2013   Procedure: CYSTOSCOPY WITH URETEROSCOPY BLADDER BIOPSY ;  Surgeon: Garnett Farm, MD;  Location: [facility];  Service: Urology;  Laterality: Right;   ESOPHAGOGASTRODUODENOSCOPY N/A 02/06/2013   Procedure: ESOPHAGOGASTRODUODENOSCOPY (EGD);  Surgeon: Hart Carwin, MD;  Location: [facility] ENDOSCOPY;  Service: Endoscopy;  Laterality: N/A;   LAPAROSCOPY N/A 11/25/2014   Procedure: LAPAROSCOPIC PRIMARY REPAIR OF PERFORATED PREPYLORIC ULCER WITH Peggye Ley;  Surgeon: Gaynelle Adu, MD;  Location: [facility] OR;  Service: General;  Laterality: N/A;   TONSILLECTOMY  as child   TRANSTHORACIC ECHOCARDIOGRAM  02-17-2011   MODERATE LVH/  EF 65%   TRANSURETHRAL RESECTION OF BLADDER TUMOR WITH GYRUS (TURBT-GYRUS) N/A 06/12/2013   Procedure: TRANSURETHRAL RESECTION OF BLADDER TUMOR WITH GYRUS (TURBT-GYRUS);  Surgeon: Garnett Farm, MD;  Location: [facility];  Service: Urology;  Laterality: N/A;   TYMPANIC MEMBRANE REPAIR  as child    Current Medications: Current Meds  Medication Sig   albuterol (VENTOLIN HFA) 108 (90 Base) MCG/ACT inhaler Inhale 2 puffs into the lungs every 6 (six) hours as needed for wheezing or shortness of breath.   cetirizine (ZYRTEC) 10 MG tablet Take 10 mg by mouth daily.   dofetilide (TIKOSYN) 250 MCG capsule TAKE 1 CAPSULE BY MOUTH TWICE A DAY   empagliflozin (JARDIANCE) 10 MG TABS tablet TAKE 1 TABLET BY MOUTH DAILY BEFORE BREAKFAST.   empagliflozin (JARDIANCE) 10 MG TABS tablet Take 1 tablet (10 mg total) by mouth daily before breakfast.   famotidine (PEPCID) 20 MG tablet Take 1 tablet (20 mg total) by mouth every morning. Reported on 04/16/2015   Fluticasone-Umeclidin-Vilant (TRELEGY ELLIPTA) 100-62.5-25 MCG/ACT AEPB  INHALE 1 PUFF INTO THE LUNGS daily   furosemide (LASIX) 40 MG tablet TAKE 1 TABLET BY MOUTH DAILY AS NEEDED (WEIGHT GAIN OF 3 LBS OVERNIGHT OR 5 LBS IN A WEEK).   HYDROcodone-acetaminophen (NORCO) 5-325 MG tablet Take 1 tablet by mouth 3 (three) times daily as needed for moderate pain or severe pain.   insulin aspart (NOVOLOG) 100 UNIT/ML injection PER SLIDING SCALE: 190 - 200 = 2 UNITS. 300 AND ABOVE = 7 UNITS.   insulin glargine (LANTUS) 100 UNIT/ML injection INJECT 0.4 MLS (40 UNITS TOTAL) INTO THE SKIN IN THE MORNING.   Insulin Syringe-Needle U-100 (B-D INS SYR ULTRAFINE 1CC/30G) 30G X 1/2" 1 ML MISC Use 4 times a day with insulin Dx E11.9   Insulin Syringes, Disposable, U-100 1 ML MISC Use 4 times a day for insulin injection Dx E11.9   ipratropium-albuterol (DUONEB) 0.5-2.5 (3) MG/3ML SOLN TAKE 3 MLS BY NEBULIZATION EVERY 4 (FOUR) HOURS AS NEEDED.   levalbuterol (XOPENEX) 0.31 MG/3ML nebulizer solution TAKE 3 MLS (0.31 MG TOTAL) BY NEBULIZATION  EVERY 4 (FOUR) HOURS AS NEEDED FOR WHEEZING.   losartan (COZAAR) 50 MG tablet Take 1 tablet (50 mg total) by mouth daily.   metoprolol succinate (TOPROL-XL) 50 MG 24 hr tablet Take 1.5 tablets (75 mg total) by mouth 2 (two) times daily. Take with or immediately following a meal.   naloxone (NARCAN) nasal spray 4 mg/0.1 mL Place 1 spray into the nose as needed (For opiate overdose).   OVER THE COUNTER MEDICATION Take 1-2 tablets by mouth See admin instructions. Super Beets gummies- Chew 1-2 gummies by mouth every day   OXYGEN Inhale 3 L/min into the lungs continuous.   rivaroxaban (XARELTO) 20 MG TABS tablet Take 1 tablet (20 mg total) by mouth daily with supper.     Allergies:   Avelox [moxifloxacin hcl in nacl], Azithromycin, and Bactrim [sulfamethoxazole-trimethoprim]   Social History   Socioeconomic History   Marital status: Married    Spouse name: Not on file   Number of children: Not on file   Years of education: Not on file   Highest  education level: Not on file  Occupational History   Occupation: Tobacco Farmer  Tobacco Use   Smoking status: Every Day    Current packs/day: 1.00    Average packs/day: 1 pack/day for 30.0 years (30.0 ttl pk-yrs)    Types: Cigarettes   Smokeless tobacco: Former    Quit date: 06/08/1978   Tobacco comments:    1  pack of cigarettes smoked daily [facility] 02/27/22  Vaping Use   Vaping status: Never Used  Substance and Sexual Activity   Alcohol use: Yes    Comment: 5 quarts per week 12/26/21   Drug use: No   Sexual activity: Not on file  Other Topics Concern   Not on file  Social History Narrative   Lives in [place] with wife and 2 sons.    Social Determinants of Health   Financial Resource Strain: Not on file  Food Insecurity: Low Risk  (11/24/2022)   Received from [facility]   Hunger Vital Sign    Worried About Running Out of Food in the Last Year: Never true    Ran Out of Food in the Last Year: Never true  Transportation Needs: No Transportation Needs (11/24/2022)   Received from [facility]    In the past 12 months, has lack of reliable transportation kept you from medical appointments, meetings, work or from getting things needed for daily living? : No  Physical Activity: Not on file  Stress: Not on file  Social Connections: Not on file     Family History: The patient's family history includes Breast cancer in his mother; Cancer in his mother; Diabetes in his son; Heart attack in his father; Heart disease in his father; Rheumatic fever in his father.  ROS:   Please see the history of present illness.     All other systems reviewed and are negative.  EKGs/Labs/Other Studies Reviewed:    The following studies were reviewed today:   EKG:   01/19/2023: Atrial flutter, rate 97, QTc 441 09/29/22: AFL, rate 87, Qtc 459 06/24/22: Normal sinus rhythm, rate 60, QTc 452 02/04/22: Atrial fibrillation, rate 104 11/19/2021: Normal sinus rhythm, rate 72, left  axis deviation 08/20/2021: Atrial flutter with variable conduction, rate 87 05/20/21: Normal sinus rhythm, rate 61, left axis deviation, nonspecific T wave flattening, Q waves in leads III, aVF 03/14/21:NSR, ST depression<1 mm in inferior leads and V4-6, rate 61  Recent Labs: 02/11/2022:  B Natriuretic Peptide 886.2 09/07/2022: ALT 7 09/29/2022: Hemoglobin 13.4; Platelets 245 12/29/2022: BUN 17; Creatinine, Ser 1.53; Magnesium 1.9; Potassium 4.0; Sodium 137  Recent Lipid Panel    Component Value Date/Time   CHOL 90 (L) 09/07/2022 1543   TRIG 86 09/07/2022 1543   HDL 30 (L) 09/07/2022 1543   CHOLHDL 3.0 09/07/2022 1543   LDLCALC 43 09/07/2022 1543    Physical Exam:    VS:  BP 136/62 (BP Location: Left Arm, Patient Position: Sitting, Cuff Size: Normal)   Pulse 97   Ht 5\' 8"  (1.727 m)   Wt 186 lb (84.4 kg)   SpO2 98%   BMI 28.28 kg/m     Wt Readings from Last 3 Encounters:  01/19/23 186 lb (84.4 kg)  01/12/23 175 lb 9.6 oz (79.7 kg)  12/29/22 185 lb 12.8 oz (84.3 kg)     GEN:  Well nourished, well developed in no acute distress HEENT: Normal NECK: No JVD; No carotid bruits CARDIAC: RRR, no murmurs, rubs, gallops RESPIRATORY:  Clear to auscultation without rales, wheezing or rhonchi  ABDOMEN: Soft, non-tender, non-distended MUSCULOSKELETAL: 1+ edema; No deformity  SKIN: Warm and dry NEUROLOGIC:  Alert and oriented x 3 PSYCHIATRIC:  Normal affect   ASSESSMENT:    1. Persistent atrial fibrillation (HCC)   2. Chronic combined systolic and diastolic heart failure (HCC)   3. Coronary artery disease involving native coronary artery of native heart, unspecified whether angina present   4. Essential hypertension   5. Tobacco use   6. PAD (peripheral artery disease) (HCC)     PLAN:    Persistent atrial fibrillation/flutter: CHA2DS2-VASc score 4.  Underwent successful cardioversion on 01/20/2021, but was back in A. fib at follow-up appointment on 01/24/2021.  Zio patch x3 days on  02/10/2021 showed 100% percent A. fib burden with average rate 95 bpm.   -Continue Xarelto 20 mg daily -Continue Toprol-XL 75 mg twice daily -Given possible tachycardia induced cardiomyopathy, would recommend rhythm control strategy.  Referred to EP to consider ablation versus antiarrhythmic.  Seen by Dr Lalla Brothers.  Admitted 02/2022 for Tikosyn loading.  Continue Tikosyn 250 mcg twice daily.  He is in atrial flutter in clinic today but from review of his Kardia mobile tracings, he is going in and out of flutter, most recent tracings in sinus rhythm a few days ago.  Continue Tikosyn  Chronic combined systolic and diastolic heart failure: EF 30 to 35% on echocardiogram during admission with acute pancreatitis in September 2022.  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  Echocardiogram 05/21/2021 showed EF 55 to 60% -Continue Lasix 20 mg daily (reports he has been taking 40 mg on MWF).   -Continue Toprol-XL 75 mg BID -Continue losartan 50 mg daily -Continue Jardiance 10 mg daily  CAD: He was admitted 11/2021 after being found unresponsive with significant hypoglycemia and hypotension.  EKG with aVR elevation and diffuse ST depressions, suggesting global ischemia.  EKG normalized with correction of his hypoglycemia.  He was seen by cardiology and outpatient coronary CT was recommended.  He denies any chest pain but having dyspnea on exertion -Recommend coronary CTA to evaluate for obstructive CAD.  Coronary CTA was attempted but he was in A-fib on presentation and was canceled.  Has had CT chest however which shows seveStress PET on 10/07/2022 showed small reversible perfusion defect in apical to mid inferior wall, mildly abnormal myocardial blood flow reserve (1.78), EF reported to drop from 81% at rest to 30% at stress (by process images I got  60% at rest and improved to 64% at stress), overall low risk study -LDL 43 on 09/07/2022  Alcohol abuse: He reports he stopped drinking after starting Tikosyn.   Congratulated patient on quitting and encouraged continued cessation  Tobacco use: Patient counseled on the risk of tobacco use and cessation strongly encouraged.  Hypertension: Continue Toprol-XL, losartan.  Appears controlled  T2DM: On insulin.  A1c 6.4% on 09/07/2022  PAD: ABIs 2019 are severely reduced (right 0.52, left 0.48).  Lower extremity duplex 04/2014 showed bilateral SFA occlusions.  ABIs 02/2022 were 0.36 on right, 0.45 on left.  Follows with vascular surgery   RTC in 3 months  Medication Adjustments/Labs and Tests Ordered: Current medicines are reviewed at length with the patient today.  Concerns regarding medicines are outlined above.  Orders Placed This Encounter  Procedures   EKG 12-Lead    No orders of the defined types were placed in this encounter.   Patient Instructions  Medication Instructions:  Continue current medication *If you need a refill on your cardiac medications before your next appointment, please call your pharmacy*   Lab Work: none If you have labs (blood work) drawn today and your tests are completely normal, you will receive your results only by: MyChart Message (if you have MyChart) OR A paper copy in the mail If you have any lab test that is abnormal or we need to change your treatment, we will call you to review the results.   Testing/Procedures: none   Follow-Up: At [facility], you and your health needs are our priority.  As part of our continuing mission to provide you with exceptional heart care, we have created designated Provider Care Teams.  These Care Teams include your primary Cardiologist (physician) and Advanced Practice Providers (APPs -  Physician Assistants and Nurse Practitioners) who all work together to provide you with the care you need, when you need it.  We recommend signing up for the patient portal called "MyChart".  Sign up information is provided on this After Visit Summary.  MyChart is used to connect  with patients for Virtual Visits (Telemedicine).  Patients are able to view lab/test results, encounter notes, upcoming appointments, etc.  Non-urgent messages can be sent to your provider as well.   To learn more about what you can do with MyChart, go to [email or web address].    Your next appointment:   3 month(s)  Provider:   Little Ishikawa, MD        Signed, Little Ishikawa, MD  01/19/2023 5:33 PM    [facility]

## 2023-01-19 ENCOUNTER — Encounter: Payer: Self-pay | Admitting: Cardiology

## 2023-01-19 ENCOUNTER — Ambulatory Visit: Payer: Medicare HMO | Attending: Cardiology | Admitting: Cardiology

## 2023-01-19 VITALS — BP 136/62 | HR 97 | Ht 68.0 in | Wt 186.0 lb

## 2023-01-19 DIAGNOSIS — I5042 Chronic combined systolic (congestive) and diastolic (congestive) heart failure: Secondary | ICD-10-CM | POA: Diagnosis not present

## 2023-01-19 DIAGNOSIS — I10 Essential (primary) hypertension: Secondary | ICD-10-CM

## 2023-01-19 DIAGNOSIS — Z720 Tobacco use: Secondary | ICD-10-CM | POA: Diagnosis not present

## 2023-01-19 DIAGNOSIS — I739 Peripheral vascular disease, unspecified: Secondary | ICD-10-CM

## 2023-01-19 DIAGNOSIS — I2510 Atherosclerotic heart disease of native coronary artery without angina pectoris: Secondary | ICD-10-CM | POA: Diagnosis not present

## 2023-01-19 DIAGNOSIS — I4819 Other persistent atrial fibrillation: Secondary | ICD-10-CM | POA: Diagnosis not present

## 2023-01-19 NOTE — Patient Instructions (Signed)
Medication Instructions:  Continue current medication *If you need a refill on your cardiac medications before your next appointment, please call your pharmacy*   Lab Work: none If you have labs (blood work) drawn today and your tests are completely normal, you will receive your results only by: MyChart Message (if you have MyChart) OR A paper copy in the mail If you have any lab test that is abnormal or we need to change your treatment, we will call you to review the results.   Testing/Procedures: none   Follow-Up: At [facility], you and your health needs are our priority.  As part of our continuing mission to provide you with exceptional heart care, we have created designated Provider Care Teams.  These Care Teams include your primary Cardiologist (physician) and Advanced Practice Providers (APPs -  Physician Assistants and Nurse Practitioners) who all work together to provide you with the care you need, when you need it.  We recommend signing up for the patient portal called "MyChart".  Sign up information is provided on this After Visit Summary.  MyChart is used to connect with patients for Virtual Visits (Telemedicine).  Patients are able to view lab/test results, encounter notes, upcoming appointments, etc.  Non-urgent messages can be sent to your provider as well.   To learn more about what you can do with MyChart, go to [email or web address].    Your next appointment:   3 month(s)  Provider:   Little Ishikawa, MD

## 2023-01-20 DIAGNOSIS — J9601 Acute respiratory failure with hypoxia: Secondary | ICD-10-CM | POA: Diagnosis not present

## 2023-01-21 DIAGNOSIS — H6592 Unspecified nonsuppurative otitis media, left ear: Secondary | ICD-10-CM | POA: Diagnosis not present

## 2023-01-21 DIAGNOSIS — H93293 Other abnormal auditory perceptions, bilateral: Secondary | ICD-10-CM | POA: Diagnosis not present

## 2023-01-21 DIAGNOSIS — H7291 Unspecified perforation of tympanic membrane, right ear: Secondary | ICD-10-CM | POA: Diagnosis not present

## 2023-01-21 DIAGNOSIS — H9211 Otorrhea, right ear: Secondary | ICD-10-CM | POA: Diagnosis not present

## 2023-02-01 ENCOUNTER — Other Ambulatory Visit: Payer: Self-pay | Admitting: Nurse Practitioner

## 2023-02-01 ENCOUNTER — Other Ambulatory Visit: Payer: Self-pay | Admitting: *Deleted

## 2023-02-01 DIAGNOSIS — E10649 Type 1 diabetes mellitus with hypoglycemia without coma: Secondary | ICD-10-CM

## 2023-02-01 DIAGNOSIS — I70213 Atherosclerosis of native arteries of extremities with intermittent claudication, bilateral legs: Secondary | ICD-10-CM

## 2023-02-02 ENCOUNTER — Encounter: Payer: Self-pay | Admitting: Nurse Practitioner

## 2023-02-02 ENCOUNTER — Ambulatory Visit: Payer: Medicare HMO | Admitting: Nurse Practitioner

## 2023-02-02 VITALS — BP 109/53 | HR 93 | Temp 96.6°F | Ht 68.0 in | Wt 186.0 lb

## 2023-02-02 DIAGNOSIS — J441 Chronic obstructive pulmonary disease with (acute) exacerbation: Secondary | ICD-10-CM | POA: Diagnosis not present

## 2023-02-02 MED ORDER — AMOXICILLIN-POT CLAVULANATE 875-125 MG PO TABS: 1.0000 | ORAL_TABLET | Freq: Two times a day (BID) | ORAL | 0 refills | Status: DC

## 2023-02-02 NOTE — Progress Notes (Signed)
Subjective:    Patient ID: Thomas Ramus., male    DOB: December 10, 1955, 67 y.o.   MRN: [id number]   Chief Complaint: Cough and Nasal Congestion   Cough This is a new problem. The current episode started more than 1 month ago. The problem has been waxing and waning. The problem occurs every few minutes. The cough is Productive of sputum. Associated symptoms include nasal congestion, rhinorrhea and shortness of breath. Pertinent negatives include no ear congestion or sore throat. Nothing aggravates the symptoms. He has tried nothing for the symptoms. The treatment provided mild relief. His past medical history is significant for COPD.    Patient Active Problem List   Diagnosis Date Noted   Chronic SI joint pain 07/20/2022   Spondylosis without myelopathy or radiculopathy, lumbar region 04/20/2022   Encounter for long-term opiate analgesic use 04/20/2022   Chronic respiratory failure with hypoxia (HCC) 02/27/2022   Persistent atrial fibrillation (HCC) 02/17/2022   Encounter for therapeutic drug monitoring 01/19/2022   Chronic pain syndrome 12/22/2021   Tobacco use 12/17/2021   Other intervertebral disc degeneration, lumbar region 12/11/2021   Nodule of left lung 08/18/2021   Demand ischemia (HCC) 08/17/2021   Persistent atrial fibrillation/flutter with rapid ventricular response (HCC) 07/16/2021   Chronic combined systolic and diastolic CHF (congestive heart failure) (HCC) 07/16/2021   Hypercoagulable state due to persistent atrial fibrillation (HCC) 12/25/2020   Alcohol-induced chronic pancreatitis (HCC) 12/17/2020   Delirium tremens (HCC) 12/17/2020   COPD with chronic bronchitis and emphysema (HCC)    Hearing loss 07/26/2019   Chronic back pain 05/02/2019   Mild renal insufficiency 08/25/2017   Prepyloric ulcer 12/12/2014   Essential hypertension 12/07/2014   Mitral valve prolapse 12/06/2014   Atrial fibrillation with RVR (HCC) 11/26/2014   GERD (gastroesophageal reflux  disease) 02/06/2013   Uncontrolled type 1 diabetes mellitus with hypoglycemia, with long-term current use of insulin (HCC) 02/16/2011       Review of Systems  HENT:  Positive for rhinorrhea. Negative for sore throat.   Respiratory:  Positive for cough and shortness of breath.        Objective:   Physical Exam Vitals reviewed.  Constitutional:      Appearance: Normal appearance.  Cardiovascular:     Rate and Rhythm: Normal rate and regular rhythm.     Heart sounds: Normal heart sounds.  Pulmonary:     Effort: Pulmonary effort is normal.     Breath sounds: Wheezing (exp) and rhonchi (throughout) present.  Skin:    General: Skin is warm.  Neurological:     General: No focal deficit present.     Mental Status: He is alert and oriented to person, place, and time.  Psychiatric:        Mood and Affect: Mood normal.        Behavior: Behavior normal.     BP (!) 109/53   Pulse 93   Temp (!) 96.6 F (35.9 C) (Temporal)   Ht 5\' 8"  (1.727 m)   Wt 186 lb (84.4 kg)   SpO2 94% Comment: 3 liters O2  BMI 28.28 kg/m        Assessment & Plan:  Thomas Ramus. in today with chief complaint of Cough and Nasal Congestion   1. COPD with acute exacerbation (HCC) 1. Take meds as prescribed 2. Use a cool mist humidifier especially during the winter months and when heat has been humid. 3. Use saline nose sprays frequently 4. Saline irrigations of  the nose can be very helpful if done frequently.  * 4X daily for 1 week*  * Use of a nettie pot can be helpful with this. Follow directions with this* 5. Drink plenty of fluids 6. Keep thermostat turn down low 7.For any cough or congestion- mucinex 8. For fever or aces or pains- take tylenol or ibuprofen appropriate for age and weight.  * for fevers greater than 101 orally you may alternate ibuprofen and tylenol every  3 hours.    - amoxicillin-clavulanate (AUGMENTIN) 875-125 MG tablet; Take 1 tablet by mouth 2 (two) times daily.   Dispense: 20 tablet; Refill: 0    The above assessment and management plan was discussed with the patient. The patient verbalized understanding of and has agreed to the management plan. Patient is aware to call the clinic if symptoms persist or worsen. Patient is aware when to return to the clinic for a follow-up visit. Patient educated on when it is appropriate to go to the emergency department.   [name] Daphine Deutscher, FNP

## 2023-02-02 NOTE — Patient Instructions (Signed)

## 2023-02-03 ENCOUNTER — Encounter: Payer: Medicare HMO | Admitting: Physical Medicine and Rehabilitation

## 2023-02-03 ENCOUNTER — Encounter: Payer: Self-pay | Admitting: Physical Medicine and Rehabilitation

## 2023-02-03 VITALS — BP 135/73 | HR 92 | Ht 68.0 in | Wt 185.0 lb

## 2023-02-03 DIAGNOSIS — M47816 Spondylosis without myelopathy or radiculopathy, lumbar region: Secondary | ICD-10-CM | POA: Diagnosis not present

## 2023-02-03 DIAGNOSIS — G894 Chronic pain syndrome: Secondary | ICD-10-CM

## 2023-02-03 DIAGNOSIS — Z79891 Long term (current) use of opiate analgesic: Secondary | ICD-10-CM

## 2023-02-03 DIAGNOSIS — Z5181 Encounter for therapeutic drug level monitoring: Secondary | ICD-10-CM | POA: Diagnosis not present

## 2023-02-03 DIAGNOSIS — Z720 Tobacco use: Secondary | ICD-10-CM

## 2023-02-03 NOTE — Patient Instructions (Signed)
I am increasing your norco from three times to four times daily with your next refill. Follow up in 3 months. Keep your appointment with Dr. Wynn Banker.
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
## 2023-02-09 ENCOUNTER — Encounter: Payer: Self-pay | Admitting: Nurse Practitioner

## 2023-02-10 ENCOUNTER — Other Ambulatory Visit (HOSPITAL_COMMUNITY): Payer: Self-pay

## 2023-02-10 MED ORDER — HYDROCODONE-ACETAMINOPHEN 5-325 MG PO TABS
1.0000 | ORAL_TABLET | Freq: Four times a day (QID) | ORAL | 0 refills | Status: AC | PRN
  Filled 2023-02-11: qty 120, 30d supply, fill #0

## 2023-02-10 MED ORDER — HYDROCODONE-ACETAMINOPHEN 5-325 MG PO TABS
1.0000 | ORAL_TABLET | Freq: Four times a day (QID) | ORAL | 0 refills | Status: AC | PRN
  Filled 2023-03-13: qty 120, 30d supply, fill #0

## 2023-02-10 MED ORDER — HYDROCODONE-ACETAMINOPHEN 5-325 MG PO TABS
1.0000 | ORAL_TABLET | Freq: Four times a day (QID) | ORAL | 0 refills | Status: DC | PRN
  Filled 2023-04-14: qty 120, 30d supply, fill #0

## 2023-02-11 ENCOUNTER — Other Ambulatory Visit (HOSPITAL_COMMUNITY): Payer: Self-pay

## 2023-02-15 ENCOUNTER — Ambulatory Visit: Payer: Medicare HMO

## 2023-02-15 ENCOUNTER — Ambulatory Visit (HOSPITAL_COMMUNITY): Payer: Medicare HMO

## 2023-02-17 ENCOUNTER — Encounter: Payer: Self-pay | Admitting: Physician Assistant

## 2023-02-17 NOTE — Progress Notes (Deleted)
   Subjective:    Patient ID: Thomas Sandoval., male    DOB: 09-03-1955, 67 y.o.   MRN: [id number]  HPI   .cpr PROCEDURE RECORD [facility]   Name: Thomas Sandoval. DOB:05-Apr-1955 MRN: [id number]  Date:02/17/2023  Physician: Claudette Laws, MD    Nurse/CMA: Charise Carwin MA  Allergies:  Allergies  Allergen Reactions   Avelox [Moxifloxacin Hcl In Nacl] Other (See Comments)    Hemolysis  In 2012   Azithromycin Other (See Comments) and Nausea And Vomiting    "Severe stomach cramps; told to list as an allergy by dr. Lawrence Marseilles ago"   Bactrim [Sulfamethoxazole-Trimethoprim] Diarrhea and Nausea And Vomiting    Consent Signed: {yes no:314532}  Is patient diabetic? {yes no:314532}  CBG today? ***  Pregnant: {yes no:314532} LMP: No LMP for male patient. (age 27-55)  Anticoagulants: {Yes/No:19989} Anti-inflammatory: {Yes/No:19989} Antibiotics: {Yes/No:19989}  Procedure: Bilateral L3-4-5 Medial Branch Block  Position: Prone Start Time: ***  End Time: ***  Fluoro Time: ***  RN/CMA Thomas Prowell MA Thomas Lutze MA    Time      BP      Pulse      Respirations      O2 Sat      S/S      Pain Level       D/C home with ***, patient A & O X 3, D/C instructions reviewed, and sits independently.        Review of Systems     Objective:   Physical Exam        Assessment & Plan:

## 2023-02-18 DIAGNOSIS — H52223 Regular astigmatism, bilateral: Secondary | ICD-10-CM | POA: Diagnosis not present

## 2023-02-18 DIAGNOSIS — H5203 Hypermetropia, bilateral: Secondary | ICD-10-CM | POA: Diagnosis not present

## 2023-02-18 DIAGNOSIS — E119 Type 2 diabetes mellitus without complications: Secondary | ICD-10-CM | POA: Diagnosis not present

## 2023-02-18 DIAGNOSIS — Z961 Presence of intraocular lens: Secondary | ICD-10-CM | POA: Diagnosis not present

## 2023-02-18 DIAGNOSIS — H26493 Other secondary cataract, bilateral: Secondary | ICD-10-CM | POA: Diagnosis not present

## 2023-02-18 DIAGNOSIS — H524 Presbyopia: Secondary | ICD-10-CM | POA: Diagnosis not present

## 2023-02-18 LAB — HM DIABETES EYE EXAM

## 2023-02-19 ENCOUNTER — Other Ambulatory Visit: Payer: Self-pay | Admitting: Nurse Practitioner

## 2023-02-19 ENCOUNTER — Other Ambulatory Visit (HOSPITAL_COMMUNITY): Payer: Self-pay | Admitting: Physician Assistant

## 2023-02-19 ENCOUNTER — Other Ambulatory Visit: Payer: Self-pay | Admitting: Emergency Medicine

## 2023-02-19 DIAGNOSIS — J449 Chronic obstructive pulmonary disease, unspecified: Secondary | ICD-10-CM

## 2023-02-19 DIAGNOSIS — J9601 Acute respiratory failure with hypoxia: Secondary | ICD-10-CM | POA: Diagnosis not present

## 2023-02-19 DIAGNOSIS — R946 Abnormal results of thyroid function studies: Secondary | ICD-10-CM

## 2023-02-19 DIAGNOSIS — J189 Pneumonia, unspecified organism: Secondary | ICD-10-CM

## 2023-02-19 DIAGNOSIS — K2100 Gastro-esophageal reflux disease with esophagitis, without bleeding: Secondary | ICD-10-CM

## 2023-02-23 ENCOUNTER — Ambulatory Visit: Payer: Self-pay | Admitting: Nurse Practitioner

## 2023-02-23 ENCOUNTER — Telehealth: Payer: Self-pay | Admitting: Family Medicine

## 2023-02-23 DIAGNOSIS — J441 Chronic obstructive pulmonary disease with (acute) exacerbation: Secondary | ICD-10-CM

## 2023-02-23 MED ORDER — AMOXICILLIN-POT CLAVULANATE 875-125 MG PO TABS: 1.0000 | ORAL_TABLET | Freq: Two times a day (BID) | ORAL | 0 refills | Status: DC

## 2023-02-23 NOTE — Telephone Encounter (Signed)
Copied from CRM [id number]. Topic: Clinical - Medication Question >> Feb 23, 2023 10:00 AM Maxwell Marion wrote: Reason for CRM: Pt wants [name]'s nurse to give him a call in regards to a prescription for pneumonia, call him on his cell at [phone]

## 2023-02-23 NOTE — Telephone Encounter (Signed)
Refilled amoxicillin to [facility] per MMM. Patient notified and verbalized understanding

## 2023-02-23 NOTE — Telephone Encounter (Signed)
Duplicate phone message. Please see other encounter

## 2023-02-23 NOTE — Telephone Encounter (Addendum)
Copied from CRM [id number]. Topic: Clinical - Red Word Triage >> Feb 23, 2023  3:26 PM Tiffany H wrote: Red Word that prompted transfer to Nurse Triage: Patient called reporting Pneumonia. Transferred to RN Triage.   Chief Complaint: Breathing difficulty with acute pneumonia Symptoms: Breathing trouble, hoarse voice, productive cough to "dark" brown phlegm, "rattling" at times in breathing, vomiting yesterday, chest pain after coughing on other days, higher blood sugar readings Frequency: Waxing and waning with trouble breathing Pertinent Negatives: Patient denies chest pain today, rattling in breathing currently, vomiting today, blood in emesis or sputum, dizziness Disposition: [] ED /[x] Urgent Care (no appt availability in office) / [] Appointment(In office/virtual)/ []  [facility]/ [] Home Care/ [] Refused Recommended Disposition /[] [facility]/ [x]  Follow-up with PCP Additional Notes: Pt reporting that he has pneumonia on top of his usual COPD, reporting trouble breathing for "2 years, on and off" with pneumonia that "keeps coming back." Pt reporting that he remains at 3 L of at-home oxygen therapy and has not felt the need to increase oxygen in a couple days, but would "add the tank if real bad." Pt requesting prescription for amoxicillin, not wanting to go to hospital or office for appt. Pt reporting that in recent days he's been "worse than was but getting back up to" his normal. Pt reporting that last night he was "debating about whether to go to hospital" for his trouble breathing, "when breathe out, could hear myself rattling, bad hearing, if I can hear it, it's bad." Pt confirms wheezing. Pt reporting that he took 1 leftover pill of amoxicillin 500 mg from another time of illness, took it last night at around 7 pm, "worked pretty well, don't hear rattling, feel better," looking to get more amoxicillin 500 mg, "don't feel like taking a shower, not taken one in 4 days, not looking  to" go to appt because "would need to shower first." Pt also mentioned he was vomiting yesterday starting at 5 am, vomited 3-4x yesterday, didn't eat anything yesterday except for "one saltine cracker" and "drank very little." Pt confirms no blood in emesis. Pt reporting no vomiting today and that he's been keeping food down and has drank "4-5 pints" of fluid today including water and "1 gatorade." Pt confirms that he is urinating "more like usual" amount and "nice clear, light yellow" urine. Pt confirms no chest pain today but has had chest pain after coughing a lot. Pt confirms no dizziness. Pt also reporting that his blood sugar has been a bit higher the past few days, did not give himself a "shot" for sugar control yesterday since vomiting but with "one saltine cracker" pt reporting his blood sugar was 245. Advised pt be examined today to ensure doing okay. Pt refuses ED, pt declines appt today or tomorrow morning because trouble with "back pain" in morning. With pt's consent, called [facility] for connection to triage nurse at PCP office to see about appt or possible prescription. Pt disconnected from call. Triage nurse not available and no availability for appt with PCP office today. Called pt back, advised pt be examined at [facility] tonight. Pt wanting to speak to PCP office, asking why transferred to me, nurse explained, pt hung up phone. Please advise.   Reason for Disposition  [1] Longstanding difficulty breathing (e.g., CHF, COPD, emphysema) AND [2] WORSE than normal  Answer Assessment - Initial Assessment Questions 1. RESPIRATORY STATUS: "Describe your breathing?" (e.g., wheezing, shortness of breath, unable to speak, severe coughing)  Pt reporting that last night he was "debating about whether to go to hospital" for his trouble breathing, "when breathe out, could hear myself rattling, bad hearing, if I can hear it, it's bad." Pt confirms wheezing.  2. ONSET: "When did this breathing problem begin?"       Pt reporting he's been experiencing breathing issues for 2 years on and off but worse in recent days. 3. PATTERN "Does the difficult breathing come and go, or has it been constant since it started?"      Comes and goes, made better with oxygen and "1 pill of amoxicillin" leftover from previous illness 4. SEVERITY: "How bad is your breathing?" (e.g., mild, moderate, severe)    - MILD: No SOB at rest, mild SOB with walking, speaks normally in sentences, can lie down, no retractions, pulse < 100.    - MODERATE: SOB at rest, SOB with minimal exertion and prefers to sit, cannot lie down flat, speaks in phrases, mild retractions, audible wheezing, pulse 100-120.    - SEVERE: Very SOB at rest, speaks in single words, struggling to breathe, sitting hunched forward, retractions, pulse > 120      Pt speaking in clear sentences, seems a little out of breath but speaking in range between phrases and full sentences. No audible wheezing over phone, pt reporting previous rattling sound to breathing. 9. OTHER SYMPTOMS: "Do you have any other symptoms? (e.g., dizziness, runny nose, cough, chest pain, fever)     Pt also mentioned he was vomiting yesterday starting at 5 am, vomited 3-4x yesterday, didn't eat anything yesterday except for "one saltine cracker" and "drank very little." Pt confirms no blood in emesis. Pt confirms no chest pain today but has had chest pain after coughing a lot. Pt confirms no dizziness. Pt also reporting that his blood sugar has been a bit higher the past few days, did not give himself a "shot" for sugar control yesterday since vomiting but with "one saltine cracker" pt reporting his blood sugar was 245. 10. O2 SATURATION MONITOR:  "Do you use an oxygen saturation monitor (pulse oximeter) at home?" If Yes, ask: "What is your reading (oxygen level) today?" "What is your usual oxygen saturation reading?" (e.g., 95%)       Pt unsure of oxygen reading but states that he is on 3 L of at-home oxygen  therapy at this time.  Protocols used: Breathing Difficulty-A-AH

## 2023-02-25 ENCOUNTER — Telehealth: Payer: Self-pay | Admitting: Family Medicine

## 2023-02-25 ENCOUNTER — Encounter: Payer: Medicare HMO | Admitting: Physical Medicine & Rehabilitation

## 2023-02-25 ENCOUNTER — Other Ambulatory Visit: Payer: Self-pay

## 2023-02-25 MED ORDER — AMOXICILLIN 875 MG PO TABS
875.0000 mg | ORAL_TABLET | Freq: Two times a day (BID) | ORAL | 0 refills | Status: AC
Start: 2023-02-25 — End: 2023-03-07

## 2023-02-25 NOTE — Telephone Encounter (Signed)
Called and spoke with patient. He states that he wanted plain amoxicillin instead of augmentin. Sent to pharmacy per patients request. Advised patient to contact the office if he is not some better in a couple days. Patient states that he would and would come in to be seen if no better.

## 2023-02-25 NOTE — Telephone Encounter (Signed)
Copied from CRM [id number]. Topic: General - Other >> Feb 25, 2023  9:32 AM Dennison Nancy wrote: Reason for CRM: patient request for [facility] nurse to call patient soon as possible patient would said what he is calling for  call back # [phone]

## 2023-02-26 ENCOUNTER — Other Ambulatory Visit: Payer: Self-pay

## 2023-02-26 DIAGNOSIS — I4819 Other persistent atrial fibrillation: Secondary | ICD-10-CM

## 2023-02-26 MED ORDER — RIVAROXABAN 20 MG PO TABS: 20.0000 mg | ORAL_TABLET | Freq: Every day | ORAL | 5 refills | Status: DC

## 2023-02-26 NOTE — Telephone Encounter (Signed)
Prescription refill request for Xarelto received.  Indication:aflutter Last office visit:11/24 Weight:83.9  kg Age:67 Scr:1.53  10/24 CrCl:57.12  ml/min  Prescription refilled

## 2023-02-27 ENCOUNTER — Other Ambulatory Visit: Payer: Self-pay | Admitting: Nurse Practitioner

## 2023-02-27 DIAGNOSIS — I5042 Chronic combined systolic (congestive) and diastolic (congestive) heart failure: Secondary | ICD-10-CM

## 2023-03-04 ENCOUNTER — Other Ambulatory Visit: Payer: Self-pay | Admitting: Nurse Practitioner

## 2023-03-04 DIAGNOSIS — E10649 Type 1 diabetes mellitus with hypoglycemia without coma: Secondary | ICD-10-CM

## 2023-03-08 ENCOUNTER — Other Ambulatory Visit (HOSPITAL_COMMUNITY): Payer: Self-pay

## 2023-03-08 ENCOUNTER — Telehealth: Payer: Self-pay

## 2023-03-08 NOTE — Telephone Encounter (Signed)
NEXT REFILL OF Hydrocodone reads:  Note to Pharmacy: Do not fill prior to 1 - 4 - 25       Sig: Take 1 tablet by mouth every 6 (six) hours as needed for moderate pain (pain score 4-6) or severe pain (pain score 7-10). Do not fill prior to 02-11-2023   Patient advised to call the pharmacy to see if an pick up can be done before Sunday 03/13/2023. If not call back.

## 2023-03-11 NOTE — Telephone Encounter (Signed)
 I called the patient  no answer.

## 2023-03-13 ENCOUNTER — Other Ambulatory Visit (HOSPITAL_COMMUNITY): Payer: Self-pay

## 2023-03-16 ENCOUNTER — Ambulatory Visit: Payer: Medicare Other | Admitting: Nurse Practitioner

## 2023-03-16 ENCOUNTER — Encounter: Payer: Self-pay | Admitting: Nurse Practitioner

## 2023-03-16 VITALS — BP 126/69 | HR 78 | Temp 97.0°F | Ht 68.0 in | Wt 185.0 lb

## 2023-03-16 DIAGNOSIS — Z6826 Body mass index (BMI) 26.0-26.9, adult: Secondary | ICD-10-CM

## 2023-03-16 DIAGNOSIS — I5042 Chronic combined systolic (congestive) and diastolic (congestive) heart failure: Secondary | ICD-10-CM

## 2023-03-16 DIAGNOSIS — J4489 Other specified chronic obstructive pulmonary disease: Secondary | ICD-10-CM | POA: Diagnosis not present

## 2023-03-16 DIAGNOSIS — J439 Emphysema, unspecified: Secondary | ICD-10-CM

## 2023-03-16 DIAGNOSIS — N289 Disorder of kidney and ureter, unspecified: Secondary | ICD-10-CM | POA: Diagnosis not present

## 2023-03-16 DIAGNOSIS — G8929 Other chronic pain: Secondary | ICD-10-CM

## 2023-03-16 DIAGNOSIS — I10 Essential (primary) hypertension: Secondary | ICD-10-CM

## 2023-03-16 DIAGNOSIS — K2100 Gastro-esophageal reflux disease with esophagitis, without bleeding: Secondary | ICD-10-CM | POA: Diagnosis not present

## 2023-03-16 DIAGNOSIS — Z720 Tobacco use: Secondary | ICD-10-CM

## 2023-03-16 DIAGNOSIS — I4819 Other persistent atrial fibrillation: Secondary | ICD-10-CM

## 2023-03-16 DIAGNOSIS — K860 Alcohol-induced chronic pancreatitis: Secondary | ICD-10-CM

## 2023-03-16 DIAGNOSIS — E10649 Type 1 diabetes mellitus with hypoglycemia without coma: Secondary | ICD-10-CM | POA: Diagnosis not present

## 2023-03-16 DIAGNOSIS — M545 Low back pain, unspecified: Secondary | ICD-10-CM | POA: Diagnosis not present

## 2023-03-16 LAB — BAYER DCA HB A1C WAIVED: HB A1C (BAYER DCA - WAIVED): 5.7 % — ABNORMAL HIGH (ref 4.8–5.6)

## 2023-03-16 MED ORDER — EMPAGLIFLOZIN 10 MG PO TABS
10.0000 mg | ORAL_TABLET | Freq: Every day | ORAL | Status: DC
Start: 2023-03-16 — End: 2023-08-31

## 2023-03-16 MED ORDER — INSULIN ASPART 100 UNIT/ML IJ SOLN: INTRAMUSCULAR | 1 refills | Status: DC

## 2023-03-16 MED ORDER — FAMOTIDINE 20 MG PO TABS: 20.0000 mg | ORAL_TABLET | Freq: Every morning | ORAL | 0 refills | Status: DC

## 2023-03-16 MED ORDER — EMPAGLIFLOZIN 10 MG PO TABS: 10.0000 mg | ORAL_TABLET | Freq: Every day | ORAL | 1 refills | Status: DC

## 2023-03-16 MED ORDER — INSULIN GLARGINE 100 UNIT/ML ~~LOC~~ SOLN: SUBCUTANEOUS | 0 refills | Status: DC

## 2023-03-16 MED ORDER — LOSARTAN POTASSIUM 50 MG PO TABS: 50.0000 mg | ORAL_TABLET | Freq: Every day | ORAL | 1 refills | Status: DC

## 2023-03-16 MED ORDER — METOPROLOL SUCCINATE ER 50 MG PO TB24: 75.0000 mg | ORAL_TABLET | Freq: Two times a day (BID) | ORAL | 2 refills | Status: DC

## 2023-03-16 NOTE — Progress Notes (Signed)
 Subjective:    Patient ID: Thomas Sandoval., male    DOB: 13-Mar-1955, 68 y.o.   MRN: [id number]   Chief Complaint: medical management of chronic issues     HPI:  Thomas Santagata. is a 68 y.o. who identifies as a male who was assigned male at birth.   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 Essential hypertension No c/o chest pain or headaches- has chronic SOB. BP Readings from Last 3 Encounters:  02/03/23 135/73  02/02/23 (!) 109/53  01/19/23 136/62     2. Persistent atrial fibrillation (HCC) 3. Chronic combined systolic and diastolic CHF (congestive heart failure) (HCC) Last cardiology visit was done on 06/24/22. Review of office note showed no change in plan of care.   4. COPD with chronic bronchitis and emphysema (HCC) Is on oxygen  now but insists on continuing to smoke. He does take oxygen  off now when smoking. Has had inry to face in past when trying to smoke whileon oxygen . He gets pneumonia very easily and has been on antiobiotics almost monthly.  5. Chronic respiratory failure with hypoxia (HCC) Stays SOB without his oxygen   6. Uncontrolled type 1 diabetes mellitus with hypoglycemia, with long-term current use of insulin  [facility]) He eats whatever his wife fixes him. He very seldom checks his blood sugars. Lab Results  Component Value Date   HGBA1C 6.4 (H) 09/07/2022    7. Alcohol-induced chronic pancreatitis (HCC) Says he is no longer drinking alcohol.  8. Mild renal insufficiency No  voiding issues Lab Results  Component Value Date   CREATININE 1.53 (H) 12/29/2022     9. Tobacco use Still smoking  10. BMI 26.0-26.9  Wt Readings from Last 3 Encounters:  03/16/23 185 lb (83.9 kg)  02/03/23 185 lb (83.9 kg)  02/02/23 186 lb (84.4 kg)   BMI Readings from Last 3 Encounters:  03/16/23 28.13 kg/m  02/03/23 28.13  kg/m  02/02/23 28.28 kg/m     New complaints: None today  Allergies  Allergen Reactions   Avelox  [Moxifloxacin  Hcl In Nacl] Other (See Comments)    Hemolysis  In 2012   Azithromycin  Other (See Comments) and Nausea And Vomiting    Severe stomach cramps; told to list as an allergy by dr. Orlinda ago   Bactrim [Sulfamethoxazole -Trimethoprim] Diarrhea and Nausea And Vomiting   Outpatient Encounter Medications as of 03/16/2023  Medication Sig   albuterol  (VENTOLIN  HFA) 108 (90 Base) MCG/ACT inhaler Inhale 2 puffs into the lungs every 6 (six) hours as needed for wheezing or shortness of breath.   amoxicillin -clavulanate (AUGMENTIN ) 875-125 MG tablet Take 1 tablet by mouth 2 (two) times daily.   cetirizine (ZYRTEC) 10 MG tablet Take 10 mg by mouth daily.   dofetilide  (TIKOSYN ) 250 MCG capsule TAKE 1 CAPSULE BY MOUTH TWICE A DAY   empagliflozin  (JARDIANCE ) 10 MG TABS tablet Take 1 tablet (10 mg total) by mouth daily before breakfast.   empagliflozin  (JARDIANCE ) 10 MG TABS tablet TAKE 1 TABLET BY MOUTH EVERY DAY BEFORE BREAKFAST   famotidine  (PEPCID ) 20 MG tablet TAKE 1 TABLET (20 MG TOTAL) BY MOUTH EVERY MORNING. REPORTED ON 04/16/2015   Fluticasone -Umeclidin-Vilant (TRELEGY ELLIPTA ) 100-62.5-25 MCG/ACT AEPB INHALE 1 PUFF INTO THE LUNGS daily   furosemide  (LASIX ) 40 MG tablet TAKE 1 TABLET BY MOUTH DAILY AS NEEDED (WEIGHT GAIN OF 3 LBS OVERNIGHT  OR 5 LBS IN A WEEK).   HYDROcodone -acetaminophen  (NORCO) 5-325 MG tablet Take 1 tablet by mouth every 6 (six) hours as needed for moderate pain (pain score 4-6) or severe pain (pain score 7-10).   [START ON 04/12/2023] HYDROcodone -acetaminophen  (NORCO) 5-325 MG tablet Take 1 tablet by mouth every 6 (six) hours as needed for moderate pain (pain score 4-6) or severe pain (pain score 7-10). Do not fill prior to 02-11-2023   insulin  aspart (NOVOLOG ) 100 UNIT/ML injection PER SLIDING SCALE: 190 - 200 = 2 UNITS. 300 AND ABOVE = 7 UNITS.   insulin  glargine (LANTUS )  100 UNIT/ML injection INJECT 0.4 MLS (40 UNITS TOTAL) INTO THE SKIN IN THE MORNING.   Insulin  Syringe-Needle U-100 (B-D INS SYR ULTRAFINE 1CC/30G) 30G X 1/2 1 ML MISC Use 4 times a day with insulin  Dx E11.9   Insulin  Syringes, Disposable, U-100 1 ML MISC Use 4 times a day for insulin  injection Dx E11.9   ipratropium-albuterol  (DUONEB) 0.5-2.5 (3) MG/3ML SOLN INHALE CONTENTS OF 1 VIAL VIA NEBULIZER EVERY 4 HOURS AS NEEDED   levalbuterol  (XOPENEX ) 0.31 MG/3ML nebulizer solution TAKE 3 MLS (0.31 MG TOTAL) BY NEBULIZATION EVERY 4 (FOUR) HOURS AS NEEDED FOR WHEEZING.   losartan  (COZAAR ) 50 MG tablet Take 1 tablet (50 mg total) by mouth daily.   metoprolol  succinate (TOPROL -XL) 50 MG 24 hr tablet Take 1.5 tablets (75 mg total) by mouth 2 (two) times daily. Take with or immediately following a meal.   naloxone  (NARCAN ) nasal spray 4 mg/0.1 mL Place 1 spray into the nose as needed (For opiate overdose).   OVER THE COUNTER MEDICATION Take 1-2 tablets by mouth See admin instructions. Super Beets gummies- Chew 1-2 gummies by mouth every day   OXYGEN  Inhale 3 L/min into the lungs continuous.   rivaroxaban  (XARELTO ) 20 MG TABS tablet Take 1 tablet (20 mg total) by mouth daily with supper.   No facility-administered encounter medications on file as of 03/16/2023.    Past Surgical History:  Procedure Laterality Date   CARDIOVERSION N/A 01/20/2021   Procedure: CARDIOVERSION;  Surgeon: Okey Vina GAILS, MD;  Location: [facility] ENDOSCOPY;  Service: Cardiovascular;  Laterality: N/A;   CYSTOSCOPY WITH URETEROSCOPY Right 08/14/2013   Procedure: CYSTOSCOPY WITH URETEROSCOPY BLADDER BIOPSY ;  Surgeon: Mark C Ottelin, MD;  Location: [facility];  Service: Urology;  Laterality: Right;   ESOPHAGOGASTRODUODENOSCOPY N/A 02/06/2013   Procedure: ESOPHAGOGASTRODUODENOSCOPY (EGD);  Surgeon: Princella CHRISTELLA Nida, MD;  Location: [facility] ENDOSCOPY;  Service: Endoscopy;  Laterality: N/A;   LAPAROSCOPY N/A 11/25/2014   Procedure:  LAPAROSCOPIC PRIMARY REPAIR OF PERFORATED PREPYLORIC ULCER WITH ARLYSS LISLE;  Surgeon: Camellia Blush, MD;  Location: [facility] OR;  Service: General;  Laterality: N/A;   TONSILLECTOMY  as child   TRANSTHORACIC ECHOCARDIOGRAM  02-17-2011   MODERATE LVH/  EF 65%   TRANSURETHRAL RESECTION OF BLADDER TUMOR WITH GYRUS (TURBT-GYRUS) N/A 06/12/2013   Procedure: TRANSURETHRAL RESECTION OF BLADDER TUMOR WITH GYRUS (TURBT-GYRUS);  Surgeon: Mark C Ottelin, MD;  Location: [facility];  Service: Urology;  Laterality: N/A;   TYMPANIC MEMBRANE REPAIR  as child    Family History  Problem Relation Age of Onset   Breast cancer Mother    Cancer Mother        Breast   Rheumatic fever Father    Heart disease Father    Heart attack Father        Massive    Diabetes Son       Controlled substance  contract: n/a     Review of Systems  Constitutional:  Negative for diaphoresis.  Eyes:  Negative for pain.  Respiratory:  Negative for shortness of breath.   Cardiovascular:  Negative for chest pain, palpitations and leg swelling.  Gastrointestinal:  Negative for abdominal pain.  Endocrine: Negative for polydipsia.  Skin:  Negative for rash.  Neurological:  Negative for dizziness, weakness and headaches.  Hematological:  Does not bruise/bleed easily.  All other systems reviewed and are negative.      Objective:   Physical Exam Vitals and nursing note reviewed.  Constitutional:      Appearance: Normal appearance. He is well-developed.  HENT:     Head: Normocephalic.     Nose: Nose normal.     Mouth/Throat:     Mouth: Mucous membranes are moist.     Pharynx: Oropharynx is clear.  Eyes:     Pupils: Pupils are equal, round, and reactive to light.  Neck:     Thyroid: No thyroid mass or thyromegaly.     Vascular: No carotid bruit or JVD.     Trachea: Phonation normal.  Cardiovascular:     Rate and Rhythm: Normal rate and regular rhythm.  Pulmonary:     Effort: Pulmonary effort is  normal. No respiratory distress.     Breath sounds: Normal breath sounds.  Abdominal:     General: Bowel sounds are normal.     Palpations: Abdomen is soft.     Tenderness: There is no abdominal tenderness.  Musculoskeletal:        General: Normal range of motion.     Cervical back: Normal range of motion and neck supple.  Lymphadenopathy:     Cervical: No cervical adenopathy.  Skin:    General: Skin is warm and dry.  Neurological:     Mental Status: He is alert and oriented to person, place, and time.  Psychiatric:        Behavior: Behavior normal.        Thought Content: Thought content normal.        Judgment: Judgment normal.    BP 126/69   Pulse 78   Temp (!) 97 F (36.1 C) (Temporal)   Ht 5' 8 (1.727 m)   Wt 185 lb (83.9 kg)   SpO2 99% Comment: On 3 liters  BMI 28.13 kg/m     HGBA1c 5.7%     Assessment & Plan:  Thomas Sandoval. comes in today with chief complaint of No chief complaint on file.   Diagnosis and orders addressed:  1. Essential hypertension Low sodium diet - CBC with Differential/Platelet - CMP14+EGFR - losartan  (COZAAR ) 50 MG tablet; Take 1 tablet (50 mg total) by mouth daily.  Dispense: 90 tablet; Refill: 1  2. Persistent atrial fibrillation (HCC) Avoid caffeine - rivaroxaban  (XARELTO ) 20 MG TABS tablet; Take 1 tablet (20 mg total) by mouth daily with supper.  Dispense: 30 tablet; Refill: 5  3. Chronic combined systolic and diastolic CHF (congestive heart failure) (HCC) Continue oxygen   - metoprolol  succinate (TOPROL -XL) 50 MG 24 hr tablet; Take 1.5 tablets (75 mg total) by mouth 2 (two) times daily. Take with or immediately following a meal.  Dispense: 270 tablet; Refill: 2 - furosemide  (LASIX ) 40 MG tablet; TAKE 1 TABLET BY MOUTH DAILY AS NEEDED (WEIGHT GAIN OF 3 LBS OVERNIGHT OR 5 LBS IN A WEEK).  Dispense: 90 tablet; Refill: 1  4. COPD with chronic bronchitis and emphysema (HCC) Stop smoking  5. Chronic  respiratory failure  with hypoxia (HCC)  6. Uncontrolled type 1 diabetes mellitus with hypoglycemia, with long-term current use of insulin  (HCC) Continue to watch carbs in diet - Bayer DCA Hb A1c Waived - Lipid panel - insulin  glargine (LANTUS ) 100 UNIT/ML injection; INJECT 0.4 MLS (40 UNITS TOTAL) INTO THE SKIN IN THE MORNING.  Dispense: 30 mL; Refill: 0  7. Alcohol-induced chronic pancreatitis (HCC) Avoid alcohol  8. Mild renal insufficiency Labs oending  9. Tobacco use Please stop smoking  10. Chronic obstructive pulmonary disease, unspecified (HCC) - Fluticasone -Umeclidin-Vilant (TRELEGY ELLIPTA ) 100-62.5-25 MCG/ACT AEPB; INHALE 1 PUFF INTO THE LUNGS TWICE A DAY  Dispense: 60 each; Refill: 5  11. Gastroesophageal reflux disease with esophagitis without hemorrhage Avoid spicy foods Do not eat 2 hours prior to bedtime - famotidine  (PEPCID ) 20 MG tablet; Take 1 tablet (20 mg total) by mouth every ` morning. Reported on 04/16/2015  Dispense: 90 tablet; Refill: 1  12. Prostate cancer screening Labs pending - PSA, total and free   Labs pending Health Maintenance reviewed Diet and exercise encouraged  Follow up plan: 6 months   [name] Gladis, FNP

## 2023-03-16 NOTE — Patient Instructions (Signed)

## 2023-03-17 LAB — CMP14+EGFR
ALT: 8 [IU]/L (ref 0–44)
AST: 15 [IU]/L (ref 0–40)
Albumin: 4 g/dL (ref 3.9–4.9)
Alkaline Phosphatase: 69 [IU]/L (ref 44–121)
BUN/Creatinine Ratio: 13 (ref 10–24)
BUN: 19 mg/dL (ref 8–27)
Bilirubin Total: 0.3 mg/dL (ref 0.0–1.2)
CO2: 30 mmol/L — ABNORMAL HIGH (ref 20–29)
Calcium: 9.3 mg/dL (ref 8.6–10.2)
Chloride: 99 mmol/L (ref 96–106)
Creatinine, Ser: 1.52 mg/dL — ABNORMAL HIGH (ref 0.76–1.27)
Globulin, Total: 2.6 g/dL (ref 1.5–4.5)
Glucose: 85 mg/dL (ref 70–99)
Potassium: 3.8 mmol/L (ref 3.5–5.2)
Sodium: 141 mmol/L (ref 134–144)
Total Protein: 6.6 g/dL (ref 6.0–8.5)
eGFR: 51 mL/min/{1.73_m2} — ABNORMAL LOW (ref >59)

## 2023-03-17 LAB — CBC WITH DIFFERENTIAL/PLATELET
Basophils Absolute: 0.1 10*3/uL (ref 0.0–0.2)
Basos: 1 %
EOS (ABSOLUTE): 0.3 10*3/uL (ref 0.0–0.4)
Eos: 5 %
Hematocrit: 34.7 % — ABNORMAL LOW (ref 37.5–51.0)
Hemoglobin: 10.9 g/dL — ABNORMAL LOW (ref 13.0–17.7)
Immature Grans (Abs): 0 10*3/uL (ref 0.0–0.1)
Immature Granulocytes: 0 %
Lymphocytes Absolute: 1.3 10*3/uL (ref 0.7–3.1)
Lymphs: 18 %
MCH: 31.2 pg (ref 26.6–33.0)
MCHC: 31.4 g/dL — ABNORMAL LOW (ref 31.5–35.7)
MCV: 99 fL — ABNORMAL HIGH (ref 79–97)
Monocytes Absolute: 0.6 10*3/uL (ref 0.1–0.9)
Monocytes: 8 %
Neutrophils Absolute: 4.9 10*3/uL (ref 1.4–7.0)
Neutrophils: 68 %
Platelets: 245 10*3/uL (ref 150–450)
RBC: 3.49 x10E6/uL — ABNORMAL LOW (ref 4.14–5.80)
RDW: 12.8 % (ref 11.6–15.4)
WBC: 7.1 10*3/uL (ref 3.4–10.8)

## 2023-03-17 LAB — LIPID PANEL
Chol/HDL Ratio: 3.5 {ratio} (ref 0.0–5.0)
Cholesterol, Total: 99 mg/dL — ABNORMAL LOW (ref 100–199)
HDL: 28 mg/dL — ABNORMAL LOW (ref >39)
LDL Chol Calc (NIH): 47 mg/dL (ref 0–99)
Triglycerides: 139 mg/dL (ref 0–149)
VLDL Cholesterol Cal: 24 mg/dL (ref 5–40)

## 2023-03-18 DIAGNOSIS — H4312 Vitreous hemorrhage, left eye: Secondary | ICD-10-CM | POA: Diagnosis not present

## 2023-03-18 DIAGNOSIS — H43813 Vitreous degeneration, bilateral: Secondary | ICD-10-CM | POA: Diagnosis not present

## 2023-03-18 DIAGNOSIS — E113513 Type 2 diabetes mellitus with proliferative diabetic retinopathy with macular edema, bilateral: Secondary | ICD-10-CM | POA: Diagnosis not present

## 2023-03-22 DIAGNOSIS — J9601 Acute respiratory failure with hypoxia: Secondary | ICD-10-CM | POA: Diagnosis not present

## 2023-03-23 ENCOUNTER — Encounter: Payer: Self-pay | Admitting: Emergency Medicine

## 2023-03-23 ENCOUNTER — Ambulatory Visit: Payer: Medicare Other | Admitting: Emergency Medicine

## 2023-03-23 VITALS — BP 114/70 | HR 96 | Ht 68.0 in | Wt 189.6 lb

## 2023-03-23 DIAGNOSIS — J439 Emphysema, unspecified: Secondary | ICD-10-CM

## 2023-03-23 DIAGNOSIS — J9611 Chronic respiratory failure with hypoxia: Secondary | ICD-10-CM

## 2023-03-23 DIAGNOSIS — J4489 Other specified chronic obstructive pulmonary disease: Secondary | ICD-10-CM

## 2023-03-23 DIAGNOSIS — R911 Solitary pulmonary nodule: Secondary | ICD-10-CM | POA: Diagnosis not present

## 2023-03-23 MED ORDER — CEFUROXIME AXETIL 250 MG PO TABS: 250.0000 mg | ORAL_TABLET | Freq: Two times a day (BID) | ORAL | 5 refills | Status: DC

## 2023-03-23 MED ORDER — DOXYCYCLINE HYCLATE 100 MG PO TABS: 100.0000 mg | ORAL_TABLET | Freq: Two times a day (BID) | ORAL | 5 refills | Status: DC

## 2023-03-23 NOTE — Assessment & Plan Note (Signed)
Continue 3L/min 

## 2023-03-23 NOTE — Assessment & Plan Note (Signed)
 Smaller on serial imaging.  Plan repeat scan August 2025

## 2023-03-23 NOTE — Progress Notes (Signed)
 Subjective:    Patient ID: Thomas Sandoval., male    DOB: 1955/11/29, 68 y.o.   MRN: [id number]  HPI  ROV 03/23/2023 --68 year old man with history of active heavy tobacco use, associated severe COPD with chronic bronchitic symptoms.  Also with pulmonary nodular disease and bronchiectasis which we have been following with serial imaging.  He has a history of atrial fibrillation, hypertension and diastolic CHF.  We have been managing him on Trelegy, Xopenex  and DuoNeb as needed but he actually uses DuoNeb on a schedule 4 times a day. Also does some xopenex  nebs in between. Quite limited by both breathing and back pain.   He has formerly been on rotating abx >> Doxycycline  100 mg twice daily for 7 days for the first week of month. Cefuroxime  250 mg twice daily for 7 days for the first week of the next month  CT scan of the chest 11/04/2022 reviewed by me, shows emphysema with diffuse bronchial wall thickening and cylindrical bronchiectasis especially on the right.  There is some associated scattered tree-in-bud nodularity in the right lung.  There is a solid left upper lobe nodule 1.3 x 1.3 x 1.0 cm that is smaller than his prior scan 04/2022   Review of Systems As per HPI  Past Medical History:  Diagnosis Date   Arthritis    Bladder cancer (HCC)    Chronic back pain    Chronic combined systolic and diastolic CHF (congestive heart failure) (HCC) 07/16/2021   COPD (chronic obstructive pulmonary disease) (HCC)    COPD with chronic bronchitis and emphysema (HCC)    DDD (degenerative disc disease)    Diabetic retinopathy of both eyes (HCC)    Essential hypertension    GERD (gastroesophageal reflux disease)    History of atrial flutter 02/07/2011   Converted to NSR with Cardizem    History of chronic bronchitis    History of hemolytic anemia 02/07/2011   secondary to Avelox    HOH (hard of hearing)    HOH (hard of hearing)    no eardrum and nerve damage on R, also HOH on L   Mild renal  insufficiency 08/25/2017   Mitral valve prolapse    a. 2D Echo 11/27/14: EF 55-60%; images were inadequate for LV wall motion assessment, + mild late systolic mitral valve prolapse involving the anterior leaflet.   PAD (peripheral artery disease) (HCC) 04/09/2014   Dr Serene; bilateral SFA occlusion, R mid, L distal   PAF (paroxysmal atrial fibrillation) (HCC)    a. Dx 11/2014 during admission for perf ulcer.   Perforated ulcer (HCC)    a. 11/2014 s/p surgery.   Productive cough    Smokers' cough (HCC)    Type 1 diabetes mellitus (HCC) 03/10/1975     Family History  Problem Relation Age of Onset   Breast cancer Mother    Cancer Mother        Breast   Rheumatic fever Father    Heart disease Father    Heart attack Father        Massive    Diabetes Son      Social History   Socioeconomic History   Marital status: Married    Spouse name: Not on file   Number of children: Not on file   Years of education: Not on file   Highest education level: Not on file  Occupational History   Occupation: Tobacco Farmer  Tobacco Use   Smoking status: Every Day    Current packs/day:  0.50    Average packs/day: 0.5 packs/day for 30.0 years (15.0 ttl pk-yrs)    Types: Cigarettes   Smokeless tobacco: Former    Quit date: 06/08/1978   Tobacco comments:    1/2 pk 03/23/23 NM, CMA   Vaping Use   Vaping status: Never Used  Substance and Sexual Activity   Alcohol use: Yes    Comment: 5 quarts per week 12/26/21   Drug use: No   Sexual activity: Not on file  Other Topics Concern   Not on file  Social History Narrative   Lives in [place] with wife and 2 sons.    Social Drivers of Corporate Investment Banker Strain: Not on file  Food Insecurity: Low Risk  (11/24/2022)   Received from [facility]   Hunger Vital Sign    Worried About Running Out of Food in the Last Year: Never true    Ran Out of Food in the Last Year: Never true  Transportation Needs: No Transportation Needs (11/24/2022)    Received from [facility]    In the past 12 months, has lack of reliable transportation kept you from medical appointments, meetings, work or from getting things needed for daily living? : No  Physical Activity: Not on file  Stress: Not on file  Social Connections: Not on file  Intimate Partner Violence: Patient Declined (11/27/2021)   Humiliation, Afraid, Rape, and Kick questionnaire    Fear of Current or Ex-Partner: Patient declined    Emotionally Abused: Patient declined    Physically Abused: Patient declined    Sexually Abused: Patient declined     Allergies  Allergen Reactions   Avelox  [Moxifloxacin  Hcl In Nacl] Other (See Comments)    Hemolysis  In 2012   Azithromycin  Other (See Comments) and Nausea And Vomiting    Severe stomach cramps; told to list as an allergy by dr. Orlinda ago   Bactrim [Sulfamethoxazole -Trimethoprim] Diarrhea and Nausea And Vomiting     Outpatient Medications Prior to Visit  Medication Sig Dispense Refill   albuterol  (VENTOLIN  HFA) 108 (90 Base) MCG/ACT inhaler Inhale 2 puffs into the lungs every 6 (six) hours as needed for wheezing or shortness of breath. 18 g 2   cetirizine (ZYRTEC) 10 MG tablet Take 10 mg by mouth daily.     dofetilide  (TIKOSYN ) 250 MCG capsule TAKE 1 CAPSULE BY MOUTH TWICE A DAY 180 capsule 1   empagliflozin  (JARDIANCE ) 10 MG TABS tablet Take 1 tablet (10 mg total) by mouth daily. 90 tablet 1   empagliflozin  (JARDIANCE ) 10 MG TABS tablet Take 1 tablet (10 mg total) by mouth daily before breakfast.     famotidine  (PEPCID ) 20 MG tablet Take 1 tablet (20 mg total) by mouth every morning. Reported on 04/16/2015 90 tablet 0   Fluticasone -Umeclidin-Vilant (TRELEGY ELLIPTA ) 100-62.5-25 MCG/ACT AEPB INHALE 1 PUFF INTO THE LUNGS daily 180 each 3   furosemide  (LASIX ) 40 MG tablet TAKE 1 TABLET BY MOUTH DAILY AS NEEDED (WEIGHT GAIN OF 3 LBS OVERNIGHT OR 5 LBS IN A WEEK). 90 tablet 1   HYDROcodone -acetaminophen  (NORCO) 5-325 MG  tablet Take 1 tablet by mouth every 6 (six) hours as needed for moderate pain (pain score 4-6) or severe pain (pain score 7-10). 120 tablet 0   [START ON 04/12/2023] HYDROcodone -acetaminophen  (NORCO) 5-325 MG tablet Take 1 tablet by mouth every 6 (six) hours as needed for moderate pain (pain score 4-6) or severe pain (pain score 7-10). Do not fill prior to 02-11-2023  120 tablet 0   insulin  aspart (NOVOLOG ) 100 UNIT/ML injection PER SLIDING SCALE: 190 - 200 = 2 UNITS. 300 AND ABOVE = 7 UNITS. 10 mL 1   insulin  glargine (LANTUS ) 100 UNIT/ML injection INJECT 0.4 MLS (40 UNITS TOTAL) INTO THE SKIN IN THE MORNING. 30 mL 0   Insulin  Syringe-Needle U-100 (B-D INS SYR ULTRAFINE 1CC/30G) 30G X 1/2 1 ML MISC Use 4 times a day with insulin  Dx E11.9 400 each 3   Insulin  Syringes, Disposable, U-100 1 ML MISC Use 4 times a day for insulin  injection Dx E11.9 400 each 5   ipratropium-albuterol  (DUONEB) 0.5-2.5 (3) MG/3ML SOLN INHALE CONTENTS OF 1 VIAL VIA NEBULIZER EVERY 4 HOURS AS NEEDED 360 mL 5   levalbuterol  (XOPENEX ) 0.31 MG/3ML nebulizer solution TAKE 3 MLS (0.31 MG TOTAL) BY NEBULIZATION EVERY 4 (FOUR) HOURS AS NEEDED FOR WHEEZING. 1080 mL 1   losartan  (COZAAR ) 50 MG tablet Take 1 tablet (50 mg total) by mouth daily. 90 tablet 1   metoprolol  succinate (TOPROL -XL) 50 MG 24 hr tablet Take 1.5 tablets (75 mg total) by mouth 2 (two) times daily. Take with or immediately following a meal. 270 tablet 2   naloxone  (NARCAN ) nasal spray 4 mg/0.1 mL Place 1 spray into the nose as needed (For opiate overdose). 1 each 2   OVER THE COUNTER MEDICATION Take 1-2 tablets by mouth See admin instructions. Super Beets gummies- Chew 1-2 gummies by mouth every day     OXYGEN  Inhale 3 L/min into the lungs continuous.     rivaroxaban  (XARELTO ) 20 MG TABS tablet Take 1 tablet (20 mg total) by mouth daily with supper. 30 tablet 5   No facility-administered medications prior to visit.         Objective:   Physical Exam  Vitals:    03/23/23 1440  BP: 114/70  Pulse: 96  SpO2: 99%  Weight: 189 lb 9.6 oz (86 kg)  Height: 5' 8 (1.727 m)    Gen: Pleasant, well-nourished, in no distress,  normal affect, frequent cough  ENT: No lesions,  mouth clear,  oropharynx clear, no postnasal drip, very poor hearing   Neck: No JVD, no stridor  Lungs: No use of accessory muscles, coarse bilaterally with bilateral rhonchi and expiratory wheezes n  Cardiovascular: RRR, heart sounds normal, no murmur or gallops, no peripheral edema  Musculoskeletal: No deformities, no cyanosis or clubbing  Neuro: alert, awake, non focal  Skin: Warm, no lesions or rash     Assessment & Plan:  COPD with chronic bronchitis and emphysema (HCC) Severe disease with persistent symptoms, waxing and waning bronchitic symptoms.  He continues to smoke.  We talked about the benefits of cessation.  He was doing better when he was on rotating antibiotics and we will reinitiate this.   Continue Trelegy 1 inhalation once daily. Okay to use DuoNeb up to 4 times a day We will restart rotating antibiotics for the first week of every month: -Doxycycline  100 mg twice a day for 7 days -Cefuroxime  250 mg twice a day for 7 days -Then go back to doxycycline  in the next month and rotate  Nodule of left lung Smaller on serial imaging.  Plan repeat scan August 2025  Chronic respiratory failure with hypoxia (HCC) Continue 3 L/min    Lamar Chris, MD, PhD 03/23/2023, 2:56 PM [facility] [phone] or if no answer before 7:00PM call [phone] For any issues after 7:00PM please call eLink [phone]

## 2023-03-23 NOTE — Patient Instructions (Addendum)
 We will plan to repeat your CT scan of the chest in 10/2023 Continue Trelegy 1 inhalation once daily. Okay to use DuoNeb up to 4 times a day We will restart rotating antibiotics for the first week of every month: -Doxycycline  100 mg twice a day for 7 days -Cefuroxime  250 mg twice a day for 7 days -Then go back to doxycycline  in the next month and rotate Continue Zyrtec Continue oxygen  3 L/min Decrease your cigarettes as you are able.  This would help your lung and heart health and your overall functional capacity. Follow with Dr. Shelah in August after your CT chest so we can review those results together.  Please call sooner if you have any problems

## 2023-03-23 NOTE — Assessment & Plan Note (Signed)
 Severe disease with persistent symptoms, waxing and waning bronchitic symptoms.  He continues to smoke.  We talked about the benefits of cessation.  He was doing better when he was on rotating antibiotics and we will reinitiate this.   Continue Trelegy 1 inhalation once daily. Okay to use DuoNeb up to 4 times a day We will restart rotating antibiotics for the first week of every month: -Doxycycline  100 mg twice a day for 7 days -Cefuroxime  250 mg twice a day for 7 days -Then go back to doxycycline  in the next month and rotate

## 2023-03-25 ENCOUNTER — Telehealth: Payer: Self-pay

## 2023-03-25 NOTE — Telephone Encounter (Signed)
Please call patient and see if he is having to do or is doing sliding scale daily.

## 2023-03-25 NOTE — Telephone Encounter (Signed)
Called and spoke with pharmacist and she states that patient picked up rx on 1/8. Patient did not need a PA

## 2023-03-25 NOTE — Telephone Encounter (Signed)
Pharmacy Patient Advocate Encounter  Received notification from [facility] that Prior Authorization for Novolog 100 units/ml injection has been DENIED.  Full denial letter will be uploaded to the media tab. See denial reason below.    *The requested drug is not on the patient's formulary. Consider other options.

## 2023-04-06 ENCOUNTER — Encounter: Payer: Medicare Other | Admitting: Physical Medicine & Rehabilitation

## 2023-04-06 NOTE — Progress Notes (Deleted)
  PROCEDURE RECORD [facility]   Name: Thomas Sandoval. DOB:Jul 09, 1955 MRN: [id number]  Date:04/06/2023  Physician: Claudette Laws, MD    Nurse/CMA: Junelle Hashemi RN  Allergies:  Allergies  Allergen Reactions   Avelox [Moxifloxacin Hcl In Nacl] Other (See Comments)    Hemolysis  In 2012   Azithromycin Other (See Comments) and Nausea And Vomiting    "Severe stomach cramps; told to list as an allergy by dr. Lawrence Marseilles ago"   Bactrim [Sulfamethoxazole-Trimethoprim] Diarrhea and Nausea And Vomiting    Consent Signed: {yes no:314532}  Is patient diabetic? {yes no:314532}  CBG today? ***  Pregnant: {yes no:314532} LMP: No LMP for male patient. (age 67-55)  Anticoagulants: {Yes/No:19989} Anti-inflammatory: {Yes/No:19989} Antibiotics: {Yes/No:19989}  Procedure: bilateral lumbar 3-4-5 medial branch blocks Position: Prone Start Time: ***  End Time: ***  Fluoro Time: ***  RN/CMA Designer, multimedia    Time *** ***    BP *** ***    Pulse *** ***    Respirations *** ***    O2 Sat *** ***    S/S *** ***    Pain Level *** ***     D/C home with ***, patient A & O X 3, D/C instructions reviewed, and sits independently.

## 2023-04-07 ENCOUNTER — Telehealth: Payer: Self-pay | Admitting: Cardiology

## 2023-04-07 DIAGNOSIS — I4819 Other persistent atrial fibrillation: Secondary | ICD-10-CM

## 2023-04-07 MED ORDER — RIVAROXABAN 20 MG PO TABS: 20.0000 mg | ORAL_TABLET | Freq: Every day | ORAL | 1 refills | Status: DC

## 2023-04-07 NOTE — Telephone Encounter (Signed)
*  STAT* If patient is at the pharmacy, call can be transferred to refill team.   1. Which medications need to be refilled? (please list name of each medication and dose if known) rivaroxaban (XARELTO) 20 MG TABS tablet   2. Which pharmacy/location (including street and city if local pharmacy) is medication to be sent to? [facility] - [place] - [address]   3. Do they need a 30 day or 90 day supply? 90

## 2023-04-07 NOTE — Telephone Encounter (Signed)
Prescription refill request for Xarelto received.  Indication: Afib  Last office visit: 12/29/22 Charlean Merl)  Weight: 86kg Age: 68 Scr: 1.52 (03/16/23)  CrCl: 58.44ml/min  Appropriate dose. Refill sent.

## 2023-04-12 ENCOUNTER — Ambulatory Visit (INDEPENDENT_AMBULATORY_CARE_PROVIDER_SITE_OTHER): Payer: Medicare Other | Admitting: Nurse Practitioner

## 2023-04-12 ENCOUNTER — Encounter: Payer: Self-pay | Admitting: Nurse Practitioner

## 2023-04-12 VITALS — BP 115/59 | HR 73 | Temp 97.2°F | Ht 68.0 in | Wt 180.0 lb

## 2023-04-12 DIAGNOSIS — Z0000 Encounter for general adult medical examination without abnormal findings: Secondary | ICD-10-CM | POA: Diagnosis not present

## 2023-04-12 NOTE — Progress Notes (Signed)
Subjective:    Thomas Sandoval. is a 68 y.o. male who presents for a Welcome to Medicare exam.   Cardiac Risk Factors include: advanced age (>77men, >24 women);diabetes mellitus;male gender;smoking/ tobacco exposure;sedentary lifestyle     Objective:    Today's Vitals   04/12/23 1419  BP: (!) 115/59  Pulse: 73  Temp: (!) 97.2 F (36.2 C)  TempSrc: Temporal  SpO2: 99%  Weight: 180 lb (81.6 kg)  Height: 5\' 8"  (1.727 m)   Body mass index is 27.37 kg/m.  Medications Outpatient Encounter Medications as of 04/12/2023  Medication Sig   albuterol (VENTOLIN HFA) 108 (90 Base) MCG/ACT inhaler Inhale 2 puffs into the lungs every 6 (six) hours as needed for wheezing or shortness of breath.   cefUROXime (CEFTIN) 250 MG tablet Take 1 tablet (250 mg total) by mouth 2 (two) times daily with a meal. Take for the first week of rotating months (rotate with doxycycline)   cetirizine (ZYRTEC) 10 MG tablet Take 10 mg by mouth daily.   dofetilide (TIKOSYN) 250 MCG capsule TAKE 1 CAPSULE BY MOUTH TWICE A DAY   doxycycline (VIBRA-TABS) 100 MG tablet Take 1 tablet (100 mg total) by mouth 2 (two) times daily. Take for the first week of alternating months (alternating with cefuroxime)   empagliflozin (JARDIANCE) 10 MG TABS tablet Take 1 tablet (10 mg total) by mouth daily.   empagliflozin (JARDIANCE) 10 MG TABS tablet Take 1 tablet (10 mg total) by mouth daily before breakfast.   famotidine (PEPCID) 20 MG tablet Take 1 tablet (20 mg total) by mouth every morning. Reported on 04/16/2015   Fluticasone-Umeclidin-Vilant (TRELEGY ELLIPTA) 100-62.5-25 MCG/ACT AEPB INHALE 1 PUFF INTO THE LUNGS daily   furosemide (LASIX) 40 MG tablet TAKE 1 TABLET BY MOUTH DAILY AS NEEDED (WEIGHT GAIN OF 3 LBS OVERNIGHT OR 5 LBS IN A WEEK).   HYDROcodone-acetaminophen (NORCO) 5-325 MG tablet Take 1 tablet by mouth every 6 (six) hours as needed for moderate pain (pain score 4-6) or severe pain (pain score 7-10).    HYDROcodone-acetaminophen (NORCO) 5-325 MG tablet Take 1 tablet by mouth every 6 (six) hours as needed for moderate pain (pain score 4-6) or severe pain (pain score 7-10). Do not fill prior to 02-11-2023   insulin aspart (NOVOLOG) 100 UNIT/ML injection PER SLIDING SCALE: 190 - 200 = 2 UNITS. 300 AND ABOVE = 7 UNITS.   insulin glargine (LANTUS) 100 UNIT/ML injection INJECT 0.4 MLS (40 UNITS TOTAL) INTO THE SKIN IN THE MORNING.   Insulin Syringe-Needle U-100 (B-D INS SYR ULTRAFINE 1CC/30G) 30G X 1/2" 1 ML MISC Use 4 times a day with insulin Dx E11.9   Insulin Syringes, Disposable, U-100 1 ML MISC Use 4 times a day for insulin injection Dx E11.9   ipratropium-albuterol (DUONEB) 0.5-2.5 (3) MG/3ML SOLN INHALE CONTENTS OF 1 VIAL VIA NEBULIZER EVERY 4 HOURS AS NEEDED   levalbuterol (XOPENEX) 0.31 MG/3ML nebulizer solution TAKE 3 MLS (0.31 MG TOTAL) BY NEBULIZATION EVERY 4 (FOUR) HOURS AS NEEDED FOR WHEEZING.   losartan (COZAAR) 50 MG tablet Take 1 tablet (50 mg total) by mouth daily.   metoprolol succinate (TOPROL-XL) 50 MG 24 hr tablet Take 1.5 tablets (75 mg total) by mouth 2 (two) times daily. Take with or immediately following a meal.   naloxone (NARCAN) nasal spray 4 mg/0.1 mL Place 1 spray into the nose as needed (For opiate overdose).   OVER THE COUNTER MEDICATION Take 1-2 tablets by mouth See admin instructions. Super Beets gummies-  Chew 1-2 gummies by mouth every day   OXYGEN Inhale 3 L/min into the lungs continuous.   rivaroxaban (XARELTO) 20 MG TABS tablet Take 1 tablet (20 mg total) by mouth daily with supper.   No facility-administered encounter medications on file as of 04/12/2023.     History: Past Medical History:  Diagnosis Date   Arthritis    Bladder cancer (HCC)    Chronic back pain    Chronic combined systolic and diastolic CHF (congestive heart failure) (HCC) 07/16/2021   COPD (chronic obstructive pulmonary disease) (HCC)    COPD with chronic bronchitis and emphysema (HCC)     DDD (degenerative disc disease)    Diabetic retinopathy of both eyes (HCC)    Essential hypertension    GERD (gastroesophageal reflux disease)    History of atrial flutter 02/07/2011   Converted to NSR with Cardizem   History of chronic bronchitis    History of hemolytic anemia 02/07/2011   secondary to Avelox   HOH (hard of hearing)    HOH (hard of hearing)    no eardrum and nerve damage on R, also HOH on L   Mild renal insufficiency 08/25/2017   Mitral valve prolapse    a. 2D Echo 11/27/14: EF 55-60%; images were inadequate for LV wall motion assessment, + mild late systolic mitral valve prolapse involving the anterior leaflet.   PAD (peripheral artery disease) (HCC) 04/09/2014   Dr Myra Gianotti; bilateral SFA occlusion, R mid, L distal   PAF (paroxysmal atrial fibrillation) (HCC)    a. Dx 11/2014 during admission for perf ulcer.   Perforated ulcer (HCC)    a. 11/2014 s/p surgery.   Productive cough    Smokers' cough (HCC)    Type 1 diabetes mellitus (HCC) 03/10/1975   Past Surgical History:  Procedure Laterality Date   CARDIOVERSION N/A 01/20/2021   Procedure: CARDIOVERSION;  Surgeon: Pricilla Riffle, MD;  Location[facility] END[facility]COPY;  Service: Cardiovascular;  Laterality: N/A;   CYSTOSCOPY WITH URETEROSCOPY Right 08/14/2013   Procedure: CYSTOSCOPY WITH URETEROSCOPY BLADDER BIOPSY ;  Surgeon: Garnett Farm, MD;  Location: [facility];  Service: Urology;  Laterality: Right;   ESOPHAGOGASTRODUODENOSCOPY N/A 02/06/2013   Procedure: ESOPHAGOGASTRODUODENOSCOPY (EGD);  Surgeon: Hart Carwin, MD;  Location: [facility] ENDOSCOPY;  Service: Endoscopy;  Laterality: N/A;   LAPAROSCOPY N/A 11/25/2014   Procedure: LAPAROSCOPIC PRIMARY REPAIR OF PERFORATED PREPYLORIC ULCER WITH Peggye Ley;  Surgeon: Gaynelle Adu, MD;  Location: [facility] OR;  Service: General;  Laterality: N/A;   TONSILLECTOMY  as child   TRANSTHORACIC ECHOCARDIOGRAM  02-17-2011   MODERATE LVH/  EF 65%   TRANSURETHRAL RESECTION OF BLADDER  TUMOR WITH GYRUS (TURBT-GYRUS) N/A 06/12/2013   Procedure: TRANSURETHRAL RESECTION OF BLADDER TUMOR WITH GYRUS (TURBT-GYRUS);  Surgeon: Garnett Farm, MD;  Location: [facility];  Service: Urology;  Laterality: N/A;   TYMPANIC MEMBRANE REPAIR  as child    Family History  Problem Relation Age of Onset   Breast cancer Mother    Cancer Mother        Breast   Rheumatic fever Father    Heart disease Father    Heart attack Father        Massive    Diabetes Son    Social History   Occupational History   Occupation: Tobacco Visual merchandiser  Tobacco Use   Smoking status: Every Day    Current packs/day: 0.50    Average packs/day: 0.5 packs/day for 30.0 years (15.0 ttl pk-yrs)  Types: Cigarettes   Smokeless tobacco: Former    Quit date: 06/08/1978   Tobacco comments:    1/2 pk 03/23/23 NM, CMA   Vaping Use   Vaping status: Never Used  Substance and Sexual Activity   Alcohol use: Not Currently    Comment: 5 quarts per week 12/26/21   Drug use: No   Sexual activity: Not on file    Tobacco Counseling Ready to quit: Not Answered Counseling given: Not Answered Tobacco comments: 1/2 pk 03/23/23 NM, CMA    Immunizations and Health Maintenance Immunization History  Administered Date(s) Administered   Tdap 07/26/2021   Health Maintenance Due  Topic Date Due   Medicare Annual Wellness (AWV)  Never done   COVID-19 Vaccine (1) Never done   Pneumonia Vaccine 39+ Years old (1 of 2 - PCV) Never done   Hepatitis C Screening  Never done   Zoster Vaccines- Shingrix (1 of 2) Never done   FOOT EXAM  10/25/2021   Diabetic kidney evaluation - Urine ACR  02/03/2023    Activities of Daily Living    04/12/2023    2:24 PM  In your present state of health, do you have any difficulty performing the following activities:  Hearing? 1  Vision? 0  Difficulty concentrating or making decisions? 0  Walking or climbing stairs? 1  Dressing or bathing? 1  Doing errands, shopping? 1  Preparing  Food and eating ? Y  Using the Toilet? N  In the past six months, have you accidently leaked urine? N  Do you have problems with loss of bowel control? N  Managing your Medications? Y  Managing your Finances? Y  Housekeeping or managing your Housekeeping? Y    Physical Exam   Physical Exam (optional), or other factors deemed appropriate based on the beneficiary's medical and social history and current clinical standards.   Advanced Directives: Does Patient Have a Medical Advance Directive?: No Would patient like information on creating a medical advance directive?: No - Patient declined   EKG: not done- reviewed previous      Assessment:    This is a routine wellness  examination for this patient . Welcome to medicare  Vision/Hearing screen No results found.   Goals      DIET - EAT MORE FRUITS AND VEGETABLES     Exercise 150 min/wk Moderate Activity         Depression Screen    02/03/2023    2:22 PM 02/02/2023    3:07 PM 01/12/2023    1:41 PM 12/28/2022    3:46 PM  PHQ 2/9 Scores  PHQ - 2 Score 0 0 0 0  PHQ- 9 Score  0  0     Fall Risk    04/12/2023    2:28 PM  Fall Risk   Falls in the past year? 0    Cognitive Function        04/12/2023    2:28 PM  6CIT Screen  What Year? 0 points  What month? 0 points  What time? 0 points  Count back from 20 0 points  Months in reverse 0 points  Repeat phrase 0 points  Total Score 0 points    Patient Care Team: Bennie Pierini, FNP as PCP - General (Family Medicine) Lanier Prude, MD as PCP - Electrophysiology (Cardiology) Little Ishikawa, MD as PCP - Cardiology (Cardiology)     Plan:   Welcome to medicare  I have personally reviewed and noted the following  in the patient's chart:   Medical and social history Use of alcohol, tobacco or illicit drugs  Current medications and supplements Functional ability and status Nutritional status Physical activity Advanced directives List of  other physicians Hospitalizations, surgeries, and ER visits in previous 12 months Vitals Screenings to include cognitive, depression, and falls Referrals and appointments  In addition, I have reviewed and discussed with patient certain preventive protocols, quality metrics, and best practice recommendations. A written personalized care plan for preventive services as well as general preventive health recommendations were provided to patient.     [name] Daphine Deutscher, [place] 04/12/2023

## 2023-04-14 ENCOUNTER — Other Ambulatory Visit (HOSPITAL_COMMUNITY): Payer: Self-pay

## 2023-04-15 DIAGNOSIS — E113512 Type 2 diabetes mellitus with proliferative diabetic retinopathy with macular edema, left eye: Secondary | ICD-10-CM | POA: Diagnosis not present

## 2023-04-19 ENCOUNTER — Ambulatory Visit: Payer: Medicare Other | Admitting: Physician Assistant

## 2023-04-19 ENCOUNTER — Ambulatory Visit (HOSPITAL_COMMUNITY)
Admission: RE | Admit: 2023-04-19 | Discharge: 2023-04-19 | Disposition: A | Discharge status: Home / Self Care | Payer: Medicare Other | Source: Ambulatory Visit | Attending: Surgery | Admitting: Surgery

## 2023-04-19 VITALS — BP 171/82 | HR 77 | Temp 97.4°F | Resp 24 | Ht 68.0 in | Wt 187.4 lb

## 2023-04-19 DIAGNOSIS — I70213 Atherosclerosis of native arteries of extremities with intermittent claudication, bilateral legs: Secondary | ICD-10-CM

## 2023-04-19 LAB — VAS US ABI WITH/WO TBI

## 2023-04-19 NOTE — Progress Notes (Signed)
 [facility] HISTORY AND PHYSICAL   History of Present Illness:  Patient is a 68 y.o. year old male who presents for evaluation of claudication.  He denies rest pain or new wounds.  He continues to be ambulatory for short distances hindered by COPD and O2 24 hours a day.  He continues to smoke tobacco daily.  He has known Bilateral superficial femoral artery occlusion.   He has chronic edema and wears compression, but is not able to elevate his legs without aggravating his chronic lumbar pain.    Past Medical History:  Diagnosis Date   Arthritis    Bladder cancer (HCC)    Chronic back pain    Chronic combined systolic and diastolic CHF (congestive heart failure) (HCC) 07/16/2021   COPD (chronic obstructive pulmonary disease) (HCC)    COPD with chronic bronchitis and emphysema (HCC)    DDD (degenerative disc disease)    Diabetic retinopathy of both eyes (HCC)    Essential hypertension    GERD (gastroesophageal reflux disease)    History of atrial flutter 02/07/2011   Converted to NSR with Cardizem    History of chronic bronchitis    History of hemolytic anemia 02/07/2011   secondary to Avelox    HOH (hard of hearing)    HOH (hard of hearing)    no eardrum and nerve damage on R, also HOH on L   Mild renal insufficiency 08/25/2017   Mitral valve prolapse    a. 2D Echo 11/27/14: EF 55-60%; images were inadequate for LV wall motion assessment, + mild late systolic mitral valve prolapse involving the anterior leaflet.   PAD (peripheral artery disease) (HCC) 04/09/2014   Dr Charlotte Cookey; bilateral SFA occlusion, R mid, L distal   PAF (paroxysmal atrial fibrillation) (HCC)    a. Dx 11/2014 during admission for perf ulcer.   Perforated ulcer (HCC)    a. 11/2014 s/p surgery.   Productive cough    Smokers' cough (HCC)    Type 1 diabetes mellitus (HCC) 03/10/1975    Past Surgical History:  Procedure Laterality Date   CARDIOVERSION N/A 01/20/2021   Procedure:  CARDIOVERSION;  Surgeon: Elmyra Haggard, MD;  Location: [facility] ENDOSCOPY;  Service: Cardiovascular;  Laterality: N/A;   CYSTOSCOPY WITH URETEROSCOPY Right 08/14/2013   Procedure: CYSTOSCOPY WITH URETEROSCOPY BLADDER BIOPSY ;  Surgeon: Alanson Alliance, MD;  Location: [facility];  Service: Urology;  Laterality: Right;   ESOPHAGOGASTRODUODENOSCOPY N/A 02/06/2013   Procedure: ESOPHAGOGASTRODUODENOSCOPY (EGD);  Surgeon: Pietro Bridegroom, MD;  Location: [facility] ENDOSCOPY;  Service: Endoscopy;  Laterality: N/A;   LAPAROSCOPY N/A 11/25/2014   Procedure: LAPAROSCOPIC PRIMARY REPAIR OF PERFORATED PREPYLORIC ULCER WITH Olevia Bers;  Surgeon: Aldean Hummingbird, MD;  Location: [facility] OR;  Service: General;  Laterality: N/A;   TONSILLECTOMY  as child   TRANSTHORACIC ECHOCARDIOGRAM  02-17-2011   MODERATE LVH/  EF 65%   TRANSURETHRAL RESECTION OF BLADDER TUMOR WITH GYRUS (TURBT-GYRUS) N/A 06/12/2013   Procedure: TRANSURETHRAL RESECTION OF BLADDER TUMOR WITH GYRUS (TURBT-GYRUS);  Surgeon: Mark C Ottelin, MD;  Location: [facility];  Service: Urology;  Laterality: N/A;   TYMPANIC MEMBRANE REPAIR  as child    ROS:   General:  No weight loss, Fever, chills  HEENT: No recent headaches, no nasal bleeding, no visual changes, no sore throat  Neurologic: No dizziness, blackouts, seizures. No recent symptoms of stroke or mini- stroke. No recent episodes of slurred speech, or temporary blindness.  Cardiac: No recent episodes of  chest pain/pressure, no shortness of breath at rest.  No shortness of breath with exertion.  Denies history of atrial fibrillation or irregular heartbeat  Vascular: No history of rest pain in feet.  No history of claudication.  No history of non-healing ulcer, No history of DVT   Pulmonary: positive home oxygen , no productive cough, no hemoptysis,  positive wheezing  Musculoskeletal:  [ ]  Arthritis, [ ]  Low back pain,  [ ]  Joint pain  Hematologic:No history of hypercoagulable state.  No  history of easy bleeding.  No history of anemia  Gastrointestinal: No hematochezia or melena,  No gastroesophageal reflux, no trouble swallowing  Urinary: [ ]  chronic Kidney disease, [ ]  on HD - [ ]  MWF or [ ]  TTHS, [ ]  Burning with urination, [ ]  Frequent urination, [ ]  Difficulty urinating;   Skin: No rashes  Psychological: No history of anxiety,  No history of depression  Social History Social History   Tobacco Use   Smoking status: Every Day    Current packs/day: 0.50    Average packs/day: 0.5 packs/day for 30.0 years (15.0 ttl pk-yrs)    Types: Cigarettes   Smokeless tobacco: Former    Quit date: 06/08/1978   Tobacco comments:    1/2 pk 03/23/23 NM, CMA   Vaping Use   Vaping status: Never Used  Substance Use Topics   Alcohol use: Not Currently    Comment: 5 quarts per week 12/26/21   Drug use: No    Family History Family History  Problem Relation Age of Onset   Breast cancer Mother    Cancer Mother        Breast   Rheumatic fever Father    Heart disease Father    Heart attack Father        Massive    Diabetes Son     Allergies  Allergies  Allergen Reactions   Avelox  [Moxifloxacin  Hcl In Nacl] Other (See Comments)    Hemolysis  In 2012   Azithromycin  Other (See Comments) and Nausea And Vomiting    "Severe stomach cramps; told to list as an allergy by dr. Years ago"   Bactrim [Sulfamethoxazole -Trimethoprim] Diarrhea and Nausea And Vomiting     Current Outpatient Medications  Medication Sig Dispense Refill   albuterol  (VENTOLIN  HFA) 108 (90 Base) MCG/ACT inhaler Inhale 2 puffs into the lungs every 6 (six) hours as needed for wheezing or shortness of breath. 18 g 2   cefUROXime  (CEFTIN ) 250 MG tablet Take 1 tablet (250 mg total) by mouth 2 (two) times daily with a meal. Take for the first week of rotating months (rotate with doxycycline ) (Patient not taking: Reported on 04/19/2023) 14 tablet 5   cetirizine (ZYRTEC) 10 MG tablet Take 10 mg by mouth daily.      dofetilide  (TIKOSYN ) 250 MCG capsule TAKE 1 CAPSULE BY MOUTH TWICE A DAY 180 capsule 1   doxycycline  (VIBRA -TABS) 100 MG tablet Take 1 tablet (100 mg total) by mouth 2 (two) times daily. Take for the first week of alternating months (alternating with cefuroxime ) (Patient not taking: Reported on 04/19/2023) 14 tablet 5   empagliflozin  (JARDIANCE ) 10 MG TABS tablet Take 1 tablet (10 mg total) by mouth daily. 90 tablet 1   empagliflozin  (JARDIANCE ) 10 MG TABS tablet Take 1 tablet (10 mg total) by mouth daily before breakfast.     famotidine  (PEPCID ) 20 MG tablet Take 1 tablet (20 mg total) by mouth every morning. Reported on 04/16/2015 90 tablet 0  Fluticasone -Umeclidin-Vilant (TRELEGY ELLIPTA ) 100-62.5-25 MCG/ACT AEPB INHALE 1 PUFF INTO THE LUNGS daily 180 each 3   furosemide  (LASIX ) 40 MG tablet TAKE 1 TABLET BY MOUTH DAILY AS NEEDED (WEIGHT GAIN OF 3 LBS OVERNIGHT OR 5 LBS IN A WEEK). 90 tablet 1   HYDROcodone -acetaminophen  (NORCO) 5-325 MG tablet Take 1 tablet by mouth every 6 (six) hours as needed for moderate pain (pain score 4-6) or severe pain (pain score 7-10). Do not fill prior to 02-11-2023 120 tablet 0   insulin  aspart (NOVOLOG ) 100 UNIT/ML injection PER SLIDING SCALE: 190 - 200 = 2 UNITS. 300 AND ABOVE = 7 UNITS. 10 mL 1   insulin  glargine (LANTUS ) 100 UNIT/ML injection INJECT 0.4 MLS (40 UNITS TOTAL) INTO THE SKIN IN THE MORNING. 30 mL 0   Insulin  Syringe-Needle U-100 (B-D INS SYR ULTRAFINE 1CC/30G) 30G X 1/2" 1 ML MISC Use 4 times a day with insulin  Dx E11.9 400 each 3   Insulin  Syringes, Disposable, U-100 1 ML MISC Use 4 times a day for insulin  injection Dx E11.9 400 each 5   ipratropium-albuterol  (DUONEB) 0.5-2.5 (3) MG/3ML SOLN INHALE CONTENTS OF 1 VIAL VIA NEBULIZER EVERY 4 HOURS AS NEEDED 360 mL 5   levalbuterol  (XOPENEX ) 0.31 MG/3ML nebulizer solution TAKE 3 MLS (0.31 MG TOTAL) BY NEBULIZATION EVERY 4 (FOUR) HOURS AS NEEDED FOR WHEEZING. 1080 mL 1   losartan  (COZAAR ) 50 MG tablet Take 1  tablet (50 mg total) by mouth daily. 90 tablet 1   metoprolol  succinate (TOPROL -XL) 50 MG 24 hr tablet Take 1.5 tablets (75 mg total) by mouth 2 (two) times daily. Take with or immediately following a meal. 270 tablet 2   naloxone  (NARCAN ) nasal spray 4 mg/0.1 mL Place 1 spray into the nose as needed (For opiate overdose). 1 each 2   OVER THE COUNTER MEDICATION Take 1-2 tablets by mouth See admin instructions. Super Beets gummies- Chew 1-2 gummies by mouth every day     OXYGEN  Inhale 3 L/min into the lungs continuous.     rivaroxaban  (XARELTO ) 20 MG TABS tablet Take 1 tablet (20 mg total) by mouth daily with supper. 90 tablet 1   No current facility-administered medications for this visit.    Physical Examination  Vitals:   04/19/23 1515  BP: (!) 171/82  Pulse: 77  Resp: (!) 24  Temp: (!) 97.4 F (36.3 C)  SpO2: 99%  Weight: 187 lb 6.4 oz (85 kg)  Height: 5\' 8"  (1.727 m)    Body mass index is 28.49 kg/m.  General:  Alert and oriented, no acute distress HEENT: Normal Neck: No bruit or JVD Pulmonary: Clear to auscultation bilaterally Cardiac: Regular Rate and Rhythm without murmur Abdomen: Soft, non-tender, non-distended, no mass, no scars Skin: No rash Extremity Pulses:  2+ radial, no pedal pulses, skin is warm without ischemic changes Musculoskeletal: No deformity or edema  Neurologic: Upper and lower extremity motor grossly intact and symmetric  DATA:  ABI Findings:  +---------+------------------+-----+----------+--------+  Right   Rt Pressure (mmHg)IndexWaveform  Comment   +---------+------------------+-----+----------+--------+  Brachial 145                                        +---------+------------------+-----+----------+--------+  PTA     52                0.36 monophasic          +---------+------------------+-----+----------+--------+  DP  monophasicCNO        +---------+------------------+-----+----------+--------+  Great Toe49                0.34 Abnormal            +---------+------------------+-----+----------+--------+   +---------+------------------+-----+----------+-------+  Left    Lt Pressure (mmHg)IndexWaveform  Comment  +---------+------------------+-----+----------+-------+  Brachial 123                                       +---------+------------------+-----+----------+-------+  PTA     65                0.45 monophasic         +---------+------------------+-----+----------+-------+  DP      58                0.40 monophasic         +---------+------------------+-----+----------+-------+  Great Toe52                0.36 Abnormal           +---------+------------------+-----+----------+-------+   +-------+-----------+-----------+------------+------------+  ABI/TBIToday's ABIToday's TBIPrevious ABIPrevious TBI  +-------+-----------+-----------+------------+------------+  Right 0.36       0.34       0.52        0.36          +-------+-----------+-----------+------------+------------+  Left  0.45       0.36       0.48        0.34          +-------+-----------+-----------+------------+------------+        Arterial wall calcification precludes accurate ankle pressures and ABIs.  Bilateral TBIs appear essentially unchanged. Right ABIs appear decreased.    Summary:  Right: Resting right ankle-brachial index indicates severe right lower  extremity arterial disease. The right toe-brachial index is abnormal.   Left: Resting left ankle-brachial index indicates severe left lower  extremity arterial disease. The left toe-brachial index is abnormal.      ASSESSMENT/PLAN:  PAD with chronic edema He is stable with essentially slight improvement in the with calcified vessels.   He denies rest pain, non healing open wounds and shorter distance claudication.  We discussed  elevation techniques but he is unable to do more and will cont to wear compression socks.    F/U in 1 year if he develops ischemic symptoms he will call our office.  He should continue with his statin and antiplatelet therapy.  I tried to discourage his smoking habit.       Rocky Cipro PA-C [facility] Office: [phone]  MD in clinic [place]

## 2023-04-22 DIAGNOSIS — J9601 Acute respiratory failure with hypoxia: Secondary | ICD-10-CM | POA: Diagnosis not present

## 2023-04-23 ENCOUNTER — Other Ambulatory Visit: Payer: Self-pay

## 2023-04-23 DIAGNOSIS — I70213 Atherosclerosis of native arteries of extremities with intermittent claudication, bilateral legs: Secondary | ICD-10-CM

## 2023-05-04 ENCOUNTER — Other Ambulatory Visit (INDEPENDENT_AMBULATORY_CARE_PROVIDER_SITE_OTHER): Payer: Medicare Other

## 2023-05-04 ENCOUNTER — Telehealth: Payer: Self-pay

## 2023-05-04 ENCOUNTER — Encounter: Payer: Self-pay | Admitting: Physician Assistant

## 2023-05-04 ENCOUNTER — Other Ambulatory Visit: Payer: Self-pay

## 2023-05-04 ENCOUNTER — Ambulatory Visit: Payer: Medicare Other | Admitting: Physician Assistant

## 2023-05-04 VITALS — BP 118/78 | HR 62 | Ht 68.0 in | Wt 181.4 lb

## 2023-05-04 DIAGNOSIS — N289 Disorder of kidney and ureter, unspecified: Secondary | ICD-10-CM

## 2023-05-04 DIAGNOSIS — R195 Other fecal abnormalities: Secondary | ICD-10-CM

## 2023-05-04 DIAGNOSIS — Z7901 Long term (current) use of anticoagulants: Secondary | ICD-10-CM | POA: Diagnosis not present

## 2023-05-04 DIAGNOSIS — I4891 Unspecified atrial fibrillation: Secondary | ICD-10-CM | POA: Diagnosis not present

## 2023-05-04 DIAGNOSIS — I739 Peripheral vascular disease, unspecified: Secondary | ICD-10-CM | POA: Diagnosis not present

## 2023-05-04 DIAGNOSIS — Z9981 Dependence on supplemental oxygen: Secondary | ICD-10-CM | POA: Diagnosis not present

## 2023-05-04 DIAGNOSIS — D509 Iron deficiency anemia, unspecified: Secondary | ICD-10-CM | POA: Diagnosis not present

## 2023-05-04 DIAGNOSIS — J449 Chronic obstructive pulmonary disease, unspecified: Secondary | ICD-10-CM

## 2023-05-04 DIAGNOSIS — N189 Chronic kidney disease, unspecified: Secondary | ICD-10-CM

## 2023-05-04 DIAGNOSIS — J9611 Chronic respiratory failure with hypoxia: Secondary | ICD-10-CM

## 2023-05-04 DIAGNOSIS — E10649 Type 1 diabetes mellitus with hypoglycemia without coma: Secondary | ICD-10-CM

## 2023-05-04 DIAGNOSIS — I4811 Longstanding persistent atrial fibrillation: Secondary | ICD-10-CM | POA: Diagnosis not present

## 2023-05-04 LAB — COMPREHENSIVE METABOLIC PANEL
ALT: 5 U/L (ref 0–53)
AST: 13 U/L (ref 0–37)
Albumin: 3.8 g/dL (ref 3.5–5.2)
Alkaline Phosphatase: 52 U/L (ref 39–117)
BUN: 27 mg/dL — ABNORMAL HIGH (ref 6–23)
CO2: 30 meq/L (ref 19–32)
Calcium: 9 mg/dL (ref 8.4–10.5)
Chloride: 96 meq/L (ref 96–112)
Creatinine, Ser: 1.79 mg/dL — ABNORMAL HIGH (ref 0.40–1.50)
GFR: 39.26 mL/min — ABNORMAL LOW (ref >60.00)
Glucose, Bld: 117 mg/dL — ABNORMAL HIGH (ref 70–99)
Potassium: 4 meq/L (ref 3.5–5.1)
Sodium: 135 meq/L (ref 135–145)
Total Bilirubin: 0.4 mg/dL (ref 0.2–1.2)
Total Protein: 6.7 g/dL (ref 6.0–8.3)

## 2023-05-04 LAB — CBC WITH DIFFERENTIAL/PLATELET
Basophils Absolute: 0.1 10*3/uL (ref 0.0–0.1)
Basophils Relative: 1.2 % (ref 0.0–3.0)
Eosinophils Absolute: 0.3 10*3/uL (ref 0.0–0.7)
Eosinophils Relative: 4.5 % (ref 0.0–5.0)
HCT: 32.5 % — ABNORMAL LOW (ref 39.0–52.0)
Hemoglobin: 10.4 g/dL — ABNORMAL LOW (ref 13.0–17.0)
Lymphocytes Relative: 23.4 % (ref 12.0–46.0)
Lymphs Abs: 1.4 10*3/uL (ref 0.7–4.0)
MCHC: 32.1 g/dL (ref 30.0–36.0)
MCV: 97.5 fL (ref 78.0–100.0)
Monocytes Absolute: 0.6 10*3/uL (ref 0.1–1.0)
Monocytes Relative: 10.7 % (ref 3.0–12.0)
Neutro Abs: 3.6 10*3/uL (ref 1.4–7.7)
Neutrophils Relative %: 60.2 % (ref 43.0–77.0)
Platelets: 253 10*3/uL (ref 150.0–400.0)
RBC: 3.34 Mil/uL — ABNORMAL LOW (ref 4.22–5.81)
RDW: 15 % (ref 11.5–15.5)
WBC: 5.9 10*3/uL (ref 4.0–10.5)

## 2023-05-04 LAB — IBC + FERRITIN
Ferritin: 28.3 ng/mL (ref 22.0–322.0)
Iron: 137 ug/dL (ref 42–165)
Saturation Ratios: 48.2 % (ref 20.0–50.0)
TIBC: 284.2 ug/dL (ref 250.0–450.0)
Transferrin: 203 mg/dL — ABNORMAL LOW (ref 212.0–360.0)

## 2023-05-04 MED ORDER — SUFLAVE 178.7 G PO SOLR: 1.0000 | Freq: Once | ORAL | 0 refills | Status: AC

## 2023-05-04 NOTE — Telephone Encounter (Signed)
 [facility] Pre-operative Risk Assessment     Request for surgical clearance:     Endoscopy Procedure  What type of surgery is being performed?     Endo/ Colon  When is this surgery scheduled?     06/16/23  What type of clearance is required ?   Pharmacy  Are there any medications that need to be held prior to surgery and how long? Xarelto x2 days  Practice name and name of physician performing surgery?      [facility]  What is your office phone and fax number?      Phone- [phone]  Fax- [phone]  Anesthesia type (None, local, MAC, general) ?       MAC   Please route your response to [employer], CMA

## 2023-05-04 NOTE — Telephone Encounter (Signed)
 Pharmacy please advise on holding Xarelto prior to endoscopy and colonoscopy scheduled for 06/16/23. Thank you.

## 2023-05-04 NOTE — Patient Instructions (Signed)
 Your provider has requested that you go to the basement level for lab work before leaving today. Press "B" on the elevator. The lab is located at the first door on the left as you exit the elevator.   You have been scheduled for an endoscopy and colonoscopy. Please follow the written instructions given to you at your visit today.  If you use inhalers (even only as needed), please bring them with you on the day of your procedure.  DO NOT TAKE 7 DAYS PRIOR TO TEST- Trulicity (dulaglutide) Ozempic, Wegovy (semaglutide) Mounjaro (tirzepatide) Bydureon Bcise (exanatide extended release)  DO NOT TAKE 1 DAY PRIOR TO YOUR TEST Rybelsus (semaglutide) Adlyxin (lixisenatide) Victoza (liraglutide) Byetta (exanatide) ___________________________________________________________________________  Thomas Sandoval will receive your bowel preparation through [facility], which ensures the lowest copay and home delivery, with outreach via text or call from an [phone] number. Please respond promptly to avoid rescheduling of your procedure. If you are interested in alternative options or have any questions regarding your prep, please contact them at [phone] ____________________________________________________________________________  Your Provider Has Sent Your Bowel Prep Regimen To [facility]   [facility] will contact you to verify your information and collect your copay, if applicable. Enjoy the comfort of your home while your prescription is mailed to you, FREE of any shipping charges.   [facility] accepts all major insurance benefits and applies discounts & coupons.  Have additional questions?   Chat: [email or web address] Call: [phone] Email: [email or web address] NCPDP: [id number]  How will [facility] contact you?  With a Welcome phone call,  a Welcome text and a checkout link in text form.  Texts you receive from [phone] Are NOT Spam.  *To set up delivery, you must complete the  checkout process via link or speak to one of the patient care representatives. If [facility] is unable to reach you, your prescription may be delayed.  To avoid long hold times on the phone, you may also utilize the secure chat feature on the [facility] website to request that they call you back for transaction completion or to expedite your concerns.

## 2023-05-04 NOTE — Progress Notes (Signed)
 Chief Complaint: Positive Cologuard  HPI:    Thomas Sandoval is a 68 year old male with a past medical history as listed below including bladder cancer, chronic oxygen use, COPD, GERD, chronic combined systolic and diastolic CHF, A-fib on Xarelto (05/21/2021 echo with LVEF 55-60%) and multiple others, who was referred to me by Daphine Deutscher, [name], * for a complaint of ulcerative Cologuard.    2014 patient seen by our service in the hospital for nausea and vomiting with possible coffee-ground emesis by Dr. Russella Dar.  He was scheduled for an EGD.  EGD with moderately severe gastritis and gastric cardia secondary to vomiting, healed gastric ulcer in the antrum.    12/30/2022 Cologuard positive.    03/16/2023 CMP with a creatinine of 1.52 and otherwise normal, CBC with a hemoglobin of 10.9 (13.4 on 09/29/2022).  PCP recommended a daily iron supplement and Hemoccult cards.    Today, the patient presents to clinic accompanied by his wife who assists with history, and tells me that he was unaware of any anemia.  Apparently he has been on iron low daily for the past couple of months.  Does tell me he has not seen any GI bleeding.  Aware of his positive Cologuard test.  Wondering how we are going to do his colonoscopy with him on oxygen.  Also tells me he can go a couple of days without a bowel movement but then he will have 3-4, this has been normal for him and he never feels bloated or abdominal pain.    Does briefly discussed his chronic back pain as well as oxygen use and inability to really exercise.    Denies fever, chills, weight loss, abdominal pain or change in bowel habits.  Past Medical History:  Diagnosis Date   Arthritis    Bladder cancer (HCC)    Chronic back pain    Chronic combined systolic and diastolic CHF (congestive heart failure) (HCC) 07/16/2021   COPD (chronic obstructive pulmonary disease) (HCC)    COPD with chronic bronchitis and emphysema (HCC)    DDD (degenerative disc  disease)    Diabetic retinopathy of both eyes (HCC)    Essential hypertension    GERD (gastroesophageal reflux disease)    History of atrial flutter 02/07/2011   Converted to NSR with Cardizem   History of chronic bronchitis    History of hemolytic anemia 02/07/2011   secondary to Avelox   HOH (hard of hearing)    HOH (hard of hearing)    no eardrum and nerve damage on R, also HOH on L   Mild renal insufficiency 08/25/2017   Mitral valve prolapse    a. 2D Echo 11/27/14: EF 55-60%; images were inadequate for LV wall motion assessment, + mild late systolic mitral valve prolapse involving the anterior leaflet.   PAD (peripheral artery disease) (HCC) 04/09/2014   Dr Myra Gianotti; bilateral SFA occlusion, R mid, L distal   PAF (paroxysmal atrial fibrillation) (HCC)    a. Dx 11/2014 during admission for perf ulcer.   Perforated ulcer (HCC)    a. 11/2014 s/p surgery.   Productive cough    Smokers' cough (HCC)    Type 1 diabetes mellitus (HCC) 03/10/1975    Past Surgical History:  Procedure Laterality Date   CARDIOVERSION N/A 01/20/2021   Procedure: CARDIOVERSION;  Surgeon: Pricilla Riffle, MD;  Location: [facility] ENDOSCOPY;  Service: Cardiovascular;  Laterality: N/A;   CYSTOSCOPY WITH URETEROSCOPY Right 08/14/2013   Procedure: CYSTOSCOPY WITH URETEROSCOPY BLADDER BIOPSY ;  Surgeon: Loraine Leriche  Collier Salina, MD;  Location: [facility];  Service: Urology;  Laterality: Right;   ESOPHAGOGASTRODUODENOSCOPY N/A 02/06/2013   Procedure: ESOPHAGOGASTRODUODENOSCOPY (EGD);  Surgeon: Hart Carwin, MD;  Location: [facility] ENDOSCOPY;  Service: Endoscopy;  Laterality: N/A;   LAPAROSCOPY N/A 11/25/2014   Procedure: LAPAROSCOPIC PRIMARY REPAIR OF PERFORATED PREPYLORIC ULCER WITH Peggye Ley;  Surgeon: Gaynelle Adu, MD;  [facility];  Service: General;  Laterality: N/A;   TONSILLECTOMY  as child   TRANSTHORACIC ECHOCARDIOGRAM  02-17-2011   MODERATE LVH/  EF 65%   TRANSURETHRAL RESECTION OF BLADDER TUMOR WITH GYRUS  (TURBT-GYRUS) N/A 06/12/2013   Procedure: TRANSURETHRAL RESECTION OF BLADDER TUMOR WITH GYRUS (TURBT-GYRUS);  Surgeon: Garnett Farm, MD;  Location[facility];  Service: Urology;  Laterality: N/A;   TYMPANIC MEMBRANE REPAIR  as child    Current Outpatient Medications  Medication Sig Dispense Refill   albuterol (VENTOLIN HFA) 108 (90 Base) MCG/ACT inhaler Inhale 2 puffs into the lungs every 6 (six) hours as needed for wheezing or shortness of breath. 18 g 2   cefUROXime (CEFTIN) 250 MG tablet Take 1 tablet (250 mg total) by mouth 2 (two) times daily with a meal. Take for the first week of rotating months (rotate with doxycycline) (Patient not taking: Reported on 04/19/2023) 14 tablet 5   cetirizine (ZYRTEC) 10 MG tablet Take 10 mg by mouth daily.     dofetilide (TIKOSYN) 250 MCG capsule TAKE 1 CAPSULE BY MOUTH TWICE A DAY 180 capsule 1   doxycycline (VIBRA-TABS) 100 MG tablet Take 1 tablet (100 mg total) by mouth 2 (two) times daily. Take for the first week of alternating months (alternating with cefuroxime) (Patient not taking: Reported on 04/19/2023) 14 tablet 5   empagliflozin (JARDIANCE) 10 MG TABS tablet Take 1 tablet (10 mg total) by mouth daily. 90 tablet 1   empagliflozin (JARDIANCE) 10 MG TABS tablet Take 1 tablet (10 mg total) by mouth daily before breakfast.     famotidine (PEPCID) 20 MG tablet Take 1 tablet (20 mg total) by mouth every morning. Reported on 04/16/2015 90 tablet 0   Fluticasone-Umeclidin-Vilant (TRELEGY ELLIPTA) 100-62.5-25 MCG/ACT AEPB INHALE 1 PUFF INTO THE LUNGS daily 180 each 3   furosemide (LASIX) 40 MG tablet TAKE 1 TABLET BY MOUTH DAILY AS NEEDED (WEIGHT GAIN OF 3 LBS OVERNIGHT OR 5 LBS IN A WEEK). 90 tablet 1   HYDROcodone-acetaminophen (NORCO) 5-325 MG tablet Take 1 tablet by mouth every 6 (six) hours as needed for moderate pain (pain score 4-6) or severe pain (pain score 7-10). Do not fill prior to 02-11-2023 120 tablet 0   insulin aspart (NOVOLOG) 100  UNIT/ML injection PER SLIDING SCALE: 190 - 200 = 2 UNITS. 300 AND ABOVE = 7 UNITS. 10 mL 1   insulin glargine (LANTUS) 100 UNIT/ML injection INJECT 0.4 MLS (40 UNITS TOTAL) INTO THE SKIN IN THE MORNING. 30 mL 0   Insulin Syringe-Needle U-100 (B-D INS SYR ULTRAFINE 1CC/30G) 30G X 1/2" 1 ML MISC Use 4 times a day with insulin Dx E11.9 400 each 3   Insulin Syringes, Disposable, U-100 1 ML MISC Use 4 times a day for insulin injection Dx E11.9 400 each 5   ipratropium-albuterol (DUONEB) 0.5-2.5 (3) MG/3ML SOLN INHALE CONTENTS OF 1 VIAL VIA NEBULIZER EVERY 4 HOURS AS NEEDED 360 mL 5   levalbuterol (XOPENEX) 0.31 MG/3ML nebulizer solution TAKE 3 MLS (0.31 MG TOTAL) BY NEBULIZATION EVERY 4 (FOUR) HOURS AS NEEDED FOR WHEEZING. 1080 mL 1  losartan (COZAAR) 50 MG tablet Take 1 tablet (50 mg total) by mouth daily. 90 tablet 1   metoprolol succinate (TOPROL-XL) 50 MG 24 hr tablet Take 1.5 tablets (75 mg total) by mouth 2 (two) times daily. Take with or immediately following a meal. 270 tablet 2   naloxone (NARCAN) nasal spray 4 mg/0.1 mL Place 1 spray into the nose as needed (For opiate overdose). 1 each 2   OVER THE COUNTER MEDICATION Take 1-2 tablets by mouth See admin instructions. Super Beets gummies- Chew 1-2 gummies by mouth every day     OXYGEN Inhale 3 L/min into the lungs continuous.     rivaroxaban (XARELTO) 20 MG TABS tablet Take 1 tablet (20 mg total) by mouth daily with supper. 90 tablet 1   No current facility-administered medications for this visit.    Allergies as of 05/04/2023 - Review Complete 04/19/2023  Allergen Reaction Noted   Avelox [moxifloxacin hcl in nacl] Other (See Comments) 02/26/2011   Azithromycin Other (See Comments) and Nausea And Vomiting 02/22/2011   Bactrim [sulfamethoxazole-trimethoprim] Diarrhea and Nausea And Vomiting 02/10/2013    Family History  Problem Relation Age of Onset   Breast cancer Mother    Cancer Mother        Breast   Rheumatic fever Father     Heart disease Father    Heart attack Father        Massive    Diabetes Son     Social History   Socioeconomic History   Marital status: Married    Spouse name: Not on file   Number of children: Not on file   Years of education: Not on file   Highest education level: Not on file  Occupational History   Occupation: Tobacco Visual merchandiser  Tobacco Use   Smoking status: Every Day    Current packs/day: 0.50    Average packs/day: 0.5 packs/day for 30.0 years (15.0 ttl pk-yrs)    Types: Cigarettes   Smokeless tobacco: Former    Quit date: 06/08/1978   Tobacco comments:    1/2 pk 03/23/23 NM, CMA   Vaping Use   Vaping status: Never Used  Substance and Sexual Activity   Alcohol use: Not Currently    Comment: 5 quarts per week 12/26/21   Drug use: No   Sexual activity: Not on file  Other Topics Concern   Not on file  Social History Narrative   Lives in [place] with wife and 2 sons.    Social Drivers of Corporate investment banker Strain: Low Risk  (04/12/2023)   Overall Financial Resource Strain (CARDIA)    Difficulty of Paying Living Expenses: Not very hard  Food Insecurity: Low Risk  (11/24/2022)   Received from [facility]   Hunger Vital Sign    Worried About Running Out of Food in the Last Year: Never true    Ran Out of Food in the Last Year: Never true  Transportation Needs: No Transportation Needs (11/24/2022)   Received from [facility]    In the past 12 months, has lack of reliable transportation kept you from medical appointments, meetings, work or from getting things needed for daily living? : No  Physical Activity: Inactive (04/12/2023)   Exercise Vital Sign    Days of Exercise per Week: 0 days    Minutes of Exercise per Session: 0 min  Stress: No Stress Concern Present (04/12/2023)   [employer] of Occupational Health - Occupational Stress Questionnaire  Feeling of Stress : Not at all  Social Connections: Socially Isolated (04/12/2023)   Social  Connection and Isolation Panel [NHANES]    Frequency of Communication with Friends and Family: Once a week    Frequency of Social Gatherings with Friends and Family: Never    Attends Religious Services: Never    Database administrator or Organizations: No    Attends Banker Meetings: Never    Marital Status: Married  Catering manager Violence: Not At Risk (04/12/2023)   Humiliation, Afraid, Rape, and Kick questionnaire    Fear of Current or Ex-Partner: No    Emotionally Abused: No    Physically Abused: No    Sexually Abused: No    Review of Systems:    Constitutional: No weight loss, fever or chills Skin: No rash  Cardiovascular: No chest pain Respiratory: +chronic SOB Gastrointestinal: See HPI and otherwise negative Genitourinary: No dysuria  Neurological: No headache, dizziness or syncope Musculoskeletal: No new muscle or joint pain Hematologic: No bleeding  Psychiatric: No history of depression or anxiety   Physical Exam:  Vital signs: BP 118/78   Pulse 62   Ht 5\' 8"  (1.727 m)   Wt 181 lb 6 oz (82.3 kg)   BMI 27.58 kg/m    Constitutional:   Pleasant Caucasian male appears to be in NAD, Well developed, Well nourished, alert and cooperative Head:  Normocephalic and atraumatic. Eyes:   PEERL, EOMI. No icterus. Conjunctiva pink. Ears: +VERY HOH Neck:  Supple Throat: Oral cavity and pharynx without inflammation, swelling or lesion.  Respiratory: Respirations even and unlabored. Lungs clear to auscultation bilaterally.   No wheezes, crackles, or rhonchi. + O2 via nasal cannula Cardiovascular: Normal S1, S2. No MRG. Regular rate and rhythm. No peripheral edema, cyanosis or pallor.  Gastrointestinal:  Soft, nondistended, nontender. No rebound or guarding. Normal bowel sounds. No appreciable masses or hepatomegaly. Rectal:  Not performed.  Msk:  Symmetrical without gross deformities. Without edema, no deformity or joint abnormality.  Neurologic:  Alert and   oriented x4;  grossly normal neurologically.  Skin:   Dry and intact without significant lesions or rashes. Psychiatric: Demonstrates good judgement and reason without abnormal affect or behaviors.  RELEVANT LABS AND IMAGING: CBC    Component Value Date/Time   WBC 7.1 03/16/2023 1436   WBC 10.8 (H) 11/27/2021 1045   RBC 3.49 (L) 03/16/2023 1436   RBC 4.26 11/27/2021 1045   HGB 10.9 (L) 03/16/2023 1436   HCT 34.7 (L) 03/16/2023 1436   PLT 245 03/16/2023 1436   MCV 99 (H) 03/16/2023 1436   MCH 31.2 03/16/2023 1436   MCH 33.8 11/27/2021 1045   MCHC 31.4 (L) 03/16/2023 1436   MCHC 33.0 11/27/2021 1045   RDW 12.8 03/16/2023 1436   LYMPHSABS 1.3 03/16/2023 1436   MONOABS 1.1 (H) 11/27/2021 1045   EOSABS 0.3 03/16/2023 1436   BASOSABS 0.1 03/16/2023 1436    CMP     Component Value Date/Time   NA 141 03/16/2023 1436   K 3.8 03/16/2023 1436   CL 99 03/16/2023 1436   CO2 30 (H) 03/16/2023 1436   GLUCOSE 85 03/16/2023 1436   GLUCOSE 105 (H) 12/29/2022 1340   BUN 19 03/16/2023 1436   CREATININE 1.52 (H) 03/16/2023 1436   CREATININE 0.80 12/07/2014 1643   CALCIUM 9.3 03/16/2023 1436   PROT 6.6 03/16/2023 1436   ALBUMIN 4.0 03/16/2023 1436   AST 15 03/16/2023 1436   ALT 8 03/16/2023 1436  ALKPHOS 69 03/16/2023 1436   BILITOT 0.3 03/16/2023 1436   GFRNONAA 50 (L) 12/29/2022 1340   GFRAA 72 05/01/2020 1507    Assessment: 1.  Positive Cologuard: Recently positive Cologuard, no prior colonoscopy 2.  IDA: Anemia noted recently on labs, patient has been on iron over the past couple of months, will recheck today and also check iron studies; consider relation to CKD +/- GI etiology 3.  CKD: To be contributing to above 4.  COPD on chronic oxygen 5.  A-fib on Xarelto 6.  PAD  Plan: 1.  Scheduled patient for diagnostic EGD and colonoscopy at the hospital given his chronic oxygen use due to positive Cologuard and iron deficiency anemia.  Did provide the patient a detailed list of  risks for the procedures and he agrees to proceed.  Patient is high risk given his multiple comorbidities. 2.  Patient advised to hold his Xarelto for 2 days prior to time of procedure.  We will communicate with his prescribing physician to ensure this is acceptable for him. 3.  Repeat labs today including CBC, CMP and iron studies 4.  Discussed positive Cologuard results and what this means. 5.  Patient to follow clinic with [place] per recommendations after time of procedures.  Hyacinth Meeker, PA-C [facility] 05/04/2023, 2:23 PM  Cc: Bennie Pierini, *

## 2023-05-05 NOTE — Telephone Encounter (Signed)
   Patient Name: Thomas Sandoval.  DOB: 10-13-1955 MRN: [id number]  Primary Cardiologist: Little Ishikawa, MD  Clinical pharmacists have reviewed the patient's past medical history, labs, and current medications as part of preoperative protocol coverage. The following recommendations have been made:  Patient with diagnosis of afib on Xarelto for anticoagulation.     Procedure: Endo/ Colonoscopy Date of procedure: 06/16/23     CHA2DS2-VASc Score = 5   This indicates a 7.2% annual risk of stroke. The patient's score is based upon: CHF History: 1 HTN History: 1 Diabetes History: 1 Stroke History: 0 Vascular Disease History: 1 Age Score: 1 Gender Score: 0       CrCl 48 mL/min  Platelet count 253 K     Per office protocol, patient can hold Xarelto for 2 days prior to procedure.  Please resume Xarelto as soon as possible postprocedure, at the discretion of the surgeon.   I will route this recommendation to the requesting party via Epic fax function and remove from pre-op pool.  Please call with questions.  Joylene Grapes, NP 05/05/2023, 4:21 PM

## 2023-05-05 NOTE — Telephone Encounter (Signed)
 Patient with diagnosis of afib on Xarelto for anticoagulation.    Procedure: Endo/ Colonoscopy Date of procedure: 06/16/23   CHA2DS2-VASc Score = 5   This indicates a 7.2% annual risk of stroke. The patient's score is based upon: CHF History: 1 HTN History: 1 Diabetes History: 1 Stroke History: 0 Vascular Disease History: 1 Age Score: 1 Gender Score: 0     CrCl 48 mL/min  Platelet count 253 K   Per office protocol, patient can hold Xarelto for 2 days prior to procedure.     **This guidance is not considered finalized until pre-operative APP has relayed final recommendations.**

## 2023-05-05 NOTE — Progress Notes (Unsigned)
 Cardiology Office Note:    Date:  05/07/2023   ID:  Thomas Sandoval., DOB 05-23-1955, MRN [id number]  PCP:  Bennie Pierini, FNP  Cardiologist:  Little Ishikawa, MD  Electrophysiologist:  Lanier Prude, MD   Referring MD: Bennie Pierini, *   Chief Complaint  Patient presents with   Atrial Fibrillation    History of Present Illness:    Thomas Sandoval. is a 68 y.o. male with a hx of alcohol abuse, tobacco abuse, COPD, perforated gastric ulcer status postrepair in 2016, T2DM, hypertension, PAD, MVP, atrial fibrillation/flutter, systolic heart failure who presents for follow-up.  He was hospitalized September 2022 with acute pancreatitis found to be in A. fib.  Echo at that time showed EF 30 to 35%.  He left AMA.  At follow-up with his PCP 12/17/2020 he was still in A. fib and started on Xarelto and diltiazem.  He was referred to A. fib clinic, seen on 12/25/2020.  He underwent cardioversion on 01/20/2021 with successful restoration of sinus rhythm.  Echocardiogram 08/26/2017 showed EF 55 to 60%, mild mitral valve prolapse.  Echo 12/05/2020 showed EF 30 to 35%, global hypokinesis, and low normal RV function, moderate left atrial enlargement, trivial MR.  Zio patch x3 days on 02/10/2021 showed high percent A. fib burden with average rate 95 bpm.  Echocardiogram 05/21/2021 showed EF 55 to 60%, grade 2 diastolic dysfunction, normal RV function, no significant valvular disease.  He was admitted 11/2021 after being found unresponsive with significant hypoglycemia and hypotension.  EKG with aVR elevation and diffuse ST depressions, suggesting global ischemia.  EKG normalized with correction of his hypoglycemia.  He was seen by cardiology and outpatient coronary CT was recommended.  Coronary CTA was attempted but he was in A-fib on presentation and CT was canceled.  He was admitted for Tikosyn loading 02/2022.  Converted to sinus rhythm on Tikosyn.  Stress PET on  10/07/2022 showed small reversible perfusion defect in apical to mid inferior wall, mildly abnormal myocardial blood flow reserve (1.78).  Since last clinic visit, he reports he is doing okay.  From his Kardia mobile tracings he was in sinus rhythm on 2/22.  Denies any chest pain, dyspnea, lightheadedness, syncope, lower extremity edema, or palpitations.  He is smoking 0.5 to 1 pack/day.  Denies any alcohol use.   Wt Readings from Last 3 Encounters:  05/07/23 178 lb (80.7 kg)  05/04/23 181 lb 6 oz (82.3 kg)  04/19/23 187 lb 6.4 oz (85 kg)    Past Medical History:  Diagnosis Date   Arthritis    Bladder cancer (HCC)    Chronic back pain    Chronic combined systolic and diastolic CHF (congestive heart failure) (HCC) 07/16/2021   COPD (chronic obstructive pulmonary disease) (HCC)    COPD with chronic bronchitis and emphysema (HCC)    DDD (degenerative disc disease)    Diabetic retinopathy of both eyes (HCC)    Essential hypertension    GERD (gastroesophageal reflux disease)    History of atrial flutter 02/07/2011   Converted to NSR with Cardizem   History of chronic bronchitis    History of hemolytic anemia 02/07/2011   secondary to Avelox   HOH (hard of hearing)    HOH (hard of hearing)    no eardrum and nerve damage on R, also HOH on L   Mild renal insufficiency 08/25/2017   Mitral valve prolapse    a. 2D Echo 11/27/14: EF 55-60%; images were inadequate for LV  wall motion assessment, + mild late systolic mitral valve prolapse involving the anterior leaflet.   PAD (peripheral artery disease) (HCC) 04/09/2014   Dr Myra Gianotti; bilateral SFA occlusion, R mid, L distal   PAF (paroxysmal atrial fibrillation) (HCC)    a. Dx 11/2014 during admission for perf ulcer.   Perforated ulcer (HCC)    a. 11/2014 s/p surgery.   Productive cough    Smokers' cough (HCC)    Type 1 diabetes mellitus (HCC) 03/10/1975    Past Surgical History:  Procedure Laterality Date   CARDIOVERSION N/A 01/20/2021    Procedure: CARDIOVERSION;  Surgeon: Pricilla Riffle, MD;  Location: [facility] ENDOSCOPY;  Service: Cardiovascular;  Laterality: N/A;   CYSTOSCOPY WITH URETEROSCOPY Right 08/14/2013   Procedure: CYSTOSCOPY WITH URETEROSCOPY BLADDER BIOPSY ;  Surgeon: Garnett Farm, MD;  Location: [facility];  Service: Urology;  Laterality: Right;   ESOPHAGOGASTRODUODENOSCOPY N/A 02/06/2013   Procedure: ESOPHAGOGASTRODUODENOSCOPY (EGD);  Surgeon: Hart Carwin, MD;  Location: [facility] ENDOSCOPY;  Service: Endoscopy;  Laterality: N/A;   LAPAROSCOPY N/A 11/25/2014   Procedure: LAPAROSCOPIC PRIMARY REPAIR OF PERFORATED PREPYLORIC ULCER WITH Peggye Ley;  Surgeon: Gaynelle Adu, MD;  Location: [facility] OR;  Service: General;  Laterality: N/A;   TONSILLECTOMY  as child   TRANSTHORACIC ECHOCARDIOGRAM  02-17-2011   MODERATE LVH/  EF 65%   TRANSURETHRAL RESECTION OF BLADDER TUMOR WITH GYRUS (TURBT-GYRUS) N/A 06/12/2013   Procedure: TRANSURETHRAL RESECTION OF BLADDER TUMOR WITH GYRUS (TURBT-GYRUS);  Surgeon: Garnett Farm, MD;  Location: [facility];  Service: Urology;  Laterality: N/A;   TYMPANIC MEMBRANE REPAIR  as child    Current Medications: Current Meds  Medication Sig   albuterol (VENTOLIN HFA) 108 (90 Base) MCG/ACT inhaler Inhale 2 puffs into the lungs every 6 (six) hours as needed for wheezing or shortness of breath.   cefUROXime (CEFTIN) 250 MG tablet Take 1 tablet (250 mg total) by mouth 2 (two) times daily with a meal. Take for the first week of rotating months (rotate with doxycycline)   cetirizine (ZYRTEC) 10 MG tablet Take 10 mg by mouth daily.   dofetilide (TIKOSYN) 250 MCG capsule TAKE 1 CAPSULE BY MOUTH TWICE A DAY   doxycycline (VIBRA-TABS) 100 MG tablet Take 1 tablet (100 mg total) by mouth 2 (two) times daily. Take for the first week of alternating months (alternating with cefuroxime)   empagliflozin (JARDIANCE) 10 MG TABS tablet Take 1 tablet (10 mg total) by mouth daily before breakfast.    famotidine (PEPCID) 20 MG tablet Take 1 tablet (20 mg total) by mouth every morning. Reported on 04/16/2015   Fluticasone-Umeclidin-Vilant (TRELEGY ELLIPTA) 100-62.5-25 MCG/ACT AEPB INHALE 1 PUFF INTO THE LUNGS daily   furosemide (LASIX) 40 MG tablet TAKE 1 TABLET BY MOUTH DAILY AS NEEDED (WEIGHT GAIN OF 3 LBS OVERNIGHT OR 5 LBS IN A WEEK).   HYDROcodone-acetaminophen (NORCO) 5-325 MG tablet Take 1 tablet by mouth every 6 (six) hours as needed for moderate pain (pain score 4-6) or severe pain (pain score 7-10). Do not fill prior to 02-11-2023   insulin aspart (NOVOLOG) 100 UNIT/ML injection PER SLIDING SCALE: 190 - 200 = 2 UNITS. 300 AND ABOVE = 7 UNITS.   insulin glargine (LANTUS) 100 UNIT/ML injection INJECT 0.4 MLS (40 UNITS TOTAL) INTO THE SKIN IN THE MORNING.   Insulin Syringe-Needle U-100 (B-D INS SYR ULTRAFINE 1CC/30G) 30G X 1/2" 1 ML MISC Use 4 times a day with insulin Dx E11.9   Insulin Syringes, Disposable, U-100  1 ML MISC Use 4 times a day for insulin injection Dx E11.9   ipratropium-albuterol (DUONEB) 0.5-2.5 (3) MG/3ML SOLN INHALE CONTENTS OF 1 VIAL VIA NEBULIZER EVERY 4 HOURS AS NEEDED   levalbuterol (XOPENEX) 0.31 MG/3ML nebulizer solution TAKE 3 MLS (0.31 MG TOTAL) BY NEBULIZATION EVERY 4 (FOUR) HOURS AS NEEDED FOR WHEEZING.   losartan (COZAAR) 50 MG tablet Take 1 tablet (50 mg total) by mouth daily.   metoprolol succinate (TOPROL-XL) 50 MG 24 hr tablet Take 1.5 tablets (75 mg total) by mouth 2 (two) times daily. Take with or immediately following a meal.   naloxone (NARCAN) nasal spray 4 mg/0.1 mL Place 1 spray into the nose as needed (For opiate overdose).   OVER THE COUNTER MEDICATION Take 1-2 tablets by mouth See admin instructions. Super Beets gummies- Chew 1-2 gummies by mouth every day   OXYGEN Inhale 3 L/min into the lungs continuous.   rivaroxaban (XARELTO) 20 MG TABS tablet Take 1 tablet (20 mg total) by mouth daily with supper.   [DISCONTINUED] empagliflozin (JARDIANCE) 10 MG  TABS tablet Take 1 tablet (10 mg total) by mouth daily.     Allergies:   Avelox [moxifloxacin hcl in nacl], Azithromycin, and Bactrim [sulfamethoxazole-trimethoprim]   Social History   Socioeconomic History   Marital status: Married    Spouse name: Not on file   Number of children: Not on file   Years of education: Not on file   Highest education level: Not on file  Occupational History   Occupation: Tobacco Farmer  Tobacco Use   Smoking status: Every Day    Current packs/day: 0.50    Average packs/day: 0.5 packs/day for 30.0 years (15.0 ttl pk-yrs)    Types: Cigarettes   Smokeless tobacco: Former    Quit date: 06/08/1978   Tobacco comments:    1/2 pk 03/23/23 NM, CMA   Vaping Use   Vaping status: Never Used  Substance and Sexual Activity   Alcohol use: Not Currently    Comment: 5 quarts per week 12/26/21   Drug use: No   Sexual activity: Not on file  Other Topics Concern   Not on file  Social History Narrative   Lives in [place] with wife and 2 sons.    Social Drivers of Corporate investment banker Strain: Low Risk  (04/12/2023)   Overall Financial Resource Strain (CARDIA)    Difficulty of Paying Living Expenses: Not very hard  Food Insecurity: Low Risk  (11/24/2022)   Received from [facility]   Hunger Vital Sign    Worried About Running Out of Food in the Last Year: Never true    Ran Out of Food in the Last Year: Never true  Transportation Needs: No Transportation Needs (11/24/2022)   Received from [facility]    In the past 12 months, has lack of reliable transportation kept you from medical appointments, meetings, work or from getting things needed for daily living? : No  Physical Activity: Inactive (04/12/2023)   Exercise Vital Sign    Days of Exercise per Week: 0 days    Minutes of Exercise per Session: 0 min  Stress: No Stress Concern Present (04/12/2023)   [employer] of Occupational Health - Occupational Stress Questionnaire    Feeling  of Stress : Not at all  Social Connections: Socially Isolated (04/12/2023)   Social Connection and Isolation Panel [NHANES]    Frequency of Communication with Friends and Family: Once a week    Frequency  of Social Gatherings with Friends and Family: Never    Attends Religious Services: Never    Database administrator or Organizations: No    Attends Engineer, structural: Never    Marital Status: Married     Family History: The patient's family history includes Breast cancer in his mother; Cancer in his mother; Diabetes in his son; Heart attack in his father; Heart disease in his father; Rheumatic fever in his father.  ROS:   Please see the history of present illness.     All other systems reviewed and are negative.  EKGs/Labs/Other Studies Reviewed:    The following studies were reviewed today:   EKG:   01/19/2023: Atrial flutter, rate 97, QTc 441 09/29/22: AFL, rate 87, Qtc 459 06/24/22: Normal sinus rhythm, rate 60, QTc 452 02/04/22: Atrial fibrillation, rate 104 11/19/2021: Normal sinus rhythm, rate 72, left axis deviation 08/20/2021: Atrial flutter with variable conduction, rate 87 05/20/21: Normal sinus rhythm, rate 61, left axis deviation, nonspecific T wave flattening, Q waves in leads III, aVF 03/14/21:NSR, ST depression<1 mm in inferior leads and V4-6, rate 61  Recent Labs: 12/29/2022: Magnesium 1.9 05/04/2023: ALT 5; BUN 27; Creatinine, Ser 1.79; Hemoglobin 10.4; Platelets 253.0; Potassium 4.0; Sodium 135  Recent Lipid Panel    Component Value Date/Time   CHOL 99 (L) 03/16/2023 1436   TRIG 139 03/16/2023 1436   HDL 28 (L) 03/16/2023 1436   CHOLHDL 3.5 03/16/2023 1436   LDLCALC 47 03/16/2023 1436    Physical Exam:    VS:  BP 101/64   Pulse 89   Ht 5\' 8"  (1.727 m)   Wt 178 lb (80.7 kg)   SpO2 100%   BMI 27.06 kg/m     Wt Readings from Last 3 Encounters:  05/07/23 178 lb (80.7 kg)  05/04/23 181 lb 6 oz (82.3 kg)  04/19/23 187 lb 6.4 oz (85 kg)      GEN:  Well nourished, well developed in no acute distress HEENT: Normal NECK: No JVD; No carotid bruits CARDIAC: RRR, no murmurs, rubs, gallops RESPIRATORY:  Clear to auscultation without rales, wheezing or rhonchi  ABDOMEN: Soft, non-tender, non-distended MUSCULOSKELETAL: no edema; No deformity  SKIN: Warm and dry NEUROLOGIC:  Alert and oriented x 3 PSYCHIATRIC:  Normal affect   ASSESSMENT:    1. Atrial flutter, unspecified type (HCC)   2. Chronic combined systolic and diastolic heart failure (HCC)   3. Coronary artery disease involving native coronary artery of native heart, unspecified whether angina present   4. Essential hypertension   5. Tobacco use   6. PAD (peripheral artery disease) (HCC)      PLAN:    Persistent atrial fibrillation/flutter: CHA2DS2-VASc score 4.  Underwent successful cardioversion on 01/20/2021, but was back in A. fib at follow-up appointment on 01/24/2021.  Zio patch x3 days on 02/10/2021 showed 100% percent A. fib burden with average rate 95 bpm.   -Continue Xarelto 20 mg daily -Continue Toprol-XL 75 mg twice daily -Given possible tachycardia induced cardiomyopathy, would recommend rhythm control strategy.  Referred to EP to consider ablation versus antiarrhythmic.  Seen by Dr Lalla Brothers.  Admitted 02/2022 for Tikosyn loading.  Continue Tikosyn 250 mcg twice daily.  He is in atrial flutter in clinic today but from review of his Kardia mobile tracings, was in sinus rhythm on 2/22.  Recommend scheduling follow-up in A-fib clinic in next 1 to 2 weeks, if remains in atrial flutter would recommend cardioversion.  However will need to coordinate  cardioversion around his upcoming colonoscopy   Chronic combined systolic and diastolic heart failure: EF 30 to 35% on echocardiogram during admission with acute pancreatitis in September 2022.  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  Echocardiogram 05/21/2021 showed EF 55 to 60% -Continue Lasix 20 mg daily; he reports  he has been taking 40 mg on MWF, would recommend decreasing to 20 mg daily given recent worsening in his renal function.  Recheck BMET/magnesium next week.  Recommend monitoring daily weights and can take extra Lasix dose if gains more than 3 pounds 1 day or 5 pounds in 1 week -Continue Toprol-XL 75 mg BID -Continue losartan 50 mg daily -Continue Jardiance 10 mg daily  CAD: He was admitted 11/2021 after being found unresponsive with significant hypoglycemia and hypotension.  EKG with aVR elevation and diffuse ST depressions, suggesting global ischemia.  EKG normalized with correction of his hypoglycemia.  He was seen by cardiology and outpatient coronary CT was recommended.  He denies any chest pain but having dyspnea on exertion -Recommend coronary CTA to evaluate for obstructive CAD.  Coronary CTA was attempted but he was in A-fib on presentation and was canceled.  Stress PET on 10/07/2022 showed small reversible perfusion defect in apical to mid inferior wall, mildly abnormal myocardial blood flow reserve (1.78), EF reported to drop from 81% at rest to 30% at stress (processing images I got 60% at rest and improved to 64% at stress), overall low risk study -LDL 43 on 09/07/2022  Alcohol abuse: He reports he stopped drinking after starting Tikosyn.  Congratulated patient on quitting and encouraged continued cessation  Tobacco use: Patient counseled on the risk of tobacco use and cessation strongly encouraged.  Hypertension: Continue Toprol-XL, losartan.  Appears controlled  T2DM: On insulin.  A1c 6.4% on 09/07/2022  PAD: ABIs 2019 are severely reduced (right 0.52, left 0.48).  Lower extremity duplex 04/2014 showed bilateral SFA occlusions.  ABIs 02/2022 were 0.36 on right, 0.45 on left.  Follows with vascular surgery   RTC in 3 months  Medication Adjustments/Labs and Tests Ordered: Current medicines are reviewed at length with the patient today.  Concerns regarding medicines are outlined above.   Orders Placed This Encounter  Procedures   Basic Metabolic Panel (BMET)   Magnesium   Amb Referral to AFIB Clinic   EKG 12-Lead   EKG 12-Lead    No orders of the defined types were placed in this encounter.   Patient Instructions  Medication Instructions:  Continue current medications *If you need a refill on your cardiac medications before your next appointment, please call your pharmacy*   Lab Work: BMET, mg If you have labs (blood work) drawn today and your tests are completely normal, you will receive your results only by: MyChart Message (if you have MyChart) OR A paper copy in the mail If you have any lab test that is abnormal or we need to change your treatment, we will call you to review the results.   Testing/Procedures: none   Follow-Up: At [facility], you and your health needs are our priority.  As part of our continuing mission to provide you with exceptional heart care, we have created designated Provider Care Teams.  These Care Teams include your primary Cardiologist (physician) and Advanced Practice Providers (APPs -  Physician Assistants and Nurse Practitioners) who all work together to provide you with the care you need, when you need it.  We recommend signing up for the patient portal called "MyChart".  Sign up information is  provided on this After Visit Summary.  MyChart is used to connect with patients for Virtual Visits (Telemedicine).  Patients are able to view lab/test results, encounter notes, upcoming appointments, etc.  Non-urgent messages can be sent to your provider as well.   To learn more about what you can do with MyChart, go to [email or web address].    Your next appointment:   3 month(s)  Provider:   Little Ishikawa, MD     Other Instructions none       Signed, Little Ishikawa, MD  05/07/2023 5:47 PM    [facility]

## 2023-05-07 ENCOUNTER — Ambulatory Visit: Payer: Medicare Other | Attending: Cardiology | Admitting: Cardiology

## 2023-05-07 ENCOUNTER — Encounter: Payer: Self-pay | Admitting: Cardiology

## 2023-05-07 VITALS — BP 101/64 | HR 89 | Ht 68.0 in | Wt 178.0 lb

## 2023-05-07 DIAGNOSIS — I10 Essential (primary) hypertension: Secondary | ICD-10-CM | POA: Diagnosis not present

## 2023-05-07 DIAGNOSIS — I4892 Unspecified atrial flutter: Secondary | ICD-10-CM

## 2023-05-07 DIAGNOSIS — Z720 Tobacco use: Secondary | ICD-10-CM | POA: Diagnosis not present

## 2023-05-07 DIAGNOSIS — I2510 Atherosclerotic heart disease of native coronary artery without angina pectoris: Secondary | ICD-10-CM | POA: Diagnosis not present

## 2023-05-07 DIAGNOSIS — I739 Peripheral vascular disease, unspecified: Secondary | ICD-10-CM | POA: Diagnosis not present

## 2023-05-07 DIAGNOSIS — I5042 Chronic combined systolic (congestive) and diastolic (congestive) heart failure: Secondary | ICD-10-CM

## 2023-05-07 NOTE — Patient Instructions (Signed)
 Medication Instructions:  Continue current medications *If you need a refill on your cardiac medications before your next appointment, please call your pharmacy*   Lab Work: BMET, mg If you have labs (blood work) drawn today and your tests are completely normal, you will receive your results only by: MyChart Message (if you have MyChart) OR A paper copy in the mail If you have any lab test that is abnormal or we need to change your treatment, we will call you to review the results.   Testing/Procedures: none   Follow-Up: At [facility], you and your health needs are our priority.  As part of our continuing mission to provide you with exceptional heart care, we have created designated Provider Care Teams.  These Care Teams include your primary Cardiologist (physician) and Advanced Practice Providers (APPs -  Physician Assistants and Nurse Practitioners) who all work together to provide you with the care you need, when you need it.  We recommend signing up for the patient portal called "MyChart".  Sign up information is provided on this After Visit Summary.  MyChart is used to connect with patients for Virtual Visits (Telemedicine).  Patients are able to view lab/test results, encounter notes, upcoming appointments, etc.  Non-urgent messages can be sent to your provider as well.   To learn more about what you can do with MyChart, go to [email or web address].    Your next appointment:   3 month(s)  Provider:   Little Ishikawa, MD     Other Instructions none

## 2023-05-11 ENCOUNTER — Other Ambulatory Visit: Payer: Self-pay | Admitting: Physical Medicine and Rehabilitation

## 2023-05-11 ENCOUNTER — Encounter: Payer: Self-pay | Admitting: Physical Medicine & Rehabilitation

## 2023-05-11 ENCOUNTER — Encounter: Payer: Medicare Other | Attending: Registered Nurse | Admitting: Physical Medicine & Rehabilitation

## 2023-05-11 ENCOUNTER — Other Ambulatory Visit (HOSPITAL_COMMUNITY): Payer: Self-pay

## 2023-05-11 DIAGNOSIS — J439 Emphysema, unspecified: Secondary | ICD-10-CM | POA: Diagnosis not present

## 2023-05-11 DIAGNOSIS — M47816 Spondylosis without myelopathy or radiculopathy, lumbar region: Secondary | ICD-10-CM | POA: Insufficient documentation

## 2023-05-11 DIAGNOSIS — Z7409 Other reduced mobility: Secondary | ICD-10-CM | POA: Diagnosis not present

## 2023-05-11 DIAGNOSIS — J4489 Other specified chronic obstructive pulmonary disease: Secondary | ICD-10-CM | POA: Insufficient documentation

## 2023-05-11 DIAGNOSIS — M7918 Myalgia, other site: Secondary | ICD-10-CM | POA: Insufficient documentation

## 2023-05-11 DIAGNOSIS — Z79891 Long term (current) use of opiate analgesic: Secondary | ICD-10-CM | POA: Insufficient documentation

## 2023-05-11 DIAGNOSIS — M545 Low back pain, unspecified: Secondary | ICD-10-CM | POA: Diagnosis not present

## 2023-05-11 DIAGNOSIS — G894 Chronic pain syndrome: Secondary | ICD-10-CM | POA: Insufficient documentation

## 2023-05-11 DIAGNOSIS — G8929 Other chronic pain: Secondary | ICD-10-CM | POA: Insufficient documentation

## 2023-05-11 DIAGNOSIS — Z5181 Encounter for therapeutic drug level monitoring: Secondary | ICD-10-CM | POA: Insufficient documentation

## 2023-05-11 MED ORDER — IOHEXOL 180 MG/ML  SOLN
2.0000 mL | Freq: Once | INTRAMUSCULAR | Status: AC
  Administered 2023-05-11: 2 mL

## 2023-05-11 MED ORDER — HYDROCODONE-ACETAMINOPHEN 5-325 MG PO TABS
1.0000 | ORAL_TABLET | Freq: Four times a day (QID) | ORAL | 0 refills | Status: DC | PRN
  Filled 2023-05-11: qty 120, 30d supply, fill #0

## 2023-05-11 MED ORDER — LIDOCAINE HCL 1 % IJ SOLN
10.0000 mL | Freq: Once | INTRAMUSCULAR | Status: AC
Start: 2023-05-11 — End: 2023-05-11
  Administered 2023-05-11: 10 mL

## 2023-05-11 MED ORDER — LIDOCAINE HCL (PF) 2 % IJ SOLN
4.0000 mL | Freq: Once | INTRAMUSCULAR | Status: AC
  Administered 2023-05-11: 4 mL

## 2023-05-11 NOTE — Progress Notes (Unsigned)
Bilateral Lumbar L3, L4  medial branch blocks and L 5 dorsal ramus injection under fluoroscopic guidance  Indication: Lumbar pain which is not relieved by medication management or other conservative care and interfering with self-care and mobility.  Informed consent was obtained after describing risks and benefits of the procedure with the patient, this includes bleeding, infection, paralysis and medication side effects.  The patient wishes to proceed and has given written consent.  The patient was placed in prone position.  The lumbar area was marked and prepped with Betadine.  One mL of 1% lidocaine was injected into each of 6 areas into the skin and subcutaneous tissue.  Then a 22-gauge 3.5" spinal needle was inserted targeting the junction of the left S1 superior articular process and sacral ala junction. Needle was advanced under fluoroscopic guidance.  Bone contact was made.Omnipaque 180 was injected x 0.5 mL demonstrating no intravascular uptake.  Then a solution  of 2% MPF lidocaine was injected x 0.5 mL.  Then the left L5 superior articular process in transverse process junction was targeted.  Bone contact was made. Omnipaque 180 was injected x 0.5 mL demonstrating no intravascular uptake. Then a solution containing  2% MPF lidocaine was injected x 0.5 mL.  Then the left L4 superior articular process in transverse process junction was targeted.  Bone contact was made. Omnipaque 180 was injected x 0.5 mL demonstrating no intravascular uptake.  Then a solution containing2% MPF lidocaine was injected x 0.5 mL.  This same procedure was performed on the right side using the same needle, technique and injectate.  Patient tolerated procedure well.  Post procedure instructions were given.   Lidocaine 1% with preservative multidose, 10ml no waste Lidocaine 2% MPF, 5ml bottle, 3ml used 2ml waste Omnipaque 180 1.5 ml used, 8.5 ml waste

## 2023-05-11 NOTE — Progress Notes (Signed)
 Subjective:    Patient ID: Thomas Ramus., male    DOB: [birth year], 68 y.o.   MRN: [id number]  HPI  Thomas Dishman. is a 68 y.o. year old male  who  has a past medical history of Arthritis, Bladder cancer (HCC), Chronic back pain, Chronic combined systolic and diastolic CHF (congestive heart failure) (HCC) (07/16/2021), COPD (chronic obstructive pulmonary disease) (HCC), COPD with chronic bronchitis and emphysema (HCC), DDD (degenerative disc disease), Diabetic retinopathy of both eyes (HCC), Essential hypertension, GERD (gastroesophageal reflux disease), History of atrial flutter (02/07/2011), History of chronic bronchitis, History of hemolytic anemia (02/07/2011), HOH (hard of hearing), HOH (hard of hearing), Mild renal insufficiency (08/25/2017), Mitral valve prolapse, PAD (peripheral artery disease) (HCC) (04/09/2014), PAF (paroxysmal atrial fibrillation) (HCC), Perforated ulcer (HCC), Productive cough, Smokers' cough (HCC), and Type 1 diabetes mellitus (HCC) (03/10/1975).    They are presenting to PM&R clinic as a follow-up for chronic pain management for bilateral low back pain without sciatica.   Plan from last visit:  Chronic pain syndrome Encounter for long-term opiate analgesic use Encounter for therapeutic drug monitoring Indication for chronic opioid: M54.9 Medication and dose: Norco 5-325 mg TID PRN  --> increasing to 4 times daily 11-27 # pills per month: 90 Last UDS date: 07-20-2022 Opioid Treatment Agreement signed (Y/N): Y Opioid Treatment Agreement last reviewed with patient:  11/17/21 NCCSRS reviewed this encounter (include red flags):  Y   Medium Risk (10-90 MME) UDS every 3-6 months NCCSR check every visit Q3M follow up   - Send Norco to [facility]; 3 months of medication sent today   Has failed Butrans patch in the past due to vivid dreams and poor pain control   Spondylosis without myelopathy or radiculopathy, lumbar region Continue current  medications as above Planning on medial branch block with Dr. Doroteo Bradford; highly encourage patient to keep this appointment, he has been very resistant to interventional procedures in the past due to poor outcomes in loved ones.   Tobacco use Encouraged reduction or cessation, patient not interested at this time Reminded him to not smoke with oxygen on, advised him of risks involved.   Diabetes Confirmed with patient and wife no recent hospitalizations, blood sugars relatively well-controlled, he is doing a good job keeping this under better management!   Interval Hx:  - Therapies: Having trouble staying active because of his breathing.    - Follow ups: Had MBB L3/4/5 with Dr. Wynn Banker 3/4, with good results; Dr. Wynn Banker notes he may need sedation for further interventions given restlessness on the table. "I was hurting more leaving from that appointment then I was coming in".  No hospitalizations   - Falls: Feet slipped out from under him on the commode recently. His balance    - DME: None but "I can't walk very far". He is not interested in trying. Wife is asking about a transport wheelchair for appointments since he can't get very far because of his oxygen issues and his back problems.    - Medications: Adjustments last visit was helpful.   - Other concerns: Sugars have bene up recently but he's working on getting them back down.  Pain Inventory Average Pain 7 Pain Right Now 6 My pain is sharp, burning, and dull  In the last 24 hours, has pain interfered with the following? General activity 5 Relation with others 0 Enjoyment of life 0 What TIME of day is your pain at its worst? morning , daytime, evening, and night  Sleep (in general) Fair  Pain is worse with: walking, bending, standing, and some activites Pain improves with: rest and medication Relief from Meds: 5  Family History  Problem Relation Age of Onset   Breast cancer Mother    Cancer Mother        Breast    Rheumatic fever Father    Heart disease Father    Heart attack Father        Massive    Diabetes Son    Social History   Socioeconomic History   Marital status: Married    Spouse name: Not on file   Number of children: Not on file   Years of education: Not on file   Highest education level: Not on file  Occupational History   Occupation: Tobacco Farmer  Tobacco Use   Smoking status: Every Day    Current packs/day: 0.50    Average packs/day: 0.5 packs/day for 30.0 years (15.0 ttl pk-yrs)    Types: Cigarettes   Smokeless tobacco: Former    Quit date: 06/08/1978   Tobacco comments:    1/2 pk 03/23/23 NM, CMA   Vaping Use   Vaping status: Never Used  Substance and Sexual Activity   Alcohol use: Not Currently    Comment: 5 quarts per week 12/26/21   Drug use: No   Sexual activity: Not on file  Other Topics Concern   Not on file  Social History Narrative   Lives in [place] with wife and 2 sons.    Social Drivers of Health   Financial Resource Strain: Low Risk  (04/12/2023)   Overall Financial Resource Strain (CARDIA)    Difficulty of Paying Living Expenses: Not very hard  Food Insecurity: Low Risk  (11/24/2022)   Received from [facility]   Hunger Vital Sign    Worried About Running Out of Food in the Last Year: Never true    Ran Out of Food in the Last Year: Never true  Transportation Needs: No Transportation Needs (11/24/2022)   Received from [facility]    In the past 12 months, has lack of reliable transportation kept you from medical appointments, meetings, work or from getting things needed for daily living? : No  Physical Activity: Inactive (04/12/2023)   Exercise Vital Sign    Days of Exercise per Week: 0 days    Minutes of Exercise per Session: 0 min  Stress: No Stress Concern Present (04/12/2023)   [employer] of Occupational Health - Occupational Stress Questionnaire    Feeling of Stress : Not at all  Social Connections: Socially Isolated  (04/12/2023)   Social Connection and Isolation Panel [NHANES]    Frequency of Communication with Friends and Family: Once a week    Frequency of Social Gatherings with Friends and Family: Never    Attends Religious Services: Never    Database administrator or Organizations: No    Attends Banker Meetings: Never    Marital Status: Married   Past Surgical History:  Procedure Laterality Date   CARDIOVERSION N/A 01/20/2021   Procedure: CARDIOVERSION;  Surgeon: Pricilla Riffle, MD;  Location: [facility] ENDOSCOPY;  Service: Cardiovascular;  Laterality: N/A;   CYSTOSCOPY WITH URETEROSCOPY Right 08/14/2013   Procedure: CYSTOSCOPY WITH URETEROSCOPY BLADDER BIOPSY ;  Surgeon: Garnett Farm, MD;  Location: [facility];  Service: Urology;  Laterality: Right;   ESOPHAGOGASTRODUODENOSCOPY N/A 02/06/2013   Procedure: ESOPHAGOGASTRODUODENOSCOPY (EGD);  Surgeon: Hart Carwin, MD;  Location:  [facility] ENDOSCOPY;  Service: Endoscopy;  Laterality: N/A;   LAPAROSCOPY N/A 11/25/2014   Procedure: LAPAROSCOPIC PRIMARY REPAIR OF PERFORATED PREPYLORIC ULCER WITH Peggye Ley;  Surgeon: Gaynelle Adu, MD;  Location: [facility] OR;  Service: General;  Laterality: N/A;   TONSILLECTOMY  as child   TRANSTHORACIC ECHOCARDIOGRAM  02-17-2011   MODERATE LVH/  EF 65%   TRANSURETHRAL RESECTION OF BLADDER TUMOR WITH GYRUS (TURBT-GYRUS) N/A 06/12/2013   Procedure: TRANSURETHRAL RESECTION OF BLADDER TUMOR WITH GYRUS (TURBT-GYRUS);  Surgeon: Garnett Farm, MD;  Location[facility];  Service: Urology;  Laterality: N/A;   TYMPANIC MEMBRANE REPAIR  as child   Past Surgical History:  Procedure Laterality Date   CARDIOVERSION N/A 01/20/2021   Procedure: CARDIOVERSION;  Surgeon: Pricilla Riffle, MD;  Location[facility] ENDOSCOPY;  Service: Cardiovascular;  Laterality: N/A;   CYSTOSCOPY WITH URETEROSCOPY Right 08/14/2013   Procedure: CYSTOSCOPY WITH URETEROSCOPY BLADDER BIOPSY ;  Surgeon: Garnett Farm, MD;  Location: [facility];  Service: Urology;  Laterality: Right;   ESOPHAGOGASTRODUODENOSCOPY N/A 02/06/2013   Procedure: ESOPHAGOGASTRODUODENOSCOPY (EGD);  Surgeon: Hart Carwin, MD;  Location: [facility] ENDOSCOPY;  Service: Endoscopy;  Laterality: N/A;   LAPAROSCOPY N/A 11/25/2014   Procedure: LAPAROSCOPIC PRIMARY REPAIR OF PERFORATED PREPYLORIC ULCER WITH Peggye Ley;  Surgeon: Gaynelle Adu, MD;  Location: [facility] OR;  Service: General;  Laterality: N/A;   TONSILLECTOMY  as child   TRANSTHORACIC ECHOCARDIOGRAM  02-17-2011   MODERATE LVH/  EF 65%   TRANSURETHRAL RESECTION OF BLADDER TUMOR WITH GYRUS (TURBT-GYRUS) N/A 06/12/2013   Procedure: TRANSURETHRAL RESECTION OF BLADDER TUMOR WITH GYRUS (TURBT-GYRUS);  Surgeon: Garnett Farm, MD;  Location: [facility];  Service: Urology;  Laterality: N/A;   TYMPANIC MEMBRANE REPAIR  as child   Past Medical History:  Diagnosis Date   Arthritis    Bladder cancer (HCC)    Chronic back pain    Chronic combined systolic and diastolic CHF (congestive heart failure) (HCC) 07/16/2021   COPD (chronic obstructive pulmonary disease) (HCC)    COPD with chronic bronchitis and emphysema (HCC)    DDD (degenerative disc disease)    Diabetic retinopathy of both eyes (HCC)    Essential hypertension    GERD (gastroesophageal reflux disease)    History of atrial flutter 02/07/2011   Converted to NSR with Cardizem   History of chronic bronchitis    History of hemolytic anemia 02/07/2011   secondary to Avelox   HOH (hard of hearing)    HOH (hard of hearing)    no eardrum and nerve damage on R, also HOH on L   Mild renal insufficiency 08/25/2017   Mitral valve prolapse    a. 2D Echo 11/27/14: EF 55-60%; images were inadequate for LV wall motion assessment, + mild late systolic mitral valve prolapse involving the anterior leaflet.   PAD (peripheral artery disease) (HCC) 04/09/2014   Dr Myra Gianotti; bilateral SFA occlusion, R mid, L distal   PAF (paroxysmal atrial  fibrillation) (HCC)    a. Dx 11/2014 during admission for perf ulcer.   Perforated ulcer (HCC)    a. 11/2014 s/p surgery.   Productive cough    Smokers' cough (HCC)    Type 1 diabetes mellitus (HCC) 03/10/1975   There were no vitals taken for this visit.  Opioid Risk Score:   Fall Risk Score:  `1  Depression screen [facility] 2/9     02/03/2023    2:22 PM 02/02/2023    3:07 PM 01/12/2023  1:41 PM 12/28/2022    3:46 PM 11/02/2022    2:33 PM 09/07/2022    3:27 PM 07/20/2022    1:26 PM  Depression screen PHQ 2/9  Decreased Interest 0 0 0 0 0 0 0  Down, Depressed, Hopeless 0 0 0 0 0 0 0  PHQ - 2 Score 0 0 0 0 0 0 0  Altered sleeping  0  0  0   Tired, decreased energy  0  0  0   Change in appetite  0  0  0   Feeling bad or failure about yourself   0  0  0   Trouble concentrating  0  0  0   Moving slowly or fidgety/restless  0  0  0   Suicidal thoughts  0  0  0   PHQ-9 Score  0  0  0   Difficult doing work/chores  Not difficult at all  Not difficult at all  Not difficult at all      Review of Systems  Musculoskeletal:  Positive for back pain.  All other systems reviewed and are negative.      Objective:   Physical Exam  PE: Constitution: Appropriate appearance for age. No apparent distress . +underweight Resp: No respiratory distress. No accessory muscle usage. CTAB and on 2 L [facility] Cardio: Well perfused appearance. No peripheral edema. Abdomen: Nondistended. Nontender.   Psych: Appropriate mood and affect. Neuro: AAOx4. No apparent cognitive deficits . +HOH with bilateral hearing aides.    MSK:  + TTP low back bilateral, SI joints, and PSISs--unchanged Worse back pain with facet loading than with forward bending bilaterally, mostly in lumbar paraspinals.--unchanged Gait: Not tested due to fatigue and concern for fall risk      Assessment & Plan:  Thomas Gautier. is a 68 y.o. year old male  who  has a past medical history of Arthritis, Bladder cancer (HCC), Chronic  back pain, Chronic combined systolic and diastolic CHF (congestive heart failure) (HCC) (07/16/2021), COPD (chronic obstructive pulmonary disease) (HCC), COPD with chronic bronchitis and emphysema (HCC), DDD (degenerative disc disease), Diabetic retinopathy of both eyes (HCC), Essential hypertension, GERD (gastroesophageal reflux disease), History of atrial flutter (02/07/2011), History of chronic bronchitis, History of hemolytic anemia (02/07/2011), HOH (hard of hearing), HOH (hard of hearing), Mild renal insufficiency (08/25/2017), Mitral valve prolapse, PAD (peripheral artery disease) (HCC) (04/09/2014), PAF (paroxysmal atrial fibrillation) (HCC), Perforated ulcer (HCC), Productive cough, Smokers' cough (HCC), and Type 1 diabetes mellitus (HCC) (03/10/1975).   They are presenting to PM&R clinic as a follow-up for chronic pain management for bilateral low back pain without sciatica.   Chronic pain syndrome Encounter for long-term opiate analgesic use Encounter for therapeutic drug monitoring  Continue Norco 5 mg Q6H PRN through May, Follow up with me every 3 months for chronic pain;   you do not need to repeat MBB /  injections due to discomfort  Patient has failed PT  Not interested in surgical options  Failed butrans patch  Chronic bilateral low back pain without sciatica COPD with chronic bronchitis and emphysema (HCC) Mobility impaired -     Wheelchair    I have sent a script for a lightweight foldable wheelchair to use for long distances; give me a call if there is an issue obtaining this.   A face to face evaluation was performed during today's visit. Due to the patient's COPD and chronic low back pain the patient requires a manual light wheelchair  with ability to fold up for transport in order to perform mobility and basic ADL's. This will allow them to conserve energy and transition to being pushed in the wheelchair to prevent hypoxia and pain during prolonged ambulation activities  such as going to/from the car, ambulating around his property, getting groceries and going to doctor's appointments.

## 2023-05-11 NOTE — Progress Notes (Unsigned)
  PROCEDURE RECORD [facility]   Name: Thomas Sandoval. DOB:May 20, 1955 MRN: [id number]  Date:05/11/2023  Physician: Claudette Laws, MD    Nurse/CMA: Nedra Hai, CMA  Allergies:  Allergies  Allergen Reactions   Avelox [Moxifloxacin Hcl In Nacl] Other (See Comments)    Hemolysis  In 2012   Azithromycin Other (See Comments) and Nausea And Vomiting    "Severe stomach cramps; told to list as an allergy by dr. Lawrence Marseilles ago"   Bactrim [Sulfamethoxazole-Trimethoprim] Diarrhea and Nausea And Vomiting    Consent Signed: Yes.    Is patient diabetic? Yes.    CBG today? 54  Pregnant: No. LMP: No LMP for male patient. (age 52-55)  Anticoagulants: no Anti-inflammatory: no Antibiotics: no  Procedure:  Bilateral L3-4-5 Medial Branch  Position: Prone Start Time: 3:00 pm  End Time: 3:15 pm  Fluoro Time: 98  RN/CMA Nedra Hai, CMA Javonna Balli, CMA    Time 2:50 pm     BP 105/53 96/47    Pulse 74 81    Respirations 16 16    O2 Sat 98 99    S/S 6 6    Pain Level 8/10 0/10     D/C home with wife, patient A & O X 3, D/C instructions reviewed, and sits independently.

## 2023-05-11 NOTE — Patient Instructions (Signed)

## 2023-05-12 ENCOUNTER — Encounter: Payer: Self-pay | Admitting: Physical Medicine and Rehabilitation

## 2023-05-12 ENCOUNTER — Encounter: Payer: Medicare Other | Admitting: Physical Medicine and Rehabilitation

## 2023-05-12 VITALS — BP 120/74 | HR 74 | Ht 68.0 in | Wt 178.0 lb

## 2023-05-12 DIAGNOSIS — Z5181 Encounter for therapeutic drug level monitoring: Secondary | ICD-10-CM

## 2023-05-12 DIAGNOSIS — Z79891 Long term (current) use of opiate analgesic: Secondary | ICD-10-CM

## 2023-05-12 DIAGNOSIS — M545 Low back pain, unspecified: Secondary | ICD-10-CM

## 2023-05-12 DIAGNOSIS — G8929 Other chronic pain: Secondary | ICD-10-CM

## 2023-05-12 DIAGNOSIS — Z7409 Other reduced mobility: Secondary | ICD-10-CM | POA: Diagnosis not present

## 2023-05-12 DIAGNOSIS — G894 Chronic pain syndrome: Secondary | ICD-10-CM | POA: Diagnosis not present

## 2023-05-12 DIAGNOSIS — M47816 Spondylosis without myelopathy or radiculopathy, lumbar region: Secondary | ICD-10-CM | POA: Diagnosis not present

## 2023-05-12 DIAGNOSIS — J439 Emphysema, unspecified: Secondary | ICD-10-CM | POA: Diagnosis not present

## 2023-05-12 DIAGNOSIS — J4489 Other specified chronic obstructive pulmonary disease: Secondary | ICD-10-CM

## 2023-05-12 DIAGNOSIS — M7918 Myalgia, other site: Secondary | ICD-10-CM | POA: Diagnosis not present

## 2023-05-12 NOTE — Patient Instructions (Addendum)
 Follow up with me every 3 months for chronic pain; you do not need to repeat injections  I have sent a script for a lightweight foldable wheelchair to use for long distances; give me a call if there is an issue obtaining this.

## 2023-05-13 DIAGNOSIS — H43813 Vitreous degeneration, bilateral: Secondary | ICD-10-CM | POA: Diagnosis not present

## 2023-05-13 DIAGNOSIS — H4312 Vitreous hemorrhage, left eye: Secondary | ICD-10-CM | POA: Diagnosis not present

## 2023-05-13 DIAGNOSIS — I10 Essential (primary) hypertension: Secondary | ICD-10-CM | POA: Diagnosis not present

## 2023-05-13 DIAGNOSIS — E113512 Type 2 diabetes mellitus with proliferative diabetic retinopathy with macular edema, left eye: Secondary | ICD-10-CM | POA: Diagnosis not present

## 2023-05-14 LAB — BASIC METABOLIC PANEL
BUN/Creatinine Ratio: 12 (ref 10–24)
BUN: 13 mg/dL (ref 8–27)
CO2: 26 mmol/L (ref 20–29)
Calcium: 9.5 mg/dL (ref 8.6–10.2)
Chloride: 101 mmol/L (ref 96–106)
Creatinine, Ser: 1.09 mg/dL (ref 0.76–1.27)
Glucose: 43 mg/dL — ABNORMAL LOW (ref 70–99)
Potassium: 4 mmol/L (ref 3.5–5.2)
Sodium: 141 mmol/L (ref 134–144)
eGFR: 75 mL/min/{1.73_m2} (ref >59)

## 2023-05-14 LAB — MAGNESIUM: Magnesium: 1.9 mg/dL (ref 1.6–2.3)

## 2023-05-15 MED ORDER — HYDROCODONE-ACETAMINOPHEN 5-325 MG PO TABS: 1.0000 | ORAL_TABLET | Freq: Four times a day (QID) | ORAL | 0 refills | Status: DC | PRN

## 2023-05-16 ENCOUNTER — Other Ambulatory Visit: Payer: Self-pay | Admitting: Emergency Medicine

## 2023-05-16 DIAGNOSIS — J189 Pneumonia, unspecified organism: Secondary | ICD-10-CM

## 2023-05-16 DIAGNOSIS — J449 Chronic obstructive pulmonary disease, unspecified: Secondary | ICD-10-CM

## 2023-05-16 DIAGNOSIS — R946 Abnormal results of thyroid function studies: Secondary | ICD-10-CM

## 2023-05-18 ENCOUNTER — Telehealth: Payer: Self-pay | Admitting: Physical Medicine and Rehabilitation

## 2023-05-18 NOTE — Telephone Encounter (Signed)
 Patient wanting an increase on pain medication, he said he has gotten worse after the injections.

## 2023-05-18 NOTE — Telephone Encounter (Signed)
 He got a medial branch block; this is a localized injection with lidocaine only and can cause come localized discomfort, but should not otherwise effect the back, and the medication only lasts a few hours. I would recommend conservative management with warm compress, OTC SalonPas or lidocaine patches right where he injected, and stretching. Given his respiratory issues I would be very hesitant to increase his standing pain medication. Feel free to give [place] a call in a week if things are not improving and we can try a muscle relaxant in addition. Thanks!

## 2023-05-20 ENCOUNTER — Encounter: Payer: Self-pay | Admitting: *Deleted

## 2023-05-20 DIAGNOSIS — J9601 Acute respiratory failure with hypoxia: Secondary | ICD-10-CM | POA: Diagnosis not present

## 2023-05-20 NOTE — Telephone Encounter (Signed)
 Message sent to patient via mychart with Dr Marina Gravel response.

## 2023-05-25 ENCOUNTER — Ambulatory Visit (HOSPITAL_COMMUNITY): Admitting: Physician Assistant

## 2023-05-28 ENCOUNTER — Other Ambulatory Visit: Payer: Self-pay | Admitting: Nurse Practitioner

## 2023-05-28 DIAGNOSIS — E10649 Type 1 diabetes mellitus with hypoglycemia without coma: Secondary | ICD-10-CM

## 2023-06-01 ENCOUNTER — Encounter (HOSPITAL_COMMUNITY): Payer: Self-pay | Admitting: Physician Assistant

## 2023-06-01 ENCOUNTER — Ambulatory Visit (HOSPITAL_COMMUNITY)
Admission: RE | Admit: 2023-06-01 | Discharge: 2023-06-01 | Disposition: A | Discharge status: Home / Self Care | Source: Ambulatory Visit | Attending: Physician Assistant | Admitting: Physician Assistant

## 2023-06-01 VITALS — BP 90/60 | HR 93 | Ht 68.0 in | Wt 181.6 lb

## 2023-06-01 DIAGNOSIS — I484 Atypical atrial flutter: Secondary | ICD-10-CM | POA: Insufficient documentation

## 2023-06-01 DIAGNOSIS — D6869 Other thrombophilia: Secondary | ICD-10-CM | POA: Insufficient documentation

## 2023-06-01 DIAGNOSIS — Z9889 Other specified postprocedural states: Secondary | ICD-10-CM | POA: Insufficient documentation

## 2023-06-01 DIAGNOSIS — Z79899 Other long term (current) drug therapy: Secondary | ICD-10-CM | POA: Insufficient documentation

## 2023-06-01 DIAGNOSIS — I4819 Other persistent atrial fibrillation: Secondary | ICD-10-CM | POA: Diagnosis not present

## 2023-06-01 DIAGNOSIS — I5022 Chronic systolic (congestive) heart failure: Secondary | ICD-10-CM | POA: Insufficient documentation

## 2023-06-01 DIAGNOSIS — I341 Nonrheumatic mitral (valve) prolapse: Secondary | ICD-10-CM | POA: Diagnosis not present

## 2023-06-01 DIAGNOSIS — I2510 Atherosclerotic heart disease of native coronary artery without angina pectoris: Secondary | ICD-10-CM | POA: Diagnosis not present

## 2023-06-01 DIAGNOSIS — E1051 Type 1 diabetes mellitus with diabetic peripheral angiopathy without gangrene: Secondary | ICD-10-CM | POA: Diagnosis not present

## 2023-06-01 DIAGNOSIS — Z7901 Long term (current) use of anticoagulants: Secondary | ICD-10-CM | POA: Diagnosis not present

## 2023-06-01 DIAGNOSIS — I110 Hypertensive heart disease with heart failure: Secondary | ICD-10-CM | POA: Diagnosis not present

## 2023-06-01 DIAGNOSIS — J449 Chronic obstructive pulmonary disease, unspecified: Secondary | ICD-10-CM | POA: Diagnosis not present

## 2023-06-01 DIAGNOSIS — Z5181 Encounter for therapeutic drug level monitoring: Secondary | ICD-10-CM

## 2023-06-01 DIAGNOSIS — K219 Gastro-esophageal reflux disease without esophagitis: Secondary | ICD-10-CM | POA: Insufficient documentation

## 2023-06-01 NOTE — Patient Instructions (Addendum)
 Cardioversion scheduled for: Monday, May 5th   - Arrive at the [facility] and go to admitting at 6:30am   - Do not eat or drink anything after midnight the night prior to your procedure.   - Take all your morning medication (except diabetic medications) with a sip of water prior to arrival.  - You will not be able to drive home after your procedure.    - Do NOT miss any doses of your blood thinner - if you should miss a dose please notify our office immediately.   - If you feel as if you go back into normal rhythm prior to scheduled cardioversion, please notify our office immediately.   If your procedure is canceled in the cardioversion suite you will be charged a cancellation fee.     Hold below medications 72 hours prior to scheduled procedure/anesthesia. Restart medication on the following day after scheduled procedure/anesthesia  Empagliflozin [place] Pepper)  -- May 2nd      For those patients who have a scheduled procedure/anesthesia on the same day of the week as their dose, hold the medication on the day of surgery.  They can take their scheduled dose the week before.  **Patients on the above medications scheduled for elective procedures that have not held the medication for the appropriate amount of time are at risk of cancellation or change in the anesthetic plan.

## 2023-06-01 NOTE — Progress Notes (Addendum)
 Primary Care Physician: Bennie Pierini, FNP Primary Cardiologist: Dr Bjorn Pippin Primary Electrophysiologist: Dr Lalla Brothers Referring Physician: Gilford Silvius NP   Thomas Sandoval. is a 68 y.o. male with a history of alcohol abuse, tobacco abuse, COPD, MVP, perforated gastric ulcer s/p repair 2016, DM, HTN, PAD (bilateral fem-pop occlusion), GERD, systolic CHF, atrial flutter, atrial fibrillation who presents for follow up in the [facility]. The patient was initially diagnosed with atrial flutter in 2012 and afib in the setting of his perforated ulcer. Patient has a CHADS2VASC score of 4, has declined anticoagulation in the past. He was hospitalized 9/26-9/30/22 with acute alcoholic pancreatitis and was also found to be in afib at the time. Echo showed EF 30-35%. He left the hospital AMA. At his visit with his PCP 12/17/20 he was still in afib and was started on diltiazem and Xarelto. Patient is s/p DCCV on 01/20/21 but unfortunately was back in afib by follow up on 01/24/21. He did not feel any different in SR.   Patient seen by Dr Lalla Brothers 03/26/21 and discussed ablation but patient deferred at that time. He was admitted 07/2021 for community acquired pneumonia and rapid atrial flutter. He remained in atrial flutter at his visit with Dr Bjorn Pippin on 08/20/21.  Patient was admitted 11/2021 after being found unresponsive by family. They described possible seizure-like activity. It is unclear whether he had witnessed syncope.  There is no report of prodromal chest pain or shortness of breath.  On EMS arrival he was unresponsive, was hypoglycemic (fingerstick blood sugar 48) and severely hypotensive (60/40 mmHg).  He received a liter of fluid, Narcan (without improvement) and intravenous dextrose.  His glucose increased to over 200 but he remained unresponsive.  His ECG shows diffuse ST segment depression in all leads except aVR where there is mild ST segment elevation.  Symptoms felt to be related to hypoglycemia. Ischemic workup was not pursued at that time. He was in afib but converted to SR prior to discharge. Patient was scheduled to have cardiac CT but was back in afib at the time and the scan was cancelled.   Patient is s/p dofetilide admission 12/12-12/15/23. He converted chemically but reverted back to afib prior to discharge. Prior to pt receiving meds or discharge instructions pt left AMA, without signing paperwork or even having his IV removed. Pt was becoming edgy and just decided he was ready to leave. EP team discussed importance of compliance with medication and follow up. He has converted to SR after discharge.   Patient returns for follow up for atrial fibrillation and atrial flutter. He remains in atrial flutter today. Difficult to see on his Kardia mobile device, suspect undersensing due to regular flutter. He does feel SOB but his is chronic for him. He does feel more fatigued.   Today, he denies symptoms of palpitations, chest pain, orthopnea, PND, dizziness, presyncope, syncope, snoring, daytime somnolence, bleeding, or neurologic sequela. The patient is tolerating medications without difficulties and is otherwise without complaint today.    Atrial Fibrillation Risk Factors:  he does not have symptoms or diagnosis of sleep apnea. he does not have a history of rheumatic fever. he does have a history of alcohol use. The patient does not have a history of early familial atrial fibrillation or other arrhythmias.   Atrial Fibrillation Management history:  Previous antiarrhythmic drugs: dofetilide  Previous cardioversions: 08/25/17, 01/20/21 Previous ablations: none Anticoagulation history: Xarelto    Past Medical History:  Diagnosis Date  Arthritis    Bladder cancer (HCC)    Chronic back pain    Chronic combined systolic and diastolic CHF (congestive heart failure) (HCC) 07/16/2021   COPD (chronic obstructive pulmonary disease) (HCC)     COPD with chronic bronchitis and emphysema (HCC)    DDD (degenerative disc disease)    Diabetic retinopathy of both eyes (HCC)    Essential hypertension    GERD (gastroesophageal reflux disease)    History of atrial flutter 02/07/2011   Converted to NSR with Cardizem   History of chronic bronchitis    History of hemolytic anemia 02/07/2011   secondary to Avelox   HOH (hard of hearing)    HOH (hard of hearing)    no eardrum and nerve damage on R, also HOH on L   Mild renal insufficiency 08/25/2017   Mitral valve prolapse    a. 2D Echo 11/27/14: EF 55-60%; images were inadequate for LV wall motion assessment, + mild late systolic mitral valve prolapse involving the anterior leaflet.   PAD (peripheral artery disease) (HCC) 04/09/2014   Dr Myra Gianotti; bilateral SFA occlusion, R mid, L distal   PAF (paroxysmal atrial fibrillation) (HCC)    a. Dx 11/2014 during admission for perf ulcer.   Perforated ulcer (HCC)    a. 11/2014 s/p surgery.   Productive cough    Smokers' cough (HCC)    Type 1 diabetes mellitus (HCC) 03/10/1975    Current Outpatient Medications  Medication Sig Dispense Refill   albuterol (VENTOLIN HFA) 108 (90 Base) MCG/ACT inhaler Inhale 2 puffs into the lungs every 6 (six) hours as needed for wheezing or shortness of breath. 18 g 2   cefUROXime (CEFTIN) 250 MG tablet Take 1 tablet (250 mg total) by mouth 2 (two) times daily with a meal. Take for the first week of rotating months (rotate with doxycycline) 14 tablet 5   cetirizine (ZYRTEC) 10 MG tablet Take 10 mg by mouth daily.     dofetilide (TIKOSYN) 250 MCG capsule TAKE 1 CAPSULE BY MOUTH TWICE A DAY 180 capsule 1   doxycycline (VIBRA-TABS) 100 MG tablet Take 1 tablet (100 mg total) by mouth 2 (two) times daily. Take for the first week of alternating months (alternating with cefuroxime) 14 tablet 5   empagliflozin (JARDIANCE) 10 MG TABS tablet Take 1 tablet (10 mg total) by mouth daily before breakfast.     famotidine  (PEPCID) 20 MG tablet Take 1 tablet (20 mg total) by mouth every morning. Reported on 04/16/2015 90 tablet 0   Fluticasone-Umeclidin-Vilant (TRELEGY ELLIPTA) 100-62.5-25 MCG/ACT AEPB INHALE 1 PUFF INTO THE LUNGS daily 180 each 3   furosemide (LASIX) 40 MG tablet TAKE 1 TABLET BY MOUTH DAILY AS NEEDED (WEIGHT GAIN OF 3 LBS OVERNIGHT OR 5 LBS IN A WEEK). 90 tablet 1   [START ON 07/10/2023] HYDROcodone-acetaminophen (NORCO/VICODIN) 5-325 MG tablet Take 1 tablet by mouth every 6 (six) hours as needed for moderate pain (pain score 4-6) or severe pain (pain score 7-10). 120 tablet 0   [START ON 06/10/2023] HYDROcodone-acetaminophen (NORCO/VICODIN) 5-325 MG tablet Take 1 tablet by mouth every 6 (six) hours as needed for moderate pain (pain score 4-6) or severe pain (pain score 7-10). 120 tablet 0   insulin aspart (NOVOLOG) 100 UNIT/ML injection PER SLIDING SCALE: 190 - 200 = 2 UNITS. 300 AND ABOVE = 7 UNITS. 10 mL 1   insulin glargine (LANTUS) 100 UNIT/ML injection INJECT 0.4 MLS (40 UNITS TOTAL) INTO THE SKIN IN THE MORNING. 30 mL 0  Insulin Syringe-Needle U-100 (B-D INS SYR ULTRAFINE 1CC/30G) 30G X 1/2" 1 ML MISC Use 4 times a day with insulin Dx E11.9 400 each 3   Insulin Syringes, Disposable, U-100 1 ML MISC Use 4 times a day for insulin injection Dx E11.9 400 each 5   ipratropium-albuterol (DUONEB) 0.5-2.5 (3) MG/3ML SOLN INHALE CONTENTS OF 1 VIAL VIA NEBULIZER EVERY 4 HOURS AS NEEDED 360 mL 5   levalbuterol (XOPENEX) 0.31 MG/3ML nebulizer solution INHALE 3 MLS (0.31 MG TOTAL) BY NEBULIZATION EVERY 4 (FOUR) HOURS AS NEEDED FOR WHEEZING. 1620 mL 1   losartan (COZAAR) 50 MG tablet Take 1 tablet (50 mg total) by mouth daily. 90 tablet 1   metoprolol succinate (TOPROL-XL) 50 MG 24 hr tablet Take 1.5 tablets (75 mg total) by mouth 2 (two) times daily. Take with or immediately following a meal. 270 tablet 2   naloxone (NARCAN) nasal spray 4 mg/0.1 mL Place 1 spray into the nose as needed (For opiate overdose). 1  each 2   OVER THE COUNTER MEDICATION Take 1-2 tablets by mouth See admin instructions. Super Beets gummies- Chew 1-2 gummies by mouth every day     OXYGEN Inhale 3 L/min into the lungs continuous.     rivaroxaban (XARELTO) 20 MG TABS tablet Take 1 tablet (20 mg total) by mouth daily with supper. 90 tablet 1   No current facility-administered medications for this encounter.    ROS- All systems are reviewed and negative except as per the HPI above.  Physical Exam: Vitals:   06/01/23 1411  BP: 90/60  Pulse: 93  Weight: 82.4 kg  Height: 5\' 8"  (1.727 m)     GEN: Well nourished, well developed in no acute distress NECK: No JVD; No carotid bruits CARDIAC: Irregularly irregular rate and rhythm, no murmurs, rubs, gallops RESPIRATORY:  Clear to auscultation without rales, wheezing or rhonchi, on nasal canula O2  ABDOMEN: Soft, non-tender, non-distended EXTREMITIES:  1+ pedal edema, No deformity    Wt Readings from Last 3 Encounters:  06/01/23 82.4 kg  05/12/23 80.7 kg  05/07/23 80.7 kg    EKG today demonstrates  Atypical atrial flutter with variable block Vent. rate 93 BPM PR interval * ms QRS duration 82 ms QT/QTcB 390/484 ms   Echo 05/21/21 demonstrated   1. Left ventricular ejection fraction, by estimation, is 55 to 60%. The  left ventricle has normal function. The left ventricle has no regional  wall motion abnormalities. Left ventricular diastolic parameters are  consistent with Grade II diastolic dysfunction (pseudonormalization). Elevated left ventricular end-diastolic pressure.   2. Right ventricular systolic function is normal. The right ventricular  size is normal. There is normal pulmonary artery systolic pressure.   3. The mitral valve is normal in structure. No evidence of mitral valve  regurgitation. No evidence of mitral stenosis.   4. The aortic valve is tricuspid. Aortic valve regurgitation is not  visualized. No aortic stenosis is present.   5. The inferior  vena cava is normal in size with greater than 50%  respiratory variability, suggesting right atrial pressure of 3 mmHg.   Epic records are reviewed at length today  CHA2DS2-VASc Score = 5  The patient's score is based upon: CHF History: 1 HTN History: 1 Diabetes History: 1 Stroke History: 0 Vascular Disease History: 1 Age Score: 1 Gender Score: 0       ASSESSMENT AND PLAN: Persistent Atrial Fibrillation/atrial flutter The patient's CHA2DS2-VASc score is 5, indicating a 7.2% annual risk of stroke.  S/p dofetilide admission 02/2022 Patient appears to be persistently in atrial flutter. We discussed rhythm control options. Will plan for DCCV. He is undergoing a colonoscopy on 4/9 for a positive Cologuard and anemia. Will tentatively schedule DCCV for 3 weeks post colonoscopy.  Continue dofetilide 250 mcg BID Continue Xarelto 20 mg daily Continue Toprol 75 mg BID Kardia mobile for home monitoring.   Secondary Hypercoagulable State (ICD10:  D68.69) The patient is at significant risk for stroke/thromboembolism based upon his CHA2DS2-VASc Score of 5.  Continue Rivaroxaban (Xarelto). No bleeding issues.   High Risk Medication Monitoring (ICD 10: Z79.899) QT interval on ECG appears acceptable for dofetilide monitoring, more difficult to measure in atrial flutter. Recent bmet/mag reviewed.   HFrecEF EF 55-60% GDMT per primary cardiology team Fluid status appears stable today  HTN Stable on current regimen  PAD Followed by Dr Myra Gianotti  CAD Severe 3 vessel disease seen on CT PET scan showed small area of ischemia, felt to be low risk No anginal symptoms Followed by Dr Bjorn Pippin   Follow up in the AF clinic 4/23 as scheduled.    Informed Consent   Shared Decision Making/Informed Consent The risks (stroke, cardiac arrhythmias rarely resulting in the need for a temporary or permanent pacemaker, skin irritation or burns and complications associated with conscious sedation  including aspiration, arrhythmia, respiratory failure and death), benefits (restoration of normal sinus rhythm) and alternatives of a direct current cardioversion were explained in detail to Thomas Sandoval and he agrees to proceed.        Jorja Loa PA-C [facility] [address] [place], [place] [address] [phone] 06/01/2023 2:54 PM

## 2023-06-08 ENCOUNTER — Other Ambulatory Visit: Payer: Self-pay | Admitting: Cardiology

## 2023-06-08 DIAGNOSIS — I4819 Other persistent atrial fibrillation: Secondary | ICD-10-CM

## 2023-06-08 NOTE — Telephone Encounter (Signed)
 Xarelto 20mg  refill request received. Pt is 68 years old, weight-82.4kg, Crea-1.09 on 05/13/23, last seen by Clint Fenton on 06/01/23, Diagnosis-Afib, CrCl-78.75 mL/min; Dose is appropriate based on dosing criteria. Will send in refill to requested pharmacy.

## 2023-06-09 ENCOUNTER — Other Ambulatory Visit (HOSPITAL_COMMUNITY): Payer: Self-pay

## 2023-06-09 ENCOUNTER — Other Ambulatory Visit: Payer: Self-pay | Admitting: Physical Medicine and Rehabilitation

## 2023-06-09 ENCOUNTER — Encounter (HOSPITAL_COMMUNITY): Payer: Self-pay | Admitting: Internal Medicine

## 2023-06-09 NOTE — Progress Notes (Signed)
 Pre op call eval Name:  Thomas Sandoval  PCP- [name] Daphine Deutscher FNP Cardiologist- Bjorn Pippin MD Pulmonologist- Delton Coombes MD   Chest xray- 12/03/22 EKG-06/01/23 Echo-05/21/21 Stress Test-n/a Cath-n/a Blood thinner-Xarelto 2 day hold GLP-1-n/a  Hx: CHF, HTN, PVD, Renal insufficiency, A.Flutter, Perforated ulcer, COPD, Emphysema, Type 1 DM. Last saw cardiology 06/01/23, planning on cardioversion needs to be a few weeks post colonoscopy due to blood thinner hold. Last saw pulm. 03/23/23 , still severe symptoms with chronic bronchitis. Currently did pre call with wife- she says he is on 3L02 currently through the day and feels like breathing is stable no new issues, feels a little crummy from Afib as awaiting cardioversion.   Anesthesia Review: Yes- as long as COPD stable approved- per wife his breathing is stable.

## 2023-06-09 NOTE — Telephone Encounter (Signed)
 I sent this script to [facility] to be filled 4/3 and 5/3 per my last visit note.

## 2023-06-10 ENCOUNTER — Other Ambulatory Visit (HOSPITAL_COMMUNITY): Payer: Self-pay

## 2023-06-10 ENCOUNTER — Telehealth: Payer: Self-pay | Admitting: Physical Medicine and Rehabilitation

## 2023-06-10 DIAGNOSIS — H43813 Vitreous degeneration, bilateral: Secondary | ICD-10-CM | POA: Diagnosis not present

## 2023-06-10 DIAGNOSIS — E113512 Type 2 diabetes mellitus with proliferative diabetic retinopathy with macular edema, left eye: Secondary | ICD-10-CM | POA: Diagnosis not present

## 2023-06-10 DIAGNOSIS — H4312 Vitreous hemorrhage, left eye: Secondary | ICD-10-CM | POA: Diagnosis not present

## 2023-06-10 MED ORDER — HYDROCODONE-ACETAMINOPHEN 5-325 MG PO TABS
1.0000 | ORAL_TABLET | Freq: Four times a day (QID) | ORAL | 0 refills | Status: AC | PRN
Start: 2023-06-10 — End: 2023-07-10
  Filled 2023-06-10: qty 120, 30d supply, fill #0

## 2023-06-10 MED ORDER — HYDROCODONE-ACETAMINOPHEN 5-325 MG PO TABS
1.0000 | ORAL_TABLET | Freq: Four times a day (QID) | ORAL | 0 refills | Status: DC | PRN
  Filled 2023-07-12: qty 120, 30d supply, fill #0

## 2023-06-10 NOTE — Telephone Encounter (Signed)
 Patient would like to know why the medication hydrocodone was denied, per [facility].  Patient would like a phone call [phone].

## 2023-06-10 NOTE — Addendum Note (Signed)
 Addended by: Elijah Birk on: 06/10/2023 01:58 PM   Modules accepted: Orders

## 2023-06-10 NOTE — Telephone Encounter (Signed)
 It was sent to [facility] already; I can move it over to [place] along with his next script if that's what he wants. I will place those orders now!

## 2023-06-11 NOTE — Telephone Encounter (Signed)
 Procedure:EGD Procedure date: 06/16/23 Procedure location: [facility] Arrival Time: 8:30 Spoke with the patient Y/N: N Any prep concerns? N  Has the patient obtained the prep from the pharmacy ? N Do you have a care partner and transportation: N Any additional concerns? N   I called twice and got the voice mail on the third time I left a detail message  for the patient about there procedure

## 2023-06-12 ENCOUNTER — Other Ambulatory Visit (HOSPITAL_COMMUNITY): Payer: Self-pay

## 2023-06-14 ENCOUNTER — Telehealth: Payer: Self-pay | Admitting: Physician Assistant

## 2023-06-14 NOTE — Telephone Encounter (Signed)
 Inbound call from patient's wife, wishing to see if patient can have pepto bismol prior to patient's procedure on 4/9.

## 2023-06-14 NOTE — Telephone Encounter (Signed)
 Spoke to patient's wife and said I would avoid pepto bismol if at all possible, even today, in case it made it harder for him to get cleaned out tomorrow.  She agreed

## 2023-06-16 ENCOUNTER — Ambulatory Visit (HOSPITAL_BASED_OUTPATIENT_CLINIC_OR_DEPARTMENT_OTHER): Admitting: Anesthesiology

## 2023-06-16 ENCOUNTER — Ambulatory Visit (HOSPITAL_COMMUNITY): Admitting: Anesthesiology

## 2023-06-16 ENCOUNTER — Encounter (HOSPITAL_COMMUNITY): Payer: Self-pay | Admitting: Internal Medicine

## 2023-06-16 ENCOUNTER — Encounter (HOSPITAL_COMMUNITY)
Admission: RE | Disposition: A | Discharge status: Home / Self Care | Payer: Self-pay | Source: Home / Self Care | Attending: Internal Medicine

## 2023-06-16 ENCOUNTER — Ambulatory Visit (HOSPITAL_COMMUNITY)
Admission: RE | Admit: 2023-06-16 | Discharge: 2023-06-16 | Disposition: A | Discharge status: Home / Self Care | Payer: Medicare Other | Attending: Internal Medicine | Admitting: Internal Medicine

## 2023-06-16 DIAGNOSIS — N189 Chronic kidney disease, unspecified: Secondary | ICD-10-CM | POA: Diagnosis not present

## 2023-06-16 DIAGNOSIS — E1022 Type 1 diabetes mellitus with diabetic chronic kidney disease: Secondary | ICD-10-CM | POA: Diagnosis not present

## 2023-06-16 DIAGNOSIS — Z8711 Personal history of peptic ulcer disease: Secondary | ICD-10-CM | POA: Diagnosis not present

## 2023-06-16 DIAGNOSIS — R195 Other fecal abnormalities: Secondary | ICD-10-CM | POA: Diagnosis not present

## 2023-06-16 DIAGNOSIS — I5042 Chronic combined systolic (congestive) and diastolic (congestive) heart failure: Secondary | ICD-10-CM | POA: Insufficient documentation

## 2023-06-16 DIAGNOSIS — D509 Iron deficiency anemia, unspecified: Secondary | ICD-10-CM | POA: Diagnosis not present

## 2023-06-16 DIAGNOSIS — Z7901 Long term (current) use of anticoagulants: Secondary | ICD-10-CM | POA: Diagnosis not present

## 2023-06-16 DIAGNOSIS — K219 Gastro-esophageal reflux disease without esophagitis: Secondary | ICD-10-CM | POA: Diagnosis not present

## 2023-06-16 DIAGNOSIS — Z1211 Encounter for screening for malignant neoplasm of colon: Secondary | ICD-10-CM

## 2023-06-16 DIAGNOSIS — K2100 Gastro-esophageal reflux disease with esophagitis, without bleeding: Secondary | ICD-10-CM | POA: Insufficient documentation

## 2023-06-16 DIAGNOSIS — I130 Hypertensive heart and chronic kidney disease with heart failure and stage 1 through stage 4 chronic kidney disease, or unspecified chronic kidney disease: Secondary | ICD-10-CM | POA: Insufficient documentation

## 2023-06-16 DIAGNOSIS — E1051 Type 1 diabetes mellitus with diabetic peripheral angiopathy without gangrene: Secondary | ICD-10-CM | POA: Insufficient documentation

## 2023-06-16 DIAGNOSIS — I4892 Unspecified atrial flutter: Secondary | ICD-10-CM | POA: Diagnosis not present

## 2023-06-16 DIAGNOSIS — Z794 Long term (current) use of insulin: Secondary | ICD-10-CM | POA: Diagnosis not present

## 2023-06-16 DIAGNOSIS — J449 Chronic obstructive pulmonary disease, unspecified: Secondary | ICD-10-CM | POA: Diagnosis not present

## 2023-06-16 DIAGNOSIS — K222 Esophageal obstruction: Secondary | ICD-10-CM | POA: Diagnosis not present

## 2023-06-16 DIAGNOSIS — Z9981 Dependence on supplemental oxygen: Secondary | ICD-10-CM | POA: Insufficient documentation

## 2023-06-16 DIAGNOSIS — I110 Hypertensive heart disease with heart failure: Secondary | ICD-10-CM | POA: Diagnosis not present

## 2023-06-16 DIAGNOSIS — K449 Diaphragmatic hernia without obstruction or gangrene: Secondary | ICD-10-CM

## 2023-06-16 DIAGNOSIS — Z7984 Long term (current) use of oral hypoglycemic drugs: Secondary | ICD-10-CM | POA: Insufficient documentation

## 2023-06-16 HISTORY — PX: COLONOSCOPY WITH PROPOFOL: SHX5780

## 2023-06-16 HISTORY — PX: ESOPHAGOGASTRODUODENOSCOPY (EGD) WITH PROPOFOL: SHX5813

## 2023-06-16 HISTORY — PX: BIOPSY OF SKIN SUBCUTANEOUS TISSUE AND/OR MUCOUS MEMBRANE: SHX6741

## 2023-06-16 LAB — GLUCOSE, CAPILLARY: Glucose-Capillary: 130 mg/dL — ABNORMAL HIGH (ref 70–99)

## 2023-06-16 SURGERY — COLONOSCOPY WITH PROPOFOL
Anesthesia: Monitor Anesthesia Care

## 2023-06-16 MED ORDER — FLEET ENEMA RE ENEM
2.0000 | ENEMA | Freq: Once | RECTAL | Status: AC
  Administered 2023-06-16: 2 via RECTAL

## 2023-06-16 MED ORDER — PROPOFOL 500 MG/50ML IV EMUL
INTRAVENOUS | Status: DC | PRN
  Administered 2023-06-16: 50 mg via INTRAVENOUS
  Administered 2023-06-16: 100 ug/kg/min via INTRAVENOUS

## 2023-06-16 MED ORDER — ESMOLOL HCL 100 MG/10ML IV SOLN
INTRAVENOUS | Status: DC | PRN
Start: 2023-06-16 — End: 2023-06-16
  Administered 2023-06-16 (×3): 20 mg via INTRAVENOUS

## 2023-06-16 MED ORDER — PHENYLEPHRINE 80 MCG/ML (10ML) SYRINGE FOR IV PUSH (FOR BLOOD PRESSURE SUPPORT)
PREFILLED_SYRINGE | INTRAVENOUS | Status: DC | PRN
Start: 2023-06-16 — End: 2023-06-16
  Administered 2023-06-16: 80 ug via INTRAVENOUS
  Administered 2023-06-16: 160 ug via INTRAVENOUS
  Administered 2023-06-16: 240 ug via INTRAVENOUS

## 2023-06-16 MED ORDER — SODIUM CHLORIDE 0.9 % IV SOLN: INTRAVENOUS | Status: DC | PRN

## 2023-06-16 MED ORDER — PROPOFOL 1000 MG/100ML IV EMUL
INTRAVENOUS | Status: AC
  Filled 2023-06-16: qty 100

## 2023-06-16 MED ORDER — FLEET ENEMA RE ENEM
ENEMA | RECTAL | Status: AC
  Filled 2023-06-16: qty 2

## 2023-06-16 MED ORDER — SODIUM CHLORIDE 0.9% FLUSH
3.0000 mL | Freq: Two times a day (BID) | INTRAVENOUS | Status: DC
Start: 2023-06-16 — End: 2023-06-16

## 2023-06-16 MED ORDER — SODIUM CHLORIDE 0.9% FLUSH
3.0000 mL | INTRAVENOUS | Status: DC | PRN
Start: 2023-06-16 — End: 2023-06-16

## 2023-06-16 SURGICAL SUPPLY — 23 items
BLOCK BITE 60FR ADLT L/F BLUE (MISCELLANEOUS) ×3 IMPLANT
ELECT REM PT RETURN 9FT ADLT (ELECTROSURGICAL) IMPLANT
ELECTRODE REM PT RTRN 9FT ADLT (ELECTROSURGICAL) IMPLANT
FLOOR PAD 36X40 (MISCELLANEOUS) ×2 IMPLANT
FORCEP RJ3 GP 1.8X160 W-NEEDLE (CUTTING FORCEPS) IMPLANT
FORCEPS BIOP RAD 4 LRG CAP 4 (CUTTING FORCEPS) IMPLANT
FORCEPS BIOP RJ4 240 W/NDL (CUTTING FORCEPS) IMPLANT
FORCEPS BXJMBJMB 240X2.8X (CUTTING FORCEPS) IMPLANT
INJECTOR/SNARE I SNARE (MISCELLANEOUS) IMPLANT
LUBRICANT JELLY 4.5OZ STERILE (MISCELLANEOUS) IMPLANT
MANIFOLD NEPTUNE II (INSTRUMENTS) IMPLANT
NDL SCLEROTHERAPY 25GX240 (NEEDLE) IMPLANT
NEEDLE SCLEROTHERAPY 25GX240 (NEEDLE) IMPLANT
PAD FLOOR 36X40 (MISCELLANEOUS) ×3 IMPLANT
PROBE APC STR FIRE (PROBE) IMPLANT
PROBE INJECTION GOLD 7FR (MISCELLANEOUS) IMPLANT
SNARE ROTATE MED OVAL 20MM (MISCELLANEOUS) IMPLANT
SNARE SHORT THROW 13M SML OVAL (MISCELLANEOUS) IMPLANT
SYR 50ML LL SCALE MARK (SYRINGE) IMPLANT
TRAP SPECIMEN MUCOUS 40CC (MISCELLANEOUS) IMPLANT
TUBING ENDO SMARTCAP PENTAX (MISCELLANEOUS) ×6 IMPLANT
TUBING IRRIGATION ENDOGATOR (MISCELLANEOUS) ×3 IMPLANT
WATER STERILE IRR 1000ML POUR (IV SOLUTION) IMPLANT

## 2023-06-16 NOTE — Anesthesia Preprocedure Evaluation (Addendum)
 Anesthesia Evaluation  Patient identified by MRN, date of birth, ID band Patient awake    Reviewed: Allergy & Precautions, NPO status , Patient's Chart, lab work & pertinent test results, reviewed documented beta blocker date and time   History of Anesthesia Complications Negative for: history of anesthetic complications  Airway Mallampati: II  TM Distance: >3 FB Neck ROM: Full    Dental  (+) Poor Dentition, Missing, Loose, Dental Advisory Given   Pulmonary COPD,  COPD inhaler and oxygen dependent, Current SmokerPatient did not abstain from smoking.   breath sounds clear to auscultation       Cardiovascular hypertension, Pt. on medications and Pt. on home beta blockers (-) angina + Peripheral Vascular Disease  + dysrhythmias Atrial Fibrillation  Rhythm:Regular Rate:Normal  '23 ECHO: EF 55 to 60%.  1. The LV has normal function, no regional wall motion abnormalities. Grade II diastolic  dysfunction (pseudonormalization). Elevated left ventricular end-diastolic  pressure.   2. RVF is normal. The right ventricular size is normal. There is normal pulmonary artery systolic pressure.   3. The mitral valve is normal in structure. No evidence of mitral valve regurgitation. No evidence of mitral stenosis.   4. The aortic valve is tricuspid. Aortic valve regurgitation is not visualized. No aortic stenosis is present.     Neuro/Psych negative neurological ROS     GI/Hepatic Neg liver ROS,GERD  Medicated and Controlled,,  Endo/Other  diabetes, Insulin Dependent    Renal/GU Renal InsufficiencyRenal disease   Bladder cancer    Musculoskeletal  (+) Arthritis ,    Abdominal   Peds  Hematology Xarelto: last dose Sunday 06/13/2023   Anesthesia Other Findings   Reproductive/Obstetrics                             Anesthesia Physical Anesthesia Plan  ASA: 3  Anesthesia Plan: MAC   Post-op Pain  Management: Minimal or no pain anticipated   Induction:   PONV Risk Score and Plan: 0  Airway Management Planned: Natural Airway and Simple Face Mask  Additional Equipment: None  Intra-op Plan:   Post-operative Plan:   Informed Consent: I have reviewed the patients History and Physical, chart, labs and discussed the procedure including the risks, benefits and alternatives for the proposed anesthesia with the patient or authorized representative who has indicated his/her understanding and acceptance.     Dental advisory given  Plan Discussed with: CRNA and Surgeon  Anesthesia Plan Comments: (Discussed with patient and his wife)        Anesthesia Quick Evaluation

## 2023-06-16 NOTE — H&P (Signed)
 Expand All Collapse All    Chief Complaint: Positive Cologuard   HPI:    [name] is a 68 year old male with a past medical history as listed below including bladder cancer, chronic oxygen use, COPD, GERD, chronic combined systolic and diastolic CHF, A-fib on Xarelto (05/21/2021 echo with LVEF 55-60%) and multiple others, who was referred to me by Thomas Sandoval, Thomas Sandoval, * for a complaint of ulcerative Cologuard.    2014 patient seen by our service in the hospital for nausea and vomiting with possible coffee-ground emesis by Dr. Russella Dar.  He was scheduled for an EGD.  EGD with moderately severe gastritis and gastric cardia secondary to vomiting, healed gastric ulcer in the antrum.    12/30/2022 Cologuard positive.    03/16/2023 CMP with a creatinine of 1.52 and otherwise normal, CBC with a hemoglobin of 10.9 (13.4 on 09/29/2022).  PCP recommended a daily iron supplement and Hemoccult cards.    Today, the patient presents to clinic accompanied by his wife who assists with history, and tells me that he was unaware of any anemia.  Apparently he has been on iron low daily for the past couple of months.  Does tell me he has not seen any GI bleeding.  Aware of his positive Cologuard test.  Wondering how we are going to do his colonoscopy with him on oxygen.  Also tells me he can go a couple of days without a bowel movement but then he will have 3-4, this has been normal for him and he never feels bloated or abdominal pain.    Does briefly discussed his chronic back pain as well as oxygen use and inability to really exercise.    Denies fever, chills, weight loss, abdominal pain or change in bowel habits.       Past Medical History:  Diagnosis Date   Arthritis     Bladder cancer (HCC)     Chronic back pain     Chronic combined systolic and diastolic CHF (congestive heart failure) (HCC) 07/16/2021   COPD (chronic obstructive pulmonary disease) (HCC)     COPD with chronic bronchitis and emphysema  (HCC)     DDD (degenerative disc disease)     Diabetic retinopathy of both eyes (HCC)     Essential hypertension     GERD (gastroesophageal reflux disease)     History of atrial flutter 02/07/2011    Converted to NSR with Cardizem   History of chronic bronchitis     History of hemolytic anemia 02/07/2011    secondary to Avelox   HOH (hard of hearing)     HOH (hard of hearing)      no eardrum and nerve damage on R, also HOH on L   Mild renal insufficiency 08/25/2017   Mitral valve prolapse      a. 2D Echo 11/27/14: EF 55-60%; images were inadequate for LV wall motion assessment, + mild late systolic mitral valve prolapse involving the anterior leaflet.   PAD (peripheral artery disease) (HCC) 04/09/2014    Dr Myra Gianotti; bilateral SFA occlusion, R mid, L distal   PAF (paroxysmal atrial fibrillation) (HCC)      a. Dx 11/2014 during admission for perf ulcer.   Perforated ulcer (HCC)      a. 11/2014 s/p surgery.   Productive cough     Smokers' cough (HCC)     Type 1 diabetes mellitus (HCC) 03/10/1975               Past Surgical History:  Procedure Laterality Date   CARDIOVERSION N/A 01/20/2021    Procedure: CARDIOVERSION;  Surgeon: Pricilla Riffle, MD;  Location: [facility] ENDOSCOPY;  Service: Cardiovascular;  Laterality: N/A;   CYSTOSCOPY WITH URETEROSCOPY Right 08/14/2013    Procedure: CYSTOSCOPY WITH URETEROSCOPY BLADDER BIOPSY ;  Surgeon: Garnett Farm, MD;  Location: [facility];  Service: Urology;  Laterality: Right;   ESOPHAGOGASTRODUODENOSCOPY N/A 02/06/2013    Procedure: ESOPHAGOGASTRODUODENOSCOPY (EGD);  Surgeon: Hart Carwin, MD;  Location: [facility] ENDOSCOPY;  Service: Endoscopy;  Laterality: N/A;   LAPAROSCOPY N/A 11/25/2014    Procedure: LAPAROSCOPIC PRIMARY REPAIR OF PERFORATED PREPYLORIC ULCER WITH Peggye Ley;  Surgeon: Gaynelle Adu, MD;  Location: [facility] OR;  Service: General;  Laterality: N/A;   TONSILLECTOMY   as child   TRANSTHORACIC ECHOCARDIOGRAM   02-17-2011     MODERATE LVH/  EF 65%   TRANSURETHRAL RESECTION OF BLADDER TUMOR WITH GYRUS (TURBT-GYRUS) N/A 06/12/2013    Procedure: TRANSURETHRAL RESECTION OF BLADDER TUMOR WITH GYRUS (TURBT-GYRUS);  Surgeon: Garnett Farm, MD;  Location: [facility];  Service: Urology;  Laterality: N/A;   TYMPANIC MEMBRANE REPAIR   as child                Current Outpatient Medications  Medication Sig Dispense Refill   albuterol (VENTOLIN HFA) 108 (90 Base) MCG/ACT inhaler Inhale 2 puffs into the lungs every 6 (six) hours as needed for wheezing or shortness of breath. 18 g 2   cefUROXime (CEFTIN) 250 MG tablet Take 1 tablet (250 mg total) by mouth 2 (two) times daily with a meal. Take for the first week of rotating months (rotate with doxycycline) (Patient not taking: Reported on 04/19/2023) 14 tablet 5   cetirizine (ZYRTEC) 10 MG tablet Take 10 mg by mouth daily.       dofetilide (TIKOSYN) 250 MCG capsule TAKE 1 CAPSULE BY MOUTH TWICE A DAY 180 capsule 1   doxycycline (VIBRA-TABS) 100 MG tablet Take 1 tablet (100 mg total) by mouth 2 (two) times daily. Take for the first week of alternating months (alternating with cefuroxime) (Patient not taking: Reported on 04/19/2023) 14 tablet 5   empagliflozin (JARDIANCE) 10 MG TABS tablet Take 1 tablet (10 mg total) by mouth daily. 90 tablet 1   empagliflozin (JARDIANCE) 10 MG TABS tablet Take 1 tablet (10 mg total) by mouth daily before breakfast.       famotidine (PEPCID) 20 MG tablet Take 1 tablet (20 mg total) by mouth every morning. Reported on 04/16/2015 90 tablet 0   Fluticasone-Umeclidin-Vilant (TRELEGY ELLIPTA) 100-62.5-25 MCG/ACT AEPB INHALE 1 PUFF INTO THE LUNGS daily 180 each 3   furosemide (LASIX) 40 MG tablet TAKE 1 TABLET BY MOUTH DAILY AS NEEDED (WEIGHT GAIN OF 3 LBS OVERNIGHT OR 5 LBS IN A WEEK). 90 tablet 1   HYDROcodone-acetaminophen (NORCO) 5-325 MG tablet Take 1 tablet by mouth every 6 (six) hours as needed for moderate pain (pain score 4-6) or severe  pain (pain score 7-10). Do not fill prior to 02-11-2023 120 tablet 0   insulin aspart (NOVOLOG) 100 UNIT/ML injection PER SLIDING SCALE: 190 - 200 = 2 UNITS. 300 AND ABOVE = 7 UNITS. 10 mL 1   insulin glargine (LANTUS) 100 UNIT/ML injection INJECT 0.4 MLS (40 UNITS TOTAL) INTO THE SKIN IN THE MORNING. 30 mL 0   Insulin Syringe-Needle U-100 (B-D INS SYR ULTRAFINE 1CC/30G) 30G X 1/2" 1 ML MISC Use 4 times a day with insulin Dx E11.9 400 each 3  Insulin Syringes, Disposable, U-100 1 ML MISC Use 4 times a day for insulin injection Dx E11.9 400 each 5   ipratropium-albuterol (DUONEB) 0.5-2.5 (3) MG/3ML SOLN INHALE CONTENTS OF 1 VIAL VIA NEBULIZER EVERY 4 HOURS AS NEEDED 360 mL 5   levalbuterol (XOPENEX) 0.31 MG/3ML nebulizer solution TAKE 3 MLS (0.31 MG TOTAL) BY NEBULIZATION EVERY 4 (FOUR) HOURS AS NEEDED FOR WHEEZING. 1080 mL 1   losartan (COZAAR) 50 MG tablet Take 1 tablet (50 mg total) by mouth daily. 90 tablet 1   metoprolol succinate (TOPROL-XL) 50 MG 24 hr tablet Take 1.5 tablets (75 mg total) by mouth 2 (two) times daily. Take with or immediately following a meal. 270 tablet 2   naloxone (NARCAN) nasal spray 4 mg/0.1 mL Place 1 spray into the nose as needed (For opiate overdose). 1 each 2   OVER THE COUNTER MEDICATION Take 1-2 tablets by mouth See admin instructions. Super Beets gummies- Chew 1-2 gummies by mouth every day       OXYGEN Inhale 3 L/min into the lungs continuous.       rivaroxaban (XARELTO) 20 MG TABS tablet Take 1 tablet (20 mg total) by mouth daily with supper. 90 tablet 1      No current facility-administered medications for this visit.             Allergies as of 05/04/2023 - Review Complete 04/19/2023  Allergen Reaction Noted   Avelox [moxifloxacin hcl in nacl] Other (See Comments) 02/26/2011   Azithromycin Other (See Comments) and Nausea And Vomiting 02/22/2011   Bactrim [sulfamethoxazole-trimethoprim] Diarrhea and Nausea And Vomiting 02/10/2013           Family  History  Problem Relation Age of Onset   Breast cancer Mother     Cancer Mother          Breast   Rheumatic fever Father     Heart disease Father     Heart attack Father          Massive    Diabetes Son            Social History         Socioeconomic History   Marital status: Married      Spouse name: Not on file   Number of children: Not on file   Years of education: Not on file   Highest education level: Not on file  Occupational History   Occupation: Tobacco Visual merchandiser  Tobacco Use   Smoking status: Every Day      Current packs/day: 0.50      Average packs/day: 0.5 packs/day for 30.0 years (15.0 ttl pk-yrs)      Types: Cigarettes   Smokeless tobacco: Former      Quit date: 06/08/1978   Tobacco comments:      1/2 pk 03/23/23 NM, CMA   Vaping Use   Vaping status: Never Used  Substance and Sexual Activity   Alcohol use: Not Currently      Comment: 5 quarts per week 12/26/21   Drug use: No   Sexual activity: Not on file  Other Topics Concern   Not on file  Social History Narrative    Lives in [place] with wife and 2 sons.     Social Drivers of Acupuncturist Strain: Low Risk  (04/12/2023)    Overall Financial Resource Strain (CARDIA)     Difficulty of Paying Living Expenses: Not very hard  Food Insecurity: Low  Risk  (11/24/2022)    Received from [facility]    Hunger Vital Sign     Worried About Running Out of Food in the Last Year: Never true     Ran Out of Food in the Last Year: Never true  Transportation Needs: No Transportation Needs (11/24/2022)    Received from [employer]     In the past 12 months, has lack of reliable transportation kept you from medical appointments, meetings, work or from getting things needed for daily living? : No  Physical Activity: Inactive (04/12/2023)    Exercise Vital Sign     Days of Exercise per Week: 0 days     Minutes of Exercise per Session: 0 min  Stress: No Stress Concern Present  (04/12/2023)    [employer] of Occupational Health - Occupational Stress Questionnaire     Feeling of Stress : Not at all  Social Connections: Socially Isolated (04/12/2023)    Social Connection and Isolation Panel [NHANES]     Frequency of Communication with Friends and Family: Once a week     Frequency of Social Gatherings with Friends and Family: Never     Attends Religious Services: Never     Database administrator or Organizations: No     Attends Banker Meetings: Never     Marital Status: Married  Catering manager Violence: Not At Risk (04/12/2023)    Humiliation, Afraid, Rape, and Kick questionnaire     Fear of Current or Ex-Partner: No     Emotionally Abused: No     Physically Abused: No     Sexually Abused: No      Review of Systems:    Constitutional: No weight loss, fever or chills Skin: No rash  Cardiovascular: No chest pain Respiratory: +chronic SOB Gastrointestinal: See HPI and otherwise negative Genitourinary: No dysuria  Neurological: No headache, dizziness or syncope Musculoskeletal: No new muscle or joint pain Hematologic: No bleeding  Psychiatric: No history of depression or anxiety    Physical Exam:  Vital signs: BP 118/78   Pulse 62   Ht 5\' 8"  (1.727 m)   Wt 181 lb 6 oz (82.3 kg)   BMI 27.58 kg/m     Constitutional:   Pleasant Caucasian male appears to be in NAD, Well developed, Well nourished, alert and cooperative Head:  Normocephalic and atraumatic. Eyes:   PEERL, EOMI. No icterus. Conjunctiva pink. Ears: +VERY HOH Neck:  Supple Throat: Oral cavity and pharynx without inflammation, swelling or lesion.  Respiratory: Respirations even and unlabored. Lungs clear to auscultation bilaterally.   No wheezes, crackles, or rhonchi. + O2 via nasal cannula Cardiovascular: Normal S1, S2. No MRG. Regular rate and rhythm. No peripheral edema, cyanosis or pallor.  Gastrointestinal:  Soft, nondistended, nontender. No rebound or guarding. Normal  bowel sounds. No appreciable masses or hepatomegaly. Rectal:  Not performed.  Msk:  Symmetrical without gross deformities. Without edema, no deformity or joint abnormality.  Neurologic:  Alert and  oriented x4;  grossly normal neurologically.  Skin:   Dry and intact without significant lesions or rashes. Psychiatric: Demonstrates good judgement and reason without abnormal affect or behaviors.   RELEVANT LABS AND IMAGING: CBC Labs (Brief)          Component Value Date/Time    WBC 7.1 03/16/2023 1436    WBC 10.8 (H) 11/27/2021 1045    RBC 3.49 (L) 03/16/2023 1436    RBC 4.26 11/27/2021 1045  HGB 10.9 (L) 03/16/2023 1436    HCT 34.7 (L) 03/16/2023 1436    PLT 245 03/16/2023 1436    MCV 99 (H) 03/16/2023 1436    MCH 31.2 03/16/2023 1436    MCH 33.8 11/27/2021 1045    MCHC 31.4 (L) 03/16/2023 1436    MCHC 33.0 11/27/2021 1045    RDW 12.8 03/16/2023 1436    LYMPHSABS 1.3 03/16/2023 1436    MONOABS 1.1 (H) 11/27/2021 1045    EOSABS 0.3 03/16/2023 1436    BASOSABS 0.1 03/16/2023 1436        CMP     Labs (Brief)          Component Value Date/Time    NA 141 03/16/2023 1436    K 3.8 03/16/2023 1436    CL 99 03/16/2023 1436    CO2 30 (H) 03/16/2023 1436    GLUCOSE 85 03/16/2023 1436    GLUCOSE 105 (H) 12/29/2022 1340    BUN 19 03/16/2023 1436    CREATININE 1.52 (H) 03/16/2023 1436    CREATININE 0.80 12/07/2014 1643    CALCIUM 9.3 03/16/2023 1436    PROT 6.6 03/16/2023 1436    ALBUMIN 4.0 03/16/2023 1436    AST 15 03/16/2023 1436    ALT 8 03/16/2023 1436    ALKPHOS 69 03/16/2023 1436    BILITOT 0.3 03/16/2023 1436    GFRNONAA 50 (L) 12/29/2022 1340    GFRAA 72 05/01/2020 1507        Assessment: 1.  Positive Cologuard: Recently positive Cologuard, no prior colonoscopy 2.  IDA: Anemia noted recently on labs, patient has been on iron over the past couple of months, will recheck today and also check iron studies; consider relation to CKD +/- GI etiology 3.  CKD: To be  contributing to above 4.  COPD on chronic oxygen 5.  A-fib on Xarelto 6.  PAD   Plan: 1.  Scheduled patient for diagnostic EGD and colonoscopy at the hospital given his chronic oxygen use due to positive Cologuard and iron deficiency anemia.  Did provide the patient a detailed list of risks for the procedures and he agrees to proceed.  Patient is high risk given his multiple comorbidities. 2.  Patient advised to hold his Xarelto for 2 days prior to time of procedure.  We will communicate with his prescribing physician to ensure this is acceptable for him. 3.  Repeat labs today including CBC, CMP and iron studies 4.  Discussed positive Cologuard results and what this means. 5.  Patient to follow clinic with [place] per recommendations after time of procedures.   Hyacinth Meeker, PA-C [facility] 05/04/2023, 2:23 PM   Cc: Thomas Sandoval, Thomas Sandoval, *      Recent office H&P as above.  No interval change.  Receiving cleansing enemas preprocedure.  Now at the hospital for colonoscopy and upper endoscopy to evaluate iron deficiency anemia and positive Cologuard testing.

## 2023-06-16 NOTE — Op Note (Signed)
 [facility] Patient Name: Thomas Sandoval Procedure Date: 06/16/2023 MRN: [id number] Attending MD: Wilhemina Bonito. Marina Goodell , MD, [id number] Date of Birth: 1955-06-17 CSN: [id number] Age: 68 Admit Type: Outpatient Procedure:                Colonoscopy Indications:              Positive Cologuard test, Iron deficiency anemia Providers:                Wilhemina Bonito. Marina Goodell, MD, Fransisca Connors, Rhodia Albright,                            Technician Referring MD:             Audelia Acton, NP Medicines:                Monitored Anesthesia Care Complications:            No immediate complications. Estimated blood loss:                            None. Estimated Blood Loss:     Estimated blood loss: none. Procedure:                Pre-Anesthesia Assessment:                           - Prior to the procedure, a History and Physical                            was performed, and patient medications and                            allergies were reviewed. The patient's tolerance of                            previous anesthesia was also reviewed. The risks                            and benefits of the procedure and the sedation                            options and risks were discussed with the patient.                            All questions were answered, and informed consent                            was obtained. Prior Anticoagulants: The patient has                            taken Xarelto (rivaroxaban), last dose was 3 days                            prior to procedure. ASA Grade Assessment: III - A  patient with severe systemic disease. After                            reviewing the risks and benefits, the patient was                            deemed in satisfactory condition to undergo the                            procedure.                           After obtaining informed consent, the colonoscope                            was passed under direct  vision. Throughout the                            procedure, the patient's blood pressure, pulse, and                            oxygen saturations were monitored continuously. The                            [id number]-HQ190L ([id number]) Olympus colonoscope was                            introduced through the anus and advanced to the the                            cecum, identified by appendiceal orifice and                            ileocecal valve. The ileocecal valve, appendiceal                            orifice, and rectum were photographed. The quality                            of the bowel preparation was excellent. The                            colonoscopy was performed without difficulty. The                            patient tolerated the procedure well. The bowel                            preparation used was SUPREP via split dose                            instruction. Scope In: 10:51:56 AM Scope Out: 11:10:44 AM Scope Withdrawal Time: 0 hours 8 minutes 45 seconds  Total Procedure Duration: 0 hours 18 minutes 48 seconds  Findings:  The entire examined colon appeared normal on direct and retroflexion       views. Impression:               - The entire examined colon is normal on direct and                            retroflexion views.                           - No specimens collected. Moderate Sedation:      none Recommendation:           - Repeat colonoscopy is not recommended for                            surveillance.                           - Resume Xarelto (rivaroxaban) today at prior dose.                           - Patient has a contact number available for                            emergencies. The signs and symptoms of potential                            delayed complications were discussed with the                            patient. Return to normal activities tomorrow.                            Written discharge instructions were provided to the                             patient.                           - Resume previous diet.                           - Continue present medications.                           - See upper endoscopy for findings and                            recommendations                           - Return to the care of your primary provider                           Both procedures were reviewed with the patient and  his wife. They were provided copies of these reports Procedure Code(s):        --- Professional ---                           [phone], Colonoscopy, flexible; diagnostic, including                            collection of specimen(s) by brushing or washing,                            when performed (separate procedure) Diagnosis Code(s):        --- Professional ---                           R19.5, Other fecal abnormalities                           D50.9, Iron deficiency anemia, unspecified CPT copyright 2022 American Medical Association. All rights reserved. The codes documented in this report are preliminary and upon coder review may  be revised to meet current compliance requirements. Wilhemina Bonito. Marina Goodell, MD 06/16/2023 11:20:57 AM This report has been signed electronically. Number of Addenda: 0

## 2023-06-16 NOTE — Op Note (Signed)
 [facility] Patient Name: Thomas Sandoval Procedure Date: 06/16/2023 MRN: [id number] Attending MD: Wilhemina Bonito. Marina Goodell , MD, [id number] Date of Birth: 22-Apr-1955 CSN: [id number] Age: 68 Admit Type: Outpatient Procedure:                Upper GI endoscopy with biopsies Indications:              Iron deficiency anemia, Esophageal reflux Providers:                Wilhemina Bonito. Marina Goodell, MD, Fransisca Connors, Rhodia Albright,                            Technician Referring MD:             Bennie Pierini, NP Medicines:                Monitored Anesthesia Care Complications:            No immediate complications. Estimated Blood Loss:     Estimated blood loss: none. Procedure:                Pre-Anesthesia Assessment:                           - Prior to the procedure, a History and Physical                            was performed, and patient medications and                            allergies were reviewed. The patient's tolerance of                            previous anesthesia was also reviewed. The risks                            and benefits of the procedure and the sedation                            options and risks were discussed with the patient.                            All questions were answered, and informed consent                            was obtained. Prior Anticoagulants: The patient has                            taken Xarelto (rivaroxaban), last dose was 3 days                            prior to procedure. ASA Grade Assessment: III - A                            patient with severe systemic disease. After  reviewing the risks and benefits, the patient was                            deemed in satisfactory condition to undergo the                            procedure.                           After obtaining informed consent, the endoscope was                            passed under direct vision. Throughout the                             procedure, the patient's blood pressure, pulse, and                            oxygen saturations were monitored continuously. The                            [id number]-[id number] ([id number]) Olympus endoscope was introduced                            through the mouth, and advanced to the second part                            of duodenum. The upper GI endoscopy was                            accomplished without difficulty. The patient                            tolerated the procedure well. Scope In: Scope Out: Findings:      The esophagus revealed mild distal esophagitis as manifested by edema       and friability. Consistent with reflux. There was associated large       caliber benign ringlike stricture. No Barrett's.. Biopsies were taken       with a cold forceps for histology.      The stomach revealed a sliding hiatal hernia but was otherwise normal.      The examined duodenum deformity in the region of the bulb/D2 junction       consistent with a history of ulcer disease. No active ulceration.      The cardia and gastric fundus were normal on retroflexion. Impression:               1. GERD with esophagitis and esophageal stricture                            status post biopsies                           2. Deformity of the duodenal bulb consistent with a  prior history of ulcer disease.                           3. Multiple comorbidities on chronic                            anticoagulation. Moderate Sedation:      none Recommendation:           1. Resume previous diet                           2. PRESCRIBE PANTOPRAZOLE 40 mg daily; #30; 11                            refills. Please take this medicine once daily every                            day to heal esophageal inflammation and reduce the                            risk of recurrent ulcers.                           3. Colonoscopy today. See report for findings and                            final  recommendations.                           4. Await pathology results. Procedure Code(s):        --- Professional ---                           [phone], Esophagogastroduodenoscopy, flexible,                            transoral; with biopsy, single or multiple Diagnosis Code(s):        --- Professional ---                           D50.9, Iron deficiency anemia, unspecified                           K21.9, Gastro-esophageal reflux disease without                            esophagitis CPT copyright 2022 American Medical Association. All rights reserved. The codes documented in this report are preliminary and upon coder review may  be revised to meet current compliance requirements. Wilhemina Bonito. Marina Goodell, MD 06/16/2023 10:47:39 AM This report has been signed electronically. Number of Addenda: 0

## 2023-06-16 NOTE — Transfer of Care (Signed)
 Immediate Anesthesia Transfer of Care Note  Patient: Rylie Knierim.  Procedure(s) Performed: COLONOSCOPY WITH PROPOFOL ESOPHAGOGASTRODUODENOSCOPY (EGD) WITH PROPOFOL BIOPSY, SKIN, SUBCUTANEOUS TISSUE, OR MUCOUS MEMBRANE  Patient Location: PACU and Endoscopy Unit  Anesthesia Type:MAC  Level of Consciousness: sedated  Airway & Oxygen Therapy: Patient Spontanous Breathing and Patient connected to face mask oxygen  Post-op Assessment: Report given to RN and Post -op Vital signs reviewed and stable  Post vital signs: Reviewed and stable  Last Vitals:  Vitals Value Taken Time  BP    Temp    Pulse 76 06/16/23 1118  Resp 22 06/16/23 1118  SpO2 100 % 06/16/23 1118  Vitals shown include unfiled device data.  Last Pain:  Vitals:   06/16/23 0904  TempSrc: Temporal  PainSc: 0-No pain         Complications: No notable events documented.

## 2023-06-16 NOTE — Anesthesia Procedure Notes (Signed)
 Procedure Name: MAC Date/Time: 06/16/2023 10:21 AM  Performed by: Maurene Capes, CRNAPre-anesthesia Checklist: Patient identified, Emergency Drugs available, Suction available, Patient being monitored and Timeout performed Patient Re-evaluated:Patient Re-evaluated prior to induction Oxygen Delivery Method: Simple face mask Preoxygenation: Pre-oxygenation with 100% oxygen Induction Type: IV induction Placement Confirmation: positive ETCO2 Dental Injury: Teeth and Oropharynx as per pre-operative assessment

## 2023-06-16 NOTE — Anesthesia Postprocedure Evaluation (Signed)
 Anesthesia Post Note  Patient: Thomas Sandoval.  Procedure(s) Performed: COLONOSCOPY WITH PROPOFOL ESOPHAGOGASTRODUODENOSCOPY (EGD) WITH PROPOFOL BIOPSY, SKIN, SUBCUTANEOUS TISSUE, OR MUCOUS MEMBRANE     Patient location during evaluation: Endoscopy Anesthesia Type: MAC Level of consciousness: awake and alert, patient cooperative and oriented Pain management: pain level controlled Vital Signs Assessment: post-procedure vital signs reviewed and stable Respiratory status: spontaneous breathing, nonlabored ventilation, respiratory function stable and patient connected to nasal cannula oxygen Cardiovascular status: stable and blood pressure returned to baseline Postop Assessment: no apparent nausea or vomiting Anesthetic complications: no   No notable events documented.  Last Vitals:  Vitals:   06/16/23 0904 06/16/23 1118  BP: (!) 140/81 (!) 111/56  Pulse: (!) 103 76  Resp: (!) 21 20  Temp: 36.7 C 36.5 C  SpO2: 100% 100%    Last Pain:  Vitals:   06/16/23 1118  TempSrc: Oral  PainSc:                  Jamielyn Petrucci,E. Patryck Kilgore

## 2023-06-16 NOTE — Discharge Instructions (Signed)

## 2023-06-17 ENCOUNTER — Encounter: Payer: Self-pay | Admitting: Internal Medicine

## 2023-06-17 ENCOUNTER — Other Ambulatory Visit: Payer: Self-pay

## 2023-06-17 LAB — SURGICAL PATHOLOGY

## 2023-06-17 MED ORDER — PANTOPRAZOLE SODIUM 40 MG PO TBEC: 40.0000 mg | DELAYED_RELEASE_TABLET | Freq: Every day | ORAL | 11 refills | Status: DC

## 2023-06-17 NOTE — Telephone Encounter (Signed)
 Inbound call from patient's wife, would like to know when pantoprazole medication will get called in. States she called pharmacy and they have not received it.

## 2023-06-17 NOTE — Telephone Encounter (Signed)
Script sent to pharmacy, pt aware. 

## 2023-06-17 NOTE — Telephone Encounter (Signed)
 This pt had his procedure at [facility], not [facility]. Please route to Dr. Lamar Sprinkles nurse.

## 2023-06-20 ENCOUNTER — Encounter (HOSPITAL_COMMUNITY): Payer: Self-pay | Admitting: Internal Medicine

## 2023-06-20 DIAGNOSIS — J9601 Acute respiratory failure with hypoxia: Secondary | ICD-10-CM | POA: Diagnosis not present

## 2023-06-21 ENCOUNTER — Telehealth: Payer: Self-pay | Admitting: Internal Medicine

## 2023-06-21 MED ORDER — OMEPRAZOLE 40 MG PO CPDR: 40.0000 mg | DELAYED_RELEASE_CAPSULE | Freq: Every day | ORAL | 11 refills | Status: DC

## 2023-06-21 NOTE — Telephone Encounter (Signed)
 Updated rx for omeprazole sent to pharmacy.  Patient's wife, Christiane Cowing advised.

## 2023-06-21 NOTE — Telephone Encounter (Signed)
 Patient spouse states patient is having difficulty swallowing protonix medication. Also sates the medication is making the patient nauseous Requesting a different prescription. Please advise.   Thank you

## 2023-06-21 NOTE — Telephone Encounter (Signed)
 Rx prescribed following EGD 06/16/23. What sig for omeprazole?

## 2023-06-21 NOTE — Telephone Encounter (Signed)
 Patient's wife calls stating that patient is unable to swallow his pantoprazole pill and feels that his symptoms are worse on the medication than off.  I then spoke to the patient himself and asked if he was only having difficulty swallowing pantoprazole in particular or other medications as well. Patient clarifies that he does not have any problems at all swallowing the pill but insists that his belching and reflux is actually much worse on the pantoprazole than previously.   Patient requests a different medication for reflux. Please advise.Thomas AasAaron Sandoval

## 2023-06-30 ENCOUNTER — Encounter (HOSPITAL_COMMUNITY): Payer: Self-pay | Admitting: Physician Assistant

## 2023-06-30 ENCOUNTER — Ambulatory Visit (HOSPITAL_COMMUNITY)
Admission: RE | Admit: 2023-06-30 | Discharge: 2023-06-30 | Disposition: A | Discharge status: Home / Self Care | Payer: Medicare HMO | Source: Ambulatory Visit | Attending: Physician Assistant | Admitting: Physician Assistant

## 2023-06-30 VITALS — BP 128/76 | HR 101 | Ht 68.0 in | Wt 179.4 lb

## 2023-06-30 DIAGNOSIS — Z79899 Other long term (current) drug therapy: Secondary | ICD-10-CM | POA: Insufficient documentation

## 2023-06-30 DIAGNOSIS — D6869 Other thrombophilia: Secondary | ICD-10-CM | POA: Diagnosis not present

## 2023-06-30 DIAGNOSIS — Z5181 Encounter for therapeutic drug level monitoring: Secondary | ICD-10-CM | POA: Diagnosis not present

## 2023-06-30 DIAGNOSIS — I110 Hypertensive heart disease with heart failure: Secondary | ICD-10-CM | POA: Diagnosis not present

## 2023-06-30 DIAGNOSIS — Z794 Long term (current) use of insulin: Secondary | ICD-10-CM | POA: Diagnosis not present

## 2023-06-30 DIAGNOSIS — I2510 Atherosclerotic heart disease of native coronary artery without angina pectoris: Secondary | ICD-10-CM | POA: Insufficient documentation

## 2023-06-30 DIAGNOSIS — I5022 Chronic systolic (congestive) heart failure: Secondary | ICD-10-CM | POA: Insufficient documentation

## 2023-06-30 DIAGNOSIS — Z7984 Long term (current) use of oral hypoglycemic drugs: Secondary | ICD-10-CM | POA: Insufficient documentation

## 2023-06-30 DIAGNOSIS — I484 Atypical atrial flutter: Secondary | ICD-10-CM | POA: Diagnosis not present

## 2023-06-30 DIAGNOSIS — E119 Type 2 diabetes mellitus without complications: Secondary | ICD-10-CM | POA: Diagnosis not present

## 2023-06-30 DIAGNOSIS — I4819 Other persistent atrial fibrillation: Secondary | ICD-10-CM | POA: Insufficient documentation

## 2023-06-30 DIAGNOSIS — I4892 Unspecified atrial flutter: Secondary | ICD-10-CM | POA: Diagnosis not present

## 2023-06-30 DIAGNOSIS — I739 Peripheral vascular disease, unspecified: Secondary | ICD-10-CM | POA: Diagnosis not present

## 2023-06-30 LAB — BASIC METABOLIC PANEL WITH GFR
Anion gap: 11 (ref 5–15)
BUN: 18 mg/dL (ref 8–23)
CO2: 27 mmol/L (ref 22–32)
Calcium: 9.3 mg/dL (ref 8.9–10.3)
Chloride: 100 mmol/L (ref 98–111)
Creatinine, Ser: 1.19 mg/dL (ref 0.61–1.24)
GFR, Estimated: >60 mL/min (ref >60)
Glucose, Bld: 77 mg/dL (ref 70–99)
Potassium: 3.7 mmol/L (ref 3.5–5.1)
Sodium: 138 mmol/L (ref 135–145)

## 2023-06-30 LAB — CBC
HCT: 32.4 % — ABNORMAL LOW (ref 39.0–52.0)
Hemoglobin: 9.8 g/dL — ABNORMAL LOW (ref 13.0–17.0)
MCH: 30.9 pg (ref 26.0–34.0)
MCHC: 30.2 g/dL (ref 30.0–36.0)
MCV: 102.2 fL — ABNORMAL HIGH (ref 80.0–100.0)
Platelets: 244 10*3/uL (ref 150–400)
RBC: 3.17 MIL/uL — ABNORMAL LOW (ref 4.22–5.81)
RDW: 14.8 % (ref 11.5–15.5)
WBC: 7.3 10*3/uL (ref 4.0–10.5)
nRBC: 0 % (ref 0.0–0.2)

## 2023-06-30 LAB — MAGNESIUM: Magnesium: 2 mg/dL (ref 1.7–2.4)

## 2023-06-30 NOTE — Progress Notes (Signed)
 Primary Care Physician: Delfina Feller, FNP Primary Cardiologist: Dr Alda Amas Primary Electrophysiologist: Dr Marven Slimmer Referring Physician: Evalyn Hillier NP   Thomas Sandoval. is a 68 y.o. male with a history of alcohol abuse, tobacco abuse, COPD, MVP, perforated gastric ulcer s/p repair 2016, DM, HTN, PAD (bilateral fem-pop occlusion), GERD, systolic CHF, atrial flutter, atrial fibrillation who presents for follow up in the [facility]. The patient was initially diagnosed with atrial flutter in 2012 and afib in the setting of his perforated ulcer. Patient has a CHADS2VASC score of 4, has declined anticoagulation in the past. He was hospitalized 9/26-9/30/22 with acute alcoholic pancreatitis and was also found to be in afib at the time. Echo showed EF 30-35%. He left the hospital AMA. At his visit with his PCP 12/17/20 he was still in afib and was started on diltiazem  and Xarelto . Patient is s/p DCCV on 01/20/21 but unfortunately was back in afib by follow up on 01/24/21. He did not feel any different in SR.   Patient seen by Dr Marven Slimmer 03/26/21 and discussed ablation but patient deferred at that time. He was admitted 07/2021 for community acquired pneumonia and rapid atrial flutter. He remained in atrial flutter at his visit with Dr Alda Amas on 08/20/21.  Patient was admitted 11/2021 after being found unresponsive by family. They described possible seizure-like activity. It is unclear whether he had witnessed syncope.  There is no report of prodromal chest pain or shortness of breath.  On EMS arrival he was unresponsive, was hypoglycemic (fingerstick blood sugar 48) and severely hypotensive (60/40 mmHg).  He received a liter of fluid, Narcan  (without improvement) and intravenous dextrose .  His glucose increased to over 200 but he remained unresponsive.  His ECG shows diffuse ST segment depression in all leads except aVR where there is mild ST segment elevation.  Symptoms felt to be related to hypoglycemia. Ischemic workup was not pursued at that time. He was in afib but converted to SR prior to discharge. Patient was scheduled to have cardiac CT but was back in afib at the time and the scan was cancelled.   Patient is s/p dofetilide  admission 12/12-12/15/23. He converted chemically but reverted back to afib prior to discharge. Prior to pt receiving meds or discharge instructions pt left AMA, without signing paperwork or even having his IV removed. Pt was becoming edgy and just decided he was ready to leave. EP team discussed importance of compliance with medication and follow up. He has converted to SR after discharge.   Patient returns for follow up for atrial fibrillation, atrial flutter, and dofetilide  monitoring. Patient remains in atrial flutter. He is scheduled for DCCV on 07/12/23. He has not missed any doses of anticoagulation since resuming post colonoscopy. He states that he "feels bad all the time". He his fatigued and SOB.   Today, he  denies symptoms of palpitations, chest pain, orthopnea, PND, lower extremity edema, dizziness, presyncope, syncope, snoring, daytime somnolence, bleeding, or neurologic sequela. The patient is tolerating medications without difficulties and is otherwise without complaint today.    Atrial Fibrillation Risk Factors:  he does not have symptoms or diagnosis of sleep apnea. he does not have a history of rheumatic fever. he does have a history of alcohol use. The patient does not have a history of early familial atrial fibrillation or other arrhythmias.   Atrial Fibrillation Management history:  Previous antiarrhythmic drugs: dofetilide   Previous cardioversions: 08/25/17, 01/20/21 Previous ablations: none Anticoagulation history: Xarelto   Past Medical History:  Diagnosis Date   Arthritis    Bladder cancer (HCC)    Chronic back pain    Chronic combined systolic and diastolic CHF (congestive heart failure)  (HCC) 07/16/2021   COPD (chronic obstructive pulmonary disease) (HCC)    COPD with chronic bronchitis and emphysema (HCC)    DDD (degenerative disc disease)    Diabetic retinopathy of both eyes (HCC)    Essential hypertension    GERD (gastroesophageal reflux disease)    History of atrial flutter 02/07/2011   Converted to NSR with Cardizem    History of chronic bronchitis    History of hemolytic anemia 02/07/2011   secondary to Avelox    HOH (hard of hearing)    HOH (hard of hearing)    no eardrum and nerve damage on R, also HOH on L   Mild renal insufficiency 08/25/2017   Mitral valve prolapse    a. 2D Echo 11/27/14: EF 55-60%; images were inadequate for LV wall motion assessment, + mild late systolic mitral valve prolapse involving the anterior leaflet.   PAD (peripheral artery disease) (HCC) 04/09/2014   Dr Charlotte Cookey; bilateral SFA occlusion, R mid, L distal   PAF (paroxysmal atrial fibrillation) (HCC)    a. Dx 11/2014 during admission for perf ulcer.   Perforated ulcer (HCC)    a. 11/2014 s/p surgery.   Productive cough    Smokers' cough (HCC)    Type 1 diabetes mellitus (HCC) 03/10/1975    Current Outpatient Medications  Medication Sig Dispense Refill   albuterol  (VENTOLIN  HFA) 108 (90 Base) MCG/ACT inhaler Inhale 2 puffs into the lungs every 6 (six) hours as needed for wheezing or shortness of breath. 18 g 2   cefUROXime  (CEFTIN ) 250 MG tablet Take 1 tablet (250 mg total) by mouth 2 (two) times daily with a meal. Take for the first week of rotating months (rotate with doxycycline ) 14 tablet 5   cetirizine (ZYRTEC) 10 MG tablet Take 10 mg by mouth daily.     dofetilide  (TIKOSYN ) 250 MCG capsule TAKE 1 CAPSULE BY MOUTH TWICE A DAY 180 capsule 1   doxycycline  (VIBRA -TABS) 100 MG tablet Take 1 tablet (100 mg total) by mouth 2 (two) times daily. Take for the first week of alternating months (alternating with cefuroxime ) 14 tablet 5   empagliflozin  (JARDIANCE ) 10 MG TABS tablet Take 1  tablet (10 mg total) by mouth daily before breakfast.     famotidine  (PEPCID ) 20 MG tablet Take 1 tablet (20 mg total) by mouth every morning. Reported on 04/16/2015 90 tablet 0   Fluticasone -Umeclidin-Vilant (TRELEGY ELLIPTA ) 100-62.5-25 MCG/ACT AEPB INHALE 1 PUFF INTO THE LUNGS daily 180 each 3   furosemide  (LASIX ) 40 MG tablet TAKE 1 TABLET BY MOUTH DAILY AS NEEDED (WEIGHT GAIN OF 3 LBS OVERNIGHT OR 5 LBS IN A WEEK). 90 tablet 1   [START ON 07/10/2023] HYDROcodone -acetaminophen  (NORCO/VICODIN) 5-325 MG tablet Take 1 tablet by mouth every 6 (six) hours as needed for moderate pain (pain score 4-6) or severe pain (pain score 7-10). 120 tablet 0   HYDROcodone -acetaminophen  (NORCO/VICODIN) 5-325 MG tablet Take 1 tablet by mouth every 6 (six) hours as needed for moderate pain (pain score 4-6) or severe pain (pain score 7-10). 120 tablet 0   insulin  aspart (NOVOLOG ) 100 UNIT/ML injection PER SLIDING SCALE: 190 - 200 = 2 UNITS. 300 AND ABOVE = 7 UNITS. 10 mL 1   insulin  glargine (LANTUS ) 100 UNIT/ML injection INJECT 0.4 MLS (40 UNITS TOTAL) INTO THE SKIN IN  THE MORNING. 30 mL 0   Insulin  Syringe-Needle U-100 (B-D INS SYR ULTRAFINE 1CC/30G) 30G X 1/2" 1 ML MISC Use 4 times a day with insulin  Dx E11.9 400 each 3   Insulin  Syringes, Disposable, U-100 1 ML MISC Use 4 times a day for insulin  injection Dx E11.9 400 each 5   ipratropium-albuterol  (DUONEB) 0.5-2.5 (3) MG/3ML SOLN INHALE CONTENTS OF 1 VIAL VIA NEBULIZER EVERY 4 HOURS AS NEEDED 360 mL 5   levalbuterol  (XOPENEX ) 0.31 MG/3ML nebulizer solution INHALE 3 MLS (0.31 MG TOTAL) BY NEBULIZATION EVERY 4 (FOUR) HOURS AS NEEDED FOR WHEEZING. 1620 mL 1   losartan  (COZAAR ) 50 MG tablet Take 1 tablet (50 mg total) by mouth daily. 90 tablet 1   metoprolol  succinate (TOPROL -XL) 50 MG 24 hr tablet Take 1.5 tablets (75 mg total) by mouth 2 (two) times daily. Take with or immediately following a meal. 270 tablet 2   naloxone  (NARCAN ) nasal spray 4 mg/0.1 mL Place 1 spray  into the nose as needed (For opiate overdose). 1 each 2   omeprazole  (PRILOSEC) 40 MG capsule Take 1 capsule (40 mg total) by mouth daily. 30 capsule 11   OVER THE COUNTER MEDICATION Take 1-2 tablets by mouth See admin instructions. Super Beets gummies- Chew 1-2 gummies by mouth every day     OXYGEN  Inhale 3 L/min into the lungs continuous.     rivaroxaban  (XARELTO ) 20 MG TABS tablet TAKE 1 TABLET BY MOUTH DAILY  WITH SUPPER 100 tablet 1   No current facility-administered medications for this encounter.    ROS- All systems are reviewed and negative except as per the HPI above.  Physical Exam: Vitals:   06/30/23 1341  BP: 128/76  Pulse: (!) 101  Weight: 81.4 kg  Height: 5\' 8"  (1.727 m)     GEN: Well nourished, well developed in no acute distress CARDIAC: Irregularly irregular rate and rhythm, no murmurs, rubs, gallops RESPIRATORY:  faint crackles bilaterally, on nasal canula O2  ABDOMEN: Soft, non-tender, non-distended EXTREMITIES:  No edema; No deformity    Wt Readings from Last 3 Encounters:  06/30/23 81.4 kg  06/16/23 83.4 kg  06/01/23 82.4 kg    EKG today demonstrates  Atypical atrial flutter with variable block Vent. rate 101 BPM PR interval * ms QRS duration 84 ms QT/QTcB 378/490 ms   Echo 05/21/21 demonstrated   1. Left ventricular ejection fraction, by estimation, is 55 to 60%. The  left ventricle has normal function. The left ventricle has no regional  wall motion abnormalities. Left ventricular diastolic parameters are  consistent with Grade II diastolic dysfunction (pseudonormalization). Elevated left ventricular end-diastolic pressure.   2. Right ventricular systolic function is normal. The right ventricular  size is normal. There is normal pulmonary artery systolic pressure.   3. The mitral valve is normal in structure. No evidence of mitral valve  regurgitation. No evidence of mitral stenosis.   4. The aortic valve is tricuspid. Aortic valve  regurgitation is not  visualized. No aortic stenosis is present.   5. The inferior vena cava is normal in size with greater than 50%  respiratory variability, suggesting right atrial pressure of 3 mmHg.   Epic records are reviewed at length today  CHA2DS2-VASc Score = 5  The patient's score is based upon: CHF History: 1 HTN History: 1 Diabetes History: 1 Stroke History: 0 Vascular Disease History: 1 Age Score: 1 Gender Score: 0       ASSESSMENT AND PLAN: Persistent Atrial Fibrillation/atrial flutter The  patient's CHA2DS2-VASc score is 5, indicating a 7.2% annual risk of stroke.   S/p dofetilide  admission 02/2022 Patient remains in atrial flutter. Scheduled for DCCV on 07/12/23 Check bmet/cbc/mag today.  Continue dofetilide  250 mcg BID Continue Xarelto  20 mg daily Continue Toprol  75 mg BID  Secondary Hypercoagulable State (ICD10:  D68.69) The patient is at significant risk for stroke/thromboembolism based upon his CHA2DS2-VASc Score of 5.  Continue Rivaroxaban  (Xarelto ). No bleeding issues.   High Risk Medication Monitoring (ICD 10: Z79.899) QT interval on ECG acceptable for dofetilide  monitoring. Check bmet/mag/cbc today.    HFrecEF EF 55-60% GDMT per primary cardiology team Fluid status appears stable today  HTN Stable on current regimen  PAD Followed by Dr Charlotte Cookey  CAD Severe 3 vessel disease seen on CT PET scan showed small area of ischemia, felt to be low risk No anginal symptoms Followed by Dr Alda Amas   Follow up in the AF clinic post DCCV.    Informed Consent   Shared Decision Making/Informed Consent The risks (stroke, cardiac arrhythmias rarely resulting in the need for a temporary or permanent pacemaker, skin irritation or burns and complications associated with conscious sedation including aspiration, arrhythmia, respiratory failure and death), benefits (restoration of normal sinus rhythm) and alternatives of a direct current cardioversion were  explained in detail to Mr. Marcon and he agrees to proceed.        Myrtha Ates PA-C [facility] [address] [place], [place] [address] [phone] 06/30/2023 1:51 PM

## 2023-06-30 NOTE — Patient Instructions (Addendum)
 Cardioversion scheduled for: Monday May 5th   - Arrive at the [employer] [facility] of [facility] ([address]  and check in with ADMITTING at 6:30a.m.   - Do not eat or drink anything after midnight the night prior to your procedure.   - Take all your morning medication (except diabetic medications) with a sip of water  prior to arrival.  - You will not be able to drive home after your procedure. Please ensure you have a responsible adult to drive you home. You will need someone with you for 24 hours post procedure.    - Do NOT miss any doses of your blood thinner - if you should miss a dose or take a dose more than 4 hours late -- please notify our office immediately.    - Expect to be in the procedural area approximately 2 hours.   - If you feel as if you go back into normal rhythm prior to scheduled cardioversion, please notify our office immediately.   If your procedure is canceled in the cardioversion suite you will be charged a cancellation fee.    Hold below medications 72 hours prior to scheduled procedure/anesthesia. Restart medication on the following day after scheduled procedure/anesthesia Empagliflozin  (Jardiance )   **Patients on the above medications scheduled for elective procedures that have not held the medication for the appropriate amount of time are at risk of cancellation or change in the anesthetic plan.

## 2023-06-30 NOTE — H&P (View-Only) (Signed)
 Primary Care Physician: Delfina Feller, FNP Primary Cardiologist: Dr Alda Amas Primary Electrophysiologist: Dr Marven Slimmer Referring Physician: Evalyn Hillier NP   Thomas Sandoval. is a 68 y.o. male with a history of alcohol abuse, tobacco abuse, COPD, MVP, perforated gastric ulcer s/p repair 2016, DM, HTN, PAD (bilateral fem-pop occlusion), GERD, systolic CHF, atrial flutter, atrial fibrillation who presents for follow up in the [facility]. The patient was initially diagnosed with atrial flutter in 2012 and afib in the setting of his perforated ulcer. Patient has a CHADS2VASC score of 4, has declined anticoagulation in the past. He was hospitalized 9/26-9/30/22 with acute alcoholic pancreatitis and was also found to be in afib at the time. Echo showed EF 30-35%. He left the hospital AMA. At his visit with his PCP 12/17/20 he was still in afib and was started on diltiazem  and Xarelto . Patient is s/p DCCV on 01/20/21 but unfortunately was back in afib by follow up on 01/24/21. He did not feel any different in SR.   Patient seen by Dr Marven Slimmer 03/26/21 and discussed ablation but patient deferred at that time. He was admitted 07/2021 for community acquired pneumonia and rapid atrial flutter. He remained in atrial flutter at his visit with Dr Alda Amas on 08/20/21.  Patient was admitted 11/2021 after being found unresponsive by family. They described possible seizure-like activity. It is unclear whether he had witnessed syncope.  There is no report of prodromal chest pain or shortness of breath.  On EMS arrival he was unresponsive, was hypoglycemic (fingerstick blood sugar 48) and severely hypotensive (60/40 mmHg).  He received a liter of fluid, Narcan  (without improvement) and intravenous dextrose .  His glucose increased to over 200 but he remained unresponsive.  His ECG shows diffuse ST segment depression in all leads except aVR where there is mild ST segment elevation.  Symptoms felt to be related to hypoglycemia. Ischemic workup was not pursued at that time. He was in afib but converted to SR prior to discharge. Patient was scheduled to have cardiac CT but was back in afib at the time and the scan was cancelled.   Patient is s/p dofetilide  admission 12/12-12/15/23. He converted chemically but reverted back to afib prior to discharge. Prior to pt receiving meds or discharge instructions pt left AMA, without signing paperwork or even having his IV removed. Pt was becoming edgy and just decided he was ready to leave. EP team discussed importance of compliance with medication and follow up. He has converted to SR after discharge.   Patient returns for follow up for atrial fibrillation, atrial flutter, and dofetilide  monitoring. Patient remains in atrial flutter. He is scheduled for DCCV on 07/12/23. He has not missed any doses of anticoagulation since resuming post colonoscopy. He states that he "feels bad all the time". He his fatigued and SOB.   Today, he  denies symptoms of palpitations, chest pain, orthopnea, PND, lower extremity edema, dizziness, presyncope, syncope, snoring, daytime somnolence, bleeding, or neurologic sequela. The patient is tolerating medications without difficulties and is otherwise without complaint today.    Atrial Fibrillation Risk Factors:  he does not have symptoms or diagnosis of sleep apnea. he does not have a history of rheumatic fever. he does have a history of alcohol use. The patient does not have a history of early familial atrial fibrillation or other arrhythmias.   Atrial Fibrillation Management history:  Previous antiarrhythmic drugs: dofetilide   Previous cardioversions: 08/25/17, 01/20/21 Previous ablations: none Anticoagulation history: Xarelto   Past Medical History:  Diagnosis Date   Arthritis    Bladder cancer (HCC)    Chronic back pain    Chronic combined systolic and diastolic CHF (congestive heart failure)  (HCC) 07/16/2021   COPD (chronic obstructive pulmonary disease) (HCC)    COPD with chronic bronchitis and emphysema (HCC)    DDD (degenerative disc disease)    Diabetic retinopathy of both eyes (HCC)    Essential hypertension    GERD (gastroesophageal reflux disease)    History of atrial flutter 02/07/2011   Converted to NSR with Cardizem    History of chronic bronchitis    History of hemolytic anemia 02/07/2011   secondary to Avelox    HOH (hard of hearing)    HOH (hard of hearing)    no eardrum and nerve damage on R, also HOH on L   Mild renal insufficiency 08/25/2017   Mitral valve prolapse    a. 2D Echo 11/27/14: EF 55-60%; images were inadequate for LV wall motion assessment, + mild late systolic mitral valve prolapse involving the anterior leaflet.   PAD (peripheral artery disease) (HCC) 04/09/2014   Dr Charlotte Cookey; bilateral SFA occlusion, R mid, L distal   PAF (paroxysmal atrial fibrillation) (HCC)    a. Dx 11/2014 during admission for perf ulcer.   Perforated ulcer (HCC)    a. 11/2014 s/p surgery.   Productive cough    Smokers' cough (HCC)    Type 1 diabetes mellitus (HCC) 03/10/1975    Current Outpatient Medications  Medication Sig Dispense Refill   albuterol  (VENTOLIN  HFA) 108 (90 Base) MCG/ACT inhaler Inhale 2 puffs into the lungs every 6 (six) hours as needed for wheezing or shortness of breath. 18 g 2   cefUROXime  (CEFTIN ) 250 MG tablet Take 1 tablet (250 mg total) by mouth 2 (two) times daily with a meal. Take for the first week of rotating months (rotate with doxycycline ) 14 tablet 5   cetirizine (ZYRTEC) 10 MG tablet Take 10 mg by mouth daily.     dofetilide  (TIKOSYN ) 250 MCG capsule TAKE 1 CAPSULE BY MOUTH TWICE A DAY 180 capsule 1   doxycycline  (VIBRA -TABS) 100 MG tablet Take 1 tablet (100 mg total) by mouth 2 (two) times daily. Take for the first week of alternating months (alternating with cefuroxime ) 14 tablet 5   empagliflozin  (JARDIANCE ) 10 MG TABS tablet Take 1  tablet (10 mg total) by mouth daily before breakfast.     famotidine  (PEPCID ) 20 MG tablet Take 1 tablet (20 mg total) by mouth every morning. Reported on 04/16/2015 90 tablet 0   Fluticasone -Umeclidin-Vilant (TRELEGY ELLIPTA ) 100-62.5-25 MCG/ACT AEPB INHALE 1 PUFF INTO THE LUNGS daily 180 each 3   furosemide  (LASIX ) 40 MG tablet TAKE 1 TABLET BY MOUTH DAILY AS NEEDED (WEIGHT GAIN OF 3 LBS OVERNIGHT OR 5 LBS IN A WEEK). 90 tablet 1   [START ON 07/10/2023] HYDROcodone -acetaminophen  (NORCO/VICODIN) 5-325 MG tablet Take 1 tablet by mouth every 6 (six) hours as needed for moderate pain (pain score 4-6) or severe pain (pain score 7-10). 120 tablet 0   HYDROcodone -acetaminophen  (NORCO/VICODIN) 5-325 MG tablet Take 1 tablet by mouth every 6 (six) hours as needed for moderate pain (pain score 4-6) or severe pain (pain score 7-10). 120 tablet 0   insulin  aspart (NOVOLOG ) 100 UNIT/ML injection PER SLIDING SCALE: 190 - 200 = 2 UNITS. 300 AND ABOVE = 7 UNITS. 10 mL 1   insulin  glargine (LANTUS ) 100 UNIT/ML injection INJECT 0.4 MLS (40 UNITS TOTAL) INTO THE SKIN IN  THE MORNING. 30 mL 0   Insulin  Syringe-Needle U-100 (B-D INS SYR ULTRAFINE 1CC/30G) 30G X 1/2" 1 ML MISC Use 4 times a day with insulin  Dx E11.9 400 each 3   Insulin  Syringes, Disposable, U-100 1 ML MISC Use 4 times a day for insulin  injection Dx E11.9 400 each 5   ipratropium-albuterol  (DUONEB) 0.5-2.5 (3) MG/3ML SOLN INHALE CONTENTS OF 1 VIAL VIA NEBULIZER EVERY 4 HOURS AS NEEDED 360 mL 5   levalbuterol  (XOPENEX ) 0.31 MG/3ML nebulizer solution INHALE 3 MLS (0.31 MG TOTAL) BY NEBULIZATION EVERY 4 (FOUR) HOURS AS NEEDED FOR WHEEZING. 1620 mL 1   losartan  (COZAAR ) 50 MG tablet Take 1 tablet (50 mg total) by mouth daily. 90 tablet 1   metoprolol  succinate (TOPROL -XL) 50 MG 24 hr tablet Take 1.5 tablets (75 mg total) by mouth 2 (two) times daily. Take with or immediately following a meal. 270 tablet 2   naloxone  (NARCAN ) nasal spray 4 mg/0.1 mL Place 1 spray  into the nose as needed (For opiate overdose). 1 each 2   omeprazole  (PRILOSEC) 40 MG capsule Take 1 capsule (40 mg total) by mouth daily. 30 capsule 11   OVER THE COUNTER MEDICATION Take 1-2 tablets by mouth See admin instructions. Super Beets gummies- Chew 1-2 gummies by mouth every day     OXYGEN  Inhale 3 L/min into the lungs continuous.     rivaroxaban  (XARELTO ) 20 MG TABS tablet TAKE 1 TABLET BY MOUTH DAILY  WITH SUPPER 100 tablet 1   No current facility-administered medications for this encounter.    ROS- All systems are reviewed and negative except as per the HPI above.  Physical Exam: Vitals:   06/30/23 1341  BP: 128/76  Pulse: (!) 101  Weight: 81.4 kg  Height: 5\' 8"  (1.727 m)     GEN: Well nourished, well developed in no acute distress CARDIAC: Irregularly irregular rate and rhythm, no murmurs, rubs, gallops RESPIRATORY:  faint crackles bilaterally, on nasal canula O2  ABDOMEN: Soft, non-tender, non-distended EXTREMITIES:  No edema; No deformity    Wt Readings from Last 3 Encounters:  06/30/23 81.4 kg  06/16/23 83.4 kg  06/01/23 82.4 kg    EKG today demonstrates  Atypical atrial flutter with variable block Vent. rate 101 BPM PR interval * ms QRS duration 84 ms QT/QTcB 378/490 ms   Echo 05/21/21 demonstrated   1. Left ventricular ejection fraction, by estimation, is 55 to 60%. The  left ventricle has normal function. The left ventricle has no regional  wall motion abnormalities. Left ventricular diastolic parameters are  consistent with Grade II diastolic dysfunction (pseudonormalization). Elevated left ventricular end-diastolic pressure.   2. Right ventricular systolic function is normal. The right ventricular  size is normal. There is normal pulmonary artery systolic pressure.   3. The mitral valve is normal in structure. No evidence of mitral valve  regurgitation. No evidence of mitral stenosis.   4. The aortic valve is tricuspid. Aortic valve  regurgitation is not  visualized. No aortic stenosis is present.   5. The inferior vena cava is normal in size with greater than 50%  respiratory variability, suggesting right atrial pressure of 3 mmHg.   Epic records are reviewed at length today  CHA2DS2-VASc Score = 5  The patient's score is based upon: CHF History: 1 HTN History: 1 Diabetes History: 1 Stroke History: 0 Vascular Disease History: 1 Age Score: 1 Gender Score: 0       ASSESSMENT AND PLAN: Persistent Atrial Fibrillation/atrial flutter The  patient's CHA2DS2-VASc score is 5, indicating a 7.2% annual risk of stroke.   S/p dofetilide  admission 02/2022 Patient remains in atrial flutter. Scheduled for DCCV on 07/12/23 Check bmet/cbc/mag today.  Continue dofetilide  250 mcg BID Continue Xarelto  20 mg daily Continue Toprol  75 mg BID  Secondary Hypercoagulable State (ICD10:  D68.69) The patient is at significant risk for stroke/thromboembolism based upon his CHA2DS2-VASc Score of 5.  Continue Rivaroxaban  (Xarelto ). No bleeding issues.   High Risk Medication Monitoring (ICD 10: Z79.899) QT interval on ECG acceptable for dofetilide  monitoring. Check bmet/mag/cbc today.    HFrecEF EF 55-60% GDMT per primary cardiology team Fluid status appears stable today  HTN Stable on current regimen  PAD Followed by Dr Charlotte Cookey  CAD Severe 3 vessel disease seen on CT PET scan showed small area of ischemia, felt to be low risk No anginal symptoms Followed by Dr Alda Amas   Follow up in the AF clinic post DCCV.    Informed Consent   Shared Decision Making/Informed Consent The risks (stroke, cardiac arrhythmias rarely resulting in the need for a temporary or permanent pacemaker, skin irritation or burns and complications associated with conscious sedation including aspiration, arrhythmia, respiratory failure and death), benefits (restoration of normal sinus rhythm) and alternatives of a direct current cardioversion were  explained in detail to Mr. Marcon and he agrees to proceed.        Myrtha Ates PA-C [facility] [address] [place], [place] [address] [phone] 06/30/2023 1:51 PM

## 2023-07-01 NOTE — Telephone Encounter (Signed)
 I have spoken to patient and his wife. Patient says he took 3-4 days of omeprazole  and it caused his belching to be worse. "Spit up something that smells like it should have come out the other end" and wants an alternative medication. I explained that he needs to take the omeprazole  for longer than 3-4 days as the stomach/esophagus inflammation causing his symptoms will not heal over night. Patient asks "how long do I need to be a [place] pig before they give me something to help this." Again, explained that PPI is the standard treatment for reflux/ulcer disease/esophagitis as seen on recent exam but he will need to take this consistently for at least a couple weeks to notice any improvement of symptoms. Patient is also advised again of anti-reflux diet (including foods to avoid) and has been asked not to lay down within 3-4 hours of eating or drinking as to prevent refluxing. We discussed if his symptoms continue to worsen and do not improve with the current recommendations after the next 1-2 weeks, he should let us  know and we may be able to make some medication adjustments. He and his wife verbalize understanding.

## 2023-07-01 NOTE — Telephone Encounter (Signed)
 PT recently was prescribed omeprazole  and it is making his stomach upset. Wife is asking for an alternative. Please advise

## 2023-07-07 ENCOUNTER — Other Ambulatory Visit (HOSPITAL_COMMUNITY): Payer: Self-pay | Admitting: *Deleted

## 2023-07-07 DIAGNOSIS — I4819 Other persistent atrial fibrillation: Secondary | ICD-10-CM

## 2023-07-07 MED ORDER — POTASSIUM CHLORIDE ER 10 MEQ PO TBCR: 10.0000 meq | EXTENDED_RELEASE_TABLET | Freq: Every day | ORAL | 3 refills | Status: DC

## 2023-07-09 ENCOUNTER — Other Ambulatory Visit

## 2023-07-09 DIAGNOSIS — I4819 Other persistent atrial fibrillation: Secondary | ICD-10-CM | POA: Diagnosis not present

## 2023-07-09 NOTE — Progress Notes (Signed)
 Called patient with pre-procedure instructions for Monday May 5th  Patient informed of:   Time to arrive for procedure. 0630 Remain NPO past midnight.  Must have a ride home and a responsible adult to remain with them for 24 hours post procedure.  Confirmed blood thinner. Xarelto   Confirmed no breaks in taking blood thinner for 3+ weeks prior to procedure. Confirmed patient stopped all GLP-1s and GLP-2s for at least one week before procedure. Jardiance  07/08/23

## 2023-07-10 LAB — CBC
Hematocrit: 32 % — ABNORMAL LOW (ref 37.5–51.0)
Hemoglobin: 10.1 g/dL — ABNORMAL LOW (ref 13.0–17.7)
MCH: 30.8 pg (ref 26.6–33.0)
MCHC: 31.6 g/dL (ref 31.5–35.7)
MCV: 98 fL — ABNORMAL HIGH (ref 79–97)
Platelets: 291 10*3/uL (ref 150–450)
RBC: 3.28 x10E6/uL — ABNORMAL LOW (ref 4.14–5.80)
RDW: 13 % (ref 11.6–15.4)
WBC: 8.2 10*3/uL (ref 3.4–10.8)

## 2023-07-11 NOTE — Anesthesia Preprocedure Evaluation (Signed)
 Anesthesia Evaluation  Patient identified by MRN, date of birth, ID band Patient awake    Reviewed: Allergy & Precautions, NPO status , Patient's Chart, lab work & pertinent test results, reviewed documented beta blocker date and time   History of Anesthesia Complications Negative for: history of anesthetic complications  Airway Mallampati: II  TM Distance: >3 FB Neck ROM: Full    Dental  (+) Dental Advisory Given   Pulmonary COPD,  COPD inhaler and oxygen  dependent, Current SmokerPatient did not abstain from smoking.   breath sounds clear to auscultation       Cardiovascular hypertension, Pt. on medications and Pt. on home beta blockers (-) angina + Peripheral Vascular Disease and +CHF  + dysrhythmias Atrial Fibrillation  Rhythm:Irregular Rate:Tachycardia  '24 CT:    LV perfusion is abnormal. There is evidence of ischemia. There is no evidence of infarction. Defect 1: There is a small defect with mild reduction in uptake present in the apical to mid inferior and apex location(s) that is reversible. There is normal wall motion in the defect area. Consistent with ischemia. The defect is consistent with abnormal perfusion in the RCA territory.   Rest left ventricular function is normal. Rest EF: 81%. Stress left ventricular function is normal. Stress EF: 32%. End diastolic cavity size is normal. End systolic cavity size is normal.   Myocardial blood flow was computed to be 0.6ml/g/min at rest and 1.60ml/g/min at stress. Global myocardial blood flow reserve was 1.78 and was mildly abnormal.   Coronary calcium was present on the attenuation correction CT images. Severe coronary calcifications were present. Coronary calcifications were present in the left anterior descending artery, left circumflex artery and right coronary artery distribution(s).   Findings are consistent with ischemia. The study is low risk.    Neuro/Psych negative  neurological ROS     GI/Hepatic Neg liver ROS,GERD  Medicated and Controlled,,  Endo/Other  diabetes (glu 195), Oral Hypoglycemic Agents, Insulin  Dependent    Renal/GU Renal InsufficiencyRenal disease   Bladder cancer    Musculoskeletal   Abdominal   Peds  Hematology  (+) Blood dyscrasia (Hb 10.1, plt 291k), anemia Xarelto    Anesthesia Other Findings   Reproductive/Obstetrics                              Anesthesia Physical Anesthesia Plan  ASA: 4  Anesthesia Plan: General   Post-op Pain Management: Minimal or no pain anticipated   Induction: Intravenous  PONV Risk Score and Plan: 1 and Treatment may vary due to age or medical condition  Airway Management Planned: Natural Airway and Nasal Cannula  Additional Equipment: None  Intra-op Plan:   Post-operative Plan:   Informed Consent: I have reviewed the patients History and Physical, chart, labs and discussed the procedure including the risks, benefits and alternatives for the proposed anesthesia with the patient or authorized representative who has indicated his/her understanding and acceptance.     Dental advisory given  Plan Discussed with: CRNA and Surgeon  Anesthesia Plan Comments:          Anesthesia Quick Evaluation

## 2023-07-12 ENCOUNTER — Other Ambulatory Visit: Payer: Self-pay

## 2023-07-12 ENCOUNTER — Encounter (HOSPITAL_COMMUNITY)
Admission: RE | Disposition: A | Discharge status: Home / Self Care | Payer: Self-pay | Source: Home / Self Care | Attending: Internal Medicine

## 2023-07-12 ENCOUNTER — Encounter (HOSPITAL_COMMUNITY): Payer: Self-pay | Admitting: Internal Medicine

## 2023-07-12 ENCOUNTER — Ambulatory Visit (HOSPITAL_BASED_OUTPATIENT_CLINIC_OR_DEPARTMENT_OTHER): Admitting: Anesthesiology

## 2023-07-12 ENCOUNTER — Other Ambulatory Visit (HOSPITAL_COMMUNITY): Payer: Self-pay

## 2023-07-12 ENCOUNTER — Ambulatory Visit (HOSPITAL_COMMUNITY)
Admission: RE | Admit: 2023-07-12 | Discharge: 2023-07-12 | Disposition: A | Discharge status: Home / Self Care | Attending: Internal Medicine | Admitting: Internal Medicine

## 2023-07-12 ENCOUNTER — Ambulatory Visit (HOSPITAL_COMMUNITY): Admitting: Anesthesiology

## 2023-07-12 DIAGNOSIS — Z794 Long term (current) use of insulin: Secondary | ICD-10-CM | POA: Diagnosis not present

## 2023-07-12 DIAGNOSIS — I4819 Other persistent atrial fibrillation: Secondary | ICD-10-CM | POA: Diagnosis not present

## 2023-07-12 DIAGNOSIS — K219 Gastro-esophageal reflux disease without esophagitis: Secondary | ICD-10-CM | POA: Diagnosis not present

## 2023-07-12 DIAGNOSIS — I110 Hypertensive heart disease with heart failure: Secondary | ICD-10-CM | POA: Insufficient documentation

## 2023-07-12 DIAGNOSIS — F17210 Nicotine dependence, cigarettes, uncomplicated: Secondary | ICD-10-CM | POA: Diagnosis not present

## 2023-07-12 DIAGNOSIS — Z8551 Personal history of malignant neoplasm of bladder: Secondary | ICD-10-CM | POA: Diagnosis not present

## 2023-07-12 DIAGNOSIS — D6869 Other thrombophilia: Secondary | ICD-10-CM | POA: Diagnosis not present

## 2023-07-12 DIAGNOSIS — I4891 Unspecified atrial fibrillation: Secondary | ICD-10-CM

## 2023-07-12 DIAGNOSIS — Z79899 Other long term (current) drug therapy: Secondary | ICD-10-CM | POA: Diagnosis not present

## 2023-07-12 DIAGNOSIS — J449 Chronic obstructive pulmonary disease, unspecified: Secondary | ICD-10-CM | POA: Diagnosis not present

## 2023-07-12 DIAGNOSIS — E1151 Type 2 diabetes mellitus with diabetic peripheral angiopathy without gangrene: Secondary | ICD-10-CM | POA: Insufficient documentation

## 2023-07-12 DIAGNOSIS — I4892 Unspecified atrial flutter: Secondary | ICD-10-CM | POA: Insufficient documentation

## 2023-07-12 DIAGNOSIS — I2510 Atherosclerotic heart disease of native coronary artery without angina pectoris: Secondary | ICD-10-CM | POA: Diagnosis not present

## 2023-07-12 DIAGNOSIS — Z7984 Long term (current) use of oral hypoglycemic drugs: Secondary | ICD-10-CM | POA: Diagnosis not present

## 2023-07-12 DIAGNOSIS — Z7901 Long term (current) use of anticoagulants: Secondary | ICD-10-CM | POA: Diagnosis not present

## 2023-07-12 DIAGNOSIS — I341 Nonrheumatic mitral (valve) prolapse: Secondary | ICD-10-CM | POA: Diagnosis not present

## 2023-07-12 DIAGNOSIS — I5042 Chronic combined systolic (congestive) and diastolic (congestive) heart failure: Secondary | ICD-10-CM | POA: Diagnosis not present

## 2023-07-12 DIAGNOSIS — I5022 Chronic systolic (congestive) heart failure: Secondary | ICD-10-CM | POA: Diagnosis not present

## 2023-07-12 HISTORY — PX: CARDIOVERSION: EP1203

## 2023-07-12 LAB — GLUCOSE, CAPILLARY: Glucose-Capillary: 195 mg/dL — ABNORMAL HIGH (ref 70–99)

## 2023-07-12 SURGERY — CARDIOVERSION (CATH LAB)
Anesthesia: General

## 2023-07-12 MED ORDER — PROPOFOL 10 MG/ML IV BOLUS
INTRAVENOUS | Status: DC | PRN
  Administered 2023-07-12: 80 mg via INTRAVENOUS

## 2023-07-12 MED ORDER — SODIUM CHLORIDE 0.9% FLUSH: 3.0000 mL | Freq: Two times a day (BID) | INTRAVENOUS | Status: DC

## 2023-07-12 MED ORDER — LIDOCAINE 2% (20 MG/ML) 5 ML SYRINGE
INTRAMUSCULAR | Status: DC | PRN
  Administered 2023-07-12: 20 mg via INTRAVENOUS

## 2023-07-12 MED ORDER — SODIUM CHLORIDE 0.9% FLUSH: 3.0000 mL | INTRAVENOUS | Status: DC | PRN

## 2023-07-12 SURGICAL SUPPLY — 1 items: PAD DEFIB RADIO PHYSIO CONN (PAD) ×2 IMPLANT

## 2023-07-12 NOTE — Interval H&P Note (Signed)
 History and Physical Interval Note:  07/12/2023 7:45 AM  Thomas Rack.  has presented today for surgery, with the diagnosis of AFIB.  The various methods of treatment have been discussed with the patient and family. After consideration of risks, benefits and other options for treatment, the patient has consented to  Procedure(s): CARDIOVERSION (N/A) as a surgical intervention.  The patient's history has been reviewed, patient examined, no change in status, stable for surgery.  I have reviewed the patient's chart and labs.  Questions were answered to the patient's satisfaction.     [name]

## 2023-07-12 NOTE — Transfer of Care (Signed)
 Immediate Anesthesia Transfer of Care Note  Patient: Thomas Sandoval.  Procedure(s) Performed: CARDIOVERSION  Patient Location: PACU  Anesthesia Type:General  Level of Consciousness: drowsy  Airway & Oxygen  Therapy: Patient Spontanous Breathing and Patient connected to face mask oxygen   Post-op Assessment: Report given to RN and Post -op Vital signs reviewed and stable  Post vital signs: Reviewed and stable  Last Vitals:  Vitals Value Taken Time  BP    Temp    Pulse 58 07/12/23 0809  Resp 19 07/12/23 0809  SpO2 100 % 07/12/23 0809  Vitals shown include unfiled device data.  Last Pain:  Vitals:   07/12/23 0658  TempSrc: Temporal  PainSc: 0-No pain         Complications: No notable events documented.

## 2023-07-12 NOTE — Discharge Instructions (Signed)

## 2023-07-12 NOTE — CV Procedure (Signed)
    CARDIOVERSION NOTE  Procedure: Electrical Cardioversion Indications:  Atrial Fibrillation  Procedure Details:  Consent: Risks of procedure as well as the alternatives and risks of each were explained to the (patient/caregiver).  Consent for procedure obtained.  Time Out: Verified patient identification, verified procedure, site/side was marked, verified correct patient position, special equipment/implants available, medications/allergies/relevent history reviewed, required imaging and test results available.  Performed  Patient placed on cardiac monitor, pulse oximetry, supplemental oxygen  as necessary.  Sedation given:  propofol  per anesthesia Pacer pads placed anterior and posterior chest.  Cardioverted 1 time(s).  Cardioverted at 300J biphasic.  Impression: Findings: Post procedure EKG shows: NSR Complications: None Patient did tolerate procedure well.  Plan: Successful DCCV with a single 300J biphasic shock to NSR.  Time Spent Directly with the Patient:  30 minutes   [name], MD, [facility], FNLA, FACP  [place]  [facility]  Medical Director of the Advanced Lipid Disorders &  Cardiovascular Risk Reduction Clinic Diplomate of the American Board of Clinical Lipidology Attending Cardiologist  Direct Dial: [phone]  Fax: [phone]  Website:  [email or web address]  Aviva Lemmings Pier Bosher 07/12/2023, 8:09 AM

## 2023-07-12 NOTE — Anesthesia Postprocedure Evaluation (Signed)
 Anesthesia Post Note  Patient: Thomas Sandoval.  Procedure(s) Performed: CARDIOVERSION     Patient location during evaluation: Cath Lab Anesthesia Type: General Level of consciousness: awake and alert, patient cooperative and oriented Pain management: pain level controlled Vital Signs Assessment: post-procedure vital signs reviewed and stable Respiratory status: nonlabored ventilation, spontaneous breathing, respiratory function stable and patient connected to nasal cannula oxygen  Cardiovascular status: blood pressure returned to baseline and stable Postop Assessment: no apparent nausea or vomiting Anesthetic complications: no   No notable events documented.  Last Vitals:  Vitals:   07/12/23 0658 07/12/23 0810  BP: (!) 108/59 (!) 92/54  Pulse: 100 (!) 58  Resp: 20 19  Temp: 36.6 C 36.6 C  SpO2: 100% 100%    Last Pain:  Vitals:   07/12/23 0810  TempSrc: Temporal  PainSc: Asleep                 Kaylan Yates,E. [place]

## 2023-07-19 ENCOUNTER — Ambulatory Visit (HOSPITAL_COMMUNITY): Admitting: Physician Assistant

## 2023-07-20 DIAGNOSIS — J9601 Acute respiratory failure with hypoxia: Secondary | ICD-10-CM | POA: Diagnosis not present

## 2023-07-29 ENCOUNTER — Encounter: Payer: Self-pay | Admitting: Cardiology

## 2023-07-29 ENCOUNTER — Ambulatory Visit (HOSPITAL_COMMUNITY)
Admission: RE | Admit: 2023-07-29 | Discharge: 2023-07-29 | Disposition: A | Discharge status: Home / Self Care | Source: Ambulatory Visit | Attending: Physician Assistant | Admitting: Physician Assistant

## 2023-07-29 VITALS — BP 104/58 | HR 109 | Ht 68.0 in | Wt 179.4 lb

## 2023-07-29 DIAGNOSIS — I484 Atypical atrial flutter: Secondary | ICD-10-CM | POA: Diagnosis not present

## 2023-07-29 DIAGNOSIS — D6869 Other thrombophilia: Secondary | ICD-10-CM

## 2023-07-29 DIAGNOSIS — I5042 Chronic combined systolic (congestive) and diastolic (congestive) heart failure: Secondary | ICD-10-CM

## 2023-07-29 DIAGNOSIS — I4891 Unspecified atrial fibrillation: Secondary | ICD-10-CM | POA: Diagnosis not present

## 2023-07-29 DIAGNOSIS — I4819 Other persistent atrial fibrillation: Secondary | ICD-10-CM

## 2023-07-29 MED ORDER — METOPROLOL SUCCINATE ER 100 MG PO TB24: 100.0000 mg | ORAL_TABLET | Freq: Two times a day (BID) | ORAL | 2 refills | Status: DC

## 2023-07-29 NOTE — Progress Notes (Signed)
 Primary Care Physician: Delfina Feller, FNP Primary Cardiologist: Dr Alda Amas Primary Electrophysiologist: Dr Marven Slimmer Referring Physician: Evalyn Hillier NP   Thomas Sandoval. is a 68 y.o. male with a history of alcohol abuse, tobacco abuse, COPD, MVP, perforated gastric ulcer s/p repair 2016, DM, HTN, PAD (bilateral fem-pop occlusion), GERD, systolic CHF, atrial flutter, atrial fibrillation who presents for follow up in the [facility]. The patient was initially diagnosed with atrial flutter in 2012 and afib in the setting of his perforated ulcer. Patient has a CHADS2VASC score of 4, has declined anticoagulation in the past. He was hospitalized 9/26-9/30/22 with acute alcoholic pancreatitis and was also found to be in afib at the time. Echo showed EF 30-35%. He left the hospital AMA. At his visit with his PCP 12/17/20 he was still in afib and was started on diltiazem  and Xarelto . Patient is s/p DCCV on 01/20/21 but unfortunately was back in afib by follow up on 01/24/21. He did not feel any different in SR.   Patient seen by Dr Marven Slimmer 03/26/21 and discussed ablation but patient deferred at that time. He was admitted 07/2021 for community acquired pneumonia and rapid atrial flutter. He remained in atrial flutter at his visit with Dr Alda Amas on 08/20/21.  Patient was admitted 11/2021 after being found unresponsive by family. They described possible seizure-like activity. It is unclear whether he had witnessed syncope.  There is no report of prodromal chest pain or shortness of breath.  On EMS arrival he was unresponsive, was hypoglycemic (fingerstick blood sugar 48) and severely hypotensive (60/40 mmHg).  He received a liter of fluid, Narcan  (without improvement) and intravenous dextrose .  His glucose increased to over 200 but he remained unresponsive.  His ECG shows diffuse ST segment depression in all leads except aVR where there is mild ST segment elevation.  Symptoms felt to be related to hypoglycemia. Ischemic workup was not pursued at that time. He was in afib but converted to SR prior to discharge. Patient was scheduled to have cardiac CT but was back in afib at the time and the scan was cancelled.   Patient is s/p dofetilide  admission 12/12-12/15/23. He converted chemically but reverted back to afib prior to discharge. Prior to pt receiving meds or discharge instructions pt left AMA, without signing paperwork or even having his IV removed. Pt was becoming edgy and just decided he was ready to leave. EP team discussed importance of compliance with medication and follow up. He has converted to SR after discharge.   Patient returns for follow up for atrial fibrillation and atrial flutter. He is s/p DCCV on 07/12/23. Unfortunately, he is back in atrial flutter today. He states that he did not feel any improvement in his symptoms while in SR. No bleeding issues on anticoagulation.   Today, he  denies symptoms of palpitations, chest pain, shortness of breath, orthopnea, PND, lower extremity edema, dizziness, presyncope, syncope, snoring, daytime somnolence, bleeding, or neurologic sequela. The patient is tolerating medications without difficulties and is otherwise without complaint today.    Atrial Fibrillation Risk Factors:  he does not have symptoms or diagnosis of sleep apnea. he does not have a history of rheumatic fever. he does have a history of alcohol use. The patient does not have a history of early familial atrial fibrillation or other arrhythmias.   Atrial Fibrillation Management history:  Previous antiarrhythmic drugs: dofetilide   Previous cardioversions: 08/25/17, 01/20/21, 07/12/23 Previous ablations: none Anticoagulation history: Xarelto     Past  Medical History:  Diagnosis Date   Arthritis    Bladder cancer (HCC)    Chronic back pain    Chronic combined systolic and diastolic CHF (congestive heart failure) (HCC) 07/16/2021   COPD  (chronic obstructive pulmonary disease) (HCC)    COPD with chronic bronchitis and emphysema (HCC)    DDD (degenerative disc disease)    Diabetic retinopathy of both eyes (HCC)    Essential hypertension    GERD (gastroesophageal reflux disease)    History of atrial flutter 02/07/2011   Converted to NSR with Cardizem    History of chronic bronchitis    History of hemolytic anemia 02/07/2011   secondary to Avelox    HOH (hard of hearing)    HOH (hard of hearing)    no eardrum and nerve damage on R, also HOH on L   Mild renal insufficiency 08/25/2017   Mitral valve prolapse    a. 2D Echo 11/27/14: EF 55-60%; images were inadequate for LV wall motion assessment, + mild late systolic mitral valve prolapse involving the anterior leaflet.   PAD (peripheral artery disease) (HCC) 04/09/2014   Dr Charlotte Cookey; bilateral SFA occlusion, R mid, L distal   PAF (paroxysmal atrial fibrillation) (HCC)    a. Dx 11/2014 during admission for perf ulcer.   Perforated ulcer (HCC)    a. 11/2014 s/p surgery.   Productive cough    Smokers' cough (HCC)    Type 1 diabetes mellitus (HCC) 03/10/1975    Current Outpatient Medications  Medication Sig Dispense Refill   cefUROXime  (CEFTIN ) 250 MG tablet Take 1 tablet (250 mg total) by mouth 2 (two) times daily with a meal. Take for the first week of rotating months (rotate with doxycycline ) 14 tablet 5   cetirizine (ZYRTEC) 10 MG tablet Take 10 mg by mouth daily.     dofetilide  (TIKOSYN ) 250 MCG capsule TAKE 1 CAPSULE BY MOUTH TWICE A DAY 180 capsule 1   doxycycline  (VIBRA -TABS) 100 MG tablet Take 1 tablet (100 mg total) by mouth 2 (two) times daily. Take for the first week of alternating months (alternating with cefuroxime ) 14 tablet 5   empagliflozin  (JARDIANCE ) 10 MG TABS tablet Take 1 tablet (10 mg total) by mouth daily before breakfast.     Fluticasone -Umeclidin-Vilant (TRELEGY ELLIPTA ) 100-62.5-25 MCG/ACT AEPB INHALE 1 PUFF INTO THE LUNGS daily 180 each 3   furosemide   (LASIX ) 40 MG tablet TAKE 1 TABLET BY MOUTH DAILY AS NEEDED (WEIGHT GAIN OF 3 LBS OVERNIGHT OR 5 LBS IN A WEEK). (Patient taking differently: Take 20-40 mg by mouth See admin instructions. 20 mg Sun Tues Thurs Sat, 40 mg Mon Wed Fri) 90 tablet 1   HYDROcodone -acetaminophen  (NORCO/VICODIN) 5-325 MG tablet Take 1 tablet by mouth every 6 (six) hours as needed for moderate pain (pain score 4-6) or severe pain (pain score 7-10). 120 tablet 0   insulin  aspart (NOVOLOG ) 100 UNIT/ML injection PER SLIDING SCALE: 190 - 200 = 2 UNITS. 300 AND ABOVE = 7 UNITS. (Patient taking differently: Inject 2-9 Units into the skin 3 (three) times daily as needed for high blood sugar. PER SLIDING SCALE: 190 - 200 = 2 UNITS. 300 AND ABOVE = 7 UNITS.) 10 mL 1   insulin  glargine (LANTUS ) 100 UNIT/ML injection INJECT 0.4 MLS (40 UNITS TOTAL) INTO THE SKIN IN THE MORNING. (Patient taking differently: INJECT 0.4 MLS (40 UNITS TOTAL) INTO THE SKIN IN THE MORNING. He is currently taking 30 units daily per the patient) 30 mL 0   Insulin  Syringe-Needle U-100 (  B-D INS SYR ULTRAFINE 1CC/30G) 30G X 1/2" 1 ML MISC Use 4 times a day with insulin  Dx E11.9 400 each 3   Insulin  Syringes, Disposable, U-100 1 ML MISC Use 4 times a day for insulin  injection Dx E11.9 400 each 5   ipratropium-albuterol  (DUONEB) 0.5-2.5 (3) MG/3ML SOLN INHALE CONTENTS OF 1 VIAL VIA NEBULIZER EVERY 4 HOURS AS NEEDED (Patient taking differently: Inhale 3 mLs into the lungs every 4 (four) hours as needed. Uses on a daily basis at most every 4 to 6 hours) 360 mL 5   levalbuterol  (XOPENEX ) 0.31 MG/3ML nebulizer solution INHALE 3 MLS (0.31 MG TOTAL) BY NEBULIZATION EVERY 4 (FOUR) HOURS AS NEEDED FOR WHEEZING. (Patient taking differently: Take 1 ampule by nebulization 2 (two) times daily.) 1620 mL 1   losartan  (COZAAR ) 50 MG tablet Take 1 tablet (50 mg total) by mouth daily. 90 tablet 1   metoprolol  succinate (TOPROL -XL) 50 MG 24 hr tablet Take 1.5 tablets (75 mg total) by  mouth 2 (two) times daily. Take with or immediately following a meal. 270 tablet 2   naloxone  (NARCAN ) nasal spray 4 mg/0.1 mL Place 1 spray into the nose as needed (For opiate overdose). 1 each 2   omeprazole  (PRILOSEC) 40 MG capsule Take 1 capsule (40 mg total) by mouth daily. 30 capsule 11   OVER THE COUNTER MEDICATION Take 1-2 tablets by mouth See admin instructions. Super Beets gummies- Chew 1-2 gummies by mouth every day     OXYGEN  Inhale 3 L/min into the lungs continuous.     rivaroxaban  (XARELTO ) 20 MG TABS tablet TAKE 1 TABLET BY MOUTH DAILY  WITH SUPPER 100 tablet 1   potassium chloride  (KLOR-CON ) 10 MEQ tablet Take 1 tablet (10 mEq total) by mouth daily. (Patient not taking: Reported on 07/29/2023) 90 tablet 3   No current facility-administered medications for this encounter.    ROS- All systems are reviewed and negative except as per the HPI above.  Physical Exam: Vitals:   07/29/23 1538  BP: (!) 104/58  Pulse: (!) 109  Weight: 81.4 kg  Height: 5\' 8"  (1.727 m)    GEN: Well nourished, well developed in no acute distress CARDIAC: Irregularly irregular rate and rhythm, no murmurs, rubs, gallops RESPIRATORY:  Clear to auscultation without rales, wheezing or rhonchi, diminished breath sounds, O2 nasal canula   ABDOMEN: Soft, non-tender, non-distended EXTREMITIES:  No edema; No deformity    Wt Readings from Last 3 Encounters:  07/29/23 81.4 kg  06/30/23 81.4 kg  06/16/23 83.4 kg    EKG today demonstrates  Atrial flutter with variable block Vent. rate 109 BPM PR interval 192 ms QRS duration 78 ms QT/QTcB 380/511 ms   Echo 05/21/21 demonstrated   1. Left ventricular ejection fraction, by estimation, is 55 to 60%. The  left ventricle has normal function. The left ventricle has no regional  wall motion abnormalities. Left ventricular diastolic parameters are  consistent with Grade II diastolic dysfunction (pseudonormalization). Elevated left ventricular end-diastolic  pressure.   2. Right ventricular systolic function is normal. The right ventricular  size is normal. There is normal pulmonary artery systolic pressure.   3. The mitral valve is normal in structure. No evidence of mitral valve  regurgitation. No evidence of mitral stenosis.   4. The aortic valve is tricuspid. Aortic valve regurgitation is not  visualized. No aortic stenosis is present.   5. The inferior vena cava is normal in size with greater than 50%  respiratory variability, suggesting right  atrial pressure of 3 mmHg.   Epic records are reviewed at length today   CHA2DS2-VASc Score = 5  The patient's score is based upon: CHF History: 1 HTN History: 1 Diabetes History: 1 Stroke History: 0 Vascular Disease History: 1 Age Score: 1 Gender Score: 0       ASSESSMENT AND PLAN: Persistent Atrial Fibrillation/atrial flutter The patient's CHA2DS2-VASc score is 5, indicating a 7.2% annual risk of stroke.   S/p DCCV 07/12/23 with quick return of atrial flutter. He did not feel better in SR.  Likely failing dofetilide . He is not a candidate for ablation. Would also not use amiodarone given his baseline severe lung disease. D/w primary EP, will discontinue dofetilide  and pursue rate control only.  Increase Toprol  to 100 mg BID Continue Xarelto  20 mg daily  Secondary Hypercoagulable State (ICD10:  D68.69) The patient is at significant risk for stroke/thromboembolism based upon his CHA2DS2-VASc Score of 5.  Continue Rivaroxaban  (Xarelto ).   HFrecEF EF 55-60% GDMT per primary cardiology team Fluid status appears stable today  HTN Stable on current regimen  CAD Three vessel disease noted on CT, PET stress test showed small area of ischemia, felt to be low risk. No anginal symptoms Followed by Dr Alda Amas   Follow up with Dr Alda Amas or APP in 3 months. AF clinic as needed.     Myrtha Ates PA-C [facility] [address] [place], [place]  [address] [phone] 07/29/2023 4:02 PM

## 2023-07-29 NOTE — Patient Instructions (Signed)
 STOP dofetilide   INCREASE metoprolol  100mg  twice a day   Follow up 3 months with Dr Alda Amas

## 2023-08-03 ENCOUNTER — Telehealth: Payer: Self-pay | Admitting: Gastroenterology

## 2023-08-03 NOTE — Telephone Encounter (Signed)
 Spoke with pt and pts wife. He stopped taking the omeprazole  on Friday. His stomach started hurting and he took pepto and was then constipated for several days. Then pt started having diarrhea and thought he was bleeding because his stools were black. Discussed with pts wife that he should resume the omeprazole , then his stomach should feel better. Discussed adding miralax  to help with his constipation 1-3 doses daily to have BM. They will try this and call back if still having issues.

## 2023-08-03 NOTE — Telephone Encounter (Signed)
 Patient called and stated that he was prescribed Omeprazole  but has not had any type of relief since taking this medication. Patient is requesting a change in medication as well as a call back. Please advise.

## 2023-08-03 NOTE — Telephone Encounter (Signed)
 Pts wife states that the pt has been taking the omeprazole  every am prior to eating anything but has not noticed any improvement. Pt is requesting an alternate medication be sent in for him. Prior to the omeprazole  40mg  daily the pt was taking protonix  40mg  daily. States neither medication helped his symptoms/gerd. Requesting a different medication, reports the omeprazole  and pantoprazole  are not helping. Please advise.

## 2023-08-03 NOTE — Telephone Encounter (Signed)
 He had mild esophagitis on endoscopy. 40 mg of PPI should be adequate for reflux. Please specify what symptoms, specifically, that are not being addressed with PPI. Thanks, Dr. Elvin Hammer

## 2023-08-08 ENCOUNTER — Other Ambulatory Visit: Payer: Self-pay | Admitting: Nurse Practitioner

## 2023-08-08 DIAGNOSIS — E10649 Type 1 diabetes mellitus with hypoglycemia without coma: Secondary | ICD-10-CM

## 2023-08-10 NOTE — Progress Notes (Signed)
 Subjective:    Patient ID: Thomas Rack., male    DOB: 11-01-1955, 68 y.o.   MRN: [id number]  HPI  Thomas Benzel. is a 68 y.o. year old male  who  has a past medical history of Arthritis, Bladder cancer (HCC), Chronic back pain, Chronic combined systolic and diastolic CHF (congestive heart failure) (HCC) (07/16/2021), COPD (chronic obstructive pulmonary disease) (HCC), COPD with chronic bronchitis and emphysema (HCC), DDD (degenerative disc disease), Diabetic retinopathy of both eyes (HCC), Essential hypertension, GERD (gastroesophageal reflux disease), History of atrial flutter (02/07/2011), History of chronic bronchitis, History of hemolytic anemia (02/07/2011), HOH (hard of hearing), HOH (hard of hearing), Mild renal insufficiency (08/25/2017), Mitral valve prolapse, PAD (peripheral artery disease) (HCC) (04/09/2014), PAF (paroxysmal atrial fibrillation) (HCC), Perforated ulcer (HCC), Productive cough, Smokers' cough (HCC), and Type 1 diabetes mellitus (HCC) (03/10/1975).   They are presenting to PM&R clinic for follow up related to chronic low back pain .   Interval Hx:  - Therapies: He gets out to ride around on the gator, but the smoke effects him and he can't tolerate it long. Otherwise, not very mobile.    - Follow ups: failed cardioversion for a flutter 07/12/23; not pursuing rate control only. Took him off Tikosyn .    - Falls: none recently   - DME: Is now wearing his [place] in his mouth because he says it does better with his oxygen  levels.    - Medications: Norco 5 mg QID last filled 07/12/23 - he is out today. He feels it works ok.   - Other concerns: He is STILL having more pain since his ESI with Dr. Sharl Davies. No headaches, no nausea, no localized rash or lesions.  Had a discussion on goals of care - patient has multiple medical comorbidities - he still wishes to pursue full care and go to the hospital.    Pain Inventory Average Pain 8 Pain Right Now 8 My pain is  constant, sharp, stabbing, and aching  In the last 24 hours, has pain interfered with the following? General activity 7 Relation with others 0 Enjoyment of life 0 What TIME of day is your pain at its worst? morning , daytime, evening, night, and varies Sleep (in general) Fair  Pain is worse with: walking, bending, standing, and some activites Pain improves with: rest and medication Relief from Meds: 5  Family History  Problem Relation Age of Onset   Breast cancer Mother    Cancer Mother        Breast   Rheumatic fever Father    Heart disease Father    Heart attack Father        Massive    Diabetes Son    Social History   Socioeconomic History   Marital status: Married    Spouse name: Not on file   Number of children: Not on file   Years of education: Not on file   Highest education level: Not on file  Occupational History   Occupation: Tobacco Farmer  Tobacco Use   Smoking status: Every Day    Current packs/day: 0.50    Average packs/day: 0.5 packs/day for 30.0 years (15.0 ttl pk-yrs)    Types: Cigarettes   Smokeless tobacco: Former    Quit date: 06/08/1978   Tobacco comments:    1/2 pk 03/23/23 NM, CMA   Vaping Use   Vaping status: Never Used  Substance and Sexual Activity   Alcohol use: Not Currently    Comment:  5 quarts per week 12/26/21   Drug use: No   Sexual activity: Not on file  Other Topics Concern   Not on file  Social History Narrative   Lives in [place] with wife and 2 sons.    Social Drivers of Health   Financial Resource Strain: Low Risk  (04/12/2023)   Overall Financial Resource Strain (CARDIA)    Difficulty of Paying Living Expenses: Not very hard  Food Insecurity: Low Risk  (11/24/2022)   Received from [facility]   Hunger Vital Sign    Worried About Running Out of Food in the Last Year: Never true    Ran Out of Food in the Last Year: Never true  Transportation Needs: No Transportation Needs (11/24/2022)   Received from [employer]    In the past 12 months, has lack of reliable transportation kept you from medical appointments, meetings, work or from getting things needed for daily living? : No  Physical Activity: Inactive (04/12/2023)   Exercise Vital Sign    Days of Exercise per Week: 0 days    Minutes of Exercise per Session: 0 min  Stress: No Stress Concern Present (04/12/2023)   [employer] of Occupational Health - Occupational Stress Questionnaire    Feeling of Stress : Not at all  Social Connections: Socially Isolated (04/12/2023)   Social Connection and Isolation Panel [NHANES]    Frequency of Communication with Friends and Family: Once a week    Frequency of Social Gatherings with Friends and Family: Never    Attends Religious Services: Never    Database administrator or Organizations: No    Attends Engineer, structural: Never    Marital Status: Married   Past Surgical History:  Procedure Laterality Date   BIOPSY OF SKIN SUBCUTANEOUS TISSUE AND/OR MUCOUS MEMBRANE  06/16/2023   Procedure: BIOPSY, SKIN, SUBCUTANEOUS TISSUE, OR MUCOUS MEMBRANE;  Surgeon: Tobin Forts, MD;  Location: [facility];  Service: Gastroenterology;;   CARDIOVERSION N/A 01/20/2021   Procedure: CARDIOVERSION;  Surgeon: Elmyra Haggard, MD;  Location: [facility] ENDOSCOPY;  Service: Cardiovascular;  Laterality: N/A;   CARDIOVERSION N/A 07/12/2023   Procedure: CARDIOVERSION;  Surgeon: [name], MD;  Location: [facility];  Service: Cardiovascular;  Laterality: N/A;   COLONOSCOPY WITH PROPOFOL  N/A 06/16/2023   Procedure: COLONOSCOPY WITH PROPOFOL ;  Surgeon: Tobin Forts, MD;  Location: [facility]COPY;  Service: Gastroenterology;  Laterality: N/A;   CYSTOSCOPY WITH URETEROSCOPY Right 08/14/2013   Procedure: CYSTOSCOPY WITH URETEROSCOPY BLADDER BIOPSY ;  Surgeon: Alanson Alliance, MD;  Location: [facility];  Service: Urology;  Laterality: Right;   ESOPHAGOGASTRODUODENOSCOPY N/A 02/06/2013   Procedure:  ESOPHAGOGASTRODUODENOSCOPY (EGD);  Surgeon: Pietro Bridegroom, MD;  Location: [facility] ENDOSCOPY;  Service: Endoscopy;  Laterality: N/A;   ESOPHAGOGASTRODUODENOSCOPY (EGD) WITH PROPOFOL  N/A 06/16/2023   Procedure: ESOPHAGOGASTRODUODENOSCOPY (EGD) WITH PROPOFOL ;  Surgeon: Tobin Forts, MD;  Location: [facility] ENDOS[facility];  Service: Gastroenterology;  Laterality: N/A;   LAPAROSCOPY N/A 11/25/2014   Procedure: LAPAROSCOPIC PRIMARY REPAIR OF PERFORATED PREPYLORIC ULCER WITH Olevia Bers;  Surgeon: Aldean Hummingbird, MD;  Location[facility];  Service: General;  Laterality: N/A;   TONSILLECTOMY  as child   TRANSTHORACIC ECHOCARDIOGRAM  02-17-2011   MODERATE LVH/  EF 65%   TRANSURETHRAL RESECTION OF BLADDER TUMOR WITH GYRUS (TURBT-GYRUS) N/A 06/12/2013   Procedure: TRANSURETHRAL RESECTION OF BLADDER TUMOR WITH GYRUS (TURBT-GYRUS);  Surgeon: Mark C Ottelin, MD;  Location: [facility];  Service: Urology;  Laterality: N/A;   TYMPANIC MEMBRANE REPAIR  as child   Past Surgical History:  Procedure Laterality Date   BIOPSY OF SKIN SUBCUTANEOUS TISSUE AND/OR MUCOUS MEMBRANE  06/16/2023   Procedure: BIOPSY, SKIN, SUBCUTANEOUS TISSUE, OR MUCOUS MEMBRANE;  Surgeon: Tobin Forts, MD;  Location: [facility];  Service: Gastroenterology;;   CARDIOVERSION N/A 01/20/2021   Procedure: CARDIOVERSION;  Surgeon: Elmyra Haggard, MD;  Location: [facility] ENDOSCOPY;  Service: Cardiovascular;  Laterality: N/A;   CARDIOVERSION N/A 07/12/2023   Procedure: CARDIOVERSION;  Surgeon: [name], MD;  Location: [facility];  Service: Cardiovascular;  Laterality: N/A;   COLONOSCOPY WITH PROPOFOL  N/A 06/16/2023   Procedure: COLONOSCOPY WITH PROPOFOL ;  Surgeon: Tobin Forts, MD;  Location: [facility];  Service: Gastroenterology;  Laterality: N/A;   CYSTOSCOPY WITH URETEROSCOPY Right 08/14/2013   Procedure: CYSTOSCOPY WITH URETEROSCOPY BLADDER BIOPSY ;  Surgeon: Alanson Alliance, MD;  Location: [facility];  Service: Urology;   Laterality: Right;   ESOPHAGOGASTRODUODENOSCOPY N/A 02/06/2013   Procedure: ESOPHAGOGASTRODUODENOSCOPY (EGD);  Surgeon: Pietro Bridegroom, MD;  Location: [facility] ENDOSCOPY;  Service: Endoscopy;  Laterality: N/A;   ESOPHAGOGASTRODUODENOSCOPY (EGD) WITH PROPOFOL  N/A 06/16/2023   Procedure: ESOPHAGOGASTRODUODENOSCOPY (EGD) WITH PROPOFOL ;  Surgeon: Tobin Forts, MD;  Location: [facility];  Service: Gastroenterology;  Laterality: N/A;   LAPAROSCOPY N/A 11/25/2014   Procedure: LAPAROSCOPIC PRIMARY REPAIR OF PERFORATED PREPYLORIC ULCER WITH Olevia Bers;  Surgeon: Aldean Hummingbird, MD;  Location: [facility] OR;  Service: General;  Laterality: N/A;   TONSILLECTOMY  as child   TRANSTHORACIC ECHOCARDIOGRAM  02-17-2011   MODERATE LVH/  EF 65%   TRANSURETHRAL RESECTION OF BLADDER TUMOR WITH GYRUS (TURBT-GYRUS) N/A 06/12/2013   Procedure: TRANSURETHRAL RESECTION OF BLADDER TUMOR WITH GYRUS (TURBT-GYRUS);  Surgeon: Mark C Ottelin, MD;  Location: [facility];  Service: Urology;  Laterality: N/A;   TYMPANIC MEMBRANE REPAIR  as child   Past Medical History:  Diagnosis Date   Arthritis    Bladder cancer (HCC)    Chronic back pain    Chronic combined systolic and diastolic CHF (congestive heart failure) (HCC) 07/16/2021   COPD (chronic obstructive pulmonary disease) (HCC)    COPD with chronic bronchitis and emphysema (HCC)    DDD (degenerative disc disease)    Diabetic retinopathy of both eyes (HCC)    Essential hypertension    GERD (gastroesophageal reflux disease)    History of atrial flutter 02/07/2011   Converted to NSR with Cardizem    History of chronic bronchitis    History of hemolytic anemia 02/07/2011   secondary to Avelox    HOH (hard of hearing)    HOH (hard of hearing)    no eardrum and nerve damage on R, also HOH on L   Mild renal insufficiency 08/25/2017   Mitral valve prolapse    a. 2D Echo 11/27/14: EF 55-60%; images were inadequate for LV wall motion assessment, + mild late systolic mitral  valve prolapse involving the anterior leaflet.   PAD (peripheral artery disease) (HCC) 04/09/2014   Dr Charlotte Cookey; bilateral SFA occlusion, R mid, L distal   PAF (paroxysmal atrial fibrillation) (HCC)    a. Dx 11/2014 during admission for perf ulcer.   Perforated ulcer (HCC)    a. 11/2014 s/p surgery.   Productive cough    Smokers' cough (HCC)    Type 1 diabetes mellitus (HCC) 03/10/1975   BP (!) 78/63 (BP Location: Left Arm, Patient Position: Sitting)   Pulse 90  Ht 5\' 8"  (1.727 m)   Wt 176 lb 9.6 oz (80.1 kg)   SpO2 98%   BMI 26.85 kg/m   Opioid Risk Score:   Fall Risk Score:  `1  Depression screen [facility] 2/9     08/11/2023    2:53 PM 05/12/2023    2:59 PM 02/03/2023    2:22 PM 02/02/2023    3:07 PM 01/12/2023    1:41 PM 12/28/2022    3:46 PM 11/02/2022    2:33 PM  Depression screen PHQ 2/9  Decreased Interest 0 0 0 0 0 0 0  Down, Depressed, Hopeless 0 0 0 0 0 0 0  PHQ - 2 Score 0 0 0 0 0 0 0  Altered sleeping    0  0   Tired, decreased energy    0  0   Change in appetite    0  0   Feeling bad or failure about yourself     0  0   Trouble concentrating    0  0   Moving slowly or fidgety/restless    0  0   Suicidal thoughts    0  0   PHQ-9 Score    0  0   Difficult doing work/chores    Not difficult at all  Not difficult at all      Review of Systems  All other systems reviewed and are negative.      Objective:   Physical Exam   PE: Constitution: Appropriate appearance for age. No apparent distress. Resp: No respiratory distress. No accessory muscle usage. on 2 L [place] Cardio: Well perfused appearance. No peripheral edema. Abdomen: Nondistended. Nontender.   Psych: Appropriate mood and affect. Neuro: AAOx4. No apparent cognitive deficits. +HOH  Neurologic Exam:   DTRs: Reflexes were 2+ in bilateral achilles, patella, biceps, BR and triceps. Babinsky: flexor responses b/l.   Hoffmans: negative b/l Sensory exam: revealed normal sensation in all dermatomal regions in  bilateral lower extremities Motor exam: strength 4+/5 throughout bilateral lower extremities Coordination: Fine motor coordination was normal.   Gait: +antalgic gait; poor activity tolerance.    MSK: + Exquisite TTP bilateral low back lumbar paraspinals, quadratus + Pain with facet loading - slump  B/l medial knee pain, without crepitus or effusion palpable. Negative anterior and posterior drawer, lateral and medial stress tests.     Assessment & Plan:  Thomas Dion. is a 68 y.o. year old male  who  has a past medical history of Arthritis, Bladder cancer (HCC), Chronic back pain, Chronic combined systolic and diastolic CHF (congestive heart failure) (HCC) (07/16/2021), COPD (chronic obstructive pulmonary disease) (HCC), COPD with chronic bronchitis and emphysema (HCC), DDD (degenerative disc disease), Diabetic retinopathy of both eyes (HCC), Essential hypertension, GERD (gastroesophageal reflux disease), History of atrial flutter (02/07/2011), History of chronic bronchitis, History of hemolytic anemia (02/07/2011), HOH (hard of hearing), HOH (hard of hearing), Mild renal insufficiency (08/25/2017), Mitral valve prolapse, PAD (peripheral artery disease) (HCC) (04/09/2014), PAF (paroxysmal atrial fibrillation) (HCC), Perforated ulcer (HCC), Productive cough, Smokers' cough (HCC), and Type 1 diabetes mellitus (HCC) (03/10/1975).   They are presenting to PM&R clinic as a follow up fpr chronic pain management for bilateral low back pain without sciatica.   Chronic pain syndrome Encounter for long-term opiate analgesic use Encounter for therapeutic drug monitoring  Norco 5-325 mg TID PRN  --> increased to 4 times daily 11-27; patient requesting increase, discussed why this is not appropriate given the risk of  respiratory compromise  Patient has failed Butrans  patch, elavil, tramadol, lidocaine  patches, NSAIDs, tylenol   Follow up in 3 months for chronic pain management  Chronic bilateral low  back pain without sciatica Mobility impaired  Patient Declines knee xrays or bracing  Declines further back injections/medial branch blocks due to increased pain post-procedure  Declines surgical referral --had an extensive discussion regarding this, patient feels it is not "bad enough" to warrant surgery despite very limited mobilityt and endorsed poor quality of life  Decline palliative care referral for GOC discussion given multiple medical co-morbidities, repeated hospitalizations, repeated requests to increase pain medication and refusal of interventional/surgical options.   Other orders -     HYDROcodone -Acetaminophen ; Take 1 tablet by mouth every 6 (six) hours as needed for moderate pain (pain score 4-6) or severe pain (pain score 7-10).  Dispense: 120 tablet; Refill: 0 -     HYDROcodone -Acetaminophen ; Take 1 tablet by mouth every 6 (six) hours as needed for moderate pain (pain score 4-6) or severe pain (pain score 7-10).  Dispense: 120 tablet; Refill: 0 -     HYDROcodone -Acetaminophen ; Take 1 tablet by mouth every 6 (six) hours as needed for moderate pain (pain score 4-6) or severe pain (pain score 7-10).  Dispense: 120 tablet; Refill: 0

## 2023-08-11 ENCOUNTER — Telehealth: Payer: Self-pay

## 2023-08-11 ENCOUNTER — Other Ambulatory Visit: Payer: Self-pay | Admitting: Physical Medicine and Rehabilitation

## 2023-08-11 ENCOUNTER — Encounter: Payer: Self-pay | Admitting: Physical Medicine and Rehabilitation

## 2023-08-11 ENCOUNTER — Encounter: Attending: Registered Nurse | Admitting: Physical Medicine and Rehabilitation

## 2023-08-11 ENCOUNTER — Other Ambulatory Visit (HOSPITAL_COMMUNITY): Payer: Self-pay

## 2023-08-11 VITALS — BP 78/63 | HR 90 | Ht 68.0 in | Wt 176.6 lb

## 2023-08-11 DIAGNOSIS — Z7409 Other reduced mobility: Secondary | ICD-10-CM | POA: Insufficient documentation

## 2023-08-11 DIAGNOSIS — Z5181 Encounter for therapeutic drug level monitoring: Secondary | ICD-10-CM | POA: Insufficient documentation

## 2023-08-11 DIAGNOSIS — G894 Chronic pain syndrome: Secondary | ICD-10-CM | POA: Diagnosis not present

## 2023-08-11 DIAGNOSIS — M545 Low back pain, unspecified: Secondary | ICD-10-CM | POA: Diagnosis not present

## 2023-08-11 DIAGNOSIS — G8929 Other chronic pain: Secondary | ICD-10-CM | POA: Insufficient documentation

## 2023-08-11 DIAGNOSIS — Z79891 Long term (current) use of opiate analgesic: Secondary | ICD-10-CM | POA: Diagnosis not present

## 2023-08-11 MED ORDER — HYDROCODONE-ACETAMINOPHEN 5-325 MG PO TABS
1.0000 | ORAL_TABLET | Freq: Four times a day (QID) | ORAL | 0 refills | Status: DC | PRN
  Filled 2023-08-11: qty 120, 30d supply, fill #0

## 2023-08-11 MED ORDER — HYDROCODONE-ACETAMINOPHEN 5-325 MG PO TABS
1.0000 | ORAL_TABLET | Freq: Four times a day (QID) | ORAL | 0 refills | Status: AC | PRN
  Filled 2023-08-11 – 2023-09-11 (×2): qty 120, 30d supply, fill #0

## 2023-08-11 MED ORDER — HYDROCODONE-ACETAMINOPHEN 5-325 MG PO TABS
1.0000 | ORAL_TABLET | Freq: Four times a day (QID) | ORAL | 0 refills | Status: AC | PRN
  Filled 2023-10-09: qty 120, 30d supply, fill #0

## 2023-08-11 MED ORDER — HYDROCODONE-ACETAMINOPHEN 5-325 MG PO TABS: 1.0000 | ORAL_TABLET | Freq: Four times a day (QID) | ORAL | 0 refills | Status: DC | PRN

## 2023-08-11 MED ORDER — HYDROCODONE-ACETAMINOPHEN 5-325 MG PO TABS
1.0000 | ORAL_TABLET | Freq: Four times a day (QID) | ORAL | 0 refills | Status: DC | PRN
  Filled 2023-11-09: qty 120, 30d supply, fill #0

## 2023-08-11 NOTE — Telephone Encounter (Signed)
 [facility] called and stated that they need a diagnosis code in order to fill Thomas Sandoval's hydrocodone .  She stated that G89.4 code is not enough they need additional codes.

## 2023-08-11 NOTE — Telephone Encounter (Signed)
 OK I've re-sent the scripts with a free text indication. I havent had this issue before so hopefully that fixes it. Thanks!

## 2023-08-12 ENCOUNTER — Other Ambulatory Visit (HOSPITAL_COMMUNITY): Payer: Self-pay

## 2023-08-14 ENCOUNTER — Other Ambulatory Visit: Payer: Self-pay | Admitting: Nurse Practitioner

## 2023-08-14 DIAGNOSIS — K2100 Gastro-esophageal reflux disease with esophagitis, without bleeding: Secondary | ICD-10-CM

## 2023-08-17 ENCOUNTER — Other Ambulatory Visit (HOSPITAL_COMMUNITY): Payer: Self-pay | Admitting: Physician Assistant

## 2023-08-19 IMAGING — DX DG CHEST 1V PORT
1 series · 1 of 1 positions shown · non-contrast
Comparison: 07/16/2021

CLINICAL DATA: Cough

EXAM:
PORTABLE CHEST - 1 VIEW

[chest ap]
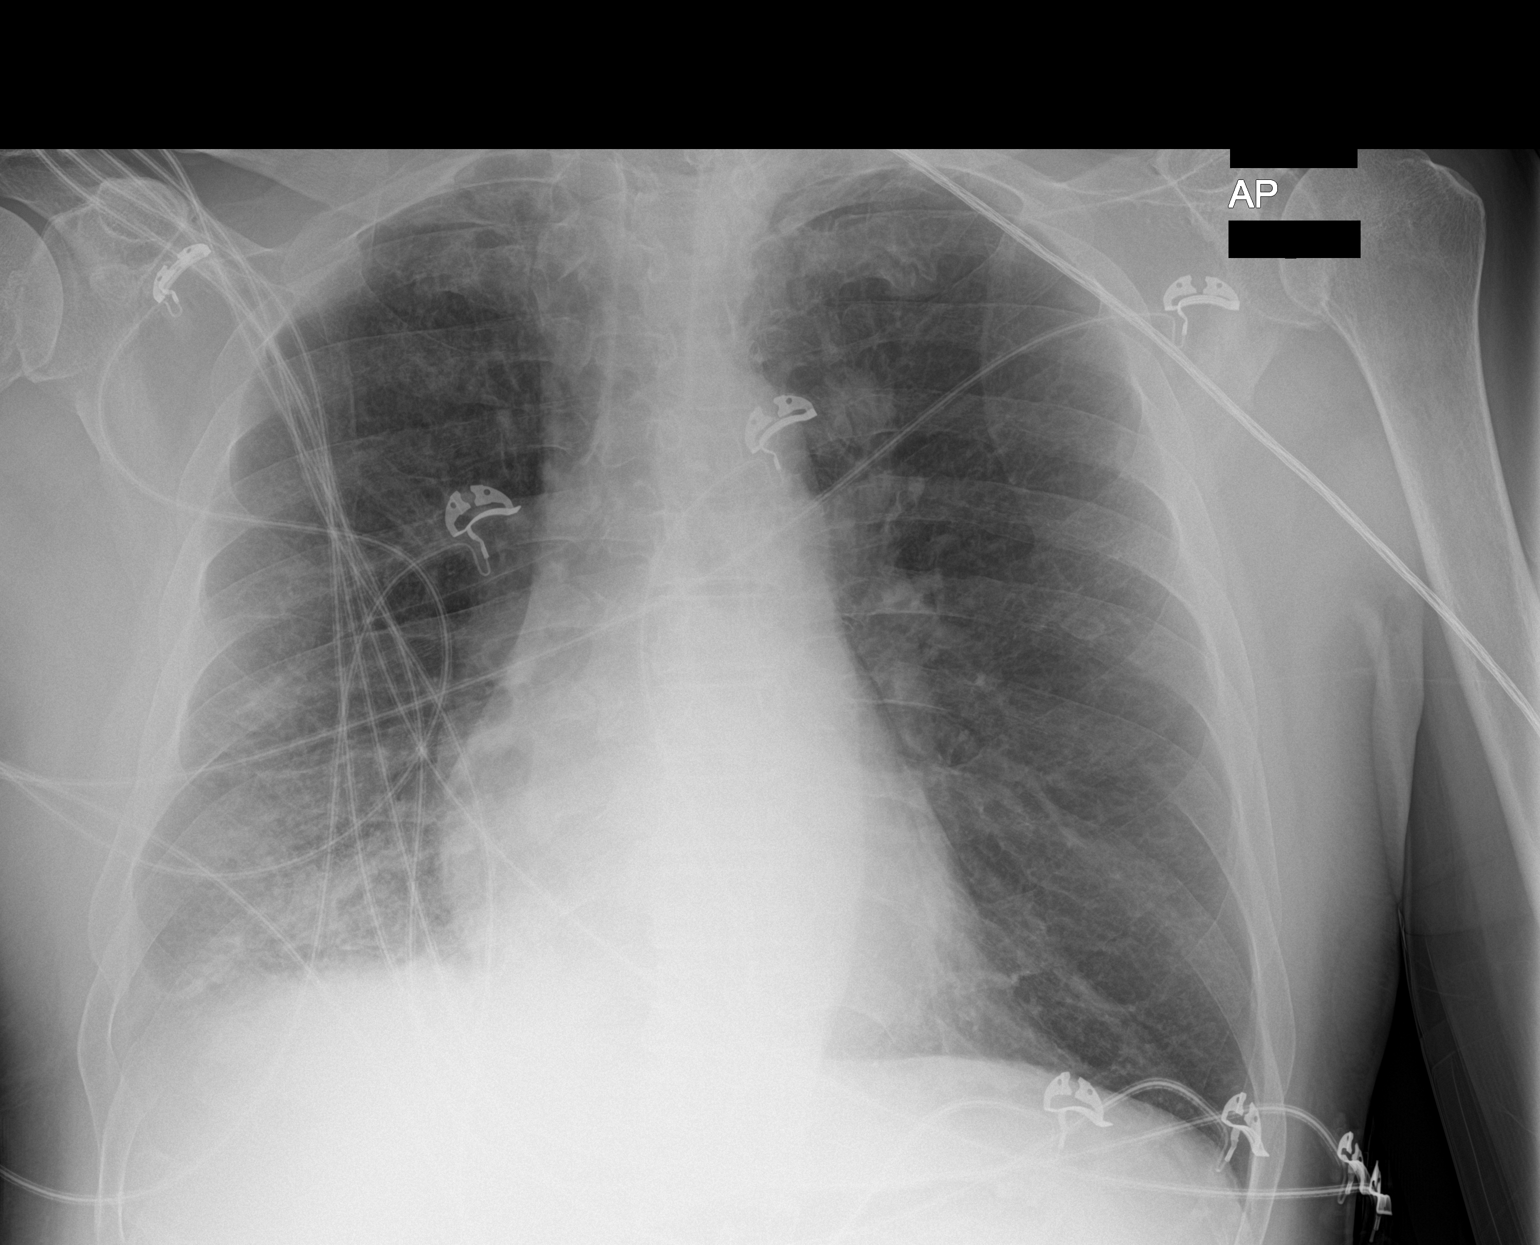

[1 of 1 positions shown; findings below may reference images not displayed]

FINDINGS: Cardiomediastinal silhouette and pulmonary vasculature are within
normal limits.

Significant interval improvement in aeration of the right lung. Left
lung remains well aerated.
IMPRESSION: Significant interval improvement in aeration of the right lung
likely due to resolving pneumonia.

## 2023-08-20 DIAGNOSIS — J9601 Acute respiratory failure with hypoxia: Secondary | ICD-10-CM | POA: Diagnosis not present

## 2023-08-31 ENCOUNTER — Other Ambulatory Visit: Payer: Self-pay | Admitting: Nurse Practitioner

## 2023-08-31 DIAGNOSIS — E10649 Type 1 diabetes mellitus with hypoglycemia without coma: Secondary | ICD-10-CM

## 2023-08-31 DIAGNOSIS — I10 Essential (primary) hypertension: Secondary | ICD-10-CM

## 2023-09-11 ENCOUNTER — Other Ambulatory Visit (HOSPITAL_COMMUNITY): Payer: Self-pay

## 2023-09-14 ENCOUNTER — Ambulatory Visit: Payer: Medicare Other | Admitting: Nurse Practitioner

## 2023-09-15 ENCOUNTER — Encounter: Payer: Self-pay | Admitting: Nurse Practitioner

## 2023-09-19 DIAGNOSIS — J9601 Acute respiratory failure with hypoxia: Secondary | ICD-10-CM | POA: Diagnosis not present

## 2023-09-22 ENCOUNTER — Ambulatory Visit: Payer: Self-pay
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
## 2023-09-22 NOTE — Telephone Encounter (Signed)
 Called to offer patient a video visit with DOD, wife [name] declined video visit as she is not home and patient does not have a smart phone. Offered an appointment for tomorrow with MMM, wife declined stating that the patient can not come out in the mornings because it is hard for him to get around and that they would just come in in Friday for his normal appointment.

## 2023-09-24 ENCOUNTER — Ambulatory Visit: Admitting: Nurse Practitioner

## 2023-09-29 ENCOUNTER — Encounter: Payer: Self-pay | Admitting: Nurse Practitioner

## 2023-10-08 ENCOUNTER — Other Ambulatory Visit (HOSPITAL_COMMUNITY): Payer: Self-pay

## 2023-10-08 ENCOUNTER — Ambulatory Visit: Admitting: Nurse Practitioner

## 2023-10-08 ENCOUNTER — Telehealth: Payer: Self-pay | Admitting: Nurse Practitioner

## 2023-10-08 ENCOUNTER — Encounter: Payer: Self-pay | Admitting: Nurse Practitioner

## 2023-10-08 VITALS — BP 124/63 | HR 98 | Temp 96.9°F | Ht 68.0 in | Wt 176.0 lb

## 2023-10-08 DIAGNOSIS — E10649 Type 1 diabetes mellitus with hypoglycemia without coma: Secondary | ICD-10-CM | POA: Diagnosis not present

## 2023-10-08 DIAGNOSIS — I5042 Chronic combined systolic (congestive) and diastolic (congestive) heart failure: Secondary | ICD-10-CM | POA: Diagnosis not present

## 2023-10-08 DIAGNOSIS — I10 Essential (primary) hypertension: Secondary | ICD-10-CM | POA: Diagnosis not present

## 2023-10-08 DIAGNOSIS — I4819 Other persistent atrial fibrillation: Secondary | ICD-10-CM | POA: Diagnosis not present

## 2023-10-08 DIAGNOSIS — J449 Chronic obstructive pulmonary disease, unspecified: Secondary | ICD-10-CM | POA: Diagnosis not present

## 2023-10-08 LAB — BAYER DCA HB A1C WAIVED: HB A1C (BAYER DCA - WAIVED): 5.8 % — ABNORMAL HIGH (ref 4.8–5.6)

## 2023-10-08 LAB — LIPID PANEL

## 2023-10-08 MED ORDER — LOSARTAN POTASSIUM 50 MG PO TABS: 50.0000 mg | ORAL_TABLET | Freq: Every day | ORAL | 0 refills | Status: DC

## 2023-10-08 MED ORDER — EMPAGLIFLOZIN 10 MG PO TABS: 10.0000 mg | ORAL_TABLET | Freq: Every day | ORAL | 0 refills | Status: AC

## 2023-10-08 MED ORDER — LANCETS MISC. MISC: 1.0000 | Freq: Three times a day (TID) | 0 refills | Status: AC

## 2023-10-08 MED ORDER — BLOOD GLUCOSE MONITORING SUPPL DEVI: 1.0000 | Freq: Three times a day (TID) | 0 refills | Status: DC

## 2023-10-08 MED ORDER — BLOOD GLUCOSE TEST VI STRP: 1.0000 | ORAL_STRIP | Freq: Three times a day (TID) | 0 refills | Status: DC

## 2023-10-08 MED ORDER — INSULIN ASPART 100 UNIT/ML IJ SOLN: 2.0000 [IU] | Freq: Three times a day (TID) | INTRAMUSCULAR | 1 refills | Status: DC | PRN

## 2023-10-08 MED ORDER — POTASSIUM CHLORIDE ER 10 MEQ PO TBCR: 10.0000 meq | EXTENDED_RELEASE_TABLET | Freq: Every day | ORAL | 1 refills | Status: DC

## 2023-10-08 MED ORDER — TRELEGY ELLIPTA 100-62.5-25 MCG/ACT IN AEPB: INHALATION_SPRAY | RESPIRATORY_TRACT | 3 refills | Status: DC

## 2023-10-08 MED ORDER — INSULIN GLARGINE 100 UNIT/ML ~~LOC~~ SOLN: SUBCUTANEOUS | 0 refills | Status: DC

## 2023-10-08 MED ORDER — RIVAROXABAN 20 MG PO TABS: 20.0000 mg | ORAL_TABLET | Freq: Every day | ORAL | 1 refills | Status: DC

## 2023-10-08 MED ORDER — OMEPRAZOLE 40 MG PO CPDR: 40.0000 mg | DELAYED_RELEASE_CAPSULE | Freq: Every day | ORAL | 1 refills | Status: DC

## 2023-10-08 MED ORDER — FUROSEMIDE 40 MG PO TABS: 20.0000 mg | ORAL_TABLET | ORAL | 1 refills | Status: AC

## 2023-10-08 MED ORDER — LANCET DEVICE MISC: 1.0000 | Freq: Three times a day (TID) | 0 refills | Status: AC

## 2023-10-08 MED ORDER — METOPROLOL SUCCINATE ER 100 MG PO TB24: 100.0000 mg | ORAL_TABLET | Freq: Two times a day (BID) | ORAL | 2 refills | Status: AC

## 2023-10-08 NOTE — Addendum Note (Signed)
 Addended by: VIKTORIA ALAN MATSU on: 10/08/2023 02:54 PM   Modules accepted: Orders

## 2023-10-08 NOTE — Progress Notes (Signed)
 Subjective:    Patient ID: Thomas JONETTA Dollene Mickey., male    DOB: 1955/07/31, 68 y.o.   MRN: [id number]   Chief Complaint: medical management of chronic issues     HPI:  Thomas Mccuiston. is a 68 y.o. who identifies as a male who was assigned male at birth.   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 Essential hypertension No c/o chest pain or headaches- has chronic SOB. BP Readings from Last 3 Encounters:  08/11/23 (!) 78/63  07/29/23 (!) 104/58  07/12/23 117/70     2. Persistent atrial fibrillation (HCC) 3. Chronic combined systolic and diastolic CHF (congestive heart failure) (HCC) Last cardiology visit was done on 08/01/23. Review of office note showed that they stopped tikosin. He denies palpitations  4. COPD with chronic bronchitis and emphysema (HCC) Is on oxygen  now but insists on continuing to smoke. He does take oxygen  off now when smoking. Has had inry to face in past when trying to smoke while on oxygen . He gets pneumonia very easily and has been on antiobiotics almost monthly. He wears his O2 cannula in his mouth because he is a mouth breather.  5. Chronic respiratory failure with hypoxia (HCC) Stays SOB without his oxygen   6. Uncontrolled type 1 diabetes mellitus with hypoglycemia, with long-term current use of insulin  [facility]) He eats whatever his wife fixes him. He very seldom checks his blood sugars. Lab Results  Component Value Date   HGBA1C 5.7 (H) 03/16/2023    7. Alcohol-induced chronic pancreatitis (HCC) Says he is no longer drinking alcohol.  8. Mild renal insufficiency No  voiding issues Lab Results  Component Value Date   CREATININE 1.19 06/30/2023     9. Tobacco use Still smoking  10. BMI 26.0-26.9  Wt Readings from Last 3 Encounters:  10/08/23 176 lb (79.8 kg)  08/11/23 176 lb 9.6 oz (80.1 kg)  07/29/23  179 lb 6.4 oz (81.4 kg)   BMI Readings from Last 3 Encounters:  10/08/23 26.76 kg/m  08/11/23 26.85 kg/m  07/29/23 27.28 kg/m       New complaints: None today  Allergies  Allergen Reactions   Avelox  [Moxifloxacin  Hcl In Nacl] Other (See Comments)    Hemolysis  In 2012   Azithromycin  Other (See Comments) and Nausea And Vomiting    Severe stomach cramps; told to list as an allergy by dr. Orlinda ago   Bactrim [Sulfamethoxazole -Trimethoprim] Diarrhea and Nausea And Vomiting   Outpatient Encounter Medications as of 10/08/2023  Medication Sig   cefUROXime  (CEFTIN ) 250 MG tablet Take 1 tablet (250 mg total) by mouth 2 (two) times daily with a meal. Take for the first week of rotating months (rotate with doxycycline )   cetirizine (ZYRTEC) 10 MG tablet Take 10 mg by mouth daily.   doxycycline  (VIBRA -TABS) 100 MG tablet Take 1 tablet (100 mg total) by mouth 2 (two) times daily. Take for the first week of alternating months (alternating with cefuroxime )   Fluticasone -Umeclidin-Vilant (TRELEGY ELLIPTA ) 100-62.5-25 MCG/ACT AEPB INHALE 1 PUFF INTO THE LUNGS daily   furosemide  (LASIX ) 40 MG tablet TAKE 1 TABLET BY MOUTH DAILY AS NEEDED (WEIGHT GAIN OF 3 LBS OVERNIGHT OR 5 LBS IN A WEEK). (Patient taking differently: Take 20-40 mg by mouth See admin instructions. 20 mg Sun Tues Thurs Sat, 40 mg Mon Wed Fri)   HYDROcodone -acetaminophen  (  NORCO/VICODIN) 5-325 MG tablet Take 1 tablet by mouth every 6 (six) hours as needed for moderate pain (pain score 4-6) or severe pain (pain score 7-10).   [START ON 10/09/2023] HYDROcodone -acetaminophen  (NORCO/VICODIN) 5-325 MG tablet Take 1 tablet by mouth every 6 (six) hours as needed for moderate pain (pain score 4-6) or severe pain (pain score 7-10).   HYDROcodone -acetaminophen  (NORCO/VICODIN) 5-325 MG tablet Take 1 tablet by mouth every 6 (six) hours as needed for moderate pain (pain score 4-6) or severe pain (pain score 7-10).   insulin  aspart (NOVOLOG ) 100  UNIT/ML injection PER SLIDING SCALE: 190 - 200 = 2 UNITS. 300 AND ABOVE = 7 UNITS. (Patient taking differently: Inject 2-9 Units into the skin 3 (three) times daily as needed for high blood sugar. PER SLIDING SCALE: 190 - 200 = 2 UNITS. 300 AND ABOVE = 7 UNITS.)   insulin  glargine (LANTUS ) 100 UNIT/ML injection INJECT 0.4 MLS (40 UNITS TOTAL) INTO THE SKIN IN THE MORNING.   Insulin  Syringe-Needle U-100 (B-D INS SYR ULTRAFINE 1CC/30G) 30G X 1/2 1 ML MISC Use 4 times a day with insulin  Dx E11.9   Insulin  Syringes, Disposable, U-100 1 ML MISC Use 4 times a day for insulin  injection Dx E11.9   ipratropium-albuterol  (DUONEB) 0.5-2.5 (3) MG/3ML SOLN INHALE CONTENTS OF 1 VIAL VIA NEBULIZER EVERY 4 HOURS AS NEEDED (Patient taking differently: Inhale 3 mLs into the lungs every 4 (four) hours as needed. Uses on a daily basis at most every 4 to 6 hours)   JARDIANCE  10 MG TABS tablet TAKE 1 TABLET BY MOUTH EVERY DAY   levalbuterol  (XOPENEX ) 0.31 MG/3ML nebulizer solution INHALE 3 MLS (0.31 MG TOTAL) BY NEBULIZATION EVERY 4 (FOUR) HOURS AS NEEDED FOR WHEEZING. (Patient taking differently: Take 1 ampule by nebulization 2 (two) times daily.)   losartan  (COZAAR ) 50 MG tablet TAKE 1 TABLET BY MOUTH EVERY DAY   metoprolol  succinate (TOPROL -XL) 100 MG 24 hr tablet Take 1 tablet (100 mg total) by mouth 2 (two) times daily. Take with or immediately following a meal.   naloxone  (NARCAN ) nasal spray 4 mg/0.1 mL Place 1 spray into the nose as needed (For opiate overdose).   omeprazole  (PRILOSEC) 40 MG capsule Take 1 capsule (40 mg total) by mouth daily.   OVER THE COUNTER MEDICATION Take 1-2 tablets by mouth See admin instructions. Super Beets gummies- Chew 1-2 gummies by mouth every day   OXYGEN  Inhale 3 L/min into the lungs continuous.   potassium chloride  (KLOR-CON ) 10 MEQ tablet Take 1 tablet (10 mEq total) by mouth daily.   rivaroxaban  (XARELTO ) 20 MG TABS tablet TAKE 1 TABLET BY MOUTH DAILY  WITH SUPPER   No  facility-administered encounter medications on file as of 10/08/2023.    Past Surgical History:  Procedure Laterality Date   BIOPSY OF SKIN SUBCUTANEOUS TISSUE AND/OR MUCOUS MEMBRANE  06/16/2023   Procedure: BIOPSY, SKIN, SUBCUTANEOUS TISSUE, OR MUCOUS MEMBRANE;  Surgeon: Abran Norleen SAILOR, MD;  Location: [facility];  Service: Gastroenterology;;   CARDIOVERSION N/A 01/20/2021   Procedure: CARDIOVERSION;  Surgeon: Okey Vina GAILS, MD;  Location: [facility] END[facility]COPY;  Service: Cardiovascular;  Laterality: N/A;   CARDIOVERSION N/A 07/12/2023   Procedure: CARDIOVERSION;  Surgeon: Mona Vinie BROCKS, MD;  Location: [facility]AS[facility] CV LAB;  Service: Cardiovascular;  Laterality: N/A;   COLONOSCOPY WITH PROPOFOL  N/A 06/16/2023   Procedure: COLONOSCOPY WITH PROPOFOL ;  Surgeon: Abran Norleen SAILOR, MD;  Location: [facility];  Service: Gastroenterology;  Laterality: N/A;   CYSTOSCOPY WITH URETEROSCOPY Right  08/14/2013   Procedure: CYSTOSCOPY WITH URETEROSCOPY BLADDER BIOPSY ;  Surgeon: Mark C Ottelin, MD;  Location: [facility];  Service: Urology;  Laterality: Right;   ESOPHAGOGASTRODUODENOSCOPY N/A 02/06/2013   Procedure: ESOPHAGOGASTRODUODENOSCOPY (EGD);  Surgeon: Princella CHRISTELLA Nida, MD;  Location: [facility] ENDOSCOPY;  Service: Endoscopy;  Laterality: N/A;   ESOPHAGOGASTRODUODENOSCOPY (EGD) WITH PROPOFOL  N/A 06/16/2023   Procedure: ESOPHAGOGASTRODUODENOSCOPY (EGD) WITH PROPOFOL ;  Surgeon: Abran Norleen SAILOR, MD;  Location: [facility];  Service: Gastroenterology;  Laterality: N/A;   LAPAROSCOPY N/A 11/25/2014   Procedure: LAPAROSCOPIC PRIMARY REPAIR OF PERFORATED PREPYLORIC ULCER WITH ARLYSS LISLE;  Surgeon: Camellia Blush, MD;  Location: [facility];  Service: General;  Laterality: N/A;   TONSILLECTOMY  as child   TRANSTHORACIC ECHOCARDIOGRAM  02-17-2011   MODERATE LVH/  EF 65%   TRANSURETHRAL RESECTION OF BLADDER TUMOR WITH GYRUS (TURBT-GYRUS) N/A 06/12/2013   Procedure: TRANSURETHRAL RESECTION OF BLADDER TUMOR WITH GYRUS (TURBT-GYRUS);   Surgeon: Mark C Ottelin, MD;  Location: [facility];  Service: Urology;  Laterality: N/A;   TYMPANIC MEMBRANE REPAIR  as child    Family History  Problem Relation Age of Onset   Breast cancer Mother    Cancer Mother        Breast   Rheumatic fever Father    Heart disease Father    Heart attack Father        Massive    Diabetes Son       Controlled substance contract: n/a     Review of Systems  Constitutional:  Negative for diaphoresis.  Eyes:  Negative for pain.  Respiratory:  Negative for shortness of breath.   Cardiovascular:  Negative for chest pain, palpitations and leg swelling.  Gastrointestinal:  Negative for abdominal pain.  Endocrine: Negative for polydipsia.  Skin:  Negative for rash.  Neurological:  Negative for dizziness, weakness and headaches.  Hematological:  Does not bruise/bleed easily.  All other systems reviewed and are negative.      Objective:   Physical Exam Vitals and nursing note reviewed.  Constitutional:      Appearance: Normal appearance. He is well-developed.  HENT:     Head: Normocephalic.     Nose: Nose normal.     Mouth/Throat:     Mouth: Mucous membranes are moist.     Pharynx: Oropharynx is clear.  Eyes:     Pupils: Pupils are equal, round, and reactive to light.  Neck:     Thyroid: No thyroid mass or thyromegaly.     Vascular: No carotid bruit or JVD.     Trachea: Phonation normal.  Cardiovascular:     Rate and Rhythm: Normal rate and regular rhythm.  Pulmonary:     Effort: Pulmonary effort is normal. No respiratory distress.     Breath sounds: Normal breath sounds.     Comments: Oxygen  cannula is in his mouth Abdominal:     General: Bowel sounds are normal.     Palpations: Abdomen is soft.     Tenderness: There is no abdominal tenderness.  Musculoskeletal:        General: Normal range of motion.     Cervical back: Normal range of motion and neck supple.     Right lower leg: No edema.     Left lower  leg: No edema.  Lymphadenopathy:     Cervical: No cervical adenopathy.  Skin:    General: Skin is warm and dry.  Neurological:     Mental Status: He is alert  and oriented to person, place, and time.  Psychiatric:        Behavior: Behavior normal.        Thought Content: Thought content normal.        Judgment: Judgment normal.    BP 124/63   Pulse 98   Temp (!) 96.9 F (36.1 C) (Temporal)   Ht 5' 8 (1.727 m)   Wt 176 lb (79.8 kg)   SpO2 94% Comment: 2 liters O2  BMI 26.76 kg/m      HGBA1c 5.8%     Assessment & Plan:  Thomas JONETTA Dollene Mickey. comes in today with chief complaint of medical management of chronic issues    Diagnosis and orders addressed:  1. Essential hypertension Low sodium diet - CBC with Differential/Platelet - CMP14+EGFR - losartan  (COZAAR ) 50 MG tablet; Take 1 tablet (50 mg total) by mouth daily.  Dispense: 90 tablet; Refill: 1  2. Persistent atrial fibrillation (HCC) Avoid caffeine - rivaroxaban  (XARELTO ) 20 MG TABS tablet; Take 1 tablet (20 mg total) by mouth daily with supper.  Dispense: 30 tablet; Refill: 5  3. Chronic combined systolic and diastolic CHF (congestive heart failure) (HCC) Continue oxygen   - metoprolol  succinate (TOPROL -XL) 50 MG 24 hr tablet; Take 1.5 tablets (75 mg total) by mouth 2 (two) times daily. Take with or immediately following a meal.  Dispense: 270 tablet; Refill: 2 - furosemide  (LASIX ) 40 MG tablet; TAKE 1 TABLET BY MOUTH DAILY AS NEEDED (WEIGHT GAIN OF 3 LBS OVERNIGHT OR 5 LBS IN A WEEK).  Dispense: 90 tablet; Refill: 1  4. COPD with chronic bronchitis and emphysema (HCC) Stop smoking  5. Chronic respiratory failure with hypoxia (HCC)  6. Uncontrolled type 1 diabetes mellitus with hypoglycemia, with long-term current use of insulin  (HCC) Continue to watch carbs in diet - Bayer DCA Hb A1c Waived - Lipid panel - insulin  glargine (LANTUS ) 100 UNIT/ML injection; INJECT 0.4 MLS (40 UNITS TOTAL) INTO THE SKIN IN THE  MORNING.  Dispense: 30 mL; Refill: 0  7. Alcohol-induced chronic pancreatitis (HCC) Avoid alcohol  8. Mild renal insufficiency Labs oending  9. Tobacco use Please stop smoking  10. Chronic obstructive pulmonary disease, unspecified (HCC) - Fluticasone -Umeclidin-Vilant (TRELEGY ELLIPTA ) 100-62.5-25 MCG/ACT AEPB; INHALE 1 PUFF INTO THE LUNGS TWICE A DAY  Dispense: 60 each; Refill: 5  11. Gastroesophageal reflux disease with esophagitis without hemorrhage Avoid spicy foods Do not eat 2 hours prior to bedtime - famotidine  (PEPCID ) 20 MG tablet; Take 1 tablet (20 mg total) by mouth every ` morning. Reported on 04/16/2015  Dispense: 90 tablet; Refill: 1  12. Prostate cancer screening Labs pending - PSA, total and free   Labs pending Health Maintenance reviewed Diet and exercise encouraged  Follow up plan: 6 months   [name] Gladis, FNP

## 2023-10-08 NOTE — Telephone Encounter (Signed)
 NOVOLOG  100 UNIT/ML injection        Changed from: insulin  aspart (NOVOLOG ) 100 UNIT/ML injection   Pharmacy comment: Alternative Requested:PRIMARY INS NEEDS PA; SECONDARY WONT PAY IF PRIMARY ISNT PAYING; NEEDS PRIOR AUTH OR CONSIDER ALTERNATIVE.

## 2023-10-08 NOTE — Patient Instructions (Signed)
 Home Oxygen Use, Adult When a medical condition keeps you from getting enough oxygen, your health care provider may have you use extra oxygen at home. Your health care provider will let you know: When to use oxygen. How much oxygen to use. The amount is set in liters per minute (LPM or L/M). How long to use oxygen. Home oxygen can be given through: A mask. This covers the nose and mouth or covers a tracheotomy tube. A nasal cannula. This is a device or tube that goes in the nostrils. A transtracheal catheter. This is a small, thin tube placed through the neck and into the windpipe (trachea). A breathing tube (tracheostomy tube) that is surgically placed in the windpipe. These devices have tubing that connects to an oxygen source, such as: A tank. Tanks hold oxygen in gas form. The tank must be replaced when the oxygen is used up. A liquid oxygen device. This holds oxygen in liquid form. This must be replaced when the oxygen is used up. An oxygen concentrator machine. This filters oxygen in the room. It uses electricity so you must have a backup oxygen tank in case the power goes out. Work with your health care provider to find equipment that works best for you and your lifestyle. What are the risks? Your health care provider will talk with you about risks. These may include: Fire. This can happen if the oxygen is exposed to a heat source, flame, or spark. Injury to the skin. This can happen if liquid oxygen touches the skin. Pressure sores may occur if the oxygen tubing presses on the skin. Damage to the lungs or other organs. This can happen from getting too little or too much oxygen. Supplies needed: To use oxygen, you will need: A mask, nasal cannula, transtracheal catheter, or tracheostomy. An oxygen tank, a liquid oxygen device, or an oxygen concentrator. Your health care provider may recommend: A humidifier. This device adds moisture to the oxygen. A pulse oximeter. This device  measures the percentage of oxygen in your blood. How to use oxygen You will be shown how to use your oxygen device. Follow the instructions, which may look something like this: Wash your hands with soap and water for at least 20 seconds. Turn on the oxygen. Make sure the oxygen unit is working right. To do this: Place the end of the oxygen tubing in a cup of water before connecting it to the nasal cannula, mask, or transtracheal catheter. The water will bubble if oxygen is flowing. Place one end of the oxygen tubing into the port on the tank, device, or machine. Connect the other end to the nasal cannula, mask, or transtracheal catheter. Turn the liter-flow setting on the machine to the level you are told. Place the nasal cannula in your nose, or place the mask over your mouth and nose or tracheostomy tube. Turn off the oxygen when you are not using it. How to clean and care for the oxygen supplies Clean or replace your oxygen equipment and supplies as told by the medical device company that supplies the equipment. Safety tips Fire safety tips  Keep your oxygen and oxygen supplies at least 6 ft (2 m) away from sources of heat, flames, and sparks at all times. Do not allow smoking near your oxygen. Put up "no smoking" signs in your home. Avoid smoking areas in public. Do not use materials that can burn (are flammable) while you use oxygen. This includes: Petroleum jelly. Hand sanitizer. Rubbing alcohol. Hair spray  or other aerosol sprays. Keep a Government social research officer nearby. Tell your fire department that you have oxygen in your home. Test your home smoke detectors often. Traveling Secure your oxygen tank in the vehicle so that it does not move. Follow instructions from your medical device company about how to safely secure your tank. Have enough oxygen for the amount of time you will be away from home. If you plan to travel by public transportation such as airplane, train, bus, or boat,  contact the company to arrange a portable oxygen delivery system. You may need documents from your health care provider and medical device company before you travel. General safety tips Keep extra supplies on hand, including extra tubing and an extra cannula or mask. If you use an oxygen cylinder, keep it in a stand or secure it to an object that will not move. If you use liquid oxygen, always keep the container upright. If you use an oxygen concentrator: Tell Museum/gallery curator company. Make sure you are given priority service if your power goes out. Avoid using extension cords if possible. Keep an extra supply of backup oxygen tanks. Follow these instructions at home: Use oxygen only as told by your health care provider. Do not use alcohol or other drugs that make you relax (sedating drugs) unless told. They can slow down your breathing rate and make it hard to get in enough oxygen. Know how and when to order a refill of oxygen. Plan for holidays when you may not be able to get a prescription filled. Use water-based lubricants on your lips or nostrils. Do not use oil-based products like petroleum jelly. Ask your health care provider how to prevent skin irritation on your cheeks or behind your ears. Contact a health care provider if: You are more tired than normal and have little energy. You have dry or irritated skin in your nose or on your face. You have nosebleeds. You are restless, irritable, or anxious. You get headaches often. You are not sleeping well. Get help right away if: You have trouble breathing. You are confused. You are sleepy all the time. You have blue lips or fingernails. These symptoms may be an emergency. Get help right away. Call 911. Do not wait to see if the symptoms will go away. Do not drive yourself to the hospital. This information is not intended to replace advice given to you by your health care provider. Make sure you discuss any questions you have with your  health care provider. Document Revised: 09/23/2021 Document Reviewed: 09/23/2021 Elsevier Patient Education  2024 [employer].

## 2023-10-08 NOTE — Addendum Note (Signed)
 Addended by: Teaghan Melrose, [name] on: 10/08/2023 02:52 PM   Modules accepted: Orders

## 2023-10-09 ENCOUNTER — Other Ambulatory Visit (HOSPITAL_COMMUNITY): Payer: Self-pay

## 2023-10-09 LAB — CBC WITH DIFFERENTIAL/PLATELET
Basophils Absolute: 0.1 x10E3/uL (ref 0.0–0.2)
Basos: 1 %
EOS (ABSOLUTE): 0.3 x10E3/uL (ref 0.0–0.4)
Eos: 4 %
Hematocrit: 37.4 % — ABNORMAL LOW (ref 37.5–51.0)
Hemoglobin: 12 g/dL — ABNORMAL LOW (ref 13.0–17.7)
Immature Grans (Abs): 0 x10E3/uL (ref 0.0–0.1)
Immature Granulocytes: 0 %
Lymphocytes Absolute: 1.5 x10E3/uL (ref 0.7–3.1)
Lymphs: 21 %
MCH: 32.4 pg (ref 26.6–33.0)
MCHC: 32.1 g/dL (ref 31.5–35.7)
MCV: 101 fL — ABNORMAL HIGH (ref 79–97)
Monocytes Absolute: 0.9 x10E3/uL (ref 0.1–0.9)
Monocytes: 12 %
Neutrophils Absolute: 4.4 x10E3/uL (ref 1.4–7.0)
Neutrophils: 61 %
Platelets: 287 x10E3/uL (ref 150–450)
RBC: 3.7 x10E6/uL — ABNORMAL LOW (ref 4.14–5.80)
RDW: 13.2 % (ref 11.6–15.4)
WBC: 7.2 x10E3/uL (ref 3.4–10.8)

## 2023-10-09 LAB — LIPID PANEL
Chol/HDL Ratio: 2.7 ratio (ref 0.0–5.0)
Cholesterol, Total: 125 mg/dL (ref 100–199)
HDL: 46 mg/dL (ref >39)
LDL Chol Calc (NIH): 64 mg/dL (ref 0–99)
Triglycerides: 72 mg/dL (ref 0–149)
VLDL Cholesterol Cal: 15 mg/dL (ref 5–40)

## 2023-10-09 LAB — CMP14+EGFR
ALT: 5 IU/L (ref 0–44)
AST: 20 IU/L (ref 0–40)
Albumin: 4.4 g/dL (ref 3.9–4.9)
Alkaline Phosphatase: 75 IU/L (ref 44–121)
BUN/Creatinine Ratio: 12 (ref 10–24)
BUN: 16 mg/dL (ref 8–27)
Bilirubin Total: 0.5 mg/dL (ref 0.0–1.2)
CO2: 27 mmol/L (ref 20–29)
Calcium: 10.4 mg/dL — ABNORMAL HIGH (ref 8.6–10.2)
Chloride: 97 mmol/L (ref 96–106)
Creatinine, Ser: 1.34 mg/dL — ABNORMAL HIGH (ref 0.76–1.27)
Globulin, Total: 2.5 g/dL (ref 1.5–4.5)
Glucose: 66 mg/dL — ABNORMAL LOW (ref 70–99)
Potassium: 4.6 mmol/L (ref 3.5–5.2)
Sodium: 140 mmol/L (ref 134–144)
Total Protein: 6.9 g/dL (ref 6.0–8.5)
eGFR: 58 mL/min/1.73 — ABNORMAL LOW (ref >59)

## 2023-10-09 LAB — MICROALBUMIN / CREATININE URINE RATIO
Creatinine, Urine: 34.1 mg/dL
Microalb/Creat Ratio: 78 mg/g{creat} — ABNORMAL HIGH (ref 0–29)
Microalbumin, Urine: 26.7 ug/mL

## 2023-10-11 ENCOUNTER — Ambulatory Visit: Payer: Self-pay | Admitting: Nurse Practitioner

## 2023-10-11 ENCOUNTER — Other Ambulatory Visit (HOSPITAL_COMMUNITY): Payer: Self-pay

## 2023-10-11 ENCOUNTER — Other Ambulatory Visit: Payer: Self-pay | Admitting: Nurse Practitioner

## 2023-10-11 ENCOUNTER — Telehealth: Payer: Self-pay

## 2023-10-11 DIAGNOSIS — E10649 Type 1 diabetes mellitus with hypoglycemia without coma: Secondary | ICD-10-CM

## 2023-10-11 MED ORDER — INSULIN LISPRO 100 UNIT/ML IJ SOLN: INTRAMUSCULAR | 3 refills | Status: DC

## 2023-10-11 NOTE — Progress Notes (Signed)
 Meds ordered this encounter  Medications   insulin  lispro (HUMALOG ) 100 UNIT/ML injection    Sig: PER SLIDING SCALE: 190 - 200 = 2 UNITS. 200-300= 4u 300 AND ABOVE = 7 UNITS.    Dispense:  18 mL    Refill:  3    Supervising Provider:   MARYANNE CHEW A [[id number]]   [name] Gladis, FNP

## 2023-10-11 NOTE — Telephone Encounter (Signed)
 Had  to change novalog to humalog - continue same sliding scale with new med.

## 2023-10-11 NOTE — Telephone Encounter (Signed)
**Note De-identified  [place] Obfuscation** Please advise 

## 2023-10-11 NOTE — Telephone Encounter (Signed)
 Pharmacy Patient Advocate Encounter   Received notification from Pt Calls Messages that prior authorization for NovoLOG  100UNIT/ML solution is required/requested.   Insurance verification completed.   The patient is insured through [facility] .   Per test claim:  HUMALOG  OR INSULIN  LISPRO OR LYUMJEV  is preferred by the insurance.  If suggested medication is appropriate, Please send in a new RX and discontinue this one. If not, please advise as to why it's not appropriate so that we may request a Prior Authorization. Please note, some preferred medications may still require a PA.  If the suggested medications have not been trialed and there are no contraindications to their use, the PA will not be submitted, as it will not be approved.   Key: [id number]

## 2023-10-11 NOTE — Telephone Encounter (Signed)
 PA request has been Started. New Encounter has been or will be created for follow up. For additional info see Pharmacy Prior Auth telephone encounter from 10/11/23.

## 2023-10-19 ENCOUNTER — Other Ambulatory Visit: Payer: Self-pay | Admitting: Cardiology

## 2023-10-19 ENCOUNTER — Ambulatory Visit
Admission: RE | Admit: 2023-10-19 | Discharge: 2023-10-19 | Disposition: A | Discharge status: Home / Self Care | Source: Ambulatory Visit | Attending: Emergency Medicine | Admitting: Emergency Medicine

## 2023-10-19 DIAGNOSIS — R911 Solitary pulmonary nodule: Secondary | ICD-10-CM

## 2023-10-19 DIAGNOSIS — I4819 Other persistent atrial fibrillation: Secondary | ICD-10-CM

## 2023-10-20 DIAGNOSIS — J9601 Acute respiratory failure with hypoxia: Secondary | ICD-10-CM | POA: Diagnosis not present

## 2023-10-27 ENCOUNTER — Telehealth: Payer: Self-pay | Admitting: Emergency Medicine

## 2023-10-27 NOTE — Telephone Encounter (Signed)
 Patient had CT scan done on August 12 and would like to know the results. I advised the patient that the results may not be in just yet since he had the CT completed last week. Patient currently has an appointment scheduled for October but would like to know the results when they become available. Please call and advise.

## 2023-10-27 NOTE — Telephone Encounter (Signed)
 CT has been read. Ct needs to be advised by pulmonologist first before pt can be informed. I called and informed pt. Pt verbalized understanding. Dr Shelah, please advise CT results at your convenience.

## 2023-10-28 NOTE — Telephone Encounter (Signed)
 I reviewed the CT scan results with Thomas Sandoval by phone.  She will discuss with Thomas Sandoval. His CT chest shows a stable pulmonary nodule compared with prior, possibly smaller than more remote priors.  There may be some associated calcification.  Reassuring results.  He has follow-up in October and we will decide the timing of any repeat scanning at that visit.

## 2023-11-03 ENCOUNTER — Telehealth: Payer: Self-pay | Admitting: Internal Medicine

## 2023-11-03 NOTE — Telephone Encounter (Signed)
 Pt states the omeprazole  is not suiting him. States his stomach hurts and he is a diabetic and when he feels his sugar dropping he needs to eat something but he has no appetite. Thinks the omeprazole  is causing his decreased appetite. States he just drinks a mountain dew when this happens. Pt scheduled to see Alan Coombs PA 11/11/23@1 :30pm. Pt and pts wife aware of appt.

## 2023-11-03 NOTE — Telephone Encounter (Signed)
 Inbound call from patient stating while taking pantoprazole  medication it is causing he stomach to be very upset and has diarrhea. Also stated omeprazole  medication causes a loss of appetite. Stated Dr. Abran prescribed both medications. Requesting a call back to discuss further and possible alternatives. Please advise, thank you

## 2023-11-09 ENCOUNTER — Other Ambulatory Visit (HOSPITAL_COMMUNITY): Payer: Self-pay

## 2023-11-10 ENCOUNTER — Other Ambulatory Visit: Payer: Self-pay | Admitting: Nurse Practitioner

## 2023-11-11 ENCOUNTER — Encounter: Payer: Self-pay | Admitting: Physician Assistant

## 2023-11-11 ENCOUNTER — Other Ambulatory Visit: Payer: Self-pay | Admitting: Nurse Practitioner

## 2023-11-11 ENCOUNTER — Other Ambulatory Visit

## 2023-11-11 ENCOUNTER — Ambulatory Visit: Admitting: Physician Assistant

## 2023-11-11 VITALS — BP 110/60 | HR 77

## 2023-11-11 DIAGNOSIS — N189 Chronic kidney disease, unspecified: Secondary | ICD-10-CM

## 2023-11-11 DIAGNOSIS — E10649 Type 1 diabetes mellitus with hypoglycemia without coma: Secondary | ICD-10-CM

## 2023-11-11 DIAGNOSIS — E1022 Type 1 diabetes mellitus with diabetic chronic kidney disease: Secondary | ICD-10-CM

## 2023-11-11 DIAGNOSIS — Z9981 Dependence on supplemental oxygen: Secondary | ICD-10-CM

## 2023-11-11 DIAGNOSIS — R630 Anorexia: Secondary | ICD-10-CM

## 2023-11-11 DIAGNOSIS — N289 Disorder of kidney and ureter, unspecified: Secondary | ICD-10-CM

## 2023-11-11 DIAGNOSIS — Z794 Long term (current) use of insulin: Secondary | ICD-10-CM | POA: Diagnosis not present

## 2023-11-11 DIAGNOSIS — R634 Abnormal weight loss: Secondary | ICD-10-CM

## 2023-11-11 DIAGNOSIS — I4811 Longstanding persistent atrial fibrillation: Secondary | ICD-10-CM

## 2023-11-11 DIAGNOSIS — D509 Iron deficiency anemia, unspecified: Secondary | ICD-10-CM

## 2023-11-11 DIAGNOSIS — J441 Chronic obstructive pulmonary disease with (acute) exacerbation: Secondary | ICD-10-CM | POA: Diagnosis not present

## 2023-11-11 DIAGNOSIS — J9611 Chronic respiratory failure with hypoxia: Secondary | ICD-10-CM

## 2023-11-11 DIAGNOSIS — Z7901 Long term (current) use of anticoagulants: Secondary | ICD-10-CM | POA: Diagnosis not present

## 2023-11-11 LAB — CBC WITH DIFFERENTIAL/PLATELET
Basophils Absolute: 0.1 K/uL (ref 0.0–0.1)
Basophils Relative: 1 % (ref 0.0–3.0)
Eosinophils Absolute: 0.1 K/uL (ref 0.0–0.7)
Eosinophils Relative: 1.9 % (ref 0.0–5.0)
HCT: 36.4 % — ABNORMAL LOW (ref 39.0–52.0)
Hemoglobin: 11.9 g/dL — ABNORMAL LOW (ref 13.0–17.0)
Lymphocytes Relative: 15 % (ref 12.0–46.0)
Lymphs Abs: 0.9 K/uL (ref 0.7–4.0)
MCHC: 32.8 g/dL (ref 30.0–36.0)
MCV: 103.5 fl — ABNORMAL HIGH (ref 78.0–100.0)
Monocytes Absolute: 0.7 K/uL (ref 0.1–1.0)
Monocytes Relative: 10.9 % (ref 3.0–12.0)
Neutro Abs: 4.3 K/uL (ref 1.4–7.7)
Neutrophils Relative %: 71.2 % (ref 43.0–77.0)
Platelets: 186 K/uL (ref 150.0–400.0)
RBC: 3.51 Mil/uL — ABNORMAL LOW (ref 4.22–5.81)
RDW: 16.4 % — ABNORMAL HIGH (ref 11.5–15.5)
WBC: 6.1 K/uL (ref 4.0–10.5)

## 2023-11-11 LAB — COMPREHENSIVE METABOLIC PANEL WITH GFR
ALT: 5 U/L (ref 0–53)
AST: 16 U/L (ref 0–37)
Albumin: 3.9 g/dL (ref 3.5–5.2)
Alkaline Phosphatase: 58 U/L (ref 39–117)
BUN: 18 mg/dL (ref 6–23)
CO2: 34 meq/L — ABNORMAL HIGH (ref 19–32)
Calcium: 8.8 mg/dL (ref 8.4–10.5)
Chloride: 95 meq/L — ABNORMAL LOW (ref 96–112)
Creatinine, Ser: 1.43 mg/dL (ref 0.40–1.50)
GFR: 51.21 mL/min — ABNORMAL LOW (ref >60.00)
Glucose, Bld: 112 mg/dL — ABNORMAL HIGH (ref 70–99)
Potassium: 4.2 meq/L (ref 3.5–5.1)
Sodium: 135 meq/L (ref 135–145)
Total Bilirubin: 0.7 mg/dL (ref 0.2–1.2)
Total Protein: 6.5 g/dL (ref 6.0–8.3)

## 2023-11-11 LAB — IBC + FERRITIN
Ferritin: 52.7 ng/mL (ref 22.0–322.0)
Iron: 185 ug/dL — ABNORMAL HIGH (ref 42–165)
Saturation Ratios: 64.8 % — ABNORMAL HIGH (ref 20.0–50.0)
TIBC: 285.6 ug/dL (ref 250.0–450.0)
Transferrin: 204 mg/dL — ABNORMAL LOW (ref 212.0–360.0)

## 2023-11-11 NOTE — Progress Notes (Unsigned)
 Cardiology Office Note:    Date:  11/12/2023   ID:  Thomas JONETTA Dollene Mickey., DOB 29-Jan-1956, MRN [id number]  PCP:  Gladis Mustard, FNP  Cardiologist:  Lonni LITTIE Nanas, MD  Electrophysiologist:  OLE ONEIDA HOLTS, MD   Referring MD: Gladis Mustard, *   Chief Complaint  Patient presents with   Atrial Fibrillation    History of Present Illness:    Thomas Redmon. is a 68 y.o. male with a hx of alcohol abuse, tobacco abuse, COPD, perforated gastric ulcer status postrepair in 2016, T2DM, hypertension, PAD, MVP, atrial fibrillation/flutter, systolic heart failure who presents for follow-up.  He was hospitalized September 2022 with acute pancreatitis found to be in A. fib.  Echo at that time showed EF 30 to 35%.  He left AMA.  At follow-up with his PCP 12/17/2020 he was still in A. fib and started on Xarelto  and diltiazem .  He was referred to A. fib clinic, seen on 12/25/2020.  He underwent cardioversion on 01/20/2021 with successful restoration of sinus rhythm.  Echocardiogram 08/26/2017 showed EF 55 to 60%, mild mitral valve prolapse.  Echo 12/05/2020 showed EF 30 to 35%, global hypokinesis, and low normal RV function, moderate left atrial enlargement, trivial MR.  Zio patch x3 days on 02/10/2021 showed high percent A. fib burden with average rate 95 bpm.  Echocardiogram 05/21/2021 showed EF 55 to 60%, grade 2 diastolic dysfunction, normal RV function, no significant valvular disease.  He was admitted 11/2021 after being found unresponsive with significant hypoglycemia and hypotension.  EKG with aVR elevation and diffuse ST depressions, suggesting global ischemia.  EKG normalized with correction of his hypoglycemia.  He was seen by cardiology and outpatient coronary CT was recommended.  Coronary CTA was attempted but he was in A-fib on presentation and CT was canceled.  He was admitted for Tikosyn  loading 02/2022.  Converted to sinus rhythm on Tikosyn .   Underwent successful  cardioversion 07/12/2023 but had quick return of atrial flutter; EP recommended rate control only, Tikosyn  discontinued  Stress PET on 10/07/2022 showed small reversible perfusion defect in apical to mid inferior wall, mildly abnormal myocardial blood flow reserve (1.78).  Since last clinic visit,  he reports he is doing OK.  Continues to have dyspnea.  Denies any chest pain.  Reports intermittent edema. Denies any lightheadedness, syncope, or palpitations.  Continues to smoke 0.5-1 ppd.  He is drinking 2-3 drinks (vodka) per week.   Wt Readings from Last 3 Encounters:  11/12/23 170 lb (77.1 kg)  10/08/23 176 lb (79.8 kg)  08/11/23 176 lb 9.6 oz (80.1 kg)    Past Medical History:  Diagnosis Date   Arthritis    Bladder cancer (HCC)    Chronic back pain    Chronic combined systolic and diastolic CHF (congestive heart failure) (HCC) 07/16/2021   COPD (chronic obstructive pulmonary disease) (HCC)    COPD with chronic bronchitis and emphysema (HCC)    DDD (degenerative disc disease)    Diabetic retinopathy of both eyes (HCC)    Essential hypertension    GERD (gastroesophageal reflux disease)    History of atrial flutter 02/07/2011   Converted to NSR with Cardizem    History of chronic bronchitis    History of hemolytic anemia 02/07/2011   secondary to Avelox    HOH (hard of hearing)    HOH (hard of hearing)    no eardrum and nerve damage on R, also HOH on L   Mild renal insufficiency 08/25/2017   Mitral valve  prolapse    a. 2D Echo 11/27/14: EF 55-60%; images were inadequate for LV wall motion assessment, + mild late systolic mitral valve prolapse involving the anterior leaflet.   PAD (peripheral artery disease) (HCC) 04/09/2014   Dr Serene; bilateral SFA occlusion, R mid, L distal   PAF (paroxysmal atrial fibrillation) (HCC)    a. Dx 11/2014 during admission for perf ulcer.   Perforated ulcer (HCC)    a. 11/2014 s/p surgery.   Productive cough    Smokers' cough (HCC)    Type 1  diabetes mellitus (HCC) 03/10/1975    Past Surgical History:  Procedure Laterality Date   BIOPSY OF SKIN SUBCUTANEOUS TISSUE AND/OR MUCOUS MEMBRANE  06/16/2023   Procedure: BIOPSY, SKIN, SUBCUTANEOUS TISSUE, OR MUCOUS MEMBRANE;  Surgeon: Abran Norleen SAILOR, MD;  Location: [facility];  Service: Gastroenterology;;   CARDIOVERSION N/A 01/20/2021   Procedure: CARDIOVERSION;  Surgeon: Okey Vina GAILS, MD;  Location: [facility];  Service: Cardiovascular;  Laterality: N/A;   CARDIOVERSION N/A 07/12/2023   Procedure: CARDIOVERSION;  Surgeon: Mona Vinie BROCKS, MD;  Location: [facility];  Service: Cardiovascular;  Laterality: N/A;   COLONOSCOPY WITH PROPOFOL  N/A 06/16/2023   Procedure: COLONOSCOPY WITH PROPOFOL ;  Surgeon: Abran Norleen SAILOR, MD;  Location: [facility];  Service: Gastroenterology;  Laterality: N/A;   CYSTOSCOPY WITH URETEROSCOPY Right 08/14/2013   Procedure: CYSTOSCOPY WITH URETEROSCOPY BLADDER BIOPSY ;  Surgeon: Oneil BROCKS Rafter, MD;  Location: [facility];  Service: Urology;  Laterality: Right;   ESOPHAGOGASTRODUODENOSCOPY N/A 02/06/2013   Procedure: ESOPHAGOGASTRODUODENOSCOPY (EGD);  Surgeon: Princella CHRISTELLA Nida, MD;  Location: [facility] ENDOSCOPY;  Service: Endoscopy;  Laterality: N/A;   ESOPHAGOGASTRODUODENOSCOPY (EGD) WITH PROPOFOL  N/A 06/16/2023   Procedure: ESOPHAGOGASTRODUODENOSCOPY (EGD) WITH PROPOFOL ;  Surgeon: Abran Norleen SAILOR, MD;  Location: [facility];  Service: Gastroenterology;  Laterality: N/A;   LAPAROSCOPY N/A 11/25/2014   Procedure: LAPAROSCOPIC PRIMARY REPAIR OF PERFORATED PREPYLORIC ULCER WITH ARLYSS LISLE;  Surgeon: Camellia Blush, MD;  Location: [facility] OR;  Service: General;  Laterality: N/A;   TONSILLECTOMY  as child   TRANSTHORACIC ECHOCARDIOGRAM  02-17-2011   MODERATE LVH/  EF 65%   TRANSURETHRAL RESECTION OF BLADDER TUMOR WITH GYRUS (TURBT-GYRUS) N/A 06/12/2013   Procedure: TRANSURETHRAL RESECTION OF BLADDER TUMOR WITH GYRUS (TURBT-GYRUS);  Surgeon: Mark C Ottelin, MD;   Location: [facility];  Service: Urology;  Laterality: N/A;   TYMPANIC MEMBRANE REPAIR  as child    Current Medications: Current Meds  Medication Sig   Accu-Chek Softclix Lancets lancets Test BS in the morning, at noon and at bedtime Dx E10.649   Blood Glucose Monitoring Suppl (ACCU-CHEK GUIDE) w/Device KIT 4 (four) times daily.   Blood Glucose Monitoring Suppl DEVI 1 each by Does not apply route in the morning, at noon, and at bedtime. May substitute to any manufacturer covered by patient's insurance.   cefUROXime  (CEFTIN ) 250 MG tablet Take 250 mg by mouth as needed.   doxycycline  (VIBRA -TABS) 100 MG tablet Take 100 mg by mouth as needed.   empagliflozin  (JARDIANCE ) 10 MG TABS tablet Take 1 tablet (10 mg total) by mouth daily.   Fluticasone -Umeclidin-Vilant (TRELEGY ELLIPTA ) 100-62.5-25 MCG/ACT AEPB INHALE 1 PUFF INTO THE LUNGS daily   furosemide  (LASIX ) 40 MG tablet Take 0.5-1 tablets (20-40 mg total) by mouth See admin instructions. 20 mg Sun Tues Thurs Sat, 40 mg Mon Wed Fri   glucose blood (ACCU-CHEK GUIDE TEST) test strip Test BS in the morning, at noon and at bedtime Dx Z89.350  insulin  glargine (LANTUS ) 100 UNIT/ML injection INJECT 0.4 MLS (40 UNITS TOTAL) INTO THE SKIN IN THE MORNING.   insulin  lispro (HUMALOG ) 100 UNIT/ML injection PER SLIDING SCALE: 190 - 200 = 2 UNITS. 200-300= 4u 300 AND ABOVE = 7 UNITS.   Insulin  Syringe-Needle U-100 (B-D INS SYR ULTRAFINE 1CC/30G) 30G X 1/2 1 ML MISC Use 4 times a day with insulin  Dx E11.9   Insulin  Syringes, Disposable, U-100 1 ML MISC Use 4 times a day for insulin  injection Dx E11.9   ipratropium-albuterol  (DUONEB) 0.5-2.5 (3) MG/3ML SOLN INHALE CONTENTS OF 1 VIAL VIA NEBULIZER EVERY 4 HOURS AS NEEDED   levalbuterol  (XOPENEX ) 0.31 MG/3ML nebulizer solution INHALE 3 MLS (0.31 MG TOTAL) BY NEBULIZATION EVERY 4 (FOUR) HOURS AS NEEDED FOR WHEEZING.   losartan  (COZAAR ) 50 MG tablet Take 1 tablet (50 mg total) by mouth daily.    metoprolol  succinate (TOPROL -XL) 100 MG 24 hr tablet Take 1 tablet (100 mg total) by mouth 2 (two) times daily. Take with or immediately following a meal.   omeprazole  (PRILOSEC) 40 MG capsule Take 1 capsule (40 mg total) by mouth daily.   OVER THE COUNTER MEDICATION Take 1-2 tablets by mouth See admin instructions. Super Beets gummies- Chew 1-2 gummies by mouth every day   OXYGEN  Inhale 3 L/min into the lungs continuous.   potassium chloride  (KLOR-CON ) 10 MEQ tablet Take 1 tablet (10 mEq total) by mouth daily. (Patient taking differently: Take 10 mEq by mouth as needed.)     Allergies:   Avelox  [moxifloxacin  hcl in nacl], Azithromycin , and Bactrim [sulfamethoxazole -trimethoprim]   Social History   Socioeconomic History   Marital status: Married    Spouse name: Not on file   Number of children: Not on file   Years of education: Not on file   Highest education level: Not on file  Occupational History   Occupation: Tobacco Farmer  Tobacco Use   Smoking status: Every Day    Current packs/day: 0.50    Average packs/day: 0.5 packs/day for 30.0 years (15.0 ttl pk-yrs)    Types: Cigarettes   Smokeless tobacco: Former    Quit date: 06/08/1978   Tobacco comments:    11/12/2023 Patient smokes a pack daily    1/2 pk 03/23/23 NM, CMA   Vaping Use   Vaping status: Never Used  Substance and Sexual Activity   Alcohol use: Not Currently    Comment: 5 quarts per week 12/26/21   Drug use: No   Sexual activity: Not on file  Other Topics Concern   Not on file  Social History Narrative   Lives in [place] with wife and 2 sons.    Social Drivers of Corporate investment banker Strain: Low Risk  (04/12/2023)   Overall Financial Resource Strain (CARDIA)    Difficulty of Paying Living Expenses: Not very hard  Food Insecurity: Low Risk  (11/24/2022)   Received from [facility]   Hunger Vital Sign    Within the past 12 months, you worried that your food would run out before you got money to buy more:  Never true    Within the past 12 months, the food you bought just didn't last and you didn't have money to get more. : Never true  Transportation Needs: No Transportation Needs (11/24/2022)   Received from [facility]    In the past 12 months, has lack of reliable transportation kept you from medical appointments, meetings, work or from getting things needed for daily  living? : No  Physical Activity: Inactive (04/12/2023)   Exercise Vital Sign    Days of Exercise per Week: 0 days    Minutes of Exercise per Session: 0 min  Stress: No Stress Concern Present (04/12/2023)   [employer] of Occupational Health - Occupational Stress Questionnaire    Feeling of Stress : Not at all  Social Connections: Socially Isolated (04/12/2023)   Social Connection and Isolation Panel    Frequency of Communication with Friends and Family: Once a week    Frequency of Social Gatherings with Friends and Family: Never    Attends Religious Services: Never    Database administrator or Organizations: No    Attends Engineer, structural: Never    Marital Status: Married     Family History: The patient's family history includes Breast cancer in his mother; Cancer in his mother; Diabetes in his son; Heart attack in his father; Heart disease in his father; Rheumatic fever in his father.  ROS:   Please see the history of present illness.     All other systems reviewed and are negative.  EKGs/Labs/Other Studies Reviewed:    The following studies were reviewed today:   EKG:   11/12/2023: Atrial fibrillation, rate 76, QTc 441 01/19/2023: Atrial flutter, rate 97, QTc 441 09/29/22: AFL, rate 87, Qtc 459 06/24/22: Normal sinus rhythm, rate 60, QTc 452 02/04/22: Atrial fibrillation, rate 104 11/19/2021: Normal sinus rhythm, rate 72, left axis deviation 08/20/2021: Atrial flutter with variable conduction, rate 87 05/20/21: Normal sinus rhythm, rate 61, left axis deviation, nonspecific T wave  flattening, Q waves in leads III, aVF 03/14/21:NSR, ST depression<1 mm in inferior leads and V4-6, rate 61  Recent Labs: 06/30/2023: Magnesium  2.0 11/11/2023: ALT 5; BUN 18; Creatinine, Ser 1.43; Hemoglobin 11.9; Platelets 186.0; Potassium 4.2; Sodium 135  Recent Lipid Panel    Component Value Date/Time   CHOL 125 10/08/2023 1428   TRIG 72 10/08/2023 1428   HDL 46 10/08/2023 1428   CHOLHDL 2.7 10/08/2023 1428   LDLCALC 64 10/08/2023 1428    Physical Exam:    VS:  BP 120/60 (BP Location: Left Arm, Patient Position: Sitting, Cuff Size: Normal)   Pulse 76   Ht 5' 8 (1.727 m)   Wt 170 lb (77.1 kg)   SpO2 100%   BMI 25.85 kg/m     Wt Readings from Last 3 Encounters:  11/12/23 170 lb (77.1 kg)  10/08/23 176 lb (79.8 kg)  08/11/23 176 lb 9.6 oz (80.1 kg)     GEN:  in no acute distress HEENT: Normal NECK: No JVD; No carotid bruits CARDIAC: RRR, no murmurs, rubs, gallops RESPIRATORY:  Expiratory wheezing ABDOMEN: Soft, non-tender, non-distended MUSCULOSKELETAL: no edema; No deformity  SKIN: Warm and dry NEUROLOGIC:  Alert and oriented x 3 PSYCHIATRIC:  Normal affect   ASSESSMENT:    1. Persistent atrial fibrillation (HCC)   2. Heart failure with recovered ejection fraction (HFrecEF) (HCC)   3. Coronary artery disease involving native coronary artery of native heart without angina pectoris   4. Tobacco use   5. Alcohol use   6. Essential hypertension   7. PAD (peripheral artery disease) (HCC)       PLAN:    Persistent atrial fibrillation/flutter: CHA2DS2-VASc score 4.  Underwent successful cardioversion on 01/20/2021, but was back in A. fib at follow-up appointment on 01/24/2021.  Zio patch x3 days on 02/10/2021 showed 100% percent A. fib burden with average rate 95 bpm.   -Continue  Xarelto  20 mg daily -Continue Toprol -XL 100 mg twice daily -Given possible tachycardia induced cardiomyopathy, referred to EP to consider ablation versus antiarrhythmic.  Seen by Dr Cindie.   Admitted 02/2022 for Tikosyn  loading.  Underwent successful cardioversion 07/12/2023 but had quick return of atrial flutter; EP recommended rate control only, Tikosyn  discontinued  Heart failure with recovered EF: EF 30 to 35% on echocardiogram during admission with acute pancreatitis in September 2022.  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  Echocardiogram 05/21/2021 showed EF 55 to 60% -Continue Lasix  20 mg daily with 40 mg on MWF.  Stable labs on 9/4 -Continue Toprol -XL 100 mg BID -Continue losartan  50 mg daily -Continue Jardiance  10 mg daily -Update echocardiogram  CAD: He was admitted 11/2021 after being found unresponsive with significant hypoglycemia and hypotension.  EKG with aVR elevation and diffuse ST depressions, suggesting global ischemia.  EKG normalized with correction of his hypoglycemia.  He was seen by cardiology and outpatient coronary CT was recommended.  He denies any chest pain but having dyspnea on exertion -Recommend coronary CTA to evaluate for obstructive CAD.  Coronary CTA was attempted but he was in A-fib on presentation and was canceled.  Stress PET on 10/07/2022 showed small reversible perfusion defect in apical to mid inferior wall, mildly abnormal myocardial blood flow reserve (1.78), EF reported to drop from 81% at rest to 30% at stress (processing images I got 60% at rest and improved to 64% at stress), overall low risk study -LDL 64 on 10/08/23  Alcohol abuse: He reports he stopped drinking after starting Tikosyn .  Since he discontinued Tikosyn  he has started having 2-3 drinks per week, recommend cessation  Tobacco use: Patient counseled on the risk of tobacco use and cessation strongly encouraged.  Hypertension: Continue Toprol -XL, losartan .  Appears controlled  T2DM: On insulin .  A1c 5.8% on 10/2023  PAD: ABIs 2019 are severely reduced (right 0.52, left 0.48).  Lower extremity duplex 04/2014 showed bilateral SFA occlusions.  ABIs 02/2022 were 0.36 on right, 0.45  on left.  Follows with vascular surgery   RTC in 4 months  Medication Adjustments/Labs and Tests Ordered: Current medicines are reviewed at length with the patient today.  Concerns regarding medicines are outlined above.  Orders Placed This Encounter  Procedures   EKG 12-Lead   ECHOCARDIOGRAM COMPLETE    No orders of the defined types were placed in this encounter.   Patient Instructions  Medication Instructions:  Continue current medications *If you need a refill on your cardiac medications before your next appointment, please call your pharmacy*  Lab Work: none If you have labs (blood work) drawn today and your tests are completely normal, you will receive your results only by: MyChart Message (if you have MyChart) OR A paper copy in the mail If you have any lab test that is abnormal or we need to change your treatment, we will call you to review the results.  Testing/Procedures: echo  Your physician has requested that you have an echocardiogram. Echocardiography is a painless test that uses sound waves to create images of your heart. It provides your doctor with information about the size and shape of your heart and how well your heart's chambers and valves are working. This procedure takes approximately one hour. There are no restrictions for this procedure. Please do NOT wear cologne, perfume, aftershave, or lotions (deodorant is allowed). Please arrive 15 minutes prior to your appointment time.  Please note: We ask at that you not bring children with you during ultrasound (echo/  vascular) testing. Due to room size and safety concerns, children are not allowed in the ultrasound rooms during exams. Our front office staff cannot provide observation of children in our lobby area while testing is being conducted. An adult accompanying a patient to their appointment will only be allowed in the ultrasound room at the discretion of the ultrasound technician under special  circumstances. We apologize for any inconvenience.  Follow-Up: At [facility], you and your health needs are our priority.  As part of our continuing mission to provide you with exceptional heart care, our providers are all part of one team.  This team includes your primary Cardiologist (physician) and Advanced Practice Providers or APPs (Physician Assistants and Nurse Practitioners) who all work together to provide you with the care you need, when you need it.  Your next appointment:   4 months  Provider:   Dr. Kate   We recommend signing up for the patient portal called MyChart.  Sign up information is provided on this After Visit Summary.  MyChart is used to connect with patients for Virtual Visits (Telemedicine).  Patients are able to view lab/test results, encounter notes, upcoming appointments, etc.  Non-urgent messages can be sent to your provider as well.   To learn more about what you can do with MyChart, go to [email or web address].   Other Instructions none       Signed, Lonni LITTIE Kate, MD  11/12/2023 5:05 PM    [facility]

## 2023-11-11 NOTE — Progress Notes (Signed)
 11/11/2023 Griffen Frayne [id number] October 13, 1955  Referring provider: Gladis Mustard, * Primary GI doctor: Dr. Abran  ASSESSMENT AND PLAN:  Iron deficiency anemia 10/08/2023  HGB 12.0 MCV 101 Platelets 287 05/04/2023 Iron 137 Ferritin 28.3 B12 375 Recent Labs    03/16/23 1436 05/04/23 1558 06/30/23 1346 07/09/23 1348 10/08/23 1428  HGB 10.9* 10.4* 9.8* 10.1* 12.0*  06/16/2023 colonoscopy for positive Cologuard and IDA, excellent prep unremarkable no specimens collected 06/16/23 EGD for IDA and GERD showed esophagitis and esophageal stricture, sliding HH, duodenum deformity consistent with history of ulcer disease no active ulceration.  Pathology negative for intestinal metaplasia or dysplasia of esophagus -Likely secondary to anemia of chronic disease/CKD -Recheck iron/ferritin  GERD with decreased appetite, AB discomfort and early satiety. He is on narcotic and DM 1 since age 30 12/02/2020 CTAP W inflammation and stranding seen around the pancreatic head and proximal duodenum, pancreatitis versus duodenitis RUQ US  07/18/2021 gallbladder sludge no evidence of cholelithiasis or acute cholecystitis 06/16/23 EGD for IDA and GERD show GERD with esophagitis and esophageal stricture, sliding HH, duodenum deformity consistent with history of ulcer disease no active ulceration.  Pathology negative for intestinal metaplasia or dysplasia - consider GES, declines at this time, given gastroparesis diet, high suspision with risk factors of narcotics and longer term DM - check CT abdomen pelvis with weight loss and decreased appetitie - consider medications to help with appetite such as remeron/megace, will send PCP note - if CT if negative, possible weight loss/appetite is from COPD and chronic conditions/medications - continue prilosec once daily  Chronic combined systolic and diastolic heart failure 05/21/2021 EF 55 to 60%, grade 2 diastolic dysfunction, no aortic stenosis  A-fib  on  Xarelto   COPD with hypoxia and left upper lobe nodule CT chest 10/19/2023 spiculated medial left upper lobe 1.2 x 1.2 cm lesion suspicious malignancy but unchanged, no lymphadenopathy or metastatic disease On oxygen  Follows with Dr. Shelah Possibly contributing to appetite/weight loss  Type 1 diabetes with CKD Last A1c in August 5 0.6 very well-controlled Possibly contributing to appetite    Patient Care Team: Gladis Mustard, FNP as PCP - General (Family Medicine) Cindie Ole DASEN, MD as PCP - Electrophysiology (Cardiology) Kate Lonni CROME, MD as PCP - Cardiology (Cardiology)  HISTORY OF PRESENT ILLNESS: 68 y.o. male with a past medical history of bladder cancer, chronic oxygen  use, COPD, GERD, chronic combined systolic and diastolic CHF, A-fib on Xarelto  (05/21/2021 echo with LVEF 55-60%) and others listed below presents for evaluation of weight loss.   Last seen in the office 05/04/2023 by Delon Failing, PA for positive Cologuard and found to have IDA.  Discussed the use of AI scribe software for clinical note transcription with the patient, who gave verbal consent to proceed.  History of Present Illness   Thomas Sandoval. is a 68 year old male with diabetes and esophageal stricture who presents with loss of appetite and gastrointestinal symptoms.  He underwent an endoscopy and colonoscopy in April 2025. The colonoscopy results were normal, but the endoscopy revealed inflammation and a benign-appearing stricture in the esophagus. Biopsies taken during the procedure were normal.  He experiences a significant loss of appetite, stating that he has to 'force himself to eat.' He feels full quickly and has difficulty managing blood sugar levels due to irregular eating patterns. No nausea or abdominal pain unless constipated. He has been on omeprazole  after experiencing diarrhea with pantoprazole , but still reports some stomach upset.  He has  a history of diabetes  since age 68 and is insulin -dependent. He mentions that his son also developed diabetes around the same age. He reports feeling full quickly, loss of appetite, and inconsistent blood sugar levels.  He has been on hydrocodone  for several years for degenerative disc disease. He reports coughing and wheezing, which he attributes to breathing problems and possibly pollen. No fever or chills but notes pain when coughing.  He mentions a history of pneumonia two to three years ago and has experienced weight fluctuations, with a recent trend of weight loss. He is trying to manage his weight, which he feels is putting more strain on him.  In 2023, he had sludge in his gallbladder but no inflammation. He does not report a family history of gallbladder issues.        He  reports that he has been smoking cigarettes. He has a 15 pack-year smoking history. He quit smokeless tobacco use about 45 years ago. He reports that he does not currently use alcohol. He reports that he does not use drugs.  RELEVANT GI HISTORY, IMAGING AND LABS: Results   DIAGNOSTIC Colonoscopy: Normal findings (06/2023) Endoscopy: Esophageal inflammation and benign stricture (06/2023)  PATHOLOGY Esophageal biopsy: Normal (06/2023)      CBC    Component Value Date/Time   WBC 7.2 10/08/2023 1428   WBC 7.3 06/30/2023 1346   RBC 3.70 (L) 10/08/2023 1428   RBC 3.17 (L) 06/30/2023 1346   HGB 12.0 (L) 10/08/2023 1428   HCT 37.4 (L) 10/08/2023 1428   PLT 287 10/08/2023 1428   MCV 101 (H) 10/08/2023 1428   MCH 32.4 10/08/2023 1428   MCH 30.9 06/30/2023 1346   MCHC 32.1 10/08/2023 1428   MCHC 30.2 06/30/2023 1346   RDW 13.2 10/08/2023 1428   LYMPHSABS 1.5 10/08/2023 1428   MONOABS 0.6 05/04/2023 1558   EOSABS 0.3 10/08/2023 1428   BASOSABS 0.1 10/08/2023 1428   Recent Labs    03/16/23 1436 05/04/23 1558 06/30/23 1346 07/09/23 1348 10/08/23 1428  HGB 10.9* 10.4* 9.8* 10.1* 12.0*    CMP     Component Value  Date/Time   NA 140 10/08/2023 1428   K 4.6 10/08/2023 1428   CL 97 10/08/2023 1428   CO2 27 10/08/2023 1428   GLUCOSE 66 (L) 10/08/2023 1428   GLUCOSE 77 06/30/2023 1346   BUN 16 10/08/2023 1428   CREATININE 1.34 (H) 10/08/2023 1428   CREATININE 0.80 12/07/2014 1643   CALCIUM 10.4 (H) 10/08/2023 1428   PROT 6.9 10/08/2023 1428   ALBUMIN 4.4 10/08/2023 1428   AST 20 10/08/2023 1428   ALT 5 10/08/2023 1428   ALKPHOS 75 10/08/2023 1428   BILITOT 0.5 10/08/2023 1428   GFRNONAA >60 06/30/2023 1346   GFRAA 72 05/01/2020 1507      Latest Ref Rng & Units 10/08/2023    2:28 PM 05/04/2023    3:58 PM 03/16/2023    2:36 PM  Hepatic Function  Total Protein 6.0 - 8.5 g/dL 6.9  6.7  6.6   Albumin 3.9 - 4.9 g/dL 4.4  3.8  4.0   AST 0 - 40 IU/L 20  13  15    ALT 0 - 44 IU/L 5  5  8    Alk Phosphatase 44 - 121 IU/L 75  52  69   Total Bilirubin 0.0 - 1.2 mg/dL 0.5  0.4  0.3       Current Medications:   Current Outpatient Medications (Endocrine & Metabolic):  empagliflozin  (JARDIANCE ) 10 MG TABS tablet, Take 1 tablet (10 mg total) by mouth daily.   insulin  glargine (LANTUS ) 100 UNIT/ML injection, INJECT 0.4 MLS (40 UNITS TOTAL) INTO THE SKIN IN THE MORNING.   insulin  lispro (HUMALOG ) 100 UNIT/ML injection, PER SLIDING SCALE: 190 - 200 = 2 UNITS. 200-300= 4u 300 AND ABOVE = 7 UNITS.  Current Outpatient Medications (Cardiovascular):    furosemide  (LASIX ) 40 MG tablet, Take 0.5-1 tablets (20-40 mg total) by mouth See admin instructions. 20 mg Sun Tues Thurs Sat, 40 mg Mon Wed Fri   losartan  (COZAAR ) 50 MG tablet, Take 1 tablet (50 mg total) by mouth daily.   metoprolol  succinate (TOPROL -XL) 100 MG 24 hr tablet, Take 1 tablet (100 mg total) by mouth 2 (two) times daily. Take with or immediately following a meal.  Current Outpatient Medications (Respiratory):    Fluticasone -Umeclidin-Vilant (TRELEGY ELLIPTA ) 100-62.5-25 MCG/ACT AEPB, INHALE 1 PUFF INTO THE LUNGS daily   ipratropium-albuterol   (DUONEB) 0.5-2.5 (3) MG/3ML SOLN, INHALE CONTENTS OF 1 VIAL VIA NEBULIZER EVERY 4 HOURS AS NEEDED (Patient taking differently: Inhale 3 mLs into the lungs every 4 (four) hours as needed. Uses on a daily basis at most every 4 to 6 hours)   levalbuterol  (XOPENEX ) 0.31 MG/3ML nebulizer solution, INHALE 3 MLS (0.31 MG TOTAL) BY NEBULIZATION EVERY 4 (FOUR) HOURS AS NEEDED FOR WHEEZING. (Patient taking differently: Take 1 ampule by nebulization 2 (two) times daily.)  Current Outpatient Medications (Analgesics):    HYDROcodone -acetaminophen  (NORCO/VICODIN) 5-325 MG tablet, Take 1 tablet by mouth every 6 (six) hours as needed for moderate pain (pain score 4-6) or severe pain (pain score 7-10).  Current Outpatient Medications (Hematological):    XARELTO  20 MG TABS tablet, TAKE 1 TABLET BY MOUTH DAILY  WITH SUPPER  Current Outpatient Medications (Other):    Blood Glucose Monitoring Suppl DEVI, 1 each by Does not apply route in the morning, at noon, and at bedtime. May substitute to any manufacturer covered by patient's insurance.   glucose blood (ACCU-CHEK GUIDE TEST) test strip, Test BS in the morning, at noon and at bedtime Dx E10.649   Insulin  Syringe-Needle U-100 (B-D INS SYR ULTRAFINE 1CC/30G) 30G X 1/2 1 ML MISC, Use 4 times a day with insulin  Dx E11.9   Insulin  Syringes, Disposable, U-100 1 ML MISC, Use 4 times a day for insulin  injection Dx E11.9   naloxone  (NARCAN ) nasal spray 4 mg/0.1 mL, Place 1 spray into the nose as needed (For opiate overdose).   omeprazole  (PRILOSEC) 40 MG capsule, Take 1 capsule (40 mg total) by mouth daily.   OVER THE COUNTER MEDICATION, Take 1-2 tablets by mouth See admin instructions. Super Beets gummies- Chew 1-2 gummies by mouth every day   OXYGEN , Inhale 3 L/min into the lungs continuous.   potassium chloride  (KLOR-CON ) 10 MEQ tablet, Take 1 tablet (10 mEq total) by mouth daily.   Accu-Chek Softclix Lancets lancets, Test BS in the morning, at noon and at bedtime Dx  E10.649  Medical History:  Past Medical History:  Diagnosis Date   Arthritis    Bladder cancer (HCC)    Chronic back pain    Chronic combined systolic and diastolic CHF (congestive heart failure) (HCC) 07/16/2021   COPD (chronic obstructive pulmonary disease) (HCC)    COPD with chronic bronchitis and emphysema (HCC)    DDD (degenerative disc disease)    Diabetic retinopathy of both eyes (HCC)    Essential hypertension    GERD (gastroesophageal reflux disease)    History  of atrial flutter 02/07/2011   Converted to NSR with Cardizem    History of chronic bronchitis    History of hemolytic anemia 02/07/2011   secondary to Avelox    HOH (hard of hearing)    HOH (hard of hearing)    no eardrum and nerve damage on R, also HOH on L   Mild renal insufficiency 08/25/2017   Mitral valve prolapse    a. 2D Echo 11/27/14: EF 55-60%; images were inadequate for LV wall motion assessment, + mild late systolic mitral valve prolapse involving the anterior leaflet.   PAD (peripheral artery disease) (HCC) 04/09/2014   Dr Serene; bilateral SFA occlusion, R mid, L distal   PAF (paroxysmal atrial fibrillation) (HCC)    a. Dx 11/2014 during admission for perf ulcer.   Perforated ulcer (HCC)    a. 11/2014 s/p surgery.   Productive cough    Smokers' cough (HCC)    Type 1 diabetes mellitus (HCC) 03/10/1975   Allergies:  Allergies  Allergen Reactions   Avelox  [Moxifloxacin  Hcl In Nacl] Other (See Comments)    Hemolysis  In 2012   Azithromycin  Other (See Comments) and Nausea And Vomiting    Severe stomach cramps; told to list as an allergy by dr. Orlinda ago   Bactrim [Sulfamethoxazole -Trimethoprim] Diarrhea and Nausea And Vomiting     Surgical History:  He  has a past surgical history that includes Esophagogastroduodenoscopy (N/A, 02/06/2013); transthoracic echocardiogram (02-17-2011); Tonsillectomy (as child); Tympanic membrane repair (as child); Transurethral resection of bladder tumor with gyrus  (turbt-gyrus) (N/A, 06/12/2013); Cystoscopy with ureteroscopy (Right, 08/14/2013); laparoscopy (N/A, 11/25/2014); Cardioversion (N/A, 01/20/2021); Colonoscopy with propofol  (N/A, 06/16/2023); Esophagogastroduodenoscopy (egd) with propofol  (N/A, 06/16/2023); Biopsy of skin subcutaneous tissue and/or mucous membrane (06/16/2023); and CARDIOVERSION (N/A, 07/12/2023). Family History:  His family history includes Breast cancer in his mother; Cancer in his mother; Diabetes in his son; Heart attack in his father; Heart disease in his father; Rheumatic fever in his father.  REVIEW OF SYSTEMS  : All other systems reviewed and negative except where noted in the History of Present Illness.  PHYSICAL EXAM: BP 110/60   Pulse 77  Physical Exam   GENERAL APPEARANCE: Elderly, chronically ill appealing male, in no apparent distress. HEENT: No cervical lymphadenopathy, unremarkable thyroid, sclerae anicteric, conjunctiva pink. RESPIRATORY: Wheezing bilaterally with decreased breath sounds 2 L continuous nasal cannula CARDIO: Regular rate and rhythm with no murmurs, rubs, or gallops ABDOMEN: Soft, non-distended, active bowel sounds in all four quadrants, no tenderness to palpation, no rebound, no mass appreciated. RECTAL: Not evaluated MUSCULOSKELETAL: Not evaluated well, patient in wheelchair gait not evaluated SKIN: Dry, intact without rashes or lesions. No jaundice. NEURO: Alert, oriented, no focal deficits. PSYCH: Cooperative, normal mood and affect.      Alan JONELLE Coombs, PA-C 3:18 PM

## 2023-11-11 NOTE — Patient Instructions (Signed)
 _______________________________________________________  If your blood pressure at your visit was 140/90 or greater, please contact your primary care physician to follow up on this.  _______________________________________________________  If you are age 66 or older, your body mass index should be between 23-30. Your There is no height or weight on file to calculate BMI. If this is out of the aforementioned range listed, please consider follow up with your Primary Care Provider.  If you are age 101 or younger, your body mass index should be between 19-25. Your There is no height or weight on file to calculate BMI. If this is out of the aformentioned range listed, please consider follow up with your Primary Care Provider.   ________________________________________________________  The [facility] providers would like to encourage you to use MYCHART to communicate with providers for non-urgent requests or questions.  Due to long hold times on the telephone, sending your provider a message by [facility] may be a faster and more efficient way to get a response.  Please allow 48 business hours for a response.  Please remember that this is for non-urgent requests.  _______________________________________________________  [facility] is using a team-based approach to care.  Your team is made up of your doctor and two to three APPS. Our APPS (Nurse Practitioners and Physician Assistants) work with your physician to ensure care continuity for you. They are fully qualified to address your health concerns and develop a treatment plan. They communicate directly with your gastroenterologist to care for you. Seeing the Advanced Practice Practitioners on your physician's team can help you by facilitating care more promptly, often allowing for earlier appointments, access to diagnostic testing, procedures, and other specialty referrals.   You have been scheduled for a CT scan of the abdomen and pelvis at  [facility], 1st floor Radiology. You are scheduled on ______ at ---. You should arrive 15 minutes prior to your appointment time for registration.  We are giving you 2 bottles of contrast today that you will need to drink before arriving for the exam. The solution may taste better if refrigerated so put them in the refrigerator when you get home, but do NOT add ice or any other liquid to this solution as that would dilute it. Shake well before drinking.   Please follow the written instructions below on the day of your exam:   1) Do not eat anything after _--- (4 hours prior to your test)   2) Drink 1 bottle of contrast @ -_- (2 hours prior to your exam)  Remember to shake well before drinking and do NOT pour over ice.     Drink 1 bottle of contrast @ --- (1 hour prior to your exam)   You may take any medications as prescribed with a small amount of water , if necessary. If you take any of the following medications: METFORMIN, GLUCOPHAGE, GLUCOVANCE, AVANDAMET, RIOMET, FORTAMET, ACTOPLUS MET, JANUMET, GLUMETZA or METAGLIP, you MAY be asked to HOLD this medication 48 hours AFTER the exam.   The purpose of you drinking the oral contrast is to aid in the visualization of your intestinal tract. The contrast solution may cause some diarrhea. Depending on your individual set of symptoms, you may also receive an intravenous injection of x-ray contrast/dye. Plan on being at [facility] for 45 minutes or longer, depending on the type of exam you are having performed.   If you have any questions regarding your exam or if you need to reschedule, you may call [facility]  at [phone] between the hours of 8:00 am and 5:00 pm, Monday-Friday.   Due to recent changes in healthcare laws, you may see the results of your imaging and laboratory studies on MyChart before your provider has had a chance to review them.  We understand that in some cases there may be results that are confusing or  concerning to you. Not all laboratory results come back in the same time frame and the provider may be waiting for multiple results in order to interpret others.  Please give us  48 hours in order for your provider to thoroughly review all the results before contacting the office for clarification of your results.       Gastroparesis Gastroparesis is a condition in which food takes longer than normal to empty from the stomach.  This condition is also known as delayed gastric emptying. It is usually a long-term (chronic) condition.  What are the signs or symptoms? Symptoms of this condition include: Feeling full after eating very little or a loss of appetite. Nausea, vomiting, or heartburn. Bloating of your abdomen. Inconsistent blood sugar (glucose) levels on blood tests. Unexplained weight loss. Acid from the stomach coming up into the esophagus (gastroesophageal reflux). Sudden tightening (spasm) of the stomach, which can be painful. Symptoms may come and go. Some people may not notice any symptoms.  What increases the risk? You are more likely to develop this condition if: You have certain disorders or diseases. These may include: An endocrine disorder. An eating disorder. Amyloidosis. Scleroderma. Parkinson's disease. Multiple sclerosis. Cancer or infection of the stomach or the vagus nerve. You have had surgery on your stomach or vagus nerve. You take certain medicines. You are male.  Things you can do: Please do small frequent meals like 4-6 meals a day.  Eat and drink liquids at separate times.  Avoid high fiber foods, cook your vegetables, avoid high fat food.  Suggest spreading protein throughout the day (greek yogurt, glucerna, soft meat, milk, eggs) Choose soft foods that you can mash with a fork When you are more symptomatic, change to pureed foods foods and liquids.  Consider reading Living well with Gastroparesis by [facility] Check out this link  to a diet online [email or web address]

## 2023-11-12 ENCOUNTER — Encounter: Payer: Self-pay | Admitting: Cardiology

## 2023-11-12 ENCOUNTER — Ambulatory Visit: Attending: Cardiology | Admitting: Cardiology

## 2023-11-12 ENCOUNTER — Ambulatory Visit: Payer: Self-pay | Admitting: Physician Assistant

## 2023-11-12 VITALS — BP 120/60 | HR 76 | Ht 68.0 in | Wt 170.0 lb

## 2023-11-12 DIAGNOSIS — I10 Essential (primary) hypertension: Secondary | ICD-10-CM

## 2023-11-12 DIAGNOSIS — I2510 Atherosclerotic heart disease of native coronary artery without angina pectoris: Secondary | ICD-10-CM

## 2023-11-12 DIAGNOSIS — I4819 Other persistent atrial fibrillation: Secondary | ICD-10-CM

## 2023-11-12 DIAGNOSIS — I739 Peripheral vascular disease, unspecified: Secondary | ICD-10-CM | POA: Diagnosis not present

## 2023-11-12 DIAGNOSIS — I5032 Chronic diastolic (congestive) heart failure: Secondary | ICD-10-CM | POA: Diagnosis not present

## 2023-11-12 DIAGNOSIS — F1090 Alcohol use, unspecified, uncomplicated: Secondary | ICD-10-CM

## 2023-11-12 DIAGNOSIS — Z720 Tobacco use: Secondary | ICD-10-CM | POA: Diagnosis not present

## 2023-11-12 NOTE — Patient Instructions (Addendum)
 Medication Instructions:  Continue current medications *If you need a refill on your cardiac medications before your next appointment, please call your pharmacy*  Lab Work: none If you have labs (blood work) drawn today and your tests are completely normal, you will receive your results only by: MyChart Message (if you have MyChart) OR A paper copy in the mail If you have any lab test that is abnormal or we need to change your treatment, we will call you to review the results.  Testing/Procedures: echo  Your physician has requested that you have an echocardiogram. Echocardiography is a painless test that uses sound waves to create images of your heart. It provides your doctor with information about the size and shape of your heart and how well your heart's chambers and valves are working. This procedure takes approximately one hour. There are no restrictions for this procedure. Please do NOT wear cologne, perfume, aftershave, or lotions (deodorant is allowed). Please arrive 15 minutes prior to your appointment time.  Please note: We ask at that you not bring children with you during ultrasound (echo/ vascular) testing. Due to room size and safety concerns, children are not allowed in the ultrasound rooms during exams. Our front office staff cannot provide observation of children in our lobby area while testing is being conducted. An adult accompanying a patient to their appointment will only be allowed in the ultrasound room at the discretion of the ultrasound technician under special circumstances. We apologize for any inconvenience.  Follow-Up: At [facility], you and your health needs are our priority.  As part of our continuing mission to provide you with exceptional heart care, our providers are all part of one team.  This team includes your primary Cardiologist (physician) and Advanced Practice Providers or APPs (Physician Assistants and Nurse Practitioners) who all work together  to provide you with the care you need, when you need it.  Your next appointment:   4 months  Provider:   Dr. Kate   We recommend signing up for the patient portal called MyChart.  Sign up information is provided on this After Visit Summary.  MyChart is used to connect with patients for Virtual Visits (Telemedicine).  Patients are able to view lab/test results, encounter notes, upcoming appointments, etc.  Non-urgent messages can be sent to your provider as well.   To learn more about what you can do with MyChart, go to [email or web address].   Other Instructions none

## 2023-11-15 ENCOUNTER — Ambulatory Visit: Admitting: Physical Medicine and Rehabilitation

## 2023-11-16 NOTE — Progress Notes (Signed)
 Noted

## 2023-11-18 ENCOUNTER — Other Ambulatory Visit (HOSPITAL_COMMUNITY)

## 2023-11-19 ENCOUNTER — Ambulatory Visit (HOSPITAL_COMMUNITY)
Admission: RE | Admit: 2023-11-19 | Discharge: 2023-11-19 | Disposition: A | Discharge status: Home / Self Care | Source: Ambulatory Visit | Attending: Physician Assistant | Admitting: Physician Assistant

## 2023-11-19 DIAGNOSIS — R630 Anorexia: Secondary | ICD-10-CM

## 2023-11-19 DIAGNOSIS — R634 Abnormal weight loss: Secondary | ICD-10-CM

## 2023-11-20 DIAGNOSIS — J9601 Acute respiratory failure with hypoxia: Secondary | ICD-10-CM | POA: Diagnosis not present

## 2023-11-22 ENCOUNTER — Telehealth: Payer: Self-pay | Admitting: Physician Assistant

## 2023-11-22 DIAGNOSIS — R634 Abnormal weight loss: Secondary | ICD-10-CM

## 2023-11-22 DIAGNOSIS — R630 Anorexia: Secondary | ICD-10-CM

## 2023-11-22 NOTE — Telephone Encounter (Signed)
 Returned call to [place]. [name] requested that order be placed at [facility] on [address]. [name] knows to expect a call from [facility] to set up patient's CT appt. [name] verbalized understanding and had no concerns at the end of the call.

## 2023-11-22 NOTE — Addendum Note (Signed)
 Addended by: Sadler Teschner N on: 11/22/2023 04:30 PM   Modules accepted: Orders

## 2023-11-22 NOTE — Telephone Encounter (Signed)
 Inbound call from spouse of patient stating they were unable to get CT done on 11/19/23 due to [facility] taking too long and having the patient wait, patient started experiencing back pain and left before getting it done would like to know if he can be scheduled again back at [facility]  Requesting a call back  Please advise  Thank you

## 2023-11-24 ENCOUNTER — Encounter: Attending: Registered Nurse | Admitting: Physical Medicine and Rehabilitation

## 2023-11-24 VITALS — BP 125/77 | HR 84 | Ht 68.0 in | Wt 170.0 lb

## 2023-11-24 DIAGNOSIS — Z7409 Other reduced mobility: Secondary | ICD-10-CM | POA: Insufficient documentation

## 2023-11-24 DIAGNOSIS — G8929 Other chronic pain: Secondary | ICD-10-CM | POA: Insufficient documentation

## 2023-11-24 DIAGNOSIS — M533 Sacrococcygeal disorders, not elsewhere classified: Secondary | ICD-10-CM | POA: Insufficient documentation

## 2023-11-24 DIAGNOSIS — G894 Chronic pain syndrome: Secondary | ICD-10-CM | POA: Insufficient documentation

## 2023-11-24 DIAGNOSIS — M545 Low back pain, unspecified: Secondary | ICD-10-CM | POA: Insufficient documentation

## 2023-11-24 DIAGNOSIS — Z5181 Encounter for therapeutic drug level monitoring: Secondary | ICD-10-CM | POA: Insufficient documentation

## 2023-11-24 DIAGNOSIS — Z79891 Long term (current) use of opiate analgesic: Secondary | ICD-10-CM | POA: Insufficient documentation

## 2023-11-24 NOTE — Progress Notes (Signed)
 Subjective:    Patient ID: Thomas Sandoval., male    DOB: Jan 22, 1956, 68 y.o.   MRN: [id number]  HPI  Thomas Sandoval. is a 68 y.o. year old male  who  has a past medical history of Arthritis, Bladder cancer (HCC), Chronic back pain, Chronic combined systolic and diastolic CHF (congestive heart failure) (HCC) (07/16/2021), COPD (chronic obstructive pulmonary disease) (HCC), COPD with chronic bronchitis and emphysema (HCC), DDD (degenerative disc disease), Diabetic retinopathy of both eyes (HCC), Essential hypertension, GERD (gastroesophageal reflux disease), History of atrial flutter (02/07/2011), History of chronic bronchitis, History of hemolytic anemia (02/07/2011), HOH (hard of hearing), HOH (hard of hearing), Mild renal insufficiency (08/25/2017), Mitral valve prolapse, PAD (peripheral artery disease) (HCC) (04/09/2014), PAF (paroxysmal atrial fibrillation) (HCC), Perforated ulcer (HCC), Productive cough, Smokers' cough (HCC), and Type 1 diabetes mellitus (HCC) (03/10/1975).   They are presenting to PM&R clinic for follow up related to chronic pain .   Interval Hx:  - Therapies: Minimal activity - he can't stand long enough to brush his teeth since the back shot. He can't do much. Pain in his back is worst when he gets up in the morning. When he lays down, he has trouble positioning due to pain - sleeps about 4 hours at a time. He can get to his front porch only due to pain.    - Follow ups: Went to his PCP a month ago and got a cough; took a shot of vodka with robitussin which usually helps with the cough. He is on chronic antibiotics for when he gets coughs - he gets one a month.   There's something wrong with my stomach; he has lost his appetite and lost a little weight. He is pending an endoscopy.    - Falls:none  - DME: He has a flutter valve to break up cough but he doesn't need it most of the time.   - Medications: meidcation    Pain Inventory Average Pain 7 Pain  Right Now 6 My pain is sharp, burning, and dull  In the last 24 hours, has pain interfered with the following? General activity 5 Relation with others 0 Enjoyment of life 0 What TIME of day is your pain at its worst? morning , daytime, evening, and night Sleep (in general) Fair  Pain is worse with: walking, bending, standing, and some activites Pain improves with: rest and medication Relief from Meds: 5  Family History  Problem Relation Age of Onset   Breast cancer Mother    Cancer Mother        Breast   Rheumatic fever Father    Heart disease Father    Heart attack Father        Massive    Diabetes Son    Social History   Socioeconomic History   Marital status: Married    Spouse name: Not on file   Number of children: Not on file   Years of education: Not on file   Highest education level: Not on file  Occupational History   Occupation: Tobacco Farmer  Tobacco Use   Smoking status: Every Day    Current packs/day: 0.50    Average packs/day: 0.5 packs/day for 30.0 years (15.0 ttl pk-yrs)    Types: Cigarettes   Smokeless tobacco: Former    Quit date: 06/08/1978   Tobacco comments:    11/12/2023 Patient smokes a pack daily    1/2 pk 03/23/23 NM, CMA   Vaping Use  Vaping status: Never Used  Substance and Sexual Activity   Alcohol use: Not Currently    Comment: 5 quarts per week 12/26/21   Drug use: No   Sexual activity: Not on file  Other Topics Concern   Not on file  Social History Narrative   Lives in [place] with wife and 2 sons.    Social Drivers of Health   Financial Resource Strain: Low Risk  (04/12/2023)   Overall Financial Resource Strain (CARDIA)    Difficulty of Paying Living Expenses: Not very hard  Food Insecurity: Low Risk  (11/24/2022)   Received from [facility]   Hunger Vital Sign    Within the past 12 months, you worried that your food would run out before you got money to buy more: Never true    Within the past 12 months, the food you  bought just didn't last and you didn't have money to get more. : Never true  Transportation Needs: No Transportation Needs (11/24/2022)   Received from [facility]    In the past 12 months, has lack of reliable transportation kept you from medical appointments, meetings, work or from getting things needed for daily living? : No  Physical Activity: Inactive (04/12/2023)   Exercise Vital Sign    Days of Exercise per Week: 0 days    Minutes of Exercise per Session: 0 min  Stress: No Stress Concern Present (04/12/2023)   [employer] of Occupational Health - Occupational Stress Questionnaire    Feeling of Stress : Not at all  Social Connections: Socially Isolated (04/12/2023)   Social Connection and Isolation Panel    Frequency of Communication with Friends and Family: Once a week    Frequency of Social Gatherings with Friends and Family: Never    Attends Religious Services: Never    Database administrator or Organizations: No    Attends Engineer, structural: Never    Marital Status: Married   Past Surgical History:  Procedure Laterality Date   BIOPSY OF SKIN SUBCUTANEOUS TISSUE AND/OR MUCOUS MEMBRANE  06/16/2023   Procedure: BIOPSY, SKIN, SUBCUTANEOUS TISSUE, OR MUCOUS MEMBRANE;  Surgeon: Abran Norleen SAILOR, MD;  Location: [facility];  Service: Gastroenterology;;   CARDIOVERSION N/A 01/20/2021   Procedure: CARDIOVERSION;  Surgeon: Okey Vina GAILS, MD;  Location: [facility] END[facility]COPY;  Service: Cardiovascular;  Laterality: N/A;   CARDIOVERSION N/A 07/12/2023   Procedure: CARDIOVERSION;  Surgeon: Mona Vinie BROCKS, MD;  Location: [facility];  Service: Cardiovascular;  Laterality: N/A;   COLONOSCOPY WITH PROPOFOL  N/A 06/16/2023   Procedure: COLONOSCOPY WITH PROPOFOL ;  Surgeon: Abran Norleen SAILOR, MD;  Location: [facility];  Service: Gastroenterology;  Laterality: N/A;   CYSTOSCOPY WITH URETEROSCOPY Right 08/14/2013   Procedure: CYSTOSCOPY WITH URETEROSCOPY BLADDER BIOPSY ;   Surgeon: Oneil BROCKS Rafter, MD;  Location: [facility];  Service: Urology;  Laterality: Right;   ESOPHAGOGASTRODUODENOSCOPY N/A 02/06/2013   Procedure: ESOPHAGOGASTRODUODENOSCOPY (EGD);  Surgeon: Princella CHRISTELLA Nida, MD;  Location: [facility] END[facility]COPY;  Service: Endoscopy;  Laterality: N/A;   ESOPHAGOGASTRODUODENOSCOPY (EGD) WITH PROPOFOL  N/A 06/16/2023   Procedure: ESOPHAGOGASTRODUODENOSCOPY (EGD) WITH PROPOFOL ;  Surgeon: Abran Norleen SAILOR, MD;  Location: [facility];  Service: Gastroenterology;  Laterality: N/A;   LAPAROSCOPY N/A 11/25/2014   Procedure: LAPAROSCOPIC PRIMARY REPAIR OF PERFORATED PREPYLORIC ULCER WITH ARLYSS LISLE;  Surgeon: Camellia Blush, MD;  Location: [facility] OR;  Service: General;  Laterality: N/A;   TONSILLECTOMY  as child   TRANSTHORACIC ECHOCARDIOGRAM  02-17-2011  MODERATE LVH/  EF 65%   TRANSURETHRAL RESECTION OF BLADDER TUMOR WITH GYRUS (TURBT-GYRUS) N/A 06/12/2013   Procedure: TRANSURETHRAL RESECTION OF BLADDER TUMOR WITH GYRUS (TURBT-GYRUS);  Surgeon: Mark C Ottelin, MD;  Location: [facility];  Service: Urology;  Laterality: N/A;   TYMPANIC MEMBRANE REPAIR  as child   Past Surgical History:  Procedure Laterality Date   BIOPSY OF SKIN SUBCUTANEOUS TISSUE AND/OR MUCOUS MEMBRANE  06/16/2023   Procedure: BIOPSY, SKIN, SUBCUTANEOUS TISSUE, OR MUCOUS MEMBRANE;  Surgeon: Abran Norleen SAILOR, MD;  Location: [facility];  Service: Gastroenterology;;   CARDIOVERSION N/A 01/20/2021   Procedure: CARDIOVERSION;  Surgeon: Okey Vina GAILS, MD;  Location: [facility] ENDOSCOPY;  Service: Cardiovascular;  Laterality: N/A;   CARDIOVERSION N/A 07/12/2023   Procedure: CARDIOVERSION;  Surgeon: Mona Vinie BROCKS, MD;  Location: [facility];  Service: Cardiovascular;  Laterality: N/A;   COLONOSCOPY WITH PROPOFOL  N/A 06/16/2023   Procedure: COLONOSCOPY WITH PROPOFOL ;  Surgeon: Abran Norleen SAILOR, MD;  Location: [facility];  Service: Gastroenterology;  Laterality: N/A;   CYSTOSCOPY WITH URETEROSCOPY Right  08/14/2013   Procedure: CYSTOSCOPY WITH URETEROSCOPY BLADDER BIOPSY ;  Surgeon: Oneil BROCKS Rafter, MD;  Location: [facility];  Service: Urology;  Laterality: Right;   ESOPHAGOGASTRODUODENOSCOPY N/A 02/06/2013   Procedure: ESOPHAGOGASTRODUODENOSCOPY (EGD);  Surgeon: Princella CHRISTELLA Nida, MD;  Location: [facility] ENDOSCOPY;  Service: Endoscopy;  Laterality: N/A;   ESOPHAGOGASTRODUODENOSCOPY (EGD) WITH PROPOFOL  N/A 06/16/2023   Procedure: ESOPHAGOGASTRODUODENOSCOPY (EGD) WITH PROPOFOL ;  Surgeon: Abran Norleen SAILOR, MD;  Location: [facility];  Service: Gastroenterology;  Laterality: N/A;   LAPAROSCOPY N/A 11/25/2014   Procedure: LAPAROSCOPIC PRIMARY REPAIR OF PERFORATED PREPYLORIC ULCER WITH ARLYSS LISLE;  Surgeon: Camellia Blush, MD;  Location[facility];  Service: General;  Laterality: N/A;   TONSILLECTOMY  as child   TRANSTHORACIC ECHOCARDIOGRAM  02-17-2011   MODERATE LVH/  EF 65%   TRANSURETHRAL RESECTION OF BLADDER TUMOR WITH GYRUS (TURBT-GYRUS) N/A 06/12/2013   Procedure: TRANSURETHRAL RESECTION OF BLADDER TUMOR WITH GYRUS (TURBT-GYRUS);  Surgeon: Mark C Ottelin, MD;  Location: [facility];  Service: Urology;  Laterality: N/A;   TYMPANIC MEMBRANE REPAIR  as child   Past Medical History:  Diagnosis Date   Arthritis    Bladder cancer (HCC)    Chronic back pain    Chronic combined systolic and diastolic CHF (congestive heart failure) (HCC) 07/16/2021   COPD (chronic obstructive pulmonary disease) (HCC)    COPD with chronic bronchitis and emphysema (HCC)    DDD (degenerative disc disease)    Diabetic retinopathy of both eyes (HCC)    Essential hypertension    GERD (gastroesophageal reflux disease)    History of atrial flutter 02/07/2011   Converted to NSR with Cardizem    History of chronic bronchitis    History of hemolytic anemia 02/07/2011   secondary to Avelox    HOH (hard of hearing)    HOH (hard of hearing)    no eardrum and nerve damage on R, also HOH on L   Mild renal insufficiency  08/25/2017   Mitral valve prolapse    a. 2D Echo 11/27/14: EF 55-60%; images were inadequate for LV wall motion assessment, + mild late systolic mitral valve prolapse involving the anterior leaflet.   PAD (peripheral artery disease) (HCC) 04/09/2014   Dr Serene; bilateral SFA occlusion, R mid, L distal   PAF (paroxysmal atrial fibrillation) (HCC)    a. Dx 11/2014 during admission for perf ulcer.   Perforated ulcer (HCC)  a. 11/2014 s/p surgery.   Productive cough    Smokers' cough (HCC)    Type 1 diabetes mellitus (HCC) 03/10/1975   BP 125/77   Pulse 84   Ht 5' 8 (1.727 m)   Wt 170 lb (77.1 kg)   SpO2 99%   BMI 25.85 kg/m   Opioid Risk Score:   Fall Risk Score:  `1  Depression screen PHQ 2/9     10/08/2023    2:21 PM 08/11/2023    2:53 PM 05/12/2023    2:59 PM 02/03/2023    2:22 PM 02/02/2023    3:07 PM 01/12/2023    1:41 PM 12/28/2022    3:46 PM  Depression screen PHQ 2/9  Decreased Interest 0 0 0 0 0 0 0  Down, Depressed, Hopeless 0 0 0 0 0 0 0  PHQ - 2 Score 0 0 0 0 0 0 0  Altered sleeping     0  0  Tired, decreased energy     0  0  Change in appetite     0  0  Feeling bad or failure about yourself      0  0  Trouble concentrating     0  0  Moving slowly or fidgety/restless     0  0  Suicidal thoughts     0  0  PHQ-9 Score     0  0  Difficult doing work/chores     Not difficult at all  Not difficult at all     Review of Systems  Musculoskeletal:  Positive for back pain.  All other systems reviewed and are negative.      Objective:   Physical Exam  Constitution: Appropriate appearance for age. No apparent distress. Resp: No respiratory distress. No accessory muscle usage. on 2 L [place] Cardio: Well perfused appearance. No peripheral edema. Abdomen: Nondistended. Nontender.   Psych: Appropriate mood and affect. Neuro: AAOx4. No apparent cognitive deficits. +HOH   Neurologic Exam:   DTRs: Reflexes were 2+ throughout Sensory exam: revealed normal sensation in  all dermatomal regions in bilateral lower extremities Motor exam: strength 4+/5 throughout bilateral lower extremities--unchanged Coordination: Fine motor coordination was normal.   Gait: Unable to attempt today due to back pain    MSK: + Exquisite TTP bilateral low back lumbar paraspinals, quadratus + Pain with facet loading - slump  Physical exam unchanged from the above on reexamination 12/01/23        Assessment & Plan:   Odel Schmid. is a 68 y.o. year old male  who  has a past medical history of Arthritis, Bladder cancer (HCC), Chronic back pain, Chronic combined systolic and diastolic CHF (congestive heart failure) (HCC) (07/16/2021), COPD (chronic obstructive pulmonary disease) (HCC), COPD with chronic bronchitis and emphysema (HCC), DDD (degenerative disc disease), Diabetic retinopathy of both eyes (HCC), Essential hypertension, GERD (gastroesophageal reflux disease), History of atrial flutter (02/07/2011), History of chronic bronchitis, History of hemolytic anemia (02/07/2011), HOH (hard of hearing), HOH (hard of hearing), Mild renal insufficiency (08/25/2017), Mitral valve prolapse, PAD (peripheral artery disease) (04/09/2014), PAF (paroxysmal atrial fibrillation) (HCC), Perforated ulcer (HCC), Productive cough, Smokers' cough (HCC), and Type 1 diabetes mellitus (HCC) (03/10/1975).   They are presenting to PM&R clinic as a follow up fpr chronic pain management for bilateral low back pain without sciatica.   Chronic pain syndrome Encounter for long-term opiate analgesic use Encounter for therapeutic drug monitoring  Starting with next refill  we will increase Norco  from 5 to 10 mg tabs 4 times daily; please try to only take 1/2 tab unless a full tab is needed. For now, you can take 2 tabs up to 4 times daily for current symptoms and I will send the refill early accounting for this (you have 58 tabs - 1 week)  We discussed converting to long acting oxycodone  but patient  declined  We discussed starting a muscle relaxant but patient declined  Follow up in 3 months; call if any issues with current medication  Chronic bilateral low back pain without sciatica -     MR LUMBAR SPINE WO CONTRAST; Future  Declines further back injections/medial branch blocks due to increased pain post-procedure   Patient finally willing to consider surgical intervention for his pain  I am getting a new MRI of your back given persistent symptoms; depending on results, I will likely refer you to neurosurgery through [facility]  Mobility impaired  Continue to be as active as possible; very limited by pain and respiratory issues with COPD + recurrent pneumonia   Trialled and failed PT for his back

## 2023-11-24 NOTE — Patient Instructions (Addendum)
 Starting with next refill  we will increase Norco from 5 to 10 mg tabs 4 times daily; please try to only take 1/2 tab unless a full tab is needed. For now, you can take 2 tabs up to 4 times daily for current symptoms and I will send the refill early accounting for this (you have 58 tabs - 1 week)  We discussed converting to long acting oxycodone  but patient declined  We discussed starting a muscle relaxant but patient declined  I am getting an MRI of your back given persistent symptoms; depending on results, I will likely refer you to neurosurgery through [facility]  Follow up in 3 months; call if any issues with current medication

## 2023-11-26 ENCOUNTER — Ambulatory Visit: Payer: Self-pay | Admitting: Emergency Medicine

## 2023-11-26 NOTE — Telephone Encounter (Signed)
 FYI Only or Action Required?: FYI only for provider.  Patient is followed in Pulmonology for COPD, last seen on 03/23/2023 by Shelah Lamar RAMAN, MD.  Called Nurse Triage reporting Hemoptysis.  Symptoms began several weeks ago.  Interventions attempted: Nothing.  Symptoms are: unchanged.  Triage Disposition: See PCP When Office is Open (Within 3 Days)  Patient/caregiver understands and will follow disposition?: Yes     Copied from CRM [id number]. Topic: Clinical - Red Word Triage >> Nov 26, 2023 12:27 PM Thomas Sandoval wrote: Red Word that prompted transfer to Nurse Triage: Patient wife Thomas Sandoval called and stated the patient is coughing up blood, unsure if it is from his stomach or lungs.       Reason for Disposition  Cough has been present for > 3 weeks  Answer Assessment - Initial Assessment Questions 1. ONSET: When did the cough begin?      Intermittent for a while now 2. SEVERITY: How bad is the cough today? Did the blood appear after a coughing spell?      Mild  3. SPUTUM: Describe the color of your sputum (e.g., none, dry cough; clear, white, yellow, green)     Off white sputum  4. HEMOPTYSIS: How much blood? (e.g., flecks, streaks, tablespoons)     Less than a tablespoon  5. DIFFICULTY BREATHING: Are you having difficulty breathing? If Yes, ask: How bad is it? (e.g., mild, moderate, severe)      No 6. FEVER: Do you have a fever? If Yes, ask: What is your temperature, how was it measured, and when did it start?     No 7. CARDIAC HISTORY: Do you have any history of heart disease? (e.g., heart attack, congestive heart failure)      Yes 8. LUNG HISTORY: Do you have any history of lung disease?  (e.g., pulmonary embolus, asthma, emphysema)     COPD 9. PE RISK FACTORS: Do you have a history of blood clots? Note: Other risk factors include recent major surgery, recent prolonged travel, being bedridden.     No 10. OTHER SYMPTOMS: Do you have any other  symptoms? (e.g., runny nose, wheezing, chest pain)       No  Protocols used: Coughing Up Blood-A-AH

## 2023-11-29 ENCOUNTER — Ambulatory Visit
Admission: RE | Admit: 2023-11-29 | Discharge: 2023-11-29 | Disposition: A | Discharge status: Home / Self Care | Source: Ambulatory Visit | Attending: Physician Assistant | Admitting: Physician Assistant

## 2023-11-29 DIAGNOSIS — R630 Anorexia: Secondary | ICD-10-CM

## 2023-11-29 DIAGNOSIS — R109 Unspecified abdominal pain: Secondary | ICD-10-CM | POA: Diagnosis not present

## 2023-11-29 DIAGNOSIS — R634 Abnormal weight loss: Secondary | ICD-10-CM

## 2023-11-29 MED ORDER — IOPAMIDOL (ISOVUE-370) INJECTION 76%
80.0000 mL | Freq: Once | INTRAVENOUS | Status: AC | PRN
  Administered 2023-11-29: 80 mL via INTRAVENOUS

## 2023-11-30 ENCOUNTER — Ambulatory Visit (HOSPITAL_BASED_OUTPATIENT_CLINIC_OR_DEPARTMENT_OTHER)

## 2023-11-30 ENCOUNTER — Ambulatory Visit: Payer: Self-pay | Admitting: Physician Assistant

## 2023-11-30 ENCOUNTER — Other Ambulatory Visit (HOSPITAL_COMMUNITY): Payer: Self-pay

## 2023-11-30 NOTE — Progress Notes (Deleted)
 @Patient  ID: Thomas Sandoval., male    DOB: [birth year], 68 y.o.   MRN: [id number]  No chief complaint on file.   Referring provider: Gladis Mustard, *  HPI: Thomas CHARM Dollene Sandoval is a 68 y/o male with PMH of afib/flutter, chronic combined systolic/diastolic CHF, COPD, chronic respiratory failure with hypoxia, DM I and left lung nodule who presents today for  TEST/EVENTS :   Allergies  Allergen Reactions   Avelox  [Moxifloxacin  Hcl In Nacl] Other (See Comments)    Hemolysis  In 2012   Azithromycin  Other (See Comments) and Nausea And Vomiting    Severe stomach cramps; told to list as an allergy by dr. Orlinda ago   Bactrim [Sulfamethoxazole -Trimethoprim] Diarrhea and Nausea And Vomiting    Immunization History  Administered Date(s) Administered   Tdap 07/26/2021    Past Medical History:  Diagnosis Date   Arthritis    Bladder cancer (HCC)    Chronic back pain    Chronic combined systolic and diastolic CHF (congestive heart failure) (HCC) 07/16/2021   COPD (chronic obstructive pulmonary disease) (HCC)    COPD with chronic bronchitis and emphysema (HCC)    DDD (degenerative disc disease)    Diabetic retinopathy of both eyes (HCC)    Essential hypertension    GERD (gastroesophageal reflux disease)    History of atrial flutter 02/07/2011   Converted to NSR with Cardizem    History of chronic bronchitis    History of hemolytic anemia 02/07/2011   secondary to Avelox    HOH (hard of hearing)    HOH (hard of hearing)    no eardrum and nerve damage on R, also HOH on L   Mild renal insufficiency 08/25/2017   Mitral valve prolapse    a. 2D Echo 11/27/14: EF 55-60%; images were inadequate for LV wall motion assessment, + mild late systolic mitral valve prolapse involving the anterior leaflet.   PAD (peripheral artery disease) 04/09/2014   Dr Serene; bilateral SFA occlusion, R mid, L distal   PAF (paroxysmal atrial fibrillation) (HCC)    a. Dx 11/2014 during admission  for perf ulcer.   Perforated ulcer (HCC)    a. 11/2014 s/p surgery.   Productive cough    Smokers' cough (HCC)    Type 1 diabetes mellitus (HCC) 03/10/1975    Tobacco History: Social History   Tobacco Use  Smoking Status Every Day   Current packs/day: 0.50   Average packs/day: 0.5 packs/day for 30.0 years (15.0 ttl pk-yrs)   Types: Cigarettes  Smokeless Tobacco Former   Quit date: 06/08/1978  Tobacco Comments   11/12/2023 Patient smokes a pack daily   1/2 pk 03/23/23 NM, CMA    Ready to quit: Not Answered Counseling given: Not Answered Tobacco comments: 11/12/2023 Patient smokes a pack daily 1/2 pk 03/23/23 NM, CMA    Outpatient Medications Prior to Visit  Medication Sig Dispense Refill   Accu-Chek Softclix Lancets lancets Test BS in the morning, at noon and at bedtime Dx E10.649 300 each 3   Blood Glucose Monitoring Suppl (ACCU-CHEK GUIDE) w/Device KIT 4 (four) times daily.     Blood Glucose Monitoring Suppl DEVI 1 each by Does not apply route in the morning, at noon, and at bedtime. May substitute to any manufacturer covered by patient's insurance. 1 each 0   cefUROXime  (CEFTIN ) 250 MG tablet Take 250 mg by mouth as needed.     doxycycline  (VIBRA -TABS) 100 MG tablet Take 100 mg by mouth as needed.  empagliflozin  (JARDIANCE ) 10 MG TABS tablet Take 1 tablet (10 mg total) by mouth daily. 90 tablet 0   Fluticasone -Umeclidin-Vilant (TRELEGY ELLIPTA ) 100-62.5-25 MCG/ACT AEPB INHALE 1 PUFF INTO THE LUNGS daily 180 each 3   furosemide  (LASIX ) 40 MG tablet Take 0.5-1 tablets (20-40 mg total) by mouth See admin instructions. 20 mg Sun Tues Thurs Sat, 40 mg Mon Wed Fri 135 tablet 1   glucose blood (ACCU-CHEK GUIDE TEST) test strip Test BS in the morning, at noon and at bedtime Dx E10.649 300 strip 3   insulin  glargine (LANTUS ) 100 UNIT/ML injection INJECT 0.4 MLS (40 UNITS TOTAL) INTO THE SKIN IN THE MORNING. 30 mL 0   insulin  lispro (HUMALOG ) 100 UNIT/ML injection PER SLIDING SCALE: 190  - 200 = 2 UNITS. 200-300= 4u 300 AND ABOVE = 7 UNITS. 18 mL 3   Insulin  Syringe-Needle U-100 (B-D INS SYR ULTRAFINE 1CC/30G) 30G X 1/2 1 ML MISC Use 4 times a day with insulin  Dx E11.9 400 each 3   Insulin  Syringes, Disposable, U-100 1 ML MISC Use 4 times a day for insulin  injection Dx E11.9 400 each 5   ipratropium-albuterol  (DUONEB) 0.5-2.5 (3) MG/3ML SOLN INHALE CONTENTS OF 1 VIAL VIA NEBULIZER EVERY 4 HOURS AS NEEDED 360 mL 5   levalbuterol  (XOPENEX ) 0.31 MG/3ML nebulizer solution INHALE 3 MLS (0.31 MG TOTAL) BY NEBULIZATION EVERY 4 (FOUR) HOURS AS NEEDED FOR WHEEZING. 1620 mL 1   losartan  (COZAAR ) 50 MG tablet Take 1 tablet (50 mg total) by mouth daily. 90 tablet 0   metoprolol  succinate (TOPROL -XL) 100 MG 24 hr tablet Take 1 tablet (100 mg total) by mouth 2 (two) times daily. Take with or immediately following a meal. 180 tablet 2   naloxone  (NARCAN ) nasal spray 4 mg/0.1 mL Place 1 spray into the nose as needed (For opiate overdose). (Patient not taking: Reported on 11/12/2023) 1 each 2   omeprazole  (PRILOSEC) 40 MG capsule Take 1 capsule (40 mg total) by mouth daily. 90 capsule 1   OVER THE COUNTER MEDICATION Take 1-2 tablets by mouth See admin instructions. Super Beets gummies- Chew 1-2 gummies by mouth every day     OXYGEN  Inhale 3 L/min into the lungs continuous.     potassium chloride  (KLOR-CON ) 10 MEQ tablet Take 1 tablet (10 mEq total) by mouth daily. (Patient taking differently: Take 10 mEq by mouth as needed.) 90 tablet 1   XARELTO  20 MG TABS tablet TAKE 1 TABLET BY MOUTH DAILY  WITH SUPPER 100 tablet 2   No facility-administered medications prior to visit.     Review of Systems:   Constitutional:   No  weight loss, night sweats,  Fevers, chills, fatigue, or  lassitude.  HEENT:   No headaches,  Difficulty swallowing,  Tooth/dental problems, or  Sore throat,                No sneezing, itching, ear ache, nasal congestion, post nasal drip,   CV:  No chest pain,  Orthopnea, PND,  swelling in lower extremities, anasarca, dizziness, palpitations, syncope.   GI  No heartburn, indigestion, abdominal pain, nausea, vomiting, diarrhea, change in bowel habits, loss of appetite, bloody stools.   Resp: No shortness of breath with exertion or at rest.  No excess mucus, no productive cough,  No non-productive cough,  No coughing up of blood.  No change in color of mucus.  No wheezing.  No chest wall deformity  Skin: no rash or lesions.  GU: no dysuria, change in  color of urine, no urgency or frequency.  No flank pain, no hematuria   MS:  No joint pain or swelling.  No decreased range of motion.  No back pain.    Physical Exam  There were no vitals taken for this visit.  GEN: A/Ox3; pleasant , NAD, well nourished    HEENT:  [place]/AT,  EACs-clear, TMs-wnl, NOSE-clear, THROAT-clear, no lesions, no postnasal drip or exudate noted.   NECK:  Supple w/ fair ROM; no JVD; normal carotid impulses w/o bruits; no thyromegaly or nodules palpated; no lymphadenopathy.    RESP  Clear  P & A; w/o, wheezes/ rales/ or rhonchi. no accessory muscle use, no dullness to percussion  CARD:  RRR, no m/r/g, no peripheral edema, pulses intact, no cyanosis or clubbing.  GI:   Soft & nt; nml bowel sounds; no organomegaly or masses detected.   Musco: Warm bil, no deformities or joint swelling noted.   Neuro: alert, no focal deficits noted.    Skin: Warm, no lesions or rashes    Lab Results:  CBC    Component Value Date/Time   WBC 6.1 11/11/2023 1437   RBC 3.51 (L) 11/11/2023 1437   HGB 11.9 (L) 11/11/2023 1437   HGB 12.0 (L) 10/08/2023 1428   HCT 36.4 (L) 11/11/2023 1437   HCT 37.4 (L) 10/08/2023 1428   PLT 186.0 11/11/2023 1437   PLT 287 10/08/2023 1428   MCV 103.5 (H) 11/11/2023 1437   MCV 101 (H) 10/08/2023 1428   MCH 32.4 10/08/2023 1428   MCH 30.9 06/30/2023 1346   MCHC 32.8 11/11/2023 1437   RDW 16.4 (H) 11/11/2023 1437   RDW 13.2 10/08/2023 1428   LYMPHSABS 0.9 11/11/2023  1437   LYMPHSABS 1.5 10/08/2023 1428   MONOABS 0.7 11/11/2023 1437   EOSABS 0.1 11/11/2023 1437   EOSABS 0.3 10/08/2023 1428   BASOSABS 0.1 11/11/2023 1437   BASOSABS 0.1 10/08/2023 1428    BMET    Component Value Date/Time   NA 135 11/11/2023 1437   NA 140 10/08/2023 1428   K 4.2 11/11/2023 1437   CL 95 (L) 11/11/2023 1437   CO2 34 (H) 11/11/2023 1437   GLUCOSE 112 (H) 11/11/2023 1437   BUN 18 11/11/2023 1437   BUN 16 10/08/2023 1428   CREATININE 1.43 11/11/2023 1437   CREATININE 0.80 12/07/2014 1643   CALCIUM 8.8 11/11/2023 1437   GFRNONAA >60 06/30/2023 1346   GFRAA 72 05/01/2020 1507    BNP    Component Value Date/Time   BNP 886.2 (H) 02/11/2022 1206    ProBNP    Component Value Date/Time   PROBNP 454.4 (H) 02/25/2011 2224    Imaging: CT ABDOMEN PELVIS W CONTRAST Result Date: 11/30/2023 CLINICAL DATA:  Unintentional weight loss. Abdominal pain. Anorexia. Previous bladder carcinoma. * Tracking Code: BO * EXAM: CT ABDOMEN AND PELVIS WITH CONTRAST TECHNIQUE: Multidetector CT imaging of the abdomen and pelvis was performed using the standard protocol following bolus administration of intravenous contrast. RADIATION DOSE REDUCTION: This exam was performed according to the departmental dose-optimization program which includes automated exposure control, adjustment of the mA and/or kV according to patient size and/or use of iterative reconstruction technique. CONTRAST:  80mL ISOVUE -370 IOPAMIDOL  (ISOVUE -370) INJECTION 76% COMPARISON:  12/02/2020 FINDINGS: Lower Chest: No acute findings. Hepatobiliary: No suspicious hepatic masses identified. Gallbladder is unremarkable. No evidence of biliary ductal dilatation. Pancreas: No mass or inflammatory changes. Progressive diffuse pancreatic atrophy noted since prior exam. Spleen: Within normal limits in size and appearance.  Adrenals/Urinary Tract: No renal masses identified. Stable tiny left renal calculi versus vascular  calcifications. No evidence of ureteral calculi or hydronephrosis. Unremarkable unopacified urinary bladder, without evidence of recurrent bladder mass. Stomach/Bowel: No evidence of obstruction, inflammatory process or abnormal fluid collections. Normal appendix visualized. Vascular/Lymphatic: No pathologically enlarged lymph nodes. No acute vascular findings. Reproductive:  No mass or other significant abnormality. Other:  Increased size of small fat-containing umbilical hernia. Musculoskeletal: No suspicious bone lesions identified. Advanced lumbar spine degenerative changes again noted IMPRESSION: No acute findings. No evidence of recurrent or metastatic carcinoma. Small fat-containing umbilical hernia. Electronically Signed   By: Norleen DELENA Kil M.D.   On: 11/30/2023 12:29    Administration History     None           No data to display          No results found for: NITRICOXIDE   Assessment & Plan:   Assessment & Plan    No follow-ups on file.  Candis Dandy, PA-C 11/30/2023

## 2023-12-01 ENCOUNTER — Other Ambulatory Visit (HOSPITAL_COMMUNITY): Payer: Self-pay

## 2023-12-01 MED ORDER — HYDROCODONE-ACETAMINOPHEN 10-325 MG PO TABS: 0.5000 | ORAL_TABLET | Freq: Four times a day (QID) | ORAL | 0 refills | Status: DC | PRN

## 2023-12-01 MED ORDER — HYDROCODONE-ACETAMINOPHEN 10-325 MG PO TABS
0.5000 | ORAL_TABLET | Freq: Four times a day (QID) | ORAL | 0 refills | Status: DC | PRN
  Filled 2023-12-31: qty 120, 30d supply, fill #0

## 2023-12-01 MED ORDER — HYDROCODONE-ACETAMINOPHEN 10-325 MG PO TABS
0.5000 | ORAL_TABLET | Freq: Four times a day (QID) | ORAL | 0 refills | Status: AC | PRN
  Filled 2023-12-01: qty 120, 30d supply, fill #0

## 2023-12-06 ENCOUNTER — Encounter (HOSPITAL_BASED_OUTPATIENT_CLINIC_OR_DEPARTMENT_OTHER): Payer: Self-pay

## 2023-12-06 ENCOUNTER — Ambulatory Visit (INDEPENDENT_AMBULATORY_CARE_PROVIDER_SITE_OTHER)

## 2023-12-06 VITALS — BP 113/71 | HR 77 | Ht 68.0 in | Wt 180.0 lb

## 2023-12-06 DIAGNOSIS — J9611 Chronic respiratory failure with hypoxia: Secondary | ICD-10-CM | POA: Diagnosis not present

## 2023-12-06 DIAGNOSIS — J181 Lobar pneumonia, unspecified organism: Secondary | ICD-10-CM | POA: Diagnosis not present

## 2023-12-06 DIAGNOSIS — I5042 Chronic combined systolic (congestive) and diastolic (congestive) heart failure: Secondary | ICD-10-CM | POA: Diagnosis not present

## 2023-12-06 DIAGNOSIS — J441 Chronic obstructive pulmonary disease with (acute) exacerbation: Secondary | ICD-10-CM

## 2023-12-06 DIAGNOSIS — E109 Type 1 diabetes mellitus without complications: Secondary | ICD-10-CM | POA: Diagnosis not present

## 2023-12-06 DIAGNOSIS — R911 Solitary pulmonary nodule: Secondary | ICD-10-CM

## 2023-12-06 DIAGNOSIS — F17210 Nicotine dependence, cigarettes, uncomplicated: Secondary | ICD-10-CM

## 2023-12-06 MED ORDER — PREDNISONE 10 MG PO TABS: ORAL_TABLET | ORAL | 0 refills | Status: AC

## 2023-12-06 MED ORDER — GUAIFENESIN ER 600 MG PO TB12: 600.0000 mg | ORAL_TABLET | Freq: Two times a day (BID) | ORAL | 1 refills | Status: DC

## 2023-12-06 MED ORDER — AMOXICILLIN-POT CLAVULANATE 875-125 MG PO TABS: 1.0000 | ORAL_TABLET | Freq: Two times a day (BID) | ORAL | 0 refills | Status: DC

## 2023-12-06 NOTE — Progress Notes (Signed)
 @Patient  ID: Thomas JONETTA Dollene Mickey., male    DOB: April 29, 1955, 68 y.o.   MRN: [id number]  Chief Complaint  Patient presents with   Hemoptysis    Acute visit     Referring provider: Gladis Mustard, *  HPI: Thomas Sandoval is a 68 y/o male with PMH of afib/flutter on Xarelto , chronic combined systolic/diastolic CHF, COPD, chronic respiratory failure with hypoxia, DM I and left lung nodule who presents today for evaluation of hemoptysis.  He reports that about 1 month ago he started having a productive cough.  At times this had some blood streaked in it.  He reports shortness of breath at baseline but is mostly wheelchair-bound therefore has not noticed if he is more short of breath than his usual.  He denies any fever or chills, chest pain, night sweats.  He does report fatigue, feeling unwell and productive cough.  He reports that he has had issues with his stomach and is currently on a reflux medication.  He has had issues with GI bleeding in the past and was unsure if the blood was coming from his stomach or from his lungs.  He does clearly state that there was some blood in his sputum after coughing it up.  He denies frank blood or clots.  TEST/EVENTS : CT abdomen and pelvis completed on 11/29/2023:IMPRESSION: No acute findings. No evidence of recurrent or metastatic carcinoma.  CT super D chest without contrast completed on 10/19/2023: IMPRESSION: 1. Unchanged spiculated nodule of the posterior medial left upper lobe measuring 1.2 x 1.2 cm. As previously noted, there is a calcified component at the superior margin of the lesion. Although nodule remains morphologically suspicious for malignancy, behavior over time suggests a benign infectious or inflammatory etiology. Continued attention on follow-up. 2. No evidence of lymphadenopathy or metastatic disease in the chest. 3. Emphysema and diffuse bilateral bronchial wall thickening. 4. Scarring and centrilobular nodularity of  the right lung base. 5. Coronary artery disease. Allergies  Allergen Reactions   Avelox  [Moxifloxacin  Hcl In Nacl] Other (See Comments)    Hemolysis  In 2012   Azithromycin  Other (See Comments) and Nausea And Vomiting    Severe stomach cramps; told to list as an allergy by dr. Orlinda ago   Bactrim [Sulfamethoxazole -Trimethoprim] Diarrhea and Nausea And Vomiting    Immunization History  Administered Date(s) Administered   Tdap 07/26/2021    Past Medical History:  Diagnosis Date   Arthritis    Bladder cancer (HCC)    Chronic back pain    Chronic combined systolic and diastolic CHF (congestive heart failure) (HCC) 07/16/2021   COPD (chronic obstructive pulmonary disease) (HCC)    COPD with chronic bronchitis and emphysema (HCC)    DDD (degenerative disc disease)    Diabetic retinopathy of both eyes (HCC)    Essential hypertension    GERD (gastroesophageal reflux disease)    History of atrial flutter 02/07/2011   Converted to NSR with Cardizem    History of chronic bronchitis    History of hemolytic anemia 02/07/2011   secondary to Avelox    HOH (hard of hearing)    HOH (hard of hearing)    no eardrum and nerve damage on R, also HOH on L   Mild renal insufficiency 08/25/2017   Mitral valve prolapse    a. 2D Echo 11/27/14: EF 55-60%; images were inadequate for LV wall motion assessment, + mild late systolic mitral valve prolapse involving the anterior leaflet.   PAD (peripheral artery disease) 04/09/2014  Dr Serene; bilateral SFA occlusion, R mid, L distal   PAF (paroxysmal atrial fibrillation) (HCC)    a. Dx 11/2014 during admission for perf ulcer.   Perforated ulcer (HCC)    a. 11/2014 s/p surgery.   Productive cough    Smokers' cough (HCC)    Type 1 diabetes mellitus (HCC) 03/10/1975    Tobacco History: Social History   Tobacco Use  Smoking Status Every Day   Current packs/day: 0.50   Average packs/day: 0.5 packs/day for 30.0 years (15.0 ttl pk-yrs)   Types:  Cigarettes  Smokeless Tobacco Former   Quit date: 06/08/1978  Tobacco Comments   11/12/2023 Patient smokes a pack daily   1/2 pk 03/23/23 NM, CMA    Ready to quit: Not Answered Counseling given: Not Answered Tobacco comments: 11/12/2023 Patient smokes a pack daily 1/2 pk 03/23/23 NM, CMA    Outpatient Medications Prior to Visit  Medication Sig Dispense Refill   Accu-Chek Softclix Lancets lancets Test BS in the morning, at noon and at bedtime Dx E10.649 300 each 3   Blood Glucose Monitoring Suppl (ACCU-CHEK GUIDE) w/Device KIT 4 (four) times daily.     Blood Glucose Monitoring Suppl DEVI 1 each by Does not apply route in the morning, at noon, and at bedtime. May substitute to any manufacturer covered by patient's insurance. 1 each 0   cefUROXime  (CEFTIN ) 250 MG tablet Take 250 mg by mouth as needed.     doxycycline  (VIBRA -TABS) 100 MG tablet Take 100 mg by mouth as needed.     empagliflozin  (JARDIANCE ) 10 MG TABS tablet Take 1 tablet (10 mg total) by mouth daily. 90 tablet 0   Fluticasone -Umeclidin-Vilant (TRELEGY ELLIPTA ) 100-62.5-25 MCG/ACT AEPB INHALE 1 PUFF INTO THE LUNGS daily 180 each 3   furosemide  (LASIX ) 40 MG tablet Take 0.5-1 tablets (20-40 mg total) by mouth See admin instructions. 20 mg Sun Tues Thurs Sat, 40 mg Mon Wed Fri 135 tablet 1   glucose blood (ACCU-CHEK GUIDE TEST) test strip Test BS in the morning, at noon and at bedtime Dx E10.649 300 strip 3   HYDROcodone -acetaminophen  (NORCO) 10-325 MG tablet Take 0.5-1 tablets by mouth every 6 (six) hours as needed for severe pain (pain score 7-10) or moderate pain (pain score 4-6). 120 tablet 0   [START ON 12/31/2023] HYDROcodone -acetaminophen  (NORCO) 10-325 MG tablet Take 0.5-1 tablets by mouth every 6 (six) hours as needed for severe pain (pain score 7-10) or moderate pain (pain score 4-6). 120 tablet 0   [START ON 01/29/2024] HYDROcodone -acetaminophen  (NORCO) 10-325 MG tablet Take 0.5-1 tablets by mouth every 6 (six) hours as  needed for severe pain (pain score 7-10) or moderate pain (pain score 4-6). 120 tablet 0   insulin  glargine (LANTUS ) 100 UNIT/ML injection INJECT 0.4 MLS (40 UNITS TOTAL) INTO THE SKIN IN THE MORNING. 30 mL 0   insulin  lispro (HUMALOG ) 100 UNIT/ML injection PER SLIDING SCALE: 190 - 200 = 2 UNITS. 200-300= 4u 300 AND ABOVE = 7 UNITS. 18 mL 3   Insulin  Syringe-Needle U-100 (B-D INS SYR ULTRAFINE 1CC/30G) 30G X 1/2 1 ML MISC Use 4 times a day with insulin  Dx E11.9 400 each 3   Insulin  Syringes, Disposable, U-100 1 ML MISC Use 4 times a day for insulin  injection Dx E11.9 400 each 5   ipratropium-albuterol  (DUONEB) 0.5-2.5 (3) MG/3ML SOLN INHALE CONTENTS OF 1 VIAL VIA NEBULIZER EVERY 4 HOURS AS NEEDED 360 mL 5   levalbuterol  (XOPENEX ) 0.31 MG/3ML nebulizer solution INHALE 3 MLS (0.31  MG TOTAL) BY NEBULIZATION EVERY 4 (FOUR) HOURS AS NEEDED FOR WHEEZING. 1620 mL 1   losartan  (COZAAR ) 50 MG tablet Take 1 tablet (50 mg total) by mouth daily. 90 tablet 0   metoprolol  succinate (TOPROL -XL) 100 MG 24 hr tablet Take 1 tablet (100 mg total) by mouth 2 (two) times daily. Take with or immediately following a meal. 180 tablet 2   omeprazole  (PRILOSEC) 40 MG capsule Take 1 capsule (40 mg total) by mouth daily. 90 capsule 1   OVER THE COUNTER MEDICATION Take 1-2 tablets by mouth See admin instructions. Super Beets gummies- Chew 1-2 gummies by mouth every day     OXYGEN  Inhale 3 L/min into the lungs continuous.     XARELTO  20 MG TABS tablet TAKE 1 TABLET BY MOUTH DAILY  WITH SUPPER 100 tablet 2   naloxone  (NARCAN ) nasal spray 4 mg/0.1 mL Place 1 spray into the nose as needed (For opiate overdose). (Patient not taking: Reported on 11/12/2023) 1 each 2   potassium chloride  (KLOR-CON ) 10 MEQ tablet Take 1 tablet (10 mEq total) by mouth daily. (Patient taking differently: Take 10 mEq by mouth as needed.) 90 tablet 1   No facility-administered medications prior to visit.     Review of Systems: Positive as mentioned in  HPI  Constitutional:   No  weight loss, night sweats,  Fevers, chills, fatigue, or  lassitude.  HEENT:   No headaches,  Difficulty swallowing,  Tooth/dental problems, or  Sore throat,                No sneezing, itching, ear ache, nasal congestion, post nasal drip,   CV:  No chest pain,  Orthopnea, PND, swelling in lower extremities, anasarca, dizziness, palpitations, syncope.   GI  No heartburn, indigestion, abdominal pain, nausea, vomiting, diarrhea, change in bowel habits, loss of appetite, bloody stools.   Resp: No shortness of breath with exertion or at rest.  No excess mucus, no productive cough,  No non-productive cough,  No coughing up of blood.  No change in color of mucus.  No wheezing.  No chest wall deformity  Skin: no rash or lesions.  GU: no dysuria, change in color of urine, no urgency or frequency.  No flank pain, no hematuria   MS:  No joint pain or swelling.  No decreased range of motion.  No back pain.    Physical Exam  BP 113/71   Pulse 77   Ht 5' 8 (1.727 m)   Wt 180 lb (81.6 kg)   SpO2 100% Comment: 3 liters  BMI 27.37 kg/m   GEN: A/Ox3; pleasant , NAD, well nourished    HEENT:  [place]/AT,  EACs-clear, TMs-wnl, NOSE-clear, THROAT-clear, no lesions, no postnasal drip or exudate noted.   NECK:  Supple w/ fair ROM; no JVD; normal carotid impulses w/o bruits; no thyromegaly or nodules palpated; no lymphadenopathy.    RESP crackles right lower lobe.  Scattered expiratory wheezes throughout no accessory muscle use, no dullness to percussion  CARD:  RRR, no m/r/g, no peripheral edema, pulses intact, no cyanosis or clubbing.  GI:   Soft & nt; nml bowel sounds; no organomegaly or masses detected.   Musco: Warm bil, no deformities or joint swelling noted.   Neuro: alert, no focal deficits noted.    Skin: Warm, no lesions or rashes    Lab Results:  CBC    Component Value Date/Time   WBC 6.1 11/11/2023 1437   RBC 3.51 (L) 11/11/2023 1437  HGB 11.9 (L)  11/11/2023 1437   HGB 12.0 (L) 10/08/2023 1428   HCT 36.4 (L) 11/11/2023 1437   HCT 37.4 (L) 10/08/2023 1428   PLT 186.0 11/11/2023 1437   PLT 287 10/08/2023 1428   MCV 103.5 (H) 11/11/2023 1437   MCV 101 (H) 10/08/2023 1428   MCH 32.4 10/08/2023 1428   MCH 30.9 06/30/2023 1346   MCHC 32.8 11/11/2023 1437   RDW 16.4 (H) 11/11/2023 1437   RDW 13.2 10/08/2023 1428   LYMPHSABS 0.9 11/11/2023 1437   LYMPHSABS 1.5 10/08/2023 1428   MONOABS 0.7 11/11/2023 1437   EOSABS 0.1 11/11/2023 1437   EOSABS 0.3 10/08/2023 1428   BASOSABS 0.1 11/11/2023 1437   BASOSABS 0.1 10/08/2023 1428    BMET    Component Value Date/Time   NA 135 11/11/2023 1437   NA 140 10/08/2023 1428   K 4.2 11/11/2023 1437   CL 95 (L) 11/11/2023 1437   CO2 34 (H) 11/11/2023 1437   GLUCOSE 112 (H) 11/11/2023 1437   BUN 18 11/11/2023 1437   BUN 16 10/08/2023 1428   CREATININE 1.43 11/11/2023 1437   CREATININE 0.80 12/07/2014 1643   CALCIUM 8.8 11/11/2023 1437   GFRNONAA >60 06/30/2023 1346   GFRAA 72 05/01/2020 1507    BNP    Component Value Date/Time   BNP 886.2 (H) 02/11/2022 1206    ProBNP    Component Value Date/Time   PROBNP 454.4 (H) 02/25/2011 2224    Imaging: CT ABDOMEN PELVIS W CONTRAST Result Date: 11/30/2023 CLINICAL DATA:  Unintentional weight loss. Abdominal pain. Anorexia. Previous bladder carcinoma. * Tracking Code: BO * EXAM: CT ABDOMEN AND PELVIS WITH CONTRAST TECHNIQUE: Multidetector CT imaging of the abdomen and pelvis was performed using the standard protocol following bolus administration of intravenous contrast. RADIATION DOSE REDUCTION: This exam was performed according to the departmental dose-optimization program which includes automated exposure control, adjustment of the mA and/or kV according to patient size and/or use of iterative reconstruction technique. CONTRAST:  80mL ISOVUE -370 IOPAMIDOL  (ISOVUE -370) INJECTION 76% COMPARISON:  12/02/2020 FINDINGS: Lower Chest: No acute  findings. Hepatobiliary: No suspicious hepatic masses identified. Gallbladder is unremarkable. No evidence of biliary ductal dilatation. Pancreas: No mass or inflammatory changes. Progressive diffuse pancreatic atrophy noted since prior exam. Spleen: Within normal limits in size and appearance. Adrenals/Urinary Tract: No renal masses identified. Stable tiny left renal calculi versus vascular calcifications. No evidence of ureteral calculi or hydronephrosis. Unremarkable unopacified urinary bladder, without evidence of recurrent bladder mass. Stomach/Bowel: No evidence of obstruction, inflammatory process or abnormal fluid collections. Normal appendix visualized. Vascular/Lymphatic: No pathologically enlarged lymph nodes. No acute vascular findings. Reproductive:  No mass or other significant abnormality. Other:  Increased size of small fat-containing umbilical hernia. Musculoskeletal: No suspicious bone lesions identified. Advanced lumbar spine degenerative changes again noted IMPRESSION: No acute findings. No evidence of recurrent or metastatic carcinoma. Small fat-containing umbilical hernia. Electronically Signed   By: Norleen DELENA Kil M.D.   On: 11/30/2023 12:29    Administration History     None           No data to display          No results found for: NITRICOXIDE   Assessment & Plan:  Thomas Sandoval is a 68 y/o male with PMH of afib/flutter on Xarelto , chronic combined systolic/diastolic CHF, COPD, chronic respiratory failure with hypoxia, DM I and left lung nodule who presents today for evaluation of hemoptysis.  After further discussion it seems as  though there was some blood in his sputum.  This is likely due to coughing in the setting of ongoing treatment with Xarelto .  Based on his physical exam it seems as though he has a right lower lobe pneumonia and has developed an exacerbation as well.  Coughing from this likely caused the hemoptysis, which has now resolved.  Notably, his  most recent CT demonstrated a stable lung nodule. Assessment & Plan  1.  COPD with acute exacerbation and right lower lobe pneumonia: -Start oral Augmentin  x 7 days -Start prednisone  taper x 8 days -Check sputum culture - Start Mucinex  twice daily  Return as scheduled with Dr. Shelah on 10/29.  Candis Dandy, PA-C 12/06/2023

## 2023-12-06 NOTE — Patient Instructions (Signed)
 Take antibiotic and steroid taper as prescribed; rx sent to pharmacy of choice.  Continue Trelegy and nebs as prescribed.  Start Mucinex  twice daily.  Sputum cup provided for culture.

## 2023-12-08 ENCOUNTER — Ambulatory Visit
Admission: RE | Admit: 2023-12-08 | Discharge: 2023-12-08 | Disposition: A | Discharge status: Home / Self Care | Source: Ambulatory Visit | Attending: Physical Medicine and Rehabilitation | Admitting: Physical Medicine and Rehabilitation

## 2023-12-08 DIAGNOSIS — M5127 Other intervertebral disc displacement, lumbosacral region: Secondary | ICD-10-CM | POA: Diagnosis not present

## 2023-12-08 DIAGNOSIS — M47816 Spondylosis without myelopathy or radiculopathy, lumbar region: Secondary | ICD-10-CM | POA: Diagnosis not present

## 2023-12-08 DIAGNOSIS — M545 Low back pain, unspecified: Secondary | ICD-10-CM

## 2023-12-17 ENCOUNTER — Ambulatory Visit: Payer: Self-pay | Admitting: Physical Medicine and Rehabilitation

## 2023-12-17 DIAGNOSIS — G8929 Other chronic pain: Secondary | ICD-10-CM

## 2023-12-17 DIAGNOSIS — M48061 Spinal stenosis, lumbar region without neurogenic claudication: Secondary | ICD-10-CM | POA: Insufficient documentation

## 2023-12-17 NOTE — Telephone Encounter (Signed)
 Spoke with patient and his wife, discussed results of MRI. Agreeable to proceed with surgical referral as prior discussed.    Joesph JAYSON Likes, DO 12/17/2023

## 2023-12-20 DIAGNOSIS — J9601 Acute respiratory failure with hypoxia: Secondary | ICD-10-CM | POA: Diagnosis not present

## 2023-12-22 ENCOUNTER — Other Ambulatory Visit: Payer: Self-pay | Admitting: Emergency Medicine

## 2023-12-22 DIAGNOSIS — J189 Pneumonia, unspecified organism: Secondary | ICD-10-CM

## 2023-12-22 DIAGNOSIS — J449 Chronic obstructive pulmonary disease, unspecified: Secondary | ICD-10-CM

## 2023-12-22 DIAGNOSIS — R946 Abnormal results of thyroid function studies: Secondary | ICD-10-CM

## 2023-12-23 ENCOUNTER — Telehealth: Payer: Self-pay | Admitting: *Deleted

## 2023-12-23 DIAGNOSIS — M48061 Spinal stenosis, lumbar region without neurogenic claudication: Secondary | ICD-10-CM

## 2023-12-23 DIAGNOSIS — M545 Low back pain, unspecified: Secondary | ICD-10-CM

## 2023-12-23 NOTE — Telephone Encounter (Signed)
 Thomas Sandoval was referred to neurosurgery but they have sent him to [facility] in [place]. HIs insurance is telling them he has to stay in [place] and so they want a referral to [facility] next door to our office.

## 2023-12-24 NOTE — Telephone Encounter (Signed)
 April, the original referral did not specify [place], but the new format for the neurosurgical referrals has is only sending certain folks to the [place] office.  I have edited the referral and resent it; do mind assisting in getting this through?  Thank you!

## 2023-12-24 NOTE — Addendum Note (Signed)
 Addended by: EMELINE SEARCH on: 12/24/2023 03:39 PM   Modules accepted: Orders

## 2023-12-30 ENCOUNTER — Other Ambulatory Visit (HOSPITAL_COMMUNITY): Payer: Self-pay

## 2023-12-31 ENCOUNTER — Other Ambulatory Visit: Payer: Self-pay

## 2023-12-31 ENCOUNTER — Other Ambulatory Visit (HOSPITAL_COMMUNITY): Payer: Self-pay

## 2024-01-05 ENCOUNTER — Ambulatory Visit: Admitting: Emergency Medicine

## 2024-01-05 ENCOUNTER — Encounter: Payer: Self-pay | Admitting: Emergency Medicine

## 2024-01-05 VITALS — BP 128/74 | HR 118 | Wt 182.0 lb

## 2024-01-05 DIAGNOSIS — R911 Solitary pulmonary nodule: Secondary | ICD-10-CM | POA: Diagnosis not present

## 2024-01-05 DIAGNOSIS — J439 Emphysema, unspecified: Secondary | ICD-10-CM

## 2024-01-05 DIAGNOSIS — J9611 Chronic respiratory failure with hypoxia: Secondary | ICD-10-CM

## 2024-01-05 DIAGNOSIS — J4489 Other specified chronic obstructive pulmonary disease: Secondary | ICD-10-CM

## 2024-01-05 NOTE — Progress Notes (Signed)
 Subjective:    Patient ID: Thomas Sandoval., male    DOB: November 21, 1955, 68 y.o.   MRN: [id number]  HPI  ROV 01/05/2024 --Azlan is 16 with a history of heavy tobacco use and associated severe COPD, chronic bronchitis.  His imaging shows bronchiectasis and scattered pulmonary nodular disease, stable on serial films.  Most recent was August 2025 as below.  He has diabetes, atrial fibrillation on anticoagulation, hypertension with diastolic dysfunction.  He was seen in our office 1 month ago with an acute exacerbation of his COPD, cough with some associated scant hemoptysis.  He was treated with Augmentin  and prednisone   > better.  He reports today that he has persistent congestion, productive of brownish mucous. He rotates doxycycline  and cefuroxime .  He is being managed on Trelegy, DuoNeb qid, xopenex .  He is smoking just under a pack a day.  He does not want the flu shot   CT chest 10/19/2023 reviewed by me, shows no mediastinal or hilar adenopathy, unchanged 1.2 x 1.2 cm posterior medial left upper lobe pulmonary nodule with calcification.  There is a calcified posterior right upper lobe nodule, unchanged biapical pleural-parenchymal scarring   Review of Systems As per HPI  Past Medical History:  Diagnosis Date   Arthritis    Bladder cancer (HCC)    Chronic back pain    Chronic combined systolic and diastolic CHF (congestive heart failure) (HCC) 07/16/2021   COPD (chronic obstructive pulmonary disease) (HCC)    COPD with chronic bronchitis and emphysema (HCC)    DDD (degenerative disc disease)    Diabetic retinopathy of both eyes (HCC)    Essential hypertension    GERD (gastroesophageal reflux disease)    History of atrial flutter 02/07/2011   Converted to NSR with Cardizem    History of chronic bronchitis    History of hemolytic anemia 02/07/2011   secondary to Avelox    HOH (hard of hearing)    HOH (hard of hearing)    no eardrum and nerve damage on R, also HOH on L   Mild  renal insufficiency 08/25/2017   Mitral valve prolapse    a. 2D Echo 11/27/14: EF 55-60%; images were inadequate for LV wall motion assessment, + mild late systolic mitral valve prolapse involving the anterior leaflet.   PAD (peripheral artery disease) 04/09/2014   Dr Serene; bilateral SFA occlusion, R mid, L distal   PAF (paroxysmal atrial fibrillation) (HCC)    a. Dx 11/2014 during admission for perf ulcer.   Perforated ulcer (HCC)    a. 11/2014 s/p surgery.   Productive cough    Smokers' cough (HCC)    Type 1 diabetes mellitus (HCC) 03/10/1975     Family History  Problem Relation Age of Onset   Breast cancer Mother    Cancer Mother        Breast   Rheumatic fever Father    Heart disease Father    Heart attack Father        Massive    Diabetes Son      Social History   Socioeconomic History   Marital status: Married    Spouse name: Not on file   Number of children: Not on file   Years of education: Not on file   Highest education level: Not on file  Occupational History   Occupation: Tobacco Farmer  Tobacco Use   Smoking status: Every Day    Current packs/day: 0.50    Average packs/day: 0.5 packs/day for 30.0 years (15.0  ttl pk-yrs)    Types: Cigarettes   Smokeless tobacco: Former    Quit date: 06/08/1978   Tobacco comments:    Patient smokes a less than pack daily 1/2 pk 01/05/2024 NM, CMA /  Vaping Use   Vaping status: Never Used  Substance and Sexual Activity   Alcohol use: Not Currently    Comment: 5 quarts per week 12/26/21   Drug use: No   Sexual activity: Not on file  Other Topics Concern   Not on file  Social History Narrative   Lives in [place] with wife and 2 sons.    Social Drivers of Corporate Investment Banker Strain: Low Risk  (04/12/2023)   Overall Financial Resource Strain (CARDIA)    Difficulty of Paying Living Expenses: Not very hard  Food Insecurity: Low Risk  (11/24/2022)   Received from [facility]   Hunger Vital Sign    Within the  past 12 months, you worried that your food would run out before you got money to buy more: Never true    Within the past 12 months, the food you bought just didn't last and you didn't have money to get more. : Never true  Transportation Needs: No Transportation Needs (11/24/2022)   Received from [facility]    In the past 12 months, has lack of reliable transportation kept you from medical appointments, meetings, work or from getting things needed for daily living? : No  Physical Activity: Inactive (04/12/2023)   Exercise Vital Sign    Days of Exercise per Week: 0 days    Minutes of Exercise per Session: 0 min  Stress: No Stress Concern Present (04/12/2023)   [employer] of Occupational Health - Occupational Stress Questionnaire    Feeling of Stress : Not at all  Social Connections: Socially Isolated (04/12/2023)   Social Connection and Isolation Panel    Frequency of Communication with Friends and Family: Once a week    Frequency of Social Gatherings with Friends and Family: Never    Attends Religious Services: Never    Database Administrator or Organizations: No    Attends Banker Meetings: Never    Marital Status: Married  Catering Manager Violence: Not At Risk (04/12/2023)   Humiliation, Afraid, Rape, and Kick questionnaire    Fear of Current or Ex-Partner: No    Emotionally Abused: No    Physically Abused: No    Sexually Abused: No     Allergies  Allergen Reactions   Avelox  [Moxifloxacin  Hcl In Nacl] Other (See Comments)    Hemolysis  In 2012   Azithromycin  Other (See Comments) and Nausea And Vomiting    Severe stomach cramps; told to list as an allergy by dr. Orlinda ago   Bactrim [Sulfamethoxazole -Trimethoprim] Diarrhea and Nausea And Vomiting     Outpatient Medications Prior to Visit  Medication Sig Dispense Refill   Accu-Chek Softclix Lancets lancets Test BS in the morning, at noon and at bedtime Dx E10.649 300 each 3   Blood Glucose  Monitoring Suppl (ACCU-CHEK GUIDE) w/Device KIT 4 (four) times daily.     Blood Glucose Monitoring Suppl DEVI 1 each by Does not apply route in the morning, at noon, and at bedtime. May substitute to any manufacturer covered by patient's insurance. 1 each 0   cefUROXime  (CEFTIN ) 250 MG tablet Take 250 mg by mouth as needed.     doxycycline  (VIBRA -TABS) 100 MG tablet Take 100 mg by mouth as needed.  empagliflozin  (JARDIANCE ) 10 MG TABS tablet Take 1 tablet (10 mg total) by mouth daily. 90 tablet 0   Fluticasone -Umeclidin-Vilant (TRELEGY ELLIPTA ) 100-62.5-25 MCG/ACT AEPB INHALE 1 PUFF INTO THE LUNGS daily 180 each 3   furosemide  (LASIX ) 40 MG tablet Take 0.5-1 tablets (20-40 mg total) by mouth See admin instructions. 20 mg Sun Tues Thurs Sat, 40 mg Mon Wed Fri 135 tablet 1   glucose blood (ACCU-CHEK GUIDE TEST) test strip Test BS in the morning, at noon and at bedtime Dx E10.649 300 strip 3   HYDROcodone -acetaminophen  (NORCO) 10-325 MG tablet Take 0.5-1 tablets by mouth every 6 (six) hours as needed for severe pain (pain score 7-10) or moderate pain (pain score 4-6). 120 tablet 0   [START ON 01/29/2024] HYDROcodone -acetaminophen  (NORCO) 10-325 MG tablet Take 0.5-1 tablets by mouth every 6 (six) hours as needed for severe pain (pain score 7-10) or moderate pain (pain score 4-6). 120 tablet 0   insulin  glargine (LANTUS ) 100 UNIT/ML injection INJECT 0.4 MLS (40 UNITS TOTAL) INTO THE SKIN IN THE MORNING. 30 mL 0   insulin  lispro (HUMALOG ) 100 UNIT/ML injection PER SLIDING SCALE: 190 - 200 = 2 UNITS. 200-300= 4u 300 AND ABOVE = 7 UNITS. 18 mL 3   Insulin  Syringe-Needle U-100 (B-D INS SYR ULTRAFINE 1CC/30G) 30G X 1/2 1 ML MISC Use 4 times a day with insulin  Dx E11.9 400 each 3   Insulin  Syringes, Disposable, U-100 1 ML MISC Use 4 times a day for insulin  injection Dx E11.9 400 each 5   ipratropium-albuterol  (DUONEB) 0.5-2.5 (3) MG/3ML SOLN INHALE CONTENTS OF 1 VIAL VIA NEBULIZER EVERY 4 HOURS AS NEEDED 360  mL 5   levalbuterol  (XOPENEX ) 0.31 MG/3ML nebulizer solution INHALE 3 MLS (0.31 MG TOTAL) BY NEBULIZATION EVERY 4 (FOUR) HOURS AS NEEDED FOR WHEEZING. 1620 mL 1   losartan  (COZAAR ) 50 MG tablet Take 1 tablet (50 mg total) by mouth daily. 90 tablet 0   metoprolol  succinate (TOPROL -XL) 100 MG 24 hr tablet Take 1 tablet (100 mg total) by mouth 2 (two) times daily. Take with or immediately following a meal. 180 tablet 2   omeprazole  (PRILOSEC) 40 MG capsule Take 1 capsule (40 mg total) by mouth daily. 90 capsule 1   OVER THE COUNTER MEDICATION Take 1-2 tablets by mouth See admin instructions. Super Beets gummies- Chew 1-2 gummies by mouth every day     OXYGEN  Inhale 3 L/min into the lungs continuous.     XARELTO  20 MG TABS tablet TAKE 1 TABLET BY MOUTH DAILY  WITH SUPPER 100 tablet 2   amoxicillin -clavulanate (AUGMENTIN ) 875-125 MG tablet Take 1 tablet by mouth 2 (two) times daily. (Patient not taking: Reported on 01/05/2024) 14 tablet 0   guaiFENesin  (MUCINEX ) 600 MG 12 hr tablet Take 1 tablet (600 mg total) by mouth 2 (two) times daily. (Patient not taking: Reported on 01/05/2024) 30 tablet 1   No facility-administered medications prior to visit.         Objective:   Physical Exam  Vitals:   01/05/24 1557  BP: 128/74  Pulse: (!) 118  SpO2: 99%  Weight: 182 lb (82.6 kg)     Gen: Pleasant, well-nourished, in no distress,  normal affect, frequent cough  ENT: No lesions,  mouth clear,  oropharynx clear, no postnasal drip, very poor hearing   Neck: No JVD, no stridor  Lungs: No use of accessory muscles, good air movement.  Soft expiratory wheeze at the left base, otherwise clear.  Improved from his  prior exam  Cardiovascular: RRR, heart sounds normal, no murmur or gallops, no peripheral edema  Musculoskeletal: No deformities, no cyanosis or clubbing  Neuro: alert, awake, non focal  Skin: Warm, no lesions or rash     Assessment & Plan:  COPD with chronic bronchitis and  emphysema (HCC) Continues to have dyspnea, chronic bronchitic symptoms.  He did improve after he was treated with Augmentin  about 1 month ago.  He is no longer having any blood-tinged sputum even on his anticoagulation.  Unfortunately we will not be able to write this cycle as long as he is smoking and I do not think he is motivated to quit.  We are doing our best to suppress his symptoms.  We could consider adding Daliresp .  I did discuss initiation of prednisone  with him but he understands how that can impact his diabetes and is hesitant to do so which I do understand.  For now I will continue the same regimen.  Encouraged him to do his very best to decrease cigarettes as he is able   Please continue your Trelegy 1 inhalation once daily.  Rinse and gargle after using. Continue your DuoNeb 4 times a day Continue Xopenex  as you have been taken Continue rotating your doxycycline  and your cefuroxime  for 1 week every other month Do your best to decrease your cigarettes.  Ultimate goal will be to stop altogether. Follow-up with APP in February 2026 after your CT chest to review the results together.  Chronic respiratory failure with hypoxia (HCC) Plan to continue same oxygen  regimen.  He knows not to smoke around the oxygen   Nodule of left lung CT scan of the chest shows no change in the 1.2 x 1.2 cm posterior medial left upper lobe nodule.  There is some calcification present which is reassuring.  I will check another scan in 6 months and if unchanged we can probably transition him over into the lung cancer screening program.  I personally spent a total of 40 minutes in the care of the patient today including preparing to see the patient, getting/reviewing separately obtained history, performing a medically appropriate exam/evaluation, counseling and educating, placing orders, documenting clinical information in the EHR, independently interpreting results, and communicating results.    Lamar Chris,  MD, PhD 01/05/2024, 4:43 PM [facility] [phone] or if no answer before 7:00PM call [phone] For any issues after 7:00PM please call eLink [phone]

## 2024-01-05 NOTE — Assessment & Plan Note (Signed)
 Plan to continue same oxygen  regimen.  He knows not to smoke around the oxygen 

## 2024-01-05 NOTE — Patient Instructions (Addendum)
 Please continue your Trelegy 1 inhalation once daily.  Rinse and gargle after using. Continue your DuoNeb 4 times a day Continue Xopenex  as you have been taken Continue rotating your doxycycline  and your cefuroxime  for 1 week every other month Continue your oxygen  as you have been using it We reviewed your CT scan of the chest.  Your pulmonary nodule stable in size and appearance.  You need a repeat CT chest in 6 months which would be February 2026. Do your best to decrease your cigarettes.  Ultimate goal will be to stop altogether. Follow-up with APP in February 2026 after your CT chest to review the results together.

## 2024-01-05 NOTE — Assessment & Plan Note (Addendum)
 Continues to have dyspnea, chronic bronchitic symptoms.  He did improve after he was treated with Augmentin  about 1 month ago.  He is no longer having any blood-tinged sputum even on his anticoagulation.  Unfortunately we will not be able to write this cycle as long as he is smoking and I do not think he is motivated to quit.  We are doing our best to suppress his symptoms.  We could consider adding Daliresp .  I did discuss initiation of prednisone  with him but he understands how that can impact his diabetes and is hesitant to do so which I do understand.  For now I will continue the same regimen.  Encouraged him to do his very best to decrease cigarettes as he is able   Please continue your Trelegy 1 inhalation once daily.  Rinse and gargle after using. Continue your DuoNeb 4 times a day Continue Xopenex  as you have been taken Continue rotating your doxycycline  and your cefuroxime  for 1 week every other month Do your best to decrease your cigarettes.  Ultimate goal will be to stop altogether. Follow-up with APP in February 2026 after your CT chest to review the results together.

## 2024-01-05 NOTE — Assessment & Plan Note (Signed)
 CT scan of the chest shows no change in the 1.2 x 1.2 cm posterior medial left upper lobe nodule.  There is some calcification present which is reassuring.  I will check another scan in 6 months and if unchanged we can probably transition him over into the lung cancer screening program.

## 2024-01-13 ENCOUNTER — Telehealth: Payer: Self-pay | Admitting: Internal Medicine

## 2024-01-13 NOTE — Telephone Encounter (Signed)
 Patient having abdominal pain. Requesting f/u call.

## 2024-01-13 NOTE — Telephone Encounter (Signed)
 Left message for pt to call back

## 2024-01-14 NOTE — Telephone Encounter (Signed)
 Pt did not return the phone call, will await further communication from pt.

## 2024-01-14 NOTE — Telephone Encounter (Signed)
 Pts wife called back and states pt has no appetite and when he does eat he has stomach upset/nausea. He is a diabetic and may need to eat but just does not feel like it and his sugar will drop. Pt scheduled to see Dr. Abran 01/24/24 at 3pm. Pt aware of appt.

## 2024-01-23 ENCOUNTER — Other Ambulatory Visit: Payer: Self-pay

## 2024-01-23 ENCOUNTER — Encounter (HOSPITAL_COMMUNITY): Payer: Self-pay

## 2024-01-23 ENCOUNTER — Inpatient Hospital Stay (HOSPITAL_COMMUNITY)
Admission: EM | Admit: 2024-01-23 | Discharge: 2024-01-27 | DRG: 291 | Disposition: A | Discharge status: Home / Self Care | Attending: Emergency Medicine | Admitting: Emergency Medicine

## 2024-01-23 ENCOUNTER — Emergency Department (HOSPITAL_COMMUNITY)

## 2024-01-23 DIAGNOSIS — Z882 Allergy status to sulfonamides status: Secondary | ICD-10-CM

## 2024-01-23 DIAGNOSIS — K222 Esophageal obstruction: Secondary | ICD-10-CM | POA: Diagnosis present

## 2024-01-23 DIAGNOSIS — F17210 Nicotine dependence, cigarettes, uncomplicated: Secondary | ICD-10-CM | POA: Diagnosis present

## 2024-01-23 DIAGNOSIS — E10319 Type 1 diabetes mellitus with unspecified diabetic retinopathy without macular edema: Secondary | ICD-10-CM | POA: Diagnosis present

## 2024-01-23 DIAGNOSIS — E861 Hypovolemia: Secondary | ICD-10-CM | POA: Diagnosis present

## 2024-01-23 DIAGNOSIS — I70203 Unspecified atherosclerosis of native arteries of extremities, bilateral legs: Secondary | ICD-10-CM | POA: Diagnosis present

## 2024-01-23 DIAGNOSIS — M7989 Other specified soft tissue disorders: Secondary | ICD-10-CM | POA: Diagnosis present

## 2024-01-23 DIAGNOSIS — M545 Low back pain, unspecified: Secondary | ICD-10-CM | POA: Diagnosis present

## 2024-01-23 DIAGNOSIS — J441 Chronic obstructive pulmonary disease with (acute) exacerbation: Secondary | ICD-10-CM | POA: Diagnosis present

## 2024-01-23 DIAGNOSIS — T380X5A Adverse effect of glucocorticoids and synthetic analogues, initial encounter: Secondary | ICD-10-CM | POA: Diagnosis present

## 2024-01-23 DIAGNOSIS — Z794 Long term (current) use of insulin: Secondary | ICD-10-CM | POA: Diagnosis not present

## 2024-01-23 DIAGNOSIS — N289 Disorder of kidney and ureter, unspecified: Secondary | ICD-10-CM | POA: Diagnosis present

## 2024-01-23 DIAGNOSIS — Z1152 Encounter for screening for COVID-19: Secondary | ICD-10-CM | POA: Diagnosis not present

## 2024-01-23 DIAGNOSIS — Z9981 Dependence on supplemental oxygen: Secondary | ICD-10-CM | POA: Diagnosis not present

## 2024-01-23 DIAGNOSIS — I5033 Acute on chronic diastolic (congestive) heart failure: Secondary | ICD-10-CM | POA: Diagnosis present

## 2024-01-23 DIAGNOSIS — E871 Hypo-osmolality and hyponatremia: Secondary | ICD-10-CM | POA: Diagnosis present

## 2024-01-23 DIAGNOSIS — G8929 Other chronic pain: Secondary | ICD-10-CM | POA: Diagnosis present

## 2024-01-23 DIAGNOSIS — E1069 Type 1 diabetes mellitus with other specified complication: Secondary | ICD-10-CM | POA: Diagnosis not present

## 2024-01-23 DIAGNOSIS — I4819 Other persistent atrial fibrillation: Secondary | ICD-10-CM | POA: Diagnosis present

## 2024-01-23 DIAGNOSIS — Z720 Tobacco use: Secondary | ICD-10-CM | POA: Diagnosis present

## 2024-01-23 DIAGNOSIS — G894 Chronic pain syndrome: Secondary | ICD-10-CM | POA: Diagnosis present

## 2024-01-23 DIAGNOSIS — D649 Anemia, unspecified: Secondary | ICD-10-CM | POA: Diagnosis not present

## 2024-01-23 DIAGNOSIS — Z8249 Family history of ischemic heart disease and other diseases of the circulatory system: Secondary | ICD-10-CM

## 2024-01-23 DIAGNOSIS — I110 Hypertensive heart disease with heart failure: Principal | ICD-10-CM | POA: Diagnosis present

## 2024-01-23 DIAGNOSIS — E10649 Type 1 diabetes mellitus with hypoglycemia without coma: Secondary | ICD-10-CM | POA: Diagnosis not present

## 2024-01-23 DIAGNOSIS — J849 Interstitial pulmonary disease, unspecified: Secondary | ICD-10-CM | POA: Diagnosis present

## 2024-01-23 DIAGNOSIS — Z8711 Personal history of peptic ulcer disease: Secondary | ICD-10-CM

## 2024-01-23 DIAGNOSIS — J209 Acute bronchitis, unspecified: Secondary | ICD-10-CM | POA: Diagnosis present

## 2024-01-23 DIAGNOSIS — Z7901 Long term (current) use of anticoagulants: Secondary | ICD-10-CM

## 2024-01-23 DIAGNOSIS — E1051 Type 1 diabetes mellitus with diabetic peripheral angiopathy without gangrene: Secondary | ICD-10-CM | POA: Diagnosis present

## 2024-01-23 DIAGNOSIS — J440 Chronic obstructive pulmonary disease with acute lower respiratory infection: Secondary | ICD-10-CM | POA: Diagnosis present

## 2024-01-23 DIAGNOSIS — I4892 Unspecified atrial flutter: Secondary | ICD-10-CM | POA: Diagnosis present

## 2024-01-23 DIAGNOSIS — Z8551 Personal history of malignant neoplasm of bladder: Secondary | ICD-10-CM

## 2024-01-23 DIAGNOSIS — Z803 Family history of malignant neoplasm of breast: Secondary | ICD-10-CM

## 2024-01-23 DIAGNOSIS — Z79899 Other long term (current) drug therapy: Secondary | ICD-10-CM

## 2024-01-23 DIAGNOSIS — J9621 Acute and chronic respiratory failure with hypoxia: Principal | ICD-10-CM | POA: Diagnosis present

## 2024-01-23 DIAGNOSIS — K2101 Gastro-esophageal reflux disease with esophagitis, with bleeding: Secondary | ICD-10-CM | POA: Diagnosis present

## 2024-01-23 DIAGNOSIS — F17290 Nicotine dependence, other tobacco product, uncomplicated: Secondary | ICD-10-CM | POA: Diagnosis present

## 2024-01-23 DIAGNOSIS — D509 Iron deficiency anemia, unspecified: Secondary | ICD-10-CM | POA: Diagnosis present

## 2024-01-23 DIAGNOSIS — E109 Type 1 diabetes mellitus without complications: Secondary | ICD-10-CM | POA: Diagnosis present

## 2024-01-23 DIAGNOSIS — Z7984 Long term (current) use of oral hypoglycemic drugs: Secondary | ICD-10-CM

## 2024-01-23 DIAGNOSIS — D72829 Elevated white blood cell count, unspecified: Secondary | ICD-10-CM | POA: Diagnosis present

## 2024-01-23 DIAGNOSIS — Z881 Allergy status to other antibiotic agents status: Secondary | ICD-10-CM

## 2024-01-23 DIAGNOSIS — I341 Nonrheumatic mitral (valve) prolapse: Secondary | ICD-10-CM | POA: Diagnosis present

## 2024-01-23 DIAGNOSIS — Z7951 Long term (current) use of inhaled steroids: Secondary | ICD-10-CM

## 2024-01-23 DIAGNOSIS — Z888 Allergy status to other drugs, medicaments and biological substances status: Secondary | ICD-10-CM

## 2024-01-23 DIAGNOSIS — Z833 Family history of diabetes mellitus: Secondary | ICD-10-CM

## 2024-01-23 LAB — COMPREHENSIVE METABOLIC PANEL WITH GFR
ALT: 12 U/L (ref 0–44)
AST: 26 U/L (ref 15–41)
Albumin: 3 g/dL — ABNORMAL LOW (ref 3.5–5.0)
Alkaline Phosphatase: 73 U/L (ref 38–126)
Anion gap: 12 (ref 5–15)
BUN: 11 mg/dL (ref 8–23)
CO2: 25 mmol/L (ref 22–32)
Calcium: 8.6 mg/dL — ABNORMAL LOW (ref 8.9–10.3)
Chloride: 80 mmol/L — ABNORMAL LOW (ref 98–111)
Creatinine, Ser: 1.03 mg/dL (ref 0.61–1.24)
GFR, Estimated: >60 mL/min (ref >60)
Glucose, Bld: 219 mg/dL — ABNORMAL HIGH (ref 70–99)
Potassium: 4.4 mmol/L (ref 3.5–5.1)
Sodium: 117 mmol/L — CL (ref 135–145)
Total Bilirubin: 1 mg/dL (ref 0.0–1.2)
Total Protein: 6.6 g/dL (ref 6.5–8.1)

## 2024-01-23 LAB — RESP PANEL BY RT-PCR (RSV, FLU A&B, COVID)  RVPGX2
Influenza A by PCR: NEGATIVE
Influenza B by PCR: NEGATIVE
Resp Syncytial Virus by PCR: NEGATIVE
SARS Coronavirus 2 by RT PCR: NEGATIVE

## 2024-01-23 LAB — BASIC METABOLIC PANEL WITH GFR
Anion gap: 11 (ref 5–15)
BUN: 11 mg/dL (ref 8–23)
CO2: 26 mmol/L (ref 22–32)
Calcium: 8.8 mg/dL — ABNORMAL LOW (ref 8.9–10.3)
Chloride: 78 mmol/L — ABNORMAL LOW (ref 98–111)
Creatinine, Ser: 1 mg/dL (ref 0.61–1.24)
GFR, Estimated: >60 mL/min (ref >60)
Glucose, Bld: 185 mg/dL — ABNORMAL HIGH (ref 70–99)
Potassium: 4 mmol/L (ref 3.5–5.1)
Sodium: 115 mmol/L — CL (ref 135–145)

## 2024-01-23 LAB — CBC WITH DIFFERENTIAL/PLATELET
Abs Immature Granulocytes: 0.04 K/uL (ref 0.00–0.07)
Basophils Absolute: 0 K/uL (ref 0.0–0.1)
Basophils Relative: 0 %
Eosinophils Absolute: 0.1 K/uL (ref 0.0–0.5)
Eosinophils Relative: 1 %
HCT: 17.4 % — ABNORMAL LOW (ref 39.0–52.0)
Hemoglobin: 5.2 g/dL — CL (ref 13.0–17.0)
Immature Granulocytes: 1 %
Lymphocytes Relative: 6 %
Lymphs Abs: 0.4 K/uL — ABNORMAL LOW (ref 0.7–4.0)
MCH: 24.6 pg — ABNORMAL LOW (ref 26.0–34.0)
MCHC: 29.9 g/dL — ABNORMAL LOW (ref 30.0–36.0)
MCV: 82.5 fL (ref 80.0–100.0)
Monocytes Absolute: 0.6 K/uL (ref 0.1–1.0)
Monocytes Relative: 10 %
Neutro Abs: 5.1 K/uL (ref 1.7–7.7)
Neutrophils Relative %: 82 %
Platelets: 228 K/uL (ref 150–400)
RBC: 2.11 MIL/uL — ABNORMAL LOW (ref 4.22–5.81)
RDW: 17.7 % — ABNORMAL HIGH (ref 11.5–15.5)
WBC: 6.2 K/uL (ref 4.0–10.5)
nRBC: 0.6 % — ABNORMAL HIGH (ref 0.0–0.2)

## 2024-01-23 LAB — IRON AND TIBC
Iron: 14 ug/dL — ABNORMAL LOW (ref 45–182)
Saturation Ratios: 4 % — ABNORMAL LOW (ref 17.9–39.5)
TIBC: 370 ug/dL (ref 250–450)
UIBC: 356 ug/dL

## 2024-01-23 LAB — I-STAT ARTERIAL BLOOD GAS, ED
Acid-Base Excess: 4 mmol/L — ABNORMAL HIGH (ref 0.0–2.0)
Bicarbonate: 29.4 mmol/L — ABNORMAL HIGH (ref 20.0–28.0)
Calcium, Ion: 1.17 mmol/L (ref 1.15–1.40)
HCT: 17 % — ABNORMAL LOW (ref 39.0–52.0)
Hemoglobin: 5.8 g/dL — CL (ref 13.0–17.0)
O2 Saturation: 100 %
Patient temperature: 97.8
Potassium: 4.2 mmol/L (ref 3.5–5.1)
Sodium: 117 mmol/L — CL (ref 135–145)
TCO2: 31 mmol/L (ref 22–32)
pCO2 arterial: 46.2 mmHg (ref 32–48)
pH, Arterial: 7.41 (ref 7.35–7.45)
pO2, Arterial: 216 mmHg — ABNORMAL HIGH (ref 83–108)

## 2024-01-23 LAB — RETICULOCYTES
Immature Retic Fract: 28.6 % — ABNORMAL HIGH (ref 2.3–15.9)
RBC.: 1.93 MIL/uL — ABNORMAL LOW (ref 4.22–5.81)
Retic Count, Absolute: 72 K/uL (ref 19.0–186.0)
Retic Ct Pct: 3.7 % — ABNORMAL HIGH (ref 0.4–3.1)

## 2024-01-23 LAB — OSMOLALITY, URINE: Osmolality, Ur: 397 mosm/kg (ref 300–900)

## 2024-01-23 LAB — OSMOLALITY: Osmolality: 256 mosm/kg — ABNORMAL LOW (ref 275–295)

## 2024-01-23 LAB — TROPONIN I (HIGH SENSITIVITY)
Troponin I (High Sensitivity): 19 ng/L — ABNORMAL HIGH (ref <18)
Troponin I (High Sensitivity): 20 ng/L — ABNORMAL HIGH (ref <18)

## 2024-01-23 LAB — I-STAT CG4 LACTIC ACID, ED: Lactic Acid, Venous: 1.6 mmol/L (ref 0.5–1.9)

## 2024-01-23 LAB — PREPARE RBC (CROSSMATCH)

## 2024-01-23 LAB — FOLATE: Folate: 9.4 ng/mL (ref >5.9)

## 2024-01-23 LAB — POC OCCULT BLOOD, ED: Fecal Occult Bld: POSITIVE — AB

## 2024-01-23 LAB — SODIUM, URINE, RANDOM: Sodium, Ur: <30 mmol/L

## 2024-01-23 LAB — FERRITIN: Ferritin: 12 ng/mL — ABNORMAL LOW (ref 24–336)

## 2024-01-23 LAB — BRAIN NATRIURETIC PEPTIDE: B Natriuretic Peptide: 730.4 pg/mL — ABNORMAL HIGH (ref 0.0–100.0)

## 2024-01-23 LAB — VITAMIN B12: Vitamin B-12: 1062 pg/mL — ABNORMAL HIGH (ref 180–914)

## 2024-01-23 LAB — CBG MONITORING, ED: Glucose-Capillary: 224 mg/dL — ABNORMAL HIGH (ref 70–99)

## 2024-01-23 MED ORDER — INSULIN ASPART 100 UNIT/ML IJ SOLN
0.0000 [IU] | Freq: Every day | INTRAMUSCULAR | Status: DC
  Administered 2024-01-23: 2 [IU] via SUBCUTANEOUS
  Administered 2024-01-24: 5 [IU] via SUBCUTANEOUS
  Administered 2024-01-26: 2 [IU] via SUBCUTANEOUS
  Filled 2024-01-23: qty 2

## 2024-01-23 MED ORDER — IPRATROPIUM-ALBUTEROL 0.5-2.5 (3) MG/3ML IN SOLN
3.0000 mL | Freq: Four times a day (QID) | RESPIRATORY_TRACT | Status: DC | PRN
  Filled 2024-01-23 (×2): qty 3

## 2024-01-23 MED ORDER — SODIUM CHLORIDE 0.9% FLUSH
3.0000 mL | Freq: Two times a day (BID) | INTRAVENOUS | Status: DC
  Administered 2024-01-23 – 2024-01-27 (×8): 3 mL via INTRAVENOUS

## 2024-01-23 MED ORDER — INSULIN GLARGINE-YFGN 100 UNIT/ML ~~LOC~~ SOLN
30.0000 [IU] | Freq: Every day | SUBCUTANEOUS | Status: DC
  Administered 2024-01-24 – 2024-01-27 (×4): 30 [IU] via SUBCUTANEOUS
  Filled 2024-01-23 (×4): qty 0.3

## 2024-01-23 MED ORDER — ACETAMINOPHEN 650 MG RE SUPP
650.0000 mg | Freq: Four times a day (QID) | RECTAL | Status: DC | PRN
Start: 2024-01-23 — End: 2024-01-27

## 2024-01-23 MED ORDER — ONDANSETRON HCL 4 MG PO TABS: 4.0000 mg | ORAL_TABLET | Freq: Four times a day (QID) | ORAL | Status: DC | PRN

## 2024-01-23 MED ORDER — METHYLPREDNISOLONE SODIUM SUCC 125 MG IJ SOLR
125.0000 mg | Freq: Once | INTRAMUSCULAR | Status: AC
  Administered 2024-01-23: 125 mg via INTRAVENOUS
  Filled 2024-01-23: qty 2

## 2024-01-23 MED ORDER — EMPAGLIFLOZIN 10 MG PO TABS
10.0000 mg | ORAL_TABLET | Freq: Every day | ORAL | Status: DC
  Administered 2024-01-25 – 2024-01-27 (×3): 10 mg via ORAL
  Filled 2024-01-23 (×3): qty 1

## 2024-01-23 MED ORDER — HYDROCODONE-ACETAMINOPHEN 10-325 MG PO TABS
0.5000 | ORAL_TABLET | Freq: Four times a day (QID) | ORAL | Status: DC | PRN
Start: 2024-01-23 — End: 2024-01-27
  Administered 2024-01-26: 1 via ORAL
  Administered 2024-01-26: 0.5 via ORAL
  Administered 2024-01-27: 1 via ORAL
  Filled 2024-01-23 (×3): qty 1

## 2024-01-23 MED ORDER — INSULIN ASPART 100 UNIT/ML IJ SOLN
0.0000 [IU] | Freq: Three times a day (TID) | INTRAMUSCULAR | Status: DC
  Administered 2024-01-24: 5 [IU] via SUBCUTANEOUS
  Administered 2024-01-24: 9 [IU] via SUBCUTANEOUS
  Administered 2024-01-24 – 2024-01-25 (×2): 5 [IU] via SUBCUTANEOUS
  Administered 2024-01-25 (×2): 2 [IU] via SUBCUTANEOUS
  Administered 2024-01-26: 3 [IU] via SUBCUTANEOUS
  Administered 2024-01-26 (×2): 1 [IU] via SUBCUTANEOUS
  Filled 2024-01-23: qty 5
  Filled 2024-01-23: qty 1
  Filled 2024-01-23: qty 3
  Filled 2024-01-23: qty 2
  Filled 2024-01-23: qty 1
  Filled 2024-01-23: qty 5
  Filled 2024-01-23: qty 9
  Filled 2024-01-23: qty 2

## 2024-01-23 MED ORDER — FUROSEMIDE 10 MG/ML IJ SOLN
40.0000 mg | Freq: Two times a day (BID) | INTRAMUSCULAR | Status: DC
  Administered 2024-01-23 – 2024-01-24 (×2): 40 mg via INTRAVENOUS
  Filled 2024-01-23 (×2): qty 4

## 2024-01-23 MED ORDER — DOXYCYCLINE HYCLATE 100 MG PO TABS
100.0000 mg | ORAL_TABLET | Freq: Two times a day (BID) | ORAL | Status: DC
  Administered 2024-01-24 – 2024-01-26 (×4): 100 mg via ORAL
  Filled 2024-01-23 (×5): qty 1

## 2024-01-23 MED ORDER — BUDESONIDE 0.25 MG/2ML IN SUSP
0.2500 mg | Freq: Two times a day (BID) | RESPIRATORY_TRACT | Status: DC
  Administered 2024-01-23 – 2024-01-27 (×8): 0.25 mg via RESPIRATORY_TRACT
  Filled 2024-01-23 (×8): qty 2

## 2024-01-23 MED ORDER — LOSARTAN POTASSIUM 50 MG PO TABS
50.0000 mg | ORAL_TABLET | Freq: Every day | ORAL | Status: DC
  Administered 2024-01-25 – 2024-01-27 (×3): 50 mg via ORAL
  Filled 2024-01-23 (×3): qty 1

## 2024-01-23 MED ORDER — ARFORMOTEROL TARTRATE 15 MCG/2ML IN NEBU
15.0000 ug | INHALATION_SOLUTION | Freq: Two times a day (BID) | RESPIRATORY_TRACT | Status: DC
  Administered 2024-01-23 – 2024-01-27 (×8): 15 ug via RESPIRATORY_TRACT
  Filled 2024-01-23 (×8): qty 2

## 2024-01-23 MED ORDER — ACETAMINOPHEN 325 MG PO TABS: 650.0000 mg | ORAL_TABLET | Freq: Four times a day (QID) | ORAL | Status: DC | PRN

## 2024-01-23 MED ORDER — GUAIFENESIN ER 600 MG PO TB12
600.0000 mg | ORAL_TABLET | Freq: Two times a day (BID) | ORAL | Status: DC
  Administered 2024-01-24 – 2024-01-27 (×6): 600 mg via ORAL
  Filled 2024-01-23 (×7): qty 1

## 2024-01-23 MED ORDER — FENTANYL CITRATE (PF) 50 MCG/ML IJ SOSY
50.0000 ug | PREFILLED_SYRINGE | Freq: Once | INTRAMUSCULAR | Status: AC
  Administered 2024-01-23: 50 ug via INTRAVENOUS
  Filled 2024-01-23: qty 1

## 2024-01-23 MED ORDER — PANTOPRAZOLE SODIUM 40 MG IV SOLR
80.0000 mg | Freq: Once | INTRAVENOUS | Status: AC
  Administered 2024-01-23: 80 mg via INTRAVENOUS
  Filled 2024-01-23: qty 20

## 2024-01-23 MED ORDER — SODIUM CHLORIDE 0.9% IV SOLUTION: Freq: Once | INTRAVENOUS | Status: DC

## 2024-01-23 MED ORDER — SENNOSIDES-DOCUSATE SODIUM 8.6-50 MG PO TABS
1.0000 | ORAL_TABLET | Freq: Every evening | ORAL | Status: DC | PRN
Start: 2024-01-23 — End: 2024-01-27

## 2024-01-23 MED ORDER — IPRATROPIUM BROMIDE 0.02 % IN SOLN
0.5000 mg | Freq: Once | RESPIRATORY_TRACT | Status: AC
  Administered 2024-01-23: 0.5 mg via RESPIRATORY_TRACT
  Filled 2024-01-23: qty 2.5

## 2024-01-23 MED ORDER — IPRATROPIUM-ALBUTEROL 0.5-2.5 (3) MG/3ML IN SOLN: 3.0000 mL | Freq: Once | RESPIRATORY_TRACT | Status: DC

## 2024-01-23 MED ORDER — METHYLPREDNISOLONE SODIUM SUCC 40 MG IJ SOLR: 40.0000 mg | Freq: Two times a day (BID) | INTRAMUSCULAR | Status: DC

## 2024-01-23 MED ORDER — BISACODYL 5 MG PO TBEC: 5.0000 mg | DELAYED_RELEASE_TABLET | Freq: Every day | ORAL | Status: DC | PRN

## 2024-01-23 MED ORDER — PANTOPRAZOLE SODIUM 40 MG IV SOLR
40.0000 mg | Freq: Two times a day (BID) | INTRAVENOUS | Status: DC
  Administered 2024-01-24 – 2024-01-27 (×7): 40 mg via INTRAVENOUS
  Filled 2024-01-23 (×7): qty 10

## 2024-01-23 MED ORDER — ALBUTEROL SULFATE (2.5 MG/3ML) 0.083% IN NEBU
10.0000 mg/h | INHALATION_SOLUTION | Freq: Once | RESPIRATORY_TRACT | Status: AC
  Administered 2024-01-23: 10 mg/h via RESPIRATORY_TRACT
  Filled 2024-01-23: qty 12

## 2024-01-23 MED ORDER — IPRATROPIUM-ALBUTEROL 0.5-2.5 (3) MG/3ML IN SOLN
3.0000 mL | RESPIRATORY_TRACT | Status: DC
  Administered 2024-01-23 – 2024-01-25 (×11): 3 mL via RESPIRATORY_TRACT
  Filled 2024-01-23 (×11): qty 3

## 2024-01-23 MED ORDER — METOPROLOL SUCCINATE ER 100 MG PO TB24
100.0000 mg | ORAL_TABLET | Freq: Two times a day (BID) | ORAL | Status: DC
  Administered 2024-01-24 – 2024-01-27 (×6): 100 mg via ORAL
  Filled 2024-01-23: qty 4
  Filled 2024-01-23 (×3): qty 1
  Filled 2024-01-23: qty 4
  Filled 2024-01-23: qty 1
  Filled 2024-01-23: qty 4

## 2024-01-23 MED ORDER — ONDANSETRON HCL 4 MG/2ML IJ SOLN: 4.0000 mg | Freq: Four times a day (QID) | INTRAMUSCULAR | Status: DC | PRN

## 2024-01-23 NOTE — Progress Notes (Signed)
   01/23/24 2226  BiPAP/CPAP/SIPAP  BiPAP/CPAP/SIPAP Pt Type Adult  BiPAP/CPAP/SIPAP SERVO  Mask Type Full face mask  Respiratory Rate 26 breaths/min  IPAP 10 cmH20  EPAP 5 cmH2O  Pressure Support 5 cmH20  PEEP 5 cmH20  FiO2 (%) 50 %  Minute Ventilation 16  Leak 14  Peak Inspiratory Pressure (PIP) 10  Tidal Volume (Vt) 599  Patient Home Machine No  Patient Home Mask No  Patient Home Tubing No  Auto Titrate No  Press High Alarm 25 cmH2O  Press Low Alarm 5 cmH2O  Device Plugged into RED Power Outlet Yes   Attempted to wean pt off of BiPAP. Pt was off for 5 minutes. Pt placed back on BiPAP for labored breathing.

## 2024-01-23 NOTE — ED Provider Notes (Signed)
 [place] EMERGENCY DEPARTMENT AT [facility] Provider Note   CSN: [id number] Arrival date & time: 01/23/24  1618     Patient presents with: Cough, Shortness of Breath, and Weakness   Thomas Sandoval. is a 68 y.o. male.   HPI 68 year old male with a history of CHF, PAD, diabetes, COPD, paroxysmal A-fib, and other comorbidities presents with shortness of breath.  History is from patient but primarily wife.  Has been short of breath for a couple weeks.  Much worse over the last 2 days.  Is having a nonproductive cough.  No fevers or chest pain.  He is also got chronic leg swelling that seems to be worse.  Prior to Admission medications   Medication Sig Start Date End Date Taking? Authorizing Provider  Accu-Chek Softclix Lancets lancets Test BS in the morning, at noon and at bedtime Dx E10.649 11/11/23   Gladis Mustard, FNP  amoxicillin -clavulanate (AUGMENTIN ) 875-125 MG tablet Take 1 tablet by mouth 2 (two) times daily. Patient not taking: Reported on 01/05/2024 12/06/23   Charley Conger, PA-C  Blood Glucose Monitoring Suppl (ACCU-CHEK GUIDE) w/Device KIT 4 (four) times daily. 10/08/23   [provider]  Blood Glucose Monitoring Suppl DEVI 1 each by Does not apply route in the morning, at noon, and at bedtime. May substitute to any manufacturer covered by patient's insurance. 10/08/23   Gladis [name], FNP  cefUROXime  (CEFTIN ) 250 MG tablet Take 250 mg by mouth as needed. 11/03/23   [provider]  doxycycline  (VIBRA -TABS) 100 MG tablet Take 100 mg by mouth as needed. 11/03/23   [provider]  empagliflozin  (JARDIANCE ) 10 MG TABS tablet Take 1 tablet (10 mg total) by mouth daily. 10/08/23   Gladis Mustard, FNP  Fluticasone -Umeclidin-Vilant (TRELEGY ELLIPTA ) 100-62.5-25 MCG/ACT AEPB INHALE 1 PUFF INTO THE LUNGS daily 10/08/23   Gladis Mustard, FNP  furosemide  (LASIX ) 40 MG tablet Take 0.5-1 tablets (20-40 mg total) by mouth See  admin instructions. 20 mg Sun Tues Thurs Sat, 40 mg Mon Wed Fri 10/08/23   Gladis Mustard, FNP  glucose blood (ACCU-CHEK GUIDE TEST) test strip Test BS in the morning, at noon and at bedtime Dx E10.649 11/10/23   Gladis Mustard, FNP  guaiFENesin  (MUCINEX ) 600 MG 12 hr tablet Take 1 tablet (600 mg total) by mouth 2 (two) times daily. Patient not taking: Reported on 01/05/2024 12/06/23   Charley Conger, PA-C  HYDROcodone -acetaminophen  (NORCO) 10-325 MG tablet Take 0.5-1 tablets by mouth every 6 (six) hours as needed for severe pain (pain score 7-10) or moderate pain (pain score 4-6). 12/31/23 01/30/24  Emeline Joesph BROCKS, DO  HYDROcodone -acetaminophen  (NORCO) 10-325 MG tablet Take 0.5-1 tablets by mouth every 6 (six) hours as needed for severe pain (pain score 7-10) or moderate pain (pain score 4-6). 01/29/24 02/28/24  Emeline Joesph C, DO  insulin  glargine (LANTUS ) 100 UNIT/ML injection INJECT 0.4 MLS (40 UNITS TOTAL) INTO THE SKIN IN THE MORNING. 10/08/23   Gladis, [name], FNP  insulin  lispro (HUMALOG ) 100 UNIT/ML injection PER SLIDING SCALE: 190 - 200 = 2 UNITS. 200-300= 4u 300 AND ABOVE = 7 UNITS. 10/11/23 10/10/24  Gladis Mustard, FNP  Insulin  Syringe-Needle U-100 (B-D INS SYR ULTRAFINE 1CC/30G) 30G X 1/2 1 ML MISC Use 4 times a day with insulin  Dx E11.9 08/22/20   Gladis Mustard, FNP  Insulin  Syringes, Disposable, U-100 1 ML MISC Use 4 times a day for insulin  injection Dx E11.9 12/27/18   Gladis, [name], FNP  ipratropium-albuterol  (DUONEB) 0.5-2.5 (3) MG/3ML  SOLN INHALE CONTENTS OF 1 VIAL VIA NEBULIZER EVERY 4 HOURS AS NEEDED 12/22/23   Byrum, Lamar RAMAN, MD  levalbuterol  (XOPENEX ) 0.31 MG/3ML nebulizer solution INHALE 3 MLS (0.31 MG TOTAL) BY NEBULIZATION EVERY 4 (FOUR) HOURS AS NEEDED FOR WHEEZING. 05/16/23   Byrum, Robert S, MD  losartan  (COZAAR ) 50 MG tablet Take 1 tablet (50 mg total) by mouth daily. 10/08/23   Gladis Mustard, FNP  metoprolol  succinate (TOPROL -XL)  100 MG 24 hr tablet Take 1 tablet (100 mg total) by mouth 2 (two) times daily. Take with or immediately following a meal. 10/08/23   Gladis, [name], FNP  omeprazole  (PRILOSEC) 40 MG capsule Take 1 capsule (40 mg total) by mouth daily. 10/08/23   Gladis, [name], FNP  OVER THE COUNTER MEDICATION Take 1-2 tablets by mouth See admin instructions. Super Beets gummies- Chew 1-2 gummies by mouth every day    [provider]  OXYGEN  Inhale 3 L/min into the lungs continuous.    [provider]  XARELTO  20 MG TABS tablet TAKE 1 TABLET BY MOUTH DAILY  WITH SUPPER 10/20/23   Kate Lonni CROME, MD    Allergies: Avelox  [moxifloxacin  hcl in nacl], Azithromycin , and Bactrim [sulfamethoxazole -trimethoprim]    Review of Systems  Constitutional:  Negative for fever.  Respiratory:  Positive for cough and shortness of breath.   Cardiovascular:  Positive for leg swelling. Negative for chest pain.    Updated Vital Signs BP 137/69   Pulse 80   Temp 98.5 F (36.9 C) (Axillary)   Resp 19   Ht 5' 10 (1.778 m)   Wt 81.6 kg   SpO2 99%   BMI 25.83 kg/m   Physical Exam Vitals and nursing note reviewed. Exam conducted with a chaperone present.  Constitutional:      Appearance: He is well-developed. He is not diaphoretic.  HENT:     Head: Normocephalic and atraumatic.  Cardiovascular:     Rate and Rhythm: Normal rate and regular rhythm.     Heart sounds: Normal heart sounds.  Pulmonary:     Effort: Tachypnea and accessory muscle usage present.     Breath sounds: Wheezing present.  Abdominal:     Palpations: Abdomen is soft.     Tenderness: There is no abdominal tenderness.  Genitourinary:    Comments: No obvious hemorrhoids.  Light brown stool on rectal exam which is Hemoccult positive Musculoskeletal:     Right lower leg: Edema present.     Left lower leg: Edema present.  Skin:    General: Skin is warm and dry.     Coloration: Skin is pale.  Neurological:      Mental Status: He is alert.     (all labs ordered are listed, but only abnormal results are displayed) Labs Reviewed  BRAIN NATRIURETIC PEPTIDE - Abnormal; Notable for the following components:      Result Value   B Natriuretic Peptide 730.4 (*)    All other components within normal limits  CBC WITH DIFFERENTIAL/PLATELET - Abnormal; Notable for the following components:   RBC 2.11 (*)    Hemoglobin 5.2 (*)    HCT 17.4 (*)    MCH 24.6 (*)    MCHC 29.9 (*)    RDW 17.7 (*)    nRBC 0.6 (*)    Lymphs Abs 0.4 (*)    All other components within normal limits  COMPREHENSIVE METABOLIC PANEL WITH GFR - Abnormal; Notable for the following components:   Sodium 117 (*)    Chloride  80 (*)    Glucose, Bld 219 (*)    Calcium 8.6 (*)    Albumin 3.0 (*)    All other components within normal limits  I-STAT ARTERIAL BLOOD GAS, ED - Abnormal; Notable for the following components:   pO2, Arterial 216 (*)    Bicarbonate 29.4 (*)    Acid-Base Excess 4.0 (*)    Sodium 117 (*)    HCT 17.0 (*)    Hemoglobin 5.8 (*)    All other components within normal limits  POC OCCULT BLOOD, ED - Abnormal; Notable for the following components:   Fecal Occult Bld POSITIVE (*)    All other components within normal limits  TROPONIN I (HIGH SENSITIVITY) - Abnormal; Notable for the following components:   Troponin I (High Sensitivity) 19 (*)    All other components within normal limits  RESP PANEL BY RT-PCR (RSV, FLU A&B, COVID)  RVPGX2  CULTURE, BLOOD (ROUTINE X 2)  CULTURE, BLOOD (ROUTINE X 2)  VITAMIN B12  FOLATE  IRON AND TIBC  FERRITIN  RETICULOCYTES  OSMOLALITY  SODIUM, URINE, RANDOM  OSMOLALITY, URINE  I-STAT CG4 LACTIC ACID, ED  TYPE AND SCREEN  PREPARE RBC (CROSSMATCH)  TROPONIN I (HIGH SENSITIVITY)    EKG: EKG Interpretation Date/Time:  Sunday January 23 2024 17:29:56 EST Ventricular Rate:  78 PR Interval:    QRS Duration:  100 QT Interval:  405 QTC Calculation: 462 R  Axis:   -66  Text Interpretation: Atrial fibrillation Ventricular premature complex Left anterior fascicular block Abnormal T, consider ischemia, lateral leads Poor data quality, interpretation may be adversely affected Confirmed by Freddi Hamilton [phone]) on 01/23/2024 5:34:08 PM  Radiology: [facility] Chest Portable 1 View Result Date: 01/23/2024 CLINICAL DATA:  Shortness of breath. EXAM: PORTABLE CHEST 1 VIEW COMPARISON:  December 03, 2022 FINDINGS: The heart size and mediastinal contours are within normal limits. Mild, stable, diffuse, chronic appearing increased interstitial lung markings are seen. A stable 8 mm ill-defined left upper lobe nodular opacity is seen, adjacent to the aortic arch. No pleural effusion or pneumothorax is identified. The visualized skeletal structures are unremarkable. IMPRESSION: 1. Stable chronic appearing increased interstitial lung markings without evidence of acute or active cardiopulmonary disease. 2. Findings consistent with the patient's known posteromedial left upper lobe pulmonary nodule. Electronically Signed   By: Suzen Dials M.D.   On: 01/23/2024 19:08     Procedures   Medications Ordered in the ED  0.9 %  sodium chloride  infusion (Manually program via Guardrails IV Fluids) (0 mLs Intravenous Hold 01/23/24 1953)  fentaNYL  (SUBLIMAZE ) injection 50 mcg (has no administration in time range)  methylPREDNISolone  sodium succinate (SOLU-MEDROL ) 125 mg/2 mL injection 125 mg (has no administration in time range)  albuterol  (PROVENTIL ) (2.5 MG/3ML) 0.083% nebulizer solution (10 mg/hr Nebulization Given 01/23/24 1750)  ipratropium (ATROVENT ) nebulizer solution 0.5 mg (0.5 mg Nebulization Given 01/23/24 1750)  pantoprazole  (PROTONIX ) injection 80 mg (80 mg Intravenous Given 01/23/24 2005)                                    Medical Decision Making Amount and/or Complexity of Data Reviewed Labs: ordered.    Details: Hemoglobin 5.8.  Sodium 117 Radiology:  ordered and independent interpretation performed.    Details: No pneumonia ECG/medicine tests: ordered and independent interpretation performed.    Details: A-fib  Risk Prescription drug management. Decision regarding hospitalization.   Patient presents with acute  dyspnea.  Has been ongoing for weeks but acutely worse.  Put on BiPAP for work of breathing and wheezing.  Doing somewhat better with breathing treatments and BiPAP.  He was also found to have significant anemia and hyponatremia on multiple lab tests.  Started on blood after verbal consent.  Does appear to have some occult GI bleeding on Hemoccult but it was not grossly melanotic or bloody.  I have messaged Dr. Rollin for GI to see in the morning.  He will be given IV Protonix .  Unclear why he is having the hyponatremia.  Does not appear acutely altered at this time.  He has some peripheral edema but no CHF on the x-ray but probably his overall volume overloaded.  He is a complex patient and will need admission.  Discussed with Dr. Tobie, who will admit.  Patient later started complaining of his chronic back pain and has not had his hydrocodone  at all today.  He is awake and alert, will give a small dose of fentanyl  to see if this helps.     Final diagnoses:  Acute on chronic respiratory failure with hypoxia (HCC)  Symptomatic anemia  Hyponatremia    ED Discharge Orders     None          Freddi Hamilton, MD 01/23/24 2016

## 2024-01-23 NOTE — ED Provider Triage Note (Signed)
 Emergency Medicine Provider Triage Evaluation Note  Thomas Sandoval. , a 68 y.o. male  was evaluated in triage.  Pt complains of worsening shortness of breath the past few days.  Worsening cough having some chest pain.  Patient is pale.  Reports Elase is chronically black stools.  Has had transfusions before but wife reports it was an adverse reaction to the medication.  No recorded fevers.  Has not been using his breathing treatments.  Reports that he has been very fatigued and did not feel like doing anything.  Patient has chronic pneumonia and has been rotating antibiotics for the past few months.  Sees pulmonology.  Review of Systems  Positive:  Negative:   Physical Exam  BP 136/71 (BP Location: Right Arm)   Pulse 83   Temp 97.7 F (36.5 C)   Resp (!) 24   Ht 5' 10 (1.778 m)   Wt 81.6 kg   SpO2 100%   BMI 25.83 kg/m  Gen:   Awake, no distress   Resp:  Normal effort  MSK:   Moves extremities without difficulty  Other:  Audible wheezing some across the room.  He has significant inspiratory and expiratory phases that are short.  Patient extremely pale with pale conjunctiva as well.  Medical Decision Making  Medically screening exam initiated at 5:04 PM.  Appropriate orders placed.  Thomas Sandoval. was informed that the remainder of the evaluation will be completed by another provider, this initial triage assessment does not replace that evaluation, and the importance of remaining in the ED until their evaluation is complete.  Charges where the patient needs from given appearance and clinical status, being brought to room 16 now.   Bernis Ernst, PA-C 01/23/24 1705

## 2024-01-23 NOTE — ED Triage Notes (Signed)
 QUICK TRIAGE: Pt to ER with c/o cough, congestion, shortness of breath and generally not feeling well.

## 2024-01-23 NOTE — ED Notes (Addendum)
 Pt unable to tolerate being off bipap, RT placed patient back on bipap. PO meds held at this time. MD Tobie made aware.

## 2024-01-23 NOTE — ED Notes (Signed)
 Called RT to trial patient off bipap.

## 2024-01-23 NOTE — ED Notes (Signed)
 Pt c/o leg cramping and discomfort MD Freddi advised and new orders placed.

## 2024-01-23 NOTE — H&P (Signed)
 History and Physical    Thomas Sandoval. FMW:[id number] DOB: 06/03/1955 DOA: 01/23/2024  PCP: Gladis Mustard, FNP  Patient coming from: Home  I have personally briefly reviewed patient's old medical records in [facility]  Chief Complaint: Cough, shortness of breath, fatigue  HPI: Thomas Sandoval. is a 68 y.o. male with medical history significant for COPD, chronic respiratory failure with hypoxia on 3 L O2 via [place] at baseline, chronic HFpEF, persistent atrial fibrillation/flutter on Xarelto , T1DM, PAD, history of perforated gastric ulcer s/p repair 2016, chronic pain, tobacco use who presented to the ED for evaluation of cough and shortness of breath.  Patient reports worsening of his chronic cough and shortness of breath over the last 2 weeks.  Cough has been productive of yellow/brown sputum.  Symptoms have significantly worsened over the last 24-48 hours.  He has home nebulizers have not provided adequate relief.  He has noticed increased swelling to his lower extremities compared to baseline.  They were planning on increasing his Lasix  to see if it helped but came to the ED instead due to his worsening symptoms.  Patient denies chest pain.  He reports good urine output at home.  He has also been feeling fatigued.  He has not noticed any obvious bleeding.  He says he does sometimes see dark stools but attributes that to taking Pepto-Bismol.  He does note that after eating he feels upset in the stomach.  He however has not had any nausea, vomiting, or diarrhea.  He reports ongoing tobacco use, has cut down to 0.5 PPD.  ED Course  Labs/Imaging on admission: I have personally reviewed following labs and imaging studies.  Initial vitals showed BP 136/71, pulse 83, RR 24, temp 97.7 F, SpO2 100% on 6 L supplemental O2 via [place].  Patient subsequently placed on BiPAP.  Labs showed WBC 6.2, hemoglobin 5.2, platelets 228, sodium 117, potassium 4.4, bicarb 25, BUN 11,  creatinine 1.03, serum glucose 219, LFTs within normal limits, BNP 730.4, troponin 19, lactic acid 1.6.  FOBT is positive.  SARS-CoV-2, influenza, RSV PCR negative.  ABG pH 7.410, pCO2 46.2, pO2 216.  Blood cultures in process.  Portable chest x-ray showed stable chronic appearing increased interstitial lung markings without evidence of acute or active cardiopulmonary disease.  Known posteromedial left upper lobe pulmonary nodule noted.  Patient was given Atrovent  nebulizer and continuous albuterol  nebulizer, IV Protonix  80 mg, IV Solu-Medrol  125 mg.  Patient was ordered to receive 2 unit PRBC transfusion.  EDP sent secure chat message to GI Dr. Rollin for routine a.m. consult.  The hospitalist service was consulted for admission.  Review of Systems: All systems reviewed and are negative except as documented in history of present illness above.   Past Medical History:  Diagnosis Date   Arthritis    Bladder cancer (HCC)    Chronic back pain    Chronic combined systolic and diastolic CHF (congestive heart failure) (HCC) 07/16/2021   COPD (chronic obstructive pulmonary disease) (HCC)    COPD with chronic bronchitis and emphysema (HCC)    DDD (degenerative disc disease)    Diabetic retinopathy of both eyes (HCC)    Essential hypertension    GERD (gastroesophageal reflux disease)    History of atrial flutter 02/07/2011   Converted to NSR with Cardizem    History of chronic bronchitis    History of hemolytic anemia 02/07/2011   secondary to Avelox    HOH (hard of hearing)    HOH (hard of  hearing)    no eardrum and nerve damage on R, also HOH on L   Mild renal insufficiency 08/25/2017   Mitral valve prolapse    a. 2D Echo 11/27/14: EF 55-60%; images were inadequate for LV wall motion assessment, + mild late systolic mitral valve prolapse involving the anterior leaflet.   PAD (peripheral artery disease) 04/09/2014   Dr Serene; bilateral SFA occlusion, R mid, L distal   PAF (paroxysmal atrial  fibrillation) (HCC)    a. Dx 11/2014 during admission for perf ulcer.   Perforated ulcer (HCC)    a. 11/2014 s/p surgery.   Productive cough    Smokers' cough (HCC)    Type 1 diabetes mellitus (HCC) 03/10/1975    Past Surgical History:  Procedure Laterality Date   BIOPSY OF SKIN SUBCUTANEOUS TISSUE AND/OR MUCOUS MEMBRANE  06/16/2023   Procedure: BIOPSY, SKIN, SUBCUTANEOUS TISSUE, OR MUCOUS MEMBRANE;  Surgeon: Abran Norleen SAILOR, MD;  Location: [facility];  Service: Gastroenterology;;   CARDIOVERSION N/A 01/20/2021   Procedure: CARDIOVERSION;  Surgeon: Okey Vina GAILS, MD;  Location: [facility] ENDOSCOPY;  Service: Cardiovascular;  Laterality: N/A;   CARDIOVERSION N/A 07/12/2023   Procedure: CARDIOVERSION;  Surgeon: Mona Vinie BROCKS, MD;  Location: [facility];  Service: Cardiovascular;  Laterality: N/A;   COLONOSCOPY WITH PROPOFOL  N/A 06/16/2023   Procedure: COLONOSCOPY WITH PROPOFOL ;  Surgeon: Abran Norleen SAILOR, MD;  Location: [facility];  Service: Gastroenterology;  Laterality: N/A;   CYSTOSCOPY WITH URETEROSCOPY Right 08/14/2013   Procedure: CYSTOSCOPY WITH URETEROSCOPY BLADDER BIOPSY ;  Surgeon: Oneil BROCKS Rafter, MD;  Location: [facility];  Service: Urology;  Laterality: Right;   ESOPHAGOGASTRODUODENOSCOPY N/A 02/06/2013   Procedure: ESOPHAGOGASTRODUODENOSCOPY (EGD);  Surgeon: Princella CHRISTELLA Nida, MD;  Location: [facility] END[facility]COPY;  Service: Endoscopy;  Laterality: N/A;   ESOPHAGOGASTRODUODENOSCOPY (EGD) WITH PROPOFOL  N/A 06/16/2023   Procedure: ESOPHAGOGASTRODUODENOSCOPY (EGD) WITH PROPOFOL ;  Surgeon: Abran Norleen SAILOR, MD;  Location: [facility];  Service: Gastroenterology;  Laterality: N/A;   LAPAROSCOPY N/A 11/25/2014   Procedure: LAPAROSCOPIC PRIMARY REPAIR OF PERFORATED PREPYLORIC ULCER WITH ARLYSS LISLE;  Surgeon: Camellia Blush, MD;  Location: [facility] OR;  Service: General;  Laterality: N/A;   TONSILLECTOMY  as child   TRANSTHORACIC ECHOCARDIOGRAM  02-17-2011   MODERATE LVH/  EF 65%   TRANSURETHRAL  RESECTION OF BLADDER TUMOR WITH GYRUS (TURBT-GYRUS) N/A 06/12/2013   Procedure: TRANSURETHRAL RESECTION OF BLADDER TUMOR WITH GYRUS (TURBT-GYRUS);  Surgeon: Mark C Ottelin, MD;  Location: [facility];  Service: Urology;  Laterality: N/A;   TYMPANIC MEMBRANE REPAIR  as child    Social History: He reports ongoing tobacco use, has cut down to 0.5 PPD.  Allergies  Allergen Reactions   Avelox  [Moxifloxacin  Hcl In Nacl] Other (See Comments)    Hemolysis  In 2012   Azithromycin  Other (See Comments) and Nausea And Vomiting    Severe stomach cramps; told to list as an allergy by dr. Orlinda ago   Bactrim [Sulfamethoxazole -Trimethoprim] Diarrhea and Nausea And Vomiting    Family History  Problem Relation Age of Onset   Breast cancer Mother    Cancer Mother        Breast   Rheumatic fever Father    Heart disease Father    Heart attack Father        Massive    Diabetes Son      Prior to Admission medications   Medication Sig Start Date End Date Taking? Authorizing Provider  Accu-Chek Softclix Lancets lancets Test BS  in the morning, at noon and at bedtime Dx E10.649 11/11/23   Gladis Mustard, FNP  amoxicillin -clavulanate (AUGMENTIN ) 875-125 MG tablet Take 1 tablet by mouth 2 (two) times daily. Patient not taking: Reported on 01/05/2024 12/06/23   Charley Conger, PA-C  Blood Glucose Monitoring Suppl (ACCU-CHEK GUIDE) w/Device KIT 4 (four) times daily. 10/08/23   [provider]  Blood Glucose Monitoring Suppl DEVI 1 each by Does not apply route in the morning, at noon, and at bedtime. May substitute to any manufacturer covered by patient's insurance. 10/08/23   Gladis [name], FNP  cefUROXime  (CEFTIN ) 250 MG tablet Take 250 mg by mouth as needed. 11/03/23   [provider]  doxycycline  (VIBRA -TABS) 100 MG tablet Take 100 mg by mouth as needed. 11/03/23   [provider]  empagliflozin  (JARDIANCE ) 10 MG TABS tablet Take 1 tablet (10 mg total) by  mouth daily. 10/08/23   Gladis Mustard, FNP  Fluticasone -Umeclidin-Vilant (TRELEGY ELLIPTA ) 100-62.5-25 MCG/ACT AEPB INHALE 1 PUFF INTO THE LUNGS daily 10/08/23   Gladis Mustard, FNP  furosemide  (LASIX ) 40 MG tablet Take 0.5-1 tablets (20-40 mg total) by mouth See admin instructions. 20 mg Sun Tues Thurs Sat, 40 mg Mon Wed Fri 10/08/23   Gladis Mustard, FNP  glucose blood (ACCU-CHEK GUIDE TEST) test strip Test BS in the morning, at noon and at bedtime Dx E10.649 11/10/23   Gladis Mustard, FNP  guaiFENesin  (MUCINEX ) 600 MG 12 hr tablet Take 1 tablet (600 mg total) by mouth 2 (two) times daily. Patient not taking: Reported on 01/05/2024 12/06/23   Charley Conger, PA-C  HYDROcodone -acetaminophen  (NORCO) 10-325 MG tablet Take 0.5-1 tablets by mouth every 6 (six) hours as needed for severe pain (pain score 7-10) or moderate pain (pain score 4-6). 12/31/23 01/30/24  Emeline Joesph BROCKS, DO  HYDROcodone -acetaminophen  (NORCO) 10-325 MG tablet Take 0.5-1 tablets by mouth every 6 (six) hours as needed for severe pain (pain score 7-10) or moderate pain (pain score 4-6). 01/29/24 02/28/24  Emeline Joesph C, DO  insulin  glargine (LANTUS ) 100 UNIT/ML injection INJECT 0.4 MLS (40 UNITS TOTAL) INTO THE SKIN IN THE MORNING. 10/08/23   Gladis, [name], FNP  insulin  lispro (HUMALOG ) 100 UNIT/ML injection PER SLIDING SCALE: 190 - 200 = 2 UNITS. 200-300= 4u 300 AND ABOVE = 7 UNITS. 10/11/23 10/10/24  Gladis Mustard, FNP  Insulin  Syringe-Needle U-100 (B-D INS SYR ULTRAFINE 1CC/30G) 30G X 1/2 1 ML MISC Use 4 times a day with insulin  Dx E11.9 08/22/20   Gladis Mustard, FNP  Insulin  Syringes, Disposable, U-100 1 ML MISC Use 4 times a day for insulin  injection Dx E11.9 12/27/18   Gladis, [name], FNP  ipratropium-albuterol  (DUONEB) 0.5-2.5 (3) MG/3ML SOLN INHALE CONTENTS OF 1 VIAL VIA NEBULIZER EVERY 4 HOURS AS NEEDED 12/22/23   Shelah Lamar RAMAN, MD  levalbuterol  (XOPENEX ) 0.31 MG/3ML nebulizer  solution INHALE 3 MLS (0.31 MG TOTAL) BY NEBULIZATION EVERY 4 (FOUR) HOURS AS NEEDED FOR WHEEZING. 05/16/23   Shelah Lamar RAMAN, MD  losartan  (COZAAR ) 50 MG tablet Take 1 tablet (50 mg total) by mouth daily. 10/08/23   Gladis Mustard, FNP  metoprolol  succinate (TOPROL -XL) 100 MG 24 hr tablet Take 1 tablet (100 mg total) by mouth 2 (two) times daily. Take with or immediately following a meal. 10/08/23   Gladis, [name], FNP  omeprazole  (PRILOSEC) 40 MG capsule Take 1 capsule (40 mg total) by mouth daily. 10/08/23   Gladis, [name], FNP  OVER THE COUNTER MEDICATION Take 1-2 tablets by mouth See admin instructions. Super Beets  gummies- Chew 1-2 gummies by mouth every day    [provider]  OXYGEN  Inhale 3 L/min into the lungs continuous.    [provider]  XARELTO  20 MG TABS tablet TAKE 1 TABLET BY MOUTH DAILY  WITH SUPPER 10/20/23   Kate Lonni CROME, MD    Physical Exam: Vitals:   01/23/24 1851 01/23/24 1904 01/23/24 1948 01/23/24 2005  BP: (!) 151/97  (!) 147/66 137/69  Pulse: 85 81 79 80  Resp: (!) 25 (!) 27 20 19   Temp:   98.4 F (36.9 C) 98.5 F (36.9 C)  TempSrc:   Axillary Axillary  SpO2: 100% 100% 99% 99%  Weight:      Height:       Constitutional: Chronically ill-appearing man resting in bed with head elevated wearing BiPAP.  Pale complexion.  NAD, calm, comfortable Eyes: EOMI, lids and conjunctivae normal ENMT: Mucous membranes are moist. Posterior pharynx clear of any exudate or lesions. Neck: normal, supple, no masses. Respiratory: Expiratory wheezing throughout the lung field. Normal respiratory effort while on BiPAP. No accessory muscle use.  Cardiovascular: Irregularly irregular, no murmurs / rubs / gallops.  +1 bilateral lower extremity edema. 2+ pedal pulses. Abdomen: Soft, no tenderness, no masses palpated. Musculoskeletal: no clubbing / cyanosis. No joint deformity upper and lower extremities. Good ROM, no contractures. Normal muscle  tone.  Skin: no rashes, lesions, ulcers. No induration Neurologic: Sensation intact. Strength 5/5 in all 4.  Psychiatric: Normal judgment and insight. Alert and oriented x 3. Normal mood.   EKG: Personally reviewed. Atrial fibrillation, rate 78, motion artifact.  Similar to previous.  Assessment/Plan Principal Problem:   Acute on chronic respiratory failure with hypoxia (HCC) Active Problems:   COPD with acute exacerbation (HCC)   Acute on chronic heart failure with preserved ejection fraction (HFpEF, >= 50%) (HCC)   Hyponatremia   Symptomatic anemia   Persistent atrial fibrillation (HCC)   Chronic back pain   Tobacco use   Type 1 diabetes mellitus (HCC)   Thomas Sandoval. is a 68 y.o. male with medical history significant for COPD, chronic respiratory failure with hypoxia on 3 L O2 via [place] at baseline, chronic HFpEF, persistent atrial fibrillation/flutter on Xarelto , T1DM, PAD, history of perforated gastric ulcer s/p repair 2016, chronic pain, tobacco use who is admitted with acute on chronic hypoxic respiratory failure due to COPD exacerbation, acute on chronic HFpEF, hyponatremia, and anemia.  Assessment and Plan: Acute on chronic respiratory failure with hypoxia due to COPD exacerbation: Patient uses 3 L O2 via [place] at all times at baseline.  Requiring BiPAP on admission due to increased work of breathing with stabilization.  Still has significant wheezing throughout the lung fields.  COVID, influenza, RSV PCR negative.  CXR without pneumonia. - IV Solu-Medrol  40 mg twice daily - Brovana/Pulmicort  twice daily, DuoNebs as needed - Continue BiPAP as needed and wean to home 3 L O2 via [place] as able - Doxycycline  - Incentive spirometer, flutter valve, Mucinex   Acute on chronic HFpEF: Patient appears volume overloaded which is likely contributing to his respiratory failure and hyponatremia.  BNP 730.  TTE 05/21/2021 showed EF improved to 55-60% from previous 30-35%. - IV Lasix  40 mg  twice daily - Continue losartan  50 mg daily - Continue Toprol -XL 100 mg twice daily - Continue Jardiance  10 mg daily - Update echocardiogram - Strict I/O's and daily weights  Hyponatremia: Sodium is 117 on admission compared to previous 135 on 11/11/2023.  As above suspect related  to volume overload. - Checking urine studies and serum osmolality - IV Lasix  as above - Follow BMET q6h x 4  Symptomatic anemia: Hemoglobin is 5.2 on admission compared to previous 11.9 on 11/11/2023.  He is on Xarelto  and has not seen any obvious bleeding.  FOBT is positive.  Has history of perforated prepyloric coarser s/p repair in 2016.  EGD by Dr. Abran in April 2025 showed recurrent risk esophagitis and esophageal stricture, deformity of duodenal bulb consistent with history of ulcer disease;  Colonoscopy was a normal exam. - IV Protonix  40 mg twice daily - Hold Xarelto  - Transfusing 2 unit PRBCs; will give Lasix  as above - Repeat CBC in a.m. - GI consult in a.m.  Persistent atrial fibrillation/flutter: Rate is currently controlled.  Holding Xarelto .  Continue Toprol -XL.  Type 1 diabetes: Placed on reduced basal Semglee  30 units daily and SSI.  Continue Jardiance .  Chronic pain syndrome: Continue home Norco 10-325 mg 0.5-1 tablet every 6 hours as needed.  PDMP reviewed and consistent with regular filling of prescriptions from same provider.  Tobacco use: Patient reports ongoing tobacco use, 0.5 PPD.  He declines nicotine  patch.   DVT prophylaxis: SCDs Start: 01/23/24 2044 Code Status: Full code, discussed with patient on admission Family Communication: Spouse at bedside Disposition Plan: From home, dispo pending clinical progress Consults called: EDP sent secure chat to GI for routine a.m. consult Severity of Illness: The appropriate patient status for this patient is INPATIENT. Inpatient status is judged to be reasonable and necessary in order to provide the required intensity of service to ensure  the patient's safety. The patient's presenting symptoms, physical exam findings, and initial radiographic and laboratory data in the context of their chronic comorbidities is felt to place them at high risk for further clinical deterioration. Furthermore, it is not anticipated that the patient will be medically stable for discharge from the hospital within 2 midnights of admission.   * I certify that at the point of admission it is my clinical judgment that the patient will require inpatient hospital care spanning beyond 2 midnights from the point of admission due to high intensity of service, high risk for further deterioration and high frequency of surveillance required.DEWAINE Jorie Blanch MD [facility]  If 7PM-7AM, please contact night-coverage [email or web address]  01/23/2024, 9:10 PM

## 2024-01-23 NOTE — Progress Notes (Signed)
 ABG critical values given to Dr. Freddi.

## 2024-01-23 NOTE — Hospital Course (Addendum)
 Thomas Latorre. is a 68 y.o. male with medical history significant for COPD, chronic respiratory failure with hypoxia on 3 L O2 via [place] at baseline, chronic HFpEF, persistent atrial fibrillation/flutter on Xarelto , T1DM, PAD, history of perforated gastric ulcer s/p repair 2016, chronic pain, tobacco use who is admitted with acute on chronic hypoxic respiratory failure due to COPD exacerbation, acute on chronic HFpEF, hyponatremia, and anemia.

## 2024-01-24 ENCOUNTER — Inpatient Hospital Stay (HOSPITAL_COMMUNITY)

## 2024-01-24 ENCOUNTER — Ambulatory Visit: Admitting: Internal Medicine

## 2024-01-24 DIAGNOSIS — Z7901 Long term (current) use of anticoagulants: Secondary | ICD-10-CM

## 2024-01-24 DIAGNOSIS — D649 Anemia, unspecified: Secondary | ICD-10-CM | POA: Diagnosis not present

## 2024-01-24 DIAGNOSIS — I5033 Acute on chronic diastolic (congestive) heart failure: Secondary | ICD-10-CM | POA: Diagnosis not present

## 2024-01-24 DIAGNOSIS — J9621 Acute and chronic respiratory failure with hypoxia: Secondary | ICD-10-CM | POA: Diagnosis not present

## 2024-01-24 DIAGNOSIS — E871 Hypo-osmolality and hyponatremia: Secondary | ICD-10-CM | POA: Diagnosis not present

## 2024-01-24 DIAGNOSIS — D509 Iron deficiency anemia, unspecified: Secondary | ICD-10-CM

## 2024-01-24 DIAGNOSIS — J441 Chronic obstructive pulmonary disease with (acute) exacerbation: Secondary | ICD-10-CM | POA: Diagnosis not present

## 2024-01-24 DIAGNOSIS — I4819 Other persistent atrial fibrillation: Secondary | ICD-10-CM

## 2024-01-24 DIAGNOSIS — E1069 Type 1 diabetes mellitus with other specified complication: Secondary | ICD-10-CM

## 2024-01-24 LAB — BASIC METABOLIC PANEL WITH GFR
Anion gap: 16 — ABNORMAL HIGH (ref 5–15)
Anion gap: 18 — ABNORMAL HIGH (ref 5–15)
BUN: 14 mg/dL (ref 8–23)
BUN: 14 mg/dL (ref 8–23)
CO2: 26 mmol/L (ref 22–32)
CO2: 28 mmol/L (ref 22–32)
Calcium: 9.2 mg/dL (ref 8.9–10.3)
Calcium: 9.4 mg/dL (ref 8.9–10.3)
Chloride: 83 mmol/L — ABNORMAL LOW (ref 98–111)
Chloride: 83 mmol/L — ABNORMAL LOW (ref 98–111)
Creatinine, Ser: 1.28 mg/dL — ABNORMAL HIGH (ref 0.61–1.24)
Creatinine, Ser: 1.36 mg/dL — ABNORMAL HIGH (ref 0.61–1.24)
GFR, Estimated: >60 mL/min (ref >60)
GFR, Estimated: 57 mL/min — ABNORMAL LOW (ref >60)
Glucose, Bld: 275 mg/dL — ABNORMAL HIGH (ref 70–99)
Glucose, Bld: 257 mg/dL — ABNORMAL HIGH (ref 70–99)
Potassium: 4.1 mmol/L (ref 3.5–5.1)
Potassium: 3.5 mmol/L (ref 3.5–5.1)
Sodium: 125 mmol/L — ABNORMAL LOW (ref 135–145)
Sodium: 129 mmol/L — ABNORMAL LOW (ref 135–145)

## 2024-01-24 LAB — TYPE AND SCREEN
ABO/RH(D): A POS
Antibody Screen: NEGATIVE
Unit division: 0
Unit division: 0

## 2024-01-24 LAB — CBC
HCT: 25.9 % — ABNORMAL LOW (ref 39.0–52.0)
Hemoglobin: 8.5 g/dL — ABNORMAL LOW (ref 13.0–17.0)
MCH: 27 pg (ref 26.0–34.0)
MCHC: 32.8 g/dL (ref 30.0–36.0)
MCV: 82.2 fL (ref 80.0–100.0)
Platelets: 220 K/uL (ref 150–400)
RBC: 3.15 MIL/uL — ABNORMAL LOW (ref 4.22–5.81)
RDW: 16.4 % — ABNORMAL HIGH (ref 11.5–15.5)
WBC: 4.4 K/uL (ref 4.0–10.5)
nRBC: 1.4 % — ABNORMAL HIGH (ref 0.0–0.2)

## 2024-01-24 LAB — CBG MONITORING, ED
Glucose-Capillary: 283 mg/dL — ABNORMAL HIGH (ref 70–99)
Glucose-Capillary: 283 mg/dL — ABNORMAL HIGH (ref 70–99)
Glucose-Capillary: 400 mg/dL — ABNORMAL HIGH (ref 70–99)
Glucose-Capillary: 357 mg/dL — ABNORMAL HIGH (ref 70–99)

## 2024-01-24 LAB — HEMOGLOBIN A1C
Hgb A1c MFr Bld: 5.6 % (ref 4.8–5.6)
Mean Plasma Glucose: 114.02 mg/dL

## 2024-01-24 LAB — ECHOCARDIOGRAM COMPLETE
Height: 70 in
MV VTI: 2.77 cm2
S' Lateral: 1.9 cm
Weight: 2880 [oz_av]

## 2024-01-24 LAB — BPAM RBC
Blood Product Expiration Date: 202512082359
Blood Product Expiration Date: 202512082359
ISSUE DATE / TIME: 202511161925
ISSUE DATE / TIME: 202511162258
Unit Type and Rh: 6200
Unit Type and Rh: 6200

## 2024-01-24 LAB — MAGNESIUM: Magnesium: 1.8 mg/dL (ref 1.7–2.4)

## 2024-01-24 MED ORDER — METHYLPREDNISOLONE SODIUM SUCC 125 MG IJ SOLR
80.0000 mg | Freq: Every day | INTRAMUSCULAR | Status: DC
  Administered 2024-01-24 – 2024-01-26 (×3): 80 mg via INTRAVENOUS
  Filled 2024-01-24 (×3): qty 2

## 2024-01-24 MED ORDER — FUROSEMIDE 10 MG/ML IJ SOLN
40.0000 mg | Freq: Every day | INTRAMUSCULAR | Status: DC
  Administered 2024-01-25 – 2024-01-26 (×2): 40 mg via INTRAVENOUS
  Filled 2024-01-24 (×2): qty 4

## 2024-01-24 MED ORDER — MORPHINE SULFATE (PF) 2 MG/ML IV SOLN
0.5000 mg | Freq: Three times a day (TID) | INTRAVENOUS | Status: DC | PRN
  Administered 2024-01-24 (×2): 0.5 mg via INTRAVENOUS
  Filled 2024-01-24 (×2): qty 1

## 2024-01-24 NOTE — Progress Notes (Signed)
 Echocardiogram 2D Echocardiogram has been performed.  Damien FALCON Lauris Serviss RDCS 01/24/2024, 1:09 PM

## 2024-01-24 NOTE — ED Notes (Signed)
 Awaiting Semglee  injection from main pharmacy

## 2024-01-24 NOTE — Progress Notes (Signed)
 [facility]   Bed Availability Yes  Level of Care Needed:  No  MD Agree to transfer:   Patient agree to transfer:   Sharolyn Batman, RN [facility]

## 2024-01-24 NOTE — Progress Notes (Signed)
 [facility]                                                                               Thomas Sandoval, is a 68 y.o. male, DOB - Mar 17, 1955, FMW:[id number] Admit date - 01/23/2024    Outpatient Primary MD for the patient is Gladis Mustard, FNP  LOS - 1  days    Brief summary   Thomas Schneck. is a 68 y.o. male with medical history significant for COPD, chronic respiratory failure with hypoxia on 3 L O2 via  at baseline, chronic HFpEF, persistent atrial fibrillation/flutter on Xarelto , T1DM, PAD, history of perforated gastric ulcer s/p repair 2016, chronic pain, tobacco use who is admitted with acute on chronic hypoxic respiratory failure due to COPD exacerbation, acute on chronic HFpEF, hyponatremia, and anemia.   Assessment & Plan   Acute on chronic respiratory failure with hypoxia due to copd exacerbation.  Patient is on 3 L of nasal cannula at home currently requiring BiPAP. Infectious workup with COVID, influenza, RSV PCR negative Chest x-ray is negative for pneumonia Patient was started on IV Solu-Medrol  continue the same Continue with Brovana/Pulmicort  and DuoNebs as needed.  Patient was started on doxycycline , for possible acute bronchitis Continue with incentive spirometry flutter valve and Mucinex . Sputum cultures if able.  Acute hyponatremia Sodium 117, 115 on admission suspect related to volume overload Slowly improving sodium is 125 this morning.  Continue to follow sodium levels Serum osmolality is 256.  Severe iron deficiency anemia Patient was admitted with a hemoglobin of 5.8, no obvious signs of bleeding. Hemoglobin improved to 8.5 after PRBC transfusion. GI consulted, recommendations to follow Transfuse to keep hemoglobin greater than 7 Anemia panel shows iron deficiency anemia, will probably need IV iron before discharge. Continue with PPI twice daily  Mild renal insufficiency Creatinine at baseline around 1 creatinine is  1.2 today. Monitor urine output, and recheck renal parameters in the morning.    Persistent atrial fibrillation/ atrial flutter On Xarelto  at home which was on hold for possible GI bleed.  Rate controlled     Type 1 Diabetes Mellitus Continue with sliding scale insulin  CBG (last 3)  Recent Labs    01/23/24 2243 01/24/24 0800 01/24/24 1201  GLUCAP 224* 283* 283*      Chronic heart failure pEF Elevated BNP, patient was started on IV Lasix  Continue to watch creatinine while on IV Lasix . Urine output has not been measured at this time.  Continue with strict intake and output and daily weights. Continue with Jardiance  10 mg daily   Hypertension Blood pressure parameters are optimal at this time   Estimated body mass index is 25.83 kg/m as calculated from the following:   Height as of this encounter: 5' 10 (1.778 m).   Weight as of this encounter: 81.6 kg.  Code Status: full code.  DVT Prophylaxis:  SCDs Start: 01/23/24 2044   Level of Care: Level of care: Progressive Family Communication:   Disposition Plan:     Remains inpatient appropriate:    Procedures:    Consultants:   Gastroenterology.   Antimicrobials:   Anti-infectives (From admission, onward)    Start  Dose/Rate Route Frequency Ordered Stop   01/23/24 2200  doxycycline  (VIBRA -TABS) tablet 100 mg        100 mg Oral Every 12 hours 01/23/24 2047          Medications  Scheduled Meds:  sodium chloride    Intravenous Once   arformoterol  15 mcg Nebulization BID   budesonide  (PULMICORT ) nebulizer solution  0.25 mg Nebulization BID   doxycycline   100 mg Oral Q12H   empagliflozin   10 mg Oral Daily   furosemide   40 mg Intravenous Q12H   guaiFENesin   600 mg Oral BID   insulin  aspart  0-5 Units Subcutaneous QHS   insulin  aspart  0-9 Units Subcutaneous TID WC   insulin  glargine-yfgn  30 Units Subcutaneous Daily   ipratropium-albuterol   3 mL Nebulization Q4H   losartan   50 mg Oral Daily    methylPREDNISolone  (SOLU-MEDROL ) injection  80 mg Intravenous Daily   metoprolol  succinate  100 mg Oral BID   pantoprazole  (PROTONIX ) IV  40 mg Intravenous Q12H   sodium chloride  flush  3 mL Intravenous Q12H   Continuous Infusions: PRN Meds:.acetaminophen  **OR** acetaminophen , bisacodyl , HYDROcodone -acetaminophen , ipratropium-albuterol , ondansetron  **OR** ondansetron  (ZOFRAN ) IV, senna-docusate    Subjective:   Thomas Sandoval was seen and examined today.  Patient wants to get off the BiPAP and eat breakfast  Objective:   Vitals:   01/24/24 0600 01/24/24 0609 01/24/24 0830 01/24/24 0945  BP: 131/62  126/67 (!) 119/55  Pulse: 88  89 92  Resp: (!) 26  14 16   Temp:  98.6 F (37 C)    TempSrc:      SpO2: 100%  100% 100%  Weight:      Height:        Intake/Output Summary (Last 24 hours) at 01/24/2024 1002 Last data filed at 01/23/2024 1949 Gross per 24 hour  Intake 323 ml  Output --  Net 323 ml   Filed Weights   01/23/24 1657  Weight: 81.6 kg     Exam General exam: Appears calm and comfortable  Respiratory system: Diminished air entry, no wheezing heard Cardiovascular system: S1 & S2 heard, RRR. No JVD,  Gastrointestinal system: Abdomen is nondistended, soft and nontender. Central nervous system: Alert and oriented.  Extremities: Symmetric 5 x 5 power. Skin: No rashes,  Psychiatry:  Mood & affect appropriate.     Data Reviewed:  I have personally reviewed following labs and imaging studies   CBC Lab Results  Component Value Date   WBC 4.4 01/24/2024   RBC 3.15 (L) 01/24/2024   HGB 8.5 (L) 01/24/2024   HCT 25.9 (L) 01/24/2024   MCV 82.2 01/24/2024   MCH 27.0 01/24/2024   PLT 220 01/24/2024   MCHC 32.8 01/24/2024   RDW 16.4 (H) 01/24/2024   LYMPHSABS 0.4 (L) 01/23/2024   MONOABS 0.6 01/23/2024   EOSABS 0.1 01/23/2024   BASOSABS 0.0 01/23/2024     Last metabolic panel Lab Results  Component Value Date   NA 125 (L) 01/24/2024   K 4.1 01/24/2024    CL 83 (L) 01/24/2024   CO2 26 01/24/2024   BUN 14 01/24/2024   CREATININE 1.28 (H) 01/24/2024   GLUCOSE 275 (H) 01/24/2024   GFRNONAA >60 01/24/2024   GFRAA 72 05/01/2020   CALCIUM 9.2 01/24/2024   PHOS 3.8 12/05/2020   PROT 6.6 01/23/2024   ALBUMIN 3.0 (L) 01/23/2024   LABGLOB 2.5 10/08/2023   AGRATIO 1.5 06/24/2022   BILITOT 1.0 01/23/2024   ALKPHOS 73 01/23/2024  AST 26 01/23/2024   ALT 12 01/23/2024   ANIONGAP 16 (H) 01/24/2024    CBG (last 3)  Recent Labs    01/23/24 2243 01/24/24 0800  GLUCAP 224* 283*      Coagulation Profile: No results for input(s): INR, PROTIME in the last 168 hours.   Radiology Studies: DG Chest Portable 1 View Result Date: 01/23/2024 CLINICAL DATA:  Shortness of breath. EXAM: PORTABLE CHEST 1 VIEW COMPARISON:  December 03, 2022 FINDINGS: The heart size and mediastinal contours are within normal limits. Mild, stable, diffuse, chronic appearing increased interstitial lung markings are seen. A stable 8 mm ill-defined left upper lobe nodular opacity is seen, adjacent to the aortic arch. No pleural effusion or pneumothorax is identified. The visualized skeletal structures are unremarkable. IMPRESSION: 1. Stable chronic appearing increased interstitial lung markings without evidence of acute or active cardiopulmonary disease. 2. Findings consistent with the patient's known posteromedial left upper lobe pulmonary nodule. Electronically Signed   By: Suzen Dials M.D.   On: 01/23/2024 19:08       Elgie Butter M.D. [facility] 01/24/2024, 10:02 AM  Available via Epic secure chat 7am-7pm After 7 pm, please refer to night coverage provider listed on amion.

## 2024-01-24 NOTE — ED Notes (Signed)
 Pt currently eating lunch tray at bedside with wife.  Pt currently on 3L nasal cannula SpO2 97%.

## 2024-01-24 NOTE — Progress Notes (Signed)
 RT trialed patient off Bipap to [place] 3L. Patient has no increased WOB or SOB at this time. Wife at bedside. RN notified.

## 2024-01-24 NOTE — ED Notes (Signed)
 Echo at bedside

## 2024-01-24 NOTE — Consult Note (Addendum)
 Consultation  Referring Provider: [facility]/ Cherlyn Primary Care Physician:  Gladis Mustard, FNP Primary Gastroenterologist:  Dr. Abran  Reason for Consultation: Profound anemia/iron deficiency/heme positive stool  HPI: Thomas Sandoval. is a 68 y.o. male with multiple serious comorbidities including COPD with chronic hypoxic respiratory failure maintained on 3 L nasal cannula at home, history of heart failure with preserved EF, atrial fibrillation/flutter on Xarelto , diabetes mellitus, peripheral arterial disease and previous remote history of perforated gastric ulcer status post repair in 2016. Patient presented to the emergency room yesterday with worsening of cough and shortness of breath over the past 2 weeks.  Productive of yellow sputum.  He had also had increase in lower extremity edema and an outpatient plan was to increase his Lasix  this week.  Workup in the emergency room pertinent for hemoglobin of 5.2 down from previous hemoglobin of 11.9 on 11/11/2023. Ferritin 12/serum iron 14/TIBC 370/iron sat 4 B12 1062 Sodium 115/potassium 4.0/BUN 11/creatinine 1.0 Respiratory panel negative Lactate 1.6 BNP 730 Stool documented Hemoccult positive  Chest x-ray with stable chronic appearing increased interstitial lung markings no evidence of acute or active cardiopulmonary disease known left upper lobe pulmonary nodule.  Patient is being treated for acute on chronic respiratory failure secondary to COPD exacerbation, now requiring BiPAP and IV Solu-Medrol [place] and Pulmicort  twice daily.  In addition diuresing with Lasix  40 twice daily and echo pending.  Patient is a poor historian, wife offers most of the history.  He apparently has had a lot of vague indigestion/abdominal discomfort type symptoms recently.  He has been on omeprazole  once daily and has been taking Pepto-Bismol as needed.  He has not noted any overt hematochezia, wife says that his stools stay dark because of the  Pepto-Bismol and they have not noticed any changes.  No current complaints of abdominal pain, no nausea or vomiting, no dysphagia or odynophagia. He did undergo recent GI evaluation in April 2025 per Dr. Abran with EGD showing recurrent mild esophagitis, and an esophageal stricture/large caliber, no Barrett's noted.  Stricture biopsied the remainder of the stomach and duodenum were unremarkable. Colonoscopy unremarkable.  Patient had a follow-up appointment with Dr. Abran today.   Past Medical History:  Diagnosis Date   Arthritis    Bladder cancer (HCC)    Chronic back pain    Chronic combined systolic and diastolic CHF (congestive heart failure) (HCC) 07/16/2021   COPD (chronic obstructive pulmonary disease) (HCC)    COPD with chronic bronchitis and emphysema (HCC)    DDD (degenerative disc disease)    Diabetic retinopathy of both eyes (HCC)    Essential hypertension    GERD (gastroesophageal reflux disease)    History of atrial flutter 02/07/2011   Converted to NSR with Cardizem    History of chronic bronchitis    History of hemolytic anemia 02/07/2011   secondary to Avelox    HOH (hard of hearing)    HOH (hard of hearing)    no eardrum and nerve damage on R, also HOH on L   Mild renal insufficiency 08/25/2017   Mitral valve prolapse    a. 2D Echo 11/27/14: EF 55-60%; images were inadequate for LV wall motion assessment, + mild late systolic mitral valve prolapse involving the anterior leaflet.   PAD (peripheral artery disease) 04/09/2014   Dr Serene; bilateral SFA occlusion, R mid, L distal   PAF (paroxysmal atrial fibrillation) (HCC)    a. Dx 11/2014 during admission for perf ulcer.   Perforated ulcer (HCC)  a. 11/2014 s/p surgery.   Productive cough    Smokers' cough (HCC)    Type 1 diabetes mellitus (HCC) 03/10/1975    Past Surgical History:  Procedure Laterality Date   BIOPSY OF SKIN SUBCUTANEOUS TISSUE AND/OR MUCOUS MEMBRANE  06/16/2023   Procedure: BIOPSY, SKIN,  SUBCUTANEOUS TISSUE, OR MUCOUS MEMBRANE;  Surgeon: Abran Norleen SAILOR, MD;  Location: [facility];  Service: Gastroenterology;;   CARDIOVERSION N/A 01/20/2021   Procedure: CARDIOVERSION;  Surgeon: Okey Vina GAILS, MD;  Location: [facility] ENDOSCOPY;  Service: Cardiovascular;  Laterality: N/A;   CARDIOVERSION N/A 07/12/2023   Procedure: CARDIOVERSION;  Surgeon: Mona Vinie BROCKS, MD;  Location: [facility] CV LAB;  Service: Cardiovascular;  Laterality: N/A;   COLONOSCOPY WITH PROPOFOL  N/A 06/16/2023   Procedure: COLONOSCOPY WITH PROPOFOL ;  Surgeon: Abran Norleen SAILOR, MD;  Location: [facility];  Service: Gastroenterology;  Laterality: N/A;   CYSTOSCOPY WITH URETEROSCOPY Right 08/14/2013   Procedure: CYSTOSCOPY WITH URETEROSCOPY BLADDER BIOPSY ;  Surgeon: Oneil BROCKS Rafter, MD;  Location: [facility];  Service: Urology;  Laterality: Right;   ESOPHAGOGASTRODUODENOSCOPY N/A 02/06/2013   Procedure: ESOPHAGOGASTRODUODENOSCOPY (EGD);  Surgeon: Princella CHRISTELLA Nida, MD;  Location: [facility] ENDOSCOPY;  Service: Endoscopy;  Laterality: N/A;   ESOPHAGOGASTRODUODENOSCOPY (EGD) WITH PROPOFOL  N/A 06/16/2023   Procedure: ESOPHAGOGASTRODUODENOSCOPY (EGD) WITH PROPOFOL ;  Surgeon: Abran Norleen SAILOR, MD;  Location: [facility];  Service: Gastroenterology;  Laterality: N/A;   LAPAROSCOPY N/A 11/25/2014   Procedure: LAPAROSCOPIC PRIMARY REPAIR OF PERFORATED PREPYLORIC ULCER WITH ARLYSS LISLE;  Surgeon: Camellia Blush, MD;  Location: [facility] OR;  Service: General;  Laterality: N/A;   TONSILLECTOMY  as child   TRANSTHORACIC ECHOCARDIOGRAM  02-17-2011   MODERATE LVH/  EF 65%   TRANSURETHRAL RESECTION OF BLADDER TUMOR WITH GYRUS (TURBT-GYRUS) N/A 06/12/2013   Procedure: TRANSURETHRAL RESECTION OF BLADDER TUMOR WITH GYRUS (TURBT-GYRUS);  Surgeon: Mark C Ottelin, MD;  Location: [facility];  Service: Urology;  Laterality: N/A;   TYMPANIC MEMBRANE REPAIR  as child    Prior to Admission medications   Medication Sig Start Date End Date Taking?  Authorizing Provider  cefUROXime  (CEFTIN ) 250 MG tablet Take 250 mg by mouth as needed. 11/03/23  Yes [provider]  doxycycline  (VIBRA -TABS) 100 MG tablet Take 100 mg by mouth as needed. 11/03/23  Yes [provider]  empagliflozin  (JARDIANCE ) 10 MG TABS tablet Take 1 tablet (10 mg total) by mouth daily. 10/08/23  Yes Gladis, [name], FNP  Fluticasone -Umeclidin-Vilant (TRELEGY ELLIPTA ) 100-62.5-25 MCG/ACT AEPB INHALE 1 PUFF INTO THE LUNGS daily 10/08/23  Yes Gladis, [name], FNP  furosemide  (LASIX ) 40 MG tablet Take 0.5-1 tablets (20-40 mg total) by mouth See admin instructions. 20 mg Sun Tues Thurs Sat, 40 mg Mon Wed Fri 10/08/23  Yes Gladis, [name], FNP  HYDROcodone -acetaminophen  (NORCO) 10-325 MG tablet Take 0.5-1 tablets by mouth every 6 (six) hours as needed for severe pain (pain score 7-10) or moderate pain (pain score 4-6). 12/31/23 01/30/24 Yes Emeline Search C, DO  insulin  glargine (LANTUS ) 100 UNIT/ML injection INJECT 0.4 MLS (40 UNITS TOTAL) INTO THE SKIN IN THE MORNING. Patient taking differently: Inject 28 Units into the skin daily. INJECT 0.4 MLS (40 UNITS TOTAL) INTO THE SKIN IN THE MORNING. 10/08/23  Yes Gladis, [name], FNP  insulin  lispro (HUMALOG ) 100 UNIT/ML injection PER SLIDING SCALE: 190 - 200 = 2 UNITS. 200-300= 4u 300 AND ABOVE = 7 UNITS. 10/11/23 10/10/24 Yes Martin, [name], FNP  ipratropium-albuterol  (DUONEB) 0.5-2.5 (3) MG/3ML SOLN INHALE CONTENTS OF 1 VIAL  VIA NEBULIZER EVERY 4 HOURS AS NEEDED 12/22/23  Yes Byrum, Lamar RAMAN, MD  levalbuterol  (XOPENEX ) 0.31 MG/3ML nebulizer solution INHALE 3 MLS (0.31 MG TOTAL) BY NEBULIZATION EVERY 4 (FOUR) HOURS AS NEEDED FOR WHEEZING. 05/16/23  Yes Byrum, Lamar RAMAN, MD  losartan  (COZAAR ) 50 MG tablet Take 1 tablet (50 mg total) by mouth daily. 10/08/23  Yes Gladis, [name], FNP  metoprolol  succinate (TOPROL -XL) 100 MG 24 hr tablet Take 1 tablet (100 mg total) by mouth 2 (two) times daily. Take with or  immediately following a meal. 10/08/23  Yes Gladis, [name], FNP  omeprazole  (PRILOSEC) 40 MG capsule Take 1 capsule (40 mg total) by mouth daily. 10/08/23  Yes Gladis, [name], FNP  OVER THE COUNTER MEDICATION Take 1-2 tablets by mouth See admin instructions. Super Beets gummies- Chew 1-2 gummies by mouth every day   Yes [provider]  OXYGEN  Inhale 3 L/min into the lungs continuous.   Yes [provider]  XARELTO  20 MG TABS tablet TAKE 1 TABLET BY MOUTH DAILY  WITH SUPPER Patient taking differently: Take 20 mg by mouth at bedtime. 10/20/23  Yes Kate Lonni CROME, MD  HYDROcodone -acetaminophen  (NORCO) 10-325 MG tablet Take 0.5-1 tablets by mouth every 6 (six) hours as needed for severe pain (pain score 7-10) or moderate pain (pain score 4-6). Patient not taking: Reported on 01/24/2024 01/29/24 02/28/24  Emeline Joesph BROCKS, DO    Current Facility-Administered Medications  Medication Dose Route Frequency Provider Last Rate Last Admin   0.9 %  sodium chloride  infusion (Manually program via Guardrails IV Fluids)   Intravenous Once Goldston, Scott, MD   Held at 01/23/24 1953   acetaminophen  (TYLENOL ) tablet 650 mg  650 mg Oral Q6H PRN Patel, Vishal R, MD       Or   acetaminophen  (TYLENOL ) suppository 650 mg  650 mg Rectal Q6H PRN Patel, Vishal R, MD       arformoterol (BROVANA) nebulizer solution 15 mcg  15 mcg Nebulization BID Patel, Vishal R, MD   15 mcg at 01/24/24 0758   bisacodyl  (DULCOLAX) EC tablet 5 mg  5 mg Oral Daily PRN Patel, Vishal R, MD       budesonide  (PULMICORT ) nebulizer solution 0.25 mg  0.25 mg Nebulization BID Patel, Vishal R, MD   0.25 mg at 01/24/24 0758   doxycycline  (VIBRA -TABS) tablet 100 mg  100 mg Oral Q12H Patel, Vishal R, MD       empagliflozin  (JARDIANCE ) tablet 10 mg  10 mg Oral Daily Patel, Vishal R, MD       furosemide  (LASIX ) injection 40 mg  40 mg Intravenous Q12H Patel, Vishal R, MD   40 mg at 01/24/24 9088   guaiFENesin  (MUCINEX )  12 hr tablet 600 mg  600 mg Oral BID Patel, Vishal R, MD       HYDROcodone -acetaminophen  (NORCO) 10-325 MG per tablet 0.5-1 tablet  0.5-1 tablet Oral Q6H PRN Patel, Vishal R, MD       insulin  aspart (novoLOG ) injection 0-5 Units  0-5 Units Subcutaneous QHS Patel, Vishal R, MD   2 Units at 01/23/24 2250   insulin  aspart (novoLOG ) injection 0-9 Units  0-9 Units Subcutaneous TID WC Patel, Vishal R, MD   5 Units at 01/24/24 0911   insulin  glargine-yfgn (SEMGLEE ) injection 30 Units  30 Units Subcutaneous Daily Tobie Bloch R, MD       ipratropium-albuterol  (DUONEB) 0.5-2.5 (3) MG/3ML nebulizer solution 3 mL  3 mL Nebulization Q6H PRN Tobie Bloch SAUNDERS, MD  ipratropium-albuterol  (DUONEB) 0.5-2.5 (3) MG/3ML nebulizer solution 3 mL  3 mL Nebulization Q4H Patel, Vishal R, MD   3 mL at 01/24/24 0758   losartan  (COZAAR ) tablet 50 mg  50 mg Oral Daily Patel, Vishal R, MD       methylPREDNISolone  sodium succinate (SOLU-MEDROL ) 125 mg/2 mL injection 80 mg  80 mg Intravenous Daily Akula, Vijaya, MD   80 mg at 01/24/24 9061   metoprolol  succinate (TOPROL -XL) 24 hr tablet 100 mg  100 mg Oral BID Patel, Vishal R, MD       ondansetron  (ZOFRAN ) tablet 4 mg  4 mg Oral Q6H PRN Patel, Vishal R, MD       Or   ondansetron  (ZOFRAN ) injection 4 mg  4 mg Intravenous Q6H PRN Patel, Vishal R, MD       pantoprazole  (PROTONIX ) injection 40 mg  40 mg Intravenous Q12H Patel, Vishal R, MD   40 mg at 01/24/24 0940   senna-docusate (Senokot-S) tablet 1 tablet  1 tablet Oral QHS PRN Patel, Vishal R, MD       sodium chloride  flush (NS) 0.9 % injection 3 mL  3 mL Intravenous Q12H Patel, Vishal R, MD   3 mL at 01/24/24 0913   Current Outpatient Medications  Medication Sig Dispense Refill   cefUROXime  (CEFTIN ) 250 MG tablet Take 250 mg by mouth as needed.     doxycycline  (VIBRA -TABS) 100 MG tablet Take 100 mg by mouth as needed.     empagliflozin  (JARDIANCE ) 10 MG TABS tablet Take 1 tablet (10 mg total) by mouth daily. 90 tablet 0    Fluticasone -Umeclidin-Vilant (TRELEGY ELLIPTA ) 100-62.5-25 MCG/ACT AEPB INHALE 1 PUFF INTO THE LUNGS daily 180 each 3   furosemide  (LASIX ) 40 MG tablet Take 0.5-1 tablets (20-40 mg total) by mouth See admin instructions. 20 mg Sun Tues Thurs Sat, 40 mg Mon Wed Fri 135 tablet 1   HYDROcodone -acetaminophen  (NORCO) 10-325 MG tablet Take 0.5-1 tablets by mouth every 6 (six) hours as needed for severe pain (pain score 7-10) or moderate pain (pain score 4-6). 120 tablet 0   insulin  glargine (LANTUS ) 100 UNIT/ML injection INJECT 0.4 MLS (40 UNITS TOTAL) INTO THE SKIN IN THE MORNING. (Patient taking differently: Inject 28 Units into the skin daily. INJECT 0.4 MLS (40 UNITS TOTAL) INTO THE SKIN IN THE MORNING.) 30 mL 0   insulin  lispro (HUMALOG ) 100 UNIT/ML injection PER SLIDING SCALE: 190 - 200 = 2 UNITS. 200-300= 4u 300 AND ABOVE = 7 UNITS. 18 mL 3   ipratropium-albuterol  (DUONEB) 0.5-2.5 (3) MG/3ML SOLN INHALE CONTENTS OF 1 VIAL VIA NEBULIZER EVERY 4 HOURS AS NEEDED 360 mL 5   levalbuterol  (XOPENEX ) 0.31 MG/3ML nebulizer solution INHALE 3 MLS (0.31 MG TOTAL) BY NEBULIZATION EVERY 4 (FOUR) HOURS AS NEEDED FOR WHEEZING. 1620 mL 1   losartan  (COZAAR ) 50 MG tablet Take 1 tablet (50 mg total) by mouth daily. 90 tablet 0   metoprolol  succinate (TOPROL -XL) 100 MG 24 hr tablet Take 1 tablet (100 mg total) by mouth 2 (two) times daily. Take with or immediately following a meal. 180 tablet 2   omeprazole  (PRILOSEC) 40 MG capsule Take 1 capsule (40 mg total) by mouth daily. 90 capsule 1   OVER THE COUNTER MEDICATION Take 1-2 tablets by mouth See admin instructions. Super Beets gummies- Chew 1-2 gummies by mouth every day     OXYGEN  Inhale 3 L/min into the lungs continuous.     XARELTO  20 MG TABS tablet TAKE 1 TABLET BY MOUTH  DAILY  WITH SUPPER (Patient taking differently: Take 20 mg by mouth at bedtime.) 100 tablet 2   [START ON 01/29/2024] HYDROcodone -acetaminophen  (NORCO) 10-325 MG tablet Take 0.5-1 tablets by  mouth every 6 (six) hours as needed for severe pain (pain score 7-10) or moderate pain (pain score 4-6). (Patient not taking: Reported on 01/24/2024) 120 tablet 0    Allergies as of 01/23/2024 - Review Complete 01/23/2024  Allergen Reaction Noted   Avelox  [moxifloxacin  hcl in nacl] Other (See Comments) 02/26/2011   Azithromycin  Other (See Comments) and Nausea And Vomiting 02/22/2011   Bactrim [sulfamethoxazole -trimethoprim] Diarrhea and Nausea And Vomiting 02/10/2013    Family History  Problem Relation Age of Onset   Breast cancer Mother    Cancer Mother        Breast   Rheumatic fever Father    Heart disease Father    Heart attack Father        Massive    Diabetes Son     Social History   Socioeconomic History   Marital status: Married    Spouse name: Not on file   Number of children: Not on file   Years of education: Not on file   Highest education level: Not on file  Occupational History   Occupation: Tobacco Visual Merchandiser  Tobacco Use   Smoking status: Every Day    Current packs/day: 0.50    Average packs/day: 0.5 packs/day for 30.0 years (15.0 ttl pk-yrs)    Types: Cigarettes   Smokeless tobacco: Former    Quit date: 06/08/1978   Tobacco comments:    Patient smokes a less than pack daily 1/2 pk 01/05/2024 NM, CMA /  Vaping Use   Vaping status: Never Used  Substance and Sexual Activity   Alcohol use: Not Currently    Comment: 5 quarts per week 12/26/21   Drug use: No   Sexual activity: Not on file  Other Topics Concern   Not on file  Social History Narrative   Lives in [place] with wife and 2 sons.    Social Drivers of Corporate Investment Banker Strain: Low Risk  (04/12/2023)   Overall Financial Resource Strain (CARDIA)    Difficulty of Paying Living Expenses: Not very hard  Food Insecurity: Low Risk  (11/24/2022)   Received from [facility]   Hunger Vital Sign    Within the past 12 months, you worried that your food would run out before you got money to buy  more: Never true    Within the past 12 months, the food you bought just didn't last and you didn't have money to get more. : Never true  Transportation Needs: No Transportation Needs (11/24/2022)   Received from [facility]    In the past 12 months, has lack of reliable transportation kept you from medical appointments, meetings, work or from getting things needed for daily living? : No  Physical Activity: Inactive (04/12/2023)   Exercise Vital Sign    Days of Exercise per Week: 0 days    Minutes of Exercise per Session: 0 min  Stress: No Stress Concern Present (04/12/2023)   [employer] of Occupational Health - Occupational Stress Questionnaire    Feeling of Stress : Not at all  Social Connections: Socially Isolated (04/12/2023)   Social Connection and Isolation Panel    Frequency of Communication with Friends and Family: Once a week    Frequency of Social Gatherings with Friends and Family: Never    Attends Religious  Services: Never    Active Member of Clubs or Organizations: No    Attends Banker Meetings: Never    Marital Status: Married  Catering Manager Violence: Not At Risk (04/12/2023)   Humiliation, Afraid, Rape, and Kick questionnaire    Fear of Current or Ex-Partner: No    Emotionally Abused: No    Physically Abused: No    Sexually Abused: No    Review of Systems: Pertinent positive and negative review of systems were noted in the above HPI section.  All other review of systems was otherwise negative.   Physical Exam: Vital signs in last 24 hours: Temp:  [97.7 F (36.5 C)-99.2 F (37.3 C)] 98.6 F (37 C) (11/17 0609) Pulse Rate:  [69-117] 89 (11/17 0830) Resp:  [14-27] 14 (11/17 0830) BP: (109-151)/(42-97) 126/67 (11/17 0830) SpO2:  [99 %-100 %] 100 % (11/17 0830) FiO2 (%):  [40 %-50 %] 50 % (11/17 0310) Weight:  [81.6 kg] 81.6 kg (11/16 1657)   General:   Alert,  Well-developed, acute and chronically ill appearing older white male  pleasant and cooperative, on BiPAP, hard of hearing, wife at bedside Head:  Normocephalic and atraumatic. Eyes:  Sclera clear, no icterus.   Conjunctiva pale. Ears:  Normal auditory acuity. Nose:  No deformity, discharge,  or lesions. Mouth:  No deformity or lesions.   Neck:  Supple; no masses or thyromegaly. Lungs: Scattered rhonchi, no wheezing  heart:  Regular rate and rhythm; no murmurs, clicks, rubs,  or gallops. Abdomen:  Soft, obese, nontender, BS active,nonpalp mass or hsm.   Rectal: Not done, documented Hemoccult positive on admission Msk:  Symmetrical without gross deformities. . Pulses:  Normal pulses noted. Extremities: Trace edema bilateral ankles feet Neurologic:  Alert and  oriented x4;  grossly normal neurologically.  Hearing Skin:  Intact without significant lesions or rashes.. Psych:  Alert and cooperative. Normal mood and affect.  Intake/Output from previous day: 11/16 0701 - 11/17 0700 In: 323 [Blood:323] Out: -  Intake/Output this shift: No intake/output data recorded.  Lab Results: Recent Labs    01/23/24 1711 01/23/24 1838 01/24/24 0700  WBC 6.2  --  4.4  HGB 5.2* 5.8* 8.5*  HCT 17.4* 17.0* 25.9*  PLT 228  --  220   BMET Recent Labs    01/23/24 1711 01/23/24 1838 01/23/24 1946 01/24/24 0700  NA 117* 117* 115* 125*  K 4.4 4.2 4.0 4.1  CL 80*  --  78* 83*  CO2 25  --  26 26  GLUCOSE 219*  --  185* 275*  BUN 11  --  11 14  CREATININE 1.03  --  1.00 1.28*  CALCIUM 8.6*  --  8.8* 9.2   LFT Recent Labs    01/23/24 1711  PROT 6.6  ALBUMIN 3.0*  AST 26  ALT 12  ALKPHOS 73  BILITOT 1.0   PT/INR No results for input(s): LABPROT, INR in the last 72 hours. Hepatitis Panel No results for input(s): HEPBSAG, HCVAB, HEPAIGM, HEPBIGM in the last 72 hours.    IMPRESSION:  #44 68 year old white male with chronic hypoxic respiratory failure secondary to COPD on chronic O2 at home at 3 L, also with congestive heart failure with  preserved EF.  Patient presented to the emergency room yesterday with worsening cough and shortness of breath noted over the past couple of weeks but acutely worsened over the previous 48 hours. BNP significantly elevated, chest x-ray did not show any evidence of pulmonary edema or pneumonia.  He is being treated for acute on chronic hypoxic respiratory failure secondary to COPD exacerbation, and also being diuresed with IV Lasix .  #2 severe anemia, iron deficient with almost 7 g drop in hemoglobin over the past 2 months.  This is in the setting of Xarelto  And has had chronically darker stools thought secondary to frequent Pepto-Bismol usage.  Procedures earlier this year with EGD and colonoscopy showed mild esophagitis and a ringlike stricture which was biopsied, no evidence for peptic ulcer disease and colonoscopy was unremarkable. Suspect small bowel source for his anemia and heme positive stool, though without any evidence of overt bleeding also need to consider underlying myelodysplastic process  #3 atrial fibs flutter-on Xarelto  now on hold #4 diabetes mellitus #5.  Peripheral arterial disease #6.  Severe hyponatremia improving #7 remote history of perforated gastric ulcer 2016 status post repair   PLAN: Will advance to heart healthy carb modified diet Continue twice daily PPI Continue to trend hemoglobin daily, and transfuse as indicated He will also benefit from IV iron He will need eventual enteroscopy/capsule endoscopy.  He is not a candidate for sedation at present with his worsened cardiopulmonary status.  Will consider procedures later in the week, if no significant improvement in pulmonary status then could consider just doing capsule endoscopy. GI will follow with you   Amy EsterwoodPA-C  01/24/2024, 9:41 AM    Attending physician's note   I have taken a history, reviewed the chart, and examined the patient. I performed a substantive portion of this encounter, including  complete performance of at least one of the key components, in conjunction with the APP. I agree with the APP's note, impression, and recommendations with my edits.   68 year old male with medical history as outlined above to include history of COPD on 3 L home O2, CHF with preserved EF, A-fib/flutter (on Xarelto ), diabetes, PAD, history of gastric ulcer with perforation in 2016, presenting with cough, SOB, LE edema and found to have profound iron deficiency anemia with heme positive stool.  GI service consulted for input on IDA.  He has had intermittent postprandial indigestion recently that he has been treating with Pepto-Bismol.  Does have dark stools with Pepto-Bismol, but otherwise has not noted overt bleeding.  Did undergo EGD/colonoscopy in 06/2023.  EGD with mild esophagitis, nonobstructing esophageal stricture, and colonoscopy unremarkable.  Admission labs notable for H/H 5.2/17.4 with good response to 2 unit RBC transfusion with repeat at 8.5/26 (baseline Hgb ~11.9).  Ferritin 12, iron 14, TIBC 370, sat 4%.  FOBT positive.  Normal renal function.  1) Iron deficiency anemia - Good response to RBC transfusion - Continue serial CBC checks with additional blood products as needed per protocol - Recommend IV iron during this admission for symptomatic IDA and history of cardiopulmonary disease - Will plan on endoscopic evaluation at some point during this admission when his cardiopulmonary status improves.  Recently had EGD and colonoscopy.  EGD was notable for luminal deformity at the duodenal bulb from prior ulcer, so VCE may need to be passed endoscopically in which case would plan for push enteroscopy with endoscopic VCE delivery  2) A-fib 3) Chronic anticoagulation 4) COPD 5) CHF 6) Acute on chronic respiratory failure - Holding Xarelto  - Diuresis per primary Hospital service - As above, will eventually need endoscopic evaluation on this admission when cardiopulmonary status  allows  Inpatient GI service will continue to follow  A total of 75 minutes of time was spent on this encounter, including in depth chart  review, independent review of results as outlined above, communicating results with the patient directly, face-to-face time with the patient, coordinating care, and ordering studies and medications as appropriate, and documentation.    [address] DO, FACG [phone] office

## 2024-01-25 ENCOUNTER — Encounter (HOSPITAL_COMMUNITY): Payer: Self-pay | Admitting: Internal Medicine

## 2024-01-25 ENCOUNTER — Other Ambulatory Visit (HOSPITAL_COMMUNITY): Payer: Self-pay

## 2024-01-25 ENCOUNTER — Telehealth (HOSPITAL_COMMUNITY): Payer: Self-pay

## 2024-01-25 DIAGNOSIS — J9621 Acute and chronic respiratory failure with hypoxia: Secondary | ICD-10-CM | POA: Diagnosis not present

## 2024-01-25 DIAGNOSIS — D509 Iron deficiency anemia, unspecified: Secondary | ICD-10-CM | POA: Diagnosis not present

## 2024-01-25 DIAGNOSIS — E871 Hypo-osmolality and hyponatremia: Secondary | ICD-10-CM | POA: Diagnosis not present

## 2024-01-25 DIAGNOSIS — D649 Anemia, unspecified: Secondary | ICD-10-CM | POA: Diagnosis not present

## 2024-01-25 DIAGNOSIS — I5033 Acute on chronic diastolic (congestive) heart failure: Secondary | ICD-10-CM | POA: Diagnosis not present

## 2024-01-25 DIAGNOSIS — J441 Chronic obstructive pulmonary disease with (acute) exacerbation: Secondary | ICD-10-CM | POA: Diagnosis not present

## 2024-01-25 LAB — BASIC METABOLIC PANEL WITH GFR
Anion gap: 16 — ABNORMAL HIGH (ref 5–15)
Anion gap: 14 (ref 5–15)
BUN: 18 mg/dL (ref 8–23)
BUN: 17 mg/dL (ref 8–23)
CO2: 30 mmol/L (ref 22–32)
CO2: 28 mmol/L (ref 22–32)
Calcium: 9 mg/dL (ref 8.9–10.3)
Calcium: 9.2 mg/dL (ref 8.9–10.3)
Chloride: 82 mmol/L — ABNORMAL LOW (ref 98–111)
Chloride: 86 mmol/L — ABNORMAL LOW (ref 98–111)
Creatinine, Ser: 1.35 mg/dL — ABNORMAL HIGH (ref 0.61–1.24)
Creatinine, Ser: 1.1 mg/dL (ref 0.61–1.24)
GFR, Estimated: 58 mL/min — ABNORMAL LOW (ref >60)
GFR, Estimated: >60 mL/min (ref >60)
Glucose, Bld: 265 mg/dL — ABNORMAL HIGH (ref 70–99)
Glucose, Bld: 163 mg/dL — ABNORMAL HIGH (ref 70–99)
Potassium: 3.7 mmol/L (ref 3.5–5.1)
Potassium: 4.2 mmol/L (ref 3.5–5.1)
Sodium: 128 mmol/L — ABNORMAL LOW (ref 135–145)
Sodium: 128 mmol/L — ABNORMAL LOW (ref 135–145)

## 2024-01-25 LAB — CBC WITH DIFFERENTIAL/PLATELET
Abs Immature Granulocytes: 0.06 K/uL (ref 0.00–0.07)
Basophils Absolute: 0 K/uL (ref 0.0–0.1)
Basophils Relative: 0 %
Eosinophils Absolute: 0 K/uL (ref 0.0–0.5)
Eosinophils Relative: 0 %
HCT: 23.8 % — ABNORMAL LOW (ref 39.0–52.0)
Hemoglobin: 7.8 g/dL — ABNORMAL LOW (ref 13.0–17.0)
Immature Granulocytes: 1 %
Lymphocytes Relative: 2 %
Lymphs Abs: 0.2 K/uL — ABNORMAL LOW (ref 0.7–4.0)
MCH: 27.3 pg (ref 26.0–34.0)
MCHC: 32.8 g/dL (ref 30.0–36.0)
MCV: 83.2 fL (ref 80.0–100.0)
Monocytes Absolute: 0.8 K/uL (ref 0.1–1.0)
Monocytes Relative: 7 %
Neutro Abs: 11 K/uL — ABNORMAL HIGH (ref 1.7–7.7)
Neutrophils Relative %: 90 %
Platelets: 233 K/uL (ref 150–400)
RBC: 2.86 MIL/uL — ABNORMAL LOW (ref 4.22–5.81)
RDW: 17 % — ABNORMAL HIGH (ref 11.5–15.5)
WBC: 12 K/uL — ABNORMAL HIGH (ref 4.0–10.5)
nRBC: 1 % — ABNORMAL HIGH (ref 0.0–0.2)

## 2024-01-25 LAB — HEMOGLOBIN AND HEMATOCRIT, BLOOD
HCT: 28.1 % — ABNORMAL LOW (ref 39.0–52.0)
Hemoglobin: 9 g/dL — ABNORMAL LOW (ref 13.0–17.0)

## 2024-01-25 LAB — GLUCOSE, CAPILLARY
Glucose-Capillary: 194 mg/dL — ABNORMAL HIGH (ref 70–99)
Glucose-Capillary: 127 mg/dL — ABNORMAL HIGH (ref 70–99)

## 2024-01-25 LAB — CBG MONITORING, ED
Glucose-Capillary: 277 mg/dL — ABNORMAL HIGH (ref 70–99)
Glucose-Capillary: 186 mg/dL — ABNORMAL HIGH (ref 70–99)

## 2024-01-25 MED ORDER — IPRATROPIUM-ALBUTEROL 0.5-2.5 (3) MG/3ML IN SOLN
3.0000 mL | Freq: Three times a day (TID) | RESPIRATORY_TRACT | Status: DC
  Administered 2024-01-25 – 2024-01-27 (×6): 3 mL via RESPIRATORY_TRACT
  Filled 2024-01-25 (×6): qty 3

## 2024-01-25 MED ORDER — INSULIN ASPART 100 UNIT/ML IJ SOLN
5.0000 [IU] | Freq: Three times a day (TID) | INTRAMUSCULAR | Status: DC
  Administered 2024-01-25 – 2024-01-27 (×3): 5 [IU] via SUBCUTANEOUS
  Filled 2024-01-25 (×3): qty 5

## 2024-01-25 NOTE — Progress Notes (Signed)
 [facility]                                                                               Thomas Sandoval, is a 68 y.o. male, DOB - 1955/10/25, FMW:[id number] Admit date - 01/23/2024    Outpatient Primary MD for the patient is Gladis Mustard, FNP  LOS - 2  days    Brief summary   Thomas Sandoval. is a 68 y.o. male with medical history significant for COPD, chronic respiratory failure with hypoxia on 3 L O2 via [place] at baseline, chronic HFpEF, persistent atrial fibrillation/flutter on Xarelto , T1DM, PAD, history of perforated gastric ulcer s/p repair 2016, chronic pain, tobacco use who is admitted with acute on chronic hypoxic respiratory failure due to COPD exacerbation, acute on chronic HFpEF, hyponatremia, and anemia.   Assessment & Plan   Acute on chronic respiratory failure with hypoxia due to copd exacerbation.  Patient is on 3 L of nasal cannula at home currently requiring BiPAP. Infectious workup with COVID, influenza, RSV PCR negative Chest x-ray is negative for pneumonia Patient was started on IV Solu-Medrol  80 mg daily , continue the same for another 24 hours.  Continue with Brovana/Pulmicort  and DuoNebs as needed.  Patient was started on doxycycline , for acute bronchitis Continue with incentive spirometry flutter valve and Mucinex . Sputum cultures if able.  Acute hyponatremia Sodium 117, 115 on admission suspect related to volume overload Sodium continues to improve from  125 to 128.  Continue to follow sodium levels Serum osmolality is 256.  Severe iron deficiency anemia Patient was admitted with a hemoglobin of 5.8, no obvious signs of bleeding. But stool for occult blood is negative.  Hemoglobin improved to 8.5 after PRBC transfusion, dropped to 7.8. continue to monitor  GI consulted, plan for capsule endoscopy.  Transfuse to keep hemoglobin greater than 7 Anemia panel shows iron deficiency anemia, will probably need IV iron before  discharge. Continue with PPI twice daily  Mild renal insufficiency Creatinine at baseline around 1 creatinine is 1.2 today. Monitor urine output, and recheck renal parameters in the morning.    Persistent atrial fibrillation/ atrial flutter On Xarelto  at home which was on hold for possible GI bleed.  Rate controlled. Continue with metoprolol  100 mg daily.    Type 1 Diabetes Mellitus Continue with sliding scale insulin . Elevated cbg's probably from steroids.  Add novolog  5 units TIDAC along with Semglee  30 units daily.  CBG (last 3)  Recent Labs    01/24/24 2158 01/25/24 0759 01/25/24 1250  GLUCAP 357* 277* 186*   Hemoglobin A1c is 5.6%    Acute on Chronic heart failure pEF Elevated BNP, patient was started on IV Lasix  Continue to watch creatinine while on IV Lasix . Urine output has not been measured at this time.  Continue with strict intake and output and daily weights. Continue with Jardiance  10 mg daily   Hypertension Blood pressure parameters are well controlled.    Estimated body mass index is 25.83 kg/m as calculated from the following:   Height as of this encounter: 5' 10 (1.778 m).   Weight as of this encounter: 81.6 kg.  Code Status: full code.  DVT  Prophylaxis:  SCDs Start: 01/23/24 2044   Level of Care: Level of care: Progressive Family Communication: none at bedside.   Disposition Plan:     Remains inpatient appropriate:  for anemia work up and copd exacerbation.   Procedures:  Echo Capsule endoscopy for tomorrow.   Consultants:   Gastroenterology.   Antimicrobials:   Anti-infectives (From admission, onward)    Start     Dose/Rate Route Frequency Ordered Stop   01/23/24 2200  doxycycline  (VIBRA -TABS) tablet 100 mg        100 mg Oral Every 12 hours 01/23/24 2047          Medications  Scheduled Meds:  sodium chloride    Intravenous Once   arformoterol  15 mcg Nebulization BID   budesonide  (PULMICORT ) nebulizer solution  0.25 mg  Nebulization BID   doxycycline   100 mg Oral Q12H   empagliflozin   10 mg Oral Daily   furosemide   40 mg Intravenous Daily   guaiFENesin   600 mg Oral BID   insulin  aspart  0-5 Units Subcutaneous QHS   insulin  aspart  0-9 Units Subcutaneous TID WC   insulin  aspart  5 Units Subcutaneous TID WC   insulin  glargine-yfgn  30 Units Subcutaneous Daily   ipratropium-albuterol   3 mL Nebulization Q4H   losartan   50 mg Oral Daily   methylPREDNISolone  (SOLU-MEDROL ) injection  80 mg Intravenous Daily   metoprolol  succinate  100 mg Oral BID   pantoprazole  (PROTONIX ) IV  40 mg Intravenous Q12H   sodium chloride  flush  3 mL Intravenous Q12H   Continuous Infusions: PRN Meds:.acetaminophen  **OR** acetaminophen , bisacodyl , HYDROcodone -acetaminophen , ipratropium-albuterol , morphine  injection, ondansetron  **OR** ondansetron  (ZOFRAN ) IV, senna-docusate    Subjective:   [name] was seen and examined today.  Patient currently on   4 to 5 lit of [place] oxygen .   Objective:   Vitals:   01/25/24 1200 01/25/24 1215 01/25/24 1230 01/25/24 1245  BP: 119/64 126/63 (!) 126/58 (!) 116/98  Pulse: 83 77 80 (!) 103  Resp: (!) 29 (!) 23 (!) 26 18  Temp:      TempSrc:      SpO2: 100% 100% 100% 95%  Weight:      Height:       No intake or output data in the 24 hours ending 01/25/24 1404  Filed Weights   01/23/24 1657  Weight: 81.6 kg     Exam General exam: Ill appearing elderly gentleman, on 4 to 5 lit [place] oxygen . Respiratory system: scattered wheezing posteriorly , diminished air entry at bases.  Cardiovascular system: S1 & S2 heard, RRR.  Gastrointestinal system: Abdomen is nondistended, soft and nontender. Central nervous system: Alert and oriented.  Extremities: no cyanosis.  Skin: No rashes,  Psychiatry:  Mood & affect appropriate.     Data Reviewed:  I have personally reviewed following labs and imaging studies   CBC Lab Results  Component Value Date   WBC 12.0 (H) 01/25/2024   RBC  2.86 (L) 01/25/2024   HGB 7.8 (L) 01/25/2024   HCT 23.8 (L) 01/25/2024   MCV 83.2 01/25/2024   MCH 27.3 01/25/2024   PLT 233 01/25/2024   MCHC 32.8 01/25/2024   RDW 17.0 (H) 01/25/2024   LYMPHSABS 0.2 (L) 01/25/2024   MONOABS 0.8 01/25/2024   EOSABS 0.0 01/25/2024   BASOSABS 0.0 01/25/2024     Last metabolic panel Lab Results  Component Value Date   NA 128 (L) 01/25/2024   K 3.7 01/25/2024   CL 82 (L) 01/25/2024  CO2 30 01/25/2024   BUN 18 01/25/2024   CREATININE 1.35 (H) 01/25/2024   GLUCOSE 265 (H) 01/25/2024   GFRNONAA 58 (L) 01/25/2024   GFRAA 72 05/01/2020   CALCIUM 9.0 01/25/2024   PHOS 3.8 12/05/2020   PROT 6.6 01/23/2024   ALBUMIN 3.0 (L) 01/23/2024   LABGLOB 2.5 10/08/2023   AGRATIO 1.5 06/24/2022   BILITOT 1.0 01/23/2024   ALKPHOS 73 01/23/2024   AST 26 01/23/2024   ALT 12 01/23/2024   ANIONGAP 16 (H) 01/25/2024    CBG (last 3)  Recent Labs    01/24/24 2158 01/25/24 0759 01/25/24 1250  GLUCAP 357* 277* 186*      Coagulation Profile: No results for input(s): INR, PROTIME in the last 168 hours.   Radiology Studies: ECHOCARDIOGRAM COMPLETE Result Date: 01/24/2024    ECHOCARDIOGRAM REPORT   Patient Name:   April Carlyon. Date of Exam: 01/24/2024 Medical Rec #:  [id number]             Height:       70.0 in Accession #:    [id number]            Weight:       180.0 lb Date of Birth:  [birth year]             BSA:          1.996 m Patient Age:    68 years              BP:           133/61 mmHg Patient Gender: M                     HR:           88 bpm. Exam Location:  Inpatient Procedure: 2D Echo, Cardiac Doppler and Color Doppler (Both Spectral and Color            Flow Doppler were utilized during procedure). Indications:    I50.9* Heart failure (unspecified)  History:        Patient has prior history of Echocardiogram examinations, most                 recent 05/21/2021. COPD, Arrythmias:Atrial Fibrillation; Risk                 Factors:Diabetes,  Hypertension and ETOH.  Sonographer:    Sherlean Dubin Sonographer#2:  Damien Senior RDCS Referring Phys: JORIE JONELLE BLANCH  Sonographer Comments: Tecnically difficult study due to COPD, patient is supine on bipap IMPRESSIONS  1. Left ventricular ejection fraction, by estimation, is 60 to 65%. The left ventricle has normal function. The left ventricle has no regional wall motion abnormalities. Left ventricular diastolic function could not be evaluated.  2. Right ventricular systolic function is normal. The right ventricular size is normal.  3. The mitral valve is normal in structure. No evidence of mitral valve regurgitation. No evidence of mitral stenosis.  4. The aortic valve is normal in structure. Aortic valve regurgitation is not visualized. No aortic stenosis is present.  5. The inferior vena cava is normal in size with greater than 50% respiratory variability, suggesting right atrial pressure of 3 mmHg. FINDINGS  Left Ventricle: Left ventricular ejection fraction, by estimation, is 60 to 65%. The left ventricle has normal function. The left ventricle has no regional wall motion abnormalities. The left ventricular internal cavity size was normal in size. There is  no left ventricular hypertrophy. Left ventricular  diastolic function could not be evaluated due to atrial fibrillation. Left ventricular diastolic function could not be evaluated. Right Ventricle: The right ventricular size is normal. No increase in right ventricular wall thickness. Right ventricular systolic function is normal. Left Atrium: Left atrial size was normal in size. Right Atrium: Right atrial size was normal in size. Pericardium: There is no evidence of pericardial effusion. Mitral Valve: The mitral valve is normal in structure. No evidence of mitral valve regurgitation. No evidence of mitral valve stenosis. MV peak gradient, 8.3 mmHg. The mean mitral valve gradient is 2.0 mmHg. Tricuspid Valve: The tricuspid valve is normal in structure.  Tricuspid valve regurgitation is not demonstrated. No evidence of tricuspid stenosis. Aortic Valve: The aortic valve is normal in structure. Aortic valve regurgitation is not visualized. No aortic stenosis is present. Pulmonic Valve: The pulmonic valve was normal in structure. Pulmonic valve regurgitation is not visualized. No evidence of pulmonic stenosis. Aorta: The aortic root is normal in size and structure. Venous: The inferior vena cava is normal in size with greater than 50% respiratory variability, suggesting right atrial pressure of 3 mmHg. IAS/Shunts: No atrial level shunt detected by color flow Doppler.  LEFT VENTRICLE PLAX 2D LVIDd:         3.10 cm LVIDs:         1.90 cm LV PW:         1.30 cm LV IVS:        1.40 cm LVOT diam:     2.20 cm LV SV:         68 LV SV Index:   34 LVOT Area:     3.80 cm  RIGHT VENTRICLE RV S prime:     10.70 cm/s TAPSE (M-mode): 1.9 cm LEFT ATRIUM             Index LA diam:        3.90 cm 1.95 cm/m LA Vol (A2C):   51.3 ml 25.70 ml/m LA Vol (A4C):   38.5 ml 19.29 ml/m LA Biplane Vol: 48.5 ml 24.30 ml/m  AORTIC VALVE LVOT Vmax:   84.60 cm/s LVOT Vmean:  57.600 cm/s LVOT VTI:    0.180 m  AORTA Ao Root diam: 3.20 cm MITRAL VALVE MV Area VTI:  2.77 cm   SHUNTS MV Peak grad: 8.3 mmHg   Systemic VTI:  0.18 m MV Mean grad: 2.0 mmHg   Systemic Diam: 2.20 cm MV Vmax:      1.44 m/s MV Vmean:     66.9 cm/s Morene Brownie Electronically signed by Morene Brownie Signature Date/Time: 01/24/2024/1:16:47 PM    Final    DG Chest Portable 1 View Result Date: 01/23/2024 CLINICAL DATA:  Shortness of breath. EXAM: PORTABLE CHEST 1 VIEW COMPARISON:  December 03, 2022 FINDINGS: The heart size and mediastinal contours are within normal limits. Mild, stable, diffuse, chronic appearing increased interstitial lung markings are seen. A stable 8 mm ill-defined left upper lobe nodular opacity is seen, adjacent to the aortic arch. No pleural effusion or pneumothorax is identified. The visualized  skeletal structures are unremarkable. IMPRESSION: 1. Stable chronic appearing increased interstitial lung markings without evidence of acute or active cardiopulmonary disease. 2. Findings consistent with the patient's known posteromedial left upper lobe pulmonary nodule. Electronically Signed   By: Suzen Dials M.D.   On: 01/23/2024 19:08       Elgie Butter M.D. [facility] 01/25/2024, 2:04 PM  Available via Epic secure chat 7am-7pm After 7 pm, please refer to night  coverage provider listed on amion.

## 2024-01-25 NOTE — Telephone Encounter (Signed)
 Pharmacy Patient Advocate Encounter  Insurance verification completed.    The patient is insured through [facility]. Patient has Medicare and is not eligible for a copay card, but may be able to apply for patient assistance or Medicare RX Payment Plan (Patient Must reach out to their plan, if eligible for payment plan), if available.    Ran test claim for Jardiance  10mg  and it is refill too soon until 01/30/24   This test claim was processed through [facility]- copay amounts may vary at other pharmacies due to pharmacy/plan contracts, or as the patient moves through the different stages of their insurance plan.

## 2024-01-25 NOTE — Inpatient Diabetes Management (Signed)
 Inpatient Diabetes Program Recommendations  AACE/ADA: New Consensus Statement on Inpatient Glycemic Control (2015)  Target Ranges:  Prepandial:   less than 140 mg/dL      Peak postprandial:   less than 180 mg/dL (1-2 hours)      Critically ill patients:  140 - 180 mg/dL   Lab Results  Component Value Date   GLUCAP 277 (H) 01/25/2024   HGBA1C 5.6 01/24/2024    Review of Glycemic Control  Latest Reference Range & Units 01/24/24 21:58 01/25/24 07:59  Glucose-Capillary 70 - 99 mg/dL 642 (H) 722 (H)  (H): Data is abnormally high Diabetes history: Type 2DM Outpatient Diabetes medications: Lantus  28 units every day, Humalog  SSI TID, Jardiance  10 mg QD Current orders for Inpatient glycemic control: Semglee  30 units every day, Novolog  0-9 units TID & HS Solumedrol 80 mg QD  Inpatient Diabetes Program Recommendations:    Consider increasing Semglee  40 units every day and adding Novolog  6 units TID (assuming patient is consuming >50% of meals)   Thanks, Tinnie Minus, MSN, RNC-OB Diabetes Coordinator [phone] (8a-5p)

## 2024-01-25 NOTE — Progress Notes (Addendum)
 Patient ID: Thomas Sandoval., male   DOB: [birth year], 68 y.o.   MRN: [id number]    Progress Note   Subjective   Day # 2 CC; profound anemia, iron deficiency, heme positive stool Patient presenting with worsening cough and shortness of breath x 2 weeks.  Patient remained in the emergency room overnight, unhappy today that he is still waiting for bed He does feel better, says he is breathing better but not back to his baseline. No current GI complaints, no melena, no abdominal pain, no nausea, tolerating solid diet  Labs today WBC 12.0/hemoglobin 7.8/hematocrit 23.8 down from 8.5 yesterday post 2 units. Sodium 128/BUN 18/creatinine 1.35   Objective   Vital signs in last 24 hours: Temp:  [97.8 F (36.6 C)-98.6 F (37 C)] 98.4 F (36.9 C) (11/18 0808) Pulse Rate:  [85-115] 87 (11/18 0457) Resp:  [16-25] 18 (11/18 0457) BP: (110-135)/(50-70) 126/60 (11/18 0457) SpO2:  [95 %-100 %] 100 % (11/18 0457) FiO2 (%):  [50 %] 50 % (11/17 1155)   General:    Older white male in NAD, wife at bedside Heart:  Regular rate and rhythm; no murmurs Lungs: Respirations even and unlabored, no wheezing, on O2 Abdomen:  Soft, nontender and nondistended. Normal bowel sounds. Extremities:  Without edema. Neurologic:  Alert and oriented,  grossly normal neurologically.  Hard of hearing Psych:  Cooperative. Normal mood and affect.  Intake/Output from previous day: No intake/output data recorded. Intake/Output this shift: No intake/output data recorded.  Lab Results: Recent Labs    01/23/24 1711 01/23/24 1838 01/24/24 0700 01/25/24 0239  WBC 6.2  --  4.4 12.0*  HGB 5.2* 5.8* 8.5* 7.8*  HCT 17.4* 17.0* 25.9* 23.8*  PLT 228  --  220 233   BMET Recent Labs    01/24/24 0700 01/24/24 1227 01/25/24 0806  NA 125* 129* 128*  K 4.1 3.5 3.7  CL 83* 83* 82*  CO2 26 28 30   GLUCOSE 275* 257* 265*  BUN 14 14 18   CREATININE 1.28* 1.36* 1.35*  CALCIUM 9.2 9.4 9.0   LFT Recent Labs     01/23/24 1711  PROT 6.6  ALBUMIN 3.0*  AST 26  ALT 12  ALKPHOS 73  BILITOT 1.0   PT/INR No results for input(s): LABPROT, INR in the last 72 hours.  Studies/Results: ECHOCARDIOGRAM COMPLETE Result Date: 01/24/2024    ECHOCARDIOGRAM REPORT   Patient Name:   Thomas Trevino. Date of Exam: 01/24/2024 Medical Rec #:  [id number]             Height:       70.0 in Accession #:    [id number]            Weight:       180.0 lb Date of Birth:  Apr 16, 1955             BSA:          1.996 m Patient Age:    68 years              BP:           133/61 mmHg Patient Gender: M                     HR:           88 bpm. Exam Location:  Inpatient Procedure: 2D Echo, Cardiac Doppler and Color Doppler (Both Spectral and Color  Flow Doppler were utilized during procedure). Indications:    I50.9* Heart failure (unspecified)  History:        Patient has prior history of Echocardiogram examinations, most                 recent 05/21/2021. COPD, Arrythmias:Atrial Fibrillation; Risk                 Factors:Diabetes, Hypertension and ETOH.  Sonographer:    Sherlean Dubin Sonographer#2:  Damien Senior RDCS Referring Phys: JORIE JONELLE BLANCH  Sonographer Comments: Tecnically difficult study due to COPD, patient is supine on bipap IMPRESSIONS  1. Left ventricular ejection fraction, by estimation, is 60 to 65%. The left ventricle has normal function. The left ventricle has no regional wall motion abnormalities. Left ventricular diastolic function could not be evaluated.  2. Right ventricular systolic function is normal. The right ventricular size is normal.  3. The mitral valve is normal in structure. No evidence of mitral valve regurgitation. No evidence of mitral stenosis.  4. The aortic valve is normal in structure. Aortic valve regurgitation is not visualized. No aortic stenosis is present.  5. The inferior vena cava is normal in size with greater than 50% respiratory variability, suggesting right atrial pressure of 3  mmHg. FINDINGS  Left Ventricle: Left ventricular ejection fraction, by estimation, is 60 to 65%. The left ventricle has normal function. The left ventricle has no regional wall motion abnormalities. The left ventricular internal cavity size was normal in size. There is  no left ventricular hypertrophy. Left ventricular diastolic function could not be evaluated due to atrial fibrillation. Left ventricular diastolic function could not be evaluated. Right Ventricle: The right ventricular size is normal. No increase in right ventricular wall thickness. Right ventricular systolic function is normal. Left Atrium: Left atrial size was normal in size. Right Atrium: Right atrial size was normal in size. Pericardium: There is no evidence of pericardial effusion. Mitral Valve: The mitral valve is normal in structure. No evidence of mitral valve regurgitation. No evidence of mitral valve stenosis. MV peak gradient, 8.3 mmHg. The mean mitral valve gradient is 2.0 mmHg. Tricuspid Valve: The tricuspid valve is normal in structure. Tricuspid valve regurgitation is not demonstrated. No evidence of tricuspid stenosis. Aortic Valve: The aortic valve is normal in structure. Aortic valve regurgitation is not visualized. No aortic stenosis is present. Pulmonic Valve: The pulmonic valve was normal in structure. Pulmonic valve regurgitation is not visualized. No evidence of pulmonic stenosis. Aorta: The aortic root is normal in size and structure. Venous: The inferior vena cava is normal in size with greater than 50% respiratory variability, suggesting right atrial pressure of 3 mmHg. IAS/Shunts: No atrial level shunt detected by color flow Doppler.  LEFT VENTRICLE PLAX 2D LVIDd:         3.10 cm LVIDs:         1.90 cm LV PW:         1.30 cm LV IVS:        1.40 cm LVOT diam:     2.20 cm LV SV:         68 LV SV Index:   34 LVOT Area:     3.80 cm  RIGHT VENTRICLE RV S prime:     10.70 cm/s TAPSE (M-mode): 1.9 cm LEFT ATRIUM             Index  LA diam:        3.90 cm 1.95 cm/m LA Vol (A2C):  51.3 ml 25.70 ml/m LA Vol (A4C):   38.5 ml 19.29 ml/m LA Biplane Vol: 48.5 ml 24.30 ml/m  AORTIC VALVE LVOT Vmax:   84.60 cm/s LVOT Vmean:  57.600 cm/s LVOT VTI:    0.180 m  AORTA Ao Root diam: 3.20 cm MITRAL VALVE MV Area VTI:  2.77 cm   SHUNTS MV Peak grad: 8.3 mmHg   Systemic VTI:  0.18 m MV Mean grad: 2.0 mmHg   Systemic Diam: 2.20 cm MV Vmax:      1.44 m/s MV Vmean:     66.9 cm/s Morene Brownie Electronically signed by Morene Brownie Signature Date/Time: 01/24/2024/1:16:47 PM    Final    DG Chest Portable 1 View Result Date: 01/23/2024 CLINICAL DATA:  Shortness of breath. EXAM: PORTABLE CHEST 1 VIEW COMPARISON:  December 03, 2022 FINDINGS: The heart size and mediastinal contours are within normal limits. Mild, stable, diffuse, chronic appearing increased interstitial lung markings are seen. A stable 8 mm ill-defined left upper lobe nodular opacity is seen, adjacent to the aortic arch. No pleural effusion or pneumothorax is identified. The visualized skeletal structures are unremarkable. IMPRESSION: 1. Stable chronic appearing increased interstitial lung markings without evidence of acute or active cardiopulmonary disease. 2. Findings consistent with the patient's known posteromedial left upper lobe pulmonary nodule. Electronically Signed   By: Suzen Dials M.D.   On: 01/23/2024 19:08       Assessment / Plan:    #48 68 year old white male admitted after he presented to the emergency room yesterday with progressive shortness of breath over the past couple of weeks acutely worsening within the previous 48 hours.  He had also noted increase in lower extremity edema. BNP significantly elevated, chest x-ray did not show any evidence of pneumonia or pulmonary edema Respiratory panel negative He is being managed for acute on chronic hypoxic respiratory failure secondary to COPD exacerbation-baseline on 3 L nasal cannula at home, and also being  diuresed with IV Lasix .  He is improving from a cardiopulmonary standpoint  #2 severe anemia, iron deficient with almost 7 g drop in hemoglobin over the past 2 months he has noticed dark stools at home in the setting of Xarelto  but had also been taking Pepto-Bismol regularly. Patient had undergone EGD and colonoscopy earlier this year unrevealing as to any potential sources for more recent acute GI blood loss.  There was some luminal deformity of the duodenal bulb noted from previous ulcer disease. Suspect small bowel source for his anemia and heme positive stool  #3 history of A-fib/flutter on Xarelto  currently on hold #4 diabetes mellitus #5.  Peripheral arterial disease #6.  Severe hyponatremia improved #7.  Remote history of perforated gastric ulcer 2016 status post repair  Plan; continue to hold Xarelto  Continue to trend hemoglobin and transfuse as indicated Discussed proceeding with capsule endoscopy with the patient and wife at bedside this morning.  They are in favor of attempting capsule endoscopy alone.  Aware that there is a possibility with the angulation of the duodenum that the capsule may not pass however if we are able to have the capsule pass he may be able to avoid sedation. Will change to clear liquids for the rest of the day, n.p.o. after midnight and scheduled for capsule endoscopy tomorrow a.m.  Images will be reviewed the following day.  GI will continue to follow with you    Principal Problem:   Acute on chronic respiratory failure with hypoxia (HCC) Active Problems:   COPD with acute exacerbation (  HCC)   Symptomatic anemia   Chronic back pain   Hyponatremia   Tobacco use   Persistent atrial fibrillation (HCC)   Acute on chronic heart failure with preserved ejection fraction (HFpEF, >= 50%) (HCC)   Type 1 diabetes mellitus (HCC)   Chronic anticoagulation     LOS: 2 days   Amy Esterwood PA-C 01/25/2024, 10:00 AM   Attending physician's note   I have  taken a history, reviewed the chart, and examined the patient. I performed a substantive portion of this encounter, including complete performance of at least one of the key components, in conjunction with the APP. I agree with the APP's note, impression, and recommendations with my edits.   No overt bleeding.  Good response to RBC transfusion and H/H likely leveling off at 7.8/24.  Hemoglobin 8.5 was likely an overshoot.  States that he is starting to feel little better from a pulmonary standpoint.  Discussed the role of VCE to include oral VCE vs endoscopic placement.  Given the acute angulation on prior endoscopy, there certainly is a chance that the capsule does not pass beyond the duodenal bulb.  However, given his acute on chronic respiratory failure it would likely still be a few days before he is optimized for a sedating procedure.  He would prefer to give VCE a try in the meantime, and if retained in the stomach or duodenal bulb, would plan on endoscopic retrieval with replacement of a new VCE distal to the angulation (albeit a technically challenging procedure).  - Holding Xarelto  - Clears today with plan for VCE tomorrow - Continue trending serial CBCs with additional blood products as needed per protocol  The indications, risks, and benefits of Video Capsule Endoscopy were explained to the patient in detail. Risks include but are not limited to capsule retention requiring endoscopic or surgical retrieval (occurs in 1-2%), failure to reach cecum prior to capsule battery expiration (ie, incomplete study), or inadequate visualization (ie, non-diagnostic study). The patient verbalized understanding and wished to proceed. All questions answered, referred to scheduler. Further recommendations pending results of the exam.    Jericha Bryden, DO, FACG [phone] office

## 2024-01-26 ENCOUNTER — Encounter (HOSPITAL_COMMUNITY): Admission: EM | Disposition: A | Payer: Self-pay | Source: Home / Self Care | Attending: Internal Medicine

## 2024-01-26 DIAGNOSIS — J9621 Acute and chronic respiratory failure with hypoxia: Secondary | ICD-10-CM | POA: Diagnosis not present

## 2024-01-26 HISTORY — PX: GIVENS CAPSULE STUDY: SHX5432

## 2024-01-26 LAB — MISC LABCORP TEST (SEND OUT): Labcorp test code: 83935

## 2024-01-26 LAB — GLUCOSE, CAPILLARY
Glucose-Capillary: 127 mg/dL — ABNORMAL HIGH (ref 70–99)
Glucose-Capillary: 138 mg/dL — ABNORMAL HIGH (ref 70–99)
Glucose-Capillary: 160 mg/dL — ABNORMAL HIGH (ref 70–99)
Glucose-Capillary: 228 mg/dL — ABNORMAL HIGH (ref 70–99)
Glucose-Capillary: 231 mg/dL — ABNORMAL HIGH (ref 70–99)

## 2024-01-26 LAB — CBC WITH DIFFERENTIAL/PLATELET
Abs Immature Granulocytes: 0.04 K/uL (ref 0.00–0.07)
Basophils Absolute: 0 K/uL (ref 0.0–0.1)
Basophils Relative: 0 %
Eosinophils Absolute: 0 K/uL (ref 0.0–0.5)
Eosinophils Relative: 0 %
HCT: 25.9 % — ABNORMAL LOW (ref 39.0–52.0)
Hemoglobin: 8.2 g/dL — ABNORMAL LOW (ref 13.0–17.0)
Immature Granulocytes: 0 %
Lymphocytes Relative: 2 %
Lymphs Abs: 0.3 K/uL — ABNORMAL LOW (ref 0.7–4.0)
MCH: 27 pg (ref 26.0–34.0)
MCHC: 31.7 g/dL (ref 30.0–36.0)
MCV: 85.2 fL (ref 80.0–100.0)
Monocytes Absolute: 0.6 K/uL (ref 0.1–1.0)
Monocytes Relative: 5 %
Neutro Abs: 11.6 K/uL — ABNORMAL HIGH (ref 1.7–7.7)
Neutrophils Relative %: 93 %
Platelets: 219 K/uL (ref 150–400)
RBC: 3.04 MIL/uL — ABNORMAL LOW (ref 4.22–5.81)
RDW: 17.2 % — ABNORMAL HIGH (ref 11.5–15.5)
WBC: 12.5 K/uL — ABNORMAL HIGH (ref 4.0–10.5)
nRBC: 0.6 % — ABNORMAL HIGH (ref 0.0–0.2)

## 2024-01-26 LAB — HIV ANTIBODY (ROUTINE TESTING W REFLEX): HIV Screen 4th Generation wRfx: NONREACTIVE

## 2024-01-26 SURGERY — IMAGING PROCEDURE, GI TRACT, INTRALUMINAL, VIA CAPSULE

## 2024-01-26 MED ORDER — PREDNISONE 20 MG PO TABS
60.0000 mg | ORAL_TABLET | Freq: Every day | ORAL | Status: DC
  Administered 2024-01-27: 60 mg via ORAL
  Filled 2024-01-26: qty 3

## 2024-01-26 MED ORDER — FUROSEMIDE 40 MG PO TABS
60.0000 mg | ORAL_TABLET | Freq: Every day | ORAL | Status: DC
  Administered 2024-01-27: 60 mg via ORAL
  Filled 2024-01-26: qty 1

## 2024-01-26 MED ORDER — IRON SUCROSE 200 MG IVPB - SIMPLE MED
200.0000 mg | Freq: Once | Status: AC
  Administered 2024-01-26: 200 mg via INTRAVENOUS
  Filled 2024-01-26: qty 200

## 2024-01-26 MED ORDER — PREDNISONE 20 MG PO TABS: 60.0000 mg | ORAL_TABLET | Freq: Every day | ORAL | Status: DC

## 2024-01-26 MED ORDER — DOXYCYCLINE HYCLATE 100 MG PO TABS
100.0000 mg | ORAL_TABLET | Freq: Two times a day (BID) | ORAL | Status: AC
  Administered 2024-01-26 – 2024-01-27 (×2): 100 mg via ORAL
  Filled 2024-01-26 (×2): qty 1

## 2024-01-26 NOTE — Evaluation (Addendum)
 Occupational Therapy Evaluation Patient Details Name: Thomas Sandoval. MRN: [id number] DOB: 1955/05/31 Today's Date: 01/26/2024   History of Present Illness   Thomas Sandoval. is a 68 y.o. male presented to the ED for evaluation of cough and shortness of breath.Found to have acute on chronic respiratory failure with hypoxia, acute on chronic HFpEF, hyponatremia, symptomatic anemia. PHMx:  COPD, chronic respiratory failure with hypoxia on 3 L O2 via [place] at baseline, chronic HFpEF, persistent atrial fibrillation/flutter on Xarelto , T1DM, PAD, history of perforated gastric ulcer s/p repair 2016, chronic pain, tobacco use, chronic pain.     Clinical Impressions Pt admitted based on above, and was seen based on problem list below. PTA pt was living at home with wife, who reports he lives a primarily sedentary lifestyle. Reports at baseline he stays in bed except will walk short distance to bathroom, and receives assist for bathing and dressing. Today pt is requiring set up  to mod assist for ADLs. Pt was supervision to come EOB, but declined any further activity. Tolerated sitting EOB for 10 minutes, and requesting to remain sitting up at end of session. Noted pt reporting visual hallucinations from PM into AM, saying I see foxes outside, there were 6 last night. Notified RN and MD to continue to monitor for potential signs of delirium. Discussed goals of care with pt and wife will continue to follow acutely to prevent deconditioning, but will not complete follow up OT. Recommending BSC at d/c to assist with transfers.     If plan is discharge home, recommend the following:   A lot of help with walking and/or transfers;A lot of help with bathing/dressing/bathroom;Assistance with cooking/housework;Help with stairs or ramp for entrance;Assist for transportation     Functional Status Assessment   Patient has had a recent decline in their functional status and demonstrates the ability to  make significant improvements in function in a reasonable and predictable amount of time.     Equipment Recommendations   BSC/3in1      Precautions/Restrictions   Precautions Precautions: Fall Recall of Precautions/Restrictions: Impaired Precaution/Restrictions Comments: watch O2 Restrictions Weight Bearing Restrictions Per Provider Order: No     Mobility Bed Mobility Overal bed mobility: Needs Assistance Bed Mobility: Supine to Sit     Supine to sit: Supervision     General bed mobility comments: S for safety, HOB elevated, no physical assist    Transfers   General transfer comment: pt declined      Balance Overall balance assessment: Needs assistance Sitting-balance support: Feet supported, No upper extremity supported Sitting balance-Leahy Scale: Fair       ADL either performed or assessed with clinical judgement   ADL Overall ADL's : Needs assistance/impaired Eating/Feeding: Set up;Sitting   Grooming: Set up;Sitting   Upper Body Bathing: Sitting;Minimal assistance   Lower Body Bathing: Moderate assistance;Sitting/lateral leans   Upper Body Dressing : Minimal assistance;Sitting   Lower Body Dressing: Moderate assistance;Sitting/lateral leans                 General ADL Comments: Pt declining standing attempts, per wife requires assist at baseline     Vision Baseline Vision/History: 0 No visual deficits Vision Assessment?: No apparent visual deficits            Pertinent Vitals/Pain Pain Assessment Pain Assessment: Faces Faces Pain Scale: Hurts little more Pain Location: back Pain Descriptors / Indicators: Discomfort, Constant Pain Intervention(s): Limited activity within patient's tolerance     Extremity/Trunk Assessment Upper Extremity  Assessment Upper Extremity Assessment: Generalized weakness   Lower Extremity Assessment Lower Extremity Assessment: Defer to PT evaluation       Communication  Communication Communication: Impaired Factors Affecting Communication: Hearing impaired   Cognition Arousal: Alert Behavior During Therapy: WFL for tasks assessed/performed Cognition: Cognition impaired   Orientation impairments: Time         OT - Cognition Comments: Pt with situational awareness, but unaware of time, reporting hallucinations of foxes outside of the window       Following commands: Intact       Cueing  General Comments   Cueing Techniques: Verbal cues  O2 sats stable on 3L, via nasal cannula in pt's mouth (per pt's preference)           Home Living Family/patient expects to be discharged to:: Private residence Living Arrangements: Spouse/significant other Available Help at Discharge: Family;Available 24 hours/day Type of Home: House Home Access: Stairs to enter [employer] of Steps: 4-5 Entrance Stairs-Rails: Left;Right Home Layout: One level (1 step for bedroom)     Bathroom Shower/Tub: Chief Strategy Officer: Standard     Home Equipment: Pharmacist, Hospital (2 wheels);Transport chair          Prior Functioning/Environment Prior Level of Function : Needs assist             Mobility Comments: Mostly sedentary, only walks ~76ft to bathroom for BMs, is primarily in bed. Uses w/c for community ADLs Comments: Assist for UB/LB bathing and dressing (per wife 1x a week, not daily) mod I with toileting with increased time    OT Problem List: Decreased strength;Decreased range of motion;Impaired balance (sitting and/or standing);Cardiopulmonary status limiting activity   OT Treatment/Interventions: Self-care/ADL training;Therapeutic exercise;Energy conservation;DME and/or AE instruction;Therapeutic activities;Balance training;Patient/family education      OT Goals(Current goals can be found in the care plan section)   Acute Rehab OT Goals Patient Stated Goal: To rest OT Goal Formulation: With patient Time  For Goal Achievement: 02/09/24 Potential to Achieve Goals: Good   OT Frequency:  Min 1X/week       AM-PAC OT 6 Clicks Daily Activity     Outcome Measure Help from another person eating meals?: None Help from another person taking care of personal grooming?: A Little Help from another person toileting, which includes using toliet, bedpan, or urinal?: A Lot Help from another person bathing (including washing, rinsing, drying)?: A Lot Help from another person to put on and taking off regular upper body clothing?: A Little Help from another person to put on and taking off regular lower body clothing?: A Lot 6 Click Score: 16   End of Session Equipment Utilized During Treatment: Oxygen  Nurse Communication: Mobility status  Activity Tolerance: Patient limited by fatigue Patient left: in bed;with call bell/phone within reach;with family/visitor present  OT Visit Diagnosis: Unsteadiness on feet (R26.81);Other abnormalities of gait and mobility (R26.89);Muscle weakness (generalized) (M62.81)                Time: 9174-9146 OT Time Calculation (min): 28 min Charges:  OT General Charges $OT Visit: 1 Visit OT Evaluation $OT Eval Moderate Complexity: 1 Mod  Adrianne BROCKS, OT  Acute Rehabilitation Services Office [phone] Secure chat preferred   Adrianne GORMAN Savers 01/26/2024, 9:20 AM

## 2024-01-26 NOTE — Progress Notes (Signed)
 [facility] capsule endoscopy ordered by MD Cirigliano.  Patient ingested capsule at 0750hrs.  Per Given's capsule instructions, patient to remain NPO until 0950hrs at which time they may progress to clear liquid diet. At 1150hrs patient may have a small snack such as a half a sandwich or a bowl of soup. At 1550hrs patient may progress to previously ordered diet.  The capsule endoscopy study will conclude at 1950hrs at which time the recorder and leads or belt can be removed and placed in a patient belongings bag. Endoscopy staff will pick up the equipment in the AM.  Instructions provided to patient and inpatient RN. Patient and RN demonstrated understanding. Wife in attendance.

## 2024-01-26 NOTE — Progress Notes (Signed)
 Heart Failure Navigator Progress Note  Assessed for Heart & Vascular TOC clinic readiness.  Patient does not meet criteria due to EF 60-65%, per MD symptoms more related to COPD exacerbation. No HF TOC.   Navigator will sign off at this time.   Stephane Haddock, BSN, Scientist, Clinical (histocompatibility And Immunogenetics) Only

## 2024-01-26 NOTE — Progress Notes (Signed)
 [facility]   ROUNDING   NOTE Thomas Sandoval Dollene Mickey. FMW:[id number]  DOB: January 30, 1956  DOA: 01/23/2024  PCP: Gladis Mustard, FNP  01/26/2024,7:57 AM  LOS: 3 days    Code Status: Full code     from: Home   68 white male Persistent atrial fibrillation on Tikosyn  Interstitial lung disease COPD cigar smoker-on chronic 3 L at baseline oxygen  Previous EtOH DM TY 2 History of perforated gastric ulcer status post repair 2016 Combined systolic diastolic heart failure last EF 2023 55-60% with grade 2 diastolic dysfunction pseudonormalization Previous admission 2023 for hypoglycemia neuroglycopenia Pulmonary nodules with smoking in the past supposed to have seen pulmonary Underlying chronic hyponatremia Chronic low back pain He has a tendency to leave AGAINST MEDICAL ADVICE   Chronology  11/16 several weeks SOB with significant worsening 11/14 with chronic leg swelling fatigue some dark stools but was taking Pepto no nausea no vomiting no diarrhea-still smokes half pack per day-placed on BiPAP on arrival FOBT positive hemoglobin 5.2 sodium 117    Pertinent imaging/studies till date  CXR interstitial lung markings without active cardiopulmonary disease known posterior medial left upper lobe pulmonary nodule noted Echo this admit 11/17-EF 60-65% LV diastolic function could not be evaluated aortic valve regurg not visualized   Assessment  & Plan :    Acute respiratory failure on admission secondary to COPD exacerbation Underlying interstitial lung disease still smoker at baseline 3 L oxygen  Known pulmonary nodules Leukocytosis secondary to IV steroids which we will cut back to prednisone --finish doxycycline  100 twice daily on 11/20  AM Respiratory failure seems to be improving on Brovana 15 twice daily Pulmicort  0.25 twice daily DuoNebs every 6 as needed Acute superimposed on chronic HFpEF-BNP 730 troponins normal Variable Lasix  dosing at home changed IV to p.o. Lasix  60 for tomorrow currently -2.6  L down-weight 81-->74 kg--continue losartan  50, metoprolol  100 twice daily XL, Jardiance  10 daily FOBT positive severe anemia on admission-iron level 14 saturation ratio 4 Received Venofer 11/19 1 dose Recheck iron labs etc. a.m. EGD 2025 April recurrent risk esophagitis stricture with deformity duodenal bulb-colonoscopy was normal Prior perforated prepyloric area in 2016--- small bowel bleed suspected by GI GI input, given study appreciated--holding anticoagulation at this time ?  Endoscopy which may be challenging given his duodenal bulb anatomy-defer to GI Persistent A-flutter CHADVASC >4 on Xarelto  Beta-blocker as above-on monitors A-fib with R/T phenomenon adjust as needed a.m. magnesium  DOAC on hold until given study performed Severe hyponatremia on admission Urine osmolality 397, calculated serum osmolality 256 raising concern for SIADH more so than just hypovolemia Also prior heavy EtOH concerning for tea toast potomania next in any event with diuresis sodium is now 128 DM TY 1-Home meds lispro sliding scale, 28 units Lantus  Jardiance  as above continues 5 units 3 times daily mealtime insulin , Semglee  30 as well as sensitive sliding scale Watch sugars as he is probably not going to need much with the given study-CBGs are 120s to 180s today eating about 50% of meal Chronic pain syndrome secondary to chronic lumbago from multiple disc herniations for the past 25 years Previous heavy EtOH Continue Norco 0.5-1 every 6 as needed pain He seems to be having some hallucinations of foxes outside overnight-Will inquire about the last episode of drinking Has been confused last several days in hospital-  Called wife [name] [phone] Has been confused last several days in hospital--not usually confused--she tells me he is comifng aourndl  Data Reviewed today:  Sodium 128 potassium 4.2 chloride 86 BUN/creatinine  17/1.1   DVT prophylaxis: Xarelto  on hold-SCD for now  Status is:  Inpatient Remains inpatient appropriate because: Requires further management    Dispo/Global plan: Inpatient   Time 40   Subjective:   Apparently was confused earlier today seeing things Not really withdrawing Is undergoing given study No chest pain Cannot tell me that he is in [place] Nursing reports no other specific real concerns other than that confusion Wife had just left No dark tarry stool Has been sitting outside of breath  - When I saw him he was fast asleep and he rouses to abdomen    Objective + exam Vitals:   01/25/24 2047 01/25/24 2355 01/26/24 0500 01/26/24 0740  BP:  (!) 113/54    Pulse:  88    Resp:  18    Temp:  98.3 F (36.8 C)    TempSrc:  Axillary    SpO2: 100% 100%    Weight:   74.1 kg 74.1 kg  Height:    5' 8 (1.727 m)   Filed Weights   01/25/24 1421 01/26/24 0500 01/26/24 0740  Weight: 77.6 kg 74.1 kg 74.1 kg     Examination: EOMI NCAT chronically ill-appearing white male no distress Chest clear no wheeze JVD present Abdomen obese nontender no shifting dullness S1-S2 no murmur seems to be in A-fib on monitors?  3-1 No lower extremity edema ROM is intact   Scheduled Meds:  sodium chloride    Intravenous Once   arformoterol  15 mcg Nebulization BID   budesonide  (PULMICORT ) nebulizer solution  0.25 mg Nebulization BID   doxycycline   100 mg Oral Q12H   empagliflozin   10 mg Oral Daily   furosemide   40 mg Intravenous Daily   guaiFENesin   600 mg Oral BID   insulin  aspart  0-5 Units Subcutaneous QHS   insulin  aspart  0-9 Units Subcutaneous TID WC   insulin  aspart  5 Units Subcutaneous TID WC   insulin  glargine-yfgn  30 Units Subcutaneous Daily   ipratropium-albuterol   3 mL Nebulization TID   losartan   50 mg Oral Daily   methylPREDNISolone  (SOLU-MEDROL ) injection  80 mg Intravenous Daily   metoprolol  succinate  100 mg Oral BID   pantoprazole  (PROTONIX ) IV  40 mg Intravenous Q12H   sodium chloride  flush  3 mL Intravenous Q12H    Continuous Infusions: acetaminophen  **OR** acetaminophen , bisacodyl , HYDROcodone -acetaminophen , ipratropium-albuterol , morphine  injection, ondansetron  **OR** ondansetron  (ZOFRAN ) IV, senna-docusate  Jai-Gurmukh Ramla Hase, MD  [facility]

## 2024-01-26 NOTE — Progress Notes (Signed)
 PT Cancellation Note  Patient Details Name: Thomas Sandoval. MRN: [id number] DOB: November 06, 1955   Cancelled Treatment:    Reason Eval/Treat Not Completed: Patient declined, no reason specified. Pt was up on EOB after OT but refused to attempt to stand or move. Stated he would move when he wanted to. Wife reports he stays in bed all the time except to go to bathroom for BM.   Rodgers ORN [facility] 01/26/2024, 9:14 AM Rodgers Opal PT Acute [employer] [phone]

## 2024-01-27 ENCOUNTER — Encounter (HOSPITAL_COMMUNITY): Payer: Self-pay | Admitting: Gastroenterology

## 2024-01-27 DIAGNOSIS — J9621 Acute and chronic respiratory failure with hypoxia: Secondary | ICD-10-CM | POA: Diagnosis not present

## 2024-01-27 LAB — MAGNESIUM: Magnesium: 1.9 mg/dL (ref 1.7–2.4)

## 2024-01-27 LAB — CBC WITH DIFFERENTIAL/PLATELET
Abs Immature Granulocytes: 0.03 K/uL (ref 0.00–0.07)
Basophils Absolute: 0 K/uL (ref 0.0–0.1)
Basophils Relative: 0 %
Eosinophils Absolute: 0 K/uL (ref 0.0–0.5)
Eosinophils Relative: 0 %
HCT: 29.3 % — ABNORMAL LOW (ref 39.0–52.0)
Hemoglobin: 9.2 g/dL — ABNORMAL LOW (ref 13.0–17.0)
Immature Granulocytes: 0 %
Lymphocytes Relative: 2 %
Lymphs Abs: 0.2 K/uL — ABNORMAL LOW (ref 0.7–4.0)
MCH: 27.1 pg (ref 26.0–34.0)
MCHC: 31.4 g/dL (ref 30.0–36.0)
MCV: 86.2 fL (ref 80.0–100.0)
Monocytes Absolute: 0.6 K/uL (ref 0.1–1.0)
Monocytes Relative: 6 %
Neutro Abs: 8.7 K/uL — ABNORMAL HIGH (ref 1.7–7.7)
Neutrophils Relative %: 92 %
Platelets: 205 K/uL (ref 150–400)
RBC: 3.4 MIL/uL — ABNORMAL LOW (ref 4.22–5.81)
RDW: 17.2 % — ABNORMAL HIGH (ref 11.5–15.5)
WBC: 9.5 K/uL (ref 4.0–10.5)
nRBC: 1.3 % — ABNORMAL HIGH (ref 0.0–0.2)

## 2024-01-27 LAB — GLUCOSE, CAPILLARY
Glucose-Capillary: 60 mg/dL — ABNORMAL LOW (ref 70–99)
Glucose-Capillary: 115 mg/dL — ABNORMAL HIGH (ref 70–99)
Glucose-Capillary: 96 mg/dL (ref 70–99)
Glucose-Capillary: 224 mg/dL — ABNORMAL HIGH (ref 70–99)

## 2024-01-27 LAB — BASIC METABOLIC PANEL WITH GFR
Anion gap: 12 (ref 5–15)
BUN: 18 mg/dL (ref 8–23)
CO2: 32 mmol/L (ref 22–32)
Calcium: 9 mg/dL (ref 8.9–10.3)
Chloride: 88 mmol/L — ABNORMAL LOW (ref 98–111)
Creatinine, Ser: 1.12 mg/dL (ref 0.61–1.24)
GFR, Estimated: >60 mL/min (ref >60)
Glucose, Bld: 117 mg/dL — ABNORMAL HIGH (ref 70–99)
Potassium: 3.4 mmol/L — ABNORMAL LOW (ref 3.5–5.1)
Sodium: 132 mmol/L — ABNORMAL LOW (ref 135–145)

## 2024-01-27 MED ORDER — INSULIN GLARGINE-YFGN 100 UNIT/ML ~~LOC~~ SOLN: 30.0000 [IU] | Freq: Every day | SUBCUTANEOUS | Status: AC

## 2024-01-27 MED ORDER — PANTOPRAZOLE SODIUM 40 MG PO TBEC: 40.0000 mg | DELAYED_RELEASE_TABLET | Freq: Two times a day (BID) | ORAL | 0 refills | Status: AC

## 2024-01-27 MED ORDER — FLUTICASONE-SALMETEROL 115-21 MCG/ACT IN AERO: 2.0000 | INHALATION_SPRAY | Freq: Two times a day (BID) | RESPIRATORY_TRACT | 12 refills | Status: DC

## 2024-01-27 MED ORDER — PREDNISONE 20 MG PO TABS
60.0000 mg | ORAL_TABLET | Freq: Every day | ORAL | 0 refills | Status: AC
Start: 2024-01-28 — End: ?

## 2024-01-27 MED ORDER — GUAIFENESIN ER 600 MG PO TB12: 600.0000 mg | ORAL_TABLET | Freq: Two times a day (BID) | ORAL | 0 refills | Status: AC

## 2024-01-27 NOTE — Plan of Care (Signed)
   Problem: Education: Goal: Ability to describe self-care measures that may prevent or decrease complications (Diabetes Survival Skills Education) will improve Outcome: Adequate for Discharge Goal: Individualized Educational Video(s) Outcome: Adequate for Discharge   Problem: Coping: Goal: Ability to adjust to condition or change in health will improve Outcome: Adequate for Discharge   Problem: Fluid Volume: Goal: Ability to maintain a balanced intake and output will improve Outcome: Adequate for Discharge   Problem: Health Behavior/Discharge Planning: Goal: Ability to identify and utilize available resources and services will improve Outcome: Adequate for Discharge Goal: Ability to manage health-related needs will improve Outcome: Adequate for Discharge   Problem: Metabolic: Goal: Ability to maintain appropriate glucose levels will improve Outcome: Adequate for Discharge   Problem: Nutritional: Goal: Maintenance of adequate nutrition will improve Outcome: Adequate for Discharge Goal: Progress toward achieving an optimal weight will improve Outcome: Adequate for Discharge   Problem: Skin Integrity: Goal: Risk for impaired skin integrity will decrease Outcome: Adequate for Discharge   Problem: Tissue Perfusion: Goal: Adequacy of tissue perfusion will improve Outcome: Adequate for Discharge   Problem: Education: Goal: Knowledge of General Education information will improve Description: Including pain rating scale, medication(s)/side effects and non-pharmacologic comfort measures Outcome: Adequate for Discharge   Problem: Health Behavior/Discharge Planning: Goal: Ability to manage health-related needs will improve Outcome: Adequate for Discharge   Problem: Clinical Measurements: Goal: Ability to maintain clinical measurements within normal limits will improve Outcome: Adequate for Discharge Goal: Will remain free from infection Outcome: Adequate for Discharge Goal:  Diagnostic test results will improve Outcome: Adequate for Discharge Goal: Respiratory complications will improve Outcome: Adequate for Discharge Goal: Cardiovascular complication will be avoided Outcome: Adequate for Discharge   Problem: Activity: Goal: Risk for activity intolerance will decrease Outcome: Adequate for Discharge   Problem: Nutrition: Goal: Adequate nutrition will be maintained Outcome: Adequate for Discharge   Problem: Coping: Goal: Level of anxiety will decrease Outcome: Adequate for Discharge   Problem: Elimination: Goal: Will not experience complications related to bowel motility Outcome: Adequate for Discharge Goal: Will not experience complications related to urinary retention Outcome: Adequate for Discharge   Problem: Pain Managment: Goal: General experience of comfort will improve and/or be controlled Outcome: Adequate for Discharge   Problem: Safety: Goal: Ability to remain free from injury will improve Outcome: Adequate for Discharge   Problem: Skin Integrity: Goal: Risk for impaired skin integrity will decrease Outcome: Adequate for Discharge   Problem: Education: Goal: Knowledge of disease or condition will improve Outcome: Adequate for Discharge Goal: Knowledge of the prescribed therapeutic regimen will improve Outcome: Adequate for Discharge Goal: Individualized Educational Video(s) Outcome: Adequate for Discharge   Problem: Activity: Goal: Ability to tolerate increased activity will improve Outcome: Adequate for Discharge Goal: Will verbalize the importance of balancing activity with adequate rest periods Outcome: Adequate for Discharge   Problem: Respiratory: Goal: Ability to maintain a clear airway will improve Outcome: Adequate for Discharge Goal: Levels of oxygenation will improve Outcome: Adequate for Discharge Goal: Ability to maintain adequate ventilation will improve Outcome: Adequate for Discharge

## 2024-01-27 NOTE — Discharge Summary (Signed)
 Physician Discharge Summary  [name] FMW:[id number] DOB: [birth year] DOA: 01/23/2024  PCP: Gladis Mustard, FNP  Admit date: 01/23/2024 Discharge date: 01/27/2024  Time spent: 45 minutes  Recommendations for Outpatient Follow-up:  Requires CBC Chem-12 in 1 week and may require referral to GI if he remains anemic in the next 2 to 3 months on routine checks He needs to take his diuretics regularly and adjust his fluid/salt intake-please provide outpatient diet education Please refer to pulmonology to have pulmonary nodules followed  Discharge Diagnoses:  MAIN problem for hospitalization   Multifactorial respiratory failure acute on admission Probable slow GI bleed without clear cause potentially nutritional  Please see below for itemized issues addressed in HOpsital- refer to other progress notes for clarity if needed  Discharge Condition: Improved  Diet recommendation: Diabetic heart healthy  Filed Weights   01/26/24 0500 01/26/24 0740 01/27/24 0321  Weight: 74.1 kg 74.1 kg 74.4 kg    History of present illness:  14 white male Persistent atrial fibrillation on Tikosyn  previously Interstitial lung disease COPD cigar smoker-on chronic 3 L at baseline oxygen  Previous EtOH-used heavily when he was not able to get his pain meds DM TY 2 History of perforated gastric ulcer status post repair 2016 Combined systolic diastolic heart failure last EF 2023 55-60% with grade 2 diastolic dysfunction pseudonormalization Previous admission 2023 for hypoglycemia neuroglycopenia Pulmonary nodules with smoking in the past supposed to have seen pulmonary Underlying chronic hyponatremia Chronic low back pain He has a tendency to leave AGAINST MEDICAL ADVICE     Chronology  11/16 several weeks SOB with significant worsening 11/14 with chronic leg swelling fatigue some dark stools but was taking Pepto no nausea no vomiting no diarrhea-still smokes half pack per day-placed on  BiPAP on arrival FOBT positive hemoglobin 5.2 sodium 117      Pertinent imaging/studies till date  CXR interstitial lung markings without active cardiopulmonary disease known posterior medial left upper lobe pulmonary nodule noted Echo this admit 11/17-EF 60-65% LV diastolic function could not be evaluated aortic valve regurg not visualized    Assessment  & Plan :      Acute respiratory failure on admission secondary to COPD exacerbation Underlying interstitial lung disease still smoker at baseline 3 L oxygen  Known pulmonary nodules Leukocytosis secondary to IV steroids which we will cut back to prednisone  to finish several more days in the outpatient setting he completed doxycycline  on the inpatient setting Respiratory failure seems to be improving on Brovana 15 twice daily Pulmicort  0.25 twice daily-I have added Advair  additionally he can continue his levalbuterol  DuoNebs every 6 as needed Acute superimposed on chronic HFpEF-BNP 730 troponins normal Variable Lasix  dosing at home changed back to home meds with variable Lasix  20-40 dosing after getting IV diuresis--3 L down weight down from 81 to 74 kg At this charge did resume losartan  50, metoprolol  100 twice daily XL, Jardiance  10 daily FOBT positive severe anemia on admission-iron level 14 saturation ratio 4 Received Venofer 11/19 1 dose Will need outpatient iron labs in 2 to 3 weeks EGD 2025 April recurrent risk esophagitis stricture with deformity duodenal bulb-colonoscopy was normal Prior perforated prepyloric area in 2016--- small bowel bleed suspected by GI GI input, given study appreciated--spoke with Dr. San he feels that patient had no bleeding whatsoever Patient can be resumed from their perspective on Xarelto  as no source of bleeding despite esophagitis in the past this year and colonoscopy negative earlier this year--- I have resumed Xarelto  and he knows  warning signs to come back Persistent A-flutter CHADVASC >4 on  Xarelto  Beta-blocker as above-on monitors A-fib with R/T phenomenon-stable on day of discharge Severe hyponatremia on admission Urine osmolality 397, calculated serum osmolality 256 raising concern for SIADH more so than just hypovolemia This resolved relatively well he has been instructed to get information about salt and water  DM TY 1-Home meds lispro sliding scale, 28 units Lantus  Became hypoglycemic during hospital stay Jardiance  resumed long-acting insulin  resumed and sliding scale resumed Chronic pain syndrome secondary to chronic lumbago from multiple disc herniations for the past 25 years Previous heavy EtOH Continue Norco 0.5-1 every 6 as needed pain-no prescriptions given He seems a bit more with it at day of discharge and less confused according to his wife who is at the bedside   Discharge Exam: Vitals:   01/27/24 1040 01/27/24 1042  BP: 105/64 105/64  Pulse: 67 81  Resp:  16  Temp:  98.2 F (36.8 C)  SpO2:  100%    Subj on day of d/c   Awake pleasant coherent no distress--he seems more with  General Exam on discharge  EOMI NCAT no focal deficit CTAB no added sound Abdomen slightly tender upper quadrant but no rebound or guarding No lower extremity edema ROM intact Neuro intact  Discharge Instructions   Discharge Instructions     Diet - low sodium heart healthy   Complete by: As directed    Discharge instructions   Complete by: As directed    We have added some steroids to your meds for another 3 days You will need to take Protonix  twice a day to protect you from bleeding as you need to be on a blood thinner for your atrial fibrillation Report any dark tarry stool or bright red blood per rectum immediately to the emergency room If you cough up blood come to the emergency room  You were diagnosed with a little bit of COPD heart failure but the main thing was your hemoglobin was low and that may be because of your previous esophagitis  It would be a good  idea to take Lasix  as your usual home dose notice we have cut back your long-acting insulin  little bit and resumed your Jardiance  10 please use your oxygen  at all times Please mobilize   Increase activity slowly   Complete by: As directed       Allergies as of 01/27/2024       Reactions   Avelox  [moxifloxacin  Hcl In Nacl] Other (See Comments)   Hemolysis  In 2012   Azithromycin  Other (See Comments), Nausea And Vomiting   Severe stomach cramps; told to list as an allergy by dr. Orlinda ago   Bactrim [sulfamethoxazole -trimethoprim] Diarrhea, Nausea And Vomiting        Medication List     STOP taking these medications    cefUROXime  250 MG tablet Commonly known as: CEFTIN    doxycycline  100 MG tablet Commonly known as: VIBRA -TABS   insulin  glargine 100 UNIT/ML injection Commonly known as: Lantus    omeprazole  40 MG capsule Commonly known as: PRILOSEC   Trelegy Ellipta  100-62.5-25 MCG/ACT Aepb Generic drug: Fluticasone -Umeclidin-Vilant       TAKE these medications    empagliflozin  10 MG Tabs tablet Commonly known as: Jardiance  Take 1 tablet (10 mg total) by mouth daily.   fluticasone -salmeterol 115-21 MCG/ACT inhaler Commonly known as: Advair  HFA Inhale 2 puffs into the lungs 2 (two) times daily.   furosemide  40 MG tablet Commonly known as: LASIX  Take 0.5-1  tablets (20-40 mg total) by mouth See admin instructions. 20 mg Sun Tues Thurs Sat, 40 mg Mon Wed Fri   guaiFENesin  600 MG 12 hr tablet Commonly known as: MUCINEX  Take 1 tablet (600 mg total) by mouth 2 (two) times daily.   HYDROcodone -acetaminophen  10-325 MG tablet Commonly known as: NORCO Take 0.5-1 tablets by mouth every 6 (six) hours as needed for severe pain (pain score 7-10) or moderate pain (pain score 4-6). What changed: Another medication with the same name was removed. Continue taking this medication, and follow the directions you see here.   insulin  glargine-yfgn 100 UNIT/ML injection Commonly  known as: SEMGLEE  Inject 0.3 mLs (30 Units total) into the skin daily. Start taking on: January 28, 2024   insulin  lispro 100 UNIT/ML injection Commonly known as: HumaLOG  PER SLIDING SCALE: 190 - 200 = 2 UNITS. 200-300= 4u 300 AND ABOVE = 7 UNITS.   ipratropium-albuterol  0.5-2.5 (3) MG/3ML Soln Commonly known as: DUONEB INHALE CONTENTS OF 1 VIAL VIA NEBULIZER EVERY 4 HOURS AS NEEDED   levalbuterol  0.31 MG/3ML nebulizer solution Commonly known as: XOPENEX  INHALE 3 MLS (0.31 MG TOTAL) BY NEBULIZATION EVERY 4 (FOUR) HOURS AS NEEDED FOR WHEEZING.   losartan  50 MG tablet Commonly known as: COZAAR  Take 1 tablet (50 mg total) by mouth daily.   metoprolol  succinate 100 MG 24 hr tablet Commonly known as: TOPROL -XL Take 1 tablet (100 mg total) by mouth 2 (two) times daily. Take with or immediately following a meal.   OVER THE COUNTER MEDICATION Take 1-2 tablets by mouth See admin instructions. Super Beets gummies- Chew 1-2 gummies by mouth every day   OXYGEN  Inhale 3 L/min into the lungs continuous.   pantoprazole  40 MG tablet Commonly known as: Protonix  Take 1 tablet (40 mg total) by mouth 2 (two) times daily.   predniSONE  20 MG tablet Commonly known as: DELTASONE  Take 3 tablets (60 mg total) by mouth daily before breakfast. Start taking on: January 28, 2024   Xarelto  20 MG Tabs tablet Generic drug: rivaroxaban  TAKE 1 TABLET BY MOUTH DAILY  WITH SUPPER What changed: when to take this       Allergies  Allergen Reactions   Avelox  [Moxifloxacin  Hcl In Nacl] Other (See Comments)    Hemolysis  In 2012   Azithromycin  Other (See Comments) and Nausea And Vomiting    Severe stomach cramps; told to list as an allergy by dr. Orlinda ago   Bactrim [Sulfamethoxazole -Trimethoprim] Diarrhea and Nausea And Vomiting      The results of significant diagnostics from this hospitalization (including imaging, microbiology, ancillary and laboratory) are listed below for reference.     Significant Diagnostic Studies: ECHOCARDIOGRAM COMPLETE Result Date: 01/24/2024    ECHOCARDIOGRAM REPORT   Patient Name:   Thomas Sandoval. Date of Exam: 01/24/2024 Medical Rec #:  [id number]             Height:       70.0 in Accession #:    [id number]            Weight:       180.0 lb Date of Birth:  [birth year]             BSA:          1.996 m Patient Age:    68 years              BP:           133/61 mmHg Patient Gender: M  HR:           88 bpm. Exam Location:  Inpatient Procedure: 2D Echo, Cardiac Doppler and Color Doppler (Both Spectral and Color            Flow Doppler were utilized during procedure). Indications:    I50.9* Heart failure (unspecified)  History:        Patient has prior history of Echocardiogram examinations, most                 recent 05/21/2021. COPD, Arrythmias:Atrial Fibrillation; Risk                 Factors:Diabetes, Hypertension and ETOH.  Sonographer:    Sherlean Dubin Sonographer#2:  Damien Senior RDCS Referring Phys: JORIE JONELLE BLANCH  Sonographer Comments: Tecnically difficult study due to COPD, patient is supine on bipap IMPRESSIONS  1. Left ventricular ejection fraction, by estimation, is 60 to 65%. The left ventricle has normal function. The left ventricle has no regional wall motion abnormalities. Left ventricular diastolic function could not be evaluated.  2. Right ventricular systolic function is normal. The right ventricular size is normal.  3. The mitral valve is normal in structure. No evidence of mitral valve regurgitation. No evidence of mitral stenosis.  4. The aortic valve is normal in structure. Aortic valve regurgitation is not visualized. No aortic stenosis is present.  5. The inferior vena cava is normal in size with greater than 50% respiratory variability, suggesting right atrial pressure of 3 mmHg. FINDINGS  Left Ventricle: Left ventricular ejection fraction, by estimation, is 60 to 65%. The left ventricle has normal function. The left  ventricle has no regional wall motion abnormalities. The left ventricular internal cavity size was normal in size. There is  no left ventricular hypertrophy. Left ventricular diastolic function could not be evaluated due to atrial fibrillation. Left ventricular diastolic function could not be evaluated. Right Ventricle: The right ventricular size is normal. No increase in right ventricular wall thickness. Right ventricular systolic function is normal. Left Atrium: Left atrial size was normal in size. Right Atrium: Right atrial size was normal in size. Pericardium: There is no evidence of pericardial effusion. Mitral Valve: The mitral valve is normal in structure. No evidence of mitral valve regurgitation. No evidence of mitral valve stenosis. MV peak gradient, 8.3 mmHg. The mean mitral valve gradient is 2.0 mmHg. Tricuspid Valve: The tricuspid valve is normal in structure. Tricuspid valve regurgitation is not demonstrated. No evidence of tricuspid stenosis. Aortic Valve: The aortic valve is normal in structure. Aortic valve regurgitation is not visualized. No aortic stenosis is present. Pulmonic Valve: The pulmonic valve was normal in structure. Pulmonic valve regurgitation is not visualized. No evidence of pulmonic stenosis. Aorta: The aortic root is normal in size and structure. Venous: The inferior vena cava is normal in size with greater than 50% respiratory variability, suggesting right atrial pressure of 3 mmHg. IAS/Shunts: No atrial level shunt detected by color flow Doppler.  LEFT VENTRICLE PLAX 2D LVIDd:         3.10 cm LVIDs:         1.90 cm LV PW:         1.30 cm LV IVS:        1.40 cm LVOT diam:     2.20 cm LV SV:         68 LV SV Index:   34 LVOT Area:     3.80 cm  RIGHT VENTRICLE RV S prime:  10.70 cm/s TAPSE (M-mode): 1.9 cm LEFT ATRIUM             Index LA diam:        3.90 cm 1.95 cm/m LA Vol (A2C):   51.3 ml 25.70 ml/m LA Vol (A4C):   38.5 ml 19.29 ml/m LA Biplane Vol: 48.5 ml 24.30 ml/m   AORTIC VALVE LVOT Vmax:   84.60 cm/s LVOT Vmean:  57.600 cm/s LVOT VTI:    0.180 m  AORTA Ao Root diam: 3.20 cm MITRAL VALVE MV Area VTI:  2.77 cm   SHUNTS MV Peak grad: 8.3 mmHg   Systemic VTI:  0.18 m MV Mean grad: 2.0 mmHg   Systemic Diam: 2.20 cm MV Vmax:      1.44 m/s MV Vmean:     66.9 cm/s Morene Brownie Electronically signed by Morene Brownie Signature Date/Time: 01/24/2024/1:16:47 PM    Final    DG Chest Portable 1 View Result Date: 01/23/2024 CLINICAL DATA:  Shortness of breath. EXAM: PORTABLE CHEST 1 VIEW COMPARISON:  December 03, 2022 FINDINGS: The heart size and mediastinal contours are within normal limits. Mild, stable, diffuse, chronic appearing increased interstitial lung markings are seen. A stable 8 mm ill-defined left upper lobe nodular opacity is seen, adjacent to the aortic arch. No pleural effusion or pneumothorax is identified. The visualized skeletal structures are unremarkable. IMPRESSION: 1. Stable chronic appearing increased interstitial lung markings without evidence of acute or active cardiopulmonary disease. 2. Findings consistent with the patient's known posteromedial left upper lobe pulmonary nodule. Electronically Signed   By: Suzen Dials M.D.   On: 01/23/2024 19:08    Microbiology: Recent Results (from the past 240 hours)  Blood culture (routine x 2)     Status: None (Preliminary result)   Collection Time: 01/23/24  5:02 PM   Specimen: BLOOD  Result Value Ref Range Status   Specimen Description BLOOD BLOOD RIGHT FOREARM  Final   Special Requests   Final    BOTTLES DRAWN AEROBIC AND ANAEROBIC Blood Culture adequate volume   Culture   Final    NO GROWTH 4 DAYS Performed at [facility], [address], [place], [place] [address]    Report Status PENDING  Incomplete  Blood culture (routine x 2)     Status: None (Preliminary result)   Collection Time: 01/23/24  5:07 PM   Specimen: BLOOD  Result Value Ref Range Status   Specimen Description BLOOD  RIGHT ANTECUBITAL  Final   Special Requests   Final    BOTTLES DRAWN AEROBIC AND ANAEROBIC Blood Culture adequate volume   Culture   Final    NO GROWTH 4 DAYS Performed at [facility], [address] [place], [place] [address]    Report Status PENDING  Incomplete  Resp panel by RT-PCR (RSV, Flu A&B, Covid) Peripheral     Status: None   Collection Time: 01/23/24  5:11 PM   Specimen: Peripheral; Nasal Swab  Result Value Ref Range Status   SARS Coronavirus 2 by RT PCR NEGATIVE NEGATIVE Final   Influenza A by PCR NEGATIVE NEGATIVE Final   Influenza B by PCR NEGATIVE NEGATIVE Final    Comment: (NOTE) The Xpert Xpress SARS-CoV-2/FLU/RSV plus assay is intended as an aid in the diagnosis of influenza from Nasopharyngeal swab specimens and should not be used as a sole basis for treatment. Nasal washings and aspirates are unacceptable for Xpert Xpress SARS-CoV-2/FLU/RSV testing.  Fact Sheet for Patients: bloggercourse.com  Fact Sheet for  Healthcare Providers: seriousbroker.it  This test is not yet approved or cleared by the [place]  FDA and has been authorized for detection and/or diagnosis of SARS-CoV-2 by FDA under an Emergency Use Authorization (EUA). This EUA will remain in effect (meaning this test can be used) for the duration of the COVID-19 declaration under Section 564(b)(1) of the Act, 21 U.S.C. section 360bbb-3(b)(1), unless the authorization is terminated or revoked.     Resp Syncytial Virus by PCR NEGATIVE NEGATIVE Final    Comment: (NOTE) Fact Sheet for Patients: bloggercourse.com  Fact Sheet for Healthcare Providers: seriousbroker.it  This test is not yet approved or cleared by the [place]  FDA and has been authorized for detection and/or diagnosis of SARS-CoV-2 by FDA under an Emergency Use Authorization (EUA). This EUA will remain in effect  (meaning this test can be used) for the duration of the COVID-19 declaration under Section 564(b)(1) of the Act, 21 U.S.C. section 360bbb-3(b)(1), unless the authorization is terminated or revoked.  Performed at [facility], [address], [place], [place] [address]      Labs: Basic Metabolic Panel: Recent Labs  Lab 01/24/24 0700 01/24/24 1227 01/25/24 0806 01/25/24 1802 01/27/24 0240  NA 125* 129* 128* 128* 132*  K 4.1 3.5 3.7 4.2 3.4*  CL 83* 83* 82* 86* 88*  CO2 26 28 30 28  32  GLUCOSE 275* 257* 265* 163* 117*  BUN 14 14 18 17 18   CREATININE 1.28* 1.36* 1.35* 1.10 1.12  CALCIUM 9.2 9.4 9.0 9.2 9.0  MG 1.8  --   --   --  1.9   Liver Function Tests: Recent Labs  Lab 01/23/24 1711  AST 26  ALT 12  ALKPHOS 73  BILITOT 1.0  PROT 6.6  ALBUMIN 3.0*   No results for input(s): LIPASE, AMYLASE in the last 168 hours. No results for input(s): AMMONIA in the last 168 hours. CBC: Recent Labs  Lab 01/23/24 1711 01/23/24 1838 01/24/24 0700 01/25/24 0239 01/25/24 1802 01/26/24 0247 01/27/24 0819  WBC 6.2  --  4.4 12.0*  --  12.5* 9.5  NEUTROABS 5.1  --   --  11.0*  --  11.6* 8.7*  HGB 5.2*   < > 8.5* 7.8* 9.0* 8.2* 9.2*  HCT 17.4*   < > 25.9* 23.8* 28.1* 25.9* 29.3*  MCV 82.5  --  82.2 83.2  --  85.2 86.2  PLT 228  --  220 233  --  219 205   < > = values in this interval not displayed.   Cardiac Enzymes: No results for input(s): CKTOTAL, CKMB, CKMBINDEX, TROPONINI in the last 168 hours. BNP: BNP (last 3 results) Recent Labs    01/23/24 1711  BNP 730.4*    ProBNP (last 3 results) No results for input(s): PROBNP in the last 8760 hours.  CBG: Recent Labs  Lab 01/26/24 2114 01/27/24 0608 01/27/24 0642 01/27/24 1124 01/27/24 1318  GLUCAP 228* 60* 115* 96 224*    Signed:  Colen Grimes MD   [facility] 01/27/2024, 3:15 PM

## 2024-01-27 NOTE — Inpatient Diabetes Management (Signed)
 Inpatient Diabetes Program Recommendations  AACE/ADA: New Consensus Statement on Inpatient Glycemic Control (2015)  Target Ranges:  Prepandial:   less than 140 mg/dL      Peak postprandial:   less than 180 mg/dL (1-2 hours)      Critically ill patients:  140 - 180 mg/dL   Lab Results  Component Value Date   GLUCAP 115 (H) 01/27/2024   HGBA1C 5.6 01/24/2024    Review of Glycemic Control  Latest Reference Range & Units 01/26/24 11:07 01/26/24 16:10 01/26/24 21:14 01/27/24 06:08 01/27/24 06:42  Glucose-Capillary 70 - 99 mg/dL 872 (H) 768 (H) 771 (H) 60 (L) 115 (H)  Diabetes history: Type 2DM Outpatient Diabetes medications: Lantus  28 units every day, Humalog  SSI TID, Jardiance  10 mg QD Current orders for Inpatient glycemic control: Semglee  30 units every day, Novolog  0-9 units TID & HS Prednisone  60 mg daily, Novolog  5 units tid with meals  Inpatient Diabetes Program Recommendations:    Of note, patient did not receive Novolog  meal coverage yesterday due to reduced meal intake.   Fasting CBG low this morning (60 mg/dL).  Consider reducing Semglee  to 26 units q HS.  May also need reduction in Novolog  meal coverage.    Thanks , Randall Bullocks, RN, BC-ADM Inpatient Diabetes Coordinator Pager [phone]  (8a-5p)

## 2024-01-27 NOTE — Care Management Important Message (Signed)
 Important Message  Patient Details  Name: Thomas Sandoval. MRN: [id number] Date of Birth: 05-31-1955   Important Message Given:  Yes - Medicare IM     Vonzell Arrie Sharps 01/27/2024, 10:26 AM

## 2024-01-27 NOTE — Procedures (Addendum)
 Video Capsule Endoscopy completed and images reviewed.   Indication: Iron  deficiency anemia  Findings: 1) Complete capsule study with adequate views 2) First duodenal image at 2 hr 2 min. Capsule then refluxed between the prepyloric antrum and the duodenal bulb for  2 hours before traversing the area of acute angulation from the prior EGD 3) Remainder of the small bowel was normal appearing. No active bleeding, heme, or stigmata of recent bleeding noted.  4) After exiting the duodenal bulb, small bowel transit time was 3 hr 2 min 5) First cecal image at 7 h 7 min  Summary and Recommendations: Complete capsule study with adequate views. Normal appearing small bowel mucosa throughout. No areas of active bleeding or stigmata of recent bleeding noted on this study.   - Advance diet as tolerated - Repeat CBC in 7-10 days after hospital discharge - Repeat iron  panel in 2-3 months - Can follow-up in the GI clinic as outpatient as needed  Sandor Flatter, DO, [facility]

## 2024-01-27 NOTE — Evaluation (Signed)
 Physical Therapy Evaluation and D/C Patient Details Name: Thomas Sandoval. MRN: [id number] DOB: Jul 30, 1955 Today's Date: 01/27/2024  History of Present Illness  Thomas Oleson. is a 68 y.o. male presented to the ED for evaluation of cough and shortness of breath.Found to have acute on chronic respiratory failure with hypoxia, acute on chronic HFpEF, hyponatremia, symptomatic anemia. PHMx:  COPD, chronic respiratory failure with hypoxia on 3 L O2 via [place] at baseline, chronic HFpEF, persistent atrial fibrillation/flutter on Xarelto , T1DM, PAD, history of perforated gastric ulcer s/p repair 2016, chronic pain, tobacco use, chronic pain.  Clinical Impression  Pt admitted with above diagnosis. Pt appears to be at his baseline. Wife present and feels comfortable taking care of pt at home. Pt did some activity at EOB but refused to stand or walk and demonstrates that he has the strength to move his LEs. Pt with limited  mobility at home per wife and pt due to breathing difficulties and back issues. Wife states he ambulates to bathroom and back mostly at home.  Pt and wife state they dont want to get PT in hospital or at home. Will sign off.     If plan is discharge home, recommend the following: A little help with walking and/or transfers;A little help with bathing/dressing/bathroom;Assistance with cooking/housework;Assist for transportation;Help with stairs or ramp for entrance;Direct supervision/assist for medications management;Direct supervision/assist for financial management   Can travel by private vehicle        Equipment Recommendations None recommended by PT  Recommendations for Other Services       Functional Status Assessment Patient has not had a recent decline in their functional status     Precautions / Restrictions Precautions Precautions: Fall Recall of Precautions/Restrictions: Impaired Precaution/Restrictions Comments: watch O2 Restrictions Weight Bearing  Restrictions Per Provider Order: No      Mobility  Bed Mobility Overal bed mobility: Needs Assistance Bed Mobility: Supine to Sit     Supine to sit: Min assist     General bed mobility comments: S for safety, HOB elevated, wife helped pt to EOB prior to PT arrival and she said she let him pull up on her.    Transfers                   General transfer comment: pt declined    Ambulation/Gait                  Stairs            Wheelchair Mobility     Tilt Bed    Modified Rankin (Stroke Patients Only)       Balance Overall balance assessment: Needs assistance Sitting-balance support: Feet supported, No upper extremity supported Sitting balance-Leahy Scale: Fair Sitting balance - Comments: sitting EOB eating snack on arrival                                     Pertinent Vitals/Pain Pain Assessment Pain Assessment: Faces Faces Pain Scale: Hurts even more Pain Location: back Pain Descriptors / Indicators: Discomfort, Constant Pain Intervention(s): Limited activity within patient's tolerance, Monitored during session, Repositioned    Home Living Family/patient expects to be discharged to:: Private residence Living Arrangements: Spouse/significant other Available Help at Discharge: Family;Available 24 hours/day Type of Home: House Home Access: Stairs to enter Entrance Stairs-Rails: Lawyer of Steps: 4-5   Home Layout: One level (1 step  for bedroom) Home Equipment: Shower Counsellor (2 wheels);Transport chair Additional Comments: on 3L O2 at baseline    Prior Function Prior Level of Function : Needs assist             Mobility Comments: Mostly sedentary, only walks ~36ft to bathroom for BMs, is primarily in bed. Uses w/c for community ADLs Comments: Assist for UB/LB bathing and dressing (per wife 1x a week, not daily) mod I with toileting with increased time     Extremity/Trunk  Assessment   Upper Extremity Assessment Upper Extremity Assessment: Defer to OT evaluation    Lower Extremity Assessment Lower Extremity Assessment: Generalized weakness    Cervical / Trunk Assessment Cervical / Trunk Assessment: Normal  Communication   Communication Communication: Impaired Factors Affecting Communication: Hearing impaired    Cognition Arousal: Alert Behavior During Therapy: WFL for tasks assessed/performed                             Following commands: Intact       Cueing Cueing Techniques: Verbal cues     General Comments General comments (skin integrity, edema, etc.): O2 sats stable on 3L, via nasal cannula in pt's mouth (per pt's preference)81 bpm, 99%    Exercises General Exercises - Lower Extremity Long Arc Quad: AROM, Both, 5 reps, Seated Hip Flexion/Marching: AROM, Both, 5 reps, Seated   Assessment/Plan    PT Assessment Patient does not need any further PT services  PT Problem List         PT Treatment Interventions      PT Goals (Current goals can be found in the Care Plan section)  Acute Rehab PT Goals Patient Stated Goal: to go home PT Goal Formulation: All assessment and education complete, DC therapy    Frequency       Co-evaluation               AM-PAC PT 6 Clicks Mobility  Outcome Measure Help needed turning from your back to your side while in a flat bed without using bedrails?: A Little Help needed moving from lying on your back to sitting on the side of a flat bed without using bedrails?: A Little Help needed moving to and from a bed to a chair (including a wheelchair)?: A Little Help needed standing up from a chair using your arms (e.g., wheelchair or bedside chair)?: A Little Help needed to walk in hospital room?: A Little Help needed climbing 3-5 steps with a railing? : Total 6 Click Score: 16    End of Session Equipment Utilized During Treatment: Oxygen  Activity Tolerance: Patient limited by  fatigue (self limiting) Patient left: in bed;with call bell/phone within reach;with family/visitor present (sitting EOB) Nurse Communication: Mobility status PT Visit Diagnosis: Muscle weakness (generalized) (M62.81)    Time: 8857-8845 PT Time Calculation (min) (ACUTE ONLY): 12 min   Charges:   PT Evaluation $PT Eval Low Complexity: 1 Low   PT General Charges $$ ACUTE PT VISIT: 1 Visit         Mikaia Janvier M,PT Acute Rehab Services [phone]   Stephane JULIANNA Bevel 01/27/2024, 2:06 PM

## 2024-01-27 NOTE — Progress Notes (Signed)
 Patient with cbg of 96 but feeling symptomatic. Given 4 oz soda and graham crackers with peanut butter upon request.

## 2024-01-27 NOTE — TOC CM/SW Note (Signed)
 Transition of Care [facility]) - Inpatient Brief Assessment   Patient Details  Name: Thomas Sandoval. MRN: [id number] Date of Birth: 1955/10/25  [facility]) CM/SW Contact:    Waddell Barnie Rama, RN Phone Number: 01/27/2024, 11:59 AM   Clinical Narrative: From home with spouse, has PCP and insurance on file, states has no HH services in place at this time or has w/chair and walker at home.  States family member  (wife) will transport them home at [employer] and family is support system, states gets medications from [facility] in [place].  Pta self ambulatory.   There are no ICM needs identified  at this time.  Please place consult for ICM needs.     Transition of Care Asessment: Insurance and Status: Insurance coverage has been reviewed Patient has primary care physician: Yes Home environment has been reviewed: home with wife Prior level of function:: ambulatory Prior/Current Home Services: Current home services (walker, w/chair) Social Drivers of Health Review: SDOH reviewed no interventions necessary   Transition of care needs: no transition of care needs at this time

## 2024-01-27 NOTE — Plan of Care (Signed)
   Problem: Education: Goal: Ability to describe self-care measures that may prevent or decrease complications (Diabetes Survival Skills Education) will improve Outcome: Progressing   Problem: Coping: Goal: Ability to adjust to condition or change in health will improve Outcome: Progressing   Problem: Fluid Volume: Goal: Ability to maintain a balanced intake and output will improve Outcome: Progressing

## 2024-01-27 NOTE — TOC Transition Note (Signed)
 Transition of Care [facility]) - Discharge Note   Patient Details  Name: Thomas Sandoval. MRN: [id number] Date of Birth: 15-Oct-1955  Transition of Care [facility]) CM/SW Contact:  Waddell Barnie Rama, RN Phone Number: 01/27/2024, 12:00 PM   Clinical Narrative:    For possible dc today .  Wife at bedside to transport.         Patient Goals and CMS Choice            Discharge Placement                       Discharge Plan and Services Additional resources added to the After Visit Summary for                                       Social Drivers of Health (SDOH) Interventions SDOH Screenings   Food Insecurity: No Food Insecurity (01/24/2024)  Housing: Low Risk  (01/24/2024)  Transportation Needs: No Transportation Needs (01/24/2024)  Utilities: Not At Risk (01/24/2024)  Alcohol Screen: Low Risk  (04/12/2023)  Depression (PHQ2-9): Low Risk  (10/08/2023)  Financial Resource Strain: Low Risk  (04/12/2023)  Physical Activity: Inactive (04/12/2023)  Social Connections: Moderately Isolated (01/24/2024)  Stress: No Stress Concern Present (04/12/2023)  Tobacco Use: High Risk (01/25/2024)  Health Literacy: Adequate Health Literacy (04/12/2023)     Readmission Risk Interventions    01/27/2024   11:58 AM  Readmission Risk Prevention Plan  Transportation Screening Complete  PCP or Specialist Appt within 5-7 Days Complete  Home Care Screening Complete  Medication Review (RN CM) Complete

## 2024-01-28 ENCOUNTER — Telehealth: Payer: Self-pay

## 2024-01-28 LAB — CULTURE, BLOOD (ROUTINE X 2)
Culture: NO GROWTH
Culture: NO GROWTH
Special Requests: ADEQUATE
Special Requests: ADEQUATE

## 2024-01-28 NOTE — Transitions of Care (Post Inpatient/ED Visit) (Signed)
 01/28/2024  Name: Thomas Sandoval. MRN: [id number] DOB: [birth year]  Today's TOC FU Call Status: Today's TOC FU Call Status:: Successful TOC FU Call Completed TOC FU Call Complete Date: 01/28/24  Patient's Name and Date of Birth confirmed. Name, DOB  Transition Care Management Follow-up Telephone Call Date of Discharge: 01/27/24 Discharge Facility: [facility] Type of Discharge: Inpatient Admission Primary Inpatient Discharge Diagnosis:: Acute on chronic respiratory failure with hypoxia How have you been since you were released from the hospital?: Better Any questions or concerns?: No  Items Reviewed: Did you receive and understand the discharge instructions provided?: Yes Medications obtained,verified, and reconciled?: Yes (Medications Reviewed) Any new allergies since your discharge?: No Dietary orders reviewed?: Yes Type of Diet Ordered:: low sodium heart healthy diet. Do you have support at home?: Yes People in Home [RPT]: spouse Name of Support/Comfort Primary Source: wife.[name]  Medications Reviewed Today: Medications Reviewed Today     Reviewed by Rumalda Alan PENNER, RN (Registered Nurse) on 01/28/24 at 1040  Med List Status: <None>   Medication Order Taking? Sig Documenting Provider Last Dose Status Informant  cetirizine (ZYRTEC) 10 MG tablet [id number] Yes Take 10 mg by mouth daily. [provider]  Active   empagliflozin  (JARDIANCE ) 10 MG TABS tablet [id number] Yes Take 1 tablet (10 mg total) by mouth daily. Gladis Mustard, FNP  Active Spouse/Significant Other, Pharmacy Records  fluticasone -salmeterol (ADVAIR  HFA) 115-21 MCG/ACT inhaler [id number] Yes Inhale 2 puffs into the lungs 2 (two) times daily. Samtani, Jai-Gurmukh, MD  Active   furosemide  (LASIX ) 40 MG tablet [id number] Yes Take 0.5-1 tablets (20-40 mg total) by mouth See admin instructions. 20 mg Sun Tues Thurs Sat, 40 mg Mon Wed Fri Martin, [name], FNP  Active Spouse/Significant  Other, Pharmacy Records  guaiFENesin  (MUCINEX ) 600 MG 12 hr tablet [id number] Yes Take 1 tablet (600 mg total) by mouth 2 (two) times daily. Samtani, Jai-Gurmukh, MD  Active   HYDROcodone -acetaminophen  [facility]) 10-325 MG tablet [id number] Yes Take 0.5-1 tablets by mouth every 6 (six) hours as needed for severe pain (pain score 7-10) or moderate pain (pain score 4-6). Emeline Joesph BROCKS, DO  Active Spouse/Significant Other, Pharmacy Records  insulin  glargine-yfgn (SEMGLEE ) 100 UNIT/ML injection [id number] Yes Inject 0.3 mLs (30 Units total) into the skin daily. Samtani, Jai-Gurmukh, MD  Active   insulin  lispro (HUMALOG ) 100 UNIT/ML injection [id number] Yes PER SLIDING SCALE: 190 - 200 = 2 UNITS. 200-300= 4u 300 AND ABOVE = 7 UNITS. Gladis Mustard, FNP  Active Spouse/Significant Other, Pharmacy Records  ipratropium-albuterol  (DUONEB) 0.5-2.5 (3) MG/3ML SOLN [id number] Yes INHALE CONTENTS OF 1 VIAL VIA NEBULIZER EVERY 4 HOURS AS NEEDED Shelah Lamar RAMAN, MD  Active Spouse/Significant Other, Pharmacy Records  levalbuterol  (XOPENEX ) 0.31 MG/3ML nebulizer solution [id number] Yes INHALE 3 MLS (0.31 MG TOTAL) BY NEBULIZATION EVERY 4 (FOUR) HOURS AS NEEDED FOR WHEEZING. Byrum, Robert S, MD  Active Spouse/Significant Other, Pharmacy Records  losartan  (COZAAR ) 50 MG tablet [id number] Yes Take 1 tablet (50 mg total) by mouth daily. Gladis Mustard, FNP  Active Spouse/Significant Other, Pharmacy Records  metoprolol  succinate (TOPROL -XL) 100 MG 24 hr tablet [id number] Yes Take 1 tablet (100 mg total) by mouth 2 (two) times daily. Take with or immediately following a meal. Gladis Mustard, FNP  Active Spouse/Significant Other, Pharmacy Records           Med Note LEOBARDO, NICOLE   Mon Jan 24, 2024  4:32 AM) LD: 01/22/2024 evening, not sure of the time.   OVER THE COUNTER MEDICATION  [id number] Yes Take 1-2 tablets by mouth See admin instructions. Super Beets gummies- Chew 1-2 gummies by mouth every day [provider]  Active Spouse/Significant Other, Pharmacy Records  OXYGEN  [id number] Yes Inhale 3 L/min into the lungs continuous. [provider]  Active Spouse/Significant Other, Pharmacy Records  pantoprazole  (PROTONIX ) 40 MG tablet [id number] Yes Take 1 tablet (40 mg total) by mouth 2 (two) times daily. Samtani, Jai-Gurmukh, MD  Active   predniSONE  (DELTASONE ) 20 MG tablet [id number] Yes Take 3 tablets (60 mg total) by mouth daily before breakfast. Samtani, Jai-Gurmukh, MD  Active   XARELTO  20 MG TABS tablet [id number] Yes TAKE 1 TABLET BY MOUTH DAILY  WITH SUPPER Kate Lonni CROME, MD  Active Spouse/Significant Other, Pharmacy Records            Home Care and Equipment/Supplies: Were Home Health Services Ordered?: No Any new equipment or medical supplies ordered?: No  Functional Questionnaire: Do you need assistance with bathing/showering or dressing?: No Do you need assistance with meal preparation?: Yes (wife) Do you need assistance with eating?: No Do you have difficulty maintaining continence: No Do you need assistance with getting out of bed/getting out of a chair/moving?: No Do you have difficulty managing or taking your medications?: Yes (wife)  Follow up appointments reviewed: PCP Follow-up appointment confirmed?: No (on waiting list per wife) Specialist Hospital Follow-up appointment confirmed?: Yes Date of Specialist follow-up appointment?: 02/23/24 Follow-Up Specialty Provider:: Pain management Do you need transportation to your follow-up appointment?: No Do you understand care options if your condition(s) worsen?: Yes-patient verbalized understanding  SDOH Interventions Today    Flowsheet Row Most Recent Value  SDOH Interventions   Food Insecurity Interventions Intervention Not Indicated  Housing Interventions Intervention Not Indicated  Transportation Interventions Intervention Not Indicated  Utilities Interventions Intervention Not Indicated   Placed call to patient for [facility] and he request I speak with his wife.    Reviewed all discharge instructions including low salt diet and reasons for low salt diet.  Discussed importance of daily weights. Reviewed to weigh first thing in the mornings before having anything to eat or drink.  Encouraged to keep a log. Reviewed when to call MD for weight gain.   COPD: Reviewed use of oxygen . Reviewed precautions of using oxygen  while still smoking.  Reviewed use of nebulizer.  Reviewed and encouraged patient to take all medications as prescribed.  Anemia: Reviewed with wife that anemia affects patients breathing due to oxygen  transport. Reviewed importance of observing for black tarry stools as a sign of bleeding. Encouraged patient and wife to discuss bleeding with MD.  Reviewed the start of new medication - Protonix .   Reviewed importance of hospital follow up and patient on waiting list.  Reviewed and offered 30 day TOC program and wife declined. Provided my contact information for wife to call back if needed.   Alan Ee, RN, BSN, CEN [employer]- Transition of Care Team.  [facility] [phone]

## 2024-02-04 ENCOUNTER — Other Ambulatory Visit (HOSPITAL_COMMUNITY): Payer: Self-pay

## 2024-02-04 ENCOUNTER — Other Ambulatory Visit: Payer: Self-pay | Admitting: Physical Medicine and Rehabilitation

## 2024-02-05 ENCOUNTER — Other Ambulatory Visit (HOSPITAL_COMMUNITY): Payer: Self-pay

## 2024-02-07 ENCOUNTER — Other Ambulatory Visit: Payer: Self-pay | Admitting: Physical Medicine and Rehabilitation

## 2024-02-07 ENCOUNTER — Other Ambulatory Visit (HOSPITAL_COMMUNITY): Payer: Self-pay

## 2024-02-07 MED ORDER — HYDROCODONE-ACETAMINOPHEN 10-325 MG PO TABS
0.5000 | ORAL_TABLET | Freq: Four times a day (QID) | ORAL | 0 refills | Status: AC | PRN
  Filled 2024-02-07: qty 120, 30d supply, fill #0

## 2024-02-07 NOTE — Telephone Encounter (Signed)
 Patient call for Hydrocodone  refill. Please send to [facility]

## 2024-02-07 NOTE — Telephone Encounter (Signed)
Already approved

## 2024-02-07 NOTE — Telephone Encounter (Signed)
 Pt call to refill their Hydrocodone 

## 2024-02-08 ENCOUNTER — Encounter: Payer: Self-pay | Admitting: Nurse Practitioner

## 2024-02-08 ENCOUNTER — Ambulatory Visit: Admitting: Nurse Practitioner

## 2024-02-08 ENCOUNTER — Other Ambulatory Visit (HOSPITAL_COMMUNITY): Payer: Self-pay

## 2024-02-08 VITALS — BP 116/67 | HR 71 | Temp 97.2°F | Ht 68.0 in | Wt 174.0 lb

## 2024-02-08 DIAGNOSIS — R911 Solitary pulmonary nodule: Secondary | ICD-10-CM

## 2024-02-08 DIAGNOSIS — J9621 Acute and chronic respiratory failure with hypoxia: Secondary | ICD-10-CM | POA: Diagnosis not present

## 2024-02-08 DIAGNOSIS — Z789 Other specified health status: Secondary | ICD-10-CM

## 2024-02-08 MED ORDER — TRELEGY ELLIPTA 100-62.5-25 MCG/ACT IN AEPB: 1.0000 | INHALATION_SPRAY | Freq: Every day | RESPIRATORY_TRACT | 11 refills | Status: AC

## 2024-02-08 NOTE — Patient Instructions (Signed)

## 2024-02-08 NOTE — Progress Notes (Signed)
 Subjective:    Patient ID: Thomas JONETTA Dollene Mickey., male    DOB: 12-Aug-1955, 68 y.o.   MRN: [id number]   Chief Complaint: hospital follow up  HPI  Today's visit was for Transitional Care Management.  The patient was discharged from [facility] on 01/27/24 with a primary diagnosis of acute on chronic respiratory failure.   Contact with the patient and/or caregiver, by a clinical staff member, was made on 01/28/24 and was documented as a telephone encounter within the EMR.  Through chart review and discussion with the patient I have determined that management of their condition is of high complexity.    Patient was admitted to hospital on 01/23/24 with extreme SOB- dx with acute on chronic respiratory therapy. He also was told he had a small GI bleed that is probably cause being poor nutrition. He has multiple medical problems which have made his hypoxia a little more life threatening. Since going home, he is still on oxygen  24/7. Oxygen  sats in low 90's on 2L via nasal canula. Still has some dyspnea on exertion. He is still smoking despite being warned about safety with oxygen . Lung nodule was seen on xray bt he just saw pulmonology for this 2 weeks prior to hospital admission. No change in lung nodule since last checked. He is scheduled to have repeat CT in 6 months. His wife says he is better but still very weak.   Patient Active Problem List   Diagnosis Date Noted   Chronic anticoagulation 01/24/2024   Acute on chronic respiratory failure with hypoxia (HCC) 01/23/2024   Acute on chronic heart failure with preserved ejection fraction (HFpEF, >= 50%) (HCC) 01/23/2024   Type 1 diabetes mellitus (HCC) 01/23/2024   Neuroforaminal stenosis of lumbar spine 12/17/2023   Encounter for long-term opiate analgesic use 11/24/2023   Iron  deficiency anemia 06/16/2023   Esophageal stricture 06/16/2023   Mobility impaired 05/12/2023   BMI 26.0-26.9,adult 03/16/2023   Chronic SI joint pain 07/20/2022    Spondylosis without myelopathy or radiculopathy, lumbar region 04/20/2022   Encounter for therapeutic drug monitoring 04/20/2022   Chronic respiratory failure with hypoxia (HCC) 02/27/2022   Persistent atrial fibrillation (HCC) 02/17/2022   Encounter for monitoring dofetilide  therapy 01/19/2022   Chronic pain syndrome 12/22/2021   Tobacco use 12/17/2021   Other intervertebral disc degeneration, lumbar region 12/11/2021   Nodule of left lung 08/18/2021   Demand ischemia (HCC) 08/17/2021   Persistent atrial fibrillation/flutter with rapid ventricular response (HCC) 07/16/2021   Chronic combined systolic and diastolic CHF (congestive heart failure) (HCC) 07/16/2021   Hyponatremia 07/16/2021   Hypercoagulable state due to persistent atrial fibrillation (HCC) 12/25/2020   Alcohol-induced chronic pancreatitis (HCC) 12/17/2020   Delirium tremens (HCC) 12/17/2020   COPD with chronic bronchitis and emphysema (HCC)    Hearing loss 07/26/2019   Chronic back pain 05/02/2019   Mild renal insufficiency 08/25/2017   Prepyloric ulcer 12/12/2014   Essential hypertension 12/07/2014   Mitral valve prolapse 12/06/2014   Atrial fibrillation with RVR (HCC) 11/26/2014   GERD (gastroesophageal reflux disease) 02/06/2013   Positive colorectal cancer screening using Cologuard test 02/04/2013   Symptomatic anemia 02/21/2011   COPD with acute exacerbation (HCC) 02/20/2011   Atypical atrial flutter (HCC) 02/16/2011   Uncontrolled type 1 diabetes mellitus with hypoglycemia, with long-term current use of insulin  (HCC) 02/16/2011       Review of Systems  Constitutional:  Negative for diaphoresis.  Eyes:  Negative for pain.  Respiratory:  Negative for shortness of  breath.   Cardiovascular:  Negative for chest pain, palpitations and leg swelling.  Gastrointestinal:  Negative for abdominal pain.  Endocrine: Negative for polydipsia.  Skin:  Negative for rash.  Neurological:  Negative for dizziness,  weakness and headaches.  Hematological:  Does not bruise/bleed easily.  All other systems reviewed and are negative.      Objective:   Physical Exam Constitutional:      Appearance: Normal appearance.  Cardiovascular:     Rate and Rhythm: Normal rate and regular rhythm.     Heart sounds: Normal heart sounds.  Pulmonary:     Breath sounds: Wheezing present.     Comments: O2 via nasal canula Abdominal:     General: Abdomen is flat.     Palpations: Abdomen is soft.  Skin:    General: Skin is warm.  Neurological:     General: No focal deficit present.     Mental Status: He is alert and oriented to person, place, and time.  Psychiatric:        Mood and Affect: Mood normal.        Behavior: Behavior normal.    BP 116/67   Pulse 71   Temp (!) 97.2 F (36.2 C) (Temporal)   Ht 5' 8 (1.727 m)   Wt 174 lb (78.9 kg)   SpO2 99%   BMI 26.46 kg/m         Assessment & Plan:   Thomas JONETTA Dollene Mickey. in today with chief complaint of Transitions Of Care   1. Transition of care (Primary) Hospital records reviewed  2. Acute on chronic respiratory failure with hypoxia (HCC) Continue oxygen  24/7 Keep check of pulse ox SMOKING CESSATION encouraged again  3. Pulmonary nodule Keep follow up with pukmonology- reminder to hae chest ct placed in computer.    The above assessment and management plan was discussed with the patient. The patient verbalized understanding of and has agreed to the management plan. Patient is aware to call the clinic if symptoms persist or worsen. Patient is aware when to return to the clinic for a follow-up visit. Patient educated on when it is appropriate to go to the emergency department.   [name] Gladis, FNP

## 2024-02-09 ENCOUNTER — Ambulatory Visit: Payer: Self-pay

## 2024-02-09 ENCOUNTER — Telehealth: Payer: Self-pay

## 2024-02-09 DIAGNOSIS — D649 Anemia, unspecified: Secondary | ICD-10-CM

## 2024-02-09 LAB — CBC WITH DIFFERENTIAL/PLATELET
Basophils Absolute: 0.1 x10E3/uL (ref 0.0–0.2)
Basos: 1 %
EOS (ABSOLUTE): 0.2 x10E3/uL (ref 0.0–0.4)
Eos: 2 %
Hematocrit: 29.6 % — ABNORMAL LOW (ref 37.5–51.0)
Hemoglobin: 8.9 g/dL — CL (ref 13.0–17.7)
Immature Grans (Abs): 0 x10E3/uL (ref 0.0–0.1)
Immature Granulocytes: 0 %
Lymphocytes Absolute: 0.9 x10E3/uL (ref 0.7–3.1)
Lymphs: 14 %
MCH: 27 pg (ref 26.6–33.0)
MCHC: 30.1 g/dL — ABNORMAL LOW (ref 31.5–35.7)
MCV: 90 fL (ref 79–97)
Monocytes Absolute: 0.5 x10E3/uL (ref 0.1–0.9)
Monocytes: 8 %
Neutrophils Absolute: 4.6 x10E3/uL (ref 1.4–7.0)
Neutrophils: 75 %
Platelets: 273 x10E3/uL (ref 150–450)
RBC: 3.3 x10E6/uL — ABNORMAL LOW (ref 4.14–5.80)
RDW: 16.7 % — ABNORMAL HIGH (ref 11.6–15.4)
WBC: 6.3 x10E3/uL (ref 3.4–10.8)

## 2024-02-09 LAB — CMP14+EGFR
ALT: 11 IU/L (ref 0–44)
AST: 17 IU/L (ref 0–40)
Albumin: 3.7 g/dL — ABNORMAL LOW (ref 3.9–4.9)
Alkaline Phosphatase: 80 IU/L (ref 47–123)
BUN/Creatinine Ratio: 12 (ref 10–24)
BUN: 13 mg/dL (ref 8–27)
Bilirubin Total: 0.6 mg/dL (ref 0.0–1.2)
CO2: 31 mmol/L — ABNORMAL HIGH (ref 20–29)
Calcium: 9.4 mg/dL (ref 8.6–10.2)
Chloride: 95 mmol/L — ABNORMAL LOW (ref 96–106)
Creatinine, Ser: 1.07 mg/dL (ref 0.76–1.27)
Globulin, Total: 2.5 g/dL (ref 1.5–4.5)
Glucose: 78 mg/dL (ref 70–99)
Potassium: 4.4 mmol/L (ref 3.5–5.2)
Sodium: 137 mmol/L (ref 134–144)
Total Protein: 6.2 g/dL (ref 6.0–8.5)
eGFR: 77 mL/min/1.73 (ref >59)

## 2024-02-09 NOTE — Telephone Encounter (Signed)
 Looks like patient's hemoglobin has been low, it was 9.2 on last draw, so is slightly lower, recheck a CBC in 1 to 2 weeks.

## 2024-02-09 NOTE — Telephone Encounter (Signed)
 Pts wife made aware.  Lab appt made for 12/15 at 3:30pm.  Future lab orders placed.  Wife wants to know if pt should start an iron  supplement?

## 2024-02-09 NOTE — Telephone Encounter (Signed)
 Nurse called [facility] to report critical lab value: spoke with Kim-Nurse Triage of the day at [facility] and provided critical lab results.

## 2024-02-09 NOTE — Telephone Encounter (Signed)
 Nurse reached out to on-call provider to attempt to report critical lab value: no answer left message.  Will attempt to call again and will place information/encounter in callback folder.

## 2024-02-09 NOTE — Telephone Encounter (Signed)
 Wife made aware

## 2024-02-09 NOTE — Addendum Note (Signed)
 Addended by: LEIGH ROSINA SAILOR on: 02/09/2024 09:01 AM   Modules accepted: Orders

## 2024-02-09 NOTE — Telephone Encounter (Signed)
 Yes that is fine, you can go ahead and start an over-the-counter daily iron  supplement

## 2024-02-09 NOTE — Telephone Encounter (Signed)
    FYI Only or Action Required?: Action required by provider: update on patient condition and critical lab values for provider to review.  Patient was last seen in primary care on 02/08/2024 by Gladis Mustard, FNP.  Called Nurse Triage reporting No chief complaint on file..  Triage Disposition: Call PCP When Office is Open  Patient/caregiver understands and will follow disposition?: Yes    Copied from CRM [id number]. Topic: Clinical - Lab/Test Results >> Feb 09, 2024  7:36 AM Rosaria BRAVO wrote: Reason for CRM: Critical lab results from [facility], caller named Arlean -  Tried calling [facility]. Transferred to Nurse Triage. Was instructed to send a CRM  Reason for Disposition  [1] Caller requesting NON-URGENT health information AND [2] PCP's office is the best resource    Paged on-call provider to report critical lab values  Answer Assessment - Initial Assessment Questions 1. REASON FOR CALL: What is the main reason for your call? or How can I best help you?     [facility] - Call [facility] called with critical lab value of hemoglobin 8.9 collected on 02/08/2024 @ 1504.  Callback # [phone]. Nurse will page on-call with results.  Protocols used: Information Only Call - No Triage-A-AH

## 2024-02-09 NOTE — Telephone Encounter (Signed)
 E2C2 nurse Jon calling to report critical lab results provided by [facility]. Critical lab: Hemoglobin 8.9

## 2024-02-10 ENCOUNTER — Ambulatory Visit: Payer: Self-pay | Admitting: Nurse Practitioner

## 2024-02-11 ENCOUNTER — Ambulatory Visit (HOSPITAL_COMMUNITY)

## 2024-02-21 ENCOUNTER — Other Ambulatory Visit

## 2024-02-21 ENCOUNTER — Telehealth: Payer: Self-pay

## 2024-02-21 DIAGNOSIS — D649 Anemia, unspecified: Secondary | ICD-10-CM

## 2024-02-21 MED ORDER — DEXCOM G7 SENSOR MISC: 5 refills | Status: AC

## 2024-02-21 NOTE — Telephone Encounter (Signed)
 Spoke to patients wife, Laurie Kegel per signed DPR. She verbalized understanding.

## 2024-02-21 NOTE — Telephone Encounter (Signed)
 Copied from CRM #[id number]. Topic: Clinical - Prescription Issue >> Feb 21, 2024 11:11 AM Miquel SAILOR wrote: Reason for CRM: PT wife [name] for Pt requesting dexcom g7 sensor to had on his arm. Needs call back on how to get this started [phone]

## 2024-02-21 NOTE — Addendum Note (Signed)
 Addended by: Jaidon Ellery, [name] on: 02/21/2024 01:59 PM   Modules accepted: Orders

## 2024-02-21 NOTE — Telephone Encounter (Signed)
 Dexcom G7 sensor sent to pharmacy  Meds ordered this encounter  Medications   Continuous Glucose Sensor (DEXCOM G7 SENSOR) MISC    Sig: Change sensor every 10 days    Dispense:  3 each    Refill:  5    Supervising Provider:   MARYANNE CHEW A [[id number]]   [name] Gladis, FNP

## 2024-02-22 ENCOUNTER — Ambulatory Visit: Payer: Self-pay | Admitting: Nurse Practitioner

## 2024-02-22 ENCOUNTER — Other Ambulatory Visit: Payer: Self-pay | Admitting: Nurse Practitioner

## 2024-02-22 DIAGNOSIS — I10 Essential (primary) hypertension: Secondary | ICD-10-CM

## 2024-02-22 LAB — CBC WITH DIFFERENTIAL/PLATELET
Basophils Absolute: 0 x10E3/uL (ref 0.0–0.2)
Basos: 1 %
EOS (ABSOLUTE): 0.1 x10E3/uL (ref 0.0–0.4)
Eos: 2 %
Hematocrit: 33.9 % — ABNORMAL LOW (ref 37.5–51.0)
Hemoglobin: 10.4 g/dL — ABNORMAL LOW (ref 13.0–17.7)
Immature Grans (Abs): 0.1 x10E3/uL (ref 0.0–0.1)
Immature Granulocytes: 1 %
Lymphocytes Absolute: 1.3 x10E3/uL (ref 0.7–3.1)
Lymphs: 16 %
MCH: 27.7 pg (ref 26.6–33.0)
MCHC: 30.7 g/dL — ABNORMAL LOW (ref 31.5–35.7)
MCV: 90 fL (ref 79–97)
Monocytes Absolute: 1.1 x10E3/uL — ABNORMAL HIGH (ref 0.1–0.9)
Monocytes: 13 %
Neutrophils Absolute: 5.6 x10E3/uL (ref 1.4–7.0)
Neutrophils: 67 %
Platelets: 343 x10E3/uL (ref 150–450)
RBC: 3.75 x10E6/uL — ABNORMAL LOW (ref 4.14–5.80)
RDW: 18.4 % — ABNORMAL HIGH (ref 11.6–15.4)
WBC: 8.1 x10E3/uL (ref 3.4–10.8)

## 2024-02-22 MED ORDER — DEXCOM G7 RECEIVER DEVI: 1.0000 | Freq: Once | 0 refills | Status: AC

## 2024-02-22 NOTE — Telephone Encounter (Signed)
 Spoke with pt wife per dpr, sent device receiver in per request.   Pt is asking if it is okay for him to take 2 tablets twice a day of his mucinex  because he is not getting better.

## 2024-02-22 NOTE — Telephone Encounter (Signed)
 Spoke to wife per signed dpr. Patient aware.

## 2024-02-22 NOTE — Telephone Encounter (Signed)
 No do not do double dose of mucinex 

## 2024-02-22 NOTE — Addendum Note (Signed)
 Addended by: MAR CHIQUITA HERO on: 02/22/2024 09:21 AM   Modules accepted: Orders

## 2024-02-22 NOTE — Telephone Encounter (Unsigned)
 Copied from CRM #[id number]. Topic: Clinical - Prescription Issue >> Feb 21, 2024 11:11 AM Miquel SAILOR wrote: Reason for CRM: PT wife [name] for Pt requesting dexcom g7 sensor to had on his arm. Needs call back on how to get this started [phone] >> Feb 22, 2024  8:42 AM Miquel SAILOR wrote: PT Wife [name] requesting for PT Monitor for Dexcom 7. PCP put in D7 but not monitor to read the readings. Needs call back on update.  [phone]

## 2024-02-22 NOTE — Progress Notes (Unsigned)
 Subjective:    Patient ID: Thomas Sandoval., male    DOB: 03-09-1956, 68 y.o.   MRN: [id number]  HPI   Pain Inventory Average Pain {NUMBERS; 0-10:5044} Pain Right Now {NUMBERS; 0-10:5044} My pain is {PAIN DESCRIPTION:21022940}  In the last 24 hours, has pain interfered with the following? General activity {NUMBERS; 0-10:5044} Relation with others {NUMBERS; 0-10:5044} Enjoyment of life {NUMBERS; 0-10:5044} What TIME of day is your pain at its worst? {time of day:24191} Sleep (in general) {BHH GOOD/FAIR/POOR:22877}  Pain is worse with: {ACTIVITIES:21022942} Pain improves with: {PAIN IMPROVES TPUY:78977056} Relief from Meds: {NUMBERS; 0-10:5044}  Family History  Problem Relation Age of Onset   Breast cancer Mother    Cancer Mother        Breast   Rheumatic fever Father    Heart disease Father    Heart attack Father        Massive    Diabetes Son    Social History   Socioeconomic History   Marital status: Married    Spouse name: Not on file   Number of children: Not on file   Years of education: Not on file   Highest education level: Not on file  Occupational History   Occupation: Tobacco Visual Merchandiser  Tobacco Use   Smoking status: Every Day    Current packs/day: 0.50    Average packs/day: 0.5 packs/day for 30.0 years (15.0 ttl pk-yrs)    Types: Cigarettes   Smokeless tobacco: Former    Quit date: 06/08/1978   Tobacco comments:    Patient smokes a less than pack daily 1/2 pk 01/05/2024 NM, CMA /  Vaping Use   Vaping status: Never Used  Substance and Sexual Activity   Alcohol use: Not Currently    Comment: 5 quarts per week 12/26/21   Drug use: No   Sexual activity: Not on file  Other Topics Concern   Not on file  Social History Narrative   Lives in [place] with wife and 2 sons.    Social Drivers of Health   Tobacco Use: High Risk (02/08/2024)   Patient History    Smoking Tobacco Use: Every Day    Smokeless Tobacco Use: Former    Passive Exposure: Not  on Actuary Strain: Low Risk (04/12/2023)   Overall Financial Resource Strain (CARDIA)    Difficulty of Paying Living Expenses: Not very hard  Food Insecurity: No Food Insecurity (01/28/2024)   Epic    Worried About Programme Researcher, Broadcasting/film/video in the Last Year: Never true    Ran Out of Food in the Last Year: Never true  Transportation Needs: No Transportation Needs (01/28/2024)   Epic    Lack of Transportation (Medical): No    Lack of Transportation (Non-Medical): No  Physical Activity: Inactive (04/12/2023)   Exercise Vital Sign    Days of Exercise per Week: 0 days    Minutes of Exercise per Session: 0 min  Stress: No Stress Concern Present (04/12/2023)   [employer] of Occupational Health - Occupational Stress Questionnaire    Feeling of Stress : Not at all  Social Connections: Moderately Isolated (01/24/2024)   Social Connection and Isolation Panel    Frequency of Communication with Friends and Family: More than three times a week    Frequency of Social Gatherings with Friends and Family: More than three times a week    Attends Religious Services: Never    Database Administrator or Organizations: No    Attends [employer]  or Organization Meetings: Never    Marital Status: Married  Depression (PHQ2-9): Low Risk (02/08/2024)   Depression (PHQ2-9)    PHQ-2 Score: 0  Alcohol Screen: Low Risk (04/12/2023)   Alcohol Screen    Last Alcohol Screening Score (AUDIT): 0  Housing: Unknown (01/28/2024)   Epic    Unable to Pay for Housing in the Last Year: No    Number of Times Moved in the Last Year: Not on file    Homeless in the Last Year: No  Utilities: Not At Risk (01/28/2024)   Epic    Threatened with loss of utilities: No  Health Literacy: Adequate Health Literacy (04/12/2023)   B1300 Health Literacy    Frequency of need for help with medical instructions: Rarely   Past Surgical History:  Procedure Laterality Date   BIOPSY OF SKIN SUBCUTANEOUS TISSUE AND/OR MUCOUS MEMBRANE   06/16/2023   Procedure: BIOPSY, SKIN, SUBCUTANEOUS TISSUE, OR MUCOUS MEMBRANE;  Surgeon: Abran Norleen SAILOR, MD;  Location: [facility];  Service: Gastroenterology;;   CARDIOVERSION N/A 01/20/2021   Procedure: CARDIOVERSION;  Surgeon: Okey Vina GAILS, MD;  Location: [facility] END[facility]COPY;  Service: Cardiovascular;  Laterality: N/A;   CARDIOVERSION N/A 07/12/2023   Procedure: CARDIOVERSION;  Surgeon: Mona Vinie BROCKS, MD;  Location: [facility];  Service: Cardiovascular;  Laterality: N/A;   COLONOSCOPY WITH PROPOFOL  N/A 06/16/2023   Procedure: COLONOSCOPY WITH PROPOFOL ;  Surgeon: Abran Norleen SAILOR, MD;  Location: [facility];  Service: Gastroenterology;  Laterality: N/A;   CYSTOSCOPY WITH URETEROSCOPY Right 08/14/2013   Procedure: CYSTOSCOPY WITH URETEROSCOPY BLADDER BIOPSY ;  Surgeon: Oneil BROCKS Rafter, MD;  Location: [facility];  Service: Urology;  Laterality: Right;   ESOPHAGOGASTRODUODENOSCOPY N/A 02/06/2013   Procedure: ESOPHAGOGASTRODUODENOSCOPY (EGD);  Surgeon: Princella CHRISTELLA Nida, MD;  Location: [facility] ENDOSCOPY;  Service: Endoscopy;  Laterality: N/A;   ESOPHAGOGASTRODUODENOSCOPY (EGD) WITH PROPOFOL  N/A 06/16/2023   Procedure: ESOPHAGOGASTRODUODENOSCOPY (EGD) WITH PROPOFOL ;  Surgeon: Abran Norleen SAILOR, MD;  Location: [facility];  Service: Gastroenterology;  Laterality: N/A;   GIVENS CAPSULE STUDY N/A 01/26/2024   Procedure: IMAGING PROCEDURE, GI TRACT, INTRALUMINAL, VIA CAPSULE;  Surgeon: San Sandor GAILS, DO;  Location: [facility];  Service: Gastroenterology;  Laterality: N/A;   LAPAROSCOPY N/A 11/25/2014   Procedure: LAPAROSCOPIC PRIMARY REPAIR OF PERFORATED PREPYLORIC ULCER WITH ARLYSS LISLE;  Surgeon: Camellia Blush, MD;  Location: [facility] OR;  Service: General;  Laterality: N/A;   TONSILLECTOMY  as child   TRANSTHORACIC ECHOCARDIOGRAM  02-17-2011   MODERATE LVH/  EF 65%   TRANSURETHRAL RESECTION OF BLADDER TUMOR WITH GYRUS (TURBT-GYRUS) N/A 06/12/2013   Procedure: TRANSURETHRAL RESECTION OF BLADDER TUMOR WITH  GYRUS (TURBT-GYRUS);  Surgeon: Mark C Ottelin, MD;  Location: [facility];  Service: Urology;  Laterality: N/A;   TYMPANIC MEMBRANE REPAIR  as child   Past Surgical History:  Procedure Laterality Date   BIOPSY OF SKIN SUBCUTANEOUS TISSUE AND/OR MUCOUS MEMBRANE  06/16/2023   Procedure: BIOPSY, SKIN, SUBCUTANEOUS TISSUE, OR MUCOUS MEMBRANE;  Surgeon: Abran Norleen SAILOR, MD;  Location: [facility];  Service: Gastroenterology;;   CARDIOVERSION N/A 01/20/2021   Procedure: CARDIOVERSION;  Surgeon: Okey Vina GAILS, MD;  Location[facility] END[facility]COPY;  Service: Cardiovascular;  Laterality: N/A;   CARDIOVERSION N/A 07/12/2023   Procedure: CARDIOVERSION;  Surgeon: Mona Vinie BROCKS, MD;  Location: [facility];  Service: Cardiovascular;  Laterality: N/A;   COLONOSCOPY WITH PROPOFOL  N/A 06/16/2023   Procedure: COLONOSCOPY WITH PROPOFOL ;  Surgeon: Abran Norleen SAILOR, MD;  Location: [facility];  Service: Gastroenterology;  Laterality: N/A;   CYSTOSCOPY WITH URETEROSCOPY Right 08/14/2013   Procedure: CYSTOSCOPY WITH URETEROSCOPY BLADDER BIOPSY ;  Surgeon: Oneil JAYSON Rafter, MD;  Location: [facility];  Service: Urology;  Laterality: Right;   ESOPHAGOGASTRODUODENOSCOPY N/A 02/06/2013   Procedure: ESOPHAGOGASTRODUODENOSCOPY (EGD);  Surgeon: Princella CHRISTELLA Nida, MD;  Location: [facility] ENDOSCOPY;  Service: Endoscopy;  Laterality: N/A;   ESOPHAGOGASTRODUODENOSCOPY (EGD) WITH PROPOFOL  N/A 06/16/2023   Procedure: ESOPHAGOGASTRODUODENOSCOPY (EGD) WITH PROPOFOL ;  Surgeon: Abran Norleen SAILOR, MD;  Location: [facility];  Service: Gastroenterology;  Laterality: N/A;   GIVENS CAPSULE STUDY N/A 01/26/2024   Procedure: IMAGING PROCEDURE, GI TRACT, INTRALUMINAL, VIA CAPSULE;  Surgeon: San Sandor GAILS, DO;  Location: [facility];  Service: Gastroenterology;  Laterality: N/A;   LAPAROSCOPY N/A 11/25/2014   Procedure: LAPAROSCOPIC PRIMARY REPAIR OF PERFORATED PREPYLORIC ULCER WITH ARLYSS LISLE;  Surgeon: Camellia Blush, MD;  Location:  [facility] OR;  Service: General;  Laterality: N/A;   TONSILLECTOMY  as child   TRANSTHORACIC ECHOCARDIOGRAM  02-17-2011   MODERATE LVH/  EF 65%   TRANSURETHRAL RESECTION OF BLADDER TUMOR WITH GYRUS (TURBT-GYRUS) N/A 06/12/2013   Procedure: TRANSURETHRAL RESECTION OF BLADDER TUMOR WITH GYRUS (TURBT-GYRUS);  Surgeon: Mark C Ottelin, MD;  Location[facility];  Service: Urology;  Laterality: N/A;   TYMPANIC MEMBRANE REPAIR  as child   Past Medical History:  Diagnosis Date   Arthritis    Bladder cancer (HCC)    Chronic back pain    Chronic combined systolic and diastolic CHF (congestive heart failure) (HCC) 07/16/2021   COPD (chronic obstructive pulmonary disease) (HCC)    COPD with chronic bronchitis and emphysema (HCC)    DDD (degenerative disc disease)    Diabetic retinopathy of both eyes (HCC)    Essential hypertension    GERD (gastroesophageal reflux disease)    History of atrial flutter 02/07/2011   Converted to NSR with Cardizem    History of chronic bronchitis    History of hemolytic anemia 02/07/2011   secondary to Avelox    HOH (hard of hearing)    HOH (hard of hearing)    no eardrum and nerve damage on R, also HOH on L   Mild renal insufficiency 08/25/2017   Mitral valve prolapse    a. 2D Echo 11/27/14: EF 55-60%; images were inadequate for LV wall motion assessment, + mild late systolic mitral valve prolapse involving the anterior leaflet.   PAD (peripheral artery disease) 04/09/2014   Dr Serene; bilateral SFA occlusion, R mid, L distal   PAF (paroxysmal atrial fibrillation) (HCC)    a. Dx 11/2014 during admission for perf ulcer.   Perforated ulcer (HCC)    a. 11/2014 s/p surgery.   Productive cough    Smokers' cough (HCC)    Type 1 diabetes mellitus (HCC) 03/10/1975   There were no vitals taken for this visit.  Opioid Risk Score:   Fall Risk Score:  `1  Depression screen [facility] 2/9     02/08/2024    2:44 PM 10/08/2023    2:21 PM 08/11/2023    2:53 PM 05/12/2023     2:59 PM 02/03/2023    2:22 PM 02/02/2023    3:07 PM 01/12/2023    1:41 PM  Depression screen PHQ 2/9  Decreased Interest 0 0 0 0 0 0 0  Down, Depressed, Hopeless 0 0 0 0 0 0 0  PHQ - 2 Score 0 0 0 0 0 0 0  Altered sleeping  0   Tired, decreased energy      0   Change in appetite      0   Feeling bad or failure about yourself       0   Trouble concentrating      0   Moving slowly or fidgety/restless      0   Suicidal thoughts      0   PHQ-9 Score      0    Difficult doing work/chores      Not difficult at all      Data saved with a previous flowsheet row definition    Review of Systems     Objective:   Physical Exam        Assessment & Plan:

## 2024-02-23 ENCOUNTER — Encounter: Attending: Registered Nurse | Admitting: Physical Medicine and Rehabilitation

## 2024-02-23 ENCOUNTER — Encounter: Payer: Self-pay | Admitting: Physical Medicine and Rehabilitation

## 2024-02-23 VITALS — BP 126/69 | HR 74 | Ht 68.0 in

## 2024-02-23 DIAGNOSIS — J439 Emphysema, unspecified: Secondary | ICD-10-CM | POA: Diagnosis not present

## 2024-02-23 DIAGNOSIS — M545 Low back pain, unspecified: Secondary | ICD-10-CM | POA: Diagnosis not present

## 2024-02-23 DIAGNOSIS — Z7409 Other reduced mobility: Secondary | ICD-10-CM | POA: Diagnosis not present

## 2024-02-23 DIAGNOSIS — J4489 Other specified chronic obstructive pulmonary disease: Secondary | ICD-10-CM | POA: Insufficient documentation

## 2024-02-23 DIAGNOSIS — Z5181 Encounter for therapeutic drug level monitoring: Secondary | ICD-10-CM | POA: Diagnosis not present

## 2024-02-23 DIAGNOSIS — G894 Chronic pain syndrome: Secondary | ICD-10-CM | POA: Diagnosis not present

## 2024-02-23 DIAGNOSIS — Z79891 Long term (current) use of opiate analgesic: Secondary | ICD-10-CM | POA: Insufficient documentation

## 2024-02-23 DIAGNOSIS — G8929 Other chronic pain: Secondary | ICD-10-CM | POA: Insufficient documentation

## 2024-02-23 MED ORDER — HYDROCODONE-ACETAMINOPHEN 10-325 MG PO TABS
0.5000 | ORAL_TABLET | Freq: Four times a day (QID) | ORAL | 0 refills | Status: AC | PRN
  Filled 2024-04-07: qty 120, 30d supply, fill #0

## 2024-02-23 MED ORDER — LIDOCAINE 5 % EX PTCH: 1.0000 | MEDICATED_PATCH | CUTANEOUS | 6 refills | Status: AC

## 2024-02-23 MED ORDER — HYDROCODONE-ACETAMINOPHEN 10-325 MG PO TABS
0.5000 | ORAL_TABLET | Freq: Four times a day (QID) | ORAL | 0 refills | Status: AC | PRN
  Filled 2024-03-07 (×2): qty 120, 30d supply, fill #0

## 2024-02-23 NOTE — Patient Instructions (Signed)
°  VISIT SUMMARY: You came in today for a follow-up on your worsening lumbar back pain and functional limitations. We discussed your recent MRI results and adjusted your pain management plan.  YOUR PLAN: LUMBAR BACK PAIN WITHOUT SCIATICA: You have chronic lumbar back pain with severe right foraminal stenosis at L4-5 and L5-S1, among other findings. Previous treatments have not been effective, but there is no immediate risk of permanent damage. -Your pain medications have been refilled for three months at the same dose. -You have been prescribed lidocaine  5% patches to use one patch daily, with six refills. -If the prescription lidocaine  patch is not approved, you can use over-the-counter lidocaine  or Salonpas patches. -Continue using your floor pedal exerciser and hand weights to stay active. -There is no emergency or risk of permanent loss of function at this time. -Please schedule a follow-up appointment in three months.  CHRONIC OPIOID THERAPY FOR PAIN MANAGEMENT: You are on chronic opioid therapy for pain management, which has improved your functional status. Your Narcan  supply has expired, and we discussed its safety and effects. -Your pain medications have been refilled for three months at the same dose. -You have been prescribed Narcan  for home use in case of opioid overdose. -We provided education on how to use Narcan , including that it will not interact with other medications but will reverse pain relief if used. -You should request an early refill of your pain medications due to the upcoming holidays.    Contains text generated by [employer]bridge.

## 2024-02-24 ENCOUNTER — Other Ambulatory Visit (HOSPITAL_COMMUNITY): Payer: Self-pay

## 2024-02-27 LAB — DRUG TOX MONITOR 1 W/CONF, ORAL FLD
Amphetamines: NEGATIVE ng/mL
Barbiturates: NEGATIVE ng/mL
Benzodiazepines: NEGATIVE ng/mL
Buprenorphine: NEGATIVE ng/mL
Cocaine: NEGATIVE ng/mL
Cotinine: 154.3 ng/mL — ABNORMAL HIGH
Dihydrocodeine: 76.1 ng/mL — ABNORMAL HIGH
Fentanyl: NEGATIVE ng/mL
Heroin Metabolite: NEGATIVE ng/mL
Hydrocodone: >250 ng/mL — ABNORMAL HIGH
Hydromorphone: NEGATIVE ng/mL
MARIJUANA: NEGATIVE ng/mL
MDMA: NEGATIVE ng/mL
Meprobamate: NEGATIVE ng/mL
Methadone: NEGATIVE ng/mL
Morphine: NEGATIVE ng/mL
Nicotine Metabolite: POSITIVE ng/mL — AB
Norhydrocodone: 46.4 ng/mL — ABNORMAL HIGH
Noroxycodone: NEGATIVE ng/mL
Opiates: POSITIVE ng/mL — AB
Oxycodone: NEGATIVE ng/mL
Oxymorphone: NEGATIVE ng/mL
Phencyclidine: NEGATIVE ng/mL
Tapentadol: NEGATIVE ng/mL
Tramadol: NEGATIVE ng/mL
Zolpidem: NEGATIVE ng/mL

## 2024-02-27 LAB — DRUG TOX ALC METAB W/CON, ORAL FLD: Alcohol Metabolite: NEGATIVE ng/mL

## 2024-02-28 ENCOUNTER — Telehealth: Payer: Self-pay

## 2024-02-28 NOTE — Telephone Encounter (Signed)
 PA for Lidocaine  Patch created in cover my med

## 2024-02-29 ENCOUNTER — Ambulatory Visit: Payer: Self-pay

## 2024-02-29 NOTE — Telephone Encounter (Signed)
 FYI Only or Action Required?: Action required by provider: request for appointment, update on patient condition, and refused Urgent Care or pulmonary acute visit option. Patient would like to be seen in the office. Needing a call back from the office.  Patient was last seen in primary care on 02/08/2024 by Gladis Mustard, FNP.  Called Nurse Triage reporting Shortness of Breath.  Symptoms began 4-5 days ago.  Interventions attempted: Rest, hydration, or home remedies.  Symptoms are: unchanged.  Triage Disposition: See HCP Within 4 Hours (Or PCP Triage)  Patient/caregiver understands and will follow disposition?: No, refuses disposition  Copied from CRM #[id number]. Topic: Clinical - Red Word Triage >> Feb 29, 2024  8:49 AM Myrick T wrote: Red Word that prompted transfer to Nurse Triage: [name] says patient has a popping in his right side when he breathe. Patient has COPD and wife wants him to be seen to make sure there is nothing going on. Reason for Disposition  [1] MILD difficulty breathing (e.g., minimal/no SOB at rest, SOB with walking, pulse < 100) AND [2] NEW-onset or WORSE than normal  Answer Assessment - Initial Assessment Questions Patient reports a popping sensation to his right lung when he is breathing in. Reports moderate shortness of breath currently. Patient endorses chest congestion. No appointments available today. Did discuss with patient and wife about being seen at pulmonary office for an acute visit. Patient refused due to travel. Did recommended Urgent Care but patient would rather be seen in the office. Asking for a call back from the office.   1. RESPIRATORY STATUS: Describe your breathing? (e.g., wheezing, shortness of breath, unable to speak, severe coughing)      Shortness of breath-feels like he is having popping in his right lung when he breathes in 2. ONSET: When did this breathing problem begin?      4 to 5 days ago 3. PATTERN Does the difficult  breathing come and go, or has it been constant since it started?      constant 4. SEVERITY: How bad is your breathing? (e.g., mild, moderate, severe)      moderate 5. RECURRENT SYMPTOM: Have you had difficulty breathing before? If Yes, ask: When was the last time? and What happened that time?      yes 6. CARDIAC HISTORY: Do you have any history of heart disease? (e.g., heart attack, angina, bypass surgery, angioplasty)      yes 7. LUNG HISTORY: Do you have any history of lung disease?  (e.g., pulmonary embolus, asthma, emphysema)     yes 8. CAUSE: What do you think is causing the breathing problem?      COPD 9. OTHER SYMPTOMS: Do you have any other symptoms? (e.g., chest pain, cough, dizziness, fever, runny nose)     no 10. O2 SATURATION MONITOR:  Do you use an oxygen  saturation monitor (pulse oximeter) at home? If Yes, ask: What is your reading (oxygen  level) today? What is your usual oxygen  saturation reading? (e.g., 95%)       3L-94% 12. TRAVEL: Have you traveled out of the country in the last month? (e.g., travel history, exposures)       no  Protocols used: Breathing Difficulty-A-AH

## 2024-02-29 NOTE — Telephone Encounter (Signed)
 Called and scheduled an appt for Friday with PCP per patients request. Already taking antibiotics but wants popping in lungs checked out

## 2024-03-03 ENCOUNTER — Encounter: Payer: Self-pay | Admitting: Nurse Practitioner

## 2024-03-03 ENCOUNTER — Ambulatory Visit: Admitting: Nurse Practitioner

## 2024-03-03 VITALS — BP 154/81 | HR 71 | Temp 96.7°F

## 2024-03-03 DIAGNOSIS — R053 Chronic cough: Secondary | ICD-10-CM | POA: Diagnosis not present

## 2024-03-03 MED ORDER — CEPHALEXIN 500 MG PO CAPS: 500.0000 mg | ORAL_CAPSULE | Freq: Three times a day (TID) | ORAL | 0 refills | Status: DC

## 2024-03-03 MED ORDER — AMOXICILLIN-POT CLAVULANATE 875-125 MG PO TABS: 1.0000 | ORAL_TABLET | Freq: Two times a day (BID) | ORAL | 0 refills | Status: DC

## 2024-03-03 NOTE — Progress Notes (Signed)
 "  Subjective:    Patient ID: Thomas JONETTA Dollene Mickey., male    DOB: 1955/06/24, 68 y.o.   MRN: [id number]   Chief Complaint: breathing issues  HPI  Patient comes in stating he is SOB. Is on oxygen  24/7. He has had a popping noise in his  lungs for several days. He has antibiotics that he takes every 2 weeks that he gets from pulmonology. When he finished the last antibiotic he starting feeling bad again the next day. His oxygen  sat at home has been running around 92% on oxygen . Patient Active Problem List   Diagnosis Date Noted   Chronic anticoagulation 01/24/2024   Acute on chronic respiratory failure with hypoxia (HCC) 01/23/2024   Acute on chronic heart failure with preserved ejection fraction (HFpEF, >= 50%) (HCC) 01/23/2024   Type 1 diabetes mellitus (HCC) 01/23/2024   Neuroforaminal stenosis of lumbar spine 12/17/2023   Encounter for long-term opiate analgesic use 11/24/2023   Iron  deficiency anemia 06/16/2023   Esophageal stricture 06/16/2023   Mobility impaired 05/12/2023   BMI 26.0-26.9,adult 03/16/2023   Chronic SI joint pain 07/20/2022   Spondylosis without myelopathy or radiculopathy, lumbar region 04/20/2022   Encounter for therapeutic drug monitoring 04/20/2022   Chronic respiratory failure with hypoxia (HCC) 02/27/2022   Persistent atrial fibrillation (HCC) 02/17/2022   Encounter for monitoring dofetilide  therapy 01/19/2022   Chronic pain syndrome 12/22/2021   Tobacco use 12/17/2021   Other intervertebral disc degeneration, lumbar region 12/11/2021   Nodule of left lung 08/18/2021   Demand ischemia (HCC) 08/17/2021   Persistent atrial fibrillation/flutter with rapid ventricular response (HCC) 07/16/2021   Chronic combined systolic and diastolic CHF (congestive heart failure) (HCC) 07/16/2021   Hyponatremia 07/16/2021   Hypercoagulable state due to persistent atrial fibrillation (HCC) 12/25/2020   Alcohol-induced chronic pancreatitis (HCC) 12/17/2020   Delirium tremens  (HCC) 12/17/2020   COPD with chronic bronchitis and emphysema (HCC)    Hearing loss 07/26/2019   Chronic back pain 05/02/2019   Mild renal insufficiency 08/25/2017   Prepyloric ulcer 12/12/2014   Essential hypertension 12/07/2014   Mitral valve prolapse 12/06/2014   Atrial fibrillation with RVR (HCC) 11/26/2014   GERD (gastroesophageal reflux disease) 02/06/2013   Positive colorectal cancer screening using Cologuard test 02/04/2013   Symptomatic anemia 02/21/2011   COPD with acute exacerbation (HCC) 02/20/2011   Atypical atrial flutter (HCC) 02/16/2011   Uncontrolled type 1 diabetes mellitus with hypoglycemia, with long-term current use of insulin  (HCC) 02/16/2011       Review of Systems  Constitutional:  Negative for chills and fever.  Respiratory:  Positive for cough and shortness of breath.        Objective:   Physical Exam Constitutional:      Appearance: Normal appearance.  Cardiovascular:     Rate and Rhythm: Normal rate and regular rhythm.  Pulmonary:     Breath sounds: Wheezing (isp and exp throughout) present.     Comments: Deep wet cough Skin:    General: Skin is warm.  Neurological:     General: No focal deficit present.     Mental Status: He is alert and oriented to person, place, and time.  Psychiatric:        Mood and Affect: Mood normal.        Behavior: Behavior normal.    BP (!) 154/81   Pulse 71   Temp (!) 96.7 F (35.9 C) (Temporal)   SpO2 97%         Assessment &  Plan:   Thomas JONETTA Dollene Mickey. in today with chief complaint of Popping in lungs   1. Chronic cough (Primary) 1. Take meds as prescribed 2. Use a cool mist humidifier especially during the winter months and when heat has been humid. 3. Use saline nose sprays frequently 4. Saline irrigations of the nose can be very helpful if done frequently.  * 4X daily for 1 week*  * Use of a nettie pot can be helpful with this. Follow directions with this* 5. Drink plenty of fluids 6.  Keep thermostat turn down low 7.For any cough or congestion- mucinex  8. For fever or aces or pains- take tylenol  or ibuprofen appropriate for age and weight.  * for fevers greater than 101 orally you may alternate ibuprofen and tylenol  every  3 hours.    Meds ordered this encounter  Medications   DISCONTD: cephALEXin  (KEFLEX ) 500 MG capsule    Sig: Take 1 capsule (500 mg total) by mouth 3 (three) times daily.    Dispense:  30 capsule    Refill:  0    Supervising Provider:   DETTINGER, JOSHUA A [[id number]]   amoxicillin -clavulanate (AUGMENTIN ) 875-125 MG tablet    Sig: Take 1 tablet by mouth 2 (two) times daily.    Dispense:  28 tablet    Refill:  0    Do not fill keflex     Supervising Provider:   MARYANNE CHEW A [[id number]]       The above assessment and management plan was discussed with the patient. The patient verbalized understanding of and has agreed to the management plan. Patient is aware to call the clinic if symptoms persist or worsen. Patient is aware when to return to the clinic for a follow-up visit. Patient educated on when it is appropriate to go to the emergency department.   [name] Gladis, FNP   "

## 2024-03-03 NOTE — Patient Instructions (Signed)

## 2024-03-06 ENCOUNTER — Other Ambulatory Visit (HOSPITAL_COMMUNITY): Payer: Self-pay

## 2024-03-07 ENCOUNTER — Other Ambulatory Visit (HOSPITAL_COMMUNITY): Payer: Self-pay

## 2024-03-17 ENCOUNTER — Ambulatory Visit: Attending: Cardiology | Admitting: Cardiology

## 2024-03-17 ENCOUNTER — Encounter: Payer: Self-pay | Admitting: Cardiology

## 2024-03-17 VITALS — BP 106/72 | HR 85 | Ht 68.0 in | Wt 173.7 lb

## 2024-03-17 DIAGNOSIS — I2510 Atherosclerotic heart disease of native coronary artery without angina pectoris: Secondary | ICD-10-CM | POA: Diagnosis not present

## 2024-03-17 DIAGNOSIS — D649 Anemia, unspecified: Secondary | ICD-10-CM | POA: Diagnosis not present

## 2024-03-17 DIAGNOSIS — I10 Essential (primary) hypertension: Secondary | ICD-10-CM | POA: Diagnosis not present

## 2024-03-17 DIAGNOSIS — I4819 Other persistent atrial fibrillation: Secondary | ICD-10-CM

## 2024-03-17 DIAGNOSIS — I5020 Unspecified systolic (congestive) heart failure: Secondary | ICD-10-CM

## 2024-03-17 NOTE — Progress Notes (Signed)
 " Cardiology Office Note:    Date:  03/17/2024   ID:  Thomas JONETTA Dollene Mickey., DOB Aug 26, 1955, MRN [id number]  PCP:  Gladis Mustard, FNP  Cardiologist:  Lonni LITTIE Nanas, MD  Electrophysiologist:  OLE ONEIDA HOLTS, MD (Inactive)   Referring MD: Gladis Mustard, FNP   Chief Complaint  Patient presents with   Atrial Fibrillation    History of Present Illness:    Thomas Jeziorski. is a 69 y.o. male with a hx of alcohol abuse, tobacco abuse, COPD, perforated gastric ulcer status postrepair in 2016, T2DM, hypertension, PAD, MVP, atrial fibrillation/flutter, systolic heart failure who presents for follow-up.  He was hospitalized September 2022 with acute pancreatitis found to be in A. fib.  Echo at that time showed EF 30 to 35%.  He left AMA.  At follow-up with his PCP 12/17/2020 he was still in A. fib and started on Xarelto  and diltiazem .  He was referred to A. fib clinic, seen on 12/25/2020.  He underwent cardioversion on 01/20/2021 with successful restoration of sinus rhythm.  Echocardiogram 08/26/2017 showed EF 55 to 60%, mild mitral valve prolapse.  Echo 12/05/2020 showed EF 30 to 35%, global hypokinesis, and low normal RV function, moderate left atrial enlargement, trivial MR.  Zio patch x3 days on 02/10/2021 showed high percent A. fib burden with average rate 95 bpm.  Echocardiogram 05/21/2021 showed EF 55 to 60%, grade 2 diastolic dysfunction, normal RV function, no significant valvular disease.  He was admitted 11/2021 after being found unresponsive with significant hypoglycemia and hypotension.  EKG with aVR elevation and diffuse ST depressions, suggesting global ischemia.  EKG normalized with correction of his hypoglycemia.  He was seen by cardiology and outpatient coronary CT was recommended.  Coronary CTA was attempted but he was in A-fib on presentation and CT was canceled.  He was admitted for Tikosyn  loading 02/2022.  Converted to sinus rhythm on Tikosyn .   Underwent  successful cardioversion 07/12/2023 but had quick return of atrial flutter; EP recommended rate control only, Tikosyn  discontinued  Stress PET on 10/07/2022 showed small reversible perfusion defect in apical to mid inferior wall, mildly abnormal myocardial blood flow reserve (1.78).  Echo 01/24/2024 showed EF 60 to 65%, normal RV function, no significant valvular disease.  Since last clinic visit, he reports he is doing okay.  He is taking Lasix  40 mg Monday Wednesday Friday and other days takes 20 mg.  He was admitted and required IV diuresis 01/2024.  Weight has been stable since that time.  He reports dyspnea has improved.  He denies any chest pain, lightheadedness, or syncope.  Reports lower extremity IMA has improved.  He denies any palpitations.  He continues to smoke, under 1 pack/day.  He has not been drinking alcohol   Wt Readings from Last 3 Encounters:  03/17/24 173 lb 11.2 oz (78.8 kg)  02/08/24 174 lb (78.9 kg)  01/27/24 164 lb 1.6 oz (74.4 kg)    Past Medical History:  Diagnosis Date   Arthritis    Bladder cancer (HCC)    Chronic back pain    Chronic combined systolic and diastolic CHF (congestive heart failure) (HCC) 07/16/2021   COPD (chronic obstructive pulmonary disease) (HCC)    COPD with chronic bronchitis and emphysema (HCC)    DDD (degenerative disc disease)    Diabetic retinopathy of both eyes (HCC)    Essential hypertension    GERD (gastroesophageal reflux disease)    History of atrial flutter 02/07/2011   Converted to NSR  with Cardizem    History of chronic bronchitis    History of hemolytic anemia 02/07/2011   secondary to Avelox    HOH (hard of hearing)    HOH (hard of hearing)    no eardrum and nerve damage on R, also HOH on L   Mild renal insufficiency 08/25/2017   Mitral valve prolapse    a. 2D Echo 11/27/14: EF 55-60%; images were inadequate for LV wall motion assessment, + mild late systolic mitral valve prolapse involving the anterior leaflet.   PAD  (peripheral artery disease) 04/09/2014   Dr Serene; bilateral SFA occlusion, R mid, L distal   PAF (paroxysmal atrial fibrillation) (HCC)    a. Dx 11/2014 during admission for perf ulcer.   Perforated ulcer (HCC)    a. 11/2014 s/p surgery.   Productive cough    Smokers' cough (HCC)    Type 1 diabetes mellitus (HCC) 03/10/1975    Past Surgical History:  Procedure Laterality Date   BIOPSY OF SKIN SUBCUTANEOUS TISSUE AND/OR MUCOUS MEMBRANE  06/16/2023   Procedure: BIOPSY, SKIN, SUBCUTANEOUS TISSUE, OR MUCOUS MEMBRANE;  Surgeon: Abran Norleen SAILOR, MD;  Location: [facility];  Service: Gastroenterology;;   CARDIOVERSION N/A 01/20/2021   Procedure: CARDIOVERSION;  Surgeon: Okey Vina GAILS, MD;  Location: [facility]COPY;  Service: Cardiovascular;  Laterality: N/A;   CARDIOVERSION N/A 07/12/2023   Procedure: CARDIOVERSION;  Surgeon: Mona Vinie BROCKS, MD;  Location: [facility];  Service: Cardiovascular;  Laterality: N/A;   COLONOSCOPY WITH PROPOFOL  N/A 06/16/2023   Procedure: COLONOSCOPY WITH PROPOFOL ;  Surgeon: Abran Norleen SAILOR, MD;  Location: [facility];  Service: Gastroenterology;  Laterality: N/A;   CYSTOSCOPY WITH URETEROSCOPY Right 08/14/2013   Procedure: CYSTOSCOPY WITH URETEROSCOPY BLADDER BIOPSY ;  Surgeon: Oneil BROCKS Rafter, MD;  Location: [facility];  Service: Urology;  Laterality: Right;   ESOPHAGOGASTRODUODENOSCOPY N/A 02/06/2013   Procedure: ESOPHAGOGASTRODUODENOSCOPY (EGD);  Surgeon: Princella CHRISTELLA Nida, MD;  Location: [facility] ENDOSCOPY;  Service: Endoscopy;  Laterality: N/A;   ESOPHAGOGASTRODUODENOSCOPY (EGD) WITH PROPOFOL  N/A 06/16/2023   Procedure: ESOPHAGOGASTRODUODENOSCOPY (EGD) WITH PROPOFOL ;  Surgeon: Abran Norleen SAILOR, MD;  Location: [facility];  Service: Gastroenterology;  Laterality: N/A;   GIVENS CAPSULE STUDY N/A 01/26/2024   Procedure: IMAGING PROCEDURE, GI TRACT, INTRALUMINAL, VIA CAPSULE;  Surgeon: San Sandor GAILS, DO;  Location: [facility];  Service: Gastroenterology;   Laterality: N/A;   LAPAROSCOPY N/A 11/25/2014   Procedure: LAPAROSCOPIC PRIMARY REPAIR OF PERFORATED PREPYLORIC ULCER WITH ARLYSS LISLE;  Surgeon: Camellia Blush, MD;  Location: [facility] OR;  Service: General;  Laterality: N/A;   TONSILLECTOMY  as child   TRANSTHORACIC ECHOCARDIOGRAM  02-17-2011   MODERATE LVH/  EF 65%   TRANSURETHRAL RESECTION OF BLADDER TUMOR WITH GYRUS (TURBT-GYRUS) N/A 06/12/2013   Procedure: TRANSURETHRAL RESECTION OF BLADDER TUMOR WITH GYRUS (TURBT-GYRUS);  Surgeon: Mark C Ottelin, MD;  Location: [facility];  Service: Urology;  Laterality: N/A;   TYMPANIC MEMBRANE REPAIR  as child    Current Medications: Current Meds  Medication Sig   ACCU-CHEK GUIDE TEST test strip 4 (four) times daily.   amoxicillin -clavulanate (AUGMENTIN ) 875-125 MG tablet Take 1 tablet by mouth 2 (two) times daily.   cefUROXime  (CEFTIN ) 250 MG tablet Take by mouth.   cetirizine (ZYRTEC) 10 MG tablet Take 10 mg by mouth daily.   Continuous Glucose Receiver (DEXCOM G7 RECEIVER) DEVI daily.   Continuous Glucose Sensor (DEXCOM G7 SENSOR) MISC Change sensor every 10 days   doxycycline  (VIBRA -TABS) 100 MG tablet Take by  mouth.   empagliflozin  (JARDIANCE ) 10 MG TABS tablet Take 1 tablet (10 mg total) by mouth daily.   Fluticasone -Umeclidin-Vilant (TRELEGY ELLIPTA ) 100-62.5-25 MCG/ACT AEPB Inhale 1 puff into the lungs daily.   furosemide  (LASIX ) 40 MG tablet Take 0.5-1 tablets (20-40 mg total) by mouth See admin instructions. 20 mg Sun Tues Thurs Sat, 40 mg Mon Wed Fri   guaiFENesin  (MUCINEX ) 600 MG 12 hr tablet Take 1 tablet (600 mg total) by mouth 2 (two) times daily.   HYDROcodone -acetaminophen  (NORCO) 10-325 MG tablet Take 0.5-1 tablets by mouth every 6 (six) hours as needed for severe pain (pain score 7-10) or moderate pain (pain score 4-6). Do not fill prior to 02/07/24   insulin  glargine-yfgn (SEMGLEE ) 100 UNIT/ML injection Inject 0.3 mLs (30 Units total) into the skin daily.    ipratropium-albuterol  (DUONEB) 0.5-2.5 (3) MG/3ML SOLN INHALE CONTENTS OF 1 VIAL VIA NEBULIZER EVERY 4 HOURS AS NEEDED   levalbuterol  (XOPENEX ) 0.31 MG/3ML nebulizer solution INHALE 3 MLS (0.31 MG TOTAL) BY NEBULIZATION EVERY 4 (FOUR) HOURS AS NEEDED FOR WHEEZING.   lidocaine  (LIDODERM ) 5 % Place 1 patch onto the skin daily. Remove & Discard patch within 12 hours or as directed by MD   losartan  (COZAAR ) 50 MG tablet TAKE 1 TABLET BY MOUTH EVERY DAY   metoprolol  succinate (TOPROL -XL) 100 MG 24 hr tablet Take 1 tablet (100 mg total) by mouth 2 (two) times daily. Take with or immediately following a meal.   NOVOLOG  100 UNIT/ML injection Inject into the skin.   OVER THE COUNTER MEDICATION Take 1-2 tablets by mouth See admin instructions. Super Beets gummies- Chew 1-2 gummies by mouth every day   OXYGEN  Inhale 3 L/min into the lungs continuous.   pantoprazole  (PROTONIX ) 40 MG tablet Take 1 tablet (40 mg total) by mouth 2 (two) times daily.   potassium chloride  (KLOR-CON ) 10 MEQ tablet Take 10 mEq by mouth daily.   XARELTO  20 MG TABS tablet TAKE 1 TABLET BY MOUTH DAILY  WITH SUPPER   [DISCONTINUED] insulin  lispro (HUMALOG ) 100 UNIT/ML injection PER SLIDING SCALE: 190 - 200 = 2 UNITS. 200-300= 4u 300 AND ABOVE = 7 UNITS.     Allergies:   Avelox  [moxifloxacin  hcl in nacl], Azithromycin , and Bactrim [sulfamethoxazole -trimethoprim]   Social History   Socioeconomic History   Marital status: Married    Spouse name: Not on file   Number of children: Not on file   Years of education: Not on file   Highest education level: Not on file  Occupational History   Occupation: Tobacco Farmer  Tobacco Use   Smoking status: Every Day    Current packs/day: 0.50    Average packs/day: 0.5 packs/day for 30.0 years (15.0 ttl pk-yrs)    Types: Cigarettes   Smokeless tobacco: Former    Quit date: 06/08/1978   Tobacco comments:    Patient smokes a less than pack daily 1/2 pk 01/05/2024 NM, CMA /  Vaping Use    Vaping status: Never Used  Substance and Sexual Activity   Alcohol use: Not Currently    Comment: 5 quarts per week 12/26/21   Drug use: No   Sexual activity: Not on file  Other Topics Concern   Not on file  Social History Narrative   Lives in [place] with wife and 2 sons.    Social Drivers of Health   Tobacco Use: High Risk (03/17/2024)   Patient History    Smoking Tobacco Use: Every Day    Smokeless Tobacco Use: Former  Passive Exposure: Not on file  Financial Resource Strain: Low Risk (04/12/2023)   Overall Financial Resource Strain (CARDIA)    Difficulty of Paying Living Expenses: Not very hard  Food Insecurity: No Food Insecurity (01/28/2024)   Epic    Worried About Programme Researcher, Broadcasting/film/video in the Last Year: Never true    Ran Out of Food in the Last Year: Never true  Transportation Needs: No Transportation Needs (01/28/2024)   Epic    Lack of Transportation (Medical): No    Lack of Transportation (Non-Medical): No  Physical Activity: Inactive (04/12/2023)   Exercise Vital Sign    Days of Exercise per Week: 0 days    Minutes of Exercise per Session: 0 min  Stress: No Stress Concern Present (04/12/2023)   [employer] of Occupational Health - Occupational Stress Questionnaire    Feeling of Stress : Not at all  Social Connections: Moderately Isolated (01/24/2024)   Social Connection and Isolation Panel    Frequency of Communication with Friends and Family: More than three times a week    Frequency of Social Gatherings with Friends and Family: More than three times a week    Attends Religious Services: Never    Database Administrator or Organizations: No    Attends Banker Meetings: Never    Marital Status: Married  Depression (PHQ2-9): Low Risk (02/23/2024)   Depression (PHQ2-9)    PHQ-2 Score: 0  Alcohol Screen: Low Risk (04/12/2023)   Alcohol Screen    Last Alcohol Screening Score (AUDIT): 0  Housing: Unknown (01/28/2024)   Epic    Unable to Pay for  Housing in the Last Year: No    Number of Times Moved in the Last Year: Not on file    Homeless in the Last Year: No  Utilities: Not At Risk (01/28/2024)   Epic    Threatened with loss of utilities: No  Health Literacy: Adequate Health Literacy (04/12/2023)   B1300 Health Literacy    Frequency of need for help with medical instructions: Rarely     Family History: The patient's family history includes Breast cancer in his mother; Cancer in his mother; Diabetes in his son; Heart attack in his father; Heart disease in his father; Rheumatic fever in his father.  ROS:   Please see the history of present illness.     All other systems reviewed and are negative.  EKGs/Labs/Other Studies Reviewed:    The following studies were reviewed today:   EKG:   03/17/2024: Atrial flutter, rate 86, QTc 488 11/12/2023: Atrial fibrillation, rate 76, QTc 441 01/19/2023: Atrial flutter, rate 97, QTc 441 09/29/22: AFL, rate 87, Qtc 459 06/24/22: Normal sinus rhythm, rate 60, QTc 452 02/04/22: Atrial fibrillation, rate 104 11/19/2021: Normal sinus rhythm, rate 72, left axis deviation 08/20/2021: Atrial flutter with variable conduction, rate 87 05/20/21: Normal sinus rhythm, rate 61, left axis deviation, nonspecific T wave flattening, Q waves in leads III, aVF 03/14/21:NSR, ST depression<1 mm in inferior leads and V4-6, rate 61  Recent Labs: 01/23/2024: B Natriuretic Peptide 730.4 01/27/2024: Magnesium  1.9 02/08/2024: ALT 11; BUN 13; Creatinine, Ser 1.07; Potassium 4.4; Sodium 137 02/21/2024: Hemoglobin 10.4; Platelets 343  Recent Lipid Panel    Component Value Date/Time   CHOL 125 10/08/2023 1428   TRIG 72 10/08/2023 1428   HDL 46 10/08/2023 1428   CHOLHDL 2.7 10/08/2023 1428   LDLCALC 64 10/08/2023 1428    Physical Exam:    VS:  BP 106/72 (BP Location: Left  Arm, Patient Position: Sitting, Cuff Size: Large)   Pulse 85   Ht 5' 8 (1.727 m)   Wt 173 lb 11.2 oz (78.8 kg)   SpO2 100%   BMI 26.41 kg/m      Wt Readings from Last 3 Encounters:  03/17/24 173 lb 11.2 oz (78.8 kg)  02/08/24 174 lb (78.9 kg)  01/27/24 164 lb 1.6 oz (74.4 kg)     GEN:  in no acute distress HEENT: Normal NECK: No JVD; No carotid bruits CARDIAC: RRR, no murmurs, rubs, gallops RESPIRATORY:  Expiratory wheezing ABDOMEN: Soft, non-tender, non-distended MUSCULOSKELETAL: no edema; No deformity  SKIN: Warm and dry NEUROLOGIC:  Alert and oriented x 3 PSYCHIATRIC:  Normal affect   ASSESSMENT:    1. Persistent atrial fibrillation (HCC)   2. Heart failure with recovered ejection fraction (HFrecEF) (HCC)   3. Coronary artery disease involving native coronary artery of native heart without angina pectoris   4. Essential hypertension   5. Anemia, unspecified type        PLAN:    Persistent atrial fibrillation/flutter: CHA2DS2-VASc score 4.  Underwent successful cardioversion on 01/20/2021, but was back in A. fib at follow-up appointment on 01/24/2021.  Zio patch x3 days on 02/10/2021 showed 100% percent A. fib burden with average rate 95 bpm.   -Continue Xarelto  20 mg daily -Continue Toprol -XL 100 mg twice daily -Given possible tachycardia induced cardiomyopathy, referred to EP to consider ablation versus antiarrhythmic.  Seen by Dr Cindie.  Admitted 02/2022 for Tikosyn  loading.  Underwent successful cardioversion 07/12/2023 but had quick return of atrial flutter; EP recommended rate control only, Tikosyn  discontinued  Heart failure with recovered EF: EF 30 to 35% on echocardiogram during admission with acute pancreatitis in September 2022.  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  Echocardiogram 05/21/2021 showed EF 55 to 60%.  Echo 01/24/2024 showed EF 60 to 65%, normal RV function, no significant valvular disease. -Continue Lasix  20 mg daily with 40 mg on MWF.  Check CMET, magnesium  -Continue Toprol -XL 100 mg BID -Continue losartan  50 mg daily -Continue Jardiance  10 mg daily  CAD: He was admitted 11/2021  after being found unresponsive with significant hypoglycemia and hypotension.  EKG with aVR elevation and diffuse ST depressions, suggesting global ischemia.  EKG normalized with correction of his hypoglycemia.  He was seen by cardiology and outpatient coronary CT was recommended.  He denies any chest pain but having dyspnea on exertion -Recommend coronary CTA to evaluate for obstructive CAD.  Coronary CTA was attempted but he was in A-fib on presentation and was canceled.  Stress PET on 10/07/2022 showed small reversible perfusion defect in apical to mid inferior wall, mildly abnormal myocardial blood flow reserve (1.78), EF reported to drop from 81% at rest to 30% at stress (processing images I got 60% at rest and improved to 64% at stress), overall low risk study -LDL 64 on 10/08/23  Alcohol abuse: He reports he stopped drinking after starting Tikosyn .  Since he discontinued Tikosyn  he has started having 2-3 drinks per week, though at clinic today reports has not been drinking  Tobacco use: Patient counseled on the risk of tobacco use and cessation strongly encouraged.  Hypertension: Continue Toprol -XL, losartan .  Appears controlled  T2DM: On insulin .  A1c 5.8% on 10/2023  PAD: ABIs 2019 are severely reduced (right 0.52, left 0.48).  Lower extremity duplex 04/2014 showed bilateral SFA occlusions.  ABIs 02/2022 were 0.36 on right, 0.45 on left.  Follows with vascular surgery  Anemia: Check CBC  RTC in 3 months  Medication Adjustments/Labs and Tests Ordered: Current medicines are reviewed at length with the patient today.  Concerns regarding medicines are outlined above.  Orders Placed This Encounter  Procedures   Comprehensive Metabolic Panel (CMET)   Magnesium    CBC   EKG 12-Lead    No orders of the defined types were placed in this encounter.   Patient Instructions  Medication Instructions:  Your physician recommends that you continue on your current medications as directed. Please  refer to the Current Medication list given to you today.  *If you need a refill on your cardiac medications before your next appointment, please call your pharmacy*  Lab Work: Cmet, cbc, mg If you have labs (blood work) drawn today and your tests are completely normal, you will receive your results only by: MyChart Message (if you have MyChart) OR A paper copy in the mail If you have any lab test that is abnormal or we need to change your treatment, we will call you to review the results.  Testing/Procedures: none  Follow-Up: At [facility], you and your health needs are our priority.  As part of our continuing mission to provide you with exceptional heart care, our providers are all part of one team.  This team includes your primary Cardiologist (physician) and Advanced Practice Providers or APPs (Physician Assistants and Nurse Practitioners) who all work together to provide you with the care you need, when you need it.  Your next appointment:   3 months  Provider:   Dr. Kate   We recommend signing up for the patient portal called MyChart.  Sign up information is provided on this After Visit Summary.  MyChart is used to connect with patients for Virtual Visits (Telemedicine).  Patients are able to view lab/test results, encounter notes, upcoming appointments, etc.  Non-urgent messages can be sent to your provider as well.   To learn more about what you can do with MyChart, go to [email or web address].   Other Instructions none           Signed, Lonni LITTIE Kate, MD  03/17/2024 5:21 PM    Thomas Sandoval "

## 2024-03-17 NOTE — Patient Instructions (Signed)
 Medication Instructions:  Your physician recommends that you continue on your current medications as directed. Please refer to the Current Medication list given to you today.  *If you need a refill on your cardiac medications before your next appointment, please call your pharmacy*  Lab Work: Cmet, cbc, mg If you have labs (blood work) drawn today and your tests are completely normal, you will receive your results only by: MyChart Message (if you have MyChart) OR A paper copy in the mail If you have any lab test that is abnormal or we need to change your treatment, we will call you to review the results.  Testing/Procedures: none  Follow-Up: At [facility], you and your health needs are our priority.  As part of our continuing mission to provide you with exceptional heart care, our providers are all part of one team.  This team includes your primary Cardiologist (physician) and Advanced Practice Providers or APPs (Physician Assistants and Nurse Practitioners) who all work together to provide you with the care you need, when you need it.  Your next appointment:   3 months  Provider:   Dr. Kate   We recommend signing up for the patient portal called MyChart.  Sign up information is provided on this After Visit Summary.  MyChart is used to connect with patients for Virtual Visits (Telemedicine).  Patients are able to view lab/test results, encounter notes, upcoming appointments, etc.  Non-urgent messages can be sent to your provider as well.   To learn more about what you can do with MyChart, go to [email or web address].   Other Instructions none

## 2024-03-20 ENCOUNTER — Telehealth: Payer: Self-pay

## 2024-03-20 NOTE — Telephone Encounter (Unsigned)
 Copied from CRM #[id number]. Topic: Clinical - Request for Lab/Test Order >> Mar 20, 2024 12:17 PM Leila C wrote: Reason for CRM: Patient's spouse [name] [phone] states patient is scheduled for CT scan 04/11/24 and patient cannot have the sensor during the CT scan. Patient has a sensor on left arm (diabetes) last for 10 days and insurance only provide 3 sensors in a month. [facility] advised patient to contact the office, because of how Dr. Shelah placed the order they cannot reschedule it to a later date. [name] states patient can do afternoon appointments, 04/05/24, 04/25/24 or 05/05/24 are the days patient's sensor will run out. Unable to reach CAL. Please advise and call back.

## 2024-03-24 ENCOUNTER — Other Ambulatory Visit: Payer: Self-pay | Admitting: Nurse Practitioner

## 2024-03-24 ENCOUNTER — Other Ambulatory Visit

## 2024-03-24 DIAGNOSIS — E10649 Type 1 diabetes mellitus with hypoglycemia without coma: Secondary | ICD-10-CM

## 2024-03-25 LAB — CBC
Hematocrit: 39 % (ref 37.5–51.0)
Hemoglobin: 12.2 g/dL — ABNORMAL LOW (ref 13.0–17.7)
MCH: 29.5 pg (ref 26.6–33.0)
MCHC: 31.3 g/dL — ABNORMAL LOW (ref 31.5–35.7)
MCV: 94 fL (ref 79–97)
Platelets: 251 x10E3/uL (ref 150–450)
RBC: 4.14 x10E6/uL (ref 4.14–5.80)
RDW: 17.3 % — ABNORMAL HIGH (ref 11.6–15.4)
WBC: 6.9 x10E3/uL (ref 3.4–10.8)

## 2024-03-25 LAB — COMPREHENSIVE METABOLIC PANEL WITH GFR
ALT: 10 IU/L (ref 0–44)
AST: 16 IU/L (ref 0–40)
Albumin: 4 g/dL (ref 3.9–4.9)
Alkaline Phosphatase: 83 IU/L (ref 47–123)
BUN/Creatinine Ratio: 11 (ref 10–24)
BUN: 10 mg/dL (ref 8–27)
Bilirubin Total: 0.5 mg/dL (ref 0.0–1.2)
CO2: 28 mmol/L (ref 20–29)
Calcium: 9.6 mg/dL (ref 8.6–10.2)
Chloride: 95 mmol/L — ABNORMAL LOW (ref 96–106)
Creatinine, Ser: 0.91 mg/dL (ref 0.76–1.27)
Globulin, Total: 2.6 g/dL (ref 1.5–4.5)
Glucose: 85 mg/dL (ref 70–99)
Potassium: 3.8 mmol/L (ref 3.5–5.2)
Sodium: 139 mmol/L (ref 134–144)
Total Protein: 6.6 g/dL (ref 6.0–8.5)
eGFR: 93 mL/min/1.73

## 2024-03-25 LAB — MAGNESIUM: Magnesium: 1.9 mg/dL (ref 1.6–2.3)

## 2024-03-27 ENCOUNTER — Ambulatory Visit: Payer: Self-pay | Admitting: Cardiology

## 2024-03-31 ENCOUNTER — Telehealth: Payer: Self-pay

## 2024-03-31 MED ORDER — CEFUROXIME AXETIL 250 MG PO TABS: ORAL_TABLET | ORAL | 0 refills | Status: DC

## 2024-03-31 NOTE — Telephone Encounter (Signed)
 Copied from CRM #[id number]. Topic: Clinical - Medication Refill >> Mar 31, 2024 10:16 AM Ismael A wrote: Medication: cefUROXime  (CEFTIN ) 250 MG tablet doxycycline  (VIBRA -TABS) 100 MG tablet  Has the patient contacted their pharmacy? No (Agent: If no, request that the patient contact the pharmacy for the refill. If patient does not wish to contact the pharmacy document the reason why and proceed with request.) (Agent: If yes, when and what did the pharmacy advise?)  This is the patient's preferred pharmacy:  [id number] - [place], [place] - [address] [place] [address] Phone: [phone] Fax: [phone]   Is this the correct pharmacy for this prescription? Yes If no, delete pharmacy and type the correct one.   Has the prescription been filled recently? No  Is the patient out of the medication? No  Has the patient been seen for an appointment in the last year OR does the patient have an upcoming appointment? Yes  Can we respond through MyChart? Yes  Agent: Please be advised that Rx refills may take up to 3 business days. We ask that you follow-up with your pharmacy.     Per lov Continue rotating your doxycycline  and your cefuroxime  for 1 week every other month Rx refill sent.

## 2024-04-05 ENCOUNTER — Other Ambulatory Visit: Payer: Self-pay | Admitting: Nurse Practitioner

## 2024-04-05 ENCOUNTER — Ambulatory Visit
Admission: RE | Admit: 2024-04-05 | Discharge: 2024-04-05 | Disposition: A | Source: Ambulatory Visit | Attending: Emergency Medicine | Admitting: Emergency Medicine

## 2024-04-05 DIAGNOSIS — R911 Solitary pulmonary nodule: Secondary | ICD-10-CM

## 2024-04-05 DIAGNOSIS — E10649 Type 1 diabetes mellitus with hypoglycemia without coma: Secondary | ICD-10-CM

## 2024-04-06 ENCOUNTER — Other Ambulatory Visit (HOSPITAL_COMMUNITY): Payer: Self-pay

## 2024-04-07 ENCOUNTER — Other Ambulatory Visit (HOSPITAL_COMMUNITY): Payer: Self-pay

## 2024-04-10 ENCOUNTER — Ambulatory Visit: Payer: Self-pay | Admitting: Nurse Practitioner

## 2024-04-11 ENCOUNTER — Other Ambulatory Visit

## 2024-04-11 ENCOUNTER — Ambulatory Visit (HOSPITAL_COMMUNITY)

## 2024-04-12 ENCOUNTER — Ambulatory Visit: Payer: Self-pay | Admitting: Emergency Medicine

## 2024-04-13 ENCOUNTER — Encounter: Payer: Self-pay | Admitting: Emergency Medicine

## 2024-04-13 ENCOUNTER — Ambulatory Visit: Admitting: Emergency Medicine

## 2024-04-13 VITALS — BP 117/69 | HR 96 | Ht 68.0 in | Wt 173.0 lb

## 2024-04-13 DIAGNOSIS — R918 Other nonspecific abnormal finding of lung field: Secondary | ICD-10-CM

## 2024-04-13 DIAGNOSIS — Z720 Tobacco use: Secondary | ICD-10-CM

## 2024-04-13 DIAGNOSIS — F17210 Nicotine dependence, cigarettes, uncomplicated: Secondary | ICD-10-CM

## 2024-04-13 DIAGNOSIS — J9611 Chronic respiratory failure with hypoxia: Secondary | ICD-10-CM

## 2024-04-13 DIAGNOSIS — J439 Emphysema, unspecified: Secondary | ICD-10-CM

## 2024-04-13 DIAGNOSIS — R911 Solitary pulmonary nodule: Secondary | ICD-10-CM

## 2024-04-13 DIAGNOSIS — J4489 Other specified chronic obstructive pulmonary disease: Secondary | ICD-10-CM

## 2024-04-13 MED ORDER — CEFUROXIME AXETIL 250 MG PO TABS: 250.0000 mg | ORAL_TABLET | Freq: Two times a day (BID) | ORAL | 6 refills | Status: AC

## 2024-04-13 MED ORDER — DOXYCYCLINE HYCLATE 100 MG PO TABS: 100.0000 mg | ORAL_TABLET | Freq: Two times a day (BID) | ORAL | 6 refills | Status: AC

## 2024-04-13 NOTE — Progress Notes (Signed)
 "  Subjective:    Patient ID: Thomas JONETTA Dollene Mickey., male    DOB: March 10, 1955, 69 y.o.   MRN: [id number]  HPI  ROV 01/05/2024 --Tavarion is 69 with a history of heavy tobacco use and associated severe COPD, chronic bronchitis.  His imaging shows bronchiectasis and scattered pulmonary nodular disease, stable on serial films.  Most recent was August 2025 as below.  He has diabetes, atrial fibrillation on anticoagulation, hypertension with diastolic dysfunction.  He was seen in our office 1 month ago with an acute exacerbation of his COPD, cough with some associated scant hemoptysis.  He was treated with Augmentin  and prednisone   > better.  He reports today that he has persistent congestion, productive of brownish mucous. He rotates doxycycline  and cefuroxime .  He is being managed on Trelegy, DuoNeb qid, xopenex .  He is smoking just under a pack a day.  He does not want the flu shot   CT chest 10/19/2023 reviewed by me, shows no mediastinal or hilar adenopathy, unchanged 1.2 x 1.2 cm posterior medial left upper lobe pulmonary nodule with calcification.  There is a calcified posterior right upper lobe nodule, unchanged biapical pleural-parenchymal scarring  ROV 04/13/2024 --follow-up visit for 69 year old man with a history of heavy tobacco use and severe COPD with chronic bronchitic symptoms and chronic bronchiectasis.  He also has scattered pulmonary nodular disease that we have followed with serial imaging and has been stable.  PMH also significant for A-fib, diabetes, hypertension with diastolic dysfunction. He is on alternating abx - doxy and ceftin .    CT chest on 04/05/2024 reviewed by me, shows no mediastinal or hilar adenopathy.  There is a spiculated left upper lobe pulmonary nodule 12 x 13 x 17 mm that is unchanged compared with his priors (going back to 01/2022).  He has bilateral bronchiectasis and scattered bronchial wall thickening.   Review of Systems As per HPI  Past Medical History:   Diagnosis Date   Arthritis    Bladder cancer (HCC)    Chronic back pain    Chronic combined systolic and diastolic CHF (congestive heart failure) (HCC) 07/16/2021   COPD (chronic obstructive pulmonary disease) (HCC)    COPD with chronic bronchitis and emphysema (HCC)    DDD (degenerative disc disease)    Diabetic retinopathy of both eyes (HCC)    Essential hypertension    GERD (gastroesophageal reflux disease)    History of atrial flutter 02/07/2011   Converted to NSR with Cardizem    History of chronic bronchitis    History of hemolytic anemia 02/07/2011   secondary to Avelox    HOH (hard of hearing)    HOH (hard of hearing)    no eardrum and nerve damage on R, also HOH on L   Mild renal insufficiency 08/25/2017   Mitral valve prolapse    a. 2D Echo 11/27/14: EF 55-60%; images were inadequate for LV wall motion assessment, + mild late systolic mitral valve prolapse involving the anterior leaflet.   PAD (peripheral artery disease) 04/09/2014   Dr Serene; bilateral SFA occlusion, R mid, L distal   PAF (paroxysmal atrial fibrillation) (HCC)    a. Dx 11/2014 during admission for perf ulcer.   Perforated ulcer (HCC)    a. 11/2014 s/p surgery.   Productive cough    Smokers' cough (HCC)    Type 1 diabetes mellitus (HCC) 03/10/1975     Family History  Problem Relation Age of Onset   Breast cancer Mother    Cancer Mother  Breast   Rheumatic fever Father    Heart disease Father    Heart attack Father        Massive    Diabetes Son      Social History   Socioeconomic History   Marital status: Married    Spouse name: Not on file   Number of children: Not on file   Years of education: Not on file   Highest education level: Not on file  Occupational History   Occupation: Tobacco Farmer  Tobacco Use   Smoking status: Every Day    Current packs/day: 0.50    Average packs/day: 0.5 packs/day for 30.0 years (15.0 ttl pk-yrs)    Types: Cigarettes   Smokeless tobacco:  Former    Quit date: 06/08/1978   Tobacco comments:    Patient smokes a less than pack daily 1/2 pk 01/05/2024 NM, CMA /  Vaping Use   Vaping status: Never Used  Substance and Sexual Activity   Alcohol use: Not Currently    Comment: 5 quarts per week 12/26/21   Drug use: No   Sexual activity: Not on file  Other Topics Concern   Not on file  Social History Narrative   Lives in [place] with wife and 2 sons.    Social Drivers of Health   Tobacco Use: High Risk (04/13/2024)   Patient History    Smoking Tobacco Use: Every Day    Smokeless Tobacco Use: Former    Passive Exposure: Not on Actuary Strain: Low Risk (04/12/2023)   Overall Financial Resource Strain (CARDIA)    Difficulty of Paying Living Expenses: Not very hard  Food Insecurity: No Food Insecurity (01/28/2024)   Epic    Worried About Programme Researcher, Broadcasting/film/video in the Last Year: Never true    Ran Out of Food in the Last Year: Never true  Transportation Needs: No Transportation Needs (01/28/2024)   Epic    Lack of Transportation (Medical): No    Lack of Transportation (Non-Medical): No  Physical Activity: Inactive (04/12/2023)   Exercise Vital Sign    Days of Exercise per Week: 0 days    Minutes of Exercise per Session: 0 min  Stress: No Stress Concern Present (04/12/2023)   [employer] of Occupational Health - Occupational Stress Questionnaire    Feeling of Stress : Not at all  Social Connections: Moderately Isolated (01/24/2024)   Social Connection and Isolation Panel    Frequency of Communication with Friends and Family: More than three times a week    Frequency of Social Gatherings with Friends and Family: More than three times a week    Attends Religious Services: Never    Database Administrator or Organizations: No    Attends Banker Meetings: Never    Marital Status: Married  Catering Manager Violence: Not At Risk (01/24/2024)   Epic    Fear of Current or Ex-Partner: No    Emotionally  Abused: No    Physically Abused: No    Sexually Abused: No  Depression (PHQ2-9): Low Risk (02/23/2024)   Depression (PHQ2-9)    PHQ-2 Score: 0  Alcohol Screen: Low Risk (04/12/2023)   Alcohol Screen    Last Alcohol Screening Score (AUDIT): 0  Housing: Unknown (01/28/2024)   Epic    Unable to Pay for Housing in the Last Year: No    Number of Times Moved in the Last Year: Not on file    Homeless in the Last Year: No  Utilities: Not At Risk (01/28/2024)   Epic    Threatened with loss of utilities: No  Health Literacy: Adequate Health Literacy (04/12/2023)   B1300 Health Literacy    Frequency of need for help with medical instructions: Rarely     Allergies  Allergen Reactions   Avelox  [Moxifloxacin  Hcl In Nacl] Other (See Comments)    Hemolysis  In 2012   Azithromycin  Other (See Comments) and Nausea And Vomiting    Severe stomach cramps; told to list as an allergy by dr. Orlinda ago   Bactrim [Sulfamethoxazole -Trimethoprim] Diarrhea and Nausea And Vomiting     Outpatient Medications Prior to Visit  Medication Sig Dispense Refill   ACCU-CHEK GUIDE TEST test strip 4 (four) times daily.     cetirizine (ZYRTEC) 10 MG tablet Take 10 mg by mouth daily.     Continuous Glucose Receiver (DEXCOM G7 RECEIVER) DEVI daily.     Continuous Glucose Sensor (DEXCOM G7 SENSOR) MISC Change sensor every 10 days 3 each 5   empagliflozin  (JARDIANCE ) 10 MG TABS tablet Take 1 tablet (10 mg total) by mouth daily. 90 tablet 0   Fluticasone -Umeclidin-Vilant (TRELEGY ELLIPTA ) 100-62.5-25 MCG/ACT AEPB Inhale 1 puff into the lungs daily. 1 each 11   furosemide  (LASIX ) 40 MG tablet Take 0.5-1 tablets (20-40 mg total) by mouth See admin instructions. 20 mg Sun Tues Thurs Sat, 40 mg Mon Wed Fri 135 tablet 1   guaiFENesin  (MUCINEX ) 600 MG 12 hr tablet Take 1 tablet (600 mg total) by mouth 2 (two) times daily. 20 tablet 0   HYDROcodone -acetaminophen  (NORCO) 10-325 MG tablet Take 0.5-1 tablets by mouth every 6 (six)  hours as needed for severe pain (pain score 7-10) or moderate pain (pain score 4-6). Do not fill prior to 02/07/24 120 tablet 0   [START ON 05/07/2024] HYDROcodone -acetaminophen  (NORCO) 10-325 MG tablet Take 0.5-1 tablets by mouth every 6 (six) hours as needed for severe pain (pain score 7-10) or moderate pain (pain score 4-6). Do not fill prior to 02/07/24 120 tablet 0   insulin  aspart (NOVOLOG ) 100 UNIT/ML injection PER SLIDING SCALE: 190 - 200 = 2 UNITS. 300 AND ABOVE = 7 UNITS. 10 mL 1   insulin  glargine-yfgn (SEMGLEE ) 100 UNIT/ML injection Inject 0.3 mLs (30 Units total) into the skin daily.     ipratropium-albuterol  (DUONEB) 0.5-2.5 (3) MG/3ML SOLN INHALE CONTENTS OF 1 VIAL VIA NEBULIZER EVERY 4 HOURS AS NEEDED 360 mL 5   levalbuterol  (XOPENEX ) 0.31 MG/3ML nebulizer solution INHALE 3 MLS (0.31 MG TOTAL) BY NEBULIZATION EVERY 4 (FOUR) HOURS AS NEEDED FOR WHEEZING. 1620 mL 1   lidocaine  (LIDODERM ) 5 % Place 1 patch onto the skin daily. Remove & Discard patch within 12 hours or as directed by MD 30 patch 6   losartan  (COZAAR ) 50 MG tablet TAKE 1 TABLET BY MOUTH EVERY DAY 90 tablet 0   metoprolol  succinate (TOPROL -XL) 100 MG 24 hr tablet Take 1 tablet (100 mg total) by mouth 2 (two) times daily. Take with or immediately following a meal. 180 tablet 2   naloxone  (NARCAN ) nasal spray 4 mg/0.1 mL Use as needed on packaging in event of opiate overdose 1 each 0   OVER THE COUNTER MEDICATION Take 1-2 tablets by mouth See admin instructions. Super Beets gummies- Chew 1-2 gummies by mouth every day     OXYGEN  Inhale 3 L/min into the lungs continuous.     pantoprazole  (PROTONIX ) 40 MG tablet Take 1 tablet (40 mg total) by mouth 2 (two) times daily.  720 tablet 0   potassium chloride  (KLOR-CON ) 10 MEQ tablet Take 10 mEq by mouth daily.     XARELTO  20 MG TABS tablet TAKE 1 TABLET BY MOUTH DAILY  WITH SUPPER 100 tablet 2   amoxicillin -clavulanate (AUGMENTIN ) 875-125 MG tablet Take 1 tablet by mouth 2 (two) times  daily. 28 tablet 0   cefUROXime  (CEFTIN ) 250 MG tablet Rotating abx,take for 1wk bimonthly. 28 tablet 0   doxycycline  (VIBRA -TABS) 100 MG tablet Take by mouth.     No facility-administered medications prior to visit.         Objective:   Physical Exam  Vitals:   04/13/24 1502  BP: 117/69  Pulse: 96  SpO2: 100%  Weight: 173 lb (78.5 kg)  Height: 5' 8 (1.727 m)     Gen: Pleasant, well-nourished, in no distress,  normal affect, frequent cough  ENT: No lesions,  mouth clear,  oropharynx clear, no postnasal drip, very poor hearing   Neck: No JVD, no stridor  Lungs: No use of accessory muscles, good air movement.  Soft expiratory wheeze at the left base, otherwise clear.  Improved from his prior exam  Cardiovascular: RRR, heart sounds normal, no murmur or gallops, no peripheral edema  Musculoskeletal: No deformities, no cyanosis or clubbing  Neuro: alert, awake, non focal  Skin: Warm, no lesions or rash     Assessment & Plan:  Nodule of left lung CT chest reviewed.  Left upper lobe nodule is stable, suspect this is an area of scarring postinfection.  Plan to continue appropriate surveillance especially since he continues to smoke.  Next scan to be done at the 1 year mark  COPD with chronic bronchitis and emphysema (HCC) Please continue your Trelegy once daily as you have been taking.  Rinse and gargle after using. Continue use your DuoNebs 4 times a day and as needed for shortness of breath, chest tightness, wheezing. Continue alternating antibiotics for 1 week every other month.  We refilled your cefuroxime  and doxycycline  today.  Go ahead and start the doxycycline  now and continue your alternating plan. Follow in our office in about 6 months. Follow Dr. Shelah in 1 year after your CT chest so we can review.  Chronic respiratory failure with hypoxia (HCC) Continue your oxygen  as you have been using it.  Our goal is to keep your SpO2 > 90%  Tobacco use Work on  decreasing your cigarettes if able   I personally spent a total of 21 minutes in the care of the patient today including preparing to see the patient, getting/reviewing separately obtained history, performing a medically appropriate exam/evaluation, counseling and educating, placing orders, documenting clinical information in the EHR, independently interpreting results, and communicating results.    Lamar Shelah, MD, PhD 04/13/2024, 5:03 PM [facility] [phone] or if no answer before 7:00PM call [phone] For any issues after 7:00PM please call eLink [phone]   "

## 2024-04-13 NOTE — Patient Instructions (Signed)
 Please continue your Trelegy once daily as you have been taking.  Rinse and gargle after using. Continue use your DuoNebs 4 times a day and as needed for shortness of breath, chest tightness, wheezing. Continue alternating antibiotics for 1 week every other month.  We refilled your cefuroxime  and doxycycline  today.  Go ahead and start the doxycycline  now and continue your alternating plan. Work on decreasing your cigarettes if able Continue your oxygen  as you have been using it.  Our goal is to keep your SpO2 > 90% We reviewed your CT scan of the chest today.  We will plan to repeat your CT scan of the chest in January 2027. Follow in our office in about 6 months. Follow Dr. Shelah in 1 year after your CT chest so we can review.

## 2024-04-13 NOTE — Assessment & Plan Note (Signed)
 Please continue your Trelegy once daily as you have been taking.  Rinse and gargle after using. Continue use your DuoNebs 4 times a day and as needed for shortness of breath, chest tightness, wheezing. Continue alternating antibiotics for 1 week every other month.  We refilled your cefuroxime  and doxycycline  today.  Go ahead and start the doxycycline  now and continue your alternating plan. Follow in our office in about 6 months. Follow Dr. Shelah in 1 year after your CT chest so we can review.

## 2024-04-13 NOTE — Assessment & Plan Note (Signed)
 Continue your oxygen  as you have been using it.  Our goal is to keep your SpO2 > 90%

## 2024-04-13 NOTE — Assessment & Plan Note (Signed)
 Work on decreasing your cigarettes if able

## 2024-04-13 NOTE — Assessment & Plan Note (Signed)
 CT chest reviewed.  Left upper lobe nodule is stable, suspect this is an area of scarring postinfection.  Plan to continue appropriate surveillance especially since he continues to smoke.  Next scan to be done at the 1 year mark

## 2024-05-01 ENCOUNTER — Ambulatory Visit: Admitting: Nurse Practitioner

## 2024-05-24 ENCOUNTER — Encounter: Admitting: Physical Medicine and Rehabilitation

## 2024-05-29 ENCOUNTER — Ambulatory Visit

## 2024-05-29 ENCOUNTER — Ambulatory Visit (HOSPITAL_COMMUNITY)

## 2024-07-18 ENCOUNTER — Ambulatory Visit: Admitting: Cardiology
